# Patient Record
Sex: Male | Born: 1945 | Race: Black or African American | Hispanic: No | Smoking: Former smoker
Health system: Southern US, Community
[De-identification: ages and names within clinical notes are randomized; demographics above are authoritative.]

## PROBLEM LIST (undated history)

## (undated) DIAGNOSIS — I480 Paroxysmal atrial fibrillation: Secondary | ICD-10-CM

## (undated) DIAGNOSIS — I959 Hypotension, unspecified: Secondary | ICD-10-CM

## (undated) DIAGNOSIS — Z86718 Personal history of other venous thrombosis and embolism: Secondary | ICD-10-CM

## (undated) DIAGNOSIS — Z8719 Personal history of other diseases of the digestive system: Secondary | ICD-10-CM

## (undated) DIAGNOSIS — I739 Peripheral vascular disease, unspecified: Secondary | ICD-10-CM

## (undated) DIAGNOSIS — J9601 Acute respiratory failure with hypoxia: Secondary | ICD-10-CM

## (undated) DIAGNOSIS — Z9114 Patient's other noncompliance with medication regimen: Secondary | ICD-10-CM

## (undated) DIAGNOSIS — M2550 Pain in unspecified joint: Secondary | ICD-10-CM

## (undated) DIAGNOSIS — I4892 Unspecified atrial flutter: Secondary | ICD-10-CM

## (undated) DIAGNOSIS — I5022 Chronic systolic (congestive) heart failure: Secondary | ICD-10-CM

## (undated) DIAGNOSIS — I639 Cerebral infarction, unspecified: Secondary | ICD-10-CM

## (undated) DIAGNOSIS — H269 Unspecified cataract: Secondary | ICD-10-CM

## (undated) DIAGNOSIS — Z91148 Patient's other noncompliance with medication regimen for other reason: Secondary | ICD-10-CM

## (undated) DIAGNOSIS — N189 Chronic kidney disease, unspecified: Secondary | ICD-10-CM

## (undated) DIAGNOSIS — I219 Acute myocardial infarction, unspecified: Secondary | ICD-10-CM

## (undated) DIAGNOSIS — I82409 Acute embolism and thrombosis of unspecified deep veins of unspecified lower extremity: Secondary | ICD-10-CM

## (undated) DIAGNOSIS — IMO0001 Reserved for inherently not codable concepts without codable children: Secondary | ICD-10-CM

## (undated) DIAGNOSIS — G629 Polyneuropathy, unspecified: Secondary | ICD-10-CM

## (undated) DIAGNOSIS — I255 Ischemic cardiomyopathy: Secondary | ICD-10-CM

## (undated) DIAGNOSIS — I2510 Atherosclerotic heart disease of native coronary artery without angina pectoris: Secondary | ICD-10-CM

## (undated) DIAGNOSIS — J9602 Acute respiratory failure with hypercapnia: Secondary | ICD-10-CM

## (undated) DIAGNOSIS — E785 Hyperlipidemia, unspecified: Secondary | ICD-10-CM

## (undated) HISTORY — PX: OTHER SURGICAL HISTORY: SHX169

## (undated) HISTORY — DX: Chronic systolic (congestive) heart failure: I50.22

## (undated) HISTORY — DX: Acute myocardial infarction, unspecified: I21.9

## (undated) HISTORY — PX: ADENOIDECTOMY: SUR15

## (undated) HISTORY — PX: TONSILLECTOMY: SUR1361

## (undated) HISTORY — PX: HERNIA REPAIR: SHX51

## (undated) HISTORY — DX: Unspecified cataract: H26.9

## (undated) HISTORY — DX: Paroxysmal atrial fibrillation: I48.0

## (undated) HISTORY — PX: EYE SURGERY: SHX253

## (undated) HISTORY — PX: CARDIAC CATHETERIZATION: SHX172

## (undated) HISTORY — DX: Cerebral infarction, unspecified: I63.9

## (undated) HISTORY — DX: Hyperlipidemia, unspecified: E78.5

---

## 2000-03-08 HISTORY — PX: CORONARY ARTERY BYPASS GRAFT: SHX141

## 2000-04-08 HISTORY — PX: CORONARY ANGIOPLASTY: SHX604

## 2009-04-08 DIAGNOSIS — I82409 Acute embolism and thrombosis of unspecified deep veins of unspecified lower extremity: Secondary | ICD-10-CM

## 2009-04-08 HISTORY — DX: Acute embolism and thrombosis of unspecified deep veins of unspecified lower extremity: I82.409

## 2010-03-16 ENCOUNTER — Inpatient Hospital Stay (HOSPITAL_COMMUNITY)
Admission: EM | Admit: 2010-03-16 | Discharge: 2010-03-25 | Payer: Self-pay | Source: Home / Self Care | Attending: Internal Medicine | Admitting: Internal Medicine

## 2010-03-19 HISTORY — PX: CARDIAC CATHETERIZATION: SHX172

## 2010-03-28 ENCOUNTER — Ambulatory Visit: Payer: Self-pay | Admitting: Cardiology

## 2010-04-13 DIAGNOSIS — I2510 Atherosclerotic heart disease of native coronary artery without angina pectoris: Secondary | ICD-10-CM | POA: Insufficient documentation

## 2010-04-13 DIAGNOSIS — I480 Paroxysmal atrial fibrillation: Secondary | ICD-10-CM | POA: Insufficient documentation

## 2010-04-13 DIAGNOSIS — I428 Other cardiomyopathies: Secondary | ICD-10-CM | POA: Insufficient documentation

## 2010-04-16 ENCOUNTER — Encounter: Payer: Self-pay | Admitting: Cardiology

## 2010-04-16 ENCOUNTER — Encounter (INDEPENDENT_AMBULATORY_CARE_PROVIDER_SITE_OTHER): Payer: Self-pay | Admitting: *Deleted

## 2010-04-16 ENCOUNTER — Ambulatory Visit
Admission: RE | Admit: 2010-04-16 | Discharge: 2010-04-16 | Payer: Self-pay | Source: Home / Self Care | Attending: Cardiology | Admitting: Cardiology

## 2010-04-16 DIAGNOSIS — E119 Type 2 diabetes mellitus without complications: Secondary | ICD-10-CM | POA: Insufficient documentation

## 2010-04-16 DIAGNOSIS — M25569 Pain in unspecified knee: Secondary | ICD-10-CM | POA: Insufficient documentation

## 2010-04-25 ENCOUNTER — Ambulatory Visit: Admission: RE | Admit: 2010-04-25 | Discharge: 2010-04-25 | Payer: Self-pay | Source: Home / Self Care

## 2010-05-03 ENCOUNTER — Other Ambulatory Visit: Payer: Self-pay | Admitting: Internal Medicine

## 2010-05-03 ENCOUNTER — Ambulatory Visit
Admission: RE | Admit: 2010-05-03 | Discharge: 2010-05-03 | Payer: Self-pay | Source: Home / Self Care | Attending: Internal Medicine | Admitting: Internal Medicine

## 2010-05-03 DIAGNOSIS — E1165 Type 2 diabetes mellitus with hyperglycemia: Secondary | ICD-10-CM | POA: Insufficient documentation

## 2010-05-03 DIAGNOSIS — E118 Type 2 diabetes mellitus with unspecified complications: Secondary | ICD-10-CM

## 2010-05-03 LAB — BASIC METABOLIC PANEL
CO2: 26 mEq/L (ref 19–32)
Calcium: 9.4 mg/dL (ref 8.4–10.5)
Creatinine, Ser: 1.2 mg/dL (ref 0.4–1.5)
Glucose, Bld: 234 mg/dL — ABNORMAL HIGH (ref 70–99)

## 2010-05-03 NOTE — Discharge Summary (Addendum)
NAME:  Leroy [name], Leroy [name] NO.:  [id number]  MEDICAL RECORD NO.:  [id number]          PATIENT TYPE:  INP  LOCATION:  3728                         FACILITY:  [facility]  PHYSICIAN:  Rollene Rotunda, MD, FACCDATE OF BIRTH:  05-06-1945  DATE OF ADMISSION:  03/16/2010 DATE OF DISCHARGE:  03/25/2010                              DISCHARGE SUMMARY   PRIMARY CARDIOLOGIST:  Rollene Rotunda, MD, [facility]  PRIMARY CARE PHYSICIAN:  Currently none.  DISCHARGE DIAGNOSES: 1. Paroxysmal atrial fibrillation. 2. Chronic anticoagulation. 3. Coronary artery disease, status post coronary artery bypass     grafting approximately 10 years ago.     a.     Left internal mammary artery to left anterior descending,      saphenous vein graft to the obtuse marginal, saphenous vein graft      to right coronary artery.     b.     Cardiac catheterization on March 20, 2010, demonstrating      severe native coronary artery disease.  There are patent grafts to      the left anterior descending and circumflex artery.  The right      coronary artery occluded and fills the collateral vessels. 4. Ischemic cardiomyopathy, ejection fraction around 40% per     catheterization on March 20, 2010.  ALLERGIES:  KEFLEX causing hives.  PROCEDURES/DIAGNOSTICS PERFORMED DURING HOSPITALIZATION: 1. Left heart catheterization with coronary angiography.     a.     Severe native coronary artery disease.     b.     Patent grafts to the LAD and circumflex artery.     c.     Total occlusion of the right coronary artery that fills the      collateral vessels.  There is presumed graft to the RCA at one      point, but it is not occluded.     d.     Left ventriculogram demonstrating moderate left ventricular      dysfunction, EF 40%.  There is inferior wall akinesis.  No      significant mitral regurgitation. 2. Chest x-ray showing borderline cardiomegaly, no active lung     disease.  HISTORY OF PRESENT ILLNESS:  This  is a 65 year old gentleman who had recently been released from incarceration several months ago and has had no followup since this time.  He states he was in his usual state of health until he suddenly became diaphoretic and felt pressure in his chest.  This lasted for approximately an hour until he presented to the emergency department.  In the emergency department, his initial cardiac markers were elevated.  With the patient's history and elevated enzymes, the patient was admitted for non-ST-elevation myocardial infarction.  HOSPITAL COURSE:  The patient was placed on IV heparin, continued with aspirin, beta-blocker, and ACE inhibitor.  The patient's chest pain had resolved, but cardiac catheterization was indicated.  Risks, benefits, and indications were discussed with the patient and he agreed to proceed.  Dr. Elease Hashimoto took the patient to the Cath Lab on March 20, 2010, where informed consent was obtained.  As stated above, there was  severe native coronary artery disease with patent graft to the LAD and circumflex artery.  There was total occlusion of the RCA and presumed occlusion of the graft to the RCA.  Medical therapy was continued.  The patient had no further complaints of chest pain during hospitalization.  The patient did experience episodes of intermittent atrial fibrillation. He was continued on heparin as well as Coumadin until his INR was therapeutic.  The patient's INR was 2.08 on the day of discharge, he will be continued on chronic anticoagulation.  The patient will have followup in our office for INR draws.  The patient was noted to have ischemic cardiomyopathy per catheterization with an ejection fraction of 40%.  There was  inferior wall akinesis.  The patient was continued on aspirin, beta-blocker, and ACE inhibitors.  We are unable to titrate the Foley secondary to low blood pressures.  Medical compliance has been stressed to the patient, he  voices understanding.  His hemoglobin A1c was noted to be 12.0%.  The patient has had no followup since he was released from jail.  He is not checking his sugars regularly at home.  The patient's diabetes counselor did speak with the patient about the importance of diet as well as checking sugars regularly.  The patient has been given a number for [facility] as they are not taking new patients at this time.  He will call to make an appointment for followup of his diabetes.  On the day of discharge, the patient was in normal sinus rhythm without complaints of chest pain or shortness of breath.  His INR was therapeutic.  The patient was stable for home.  DISCHARGE LABS:  WBC 6, hemoglobin 13.8, hematocrit 40.7, and platelets 171.  INR 2.08.  Max troponin was 2.88.  DISCHARGE MEDICATIONS: 1. Aspirin 325 mg daily. 2. Doxycycline daily. 3. Crestor 40 mg daily. 4. Lasix 40 mg daily. 5. Lisinopril 20 mg daily. 6. Metoprolol tartrate 25 mg twice daily. 7. Nitroglycerin 0.4 mg sublingual as needed every 5 minutes up to 3     doses for chest pain. 8. Coumadin 5 mg use as directed. 9. Novolin N 30 units subcutaneously twice daily. 10.Novolin R 10 units subcutaneously twice daily.  FOLLOWUP PLAN: 1. The patient will follow up with Dr. Antoine Poche in the next 2-3 weeks,     our office will call to schedule this appointment. 2. The patient will follow up in the Coumadin Clinic in 4 days, our     office will call to schedule this appointment. 3. The patient will call to schedule an appointment with [facility]     for diabetes management. 4. The patient is to increase activity as tolerated.  He is to avoid     any activities that causes chest pain or shortness of breath. 5. The patient is to continue a low-sodium, heart-healthy, diabetic     diet. 6. The patient is to call our office for any problems.  DURATION OF DISCHARGE:  Greater than 30 minutes with physician and physician extender  time.     Leonette Monarch, PA-C   ______________________________ Rollene Rotunda, MD, [facility]    NB/MEDQ  D:  03/25/2010  T:  03/25/2010  Job:  [id number]  Electronically Signed by Alen Blew P.A. on 04/17/2010 07:47:03 PM Electronically Signed by Rollene Rotunda MD [facility] on 05/03/2010 12:15:47 PM

## 2010-05-09 ENCOUNTER — Ambulatory Visit: Admit: 2010-05-09 | Payer: Self-pay

## 2010-05-10 NOTE — Medication Information (Signed)
Summary: [email or web address]  Anticoagulant Therapy  Managed by: Geoffry Paradise, PharmD Referring MD: Su Monks MD: Shirlee Latch MD, Freida Busman Indication 1: Atrial Fibrillation INR RANGE 2-3  Dietary changes: no    Health status changes: no    Bleeding/hemorrhagic complications: no    Recent/future hospitalizations: no    Any changes in medication regimen? no    Recent/future dental: no  Any missed doses?: no       Is patient compliant with meds? yes      Comments: Patient recently discharged from the hospital (12/18) on coumadin 7.5 mg MWF and 10 mg other days of the week for atrial fibrillation.  Patient was previously on Coumadin for 7 years then discontinued for 1 week before restart at recent admission.    Current Medications (verified): 1)  Ecotrin 325 Mg Tbec (Aspirin) .... Once Daily 2)  Crestor 40 Mg Tabs (Rosuvastatin Calcium) .... Once Nightly 3)  Lasix 40 Mg Tabs (Furosemide) .... Once Daily in Am 4)  Prinivil 20 Mg Tabs (Lisinopril) .... Once Daily 5)  Lopressor 50 Mg Tabs (Metoprolol Tartrate) .[name].. 1 Tablet Twice Daily 6)  Coumadin 5 Mg Tabs (Warfarin Sodium) .... As Directed 7)  Novolin N 100 Unit/ml Susp (Insulin Isophane Human) .... 30 Units Twice Daily 8)  Novolin R 100 Unit/ml Soln (Insulin Regular Human) .[name].. 10 Units Twice Daily 9)  Nitrostat 0.4 Mg Subl (Nitroglycerin) .... As Directed  Allergies (verified): 1)  ! Cephalexin 2)  ! Norvasc  Anticoagulation Management History:      Negative risk factors for bleeding include an age less than 68 years old.  The bleeding index is 'low risk'.  Negative CHADS2 values include Age > 52 years old.  Anticoagulation responsible Caressa Scearce: Shirlee Latch MD, [place].  Cuvette Lot#: [id number].  Exp: 05/2011.    Anticoagulation Management Assessment/Plan:      The patient's current anticoagulation dose is Coumadin 5 mg tabs: as directed.  The target INR is 2.0-3.0.  The next INR is due 04/25/2010.  Results were reviewed/authorized by  Geoffry Paradise, PharmD.         Current Anticoagulation Instructions: INR:  2.8  Your INR is at goal today.  Please continue to take your coumadin at 7.5 mg MWF and 10 mg on other days of the week.  Return to clinic in 4 weeks for another INR check.   Prescriptions: NITROSTAT 0.4 MG SUBL (NITROGLYCERIN) as directed  #1 x 1   Entered by:   Geoffry Paradise PharmD   Authorized by:   [name] LB CHURCHDEFAULT   Signed by:   Geoffry Paradise PharmD on 03/28/2010   Method used:   Historical   RxID:   [id number] NOVOLIN R 100 UNIT/ML SOLN (INSULIN REGULAR HUMAN) 10 units twice daily  #1 x 1   Entered by:   Geoffry Paradise PharmD   Authorized by:   [name] LB CHURCHDEFAULT   Signed by:   Geoffry Paradise PharmD on 03/28/2010   Method used:   Historical   RxID:   [id number] NOVOLIN N 100 UNIT/ML SUSP (INSULIN ISOPHANE HUMAN) 30 units twice daily  #1 x 1   Entered by:   Geoffry Paradise PharmD   Authorized by:   [name] LB CHURCHDEFAULT   Signed by:   Geoffry Paradise PharmD on 03/28/2010   Method used:   Historical   RxID:   [id number] LOPRESSOR 50 MG TABS (METOPROLOL TARTRATE) 1 tablet twice daily  #1 x 1   Entered by:   Geoffry Paradise PharmD  Authorized by:   [name] LB CHURCHDEFAULT   Signed by:   Geoffry Paradise PharmD on 03/28/2010   Method used:   Historical   RxID:   [id number] PRINIVIL 20 MG TABS (LISINOPRIL) Once daily  #1 x 1   Entered by:   Geoffry Paradise PharmD   Authorized by:   [name] LB CHURCHDEFAULT   Signed by:   Geoffry Paradise PharmD on 03/28/2010   Method used:   Historical   RxID:   [id number] LASIX 40 MG TABS (FUROSEMIDE) Once daily in AM  #1 x 1   Entered by:   Geoffry Paradise PharmD   Authorized by:   [name] LB CHURCHDEFAULT   Signed by:   Geoffry Paradise PharmD on 03/28/2010   Method used:   Historical   RxID:   [id number] CRESTOR 40 MG TABS (ROSUVASTATIN CALCIUM) once nightly  #1 x 1   Entered by:   Geoffry Paradise PharmD   Authorized by:   [name] LB CHURCHDEFAULT   Signed by:   Geoffry Paradise PharmD  on 03/28/2010   Method used:   Historical   RxID:   [id number] ECOTRIN 325 MG TBEC (ASPIRIN) Once daily  #1 x 1   Entered by:   Geoffry Paradise PharmD   Authorized by:   [name] LB CHURCHDEFAULT   Signed by:   Geoffry Paradise PharmD on 03/28/2010   Method used:   Historical   RxID:   [id number]

## 2010-05-10 NOTE — Assessment & Plan Note (Signed)
Summary: NEW/ NO INSURANCE/ ASKED TO BRING $184/ NWS #   Vital Signs:  Patient profile:   65 year old male Height:      69 inches Weight:      311.50 pounds BMI:     46.17 O2 Sat:      97 % on Room air Temp:     98.6 degrees F oral Pulse rate:   70 / minute Pulse rhythm:   regular Resp:     16 per minute BP sitting:   94 / 68  (left arm) Cuff size:   large  Vitals Entered By: Rock Nephew CMA (May 03, 2010 10:29 AM)  Nutrition Counseling: Patient's BMI is greater than 25 and therefore counseled on weight management options.  O2 Flow:  Room air CC: New to establish Is Patient Diabetic? Yes Did you bring your meter with you today? No Pain Assessment Patient in pain? no       Does patient need assistance? Functional Status Self care Ambulation Normal   Primary Care Provider:  Etta Grandchild MD  CC:  New to establish.  History of Present Illness: New to me for evaluation of diabetes. He tells me that his A1C was about 11% six weeks ago when he was in the hospital. He has had DM for a long while and says that he has not been on oral meds b/c he has been in prison for 10 years and he says oral meds were not offered to him. All the info indicates that he has Type DM and would benefit from being on some oral meds in addition to insulin.  Preventive Screening-Counseling & Management  Alcohol-Tobacco     Alcohol drinks/day: 0     Alcohol Counseling: not indicated; patient does not drink     Smoking Status: quit > 6 months     Tobacco Counseling: to remain off tobacco products  Caffeine-Diet-Exercise     Does Patient Exercise: no  Hep-HIV-STD-Contraception     Hepatitis Risk: no risk noted     HIV Risk: no risk noted     STD Risk: no risk noted      Sexual History:  currently monogamous.        Drug Use:  no.        Blood Transfusions:  no.    Current Medications (verified): 1)  Ecotrin 325 Mg Tbec (Aspirin) .... Once Daily 2)  Crestor 40 Mg Tabs  (Rosuvastatin Calcium) .... Once Nightly 3)  Lasix 40 Mg Tabs (Furosemide) .... Once Daily in Am 4)  Prinivil 20 Mg Tabs (Lisinopril) .... Once Daily 5)  Toprol Xl 25 Mg Xr24h-Tab (Metoprolol Succinate) .[name].. 1 Po Two Times A Day 6)  Coumadin 5 Mg Tabs (Warfarin Sodium) .... As Directed 7)  Novolin N 100 Unit/ml Susp (Insulin Isophane Human) .... 30 Units Twice Daily 8)  Novolin R 100 Unit/ml Soln (Insulin Regular Human) .[name].. 10 Units Twice Daily 9)  Nitrostat 0.4 Mg Subl (Nitroglycerin) .... As Directed  Allergies: 1)  ! Cephalexin 2)  ! Norvasc 3)  ! Amlodipine Besylate  Past History:  Past Medical History: Last updated: 04/16/2010  1. Paroxysmal atrial fibrillation.   2. Chronic anticoagulation.   3. Coronary artery disease, status post coronary artery bypass       grafting approximately 10 years ago.       a.     Left internal mammary artery to left anterior descending,        saphenous vein graft  to the obtuse marginal, saphenous vein graft        to right coronary artery.       b.     Cardiac catheterization on March 20, 2010, demonstrating        severe native coronary artery disease.  There are patent grafts to        the left anterior descending and circumflex artery.  The right        coronary artery occluded and fills the collateral vessels.   4. Ischemic cardiomyopathy, ejection fraction around 40% per       catheterization on March 20, 2010.   5. Diabetes  Past Surgical History: Last updated: 04/16/2010 CABG Lower extremity revascularization (details not available)  Family History: Last updated: 04/13/2010  Notable for both parents having coronary artery   disease.  He does not know other siblings history.      Social History: Last updated: 05/03/2010  The patient has a history of tobacco abuse in the past.   He denies current smoking.  He denies alcohol abuse.  He has a history   of drug use.  He denies all that currently and appears well.       Occupation: disabled, recent incarceration for drug trafficking Single Alcohol use-no Drug use-no Regular exercise-no  Risk Factors: Alcohol Use: 0 (05/03/2010) Exercise: no (05/03/2010)  Risk Factors: Smoking Status: quit > 6 months (05/03/2010)  Social History:  The patient has a history of tobacco abuse in the past.   He denies current smoking.  He denies alcohol abuse.  He has a history   of drug use.  He denies all that currently and appears well.     Occupation: disabled, recent incarceration for drug trafficking Single Alcohol use-no Drug use-no Regular exercise-no Smoking Status:  quit > 6 months Hepatitis Risk:  no risk noted HIV Risk:  no risk noted STD Risk:  no risk noted Sexual History:  currently monogamous Blood Transfusions:  no Drug Use:  no Does Patient Exercise:  no  Review of Systems       The patient complains of weight gain.  The patient denies anorexia, fever, weight loss, chest pain, syncope, dyspnea on exertion, peripheral edema, prolonged cough, headaches, hemoptysis, abdominal pain, hematuria, depression, and enlarged lymph nodes.   Endo:  Denies cold intolerance, excessive hunger, excessive thirst, excessive urination, heat intolerance, polyuria, and weight change.  Physical Exam  General:  alert, well-developed, well-nourished, well-hydrated, appropriate dress, normal appearance, healthy-appearing, cooperative to examination, and overweight-appearing.   Head:  normocephalic and atraumatic.   Eyes:  vision grossly intact, pupils equal, and no injection.   Ears:  R ear normal and L ear normal.   Nose:  External nasal examination shows no deformity or inflammation. Nasal mucosa are pink and moist without lesions or exudates. Mouth:  Oral mucosa and oropharynx without lesions or exudates.  Teeth in good repair. Neck:  supple, full ROM, no masses, no thyromegaly, no thyroid nodules or tenderness, no JVD, normal carotid upstroke, and no carotid  bruits.   Lungs:  normal respiratory effort, no intercostal retractions, no accessory muscle use, normal breath sounds, no dullness, no fremitus, no crackles, and no wheezes.   Heart:  normal rate, regular rhythm, no murmur, no gallop, no rub, and no JVD.   Abdomen:  soft, non-tender, normal bowel sounds, no distention, no masses, no guarding, no rigidity, no rebound tenderness, no abdominal hernia, no inguinal hernia, no hepatomegaly, and no splenomegaly.   Msk:  No deformity or scoliosis noted of thoracic or lumbar spine.   Pulses:  R and L carotid,radial,femoral,dorsalis pedis and posterior tibial pulses are full and equal bilaterally Extremities:  No clubbing or cyanosis.trace left pedal edema and trace right pedal edema.   Neurologic:  No cranial nerve deficits noted. Station and gait are normal. Plantar reflexes are down-going bilaterally. DTRs are symmetrical throughout. Sensory, motor and coordinative functions appear intact. Skin:  turgor normal, color normal, no rashes, no suspicious lesions, no ecchymoses, no petechiae, no purpura, no ulcerations, and no edema.   Cervical Nodes:  no anterior cervical adenopathy.   Axillary Nodes:  no R axillary adenopathy and no L axillary adenopathy.   Psych:  Oriented X3, memory intact for recent and remote, normally interactive, good eye contact, not anxious appearing, not depressed appearing, not agitated, not suicidal, and not homicidal.    Diabetes Management Exam:    Foot Exam (with socks and/or shoes not present):       Sensory-Pinprick/Light touch:          Left medial foot (L-4): normal          Left dorsal foot (L-5): normal          Left lateral foot (S-1): normal          Right medial foot (L-4): normal          Right dorsal foot (L-5): normal          Right lateral foot (S-1): normal       Sensory-Monofilament:          Left foot: normal          Right foot: normal       Inspection:          Left foot: normal          Right foot:  normal       Nails:          Left foot: normal          Right foot: normal   Impression & Recommendations:  Problem # 1:  DIABETES MELLITUS, TYPE II, UNCONTROLLED (ICD-250.02) Assessment New  His updated medication list for this problem includes:    Ecotrin 325 Mg Tbec (Aspirin) ..... Once daily    Prinivil 20 Mg Tabs (Lisinopril) ..... Once daily    Novolin N 100 Unit/ml Susp (Insulin isophane human) .Marland KitchenMarland KitchenMarland KitchenMarland Kitchen 30 units twice daily    Novolin R 100 Unit/ml Soln (Insulin regular human) .Marland KitchenMarland KitchenMarland KitchenMarland Kitchen 10 units twice daily    Kombiglyze Xr 08-998 Mg Xr24h-tab (Saxagliptin-metformin) ..... One by mouth once daily for diabetes  Orders: Diabetic Clinic Referral (Diabetic) Ophthalmology Referral (Ophthalmology) Venipuncture [phone]) TLB-BMP (Basic Metabolic Panel-BMET) (80048-METABOL) TLB-A1C / Hgb A1C (Glycohemoglobin) (83036-A1C)  Complete Medication List: 1)  Ecotrin 325 Mg Tbec (Aspirin) .... Once daily 2)  Crestor 40 Mg Tabs (Rosuvastatin calcium) .... Once nightly 3)  Lasix 40 Mg Tabs (Furosemide) .... Once daily in am 4)  Prinivil 20 Mg Tabs (Lisinopril) .... Once daily 5)  Toprol Xl 25 Mg Xr24h-tab (Metoprolol succinate) .[name].. 1 po two times a day 6)  Coumadin 5 Mg Tabs (Warfarin sodium) .... As directed 7)  Novolin N 100 Unit/ml Susp (Insulin isophane human) .... 30 units twice daily 8)  Novolin R 100 Unit/ml Soln (Insulin regular human) .[name].. 10 units twice daily 9)  Nitrostat 0.4 Mg Subl (Nitroglycerin) .... As directed 10)  Kombiglyze Xr 08-998 Mg Xr24h-tab (Saxagliptin-metformin) .... One by mouth once daily for diabetes  11)  Bayer Contour Test Strp (Glucose blood) .... Use two times a day as directed 12)  Designer, multimedia W/device Kit (Blood glucose monitoring suppl) .... Use two times a day as directed  Other Orders: Tdap => 87yrs IM (04540) Admin 1st Vaccine (98119)  Patient Instructions: 1)  Please schedule a follow-up appointment in 3 months. 2)  It is important that you  exercise regularly at least 20 minutes 5 times a week. If you develop chest pain, have severe difficulty breathing, or feel very tired , stop exercising immediately and seek medical attention. 3)  You need to lose weight. Consider a lower calorie diet and regular exercise.  4)  Check your blood sugars regularly. If your readings are usually above 200 or below 70 you should contact our office. 5)  It is important that your Diabetic A1c level is checked every 3 months. 6)  See your eye doctor yearly to check for diabetic eye damage. 7)  Check your feet each night for sore areas, calluses or signs of infection. 8)  Check your Blood Pressure regularly. If it is above: you should make an appointment. Prescriptions: BAYER CONTOUR MONITOR W/DEVICE KIT (BLOOD GLUCOSE MONITORING SUPPL) Use two times a day as directed  #1 x 0   Entered and Authorized by:   Etta Grandchild MD   Signed by:   Etta Grandchild MD on 05/03/2010   Method used:   Samples Given   RxID:   [id number] BAYER CONTOUR TEST  STRP (GLUCOSE BLOOD) Use two times a day as directed  #250 x 0   Entered and Authorized by:   Etta Grandchild MD   Signed by:   Etta Grandchild MD on 05/03/2010   Method used:   Samples Given   RxID:   [id number] KOMBIGLYZE XR 08-998 MG XR24H-TAB (SAXAGLIPTIN-METFORMIN) One by mouth once daily for diabetes  #112 x 0   Entered and Authorized by:   Etta Grandchild MD   Signed by:   Etta Grandchild MD on 05/03/2010   Method used:   Samples Given   RxID:   [id number]    Orders Added: 1)  Diabetic Clinic Referral [Diabetic] 2)  Ophthalmology Referral [Ophthalmology] 3)  Venipuncture [42595] 4)  TLB-BMP (Basic Metabolic Panel-BMET) [80048-METABOL] 5)  TLB-A1C / Hgb A1C (Glycohemoglobin) [83036-A1C] 6)  Tdap => 39yrs IM [90715] 7)  Admin 1st Vaccine [90471] 8)  New Patient Level III [63875]   Immunization History:  Influenza Immunization History:    Influenza:  fluvax 3+  (03/17/2010)  Pneumovax Immunization History:    Pneumovax:  pneumovax (03/17/2010)  Immunizations Administered:  Tetanus Vaccine:    Vaccine Type: Tdap    Site: left deltoid    Mfr: GlaxoSmithKline    Dose: 0.5 ml    Route: IM    Given by: Lamar Sprinkles, CMA    Exp. Date: 01/26/2012    Lot #: [id number]    VIS given: 02/24/08 version given May 03, 2010.   Immunization History:  Influenza Immunization History:    Influenza:  Fluvax 3+ (03/17/2010)  Pneumovax Immunization History:    Pneumovax:  Pneumovax (03/17/2010)  Immunizations Administered:  Tetanus Vaccine:    Vaccine Type: Tdap    Site: left deltoid    Mfr: GlaxoSmithKline    Dose: 0.5 ml    Route: IM    Given by: Lamar Sprinkles, CMA    Exp. Date: 01/26/2012    Lot #: [id number]    VIS given:  02/24/08 version given May 03, 2010.

## 2010-05-10 NOTE — Letter (Signed)
Summary: Generic Letter  Architectural technologist, Main Office  [address]   [place], [place] [address]   Phone: [phone]  Fax: [phone]        April 16, 2010 MRN: [id number]    Leroy [name] [address] [place], [place]  [address]    Dear [name],    Dr. Antoine Poche has referred to the orthopedic doctor.  You have an appointment with Dr. Farris Has on 05/16/10 @ 11am. Please arrive at 10:45 to register.  The office is location is [address]. If you need to cancel or reschedule your appointment, please call [phone].       Sincerely,   Merita Norton Lloyd-Fate

## 2010-05-10 NOTE — Assessment & Plan Note (Signed)
Summary: [email or web address]   Visit Type:  Follow-up Primary Provider:  None  CC:  CAD and Atrial fibrillation.  History of Present Illness: The pateint presents for followup after recent hospitalization for management of chest pain and atrial fibrillation. Since that hospitalization he has been doing well. He denies any chest discomfort, neck or arm discomfort. He has been doing some yard work note he hasn't been exercising. He is somewhat limited by chronic knee pain. He has not noticed any palpitations, presyncope or syncope. He has had no new shortness of breath, PND or orthopnea. He has been checking his blood sugars and says that they are controlled.   Current Medications (verified): 1)  Ecotrin 325 Mg Tbec (Aspirin) .... Once Daily 2)  Crestor 40 Mg Tabs (Rosuvastatin Calcium) .... Once Nightly 3)  Lasix 40 Mg Tabs (Furosemide) .... Once Daily in Am 4)  Prinivil 20 Mg Tabs (Lisinopril) .... Once Daily 5)  Toprol Xl 25 Mg Xr24h-Tab (Metoprolol Succinate) .[name].. 1 Po Two Times A Day 6)  Coumadin 5 Mg Tabs (Warfarin Sodium) .... As Directed 7)  Novolin N 100 Unit/ml Susp (Insulin Isophane Human) .... 30 Units Twice Daily 8)  Novolin R 100 Unit/ml Soln (Insulin Regular Human) .[name].. 10 Units Twice Daily 9)  Nitrostat 0.4 Mg Subl (Nitroglycerin) .... As Directed  Allergies (verified): 1)  ! Cephalexin 2)  ! Norvasc  Past History:  Past Medical History:  1. Paroxysmal atrial fibrillation.   2. Chronic anticoagulation.   3. Coronary artery disease, status post coronary artery bypass       grafting approximately 10 years ago.       a.     Left internal mammary artery to left anterior descending,        saphenous vein graft to the obtuse marginal, saphenous vein graft        to right coronary artery.       b.     Cardiac catheterization on March 20, 2010, demonstrating        severe native coronary artery disease.  There are patent grafts to        the left anterior descending and circumflex  artery.  The right        coronary artery occluded and fills the collateral vessels.   4. Ischemic cardiomyopathy, ejection fraction around 40% per       catheterization on March 20, 2010.   5. Diabetes  Past Surgical History: CABG Lower extremity revascularization (details not available)  Review of Systems       As stated in the HPI and negative for all other systems.   Vital Signs:  Patient profile:   65 year old male Height:      69 inches Weight:      311 pounds BMI:     46.09 Pulse rate:   74 / minute Resp:     16 per minute BP sitting:   112 /   (right arm)  Vitals Entered By: Marrion Coy, CNA (April 16, 2010 11:16 AM)  Physical Exam  General:  Well developed, well nourished, in no acute distress. Head:  normocephalic and atraumatic Eyes:  PERRLA/EOM intact; conjunctiva and lids normal. Neck:  Neck supple, no JVD. No masses, thyromegaly or abnormal cervical nodes. Chest Wall:  Well-healed sternotomy scar Lungs:  Clear bilaterally to auscultation and percussion. Abdomen:  Bowel sounds positive; abdomen soft and non-tender without masses, organomegaly, or hernias noted. No hepatosplenomegaly, obese Msk:  Back normal, normal gait. Muscle  strength and tone normal. Extremities:  No clubbing or cyanosis. Neurologic:  Alert and oriented x 3. Skin:  Intact without lesions or rashes. Cervical Nodes:  no significant adenopathy Axillary Nodes:  no significant adenopathy Psych:  Normal affect.   Detailed Cardiovascular Exam  Neck    Carotids: Carotids full and equal bilaterally without bruits.      Neck Veins: Normal, no JVD.    Heart    Inspection: no deformities or lifts noted.      Palpation: normal PMI with no thrills palpable.      Auscultation: regular rate and rhythm, S1, S2 without murmurs, rubs, gallops, or clicks.    Vascular    Abdominal Aorta: no palpable masses, pulsations, or audible bruits.      Femoral Pulses: normal femoral pulses  bilaterally.      Pedal Pulses: normal pedal pulses bilaterally.      Radial Pulses: normal radial pulses bilaterally.      Peripheral Circulation: no clubbing, cyanosis, or edema noted with normal capillary refill.     EKG  Procedure date:  04/16/2010  Findings:      Sinus rhythm, rate 75, axis within normal limits, intervals within normal limits, lateral T wave inversions  Impression & Recommendations:  Problem # 1:  CAD (ICD-414.00) He is having no new symptoms and we will continue with risk reduction. Orders: EKG w/ Interpretation (93000)  Problem # 2:  KNEE PAIN (VWU-981.19) I have referred him for orthopedic consultation Orders: Orthopedic Referral (Ortho)  Problem # 3:  ATRIAL FIBRILLATION, PAROXYSMAL (ICD-427.31) He has had no symptomatic paroxysms. He will continue with anticoagulation given his elevated thromboembolic risk score. His updated medication list for this problem includes:    Ecotrin 325 Mg Tbec (Aspirin) ..... Once daily    Toprol Xl 25 Mg Xr24h-tab (Metoprolol succinate) .[name]... 1 po two times a day    Coumadin 5 Mg Tabs (Warfarin sodium) .[name]... As directed  Orders: EKG w/ Interpretation (93000) Misc. Referral (Misc. Ref)  Problem # 4:  CARDIOMYOPATHY (ICD-425.4) His blood pressure will not allow further med titration. He has class I symptoms at this point. Orders: Misc. Referral (Misc. Ref)  Problem # 5:  DM (ICD-250.00) We are working to get him established with a primary provider who accepts indigent care.  Patient Instructions: 1)  Your physician recommends that you schedule a follow-up appointment in: 6 months with Tereso Newcomer, PA 2)  Your physician recommends that you continue on your current medications as directed. Please refer to the Current Medication list given to you today. 3)  You have been referred to orthopedics for your right  knee pain and to primary care to be established.

## 2010-05-10 NOTE — Medication Information (Signed)
Summary: CCR/tp  Anticoagulant Therapy  Managed by: Geoffry Paradise, PharmD Referring MD: Antoine Poche PCP: None Supervising MD: Shirlee Latch MD, Essance Gatti Indication 1: Atrial Fibrillation INR POC 1.6 INR RANGE 2-3  Dietary changes: no    Health status changes: no    Bleeding/hemorrhagic complications: no    Recent/future hospitalizations: no    Any changes in medication regimen? no    Recent/future dental: no  Any missed doses?: no       Is patient compliant with meds? yes       Allergies: 1)  ! Cephalexin 2)  ! Norvasc  Anticoagulation Management History:      Positive risk factors for bleeding include presence of serious comorbidities.  Negative risk factors for bleeding include an age less than 48 years old.  The bleeding index is 'intermediate risk'.  Positive CHADS2 values include History of Diabetes.  Negative CHADS2 values include Age > 55 years old.  Anticoagulation responsible provider: Shirlee Latch MD, Micha Dosanjh.  INR POC: 1.6.  Cuvette Lot#: [id number].  Exp: 04/2011.    Anticoagulation Management Assessment/Plan:      The patient's current anticoagulation dose is Coumadin 5 mg tabs: as directed.  The target INR is 2.0-3.0.  The next INR is due 05/09/2010.  Results were reviewed/authorized by Geoffry Paradise, PharmD.         Prior Anticoagulation Instructions: INR:  2.8  Your INR is at goal today.  Please continue to take your coumadin at 7.5 mg MWF and 10 mg on other days of the week.  Return to clinic in 4 weeks for another INR check.    Current Anticoagulation Instructions: INR: 1.6  (goal 2-3)  Your INR is low today.  Take 2.5 tablets tonight and tomorrow night then resume your normal schedule of 2 tablets on Sunday, Tuesday, Thursday, Saturday and 1.5 tablets on other days of the week. Return to clinic in 2 weeks for another INR check.

## 2010-05-31 ENCOUNTER — Encounter: Payer: Self-pay | Admitting: Internal Medicine

## 2010-06-01 ENCOUNTER — Telehealth: Payer: Self-pay | Admitting: Internal Medicine

## 2010-06-05 NOTE — Progress Notes (Signed)
      Diabetes Management Exam:    Eye Exam:       Eye Exam done elsewhere          Date: 05/31/2010          Results: diabetic retinopathy          Done by: Randon Goldsmith

## 2010-06-14 NOTE — Letter (Signed)
Summary: [facility]   Imported By: Sherian Rein 06/05/2010 13:55:48  _____________________________________________________________________  External Attachment:    Type:   Image     Comment:   External Document

## 2010-06-18 LAB — CBC
HCT: 40.7 % (ref 39.0–52.0)
HCT: 41.5 % (ref 39.0–52.0)
Hemoglobin: 13.8 g/dL (ref 13.0–17.0)
Hemoglobin: 14.2 g/dL (ref 13.0–17.0)
MCH: 29.9 pg (ref 26.0–34.0)
MCH: 30.3 pg (ref 26.0–34.0)
MCH: 30.3 pg (ref 26.0–34.0)
MCHC: 33.3 g/dL (ref 30.0–36.0)
MCHC: 34.2 g/dL (ref 30.0–36.0)
MCV: 88.7 fL (ref 78.0–100.0)
MCV: 89.2 fL (ref 78.0–100.0)
MCV: 89.5 fL (ref 78.0–100.0)
Platelets: 171 10*3/uL (ref 150–400)
RBC: 4.61 MIL/uL (ref 4.22–5.81)
RDW: 13.8 % (ref 11.5–15.5)
RDW: 13.9 % (ref 11.5–15.5)
WBC: 6 10*3/uL (ref 4.0–10.5)
WBC: 6.4 10*3/uL (ref 4.0–10.5)

## 2010-06-18 LAB — GLUCOSE, CAPILLARY
Glucose-Capillary: 103 mg/dL — ABNORMAL HIGH (ref 70–99)
Glucose-Capillary: 104 mg/dL — ABNORMAL HIGH (ref 70–99)
Glucose-Capillary: 134 mg/dL — ABNORMAL HIGH (ref 70–99)
Glucose-Capillary: 144 mg/dL — ABNORMAL HIGH (ref 70–99)
Glucose-Capillary: 171 mg/dL — ABNORMAL HIGH (ref 70–99)
Glucose-Capillary: 176 mg/dL — ABNORMAL HIGH (ref 70–99)

## 2010-06-18 LAB — HEPARIN LEVEL (UNFRACTIONATED)
Heparin Unfractionated: 0.38 IU/mL (ref 0.30–0.70)
Heparin Unfractionated: 0.45 IU/mL (ref 0.30–0.70)

## 2010-06-18 LAB — PROTIME-INR
INR: 1.74 — ABNORMAL HIGH (ref 0.00–1.49)
INR: 2.08 — ABNORMAL HIGH (ref 0.00–1.49)
Prothrombin Time: 19.4 seconds — ABNORMAL HIGH (ref 11.6–15.2)

## 2010-06-19 LAB — CARDIAC PANEL(CRET KIN+CKTOT+MB+TROPI)
CK, MB: 12.2 ng/mL (ref 0.3–4.0)
Relative Index: 5 — ABNORMAL HIGH (ref 0.0–2.5)
Relative Index: 5 — ABNORMAL HIGH (ref 0.0–2.5)
Total CK: 245 U/L — ABNORMAL HIGH (ref 7–232)

## 2010-06-19 LAB — GLUCOSE, CAPILLARY
Glucose-Capillary: 101 mg/dL — ABNORMAL HIGH (ref 70–99)
Glucose-Capillary: 118 mg/dL — ABNORMAL HIGH (ref 70–99)
Glucose-Capillary: 145 mg/dL — ABNORMAL HIGH (ref 70–99)
Glucose-Capillary: 152 mg/dL — ABNORMAL HIGH (ref 70–99)
Glucose-Capillary: 184 mg/dL — ABNORMAL HIGH (ref 70–99)
Glucose-Capillary: 201 mg/dL — ABNORMAL HIGH (ref 70–99)
Glucose-Capillary: 226 mg/dL — ABNORMAL HIGH (ref 70–99)
Glucose-Capillary: 259 mg/dL — ABNORMAL HIGH (ref 70–99)
Glucose-Capillary: 287 mg/dL — ABNORMAL HIGH (ref 70–99)
Glucose-Capillary: 304 mg/dL — ABNORMAL HIGH (ref 70–99)
Glucose-Capillary: 310 mg/dL — ABNORMAL HIGH (ref 70–99)
Glucose-Capillary: 312 mg/dL — ABNORMAL HIGH (ref 70–99)

## 2010-06-19 LAB — CBC
HCT: 41.3 % (ref 39.0–52.0)
HCT: 47.8 % (ref 39.0–52.0)
Hemoglobin: 13.9 g/dL (ref 13.0–17.0)
Hemoglobin: 14.1 g/dL (ref 13.0–17.0)
Hemoglobin: 14.3 g/dL (ref 13.0–17.0)
Hemoglobin: 16.6 g/dL (ref 13.0–17.0)
MCH: 29.6 pg (ref 26.0–34.0)
MCH: 29.6 pg (ref 26.0–34.0)
MCH: 29.7 pg (ref 26.0–34.0)
MCH: 30.7 pg (ref 26.0–34.0)
MCV: 87.8 fL (ref 78.0–100.0)
MCV: 88.2 fL (ref 78.0–100.0)
MCV: 88.5 fL (ref 78.0–100.0)
Platelets: 168 10*3/uL (ref 150–400)
RBC: 4.7 MIL/uL (ref 4.22–5.81)
RBC: 4.77 MIL/uL (ref 4.22–5.81)
RBC: 4.83 MIL/uL (ref 4.22–5.81)
RBC: 5.41 MIL/uL (ref 4.22–5.81)
RDW: 13.5 % (ref 11.5–15.5)
RDW: 13.7 % (ref 11.5–15.5)
WBC: 6.5 10*3/uL (ref 4.0–10.5)

## 2010-06-19 LAB — BASIC METABOLIC PANEL
BUN: 11 mg/dL (ref 6–23)
CO2: 24 mEq/L (ref 19–32)
Calcium: 9 mg/dL (ref 8.4–10.5)
Chloride: 103 mEq/L (ref 96–112)
GFR calc Af Amer: >60 mL/min (ref >60)
GFR calc non Af Amer: 52 mL/min — ABNORMAL LOW (ref >60)
Potassium: 4.3 mEq/L (ref 3.5–5.1)
Potassium: 4.5 mEq/L (ref 3.5–5.1)
Sodium: 130 mEq/L — ABNORMAL LOW (ref 135–145)

## 2010-06-19 LAB — DIFFERENTIAL
Lymphs Abs: 1.5 10*3/uL (ref 0.7–4.0)
Monocytes Absolute: 0.6 10*3/uL (ref 0.1–1.0)
Monocytes Relative: 10 % (ref 3–12)
Neutro Abs: 3.7 10*3/uL (ref 1.7–7.7)
Neutrophils Relative %: 63 % (ref 43–77)

## 2010-06-19 LAB — URINALYSIS, ROUTINE W REFLEX MICROSCOPIC
Bilirubin Urine: NEGATIVE
Ketones, ur: 15 mg/dL — AB
Protein, ur: 30 mg/dL — AB
Urobilinogen, UA: 1 mg/dL (ref 0.0–1.0)

## 2010-06-19 LAB — POCT CARDIAC MARKERS
CKMB, poc: 12.1 ng/mL (ref 1.0–8.0)
Myoglobin, poc: >500 ng/mL (ref 12–200)

## 2010-06-19 LAB — PROTIME-INR
INR: 0.91 (ref 0.00–1.49)
Prothrombin Time: 13.2 seconds (ref 11.6–15.2)

## 2010-06-19 LAB — TROPONIN I: Troponin I: 1.71 ng/mL (ref 0.00–0.06)

## 2010-06-19 LAB — HEPARIN LEVEL (UNFRACTIONATED)
Heparin Unfractionated: 0.24 IU/mL — ABNORMAL LOW (ref 0.30–0.70)
Heparin Unfractionated: 0.29 IU/mL — ABNORMAL LOW (ref 0.30–0.70)
Heparin Unfractionated: 0.44 IU/mL (ref 0.30–0.70)
Heparin Unfractionated: 0.5 IU/mL (ref 0.30–0.70)

## 2010-06-19 LAB — CK TOTAL AND CKMB (NOT AT ARMC)
CK, MB: 18.8 ng/mL (ref 0.3–4.0)
Relative Index: 5.3 — ABNORMAL HIGH (ref 0.0–2.5)
Total CK: 355 U/L — ABNORMAL HIGH (ref 7–232)

## 2010-06-19 LAB — LIPID PANEL
Cholesterol: 251 mg/dL — ABNORMAL HIGH (ref 0–200)
HDL: 40 mg/dL (ref >39)
Triglycerides: 146 mg/dL (ref <150)

## 2010-06-19 LAB — URINE MICROSCOPIC-ADD ON

## 2010-06-19 LAB — APTT: aPTT: 25 seconds (ref 24–37)

## 2011-12-26 LAB — HM DIABETES EYE EXAM

## 2012-01-02 ENCOUNTER — Other Ambulatory Visit (INDEPENDENT_AMBULATORY_CARE_PROVIDER_SITE_OTHER): Payer: Self-pay

## 2012-01-02 ENCOUNTER — Ambulatory Visit (INDEPENDENT_AMBULATORY_CARE_PROVIDER_SITE_OTHER): Payer: Self-pay | Admitting: Internal Medicine

## 2012-01-02 ENCOUNTER — Encounter: Payer: Self-pay | Admitting: Internal Medicine

## 2012-01-02 VITALS — BP 116/70 | HR 99 | Temp 97.3°F | Resp 16 | Wt 300.5 lb

## 2012-01-02 DIAGNOSIS — IMO0001 Reserved for inherently not codable concepts without codable children: Secondary | ICD-10-CM

## 2012-01-02 DIAGNOSIS — E785 Hyperlipidemia, unspecified: Secondary | ICD-10-CM | POA: Insufficient documentation

## 2012-01-02 DIAGNOSIS — E7849 Other hyperlipidemia: Secondary | ICD-10-CM

## 2012-01-02 DIAGNOSIS — I2510 Atherosclerotic heart disease of native coronary artery without angina pectoris: Secondary | ICD-10-CM

## 2012-01-02 DIAGNOSIS — I4891 Unspecified atrial fibrillation: Secondary | ICD-10-CM

## 2012-01-02 DIAGNOSIS — Z23 Encounter for immunization: Secondary | ICD-10-CM

## 2012-01-02 DIAGNOSIS — B351 Tinea unguium: Secondary | ICD-10-CM

## 2012-01-02 DIAGNOSIS — I428 Other cardiomyopathies: Secondary | ICD-10-CM

## 2012-01-02 LAB — URINALYSIS, ROUTINE W REFLEX MICROSCOPIC
Bilirubin Urine: NEGATIVE
Nitrite: NEGATIVE
Specific Gravity, Urine: 1.02 (ref 1.000–1.030)
Total Protein, Urine: 30
pH: 5.5 (ref 5.0–8.0)

## 2012-01-02 LAB — COMPREHENSIVE METABOLIC PANEL
ALT: 22 U/L (ref 0–53)
Albumin: 3 g/dL — ABNORMAL LOW (ref 3.5–5.2)
BUN: 17 mg/dL (ref 6–23)
CO2: 24 mEq/L (ref 19–32)
Creatinine, Ser: 1.4 mg/dL (ref 0.4–1.5)
Glucose, Bld: 441 mg/dL — ABNORMAL HIGH (ref 70–99)
Total Bilirubin: 0.5 mg/dL (ref 0.3–1.2)
Total Protein: 6.4 g/dL (ref 6.0–8.3)

## 2012-01-02 LAB — LIPID PANEL
Cholesterol: 263 mg/dL — ABNORMAL HIGH (ref 0–200)
HDL: 42.6 mg/dL (ref >39.00)
Triglycerides: 172 mg/dL — ABNORMAL HIGH (ref 0.0–149.0)

## 2012-01-02 LAB — CBC WITH DIFFERENTIAL/PLATELET
Basophils Absolute: 0 10*3/uL (ref 0.0–0.1)
Basophils Relative: 0.5 % (ref 0.0–3.0)
Eosinophils Absolute: 0.1 10*3/uL (ref 0.0–0.7)
MCHC: 33.2 g/dL (ref 30.0–36.0)
MCV: 93.5 fl (ref 78.0–100.0)
Monocytes Absolute: 0.7 10*3/uL (ref 0.1–1.0)
Neutrophils Relative %: 58 % (ref 43.0–77.0)
Platelets: 165 10*3/uL (ref 150.0–400.0)
RBC: 4.96 Mil/uL (ref 4.22–5.81)
RDW: 13 % (ref 11.5–14.6)

## 2012-01-02 LAB — HEMOGLOBIN A1C: Hgb A1c MFr Bld: 14.8 % — ABNORMAL HIGH (ref 4.6–6.5)

## 2012-01-02 MED ORDER — ROSUVASTATIN CALCIUM 20 MG PO TABS: 20.0000 mg | ORAL_TABLET | Freq: Every day | ORAL | Status: DC

## 2012-01-02 MED ORDER — SAXAGLIPTIN-METFORMIN ER 2.5-1000 MG PO TB24: 1.0000 | ORAL_TABLET | Freq: Two times a day (BID) | ORAL | Status: DC

## 2012-01-02 MED ORDER — INSULIN GLARGINE 100 UNIT/ML ~~LOC~~ SOLN: 50.0000 [IU] | Freq: Two times a day (BID) | SUBCUTANEOUS | Status: DC

## 2012-01-02 NOTE — Progress Notes (Signed)
Subjective:    Patient ID: Leroy [name], male    DOB: July 25, 1945, 66 y.o.   MRN: [id number]  Diabetes He presents for his follow-up diabetic visit. He has type 2 diabetes mellitus. His disease course has been worsening. There are no hypoglycemic associated symptoms. Pertinent negatives for hypoglycemia include no dizziness, headaches, seizures, speech difficulty or tremors. Associated symptoms include blurred vision, foot paresthesias, polydipsia, polyphagia and polyuria. Pertinent negatives for diabetes include no chest pain, no fatigue, no foot ulcerations, no visual change, no weakness and no weight loss. There are no hypoglycemic complications. Symptoms are worsening. Diabetic complications include retinopathy. Current diabetic treatment includes intensive insulin program and insulin injections. His weight is increasing steadily. He is following a generally unhealthy diet. When asked about meal planning, he reported none. He has not had a previous visit with a dietician. He never participates in exercise. His home blood glucose trend is increasing steadily. His breakfast blood glucose range is generally >200 mg/dl. His lunch blood glucose range is generally >200 mg/dl. His dinner blood glucose range is generally >200 mg/dl. His highest blood glucose is >200 mg/dl. His overall blood glucose range is >200 mg/dl. An ACE inhibitor/angiotensin II receptor blocker is not being taken. He does not see a podiatrist.Eye exam is current.      Review of Systems  Constitutional: Negative for fever, chills, weight loss, diaphoresis, activity change, appetite change, fatigue and unexpected weight change.  HENT: Negative.   Eyes: Positive for blurred vision.  Respiratory: Positive for shortness of breath (chronic, unchanged). Negative for apnea, cough, choking, chest tightness, wheezing and stridor.   Cardiovascular: Negative for chest pain, palpitations and leg swelling.  Gastrointestinal: Negative.     Genitourinary: Positive for polyuria and frequency. Negative for dysuria, urgency, flank pain, decreased urine volume, discharge, penile swelling, scrotal swelling, enuresis, difficulty urinating, genital sores, penile pain and testicular pain.  Musculoskeletal: Negative.   Skin: Negative.   Neurological: Negative for dizziness, tremors, seizures, syncope, facial asymmetry, speech difficulty, weakness, light-headedness, numbness and headaches.  Hematological: Positive for polydipsia and polyphagia. Negative for adenopathy. Does not bruise/bleed easily.  Psychiatric/Behavioral: Negative.        Objective:   Physical Exam  Vitals reviewed. Constitutional: He is oriented to person, place, and time. He appears well-developed and well-nourished.  Non-toxic appearance. He does not have a sickly appearance. He does not appear ill. No distress.  HENT:  Head: Normocephalic and atraumatic.  Mouth/Throat: Oropharynx is clear and moist. No oropharyngeal exudate.  Eyes: Conjunctivae normal are normal. Right eye exhibits no discharge. Left eye exhibits no discharge. No scleral icterus.  Neck: Normal range of motion. Neck supple. No JVD present. No tracheal deviation present. No thyromegaly present.  Cardiovascular: Normal rate, regular rhythm, normal heart sounds and intact distal pulses.  Exam reveals no gallop and no friction rub.   No murmur heard. Pulmonary/Chest: Effort normal and breath sounds normal. No stridor. No respiratory distress. He has no wheezes. He has no rales. He exhibits no tenderness.  Abdominal: Soft. Bowel sounds are normal. He exhibits no distension and no mass. There is no tenderness. There is no rebound and no guarding.  Musculoskeletal: Normal range of motion. He exhibits edema (1+ edema in BLE). He exhibits no tenderness.  Lymphadenopathy:    He has no cervical adenopathy.  Neurological: He is oriented to person, place, and time.  Skin: Skin is warm, dry and intact. No  abrasion, no bruising, no burn, no ecchymosis, no lesion, no petechiae and  no rash noted. He is not diaphoretic. No erythema. No pallor.       All toenails are thick with subungual debris.  He has diffuse severe xerosis.  Psychiatric: He has a normal mood and affect. His behavior is normal. Judgment and thought content normal.      Lab Results  Component Value Date   WBC 6.0 03/25/2010   HGB 13.8 03/25/2010   HCT 40.7 03/25/2010   PLT 171 03/25/2010   GLUCOSE 234* 05/03/2010   CHOL  Value: 251        ATP III CLASSIFICATION:  <200     mg/dL   Desirable  960-454  mg/dL   Borderline High  >=098    mg/dL   High       * 11/91/4782   TRIG 146 03/17/2010   HDL 40 03/17/2010   LDLCALC  Value: 182        Total Cholesterol/HDL:CHD Risk Coronary Heart Disease Risk Table                     Men   Women  1/2 Average Risk   3.4   3.3  Average Risk       5.0   4.4  2 X Average Risk   9.6   7.1  3 X Average Risk  23.4   11.0        Use the calculated Patient Ratio above and the CHD Risk Table to determine the patient's CHD Risk.        ATP III CLASSIFICATION (LDL):  <100     mg/dL   Optimal  956-213  mg/dL   Near or Above                    Optimal  130-159  mg/dL   Borderline  086-578  mg/dL   High  >469     mg/dL   Very High* 62/95/2841   NA 137 05/03/2010   K 4.3 05/03/2010   CL 103 05/03/2010   CREATININE 1.2 05/03/2010   BUN 16 05/03/2010   CO2 26 05/03/2010   INR 1.6 04/25/2010   HGBA1C 11.0* 05/03/2010      Assessment & Plan:

## 2012-01-02 NOTE — Patient Instructions (Signed)

## 2012-01-02 NOTE — Assessment & Plan Note (Signed)
He will not take a statin 

## 2012-01-02 NOTE — Assessment & Plan Note (Signed)
He has been getting insulin 70/30 otc and giving himself 50 u BID yet his blood sugars have stayed above 200, I have asked him to switch to lantus and to start kombiglyze, today I will check his a1c and his renal function

## 2012-01-02 NOTE — Assessment & Plan Note (Signed)
Podiatry referral

## 2012-01-02 NOTE — Assessment & Plan Note (Signed)
He needs cardiology f/up

## 2012-01-05 ENCOUNTER — Encounter: Payer: Self-pay | Admitting: Internal Medicine

## 2012-01-17 ENCOUNTER — Encounter: Payer: Self-pay | Admitting: Dietician

## 2012-01-17 ENCOUNTER — Encounter: Payer: Medicare Other | Attending: Internal Medicine | Admitting: Dietician

## 2012-01-17 VITALS — Ht 69.0 in | Wt 286.6 lb

## 2012-01-17 DIAGNOSIS — Z713 Dietary counseling and surveillance: Secondary | ICD-10-CM | POA: Insufficient documentation

## 2012-01-17 DIAGNOSIS — E119 Type 2 diabetes mellitus without complications: Secondary | ICD-10-CM | POA: Insufficient documentation

## 2012-01-17 NOTE — Progress Notes (Signed)
Medical Nutrition Therapy:  Appt start time: 1000   End time: 1130   Assessment: Primary concerns today: Getting my glucose under control so that I can have cataract surgery.  Has a history of type 2 diabetes for 35 years.  Has not consistently taken care of his diabetes.  History of multiple heart attacks and CABG.  Was at one time taking Coumadin for the blood clots.  Currently is not taking any of his medications except his Lantus insulin and the Saxagliptin/Metformin due to cost.  Notes he has cataracts in both eyes and is scheduled to have the RT cataract surgery when he gets his glucose under control.  Today he is complains that he feels that he has rocks in his shoes, this hurts and makes walking uncomfortable.  Notes that he has RT knee pain and once he gets his eyes taken care of her will address the pain in his knee. Has had previous DM education when in prison.  Currently he is starting back to read food labels and trying to eat the majority of his meals at home.  Since his MD appointment in September, he has lost 13.4 lb.   BLOOD GLUCOSE MONITORING:Has started back to monitoring.  Using a One Touch Ultra.  Notes he has a good supply of strips.  Fasting: 161,09,604   HYPERGLYCEMIA:  Notes that he is having few of the S/S of high blood glucose with changing his diet and taking the insulij  HYPOGLYCEMIA:  Denies any of the S/S of low blood glucose. (I did caution him that if her removes the Kool-aid and Sweetened Tea from his diet along with moderating his starch servings, he Leydi Winstead experience some lows.)  MEDICATIONS:Medication review completed.  Lantus is at 50 units two times a day and Saxagliptin/Metformin 2.08/998 mg is twice daily.  FOOT INSPECTION: Reports almost daily inspection.  Has extremely dry skin and large amount of callused material on his feet ([facility] of foot care)  DILATED EYE EXAM: Reports having seen the physician and is about getting glucose in control for cataract  surgery.  DIETARY INTAKE:   Usual eating pattern includes 3 meals and cutting snacks off.   24-hr recall:  B ( AM): 6;30  Eggs (3) scrambled with cheese, grits (1 cup or little more), sausage (patty 3)  Milk 2%, 16 oz.    Snk ( AM): Trying to not snack L ( PM): 1:00-1:30  Bratwrst dogs 8 inch, with buns with mustard, with  24 oz. sweet tea and lemon, fries medium. Snk ( PM): trying to stop D ( PM):  Cream potatoes 1.5 cup, cubed steak 8 oz, with gravy 1/3 cup and onions, Kool-aid 16 oz.  Snk ( PM): none  Beverages: milk, Kool-aid, sweet tea.     Usual physical activity: Walking about for ADL.  Has no planned exercise due to knee pain.  (Recommended investigating the possibility of membership at the [facility] wit Silver Sneakers and consider walking in the water at the pool or using a recumbent bike)    Estimated energy needs:  HT: 69 in  WT: 286.6 lb   BMI: 42.4 kg/m2  Adj  WT:  208 lb (95 kg) 1800-9000 calories    205-210 g carbohydrates  135-140 g protein  50-52 g fat   Progress Towards Goal(s): In progress.  Primary Goal:  To get fasting blood glucose to 120-140 mg.  To get A1C to as close to 7.0% or less as possible.  Nutritional Diagnosis:  [place]-2.1  Impaired nutrient utilization as related to glucose metabolism as evidenced by Diagnosis of Type 2 diabetes with recent A1C of 14.8%, and fasting blood glucose of 150-160 at times.  Intervention/Goals:  Review of the basic interactions in the body with diabetes and the need to control glucose to limit the long term complications.  Review of the normal blood glucose levels and the advantages of BGM.  Advised him to check after meals to see the effects of a high carb meal or fatty meal on his blood glucose.  Review of the food groups and their effects on blood glucose.  Review of carb foods, reading the food label, measuring portions, and carb counting.  Recommended trying to stay at 60-75 gm of Carb per meal.  If he continues the large meals he is  currently eating, continue to omit daily snacks.  Recommended using the www.calorieking.com web site for carb counting.  Handouts given during visit include:  Living Well with Diabetes  Controlling Blood Glucose  Foot Care for People with Diabetes  Yellow Card with diet prescription and exchange list.  Counting Carbohydrates booklet by Novonordisk.  Menu suggestions for 60-75 gm CHO per meal  Snack list.  Blood glucose log book.  Monitoring/Evaluation: Dietary intake, exercise, blood glucose levels, and body weight in 8-12 weeks.  To call to make appointment or with questions.

## 2012-01-21 ENCOUNTER — Ambulatory Visit (INDEPENDENT_AMBULATORY_CARE_PROVIDER_SITE_OTHER): Payer: Medicare Other | Admitting: Cardiology

## 2012-01-21 ENCOUNTER — Encounter: Payer: Self-pay | Admitting: Cardiology

## 2012-01-21 VITALS — BP 130/85 | HR 97 | Ht 69.0 in | Wt 298.6 lb

## 2012-01-21 DIAGNOSIS — IMO0001 Reserved for inherently not codable concepts without codable children: Secondary | ICD-10-CM

## 2012-01-21 DIAGNOSIS — I2510 Atherosclerotic heart disease of native coronary artery without angina pectoris: Secondary | ICD-10-CM

## 2012-01-21 DIAGNOSIS — I428 Other cardiomyopathies: Secondary | ICD-10-CM

## 2012-01-21 DIAGNOSIS — I4891 Unspecified atrial fibrillation: Secondary | ICD-10-CM

## 2012-01-21 DIAGNOSIS — E7849 Other hyperlipidemia: Secondary | ICD-10-CM

## 2012-01-21 DIAGNOSIS — E785 Hyperlipidemia, unspecified: Secondary | ICD-10-CM

## 2012-01-21 MED ORDER — NITROGLYCERIN 0.4 MG SL SUBL: 0.4000 mg | SUBLINGUAL_TABLET | SUBLINGUAL | Status: DC | PRN

## 2012-01-21 MED ORDER — METOPROLOL SUCCINATE ER 25 MG PO TB24: 25.0000 mg | ORAL_TABLET | Freq: Every day | ORAL | Status: DC

## 2012-01-21 MED ORDER — LISINOPRIL 10 MG PO TABS: 10.0000 mg | ORAL_TABLET | Freq: Every day | ORAL | Status: DC

## 2012-01-21 NOTE — Progress Notes (Signed)
HPI The patient presents for followup after being gone for over a year and a half. He could not afford his medications with followup. He now has Medicare. He's not been taking his medications. He has been trying to watch his diet and has lost some weight. He does not describe any chest pressure, neck or arm discomfort. He does not describe any palpitations, presyncope or syncope. He has no PND or orthopnea. He's had weight loss and no edema.  Allergies  Allergen Reactions  . Statins     Muscle aches  . Amlodipine Besylate   . Cephalexin     REACTION: Hives    Current Outpatient Prescriptions  Medication Sig Dispense Refill  . aspirin 325 MG EC tablet Take 325 mg by mouth daily.      . Blood Glucose Monitoring Suppl (CONTOUR BLOOD GLUCOSE SYSTEM) DEVI by Does not apply route.      . furosemide (LASIX) 40 MG tablet Take 40 mg by mouth daily.      [name] glucose blood (BAYER CONTOUR TEST) test strip 1 each by Other route 2 (two) times daily. Use as instructed      . insulin glargine (LANTUS SOLOSTAR) 100 UNIT/ML injection Inject 50 Units into the skin 2 (two) times daily.  10 pen  0  . lisinopril (PRINIVIL,ZESTRIL) 20 MG tablet Take 20 mg by mouth daily.      . metoprolol succinate (TOPROL-XL) 25 MG 24 hr tablet Take 25 mg by mouth 2 (two) times daily.      . nitroGLYCERIN (NITROSTAT) 0.4 MG SL tablet Place 0.4 mg under the tongue every 5 (five) minutes as needed.      . rosuvastatin (CRESTOR) 40 MG tablet Take 40 mg by mouth daily.      . Saxagliptin-Metformin 2.08-998 MG TB24 Take 1 tablet by mouth 2 (two) times daily.  120 tablet  0  . warfarin (COUMADIN) 5 MG tablet Take 5 mg by mouth as directed.        Past Medical History  Diagnosis Date  . Hyperlipidemia   . Diabetes mellitus   . CHF (congestive heart failure)   . Paroxysmal atrial fibrillation   . Chronic anticoagulation   . Myocardial infarction   . Cataract     Past Surgical History  Procedure Date  . Coronary artery  bypass graft   . Revasculariztion     lower extremity    ROS:    PHYSICAL EXAM BP 130/85  Pulse 97  Ht 5\' 9"  (1.753 m)  Wt 298 lb 9.6 oz (135.444 kg)  BMI 44.10 kg/m2 GENERAL:  Well appearing HEENT:  Pupils equal round and reactive, fundi not visualized, oral mucosa unremarkable NECK:  No jugular venous distention, waveform within normal limits, carotid upstroke brisk and symmetric, no bruits, no thyromegaly LYMPHATICS:  No cervical, inguinal adenopathy LUNGS:  Clear to auscultation bilaterally BACK:  No CVA tenderness CHEST:  Unremarkable HEART:  PMI not displaced or sustained,S1 and S2 within normal limits, no S3, no S4, no clicks, no rubs, no murmurs ABD:  Flat, positive bowel sounds normal in frequency in pitch, no bruits, no rebound, no guarding, no midline pulsatile mass, no hepatomegaly, no splenomegaly EXT:  2 plus pulses throughout, no edema, no cyanosis no clubbing SKIN:  No rashes no nodules NEURO:  Cranial nerves II through XII grossly intact, motor grossly intact throughout PSYCH:  Cognitively intact, oriented to person place and time   EKG:  Sinus rhythm, rate 97, possible right atrial  enlargement, right axis deviation, interventricular conduction delay, old inferior infarct, possible inferolateral ischemia with T-wave inversions.  01/21/2012   ASSESSMENT AND PLAN   CAD  The patient has no new symptoms since his catheterization. However, he's not participated in risk reduction and I need to get him back on his medications. Start aspirin and a low-dose beta blocker. I will restart an ACE inhibitor.  ATRIAL FIBRILLATION, PAROXYSMAL  The patient's had no symptomatic paroxysms. He has been off warfarin for a while. At this point with compliance is a question unless inclined to restart this medication until I understand his compliance with followup and reassess his risk.  CARDIOMYOPATHY  I will check an echocardiogram and restart the medications as above.  DM  His  hemoglobin A1c was 14! Just restarting medications and being followed by his primary provider.  OBESITY  He has lost about 17 pounds and we discussed diet.  DYSLIPIDEMIA His LDL recently was 191.7. However, he says he cannot take any of the statins. He would not be able to afford Zetia. I might consider niacin in the future. Until then diet and blood sugar control are extremely important.[name]

## 2012-01-21 NOTE — Patient Instructions (Addendum)
Please restart your Prinivil 10 mg a day, Toprol XL 10 mg a day, ASA 325 mg a day and SL Ntg if needed for chest pain.  Your physician has requested that you have an echocardiogram. Echocardiography is a painless test that uses sound waves to create images of your heart. It provides your doctor with information about the size and shape of your heart and how well your heart's chambers and valves are working. This procedure takes approximately one hour. There are no restrictions for this procedure.  Follow up in 3 months with Tereso Newcomer, PA

## 2012-02-03 ENCOUNTER — Ambulatory Visit (HOSPITAL_COMMUNITY): Payer: Medicare Other | Attending: Cardiology

## 2012-02-03 ENCOUNTER — Other Ambulatory Visit (HOSPITAL_COMMUNITY): Payer: Medicare Other

## 2012-02-03 DIAGNOSIS — I379 Nonrheumatic pulmonary valve disorder, unspecified: Secondary | ICD-10-CM | POA: Insufficient documentation

## 2012-02-03 DIAGNOSIS — I4891 Unspecified atrial fibrillation: Secondary | ICD-10-CM | POA: Insufficient documentation

## 2012-02-03 DIAGNOSIS — I509 Heart failure, unspecified: Secondary | ICD-10-CM | POA: Insufficient documentation

## 2012-02-03 DIAGNOSIS — I428 Other cardiomyopathies: Secondary | ICD-10-CM | POA: Insufficient documentation

## 2012-02-03 DIAGNOSIS — I369 Nonrheumatic tricuspid valve disorder, unspecified: Secondary | ICD-10-CM | POA: Insufficient documentation

## 2012-02-03 DIAGNOSIS — E119 Type 2 diabetes mellitus without complications: Secondary | ICD-10-CM | POA: Insufficient documentation

## 2012-02-03 DIAGNOSIS — I059 Rheumatic mitral valve disease, unspecified: Secondary | ICD-10-CM | POA: Insufficient documentation

## 2012-02-03 DIAGNOSIS — I2510 Atherosclerotic heart disease of native coronary artery without angina pectoris: Secondary | ICD-10-CM | POA: Insufficient documentation

## 2012-02-03 NOTE — Progress Notes (Signed)
Echocardiogram performed.  

## 2012-02-18 ENCOUNTER — Other Ambulatory Visit: Payer: Self-pay

## 2012-02-18 MED ORDER — INSULIN GLARGINE 100 UNIT/ML ~~LOC~~ SOLN: 50.0000 [IU] | Freq: Two times a day (BID) | SUBCUTANEOUS | Status: DC

## 2012-04-03 ENCOUNTER — Telehealth: Payer: Self-pay | Admitting: Internal Medicine

## 2012-04-03 NOTE — Telephone Encounter (Signed)
Patient Information:  Caller Name: Domenic Schwab  Phone: [phone]  Patient: Leroy [name], Leroy [name]  Gender: Male  DOB: 08-04-1945  Age: 66 Years  PCP: [name] (Adults only)  Office Follow Up:  Does the office need to follow up with this patient?: No  Instructions For The Office: N/A  RN Note:  Per disposition patient will wait for call back from office staff for same day appt. Or advise to go to [facility] ED for evaluation.  PLEASE F/U WITH PT. WITH APPT. NEEDS.  THANK YOU.  Symptoms  Reason For Call & Symptoms: Patient states he was cleaning his toe nails with nail clipper and has clear drainage  from the left great toe. Pain is 9 -10 on pain scale at this time.  Reviewed Health History In EMR: Yes  Reviewed Medications In EMR: Yes  Reviewed Allergies In EMR: Yes  Reviewed Surgeries / Procedures: Yes  Date of Onset of Symptoms: 04/01/2012  Treatments Tried: soaking in espom salt and peroxide.  Treatments Tried Worked: Yes  Guideline(s) Used:  Foot Pain  Disposition Per Guideline:   Go to ED Now  Reason For Disposition Reached:   Purple or black skin on foot or toe  Advice Given:  N/A

## 2012-04-14 ENCOUNTER — Telehealth: Payer: Self-pay | Admitting: Internal Medicine

## 2012-04-14 ENCOUNTER — Telehealth: Payer: Self-pay | Admitting: *Deleted

## 2012-04-14 ENCOUNTER — Other Ambulatory Visit: Payer: Self-pay | Admitting: *Deleted

## 2012-04-14 DIAGNOSIS — M79609 Pain in unspecified limb: Secondary | ICD-10-CM

## 2012-04-14 NOTE — Telephone Encounter (Signed)
Patient Information:  Caller Name: Lelon Mast  Phone: [phone]  Patient: Leroy, [name]  Gender: Male  DOB: 12/03/1945  Age: 67 Years  PCP: [name] (Adults only)  Office Follow Up:  Does the office need to follow up with this patient?: Yes  Instructions For The Office: Please call patient with Insulin advice. Novolin N is not controlling blood sugars. Patient cannot afford Lantus. Blood sugars reading 'HIGH'. Last reading was 498 per caller. PLEASE ADVISE. PATIENT REFUSING 911 and to go to the hospital for evaluation of disorientation/confusion.  RN Note:  Caller states patient has been slower to respond to questions today. Caller states patient is normally talking and joking and he has been in the bed all day. Caller states the patient is weaker than normal but can walk. Caller states patient is standing in the hallway in his under clothing. Patient is refusing to call 911 and go to the hospital.  Symptoms  Reason For Call & Symptoms: Calling about increased blood sugars. Currently taking Novolin N. Off Lantus for 1 month because of cost.  Reviewed Health History In EMR: Yes  Reviewed Medications In EMR: Yes  Reviewed Allergies In EMR: Yes  Reviewed Surgeries / Procedures: Yes  Date of Onset of Symptoms: 04/07/2012  Treatments Tried: Insulin  Treatments Tried Worked: No  Guideline(s) Used:  Diabetes - High Blood Sugar  Disposition Per Guideline:   Call EMS 911 Now  Reason For Disposition Reached:   Acting confused (e.g., disoriented, slurred speech)  Advice Given:  N/A  Patient Refused Recommendation:  Patient Requests Prescription  Caller would like an Rx for a different Insulin to help bring blood sugars down

## 2012-04-14 NOTE — Telephone Encounter (Signed)
Left smg on triage requesting call back concerning father medications. Called back no answer LMOM RTC...Raechel Chute

## 2012-04-14 NOTE — Telephone Encounter (Signed)
See previous msg../lmb 

## 2012-04-14 NOTE — Telephone Encounter (Signed)
Called daughter back advise ER as call a nurse did. She states BS was taking this am it read 498 he has taking his insulin BS has came down to 281. Pt wanting md to rx a different type of insulin. Inform p[t will need ov haven't seen md since 12/2011. Made appt for tomorrow @ 11:15...Raechel Chute

## 2012-04-15 ENCOUNTER — Encounter: Payer: Self-pay | Admitting: Internal Medicine

## 2012-04-15 ENCOUNTER — Ambulatory Visit (INDEPENDENT_AMBULATORY_CARE_PROVIDER_SITE_OTHER): Payer: Medicare Other | Admitting: Internal Medicine

## 2012-04-15 ENCOUNTER — Other Ambulatory Visit (INDEPENDENT_AMBULATORY_CARE_PROVIDER_SITE_OTHER): Payer: Medicare Other

## 2012-04-15 VITALS — BP 110/78 | HR 119 | Temp 97.2°F | Resp 20 | Wt 300.0 lb

## 2012-04-15 DIAGNOSIS — R7309 Other abnormal glucose: Secondary | ICD-10-CM

## 2012-04-15 DIAGNOSIS — I4891 Unspecified atrial fibrillation: Secondary | ICD-10-CM

## 2012-04-15 DIAGNOSIS — E785 Hyperlipidemia, unspecified: Secondary | ICD-10-CM

## 2012-04-15 DIAGNOSIS — E7849 Other hyperlipidemia: Secondary | ICD-10-CM

## 2012-04-15 LAB — COMPREHENSIVE METABOLIC PANEL
AST: 25 U/L (ref 0–37)
Albumin: 3 g/dL — ABNORMAL LOW (ref 3.5–5.2)
Alkaline Phosphatase: 96 U/L (ref 39–117)
BUN: 19 mg/dL (ref 6–23)
Creatinine, Ser: 1.7 mg/dL — ABNORMAL HIGH (ref 0.4–1.5)
Glucose, Bld: 316 mg/dL — ABNORMAL HIGH (ref 70–99)
Potassium: 4.4 mEq/L (ref 3.5–5.1)
Total Bilirubin: 0.7 mg/dL (ref 0.3–1.2)

## 2012-04-15 LAB — CBC WITH DIFFERENTIAL/PLATELET
Basophils Relative: 0.6 % (ref 0.0–3.0)
Eosinophils Absolute: 0.1 10*3/uL (ref 0.0–0.7)
Eosinophils Relative: 1.7 % (ref 0.0–5.0)
HCT: 49 % (ref 39.0–52.0)
Lymphs Abs: 1.4 10*3/uL (ref 0.7–4.0)
MCHC: 34.3 g/dL (ref 30.0–36.0)
MCV: 89.5 fl (ref 78.0–100.0)
Monocytes Absolute: 0.9 10*3/uL (ref 0.1–1.0)
Neutrophils Relative %: 61 % (ref 43.0–77.0)
RBC: 5.47 Mil/uL (ref 4.22–5.81)

## 2012-04-15 LAB — URINALYSIS, ROUTINE W REFLEX MICROSCOPIC
Bilirubin Urine: NEGATIVE
Nitrite: NEGATIVE
Urine Glucose: 100
Urobilinogen, UA: 0.2 (ref 0.0–1.0)

## 2012-04-15 MED ORDER — RIVAROXABAN 20 MG PO TABS: 20.0000 mg | ORAL_TABLET | Freq: Every day | ORAL | Status: DC

## 2012-04-15 MED ORDER — COLESEVELAM HCL 625 MG PO TABS: 1875.0000 mg | ORAL_TABLET | Freq: Two times a day (BID) | ORAL | Status: DC

## 2012-04-15 NOTE — Assessment & Plan Note (Addendum)
I have given him samples of his meds to help out I have asked him to add welchol to his meds regimen Today I will recheck his a1c and his renal function

## 2012-04-15 NOTE — Assessment & Plan Note (Signed)
He was not able to keep with the coumadin blood testing and has therefore not been compliant I advised that her start Xarelto today

## 2012-04-15 NOTE — Progress Notes (Signed)
Subjective:    Patient ID: Leroy [name], male    DOB: 27-Oct-1945, 67 y.o.   MRN: [id number]  Diabetes He presents for his follow-up diabetic visit. He has type 2 diabetes mellitus. His disease course has been worsening. There are no hypoglycemic associated symptoms. Pertinent negatives for hypoglycemia include no dizziness or speech difficulty. Associated symptoms include polydipsia, polyphagia and polyuria. Pertinent negatives for diabetes include no blurred vision, no chest pain, no fatigue, no foot paresthesias, no foot ulcerations, no visual change, no weakness and no weight loss. There are no hypoglycemic complications. Symptoms are worsening. Diabetic complications include peripheral neuropathy and PVD. Current diabetic treatment includes oral agent (dual therapy), intensive insulin program and insulin injections. He is compliant with treatment some of the time. His weight is stable. Meal planning includes avoidance of concentrated sweets. He never participates in exercise. His breakfast blood glucose range is generally 180-200 mg/dl. His lunch blood glucose range is generally >200 mg/dl. His dinner blood glucose range is generally >200 mg/dl. His highest blood glucose is >200 mg/dl. His overall blood glucose range is >200 mg/dl. An ACE inhibitor/angiotensin II receptor blocker is being taken. He sees a podiatrist (he had surgery on his left foot yesterday).Eye exam is current.      Review of Systems  Constitutional: Negative for fever, chills, weight loss, diaphoresis, activity change, appetite change, fatigue and unexpected weight change.  HENT: Negative.   Eyes: Negative.  Negative for blurred vision.  Respiratory: Negative for cough, chest tightness, shortness of breath, wheezing and stridor.   Cardiovascular: Negative for chest pain, palpitations and leg swelling.  Gastrointestinal: Negative for nausea, vomiting, abdominal pain, diarrhea, constipation and anal bleeding.  Genitourinary:  Positive for polyuria.  Musculoskeletal: Negative for myalgias, back pain, joint swelling and gait problem.  Skin: Negative.   Neurological: Negative.  Negative for dizziness, facial asymmetry, speech difficulty, weakness, light-headedness and numbness.  Hematological: Positive for polydipsia and polyphagia. Negative for adenopathy. Does not bruise/bleed easily.  Psychiatric/Behavioral: Negative.        Objective:   Physical Exam  Vitals reviewed. Constitutional: He is oriented to person, place, and time. He appears well-developed and well-nourished. No distress.  HENT:  Head: Normocephalic and atraumatic.  Mouth/Throat: Oropharynx is clear and moist. No oropharyngeal exudate.  Eyes: Conjunctivae normal are normal. Right eye exhibits no discharge. Left eye exhibits no discharge. No scleral icterus.  Neck: Normal range of motion. Neck supple. No JVD present. No tracheal deviation present. No thyromegaly present.  Cardiovascular: Normal rate, regular rhythm, normal heart sounds and intact distal pulses.  Exam reveals no gallop and no friction rub.   No murmur heard. Pulmonary/Chest: Effort normal and breath sounds normal. No stridor. No respiratory distress. He has no wheezes. He has no rales. He exhibits no tenderness.  Abdominal: Soft. Bowel sounds are normal. He exhibits no distension and no mass. There is no tenderness. There is no rebound and no guarding.  Musculoskeletal: Normal range of motion. He exhibits edema (trace edema in BLE). He exhibits no tenderness.  Lymphadenopathy:    He has no cervical adenopathy.  Neurological: He is oriented to person, place, and time.  Skin: Skin is warm and dry. No rash noted. He is not diaphoretic. No erythema. No pallor.  Psychiatric: He has a normal mood and affect. His behavior is normal. Judgment and thought content normal.      Lab Results  Component Value Date   WBC 6.1 01/02/2012   HGB 15.4 01/02/2012   HCT  46.4 01/02/2012   PLT 165.0  01/02/2012   GLUCOSE 441* 01/02/2012   CHOL 263* 01/02/2012   TRIG 172.0* 01/02/2012   HDL 42.60 01/02/2012   LDLDIRECT 191.7 01/02/2012   LDLCALC  Value: 182        Total Cholesterol/HDL:CHD Risk Coronary Heart Disease Risk Table                     Men   Women  1/2 Average Risk   3.4   3.3  Average Risk       5.0   4.4  2 X Average Risk   9.6   7.1  3 X Average Risk  23.4   11.0        Use the calculated Patient Ratio above and the CHD Risk Table to determine the patient's CHD Risk.        ATP III CLASSIFICATION (LDL):  <100     mg/dL   Optimal  161-096  mg/dL   Near or Above                    Optimal  130-159  mg/dL   Borderline  045-409  mg/dL   High  >811     mg/dL   Very High* 91/47/8295   ALT 22 01/02/2012   AST 19 01/02/2012   NA 134* 01/02/2012   K 4.4 01/02/2012   CL 102 01/02/2012   CREATININE 1.4 01/02/2012   BUN 17 01/02/2012   CO2 24 01/02/2012   TSH 0.99 01/02/2012   INR 1.6 04/25/2010   HGBA1C 14.8* 01/02/2012      Assessment & Plan:

## 2012-04-15 NOTE — Assessment & Plan Note (Signed)
He will not take a statin 

## 2012-04-15 NOTE — Patient Instructions (Signed)

## 2012-04-21 ENCOUNTER — Ambulatory Visit: Payer: Medicare Other | Admitting: Physician Assistant

## 2012-04-24 ENCOUNTER — Encounter (HOSPITAL_COMMUNITY): Payer: Self-pay | Admitting: Pharmacy Technician

## 2012-04-24 ENCOUNTER — Telehealth: Payer: Self-pay | Admitting: Cardiology

## 2012-04-24 ENCOUNTER — Encounter: Payer: Self-pay | Admitting: Vascular Surgery

## 2012-04-24 ENCOUNTER — Other Ambulatory Visit: Payer: Self-pay

## 2012-04-24 ENCOUNTER — Ambulatory Visit (INDEPENDENT_AMBULATORY_CARE_PROVIDER_SITE_OTHER): Payer: Medicare Other | Admitting: Vascular Surgery

## 2012-04-24 VITALS — BP 123/104 | HR 107 | Ht 69.0 in | Wt 298.0 lb

## 2012-04-24 DIAGNOSIS — I999 Unspecified disorder of circulatory system: Secondary | ICD-10-CM

## 2012-04-24 DIAGNOSIS — I70269 Atherosclerosis of native arteries of extremities with gangrene, unspecified extremity: Secondary | ICD-10-CM

## 2012-04-24 DIAGNOSIS — I998 Other disorder of circulatory system: Secondary | ICD-10-CM | POA: Insufficient documentation

## 2012-04-24 NOTE — Progress Notes (Signed)
[facility]  Referred by:  Jason C. Zeigler, MD 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 (02/26/1946) male who presents with chief complaint: "black left toe".  Onset of symptom occurred 1-2 months, .  Pain is described as aching in left great toe, severity 3-6/10, and associated with movement.  The patient had the left great toe nail removed due to concerns with abscess in nailbed.  After removing the nail bed, the underlying tissue has turned black.  Patient has attempted to treat this pain with abx and pain rx..  The patient has no rest pain symptoms also.  Atherosclerotic risk factors include: IDM, hyperlipiemia, and prior smoker.  Past Medical History  Diagnosis Date  . Hyperlipidemia   . Diabetes mellitus   . CHF (congestive heart failure)     EF 40% 2010  . Paroxysmal atrial fibrillation   . Chronic anticoagulation   . Myocardial infarction   . Cataract     Past Surgical History  Procedure Date  . Coronary artery bypass graft     Most recent cath 2010. LIMA to the LAD patent, saphenous vein graft to an OM patent, saphenous vein graft to right coronary artery occluded.  . Revasculariztion     lower extremity    History   Social History  . Marital Status: Legally Separated    Spouse Name: N/A    Number of Children: N/A  . Years of Education: N/A   Occupational History  . Not on file.   Social History Main Topics  . Smoking status: Former Smoker    Types: Cigarettes    Quit date: 01/02/1992  . Smokeless tobacco: Never Used  . Alcohol Use: 1.8 oz/week    3 Cans of beer per week  . Drug Use: No  . Sexually Active: Not Currently   Other Topics Concern  . Not on file   Social History Narrative  . No narrative on file    Family History  Problem Relation Age of Onset  . Heart disease Mother     before age 60  . Diabetes  Mother   . Heart attack Mother   . Heart disease Father     Current Outpatient Prescriptions on File Prior to Visit  Medication Sig Dispense Refill  . aspirin 325 MG EC tablet Take 325 mg by mouth daily.      . Blood Glucose Monitoring Suppl (CONTOUR BLOOD GLUCOSE SYSTEM) DEVI by Does not apply route.      . colesevelam (WELCHOL) 625 MG tablet Take 3 tablets (1,875 mg total) by mouth 2 (two) times daily with a meal.  120 tablet  11  . glucose blood (BAYER CONTOUR TEST) test strip 1 each by Other route 2 (two) times daily. Use as instructed      . insulin glargine (LANTUS SOLOSTAR) 100 UNIT/ML injection Inject 50 Units into the skin 2 (two) times daily.  10 pen  2  . lisinopril (PRINIVIL,ZESTRIL) 10 MG tablet Take 1 tablet (10 mg total) by mouth daily.  30 tablet  11  . metoprolol succinate (TOPROL-XL) 25 MG 24 hr tablet Take 1 tablet (25 mg total) by mouth daily.  30 tablet  11  . nitroGLYCERIN (NITROSTAT) 0.4 MG SL tablet Place 1 tablet (0.4 mg total) under the tongue every 5 (five) minutes as needed.  25 tablet  11  .   Rivaroxaban (XARELTO) 20 MG TABS Take 1 tablet (20 mg total) by mouth daily.  90 tablet  3  . Saxagliptin-Metformin 2.08-998 MG TB24 Take 1 tablet by mouth 2 (two) times daily.  120 tablet  0    Allergies  Allergen Reactions  . Statins     Muscle aches  . Amlodipine Besylate   . Cephalexin     REACTION: Hives    REVIEW OF SYSTEMS:  (Positives checked otherwise negative)  CARDIOVASCULAR:  [ ] chest pain, [ ] chest pressure, [ ] palpitations, [ ] shortness of breath when laying flat, [ ] shortness of breath with exertion,   [ ] pain in feet when walking, [x] pain in feet when laying flat, [ ] history of blood clot in veins (DVT), [ ] history of phlebitis, [ ] swelling in legs, [ ] varicose veins  PULMONARY:  [ ] productive cough, [ ] asthma, [ ] wheezing  NEUROLOGIC:  [ ] weakness in arms or legs, [ ] numbness in arms or legs, [ ] difficulty speaking or slurred speech,  [ ] temporary loss of vision in one eye, [ ] dizziness  HEMATOLOGIC:  [ ] bleeding problems, [ ] problems with blood clotting too easily  MUSCULOSKEL:  [ ] joint pain, [ ] joint swelling  GASTROINTEST:  [ ]  Vomiting blood, [ ]  Blood in stool     GENITOURINARY:  [ ]  Burning with urination, [ ]  Blood in urine  PSYCHIATRIC:  [ ] history of major depression  INTEGUMENTARY:  [ ] rashes, [ ] ulcers  CONSTITUTIONAL:  [ ] fever, [ ] chills   Physical Examination  Filed Vitals:   04/24/12 1522  BP: 123/104  Pulse: 107  Height: 5' 9" (1.753 m)  Weight: 298 lb (135.172 kg)  SpO2: 99%   Body mass index is 44.01 kg/(m^2).  General: A&O x 3, WD, obese  Head: [place]/AT  Ear/Nose/Throat: Hearing grossly intact, nares w/o erythema or drainage, oropharynx w/o Erythema/Exudate  Eyes: PERRLA, EOMI  Neck: Supple, no nuchal rigidity, no palpable LAD  Pulmonary: Sym exp, good air movt, CTAB, no rales, rhonchi, & wheezing  Cardiac: RRR, Nl S1, S2, no Murmurs, rubs or gallops  Vascular: Vessel Right Left  Radial Palpable Palpable  Ulnar Palpable Palpable  Brachial Palpable Palpable  Carotid Palpable, without bruit Palpable, without bruit  Aorta Not palpable N/A  Femoral Palpable Palpable  Popliteal Not palpable Not palpable  PT not Palpable not Palpable  DP not Palpable not Palpable   Gastrointestinal: soft, NTND, -G/R, - HSM, - masses, - CVAT B  Musculoskeletal: M/S 5/5 throughout , B lower leg with chronic venous skin changes, L foot with ischemic great toe with skin breakdown between toes 1-2  Neurologic: CN 2-12 intact , Pain and light touch intact in extremities , Motor exam as listed above  Psychiatric: Judgment intact, Mood & affect appropriatefor pt's clinical situation  Dermatologic: See M/S exam for extremity exam, no rashes otherwise noted  Lymph : No Cervical, Axillary, or Inguinal lymphadenopathy   Outside Studies/Documentation 5 pages of outside documents were  reviewed including: outpatient pod records.  Medical Decision Making  Petro J Ament is a 67 y.o. male who presents with: LLE critical limb ischemia.   I discussed with the patient the natural history of critical limb ischemia: 25% require amputation in one year, 50% are able to maintain their limbs in one year, and 25-30% die in one year due to comorbidities.    Given the limb threatening status of this patient, I recommend an aggressive work up including proceeding with an: Aortogram, Bilateral runoff and intervention. I discussed with the patient the nature of angiographic procedures, especially the limited patencies of any endovascular intervention. The patient is aware of that the risks of an angiographic procedure include but are not limited to: bleeding, infection, access site complications, embolization, rupture of treated vessel, dissection, possible need for emergent surgical intervention, and possible need for surgical procedures to treat the patient's pathology. The patient is aware of the risks and agrees to proceed.  The procedure is scheduled for: 23 JAN 14. Given the severity of this patient's condition, I am going to expedite his work-up: R GSV mapping (L harvested for CABG), preop cardiology risk stratification and optimization, and BLE ABI with toe pressures to obtain baseline metrics.  I discussed in depth with the patient the nature of atherosclerosis, and emphasized the importance of maximal medical management including strict control of blood pressure, blood glucose, and lipid levels, antiplatelet agents, obtaining regular exercise, and cessation of smoking.  The patient is aware that without maximal medical management the underlying atherosclerotic disease process will progress, limiting the benefit of any interventions.  Thank you for allowing us to participate in this patient's care.  Brian Chen, MD [facility] Office: [phone] Pager:  [phone]  04/24/2012, 3:52 PM     

## 2012-04-24 NOTE — Addendum Note (Signed)
Addended by: Dannielle Karvonen on: 04/24/2012 04:29 PM   Modules accepted: Orders

## 2012-04-24 NOTE — Telephone Encounter (Signed)
Leroy [name] w/ vascular and vein  needs ok for surgical clearance for leg bypass 04-30-12

## 2012-04-24 NOTE — Telephone Encounter (Signed)
Pt has appt with Tereso Newcomer, PA on April 28, 2012.  Will forward message to Dr Antoine Poche for review. Message left on Kathy's voicemail with this information.

## 2012-04-27 ENCOUNTER — Telehealth: Payer: Self-pay | Admitting: Internal Medicine

## 2012-04-27 NOTE — Telephone Encounter (Signed)
Once pt has been seen in the office a determination will be made as to if further testing as needed prior to surgical clearance.  Will forward to Tereso Newcomer, [place] for his knowledge.

## 2012-04-27 NOTE — Telephone Encounter (Signed)
Patient assistance was sent in without the frequency and dosage and now they need a script to be faxed in, per daughter they have faxed over a request for this information, if it is faxed back today they can process this medication for the patient this week. They are also requesting samples of the Lantus to last the patient until he receives his patient assitance

## 2012-04-27 NOTE — Telephone Encounter (Signed)
Couple boxes of Lantus avail, unsure if will last until pt asst arrives. Also not sure what wah submitted to company for pt asst.

## 2012-04-28 ENCOUNTER — Ambulatory Visit (INDEPENDENT_AMBULATORY_CARE_PROVIDER_SITE_OTHER): Payer: Medicare Other | Admitting: Physician Assistant

## 2012-04-28 ENCOUNTER — Other Ambulatory Visit: Payer: Self-pay

## 2012-04-28 ENCOUNTER — Encounter: Payer: Self-pay | Admitting: Physician Assistant

## 2012-04-28 VITALS — BP 138/0 | HR 103 | Ht 69.0 in | Wt 291.1 lb

## 2012-04-28 DIAGNOSIS — I10 Essential (primary) hypertension: Secondary | ICD-10-CM

## 2012-04-28 DIAGNOSIS — I2510 Atherosclerotic heart disease of native coronary artery without angina pectoris: Secondary | ICD-10-CM

## 2012-04-28 DIAGNOSIS — Z01810 Encounter for preprocedural cardiovascular examination: Secondary | ICD-10-CM

## 2012-04-28 DIAGNOSIS — I5022 Chronic systolic (congestive) heart failure: Secondary | ICD-10-CM

## 2012-04-28 DIAGNOSIS — I4891 Unspecified atrial fibrillation: Secondary | ICD-10-CM

## 2012-04-28 MED ORDER — INSULIN GLARGINE 100 UNIT/ML ~~LOC~~ SOLN: 50.0000 [IU] | Freq: Two times a day (BID) | SUBCUTANEOUS | Status: DC

## 2012-04-28 MED ORDER — METOPROLOL SUCCINATE ER 50 MG PO TB24: 50.0000 mg | ORAL_TABLET | Freq: Every day | ORAL | Status: DC

## 2012-04-28 NOTE — Patient Instructions (Addendum)
Your physician has requested that you have a WITH IN 1 WEEK lexiscan myoview PER DR. HOCHREIN AND [facility],,. For further information please visit [email or web address] Please follow instruction sheet, as given.  Your physician recommends that you schedule a follow-up appointment in: 3 MONTHS WITH DR. HOCHREIN  INCREASE YOUR TOPROL TO 50 MG DAILY; A NEW RX WAS SENT IN FOR THE 50 MG TABLET

## 2012-04-28 NOTE — Progress Notes (Signed)
[address] [place], [place]  [address] Phone: [phone], Fax:  [phone]  Date:  04/28/2012   ID:  Leroy, [name] 03-Nov-1945, MRN [id number]  PCP:  Leroy Linger, MD  Primary Cardiologist:  Dr. Rollene Rotunda     History of Present Illness: Leroy [name] is a 67 y.o. male who returns for surgical clearance.  He has a hx of CAD, s/p CABG in 2001, DM2, HL, atrial fibrillation, ischemic cardiomyopathy, systolic CHF. LHC 12/11: SVG-OM patent, SVG-RCA occluded, native RCA with severe diffuse disease and chronic total occlusion, LIMA-LAD patent, medical therapy pursued. Echocardiogram 10/13: Moderate LVH, EF 35-40%, mild MR, mild LAE, PASP 40.  Last seen by Dr. Antoine Poche 10/13. Patient had been out of most of his medications and these were restarted. He was off of Coumadin and due to concerns of her compliance, this was not restarted. He was recently evaluated by vascular surgery for left great toe gangrene. He needs angiography and possible intervention.  Patient denies chest pain. He does note dyspnea with activity. He is probably NYHA class II-IIb. He probably can achieve 4 METs or. He denies orthopnea, PND or significant pedal edema. He denies syncope.  Labs (1/14):   K4.4, creatinine 1.7, Hgb 16.8  Wt Readings from Last 3 Encounters:  04/28/12 291 lb 1.9 oz (132.051 kg)  04/24/12 298 lb (135.172 kg)  04/15/12 300 lb (136.079 kg)     Past Medical History  Diagnosis Date  . Hyperlipidemia   . Diabetes mellitus   . CHF (congestive heart failure)     EF 40% 2010  . Paroxysmal atrial fibrillation   . Chronic anticoagulation   . Myocardial infarction   . Cataract     Current Outpatient Prescriptions  Medication Sig Dispense Refill  . colesevelam (WELCHOL) 625 MG tablet Take 3 tablets (1,875 mg total) by mouth 2 (two) times daily with a meal.  120 tablet  11  . HYDROcodone-acetaminophen (NORCO) 10-325 MG per tablet Take 1 tablet by mouth every 4  (four) hours as needed.      . insulin glargine (LANTUS SOLOSTAR) 100 UNIT/ML injection Inject 50 Units into the skin 2 (two) times daily.  10 pen  2  . lisinopril (PRINIVIL,ZESTRIL) 10 MG tablet Take 1 tablet (10 mg total) by mouth daily.  30 tablet  11  . metoprolol succinate (TOPROL-XL) 25 MG 24 hr tablet Take 1 tablet (25 mg total) by mouth daily.  30 tablet  11  . nitroGLYCERIN (NITROSTAT) 0.4 MG SL tablet Place 1 tablet (0.4 mg total) under the tongue every 5 (five) minutes as needed.  25 tablet  11  . Saxagliptin-Metformin 2.08-998 MG TB24 Take 1 tablet by mouth 2 (two) times daily.  120 tablet  0    Allergies:    Allergies  Allergen Reactions  . Statins     Muscle aches  . Amlodipine Besylate   . Cephalexin     REACTION: Hives    Social History:  The patient  reports that he quit smoking about 20 years ago. His smoking use included Cigarettes. He has never used smokeless tobacco. He reports that he drinks about 1.8 ounces of alcohol per week. He reports that he does not use illicit drugs.   ROS:  Please see the history of present illness.   He has a nonproductive cough.   All other systems reviewed and negative.   PHYSICAL EXAM: VS:  BP 138/0  Pulse 103  Ht 5\' 9"  (  1.753 m)  Wt 291 lb 1.9 oz (132.051 kg)  BMI 42.99 kg/m2 Well nourished, well developed, in no acute distress HEENT: normal Neck: no JVD Cardiac:  normal S1, S2; RRR; no murmur Lungs:  clear to auscultation bilaterally, no wheezing, rhonchi or rales Abd: soft, nontender, no hepatomegaly Ext: no edema Skin: warm and dry Neuro:  CNs 2-12 intact, no focal abnormalities noted  EKG:  Sinus tachycardia, HR 103, inferior Q waves, interventricular conduction delay, T-wave inversions in 1, aVL, V4-V6, no significant change compared to prior tracing     ASSESSMENT AND PLAN:  1. Surgical Clearance:  There were reports that the patient needed distal aortogram as well as lower extremity bypass. Patient does not require  further workup prior to distal aortogram plus or minus percutaneous intervention. However, if he requires lower extremity bypass surgery, he will need an ischemic evaluation. We did contact the vascular surgeon's office. At this point, plan is for distal aortogram plus or minus intervention only. He was recently placed on Xarelto by his PCP. He does not require bridging with Lovenox. He may stop Xarelto 2 days before his procedure and restart afterwards when felt to be safe. 2. Coronary Artery Disease:  Continue aspirin. We will arrange a Lexiscan Myoview for risk stratification should he require lower extremity bypass surgery. 3. Hypertension:  We had great difficulty getting his blood pressure reading today. We eventually were able to obtain it with Doppler ultrasound. Blood pressure appears to be stable.  I have asked him to increase Toprol to 50 mg QD.  This will help reduce CV complications during surgery. 4. Hyperlipidemia: He has reported intolerance to all statins in the past. 5. Chronic Systolic CHF:  Volume stable.  Continue current Rx. 6. Disposition:  Followup in 3 months with Dr. Antoine Poche or me.  Signed, Tereso Newcomer, PA-C  10:11 AM 04/28/2012

## 2012-04-28 NOTE — Telephone Encounter (Signed)
Received fax from [employer] stating that a prescription from PCP is required in order to complete process for Lantus. May fax rx to [phone]. RX printed and submitted to company per request

## 2012-04-28 NOTE — Telephone Encounter (Signed)
Patient seen in office today. See my note. Tereso Newcomer, PA-C  4:59 PM 04/28/2012

## 2012-04-29 MED ORDER — SODIUM CHLORIDE 0.9 % IV SOLN
INTRAVENOUS | Status: DC
  Administered 2012-04-30: 09:00:00 via INTRAVENOUS

## 2012-04-30 ENCOUNTER — Encounter (HOSPITAL_COMMUNITY)
Admission: RE | Disposition: A | Discharge status: Home / Self Care | Payer: Self-pay | Source: Ambulatory Visit | Attending: Vascular Surgery

## 2012-04-30 ENCOUNTER — Other Ambulatory Visit: Payer: Self-pay | Admitting: *Deleted

## 2012-04-30 ENCOUNTER — Ambulatory Visit (HOSPITAL_COMMUNITY)
Admission: RE | Admit: 2012-04-30 | Discharge: 2012-04-30 | Disposition: A | Discharge status: Home / Self Care | Payer: Medicare Other | Source: Ambulatory Visit | Attending: Vascular Surgery | Admitting: Vascular Surgery

## 2012-04-30 DIAGNOSIS — Z01810 Encounter for preprocedural cardiovascular examination: Secondary | ICD-10-CM

## 2012-04-30 DIAGNOSIS — I70269 Atherosclerosis of native arteries of extremities with gangrene, unspecified extremity: Secondary | ICD-10-CM

## 2012-04-30 DIAGNOSIS — I739 Peripheral vascular disease, unspecified: Secondary | ICD-10-CM

## 2012-04-30 HISTORY — PX: ABDOMINAL AORTAGRAM: SHX5706

## 2012-04-30 HISTORY — PX: ABDOMINAL AORTAGRAM: SHX5454

## 2012-04-30 HISTORY — PX: LOWER EXTREMITY ANGIOGRAM: SHX5508

## 2012-04-30 LAB — POCT I-STAT, CHEM 8
Calcium, Ion: 1.18 mmol/L (ref 1.13–1.30)
Creatinine, Ser: 1.2 mg/dL (ref 0.50–1.35)
Glucose, Bld: 231 mg/dL — ABNORMAL HIGH (ref 70–99)
Hemoglobin: 15 g/dL (ref 13.0–17.0)
Potassium: 4.4 mEq/L (ref 3.5–5.1)
TCO2: 27 mmol/L (ref 0–100)

## 2012-04-30 LAB — GLUCOSE, CAPILLARY

## 2012-04-30 SURGERY — ABDOMINAL AORTAGRAM
Anesthesia: LOCAL

## 2012-04-30 MED ORDER — OXYCODONE HCL 5 MG PO TABS
ORAL_TABLET | ORAL | Status: AC
  Filled 2012-04-30: qty 2

## 2012-04-30 MED ORDER — ONDANSETRON HCL 4 MG/2ML IJ SOLN: 4.0000 mg | Freq: Four times a day (QID) | INTRAMUSCULAR | Status: DC | PRN

## 2012-04-30 MED ORDER — HYDRALAZINE HCL 20 MG/ML IJ SOLN
INTRAMUSCULAR | Status: AC
  Filled 2012-04-30: qty 1

## 2012-04-30 MED ORDER — ACETAMINOPHEN 325 MG PO TABS: 650.0000 mg | ORAL_TABLET | ORAL | Status: DC | PRN

## 2012-04-30 MED ORDER — SODIUM CHLORIDE 0.9 % IV SOLN: 1.0000 mL/kg/h | INTRAVENOUS | Status: DC

## 2012-04-30 MED ORDER — LIDOCAINE HCL (PF) 1 % IJ SOLN
INTRAMUSCULAR | Status: AC
  Filled 2012-04-30: qty 30

## 2012-04-30 MED ORDER — MORPHINE SULFATE 2 MG/ML IJ SOLN
INTRAMUSCULAR | Status: AC
  Filled 2012-04-30: qty 1

## 2012-04-30 MED ORDER — OXYCODONE HCL 5 MG PO TABS
10.0000 mg | ORAL_TABLET | Freq: Once | ORAL | Status: AC
  Administered 2012-04-30: 10 mg via ORAL

## 2012-04-30 MED ORDER — MORPHINE SULFATE 2 MG/ML IJ SOLN
2.0000 mg | INTRAMUSCULAR | Status: DC | PRN
  Administered 2012-04-30: 2 mg via INTRAVENOUS

## 2012-04-30 MED ORDER — OXYCODONE-ACETAMINOPHEN 5-325 MG PO TABS: 1.0000 | ORAL_TABLET | ORAL | Status: DC | PRN

## 2012-04-30 NOTE — Op Note (Signed)
OPERATIVE NOTE   PROCEDURE: 1.  Right common femoral artery cannulation under ultrasound guidance 2.  Aortogram 3.  Second order arterial selection 4.  Left leg runoff 5.  Right leg runoff  PRE-OPERATIVE DIAGNOSIS: Left foot gangrene  POST-OPERATIVE DIAGNOSIS: same as above   SURGEON: [name], MD  ANESTHESIA: conscious sedation  ESTIMATED BLOOD LOSS: 30 cc  CONTRAST: 185 cc  FINDING(S):  Aorta:  Patent  Superior mesenteric artery: not seen Celiac artery: not seen  Right Left  RA Patent Patent  CIA Patent Patent  EIA Patent Patent  IIA Patent Occluded  CFA Patent with distal stenosis ~30% proximal to bifurcation Patent  SFA Patent with multiple stenoses including 5 cm distal segment with 50-75% stenoses Patent with diffiuse disease and 75% stenosis proximal and 50% mid-segment  PFA Patent Patent  Pop Patent with diffuse disease Patent with diffuse disease  Trif Patent Patent  AT Patent, only runoff Occluded proximally, reconstitutes via collaterals  Pero Occluded Occluded  PT Occluded Occluded   SPECIMEN(S):  none  INDICATIONS:   Leroy [name] is a 67 y.o. male who presents with left foot gangrene.  The patient presents for: aortogram, bilateral leg runoff, and possible intervention.  I discussed with the patient the nature of angiographic procedures, especially the limited patencies of any endovascular intervention.  The patient is aware of that the risks of an angiographic procedure include but are not limited to: bleeding, infection, access site complications, renal failure, embolization, rupture of vessel, dissection, possible need for emergent surgical intervention, possible need for surgical procedures to treat the patient's pathology, and stroke and death.  The patient is aware of the risks and agrees to proceed.  DESCRIPTION: After full informed consent was obtained from the patient, the patient was brought back to the angiography suite.  The patient was  placed supine upon the angiography table and connected to monitoring equipment.  The patient was then given conscious sedation, the amounts of which are documented in the patient's chart.  The patient was prepped and drape in the standard fashion for an angiographic procedure.  At this point, attention was turned to the right groin.  Under ultrasound guidance, the right common femoral artery was cannulated with a micropuncture needle.  The microwire was advanced into the iliac arterial system.  The needle was exchanged for a microsheath, which was loaded into the common femoral artery over the wire.  There was considerable resistance with passing the right micro-sheath.  The microwire was exchanged for a Rosen wire which was advanced into the aorta.  I then removed the microsheath and tried to pass the 5-Fr sheath but it would not advance.  I loaded a end-hole catheter over the wire.  The wire was exchanged for an Amplatz wire.  The end hole catheter was exchanged for a 6-Fr dilator.  The dilator was exchanged for a 5-Fr sheath.  The Omniflush catheter was then loaded over the wire up to the level of L1.  The catheter was connected to the power injector circuit.  After de-airring and de-clotting the circuit, a power injector aortogram was completed.  The findings are as listed above.  The [facility] wire was replaced in the catheter, and using the Leslie and Omniflush catheter, the left common iliac artery was selected.  The catheter and wire were advanced into the external iliac artery.  An automated left leg runoff was completed after de-airring and de-clotting the circuit.   A tibial injection and lateral foot injection had  to be completed due to patient movement.   The findings are as listed above.  The [facility] wire was replaced in the catheter to straighten the crook of the catheter.  Both were removed together from the sheath.  The right sheath was aspirated and no clot was present.  The sheath was connected to the  power injector circuit.  An automated right leg runoff was completed after de-airring and de-clotting the circuit.  The findings are as listed above.  The sheath was aspirated.  No clots were present and the sheath was reloaded with heparinized saline.    Based on the images, this patient needs: a left common femoral artery to anterior tibial artery bypass.  COMPLICATIONS: none  CONDITION: stable   [name], MD [facility] [place] Office: [phone] Pager: [phone]  04/30/2012, 11:00 AM

## 2012-04-30 NOTE — H&P (View-Only) (Signed)
[facility] [place]  Referred by:  Sherin Quarry, MD [id number] [address] [place], [place] [address]  Reason for referral: left great toe gangrene  History of Present Illness  Leroy [name] is a 67 y.o. (10-15-1945) male who presents with chief complaint: "black left toe".  Onset of symptom occurred 1-2 months, .  Pain is described as aching in left great toe, severity 3-6/10, and associated with movement.  The patient had the left great toe nail removed due to concerns with abscess in nailbed.  After removing the nail bed, the underlying tissue has turned black.  Patient has attempted to treat this pain with abx and pain rx..  The patient has no rest pain symptoms also.  Atherosclerotic risk factors include: IDM, hyperlipiemia, and prior smoker.  Past Medical History  Diagnosis Date  . Hyperlipidemia   . Diabetes mellitus   . CHF (congestive heart failure)     EF 40% 2010  . Paroxysmal atrial fibrillation   . Chronic anticoagulation   . Myocardial infarction   . Cataract     Past Surgical History  Procedure Date  . Coronary artery bypass graft     Most recent cath 2010. LIMA to the LAD patent, saphenous vein graft to an OM patent, saphenous vein graft to right coronary artery occluded.  . Revasculariztion     lower extremity    History   Social History  . Marital Status: Legally Separated    Spouse Name: N/A    Number of Children: N/A  . Years of Education: N/A   Occupational History  . Not on file.   Social History Main Topics  . Smoking status: Former Smoker    Types: Cigarettes    Quit date: 01/02/1992  . Smokeless tobacco: Never Used  . Alcohol Use: 1.8 oz/week    3 Cans of beer per week  . Drug Use: No  . Sexually Active: Not Currently   Other Topics Concern  . Not on file   Social History Narrative  . No narrative on file    Family History  Problem Relation Age of Onset  . Heart disease Mother     before age 6  . Diabetes  Mother   . Heart attack Mother   . Heart disease Father     Current Outpatient Prescriptions on File Prior to Visit  Medication Sig Dispense Refill  . aspirin 325 MG EC tablet Take 325 mg by mouth daily.      . Blood Glucose Monitoring Suppl (CONTOUR BLOOD GLUCOSE SYSTEM) DEVI by Does not apply route.      . colesevelam (WELCHOL) 625 MG tablet Take 3 tablets (1,875 mg total) by mouth 2 (two) times daily with a meal.  120 tablet  11  . glucose blood (BAYER CONTOUR TEST) test strip 1 each by Other route 2 (two) times daily. Use as instructed      . insulin glargine (LANTUS SOLOSTAR) 100 UNIT/ML injection Inject 50 Units into the skin 2 (two) times daily.  10 pen  2  . lisinopril (PRINIVIL,ZESTRIL) 10 MG tablet Take 1 tablet (10 mg total) by mouth daily.  30 tablet  11  . metoprolol succinate (TOPROL-XL) 25 MG 24 hr tablet Take 1 tablet (25 mg total) by mouth daily.  30 tablet  11  . nitroGLYCERIN (NITROSTAT) 0.4 MG SL tablet Place 1 tablet (0.4 mg total) under the tongue every 5 (five) minutes as needed.  25 tablet  11  .  Rivaroxaban (XARELTO) 20 MG TABS Take 1 tablet (20 mg total) by mouth daily.  90 tablet  3  . Saxagliptin-Metformin 2.08-998 MG TB24 Take 1 tablet by mouth 2 (two) times daily.  120 tablet  0    Allergies  Allergen Reactions  . Statins     Muscle aches  . Amlodipine Besylate   . Cephalexin     REACTION: Hives    REVIEW OF SYSTEMS:  (Positives checked otherwise negative)  CARDIOVASCULAR:  [ ]  chest pain, [ ]  chest pressure, [ ]  palpitations, [ ]  shortness of breath when laying flat, [ ]  shortness of breath with exertion,   [ ]  pain in feet when walking, [x]  pain in feet when laying flat, [ ]  history of blood clot in veins (DVT), [ ]  history of phlebitis, [ ]  swelling in legs, [ ]  varicose veins  PULMONARY:  [ ]  productive cough, [ ]  asthma, [ ]  wheezing  NEUROLOGIC:  [ ]  weakness in arms or legs, [ ]  numbness in arms or legs, [ ]  difficulty speaking or slurred speech,  [ ]  temporary loss of vision in one eye, [ ]  dizziness  HEMATOLOGIC:  [ ]  bleeding problems, [ ]  problems with blood clotting too easily  MUSCULOSKEL:  [ ]  joint pain, [ ]  joint swelling  GASTROINTEST:  [ ]   Vomiting blood, [ ]   Blood in stool     GENITOURINARY:  [ ]   Burning with urination, [ ]   Blood in urine  PSYCHIATRIC:  [ ]  history of major depression  INTEGUMENTARY:  [ ]  rashes, [ ]  ulcers  CONSTITUTIONAL:  [ ]  fever, [ ]  chills   Physical Examination  Filed Vitals:   04/24/12 1522  BP: 123/104  Pulse: 107  Height: 5\' 9"  (1.753 m)  Weight: 298 lb (135.172 kg)  SpO2: 99%   Body mass index is 44.01 kg/(m^2).  General: A&O x 3, WD, obese  Head: [place]/AT  Ear/Nose/Throat: Hearing grossly intact, nares w/o erythema or drainage, oropharynx w/o Erythema/Exudate  Eyes: PERRLA, EOMI  Neck: Supple, no nuchal rigidity, no palpable LAD  Pulmonary: Sym exp, good air movt, CTAB, no rales, rhonchi, & wheezing  Cardiac: RRR, Nl S1, S2, no Murmurs, rubs or gallops  Vascular: Vessel Right Left  Radial Palpable Palpable  Ulnar Palpable Palpable  Brachial Palpable Palpable  Carotid Palpable, without bruit Palpable, without bruit  Aorta Not palpable N/A  Femoral Palpable Palpable  Popliteal Not palpable Not palpable  PT not Palpable not Palpable  DP not Palpable not Palpable   Gastrointestinal: soft, NTND, -G/R, - HSM, - masses, - CVAT B  Musculoskeletal: M/S 5/5 throughout , B lower leg with chronic venous skin changes, L foot with ischemic great toe with skin breakdown between toes 1-2  Neurologic: CN 2-12 intact , Pain and light touch intact in extremities , Motor exam as listed above  Psychiatric: Judgment intact, Mood & affect appropriatefor pt's clinical situation  Dermatologic: See M/S exam for extremity exam, no rashes otherwise noted  Lymph : No Cervical, Axillary, or Inguinal lymphadenopathy   Outside Studies/Documentation 5 pages of outside documents were  reviewed including: outpatient pod records.  Medical Decision Making  [name] is a 67 y.o. male who presents with: LLE critical limb ischemia.   I discussed with the patient the natural history of critical limb ischemia: 25% require amputation in one year, 50% are able to maintain their limbs in one year, and 25-30% die in one year due to comorbidities.  Given the limb threatening status of this patient, I recommend an aggressive work up including proceeding with an: Aortogram, Bilateral runoff and intervention. I discussed with the patient the nature of angiographic procedures, especially the limited patencies of any endovascular intervention. The patient is aware of that the risks of an angiographic procedure include but are not limited to: bleeding, infection, access site complications, embolization, rupture of treated vessel, dissection, possible need for emergent surgical intervention, and possible need for surgical procedures to treat the patient's pathology. The patient is aware of the risks and agrees to proceed.  The procedure is scheduled for: 23 JAN 14. Given the severity of this patient's condition, I am going to expedite his work-up: R GSV mapping (L harvested for CABG), preop cardiology risk stratification and optimization, and BLE ABI with toe pressures to obtain baseline metrics.  I discussed in depth with the patient the nature of atherosclerosis, and emphasized the importance of maximal medical management including strict control of blood pressure, blood glucose, and lipid levels, antiplatelet agents, obtaining regular exercise, and cessation of smoking.  The patient is aware that without maximal medical management the underlying atherosclerotic disease process will progress, limiting the benefit of any interventions.  Thank you for allowing [place] to participate in this patient's care.  [name], MD [facility] [place] Office: [phone] Pager:  [phone]  04/24/2012, 3:52 PM

## 2012-04-30 NOTE — Progress Notes (Signed)
Assumed care of pt from Renee Richards, RN. Assessment documented. 

## 2012-04-30 NOTE — Interval H&P Note (Signed)
[facility] [place]  History and Physical Update  The patient was interviewed and re-examined.  The patient's previous History and Physical has been reviewed and is unchanged.  There is no change in the plan of care: Aortogram, bilateral leg runoff, and possible intervention left leg.  [name], MD [facility] [place] Office: [phone] Pager: [phone]  04/30/2012, 7:32 AM

## 2012-05-04 ENCOUNTER — Ambulatory Visit (HOSPITAL_COMMUNITY): Payer: Medicare Other | Attending: Cardiovascular Disease | Admitting: Radiology

## 2012-05-04 ENCOUNTER — Other Ambulatory Visit (HOSPITAL_COMMUNITY): Payer: Self-pay | Admitting: *Deleted

## 2012-05-04 ENCOUNTER — Ambulatory Visit (INDEPENDENT_AMBULATORY_CARE_PROVIDER_SITE_OTHER): Payer: Medicare Other | Admitting: Cardiology

## 2012-05-04 ENCOUNTER — Encounter: Payer: Self-pay | Admitting: Cardiology

## 2012-05-04 VITALS — BP 110/85 | HR 93

## 2012-05-04 VITALS — BP 124/84 | HR 86 | Ht 69.0 in | Wt 291.0 lb

## 2012-05-04 DIAGNOSIS — I509 Heart failure, unspecified: Secondary | ICD-10-CM | POA: Insufficient documentation

## 2012-05-04 DIAGNOSIS — I10 Essential (primary) hypertension: Secondary | ICD-10-CM | POA: Insufficient documentation

## 2012-05-04 DIAGNOSIS — I4891 Unspecified atrial fibrillation: Secondary | ICD-10-CM

## 2012-05-04 DIAGNOSIS — I5022 Chronic systolic (congestive) heart failure: Secondary | ICD-10-CM

## 2012-05-04 DIAGNOSIS — Z01810 Encounter for preprocedural cardiovascular examination: Secondary | ICD-10-CM

## 2012-05-04 DIAGNOSIS — E119 Type 2 diabetes mellitus without complications: Secondary | ICD-10-CM | POA: Insufficient documentation

## 2012-05-04 DIAGNOSIS — I2510 Atherosclerotic heart disease of native coronary artery without angina pectoris: Secondary | ICD-10-CM

## 2012-05-04 DIAGNOSIS — R0602 Shortness of breath: Secondary | ICD-10-CM

## 2012-05-04 DIAGNOSIS — I999 Unspecified disorder of circulatory system: Secondary | ICD-10-CM | POA: Insufficient documentation

## 2012-05-04 MED ORDER — TECHNETIUM TC 99M SESTAMIBI GENERIC - CARDIOLITE
11.7000 | Freq: Once | INTRAVENOUS | Status: AC | PRN
  Administered 2012-05-04: 12 via INTRAVENOUS

## 2012-05-04 MED ORDER — TECHNETIUM TC 99M SESTAMIBI GENERIC - CARDIOLITE
33.0000 | Freq: Once | INTRAVENOUS | Status: AC | PRN
  Administered 2012-05-04: 33 via INTRAVENOUS

## 2012-05-04 MED ORDER — REGADENOSON 0.4 MG/5ML IV SOLN
0.4000 mg | Freq: Once | INTRAVENOUS | Status: AC
  Administered 2012-05-04: 0.4 mg via INTRAVENOUS

## 2012-05-04 MED ORDER — METOPROLOL TARTRATE 1 MG/ML IV SOLN
5.0000 mg | Freq: Once | INTRAVENOUS | Status: AC
  Administered 2012-05-04: 5 mg via INTRAVENOUS

## 2012-05-04 NOTE — Patient Instructions (Addendum)
The current medical regimen is effective;  continue present plan and medications.  Make sure you take all medications as ordered  Follow up as previously scheduled

## 2012-05-04 NOTE — Progress Notes (Signed)
HPI The patient presents today for a stress perfusion imaging. However, he was in 21 atrial flutter with a rate of 170. He had not been taking his beta blockers for unclear reasons.  He has a history of CAD, s/p CABG in 2001, DM2, HL, atrial fibrillation, ischemic cardiomyopathy, systolic CHF. LHC 12/11: SVG-OM patent, SVG-RCA occluded, native RCA with severe diffuse disease and chronic total occlusion, LIMA-LAD patent, medical therapy pursued. Echocardiogram 10/13: Moderate LVH, EF 35-40%, mild MR, mild LAE, PASP 40. Last seen by Dr. Antoine Poche 10/13. He is off of Coumadin  due to concerns of his compliance, this was not restarted. He was recently evaluated by vascular surgery for left great toe gangrene. He needs angiography and possible intervention.  He might require surgery and was set up for the stress test preoperatively.    Today when he came in he was in atrial flutter with a rapid rate. However, he didn't feel this. He's not describing any tachycardia palpitations. He's not describing any chest pressure, neck or arm discomfort. He's not had any new shortness of breath, PND or orthopnea. The only thing he is really complaining about is pain in his foot.   Allergies  Allergen Reactions  . Statins     Muscle aches  . Amlodipine Besylate   . Cephalexin     REACTION: Hives    Current Outpatient Prescriptions  Medication Sig Dispense Refill  . colesevelam (WELCHOL) 625 MG tablet Take 3 tablets (1,875 mg total) by mouth 2 (two) times daily with a meal.  120 tablet  11  . HYDROcodone-acetaminophen (NORCO) 10-325 MG per tablet Take 1 tablet by mouth every 4 (four) hours as needed.      . insulin glargine (LANTUS SOLOSTAR) 100 UNIT/ML injection Inject 50 Units into the skin 2 (two) times daily. Please dispense a 90 day supply with refills to complete the year  10 pen  2  . lisinopril (PRINIVIL,ZESTRIL) 10 MG tablet Take 1 tablet (10 mg total) by mouth daily.  30 tablet  11  . metoprolol  succinate (TOPROL-XL) 50 MG 24 hr tablet Take 1 tablet (50 mg total) by mouth daily.  30 tablet  11  . nitroGLYCERIN (NITROSTAT) 0.4 MG SL tablet Place 1 tablet (0.4 mg total) under the tongue every 5 (five) minutes as needed.  25 tablet  11  . Saxagliptin-Metformin 2.08-998 MG TB24 Take 1 tablet by mouth 2 (two) times daily.  120 tablet  0    Past Medical History  Diagnosis Date  . Hyperlipidemia   . Diabetes mellitus   . CHF (congestive heart failure)     EF 40% 2010  . Paroxysmal atrial fibrillation   . Chronic anticoagulation   . Myocardial infarction   . Cataract     Past Surgical History  Procedure Date  . Coronary artery bypass graft     Most recent cath 2010. LIMA to the LAD patent, saphenous vein graft to an OM patent, saphenous vein graft to right coronary artery occluded.  . Revasculariztion     lower extremity    ROS:  As stated in the HPI and negative for all other systems.  PHYSICAL EXAM There were no vitals taken for this visit. GEN:  No distress NECK:  No jugular venous distention at 90 degrees, waveform within normal limits, carotid upstroke brisk and symmetric, no bruits, no thyromegaly LYMPHATICS:  No cervical adenopathy LUNGS:  Clear to auscultation bilaterally BACK:  No CVA tenderness CHEST:  Unremarkable HEART:  S1  and S2 within normal limits, no S3, no S4, no clicks, no rubs, no murmurs ABD:  Positive bowel sounds normal in frequency in pitch, no bruits, no rebound, no guarding, unable to assess midline mass or bruit with the patient seated. EXT:  2 plus pulses throughout, moderate edema, no cyanosis no clubbing SKIN:  No rashes no nodules NEURO:  Cranial nerves II through XII grossly intact, motor grossly intact throughout PSYCH:  Cognitively intact, oriented to person place and time   EKG:  Sinus rhythm, rate 94, possible right atrial enlargement, right axis deviation, interventricular conduction delay, old inferior infarct, possible inferolateral  ischemia with T-wave inversions unchanged from previous.  05/04/2012   ASSESSMENT AND PLAN  CAD  The patient will need stress perfusion imaging prior to his surgery. However, he needs to have his rate and rhythm control. This is addressed below.  ATRIAL FIBRILLATION, PAROXYSMAL  He was in atrial flutter today. We did give him IV beta blocker. He is back in sinus rhythm. He is told to go back on his oral beta blocker at the increased dose prescribed at the last visit.  The patient was supposed to be on Xarelto.  However, I don't see that he's been taking this. Since he will need to be held for pending procedures I won't restart it at this point but he does need to take this eventually.  CARDIOMYOPATHY  I will check an echocardiogram and restart the medications as above.  DM  His diabetes is very poorly controlled. This is being followed by his primary provider.  OBESITY  We have discussed diet.  DYSLIPIDEMIA His LDL recently was 191.7. However, he says he cannot take any of the statins.

## 2012-05-04 NOTE — Progress Notes (Signed)
[facility] SITE 3 NUCLEAR MED [address] [place], [place] [address] [phone]    Cardiology Nuclear Med Study  Leroy [name] is a 67 y.o. male     MRN : [id number]     DOB: [birth year]  Procedure Date: 05/04/2012  Nuclear Med Background Indication for Stress Test:  Evaluation for Ischemia, Graft Patency and Pending Clearance for Lower Extremity Vascular Surgery by Dr. [name] History: PAF,CHF, ICM, '01 MI> CABG x 3 ([place]), '11 MI> Cath: 2 patent grafts, SVG>RCA occluded, native RCA occluded with collaterals, EF=40%, 01-2012 Echo: EF=35-40%, moderate LVH Cardiac Risk Factors: Family History - CAD, History of Smoking, Hypertension, IDDM Type 2, Lipids and Obesity  Symptoms:  No cardiac symptoms   Nuclear Pre-Procedure Caffeine/Decaff Intake:  None> 12 hrs NPO After: 5:30am   Lungs:  Clear. O2 Sat: 96% on room air. IV 0.9% NS with Angio Cath:  24g  IV Site: R Hand, tolerated well IV Started by:  Irean Hong, RN  Chest Size (in):  50 Cup Size: n/a  Height: 5\' 9"  (1.753 m)  Weight:  291 lb (131.997 kg)  BMI:  Body mass index is 42.97 kg/(m^2). Tech Comments:  Toprol held x 3-4 days, per pt. Patient was in atrial flutter with RVR prior to stress portion of test. V/O to cancel stress portion for now and give IV metoprolol 5mg .  Dr. Antoine Poche to see pt. afterwards.  Patient returned in NSR and test was completed today.    Nuclear Med Study 1 or 2 day study: 1 day  Stress Test Type:  [name]  Reading MD: Kristeen Miss, MD  Order Authorizing Provider:  Rollene Rotunda, MD  Resting Radionuclide: Technetium 35m Sestamibi  Resting Radionuclide Dose: 11.0 mCi   Stress Radionuclide:  Technetium 75m Sestamibi  Stress Radionuclide Dose: 33.0 mCi           Stress Protocol Rest HR: 86 Stress HR: 104  Rest BP: ?124/84 very difficult to hear Stress BP: ? 119/57  Exercise Time (min): n/a METS: n/a   Predicted Max HR: 155 bpm % Max HR: 67.1 bpm Rate Pressure Product:  46962    Dose of Adenosine (mg):  n/a Dose of Lexiscan: 0.4 mg  Dose of Atropine (mg): n/a Dose of Dobutamine: n/a mcg/kg/min (at max HR)  Stress Test Technologist: Smiley Houseman, CMA-N  Nuclear Technologist:  Domenic Polite, CNMT     Rest Procedure:  Myocardial perfusion imaging was performed at rest 45 minutes following the intravenous administration of Technetium 41m Sestamibi.  Rest ECG: NSR with non-specific ST-T wave changes.    Stress Procedure:  The patient received IV Lexiscan 0.4 mg over 15-seconds.  Technetium 60m Sestamibi injected at 30-seconds.  Quantitative spect images were obtained after a 45 minute delay.  Stress ECG: No significant change from baseline ECG  QPS Raw Data Images:  Normal; no motion artifact; normal heart/lung ratio. Stress Images:  There is a large, severe defect in the apex and entire inferior wall.  The uptake in the septum and lateral wall is normal.    Rest Images:  There is a large, severe defect in the apex and mid/basal inferior wall.  The uptake in the septum and lateral wall is normal.  There is a very small  area in the apical inferior region where the uptake improves.   Subtraction (SDS):  There is evidence of a very large inferior apical MI with a very small area of peri-infarct ischemia toward the apical inferior wall.  Transient Ischemic Dilatation (Normal <1.22):  0.96 Lung/Heart Ratio (Normal <0.45):  0.28  Quantitative Gated Spect Images QGS EDV:  193 ml QGS ESV:  157 ml  Impression Exercise Capacity:  Lexiscan with no exercise. BP Response:  Normal blood pressure response. Clinical Symptoms:  No significant symptoms noted. ECG Impression:  No significant ST segment change suggestive of ischemia. Comparison with Prior Nuclear Study: No images to compare  Overall Impression:  High risk stress nuclear study.  He has had a large Inferior apical MI and has a very small area of peri-infarct ischemia toward the inferior apical  segment.  His LV function is markedly depressed at 19%.    LV Ejection Fraction: 19%.  LV Wall Motion:  There is severe global hypokinesis.    Vesta Mixer, Montez Hageman., MD, [facility] 05/04/2012, 4:40 PM Office - [phone] Pager [phone]

## 2012-05-07 ENCOUNTER — Encounter: Payer: Self-pay | Admitting: Vascular Surgery

## 2012-05-08 ENCOUNTER — Encounter: Payer: Self-pay | Admitting: Vascular Surgery

## 2012-05-08 ENCOUNTER — Encounter (INDEPENDENT_AMBULATORY_CARE_PROVIDER_SITE_OTHER): Payer: Medicare Other | Admitting: *Deleted

## 2012-05-08 ENCOUNTER — Telehealth: Payer: Self-pay | Admitting: *Deleted

## 2012-05-08 ENCOUNTER — Encounter: Payer: Medicare Other | Admitting: Vascular Surgery

## 2012-05-08 ENCOUNTER — Ambulatory Visit (INDEPENDENT_AMBULATORY_CARE_PROVIDER_SITE_OTHER): Payer: Medicare Other | Admitting: Vascular Surgery

## 2012-05-08 VITALS — BP 123/66 | HR 86 | Temp 98.2°F | Ht 69.0 in | Wt 291.0 lb

## 2012-05-08 DIAGNOSIS — I998 Other disorder of circulatory system: Secondary | ICD-10-CM

## 2012-05-08 DIAGNOSIS — Z01810 Encounter for preprocedural cardiovascular examination: Secondary | ICD-10-CM

## 2012-05-08 DIAGNOSIS — I70269 Atherosclerosis of native arteries of extremities with gangrene, unspecified extremity: Secondary | ICD-10-CM

## 2012-05-08 DIAGNOSIS — I739 Peripheral vascular disease, unspecified: Secondary | ICD-10-CM

## 2012-05-08 DIAGNOSIS — I999 Unspecified disorder of circulatory system: Secondary | ICD-10-CM

## 2012-05-08 DIAGNOSIS — R931 Abnormal findings on diagnostic imaging of heart and coronary circulation: Secondary | ICD-10-CM

## 2012-05-08 NOTE — Progress Notes (Signed)
[facility] [place]  Postoperative Visit  History of Present Illness  Leroy [name] is a 67 y.o. male who presents for postoperative follow-up from procedure on Date: Dx angiogram: 04/30/12 .  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  The patient notes no significant change in lower extremity symptoms.  The patient is able to complete his activities of daily living with some difficulty ambulating.  The patient's current symptoms are: pain in great toe with ambulation.  He denies any drainage or fever or chills.  The patient recently had nuclear stress testing.  This stress test was positive for significant disease with a decreased EF 20%.     Past Medical History, Past Surgical History, Social History, Family History, Medications, Allergies, and Review of Systems are unchanged from previous evaluation on 04/30/12.  Physical Examination  Filed Vitals:   05/08/12 0957  BP: 123/66  Pulse: 86  Temp: 98.2 F (36.8 C)  TempSrc: Oral  Height: 5\' 9"  (1.753 m)  Weight: 291 lb (131.997 kg)  SpO2: 100%   Body mass index is 42.97 kg/(m^2).  General: A&O x 3, WDWN  Pulmonary: Sym exp, good air movt, CTAB, no rales, rhonchi, & wheezing  Cardiac: RRR, Nl S1, S2, no Murmurs, rubs or gallops  Vascular:  L foot non-palpable pedals, Left great toe dry gangrene  Gastrointestinal: soft, NTND, -G/R, - HSM, - masses, - CVAT B  Musculoskeletal: M/S 5/5 except with pain, L great toe as noted above, R foot without any gangrene or ulcers  Neurologic:  Pain and light touch intact in extremities , Motor exam as listed above  Non-Invasive Vascular Imaging  ABI (Date: 05/08/12)  RLE: [place], PT absent, DP: monophasic, Toe 61   LLE: [place], PT: absent, DP: monophasic, Toe 0  RLE GSV Mapping (Date: 05/08/12)  Sclerotic and calcific segment throughout  Diameters: 2.8-5.1 mm  Likely inaequate for fem-tib bypass  Medical Decision Making  Leroy [name] is a 67 y.o. male who  presents s/p dx angiogram and s/p nuclear stress testing. Based on his angiographic findings, this patient needs: L CFA to AT bypass ideally with GSV.  Unfortunately, his GSV is unlikely to be usable due to sclerotic and calcific segments. Additionally, he has a positive stress test that is concerning for decreased EF down to ~20%, which is a considerable drop. At this point, I would proceed with cardiac work-up, as if he is a high risk for a cardiac event, I would recommend L BKA as I doubt he is going to have meaningful patency with prosthetic graft using in a fem-tibial bypass with compromised cardiac function. We will have him follow up in two weeks after we know better what is his next step in his cardiac work-up. We also discussed proceeding with a primary L BKA to minimize his cardiac risks.  He is considering this at this time. I gave him wound care instructions: wash L foot BID with soap and water.  Apply hydrogen peroxide to R great toe daily to help sterilize the dry gangrene. I discussed in depth with the patient the nature of atherosclerosis, and emphasized the importance of maximal medical management including strict control of blood pressure, blood glucose, and lipid levels, obtaining regular exercise, and cessation of smoking.  The patient is aware that without maximal medical management the underlying atherosclerotic disease process will progress, limiting the benefit of any interventions. I also gave him Oxycodone 5 mg 1-2 PO q4-6 hr prn pain #60 Refill #0  to manage his pain.  Thank you for allowing [place] to participate in this patient's care.  [name], MD [facility] [place] Office: [phone] Pager: [phone]

## 2012-05-08 NOTE — Telephone Encounter (Signed)
Message copied by Antony Odea on Fri May 08, 2012  4:10 PM ------      Message from: [place], [place] T      Created: Fri May 08, 2012 12:25 PM       Nuclear scan findings may be explained by known CAD.      EF much lower.      Discussed with Dr. Rollene Rotunda.      Recommend follow up echo to check EF.      Please schedule 2D echo as soon as we can (patient is being prepared for LE bypass with VVS).      Tereso Newcomer, PA-C  12:24 PM 05/08/2012

## 2012-05-08 NOTE — Telephone Encounter (Signed)
Called pt and explained need for echo, pt aware he will get a call to schedule.

## 2012-05-11 ENCOUNTER — Telehealth: Payer: Self-pay | Admitting: *Deleted

## 2012-05-11 NOTE — Telephone Encounter (Signed)
pt coming in tomorrow for echo and Dr. Antoine Poche

## 2012-05-11 NOTE — Telephone Encounter (Signed)
Message copied by Tarri Fuller on Mon May 11, 2012  4:11 PM ------      Message from: Antony Odea      Created: Fri May 08, 2012  4:13 PM       Needs echo asap per scott weaver, pt aware you will be calling, thank you

## 2012-05-12 ENCOUNTER — Ambulatory Visit (INDEPENDENT_AMBULATORY_CARE_PROVIDER_SITE_OTHER): Payer: Medicare Other | Admitting: Cardiology

## 2012-05-12 ENCOUNTER — Encounter: Payer: Self-pay | Admitting: Cardiology

## 2012-05-12 ENCOUNTER — Ambulatory Visit (HOSPITAL_COMMUNITY): Payer: Medicare HMO | Attending: Cardiology

## 2012-05-12 VITALS — BP 126/72 | HR 72 | Ht 69.0 in | Wt 295.0 lb

## 2012-05-12 DIAGNOSIS — I999 Unspecified disorder of circulatory system: Secondary | ICD-10-CM

## 2012-05-12 DIAGNOSIS — I2589 Other forms of chronic ischemic heart disease: Secondary | ICD-10-CM

## 2012-05-12 DIAGNOSIS — I998 Other disorder of circulatory system: Secondary | ICD-10-CM

## 2012-05-12 DIAGNOSIS — I4891 Unspecified atrial fibrillation: Secondary | ICD-10-CM

## 2012-05-12 DIAGNOSIS — E119 Type 2 diabetes mellitus without complications: Secondary | ICD-10-CM | POA: Insufficient documentation

## 2012-05-12 DIAGNOSIS — I059 Rheumatic mitral valve disease, unspecified: Secondary | ICD-10-CM | POA: Insufficient documentation

## 2012-05-12 DIAGNOSIS — E785 Hyperlipidemia, unspecified: Secondary | ICD-10-CM | POA: Insufficient documentation

## 2012-05-12 DIAGNOSIS — I2510 Atherosclerotic heart disease of native coronary artery without angina pectoris: Secondary | ICD-10-CM

## 2012-05-12 DIAGNOSIS — I70269 Atherosclerosis of native arteries of extremities with gangrene, unspecified extremity: Secondary | ICD-10-CM

## 2012-05-12 DIAGNOSIS — I428 Other cardiomyopathies: Secondary | ICD-10-CM

## 2012-05-12 DIAGNOSIS — I509 Heart failure, unspecified: Secondary | ICD-10-CM | POA: Insufficient documentation

## 2012-05-12 DIAGNOSIS — Z01810 Encounter for preprocedural cardiovascular examination: Secondary | ICD-10-CM | POA: Insufficient documentation

## 2012-05-12 DIAGNOSIS — I252 Old myocardial infarction: Secondary | ICD-10-CM | POA: Insufficient documentation

## 2012-05-12 DIAGNOSIS — R931 Abnormal findings on diagnostic imaging of heart and coronary circulation: Secondary | ICD-10-CM

## 2012-05-12 NOTE — Progress Notes (Signed)
HPI The patient returns for followup of atrial flutter. He had a stress perfusion study recently. However, when he presented he was in flutter. He had not been taking his beta blocker. He subsequently converted to sinus rhythm and went on to complete the stress test.   He has a history of CAD, s/p CABG in 2001, DM2, HL, atrial fibrillation, ischemic cardiomyopathy, systolic CHF. LHC 12/11: SVG-OM patent, SVG-RCA occluded, native RCA with severe diffuse disease and chronic total occlusion, LIMA-LAD patent, medical therapy pursued.   The stress test done recently demonstrated  A high risk stress nuclear study. He has had a large Inferior apical MI and has a very small area of peri-infarct ischemia toward the inferior apical segment. His LV function is markedly depressed at 19%. I brought him back today to followup on the atrial flutter. I also ordered an echocardiogram. I reviewed these images and compare them to his previous echo. His EF is probably about 25%. However, it does not appear to be severely changed from his previous echo.  Of note the patient is being considered for treatment of a nonhealing foot wound. Consideration has been given this possible surgical revascularization although this is apparently felt by Dr. Imogene Burn to be unlikely to be successful. He is also being considered for amputation of his left foot. I am evaluating him for risk stratification prior to possible surgery. He actually has not reported any chest pressure, neck or arm discomfort. He does not report any shortness of breath, PND or orthopnea. He didn't notice palpitations when he was in flutter. He still does not notice palpitations, presyncope or syncope. He is limited by his foot wound.  Also of importance the patient had been off medication secondary to cost. His most recent hemoglobin A1c a few weeks ago was 14! He says he is now back on his medications including his beta blocker and is compliant.   Allergies  Allergen  Reactions  . Statins     Muscle aches  . Amlodipine Besylate   . Cephalexin     REACTION: Hives    Current Outpatient Prescriptions  Medication Sig Dispense Refill  . colesevelam (WELCHOL) 625 MG tablet Take 3 tablets (1,875 mg total) by mouth 2 (two) times daily with a meal.  120 tablet  11  . insulin glargine (LANTUS SOLOSTAR) 100 UNIT/ML injection Inject 50 Units into the skin 2 (two) times daily. Please dispense a 90 day supply with refills to complete the year  10 pen  2  . lisinopril (PRINIVIL,ZESTRIL) 10 MG tablet Take 1 tablet (10 mg total) by mouth daily.  30 tablet  11  . metoprolol succinate (TOPROL-XL) 50 MG 24 hr tablet Take 1 tablet (50 mg total) by mouth daily.  30 tablet  11  . nitroGLYCERIN (NITROSTAT) 0.4 MG SL tablet Place 1 tablet (0.4 mg total) under the tongue every 5 (five) minutes as needed.  25 tablet  11  . Saxagliptin-Metformin 2.08-998 MG TB24 Take 1 tablet by mouth 2 (two) times daily.  120 tablet  0    Past Medical History  Diagnosis Date  . Hyperlipidemia   . Diabetes mellitus   . CHF (congestive heart failure)     EF 40% 2010  . Paroxysmal atrial fibrillation   . Chronic anticoagulation   . Myocardial infarction   . Cataract     Past Surgical History  Procedure Date  . Coronary artery bypass graft     Most recent cath 2010. LIMA to the  LAD patent, saphenous vein graft to an OM patent, saphenous vein graft to right coronary artery occluded.  . Revasculariztion     lower extremity    ROS:  As stated in the HPI and negative for all other systems.  PHYSICAL EXAM BP 126/72  Pulse 72  Ht 5\' 9"  (1.753 m)  Wt 295 lb (133.811 kg)  BMI 43.56 kg/m2 GEN:  No distress NECK:  No jugular venous distention at 45 degrees, waveform within normal limits, carotid upstroke brisk and symmetric, no bruits, no thyromegaly LYMPHATICS:  No cervical adenopathy LUNGS:  Clear to auscultation bilaterally BACK:  No CVA tenderness CHEST:  Unremarkable HEART:  S1  and S2 within normal limits, no S3, no S4, no clicks, no rubs, no murmurs ABD:  Positive bowel sounds normal in frequency in pitch, no bruits, no rebound, no guarding, unable to assess midline mass or bruit, obese EXT:  2 plus pulses lower, absent DP and PT, non healing foot ulcer, moderate edema, no cyanosis no clubbing SKIN:  No rashes no nodules NEURO:  Cranial nerves II through XII grossly intact, motor grossly intact throughout PSYCH:  Cognitively intact, oriented to person place and time   EKG:  Normal sinus rhythm, borderline interventricular conduction delay, old inferior infarct, lateral T-wave inversions unchanged from previous. 05/12/2012   ASSESSMENT AND PLAN  PREOPERATIVE EXAM The patient is at high risk given his known cardiomyopathy and poorly treated risk factors. However, there is no indication that further evaluation such as with cardiac catheterization could not be at risk. Rather optimize medical management is our best strategy. I discussed this with the patient and I will likewise relay this message to Dr. Imogene Burn.    CAD  As above.   ATRIAL FIBRILLATION, PAROXYSMAL  The patient is currently in sinus rhythm. He should be on long-term anticoagulation and was to be on Xarelto in the past. I would likely prescribe this again after he has had his surgery. Of course he will need to demonstrate medical compliance for this to be safe and to reduce his risk of stroke.  CARDIOMYOPATHY  He seems to be euvolemic.  At this point, no change in therapy is indicated.  We have reviewed salt and fluid restrictions.  No further cardiovascular testing is indicated.  DM  His diabetes is very poorly controlled. This is being followed by his primary provider.  DYSLIPIDEMIA His LDL recently was 191.7. However, he says he cannot take any of the statins.

## 2012-05-12 NOTE — Progress Notes (Signed)
Echocardiogram performed.  

## 2012-05-12 NOTE — Patient Instructions (Addendum)
The current medical regimen is effective;  continue present plan and medications.  Follow up in 6 months with Dr Hochrein.  You will receive a letter in the mail 2 months before you are due.  Please call us when you receive this letter to schedule your follow up appointment.  

## 2012-05-18 ENCOUNTER — Other Ambulatory Visit (HOSPITAL_COMMUNITY): Payer: Medicare Other

## 2012-05-19 ENCOUNTER — Other Ambulatory Visit (HOSPITAL_COMMUNITY): Payer: Medicare Other

## 2012-05-20 ENCOUNTER — Encounter: Payer: Self-pay | Admitting: Vascular Surgery

## 2012-05-22 ENCOUNTER — Ambulatory Visit (INDEPENDENT_AMBULATORY_CARE_PROVIDER_SITE_OTHER): Payer: Medicare Other | Admitting: Vascular Surgery

## 2012-05-22 ENCOUNTER — Encounter: Payer: Self-pay | Admitting: Vascular Surgery

## 2012-05-22 ENCOUNTER — Ambulatory Visit: Payer: Medicare Other | Admitting: Vascular Surgery

## 2012-05-22 VITALS — BP 123/73 | HR 87 | Resp 18 | Ht 69.0 in | Wt 292.0 lb

## 2012-05-22 DIAGNOSIS — I70269 Atherosclerosis of native arteries of extremities with gangrene, unspecified extremity: Secondary | ICD-10-CM

## 2012-05-22 DIAGNOSIS — I999 Unspecified disorder of circulatory system: Secondary | ICD-10-CM | POA: Insufficient documentation

## 2012-05-22 NOTE — Progress Notes (Signed)
[facility] [place]  Established Critical Limb Ischemia Patient  History of Present Illness  Leroy [name] is a 67 y.o. (03/10/1946) male who presents with chief complaint: follow up from cardiology.  The patient has rest pain and wounds include: Left great toe dry gangrene.  The patient notes symptoms have progressed.  The patient's treatment regimen currently included: maximal medical management and local wound care.  Preop risk stratification is high risk.  Past Medical History  Diagnosis Date  . Hyperlipidemia   . Diabetes mellitus   . CHF (congestive heart failure)     EF 40% 2010  . Paroxysmal atrial fibrillation   . Chronic anticoagulation   . Myocardial infarction   . Cataract     Past Surgical History  Procedure Laterality Date  . Coronary artery bypass graft      Most recent cath 2010. LIMA to the LAD patent, saphenous vein graft to an OM patent, saphenous vein graft to right coronary artery occluded.  . Revasculariztion      lower extremity    History   Social History  . Marital Status: Legally Separated    Spouse Name: N/A    Number of Children: N/A  . Years of Education: N/A   Occupational History  . Not on file.   Social History Main Topics  . Smoking status: Former Smoker    Types: Cigarettes    Quit date: 01/02/1992  . Smokeless tobacco: Never Used  . Alcohol Use: 1.8 oz/week    3 Cans of beer per week  . Drug Use: No  . Sexually Active: Not Currently   Other Topics Concern  . Not on file   Social History Narrative  . No narrative on file    Family History  Problem Relation Age of Onset  . Heart disease Mother     before age 60  . Diabetes Mother   . Heart attack Mother   . Heart disease Father     Current Outpatient Prescriptions on File Prior to Visit  Medication Sig Dispense Refill  . colesevelam (WELCHOL) 625 MG tablet Take 3 tablets (1,875 mg total) by mouth 2 (two) times daily with a meal.  120 tablet  11   . insulin glargine (LANTUS SOLOSTAR) 100 UNIT/ML injection Inject 50 Units into the skin 2 (two) times daily. Please dispense a 90 day supply with refills to complete the year  10 pen  2  . lisinopril (PRINIVIL,ZESTRIL) 10 MG tablet Take 1 tablet (10 mg total) by mouth daily.  30 tablet  11  . metoprolol succinate (TOPROL-XL) 50 MG 24 hr tablet Take 1 tablet (50 mg total) by mouth daily.  30 tablet  11  . nitroGLYCERIN (NITROSTAT) 0.4 MG SL tablet Place 1 tablet (0.4 mg total) under the tongue every 5 (five) minutes as needed.  25 tablet  11  . Saxagliptin-Metformin 2.08-998 MG TB24 Take 1 tablet by mouth 2 (two) times daily.  120 tablet  0   No current facility-administered medications on file prior to visit.    Allergies  Allergen Reactions  . Statins     Muscle aches  . Amlodipine Besylate   . Cephalexin     REACTION: Hives    Review of Systems (Positive items checked otherwise negative)  General: [ ] Weight loss, [ ] Weight gain, [ ]  Loss of appetite, [ ] Fever  Neurologic: [ ] Dizziness, [ ] Blackouts, [ ] Headaches, [ ] Seizure  Ear/Nose/Throat: [ ]   Change in eyesight, [ ] Change in hearing, [ ] Nose bleeds, [ ] Sore throat  Vascular: [x] Pain in legs with walking, [x] Pain in feet while lying flat, [x] Non-healing left great toe, [ ] Stroke, [ ] "Mini stroke", [ ] Slurred speech, [ ] Temporary blindness, [ ] Blood clot in vein, [ ] Phlebitis  Pulmonary: [ ] Home oxygen, [ ] Productive cough, [ ] Bronchitis, [ ] Coughing up blood, [ ] Asthma, [ ] Wheezing  Musculoskeletal: [ ] Arthritis, [ ] Joint pain, [ ] Muscle pain  Cardiac: [ ] Chest pain, [ ] Chest tightness/pressure, [ ] Shortness of breath when lying flat, [x] Shortness of breath with exertion, [ ] Palpitations, [ ] Heart murmur, [x] Arrhythmia,  [ ] Atrial fibrillation  Hematologic: [ ] Bleeding problems, [ ] Clotting disorder, [ ] Anemia  Psychiatric:  [ ] Depression, [ ] Anxiety, [ ] Attention deficit  disorder  Gastrointestinal:  [ ] Black stool,[ ]  Blood in stool, [ ] Peptic ulcer disease, [ ] Reflux, [ ] Hiatal hernia, [ ] Trouble swallowing, [ ] Diarrhea, [ ] Constipation  Urinary:  [ ] Kidney disease, [ ] Burning with urination, [ ] Frequent urination, [ ] Difficulty urinating  Skin: [ ] Ulcers, [ ] Rashes  Physical Examination  Filed Vitals:   05/22/12 1044  BP: 123/73  Pulse: 87  Resp: 18  Height: 5' 9" (1.753 m)  Weight: 292 lb (132.45 kg)   Body mass index is 43.1 kg/(m^2).   General: A&O x 3, WD, obese   Eyes: PERRLA, EOMI  Pulmonary: Sym exp, good air movt, CTAB, no rales, rhonchi, & wheezing  Cardiac: RRR, Nl S1, S2, no Murmurs, rubs or gallops  Vascular: Vessel Right Left  Radial Palpable Palpable  Ulnar Palpable Palpable  Brachial Palpable Palpable  Carotid Palpable, without bruit Palpable, without bruit  Aorta Not palpable N/A  Femoral Palpable Palpable  Popliteal Not palpable Not palpable  PT Not Palpable Not Palpable  DP Not Palpable Not Palpable   Gastrointestinal: soft, NTND, -G/R, - HSM, - masses, - CVAT B  Musculoskeletal: M/S 5/5 throughout , Extremities without ischemic changes except L great toe dry gangrene  Neurologic: CN 2-12 intact , Pain and light touch intact in extremities except decreased in feet, Motor exam as listed above  Medical Decision Making  [name] is a 67 y.o. 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   Based on the patient's vascular studies and examination, I have offered the patient: L BKA. I discussed in depth the nature of below-the-knee amputation with the patient, including risks, benefits, and alternatives.   The patient is aware that the risks of below-the-knee amputation include but are not limited to: bleeding, infection, myocardial infarction, stroke, death, failure to heal amputation wound, and possible need for more proximal amputation.   We also discussed 20-30%  failure rate with the BKA and need to convert to AKA, but I suspect he should heal without difficulty given adequate popliteal flow on the angiogram. The patient is aware of the risks and agrees proceed forward with the procedure on 26 FEB 14.  I discussed in depth with the patient the nature of atherosclerosis, and emphasized the importance of maximal medical management including strict control of blood pressure, blood glucose, and lipid levels, antiplatelet agents, obtaining regular exercise, and cessation of smoking.  The patient is aware that without maximal medical management   the underlying atherosclerotic disease process will progress, limiting the benefit of any interventions.  Thank you for allowing us to participate in this patient's care.  Brian Chen, MD [facility] [place] Office: [phone] Pager: [phone]  05/22/2012, 11:13 AM    

## 2012-05-28 ENCOUNTER — Other Ambulatory Visit: Payer: Self-pay | Admitting: *Deleted

## 2012-05-29 ENCOUNTER — Encounter (HOSPITAL_COMMUNITY): Payer: Self-pay | Admitting: Respiratory Therapy

## 2012-06-02 ENCOUNTER — Encounter (HOSPITAL_COMMUNITY)
Admission: RE | Admit: 2012-06-02 | Discharge: 2012-06-02 | Disposition: A | Discharge status: Home / Self Care | Payer: Medicare HMO | Source: Ambulatory Visit | Attending: Anesthesiology | Admitting: Anesthesiology

## 2012-06-02 ENCOUNTER — Encounter (HOSPITAL_COMMUNITY): Payer: Self-pay

## 2012-06-02 ENCOUNTER — Encounter (HOSPITAL_COMMUNITY)
Admission: RE | Admit: 2012-06-02 | Discharge: 2012-06-02 | Disposition: A | Discharge status: Home / Self Care | Payer: Medicare HMO | Source: Ambulatory Visit | Attending: Vascular Surgery | Admitting: Vascular Surgery

## 2012-06-02 HISTORY — DX: Polyneuropathy, unspecified: G62.9

## 2012-06-02 HISTORY — DX: Hypotension, unspecified: I95.9

## 2012-06-02 HISTORY — DX: Personal history of other diseases of the digestive system: Z87.19

## 2012-06-02 HISTORY — DX: Pain in unspecified joint: M25.50

## 2012-06-02 HISTORY — DX: Atherosclerotic heart disease of native coronary artery without angina pectoris: I25.10

## 2012-06-02 HISTORY — DX: Personal history of other venous thrombosis and embolism: Z86.718

## 2012-06-02 HISTORY — DX: Chronic kidney disease, unspecified: N18.9

## 2012-06-02 HISTORY — DX: Peripheral vascular disease, unspecified: I73.9

## 2012-06-02 LAB — URINALYSIS, ROUTINE W REFLEX MICROSCOPIC
Glucose, UA: 250 mg/dL — AB
Leukocytes, UA: NEGATIVE
pH: 5.5 (ref 5.0–8.0)

## 2012-06-02 LAB — APTT: aPTT: 30 seconds (ref 24–37)

## 2012-06-02 LAB — COMPREHENSIVE METABOLIC PANEL
Albumin: 3 g/dL — ABNORMAL LOW (ref 3.5–5.2)
Alkaline Phosphatase: 103 U/L (ref 39–117)
BUN: 16 mg/dL (ref 6–23)
Creatinine, Ser: 1.1 mg/dL (ref 0.50–1.35)
Potassium: 4.5 mEq/L (ref 3.5–5.1)
Total Protein: 7.2 g/dL (ref 6.0–8.3)

## 2012-06-02 LAB — URINE MICROSCOPIC-ADD ON

## 2012-06-02 LAB — SURGICAL PCR SCREEN
MRSA, PCR: NEGATIVE
Staphylococcus aureus: POSITIVE — AB

## 2012-06-02 LAB — PROTIME-INR
INR: 0.93 (ref 0.00–1.49)
Prothrombin Time: 12.4 seconds (ref 11.6–15.2)

## 2012-06-02 LAB — CBC
HCT: 43.3 % (ref 39.0–52.0)
MCHC: 35.8 g/dL (ref 30.0–36.0)
RDW: 13 % (ref 11.5–15.5)

## 2012-06-02 MED ORDER — VANCOMYCIN HCL 10 G IV SOLR
1500.0000 mg | INTRAVENOUS | Status: AC
  Administered 2012-06-03: 1500 mg via INTRAVENOUS
  Filled 2012-06-02: qty 1500

## 2012-06-02 NOTE — Progress Notes (Signed)
06/02/12 1116  OBSTRUCTIVE SLEEP APNEA  Have you ever been diagnosed with sleep apnea through a sleep study? No  Do you snore loudly (loud enough to be heard through closed doors)?  0  Do you often feel tired, fatigued, or sleepy during the daytime? 0  Has anyone observed you stop breathing during your sleep? 0  Do you have, or are you being treated for high blood pressure? 1  BMI more than 35 kg/m2? 1  Age over 66 years old? 1  Neck circumference greater than 40 cm/18 inches? (20 1/2)  Gender: 1  Obstructive Sleep Apnea Score 4  Score 4 or greater  Results sent to PCP

## 2012-06-02 NOTE — Anesthesia Preprocedure Evaluation (Addendum)
Anesthesia Evaluation  Patient identified by MRN, date of birth, ID band Patient awake    Reviewed: Allergy & Precautions, H&P , NPO status , Patient's Chart, lab work & pertinent test results  Airway Mallampati: III TM Distance: >3 FB Neck ROM: Full    Dental no notable dental hx. (+) Teeth Intact and Dental Advisory Given   Pulmonary neg pulmonary ROS,  breath sounds clear to auscultation  Pulmonary exam normal       Cardiovascular + CAD, + Past MI, + Peripheral Vascular Disease and +CHF negative cardio ROS  + dysrhythmias Atrial Fibrillation Rhythm:Regular Rate:Normal     Neuro/Psych  Neuromuscular disease negative psych ROS   GI/Hepatic negative GI ROS, Neg liver ROS,   Endo/Other  diabetes, Type 1, Insulin Dependent and Oral Hypoglycemic AgentsMorbid obesity  Renal/GU negative Renal ROS  negative genitourinary   Musculoskeletal   Abdominal   Peds  Hematology negative hematology ROS (+)   Anesthesia Other Findings   Reproductive/Obstetrics negative OB ROS                          Anesthesia Physical Anesthesia Plan  ASA: III  Anesthesia Plan: General   Post-op Pain Management:    Induction: Intravenous  Airway Management Planned: Oral ETT  Additional Equipment:   Intra-op Plan:   Post-operative Plan: Extubation in OR  Informed Consent: I have reviewed the patients History and Physical, chart, labs and discussed the procedure including the risks, benefits and alternatives for the proposed anesthesia with the patient or authorized representative who has indicated his/her understanding and acceptance.   Dental advisory given  Plan Discussed with: CRNA  Anesthesia Plan Comments: (See anesthesia note.  Shonna Chock, PA-C)       Anesthesia Quick Evaluation

## 2012-06-02 NOTE — Progress Notes (Signed)
Pt states that he is not currently taking Xarelto--has been off of it for about 2months

## 2012-06-02 NOTE — Pre-Procedure Instructions (Signed)
REGINOLD BEALE  06/02/2012   Your procedure is scheduled on:  Wed, Feb 26 @ 2:28 PM  Report to [facility] at 8:30 AM.  Call this number if you have problems the morning of surgery: [phone]   Remember:   Do not eat food or drink liquids after midnight.   Take these medicines the morning of surgery with A SIP OF WATER: Metoprolol(Toprol)   Do not wear jewelry  Do not wear lotions, powders, or colognes. You may wear deodorant.  Men may shave face and neck.  Do not bring valuables to the hospital.  Contacts, dentures or bridgework may not be worn into surgery.  Leave suitcase in the car. After surgery it may be brought to your room.  For patients admitted to the hospital, checkout time is 11:00 AM the day of  discharge.   Patients discharged the day of surgery will not be allowed to drive  home.    Special Instructions: Shower using CHG 2 nights before surgery and the night before surgery.  If you shower the day of surgery use CHG.  Use special wash - you have one bottle of CHG for all showers.  You should use approximately 1/3 of the bottle for each shower.   Please read over the following fact sheets that you were given: Pain Booklet, Coughing and Deep Breathing, MRSA Information and Surgical Site Infection Prevention

## 2012-06-02 NOTE — Progress Notes (Signed)
Cardiologist is Dr.Hochrein with last visit in epic from 05-12-12  Stress test in 05-04-12 with results in epic  Echo reports in epic from 01/31/12 and 05-12-12  Heart cath report in epic from 03-19-10 Aortagram in epic from 04-30-12  Dr.Thomas Jones with [facility] is Medical MD  EKG in epic from 05-12-12 Denies CXR being done within past yr

## 2012-06-02 NOTE — Progress Notes (Signed)
Pt states he hasn't had to use Nitroglycerin in about 4weeks-took it bc heart was racing but no chest pain;no chest pain noted since early 2013

## 2012-06-02 NOTE — Progress Notes (Signed)
Average fasting blood sugar around 220-230

## 2012-06-02 NOTE — Progress Notes (Signed)
Anesthesia chart review: Patient is a 67 year old male scheduled for left BKA by Dr. Imogene Burn on 06/03/12.  I was asked to review his chart following his PAT visit today.  BP was documented as 88/54 at PAT, but was not rechecked.  History includes morbid obesity, PAD, CAD/MI s/p CABG '01, paroxysmal atrial fibrillation/flutter, ischemic cardiomyopathy, chronic systolic CHF, poorly controlled diabetes mellitus type 2 with peripheral neuropathy, CKD, hiatal hernia, former smoker.  OSA screening score was 4.  PCP is Dr. [name].  Cardiologist is Dr. Antoine Poche, last visit on 05/412 for follow-up and preoperative risk stratification. EKG then showed Normal sinus rhythm, borderline interventricular conduction delay, old inferior infarct, lateral T-wave inversions unchanged from previous.  Evaluation included, "The patient is at high risk given his known cardiomyopathy and poorly treated risk factors. However, there is no indication that further evaluation such as with cardiac catheterization could not be at risk. Rather optimize medical management is our best strategy."  He did not recommend further cardiovascular testing preoperatively.  Echo on 05/12/12 showed: - Left ventricle: The cavity size was mildly dilated. Wall thickness was increased in a pattern of mild LVH. Systolic function was moderately to severely reduced. The estimated ejection fraction was in the range of 30% to 35%. Diffuse hypokinesis. Doppler parameters are consistent with abnormal left ventricular relaxation (grade 1 diastolic dysfunction). - Mitral valve: Calcified annulus. Mild regurgitation. - Left atrium: The atrium was moderately dilated. - Pulmonary arteries: Systolic pressure was mildly increased. PA peak pressure: 39mm Hg (S). - Trivial tricuspid regurgitation, trivial pulmonic regurgitation   Nuclear stress test on 05/04/12 showed: High risk stress nuclear study. He has had a large Inferior apical MI and has a very small area of  peri-infarct ischemia toward the inferior apical segment. His LV function is markedly depressed at 19%. LV Wall Motion: There is severe global hypokinesis. At this point Dr. Antoine Poche has not recommended repeating his cardiac cath as he felt it would not improve patient's risk.  Cardiac cath from 03/19/10 showed: SVG-OM patent, SVG-RCA occluded and fills via collateral vessels, native RCA with severe diffuse disease and chronic total occlusion, LIMA-LAD patent, medical therapy recommended.  CXR on 06/02/12 showed no evidence of acute cardiopulmonary disease.  Preoperative labs noted.  Cardiology is aware of planned procedure, but he is felt to be high risk as outlined above.   Dr. Antoine Poche discussed this with Mr. Arndt.  Dr. Imogene Burn has also evaluated Mr. Melendrez since his cardiology visit.  Patient will be evaluated by his anesthesiologist on the day of surgery, but if no acute changes then would anticipate he could proceed.  Shonna Chock, PA-C 06/02/12 1415

## 2012-06-03 ENCOUNTER — Encounter (HOSPITAL_COMMUNITY)
Admission: RE | Disposition: A | Discharge status: Skilled Nursing Facility | Payer: Self-pay | Source: Ambulatory Visit | Attending: Internal Medicine

## 2012-06-03 ENCOUNTER — Telehealth: Payer: Self-pay | Admitting: Vascular Surgery

## 2012-06-03 ENCOUNTER — Encounter (HOSPITAL_COMMUNITY): Payer: Self-pay | Admitting: Vascular Surgery

## 2012-06-03 ENCOUNTER — Encounter (HOSPITAL_COMMUNITY): Payer: Self-pay | Admitting: *Deleted

## 2012-06-03 ENCOUNTER — Inpatient Hospital Stay (HOSPITAL_COMMUNITY): Payer: Medicare HMO | Admitting: Vascular Surgery

## 2012-06-03 ENCOUNTER — Inpatient Hospital Stay (HOSPITAL_COMMUNITY)
Admission: RE | Admit: 2012-06-03 | Discharge: 2012-06-11 | DRG: 240 | Disposition: A | Discharge status: Skilled Nursing Facility | Payer: Medicare HMO | Source: Ambulatory Visit | Attending: Internal Medicine | Admitting: Internal Medicine

## 2012-06-03 DIAGNOSIS — Z87891 Personal history of nicotine dependence: Secondary | ICD-10-CM

## 2012-06-03 DIAGNOSIS — E785 Hyperlipidemia, unspecified: Secondary | ICD-10-CM | POA: Diagnosis present

## 2012-06-03 DIAGNOSIS — I10 Essential (primary) hypertension: Secondary | ICD-10-CM | POA: Diagnosis present

## 2012-06-03 DIAGNOSIS — L0390 Cellulitis, unspecified: Secondary | ICD-10-CM

## 2012-06-03 DIAGNOSIS — I2589 Other forms of chronic ischemic heart disease: Secondary | ICD-10-CM | POA: Diagnosis present

## 2012-06-03 DIAGNOSIS — I4891 Unspecified atrial fibrillation: Secondary | ICD-10-CM | POA: Diagnosis present

## 2012-06-03 DIAGNOSIS — I999 Unspecified disorder of circulatory system: Secondary | ICD-10-CM

## 2012-06-03 DIAGNOSIS — K5900 Constipation, unspecified: Secondary | ICD-10-CM | POA: Diagnosis not present

## 2012-06-03 DIAGNOSIS — I70229 Atherosclerosis of native arteries of extremities with rest pain, unspecified extremity: Secondary | ICD-10-CM | POA: Diagnosis present

## 2012-06-03 DIAGNOSIS — Z794 Long term (current) use of insulin: Secondary | ICD-10-CM

## 2012-06-03 DIAGNOSIS — L02419 Cutaneous abscess of limb, unspecified: Secondary | ICD-10-CM | POA: Diagnosis not present

## 2012-06-03 DIAGNOSIS — Z01812 Encounter for preprocedural laboratory examination: Secondary | ICD-10-CM

## 2012-06-03 DIAGNOSIS — I498 Other specified cardiac arrhythmias: Secondary | ICD-10-CM | POA: Diagnosis not present

## 2012-06-03 DIAGNOSIS — I509 Heart failure, unspecified: Secondary | ICD-10-CM | POA: Diagnosis present

## 2012-06-03 DIAGNOSIS — I480 Paroxysmal atrial fibrillation: Secondary | ICD-10-CM | POA: Diagnosis present

## 2012-06-03 DIAGNOSIS — Z833 Family history of diabetes mellitus: Secondary | ICD-10-CM

## 2012-06-03 DIAGNOSIS — E1149 Type 2 diabetes mellitus with other diabetic neurological complication: Secondary | ICD-10-CM | POA: Diagnosis present

## 2012-06-03 DIAGNOSIS — Z7982 Long term (current) use of aspirin: Secondary | ICD-10-CM

## 2012-06-03 DIAGNOSIS — Z8249 Family history of ischemic heart disease and other diseases of the circulatory system: Secondary | ICD-10-CM

## 2012-06-03 DIAGNOSIS — IMO0002 Reserved for concepts with insufficient information to code with codable children: Secondary | ICD-10-CM | POA: Diagnosis present

## 2012-06-03 DIAGNOSIS — Z79899 Other long term (current) drug therapy: Secondary | ICD-10-CM

## 2012-06-03 DIAGNOSIS — E7849 Other hyperlipidemia: Secondary | ICD-10-CM

## 2012-06-03 DIAGNOSIS — E1142 Type 2 diabetes mellitus with diabetic polyneuropathy: Secondary | ICD-10-CM | POA: Diagnosis present

## 2012-06-03 DIAGNOSIS — I70269 Atherosclerosis of native arteries of extremities with gangrene, unspecified extremity: Secondary | ICD-10-CM | POA: Diagnosis present

## 2012-06-03 DIAGNOSIS — I998 Other disorder of circulatory system: Secondary | ICD-10-CM

## 2012-06-03 DIAGNOSIS — E1165 Type 2 diabetes mellitus with hyperglycemia: Secondary | ICD-10-CM | POA: Diagnosis present

## 2012-06-03 DIAGNOSIS — Y9210 Unspecified residential institution as the place of occurrence of the external cause: Secondary | ICD-10-CM | POA: Diagnosis not present

## 2012-06-03 DIAGNOSIS — G547 Phantom limb syndrome without pain: Secondary | ICD-10-CM | POA: Diagnosis not present

## 2012-06-03 DIAGNOSIS — I471 Supraventricular tachycardia: Secondary | ICD-10-CM

## 2012-06-03 DIAGNOSIS — I428 Other cardiomyopathies: Secondary | ICD-10-CM

## 2012-06-03 DIAGNOSIS — Z7901 Long term (current) use of anticoagulants: Secondary | ICD-10-CM

## 2012-06-03 DIAGNOSIS — T8140XA Infection following a procedure, unspecified, initial encounter: Secondary | ICD-10-CM | POA: Diagnosis not present

## 2012-06-03 DIAGNOSIS — E669 Obesity, unspecified: Secondary | ICD-10-CM | POA: Diagnosis present

## 2012-06-03 DIAGNOSIS — I2510 Atherosclerotic heart disease of native coronary artery without angina pectoris: Secondary | ICD-10-CM | POA: Diagnosis present

## 2012-06-03 DIAGNOSIS — E1159 Type 2 diabetes mellitus with other circulatory complications: Principal | ICD-10-CM | POA: Diagnosis present

## 2012-06-03 DIAGNOSIS — I252 Old myocardial infarction: Secondary | ICD-10-CM

## 2012-06-03 DIAGNOSIS — B351 Tinea unguium: Secondary | ICD-10-CM

## 2012-06-03 DIAGNOSIS — Z951 Presence of aortocoronary bypass graft: Secondary | ICD-10-CM

## 2012-06-03 DIAGNOSIS — Y835 Amputation of limb(s) as the cause of abnormal reaction of the patient, or of later complication, without mention of misadventure at the time of the procedure: Secondary | ICD-10-CM | POA: Diagnosis not present

## 2012-06-03 DIAGNOSIS — R5082 Postprocedural fever: Secondary | ICD-10-CM

## 2012-06-03 DIAGNOSIS — Z6831 Body mass index (BMI) 31.0-31.9, adult: Secondary | ICD-10-CM

## 2012-06-03 HISTORY — PX: AMPUTATION: SHX166

## 2012-06-03 LAB — GLUCOSE, CAPILLARY
Glucose-Capillary: 175 mg/dL — ABNORMAL HIGH (ref 70–99)
Glucose-Capillary: 252 mg/dL — ABNORMAL HIGH (ref 70–99)

## 2012-06-03 LAB — HEMOGLOBIN A1C: Mean Plasma Glucose: 278 mg/dL — ABNORMAL HIGH (ref <117)

## 2012-06-03 SURGERY — AMPUTATION BELOW KNEE
Anesthesia: General | Site: Leg Lower | Laterality: Left | Wound class: Clean

## 2012-06-03 MED ORDER — DIPHENHYDRAMINE HCL 12.5 MG/5ML PO ELIX
12.5000 mg | ORAL_SOLUTION | Freq: Four times a day (QID) | ORAL | Status: DC | PRN
  Filled 2012-06-03: qty 5

## 2012-06-03 MED ORDER — INSULIN ASPART 100 UNIT/ML ~~LOC~~ SOLN
0.0000 [IU] | Freq: Three times a day (TID) | SUBCUTANEOUS | Status: DC
  Administered 2012-06-04: 5 [IU] via SUBCUTANEOUS
  Administered 2012-06-04: 3 [IU] via SUBCUTANEOUS
  Administered 2012-06-05: 5 [IU] via SUBCUTANEOUS
  Administered 2012-06-05: 2 [IU] via SUBCUTANEOUS
  Administered 2012-06-05: 3 [IU] via SUBCUTANEOUS
  Administered 2012-06-06 – 2012-06-11 (×5): 2 [IU] via SUBCUTANEOUS

## 2012-06-03 MED ORDER — PANTOPRAZOLE SODIUM 40 MG PO TBEC
40.0000 mg | DELAYED_RELEASE_TABLET | Freq: Every day | ORAL | Status: DC
  Administered 2012-06-04 – 2012-06-11 (×6): 40 mg via ORAL
  Filled 2012-06-03 (×5): qty 1

## 2012-06-03 MED ORDER — ONDANSETRON HCL 4 MG/2ML IJ SOLN: 4.0000 mg | Freq: Four times a day (QID) | INTRAMUSCULAR | Status: DC | PRN

## 2012-06-03 MED ORDER — ASPIRIN 325 MG PO TABS
325.0000 mg | ORAL_TABLET | Freq: Every day | ORAL | Status: DC
  Administered 2012-06-04 – 2012-06-11 (×8): 325 mg via ORAL
  Filled 2012-06-03 (×8): qty 1

## 2012-06-03 MED ORDER — DIPHENHYDRAMINE HCL 50 MG/ML IJ SOLN: 12.5000 mg | Freq: Four times a day (QID) | INTRAMUSCULAR | Status: DC | PRN

## 2012-06-03 MED ORDER — GLYCOPYRROLATE 0.2 MG/ML IJ SOLN
INTRAMUSCULAR | Status: DC | PRN
  Administered 2012-06-03: .2 mg via INTRAVENOUS

## 2012-06-03 MED ORDER — LABETALOL HCL 5 MG/ML IV SOLN
10.0000 mg | INTRAVENOUS | Status: DC | PRN
  Filled 2012-06-03: qty 4

## 2012-06-03 MED ORDER — LACTATED RINGERS IV SOLN
INTRAVENOUS | Status: DC | PRN
  Administered 2012-06-03 (×2): via INTRAVENOUS

## 2012-06-03 MED ORDER — SAXAGLIPTIN-METFORMIN ER 2.5-1000 MG PO TB24: 1.0000 | ORAL_TABLET | Freq: Two times a day (BID) | ORAL | Status: DC

## 2012-06-03 MED ORDER — LINAGLIPTIN 5 MG PO TABS
5.0000 mg | ORAL_TABLET | Freq: Every day | ORAL | Status: DC
  Administered 2012-06-04 – 2012-06-11 (×8): 5 mg via ORAL
  Filled 2012-06-03 (×9): qty 1

## 2012-06-03 MED ORDER — MAGNESIUM HYDROXIDE 400 MG/5ML PO SUSP
30.0000 mL | Freq: Every day | ORAL | Status: DC | PRN
  Administered 2012-06-06: 30 mL via ORAL
  Filled 2012-06-03: qty 30

## 2012-06-03 MED ORDER — METOPROLOL TARTRATE 1 MG/ML IV SOLN
2.0000 mg | INTRAVENOUS | Status: DC | PRN
  Administered 2012-06-07: 5 mg via INTRAVENOUS
  Filled 2012-06-03 (×3): qty 5

## 2012-06-03 MED ORDER — HYDROMORPHONE HCL PF 1 MG/ML IJ SOLN: 0.2500 mg | INTRAMUSCULAR | Status: DC | PRN

## 2012-06-03 MED ORDER — PROPOFOL 10 MG/ML IV BOLUS
INTRAVENOUS | Status: DC | PRN
  Administered 2012-06-03: 110 mg via INTRAVENOUS

## 2012-06-03 MED ORDER — MORPHINE SULFATE (PF) 1 MG/ML IV SOLN
INTRAVENOUS | Status: DC
  Administered 2012-06-03: 22:00:00 via INTRAVENOUS
  Administered 2012-06-04: 1 mg via INTRAVENOUS
  Administered 2012-06-04 (×2): 5 mg via INTRAVENOUS
  Administered 2012-06-04: 4.6 mg via INTRAVENOUS
  Administered 2012-06-04: 6 mg via INTRAVENOUS
  Administered 2012-06-05: 4.5 mg via INTRAVENOUS
  Filled 2012-06-03 (×2): qty 25

## 2012-06-03 MED ORDER — ALUM & MAG HYDROXIDE-SIMETH 200-200-20 MG/5ML PO SUSP: 15.0000 mL | ORAL | Status: DC | PRN

## 2012-06-03 MED ORDER — LACTATED RINGERS IV SOLN
INTRAVENOUS | Status: DC
  Administered 2012-06-03: 10:00:00 via INTRAVENOUS

## 2012-06-03 MED ORDER — NEOSTIGMINE METHYLSULFATE 1 MG/ML IJ SOLN
INTRAMUSCULAR | Status: DC | PRN
  Administered 2012-06-03: 2 mg via INTRAVENOUS

## 2012-06-03 MED ORDER — ACETAMINOPHEN 650 MG RE SUPP: 325.0000 mg | RECTAL | Status: DC | PRN

## 2012-06-03 MED ORDER — NITROGLYCERIN 0.4 MG SL SUBL: 0.4000 mg | SUBLINGUAL_TABLET | SUBLINGUAL | Status: DC | PRN

## 2012-06-03 MED ORDER — FENTANYL CITRATE 0.05 MG/ML IJ SOLN
INTRAMUSCULAR | Status: AC
  Administered 2012-06-03: 100 ug
  Filled 2012-06-03: qty 2

## 2012-06-03 MED ORDER — METOPROLOL SUCCINATE ER 50 MG PO TB24
50.0000 mg | ORAL_TABLET | Freq: Once | ORAL | Status: AC
  Administered 2012-06-03: 50 mg via ORAL
  Filled 2012-06-03: qty 1

## 2012-06-03 MED ORDER — HYDRALAZINE HCL 20 MG/ML IJ SOLN
10.0000 mg | INTRAMUSCULAR | Status: DC | PRN
  Filled 2012-06-03: qty 0.5

## 2012-06-03 MED ORDER — ONDANSETRON HCL 4 MG/2ML IJ SOLN
INTRAMUSCULAR | Status: DC | PRN
  Administered 2012-06-03: 4 mg via INTRAVENOUS

## 2012-06-03 MED ORDER — LISINOPRIL 10 MG PO TABS: 10.0000 mg | ORAL_TABLET | Freq: Every day | ORAL | Status: DC

## 2012-06-03 MED ORDER — HYDROMORPHONE HCL PF 1 MG/ML IJ SOLN
0.5000 mg | INTRAMUSCULAR | Status: DC | PRN
  Administered 2012-06-03: 0.5 mg via INTRAVENOUS
  Administered 2012-06-03: 1 mg via INTRAVENOUS
  Administered 2012-06-03: 0.5 mg via INTRAVENOUS
  Filled 2012-06-03 (×3): qty 1

## 2012-06-03 MED ORDER — GUAIFENESIN-DM 100-10 MG/5ML PO SYRP: 15.0000 mL | ORAL_SOLUTION | ORAL | Status: DC | PRN

## 2012-06-03 MED ORDER — OXYCODONE HCL 5 MG PO TABS: 5.0000 mg | ORAL_TABLET | Freq: Once | ORAL | Status: DC | PRN

## 2012-06-03 MED ORDER — NALOXONE HCL 0.4 MG/ML IJ SOLN: 0.4000 mg | INTRAMUSCULAR | Status: DC | PRN

## 2012-06-03 MED ORDER — SODIUM CHLORIDE 0.9 % IJ SOLN: 9.0000 mL | INTRAMUSCULAR | Status: DC | PRN

## 2012-06-03 MED ORDER — SODIUM CHLORIDE 0.9 % IV SOLN
10.0000 mg | INTRAVENOUS | Status: DC | PRN
  Administered 2012-06-03: 5 ug/min via INTRAVENOUS

## 2012-06-03 MED ORDER — DOCUSATE SODIUM 100 MG PO CAPS
100.0000 mg | ORAL_CAPSULE | Freq: Every day | ORAL | Status: DC
  Administered 2012-06-04 – 2012-06-11 (×8): 100 mg via ORAL
  Filled 2012-06-03 (×8): qty 1

## 2012-06-03 MED ORDER — METOPROLOL SUCCINATE ER 50 MG PO TB24
50.0000 mg | ORAL_TABLET | Freq: Every day | ORAL | Status: DC
  Administered 2012-06-04 – 2012-06-08 (×5): 50 mg via ORAL
  Filled 2012-06-03 (×5): qty 1

## 2012-06-03 MED ORDER — LABETALOL HCL 5 MG/ML IV SOLN
INTRAVENOUS | Status: DC | PRN
  Administered 2012-06-03 (×3): 2.5 mg via INTRAVENOUS

## 2012-06-03 MED ORDER — ENOXAPARIN SODIUM 30 MG/0.3ML ~~LOC~~ SOLN
40.0000 mg | SUBCUTANEOUS | Status: DC
  Filled 2012-06-03: qty 0.4

## 2012-06-03 MED ORDER — ACETAMINOPHEN 325 MG PO TABS
325.0000 mg | ORAL_TABLET | ORAL | Status: DC | PRN
  Administered 2012-06-06: 650 mg via ORAL
  Filled 2012-06-03: qty 2

## 2012-06-03 MED ORDER — ALBUMIN HUMAN 5 % IV SOLN
INTRAVENOUS | Status: AC
  Filled 2012-06-03: qty 250

## 2012-06-03 MED ORDER — INSULIN GLARGINE 100 UNIT/ML ~~LOC~~ SOLN
50.0000 [IU] | Freq: Two times a day (BID) | SUBCUTANEOUS | Status: DC
  Administered 2012-06-03: 22:00:00 via SUBCUTANEOUS
  Administered 2012-06-04 – 2012-06-07 (×7): 50 [IU] via SUBCUTANEOUS
  Administered 2012-06-07: 23:00:00 via SUBCUTANEOUS
  Administered 2012-06-08 – 2012-06-10 (×4): 50 [IU] via SUBCUTANEOUS

## 2012-06-03 MED ORDER — FENTANYL CITRATE 0.05 MG/ML IJ SOLN
INTRAMUSCULAR | Status: DC | PRN
  Administered 2012-06-03 (×2): 100 ug via INTRAVENOUS
  Administered 2012-06-03: 50 ug via INTRAVENOUS

## 2012-06-03 MED ORDER — OXYCODONE HCL 5 MG PO TABS
5.0000 mg | ORAL_TABLET | ORAL | Status: DC | PRN
  Administered 2012-06-03 – 2012-06-11 (×16): 10 mg via ORAL
  Filled 2012-06-03 (×17): qty 2

## 2012-06-03 MED ORDER — COLESEVELAM HCL 625 MG PO TABS
1875.0000 mg | ORAL_TABLET | Freq: Two times a day (BID) | ORAL | Status: DC
  Administered 2012-06-04 – 2012-06-11 (×15): 1875 mg via ORAL
  Filled 2012-06-03 (×17): qty 3

## 2012-06-03 MED ORDER — METFORMIN HCL 500 MG PO TABS
1000.0000 mg | ORAL_TABLET | Freq: Two times a day (BID) | ORAL | Status: DC
  Administered 2012-06-04 – 2012-06-07 (×8): 1000 mg via ORAL
  Filled 2012-06-03 (×9): qty 2

## 2012-06-03 MED ORDER — ROCURONIUM BROMIDE 100 MG/10ML IV SOLN
INTRAVENOUS | Status: DC | PRN
  Administered 2012-06-03: 50 mg via INTRAVENOUS

## 2012-06-03 MED ORDER — SODIUM CHLORIDE 0.9 % IV SOLN
INTRAVENOUS | Status: DC
  Administered 2012-06-03: 15:00:00 via INTRAVENOUS

## 2012-06-03 MED ORDER — ENOXAPARIN SODIUM 40 MG/0.4ML ~~LOC~~ SOLN
40.0000 mg | SUBCUTANEOUS | Status: DC
  Administered 2012-06-04 – 2012-06-11 (×8): 40 mg via SUBCUTANEOUS
  Filled 2012-06-03 (×10): qty 0.4

## 2012-06-03 MED ORDER — 0.9 % SODIUM CHLORIDE (POUR BTL) OPTIME
TOPICAL | Status: DC | PRN
  Administered 2012-06-03: 1000 mL

## 2012-06-03 MED ORDER — FLEET ENEMA 7-19 GM/118ML RE ENEM
1.0000 | ENEMA | Freq: Once | RECTAL | Status: AC | PRN
  Filled 2012-06-03: qty 1

## 2012-06-03 MED ORDER — INSULIN ASPART 100 UNIT/ML ~~LOC~~ SOLN
0.0000 [IU] | Freq: Every day | SUBCUTANEOUS | Status: DC
  Administered 2012-06-03: 22:00:00 via SUBCUTANEOUS

## 2012-06-03 MED ORDER — MIDAZOLAM HCL 5 MG/5ML IJ SOLN
INTRAMUSCULAR | Status: DC | PRN
  Administered 2012-06-03 (×2): 1 mg via INTRAVENOUS

## 2012-06-03 MED ORDER — ZOLPIDEM TARTRATE 5 MG PO TABS: 5.0000 mg | ORAL_TABLET | Freq: Every evening | ORAL | Status: DC | PRN

## 2012-06-03 MED ORDER — OXYCODONE HCL 5 MG/5ML PO SOLN: 5.0000 mg | Freq: Once | ORAL | Status: DC | PRN

## 2012-06-03 MED ORDER — PHENOL 1.4 % MT LIQD
1.0000 | OROMUCOSAL | Status: DC | PRN
  Filled 2012-06-03 (×2): qty 177

## 2012-06-03 MED ORDER — ALBUMIN HUMAN 5 % IV SOLN
12.5000 g | Freq: Once | INTRAVENOUS | Status: AC
  Administered 2012-06-03: 12.5 g via INTRAVENOUS

## 2012-06-03 MED ORDER — BISACODYL 10 MG RE SUPP: 10.0000 mg | Freq: Every day | RECTAL | Status: DC | PRN

## 2012-06-03 MED ORDER — SODIUM CHLORIDE 0.9 % IV SOLN: INTRAVENOUS | Status: DC

## 2012-06-03 MED ORDER — LIDOCAINE HCL (CARDIAC) 20 MG/ML IV SOLN
INTRAVENOUS | Status: DC | PRN
  Administered 2012-06-03: 60 mg via INTRAVENOUS

## 2012-06-03 SURGICAL SUPPLY — 59 items
BANDAGE ELASTIC 4 VELCRO ST LF (GAUZE/BANDAGES/DRESSINGS) ×2 IMPLANT
BANDAGE ELASTIC 6 VELCRO ST LF (GAUZE/BANDAGES/DRESSINGS) ×2 IMPLANT
BANDAGE ESMARK 6X9 LF (GAUZE/BANDAGES/DRESSINGS) IMPLANT
BANDAGE GAUZE ELAST BULKY 4 IN (GAUZE/BANDAGES/DRESSINGS) ×3 IMPLANT
BLADE SAW SAG 29X58X.64 (BLADE) ×2 IMPLANT
BNDG CMPR 9X6 STRL LF SNTH (GAUZE/BANDAGES/DRESSINGS) ×1
BNDG COHESIVE 6X5 TAN STRL LF (GAUZE/BANDAGES/DRESSINGS) ×2 IMPLANT
BNDG ESMARK 6X9 LF (GAUZE/BANDAGES/DRESSINGS) ×2
CANISTER SUCTION 2500CC (MISCELLANEOUS) ×2 IMPLANT
CLIP TI MEDIUM 6 (CLIP) ×2 IMPLANT
CLOTH BEACON ORANGE TIMEOUT ST (SAFETY) ×2 IMPLANT
COVER SURGICAL LIGHT HANDLE (MISCELLANEOUS) ×2 IMPLANT
COVER TABLE BACK 60X90 (DRAPES) ×2 IMPLANT
CUFF TOURNIQUET SINGLE 24IN (TOURNIQUET CUFF) IMPLANT
CUFF TOURNIQUET SINGLE 34IN LL (TOURNIQUET CUFF) IMPLANT
CUFF TOURNIQUET SINGLE 44IN (TOURNIQUET CUFF) ×1 IMPLANT
DRAIN CHANNEL 19F RND (DRAIN) IMPLANT
DRAPE ORTHO SPLIT 77X108 STRL (DRAPES) ×4
DRAPE PROXIMA HALF (DRAPES) ×2 IMPLANT
DRAPE SURG ORHT 6 SPLT 77X108 (DRAPES) ×2 IMPLANT
DRSG ADAPTIC 3X8 NADH LF (GAUZE/BANDAGES/DRESSINGS) ×2 IMPLANT
ELECT REM PT RETURN 9FT ADLT (ELECTROSURGICAL) ×2
ELECTRODE REM PT RTRN 9FT ADLT (ELECTROSURGICAL) ×1 IMPLANT
EVACUATOR SILICONE 100CC (DRAIN) IMPLANT
GLOVE BIO SURGEON STRL SZ 6.5 (GLOVE) ×2 IMPLANT
GLOVE BIO SURGEON STRL SZ7 (GLOVE) ×3 IMPLANT
GLOVE BIOGEL PI IND STRL 6.5 (GLOVE) IMPLANT
GLOVE BIOGEL PI IND STRL 7.0 (GLOVE) IMPLANT
GLOVE BIOGEL PI IND STRL 7.5 (GLOVE) ×1 IMPLANT
GLOVE BIOGEL PI INDICATOR 6.5 (GLOVE) ×2
GLOVE BIOGEL PI INDICATOR 7.0 (GLOVE) ×1
GLOVE BIOGEL PI INDICATOR 7.5 (GLOVE) ×1
GLOVE ECLIPSE 6.0 STRL STRAW (GLOVE) ×1 IMPLANT
GLOVE ECLIPSE 6.5 STRL STRAW (GLOVE) ×1 IMPLANT
GOWN STRL NON-REIN LRG LVL3 (GOWN DISPOSABLE) ×7 IMPLANT
KIT BASIN OR (CUSTOM PROCEDURE TRAY) ×2 IMPLANT
KIT ROOM TURNOVER OR (KITS) ×2 IMPLANT
NS IRRIG 1000ML POUR BTL (IV SOLUTION) ×2 IMPLANT
PACK GENERAL/GYN (CUSTOM PROCEDURE TRAY) ×2 IMPLANT
PAD ARMBOARD 7.5X6 YLW CONV (MISCELLANEOUS) ×4 IMPLANT
PADDING CAST COTTON 6X4 STRL (CAST SUPPLIES) IMPLANT
SAW GIGLI STERILE 20 (MISCELLANEOUS) ×1 IMPLANT
SPONGE GAUZE 4X4 12PLY (GAUZE/BANDAGES/DRESSINGS) ×3 IMPLANT
SPONGE LAP 18X18 X RAY DECT (DISPOSABLE) ×1 IMPLANT
STAPLER VISISTAT 35W (STAPLE) ×3 IMPLANT
STOCKINETTE IMPERVIOUS LG (DRAPES) ×2 IMPLANT
SUT ETHILON 3 0 PS 1 (SUTURE) IMPLANT
SUT SILK 0 TIES 10X30 (SUTURE) ×1 IMPLANT
SUT SILK 2 0 (SUTURE) ×4
SUT SILK 2-0 18XBRD TIE 12 (SUTURE) ×1 IMPLANT
SUT VIC AB 2-0 CT1 18 (SUTURE) ×6 IMPLANT
SUT VIC AB 3-0 SH 18 (SUTURE) ×1 IMPLANT
SUT VIC AB 3-0 SH 27 (SUTURE) ×2
SUT VIC AB 3-0 SH 27X BRD (SUTURE) IMPLANT
TAPE UMBILICAL COTTON 1/8X30 (MISCELLANEOUS) ×1 IMPLANT
TOWEL OR 17X24 6PK STRL BLUE (TOWEL DISPOSABLE) ×2 IMPLANT
TOWEL OR 17X26 10 PK STRL BLUE (TOWEL DISPOSABLE) ×2 IMPLANT
UNDERPAD 30X30 INCONTINENT (UNDERPADS AND DIAPERS) ×2 IMPLANT
WATER STERILE IRR 1000ML POUR (IV SOLUTION) ×2 IMPLANT

## 2012-06-03 NOTE — Interval H&P Note (Signed)
History and Physical Interval Note:  06/03/2012 11:14 AM  Leroy [name]  has presented today for surgery, with the diagnosis of PVD W/ GANGRENE  The various methods of treatment have been discussed with the patient and family. After consideration of risks, benefits and other options for treatment, the patient has consented to  Procedure(s): AMPUTATION BELOW KNEE (Left) as a surgical intervention .  The patient's history has been reviewed, patient examined, no change in status, stable for surgery.  I have reviewed the patient's chart and labs.  Questions were answered to the patient's satisfaction.     Hien Perreira LIANG-YU

## 2012-06-03 NOTE — Progress Notes (Signed)
Pt c/o pain not being resolved with pain medication ordered, call MD on call. Orders given.

## 2012-06-03 NOTE — Anesthesia Procedure Notes (Signed)
Procedure Name: Intubation Date/Time: 06/03/2012 12:10 PM Performed by: Armandina Gemma Pre-anesthesia Checklist: Patient identified, Emergency Drugs available, Suction available, Patient being monitored and Timeout performed Patient Re-evaluated:Patient Re-evaluated prior to inductionOxygen Delivery Method: Circle system utilized Preoxygenation: Pre-oxygenation with 100% oxygen Intubation Type: IV induction Ventilation: Mask ventilation without difficulty and Oral airway inserted - appropriate to patient size Laryngoscope Size: Hyacinth Meeker and 2 Grade View: Grade I Tube type: Oral Tube size: 7.5 mm Number of attempts: 1 Airway Equipment and Method: Patient positioned with wedge pillow Placement Confirmation: ETT inserted through vocal cords under direct vision,  positive ETCO2 and breath sounds checked- equal and bilateral (bilat BS Fitzgerald) Secured at: 23 cm Tube secured with: Tape (atraumatic ) Dental Injury: Teeth and Oropharynx as per pre-operative assessment

## 2012-06-03 NOTE — Progress Notes (Signed)
Transport ready to take pt to holding area.  Awaiting metoprolol to be sent from pharmacy.  Called Pam in holding, metoprolol to be given in holding area.

## 2012-06-03 NOTE — Anesthesia Postprocedure Evaluation (Signed)
  Anesthesia Post-op Note  Patient: Leroy [name]  Procedure(s) Performed: Procedure(s): AMPUTATION BELOW KNEE (Left)  Patient Location: PACU  Anesthesia Type:General  Level of Consciousness: awake and alert   Airway and Oxygen Therapy: Patient Spontanous Breathing  Post-op Pain: none  Post-op Assessment: Post-op Vital signs reviewed, Patient's Cardiovascular Status Stable, Respiratory Function Stable, Patent Airway and No signs of Nausea or vomiting  Post-op Vital Signs: Reviewed and stable  Complications: No apparent anesthesia complications

## 2012-06-03 NOTE — H&P (Signed)
[facility] History and Physical  DONTELL MIAN ZOX:[id number] DOB: [birth year] DOA: 06/03/2012  Referring physician: Dr. Imogene Burn  PCP: [name], MD  Specialists: [facility]   Chief Complaint: admitted for elective left BKA  HPI: ADEMIDE SCHABERG is a 67 y.o. male with obesity, uncontrolled diabetes, severe peripheral vascular disease was admitted today for elective left BKA. Patient is seen postoperatively in his hospital room. At the current time he denies chest pain or dyspnea but complains of severe stump pain.    Review of Systems: The patient denies anorexia, fever, weight loss,, vision loss, decreased hearing, hoarseness, chest pain, syncope, dyspnea on exertion, peripheral edema, balance deficits, hemoptysis, abdominal pain, melena, hematochezia, severe indigestion/heartburn, hematuria, incontinence, genital sores, muscle weakness, suspicious skin lesions, transient blindness, depression, unusual weight change, abnormal bleeding, enlarged lymph nodes, angioedema, and breast masses.    Past Medical History  Diagnosis Date  . Hyperlipidemia   . CHF (congestive heart failure)     EF 40% 2010  . Chronic anticoagulation   . Cataract   . Hypotension   . Paroxysmal atrial fibrillation     takes Metoprolol daily  . Coronary artery disease   . Peripheral vascular disease   . History of blood clots     in legs--this was in 2011--has been off of Coumadin 42months;takes Xerelto daily  . Peripheral neuropathy   . Joint pain   . H/O hiatal hernia   . Chronic kidney disease     on Lisinopril to protect kidneys d/t being on Metformin per pt  . Diabetes mellitus     takes Metformni daily  and Lantus 50units bid  . Myocardial infarction     total of 8 heart attacks;last one in 2011   Past Surgical History  Procedure Laterality Date  . Revasculariztion  2011/2010    x 2   . Coronary artery bypass graft  03/2000    quadruple  . Tonsillectomy    . Hernia repair    .  Cardiac catheterization  03/19/10  . Abdominal aortagram  04-30-12  . Coronary angioplasty      3 stents  . Adenoidectomy     Social History:  reports that he has quit smoking. His smoking use included Cigarettes. He smoked 0.00 packs per day. He has never used smokeless tobacco. He reports that  drinks alcohol. He reports that he does not use illicit drugs. Patient lives at home with his family   Allergies  Allergen Reactions  . Statins     Muscle aches  . Amlodipine Besylate   . Cephalexin     REACTION: Hives    Family History  Problem Relation Age of Onset  . Heart disease Mother     before age 19  . Diabetes Mother   . Heart attack Mother   . Heart disease Father      Prior to Admission medications   Medication Sig Start Date End Date Taking? Authorizing Provider  aspirin 325 MG tablet Take 325 mg by mouth daily.   Yes Historical Provider, MD  colesevelam [facility]) 625 MG tablet Take 3 tablets (1,875 mg total) by mouth 2 (two) times daily with a meal. 04/15/12  Yes Etta Grandchild, MD  insulin glargine (LANTUS SOLOSTAR) 100 UNIT/ML injection Inject 50 Units into the skin 2 (two) times daily. Please dispense a 90 day supply with refills to complete the year 04/28/12  Yes Etta Grandchild, MD  lisinopril (PRINIVIL,ZESTRIL) 10 MG tablet Take 1 tablet (10  mg total) by mouth daily. 01/21/12  Yes Rollene Rotunda, MD  metoprolol succinate (TOPROL-XL) 50 MG 24 hr tablet Take 1 tablet (50 mg total) by mouth daily. 04/28/12  Yes Beatrice Lecher, PA  nitroGLYCERIN (NITROSTAT) 0.4 MG SL tablet Place 1 tablet (0.4 mg total) under the tongue every 5 (five) minutes as needed. 01/21/12  Yes Rollene Rotunda, MD  Saxagliptin-Metformin 2.08-998 MG TB24 Take 1 tablet by mouth 2 (two) times daily. 01/02/12  Yes Etta Grandchild, MD   Physical Exam: Filed Vitals:   06/03/12 1530 06/03/12 1545 06/03/12 1600 06/03/12 1638  BP: 105/36 119/44 129/48 115/75  Pulse: 57 65 65 71  Temp:   97.5 F (36.4 C) 97.3 F  (36.3 C)  TempSrc:    Oral  Resp: 8 19 19 18   SpO2: 94% 95% 97% 99%     General:  Alert and oriented x3  Eyes: pupils of 2 mm symmetric and reactive to light, extraocular movement is intact  ENT: normal appearing external ears  Neck: no jugular venous distention  Cardiovascular: distant sounds, normal S1-S2, regular  Respiratory: clear to auscultation bilaterally  Abdomen: distended, nontender, bowel sounds are quiet  Skin: no significant rashes  Musculoskeletal: status post left BKA  Psychiatric: euthymic  Neurologic: intact  Labs on Admission:  Basic Metabolic Panel:  Recent Labs Lab 06/02/12 1127  NA 138  K 4.5  CL 101  CO2 27  GLUCOSE 226*  BUN 16  CREATININE 1.10  CALCIUM 9.9   Liver Function Tests:  Recent Labs Lab 06/02/12 1127  AST 13  ALT 13  ALKPHOS 103  BILITOT 0.2*  PROT 7.2  ALBUMIN 3.0*   No results found for this basename: LIPASE, AMYLASE,  in the last 168 hours No results found for this basename: AMMONIA,  in the last 168 hours CBC:  Recent Labs Lab 06/02/12 1127  WBC 6.5  HGB 15.5  HCT 43.3  MCV 86.1  PLT 193   Cardiac Enzymes: No results found for this basename: CKTOTAL, CKMB, CKMBINDEX, TROPONINI,  in the last 168 hours  BNP (last 3 results) No results found for this basename: PROBNP,  in the last 8760 hours CBG:  Recent Labs Lab 06/03/12 0932 06/03/12 1009 06/03/12 1500  GLUCAP 218* 188* 164*    Radiological Exams on Admission: Dg Chest 2 View  06/02/2012  *RADIOLOGY REPORT*  Clinical Data: Left below-knee amputation  CHEST - 2 VIEW  Comparison: 03/16/2010  Findings: Lungs are clear.  No focal consolidation. No pleural effusion or pneumothorax.  The heart is top normal in size.  Mild degenerative changes of the visualized thoracolumbar spine.  IMPRESSION: No evidence of acute cardiopulmonary disease.   Original Report Authenticated By: Charline Bills, M.D.      Assessment/Plan Active Problems:   CAD    CARDIOMYOPATHY   ATRIAL FIBRILLATION, PAROXYSMAL   Type II or unspecified type diabetes mellitus with neurological manifestations, uncontrolled(250.62)   Atherosclerosis of native arteries of the extremities with gangrene   Critical lower limb ischemia   1. Status post left BKA-management per vascular surgery. Start physical therapy tomorrow 2. Diabetes mellitus type 2-uncontrolled-last hemoglobin A1c in the system higher than 14. Patient placed back on his Lantus 50 units twice a day and will check CBGs 4 times a day. 3. Ischemic cardiomyopathy-for now hold the ACE inhibitor as the patient has postoperative hypotension related to narcotic use. Continue beta blocker. Resume ACE inhibitor once blood pressure allows Code Status: full code ( Family Communication:  son at bedside  Disposition Plan: ?   Vianny Schraeder [facility] Pager [phone]  If 7PM-7AM, please contact night-coverage [email or web address] Password [facility] 06/03/2012, 4:47 PM

## 2012-06-03 NOTE — Op Note (Signed)
OPERATIVE NOTE   PROCEDURE: left below-the-knee amputation  PRE-OPERATIVE DIAGNOSIS: left foot gangrene  POST-OPERATIVE DIAGNOSIS: same as above  SURGEON: [name], MD  ASSISTANT(S): Doreatha Massed, PAC   ANESTHESIA: general  ESTIMATED BLOOD LOSS: 200 cc  FINDING(S): 1.  Left lower limb edema with good perfusion through calf 2.  Severely calcified tibial arteries 3.  Extensive calf musculature  SPECIMEN(S):  left below-the-knee amputation  INDICATIONS:   YOON BARCA is a 67 y.o. male who presents with left foot gangrene.  The patient's angiogram demonstrate tibial occlusion with distal posterior tibial artery reconstitution.  Unfortunately, the patient's cardiac function had deteriorated substantially and his preoperative risk stratification was found to be high.  Subsequently, I offered the patient a left below-the-knee amputation.  I discussed in depth with the patient the risks, benefits, and alternatives to this procedure.  The patient is aware that the risk of this operation included but are not limited to:  bleeding, infection, myocardial infarction, stroke, death, failure to heal amputation wound, and possible need for more proximal amputation.  The patient is aware of the risks and agrees proceed forward with the procedure.  DESCRIPTION:  After full informed written consent was obtained from the patient, the patient was brought back to the operating room, and placed supine upon the operating table.  Prior to induction, the patient received IV antibiotics.  The patient was then prepped and draped in the standard fashion for a below-the-knee amputation. fter obtaining adequate anesthesia, the patient was prepped and draped in the standard fashion for a below-the-knee amputation.  I marked out the anterior incision 10 cm distal to the tibial tuberosity and then the marked out a posterior flap that was one third of the circumference  of the calf in length.  I then placed a  sterile tourniquet on the upper thigh and exsanguinated the leg with a Esmarch bandage and then inflated the tourniquet to 250 mm Hg.   I made the incisions for these flaps, and then dissected through the subcutaneous tissue, fascia, and muscle anteriorly.  I elevated  the periosteal tissue superiorly so that the tibia was about 3 cm shorter than the anterior skin flap.  I then transected the tibia with a power saw and then took a wedge off the tibia anteriorly with the power saw.  Then I smoothed out the rough edges with a rasp.  In a similar fashion, I cut back the fibula about two centimeters higher than the level of the tibia with a bone cutter.  I put a bone hook into the distal tibia and then used a Lister knife to sharply develop a tissue plane through the muscle along the fibula.  In such fashion, the posterior flap was developed.  At this point, the specimen was passed off the field as the below-the-knee amputation.  At this point, I clamped all visibly bleeding arteries and veins using a combination of suture ligation with Vicryl suture and electrocautery.  The tourniquet was then deflated at this point.  Bleeding continued to be controlled with electrocautery and suture ligature.  The stump was washed off with sterile normal saline and no further active bleeding was noted.  Due to the extensive calf musculature and soft tissue swelling, I felt the calf muscle needed to be debrided further.  Using the [employer], I sharply debulked the calf musculature further.  I reapproximated the anterior and posterior fascia  with interrupted stitches of 2-0 Vicryl.  This was completed along the entire  length of anterior and posterior fascia until there were no more loose space in the fascial line.  I also reapproximated the superficial subcutaneous tissue along the incision with a running stitch of 3-0 Vicryl.  The skin was then reapproximated with staples.  The stump was washed off and dried.  The incision was  dressed with Adpatec and  then fluffs were applied.  Kerlix was wrapped around the leg and then gently an ACE wrap was applied.    COMPLICATIONS: none  CONDITION: stable   [name], MD [facility] Office: [phone] Pager: [phone]  06/03/2012, 2:45 PM

## 2012-06-03 NOTE — Preoperative (Signed)
Beta Blockers   Reason not to administer Beta Blockers:Not Applicable 

## 2012-06-03 NOTE — Telephone Encounter (Signed)
Message copied by Fredrich Birks on Wed Jun 03, 2012  3:20 PM ------      Message from: Melene Plan      Created: Wed Jun 03, 2012  3:10 PM                   ----- Message -----         From: [name], MD         Sent: 06/03/2012   2:59 PM           To: Reuel Derby, Melene Plan, RN            Leroy [name]      [id number]      01-Sep-1945            Procedure: L BKA            Asst: Doreatha Massed, PAC             Follow-up: 4 weeks ------

## 2012-06-03 NOTE — Transfer of Care (Signed)
Immediate Anesthesia Transfer of Care Note  Patient: Leroy [name]  Procedure(s) Performed: Procedure(s): AMPUTATION BELOW KNEE (Left)  Patient Location: PACU  Anesthesia Type:General  Level of Consciousness: sedated  Airway & Oxygen Therapy: Patient Spontanous Breathing and Patient connected to nasal cannula oxygen  Post-op Assessment: Report given to PACU RN and Post -op Vital signs reviewed and stable  Post vital signs: Reviewed and stable  Complications: No apparent anesthesia complications

## 2012-06-03 NOTE — H&P (View-Only) (Signed)
[facility] [place]  Established Critical Limb Ischemia Patient  History of Present Illness  Leroy [name] is a 67 y.o. (1946-03-21) male who presents with chief complaint: follow up from cardiology.  The patient has rest pain and wounds include: Left great toe dry gangrene.  The patient notes symptoms have progressed.  The patient's treatment regimen currently included: maximal medical management and local wound care.  Preop risk stratification is high risk.  Past Medical History  Diagnosis Date  . Hyperlipidemia   . Diabetes mellitus   . CHF (congestive heart failure)     EF 40% 2010  . Paroxysmal atrial fibrillation   . Chronic anticoagulation   . Myocardial infarction   . Cataract     Past Surgical History  Procedure Laterality Date  . Coronary artery bypass graft      Most recent cath 2010. LIMA to the LAD patent, saphenous vein graft to an OM patent, saphenous vein graft to right coronary artery occluded.  . Revasculariztion      lower extremity    History   Social History  . Marital Status: Legally Separated    Spouse Name: N/A    Number of Children: N/A  . Years of Education: N/A   Occupational History  . Not on file.   Social History Main Topics  . Smoking status: Former Smoker    Types: Cigarettes    Quit date: 01/02/1992  . Smokeless tobacco: Never Used  . Alcohol Use: 1.8 oz/week    3 Cans of beer per week  . Drug Use: No  . Sexually Active: Not Currently   Other Topics Concern  . Not on file   Social History Narrative  . No narrative on file    Family History  Problem Relation Age of Onset  . Heart disease Mother     before age 16  . Diabetes Mother   . Heart attack Mother   . Heart disease Father     Current Outpatient Prescriptions on File Prior to Visit  Medication Sig Dispense Refill  . colesevelam (WELCHOL) 625 MG tablet Take 3 tablets (1,875 mg total) by mouth 2 (two) times daily with a meal.  120 tablet  11   . insulin glargine (LANTUS SOLOSTAR) 100 UNIT/ML injection Inject 50 Units into the skin 2 (two) times daily. Please dispense a 90 day supply with refills to complete the year  10 pen  2  . lisinopril (PRINIVIL,ZESTRIL) 10 MG tablet Take 1 tablet (10 mg total) by mouth daily.  30 tablet  11  . metoprolol succinate (TOPROL-XL) 50 MG 24 hr tablet Take 1 tablet (50 mg total) by mouth daily.  30 tablet  11  . nitroGLYCERIN (NITROSTAT) 0.4 MG SL tablet Place 1 tablet (0.4 mg total) under the tongue every 5 (five) minutes as needed.  25 tablet  11  . Saxagliptin-Metformin 2.08-998 MG TB24 Take 1 tablet by mouth 2 (two) times daily.  120 tablet  0   No current facility-administered medications on file prior to visit.    Allergies  Allergen Reactions  . Statins     Muscle aches  . Amlodipine Besylate   . Cephalexin     REACTION: Hives    Review of Systems (Positive items checked otherwise negative)  General: [ ]  Weight loss, [ ]  Weight gain, [ ]   Loss of appetite, [ ]  Fever  Neurologic: [ ]  Dizziness, [ ]  Blackouts, [ ]  Headaches, [ ]  Seizure  Ear/Nose/Throat: [ ]   Change in eyesight, [ ]  Change in hearing, [ ]  Nose bleeds, [ ]  Sore throat  Vascular: [x]  Pain in legs with walking, [x]  Pain in feet while lying flat, [x]  Non-healing left great toe, [ ]  Stroke, [ ]  "Mini stroke", [ ]  Slurred speech, [ ]  Temporary blindness, [ ]  Blood clot in vein, [ ]  Phlebitis  Pulmonary: [ ]  Home oxygen, [ ]  Productive cough, [ ]  Bronchitis, [ ]  Coughing up blood, [ ]  Asthma, [ ]  Wheezing  Musculoskeletal: [ ]  Arthritis, [ ]  Joint pain, [ ]  Muscle pain  Cardiac: [ ]  Chest pain, [ ]  Chest tightness/pressure, [ ]  Shortness of breath when lying flat, [x]  Shortness of breath with exertion, [ ]  Palpitations, [ ]  Heart murmur, [x]  Arrhythmia,  [ ]  Atrial fibrillation  Hematologic: [ ]  Bleeding problems, [ ]  Clotting disorder, [ ]  Anemia  Psychiatric:  [ ]  Depression, [ ]  Anxiety, [ ]  Attention deficit  disorder  Gastrointestinal:  [ ]  Black stool,[ ]   Blood in stool, [ ]  Peptic ulcer disease, [ ]  Reflux, [ ]  Hiatal hernia, [ ]  Trouble swallowing, [ ]  Diarrhea, [ ]  Constipation  Urinary:  [ ]  Kidney disease, [ ]  Burning with urination, [ ]  Frequent urination, [ ]  Difficulty urinating  Skin: [ ]  Ulcers, [ ]  Rashes  Physical Examination  Filed Vitals:   05/22/12 1044  BP: 123/73  Pulse: 87  Resp: 18  Height: 5\' 9"  (1.753 m)  Weight: 292 lb (132.45 kg)   Body mass index is 43.1 kg/(m^2).   General: A&O x 3, WD, obese   Eyes: PERRLA, EOMI  Pulmonary: Sym exp, good air movt, CTAB, no rales, rhonchi, & wheezing  Cardiac: RRR, Nl S1, S2, no Murmurs, rubs or gallops  Vascular: Vessel Right Left  Radial Palpable Palpable  Ulnar Palpable Palpable  Brachial Palpable Palpable  Carotid Palpable, without bruit Palpable, without bruit  Aorta Not palpable N/A  Femoral Palpable Palpable  Popliteal Not palpable Not palpable  PT Not Palpable Not Palpable  DP Not Palpable Not Palpable   Gastrointestinal: soft, NTND, -G/R, - HSM, - masses, - CVAT B  Musculoskeletal: M/S 5/5 throughout , Extremities without ischemic changes except L great toe dry gangrene  Neurologic: CN 2-12 intact , Pain and light touch intact in extremities except decreased in feet, Motor exam as listed above  Medical Decision Making  Leroy [name] is a 67 y.o. 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   Based on the patient's vascular studies and examination, I have offered the patient: L BKA. I discussed in depth the nature of below-the-knee amputation with the patient, including risks, benefits, and alternatives.   The patient is aware that the risks of below-the-knee amputation include but are not limited to: bleeding, infection, myocardial infarction, stroke, death, failure to heal amputation wound, and possible need for more proximal amputation.   We also discussed 20-30%  failure rate with the BKA and need to convert to AKA, but I suspect he should heal without difficulty given adequate popliteal flow on the angiogram. The patient is aware of the risks and agrees proceed forward with the procedure on 26 FEB 14.  I discussed in depth with the patient the nature of atherosclerosis, and emphasized the importance of maximal medical management including strict control of blood pressure, blood glucose, and lipid levels, antiplatelet agents, obtaining regular exercise, and cessation of smoking.  The patient is aware that without maximal medical management  the underlying atherosclerotic disease process will progress, limiting the benefit of any interventions.  Thank you for allowing [place] to participate in this patient's care.  [name], MD [facility] [place] Office: [phone] Pager: [phone]  05/22/2012, 11:13 AM

## 2012-06-03 NOTE — Telephone Encounter (Signed)
Left message on home phone regarding appt, dpm

## 2012-06-03 NOTE — Progress Notes (Signed)
Pt positive for staph on PCR swab.  Patient used mupirocin x2 doses Tues (06/02/12) in PM, and Wed (today) AM.  Pt needs to complete course of mupirocin.   Pt did not take metoprolol at home prior to surgery.  Will order and administer prior to surgery per anesthesia protocol.

## 2012-06-04 ENCOUNTER — Encounter (HOSPITAL_COMMUNITY): Payer: Self-pay | Admitting: Vascular Surgery

## 2012-06-04 ENCOUNTER — Encounter: Payer: Medicare Other | Admitting: Cardiology

## 2012-06-04 DIAGNOSIS — S88119A Complete traumatic amputation at level between knee and ankle, unspecified lower leg, initial encounter: Secondary | ICD-10-CM

## 2012-06-04 DIAGNOSIS — I739 Peripheral vascular disease, unspecified: Secondary | ICD-10-CM

## 2012-06-04 DIAGNOSIS — L98499 Non-pressure chronic ulcer of skin of other sites with unspecified severity: Secondary | ICD-10-CM

## 2012-06-04 LAB — GLUCOSE, CAPILLARY: Glucose-Capillary: 183 mg/dL — ABNORMAL HIGH (ref 70–99)

## 2012-06-04 LAB — CBC
Hemoglobin: 14.9 g/dL (ref 13.0–17.0)
MCH: 30.6 pg (ref 26.0–34.0)
Platelets: 171 10*3/uL (ref 150–400)
RBC: 4.87 MIL/uL (ref 4.22–5.81)
WBC: 10.1 10*3/uL (ref 4.0–10.5)

## 2012-06-04 LAB — BASIC METABOLIC PANEL
Calcium: 9.4 mg/dL (ref 8.4–10.5)
GFR calc Af Amer: 81 mL/min — ABNORMAL LOW (ref >90)
GFR calc non Af Amer: 70 mL/min — ABNORMAL LOW (ref >90)
Potassium: 4.1 mEq/L (ref 3.5–5.1)
Sodium: 134 mEq/L — ABNORMAL LOW (ref 135–145)

## 2012-06-04 NOTE — Progress Notes (Signed)
Occupational Therapy Treatment Patient Details Name: JURGEN GROENEVELD MRN: [id number] DOB: [birth year] Today's Date: 06/04/2012 Time: 1191-4782 OT Time Calculation (min): 8 min  OT Assessment / Plan / Recommendation Comments on Treatment Session Pt with decreased awareness when attempting to get back to the EOB.  Pt needs total assist + 2 for sit to stand and all transfers at this time.  Recommend continued rehab at CIR level if 24 hour supervision/assist available at home.    Follow Up Recommendations  CIR    Barriers to Discharge  Decreased caregiver support    Equipment Recommendations  Tub/shower bench;3 in 1 bedside comode    Recommendations for Other Services Rehab consult  Frequency Min 2X/week   Plan Discharge plan remains appropriate    Precautions / Restrictions Precautions Precautions: Fall Restrictions Weight Bearing Restrictions: Yes LLE Weight Bearing: Non weight bearing   Pertinent Vitals/Pain Vitals stable, no pain reported this session    ADL  Eating/Feeding: Performed;Set up Where Assessed - Eating/Feeding: Edge of bed Grooming: Simulated;Set up Where Assessed - Grooming: Unsupported sitting Upper Body Bathing: Simulated;Set up Where Assessed - Upper Body Bathing: Unsupported sitting Lower Body Bathing: Simulated;+2 Total assistance Lower Body Bathing: Patient Percentage: 30% Where Assessed - Lower Body Bathing: Supported sit to stand Upper Body Dressing: Simulated;Set up Where Assessed - Upper Body Dressing: Unsupported sitting Lower Body Dressing: Simulated;+2 Total assistance Lower Body Dressing: Patient Percentage: 30% Where Assessed - Lower Body Dressing: Supported sit to stand Toilet Transfer: Simulated;+2 Total assistance Toilet Transfer: Patient Percentage: 30% Statistician Method: Ambulance person: Other (comment) (from bedside chair to EOB) Toileting - Clothing Manipulation and Hygiene: Simulated;+2 Total  assistance Toileting - Clothing Manipulation and Hygiene: Patient Percentage: 30% Where Assessed - Toileting Clothing Manipulation and Hygiene: Sit to stand from 3-in-1 or toilet Tub/Shower Transfer Method: Not assessed Equipment Used: Rolling walker Transfers/Ambulation Related to ADLs: Pt needed total assist +2 (pt 30%) for pivot transfer to bed from bedside chair. ADL Comments: Pt with decreased awareness attempting to transfer from bedside chair to EOB without adequate assistance.      OT Diagnosis: Generalized weakness;Cognitive deficits  OT Problem List: Decreased strength;Decreased activity tolerance;Decreased cognition;Decreased safety awareness;Decreased knowledge of use of DME or AE;Pain;Obesity OT Treatment Interventions: Self-care/ADL training;DME and/or AE instruction;Therapeutic activities;Balance training;Patient/family education;Cognitive remediation/compensation   OT Goals Acute Rehab OT Goals OT Goal Formulation: With patient ADL Goals Pt Will Perform Lower Body Bathing: with mod assist;Sit to stand from bed;with adaptive equipment ADL Goal: Lower Body Bathing - Progress: Goal set today Pt Will Perform Lower Body Dressing: with mod assist;Sit to stand from bed;with adaptive equipment ADL Goal: Lower Body Dressing - Progress: Goal set today Pt Will Transfer to Toilet: with mod assist;3-in-1;Squat pivot transfer ADL Goal: Toilet Transfer - Progress: Not progressing Pt Will Perform Toileting - Clothing Manipulation: with mod assist;Sitting on 3-in-1 or toilet;Standing ADL Goal: Toileting - Clothing Manipulation - Progress: Goal set today Pt Will Perform Toileting - Hygiene: with mod assist;Sit to stand from 3-in-1/toilet ADL Goal: Toileting - Hygiene - Progress: Goal set today Miscellaneous OT Goals Miscellaneous OT Goal #1: Pt will transfer supine to sit EOB with supervision in preparation for selfcare tasks. OT Goal: Miscellaneous Goal #1 - Progress: Goal set  today  Visit Information  Last OT Received On: 06/04/12 Assistance Needed: +2    Subjective Data  Subjective: What do I do if I have to go to the bathroom? Patient Stated Goal: Pt requesting to get back to  bed.   Prior Functioning  Home Living Lives With: Daughter Available Help at Discharge: Available PRN/intermittently;Family Type of Home: House Home Access: Stairs to enter Secretary/administrator of Steps: 6 Entrance Stairs-Rails: Right Home Layout: One level Bathroom Shower/Tub: Engineer, manufacturing systems: Standard Home Adaptive Equipment: None Prior Function Level of Independence: Independent Able to Take Stairs?: Yes Vocation: Retired Musician: No difficulties Dominant Hand: Right    Cognition  Cognition Overall Cognitive Status: Impaired Area of Impairment: Memory;Safety/judgement Arousal/Alertness: Awake/alert Orientation Level: Disoriented to;Time Behavior During Session: Flat affect Current Attention Level: Focused Attention - Other Comments: occasionally drifts off needing cues for attention Following Commands: Follows one step commands inconsistently Safety/Judgement: Decreased awareness of safety precautions;Decreased safety judgement for tasks assessed;Decreased awareness of need for assistance Awareness of Errors: Assistance required to identify errors made;Assistance required to correct errors made Cognition - Other Comments: slow to process information ?meds (morphine, PCA); drifts off, difficulty problem solving basic tasks and transfers needing mod v/c's to sequence and hand over hand cueing    Mobility  Bed Mobility Bed Mobility: Sit to Supine Sit to Supine: 4: Min assist Details for Bed Mobility Assistance: Pt needed min assistance to lift LE into the bed and to reposition. Transfers Transfers: Sit to Stand Sit to Stand: 1: +2 Total assist Sit to Stand: Patient Percentage: 30% Stand to Sit: 1: +2 Total assist Stand to  Sit: Patient Percentage: 30% Details for Transfer Assistance: Pt needed + 2 assistance for sit to stand from bedside chair.  Unable to take any steps so pivoted around to the bed.    Exercises  General Exercises - Lower Extremity Quad Sets: AAROM;Left;10 reps;Seated   Balance Balance Balance Assessed: Yes Static Standing Balance Static Standing - Balance Support: Right upper extremity supported;Left upper extremity supported Static Standing - Level of Assistance: 1: +2 Total assist;Patient percentage (comment) (30 %) Static Standing - Comment/# of Minutes: facilitation and verbal cues for alignment, tall posture and midline weight distribution for balance; stood x2; attempted pivoting on RLE but pt quickly gave up and impulsively sat back down with little warning   End of Session OT - End of Session Equipment Utilized During Treatment: Gait belt Activity Tolerance: Patient limited by fatigue Patient left: in bed;with call bell/phone within reach;with nursing in room Nurse Communication: Mobility status;Need for lift equipment     Shayanna Thatch OTR/L Pager number [phone] 06/04/2012, 10:58 AM

## 2012-06-04 NOTE — Progress Notes (Addendum)
[facility] [place]  Postoperative Visit - Amputation  Date of Surgery: 06/03/2012 Procedure(s): AMPUTATION BELOW KNEE Left Surgeon: Surgeon(s): [name], MD POD: 1 Day Post-Op  Subjective Leroy [name] is a 67 y.o. male who is S/P Left Procedure(s): AMPUTATION BELOW KNEE.  Pt.denies increased pain in the stump. The patient notes pain is well controlled. Pt. denies phantom pain.  Significant Diagnostic Studies: CBC Lab Results  Component Value Date   WBC 10.1 06/04/2012   HGB 14.9 06/04/2012   HCT 42.2 06/04/2012   MCV 86.7 06/04/2012   PLT 171 06/04/2012    BMET    Component Value Date/Time   NA 134* 06/04/2012 0520   K 4.1 06/04/2012 0520   CL 98 06/04/2012 0520   CO2 25 06/04/2012 0520   GLUCOSE 206* 06/04/2012 0520   BUN 14 06/04/2012 0520   CREATININE 1.08 06/04/2012 0520   CALCIUM 9.4 06/04/2012 0520   GFRNONAA 70* 06/04/2012 0520   GFRAA 81* 06/04/2012 0520    COAG Lab Results  Component Value Date   INR 0.93 06/02/2012   INR 1.6 04/25/2010   INR 2.08* 03/25/2010   No results found for this basename: PTT     Intake/Output Summary (Last 24 hours) at 06/04/12 0758 Last data filed at 06/04/12 0606  Gross per 24 hour  Intake   2490 ml  Output   1350 ml  Net   1140 ml   No data found.    Physical Examination  BP Readings from Last 3 Encounters:  06/04/12 150/50  06/04/12 150/50  06/02/12 88/54   Temp Readings from Last 3 Encounters:  06/04/12 98.9 F (37.2 C) Oral  06/04/12 98.9 F (37.2 C) Oral  06/02/12 98.3 F (36.8 C)    SpO2 Readings from Last 3 Encounters:  06/04/12 93%  06/04/12 93%  06/02/12 95%   Pulse Readings from Last 3 Encounters:  06/04/12 89  06/04/12 89  06/02/12 75    Pt is A&Ox3  WDWN male with no complaints  Left amputation wound is clean and dry dressing.      Assessment/plan:  Leroy [name] is a 67 y.o. male who is s/p Left Procedure(s): AMPUTATION BELOW KNEE  The patient's stump is  clean, dry, intact.  Follow-up 4 weeks from surgery  Clinton Gallant [facility] 7:58 AM 06/04/[phone]  Addendum  I have independently interviewed and examined the patient, and I agree with the physician assistant's findings.  Bandage off tomorrow.  Extensive musculature intraoperatively with good blood flow.  I suspect this will be a viable BKA.  The patient should be a good candidate for CIR.  [name], MD [facility] [place] Office: [phone] Pager: [phone]  06/04/2012, 8:45 AM

## 2012-06-04 NOTE — Progress Notes (Signed)
Physical Therapy Treatment Patient Details Name: Leroy [name] MRN: [id number] DOB: [birth year] Today's Date: 06/04/2012 Time: 8657-8469 PT Time Calculation (min): 8 min  PT Assessment / Plan / Recommendation Comments on Treatment Session  67 y.o male s/p L BAK POD 1. Pt presents trying to get out of the chair unsafely sitting on the edge of the recliner. Required +2total for pt to stand and pivot back to bed. Pt has poor safety awareness.     Follow Up Recommendations  CIR     Does the patient have the potential to tolerate intense rehabilitation     Barriers to Discharge Decreased caregiver support      Equipment Recommendations   (to be determined)    Recommendations for Other Services    Frequency Min 3X/week   Plan Discharge plan remains appropriate;Frequency remains appropriate    Precautions / Restrictions Precautions Precautions: Fall Restrictions Weight Bearing Restrictions: Yes LLE Weight Bearing: Non weight bearing   Pertinent Vitals/Pain No c/o    Mobility  Bed Mobility Bed Mobility: Sit to Supine Sit to Supine: 4: Min assist Details for Bed Mobility Assistance: Pt needed min assistance to lift LE into the bed and to reposition. Transfers Transfers: Sit to Stand;Stand to Sit;Squat Pivot Transfers Sit to Stand: 1: +2 Total assist Sit to Stand: Patient Percentage: 30% Stand to Sit: 1: +2 Total assist Stand to Sit: Patient Percentage: 30% Details for Transfer Assistance: Pt needed + 2 assistance for sit to stand from bedside chair.  Unable to take any steps so pivoted around to the bed. Ambulation/Gait Ambulation/Gait Assistance: Not tested (comment)        PT Diagnosis: Difficulty walking;Abnormality of gait;Generalized weakness;Acute pain;Altered mental status  PT Problem List: Decreased strength;Decreased range of motion;Decreased activity tolerance;Decreased balance;Decreased mobility;Decreased coordination;Decreased cognition;Decreased knowledge of  use of DME;Decreased safety awareness;Decreased knowledge of precautions;Pain;Obesity PT Treatment Interventions: DME instruction;Gait training;Functional mobility training;Therapeutic activities;Therapeutic exercise;Balance training;Neuromuscular re-education;Cognitive remediation;Patient/family education   PT Goals Acute Rehab PT Goals PT Goal Formulation: With patient Time For Goal Achievement: 06/11/12 Potential to Achieve Goals: Good Pt will Roll Supine to Right Side: with supervision PT Goal: Rolling Supine to Right Side - Progress: Goal set today Pt will Roll Supine to Left Side: with supervision PT Goal: Rolling Supine to Left Side - Progress: Goal set today Pt will go Supine/Side to Sit: with supervision PT Goal: Supine/Side to Sit - Progress: Goal set today Pt will go Sit to Supine/Side: with supervision PT Goal: Sit to Supine/Side - Progress: Progressing toward goal Pt will go Sit to Stand: with mod assist PT Goal: Sit to Stand - Progress: Progressing toward goal Pt will go Stand to Sit: with mod assist PT Goal: Stand to Sit - Progress: Progressing toward goal Pt will Transfer Bed to Chair/Chair to Bed: with mod assist PT Transfer Goal: Bed to Chair/Chair to Bed - Progress: Progressing toward goal Pt will Stand: with mod assist;3 - 5 min;with bilateral upper extremity support PT Goal: Stand - Progress: Goal set today Pt will Perform Home Exercise Program: Independently PT Goal: Perform Home Exercise Program - Progress: Goal set today  Visit Information  Last PT Received On: 06/04/12 Assistance Needed: +2    Subjective Data  Subjective: I have to get back to bed. Patient Stated Goal: bed   Cognition  Cognition Overall Cognitive Status: Impaired Area of Impairment: Memory;Safety/judgement Arousal/Alertness: Awake/alert Orientation Level: Disoriented to;Time Behavior During Session: Flat affect Current Attention Level: Focused Attention - Other Comments:  occasionally drifts off  needing cues for attention Following Commands: Follows one step commands inconsistently Safety/Judgement: Decreased awareness of safety precautions;Decreased safety judgement for tasks assessed;Decreased awareness of need for assistance Awareness of Errors: Assistance required to identify errors made;Assistance required to correct errors made Cognition - Other Comments: slow to process information ?meds (morphine, PCA); drifts off, difficulty problem solving basic tasks and transfers needing mod v/c's to sequence and hand over hand cueing    Balance  Balance Balance Assessed: No   End of Session PT - End of Session Equipment Utilized During Treatment: Gait belt Activity Tolerance: Patient limited by fatigue Patient left: in bed;with call bell/phone within reach;with bed alarm set Nurse Communication: Mobility status   GP     [facility] HELEN 06/04/2012, 12:57 PM

## 2012-06-04 NOTE — Progress Notes (Signed)
I await therapy evaluation so that I can discuss rehab venue options pending their assessment. [phone]

## 2012-06-04 NOTE — Progress Notes (Addendum)
I met with patient at bedside and feel he will benefit from an inpt rehab admission. Her daughter will arrive today and I will return to meet with her also. [phone] I met with pt and his daughter. Discussed inpt rehab venue as an option for his rehab. They will think about it but may choose home health. I will follow up tomorrow.

## 2012-06-04 NOTE — Evaluation (Signed)
Physical Therapy Evaluation Patient Details Name: Leroy [name] MRN: [id number] DOB: 12/27/1945 Today's Date: 06/04/2012 Time: 0816-0908 PT Time Calculation (min): 52 min  PT Assessment / Plan / Recommendation Clinical Impression  67 y/o male s/p LLE BKA POD #1. Presents to PT with below impairments affecting pt's safety and independence with mobility. Will benefit physical therapy in the acute setting to maximize strength and functional independence for safe d/c to next venue.     PT Assessment  Patient needs continued PT services    Follow Up Recommendations  CIR    Does the patient have the potential to tolerate intense rehabilitation      Barriers to Discharge Decreased caregiver support      Equipment Recommendations   (defer to next venue)    Recommendations for Other Services     Frequency Min 3X/week    Precautions / Restrictions Precautions Precautions: Fall Restrictions LLE Weight Bearing: Non weight bearing   Pertinent Vitals/Pain  c/o 8/10 leg pain, PCA encouraged      Mobility  Bed Mobility Bed Mobility: Not assessed Details for Bed Mobility Assistance: pt sitting EOB on arrival Transfers Transfers: Sit to Stand;Stand to Sit;Squat Pivot Transfers Sit to Stand: 1: +2 Total assist;From bed;With upper extremity assist Sit to Stand: Patient Percentage: 30% Stand to Sit: 1: +2 Total assist;With upper extremity assist Stand to Sit: Patient Percentage: 30% Squat Pivot Transfers: 1: +2 Total assist (bed->drop arm recliner) Squat Pivot Transfers: Patient Percentage: 30% Details for Transfer Assistance: verbal and faciliatory cues for sequencing and hand placement as well as anterior translation of trunk over BOS and stability as he stood; he stood x2 from the bed, 2nd attempt was better but pt having difficulty problem solving movements; squat pivot bed->recliner with facilitation to keep trunk over foot and pivot hips to  recliner Ambulation/Gait Ambulation/Gait Assistance: Not tested (comment)    Exercises General Exercises - Lower Extremity Quad Sets: AAROM;Left;10 reps;Seated   PT Diagnosis: Difficulty walking;Abnormality of gait;Generalized weakness;Acute pain;Altered mental status  PT Problem List: Decreased strength;Decreased range of motion;Decreased activity tolerance;Decreased balance;Decreased mobility;Decreased coordination;Decreased cognition;Decreased knowledge of use of DME;Decreased safety awareness;Decreased knowledge of precautions;Pain;Obesity PT Treatment Interventions: DME instruction;Gait training;Functional mobility training;Therapeutic activities;Therapeutic exercise;Balance training;Neuromuscular re-education;Cognitive remediation;Patient/family education   PT Goals    Visit Information  Last PT Received On: 06/04/12 Assistance Needed: +2    Subjective Data  Subjective: It must be the end of the month. (when asked what his pain score would be). Patient Stated Goal: to walk with prosthetic   Prior Functioning  Home Living Lives With: Daughter Available Help at Discharge: Available PRN/intermittently;Family Type of Home: House Home Access: Stairs to enter [employer] of Steps: 6 Entrance Stairs-Rails: Right Home Layout: One level Bathroom Shower/Tub: Engineer, manufacturing systems: Standard Home Adaptive Equipment: None Prior Function Level of Independence: Independent Able to Take Stairs?: Yes Vocation: Retired Musician: No difficulties    Copywriter, advertising Overall Cognitive Status: Impaired Area of Impairment: Attention;Following commands;Safety/judgement;Problem solving;Awareness of deficits;Awareness of errors Arousal/Alertness: Awake/alert Orientation Level: Disoriented to;Time Behavior During Session: Flat affect Current Attention Level: Sustained;Focused Attention - Other Comments: occasionally drifts off needing cues for  attention Following Commands: Follows one step commands with increased time Awareness of Errors: Assistance required to identify errors made;Assistance required to correct errors made Cognition - Other Comments: slow to process information ?meds (morphine, PCA); drifts off, difficulty problem solving basic tasks and transfers needing mod v/c's to sequence and hand over hand cueing  Extremity/Trunk Assessment Right Upper Extremity Assessment RUE ROM/Strength/Tone:  (see OT note) Left Upper Extremity Assessment LUE ROM/Strength/Tone:  (see OT note) Right Lower Extremity Assessment RLE ROM/Strength/Tone: Deficits RLE ROM/Strength/Tone Deficits: grossly 4/5 knee flex/ext Left Lower Extremity Assessment LLE ROM/Strength/Tone: Deficits;Unable to fully assess;Due to pain LLE ROM/Strength/Tone Deficits: BKA, pt with grossly 2/5 quad strenth, knee extension grossly 5 degrees from full extension LLE Sensation: WFL - Light Touch Trunk Assessment Trunk Assessment:  (obese)   Balance Balance Balance Assessed: Yes Static Standing Balance Static Standing - Balance Support: Bilateral upper extremity supported Static Standing - Level of Assistance: 1: +2 Total assist (40%) Static Standing - Comment/# of Minutes: facilitation and verbal cues for alignment, tall posture and midline weight distribution for balance; stood x2; attempted pivoting on RLE but pt quickly gave up and impulsively sat back down with little warning  End of Session PT - End of Session Equipment Utilized During Treatment: Gait belt Activity Tolerance: Patient limited by fatigue;Patient tolerated treatment well Patient left: in chair;with call bell/phone within reach Nurse Communication: Mobility status  GP     [facility] 06/04/2012, 10:13 AM

## 2012-06-04 NOTE — Consult Note (Signed)
Physical Medicine and Rehabilitation Consult Reason for Consult: Left BKA Referring Physician: Dr. Imogene Burn    HPI: Leroy [name] is a 67 y.o. right-handed male history of CAD, CABG, diabetes mellitus with peripheral neuropathy. Admitted 06/03/2012 with left gangrenous foot with increased rest pain. No relief with conservative care. 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 Imogene Burn. Postoperative pain management. Placed on subcutaneous Lovenox for DVT prophylaxis. Physical occupational therapy evaluations pending. M.D. is requested physical medicine rehabilitation consult to consider inpatient rehabilitation services   Review of Systems  Respiratory: Positive for shortness of breath.   Cardiovascular: Positive for palpitations and leg swelling.  Neurological: Positive for weakness.  All other systems reviewed and are negative.   Past Medical History  Diagnosis Date  . Hyperlipidemia   . CHF (congestive heart failure)     EF 40% 2010  . Chronic anticoagulation   . Cataract   . Hypotension   . Paroxysmal atrial fibrillation     takes Metoprolol daily  . Coronary artery disease   . Peripheral vascular disease   . History of blood clots     in legs--this was in 2011--has been off of Coumadin 51months;takes Xerelto daily  . Peripheral neuropathy   . Joint pain   . H/O hiatal hernia   . Chronic kidney disease     on Lisinopril to protect kidneys d/t being on Metformin per pt  . Diabetes mellitus     takes Metformni daily  and Lantus 50units bid  . Myocardial infarction     total of 8 heart attacks;last one in 2011   Past Surgical History  Procedure Laterality Date  . Revasculariztion  2011/2010    x 2   . Coronary artery bypass graft  03/2000    quadruple  . Tonsillectomy    . Hernia repair    . Cardiac catheterization  03/19/10  . Abdominal aortagram  04-30-12  . Coronary angioplasty      3 stents  . Adenoidectomy     Family History  Problem Relation Age of  Onset  . Heart disease Mother     before age 53  . Diabetes Mother   . Heart attack Mother   . Heart disease Father    Social History:  reports that he has quit smoking. His smoking use included Cigarettes. He smoked 0.00 packs per day. He has never used smokeless tobacco. He reports that  drinks alcohol. He reports that he does not use illicit drugs. Allergies:  Allergies  Allergen Reactions  . Statins     Muscle aches  . Amlodipine Besylate   . Cephalexin     REACTION: Hives   Medications Prior to Admission  Medication Sig Dispense Refill  . aspirin 325 MG tablet Take 325 mg by mouth daily.      . colesevelam (WELCHOL) 625 MG tablet Take 3 tablets (1,875 mg total) by mouth 2 (two) times daily with a meal.  120 tablet  11  . insulin glargine (LANTUS SOLOSTAR) 100 UNIT/ML injection Inject 50 Units into the skin 2 (two) times daily. Please dispense a 90 day supply with refills to complete the year  10 pen  2  . lisinopril (PRINIVIL,ZESTRIL) 10 MG tablet Take 1 tablet (10 mg total) by mouth daily.  30 tablet  11  . metoprolol succinate (TOPROL-XL) 50 MG 24 hr tablet Take 1 tablet (50 mg total) by mouth daily.  30 tablet  11  . nitroGLYCERIN (NITROSTAT) 0.4 MG SL tablet Place 1  tablet (0.4 mg total) under the tongue every 5 (five) minutes as needed.  25 tablet  11  . Saxagliptin-Metformin 2.08-998 MG TB24 Take 1 tablet by mouth 2 (two) times daily.  120 tablet  0    Home:    Functional History:   Functional Status:  Mobility:          ADL:    Cognition: Cognition Orientation Level: Oriented X4    Blood pressure 150/50, pulse 89, temperature 98.9 F (37.2 C), temperature source Oral, resp. rate 18, height 5\' 9"  (1.753 m), SpO2 93.00%. Physical Exam  Vitals reviewed. Constitutional: He is oriented to person, place, and time.  Morbidly obese  HENT:  Head: Normocephalic and atraumatic.  Eyes: Conjunctivae and EOM are normal.  Neck: Normal range of motion. Neck supple.  No tracheal deviation present. No thyromegaly present.  Cardiovascular: Normal rate and regular rhythm.   Pulmonary/Chest: Effort normal and breath sounds normal. No respiratory distress.  Abdominal: Soft. Bowel sounds are normal. He exhibits no distension.  Obese  Musculoskeletal:  Left leg appropriately tender.  Neurological: He is alert and oriented to person, place, and time.  Follows three step commands. Good sitting balance. UE grossly 4+ to 5/5. RLE 3 to 4/5. Decreased PP right leg  Skin:  BKA site dressed  Psychiatric:  Extremely flat    Results for orders placed during the hospital encounter of 06/03/12 (from the past 24 hour(s))  GLUCOSE, CAPILLARY     Status: Abnormal   Collection Time    06/03/12  9:32 AM      Result Value Range   Glucose-Capillary 218 (*) 70 - 99 mg/dL  GLUCOSE, CAPILLARY     Status: Abnormal   Collection Time    06/03/12 10:09 AM      Result Value Range   Glucose-Capillary 188 (*) 70 - 99 mg/dL  GLUCOSE, CAPILLARY     Status: Abnormal   Collection Time    06/03/12  3:00 PM      Result Value Range   Glucose-Capillary 164 (*) 70 - 99 mg/dL  GLUCOSE, CAPILLARY     Status: Abnormal   Collection Time    06/03/12  4:41 PM      Result Value Range   Glucose-Capillary 175 (*) 70 - 99 mg/dL   Comment 1 Documented in Chart     Comment 2 Notify RN    HEMOGLOBIN A1C     Status: Abnormal   Collection Time    06/03/12  6:13 PM      Result Value Range   Hemoglobin A1C 11.3 (*) <5.7 %   Mean Plasma Glucose 278 (*) <117 mg/dL  GLUCOSE, CAPILLARY     Status: Abnormal   Collection Time    06/03/12  9:24 PM      Result Value Range   Glucose-Capillary 252 (*) 70 - 99 mg/dL   Comment 1 Documented in Chart     Comment 2 Notify RN    BASIC METABOLIC PANEL     Status: Abnormal   Collection Time    06/04/12  5:20 AM      Result Value Range   Sodium 134 (*) 135 - 145 mEq/L   Potassium 4.1  3.5 - 5.1 mEq/L   Chloride 98  96 - 112 mEq/L   CO2 25  19 - 32  mEq/L   Glucose, Bld 206 (*) 70 - 99 mg/dL   BUN 14  6 - 23 mg/dL   Creatinine, Ser 3.08  0.50 - 1.35 mg/dL   Calcium 9.4  8.4 - 40.9 mg/dL   GFR calc non Af Amer 70 (*) >90 mL/min   GFR calc Af Amer 81 (*) >90 mL/min  CBC     Status: None   Collection Time    06/04/12  5:20 AM      Result Value Range   WBC 10.1  4.0 - 10.5 K/uL   RBC 4.87  4.22 - 5.81 MIL/uL   Hemoglobin 14.9  13.0 - 17.0 g/dL   HCT 81.1  91.4 - 78.2 %   MCV 86.7  78.0 - 100.0 fL   MCH 30.6  26.0 - 34.0 pg   MCHC 35.3  30.0 - 36.0 g/dL   RDW 95.6  21.3 - 08.6 %   Platelets 171  150 - 400 K/uL  GLUCOSE, CAPILLARY     Status: Abnormal   Collection Time    06/04/12  5:50 AM      Result Value Range   Glucose-Capillary 183 (*) 70 - 99 mg/dL   Dg Chest 2 View  5/78/4696  *RADIOLOGY REPORT*  Clinical Data: Left below-knee amputation  CHEST - 2 VIEW  Comparison: 03/16/2010  Findings: Lungs are clear.  No focal consolidation. No pleural effusion or pneumothorax.  The heart is top normal in size.  Mild degenerative changes of the visualized thoracolumbar spine.  IMPRESSION: No evidence of acute cardiopulmonary disease.   Original Report Authenticated By: Charline Bills, M.D.     Assessment/Plan: Diagnosis: left BKA 1. Does the need for close, 24 hr/day medical supervision in concert with the patient's rehab needs make it unreasonable for this patient to be served in a less intensive setting? Yes and Potentially 2. Co-Morbidities requiring supervision/potential complications: cad, cm, afib, morbid obesity 3. Due to bladder management, bowel management, safety, skin/wound care, disease management and pain management, does the patient require 24 hr/day rehab nursing? Yes and Potentially 4. Does the patient require coordinated care of a physician, rehab nurse, PT (1-2 hrs/day, 5 days/week) and OT (1-2 hrs/day, 5 days/week) to address physical and functional deficits in the context of the above medical diagnosis(es)?  Yes Addressing deficits in the following areas: balance, endurance, locomotion, strength, transferring, bowel/bladder control, bathing, dressing, feeding, grooming, toileting and psychosocial support 5. Can the patient actively participate in an intensive therapy program of at least 3 hrs of therapy per day at least 5 days per week? Potentially 6. The potential for patient to make measurable gains while on inpatient rehab is good 7. Anticipated functional outcomes upon discharge from inpatient rehab are ?supervision to mod I with PT, ?supervision to mod I with OT, n/a with SLP. 8. Estimated rehab length of stay to reach the above functional goals is: ?1 week/TBD 9. Does the patient have adequate social supports to accommodate these discharge functional goals? Yes and Potentially 10. Anticipated D/C setting: Home 11. Anticipated post D/C treatments: HH therapy 12. Overall Rehab/Functional Prognosis: excellent  RECOMMENDATIONS: This patient's condition is appropriate for continued rehabilitative care in the following setting: CIR? Patient has agreed to participate in recommended program. Potentially Note that insurance prior authorization may be required for reimbursement for recommended care.  Comment: Pt just POD#1. Will follow for rehab progress. May benefit from a brief admit.  Ranelle Oyster, MD, [place] [facility]     06/04/2012

## 2012-06-04 NOTE — Progress Notes (Signed)
[name]  MATTHE SLOANE YNW:[id number] DOB: [birth year] DOA: 06/03/2012 PCP: [name], MD  Brief Narrative: Leroy [name] is a 67 y.o. male with obesity, uncontrolled diabetes, severe peripheral vascular disease was admitted today for elective left BKA. Patient is seen postoperatively in his hospital room. At the current time he denies chest pain or dyspnea but complains of severe stump pain.   Assessment/Plan: 1. Status post left BKA-management per vascular surgery. PT evaluated patient and recommended CIR. Patient agreeable.  2. Diabetes mellitus type 2-uncontrolled-last hemoglobin A1c in the system higher than 14. Patient placed back on his Lantus 50 units twice a day and will check CBGs 4 times a day. 3. Ischemic cardiomyopathy - still somewhat hypotensive this morning, asymptomatic, will restart his medications as clinically indicated.   Code Status: Full Family Communication: none  Disposition Plan: CIR pending evaluation  Consultants:  PM&R  Procedures:  Left BKA  Antibiotics:  none  HPI/Subjective: - no complaint this morning, pain relatively well controlled per patient.   Objective: Filed Vitals:   06/04/12 0816 06/04/12 0830 06/04/12 0852 06/04/12 1312  BP:    103/48  Pulse:  104  98  Temp:    99.4 F (37.4 C)  TempSrc:    Oral  Resp:    18  Height:   6\' 9"  (2.057 m)   Weight:   136.079 kg (300 lb)   SpO2: 94% 95%  98%    Intake/Output Summary (Last 24 hours) at 06/04/12 1317 Last data filed at 06/04/12 1230  Gross per 24 hour  Intake   2970 ml  Output   3150 ml  Net   -180 ml   Filed Weights   06/04/12 0852  Weight: 136.079 kg (300 lb)    Exam:   General:  NAD  Cardiovascular: regular rate and rhythm, without MRG  Respiratory: good air movement, clear to auscultation throughout, no wheezing, ronchi or rales  Abdomen: soft, not tender to palpation, positive bowel sounds  MSK: no peripheral edema, s/p left  BKA  Neuro: CN 2-12 grossly intact, MS 5/5 in all 4  Data Reviewed: Basic Metabolic Panel:  Recent Labs Lab 06/02/12 1127 06/04/12 0520  NA 138 134*  K 4.5 4.1  CL 101 98  CO2 27 25  GLUCOSE 226* 206*  BUN 16 14  CREATININE 1.10 1.08  CALCIUM 9.9 9.4   Liver Function Tests:  Recent Labs Lab 06/02/12 1127  AST 13  ALT 13  ALKPHOS 103  BILITOT 0.2*  PROT 7.2  ALBUMIN 3.0*   CBC:  Recent Labs Lab 06/02/12 1127 06/04/12 0520  WBC 6.5 10.1  HGB 15.5 14.9  HCT 43.3 42.2  MCV 86.1 86.7  PLT 193 171   CBG:  Recent Labs Lab 06/03/12 1500 06/03/12 1641 06/03/12 2124 06/04/12 0550 06/04/12 1119  GLUCAP 164* 175* 252* 183* 235*   Recent Results (from the past 240 hour(s))  SURGICAL PCR SCREEN     Status: Abnormal   Collection Time    06/02/12 11:28 AM      Result Value Range Status   MRSA, PCR NEGATIVE  NEGATIVE Final   Staphylococcus aureus POSITIVE (*) NEGATIVE Final   Comment:            The Xpert SA Assay (FDA     approved for NASAL specimens     in patients over 31 years of age),     is one component of     a comprehensive surveillance  program.  Test performance has     been validated by [facility] for patients greater     than or equal to 19 year old.     It is not intended     to diagnose infection nor to     guide or monitor treatment.     Scheduled Meds: . aspirin  325 mg Oral Daily  . colesevelam  1,875 mg Oral BID WC  . docusate sodium  100 mg Oral Daily  . enoxaparin (LOVENOX) injection  40 mg Subcutaneous Q24H  . insulin aspart  0-15 Units Subcutaneous TID WC  . insulin aspart  0-5 Units Subcutaneous QHS  . insulin glargine  50 Units Subcutaneous BID  . linagliptin  5 mg Oral Q breakfast  . metFORMIN  1,000 mg Oral BID WC  . metoprolol succinate  50 mg Oral Daily  . morphine   Intravenous Q4H  . pantoprazole  40 mg Oral Daily   Continuous Infusions:   Active Problems:   CAD   CARDIOMYOPATHY   ATRIAL  FIBRILLATION, PAROXYSMAL   Type II or unspecified type diabetes mellitus with neurological manifestations, uncontrolled(250.62)   Atherosclerosis of native arteries of the extremities with gangrene   Critical lower limb ischemia   [name], MD [facility] Pager [phone]. If 7 PM - 7 AM, please contact night-coverage at [email or web address], password [facility] 06/04/2012, 1:17 PM  LOS: 1 day

## 2012-06-04 NOTE — Care Management Note (Signed)
    Page 1 of 1   06/11/2012     3:20:30 PM   CARE MANAGEMENT NOTE 06/11/2012  Patient:  Leroy [name], Leroy [name]   Account Number:  [id number]  Date Initiated:  06/04/2012  Documentation initiated by:  AMERSON,JULIE  Subjective/Objective Assessment:   PT S/P LT BKA ON 06/03/12.  PTA, PT LIVES AT HOME WITH WIFE.     Action/Plan:   CIR FOLLOWING FOR POSSIBLE ADMISSION.  WILL CONT TO FOLLOW/ASSIST WITH DC PLANNING.   Anticipated DC Date:  06/11/2012   Anticipated DC Plan:  SKILLED NURSING FACILITY  In-house referral  Clinical Social Worker      DC Planning Services  CM consult      Choice offered to / List presented to:             Status of service:  Completed, signed off Medicare Important Message given?   (If response is "NO", the following Medicare IM given date fields will be blank) Date Medicare IM given:   Date Additional Medicare IM given:    Discharge Disposition:  SKILLED NURSING FACILITY  Per UR Regulation:  Reviewed for med. necessity/level of care/duration of stay  If discussed at Long Length of Stay Meetings, dates discussed:   06/09/2012  06/11/2012    Comments:  06/11/12 JULIE AMERSON,RN,BSN [phone] PT FOR DC TO SNF TODAY, PER CSW ARRANGEMENTS.  06/05/12 JULIE AMERSON,RN,BSN [phone] APPEARS PT'S INSURANCE WILL NOT APPROVE CIR FOR REHAB. WILL CONSULT CSW TO FACILITATE POSSIBLE DC TO SNF WHEN MEDICALLY STABLE.

## 2012-06-04 NOTE — Progress Notes (Signed)
UR COMPLETED  

## 2012-06-04 NOTE — Evaluation (Signed)
Occupational Therapy Evaluation Patient Details Name: Leroy [name] MRN: [id number] DOB: 15-Dec-1945 Today's Date: 06/04/2012 Time: 0817-0909 OT Time Calculation (min): 52 min  OT Assessment / Plan / Recommendation Clinical Impression  Pt is 67 yr old male admitted for LBKA.  Pt now with some confusion and decreased initiation as well as decreased independence with all selfcare and mobility.  He currently needs total assist +2 for all transfers and sit to stand as well as LB selfcare.  Feel he will benefit from acute care OT to address these deficits.  Will benefit from CIR consult but feel he will need 24 hour supervision still after inpatient stay.    OT Assessment  Patient needs continued OT Services    Follow Up Recommendations  CIR    Barriers to Discharge Decreased caregiver support    Equipment Recommendations  3 in 1 bedside comode;Tub/shower bench    Recommendations for Other Services Rehab consult  Frequency  Min 2X/week    Precautions / Restrictions Precautions Precautions: Fall Restrictions Weight Bearing Restrictions: Yes LLE Weight Bearing: Non weight bearing   Pertinent Vitals/Pain HR increased to 120 with activity, O2 sats 93-95% during session Pain rated at 2 on the faces scale    ADL  Eating/Feeding: Performed;Set up Where Assessed - Eating/Feeding: Edge of bed Grooming: Simulated;Set up Where Assessed - Grooming: Unsupported sitting Upper Body Bathing: Simulated;Set up Where Assessed - Upper Body Bathing: Unsupported sitting Lower Body Bathing: Simulated;+2 Total assistance Lower Body Bathing: Patient Percentage: 30% Where Assessed - Lower Body Bathing: Supported sit to stand Upper Body Dressing: Simulated;Set up Where Assessed - Upper Body Dressing: Unsupported sitting Lower Body Dressing: Simulated;+2 Total assistance Lower Body Dressing: Patient Percentage: 30% Where Assessed - Lower Body Dressing: Supported sit to stand Toilet Transfer:  Simulated;+2 Total assistance Toilet Transfer: Patient Percentage: 30% Statistician Method: Ambulance person: Other (comment) (simulated to bedside chair) Toileting - Clothing Manipulation and Hygiene: Simulated;+2 Total assistance Toileting - Clothing Manipulation and Hygiene: Patient Percentage: 30% Where Assessed - Toileting Clothing Manipulation and Hygiene: Sit to stand from 3-in-1 or toilet Tub/Shower Transfer Method: Not assessed Equipment Used: Rolling walker Transfers/Ambulation Related to ADLs: Pt able to perform sit to stand with total assist +2 (pt 30%).  Unable to take steps this am.  Total assist = 2 for squat pivot transfer to the chair as well. ADL Comments: Pt with decreased initiation throughout session and pt not aware of it.  Demonstrates decreased ability to reach his RLE for dressing tasks sitting EOB as well.    OT Diagnosis: Generalized weakness;Cognitive deficits  OT Problem List: Decreased strength;Decreased activity tolerance;Decreased cognition;Decreased safety awareness;Decreased knowledge of use of DME or AE;Pain;Obesity OT Treatment Interventions: Self-care/ADL training;DME and/or AE instruction;Therapeutic activities;Balance training;Patient/family education;Cognitive remediation/compensation   OT Goals Acute Rehab OT Goals OT Goal Formulation: With patient ADL Goals Pt Will Perform Lower Body Bathing: with mod assist;Sit to stand from bed;with adaptive equipment ADL Goal: Lower Body Bathing - Progress: Goal set today Pt Will Perform Lower Body Dressing: with mod assist;Sit to stand from bed;with adaptive equipment ADL Goal: Lower Body Dressing - Progress: Goal set today Pt Will Transfer to Toilet: with mod assist;3-in-1;Squat pivot transfer ADL Goal: Toilet Transfer - Progress: Goal set today Pt Will Perform Toileting - Clothing Manipulation: with mod assist;Sitting on 3-in-1 or toilet;Standing ADL Goal: Toileting - Clothing  Manipulation - Progress: Goal set today Pt Will Perform Toileting - Hygiene: with mod assist;Sit to stand from 3-in-1/toilet ADL Goal:  Toileting - Hygiene - Progress: Goal set today Miscellaneous OT Goals Miscellaneous OT Goal #1: Pt will transfer supine to sit EOB with supervision in preparation for selfcare tasks. OT Goal: Miscellaneous Goal #1 - Progress: Goal set today  Visit Information  Last OT Received On: 06/04/12 Assistance Needed: +2    Subjective Data  Subjective: I'm not confused, I'm just taking my time. Patient Stated Goal: To be able to walk again   Prior Functioning     Home Living Lives With: Daughter Available Help at Discharge: Available PRN/intermittently;Family Type of Home: House Home Access: Stairs to enter Secretary/administrator of Steps: 6 Entrance Stairs-Rails: Right Home Layout: One level Bathroom Shower/Tub: Engineer, manufacturing systems: Standard Home Adaptive Equipment: None Prior Function Level of Independence: Independent Able to Take Stairs?: Yes Vocation: Retired Musician: No difficulties Dominant Hand: Right         Vision/Perception Vision - History Baseline Vision: No visual deficits Patient Visual Report: No change from baseline Vision - Assessment Vision Assessment: Vision not tested Perception Perception: Within Functional Limits Praxis Praxis: Intact   Cognition  Cognition Overall Cognitive Status: Impaired Area of Impairment: Attention;Following commands;Safety/judgement;Problem solving;Awareness of deficits;Awareness of errors Arousal/Alertness: Awake/alert Orientation Level: Disoriented to;Time Behavior During Session: Flat affect Current Attention Level: Sustained;Focused Attention - Other Comments: occasionally drifts off needing cues for attention Following Commands: Follows one step commands with increased time Awareness of Errors: Assistance required to identify errors made;Assistance  required to correct errors made Cognition - Other Comments: slow to process information ?meds (morphine, PCA); drifts off, difficulty problem solving basic tasks and transfers needing mod v/c's to sequence and hand over hand cueing    Extremity/Trunk Assessment Right Upper Extremity Assessment RUE ROM/Strength/Tone: Within functional levels RUE Sensation: WFL - Light Touch RUE Coordination: Deficits RUE Coordination Deficits: Pt with increased difficulty attempting to tie his gown. Left Upper Extremity Assessment LUE ROM/Strength/Tone: Within functional levels;Due to precautions LUE Sensation: WFL - Light Touch LUE Coordination: Deficits LUE Coordination Deficits: Pt with increased difficulty attempting to tie his gown. Right Lower Extremity Assessment RLE ROM/Strength/Tone: Deficits RLE ROM/Strength/Tone Deficits: grossly 4/5 knee flex/ext Left Lower Extremity Assessment LLE ROM/Strength/Tone: Deficits;Unable to fully assess;Due to pain LLE ROM/Strength/Tone Deficits: BKA, pt with grossly 2/5 quad strenth, knee extension grossly 5 degrees from full extension LLE Sensation: WFL - Light Touch Trunk Assessment Trunk Assessment: Normal     Mobility Bed Mobility Bed Mobility: Not assessed Details for Bed Mobility Assistance: pt sitting EOB on arrival Transfers Sit to Stand: 1: +2 Total assist;From bed;With upper extremity assist Sit to Stand: Patient Percentage: 30% Stand to Sit: 1: +2 Total assist;With upper extremity assist Stand to Sit: Patient Percentage: 30% Details for Transfer Assistance: verbal and faciliatory cues for sequencing and hand placement as well as anterior translation of trunk over BOS and stability as he stood; he stood x2 from the bed, 2nd attempt was better but pt having difficulty problem solving movements; squat pivot bed->recliner with facilitation to keep trunk over foot and pivot hips to recliner     Exercise General Exercises - Lower Extremity Quad Sets:  AAROM;Left;10 reps;Seated   Balance Balance Balance Assessed: Yes Static Standing Balance Static Standing - Balance Support: Bilateral upper extremity supported Static Standing - Level of Assistance: 1: +2 Total assist (40%) Static Standing - Comment/# of Minutes: facilitation and verbal cues for alignment, tall posture and midline weight distribution for balance; stood x2; attempted pivoting on RLE but pt quickly gave up and impulsively sat  back down with little warning   End of Session OT - End of Session Equipment Utilized During Treatment: Gait belt Activity Tolerance: Patient limited by fatigue Patient left: in chair;with call bell/phone within reach Nurse Communication: Mobility status;Need for lift equipment      Luz Burcher OTR/L Pager number [phone] 06/04/2012, 10:46 AM

## 2012-06-05 LAB — GLUCOSE, CAPILLARY: Glucose-Capillary: 202 mg/dL — ABNORMAL HIGH (ref 70–99)

## 2012-06-05 LAB — CBC
HCT: 36.3 % — ABNORMAL LOW (ref 39.0–52.0)
MCH: 30.2 pg (ref 26.0–34.0)
MCHC: 35 g/dL (ref 30.0–36.0)
MCV: 86.2 fL (ref 78.0–100.0)
RDW: 13.3 % (ref 11.5–15.5)
WBC: 12.4 10*3/uL — ABNORMAL HIGH (ref 4.0–10.5)

## 2012-06-05 LAB — BASIC METABOLIC PANEL
BUN: 13 mg/dL (ref 6–23)
CO2: 26 mEq/L (ref 19–32)
Calcium: 8.8 mg/dL (ref 8.4–10.5)
Chloride: 100 mEq/L (ref 96–112)
Creatinine, Ser: 1.06 mg/dL (ref 0.50–1.35)
Glucose, Bld: 147 mg/dL — ABNORMAL HIGH (ref 70–99)

## 2012-06-05 MED ORDER — MORPHINE SULFATE 2 MG/ML IJ SOLN
2.0000 mg | INTRAMUSCULAR | Status: DC | PRN
  Administered 2012-06-05 – 2012-06-10 (×4): 2 mg via INTRAVENOUS
  Filled 2012-06-05 (×4): qty 1

## 2012-06-05 NOTE — Progress Notes (Signed)
Patient has [employer] which will not approve an inpt rehab admission at this time. I have informed pt and have made a call to his daughter, Lelon Mast, to inform her. I have recommended to pt to receive a few weeks of rehab in a SNF before returning home. He wishes to speak to his daughter. I will inform RN CM. [phone]

## 2012-06-05 NOTE — Progress Notes (Signed)
While patient is sleeping he has short periods of sleep apnea/. sats remains 98-99 % on 2 l/m n.c. He has used very little morphine. Alert and oriented when awaken

## 2012-06-05 NOTE — Progress Notes (Addendum)
[name]  ANNA LIVERS WUJ:[id number] DOB: [birth year] DOA: 06/03/2012 PCP: [name], MD  Brief Narrative: Leroy [name] is a 67 y.o. male with obesity, uncontrolled diabetes, severe peripheral vascular disease was admitted today for elective left BKA. Patient is seen postoperatively in his hospital room. At the current time he denies chest pain or dyspnea but complains of severe stump pain.   Assessment/Plan: 1. Status post left BKA-management per vascular surgery. PT evaluated patient and recommended CIR however there seems to be some problems with insurance. SNF with rehab likely.  2. Diabetes mellitus type 2-uncontrolled-last hemoglobin A1c in the system higher than 14. Patient placed back on his Lantus 50 units twice a day and will check CBGs 4 times a day. Better today. Patient does endorse medication non-compliance and food indiscretions at home. He reports that his sugars "run wild".  3. Ischemic cardiomyopathy - stable, will continue to monitor 4. Mild leukocytosis - likely reactive s/p surgery, will continue to monitor.   Code Status: Full Family Communication: none  Disposition Plan: SNF when available.   Consultants:  PM&R  Procedures:  Left BKA  Antibiotics:  none  HPI/Subjective: - no complaints this morning  Objective: Filed Vitals:   06/05/12 0000 06/05/12 0004 06/05/12 0401 06/05/12 0455  BP:    131/63  Pulse:    97  Temp:    99.2 F (37.3 C)  TempSrc:    Oral  Resp: 24 11 20 18   Height:      Weight:    131.271 kg (289 lb 6.4 oz)  SpO2: 99% 100% 99% 98%    Intake/Output Summary (Last 24 hours) at 06/05/12 1011 Last data filed at 06/05/12 0730  Gross per 24 hour  Intake  724.5 ml  Output    675 ml  Net   49.5 ml   Filed Weights   06/04/12 0852 06/05/12 0455  Weight: 136.079 kg (300 lb) 131.271 kg (289 lb 6.4 oz)    Exam:   General:  NAD  Cardiovascular: regular rate and rhythm, without MRG  Respiratory:  good air movement, clear to auscultation throughout, no wheezing, ronchi or rales  Abdomen: soft, not tender to palpation, positive bowel sounds  MSK: no peripheral edema, s/p left BKA  Neuro: CN 2-12 grossly intact, MS 5/5 in all 4  Data Reviewed: Basic Metabolic Panel:  Recent Labs Lab 06/02/12 1127 06/04/12 0520 06/05/12 0650  NA 138 134* 135  K 4.5 4.1 3.6  CL 101 98 100  CO2 27 25 26   GLUCOSE 226* 206* 147*  BUN 16 14 13   CREATININE 1.10 1.08 1.06  CALCIUM 9.9 9.4 8.8   Liver Function Tests:  Recent Labs Lab 06/02/12 1127  AST 13  ALT 13  ALKPHOS 103  BILITOT 0.2*  PROT 7.2  ALBUMIN 3.0*   CBC:  Recent Labs Lab 06/02/12 1127 06/04/12 0520 06/05/12 0650  WBC 6.5 10.1 12.4*  HGB 15.5 14.9 12.7*  HCT 43.3 42.2 36.3*  MCV 86.1 86.7 86.2  PLT 193 171 171   CBG:  Recent Labs Lab 06/04/12 0550 06/04/12 1119 06/04/12 1659 06/04/12 2135 06/05/12 0559  GLUCAP 183* 235* 138* 146* 135*   Recent Results (from the past 240 hour(s))  SURGICAL PCR SCREEN     Status: Abnormal   Collection Time    06/02/12 11:28 AM      Result Value Range Status   MRSA, PCR NEGATIVE  NEGATIVE Final   Staphylococcus aureus POSITIVE (*) NEGATIVE Final  Comment:            The Xpert SA Assay (FDA     approved for NASAL specimens     in patients over 11 years of age),     is one component of     a comprehensive surveillance     program.  Test performance has     been validated by [employer] for patients greater     than or equal to 18 year old.     It is not intended     to diagnose infection nor to     guide or monitor treatment.     Scheduled Meds: . aspirin  325 mg Oral Daily  . colesevelam  1,875 mg Oral BID WC  . docusate sodium  100 mg Oral Daily  . enoxaparin (LOVENOX) injection  40 mg Subcutaneous Q24H  . insulin aspart  0-15 Units Subcutaneous TID WC  . insulin aspart  0-5 Units Subcutaneous QHS  . insulin glargine  50 Units Subcutaneous BID  .  linagliptin  5 mg Oral Q breakfast  . metFORMIN  1,000 mg Oral BID WC  . metoprolol succinate  50 mg Oral Daily  . 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 If 7 PM - 7 AM, please contact night-coverage at [email or web address], password [facility] 06/05/2012, 10:11 AM  LOS: 2 days

## 2012-06-05 NOTE — Progress Notes (Signed)
Orthopedic Tech Progress Note Patient Details:  [name] Dec 17, 1945 [id number] Brace order completed by [employer] vendor [place]. Patient ID: Leroy [name], male   DOB: [birth year], 67 y.o.   MRN: [id number]   [name] 06/05/2012, 5:09 PM

## 2012-06-05 NOTE — Progress Notes (Signed)
[facility]   Daily Progress Note  Assessment/Planning: POD #2 s/p L AKA   Warm viable stump with intact staple line  Re-wrap AKA with Kerlix and ACE.  Transition of stump sock this weekend.  I think this patient would be better served with CIR and he would return to ambulation with prosthetic faster with inpatient rehab.  I have discussed this with the patient  Unfortunately, the lipid profile (TC 263, LDL 192) and HgA1c 14.8 reflect the significant co-morbidities that will interfere with any intervention in this patient.  Once the patient heals his L AKA, the RLE severe PAD will need to be addressed, likely with an orbital atherectomy and angioplasty.  Subjective  - 2 Days Post-Op  C/o pain  Objective Filed Vitals:   06/05/12 0000 06/05/12 0004 06/05/12 0401 06/05/12 0455  BP:    131/63  Pulse:    97  Temp:    99.2 F (37.3 C)  TempSrc:    Oral  Resp: 24 11 20 18   Height:      Weight:    289 lb 6.4 oz (131.271 kg)  SpO2: 99% 100% 99% 98%    Intake/Output Summary (Last 24 hours) at 06/05/12 0631 Last data filed at 06/05/12 0458  Gross per 24 hour  Intake  724.5 ml  Output   2025 ml  Net -1300.5 ml    PULM  CTAB CV  RRR GI  soft, NTND VASC  L AKA stump warm and viable.  Blood on dressing, swelling improved  Laboratory CBC    Component Value Date/Time   WBC 10.1 06/04/2012 0520   HGB 14.9 06/04/2012 0520   HCT 42.2 06/04/2012 0520   PLT 171 06/04/2012 0520    BMET    Component Value Date/Time   NA 134* 06/04/2012 0520   K 4.1 06/04/2012 0520   CL 98 06/04/2012 0520   CO2 25 06/04/2012 0520   GLUCOSE 206* 06/04/2012 0520   BUN 14 06/04/2012 0520   CREATININE 1.08 06/04/2012 0520   CALCIUM 9.4 06/04/2012 0520   GFRNONAA 70* 06/04/2012 0520   GFRAA 81* 06/04/2012 0520    [name], MD [facility] [place] Office: [phone] Pager: [phone]  06/05/2012, 6:31 AM

## 2012-06-05 NOTE — Progress Notes (Signed)
Orthopedic Tech Progress Note Patient Details:  Leroy [name] January 22, 1946 [id number]  Patient ID: Leroy [name], male   DOB: 1945-08-17, 67 y.o.   MRN: [id number]   Leroy [name] 06/05/2012, 8:19 AM  CALLED [facility] FOR LEFT BKA SOCK.

## 2012-06-05 NOTE — Clinical Social Work Psychosocial (Signed)
Clinical Social Work Department BRIEF PSYCHOSOCIAL ASSESSMENT 06/05/2012  Patient:  Leroy [name], Leroy [name]     Account Number:  [id number]     Admit date:  06/03/2012  Clinical Social Worker:  Thomasene Mohair  Date/Time:  06/05/2012 12:00 M  Referred by:  Care Management  Date Referred:  06/05/2012 Referred for  SNF Placement   Other Referral:   Interview type:  Patient Other interview type:   daughter/ [name]    PSYCHOSOCIAL DATA Living Status:  FAMILY Admitted from facility:   Level of care:   Primary support name:  [name]/ [phone] Primary support relationship to patient:  CHILD, ADULT Degree of support available:   strong support    CURRENT CONCERNS Current Concerns  Post-Acute Placement  Adjustment to Illness   Other Concerns:    SOCIAL WORK ASSESSMENT / PLAN CSW was referred to Pt to assist with dc planning. Pt lives with daughter Lelon Mast.  He reports that he enjoys going to his friends auto shop and working on cars. Pt shared that he has been out of prison for 2 years after serving a 15 year sentence for a non-violent felony. CSW discussed impact of charge on SNF offers, Pt seemed to understand. Pt asked that CSW follow up with his daughter to coordinate dc plans. CSW spoke to Pt's daughter on the phone and she will look at SNFs over the weekend and plan to have a decision on Monday. CSW advised Pt's daughter that Pt will likely dc on Monday. CSW will continue to follow up to facilitate dc to snf once Pt is medically cleared.   Assessment/plan status:  Psychosocial Support/Ongoing Assessment of Needs Other assessment/ plan:   CSW contacted [facility] liaison and notified her of potential placement on Monday. CSW faxed clinical information to liaison to begin authorization process.   Information/referral to community resources:   SNF list    PATIENT'S/FAMILY'S RESPONSE TO PLAN OF CARE: Pt and family are agreeable to short-term SNf placement.   Frederico Hamman,  LCSW [phone]

## 2012-06-05 NOTE — Progress Notes (Signed)
Physical Therapy Treatment Patient Details Name: Leroy [name] MRN: [id number] DOB: Apr 26, 1945 Today's Date: 06/05/2012 Time: 0865-7846 PT Time Calculation (min): 24 min  PT Assessment / Plan / Recommendation Comments on Treatment Session  Patient s/p Lt. BKA.  Improvements noted with mobility and transfers.  Continues to require cueing for safety.    Follow Up Recommendations  SNF     Does the patient have the potential to tolerate intense rehabilitation     Barriers to Discharge        Equipment Recommendations  Rolling walker with 5" wheels    Recommendations for Other Services    Frequency Min 3X/week   Plan Discharge plan needs to be updated;Frequency remains appropriate    Precautions / Restrictions Precautions Precautions: Fall Restrictions Weight Bearing Restrictions: Yes LLE Weight Bearing: Non weight bearing   Pertinent Vitals/Pain Pain limiting mobility.    Mobility  Bed Mobility Bed Mobility: Supine to Sit;Sitting - Scoot to Edge of Bed Supine to Sit: 4: Min guard;With rails;HOB elevated Sitting - Scoot to Edge of Bed: 4: Min guard;With rail Details for Bed Mobility Assistance: Verbal cues for technique.  Assist for safety.   Transfers Transfers: Sit to Stand;Stand to Sit;Stand Pivot Transfers Sit to Stand: 1: +2 Total assist;With upper extremity assist;From bed Sit to Stand: Patient Percentage: 70% Stand to Sit: 1: +2 Total assist;Without upper extremity assist;With armrests;To chair/3-in-1 Stand to Sit: Patient Percentage: 70% Stand Pivot Transfers: 1: +2 Total assist Stand Pivot Transfers: Patient Percentage: 70% Details for Transfer Assistance: Verbal and tactile cues for technique, hand placement, and safety.   Required step-by-step instructions for pivoting to chair.  Was able to bear weight through UE's to step with RLE. Ambulation/Gait Ambulation/Gait Assistance: Not tested (comment)      PT Goals Acute Rehab PT Goals PT Goal: Supine/Side  to Sit - Progress: Progressing toward goal PT Goal: Sit to Stand - Progress: Progressing toward goal PT Goal: Stand to Sit - Progress: Progressing toward goal PT Transfer Goal: Bed to Chair/Chair to Bed - Progress: Progressing toward goal  Visit Information  Last PT Received On: 06/05/12 Assistance Needed: +2    Subjective Data  Subjective: "Sure we can do that"  When asked about getting OOB to the recliner.   Cognition  Cognition Overall Cognitive Status: Impaired Area of Impairment: Memory;Safety/judgement Arousal/Alertness: Awake/alert Orientation Level: Disoriented to;Time Behavior During Session: Flat affect Current Attention Level: Sustained Attention - Other Comments: Needs redirection to task Memory Deficits: Decreased recall of safe mobility techniques from prior PT visit Safety/Judgement: Decreased awareness of safety precautions;Decreased safety judgement for tasks assessed;Decreased awareness of need for assistance Safety/Judgement - Other Comments: "I can just get back to bed when I'm ready, right"  Patient required repeated instruction to call for assistance when ready to return to bed.    Balance  Balance Balance Assessed: Yes Static Sitting Balance Static Sitting - Balance Support: Right upper extremity supported;Feet unsupported Static Sitting - Level of Assistance: 5: Stand by assistance Static Sitting - Comment/# of Minutes: 6 Static Standing Balance Static Standing - Balance Support: Bilateral upper extremity supported Static Standing - Level of Assistance: 1: +2 Total assist (Patient = 70%) Static Standing - Comment/# of Minutes: Patient leaning slightly to right.  Verbal cues to stand upright.    End of Session PT - End of Session Equipment Utilized During Treatment: Gait belt Activity Tolerance: Patient limited by pain;Patient limited by fatigue Patient left: in chair;with call bell/phone within reach Nurse Communication: Mobility status;Need  for lift  equipment (If transfer - go toward right side.  May need to use lift.)   GP     Vena Austria 06/05/2012, 3:28 PM Durenda Hurt. Renaldo Fiddler, [facility] Acute Rehab Services Pager [phone]

## 2012-06-06 LAB — CBC
HCT: 35.8 % — ABNORMAL LOW (ref 39.0–52.0)
Hemoglobin: 12.3 g/dL — ABNORMAL LOW (ref 13.0–17.0)
RBC: 4.09 MIL/uL — ABNORMAL LOW (ref 4.22–5.81)
WBC: 11.8 10*3/uL — ABNORMAL HIGH (ref 4.0–10.5)

## 2012-06-06 LAB — GLUCOSE, CAPILLARY
Glucose-Capillary: 127 mg/dL — ABNORMAL HIGH (ref 70–99)
Glucose-Capillary: 136 mg/dL — ABNORMAL HIGH (ref 70–99)
Glucose-Capillary: 132 mg/dL — ABNORMAL HIGH (ref 70–99)

## 2012-06-06 NOTE — Progress Notes (Signed)
[name]  GAIL CREEKMORE ZOX:[id number] DOB: [birth year] DOA: 06/03/2012 PCP: [name], MD  Brief Narrative: Leroy [name] is a 67 y.o. male with obesity, uncontrolled diabetes, severe peripheral vascular disease was admitted today for elective left BKA. Patient is seen postoperatively in his hospital room. At the current time he denies chest pain or dyspnea but complains of severe stump pain.   Assessment/Plan: 1. Status post left BKA-management per vascular surgery. PT evaluated patient and recommended CIR however there seems to be some problems with insurance. SNF with rehab likely. Stable today.  2. Diabetes mellitus type 2-uncontrolled-last hemoglobin A1c in the system higher than 14. Patient placed back on his Lantus 50 units twice a day and will check CBGs 4 times a day. Better today. Patient does endorse medication non-compliance and food indiscretions at home. He reports that his sugars "run wild".  3. Ischemic cardiomyopathy - stable, will continue to monitor 4. Mild leukocytosis - likely reactive s/p surgery, will continue to monitor. Better today. Mild fever last night, resolved this morning.   Code Status: Full Family Communication: none  Disposition Plan: SNF when available.   Consultants:  PM&R  Procedures:  Left BKA  Antibiotics:  none  HPI/Subjective: - no complaints this morning  Objective: Filed Vitals:   06/05/12 1310 06/05/12 2019 06/06/12 0436 06/06/12 1322  BP: 124/83 158/62 139/61 123/30  Pulse: 104 106 89 89  Temp: 99 F (37.2 C) 100.6 F (38.1 C) 98.8 F (37.1 C) 98 F (36.7 C)  TempSrc: Oral Oral Axillary Oral  Resp: 18 20 20 18   Height:      Weight:   131.18 kg (289 lb 3.2 oz)   SpO2: 92% 100% 100% 100%    Intake/Output Summary (Last 24 hours) at 06/06/12 1412 Last data filed at 06/06/12 1325  Gross per 24 hour  Intake   1320 ml  Output    650 ml  Net    670 ml   Filed Weights   06/04/12 0852 06/05/12  0455 06/06/12 0436  Weight: 136.079 kg (300 lb) 131.271 kg (289 lb 6.4 oz) 131.18 kg (289 lb 3.2 oz)    Exam:   General:  NAD  Cardiovascular: regular rate and rhythm, without MRG  Respiratory: good air movement, clear to auscultation throughout, no wheezing, ronchi or rales  Abdomen: soft, not tender to palpation, positive bowel sounds  MSK: no peripheral edema, s/p left BKA  Neuro: CN 2-12 grossly intact, MS 5/5 in all 4  Exam up to date as of 3/1  Data Reviewed: Basic Metabolic Panel:  Recent Labs Lab 06/02/12 1127 06/04/12 0520 06/05/12 0650  NA 138 134* 135  K 4.5 4.1 3.6  CL 101 98 100  CO2 27 25 26   GLUCOSE 226* 206* 147*  BUN 16 14 13   CREATININE 1.10 1.08 1.06  CALCIUM 9.9 9.4 8.8   Liver Function Tests:  Recent Labs Lab 06/02/12 1127  AST 13  ALT 13  ALKPHOS 103  BILITOT 0.2*  PROT 7.2  ALBUMIN 3.0*   CBC:  Recent Labs Lab 06/02/12 1127 06/04/12 0520 06/05/12 0650 06/06/12 0440  WBC 6.5 10.1 12.4* 11.8*  HGB 15.5 14.9 12.7* 12.3*  HCT 43.3 42.2 36.3* 35.8*  MCV 86.1 86.7 86.2 87.5  PLT 193 171 171 168   CBG:  Recent Labs Lab 06/05/12 1641 06/05/12 2124 06/06/12 0628 06/06/12 0657 06/06/12 1115  GLUCAP 176* 162* 127* 136* 94   Recent Results (from the past 240 hour(s))  SURGICAL PCR SCREEN     Status: Abnormal   Collection Time    06/02/12 11:28 AM      Result Value Range Status   MRSA, PCR NEGATIVE  NEGATIVE Final   Staphylococcus aureus POSITIVE (*) NEGATIVE Final   Comment:            The Xpert SA Assay (FDA     approved for NASAL specimens     in patients over 67 years of age),     is one component of     a comprehensive surveillance     program.  Test performance has     been validated by [employer] for patients greater     than or equal to 80 year old.     It is not intended     to diagnose infection nor to     guide or monitor treatment.     Scheduled Meds: . aspirin  325 mg Oral Daily  .  colesevelam  1,875 mg Oral BID WC  . docusate sodium  100 mg Oral Daily  . enoxaparin (LOVENOX) injection  40 mg Subcutaneous Q24H  . insulin aspart  0-15 Units Subcutaneous TID WC  . insulin aspart  0-5 Units Subcutaneous QHS  . insulin glargine  50 Units Subcutaneous BID  . linagliptin  5 mg Oral Q breakfast  . metFORMIN  1,000 mg Oral BID WC  . metoprolol succinate  50 mg Oral Daily  . pantoprazole  40 mg Oral Daily   Continuous Infusions:   Active Problems:   CAD   CARDIOMYOPATHY   ATRIAL FIBRILLATION, PAROXYSMAL   Type II or unspecified type diabetes mellitus with neurological manifestations, uncontrolled(250.62)   Atherosclerosis of native arteries of the extremities with gangrene   Critical lower limb ischemia   [name], MD [facility] Pager [phone]. If 7 PM - 7 AM, please contact night-coverage at [email or web address], password [facility] 06/06/2012, 2:12 PM  LOS: 3 days

## 2012-06-06 NOTE — Progress Notes (Addendum)
[facility] [place]  Postoperative Visit - Amputation  Date of Surgery: 06/03/2012 Procedure(s): AMPUTATION BELOW KNEE Left Surgeon: Surgeon(s): [name], MD POD: 3 Days Post-Op  Subjective Leroy [name] is a 67 y.o. male who is S/P Left Procedure(s): AMPUTATION BELOW KNEE.  Pt.denies increased pain in the stump. The patient notes pain is well controlled. Pt. reports phantom pain.  Significant Diagnostic Studies: CBC Lab Results  Component Value Date   WBC 11.8* 06/06/2012   HGB 12.3* 06/06/2012   HCT 35.8* 06/06/2012   MCV 87.5 06/06/2012   PLT 168 06/06/2012    BMET    Component Value Date/Time   NA 135 06/05/2012 0650   K 3.6 06/05/2012 0650   CL 100 06/05/2012 0650   CO2 26 06/05/2012 0650   GLUCOSE 147* 06/05/2012 0650   BUN 13 06/05/2012 0650   CREATININE 1.06 06/05/2012 0650   CALCIUM 8.8 06/05/2012 0650   GFRNONAA 72* 06/05/2012 0650   GFRAA 83* 06/05/2012 0650    COAG Lab Results  Component Value Date   INR 0.93 06/02/2012   INR 1.6 04/25/2010   INR 2.08* 03/25/2010   No results found for this basename: PTT     Intake/Output Summary (Last 24 hours) at 06/06/12 0952 Last data filed at 06/06/12 0431  Gross per 24 hour  Intake   1320 ml  Output   1200 ml  Net    120 ml   No data found.    Physical Examination  BP Readings from Last 3 Encounters:  06/06/12 139/61  06/06/12 139/61  06/02/12 88/54   Temp Readings from Last 3 Encounters:  06/06/12 98.8 F (37.1 C) Axillary  06/06/12 98.8 F (37.1 C) Axillary  06/02/12 98.3 F (36.8 C)    SpO2 Readings from Last 3 Encounters:  06/06/12 100%  06/06/12 100%  06/02/12 95%   Pulse Readings from Last 3 Encounters:  06/06/12 89  06/06/12 89  06/02/12 75    Pt is A&Ox3  WDWN male with no complaints  Left amputation wound is healing well.  There is good bone coverage in the stump Stump is warm and well perfused, without drainage; without  erythema   Assessment/plan:  Leroy [name] is a 67 y.o. male who is s/p Left Procedure(s): AMPUTATION BELOW KNEE  The patient's stump is viable.  Follow-up 4 weeks from surgery - our office will arrange  Awaiting SNF  Insurance denied CIR  Leroy [name] 9:52 AM 06/06/[phone]  Agree with above Stump healing satisfactorily

## 2012-06-07 ENCOUNTER — Inpatient Hospital Stay (HOSPITAL_COMMUNITY): Payer: Medicare HMO

## 2012-06-07 ENCOUNTER — Encounter (HOSPITAL_COMMUNITY): Payer: Self-pay | Admitting: Radiology

## 2012-06-07 DIAGNOSIS — R5082 Postprocedural fever: Secondary | ICD-10-CM

## 2012-06-07 LAB — BASIC METABOLIC PANEL
Chloride: 98 mEq/L (ref 96–112)
Creatinine, Ser: 1.05 mg/dL (ref 0.50–1.35)
GFR calc Af Amer: 84 mL/min — ABNORMAL LOW (ref >90)
GFR calc non Af Amer: 73 mL/min — ABNORMAL LOW (ref >90)
Potassium: 4 mEq/L (ref 3.5–5.1)

## 2012-06-07 LAB — MAGNESIUM: Magnesium: 2.2 mg/dL (ref 1.5–2.5)

## 2012-06-07 LAB — GLUCOSE, CAPILLARY
Glucose-Capillary: 185 mg/dL — ABNORMAL HIGH (ref 70–99)
Glucose-Capillary: 118 mg/dL — ABNORMAL HIGH (ref 70–99)

## 2012-06-07 LAB — CBC
Platelets: 167 10*3/uL (ref 150–400)
RDW: 12.9 % (ref 11.5–15.5)
WBC: 10.8 10*3/uL — ABNORMAL HIGH (ref 4.0–10.5)

## 2012-06-07 MED ORDER — METOPROLOL TARTRATE 1 MG/ML IV SOLN
5.0000 mg | Freq: Once | INTRAVENOUS | Status: AC
  Administered 2012-06-07: 5 mg via INTRAVENOUS

## 2012-06-07 MED ORDER — DILTIAZEM LOAD VIA INFUSION
10.0000 mg | Freq: Once | INTRAVENOUS | Status: AC
  Administered 2012-06-08: 10 mg via INTRAVENOUS
  Filled 2012-06-07: qty 10

## 2012-06-07 MED ORDER — DILTIAZEM HCL 100 MG IV SOLR
10.0000 mg/h | INTRAVENOUS | Status: DC
  Administered 2012-06-07: 5 mg/h via INTRAVENOUS
  Administered 2012-06-08 (×2): 10 mg/h via INTRAVENOUS
  Filled 2012-06-07 (×3): qty 100

## 2012-06-07 MED ORDER — DEXTROSE 5 % IV SOLN
450.0000 mg | Freq: Three times a day (TID) | INTRAVENOUS | Status: DC
  Administered 2012-06-07 – 2012-06-08 (×3): 450 mg via INTRAVENOUS
  Filled 2012-06-07 (×6): qty 3

## 2012-06-07 MED ORDER — DILTIAZEM LOAD VIA INFUSION
5.0000 mg | Freq: Once | INTRAVENOUS | Status: AC
  Administered 2012-06-07: 5 mg via INTRAVENOUS
  Filled 2012-06-07: qty 5

## 2012-06-07 MED ORDER — SODIUM CHLORIDE 0.9 % IV BOLUS (SEPSIS)
500.0000 mL | Freq: Once | INTRAVENOUS | Status: AC
  Administered 2012-06-07: 500 mL via INTRAVENOUS

## 2012-06-07 MED ORDER — IOHEXOL 350 MG/ML SOLN
100.0000 mL | Freq: Once | INTRAVENOUS | Status: AC | PRN
  Administered 2012-06-07: 80 mL via INTRAVENOUS

## 2012-06-07 NOTE — Progress Notes (Signed)
Pt Hr is in the 170s, BP is 110/68, pt reports sweating but otherwise asymptomatic (no SOB, no pain, no weakness, no dizziness), EKG was done. MD is aware.

## 2012-06-07 NOTE — Progress Notes (Addendum)
Pt's HR is sinus tach and in the 140s, pt is asymptomatic. MD aware; orders received.

## 2012-06-07 NOTE — Progress Notes (Addendum)
[name]  Leroy [name] ZOX:[id number] DOB: 1945/11/25 DOA: 06/03/2012 PCP: Leroy Linger, MD  Brief Narrative: Leroy [name] is a 67 y.o. male with obesity, uncontrolled diabetes, severe peripheral vascular disease was admitted today for elective left BKA. Patient is seen postoperatively in his hospital room. At the current time he denies chest pain or dyspnea but complains of severe stump pain.   Assessment/Plan: 1. Status post left BKA-management per vascular surgery. PT evaluated patient and recommended CIR however there seems to be some problems with insurance. SNF with rehab likely. Stable today.  2. Diabetes mellitus type 2-uncontrolled-last hemoglobin A1c in the system higher than 14. Patient placed back on his Lantus 50 units twice a day and will check CBGs 4 times a day. Better today. Patient does endorse medication non-compliance and food indiscretions at home. He reports that his sugars "run wild".  3. Ischemic cardiomyopathy - stable, will continue to monitor 4. Mild leukocytosis / fever- unclear etiology, patient has no apparent infection though recently had surgery. Did complain of mild dysuria when he had the cath not not anymore. Will send for UA. No breathing problems, no cough, lung exam is unremarkable and he had a normal CXR 5 days ago.   Code Status: Full Family Communication: none  Disposition Plan: SNF when available.   Consultants:  PM&R  Procedures:  Left BKA  Antibiotics:  none  HPI/Subjective: - no complaints this morning; endorsed some sweats when he was febrile last night.   Objective: Filed Vitals:   06/06/12 0436 06/06/12 1322 06/06/12 1943 06/07/12 0507  BP: 139/61 123/30 104/67 112/82  Pulse: 89 89 99 64  Temp: 98.8 F (37.1 C) 98 F (36.7 C) 101.1 F (38.4 C) 98.9 F (37.2 C)  TempSrc: Axillary Oral Oral Oral  Resp: 20 18 20 20   Height:      Weight: 131.18 kg (289 lb 3.2 oz)   131.09 kg (289 lb)  SpO2: 100% 100%  94% 91%    Intake/Output Summary (Last 24 hours) at 06/07/12 1247 Last data filed at 06/07/12 0500  Gross per 24 hour  Intake    600 ml  Output   1500 ml  Net   -900 ml   Filed Weights   06/05/12 0455 06/06/12 0436 06/07/12 0507  Weight: 131.271 kg (289 lb 6.4 oz) 131.18 kg (289 lb 3.2 oz) 131.09 kg (289 lb)    Exam 3/2:   General:  NAD  Cardiovascular: regular rate and rhythm, without MRG  Respiratory: good air movement, clear to auscultation throughout, no wheezing, ronchi or rales  Abdomen: soft, not tender to palpation, positive bowel sounds  MSK: no peripheral edema, s/p left BKA  Neuro: CN 2-12 grossly intact, MS 5/5 throughout  Data Reviewed: Basic Metabolic Panel:  Recent Labs Lab 06/02/12 1127 06/04/12 0520 06/05/12 0650 06/07/12 0945  NA 138 134* 135 134*  K 4.5 4.1 3.6 4.0  CL 101 98 100 98  CO2 27 25 26 25   GLUCOSE 226* 206* 147* 114*  BUN 16 14 13 13   CREATININE 1.10 1.08 1.06 1.05  CALCIUM 9.9 9.4 8.8 9.3  MG  --   --   --  2.2   Liver Function Tests:  Recent Labs Lab 06/02/12 1127  AST 13  ALT 13  ALKPHOS 103  BILITOT 0.2*  PROT 7.2  ALBUMIN 3.0*   CBC:  Recent Labs Lab 06/02/12 1127 06/04/12 0520 06/05/12 0650 06/06/12 0440 06/07/12 0945  WBC 6.5 10.1 12.4* 11.8*  10.8*  HGB 15.5 14.9 12.7* 12.3* 12.6*  HCT 43.3 42.2 36.3* 35.8* 36.3*  MCV 86.1 86.7 86.2 87.5 87.1  PLT 193 171 171 168 167   CBG:  Recent Labs Lab 06/06/12 1115 06/06/12 1614 06/06/12 2117 06/07/12 0604 06/07/12 1119  GLUCAP 94 132* 185* 109* 97   Recent Results (from the past 240 hour(s))  SURGICAL PCR SCREEN     Status: Abnormal   Collection Time    06/02/12 11:28 AM      Result Value Range Status   MRSA, PCR NEGATIVE  NEGATIVE Final   Staphylococcus aureus POSITIVE (*) NEGATIVE Final   Comment:            The Xpert SA Assay (FDA     approved for NASAL specimens     in patients over 20 years of age),     is one component of     a  comprehensive surveillance     program.  Test performance has     been validated by [employer] for patients greater     than or equal to 36 year old.     It is not intended     to diagnose infection nor to     guide or monitor treatment.     Scheduled Meds: . aspirin  325 mg Oral Daily  . colesevelam  1,875 mg Oral BID WC  . docusate sodium  100 mg Oral Daily  . enoxaparin (LOVENOX) injection  40 mg Subcutaneous Q24H  . insulin aspart  0-15 Units Subcutaneous TID WC  . insulin aspart  0-5 Units Subcutaneous QHS  . insulin glargine  50 Units Subcutaneous BID  . linagliptin  5 mg Oral Q breakfast  . metFORMIN  1,000 mg Oral BID WC  . metoprolol succinate  50 mg Oral Daily  . pantoprazole  40 mg Oral Daily   Continuous Infusions:   Active Problems:   CAD   CARDIOMYOPATHY   ATRIAL FIBRILLATION, PAROXYSMAL   Type II or unspecified type diabetes mellitus with neurological manifestations, uncontrolled(250.62)   Atherosclerosis of native arteries of the extremities with gangrene   Critical lower limb ischemia   Leroy Pert, MD [facility] Pager [phone]. If 7 PM - 7 AM, please contact night-coverage at [email or web address], password [facility] 06/07/2012, 12:47 PM  LOS: 4 days    Addendum 3:20 PM Patient's heart rate has been steadily increasing throughout the day today. His heart rate this afternoon was as high as 140, but most times around 100, sinus tachycardia. He denies any respiratory or urinary symptoms, denies any fever or chills, and is feeling generally well. I evaluated the left lower extremity surgical site. Staples are in place and I could not appreciate any purulent discharges, however there is warmth with some cellulitic changes above the amputation site. I will start clindamycin.

## 2012-06-07 NOTE — Progress Notes (Signed)
Pt had bigeminy PVCs. Checked on pt and he was asymptomatic and talking to relative on phone. Will continue to monitor.

## 2012-06-07 NOTE — Progress Notes (Signed)
Pt has not voided since early this AM. MD is aware, pt states it is normal for him to go for awhile without voiding. Will continue to monitor.

## 2012-06-08 ENCOUNTER — Telehealth: Payer: Self-pay | Admitting: Vascular Surgery

## 2012-06-08 DIAGNOSIS — I471 Supraventricular tachycardia: Secondary | ICD-10-CM

## 2012-06-08 DIAGNOSIS — L0291 Cutaneous abscess, unspecified: Secondary | ICD-10-CM

## 2012-06-08 LAB — BASIC METABOLIC PANEL
BUN: 15 mg/dL (ref 6–23)
Calcium: 9 mg/dL (ref 8.4–10.5)
Creatinine, Ser: 1.12 mg/dL (ref 0.50–1.35)
GFR calc Af Amer: 78 mL/min — ABNORMAL LOW (ref >90)
GFR calc non Af Amer: 67 mL/min — ABNORMAL LOW (ref >90)
Potassium: 3.7 mEq/L (ref 3.5–5.1)

## 2012-06-08 LAB — GLUCOSE, CAPILLARY: Glucose-Capillary: 135 mg/dL — ABNORMAL HIGH (ref 70–99)

## 2012-06-08 LAB — URINALYSIS, ROUTINE W REFLEX MICROSCOPIC
Bilirubin Urine: NEGATIVE
Glucose, UA: NEGATIVE mg/dL
Ketones, ur: NEGATIVE mg/dL
Leukocytes, UA: NEGATIVE
Nitrite: NEGATIVE
Protein, ur: 100 mg/dL — AB
Specific Gravity, Urine: 1.02 (ref 1.005–1.030)
Urobilinogen, UA: 0.2 mg/dL (ref 0.0–1.0)
pH: 5 (ref 5.0–8.0)

## 2012-06-08 LAB — CBC
MCHC: 35.1 g/dL (ref 30.0–36.0)
Platelets: 200 10*3/uL (ref 150–400)
RDW: 13.3 % (ref 11.5–15.5)

## 2012-06-08 LAB — URINE MICROSCOPIC-ADD ON

## 2012-06-08 MED ORDER — METOPROLOL SUCCINATE ER 50 MG PO TB24
75.0000 mg | ORAL_TABLET | Freq: Every day | ORAL | Status: DC
  Administered 2012-06-09 – 2012-06-11 (×3): 75 mg via ORAL
  Filled 2012-06-08 (×3): qty 1

## 2012-06-08 MED ORDER — CLINDAMYCIN HCL 300 MG PO CAPS
450.0000 mg | ORAL_CAPSULE | Freq: Four times a day (QID) | ORAL | Status: DC
  Administered 2012-06-08 – 2012-06-11 (×12): 450 mg via ORAL
  Filled 2012-06-08 (×16): qty 1

## 2012-06-08 MED ORDER — METOPROLOL SUCCINATE ER 25 MG PO TB24
25.0000 mg | ORAL_TABLET | Freq: Once | ORAL | Status: AC
  Administered 2012-06-08: 25 mg via ORAL
  Filled 2012-06-08: qty 1

## 2012-06-08 MED ORDER — POLYETHYLENE GLYCOL 3350 17 G PO PACK
17.0000 g | PACK | Freq: Two times a day (BID) | ORAL | Status: DC
  Administered 2012-06-08 – 2012-06-09 (×3): 17 g via ORAL
  Filled 2012-06-08 (×4): qty 1

## 2012-06-08 NOTE — Telephone Encounter (Addendum)
Message copied by Shari Prows on Mon Jun 08, 2012  2:25 PM ------      Message from: Melene Plan      Created: Mon Jun 08, 2012 10:21 AM                   ----- Message -----         From: [name], MD         Sent: 06/08/2012   7:29 AM           To: Melene Plan, RN            Just needs 4 week follow-up.  Will address right leg at that time.            ----- Message -----         From: Melene Plan, RN         Sent: 06/05/2012   3:07 PM           To: [name], MD            WHAT DO I NEED TO SET HIM UP FOR AND WHAT DOES HE NEED?JJK      ----- Message -----         From: [name], PA         Sent: 06/05/2012   7:51 AM           To: Melene Plan, RN            Left BKA has PAD on right LE needs further tretment per Dr. Imogene Burn             ------Pt is already scheduled to see [facility] 07/03/12.awt

## 2012-06-08 NOTE — Progress Notes (Addendum)
[facility] [place]  Postoperative Visit - Amputation  Date of Surgery: 06/03/2012 Procedure(s): AMPUTATION BELOW KNEE Left Surgeon: Surgeon(s): [name], MD POD: 5 Days Post-Op  Subjective Leroy [name] is a 67 y.o. male who is S/P Left Procedure(s): AMPUTATION BELOW KNEE.  Pt.reports increased pain in the stump. The patient notes pain is well controlled. Pt. denies phantom pain.  Significant Diagnostic Studies: CBC Lab Results  Component Value Date   WBC 10.8* 06/07/2012   HGB 12.6* 06/07/2012   HCT 36.3* 06/07/2012   MCV 87.1 06/07/2012   PLT 167 06/07/2012    BMET    Component Value Date/Time   NA 134* 06/07/2012 0945   K 4.0 06/07/2012 0945   CL 98 06/07/2012 0945   CO2 25 06/07/2012 0945   GLUCOSE 114* 06/07/2012 0945   BUN 13 06/07/2012 0945   CREATININE 1.05 06/07/2012 0945   CALCIUM 9.3 06/07/2012 0945   GFRNONAA 73* 06/07/2012 0945   GFRAA 84* 06/07/2012 0945    COAG Lab Results  Component Value Date   INR 0.93 06/02/2012   INR 1.6 04/25/2010   INR 2.08* 03/25/2010   No results found for this basename: PTT     Intake/Output Summary (Last 24 hours) at 06/08/12 0758 Last data filed at 06/07/12 2200  Gross per 24 hour  Intake    480 ml  Output      0 ml  Net    480 ml   No data found.    Physical Examination  BP Readings from Last 3 Encounters:  06/08/12 118/45  06/08/12 118/45  06/02/12 88/54   Temp Readings from Last 3 Encounters:  06/08/12 98.7 F (37.1 C) Oral  06/08/12 98.7 F (37.1 C) Oral  06/02/12 98.3 F (36.8 C)    SpO2 Readings from Last 3 Encounters:  06/08/12 100%  06/08/12 100%  06/02/12 95%   Pulse Readings from Last 3 Encounters:  06/08/12 78  06/08/12 78  06/02/12 75    Pt is A&Ox3  WDWN male with no complaints  Left amputation wound is healing well.  There is good bone coverage in the stump Stump is warm and well perfused, without drainage; without erythema   Assessment/plan:  Leroy [name] is a 67  y.o. male who is s/p Left Procedure(s): AMPUTATION BELOW KNEE  The patient's stump is viable.  Increased pain at incision.  No sign of infection.  Follow-up 4 weeks from surgery  Sinus 83 this am.  Clinton Gallant [facility] 7:58 AM 06/08/[phone]  Addendum  I have independently interviewed and examined the patient, and I agree with the physician assistant's findings.  Viable Left BKA stump.  Staples out in 4 weeks in the office.  [name], MD [facility] [place] Office: [phone] Pager: [phone]  06/08/2012, 8:11 AM

## 2012-06-08 NOTE — Progress Notes (Signed)
Subjective:  No chest pain or dyspnea. 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  Objective:  Vital Signs in the last 24 hours: Temp:  [98 F (36.7 C)-98.7 F (37.1 C)] 98.7 F (37.1 C) (03/03 0344) Pulse Rate:  [78-155] 78 (03/03 0344) Resp:  [18-20] 18 (03/03 0344) BP: (107-118)/(45-101) 118/45 mmHg (03/03 0344) SpO2:  [99 %-100 %] 100 % (03/03 0344) Weight:  [292 lb 12.3 oz (132.8 kg)] 292 lb 12.3 oz (132.8 kg) (03/03 0349)  Intake/Output from previous day: 03/02 0701 - 03/03 0700 In: 797.4 [P.O.:480; I.V.:158.4; IV Piggyback:159] Out: -  Intake/Output from this shift:    . aspirin  325 mg Oral Daily  . clindamycin  450 mg Oral Q6H  . colesevelam  1,875 mg Oral BID WC  . docusate sodium  100 mg Oral Daily  . enoxaparin (LOVENOX) injection  40 mg Subcutaneous Q24H  . insulin aspart  0-15 Units Subcutaneous TID WC  . insulin aspart  0-5 Units Subcutaneous QHS  . insulin glargine  50 Units Subcutaneous BID  . linagliptin  5 mg Oral Q breakfast  . metoprolol succinate  50 mg Oral Daily  . pantoprazole  40 mg Oral Daily  . polyethylene glycol  17 g Oral BID   . diltiazem (CARDIZEM) infusion 10 mg/hr (06/08/12 0741)    Physical Exam: The patient appears to be in no distress.  Head and neck exam reveals that the pupils are equal and reactive.  The extraocular movements are full.  There is no scleral icterus.  Mouth and pharynx are benign.  No lymphadenopathy.  No carotid bruits.  The jugular venous pressure is normal.  Thyroid is not enlarged or tender.  Chest is clear to percussion and auscultation.  No rales or rhonchi.  Expansion of the chest is symmetrical.  Heart reveals no abnormal lift or heave.  First and second heart sounds are normal.  There is no murmur gallop rub or click.  The abdomen is soft and nontender.  Bowel sounds are normoactive.  There is no hepatosplenomegaly or mass.  There are no abdominal  bruits.  Extremities reveal recent left BKA dressing in place.  Neurologic exam grossly normal.  Integument reveals no rash  Lab Results:  Recent Labs  06/07/12 0945 06/08/12 0728  WBC 10.8* 9.5  HGB 12.6* 11.9*  PLT 167 200    Recent Labs  06/07/12 0945 06/08/12 0728  NA 134* 134*  K 4.0 3.7  CL 98 100  CO2 25 23  GLUCOSE 114* 99  BUN 13 15  CREATININE 1.05 1.12   No results found for this basename: TROPONINI, CK, MB,  in the last 72 hours Hepatic Function Panel No results found for this basename: PROT, ALBUMIN, AST, ALT, ALKPHOS, BILITOT, BILIDIR, IBILI,  in the last 72 hours No results found for this basename: CHOL,  in the last 72 hours No results found for this basename: PROTIME,  in the last 72 hours  Imaging: Imaging results have been reviewed  Cardiac Studies: Telemetry shows NSR Assessment/Plan:  1. 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 2. CAD s/p CABG and PCI  3. CHF, LVEF 30-35% by TTE from 05/12/2012  4. DM s/p left BKA  Plan: DC IV cardizem. Uptitrate beta blocker (BP soft so cannot be too aggressive). Will give an additional 25 mg toprol now then begin 75 mg daily tomorrow.   LOS: 5 days    Cassell Clement 06/08/2012, 7:20 PM

## 2012-06-08 NOTE — Consult Note (Signed)
Reason for Consult: SVT  Referring Physician: [facility]  Cardiologist: Dr. Lolita Cram is an 67 y.o. male.   HPI: 67 y/o male who is currently been seen in consultation with [facility] physicians for evaluation of SVT. He has a history of CAD s/p CABG in 2001 at [place] [facility] [place] (records not available).  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  He was noted to be tachycardic on telemetry.  EKG showed SVT with heart rate 157 bpm and patient was asymptomatic and hemodynamically stable.  He received Metoprolol 5 mg iv twice before cardiology consultation.  Currently, his heart rate is 142 bpm, and he denies chest pain, shortness of breath, palpitation, dizziness or syncope. His TTE from 05/12/2012 showed LVEF 30-35%.  Past Medical History  Diagnosis Date  . Hyperlipidemia   . CHF (congestive heart failure)     EF 40% 2010  . Chronic anticoagulation   . Cataract   . Hypotension   . Paroxysmal atrial fibrillation     takes Metoprolol daily  . Coronary artery disease   . Peripheral vascular disease   . History of blood clots     in legs--this was in 2011--has been off of Coumadin 77months;takes Xerelto daily  . Peripheral neuropathy   . Joint pain   . H/O hiatal hernia   . Chronic kidney disease     on Lisinopril to protect kidneys d/t being on Metformin per pt  . Diabetes mellitus     takes Metformni daily  and Lantus 50units bid  . Myocardial infarction     total of 8 heart attacks;last one in 2011    Past Surgical History  Procedure Laterality Date  . Revasculariztion  2011/2010    x 2   . Coronary artery bypass graft  03/2000    quadruple  . Tonsillectomy    . Hernia repair    . Cardiac catheterization  03/19/10  . Abdominal aortagram  04-30-12  . Coronary angioplasty      3 stents  . Adenoidectomy    . Amputation Left 06/03/2012    Procedure: AMPUTATION BELOW KNEE;  Surgeon: [name], MD;  Location: [facility]  OR;  Service: Vascular;  Laterality: Left;    Family History  Problem Relation Age of Onset  . Heart disease Mother     before age 59  . Diabetes Mother   . Heart attack Mother   . Heart disease Father     Social History:  reports that he has quit smoking. His smoking use included Cigarettes. He smoked 0.00 packs per day. He has never used smokeless tobacco. He reports that  drinks alcohol. He reports that he does not use illicit drugs.  Allergies:  Allergies  Allergen Reactions  . Statins     Muscle aches  . Amlodipine Besylate     Muscle aches making difficult to walk  . Cephalexin     REACTION: Hives    Medications:   Toprol XL 50 mg q daily ASA 81 mg q daily  Results for orders placed during the hospital encounter of 06/03/12 (from the past 48 hour(s))  CBC     Status: Abnormal   Collection Time    06/06/12  4:40 AM      Result Value Range   WBC 11.8 (*) 4.0 - 10.5 K/uL   RBC 4.09 (*) 4.22 - 5.81 MIL/uL   Hemoglobin 12.3 (*) 13.0 -  17.0 g/dL   HCT 40.9 (*) 81.1 - 91.4 %   MCV 87.5  78.0 - 100.0 fL   MCH 30.1  26.0 - 34.0 pg   MCHC 34.4  30.0 - 36.0 g/dL   RDW 78.2  95.6 - 21.3 %   Platelets 168  150 - 400 K/uL  GLUCOSE, CAPILLARY     Status: Abnormal   Collection Time    06/06/12  6:28 AM      Result Value Range   Glucose-Capillary 127 (*) 70 - 99 mg/dL  GLUCOSE, CAPILLARY     Status: Abnormal   Collection Time    06/06/12  6:57 AM      Result Value Range   Glucose-Capillary 136 (*) 70 - 99 mg/dL  GLUCOSE, CAPILLARY     Status: None   Collection Time    06/06/12 11:15 AM      Result Value Range   Glucose-Capillary 94  70 - 99 mg/dL  GLUCOSE, CAPILLARY     Status: Abnormal   Collection Time    06/06/12  4:14 PM      Result Value Range   Glucose-Capillary 132 (*) 70 - 99 mg/dL  GLUCOSE, CAPILLARY     Status: Abnormal   Collection Time    06/06/12  9:17 PM      Result Value Range   Glucose-Capillary 185 (*) 70 - 99 mg/dL  GLUCOSE, CAPILLARY      Status: Abnormal   Collection Time    06/07/12  6:04 AM      Result Value Range   Glucose-Capillary 109 (*) 70 - 99 mg/dL  CBC     Status: Abnormal   Collection Time    06/07/12  9:45 AM      Result Value Range   WBC 10.8 (*) 4.0 - 10.5 K/uL   RBC 4.17 (*) 4.22 - 5.81 MIL/uL   Hemoglobin 12.6 (*) 13.0 - 17.0 g/dL   HCT 08.6 (*) 57.8 - 46.9 %   MCV 87.1  78.0 - 100.0 fL   MCH 30.2  26.0 - 34.0 pg   MCHC 34.7  30.0 - 36.0 g/dL   RDW 62.9  52.8 - 41.3 %   Platelets 167  150 - 400 K/uL  BASIC METABOLIC PANEL     Status: Abnormal   Collection Time    06/07/12  9:45 AM      Result Value Range   Sodium 134 (*) 135 - 145 mEq/L   Potassium 4.0  3.5 - 5.1 mEq/L   Chloride 98  96 - 112 mEq/L   CO2 25  19 - 32 mEq/L   Glucose, Bld 114 (*) 70 - 99 mg/dL   BUN 13  6 - 23 mg/dL   Creatinine, Ser 2.44  0.50 - 1.35 mg/dL   Calcium 9.3  8.4 - 01.0 mg/dL   GFR calc non Af Amer 73 (*) >90 mL/min   GFR calc Af Amer 84 (*) >90 mL/min   Comment:            The eGFR has been calculated     using the CKD EPI equation.     This calculation has not been     validated in all clinical     situations.     eGFR's persistently     <90 mL/min signify     possible Chronic Kidney Disease.  MAGNESIUM     Status: None   Collection Time    06/07/12  9:45 AM  Result Value Range   Magnesium 2.2  1.5 - 2.5 mg/dL  GLUCOSE, CAPILLARY     Status: None   Collection Time    06/07/12 11:19 AM      Result Value Range   Glucose-Capillary 97  70 - 99 mg/dL  GLUCOSE, CAPILLARY     Status: Abnormal   Collection Time    06/07/12  4:30 PM      Result Value Range   Glucose-Capillary 107 (*) 70 - 99 mg/dL  GLUCOSE, CAPILLARY     Status: Abnormal   Collection Time    06/07/12  9:32 PM      Result Value Range   Glucose-Capillary 118 (*) 70 - 99 mg/dL    Ct Angio Chest Pe W/cm &/or Wo Cm  06/07/2012  *RADIOLOGY REPORT*  Clinical Data: Tachycardia, short of breath, history of blood clot  CT ANGIOGRAPHY CHEST   Technique:  Multidetector CT imaging of the chest using the standard protocol during bolus administration of intravenous contrast. Multiplanar reconstructed images including MIPs were obtained and reviewed to evaluate the vascular anatomy.  Contrast: 80mL OMNIPAQUE IOHEXOL 350 MG/ML SOLN  Comparison: None.  Findings: There are no filling defects within the pulmonary arteries to suggest acute pulmonary embolism. Within the right lower lobe pulmonary artery, there is a small linear wall adherent filling defect (image 137 through 140, series 7) which likely represents synechiae of a recannulated remote pulmonary embolism.  No acute findings of the aorta great vessels.  No pericardial fluid.  Esophagus is normal.  The subcarinal lymph node measuring 11 mm and is borderline by size criteria for pathology.  Review of the lung parenchyma demonstrates no pneumothorax or pleural fluid.  No pulmonary edema.  Limited view of the upper abdomen is unremarkable.  Limited view of the skeleton demonstrates midline sternotomy.  IMPRESSION:  1.  No evidence acute pulmonary embolism. 2.  Small linear filling defect within the right lower lobe pulmonary likely represents a synechiae of recannulized remote pulmonary embolism.   Original Report Authenticated By: Genevive Bi, M.D.     Review of Systems  Constitutional: Negative for fever, chills, weight loss, malaise/fatigue and diaphoresis.  HENT: Negative for hearing loss, ear pain, nosebleeds, congestion, sore throat, neck pain, tinnitus and ear discharge.   Eyes: Negative for blurred vision, double vision, photophobia, pain, discharge and redness.  Respiratory: Negative for cough, hemoptysis, sputum production, shortness of breath, wheezing and stridor.   Cardiovascular: Positive for palpitations. Negative for chest pain, orthopnea, claudication and leg swelling.  Gastrointestinal: Negative for heartburn, nausea, vomiting, abdominal pain, diarrhea, constipation, blood in  stool and melena.  Genitourinary: Negative for dysuria, urgency, frequency, hematuria and flank pain.  Musculoskeletal: Negative for myalgias, back pain and joint pain.  Skin: Negative for itching and rash.  Neurological: Negative for dizziness, tingling, tremors, sensory change, speech change, focal weakness, seizures, weakness and headaches.  Psychiatric/Behavioral: Negative for suicidal ideas and hallucinations.   Blood pressure 116/101, pulse 155, temperature 98 F (36.7 C), temperature source Oral, resp. rate 20, height 6\' 9"  (2.057 m), weight 131.09 kg (289 lb), SpO2 99.00%. Physical Exam  Constitutional: He is oriented to person, place, and time. He appears well-developed and well-nourished. No distress.  HENT:  Head: Normocephalic.  Eyes: EOM are normal. Right eye exhibits no discharge. Left eye exhibits no discharge. No scleral icterus.  Neck: No JVD present. No tracheal deviation present.  Cardiovascular: Exam reveals no gallop and no friction rub.   No murmur heard. Tachycardiac with  regular rhythm  Respiratory: No stridor. No respiratory distress. He has no wheezes. He has no rales. He exhibits no tenderness.  GI: He exhibits no distension. There is no tenderness. There is no rebound and no guarding.  Musculoskeletal: He exhibits no edema.  Neurological: He is alert and oriented to person, place, and time.  Skin: No rash noted. He is not diaphoretic. No erythema.  Psychiatric: He has a normal mood and affect.    Assessment/Plan:  1. 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 CAD s/p CABG and PCI 3. CHF, LVEF 30-35% by TTE from 05/12/2012 4. DM s/p left BKA  Patient is currently on telemetry monitor with a heart rate of 140-145 bpm.  I will recommend starting the patient on Cardizem drip and up-titrating as tolerated by blood pressure.  Once he convert to sinus rhythm he will likely need modification of his Toprol XL dose as well. I will also recommend checking electrolytes and thyroid function  test. If he becomes symptomatic, we will consider D/C cardioversion.  AITSEBAOMO, JULIUS E 06/08/2012, 12:03 AM

## 2012-06-08 NOTE — Progress Notes (Signed)
Physical Therapy Treatment Patient Details Name: Leroy [name] MRN: [id number] DOB: Aug 27, 1945 Today's Date: 06/08/2012 Time: 1320-1330 PT Time Calculation (min): 10 min  PT Assessment / Plan / Recommendation Comments on Treatment Session  Pt had been up OOB x 2 earlier today, he wanted to stay in bed this afternoon. Agreed to ther ex for LLE. Moves LLE well, pt states he's been doing exercises independently.     Follow Up Recommendations  SNF     Does the patient have the potential to tolerate intense rehabilitation     Barriers to Discharge        Equipment Recommendations  Rolling walker with 5" wheels    Recommendations for Other Services    Frequency Min 3X/week   Plan Discharge plan remains appropriate    Precautions / Restrictions Restrictions Weight Bearing Restrictions: Yes LLE Weight Bearing: Non weight bearing   Pertinent Vitals/Pain *5/10 LLE at rest and with activity premedicated*    Mobility  Bed Mobility Bed Mobility: Not assessed Transfers Transfers: Not assessed (pt reports he was up OOB x 2 today, wants to be in bed)    Exercises General Exercises - Lower Extremity Quad Sets: AROM;Both;10 reps;Supine Gluteal Sets: AROM;Both;10 reps;Supine Heel Slides: AROM;Left;10 reps Hip ABduction/ADduction: Left;AROM;10 reps;Supine (pillow squeezes x 10, hip ABD x 10 AROM) Straight Leg Raises: Left;10 reps;Supine   PT Diagnosis:    PT Problem List:   PT Treatment Interventions:     PT Goals Acute Rehab PT Goals PT Goal Formulation: With patient Time For Goal Achievement: 06/11/12 Potential to Achieve Goals: Good Pt will Roll Supine to Right Side: with supervision Pt will Roll Supine to Left Side: with supervision Pt will go Supine/Side to Sit: with supervision Pt will go Sit to Supine/Side: with supervision Pt will go Sit to Stand: with mod assist Pt will go Stand to Sit: with mod assist Pt will Transfer Bed to Chair/Chair to Bed: with mod assist Pt  will Stand: with mod assist;3 - 5 min;with bilateral upper extremity support Pt will Perform Home Exercise Program: Independently PT Goal: Perform Home Exercise Program - Progress: Progressing toward goal  Visit Information  Last PT Received On: 06/08/12 Assistance Needed: +2    Subjective Data  Subjective: I just got back to bed.  I've been doing those exercises.  Patient Stated Goal: to walk with prothetic leg   Cognition  Cognition Overall Cognitive Status: Appears within functional limits for tasks assessed/performed Arousal/Alertness: Awake/alert Orientation Level: Appears intact for tasks assessed Behavior During Session: [facility] for tasks performed Current Attention Level: Focused Following Commands: Follows multi-step commands consistently    Balance     End of Session PT - End of Session Activity Tolerance: Patient tolerated treatment well Patient left: in bed;with call bell/phone within reach Nurse Communication: Mobility status;Need for lift equipment   GP     Tamala Ser 06/08/2012, 1:39 PM [phone]

## 2012-06-08 NOTE — Progress Notes (Signed)
[name]  MACIO KISSOON GNF:[id number] DOB: [birth year] DOA: 06/03/2012 PCP: [name], MD  Brief Narrative: Leroy [name] is a 67 y.o. male with obesity, uncontrolled diabetes, severe peripheral vascular disease was admitted today for elective left BKA. Patient is seen postoperatively in his hospital room. At the current time he denies chest pain or dyspnea but complains of severe stump pain.   Assessment/Plan: 1. SVT - cardiology has been consulted last night, to a certain on diltiazem drip. He is now in sinus rhythm. Would appreciate cardiology input about transitioning him to by mouth Cardizem versus continuing him on the Toprol at a higher dose. 2. Status post left BKA-management per vascular surgery. PT evaluated patient and recommended CIR however there seems to be some problems with insurance. SNF with rehab likely.  3. Leukocytosis/fever/tachycardia - I appreciate with some cellulitic changes last night at the surgical site. Will will continue clindamycin by mouth for a total of 7 days. 4. Diabetes mellitus type 2-uncontrolled-last hemoglobin A1c in the system higher than 14. Patient placed back on his Lantus 50 units twice a day and will check CBGs 4 times a day with good controlled here.  5. Ischemic cardiomyopathy - stable, will continue to monitor  Code Status: Full Family Communication: none  Disposition Plan: SNF when available.   Consultants:  PM&R  Cardiology  Procedures:  Left BKA  Antibiotics:  Clindamycin, started 06/07/2012.  HPI/Subjective: - Feels overall well this morning, he endorses some constipation.  Objective: Filed Vitals:   06/07/12 2128 06/08/12 0002 06/08/12 0344 06/08/12 0349  BP: 116/101 107/57 118/45   Pulse:   78   Temp:   98.7 F (37.1 C)   TempSrc:   Oral   Resp:   18   Height:      Weight:    132.8 kg (292 lb 12.3 oz)  SpO2:   100%     Intake/Output Summary (Last 24 hours) at 06/08/12 0745 Last data  filed at 06/07/12 2200  Gross per 24 hour  Intake    480 ml  Output      0 ml  Net    480 ml   Filed Weights   06/06/12 0436 06/07/12 0507 06/08/12 0349  Weight: 131.18 kg (289 lb 3.2 oz) 131.09 kg (289 lb) 132.8 kg (292 lb 12.3 oz)    Exam 3/2:   General:  NAD  Cardiovascular: regular rate and rhythm, without MRG  Respiratory: good air movement, clear to auscultation throughout, no wheezing, ronchi or rales  Abdomen: soft, not tender to palpation, positive bowel sounds  MSK: no peripheral edema, s/p left BKA  Neuro: CN 2-12 grossly intact, MS 5/5 throughout  Data Reviewed: Basic Metabolic Panel:  Recent Labs Lab 06/02/12 1127 06/04/12 0520 06/05/12 0650 06/07/12 0945  NA 138 134* 135 134*  K 4.5 4.1 3.6 4.0  CL 101 98 100 98  CO2 27 25 26 25   GLUCOSE 226* 206* 147* 114*  BUN 16 14 13 13   CREATININE 1.10 1.08 1.06 1.05  CALCIUM 9.9 9.4 8.8 9.3  MG  --   --   --  2.2   Liver Function Tests:  Recent Labs Lab 06/02/12 1127  AST 13  ALT 13  ALKPHOS 103  BILITOT 0.2*  PROT 7.2  ALBUMIN 3.0*   CBC:  Recent Labs Lab 06/02/12 1127 06/04/12 0520 06/05/12 0650 06/06/12 0440 06/07/12 0945  WBC 6.5 10.1 12.4* 11.8* 10.8*  HGB 15.5 14.9 12.7* 12.3* 12.6*  HCT 43.3 42.2 36.3* 35.8* 36.3*  MCV 86.1 86.7 86.2 87.5 87.1  PLT 193 171 171 168 167   CBG:  Recent Labs Lab 06/07/12 0604 06/07/12 1119 06/07/12 1630 06/07/12 2132 06/08/12 0616  GLUCAP 109* 97 107* 118* 117*   Recent Results (from the past 240 hour(s))  SURGICAL PCR SCREEN     Status: Abnormal   Collection Time    06/02/12 11:28 AM      Result Value Range Status   MRSA, PCR NEGATIVE  NEGATIVE Final   Staphylococcus aureus POSITIVE (*) NEGATIVE Final   Comment:            The Xpert SA Assay (FDA     approved for NASAL specimens     in patients over 66 years of age),     is one component of     a comprehensive surveillance     program.  Test performance has     been validated by  [employer] for patients greater     than or equal to 93 year old.     It is not intended     to diagnose infection nor to     guide or monitor treatment.     Scheduled Meds: . aspirin  325 mg Oral Daily  . clindamycin (CLEOCIN) IV  450 mg Intravenous Q8H  . colesevelam  1,875 mg Oral BID WC  . docusate sodium  100 mg Oral Daily  . enoxaparin (LOVENOX) injection  40 mg Subcutaneous Q24H  . insulin aspart  0-15 Units Subcutaneous TID WC  . insulin aspart  0-5 Units Subcutaneous QHS  . insulin glargine  50 Units Subcutaneous BID  . linagliptin  5 mg Oral Q breakfast  . metoprolol succinate  50 mg Oral Daily  . pantoprazole  40 mg Oral Daily   Continuous Infusions: . 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 If 7 PM - 7 AM, please contact night-coverage at [email or web address], password [facility] 06/08/2012, 7:45 AM  LOS: 5 days

## 2012-06-09 DIAGNOSIS — K5900 Constipation, unspecified: Secondary | ICD-10-CM

## 2012-06-09 DIAGNOSIS — I4891 Unspecified atrial fibrillation: Secondary | ICD-10-CM

## 2012-06-09 LAB — GLUCOSE, CAPILLARY
Glucose-Capillary: 144 mg/dL — ABNORMAL HIGH (ref 70–99)
Glucose-Capillary: 80 mg/dL (ref 70–99)
Glucose-Capillary: 122 mg/dL — ABNORMAL HIGH (ref 70–99)

## 2012-06-09 MED ORDER — FLEET ENEMA 7-19 GM/118ML RE ENEM
1.0000 | ENEMA | Freq: Once | RECTAL | Status: DC
  Filled 2012-06-09: qty 1

## 2012-06-09 MED ORDER — POLYETHYLENE GLYCOL 3350 17 G PO PACK
17.0000 g | PACK | Freq: Three times a day (TID) | ORAL | Status: DC
  Administered 2012-06-09 – 2012-06-11 (×5): 17 g via ORAL
  Filled 2012-06-09 (×8): qty 1

## 2012-06-09 MED ORDER — FLEET ENEMA 7-19 GM/118ML RE ENEM
1.0000 | ENEMA | Freq: Once | RECTAL | Status: AC
  Administered 2012-06-09: 16:00:00 via RECTAL
  Filled 2012-06-09 (×2): qty 1

## 2012-06-09 MED ORDER — LISINOPRIL 2.5 MG PO TABS
2.5000 mg | ORAL_TABLET | Freq: Every day | ORAL | Status: DC
  Administered 2012-06-09 – 2012-06-11 (×3): 2.5 mg via ORAL
  Filled 2012-06-09 (×4): qty 1

## 2012-06-09 NOTE — Progress Notes (Signed)
   SUBJECTIVE:  Denies chest pain or SOB   PHYSICAL EXAM Filed Vitals:   06/08/12 0344 06/08/12 0349 06/08/12 1932 06/09/12 0400  BP: 118/45  138/58 130/41  Pulse: 78  74 90  Temp: 98.7 F (37.1 C)  98.2 F (36.8 C) 98.4 F (36.9 C)  TempSrc: Oral  Oral Oral  Resp: 18  18 18   Height:      Weight:  292 lb 12.3 oz (132.8 kg)  294 lb 12.1 oz (133.7 kg)  SpO2: 100%  99% 100%   General:  No distress Lungs:  Clear Heart:  RRR Abdomen:  Positive bowel sounds, no rebound no guarding Extremities:  No edema.   LABS:  Results for orders placed during the hospital encounter of 06/03/12 (from the past 24 hour(s))  GLUCOSE, CAPILLARY     Status: Abnormal   Collection Time    06/08/12  4:29 PM      Result Value Range   Glucose-Capillary 102 (*) 70 - 99 mg/dL   Comment 1 Documented in Chart     Comment 2 Notify RN    GLUCOSE, CAPILLARY     Status: None   Collection Time    06/08/12  8:57 PM      Result Value Range   Glucose-Capillary 85  70 - 99 mg/dL  GLUCOSE, CAPILLARY     Status: None   Collection Time    06/09/12  5:48 AM      Result Value Range   Glucose-Capillary 82  70 - 99 mg/dL  GLUCOSE, CAPILLARY     Status: Abnormal   Collection Time    06/09/12 11:19 AM      Result Value Range   Glucose-Capillary 144 (*) 70 - 99 mg/dL   Comment 1 Notify RN      Intake/Output Summary (Last 24 hours) at 06/09/12 1319 Last data filed at 06/09/12 1059  Gross per 24 hour  Intake 926.67 ml  Output   1925 ml  Net -998.33 ml    ASSESSMENT AND PLAN:  ATRIAL FIBRILLATION/FLUTTER, PAROXYSMAL  The patient is currently in sinus rhythm. He should be on long-term anticoagulation and was to be on Xarelto in the past. I would likely prescribe this again when OK from a surgical standpoint. Of course he will need to demonstrate medical compliance for this to be safe and to reduce his risk of stroke.   CARDIOMYOPATHY  He seems to be euvolemic.  I will resume a low dose of  Lisinopril.   CAD No active ischemia.  Continue current therapy.       Fayrene Fearing Hochrein 06/09/2012 1:19 PM

## 2012-06-09 NOTE — Progress Notes (Signed)
[name]  YARDLEY LEKAS ZOX:[id number] DOB: 1945/09/22 DOA: 06/03/2012 PCP: [name], MD  Brief Narrative: Leroy [name] is a 67 y.o. male with obesity, uncontrolled diabetes, severe peripheral vascular disease was admitted today for elective left BKA. Patient is seen postoperatively in his hospital room. At the current time he denies chest pain or dyspnea but complains of severe stump pain.   Assessment/Plan: 1. SVT - cardiology has been consulted last night, to a certain on diltiazem drip. He is now in sinus rhythm and on higher dose of metop. Off dilt.  2. Status post left BKA-management per vascular surgery. PT evaluated patient and recommended CIR however there seems to be some problems with insurance. SNF with rehab likely.  3. Leukocytosis/fever/tachycardia - I appreciate with some cellulitic changes at the surgical site. Will will continue clindamycin by mouth for a total of 7 days. Fever and leukocytosis resolved.  4. Diabetes mellitus type 2-uncontrolled-last hemoglobin A1c in the system higher than 14. Patient placed back on his Lantus 50 units twice a day and will check CBGs 4 times a day with good controlled here.  5. Ischemic cardiomyopathy - stable, will continue to monitor  Code Status: Full Family Communication: none  Disposition Plan: SNF 3/5   Consultants:  PM&R  Cardiology  Procedures:  Left BKA  Antibiotics:  Clindamycin, started 06/07/2012.  HPI/Subjective: - complains of constipation this morning   Objective: Filed Vitals:   06/08/12 0344 06/08/12 0349 06/08/12 1932 06/09/12 0400  BP: 118/45  138/58 130/41  Pulse: 78  74 90  Temp: 98.7 F (37.1 C)  98.2 F (36.8 C) 98.4 F (36.9 C)  TempSrc: Oral  Oral Oral  Resp: 18  18 18   Height:      Weight:  132.8 kg (292 lb 12.3 oz)  133.7 kg (294 lb 12.1 oz)  SpO2: 100%  99% 100%    Intake/Output Summary (Last 24 hours) at 06/09/12 1127 Last data filed at 06/09/12 1059  Gross per 24 hour  Intake 1166.67 ml  Output   1925 ml  Net -758.33 ml   Filed Weights   06/07/12 0507 06/08/12 0349 06/09/12 0400  Weight: 131.09 kg (289 lb) 132.8 kg (292 lb 12.3 oz) 133.7 kg (294 lb 12.1 oz)    Exam 3/2:   General:  NAD  Cardiovascular: regular rate and rhythm, without MRG  Respiratory: good air movement, clear to auscultation throughout, no wheezing, ronchi or rales  Abdomen: soft, not tender to palpation, positive bowel sounds  MSK: no peripheral edema, s/p left BKA  Neuro: CN 2-12 grossly intact, MS 5/5 throughout  Data Reviewed: Basic Metabolic Panel:  Recent Labs Lab 06/04/12 0520 06/05/12 0650 06/07/12 0945 06/08/12 0728  NA 134* 135 134* 134*  K 4.1 3.6 4.0 3.7  CL 98 100 98 100  CO2 25 26 25 23   GLUCOSE 206* 147* 114* 99  BUN 14 13 13 15   CREATININE 1.08 1.06 1.05 1.12  CALCIUM 9.4 8.8 9.3 9.0  MG  --   --  2.2  --    Liver Function Tests: No results found for this basename: AST, ALT, ALKPHOS, BILITOT, PROT, ALBUMIN,  in the last 168 hours CBC:  Recent Labs Lab 06/04/12 0520 06/05/12 0650 06/06/12 0440 06/07/12 0945 06/08/12 0728  WBC 10.1 12.4* 11.8* 10.8* 9.5  HGB 14.9 12.7* 12.3* 12.6* 11.9*  HCT 42.2 36.3* 35.8* 36.3* 33.9*  MCV 86.7 86.2 87.5 87.1 86.5  PLT 171 171 168 167 200  CBG:  Recent Labs Lab 06/08/12 0616 06/08/12 1117 06/08/12 1629 06/08/12 2057 06/09/12 0548  GLUCAP 117* 135* 102* 85 82   Recent Results (from the past 240 hour(s))  SURGICAL PCR SCREEN     Status: Abnormal   Collection Time    06/02/12 11:28 AM      Result Value Range Status   MRSA, PCR NEGATIVE  NEGATIVE Final   Staphylococcus aureus POSITIVE (*) NEGATIVE Final   Comment:            The Xpert SA Assay (FDA     approved for NASAL specimens     in patients over 97 years of age),     is one component of     a comprehensive surveillance     program.  Test performance has     been validated by [employer] for patients  greater     than or equal to 14 year old.     It is not intended     to diagnose infection nor to     guide or monitor treatment.     Scheduled Meds: . aspirin  325 mg Oral Daily  . clindamycin  450 mg Oral Q6H  . colesevelam  1,875 mg Oral BID WC  . docusate sodium  100 mg Oral Daily  . enoxaparin (LOVENOX) injection  40 mg Subcutaneous Q24H  . insulin aspart  0-15 Units Subcutaneous TID WC  . insulin aspart  0-5 Units Subcutaneous QHS  . insulin glargine  50 Units Subcutaneous BID  . linagliptin  5 mg Oral Q breakfast  . metoprolol succinate  75 mg Oral Daily  . pantoprazole  40 mg Oral Daily  . polyethylene glycol  17 g Oral BID   Continuous Infusions:    Active Problems:   CAD   CARDIOMYOPATHY   ATRIAL FIBRILLATION, PAROXYSMAL   Type II or unspecified type diabetes mellitus with neurological manifestations, uncontrolled(250.62)   Atherosclerosis of native arteries of the extremities with gangrene   Critical lower limb ischemia   [name], MD [facility] Pager [phone]. If 7 PM - 7 AM, please contact night-coverage at [email or web address], password [facility] 06/09/2012, 11:27 AM  LOS: 6 days

## 2012-06-09 NOTE — Progress Notes (Signed)
[facility] [place]  Postoperative Visit - Amputation  Date of Surgery: 06/03/2012 Procedure(s): AMPUTATION BELOW KNEE Left Surgeon: Surgeon(s): [name], MD POD: 6 Days Post-Op  Subjective Leroy [name] is a 67 y.o. male who is S/P Left Procedure(s): AMPUTATION BELOW KNEE.  Pt.reports increased pain in the stump. The patient notes pain is well controlled. Pt. denies phantom pain.  Significant Diagnostic Studies: CBC Lab Results  Component Value Date   WBC 9.5 06/08/2012   HGB 11.9* 06/08/2012   HCT 33.9* 06/08/2012   MCV 86.5 06/08/2012   PLT 200 06/08/2012    BMET    Component Value Date/Time   NA 134* 06/08/2012 0728   K 3.7 06/08/2012 0728   CL 100 06/08/2012 0728   CO2 23 06/08/2012 0728   GLUCOSE 99 06/08/2012 0728   BUN 15 06/08/2012 0728   CREATININE 1.12 06/08/2012 0728   CALCIUM 9.0 06/08/2012 0728   GFRNONAA 67* 06/08/2012 0728   GFRAA 78* 06/08/2012 0728    COAG Lab Results  Component Value Date   INR 0.93 06/02/2012   INR 1.6 04/25/2010   INR 2.08* 03/25/2010   No results found for this basename: PTT     Intake/Output Summary (Last 24 hours) at 06/09/12 0743 Last data filed at 06/09/12 0446  Gross per 24 hour  Intake 1046.67 ml  Output   1425 ml  Net -378.33 ml   No data found.    Physical Examination  BP Readings from Last 3 Encounters:  06/09/12 130/41  06/09/12 130/41  06/02/12 88/54   Temp Readings from Last 3 Encounters:  06/09/12 98.4 F (36.9 C) Oral  06/09/12 98.4 F (36.9 C) Oral  06/02/12 98.3 F (36.8 C)    SpO2 Readings from Last 3 Encounters:  06/09/12 100%  06/09/12 100%  06/02/12 95%   Pulse Readings from Last 3 Encounters:  06/09/12 90  06/09/12 90  06/02/12 75    Pt is A&Ox3  WDWN male with no complaints  Left amputation wound is healing well.  There is good bone coverage in the stump Stump is warm and well perfused, without drainage; without erythema   Assessment/plan:  Leroy [name] is a 67  y.o. male who is s/p Left Procedure(s): AMPUTATION BELOW KNEE  The patient's stump is viable.  Follow-up 4 weeks from surgery  Shrike sock in place    [facility] 7:43 AM 06/09/[phone]

## 2012-06-10 LAB — BASIC METABOLIC PANEL
Calcium: 9 mg/dL (ref 8.4–10.5)
Creatinine, Ser: 1.04 mg/dL (ref 0.50–1.35)
GFR calc non Af Amer: 73 mL/min — ABNORMAL LOW (ref >90)
Sodium: 138 mEq/L (ref 135–145)

## 2012-06-10 LAB — GLUCOSE, CAPILLARY
Glucose-Capillary: 63 mg/dL — ABNORMAL LOW (ref 70–99)
Glucose-Capillary: 88 mg/dL (ref 70–99)
Glucose-Capillary: 131 mg/dL — ABNORMAL HIGH (ref 70–99)
Glucose-Capillary: 79 mg/dL (ref 70–99)

## 2012-06-10 MED ORDER — LISINOPRIL 2.5 MG PO TABS: 2.5000 mg | ORAL_TABLET | Freq: Every day | ORAL | Status: DC

## 2012-06-10 MED ORDER — ASPIRIN 81 MG PO TABS: 81.0000 mg | ORAL_TABLET | Freq: Every day | ORAL | Status: DC

## 2012-06-10 MED ORDER — METOPROLOL SUCCINATE ER 25 MG PO TB24: 75.0000 mg | ORAL_TABLET | Freq: Every day | ORAL | Status: DC

## 2012-06-10 MED ORDER — CLINDAMYCIN HCL 150 MG PO CAPS: 450.0000 mg | ORAL_CAPSULE | Freq: Four times a day (QID) | ORAL | Status: AC

## 2012-06-10 MED ORDER — BISACODYL 10 MG RE SUPP: 10.0000 mg | Freq: Once | RECTAL | Status: DC

## 2012-06-10 MED ORDER — OXYCODONE HCL 5 MG PO TABS: 5.0000 mg | ORAL_TABLET | ORAL | Status: DC | PRN

## 2012-06-10 MED ORDER — RIVAROXABAN 20 MG PO TABS: 20.0000 mg | ORAL_TABLET | Freq: Every day | ORAL | Status: DC

## 2012-06-10 MED ORDER — PANTOPRAZOLE SODIUM 40 MG PO TBEC: 40.0000 mg | DELAYED_RELEASE_TABLET | Freq: Every day | ORAL | Status: DC

## 2012-06-10 MED ORDER — INSULIN ASPART 100 UNIT/ML ~~LOC~~ SOLN: 5.0000 [IU] | Freq: Three times a day (TID) | SUBCUTANEOUS | Status: DC

## 2012-06-10 NOTE — Progress Notes (Signed)
Inpatient Diabetes Program Recommendations  AACE/ADA: New Consensus Statement on Inpatient Glycemic Control (2013)  Target Ranges:  Prepandial:   less than 140 mg/dL      Peak postprandial:   less than 180 mg/dL (1-2 hours)      Critically ill patients:  140 - 180 mg/dL    Note: Spoke with pt about diabetes.  Discussed A1C results (11.3%) with him,  basic pathophysiology of DM Type 2, basic home care, importance of checking CBGs and maintaining good CBG control to prevent long-term and short-term complications. Reviewed signs and symptoms of hyperglycemia and hypoglycemia along with proper treatment.  According to the patient he has not been taking Lantus very long, he has been on 70/30 and Novolin N in the past and states they did not work well for him.  He was initially having issues with financially affording Lantus but states that now he has adequate insurance coverage and he can now afford to get the Lantus without any problems.  Encouraged patient to check his blood sugars at least 4 times a day and to keep a record of his blood sugars and take them to the doctor with him when he follows up.  According to the discharge summary, Novolog 5 units with meals will be added to his outpatient regimen.  Patient states that he has been on a "sliding scale" in the past.  Informed the patient that Lantus can not be mixed with any other insulin and will need to be taken separately.  Also, reminded the patient that Novolog starts working quickly and will begin to drop his blood glucose quickly.  Also, reminded patient proper treatment for low blood sugar with "Rule of 15".  Patient verbalized understanding of conversation and states that he is going to do his best to get his blood glucose under control.  When asked if he had any additional questions or issues and he states that he has not further questions.    Thanks, Orlando Penner, RN, BSN, CCRN Diabetes Coordinator Inpatient Diabetes  Program [phone]

## 2012-06-10 NOTE — Progress Notes (Signed)
Physical Therapy Treatment Patient Details Name: ISSAC MOURE MRN: [id number] DOB: 1945/06/05 Today's Date: 06/10/2012 Time: 1308-6578 PT Time Calculation (min): 21 min  PT Assessment / Plan / Recommendation Comments on Treatment Session  Pt s/p left BKA with decr mobility secondary to continued need for cueing and assist for safety as well as decr balance and endurance. Will benefit from PT to address balance and endurance issues.      Follow Up Recommendations  SNF;Supervision/Assistance - 24 hour                 Equipment Recommendations  Rolling walker with 5" wheels        Frequency Min 3X/week   Plan Discharge plan remains appropriate;Frequency remains appropriate    Precautions / Restrictions Precautions Precautions: Fall Restrictions Weight Bearing Restrictions: Yes LLE Weight Bearing: Non weight bearing   Pertinent Vitals/Pain VSS, Some pain    Mobility  Bed Mobility Bed Mobility: Rolling Left;Left Sidelying to Sit;Sitting - Scoot to Edge of Bed Rolling Left: 4: Min guard;With rail Left Sidelying to Sit: 4: Min guard;With rails;HOB elevated Supine to Sit: Not tested (comment) Sitting - Scoot to Edge of Bed: 4: Min guard;With rail Sit to Supine: Not Tested (comment) Details for Bed Mobility Assistance: Verbal cues for technique.  Cues for hand placement as pt did not seem to recall previous instruction.   Transfers Transfers: Sit to Stand;Stand to [employer] Transfers Sit to Stand: 4: Min assist;With upper extremity assist;From bed;Other (comment) (+1 for safety) Stand to Sit: 4: Min assist;With upper extremity assist;To chair/3-in-1;With armrests Stand Pivot Transfers: 4: Min assist;Other (comment) (+1 for safety) Squat Pivot Transfers: Not tested (comment) Details for Transfer Assistance: Verbal and tactile cues for technique, hand placement and safety.  Required step by step instructions for pivoting to chair.   Ambulation/Gait Ambulation/Gait  Assistance: 4: Min assist;Other (comment) (+1 for safety and to follow with chair.) Ambulation Distance (Feet): 10 Feet Assistive device: Rolling walker Ambulation/Gait Assistance Details: Pt ambulated with RW with assist to steer RW.  Pt needed constant cues to not pick RW up as pt kept picking it up instead of rolling it.  Pt needed cues to stay close to RW.  Pt fatigued after 10 feet and brought chair up to patient.  Overall fair balance with RW. Gait Pattern: Step-through pattern Gait velocity: decreased  Stairs: No Wheelchair Mobility Wheelchair Mobility: No    Exercises Amputee Exercises Gluteal Sets: AROM;Both;10 reps;Seated Hip Flexion/Marching: AROM;Both;10 reps;Seated    PT Goals Acute Rehab PT Goals PT Goal: Rolling Supine to Left Side - Progress: Progressing toward goal PT Goal: Supine/Side to Sit - Progress: Progressing toward goal PT Goal: Sit to Stand - Progress: Progressing toward goal PT Goal: Stand to Sit - Progress: Progressing toward goal PT Transfer Goal: Bed to Chair/Chair to Bed - Progress: Progressing toward goal PT Goal: Stand - Progress: Met Pt will Ambulate: 16 - 50 feet;with supervision;with least restrictive assistive device PT Goal: Ambulate - Progress: Goal set today PT Goal: Perform Home Exercise Program - Progress: Progressing toward goal  Visit Information  Last PT Received On: 06/10/12 Assistance Needed: +2 PT/OT Co-Evaluation/Treatment: Yes    Subjective Data  Subjective: "I guess we can try."   Cognition  Cognition Overall Cognitive Status: Appears within functional limits for tasks assessed/performed Area of Impairment: Memory;Safety/judgement Arousal/Alertness: Awake/alert Orientation Level: Appears intact for tasks assessed Behavior During Session: Flat affect Current Attention Level: Focused Memory Deficits: Continues with decr recall of safe mobility techniques.  Following Commands: Follows multi-step commands  consistently Safety/Judgement: Decreased awareness of safety precautions;Decreased safety judgement for tasks assessed;Decreased awareness of need for assistance Awareness of Errors: Assistance required to identify errors made;Assistance required to correct errors made    Balance  Static Sitting Balance Static Sitting - Level of Assistance: Not tested (comment) Static Standing Balance Static Standing - Balance Support: Bilateral upper extremity supported;During functional activity Static Standing - Level of Assistance: 4: Min assist Static Standing - Comment/# of Minutes: Needed steadying assist initially.  Once pt began ambulating, postural stability improved.    End of Session PT - End of Session Equipment Utilized During Treatment: Gait belt Activity Tolerance: Patient tolerated treatment well;Patient limited by fatigue Patient left: in chair;with call bell/phone within reach Nurse Communication: Mobility status        INGOLD,DAWN 06/10/2012, 1:19 PM [facility] Acute Rehabilitation [phone] (pager)

## 2012-06-10 NOTE — Progress Notes (Addendum)
[facility]   Postoperative Visit - Amputation  Date of Surgery: 06/03/2012 Procedure(s): AMPUTATION BELOW KNEE Left Surgeon: Surgeon(s): [name], MD POD: 7 Days Post-Op  Subjective Leroy [name] is a 67 y.o. male who is S/P Left Procedure(s): AMPUTATION BELOW KNEE.  Pt.reports increased pain in the stump. The patient notes pain is well controlled. Pt. denies phantom pain.  Significant Diagnostic Studies: CBC Lab Results  Component Value Date   WBC 9.5 06/08/2012   HGB 11.9* 06/08/2012   HCT 33.9* 06/08/2012   MCV 86.5 06/08/2012   PLT 200 06/08/2012    BMET    Component Value Date/Time   NA 138 06/10/2012 0555   K 3.6 06/10/2012 0555   CL 102 06/10/2012 0555   CO2 22 06/10/2012 0555   GLUCOSE 67* 06/10/2012 0555   BUN 13 06/10/2012 0555   CREATININE 1.04 06/10/2012 0555   CALCIUM 9.0 06/10/2012 0555   GFRNONAA 73* 06/10/2012 0555   GFRAA 85* 06/10/2012 0555    COAG Lab Results  Component Value Date   INR 0.93 06/02/2012   INR 1.6 04/25/2010   INR 2.08* 03/25/2010   No results found for this basename: PTT     Intake/Output Summary (Last 24 hours) at 06/10/12 0753 Last data filed at 06/10/12 0600  Gross per 24 hour  Intake   1560 ml  Output   2100 ml  Net   -540 ml   Patient Vitals for the past 24 hrs:  Urine Occurrence Stool Color  06/09/12 1800 2 Brown  06/09/12 1700 2 Brown     Physical Examination  BP Readings from Last 3 Encounters:  06/10/12 115/59  06/10/12 115/59  06/02/12 88/54   Temp Readings from Last 3 Encounters:  06/10/12 98 F (36.7 C) Oral  06/10/12 98 F (36.7 C) Oral  06/02/12 98.3 F (36.8 C)    SpO2 Readings from Last 3 Encounters:  06/10/12 97%  06/10/12 97%  06/02/12 95%   Pulse Readings from Last 3 Encounters:  06/10/12 92  06/10/12 92  06/02/12 75    Pt is A&Ox3  WDWN male with no complaints  Left amputation wound is healing well.  There is good bone coverage in the stump Stump is warm and well  perfused, without drainage; without erythema   Assessment/plan:  Leroy [name] is a 67 y.o. male who is s/p Left Procedure(s): AMPUTATION BELOW KNEE  The patient's stump is viable.  Follow-up 4 weeks from surgery  Clinton Gallant [facility] 7:53 AM 06/10/[phone]  Addendum  I have independently interviewed and examined the patient, and I agree with the physician assistant's findings.  Follow up in the office in 4 weeks.  Ok to restart anti-coagulation if not already restarted.  [name], MD [facility] [place] Office: [phone] Pager: [phone]  06/10/2012, 4:08 PM

## 2012-06-10 NOTE — Discharge Summary (Signed)
Physician Discharge Summary  ELIYA GEIMAN MRN: [id number] DOB/AGE: Jul 29, 1945 67 y.o.  PCP: [name], MD   Admit date: 06/03/2012 Discharge date: 06/10/2012  Discharge Diagnoses:      S/P Left Procedure(s):  AMPUTATION BELOW KNEE AD   CARDIOMYOPATHY   ATRIAL FIBRILLATION, PAROXYSMAL   Type II or unspecified type diabetes mellitus with neurological manifestations, uncontrolled(250.62)   Atherosclerosis of native arteries of the extremities with gangrene   Critical lower limb ischemia     Medication List    TAKE these medications       aspirin 81 MG tablet  Take 1 tablet (81 mg total) by mouth daily.     clindamycin 150 MG capsule  Commonly known as:  CLEOCIN  Take 3 capsules (450 mg total) by mouth every 6 (six) hours.     colesevelam 625 MG tablet  Commonly known as:  WELCHOL  Take 3 tablets (1,875 mg total) by mouth 2 (two) times daily with a meal.     insulin glargine 100 UNIT/ML injection  Commonly known as:  LANTUS SOLOSTAR  Inject 50 Units into the skin 2 (two) times daily. Please dispense a 90 day supply with refills to complete the year     lisinopril 2.5 MG tablet  Commonly known as:  PRINIVIL,ZESTRIL  Take 1 tablet (2.5 mg total) by mouth daily.     metoprolol succinate 25 MG 24 hr tablet  Commonly known as:  TOPROL-XL  Take 3 tablets (75 mg total) by mouth daily.     nitroGLYCERIN 0.4 MG SL tablet  Commonly known as:  NITROSTAT  Place 1 tablet (0.4 mg total) under the tongue every 5 (five) minutes as needed.     pantoprazole 40 MG tablet  Commonly known as:  PROTONIX  Take 1 tablet (40 mg total) by mouth daily.     Rivaroxaban 20 MG Tabs  Commonly known as:  XARELTO  Take 1 tablet (20 mg total) by mouth daily.     Saxagliptin-Metformin 2.08-998 MG Tb24  Take 1 tablet by mouth 2 (two) times daily.       NovoLog 5 units before each meal    Discharge Condition: *Stable Disposition: 01-Home or Self Care   Consults:   Cardiology Vascular surgery  Significant Diagnostic Studies: Dg Chest 2 View  06/02/2012  *RADIOLOGY REPORT*  Clinical Data: Left below-knee amputation  CHEST - 2 VIEW  Comparison: 03/16/2010  Findings: Lungs are clear.  No focal consolidation. No pleural effusion or pneumothorax.  The heart is top normal in size.  Mild degenerative changes of the visualized thoracolumbar spine.  IMPRESSION: No evidence of acute cardiopulmonary disease.   Original Report Authenticated By: Charline Bills, M.D.    Ct Angio Chest Pe W/cm &/or Wo Cm  06/07/2012  *RADIOLOGY REPORT*  Clinical Data: Tachycardia, short of breath, history of blood clot  CT ANGIOGRAPHY CHEST  Technique:  Multidetector CT imaging of the chest using the standard protocol during bolus administration of intravenous contrast. Multiplanar reconstructed images including MIPs were obtained and reviewed to evaluate the vascular anatomy.  Contrast: 80mL OMNIPAQUE IOHEXOL 350 MG/ML SOLN  Comparison: None.  Findings: There are no filling defects within the pulmonary arteries to suggest acute pulmonary embolism. Within the right lower lobe pulmonary artery, there is a small linear wall adherent filling defect (image 137 through 140, series 7) which likely represents synechiae of a recannulated remote pulmonary embolism.  No acute findings of the aorta great vessels.  No pericardial fluid.  Esophagus is normal.  The subcarinal lymph node measuring 11 mm and is borderline by size criteria for pathology.  Review of the lung parenchyma demonstrates no pneumothorax or pleural fluid.  No pulmonary edema.  Limited view of the upper abdomen is unremarkable.  Limited view of the skeleton demonstrates midline sternotomy.  IMPRESSION:  1.  No evidence acute pulmonary embolism. 2.  Small linear filling defect within the right lower lobe pulmonary likely represents a synechiae of recannulized remote pulmonary embolism.   Original Report Authenticated By: Genevive Bi,  M.D.        Microbiology: Recent Results (from the past 240 hour(s))  SURGICAL PCR SCREEN     Status: Abnormal   Collection Time    06/02/12 11:28 AM      Result Value Range Status   MRSA, PCR NEGATIVE  NEGATIVE Final   Staphylococcus aureus POSITIVE (*) NEGATIVE Final   Comment:            The Xpert SA Assay (FDA     approved for NASAL specimens     in patients over 25 years of age),     is one component of     a comprehensive surveillance     program.  Test performance has     been validated by [employer] for patients greater     than or equal to 37 year old.     It is not intended     to diagnose infection nor to     guide or monitor treatment.     Labs: Results for orders placed during the hospital encounter of 06/03/12 (from the past 48 hour(s))  GLUCOSE, CAPILLARY     Status: Abnormal   Collection Time    06/08/12  4:29 PM      Result Value Range   Glucose-Capillary 102 (*) 70 - 99 mg/dL   Comment 1 Documented in Chart     Comment 2 Notify RN    GLUCOSE, CAPILLARY     Status: None   Collection Time    06/08/12  8:57 PM      Result Value Range   Glucose-Capillary 85  70 - 99 mg/dL  GLUCOSE, CAPILLARY     Status: None   Collection Time    06/09/12  5:48 AM      Result Value Range   Glucose-Capillary 82  70 - 99 mg/dL  GLUCOSE, CAPILLARY     Status: Abnormal   Collection Time    06/09/12 11:19 AM      Result Value Range   Glucose-Capillary 144 (*) 70 - 99 mg/dL   Comment 1 Notify RN    GLUCOSE, CAPILLARY     Status: None   Collection Time    06/09/12  4:18 PM      Result Value Range   Glucose-Capillary 80  70 - 99 mg/dL   Comment 1 Documented in Chart     Comment 2 Notify RN    GLUCOSE, CAPILLARY     Status: Abnormal   Collection Time    06/09/12  8:51 PM      Result Value Range   Glucose-Capillary 122 (*) 70 - 99 mg/dL   Comment 1 Documented in Chart     Comment 2 Notify RN    BASIC METABOLIC PANEL     Status: Abnormal   Collection Time     06/10/12  5:55 AM      Result Value Range  Sodium 138  135 - 145 mEq/L   Potassium 3.6  3.5 - 5.1 mEq/L   Chloride 102  96 - 112 mEq/L   CO2 22  19 - 32 mEq/L   Glucose, Bld 67 (*) 70 - 99 mg/dL   BUN 13  6 - 23 mg/dL   Creatinine, Ser 1.61  0.50 - 1.35 mg/dL   Calcium 9.0  8.4 - 09.6 mg/dL   GFR calc non Af Amer 73 (*) >90 mL/min   GFR calc Af Amer 85 (*) >90 mL/min   Comment:            The eGFR has been calculated     using the CKD EPI equation.     This calculation has not been     validated in all clinical     situations.     eGFR's persistently     <90 mL/min signify     possible Chronic Kidney Disease.  GLUCOSE, CAPILLARY     Status: Abnormal   Collection Time    06/10/12  5:58 AM      Result Value Range   Glucose-Capillary 63 (*) 70 - 99 mg/dL  GLUCOSE, CAPILLARY     Status: None   Collection Time    06/10/12  6:22 AM      Result Value Range   Glucose-Capillary 88  70 - 99 mg/dL  GLUCOSE, CAPILLARY     Status: Abnormal   Collection Time    06/10/12 11:49 AM      Result Value Range   Glucose-Capillary 131 (*) 70 - 99 mg/dL   Comment 1 Documented in Chart     Comment 2 Notify RN       HPI :*67 y/o male who is admitted for left BKA, cardiology consulted for evaluation of SVT.  He has a history of CAD s/p CABG in 2001 at [facility] (records not available). 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 He was noted to be tachycardic on telemetry. EKG showed SVT with heart rate 157 bpm and patient was asymptomatic and hemodynamically stable. He received Metoprolol 5 mg iv twice before cardiology consultation.  TTE from 05/12/2012 showed LVEF 30-35%.   HOSPITAL COURSE: *  Hospital Course:  1. SVT - on 06/08/2012, patient has been increasingly tachycardic with heart rates into the 140s to 180s. Telemetry showed SVT. Attempts were made to slow the heart rate down with IV metoprolol, however patient needed to be started on a  Cardizem drip and cardiology was consulted. Of concern of recent surgery, and immobility, the CTPA was done to exclude a pulmonary embolus. The CTPA was negative. He converted back to sinus rhythm off of the Cardizem tree, and per cardiology recommendation, his metoprolol dose was increased to 75 mg daily. Patient remained in sinus rhythm . A cardiology recommendations the patient has been initiated on xarelto. He will enclose monitoring of his CBC every 2 weeks. Discussed with surgery Dr. Imogene Burn, okay to resume anticoagulation 2. Status post left BKA-management per vascular surgery. PT evaluated patient and recommended skilled nursing facility. 3. Leukocytosis/fever/tachycardia - on 06/08/2012, I appreciated with some cellulitic changes at the surgical site. Patient was empirically started on clindamycin with the plan to do a 7 day course. Following antibiotic initiation, he was no longer febrile, and his leukocytosis resolved. 4. Diabetes mellitus type 2-uncontrolled-last hemoglobin A1c in the system higher than 14. Patient placed back on his Lantus 50 units  twice a day and will check CBGs 4 times a day with good controlled here. We'll also start NovoLog 5 units before each meal 5.    Discharge Exam:  Blood pressure 115/59, pulse 92, temperature 98 F (36.7 C), temperature source Oral, resp. rate 18, height 6\' 9"  (2.057 m), weight 131.1 kg (289 lb 0.4 oz), SpO2 97.00%.  General: NAD  Cardiovascular: regular rate and rhythm, without MRG  Respiratory: good air movement, clear to auscultation throughout, no wheezing, ronchi or rales  Abdomen: soft, not tender to palpation, positive bowel sounds  MSK: no peripheral edema, s/p left BKA  Neuro: CN 2-12 grossly intact, MS 5/5 throughout           Future Appointments Provider Department Dept Phone   07/03/2012 9:45 AM [name], MD [facility] [phone]      Follow-up Information   Follow up with Nilda Simmer, MD In 4 weeks. (sent)    Contact information:   [address] [place] [address] [phone]       Signed: Richarda Overlie 06/10/2012, 12:38 PM

## 2012-06-10 NOTE — Clinical Social Work Note (Signed)
CSW was notified by the SNF that Pt had selected that they are not in-network for his insurance.  CSW advised Pt on another option, but Pt was unsure and wanted to talk to his daughter.  Pt upset over going to a SNF that he is not familiar with. CSW spoke with Pt's daughter and discussed challenges. CSW agreed to call area SNFs again for possible availability. Pt and Pt's family aware that Pt has been cleared for dc.   Frederico Hamman, LCSW [phone]

## 2012-06-10 NOTE — Progress Notes (Signed)
K. SchorrNP called back and updated about pts. condition.

## 2012-06-10 NOTE — Progress Notes (Signed)
Occupational Therapy Treatment Patient Details Name: Leroy [name] MRN: [id number] DOB: 22-Aug-1945 Today's Date: 06/10/2012 Time: 4098-1191 OT Time Calculation (min): 21 min  OT Assessment / Plan / Recommendation Comments on Treatment Session Pt making good progress with functional mobility.    Follow Up Recommendations  SNF    Barriers to Discharge       Equipment Recommendations  Tub/shower bench;3 in 1 bedside comode    Recommendations for Other Services    Frequency Min 2X/week   Plan Discharge plan needs to be updated    Precautions / Restrictions Precautions Precautions: Fall Restrictions Weight Bearing Restrictions: Yes LLE Weight Bearing: Non weight bearing   Pertinent Vitals/Pain See vitals    ADL  Upper Body Dressing: Performed;Set up Where Assessed - Upper Body Dressing: Unsupported sitting Toilet Transfer: Simulated;Minimal assistance Toilet Transfer Method: Stand pivot Toilet Transfer Equipment:  (bed to chair) Equipment Used: Gait belt;Rolling walker Transfers/Ambulation Related to ADLs: Min assist for sit<>stand and with RW for ambulation in room (~15 ft). ADL Comments: Increased time to process therapist cues during mobility/transfers.    OT Diagnosis:    OT Problem List:   OT Treatment Interventions:     OT Goals ADL Goals Pt Will Transfer to Toilet: with supervision;Ambulation;3-in-1 ADL Goal: Toilet Transfer - Progress: Updated due to goal met Miscellaneous OT Goals Miscellaneous OT Goal #1: Pt will transfer supine to sit EOB with supervision in preparation for selfcare tasks. OT Goal: Miscellaneous Goal #1 - Progress: Met  Visit Information  Last OT Received On: 06/10/12 Assistance Needed: +2 PT/OT Co-Evaluation/Treatment: Yes    Subjective Data      Prior Functioning       Cognition  Cognition Overall Cognitive Status: Appears within functional limits for tasks assessed/performed Area of Impairment:  Memory;Safety/judgement Arousal/Alertness: Awake/alert Orientation Level: Appears intact for tasks assessed Behavior During Session: Flat affect Current Attention Level: Focused Memory Deficits: Continues with decr recall of safe mobility techniques.   Following Commands: Follows multi-step commands consistently Safety/Judgement: Decreased awareness of safety precautions;Decreased safety judgement for tasks assessed;Decreased awareness of need for assistance Awareness of Errors: Assistance required to identify errors made;Assistance required to correct errors made    Mobility  Bed Mobility Bed Mobility: Rolling Left;Left Sidelying to Sit;Sitting - Scoot to Edge of Bed Rolling Left: 4: Min guard;With rail Left Sidelying to Sit: 4: Min guard;With rails;HOB elevated Supine to Sit: Not tested (comment) Sitting - Scoot to Edge of Bed: 4: Min guard;With rail Sit to Supine: Not Tested (comment) Details for Bed Mobility Assistance: Verbal cues for technique.  Cues for hand placement as pt did not seem to recall previous instruction.   Transfers Transfers: Sit to Stand;Stand to Sit Sit to Stand: 4: Min assist;With upper extremity assist;From bed;Other (comment) (+1 for safety) Stand to Sit: 4: Min assist;With upper extremity assist;To chair/3-in-1;With armrests Details for Transfer Assistance: Verbal and tactile cues for technique, hand placement and safety.  Required step by step instructions for pivoting to chair.      Exercises  Amputee Exercises Gluteal Sets: AROM;Both;10 reps;Seated Hip Flexion/Marching: AROM;Both;10 reps;Seated   Balance Static Sitting Balance Static Sitting - Level of Assistance: Not tested (comment) Static Standing Balance Static Standing - Balance Support: Bilateral upper extremity supported;During functional activity Static Standing - Level of Assistance: 4: Min assist Static Standing - Comment/# of Minutes: Needed steadying assist initially.  Once pt began  ambulating, postural stability improved.     End of Session OT - End of Session Equipment Utilized  During Treatment: Gait belt Activity Tolerance: Patient tolerated treatment well Patient left: in chair;with call bell/phone within reach Nurse Communication: Mobility status  GO    06/10/2012 Cipriano Mile OTR/L Pager [phone] Office [phone]  Cipriano Mile 06/10/2012, 1:23 PM

## 2012-06-10 NOTE — Progress Notes (Signed)
Pts. blood sugar this am was 63, symptomatic-diaphoretic and felt weak. Given juice and snacks and reckecked after 15 mins blood sugar up to 88. Pt stated feeling a little better. K SchorrNP was paged and text pts. BS and symptoms. Will cont to monitor.

## 2012-06-11 LAB — GLUCOSE, CAPILLARY: Glucose-Capillary: 60 mg/dL — ABNORMAL LOW (ref 70–99)

## 2012-06-11 MED ORDER — BISACODYL 10 MG RE SUPP: 10.0000 mg | Freq: Once | RECTAL | Status: DC

## 2012-06-11 NOTE — Progress Notes (Signed)
Inpatient Diabetes Program Recommendations  AACE/ADA: New Consensus Statement on Inpatient Glycemic Control (2013)  Target Ranges:  Prepandial:   less than 140 mg/dL      Peak postprandial:   less than 180 mg/dL (1-2 hours)      Critically ill patients:  140 - 180 mg/dL   Patient with HYPOglycemia last two mornings.  CBGs: 63,60 Inpatient Diabetes Program Recommendations Insulin - Basal: Decrease Lantus dose  Only one dose of Lantus was given yesterday.   Thank you  Piedad Climes BSN, RN,CDE Inpatient Diabetes Coordinator [phone] (team pager)

## 2012-06-11 NOTE — Progress Notes (Signed)
Pt noted to be going home today. LM on our office scheduling voicemail to call patient with f/u appt and also to be plugged into anti-coag clinic for education visit. Dayna Dunn PA-C

## 2012-06-11 NOTE — Progress Notes (Signed)
Pt discharge instructions and patient education completed. IV site d/c. Site WNL. Belongings with [facility] to be transferred with patient to [facility]. Dion Saucier

## 2012-06-11 NOTE — Discharge Summary (Signed)
Physician Discharge Summary  MAXIMILLION GILL  MRN: [id number]  DOB/AGE: January 28, 1946 67 y.o.  PCP: [name], MD  Admit date: 06/03/2012  Discharge date: 06/11/2012      Discharge Diagnoses:  S/P Left Procedure(s):  AMPUTATION BELOW KNEE  AD  CARDIOMYOPATHY  ATRIAL FIBRILLATION, PAROXYSMAL  Type II or unspecified type diabetes mellitus with neurological manifestations, uncontrolled(250.62)  Atherosclerosis of native arteries of the extremities with gangrene  Critical lower limb ischemia      Medication List     TAKE these medications       aspirin 81 MG tablet    Take 1 tablet (81 mg total) by mouth daily.    clindamycin 150 MG capsule    Commonly known as: CLEOCIN    Take 3 capsules (450 mg total) by mouth every 6 (six) hours.    colesevelam 625 MG tablet    Commonly known as: WELCHOL    Take 3 tablets (1,875 mg total) by mouth 2 (two) times daily with a meal.    insulin glargine 100 UNIT/ML injection    Commonly known as: LANTUS SOLOSTAR    Inject 50 Units into the skin 2 (two) times daily. Please dispense a 90 day supply with refills to complete the year    lisinopril 2.5 MG tablet    Commonly known as: PRINIVIL,ZESTRIL    Take 1 tablet (2.5 mg total) by mouth daily.    metoprolol succinate 25 MG 24 hr tablet    Commonly known as: TOPROL-XL    Take 3 tablets (75 mg total) by mouth daily.    nitroGLYCERIN 0.4 MG SL tablet    Commonly known as: NITROSTAT    Place 1 tablet (0.4 mg total) under the tongue every 5 (five) minutes as needed.    pantoprazole 40 MG tablet    Commonly known as: PROTONIX    Take 1 tablet (40 mg total) by mouth daily.    Rivaroxaban 20 MG Tabs    Commonly known as: XARELTO    Take 1 tablet (20 mg total) by mouth daily.           NovoLog 5 units before each meal    Discharge Condition: *Stable  Disposition: 01-Home or Self Care  Consults:  Cardiology  Vascular surgery      Significant Diagnostic Studies:  Dg Chest 2 View   06/02/2012 *RADIOLOGY REPORT* Clinical Data: Left below-knee amputation CHEST - 2 VIEW Comparison: 03/16/2010 Findings: Lungs are clear. No focal consolidation. No pleural effusion or pneumothorax. The heart is top normal in size. Mild degenerative changes of the visualized thoracolumbar spine. IMPRESSION: No evidence of acute cardiopulmonary disease. Original Report Authenticated By: Charline Bills, M.D.  Ct Angio Chest Pe W/cm &/or Wo Cm  06/07/2012 *RADIOLOGY REPORT* Clinical Data: Tachycardia, short of breath, history of blood clot CT ANGIOGRAPHY CHEST Technique: Multidetector CT imaging of the chest using the standard protocol during bolus administration of intravenous contrast. Multiplanar reconstructed images including MIPs were obtained and reviewed to evaluate the vascular anatomy. Contrast: 80mL OMNIPAQUE IOHEXOL 350 MG/ML SOLN Comparison: None. Findings: There are no filling defects within the pulmonary arteries to suggest acute pulmonary embolism. Within the right lower lobe pulmonary artery, there is a small linear wall adherent filling defect (image 137 through 140, series 7) which likely represents synechiae of a recannulated remote pulmonary embolism. No acute findings of the aorta great vessels. No pericardial fluid. Esophagus is normal. The subcarinal lymph node measuring 11 mm and is borderline by size  criteria for pathology. Review of the lung parenchyma demonstrates no pneumothorax or pleural fluid. No pulmonary edema. Limited view of the upper abdomen is unremarkable. Limited view of the skeleton demonstrates midline sternotomy. IMPRESSION: 1. No evidence acute pulmonary embolism. 2. Small linear filling defect within the right lower lobe pulmonary likely represents a synechiae of recannulized remote pulmonary embolism. Original Report Authenticated By: Genevive Bi, M.D.  Microbiology:  Recent Results (from the past 240 hour(s))   SURGICAL PCR SCREEN Status: Abnormal    Collection  Time    06/02/12 11:28 AM   Result  Value  Range  Status    MRSA, PCR  NEGATIVE  NEGATIVE  Final    Staphylococcus aureus  POSITIVE (*)  NEGATIVE  Final    Comment:      The Xpert SA Assay (FDA     approved for NASAL specimens     in patients over 42 years of age),     is one component of     a comprehensive surveillance     program. Test performance has     been validated by [employer] for patients greater     than or equal to 1 year old.     It is not intended     to diagnose infection nor to     guide or monitor treatment.    Labs:  Results for orders placed during the hospital encounter of 06/03/12 (from the past 48 hour(s))   GLUCOSE, CAPILLARY Status: Abnormal    Collection Time    06/08/12 4:29 PM   Result  Value  Range    Glucose-Capillary  102 (*)  70 - 99 mg/dL    Comment 1  Documented in Chart     Comment 2  Notify RN    GLUCOSE, CAPILLARY Status: None    Collection Time    06/08/12 8:57 PM   Result  Value  Range    Glucose-Capillary  85  70 - 99 mg/dL   GLUCOSE, CAPILLARY Status: None    Collection Time    06/09/12 5:48 AM   Result  Value  Range    Glucose-Capillary  82  70 - 99 mg/dL   GLUCOSE, CAPILLARY Status: Abnormal    Collection Time    06/09/12 11:19 AM   Result  Value  Range    Glucose-Capillary  144 (*)  70 - 99 mg/dL    Comment 1  Notify RN    GLUCOSE, CAPILLARY Status: None    Collection Time    06/09/12 4:18 PM   Result  Value  Range    Glucose-Capillary  80  70 - 99 mg/dL    Comment 1  Documented in Chart     Comment 2  Notify RN    GLUCOSE, CAPILLARY Status: Abnormal    Collection Time    06/09/12 8:51 PM   Result  Value  Range    Glucose-Capillary  122 (*)  70 - 99 mg/dL    Comment 1  Documented in Chart     Comment 2  Notify RN    BASIC METABOLIC PANEL Status: Abnormal    Collection Time    06/10/12 5:55 AM   Result  Value  Range    Sodium  138  135 - 145 mEq/L    Potassium  3.6  3.5 - 5.1 mEq/L    Chloride  102  96 -  112 mEq/L    CO2  22  19 - 32 mEq/L    Glucose, Bld  67 (*)  70 - 99 mg/dL    BUN  13  6 - 23 mg/dL    Creatinine, Ser  3.08  0.50 - 1.35 mg/dL    Calcium  9.0  8.4 - 10.5 mg/dL    GFR calc non Af Amer  73 (*)  >90 mL/min    GFR calc Af Amer  85 (*)  >90 mL/min    Comment:      The eGFR has been calculated     using the CKD EPI equation.     This calculation has not been     validated in all clinical     situations.     eGFR's persistently     <90 mL/min signify     possible Chronic Kidney Disease.   GLUCOSE, CAPILLARY Status: Abnormal    Collection Time    06/10/12 5:58 AM   Result  Value  Range    Glucose-Capillary  63 (*)  70 - 99 mg/dL   GLUCOSE, CAPILLARY Status: None    Collection Time    06/10/12 6:22 AM   Result  Value  Range    Glucose-Capillary  88  70 - 99 mg/dL   GLUCOSE, CAPILLARY Status: Abnormal    Collection Time    06/10/12 11:49 AM   Result  Value  Range    Glucose-Capillary  131 (*)  70 - 99 mg/dL    Comment 1  Documented in Chart     Comment 2  Notify RN       HPI :*67 y/o male who is admitted for left BKA, cardiology consulted for evaluation of SVT. He has a history of CAD s/p CABG in 2001 at [facility] [place] (records not available). 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 He was noted to be tachycardic on telemetry. EKG showed SVT with heart rate 157 bpm and patient was asymptomatic and hemodynamically stable. He received Metoprolol 5 mg iv twice before cardiology consultation.  TTE from 05/12/2012 showed LVEF 30-35%.    HOSPITAL COURSE: *  Hospital Course:  1. SVT - on 06/08/2012, patient was found to be increasingly tachycardic with heart rates into the 140s to 180s. Telemetry showed SVT. Attempts were made to slow the heart rate down with IV metoprolol, however patient needed to be started on a Cardizem drip and cardiology was consulted. Of concern of recent surgery, and immobility, the CTPA was done to exclude  a pulmonary embolus. The CTPA was negative. He converted back to sinus rhythm off of the Cardizem tree, and per cardiology recommendation, his metoprolol dose was increased to 75 mg daily. Patient remained in sinus rhythm . A cardiology recommendations the patient has been initiated on xarelto. He will enclose monitoring of his CBC every 2 weeks. Discussed with vascular surgery Dr. Imogene Burn, okay to resume anticoagulation 2. Status post left BKA-management per vascular surgery. PT evaluated patient and recommended skilled nursing facility. 3. Leukocytosis/fever/tachycardia - on 06/08/2012, I appreciated with some cellulitic changes at the surgical site. Patient was empirically started on clindamycin with the plan to do a 7 day course. Following antibiotic initiation, he was no longer febrile, and his leukocytosis resolved.   4. Diabetes mellitus type 2- hemoglobin A1c of 11.3 Patient placed back on his Lantus 50 units twice a day and will check CBGs 4 times a day with good controlled here. We'll also start NovoLog  5 units before each meal. Discontinue oral hypoglycemics   Discharge Exam:  Blood pressure 115/59, pulse 92, temperature 98 F (36.7 C), temperature source Oral, resp. rate 18, height 6\' 9"  (2.057 m), weight 131.1 kg (289 lb 0.4 oz), SpO2 97.00%.  General: NAD  Cardiovascular: regular rate and rhythm, without MRG  Respiratory: good air movement, clear to auscultation throughout, no wheezing, ronchi or rales  Abdomen: soft, not tender to palpation, positive bowel sounds  MSK: no peripheral edema, s/p left BKA  Neuro: CN 2-12 grossly intact, MS 5/5 throughout        Future Appointments  Provider  Department  Dept Phone    07/03/2012 9:45 AM  [name], MD  [facility]  [phone]      Follow-up Information    Follow up with Nilda Simmer, MD In 4 weeks. (sent)    Contact information:    [address]  [place] [address]  [phone]

## 2012-06-11 NOTE — Progress Notes (Addendum)
[facility]   Postoperative Visit - Amputation  Date of Surgery: 06/03/2012 Procedure(s): AMPUTATION BELOW KNEE Left Surgeon: Surgeon(s): [name], MD POD: 8 Days Post-Op  Subjective Leroy [name] is a 67 y.o. male who is S/P Left Procedure(s): AMPUTATION BELOW KNEE.  Pt.denies increased pain in the stump. The patient notes pain is well controlled. Pt. denies phantom pain. Asked to look at stump as pt concerned about infection. No drainage or redness noted.  Significant Diagnostic Studies: CBC Lab Results  Component Value Date   WBC 9.5 06/08/2012   HGB 11.9* 06/08/2012   HCT 33.9* 06/08/2012   MCV 86.5 06/08/2012   PLT 200 06/08/2012    BMET    Component Value Date/Time   NA 138 06/10/2012 0555   K 3.6 06/10/2012 0555   CL 102 06/10/2012 0555   CO2 22 06/10/2012 0555   GLUCOSE 67* 06/10/2012 0555   BUN 13 06/10/2012 0555   CREATININE 1.04 06/10/2012 0555   CALCIUM 9.0 06/10/2012 0555   GFRNONAA 73* 06/10/2012 0555   GFRAA 85* 06/10/2012 0555    COAG Lab Results  Component Value Date   INR 0.93 06/02/2012   INR 1.6 04/25/2010   INR 2.08* 03/25/2010   No results found for this basename: PTT     Intake/Output Summary (Last 24 hours) at 06/11/12 1154 Last data filed at 06/11/12 1100  Gross per 24 hour  Intake   1440 ml  Output   2050 ml  Net   -610 ml   No data found.    Physical Examination  BP Readings from Last 3 Encounters:  06/11/12 138/59  06/11/12 138/59  06/02/12 88/54   Temp Readings from Last 3 Encounters:  06/11/12 98.3 F (36.8 C) Oral  06/11/12 98.3 F (36.8 C) Oral  06/02/12 98.3 F (36.8 C)    SpO2 Readings from Last 3 Encounters:  06/11/12 98%  06/11/12 98%  06/02/12 95%   Pulse Readings from Last 3 Encounters:  06/11/12 84  06/11/12 84  06/02/12 75    Pt is A&Ox3  WDWN male with no complaints  Left amputation wound is healing well.  There is good bone coverage in the stump Stump is warm and well perfused,  without drainage; without erythema Washed betadine and old blood off stump wound healing very well with no signs of infection   Assessment/plan:  Leroy [name] is a 67 y.o. male who is s/p Left  AMPUTATION BELOW KNEE  The patient's stump is viable.  Follow-up 4 weeks from surgery  [place] 11:54 AM 06/11/[phone]  Addendum  I have independently interviewed and examined the patient, and I agree with the physician assistant's findings.  Follow up in the office in 4 weeks for staple removal.  Keep stump elevated and continue stump shrinker usage to help with swelling.  Patient baseline had some chronic venous insufficiency.    [name], MD [facility] [place] Office: [phone] Pager: [phone]  06/11/2012, 12:44 PM

## 2012-06-23 NOTE — Clinical Social Work Placement (Signed)
Late entry 3/18 or 06/11/12  Clinical Social Work Department CLINICAL SOCIAL WORK PLACEMENT NOTE 06/23/2012  Patient:  Leroy [name], Leroy [name]  Account Number:  [id number] Admit date:  06/03/2012  Clinical Social Worker:  Frederico Hamman, LCSW  Date/time:  06/05/2012 12:00 M  Clinical Social Work is seeking post-discharge placement for this patient at the following level of care:   SKILLED NURSING   (*CSW will update this form in Epic as items are completed)   06/05/2012  Patient/family provided with [facility] Department of Clinical Social Work's list of facilities offering this level of care within the geographic area requested by the patient (or if unable, by the patient's family).  06/05/2012  Patient/family informed of their freedom to choose among providers that offer the needed level of care, that participate in Medicare, Medicaid or managed care program needed by the patient, have an available bed and are willing to accept the patient.  06/05/2012  Patient/family informed of [facility]' ownership interest in [facility], as well as of the fact that they are under no obligation to receive care at this facility.  PASARR submitted to EDS on 06/10/2012 PASARR number received from EDS on 06/10/2012  FL2 transmitted to all facilities in geographic area requested by pt/family on  06/05/2012 FL2 transmitted to all facilities within larger geographic area on   Patient informed that his/her managed care company has contracts with or will negotiate with  certain facilities, including the following:     Patient/family informed of bed offers received:  06/06/2012 Patient chooses bed at [facility] Physician recommends and patient chooses bed at    Patient to be transferred to [facility] on  06/11/2012 Patient to be transferred to facility by ptar  The following physician request were entered in Epic:   Additional Comments: CSW sent SNF DC info. Pt's  family meeting Pt at SNF. Non-emergency transport arranged. No further CSW needs at this time.  Frederico Hamman, LCSW [phone]

## 2012-06-29 ENCOUNTER — Non-Acute Institutional Stay (SKILLED_NURSING_FACILITY): Payer: Medicare HMO | Admitting: Internal Medicine

## 2012-06-29 DIAGNOSIS — I4891 Unspecified atrial fibrillation: Secondary | ICD-10-CM

## 2012-06-29 DIAGNOSIS — I96 Gangrene, not elsewhere classified: Secondary | ICD-10-CM

## 2012-06-29 DIAGNOSIS — I255 Ischemic cardiomyopathy: Secondary | ICD-10-CM

## 2012-06-29 DIAGNOSIS — I2589 Other forms of chronic ischemic heart disease: Secondary | ICD-10-CM

## 2012-06-29 DIAGNOSIS — E1059 Type 1 diabetes mellitus with other circulatory complications: Secondary | ICD-10-CM

## 2012-07-02 ENCOUNTER — Encounter: Payer: Self-pay | Admitting: Vascular Surgery

## 2012-07-02 NOTE — Progress Notes (Signed)
Patient ID: Leroy [name], male   DOB: 1945-08-26, 67 y.o.   MRN: [id number]           PROGRESS NOTE  DATE:  06/29/2012  FACILITY: [facility]   LEVEL OF CARE: SNF  Discharge Note  CHIEF COMPLAINT:  Manage left lower extremity gangrene.    HISTORY OF PRESENT ILLNESS: I was requested by the social worker to perform face-to-face evaluation for home health services.    Patient was having left lower extremity gangrene and underwent left BKA.  After hospitalization, he was admitted to this facility for wound care and short-term rehabilitation.  Therapy has cleared him for discharge on 07/01/2012.  Patient denies any acute problems.    PAST MEDICAL HISTORY : Reviewed.  No changes.  CURRENT MEDICATIONS: Reviewed per [facility]  REVIEW OF SYSTEMS: GENERAL: no change in appetite, no fatigue, no weight changes, no fever, chills or weakness RESPIRATORY: no cough, SOB, DOE,, wheezing, hemoptysis CARDIAC: no chest pain, edema or palpitations GI: no abdominal pain, diarrhea, constipation, heart burn, nausea or vomiting  PHYSICAL EXAMINATION  VS:  Not recorded. GENERAL: morbidly obese, no acute distress EYES: conjunctivae normal, sclerae normal, normal eye lids NECK: supple, trachea midline, no neck masses, no thyroid tenderness, no thyromegaly LYMPHATICS: no LAN in the neck, no supraclavicular LAN RESPIRATORY: breathing is even & unlabored, BS CTAB CARDIAC: RRR, no murmur,no extra heart sounds EDEMA/VARICOSITIES:    Right lower extremity has +2 edema.   ARTERIAL:   Pedal pulses nonpalpable.  He is status post left BKA.   GI: abdomen soft, normal BS, no masses, no tenderness, no hepatomegaly, no splenomegaly PSYCHIATRIC: the patient is alert & oriented to person, affect & behavior appropriate  LABS/RADIOLOGY: On 06/22/2012:  CBC normal.    Glucose 141, otherwise BMP normal.  ASSESSMENT/PLAN:  Left lower extremity gangrene (            ).  Status post left BKA.  Patient will need a  wheelchair with cushion and a standard walker.  DME 897.0.    Diabetes mellitus with vascular complications (            ).  Continue current Lantus dose.    Atrial fibrillation 427.31.  Rate controlled.    Cardiomyopathy (            ).  Well compensated.    GERD 530.81.  Well controlled.   Hyperlipidemia 272.0.  Continue Welchol and follow up with primary MD.  I have filled out patient's discharge paperwork and written prescriptions.  He will be discharged on 07/01/2012 with home health PT and OT.    Total discharge time:  Greater than 30 minutes.     CPT CODE: 30865.

## 2012-07-03 ENCOUNTER — Ambulatory Visit (INDEPENDENT_AMBULATORY_CARE_PROVIDER_SITE_OTHER): Payer: Medicare HMO | Admitting: Vascular Surgery

## 2012-07-03 ENCOUNTER — Encounter: Payer: Self-pay | Admitting: Vascular Surgery

## 2012-07-03 VITALS — BP 101/43 | HR 79 | Temp 98.2°F | Ht 69.0 in | Wt 292.0 lb

## 2012-07-03 DIAGNOSIS — I70269 Atherosclerosis of native arteries of extremities with gangrene, unspecified extremity: Secondary | ICD-10-CM

## 2012-07-03 NOTE — Addendum Note (Signed)
Addended by: Adria Dill L on: 07/03/2012 02:46 PM   Modules accepted: Orders

## 2012-07-03 NOTE — Progress Notes (Signed)
[facility] [place]  Postoperative Visit  History of Present Illness  Leroy [name] is a 67 y.o. male who presents for postoperative follow-up for: left below-the-knee amputation (Date: 06/03/12).  The patient's wounds are healed.  The patient notes pain is well controlled.  The patient's current symptoms are: none.  Physical Examination  Filed Vitals:   07/03/12 1007  BP: 101/43  Pulse: 79  Temp: 98.2 F (36.8 C)   LLE: Incision is healed.  Staples are intact and were removed  Medical Decision Making  Leroy [name] is a 67 y.o. male who presents s/p left below-the-knee ampuation, RLE PAD  The patient's stump is healing appropriately with resolution of pre-operative symptoms. I discussed in depth with the patient the nature of atherosclerosis, and emphasized the importance of maximal medical management including strict control of blood pressure, blood glucose, and lipid levels, obtaining regular exercise, and cessation of smoking.  The patient is aware that without maximal medical management the underlying atherosclerotic disease process will progress, possibly leading to a more proximal amputation. The patient agrees to participate in their maximal medical care.  Thank you for allowing [place] to participate in this patient's care.  The patient has been referred for prosthetic fitting.  In regards to the RLE PAD, I would continue with surveillance in 3 months with a R ABI.  He has known disease in this artery.    Currently, he is not sx likely due to his non-ambulatory status with the BKA.    Once his ambulation improves with the prosthetic, he may become sx in this right foot.    The patient follow up in 3 months.    [name], MD [facility] [place] Office: [phone] Pager: [phone]

## 2012-07-16 DIAGNOSIS — I739 Peripheral vascular disease, unspecified: Secondary | ICD-10-CM

## 2012-07-16 DIAGNOSIS — Z5189 Encounter for other specified aftercare: Secondary | ICD-10-CM

## 2012-07-16 DIAGNOSIS — R269 Unspecified abnormalities of gait and mobility: Secondary | ICD-10-CM

## 2012-07-16 DIAGNOSIS — Z4789 Encounter for other orthopedic aftercare: Secondary | ICD-10-CM

## 2012-07-27 ENCOUNTER — Ambulatory Visit: Payer: Medicare Other | Admitting: Cardiology

## 2012-08-03 ENCOUNTER — Telehealth: Payer: Self-pay | Admitting: *Deleted

## 2012-08-03 NOTE — Telephone Encounter (Signed)
Pt calling stating we need to fax [facility] stating that he requires his diabetic testing supplies (ie. Test strips, lancets) [phone]. ID# [id number] I called the above number provided by patient and [employer] # [phone]. I informed the patient that neither representative could help me with this and advised him to try to gather what was needed and call us back. Pt understands.

## 2012-08-04 ENCOUNTER — Emergency Department (HOSPITAL_COMMUNITY): Payer: Medicare HMO

## 2012-08-04 ENCOUNTER — Emergency Department (HOSPITAL_COMMUNITY)
Admission: EM | Admit: 2012-08-04 | Discharge: 2012-08-04 | Disposition: A | Discharge status: Home / Self Care | Payer: Medicare HMO | Attending: Emergency Medicine | Admitting: Emergency Medicine

## 2012-08-04 ENCOUNTER — Encounter (HOSPITAL_COMMUNITY): Payer: Self-pay

## 2012-08-04 DIAGNOSIS — Z8679 Personal history of other diseases of the circulatory system: Secondary | ICD-10-CM | POA: Insufficient documentation

## 2012-08-04 DIAGNOSIS — I4891 Unspecified atrial fibrillation: Secondary | ICD-10-CM | POA: Insufficient documentation

## 2012-08-04 DIAGNOSIS — Z8674 Personal history of sudden cardiac arrest: Secondary | ICD-10-CM | POA: Insufficient documentation

## 2012-08-04 DIAGNOSIS — R5381 Other malaise: Secondary | ICD-10-CM | POA: Insufficient documentation

## 2012-08-04 DIAGNOSIS — Z87891 Personal history of nicotine dependence: Secondary | ICD-10-CM | POA: Insufficient documentation

## 2012-08-04 DIAGNOSIS — I471 Supraventricular tachycardia: Secondary | ICD-10-CM

## 2012-08-04 DIAGNOSIS — I252 Old myocardial infarction: Secondary | ICD-10-CM | POA: Insufficient documentation

## 2012-08-04 DIAGNOSIS — S88119A Complete traumatic amputation at level between knee and ankle, unspecified lower leg, initial encounter: Secondary | ICD-10-CM | POA: Insufficient documentation

## 2012-08-04 DIAGNOSIS — E119 Type 2 diabetes mellitus without complications: Secondary | ICD-10-CM | POA: Insufficient documentation

## 2012-08-04 DIAGNOSIS — Z8739 Personal history of other diseases of the musculoskeletal system and connective tissue: Secondary | ICD-10-CM | POA: Insufficient documentation

## 2012-08-04 DIAGNOSIS — Z8669 Personal history of other diseases of the nervous system and sense organs: Secondary | ICD-10-CM | POA: Insufficient documentation

## 2012-08-04 DIAGNOSIS — Z9861 Coronary angioplasty status: Secondary | ICD-10-CM | POA: Insufficient documentation

## 2012-08-04 DIAGNOSIS — R42 Dizziness and giddiness: Secondary | ICD-10-CM | POA: Insufficient documentation

## 2012-08-04 DIAGNOSIS — I498 Other specified cardiac arrhythmias: Secondary | ICD-10-CM | POA: Insufficient documentation

## 2012-08-04 DIAGNOSIS — E785 Hyperlipidemia, unspecified: Secondary | ICD-10-CM | POA: Insufficient documentation

## 2012-08-04 DIAGNOSIS — Z862 Personal history of diseases of the blood and blood-forming organs and certain disorders involving the immune mechanism: Secondary | ICD-10-CM | POA: Insufficient documentation

## 2012-08-04 DIAGNOSIS — R110 Nausea: Secondary | ICD-10-CM | POA: Insufficient documentation

## 2012-08-04 DIAGNOSIS — R0789 Other chest pain: Secondary | ICD-10-CM | POA: Insufficient documentation

## 2012-08-04 DIAGNOSIS — Z79899 Other long term (current) drug therapy: Secondary | ICD-10-CM | POA: Insufficient documentation

## 2012-08-04 DIAGNOSIS — I2510 Atherosclerotic heart disease of native coronary artery without angina pectoris: Secondary | ICD-10-CM | POA: Insufficient documentation

## 2012-08-04 DIAGNOSIS — Z8719 Personal history of other diseases of the digestive system: Secondary | ICD-10-CM | POA: Insufficient documentation

## 2012-08-04 DIAGNOSIS — R002 Palpitations: Secondary | ICD-10-CM | POA: Insufficient documentation

## 2012-08-04 DIAGNOSIS — Z86718 Personal history of other venous thrombosis and embolism: Secondary | ICD-10-CM | POA: Insufficient documentation

## 2012-08-04 DIAGNOSIS — Z7901 Long term (current) use of anticoagulants: Secondary | ICD-10-CM | POA: Insufficient documentation

## 2012-08-04 DIAGNOSIS — Z7982 Long term (current) use of aspirin: Secondary | ICD-10-CM | POA: Insufficient documentation

## 2012-08-04 DIAGNOSIS — Z794 Long term (current) use of insulin: Secondary | ICD-10-CM | POA: Insufficient documentation

## 2012-08-04 DIAGNOSIS — N189 Chronic kidney disease, unspecified: Secondary | ICD-10-CM | POA: Insufficient documentation

## 2012-08-04 HISTORY — DX: Unspecified atrial flutter: I48.92

## 2012-08-04 HISTORY — DX: Patient's other noncompliance with medication regimen for other reason: Z91.148

## 2012-08-04 HISTORY — DX: Ischemic cardiomyopathy: I25.5

## 2012-08-04 HISTORY — DX: Patient's other noncompliance with medication regimen: Z91.14

## 2012-08-04 LAB — CBC WITH DIFFERENTIAL/PLATELET
Eosinophils Relative: 1 % (ref 0–5)
HCT: 44 % (ref 39.0–52.0)
Hemoglobin: 15.8 g/dL (ref 13.0–17.0)
Lymphocytes Relative: 37 % (ref 12–46)
Lymphs Abs: 1.9 10*3/uL (ref 0.7–4.0)
MCV: 84.8 fL (ref 78.0–100.0)
Monocytes Absolute: 0.5 10*3/uL (ref 0.1–1.0)
Platelets: 182 10*3/uL (ref 150–400)
RBC: 5.19 MIL/uL (ref 4.22–5.81)
WBC: 5.2 10*3/uL (ref 4.0–10.5)

## 2012-08-04 LAB — COMPREHENSIVE METABOLIC PANEL
ALT: 14 U/L (ref 0–53)
AST: 16 U/L (ref 0–37)
CO2: 24 mEq/L (ref 19–32)
Chloride: 102 mEq/L (ref 96–112)
Creatinine, Ser: 1.26 mg/dL (ref 0.50–1.35)
GFR calc non Af Amer: 58 mL/min — ABNORMAL LOW (ref >90)
Glucose, Bld: 205 mg/dL — ABNORMAL HIGH (ref 70–99)
Sodium: 136 mEq/L (ref 135–145)
Total Bilirubin: 0.3 mg/dL (ref 0.3–1.2)

## 2012-08-04 LAB — POCT I-STAT TROPONIN I: Troponin i, poc: 0.05 ng/mL (ref 0.00–0.08)

## 2012-08-04 MED ORDER — INSULIN PEN NEEDLE 31G X 6 MM MISC: Status: DC

## 2012-08-04 MED ORDER — GLUCOSE BLOOD VI STRP: ORAL_STRIP | Status: DC

## 2012-08-04 MED ORDER — BD SWAB SINGLE USE REGULAR PADS: MEDICATED_PAD | Status: DC

## 2012-08-04 MED ORDER — METOPROLOL SUCCINATE ER 50 MG PO TB24
75.0000 mg | ORAL_TABLET | Freq: Once | ORAL | Status: AC
  Administered 2012-08-04: 75 mg via ORAL
  Filled 2012-08-04: qty 1

## 2012-08-04 MED ORDER — PRODIGY TWIST TOP LANCETS 28G MISC: Status: DC

## 2012-08-04 MED ORDER — PRODIGY AUTOCODE BLOOD GLUCOSE W/DEVICE KIT: PACK | Status: DC

## 2012-08-04 MED ORDER — ADENOSINE 6 MG/2ML IV SOLN
6.0000 mg | Freq: Once | INTRAVENOUS | Status: AC
  Administered 2012-08-04: 6 mg via INTRAVENOUS

## 2012-08-04 MED ORDER — ADENOSINE 6 MG/2ML IV SOLN
INTRAVENOUS | Status: AC
  Filled 2012-08-04: qty 6

## 2012-08-04 NOTE — ED Notes (Signed)
Per ems-pt awoke at 0400 with diaphoresis. Pt c/o mild nausea. Pt home health nurse noted rapid heart rate, pt noted to be in SVT. HR 160-166. Pt has hx of same. Denies pain. Bp-108/70

## 2012-08-04 NOTE — ED Notes (Signed)
Pt currently denies cp, denies nausea. Post conversion, pt is no longer noted to be diaphoretic. Denies any complaints at this time.

## 2012-08-04 NOTE — ED Notes (Signed)
Pt converted to sinus tach after 6mg  of adenosine, HR 108 currently.

## 2012-08-04 NOTE — Consult Note (Signed)
Cardiology Consultation Note  Patient ID: Leroy [name] MRN: [id number], DOB: 09/09/1945 Date of Encounter: 08/04/2012, 2:34 PM Primary Physician: [name], MD Primary Cardiologist: Hochrein  Chief Complaint: rapid heart rate, mild nausea, sweating  HPI: Leroy [name] is a 67 y/o M with history of CAD s/p CABG, PAF/flutter on Xarelto, SVT 05/2012, ICM EF 30-35%, mild CKD who presented to [facility] today with diaphoresis and found to be in SVT. He woke up around early this morning and noted he felt more hot than usual. He also developed diaphoresis. Home health nursing came in and checked his HR and noted it to be in the 170's. He was subsequently sent to the ER where EKG showed SVT. He was given 6mg  IV adenosine with subsequent conversion to NSR. Strips are not available from telemetry, but the EDP corroborates the conversion. He had quick improvement in symptoms. No CP, SOB, dizziness, presyncope, or syncope. He has a history of medication noncompliance and reports being out of his metoprolol for 1 week due to insurance issues. He states his daughter is going to pick up his medication today. In the ER, Troponin neg x 1, glucose 205, Cr 1.26 otherwise labs unremarkable and CXR nonacute.  Past Medical History  Diagnosis Date  . Hyperlipidemia   . Ischemic cardiomyopathy     a. EF 40% 2010. b. Echo 05/2012: EF 30-35%.  . Chronic anticoagulation   . Cataract   . Hypotension   . Paroxysmal atrial fibrillation   . Coronary artery disease     a. s/p remote CABG 2001 at [facility] b. Cath 03/2010: for med rx.  . Peripheral vascular disease     a. s/p BKA.  [name] History of blood clots     in legs--this was in 2011--has been off of Coumadin 80months;takes Xarelto daily  . Peripheral neuropathy   . Joint pain   . H/O hiatal hernia   . Chronic kidney disease     on Lisinopril to protect kidneys d/t being on Metformin per pt  . Diabetes mellitus     takes Metformni daily  and Lantus 50units bid    . Myocardial infarction     total of 8 heart attacks;last one in 2011  . Atrial flutter      Most Recent Cardiac Studies: Nuc 05/04/2012 Impression  Exercise Capacity: Lexiscan with no exercise.  BP Response: Normal blood pressure response.  Clinical Symptoms: No significant symptoms noted.  ECG Impression: No significant ST segment change suggestive of ischemia.  Comparison with Prior Nuclear Study: No images to compare  Overall Impression: High risk stress nuclear study. He has had a large Inferior apical MI and has a very small area of peri-infarct ischemia toward the inferior apical segment. His LV function is markedly depressed at 19%.  LV Ejection Fraction: 19%. LV Wall Motion: There is severe global hypokinesis.   Cardiac Cath 03/2010 PROCEDURE:  Left heart catheterization with coronary angiography.  The right femoral artery was cannulated using the assistance of a long Smart needle.  A long 5-French sheath was placed. HEMODYNAMICS:  LV pressure is 132/27.  The aortic pressure is 128/74. ANGIOGRAPHY:  LAD:  The left anterior descending artery has a proximal 50-60% stenosis followed by a 50% stenosis right at the first diagonal artery.  There is competitive flow visible from the IMA graft.  The first diagonal artery is a relatively small branch and is free of disease. Circumflex artery:  The circumflex artery is a moderate-sized vessel. The  first and second obtuse marginal arteries are seen to be occluded. The circumflex vessel is occluded after giving off the second obtuse marginal artery. Right coronary artery:  The right coronary artery is severely diseased. There is a proximal 90% stenosis followed by a mid 80% stenosis.  The RCA is then occluded.  This appears to be a chronic total occlusion. The distal RCA is seen filling from collateral circulation from the left side. Saphenous vein graft to the obtuse marginal artery is a very large graft.  It appears to be a  Y-shaped graft with a natural branch.  There are 2 separate anastomosis to the circumflex vessel.  The obtuse marginal artery #1, obtuse marginal artery #2, and posterolateral branch are all seen quite well.  The flow down to these vessels is quite good. The saphenous vein graft to the right coronary artery was presumed to be present at one point.  We were not able to find insertion with the right coronary catheters.  We also performed an aortic root shot and we were not able to see any graft to the right coronary artery.  It is presumed to be occluded and is presumed to be responsible for the patient's wall motion abnormality and his presentation with chest pain. Left internal mammary artery to LAD:  The left internal mammary artery is a very nice graft.  The anastomosis to the LAD is normal.  The LAD has minor luminal irregularities.  There is some competitive flow from the native LAD circulation. Left ventriculogram:  The left ventriculogram was performed in a 30-RAO position.  It reveals moderate left ventricular dysfunction with an ejection fraction of around 40%.  There is inferior wall akinesis. There is no significant mitral regurgitation. COMPLICATIONS:  None. CONCLUSION: 1. Severe native coronary artery disease. 2. There are patent grafts to the LAD and circumflex artery.  The     right coronary artery is occluded and fills via collateral vessels.     It is presumed that there was a graft to the right coronary artery     at one point but it is now occluded. We will continue with medical therapy for his coronary artery disease and congestive heart failure.  He also had some episodes of intermittent atrial fibrillation last night.  We will restart heparin tonight and pharmacy will restart Coumadin.  We will discontinue the Effient.     Surgical History:  Past Surgical History  Procedure Laterality Date  . Revasculariztion  2011/2010    x 2   . Coronary artery bypass graft   03/2000    quadruple  . Tonsillectomy    . Hernia repair    . Cardiac catheterization  03/19/10  . Abdominal aortagram  04-30-12  . Coronary angioplasty      3 stents  . Adenoidectomy    . Amputation Left 06/03/2012    Procedure: AMPUTATION BELOW KNEE;  Surgeon: [name], MD;  Location: [facility] OR;  Service: Vascular;  Laterality: Left;     Home Meds: Prior to Admission medications   Medication Sig Start Date End Date Taking? Authorizing Provider  aspirin EC 325 MG tablet Take 325 mg by mouth daily.   Yes Historical Provider, MD  colesevelam [facility]) 625 MG tablet Take 3 tablets (1,875 mg total) by mouth 2 (two) times daily with a meal. 04/15/12  Yes Etta Grandchild, MD  insulin aspart (NOVOLOG) 100 UNIT/ML injection Inject 5 Units into the skin 3 (three) times daily as needed for high  blood sugar.   Yes Historical Provider, MD  insulin glargine (LANTUS SOLOSTAR) 100 UNIT/ML injection Inject 50 Units into the skin 2 (two) times daily. Please dispense a 90 day supply with refills to complete the year 04/28/12  Yes Etta Grandchild, MD  lisinopril (PRINIVIL,ZESTRIL) 2.5 MG tablet Take 1 tablet (2.5 mg total) by mouth daily. 06/10/12  Yes Richarda Overlie, MD  metoprolol succinate (TOPROL-XL) 25 MG 24 hr tablet Take 3 tablets (75 mg total) by mouth daily. 06/10/12  Yes Richarda Overlie, MD  nitroGLYCERIN (NITROSTAT) 0.4 MG SL tablet Place 1 tablet (0.4 mg total) under the tongue every 5 (five) minutes as needed. 01/21/12  Yes Rollene Rotunda, MD  Rivaroxaban (XARELTO) 20 MG TABS Take 20 mg by mouth daily.   Yes Historical Provider, MD  Alcohol Swabs (B-D SINGLE USE SWABS REGULAR) PADS Use As Instructed to test Blood Glucose Twice Daily. Dx: 250.62 08/04/12   Etta Grandchild, MD  Blood Glucose Monitoring Suppl (PRODIGY AUTOCODE BLOOD GLUCOSE) W/DEVICE KIT Use As Instructed to test Blood Glucose Twice Daily. Dx: 250.62 08/04/12   Etta Grandchild, MD  glucose blood (PRODIGY TEST) test strip Use As Instructed to test  Blood Glucose Twice Daily. Dx: 250.62 08/04/12   Etta Grandchild, MD  Insulin Pen Needle 31G X 6 MM MISC Use As Instructed to test Blood Glucose Twice Daily. Dx: 250.62 08/04/12   Etta Grandchild, MD  PRODIGY TWIST TOP LANCETS 28G MISC Use As Instructed to test Blood Glucose Twice Daily. Dx: 250.62 08/04/12   Etta Grandchild, MD    Allergies:  Allergies  Allergen Reactions  . Statins     Muscle aches  . Amlodipine Besylate     Muscle aches making difficult to walk  . Cephalexin     REACTION: Hives    History   Social History  . Marital Status: Legally Separated    Spouse Name: N/A    Number of Children: N/A  . Years of Education: N/A   Occupational History  . Not on file.   Social History Main Topics  . Smoking status: Former Smoker    Types: Cigarettes  . Smokeless tobacco: Never Used     Comment: quit smoking 53yrs ago  . Alcohol Use: No     Comment: occasionally  . Drug Use: No  . Sexually Active: Not Currently   Other Topics Concern  . Not on file   Social History Narrative  . No narrative on file     Family History  Problem Relation Age of Onset  . Heart disease Mother     before age 15  . Diabetes Mother   . Heart attack Mother   . Heart disease Father     Review of Systems: General: negative for chills, fever, night sweats or weight changes.  Cardiovascular: negative for edema, orthopnea, palpitations, shortness of breath or dyspnea on exertion Dermatological: negative for rash Respiratory: negative for cough or wheezing Urologic: negative for hematuria Abdominal: negative for nausea, vomiting, diarrhea, bright red blood per rectum, melena, or hematemesis Neurologic: negative for visual changes, syncope, or dizziness All other systems reviewed and are otherwise negative except as noted above.  Labs:   Lab Results  Component Value Date   WBC 5.2 08/04/2012   HGB 15.8 08/04/2012   HCT 44.0 08/04/2012   MCV 84.8 08/04/2012   PLT 182 08/04/2012      Recent Labs Lab 08/04/12 1232  NA 136  K 4.0  CL  102  CO2 24  BUN 17  CREATININE 1.26  CALCIUM 9.3  PROT 6.8  BILITOT 0.3  ALKPHOS 104  ALT 14  AST 16  GLUCOSE 205*    Recent Labs  08/04/12 1232  TROPONINI <0.30   Radiology/Studies:  Dg Chest 2 View 08/04/2012  *RADIOLOGY REPORT*  Clinical Data: Shortness of breath, tachycardia.  CHEST - 2 VIEW  Comparison: 06/02/2012  Findings: Prior median sternotomy.  Heart is normal size.  No confluent airspace opacities or effusions.  No acute bony abnormality.  IMPRESSION: No acute cardiopulmonary disease.   Original Report Authenticated By: Charlett Nose, M.D.      EKG:  1) SVT 163bpm borderline IVCD, right axis deviation, repol abnormality with TWI inferolaterally II, III, avF, v4-V6 2) f/u: sinus tach 105bpm right axis deviation, repol abnormality with TWI inferolaterally  II, III, avF, v3-V6 He has had prior inf/lat changes on EKG when in NSR  Physical Exam: Blood pressure 109/75-150/87, pulse 86, temperature 98.3 F (36.8 C), temperature source Oral, resp. rate 18, SpO2 97.00%. General: Well developed, well nourished obese AAM in no acute distress. Head: Normocephalic, atraumatic, sclera non-icteric, no xanthomas, nares are without discharge.  Neck: Negative for carotid bruits. JVD not elevated. Lungs: Clear bilaterally to auscultation without wheezes, rales, or rhonchi. Breathing is unlabored. Heart: RRR with S1 S2. No murmurs, rubs, or gallops appreciated. Abdomen: Soft, non-tender, non-distended with normoactive bowel sounds. No hepatomegaly. No rebound/guarding. No obvious abdominal masses. Msk:  Strength and tone appear normal for age. Extremities: No clubbing or cyanosis. No edema.  Distal pedal pulses are 2+ and equal bilaterally. Neuro: Alert and oriented X 3. No focal deficit. No facial asymmetry. Moves all extremities spontaneously. Psych:  Responds to questions appropriately with a normal affect.    ASSESSMENT AND  PLAN:  1. Recurrence of SVT 2. Medication noncompliance with BB 3. PAF/atrial flutter 4. CAD s/p remote CABG without ischemic symptoms 5. ICM EF 30-35%, without symptoms of CHF 6. Diabetes mellitus 7. HTN  Resume beta blocker (had been out of it for 1 week). Will give first dose here in the ER. If next troponin negative, OK to go home. He will follow up in our office - we will arrange.  Signed, Ronie Spies PA-C 08/04/2012, 2:34 PM  Patient seen with PA, agree with the above note.  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  He has had this in the past.  He has been out of Toprol XL x 1 week.  SVT converted to NSR with adenosine.  No chest pain or dyspnea.  If followup cardiac enzymes negative, he can go home.  He should get a dose of Toprol Xl 75 mg in the ER, then restart 75 mg daily at home.  Followup 5/14 with Tereso Newcomer PA at 11:10 am.   Marca Ancona 08/04/2012 4:00 PM

## 2012-08-04 NOTE — Addendum Note (Signed)
Addended by: Regis Bill on: 08/04/2012 10:52 AM   Modules accepted: Orders

## 2012-08-04 NOTE — Telephone Encounter (Signed)
[  [employer]] [facility] requested diabetic testing supplies for 90-day, Prodigy glucometer [total 5 items], done/SLS

## 2012-08-04 NOTE — ED Provider Notes (Signed)
History     CSN: [id number]  Arrival date & time 08/04/12  1146   First MD Initiated Contact with Patient 08/04/12 1153      Chief Complaint  Patient presents with  . Tachycardia    (Consider location/radiation/quality/duration/timing/severity/associated sxs/prior treatment) HPI Comments: Patient arrives by EMS with palpitations and nausea. Found to be in SVT. States he has a history of the same. EMS unable to establish IV access and no treatment given. Patient states it started around 4 AM he woke up. He denies any chest pain or shortness of breath. He states he did not come in sooner because he "had things to do". He has a history of chronic kidney disease, CHF, CABG, CAD. Has been out of his metoprolol for 1 week.  The history is provided by the patient and the EMS personnel.    Past Medical History  Diagnosis Date  . Hyperlipidemia   . Ischemic cardiomyopathy     a. EF 40% 2010. b. Echo 05/2012: EF 30-35%.  . Chronic anticoagulation   . Cataract   . Hypotension   . Paroxysmal atrial fibrillation   . Coronary artery disease     a. s/p remote CABG 2001 at [facility] b. Cath 03/2010: for med rx.  . Peripheral vascular disease     a. s/p L BKA 2014.  [name] History of blood clots     in legs--this was in 2011--has been off of Coumadin 76months;takes Xarelto daily  . Peripheral neuropathy   . Joint pain   . H/O hiatal hernia   . Chronic kidney disease     on Lisinopril to protect kidneys d/t being on Metformin per pt  . Diabetes mellitus     takes Metformni daily  and Lantus 50units bid  . Myocardial infarction     total of 8 heart attacks;last one in 2011  . Atrial flutter   . H/O medication noncompliance     Due to insurance issues    Past Surgical History  Procedure Laterality Date  . Revasculariztion  2011/2010    x 2   . Coronary artery bypass graft  03/2000    quadruple  . Tonsillectomy    . Hernia repair    . Cardiac catheterization  03/19/10  . Abdominal aortagram   04-30-12  . Coronary angioplasty      3 stents  . Adenoidectomy    . Amputation Left 06/03/2012    Procedure: AMPUTATION BELOW KNEE;  Surgeon: [name], MD;  Location: [facility] OR;  Service: Vascular;  Laterality: Left;    Family History  Problem Relation Age of Onset  . Heart disease Mother     before age 5  . Diabetes Mother   . Heart attack Mother   . Heart disease Father     History  Substance Use Topics  . Smoking status: Former Smoker    Types: Cigarettes  . Smokeless tobacco: Never Used     Comment: quit smoking 69yrs ago  . Alcohol Use: No     Comment: occasionally      Review of Systems  Constitutional: Positive for activity change. Negative for fever and diaphoresis.  HENT: Negative for congestion, rhinorrhea and neck pain.   Respiratory: Positive for chest tightness. Negative for cough and shortness of breath.   Cardiovascular: Positive for palpitations. Negative for chest pain.  Gastrointestinal: Negative for nausea, vomiting and abdominal pain.  Genitourinary: Negative for dysuria and hematuria.  Musculoskeletal: Negative for myalgias, back pain and arthralgias.  Skin: Negative for rash.  Neurological: Positive for weakness and light-headedness. Negative for dizziness.  A complete 10 system review of systems was obtained and all systems are negative except as noted in the HPI and PMH.    Allergies  Statins; Amlodipine besylate; and Cephalexin  Home Medications   Current Outpatient Rx  Name  Route  Sig  Dispense  Refill  . aspirin EC 325 MG tablet   Oral   Take 325 mg by mouth daily.         . colesevelam (WELCHOL) 625 MG tablet   Oral   Take 3 tablets (1,875 mg total) by mouth 2 (two) times daily with a meal.   120 tablet   11   . insulin aspart (NOVOLOG) 100 UNIT/ML injection   Subcutaneous   Inject 5 Units into the skin 3 (three) times daily as needed for high blood sugar.         . insulin glargine (LANTUS SOLOSTAR) 100 UNIT/ML  injection   Subcutaneous   Inject 50 Units into the skin 2 (two) times daily. Please dispense a 90 day supply with refills to complete the year   10 pen   2   . lisinopril (PRINIVIL,ZESTRIL) 2.5 MG tablet   Oral   Take 1 tablet (2.5 mg total) by mouth daily.   30 tablet   0   . metoprolol succinate (TOPROL-XL) 25 MG 24 hr tablet   Oral   Take 3 tablets (75 mg total) by mouth daily.   30 tablet   Review of   . nitroGLYCERIN (NITROSTAT) 0.4 MG SL tablet   Sublingual   Place 1 tablet (0.4 mg total) under the tongue every 5 (five) minutes as needed.   25 tablet   11   . Rivaroxaban (XARELTO) 20 MG TABS   Oral   Take 20 mg by mouth daily.         . Alcohol Swabs (B-D SINGLE USE SWABS REGULAR) PADS      Use As Instructed to test Blood Glucose Twice Daily. Dx: 250.62   200 each   1   . Blood Glucose Monitoring Suppl (PRODIGY AUTOCODE BLOOD GLUCOSE) W/DEVICE KIT      Use As Instructed to test Blood Glucose Twice Daily. Dx: 250.62   1 each   0   . glucose blood (PRODIGY TEST) test strip      Use As Instructed to test Blood Glucose Twice Daily. Dx: 250.62   200 each   1   . Insulin Pen Needle 31G X 6 MM MISC      Use As Instructed to test Blood Glucose Twice Daily. Dx: 250.62   200 each   1   . PRODIGY TWIST TOP LANCETS 28G MISC      Use As Instructed to test Blood Glucose Twice Daily. Dx: 250.62   200 each   1     BP 122/47  Pulse 84  Temp(Src) 98.3 F (36.8 C) (Oral)  Resp 22  SpO2 99%  Physical Exam  Constitutional: He is oriented to person, place, and time. He appears well-developed and well-nourished. No distress.  HENT:  Head: Normocephalic and atraumatic.  Mouth/Throat: Oropharynx is clear and moist. No oropharyngeal exudate.  Eyes: Conjunctivae and EOM are normal. Pupils are equal, round, and reactive to light.  Neck: Normal range of motion. Neck supple.  Cardiovascular: Normal rate, regular rhythm and normal heart sounds.   No murmur  heard. Tachycardic  Pulmonary/Chest: Effort normal and  breath sounds normal. No respiratory distress.  Abdominal: Soft. There is no tenderness. There is no rebound and no guarding.  Musculoskeletal: Normal range of motion. He exhibits no edema and no tenderness.  L BKA  Neurological: He is alert and oriented to person, place, and time. No cranial nerve deficit. He exhibits normal muscle tone. Coordination normal.  Skin: Skin is warm.    ED Course  CARDIOVERSION Date/Time: 08/04/2012 12:35 PM Performed by: Glynn Octave Authorized by: Glynn Octave Consent: Verbal consent obtained. The procedure was performed in an emergent situation. Risks and benefits: risks, benefits and alternatives were discussed Consent given by: patient Patient understanding: patient states understanding of the procedure being performed Patient sedated: no Cardioversion basis: emergent Pre-procedure rhythm: supraventricular tachycardia Patient position: patient was placed in a supine position Chest area: chest area exposed Electrodes: pads Electrodes placed: anterior-posterior Post-procedure rhythm: normal sinus rhythm Complications: no complications Patient tolerance: Patient tolerated the procedure well with no immediate complications. Comments: Converted with adenosine, no shock   (including critical care time)  Labs Reviewed  COMPREHENSIVE METABOLIC PANEL - Abnormal; Notable for the following:    Glucose, Bld 205 (*)    Albumin 3.0 (*)    GFR calc non Af Amer 58 (*)    GFR calc Af Amer 67 (*)    All other components within normal limits  PRO B NATRIURETIC PEPTIDE - Abnormal; Notable for the following:    Pro B Natriuretic peptide (BNP) 688.1 (*)    All other components within normal limits  PROTIME-INR - Abnormal; Notable for the following:    Prothrombin Time 22.0 (*)    INR 2.01 (*)    All other components within normal limits  CBC WITH DIFFERENTIAL  TROPONIN I  TROPONIN I  POCT  I-STAT TROPONIN I   Dg Chest 2 View  08/04/2012  *RADIOLOGY REPORT*  Clinical Data: Shortness of breath, tachycardia.  CHEST - 2 VIEW  Comparison: 06/02/2012  Findings: Prior median sternotomy.  Heart is normal size.  No confluent airspace opacities or effusions.  No acute bony abnormality.  IMPRESSION: No acute cardiopulmonary disease.   Original Report Authenticated By: Charlett Nose, M.D.      1. SVT (supraventricular tachycardia)       MDM  SVT with palpitations and chest tightness. Mental status normal limits except for blood pressure. Patient given IV adenosine on arrival with conversion to sinus tachycardia.  Patient converted to sinus after 6 mg of adenosine. Denies any chest pain or shortness of breath. EKG shows chronic ST depressions and T-wave inversions.  Troponin negative. Symptoms did not recur after observation in the ED. Discussed with cardiology who agreed patient is safe for discharge. Patient given dose of metoprolol in ED before discharge.      Date: 08/04/2012  Rate: 163  Rhythm: supraventricular tachycardia (SVT)  QRS Axis: normal  Intervals: normal  ST/T Wave abnormalities: nonspecific ST/T changes  Conduction Disutrbances:none  Narrative Interpretation:   Old EKG Reviewed: changes noted   Date: 08/04/2012  Rate: 105  Rhythm: sinus tachycardia  QRS Axis: right  Intervals: normal  ST/T Wave abnormalities: nonspecific ST/T changes  Conduction Disutrbances:none  Narrative Interpretation: SVT resolved inferior lateral ST changes unchanged  Old EKG Reviewed: unchanged      Glynn Octave, MD 08/04/12 1709

## 2012-08-04 NOTE — ED Notes (Signed)
Phlebotomist at bedside to draw troponin.

## 2012-08-19 ENCOUNTER — Encounter: Payer: Medicare HMO | Admitting: Physician Assistant

## 2012-09-01 ENCOUNTER — Telehealth: Payer: Self-pay | Admitting: Internal Medicine

## 2012-09-01 NOTE — Telephone Encounter (Signed)
Ask the people at [facility] what needs to be written on the letter.

## 2012-09-01 NOTE — Telephone Encounter (Signed)
Pt had a leg amputated in Feb.  He needs a referral to [employer] saying it is necessary to have a prosthetic leg.  He needs a letter of necessity to go to [facility] also.   The insurance won't pay for it because his surgeon sent him to [employer] for the prosthetic instead of his PCP.   The leg is already made, but he can't pick it up until the insurance approves.

## 2012-09-02 ENCOUNTER — Encounter: Payer: Self-pay | Admitting: Physician Assistant

## 2012-09-02 ENCOUNTER — Ambulatory Visit (INDEPENDENT_AMBULATORY_CARE_PROVIDER_SITE_OTHER): Payer: Medicare HMO | Admitting: Physician Assistant

## 2012-09-02 VITALS — BP 100/50 | HR 63 | Ht 69.0 in | Wt 282.0 lb

## 2012-09-02 DIAGNOSIS — I4891 Unspecified atrial fibrillation: Secondary | ICD-10-CM

## 2012-09-02 DIAGNOSIS — I498 Other specified cardiac arrhythmias: Secondary | ICD-10-CM

## 2012-09-02 DIAGNOSIS — I428 Other cardiomyopathies: Secondary | ICD-10-CM

## 2012-09-02 DIAGNOSIS — I471 Supraventricular tachycardia: Secondary | ICD-10-CM

## 2012-09-02 NOTE — Assessment & Plan Note (Signed)
Patient had an emergency room visit for SVT after running out of his beta blocker. He converted with adenosine. He has a history of noncompliance in the past. He has had some palpitations since he's been home but they're short lived. He is in normal sinus rhythm today. Had a long discussion with patient about compliance. Follow up with Dr. Antoine Poche 2-3 months

## 2012-09-02 NOTE — Assessment & Plan Note (Signed)
Stable without evidence of heart failure 

## 2012-09-02 NOTE — Progress Notes (Signed)
HPI:   This 67 year old male patient who was recently in the emergency room with SVT after running out of his beta blocker. He converted with adenosine 6 mg IV. He was seen by Dr. Sherlie Ban. His troponins were negative and labs unremarkable. He has a history of CABG, versus small atrial fibrillation and flutter on Zaroxolyn, SVT in 05/2012, ischemic cardiomyopathy ejection fraction 30-35%, mild chronic kidney disease.  Patient has history of noncompliance. In the office today he has had some heart fluttering but it is short-lived.   Allergies: -- Statins    --  Muscle aches  -- Amlodipine Besylate    --  Muscle aches making difficult to walk  -- Cephalexin    --  REACTION: Hives  Current Outpatient Prescriptions on File Prior to Visit: Alcohol Swabs (B-D SINGLE USE SWABS REGULAR) PADS, Use As Instructed to test Blood Glucose Twice Daily. Dx: 250.62, Disp: 200 each, Rfl: 1 aspirin EC 325 MG tablet, Take 325 mg by mouth daily., Disp: , Rfl:  Blood Glucose Monitoring Suppl (PRODIGY AUTOCODE BLOOD GLUCOSE) W/DEVICE KIT, Use As Instructed to test Blood Glucose Twice Daily. Dx: 250.62, Disp: 1 each, Rfl: 0 colesevelam (WELCHOL) 625 MG tablet, Take 3 tablets (1,875 mg total) by mouth 2 (two) times daily with a meal., Disp: 120 tablet, Rfl: 11 glucose blood (PRODIGY TEST) test strip, Use As Instructed to test Blood Glucose Twice Daily. Dx: 250.62, Disp: 200 each, Rfl: 1 insulin aspart (NOVOLOG) 100 UNIT/ML injection, Inject 5 Units into the skin 3 (three) times daily as needed for high blood sugar., Disp: , Rfl:  insulin glargine (LANTUS SOLOSTAR) 100 UNIT/ML injection, Inject 50 Units into the skin 2 (two) times daily. Please dispense a 90 day supply with refills to complete the year, Disp: 10 pen, Rfl: 2 Insulin Pen Needle 31G X 6 MM MISC, Use As Instructed to test Blood Glucose Twice Daily. Dx: 250.62, Disp: 200 each, Rfl: 1 lisinopril (PRINIVIL,ZESTRIL) 2.5 MG tablet, Take 1 tablet (2.5 mg total) by  mouth daily., Disp: 30 tablet, Rfl: 0 metoprolol succinate (TOPROL-XL) 25 MG 24 hr tablet, Take 3 tablets (75 mg total) by mouth daily., Disp: 30 tablet, Rfl: Review of nitroGLYCERIN (NITROSTAT) 0.4 MG SL tablet, Place 1 tablet (0.4 mg total) under the tongue every 5 (five) minutes as needed., Disp: 25 tablet, Rfl: 11 PRODIGY TWIST TOP LANCETS 28G MISC, Use As Instructed to test Blood Glucose Twice Daily. Dx: 250.62, Disp: 200 each, Rfl: 1 Rivaroxaban (XARELTO) 20 MG TABS, Take 20 mg by mouth daily., Disp: , Rfl:   No current facility-administered medications on file prior to visit.   Past Medical History:   Hyperlipidemia                                               Ischemic cardiomyopathy                                        Comment:a. EF 40% 2010. b. Echo 05/2012: EF 30-35%.   Chronic anticoagulation                                      Cataract  Hypotension                                                  Paroxysmal atrial fibrillation                               Coronary artery disease                                        Comment:a. s/p remote CABG 2001 at [facility] b. Cath               03/2010: for med rx.   Peripheral vascular disease                                    Comment:a. s/p L BKA 2014.   History of blood clots                                         Comment:in legs--this was in 2011--has been off of               Coumadin 10months;takes Xarelto daily   Peripheral neuropathy                                        Joint pain                                                   H/O hiatal hernia                                            Chronic kidney disease                                         Comment:on Lisinopril to protect kidneys d/t being on               Metformin per pt   Diabetes mellitus                                              Comment:takes Metformni daily  and Lantus 50units bid   Myocardial  infarction                                          Comment:total of 8 heart attacks;last one in 2011   Atrial flutter  H/O medication noncompliance                                   Comment:Due to insurance issues  Past Surgical History:   revasculariztion                                 2011/2010      Comment:x 2    CORONARY ARTERY BYPASS GRAFT                     03/2000        Comment:quadruple   TONSILLECTOMY                                                 HERNIA REPAIR                                                 CARDIAC CATHETERIZATION                          03/19/10     ABDOMINAL AORTAGRAM                              04-30-12      CORONARY ANGIOPLASTY                                            Comment:3 stents   ADENOIDECTOMY                                                 AMPUTATION                                      Left 06/03/2012      Comment:Procedure: AMPUTATION BELOW KNEE;  Surgeon:               [name], MD;  Location: [facility] OR;  Service:               Vascular;  Laterality: Left;  Review of patient's family history indicates:   Heart disease                  Mother                     Comment: before age 74   Diabetes                       Mother                   Heart attack  Mother                   Heart disease                  Father                   Social History   Marital Status: Legally Separated   Spouse Name:                      Years of Education:                 Number of children:             Occupational History   None on file  Social History Main Topics   Smoking Status: Former Smoker                   Packs/Day: 0.00  Years:           Types: Cigarettes   Smokeless Status: Never Used                       Comment: quit smoking 61yrs ago   Alcohol Use: No                Comment: occasionally   Drug Use: No             Sexual Activity: Not Currently      Other Topics             Concern   None on file  Social History Narrative   None on file    ROS:see history of present illness otherwise negative   PHYSICAL EXAM: Obese, in no acute distress. Neck: No JVD, HJR, Bruit, or thyroid enlargement  Lungs: No tachypnea, clear without wheezing, rales, or rhonchi  Cardiovascular: RRR, PMI not displaced, 2/6 systolic murmur at the left sternal border, no gallops, bruit, thrill, or heave.  Abdomen: BS normal. Soft without organomegaly, masses, lesions or tenderness.  Extremities: without cyanosis, clubbing or edema. Good distal pulses bilateral  SKin: Warm, no lesions or rashes   Musculoskeletal: No deformities  Neuro: no focal signs  BP 100/50  Pulse 63  Ht 5\' 9"  (1.753 m)  Wt 282 lb (127.914 kg)  BMI 41.63 kg/m2   EKG:NSR with inferior infarct and ST-T wave abnormalities laterally

## 2012-09-02 NOTE — Assessment & Plan Note (Signed)
In normal sinus rhythm 

## 2012-09-02 NOTE — Patient Instructions (Addendum)
PLEASE FOLLOW UP WITH DR. HOCHREIN IN 2 MONTHS  NO CHANGES WERE MADE TODAY

## 2012-09-04 NOTE — Telephone Encounter (Signed)
Returned call to [name]//LMOVM advising per MD and for pt to call back with the requested information for letter or provide phone number to [employer].

## 2012-09-08 ENCOUNTER — Telehealth: Payer: Self-pay | Admitting: Internal Medicine

## 2012-09-08 NOTE — Telephone Encounter (Signed)
Please call Leroy [name] or Mr Breighner about the prosthetic leg.

## 2012-09-08 NOTE — Telephone Encounter (Signed)
LMOVM to call back with need information x 2, closing phone note until pt calls back

## 2012-09-08 NOTE — Telephone Encounter (Signed)
[employer] told Leroy [name] that a letter of necessity as to why he needs the Prosthetic leg.  The letter needs to go to [facility] to process the claim.  It may also go to biotech. Please call her

## 2012-09-09 ENCOUNTER — Telehealth: Payer: Self-pay | Admitting: Internal Medicine

## 2012-09-09 NOTE — Telephone Encounter (Signed)
Spoke with daughter who also advised that a referral is needed to [employer] in order for insurance to cover prosthetic. Pt has not been seen since January and therefore has appointment for Thursday 09/10/12. I advised that referral can be entered at office visit tomorrow and office notes are sent along with referral so that insurance will have the required information.

## 2012-09-09 NOTE — Telephone Encounter (Signed)
Returned call back to Dr [facility] today  at [phone]// no answer.     Etheleen Sia at 09/08/2012 2:40 PM   Status: Signed            [employer] told Samatha that a letter of necessity as to why he needs the Prosthetic leg. The letter needs to go to [facility] to process the claim. It may also go to biotech.  Please call her

## 2012-09-09 NOTE — Telephone Encounter (Signed)
The patient's daughter called and is hoping to get a call back from the cma about his prosthetic leg.  The callback number - [phone]

## 2012-09-10 ENCOUNTER — Other Ambulatory Visit (INDEPENDENT_AMBULATORY_CARE_PROVIDER_SITE_OTHER): Payer: Medicare HMO

## 2012-09-10 ENCOUNTER — Telehealth: Payer: Self-pay

## 2012-09-10 ENCOUNTER — Encounter: Payer: Self-pay | Admitting: *Deleted

## 2012-09-10 ENCOUNTER — Encounter: Payer: Self-pay | Admitting: Internal Medicine

## 2012-09-10 ENCOUNTER — Ambulatory Visit (INDEPENDENT_AMBULATORY_CARE_PROVIDER_SITE_OTHER): Payer: Medicare HMO | Admitting: Internal Medicine

## 2012-09-10 VITALS — BP 120/84 | HR 69 | Temp 97.7°F | Resp 16

## 2012-09-10 DIAGNOSIS — E1149 Type 2 diabetes mellitus with other diabetic neurological complication: Secondary | ICD-10-CM

## 2012-09-10 DIAGNOSIS — B351 Tinea unguium: Secondary | ICD-10-CM

## 2012-09-10 DIAGNOSIS — IMO0002 Reserved for concepts with insufficient information to code with codable children: Secondary | ICD-10-CM

## 2012-09-10 DIAGNOSIS — Z89511 Acquired absence of right leg below knee: Secondary | ICD-10-CM | POA: Insufficient documentation

## 2012-09-10 DIAGNOSIS — S88112S Complete traumatic amputation at level between knee and ankle, left lower leg, sequela: Secondary | ICD-10-CM

## 2012-09-10 LAB — BASIC METABOLIC PANEL
GFR: 68.08 mL/min (ref >60.00)
Potassium: 4 mEq/L (ref 3.5–5.1)
Sodium: 137 mEq/L (ref 135–145)

## 2012-09-10 NOTE — Assessment & Plan Note (Signed)
Podiatry referral

## 2012-09-10 NOTE — Assessment & Plan Note (Signed)
Today I will check his A1C and will address if needed Will also check his lytes and renal function

## 2012-09-10 NOTE — Progress Notes (Signed)
Subjective:    Patient ID: Leroy [name], male    DOB: 1945/11/28, 67 y.o.   MRN: [id number]  Diabetes He presents for his follow-up diabetic visit. He has type 2 diabetes mellitus. His disease course has been stable. There are no hypoglycemic associated symptoms. Associated symptoms include polydipsia, polyphagia and polyuria. Pertinent negatives for diabetes include no blurred vision, no chest pain, no fatigue, no foot paresthesias, no foot ulcerations, no visual change, no weakness and no weight loss. There are no hypoglycemic complications. Diabetic complications include PVD. Current diabetic treatment includes intensive insulin program and insulin injections. He is compliant with treatment most of the time. His weight is stable. He is following a generally unhealthy diet. When asked about meal planning, he reported none. He has not had a previous visit with a dietician. He never participates in exercise. There is no change in his home blood glucose trend. An ACE inhibitor/angiotensin II receptor blocker is being taken. He does not see a podiatrist.Eye exam is current.      Review of Systems  Constitutional: Negative.  Negative for weight loss and fatigue.  HENT: Negative.   Eyes: Negative.  Negative for blurred vision.  Respiratory: Negative.  Negative for cough, chest tightness, shortness of breath, wheezing and stridor.   Cardiovascular: Negative.  Negative for chest pain, palpitations and leg swelling.  Gastrointestinal: Negative.  Negative for abdominal pain, diarrhea, constipation and blood in stool.  Endocrine: Positive for polydipsia, polyphagia and polyuria.  Musculoskeletal: Negative.   Skin: Negative.   Allergic/Immunologic: Negative.   Neurological: Negative.  Negative for weakness.  Hematological: Negative.   Psychiatric/Behavioral: Negative.        Objective:   Physical Exam  Vitals reviewed. Constitutional: He is oriented to person, place, and time. He appears  well-developed and well-nourished. No distress.  HENT:  Head: Normocephalic and atraumatic.  Mouth/Throat: Oropharynx is clear and moist. No oropharyngeal exudate.  Eyes: Conjunctivae are normal. Right eye exhibits no discharge. Left eye exhibits no discharge. No scleral icterus.  Neck: Normal range of motion. Neck supple. No JVD present. No tracheal deviation present. No thyromegaly present.  Cardiovascular: Normal rate, regular rhythm and intact distal pulses.  Exam reveals no gallop and no friction rub.   No murmur heard. Pulmonary/Chest: Effort normal and breath sounds normal. No stridor. No respiratory distress. He has no wheezes. He has no rales. He exhibits no tenderness.  Abdominal: Soft. Bowel sounds are normal. He exhibits no distension and no mass. There is no tenderness. There is no rebound and no guarding.  Musculoskeletal: Normal range of motion. He exhibits edema (2+ pitting edema in the RLE). He exhibits no tenderness.  Lymphadenopathy:    He has no cervical adenopathy.  Neurological: He is oriented to person, place, and time.  Skin: Skin is warm and dry. No rash noted. He is not diaphoretic. No erythema. No pallor.  Psychiatric: He has a normal mood and affect. His behavior is normal. Judgment and thought content normal.      Lab Results  Component Value Date   WBC 5.2 08/04/2012   HGB 15.8 08/04/2012   HCT 44.0 08/04/2012   PLT 182 08/04/2012   GLUCOSE 205* 08/04/2012   CHOL 263* 01/02/2012   TRIG 172.0* 01/02/2012   HDL 42.60 01/02/2012   LDLDIRECT 191.7 01/02/2012   LDLCALC  Value: 182        Total Cholesterol/HDL:CHD Risk Coronary Heart Disease Risk Table  Men   Women  1/2 Average Risk   3.4   3.3  Average Risk       5.0   4.4  2 X Average Risk   9.6   7.1  3 X Average Risk  23.4   11.0        Use the calculated Patient Ratio above and the CHD Risk Table to determine the patient's CHD Risk.        ATP III CLASSIFICATION (LDL):  <100     mg/dL   Optimal   161-096  mg/dL   Near or Above                    Optimal  130-159  mg/dL   Borderline  045-409  mg/dL   High  >811     mg/dL   Very High* 91/47/8295   ALT 14 08/04/2012   AST 16 08/04/2012   NA 136 08/04/2012   K 4.0 08/04/2012   CL 102 08/04/2012   CREATININE 1.26 08/04/2012   BUN 17 08/04/2012   CO2 24 08/04/2012   TSH 0.99 01/02/2012   INR 2.01* 08/04/2012   HGBA1C 11.3* 06/03/2012      Assessment & Plan:

## 2012-09-10 NOTE — Telephone Encounter (Signed)
Called [employer]  [phone]) to see what was need to start authorization for patient to continue at [facility]. Spoke with Clovis Cao at [facility] and was told that any provider with clinical information could call and start authorization.

## 2012-09-10 NOTE — Patient Instructions (Signed)
Type 2 Diabetes Mellitus, Adult Type 2 diabetes mellitus, often simply referred to as type 2 diabetes, is a long-lasting (chronic) disease. In type 2 diabetes, the pancreas does not make enough insulin (a hormone), the cells are less responsive to the insulin that is made (insulin resistance), or both. Normally, insulin moves sugars from food into the tissue cells. The tissue cells use the sugars for energy. The lack of insulin or the lack of normal response to insulin causes excess sugars to build up in the blood instead of going into the tissue cells. As a result, high blood sugar (hyperglycemia) develops. The effect of high sugar (glucose) levels can cause many complications. Type 2 diabetes was also previously called adult-onset diabetes but it can occur at any age.  RISK FACTORS  A person is predisposed to developing type 2 diabetes if someone in the family has the disease and also has one or more of the following primary risk factors:  Overweight.  An inactive lifestyle.  A history of consistently eating high-calorie foods. Maintaining a normal weight and regular physical activity can reduce the chance of developing type 2 diabetes. SYMPTOMS  A person with type 2 diabetes may not show symptoms initially. The symptoms of type 2 diabetes appear slowly. The symptoms include:  Increased thirst (polydipsia).  Increased urination (polyuria).  Increased urination during the night (nocturia).  Weight loss. This weight loss may be rapid.  Frequent, recurring infections.  Tiredness (fatigue).  Weakness.  Vision changes, such as blurred vision.  Fruity smell to your breath.  Abdominal pain.  Nausea or vomiting.  Cuts or bruises which are slow to heal.  Tingling or numbness in the hands or feet. DIAGNOSIS Type 2 diabetes is frequently not diagnosed until complications of diabetes are present. Type 2 diabetes is diagnosed when symptoms or complications are present and when blood  glucose levels are increased. Your blood glucose level may be checked by one or more of the following blood tests:  A fasting blood glucose test. You will not be allowed to eat for at least 8 hours before a blood sample is taken.  A random blood glucose test. Your blood glucose is checked at any time of the day regardless of when you ate.  A hemoglobin A1c blood glucose test. A hemoglobin A1c test provides information about blood glucose control over the previous 3 months.  An oral glucose tolerance test (OGTT). Your blood glucose is measured after you have not eaten (fasted) for 2 hours and then after you drink a glucose-containing beverage. TREATMENT   You may need to take insulin or diabetes medicine daily to keep blood glucose levels in the desired range.  You will need to match insulin dosing with exercise and healthy food choices. The treatment goal is to maintain the before meal blood sugar (preprandial glucose) level at 70 130 mg/dL. HOME CARE INSTRUCTIONS   Have your hemoglobin A1c level checked twice a year.  Perform daily blood glucose monitoring as directed by your caregiver.  Monitor urine ketones when you are ill and as directed by your caregiver.  Take your diabetes medicine or insulin as directed by your caregiver to maintain your blood glucose levels in the desired range.  Never run out of diabetes medicine or insulin. It is needed every day.  Adjust insulin based on your intake of carbohydrates. Carbohydrates can raise blood glucose levels but need to be included in your diet. Carbohydrates provide vitamins, minerals, and fiber which are an essential part of   a healthy diet. Carbohydrates are found in fruits, vegetables, whole grains, dairy products, legumes, and foods containing added sugars.    Eat healthy foods. Alternate 3 meals with 3 snacks.  Lose weight if overweight.  Carry a medical alert card or wear your medical alert jewelry.  Carry a 15 gram  carbohydrate snack with you at all times to treat low blood glucose (hypoglycemia). Some examples of 15 gram carbohydrate snacks include:  Glucose tablets, 3 or 4   Glucose gel, 15 gram tube  Raisins, 2 tablespoons (24 grams)  Jelly beans, 6  Animal crackers, 8  Regular pop, 4 ounces (120 mL)  Gummy treats, 9  Recognize hypoglycemia. Hypoglycemia occurs with blood glucose levels of 70 mg/dL and below. The risk for hypoglycemia increases when fasting or skipping meals, during or after intense exercise, and during sleep. Hypoglycemia symptoms can include:  Tremors or shakes.  Decreased ability to concentrate.  Sweating.  Increased heart rate.  Headache.  Dry mouth.  Hunger.  Irritability.  Anxiety.  Restless sleep.  Altered speech or coordination.  Confusion.  Treat hypoglycemia promptly. If you are alert and able to safely swallow, follow the 15:15 rule:  Take 15 20 grams of rapid-acting glucose or carbohydrate. Rapid-acting options include glucose gel, glucose tablets, or 4 ounces (120 mL) of fruit juice, regular soda, or low fat milk.  Check your blood glucose level 15 minutes after taking the glucose.  Take 15 20 grams more of glucose if the repeat blood glucose level is still 70 mg/dL or below.  Eat a meal or snack within 1 hour once blood glucose levels return to normal.    Be alert to polyuria and polydipsia which are early signs of hyperglycemia. An early awareness of hyperglycemia allows for prompt treatment. Treat hyperglycemia as directed by your caregiver.  Engage in at least 150 minutes of moderate-intensity physical activity a week, spread over at least 3 days of the week or as directed by your caregiver. In addition, you should engage in resistance exercise at least 2 times a week or as directed by your caregiver.  Adjust your medicine and food intake as needed if you start a new exercise or sport.  Follow your sick day plan at any time you  are unable to eat or drink as usual.  Avoid tobacco use.  Limit alcohol intake to no more than 1 drink per day for nonpregnant women and 2 drinks per day for men. You should drink alcohol only when you are also eating food. Talk with your caregiver whether alcohol is safe for you. Tell your caregiver if you drink alcohol several times a week.  Follow up with your caregiver regularly.  Schedule an eye exam soon after the diagnosis of type 2 diabetes and then annually.  Perform daily skin and foot care. Examine your skin and feet daily for cuts, bruises, redness, nail problems, bleeding, blisters, or sores. A foot exam by a caregiver should be done annually.  Brush your teeth and gums at least twice a day and floss at least once a day. Follow up with your dentist regularly.  Share your diabetes management plan with your workplace or school.  Stay up-to-date with immunizations.  Learn to manage stress.  Obtain ongoing diabetes education and support as needed.  Participate in, or seek rehabilitation as needed to maintain or improve independence and quality of life. Request a physical or occupational therapy referral if you are having foot or hand numbness or difficulties with grooming,   dressing, eating, or physical activity. SEEK MEDICAL CARE IF:   You are unable to eat food or drink fluids for more than 6 hours.  You have nausea and vomiting for more than 6 hours.  Your blood glucose level is over 240 mg/dL.  There is a change in mental status.  You develop an additional serious illness.  You have diarrhea for more than 6 hours.  You have been sick or have had a fever for a couple of days and are not getting better.  You have pain during any physical activity.  SEEK IMMEDIATE MEDICAL CARE IF:  You have difficulty breathing.  You have moderate to large ketone levels. MAKE SURE YOU:  Understand these instructions.  Will watch your condition.  Will get help right away if  you are not doing well or get worse. Document Released: 03/25/2005 Document Revised: 12/18/2011 Document Reviewed: 10/22/2011 ExitCare Patient Information 2014 [employer].  

## 2012-09-10 NOTE — Assessment & Plan Note (Addendum)
Referral for an orthotic for his LLE

## 2012-09-11 NOTE — Telephone Encounter (Signed)
Called [facility] Dr. Imogene Burn office spoke with Julious Oka and explained that insurance requires a letter stating why pt should been seen at [facility] where Dr. Imogene Burn referred him to (see previous phone note). Lilly called back LMOVM advising that she has a letter to supply [employer], has notified pt who has been instructed to call her back with a fax number. Pt daughter also notified

## 2012-10-01 ENCOUNTER — Encounter: Payer: Self-pay | Admitting: Vascular Surgery

## 2012-10-02 ENCOUNTER — Ambulatory Visit: Payer: Medicare HMO | Admitting: Vascular Surgery

## 2012-10-12 ENCOUNTER — Other Ambulatory Visit: Payer: Self-pay | Admitting: *Deleted

## 2012-10-12 MED ORDER — METOPROLOL SUCCINATE ER 25 MG PO TB24: 75.0000 mg | ORAL_TABLET | Freq: Every day | ORAL | Status: DC

## 2012-10-12 NOTE — Telephone Encounter (Signed)
Per Pam Flemings pt is suppose to take 75mg  of his Toprol. Pt daughter called wanting to verify pt SIG for his medication and to figure out if pt is suppose to be taking tartrate for succinate of his metoprolol. Fax Received. Refill Completed. La Dibella Chowoe (R.M.A)

## 2012-10-15 ENCOUNTER — Other Ambulatory Visit: Payer: Self-pay

## 2012-10-19 ENCOUNTER — Other Ambulatory Visit: Payer: Self-pay | Admitting: Geriatric Medicine

## 2012-10-23 ENCOUNTER — Other Ambulatory Visit: Payer: Self-pay | Admitting: *Deleted

## 2012-10-30 ENCOUNTER — Ambulatory Visit: Payer: Medicare HMO | Attending: Vascular Surgery | Admitting: Physical Therapy

## 2012-10-30 DIAGNOSIS — R5381 Other malaise: Secondary | ICD-10-CM | POA: Insufficient documentation

## 2012-10-30 DIAGNOSIS — R269 Unspecified abnormalities of gait and mobility: Secondary | ICD-10-CM | POA: Insufficient documentation

## 2012-10-30 DIAGNOSIS — Z5189 Encounter for other specified aftercare: Secondary | ICD-10-CM | POA: Insufficient documentation

## 2012-11-05 ENCOUNTER — Ambulatory Visit: Payer: Medicare HMO | Admitting: Physical Therapy

## 2012-11-06 ENCOUNTER — Ambulatory Visit: Payer: Medicare HMO | Attending: Vascular Surgery | Admitting: Physical Therapy

## 2012-11-06 ENCOUNTER — Ambulatory Visit (INDEPENDENT_AMBULATORY_CARE_PROVIDER_SITE_OTHER): Payer: Medicare HMO | Admitting: Vascular Surgery

## 2012-11-06 ENCOUNTER — Ambulatory Visit: Payer: Medicare HMO | Admitting: Vascular Surgery

## 2012-11-06 ENCOUNTER — Encounter: Payer: Self-pay | Admitting: Vascular Surgery

## 2012-11-06 ENCOUNTER — Encounter (INDEPENDENT_AMBULATORY_CARE_PROVIDER_SITE_OTHER): Payer: Medicare HMO | Admitting: *Deleted

## 2012-11-06 VITALS — BP 98/62 | HR 63 | Resp 16 | Ht 69.0 in | Wt 309.0 lb

## 2012-11-06 DIAGNOSIS — R269 Unspecified abnormalities of gait and mobility: Secondary | ICD-10-CM | POA: Insufficient documentation

## 2012-11-06 DIAGNOSIS — R5381 Other malaise: Secondary | ICD-10-CM | POA: Insufficient documentation

## 2012-11-06 DIAGNOSIS — Z48812 Encounter for surgical aftercare following surgery on the circulatory system: Secondary | ICD-10-CM

## 2012-11-06 DIAGNOSIS — I739 Peripheral vascular disease, unspecified: Secondary | ICD-10-CM | POA: Insufficient documentation

## 2012-11-06 DIAGNOSIS — I70269 Atherosclerosis of native arteries of extremities with gangrene, unspecified extremity: Secondary | ICD-10-CM

## 2012-11-06 DIAGNOSIS — Z5189 Encounter for other specified aftercare: Secondary | ICD-10-CM | POA: Insufficient documentation

## 2012-11-06 NOTE — Progress Notes (Signed)
[facility]  [name] was brought in to be seen because of concerns with his arterial duplex study.  The previous duplex was reviewed with Dr. Imogene Burn and it shows improvement in blood flow to his right LE.  The patient is asymptomatic and walking with a prosthetic on the left LE. He will follow up in 6 months Kinzie Wickes MAUREEN PA-C  Addendum  I have independently interviewed and examined the patient, and I agree with the physician assistant's findings.  Pt is asx from his R leg.  ABI is slightly better than previous.  He was mistakenly scheduled for an appt today.  He will be seen in the office at his regular scheduled appt.  [name], MD [facility] Office: [phone] Pager: [phone]  11/09/2012, 7:28 AM

## 2012-11-09 ENCOUNTER — Encounter: Payer: Self-pay | Admitting: Vascular Surgery

## 2012-11-10 ENCOUNTER — Ambulatory Visit: Payer: Medicare HMO | Admitting: Physical Therapy

## 2012-11-13 ENCOUNTER — Ambulatory Visit: Payer: Medicare HMO | Admitting: Physical Therapy

## 2012-11-18 ENCOUNTER — Ambulatory Visit: Payer: Medicare HMO | Admitting: Physical Therapy

## 2012-11-20 ENCOUNTER — Ambulatory Visit: Payer: Medicare HMO | Admitting: Physical Therapy

## 2012-11-25 ENCOUNTER — Ambulatory Visit: Payer: Medicare HMO | Admitting: Physical Therapy

## 2012-11-30 ENCOUNTER — Ambulatory Visit: Payer: Medicare HMO | Admitting: Physical Therapy

## 2012-12-01 ENCOUNTER — Ambulatory Visit (INDEPENDENT_AMBULATORY_CARE_PROVIDER_SITE_OTHER): Payer: Medicare HMO | Admitting: Cardiology

## 2012-12-01 ENCOUNTER — Encounter: Payer: Self-pay | Admitting: Cardiology

## 2012-12-01 VITALS — BP 132/90 | HR 70 | Ht 69.0 in | Wt 312.0 lb

## 2012-12-01 DIAGNOSIS — I428 Other cardiomyopathies: Secondary | ICD-10-CM

## 2012-12-01 DIAGNOSIS — I4891 Unspecified atrial fibrillation: Secondary | ICD-10-CM

## 2012-12-01 DIAGNOSIS — I2510 Atherosclerotic heart disease of native coronary artery without angina pectoris: Secondary | ICD-10-CM

## 2012-12-01 DIAGNOSIS — I498 Other specified cardiac arrhythmias: Secondary | ICD-10-CM

## 2012-12-01 DIAGNOSIS — I471 Supraventricular tachycardia: Secondary | ICD-10-CM

## 2012-12-01 MED ORDER — LISINOPRIL 5 MG PO TABS: 5.0000 mg | ORAL_TABLET | Freq: Every day | ORAL | Status: DC

## 2012-12-01 NOTE — Patient Instructions (Addendum)
Please take Lisinopril 5 mg a day, Stop ASA Continue all other medications as listed.  Follow up in 1 month with Tereso Newcomer, PA

## 2012-12-01 NOTE — Progress Notes (Signed)
HPI The patient returns for followup of atrial fibrillation, ischemic cardiomyopathy, CAD with CABG   He had a non healing leg wound and he is s/p left BKA. Since last saw him he was seen by our service for treatment of an SVT that happened after he ran out of medications. Since I last saw him he has done fairly well. He is now walking with his prosthesis. He denies any chest pressure, neck or arm discomfort. He's not noticing any of the palpitations that he had previously. He's had no presyncope or syncope. He is tolerating systemic anticoagulation.  Allergies  Allergen Reactions  . Statins     Muscle aches  . Amlodipine Besylate     Muscle aches making difficult to walk  . Cephalexin     REACTION: Hives    Current Outpatient Prescriptions  Medication Sig Dispense Refill  . Alcohol Swabs (B-D SINGLE USE SWABS REGULAR) PADS Use As Instructed to test Blood Glucose Twice Daily. Dx: 250.62  200 each  1  . aspirin EC 325 MG tablet Take 325 mg by mouth daily.      . Blood Glucose Monitoring Suppl (PRODIGY AUTOCODE BLOOD GLUCOSE) W/DEVICE KIT Use As Instructed to test Blood Glucose Twice Daily. Dx: 250.62  1 each  0  . colesevelam (WELCHOL) 625 MG tablet Take 3 tablets (1,875 mg total) by mouth 2 (two) times daily with a meal.  120 tablet  11  . glucose blood (PRODIGY TEST) test strip Use As Instructed to test Blood Glucose Twice Daily. Dx: 250.62  200 each  1  . insulin aspart (NOVOLOG) 100 UNIT/ML injection Inject 5 Units into the skin 3 (three) times daily as needed for high blood sugar.      . insulin glargine (LANTUS SOLOSTAR) 100 UNIT/ML injection Inject 50 Units into the skin 2 (two) times daily. Please dispense a 90 day supply with refills to complete the year  10 pen  2  . Insulin Pen Needle 31G X 6 MM MISC Use As Instructed to test Blood Glucose Twice Daily. Dx: 250.62  200 each  1  . lisinopril (PRINIVIL,ZESTRIL) 2.5 MG tablet Take 1 tablet (2.5 mg total) by mouth daily.  30 tablet  0   . metoprolol succinate (TOPROL-XL) 50 MG 24 hr tablet Take 1.5 tablets by mouth daily.      . nitroGLYCERIN (NITROSTAT) 0.4 MG SL tablet Place 1 tablet (0.4 mg total) under the tongue every 5 (five) minutes as needed.  25 tablet  11  . PRODIGY TWIST TOP LANCETS 28G MISC Use As Instructed to test Blood Glucose Twice Daily. Dx: 250.62  200 each  1  . Rivaroxaban (XARELTO) 20 MG TABS Take 20 mg by mouth daily.       No current facility-administered medications for this visit.    Past Medical History  Diagnosis Date  . Hyperlipidemia   . Ischemic cardiomyopathy     a. EF 40% 2010. b. Echo 05/2012: EF 30-35%.  . Chronic anticoagulation   . Cataract   . Hypotension   . Paroxysmal atrial fibrillation   . Coronary artery disease     a. s/p remote CABG 2001 at [facility] b. Cath 03/2010: for med rx.  . Peripheral vascular disease     a. s/p L BKA 2014.  [name] History of blood clots     in legs--this was in 2011--has been off of Coumadin 7months;takes Xarelto daily  . Peripheral neuropathy   . Joint pain   .  H/O hiatal hernia   . Chronic kidney disease     on Lisinopril to protect kidneys d/t being on Metformin per pt  . Diabetes mellitus     takes Metformni daily  and Lantus 50units bid  . Myocardial infarction     total of 8 heart attacks;last one in 2011  . Atrial flutter   . H/O medication noncompliance     Due to insurance issues    Past Surgical History  Procedure Laterality Date  . Revasculariztion  2011/2010    x 2   . Coronary artery bypass graft  03/2000    quadruple  . Tonsillectomy    . Hernia repair    . Cardiac catheterization  03/19/10  . Abdominal aortagram  04-30-12  . Coronary angioplasty      3 stents  . Adenoidectomy    . Amputation Left 06/03/2012    Procedure: AMPUTATION BELOW KNEE;  Surgeon: [name], MD;  Location[facility];  Service: Vascular;  Laterality: Left;    ROS:  As stated in the HPI and negative for all other systems.  PHYSICAL EXAM BP 132/90   Pulse 70  Ht 5\' 9"  (1.753 m)  Wt 312 lb (141.522 kg)  BMI 46.05 kg/m2 GEN:  No distress NECK:  No jugular venous distention at 45 degrees, waveform within normal limits, carotid upstroke brisk and symmetric, no bruits, no thyromegaly LYMPHATICS:  No cervical adenopathy LUNGS:  Clear to auscultation bilaterally BACK:  No CVA tenderness CHEST:  Unremarkable HEART:  S1 and S2 within normal limits, no S3, no S4, no clicks, no rubs, no murmurs ABD:  Positive bowel sounds normal in frequency in pitch, no bruits, no rebound, no guarding, unable to assess midline mass or bruit, obese EXT:  2 plus pulses lower, absent DP and PT, non healing foot ulcer, moderate edema, no cyanosis no clubbing SKIN:  No rashes no nodules NEURO:  Cranial nerves II through XII grossly intact, motor grossly intact throughout PSYCH:  Cognitively intact, oriented to person place and time   EKG:  Normal sinus rhythm, rate 70 borderline interventricular conduction delay, old inferior infarct, lateral T-wave inversions unchanged from previous. 12/01/2012   ASSESSMENT AND PLAN   CAD  He has no new symptoms. We will continue aggressive risk reduction.  ATRIAL FIBRILLATION, PAROXYSMAL  The patient is currently in sinus rhythm. He tolerates anticoagulation. No change in therapy is planned.  CARDIOMYOPATHY  He seems to be euvolemic.  I will restart low-dose ACE inhibitor. This looks like it was discontinued because of some low blood pressures and his blood pressure is still fluctuating. However, we will start this and follow this closely.  DM  His diabetes is very poorly controlled but the A1c is now down to 8.3. This will be followed by his primary provider.  DYSLIPIDEMIA His LDL recently was 191.7. However, he says he cannot take any of the statins.   OBESITY We discussed weight loss.

## 2012-12-02 ENCOUNTER — Ambulatory Visit: Payer: Medicare HMO | Admitting: Physical Therapy

## 2012-12-08 ENCOUNTER — Ambulatory Visit: Payer: Medicare HMO | Attending: Vascular Surgery | Admitting: Physical Therapy

## 2012-12-08 DIAGNOSIS — R5381 Other malaise: Secondary | ICD-10-CM | POA: Insufficient documentation

## 2012-12-08 DIAGNOSIS — R269 Unspecified abnormalities of gait and mobility: Secondary | ICD-10-CM | POA: Insufficient documentation

## 2012-12-08 DIAGNOSIS — Z5189 Encounter for other specified aftercare: Secondary | ICD-10-CM | POA: Insufficient documentation

## 2012-12-10 ENCOUNTER — Ambulatory Visit: Payer: Medicare HMO | Admitting: Physical Therapy

## 2012-12-14 ENCOUNTER — Ambulatory Visit: Payer: Medicare HMO | Admitting: Physical Therapy

## 2012-12-16 ENCOUNTER — Ambulatory Visit: Payer: Medicare HMO | Admitting: Physical Therapy

## 2012-12-21 ENCOUNTER — Ambulatory Visit: Payer: Medicare HMO | Admitting: Physical Therapy

## 2012-12-23 ENCOUNTER — Ambulatory Visit: Payer: Medicare HMO | Admitting: Physical Therapy

## 2012-12-28 ENCOUNTER — Ambulatory Visit: Payer: Medicare HMO | Admitting: Physical Therapy

## 2012-12-30 ENCOUNTER — Ambulatory Visit: Payer: Medicare HMO | Admitting: Physical Therapy

## 2013-01-05 ENCOUNTER — Ambulatory Visit (INDEPENDENT_AMBULATORY_CARE_PROVIDER_SITE_OTHER): Payer: Commercial Managed Care - HMO | Admitting: Physician Assistant

## 2013-01-05 ENCOUNTER — Encounter: Payer: Self-pay | Admitting: Physician Assistant

## 2013-01-05 VITALS — BP 126/84 | HR 87 | Wt 310.0 lb

## 2013-01-05 DIAGNOSIS — I2510 Atherosclerotic heart disease of native coronary artery without angina pectoris: Secondary | ICD-10-CM

## 2013-01-05 DIAGNOSIS — I5022 Chronic systolic (congestive) heart failure: Secondary | ICD-10-CM

## 2013-01-05 DIAGNOSIS — E7849 Other hyperlipidemia: Secondary | ICD-10-CM

## 2013-01-05 DIAGNOSIS — I10 Essential (primary) hypertension: Secondary | ICD-10-CM

## 2013-01-05 DIAGNOSIS — I4891 Unspecified atrial fibrillation: Secondary | ICD-10-CM

## 2013-01-05 DIAGNOSIS — E785 Hyperlipidemia, unspecified: Secondary | ICD-10-CM

## 2013-01-05 LAB — BASIC METABOLIC PANEL
CO2: 21 mEq/L (ref 19–32)
Calcium: 8.6 mg/dL (ref 8.4–10.5)
Chloride: 106 mEq/L (ref 96–112)
Potassium: 4.1 mEq/L (ref 3.5–5.1)
Sodium: 136 mEq/L (ref 135–145)

## 2013-01-05 NOTE — Patient Instructions (Addendum)
Your physician recommends that you continue on your current medications as directed. Please refer to the Current Medication list given to you today.  Labs today Bmet  Your physician recommends that you schedule a follow-up appointment in: 3 months with Tereso Newcomer, PA  Your physician recommends that you schedule a follow-up appointment in: 6 months with Dr.Hochrein

## 2013-01-05 NOTE — Progress Notes (Signed)
[address], [address] [place], [place]  [address] Phone: [phone] Fax:  [phone]  Date:  01/05/2013   ID:  Steele, Stracener 1945-05-22, MRN [id number]  PCP:  [name], MD  Primary Cardiologist:  Dr. Rollene Rotunda     History of Present Illness: Leroy [name] is a 67 y.o. male who returns for follow up.  He has a hx of CAD, s/p CABG in 2001, DM2, HL, atrial fibrillation, ischemic cardiomyopathy, systolic CHF. LHC 12/11: SVG-OM patent, SVG-RCA occluded, native RCA with severe diffuse disease and chronic total occlusion, LIMA-LAD patent, medical therapy pursued.  Echo 10/13:  EF 35-40%.  Echo (2/14):  mild LVH, EF 30-35%, diff HK, Gr 1 DD, MAC, mild MR, mod LAE, PASP 39.  Lexiscan myoview (1/14):  Large Inferior apical MI, very small area of peri-infarct ischemia toward the inferior apical segment. EF 19%.  Med Rx was continued.  He had a non-healing leg wound and is s/p L BKA.  He was evaluated in the hospital in 05/2012 with SVT while off of medications. Beta blocker therapy was reinitiated. He was last seen by Dr. Antoine Poche 11/2012. ACE inhibitor was added back to his medical regimen. He was brought back today for follow up.  He is doing well.  Still getting used to his LLE prosthesis.  The patient denies chest pain, significant shortness of breath, syncope, orthopnea, PND or RLE edema.   Labs (1/14):   K4.4, creatinine 1.7, Hgb 16.8 Labs (4/14):   K 4, creatinine 1.26, ALT 14, pro BNP 688, Hgb 15.8 Labs (6/14):   K 4, creatinine 1.4  Wt Readings from Last 3 Encounters:  01/05/13 310 lb (140.615 kg)  12/01/12 312 lb (141.522 kg)  11/06/12 309 lb (140.161 kg)     Past Medical History  Diagnosis Date  . Hyperlipidemia   . Ischemic cardiomyopathy     a. EF 40% 2010. b. Echo (2/14):  mild LVH, EF 30-35%, diff HK, Gr 1 DD, MAC, mild MR, mod LAE, PASP 39  . Chronic anticoagulation   . Cataract   . Hypotension   . Paroxysmal atrial fibrillation   . Peripheral  vascular disease     a. s/p L BKA 2014.  [name] History of blood clots     in legs--this was in 2011--has been off of Coumadin 69months;takes Xarelto daily  . Peripheral neuropathy   . Joint pain   . H/O hiatal hernia   . Chronic kidney disease     on Lisinopril to protect kidneys d/t being on Metformin per pt  . Diabetes mellitus     takes Metformni daily  and Lantus 50units bid  . Myocardial infarction     total of 8 heart attacks;last one in 2011  . Atrial flutter   . H/O medication noncompliance     Due to insurance issues  . Chronic systolic CHF (congestive heart failure)   . Coronary artery disease     a. s/p remote CABG 2001 at [facility] b. Cath 03/2010: for med rx.;  c.  [name] myoview (1/14):  Large Inferior apical MI, very small area of peri-infarct ischemia toward the inferior apical segment. EF 19%.  Med Rx was continued.      Current Outpatient Prescriptions  Medication Sig Dispense Refill  . Alcohol Swabs (B-D SINGLE USE SWABS REGULAR) PADS Use As Instructed to test Blood Glucose Twice Daily. Dx: 250.62  200 each  1  . Blood Glucose Monitoring Suppl (PRODIGY AUTOCODE BLOOD  GLUCOSE) W/DEVICE KIT Use As Instructed to test Blood Glucose Twice Daily. Dx: 250.62  1 each  0  . colesevelam (WELCHOL) 625 MG tablet Take 3 tablets (1,875 mg total) by mouth 2 (two) times daily with a meal.  120 tablet  11  . glucose blood (PRODIGY TEST) test strip Use As Instructed to test Blood Glucose Twice Daily. Dx: 250.62  200 each  1  . insulin aspart (NOVOLOG) 100 UNIT/ML injection Inject 5 Units into the skin 3 (three) times daily as needed for high blood sugar.      . insulin glargine (LANTUS SOLOSTAR) 100 UNIT/ML injection Inject 50 Units into the skin 2 (two) times daily. Please dispense a 90 day supply with refills to complete the year  10 pen  2  . Insulin Pen Needle 31G X 6 MM MISC Use As Instructed to test Blood Glucose Twice Daily. Dx: 250.62  200 each  1  . lisinopril (PRINIVIL,ZESTRIL) 5 MG  tablet Take 1 tablet (5 mg total) by mouth daily.  30 tablet  11  . metoprolol succinate (TOPROL-XL) 50 MG 24 hr tablet Take 1.5 tablets by mouth daily.      . nitroGLYCERIN (NITROSTAT) 0.4 MG SL tablet Place 1 tablet (0.4 mg total) under the tongue every 5 (five) minutes as needed.  25 tablet  11  . PRODIGY TWIST TOP LANCETS 28G MISC Use As Instructed to test Blood Glucose Twice Daily. Dx: 250.62  200 each  1  . Rivaroxaban (XARELTO) 20 MG TABS Take 20 mg by mouth daily.       No current facility-administered medications for this visit.    Allergies:    Allergies  Allergen Reactions  . Statins     Muscle aches  . Amlodipine Besylate     Muscle aches making difficult to walk  . Cephalexin     REACTION: Hives    Social History:  The patient  reports that he has quit smoking. His smoking use included Cigarettes. He smoked 0.00 packs per day. He has never used smokeless tobacco. He reports that he does not drink alcohol or use illicit drugs.   ROS:  Please see the history of present illness.   Denies bleeding problems.   All other systems reviewed and negative.   PHYSICAL EXAM: VS:  BP 126/84  Pulse 87  Wt 310 lb (140.615 kg)  BMI 45.76 kg/m2 Well nourished, well developed, in no acute distress HEENT: normal Neck: no JVD at 90 Cardiac:  normal S1, S2; RRR; no murmur Lungs:  Decreased breath sounds bilaterally, no wheezing, rhonchi or rales Abd: soft, nontender, no hepatomegaly Ext: trace right LE edema Skin: warm and dry Neuro:  CNs 2-12 intact, no focal abnormalities noted  EKG:  NSR, HR 87, poor R-wave progression, lateral T wave inversions, no significant change when compared to prior tracings    ASSESSMENT AND PLAN:  1. Coronary Artery Disease:  No angina. He is not on aspirin as he is on Xarelto. He is intolerant to statins. 2. Hypertension:  Controlled. Check a followup basic metabolic panel today given recent initiation of ACE inhibitor. 3. Hyperlipidemia:  He is  intolerant to statins. Continue WelChol. 4. Chronic Systolic CHF:  Volume stable. Continue current therapy. 5. Atrial Fibrillation:  Maintaining sinus rhythm. Continue Xarelto. Check a basic metabolic panel today to ensure stable renal function. 6. Disposition:  Followup in 3 months with me in 6 months with Dr. Antoine Poche.  Signed, Tereso Newcomer, PA-C  11:32 AM  01/05/2013    

## 2013-01-06 ENCOUNTER — Encounter: Payer: Self-pay | Admitting: Internal Medicine

## 2013-01-06 ENCOUNTER — Ambulatory Visit (INDEPENDENT_AMBULATORY_CARE_PROVIDER_SITE_OTHER): Payer: Medicare HMO | Admitting: Internal Medicine

## 2013-01-06 ENCOUNTER — Ambulatory Visit (INDEPENDENT_AMBULATORY_CARE_PROVIDER_SITE_OTHER): Payer: Medicare HMO

## 2013-01-06 VITALS — BP 110/78 | HR 88 | Temp 96.6°F | Resp 16 | Ht 69.0 in | Wt 310.0 lb

## 2013-01-06 DIAGNOSIS — E785 Hyperlipidemia, unspecified: Secondary | ICD-10-CM

## 2013-01-06 DIAGNOSIS — E1149 Type 2 diabetes mellitus with other diabetic neurological complication: Secondary | ICD-10-CM

## 2013-01-06 DIAGNOSIS — E7849 Other hyperlipidemia: Secondary | ICD-10-CM

## 2013-01-06 DIAGNOSIS — Z23 Encounter for immunization: Secondary | ICD-10-CM

## 2013-01-06 DIAGNOSIS — E11319 Type 2 diabetes mellitus with unspecified diabetic retinopathy without macular edema: Secondary | ICD-10-CM

## 2013-01-06 NOTE — Patient Instructions (Signed)
Type 2 Diabetes Mellitus, Adult Type 2 diabetes mellitus, often simply referred to as type 2 diabetes, is a long-lasting (chronic) disease. In type 2 diabetes, the pancreas does not make enough insulin (a hormone), the cells are less responsive to the insulin that is made (insulin resistance), or both. Normally, insulin moves sugars from food into the tissue cells. The tissue cells use the sugars for energy. The lack of insulin or the lack of normal response to insulin causes excess sugars to build up in the blood instead of going into the tissue cells. As a result, high blood sugar (hyperglycemia) develops. The effect of high sugar (glucose) levels can cause many complications. Type 2 diabetes was also previously called adult-onset diabetes but it can occur at any age.  RISK FACTORS  A person is predisposed to developing type 2 diabetes if someone in the family has the disease and also has one or more of the following primary risk factors:  Overweight.  An inactive lifestyle.  A history of consistently eating high-calorie foods. Maintaining a normal weight and regular physical activity can reduce the chance of developing type 2 diabetes. SYMPTOMS  A person with type 2 diabetes may not show symptoms initially. The symptoms of type 2 diabetes appear slowly. The symptoms include:  Increased thirst (polydipsia).  Increased urination (polyuria).  Increased urination during the night (nocturia).  Weight loss. This weight loss may be rapid.  Frequent, recurring infections.  Tiredness (fatigue).  Weakness.  Vision changes, such as blurred vision.  Fruity smell to your breath.  Abdominal pain.  Nausea or vomiting.  Cuts or bruises which are slow to heal.  Tingling or numbness in the hands or feet. DIAGNOSIS Type 2 diabetes is frequently not diagnosed until complications of diabetes are present. Type 2 diabetes is diagnosed when symptoms or complications are present and when blood  glucose levels are increased. Your blood glucose level may be checked by one or more of the following blood tests:  A fasting blood glucose test. You will not be allowed to eat for at least 8 hours before a blood sample is taken.  A random blood glucose test. Your blood glucose is checked at any time of the day regardless of when you ate.  A hemoglobin A1c blood glucose test. A hemoglobin A1c test provides information about blood glucose control over the previous 3 months.  An oral glucose tolerance test (OGTT). Your blood glucose is measured after you have not eaten (fasted) for 2 hours and then after you drink a glucose-containing beverage. TREATMENT   You may need to take insulin or diabetes medicine daily to keep blood glucose levels in the desired range.  You will need to match insulin dosing with exercise and healthy food choices. The treatment goal is to maintain the before meal blood sugar (preprandial glucose) level at 70 130 mg/dL. HOME CARE INSTRUCTIONS   Have your hemoglobin A1c level checked twice a year.  Perform daily blood glucose monitoring as directed by your caregiver.  Monitor urine ketones when you are ill and as directed by your caregiver.  Take your diabetes medicine or insulin as directed by your caregiver to maintain your blood glucose levels in the desired range.  Never run out of diabetes medicine or insulin. It is needed every day.  Adjust insulin based on your intake of carbohydrates. Carbohydrates can raise blood glucose levels but need to be included in your diet. Carbohydrates provide vitamins, minerals, and fiber which are an essential part of   a healthy diet. Carbohydrates are found in fruits, vegetables, whole grains, dairy products, legumes, and foods containing added sugars.    Eat healthy foods. Alternate 3 meals with 3 snacks.  Lose weight if overweight.  Carry a medical alert card or wear your medical alert jewelry.  Carry a 15 gram  carbohydrate snack with you at all times to treat low blood glucose (hypoglycemia). Some examples of 15 gram carbohydrate snacks include:  Glucose tablets, 3 or 4   Glucose gel, 15 gram tube  Raisins, 2 tablespoons (24 grams)  Jelly beans, 6  Animal crackers, 8  Regular pop, 4 ounces (120 mL)  Gummy treats, 9  Recognize hypoglycemia. Hypoglycemia occurs with blood glucose levels of 70 mg/dL and below. The risk for hypoglycemia increases when fasting or skipping meals, during or after intense exercise, and during sleep. Hypoglycemia symptoms can include:  Tremors or shakes.  Decreased ability to concentrate.  Sweating.  Increased heart rate.  Headache.  Dry mouth.  Hunger.  Irritability.  Anxiety.  Restless sleep.  Altered speech or coordination.  Confusion.  Treat hypoglycemia promptly. If you are alert and able to safely swallow, follow the 15:15 rule:  Take 15 20 grams of rapid-acting glucose or carbohydrate. Rapid-acting options include glucose gel, glucose tablets, or 4 ounces (120 mL) of fruit juice, regular soda, or low fat milk.  Check your blood glucose level 15 minutes after taking the glucose.  Take 15 20 grams more of glucose if the repeat blood glucose level is still 70 mg/dL or below.  Eat a meal or snack within 1 hour once blood glucose levels return to normal.    Be alert to polyuria and polydipsia which are early signs of hyperglycemia. An early awareness of hyperglycemia allows for prompt treatment. Treat hyperglycemia as directed by your caregiver.  Engage in at least 150 minutes of moderate-intensity physical activity a week, spread over at least 3 days of the week or as directed by your caregiver. In addition, you should engage in resistance exercise at least 2 times a week or as directed by your caregiver.  Adjust your medicine and food intake as needed if you start a new exercise or sport.  Follow your sick day plan at any time you  are unable to eat or drink as usual.  Avoid tobacco use.  Limit alcohol intake to no more than 1 drink per day for nonpregnant women and 2 drinks per day for men. You should drink alcohol only when you are also eating food. Talk with your caregiver whether alcohol is safe for you. Tell your caregiver if you drink alcohol several times a week.  Follow up with your caregiver regularly.  Schedule an eye exam soon after the diagnosis of type 2 diabetes and then annually.  Perform daily skin and foot care. Examine your skin and feet daily for cuts, bruises, redness, nail problems, bleeding, blisters, or sores. A foot exam by a caregiver should be done annually.  Brush your teeth and gums at least twice a day and floss at least once a day. Follow up with your dentist regularly.  Share your diabetes management plan with your workplace or school.  Stay up-to-date with immunizations.  Learn to manage stress.  Obtain ongoing diabetes education and support as needed.  Participate in, or seek rehabilitation as needed to maintain or improve independence and quality of life. Request a physical or occupational therapy referral if you are having foot or hand numbness or difficulties with grooming,   dressing, eating, or physical activity. SEEK MEDICAL CARE IF:   You are unable to eat food or drink fluids for more than 6 hours.  You have nausea and vomiting for more than 6 hours.  Your blood glucose level is over 240 mg/dL.  There is a change in mental status.  You develop an additional serious illness.  You have diarrhea for more than 6 hours.  You have been sick or have had a fever for a couple of days and are not getting better.  You have pain during any physical activity.  SEEK IMMEDIATE MEDICAL CARE IF:  You have difficulty breathing.  You have moderate to large ketone levels. MAKE SURE YOU:  Understand these instructions.  Will watch your condition.  Will get help right away if  you are not doing well or get worse. Document Released: 03/25/2005 Document Revised: 12/18/2011 Document Reviewed: 10/22/2011 ExitCare Patient Information 2014 [employer].  

## 2013-01-06 NOTE — Progress Notes (Signed)
Subjective:    Patient ID: Leroy [name], male    DOB: 27-May-1945, 67 y.o.   MRN: [id number]  Diabetes He presents for his follow-up diabetic visit. He has type 2 diabetes mellitus. His disease course has been stable. There are no hypoglycemic associated symptoms. Associated symptoms include foot paresthesias. Pertinent negatives for diabetes include no blurred vision, no chest pain, no fatigue, no foot ulcerations, no polydipsia, no polyphagia, no polyuria, no visual change, no weakness and no weight loss. There are no hypoglycemic complications. Diabetic complications include heart disease and PVD. Current diabetic treatment includes insulin injections and oral agent (monotherapy). He is compliant with treatment most of the time. His weight is stable. He is following a generally unhealthy diet. When asked about meal planning, he reported none. He has not had a previous visit with a dietician. He never participates in exercise. His breakfast blood glucose range is generally 130-140 mg/dl. His lunch blood glucose range is generally 140-180 mg/dl. His dinner blood glucose range is generally 140-180 mg/dl. His highest blood glucose is 140-180 mg/dl. His overall blood glucose range is 140-180 mg/dl. An ACE inhibitor/angiotensin II receptor blocker is being taken. He does not see a podiatrist.Eye exam is not current.      Review of Systems  Constitutional: Negative.  Negative for fever, chills, weight loss, diaphoresis, appetite change and fatigue.  HENT: Negative.   Eyes: Negative.  Negative for blurred vision.  Respiratory: Negative.  Negative for cough, chest tightness, shortness of breath, wheezing and stridor.   Cardiovascular: Negative.  Negative for chest pain, palpitations and leg swelling.  Gastrointestinal: Negative.  Negative for nausea, vomiting, abdominal pain, diarrhea, constipation and blood in stool.  Endocrine: Negative.  Negative for polydipsia, polyphagia and polyuria.   Genitourinary: Negative.   Musculoskeletal: Positive for arthralgias. Negative for myalgias, back pain, joint swelling and gait problem.  Skin: Negative.   Allergic/Immunologic: Negative.   Neurological: Negative.  Negative for weakness.  Hematological: Negative.  Negative for adenopathy. Does not bruise/bleed easily.  Psychiatric/Behavioral: Negative.        Objective:   Physical Exam  Vitals reviewed. Constitutional: He is oriented to person, place, and time. He appears well-developed and well-nourished. No distress.  HENT:  Head: Normocephalic and atraumatic.  Mouth/Throat: Oropharynx is clear and moist. No oropharyngeal exudate.  Eyes: Conjunctivae are normal. Right eye exhibits no discharge. Left eye exhibits no discharge. No scleral icterus.  Neck: Normal range of motion. Neck supple. No JVD present. No tracheal deviation present. No thyromegaly present.  Cardiovascular: Normal rate, regular rhythm, normal heart sounds and intact distal pulses.  Exam reveals no gallop and no friction rub.   No murmur heard. Pulmonary/Chest: Effort normal and breath sounds normal. No stridor. No respiratory distress. He has no wheezes. He has no rales. He exhibits no tenderness.  Abdominal: Soft. Bowel sounds are normal. He exhibits no distension and no mass. There is no tenderness. There is no rebound and no guarding.  Musculoskeletal: Normal range of motion. He exhibits edema (2+ edema in his RLE). He exhibits no tenderness.  Lymphadenopathy:    He has no cervical adenopathy.  Neurological: He is oriented to person, place, and time.  Skin: Skin is warm and dry. No rash noted. He is not diaphoretic. No erythema. No pallor.  Psychiatric: He has a normal mood and affect. His behavior is normal. Judgment and thought content normal.     Lab Results  Component Value Date   WBC 5.2 08/04/2012   HGB  15.8 08/04/2012   HCT 44.0 08/04/2012   PLT 182 08/04/2012   GLUCOSE 216* 01/05/2013   CHOL 263*  01/02/2012   TRIG 172.0* 01/02/2012   HDL 42.60 01/02/2012   LDLDIRECT 191.7 01/02/2012   LDLCALC  Value: 182        Total Cholesterol/HDL:CHD Risk Coronary Heart Disease Risk Table                     Men   Women  1/2 Average Risk   3.4   3.3  Average Risk       5.0   4.4  2 X Average Risk   9.6   7.1  3 X Average Risk  23.4   11.0        Use the calculated Patient Ratio above and the CHD Risk Table to determine the patient's CHD Risk.        ATP III CLASSIFICATION (LDL):  <100     mg/dL   Optimal  161-096  mg/dL   Near or Above                    Optimal  130-159  mg/dL   Borderline  045-409  mg/dL   High  >811     mg/dL   Very High* 91/47/8295   ALT 14 08/04/2012   AST 16 08/04/2012   NA 136 01/05/2013   K 4.1 Hemolyzed 01/05/2013   CL 106 01/05/2013   CREATININE 1.2 01/05/2013   BUN 12 01/05/2013   CO2 21 01/05/2013   TSH 0.99 01/02/2012   INR 2.01* 08/04/2012   HGBA1C 8.3* 09/10/2012       Assessment & Plan:

## 2013-01-07 ENCOUNTER — Telehealth: Payer: Self-pay | Admitting: *Deleted

## 2013-01-07 LAB — COMPREHENSIVE METABOLIC PANEL
ALT: 20 U/L (ref 0–53)
CO2: 25 mEq/L (ref 19–32)
Creatinine, Ser: 1.3 mg/dL (ref 0.4–1.5)
GFR: 69.79 mL/min (ref >60.00)
Total Bilirubin: 0.4 mg/dL (ref 0.3–1.2)

## 2013-01-07 LAB — LIPID PANEL
Cholesterol: 256 mg/dL — ABNORMAL HIGH (ref 0–200)
Total CHOL/HDL Ratio: 6
Triglycerides: 189 mg/dL — ABNORMAL HIGH (ref 0.0–149.0)
VLDL: 37.8 mg/dL (ref 0.0–40.0)

## 2013-01-07 NOTE — Assessment & Plan Note (Addendum)
I will recheck his A1C and will address if needed He was referred for an eye exam  Late note - his A1C is high so I have asked him to restart kombiglyze

## 2013-01-07 NOTE — Assessment & Plan Note (Addendum)
He will not take a statin I will recheck his [facility] TSH CMP today  Late note - his LDL is high so I have asked him to start zetia

## 2013-01-07 NOTE — Telephone Encounter (Signed)
pt notified about the lab results from 9/30

## 2013-01-11 ENCOUNTER — Encounter: Payer: Self-pay | Admitting: Internal Medicine

## 2013-01-11 ENCOUNTER — Other Ambulatory Visit: Payer: Commercial Managed Care - HMO

## 2013-01-11 LAB — LDL CHOLESTEROL, DIRECT: Direct LDL: 206.8 mg/dL

## 2013-01-11 MED ORDER — EZETIMIBE 10 MG PO TABS: 10.0000 mg | ORAL_TABLET | Freq: Every day | ORAL | Status: DC

## 2013-01-11 MED ORDER — SAXAGLIPTIN-METFORMIN ER 2.5-1000 MG PO TB24: 1.0000 | ORAL_TABLET | Freq: Two times a day (BID) | ORAL | Status: DC

## 2013-01-11 NOTE — Addendum Note (Signed)
Addended by: Etta Grandchild on: 01/11/2013 12:38 PM   Modules accepted: Orders

## 2013-01-13 ENCOUNTER — Telehealth: Payer: Self-pay

## 2013-01-13 NOTE — Telephone Encounter (Signed)
Received fax from [facility] approving Kombiglyze XR 2.08-998 tab. Approval form faxed to pharmacy.

## 2013-01-13 NOTE — Telephone Encounter (Signed)
Received pharmacy rejection for Kombiglyze XR stating that insurance will not cover without a prior authorization. Waiting for questionnaire form  to be faxed to office.

## 2013-02-08 ENCOUNTER — Telehealth: Payer: Self-pay | Admitting: Internal Medicine

## 2013-02-08 MED ORDER — INSULIN GLARGINE 100 UNIT/ML ~~LOC~~ SOLN: 50.0000 [IU] | Freq: Two times a day (BID) | SUBCUTANEOUS | Status: DC

## 2013-02-08 MED ORDER — METOPROLOL SUCCINATE ER 50 MG PO TB24: 50.0000 mg | ORAL_TABLET | Freq: Two times a day (BID) | ORAL | Status: DC

## 2013-02-08 NOTE — Telephone Encounter (Signed)
Patient is requesting refills on Metoprolol and Lantus.  Pharmacy is [facility]

## 2013-03-05 ENCOUNTER — Encounter (HOSPITAL_COMMUNITY): Payer: Self-pay | Admitting: Emergency Medicine

## 2013-03-05 ENCOUNTER — Emergency Department (HOSPITAL_COMMUNITY)
Admission: EM | Admit: 2013-03-05 | Discharge: 2013-03-06 | Disposition: A | Discharge status: Home / Self Care | Payer: Medicare HMO | Attending: Emergency Medicine | Admitting: Emergency Medicine

## 2013-03-05 DIAGNOSIS — I4892 Unspecified atrial flutter: Secondary | ICD-10-CM | POA: Insufficient documentation

## 2013-03-05 DIAGNOSIS — Z9119 Patient's noncompliance with other medical treatment and regimen: Secondary | ICD-10-CM | POA: Insufficient documentation

## 2013-03-05 DIAGNOSIS — I5022 Chronic systolic (congestive) heart failure: Secondary | ICD-10-CM | POA: Insufficient documentation

## 2013-03-05 DIAGNOSIS — G8929 Other chronic pain: Secondary | ICD-10-CM | POA: Insufficient documentation

## 2013-03-05 DIAGNOSIS — I739 Peripheral vascular disease, unspecified: Secondary | ICD-10-CM | POA: Insufficient documentation

## 2013-03-05 DIAGNOSIS — Z86718 Personal history of other venous thrombosis and embolism: Secondary | ICD-10-CM | POA: Insufficient documentation

## 2013-03-05 DIAGNOSIS — Z87891 Personal history of nicotine dependence: Secondary | ICD-10-CM | POA: Insufficient documentation

## 2013-03-05 DIAGNOSIS — N189 Chronic kidney disease, unspecified: Secondary | ICD-10-CM | POA: Insufficient documentation

## 2013-03-05 DIAGNOSIS — I2510 Atherosclerotic heart disease of native coronary artery without angina pectoris: Secondary | ICD-10-CM | POA: Insufficient documentation

## 2013-03-05 DIAGNOSIS — Z91199 Patient's noncompliance with other medical treatment and regimen due to unspecified reason: Secondary | ICD-10-CM | POA: Insufficient documentation

## 2013-03-05 DIAGNOSIS — E119 Type 2 diabetes mellitus without complications: Secondary | ICD-10-CM | POA: Insufficient documentation

## 2013-03-05 DIAGNOSIS — Z8719 Personal history of other diseases of the digestive system: Secondary | ICD-10-CM | POA: Insufficient documentation

## 2013-03-05 DIAGNOSIS — I252 Old myocardial infarction: Secondary | ICD-10-CM | POA: Insufficient documentation

## 2013-03-05 DIAGNOSIS — S88119A Complete traumatic amputation at level between knee and ankle, unspecified lower leg, initial encounter: Secondary | ICD-10-CM | POA: Insufficient documentation

## 2013-03-05 DIAGNOSIS — Z95818 Presence of other cardiac implants and grafts: Secondary | ICD-10-CM | POA: Insufficient documentation

## 2013-03-05 DIAGNOSIS — Z951 Presence of aortocoronary bypass graft: Secondary | ICD-10-CM | POA: Insufficient documentation

## 2013-03-05 DIAGNOSIS — I4891 Unspecified atrial fibrillation: Secondary | ICD-10-CM | POA: Insufficient documentation

## 2013-03-05 DIAGNOSIS — M79609 Pain in unspecified limb: Secondary | ICD-10-CM

## 2013-03-05 DIAGNOSIS — R739 Hyperglycemia, unspecified: Secondary | ICD-10-CM

## 2013-03-05 DIAGNOSIS — Z794 Long term (current) use of insulin: Secondary | ICD-10-CM | POA: Insufficient documentation

## 2013-03-05 DIAGNOSIS — Z8669 Personal history of other diseases of the nervous system and sense organs: Secondary | ICD-10-CM | POA: Insufficient documentation

## 2013-03-05 DIAGNOSIS — M7989 Other specified soft tissue disorders: Secondary | ICD-10-CM

## 2013-03-05 DIAGNOSIS — E785 Hyperlipidemia, unspecified: Secondary | ICD-10-CM | POA: Insufficient documentation

## 2013-03-05 LAB — CBC
Hemoglobin: 16.7 g/dL (ref 13.0–17.0)
MCH: 31.2 pg (ref 26.0–34.0)
MCHC: 36.9 g/dL — ABNORMAL HIGH (ref 30.0–36.0)
Platelets: 191 10*3/uL (ref 150–400)
RBC: 5.35 MIL/uL (ref 4.22–5.81)
RDW: 12.5 % (ref 11.5–15.5)
WBC: 8.5 10*3/uL (ref 4.0–10.5)

## 2013-03-05 LAB — BASIC METABOLIC PANEL
BUN: 21 mg/dL (ref 6–23)
CO2: 22 mEq/L (ref 19–32)
Calcium: 9.9 mg/dL (ref 8.4–10.5)
Chloride: 97 mEq/L (ref 96–112)
Creatinine, Ser: 1.19 mg/dL (ref 0.50–1.35)
GFR calc non Af Amer: 62 mL/min — ABNORMAL LOW (ref >90)
Glucose, Bld: 372 mg/dL — ABNORMAL HIGH (ref 70–99)
Potassium: 4.3 mEq/L (ref 3.5–5.1)
Sodium: 131 mEq/L — ABNORMAL LOW (ref 135–145)

## 2013-03-05 MED ORDER — OXYCODONE-ACETAMINOPHEN 5-325 MG PO TABS
1.0000 | ORAL_TABLET | Freq: Once | ORAL | Status: AC
  Administered 2013-03-05: 1 via ORAL
  Filled 2013-03-05: qty 1

## 2013-03-05 MED ORDER — OXYCODONE-ACETAMINOPHEN 5-325 MG PO TABS: 1.0000 | ORAL_TABLET | Freq: Four times a day (QID) | ORAL | Status: DC | PRN

## 2013-03-05 NOTE — ED Notes (Signed)
Awaiting venous study,  Terri RN charge spoke with ultrasound and it will be a while,  Pt and family member aware

## 2013-03-05 NOTE — Progress Notes (Signed)
VASCULAR LAB PRELIMINARY  ARTERIAL  ABI completed:  Patient is a left BKA.  Right ABIs could not be ascertained secondary to barely audible Dorsalis Pedis and Posterior Tibial Doppler signals.       Ariz Terrones, RVS 03/05/2013, 11:49 PM

## 2013-03-05 NOTE — ED Notes (Signed)
Dr Karma Ganja used doppler and located pulse in right foot

## 2013-03-05 NOTE — ED Notes (Signed)
Patient reports that he has been having pain that felt like rocks in his right foot x 6 months ago and then 1 month ago he felt like his right foot was cold. Last week the patient stated that he used hot epsom salt water on his foot and thinks the water was too hot. Patient has swelling, redness, and increased pain.

## 2013-03-05 NOTE — Progress Notes (Signed)
VASCULAR LAB PRELIMINARY  PRELIMINARY  PRELIMINARY  PRELIMINARY  Right lower extremity venous duplex completed.    Preliminary report:  Right:  No evidence of DVT, superficial thrombosis, or Baker's cyst.  Antavious Spanos, RVS 03/05/2013, 11:14 PM

## 2013-03-05 NOTE — ED Notes (Addendum)
Patient sees a podiatrist at the [facility] center on [address], patient unsure of name.  He was last seen about 5 months ago.  He reports swelling and discoloration to right foot.  He is diabetic and has neuropathy in that foot.  No open sores on foot.  Patient can bear weight but it is painful to walk. Below the knee amputation of left leg in Feb of this year.

## 2013-03-05 NOTE — ED Provider Notes (Signed)
CSN: [id number]     Arrival date & time 03/05/13  1838 History   First MD Initiated Contact with Patient 03/05/13 1849     Chief Complaint  Patient presents with  . Foot Pain   (Consider location/radiation/quality/duration/timing/severity/associated sxs/prior Treatment) HPI Pt presents with c/o pain in right foot.  Pt states he has felt that foot was painful and at times feels cool over the past 6 months.  He states he soaks his foot in warm water and epsom salts and it feels better but then the pain returns.  Foot has been more swollen with some redness as well.  Pt states this has all been present for at least 6 months.  States he came to the ED today because family had been encouraging him to see a doctor.  He also states he had been seening a wound clinic but when he tried to go back to them he was told that he owed them money and so was not able to be seen.  No fever/chills, no wounds on foot.  No injuries.  Palpation makes symptoms worse.  There are no other associated systemic symptoms, there are no other alleviating or modifying factors.   Past Medical History  Diagnosis Date  . Hyperlipidemia   . Ischemic cardiomyopathy     a. EF 40% 2010. b. Echo (2/14):  mild LVH, EF 30-35%, diff HK, Gr 1 DD, MAC, mild MR, mod LAE, PASP 39  . Chronic anticoagulation   . Cataract   . Hypotension   . Paroxysmal atrial fibrillation   . Peripheral vascular disease     a. s/p L BKA 2014.  [name] History of blood clots     in legs--this was in 2011--has been off of Coumadin 84months;takes Xarelto daily  . Peripheral neuropathy   . Joint pain   . H/O hiatal hernia   . Chronic kidney disease     on Lisinopril to protect kidneys d/t being on Metformin per pt  . Diabetes mellitus     takes Metformni daily  and Lantus 50units bid  . Myocardial infarction     total of 8 heart attacks;last one in 2011  . Atrial flutter   . H/O medication noncompliance     Due to insurance issues  . Chronic systolic CHF  (congestive heart failure)   . Coronary artery disease     a. s/p remote CABG 2001 at [facility] b. Cath 03/2010: for med rx.;  c.  [name] myoview (1/14):  Large Inferior apical MI, very small area of peri-infarct ischemia toward the inferior apical segment. EF 19%.  Med Rx was continued.     Past Surgical History  Procedure Laterality Date  . Revasculariztion  2011/2010    x 2   . Coronary artery bypass graft  03/2000    quadruple  . Tonsillectomy    . Hernia repair    . Cardiac catheterization  03/19/10  . Abdominal aortagram  04-30-12  . Coronary angioplasty      3 stents  . Adenoidectomy    . Amputation Left 06/03/2012    Procedure: AMPUTATION BELOW KNEE;  Surgeon: [name], MD;  Location: [facility] OR;  Service: Vascular;  Laterality: Left;   Family History  Problem Relation Age of Onset  . Heart disease Mother     before age 34  . Diabetes Mother   . Heart attack Mother   . Heart disease Father    History  Substance Use Topics  . Smoking  status: Former Smoker    Types: Cigarettes  . Smokeless tobacco: Never Used     Comment: quit smoking 71yrs ago  . Alcohol Use: No     Comment: occasionally    Review of Systems ROS reviewed and all otherwise negative except for mentioned in HPI  Allergies  Statins; Amlodipine besylate; and Cephalexin  Home Medications   Current Outpatient Rx  Name  Route  Sig  Dispense  Refill  . ezetimibe (ZETIA) 10 MG tablet   Oral   Take 1 tablet (10 mg total) by mouth daily.   90 tablet   3   . insulin glargine (LANTUS) 100 UNIT/ML injection   Subcutaneous   Inject 0.5 mLs (50 Units total) into the skin 2 (two) times daily. Please dispense a 90 day supply with refills to complete the year   10 pen   11   . lisinopril (PRINIVIL,ZESTRIL) 5 MG tablet   Oral   Take 1 tablet (5 mg total) by mouth daily.   30 tablet   11   . metoprolol succinate (TOPROL-XL) 50 MG 24 hr tablet   Oral   Take 1 tablet (50 mg total) by mouth 2 (two) times  daily.   60 tablet   11   . nitroGLYCERIN (NITROSTAT) 0.4 MG SL tablet   Sublingual   Place 1 tablet (0.4 mg total) under the tongue every 5 (five) minutes as needed.   25 tablet   11   . oxyCODONE-acetaminophen (PERCOCET/ROXICET) 5-325 MG per tablet   Oral   Take 1-2 tablets by mouth every 6 (six) hours as needed for severe pain.   15 tablet   0    BP 140/82  Pulse 85  Temp(Src) 98.3 F (36.8 C)  Resp 20  SpO2 95% Vitals reviewed Physical Exam Physical Examination: General appearance - alert, well appearing, and in no distress Mental status - alert, oriented to person, place, and time Eyes - no conjunctival injection, no scleral icterus Mouth - mucous membranes moist, pharynx normal without lesions Chest - clear to auscultation, no wheezes, rales or rhonchi, symmetric air entry Heart - normal rate, regular rhythm, normal S1, S2, no murmurs, rubs, clicks or gallops Abdomen - soft, nontender, nondistended, no masses or organomegaly Extremities - left BKA, right lower extremity with mild erythema, brisk cap refill, unable to palpate dp pulse but pulse was found with doppler, no pallor or cyanosis, no pitting edema Skin - normal coloration and turgor, no rashes  ED Course  Procedures (including critical care time) Labs Review Labs Reviewed  CBC - Abnormal; Notable for the following:    MCHC 36.9 (*)    All other components within normal limits  BASIC METABOLIC PANEL - Abnormal; Notable for the following:    Sodium 131 (*)    Glucose, Bld 372 (*)    GFR calc non Af Amer 62 (*)    GFR calc Af Amer 72 (*)    All other components within normal limits   Imaging Review No results found.  EKG Interpretation   None       MDM   1. Hyperglycemia   2. Peripheral vascular disease   Pt has had DVT study which was negative, also ABI performed by vascular tech.  She states that distal perfusion is decreased, but study is largely unchanged from prior study in February  2013.  Foot does not appear cellulitic, there are no open wounds.  Pulse is present with doppler.  I have strongly  encouraged patient to call for vascular surgery follow up.  He has seen vascular and vein in the past.  Discharged with strict return precautions.  Pt agreeable with plan.    Ethelda Chick, MD 03/07/13 [phone]

## 2013-03-08 ENCOUNTER — Encounter: Payer: Self-pay | Admitting: Vascular Surgery

## 2013-03-08 ENCOUNTER — Other Ambulatory Visit: Payer: Self-pay | Admitting: *Deleted

## 2013-03-08 DIAGNOSIS — I739 Peripheral vascular disease, unspecified: Secondary | ICD-10-CM

## 2013-03-09 ENCOUNTER — Encounter: Payer: Self-pay | Admitting: *Deleted

## 2013-03-09 ENCOUNTER — Ambulatory Visit (HOSPITAL_COMMUNITY)
Admission: RE | Admit: 2013-03-09 | Discharge: 2013-03-09 | Disposition: A | Discharge status: Home / Self Care | Payer: Medicare HMO | Source: Ambulatory Visit | Attending: Vascular Surgery | Admitting: Vascular Surgery

## 2013-03-09 ENCOUNTER — Encounter: Payer: Self-pay | Admitting: Vascular Surgery

## 2013-03-09 ENCOUNTER — Ambulatory Visit (INDEPENDENT_AMBULATORY_CARE_PROVIDER_SITE_OTHER): Payer: Commercial Managed Care - HMO | Admitting: Vascular Surgery

## 2013-03-09 VITALS — BP 104/43 | HR 86 | Ht 69.0 in | Wt 302.0 lb

## 2013-03-09 DIAGNOSIS — I998 Other disorder of circulatory system: Secondary | ICD-10-CM | POA: Insufficient documentation

## 2013-03-09 DIAGNOSIS — I739 Peripheral vascular disease, unspecified: Secondary | ICD-10-CM

## 2013-03-09 DIAGNOSIS — M79673 Pain in unspecified foot: Secondary | ICD-10-CM | POA: Insufficient documentation

## 2013-03-09 DIAGNOSIS — I70229 Atherosclerosis of native arteries of extremities with rest pain, unspecified extremity: Secondary | ICD-10-CM | POA: Insufficient documentation

## 2013-03-09 DIAGNOSIS — M79609 Pain in unspecified limb: Secondary | ICD-10-CM

## 2013-03-09 DIAGNOSIS — M79671 Pain in right foot: Secondary | ICD-10-CM

## 2013-03-09 NOTE — Progress Notes (Signed)
Patient presents today for followup of his lower surety arterial insufficiency. He was added on do to a worsening of his right foot pain. 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 He underwent arteriography with Dr. Chen January 2014 showing no reconstruction option in the left. On the right anterior tibial runoff at that time. He presents today with severe rest pain in his right foot. He does have a slight amount of the dry gangrene on the tip of his fourth toe. He does have dependent rubor. No evidence of infection   Past Medical History  Diagnosis Date  . Hyperlipidemia   . Ischemic cardiomyopathy     a. EF 40% 2010. b. Echo (2/14):  mild LVH, EF 30-35%, diff HK, Gr 1 DD, MAC, mild MR, mod LAE, PASP 39  . Chronic anticoagulation   . Cataract   . Hypotension   . Paroxysmal atrial fibrillation   . Peripheral vascular disease     a. s/p L BKA 2014.  . History of blood clots     in legs--this was in 2011--has been off of Coumadin 3months;takes Xarelto daily  . Peripheral neuropathy   . Joint pain   . H/O hiatal hernia   . Chronic kidney disease     on Lisinopril to protect kidneys d/t being on Metformin per pt  . Diabetes mellitus     takes Metformni daily  and Lantus 50units bid  . Myocardial infarction     total of 8 heart attacks;last one in 2011  . Atrial flutter   . H/O medication noncompliance     Due to insurance issues  . Chronic systolic CHF (congestive heart failure)   . Coronary artery disease     a. s/p remote CABG 2001 at [facility] b. Cath 03/2010: for med rx.;  c.  Lexiscan myoview (1/14):  Large Inferior apical MI, very small area of peri-infarct ischemia toward the inferior apical segment. EF 19%.  Med Rx was continued.      History  Substance Use Topics  . Smoking status: Former Smoker    Types: Cigarettes  . Smokeless tobacco: Never Used     Comment: quit smoking 30yrs ago  . Alcohol Use:  No     Comment: occasionally    Family History  Problem Relation Age of Onset  . Heart disease Mother     before age 60  . Diabetes Mother   . Heart attack Mother   . Heart disease Father     Allergies  Allergen Reactions  . Statins     Muscle aches  . Amlodipine Besylate     Muscle aches making difficult to walk  . Cephalexin     REACTION: Hives    Current outpatient prescriptions:ezetimibe (ZETIA) 10 MG tablet, Take 1 tablet (10 mg total) by mouth daily., Disp: 90 tablet, Rfl: 3;  insulin glargine (LANTUS) 100 UNIT/ML injection, Inject 0.5 mLs (50 Units total) into the skin 2 (two) times daily. Please dispense a 90 day supply with refills to complete the year, Disp: 10 pen, Rfl: 11 lisinopril (PRINIVIL,ZESTRIL) 5 MG tablet, Take 1 tablet (5 mg total) by mouth daily., Disp: 30 tablet, Rfl: 11;  metoprolol succinate (TOPROL-XL) 50 MG 24 hr tablet, Take 1 tablet (50 mg total) by mouth 2 (two) times daily., Disp: 60 tablet, Rfl: 11;  nitroGLYCERIN (NITROSTAT) 0.4 MG SL tablet, Place 1 tablet (0.4 mg total) under the tongue every   5 (five) minutes as needed., Disp: 25 tablet, Rfl: 11 oxyCODONE-acetaminophen (PERCOCET/ROXICET) 5-325 MG per tablet, Take 1-2 tablets by mouth every 6 (six) hours as needed for severe pain., Disp: 15 tablet, Rfl: 0  BP 104/43  Pulse 86  Ht 5' 9" (1.753 m)  Wt 302 lb (136.986 kg)  BMI 44.58 kg/m2  SpO2 100%  Body mass index is 44.58 kg/(m^2).       Physical exam the well-developed obese gentleman in no acute distress aside from right foot pain. He has a BK prosthesis in place. His right foot does show dependent rubor. He has sensation intact and does have some tenderness with no fluctuance. There is a small area of the dry gangrene on the tip of his fourth toe.  He does have audible monophasic anterior tibial and posterior tibial Doppler signal.  He underwent a formal duplex today in this does show 2 level disease in his SFA popliteal and tibial  vessels.  Impression and plan worsening lower surety arterial insufficiency now with rest pain. Understands the need for arteriography for further evaluation and possible revascularization for limb salvage. This is scheduled for 1205 as an outpatient at the hospital. He was written prescription for Percocet 5/325 #30 for pain 

## 2013-03-10 ENCOUNTER — Other Ambulatory Visit: Payer: Self-pay | Admitting: *Deleted

## 2013-03-10 DIAGNOSIS — Z01818 Encounter for other preprocedural examination: Secondary | ICD-10-CM

## 2013-03-10 LAB — HM DIABETES EYE EXAM

## 2013-03-11 ENCOUNTER — Encounter (HOSPITAL_COMMUNITY): Payer: Self-pay | Admitting: Pharmacy Technician

## 2013-03-12 ENCOUNTER — Encounter (HOSPITAL_COMMUNITY)
Admission: RE | Disposition: A | Discharge status: Home / Self Care | Payer: Self-pay | Source: Ambulatory Visit | Attending: Vascular Surgery

## 2013-03-12 ENCOUNTER — Ambulatory Visit (HOSPITAL_COMMUNITY)
Admission: RE | Admit: 2013-03-12 | Discharge: 2013-03-12 | Disposition: A | Discharge status: Home / Self Care | Payer: Medicare HMO | Source: Ambulatory Visit | Attending: Vascular Surgery | Admitting: Vascular Surgery

## 2013-03-12 ENCOUNTER — Telehealth: Payer: Self-pay | Admitting: Vascular Surgery

## 2013-03-12 DIAGNOSIS — I70269 Atherosclerosis of native arteries of extremities with gangrene, unspecified extremity: Secondary | ICD-10-CM | POA: Insufficient documentation

## 2013-03-12 DIAGNOSIS — E669 Obesity, unspecified: Secondary | ICD-10-CM | POA: Insufficient documentation

## 2013-03-12 DIAGNOSIS — Z6841 Body Mass Index (BMI) 40.0 and over, adult: Secondary | ICD-10-CM | POA: Insufficient documentation

## 2013-03-12 DIAGNOSIS — E119 Type 2 diabetes mellitus without complications: Secondary | ICD-10-CM | POA: Insufficient documentation

## 2013-03-12 DIAGNOSIS — I2589 Other forms of chronic ischemic heart disease: Secondary | ICD-10-CM | POA: Insufficient documentation

## 2013-03-12 DIAGNOSIS — I2510 Atherosclerotic heart disease of native coronary artery without angina pectoris: Secondary | ICD-10-CM | POA: Insufficient documentation

## 2013-03-12 DIAGNOSIS — G609 Hereditary and idiopathic neuropathy, unspecified: Secondary | ICD-10-CM | POA: Insufficient documentation

## 2013-03-12 DIAGNOSIS — N189 Chronic kidney disease, unspecified: Secondary | ICD-10-CM | POA: Insufficient documentation

## 2013-03-12 DIAGNOSIS — Z7901 Long term (current) use of anticoagulants: Secondary | ICD-10-CM | POA: Insufficient documentation

## 2013-03-12 DIAGNOSIS — Z01818 Encounter for other preprocedural examination: Secondary | ICD-10-CM

## 2013-03-12 DIAGNOSIS — E785 Hyperlipidemia, unspecified: Secondary | ICD-10-CM | POA: Insufficient documentation

## 2013-03-12 DIAGNOSIS — I509 Heart failure, unspecified: Secondary | ICD-10-CM | POA: Insufficient documentation

## 2013-03-12 DIAGNOSIS — Z87891 Personal history of nicotine dependence: Secondary | ICD-10-CM | POA: Insufficient documentation

## 2013-03-12 DIAGNOSIS — Z951 Presence of aortocoronary bypass graft: Secondary | ICD-10-CM | POA: Insufficient documentation

## 2013-03-12 DIAGNOSIS — Z9119 Patient's noncompliance with other medical treatment and regimen: Secondary | ICD-10-CM | POA: Insufficient documentation

## 2013-03-12 DIAGNOSIS — S88119A Complete traumatic amputation at level between knee and ankle, unspecified lower leg, initial encounter: Secondary | ICD-10-CM | POA: Insufficient documentation

## 2013-03-12 DIAGNOSIS — I70229 Atherosclerosis of native arteries of extremities with rest pain, unspecified extremity: Secondary | ICD-10-CM

## 2013-03-12 DIAGNOSIS — I5022 Chronic systolic (congestive) heart failure: Secondary | ICD-10-CM | POA: Insufficient documentation

## 2013-03-12 DIAGNOSIS — I252 Old myocardial infarction: Secondary | ICD-10-CM | POA: Insufficient documentation

## 2013-03-12 DIAGNOSIS — Z91199 Patient's noncompliance with other medical treatment and regimen due to unspecified reason: Secondary | ICD-10-CM | POA: Insufficient documentation

## 2013-03-12 LAB — POCT I-STAT, CHEM 8
BUN: 18 mg/dL (ref 6–23)
Calcium, Ion: 0.95 mmol/L — ABNORMAL LOW (ref 1.13–1.30)
Chloride: 102 mEq/L (ref 96–112)
Creatinine, Ser: 1.5 mg/dL — ABNORMAL HIGH (ref 0.50–1.35)
Glucose, Bld: 365 mg/dL — ABNORMAL HIGH (ref 70–99)
HCT: 52 % (ref 39.0–52.0)
Hemoglobin: 17.7 g/dL — ABNORMAL HIGH (ref 13.0–17.0)
Potassium: 4.1 mEq/L (ref 3.5–5.1)
Sodium: 133 mEq/L — ABNORMAL LOW (ref 135–145)

## 2013-03-12 LAB — GLUCOSE, CAPILLARY
Glucose-Capillary: 339 mg/dL — ABNORMAL HIGH (ref 70–99)
Glucose-Capillary: 297 mg/dL — ABNORMAL HIGH (ref 70–99)

## 2013-03-12 SURGERY — ANGIOGRAM EXTREMITY RIGHT
Laterality: Right

## 2013-03-12 MED ORDER — HEPARIN (PORCINE) IN NACL 2-0.9 UNIT/ML-% IJ SOLN
INTRAMUSCULAR | Status: AC
  Filled 2013-03-12: qty 1000

## 2013-03-12 MED ORDER — ONDANSETRON HCL 4 MG/2ML IJ SOLN: 4.0000 mg | Freq: Four times a day (QID) | INTRAMUSCULAR | Status: DC | PRN

## 2013-03-12 MED ORDER — INSULIN ASPART 100 UNIT/ML ~~LOC~~ SOLN
10.0000 [IU] | Freq: Once | SUBCUTANEOUS | Status: AC
  Administered 2013-03-12: 10 [IU] via SUBCUTANEOUS
  Filled 2013-03-12: qty 0.1
  Filled 2013-03-12: qty 1

## 2013-03-12 MED ORDER — ACETAMINOPHEN 325 MG PO TABS: 650.0000 mg | ORAL_TABLET | ORAL | Status: DC | PRN

## 2013-03-12 MED ORDER — MIDAZOLAM HCL 2 MG/2ML IJ SOLN
INTRAMUSCULAR | Status: AC
  Filled 2013-03-12: qty 2

## 2013-03-12 MED ORDER — SODIUM CHLORIDE 0.9 % IV SOLN
INTRAVENOUS | Status: DC
  Administered 2013-03-12: 08:00:00 via INTRAVENOUS

## 2013-03-12 MED ORDER — INSULIN ASPART 100 UNIT/ML ~~LOC~~ SOLN
10.0000 [IU] | Freq: Once | SUBCUTANEOUS | Status: AC
  Administered 2013-03-12: 10 [IU] via SUBCUTANEOUS
  Filled 2013-03-12: qty 0.1

## 2013-03-12 MED ORDER — OXYCODONE-ACETAMINOPHEN 5-325 MG PO TABS: 1.0000 | ORAL_TABLET | ORAL | Status: DC | PRN

## 2013-03-12 MED ORDER — HYDRALAZINE HCL 20 MG/ML IJ SOLN: 10.0000 mg | Freq: Four times a day (QID) | INTRAMUSCULAR | Status: DC | PRN

## 2013-03-12 MED ORDER — FENTANYL CITRATE 0.05 MG/ML IJ SOLN
INTRAMUSCULAR | Status: AC
  Filled 2013-03-12: qty 2

## 2013-03-12 MED ORDER — SODIUM CHLORIDE 0.9 % IV SOLN: 1.0000 mL/kg/h | INTRAVENOUS | Status: DC

## 2013-03-12 MED ORDER — LIDOCAINE HCL (PF) 1 % IJ SOLN
INTRAMUSCULAR | Status: AC
  Filled 2013-03-12: qty 30

## 2013-03-12 NOTE — Progress Notes (Signed)
Discharge instruction given per MD order. Pt was able to verbalize instructions.  Pt to car via wheelchair. 

## 2013-03-12 NOTE — Telephone Encounter (Signed)
Per handwritten note from Judy:  Leroy [name]  O.V. With Dr Imogene Burn next Friday #[phone] per [facility] ________________________________________________  03/12/13: left message for Mr Turay on home and cell #, dpm

## 2013-03-12 NOTE — Progress Notes (Signed)
Pt received from cath procedure alert and able to tolerate fluids. 

## 2013-03-12 NOTE — Op Note (Signed)
   PATIENT: Leroy [name]   MRN: [id number] DOB: 09/17/1945    DATE OF PROCEDURE: 03/12/2013  INDICATIONS: Leroy [name] is a 67 y.o. male presents with rest pain of his right lower extremity. He has undergone previous left below the knee amputation. He presents for arteriography.  PROCEDURE:  1. Ultrasound-guided access to the right common femoral artery 2. Right femoral arteriogram with right lower extremity runoff  SURGEON: Di Kindle. Edilia Bo, MD, FACS  ANESTHESIA: local with sedation   EBL: minimal  TECHNIQUE: The patient was taken to the peripheral vascular ladder she 1 mg Versed and 50 mcg of fentanyl. Both groins were prepped and draped in usual sterile fashion. Under ultrasound guidance, and after the skin was anesthetized, the left common femoral artery was cannulated using a micropuncture needle. The wire passed but felt somewhat irregular. I attempted to pass the micropuncture sheath but this would not pass easily. It felt like there was significant plaque burden in the common femoral artery. I attempted again with the same result. I therefore elected to cannulate the right groin.  Under ultrasound guidance, after the skin was anesthetized, the right common femoral artery was cannulated with an 18-gauge needle and versacore wire introduced into the infrarenal aorta without difficulty. A 5 French sheath was introduced over the wire. A retrograde right femoral arteriogram was obtained to evaluate the iliac system. Next right lower extremity runoff film was obtained. At the conclusion of the procedure the patient was transferred to the holding area for removal of the sheath.  FINDINGS:  1. The common iliac, external iliac, hypogastric artery on the right are patent. 2. There is irregular plaque within the right common femoral artery with a tight stenosis at the distal right common femoral artery at its bifurcation. His extends into the proximal superficial femoral artery slightly  and also the deep femoral artery. The superficial femoral artery and popliteal arteries are patent with no significant stenosis. The peroneal and posterior tibial arteries are occluded. Intertibial arteries patent to the distal third of the leg were it occludes. There is some plaque in the proximal anterior tibial artery also.  Waverly Ferrari, MD, FACS [facility]  DATE OF DICTATION:   03/12/2013

## 2013-03-12 NOTE — Progress Notes (Signed)
Glucose 365 on ISTAT.  Dr Edilia Bo notified.  Orders received.  Will continue to monitor.

## 2013-03-12 NOTE — Interval H&P Note (Signed)
History and Physical Interval Note:  03/12/2013 7:57 AM  Leroy [name]  has presented today for surgery, with the diagnosis of PVD  The various methods of treatment have been discussed with the patient and family. After consideration of risks, benefits and other options for treatment, the patient has consented to  Procedure(s): ABDOMINAL AORTAGRAM (N/A) as a surgical intervention .  The patient's history has been reviewed, patient examined, no change in status, stable for surgery.  I have reviewed the patient's chart and labs.  Questions were answered to the patient's satisfaction.     [name] S

## 2013-03-12 NOTE — H&P (View-Only) (Signed)
Patient presents today for followup of his lower surety arterial insufficiency. He was added on do to a worsening of his right foot pain. 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 He underwent arteriography with Dr. Imogene Burn January 2014 showing no reconstruction option in the left. On the right anterior tibial runoff at that time. He presents today with severe rest pain in his right foot. He does have a slight amount of the dry gangrene on the tip of his fourth toe. He does have dependent rubor. No evidence of infection   Past Medical History  Diagnosis Date  . Hyperlipidemia   . Ischemic cardiomyopathy     a. EF 40% 2010. b. Echo (2/14):  mild LVH, EF 30-35%, diff HK, Gr 1 DD, MAC, mild MR, mod LAE, PASP 39  . Chronic anticoagulation   . Cataract   . Hypotension   . Paroxysmal atrial fibrillation   . Peripheral vascular disease     a. s/p L BKA 2014.  [name] History of blood clots     in legs--this was in 2011--has been off of Coumadin 45months;takes Xarelto daily  . Peripheral neuropathy   . Joint pain   . H/O hiatal hernia   . Chronic kidney disease     on Lisinopril to protect kidneys d/t being on Metformin per pt  . Diabetes mellitus     takes Metformni daily  and Lantus 50units bid  . Myocardial infarction     total of 8 heart attacks;last one in 2011  . Atrial flutter   . H/O medication noncompliance     Due to insurance issues  . Chronic systolic CHF (congestive heart failure)   . Coronary artery disease     a. s/p remote CABG 2001 at [facility] b. Cath 03/2010: for med rx.;  c.  [name] myoview (1/14):  Large Inferior apical MI, very small area of peri-infarct ischemia toward the inferior apical segment. EF 19%.  Med Rx was continued.      History  Substance Use Topics  . Smoking status: Former Smoker    Types: Cigarettes  . Smokeless tobacco: Never Used     Comment: quit smoking 33yrs ago  . Alcohol Use:  No     Comment: occasionally    Family History  Problem Relation Age of Onset  . Heart disease Mother     before age 40  . Diabetes Mother   . Heart attack Mother   . Heart disease Father     Allergies  Allergen Reactions  . Statins     Muscle aches  . Amlodipine Besylate     Muscle aches making difficult to walk  . Cephalexin     REACTION: Hives    Current outpatient prescriptions:ezetimibe (ZETIA) 10 MG tablet, Take 1 tablet (10 mg total) by mouth daily., Disp: 90 tablet, Rfl: 3;  insulin glargine (LANTUS) 100 UNIT/ML injection, Inject 0.5 mLs (50 Units total) into the skin 2 (two) times daily. Please dispense a 90 day supply with refills to complete the year, Disp: 10 pen, Rfl: 11 lisinopril (PRINIVIL,ZESTRIL) 5 MG tablet, Take 1 tablet (5 mg total) by mouth daily., Disp: 30 tablet, Rfl: 11;  metoprolol succinate (TOPROL-XL) 50 MG 24 hr tablet, Take 1 tablet (50 mg total) by mouth 2 (two) times daily., Disp: 60 tablet, Rfl: 11;  nitroGLYCERIN (NITROSTAT) 0.4 MG SL tablet, Place 1 tablet (0.4 mg total) under the tongue every  5 (five) minutes as needed., Disp: 25 tablet, Rfl: 11 oxyCODONE-acetaminophen (PERCOCET/ROXICET) 5-325 MG per tablet, Take 1-2 tablets by mouth every 6 (six) hours as needed for severe pain., Disp: 15 tablet, Rfl: 0  BP 104/43  Pulse 86  Ht 5\' 9"  (1.753 m)  Wt 302 lb (136.986 kg)  BMI 44.58 kg/m2  SpO2 100%  Body mass index is 44.58 kg/(m^2).       Physical exam the well-developed obese gentleman in no acute distress aside from right foot pain. He has a BK prosthesis in place. His right foot does show dependent rubor. He has sensation intact and does have some tenderness with no fluctuance. There is a small area of the dry gangrene on the tip of his fourth toe.  He does have audible monophasic anterior tibial and posterior tibial Doppler signal.  He underwent a formal duplex today in this does show 2 level disease in his SFA popliteal and tibial  vessels.  Impression and plan worsening lower surety arterial insufficiency now with rest pain. Understands the need for arteriography for further evaluation and possible revascularization for limb salvage. This is scheduled for 1205 as an outpatient at the hospital. He was written prescription for Percocet 5/325 #30 for pain

## 2013-03-18 ENCOUNTER — Encounter: Payer: Self-pay | Admitting: Vascular Surgery

## 2013-03-19 ENCOUNTER — Ambulatory Visit (INDEPENDENT_AMBULATORY_CARE_PROVIDER_SITE_OTHER): Payer: Commercial Managed Care - HMO | Admitting: Vascular Surgery

## 2013-03-19 ENCOUNTER — Encounter: Payer: Self-pay | Admitting: Vascular Surgery

## 2013-03-19 VITALS — BP 144/68 | HR 79 | Ht 69.0 in | Wt 301.0 lb

## 2013-03-19 DIAGNOSIS — I998 Other disorder of circulatory system: Secondary | ICD-10-CM

## 2013-03-19 DIAGNOSIS — I999 Unspecified disorder of circulatory system: Secondary | ICD-10-CM

## 2013-03-19 NOTE — Progress Notes (Signed)
[facility] [place]  Established Critical Limb Ischemia Patient  History of Present Illness  Leroy [name] is a 67 y.o. (10/10/1945) maleh/o L BKA  who presents with chief complaint: rest pain right foot.  The patient has rest pain and wounds include: R 1st/2nd interdigit space in foot.  The patient notes symptoms have progressed from previous.  The patient's treatment regimen currently included: maximal medical management.  He recently had an angiogram completed due to progressive R leg rest pain.  Pt continues to ambulate with a walker and L BKA prosthesis.   Past Medical History  Diagnosis Date  . Hyperlipidemia   . Ischemic cardiomyopathy     a. EF 40% 2010. b. Echo (2/14):  mild LVH, EF 30-35%, diff HK, Gr 1 DD, MAC, mild MR, mod LAE, PASP 39  . Chronic anticoagulation   . Cataract   . Hypotension   . Paroxysmal atrial fibrillation   . Peripheral vascular disease     a. s/p L BKA 2014.  [name] History of blood clots     in legs--this was in 2011--has been off of Coumadin 34months;takes Xarelto daily  . Peripheral neuropathy   . Joint pain   . H/O hiatal hernia   . Chronic kidney disease     on Lisinopril to protect kidneys d/t being on Metformin per pt  . Diabetes mellitus     takes Metformni daily  and Lantus 50units bid  . Myocardial infarction     total of 8 heart attacks;last one in 2011  . Atrial flutter   . H/O medication noncompliance     Due to insurance issues  . Chronic systolic CHF (congestive heart failure)   . Coronary artery disease     a. s/p remote CABG 2001 at [facility]. b. Cath 03/2010: for med rx.;  c.  [name] myoview (1/14):  Large Inferior apical MI, very small area of peri-infarct ischemia toward the inferior apical segment. EF 19%.  Med Rx was continued.      Past Surgical History  Procedure Laterality Date  . Revasculariztion  2011/2010    x 2   . Coronary artery bypass graft  03/2000    quadruple  . Tonsillectomy    .  Hernia repair    . Cardiac catheterization  03/19/10  . Abdominal aortagram  04-30-12  . Coronary angioplasty      3 stents  . Adenoidectomy    . Amputation Left 06/03/2012    Procedure: AMPUTATION BELOW KNEE;  Surgeon: [name], MD;  Location[facility];  Service: Vascular;  Laterality: Left;    History   Social History  . Marital Status: Legally Separated    Spouse Name: N/A    Number of Children: N/A  . Years of Education: N/A   Occupational History  . Not on file.   Social History Main Topics  . Smoking status: Former Smoker    Types: Cigarettes  . Smokeless tobacco: Never Used     Comment: quit smoking 49yrs ago  . Alcohol Use: No     Comment: occasionally  . Drug Use: No  . Sexual Activity: Not Currently   Other Topics Concern  . Not on file   Social History Narrative  . No narrative on file    Family History  Problem Relation Age of Onset  . Heart disease Mother     before age 79  . Diabetes Mother   . Heart attack Mother   .  Heart disease Father     Current Outpatient Prescriptions on File Prior to Visit  Medication Sig Dispense Refill  . ezetimibe (ZETIA) 10 MG tablet Take 1 tablet (10 mg total) by mouth daily.  90 tablet  3  . insulin glargine (LANTUS) 100 UNIT/ML injection Inject 0.5 mLs (50 Units total) into the skin 2 (two) times daily. Please dispense a 90 day supply with refills to complete the year  10 pen  11  . insulin lispro (HUMALOG) 100 UNIT/ML injection Inject 5 Units into the skin 3 (three) times daily as needed for high blood sugar.      . lisinopril (PRINIVIL,ZESTRIL) 5 MG tablet Take 1 tablet (5 mg total) by mouth daily.  30 tablet  11  . metoprolol succinate (TOPROL-XL) 50 MG 24 hr tablet Take 1 tablet (50 mg total) by mouth 2 (two) times daily.  60 tablet  11  . nitroGLYCERIN (NITROSTAT) 0.4 MG SL tablet Place 0.4 mg under the tongue every 5 (five) minutes as needed for chest pain.      [name] oxyCODONE-acetaminophen (PERCOCET/ROXICET) 5-325  MG per tablet Take 1-2 tablets by mouth every 6 (six) hours as needed for severe pain.  15 tablet  0   No current facility-administered medications on file prior to visit.    Allergies  Allergen Reactions  . Statins     Muscle aches  . Amlodipine Besylate     Muscle aches making difficult to walk  . Cephalexin     REACTION: Hives    REVIEW OF SYSTEMS:  (Positives checked otherwise negative)  CARDIOVASCULAR:  []  chest pain, []  chest pressure, []  palpitations, []  shortness of breath when laying flat, []  shortness of breath with exertion,  [x]  pain in feet when walking, [x]  pain in feet when laying flat, []  history of blood clot in veins (DVT), []  history of phlebitis, []  swelling in legs, []  varicose veins  PULMONARY:  []  productive cough, []  asthma, []  wheezing  NEUROLOGIC:  []  weakness in arms or legs, []  numbness in arms or legs, []  difficulty speaking or slurred speech, []  temporary loss of vision in one eye, []  dizziness  HEMATOLOGIC:  []  bleeding problems, []  problems with blood clotting too easily  MUSCULOSKEL:  []  joint pain, []  joint swelling  GASTROINTEST:  []  vomiting blood, []  blood in stool     GENITOURINARY:  []  burning with urination, []  blood in urine  PSYCHIATRIC:  []  history of major depression  INTEGUMENTARY:  []  rashes, [x]  ulcers  For VQI Use Only  PRE-ADM LIVING: Home  AMB STATUS: Ambulatory  CAD Sx: None  PRIOR CHF: None  STRESS TEST: [x]  No, [ ]  Normal, [ ]  + ischemia, [ ]  + MI, [ ]  Both   Physical Examination  Filed Vitals:   03/19/13 0839  BP: 144/68  Pulse: 79  Height: 5\' 9"  (1.753 m)  Weight: 301 lb (136.533 kg)  SpO2: 90%   Body mass index is 44.43 kg/(m^2).  General: A&O x 3, WD, morbidly obese  Eyes: PERRLA, EOMI  Pulmonary: Sym exp, good air movt, CTAB, no rales, rhonchi, & wheezing  Cardiac: RRR, Nl S1, S2, no Murmurs, rubs or gallops  Vascular: Vessel Right Left  Radial Palpable Palpable  Brachial Palpable Palpable   Carotid Palpable, without bruit Palpable, without bruit  Aorta Not palpable N/A  Femoral Palpable Palpable  Popliteal Not palpable Not palpable  PT Not Palpable Not Palpable  DP Not Palpable Not Palpable   Gastrointestinal: soft, NTND, -  G/R, - HSM, - masses, - CVAT B, aorta not palpable due to pannus  Musculoskeletal: M/S 5/5 throughout except L leg s/p BKA, Extremities without ischemic changes except L BKA, R foot with 1/2 interdigital skin slough in progress, no erythema  Neurologic: Pain and light touch intact in extremities , Motor exam as listed above  Medical Decision Making  Leroy [name] is a 67 y.o. male who presents with: RLE critical limb ischemia with wounds.   I have reviewed the recent angiogram and it demonstrates: >90% calcified distal common femoral artery stenosis which is compromising femoral blood flow and near occluded distal arteries.  Only runoff is via AT and it distally is nearly occluded.  There is no bypass option in this patient.  Only option is a R femoral artery endarterectomy and patch angioplasty.  This might improve inflow enough to heal his skin and relieve some of the rest pain.  I have scheduled this for 18 DEC 14.  I discussed in depth with the patient the nature of atherosclerosis, and emphasized the importance of maximal medical management including strict control of blood pressure, blood glucose, and lipid levels, antiplatelet agents, obtaining regular exercise, and cessation of smoking.    The patient is aware that without maximal medical management the underlying atherosclerotic disease process will progress, limiting the benefit of any interventions. The patient is currently not on a statin: due to muscle reaction. The patient is currently on an anti-platelet: due to fall risks.  Thank you for allowing [place] to participate in this patient's care.  [name], MD [facility] [place] Office: [phone] Pager:  [phone]  03/19/2013, 8:58 AM

## 2013-03-22 ENCOUNTER — Encounter (HOSPITAL_COMMUNITY): Payer: Self-pay | Admitting: Pharmacy Technician

## 2013-03-22 ENCOUNTER — Other Ambulatory Visit: Payer: Self-pay | Admitting: *Deleted

## 2013-03-23 ENCOUNTER — Encounter (HOSPITAL_COMMUNITY): Payer: Self-pay

## 2013-03-23 ENCOUNTER — Other Ambulatory Visit: Payer: Self-pay | Admitting: *Deleted

## 2013-03-23 ENCOUNTER — Encounter (HOSPITAL_COMMUNITY)
Admission: RE | Admit: 2013-03-23 | Discharge: 2013-03-23 | Disposition: A | Discharge status: Home / Self Care | Payer: Medicare HMO | Source: Ambulatory Visit | Attending: Vascular Surgery | Admitting: Vascular Surgery

## 2013-03-23 DIAGNOSIS — I739 Peripheral vascular disease, unspecified: Secondary | ICD-10-CM

## 2013-03-23 LAB — URINALYSIS, ROUTINE W REFLEX MICROSCOPIC
Bilirubin Urine: NEGATIVE
Glucose, UA: >1000 mg/dL — AB
Hgb urine dipstick: NEGATIVE
Ketones, ur: NEGATIVE mg/dL
Protein, ur: 100 mg/dL — AB

## 2013-03-23 LAB — COMPREHENSIVE METABOLIC PANEL
ALT: 16 U/L (ref 0–53)
Albumin: 2.9 g/dL — ABNORMAL LOW (ref 3.5–5.2)
Alkaline Phosphatase: 114 U/L (ref 39–117)
CO2: 24 mEq/L (ref 19–32)
Calcium: 9.7 mg/dL (ref 8.4–10.5)
GFR calc Af Amer: 72 mL/min — ABNORMAL LOW (ref >90)
GFR calc non Af Amer: 62 mL/min — ABNORMAL LOW (ref >90)
Glucose, Bld: 338 mg/dL — ABNORMAL HIGH (ref 70–99)
Sodium: 132 mEq/L — ABNORMAL LOW (ref 135–145)
Total Bilirubin: 0.3 mg/dL (ref 0.3–1.2)

## 2013-03-23 LAB — CBC
Hemoglobin: 16.4 g/dL (ref 13.0–17.0)
MCH: 31.6 pg (ref 26.0–34.0)
MCHC: 35.7 g/dL (ref 30.0–36.0)
RBC: 5.19 MIL/uL (ref 4.22–5.81)

## 2013-03-23 LAB — URINE MICROSCOPIC-ADD ON

## 2013-03-23 MED ORDER — OXYCODONE-ACETAMINOPHEN 5-325 MG PO TABS: 1.0000 | ORAL_TABLET | Freq: Four times a day (QID) | ORAL | Status: DC | PRN

## 2013-03-23 NOTE — Pre-Procedure Instructions (Signed)
Leroy [name]  03/23/2013   Your procedure is scheduled on:  Thursday, December 18  Report to [facility] Short Stay Main entrance "A"  at 0530 AM.  Call this number if you have problems the morning of surgery: [phone]   Remember:   Do not eat food or drink liquids after midnight. Wednesday night   Take these medicines the morning of surgery with A SIP OF WATER: Metoprolol   Do not wear jewelry, make-up or nail polish.  Do not wear lotions, powders, or perfumes. You may wear deodorant.  Do not shave 48 hours prior to surgery. Men may shave face and neck.  Do not bring valuables to the hospital.  [facility] is not responsible  for any belongings or valuables.               Contacts, dentures or bridgework may not be worn into surgery.  Leave suitcase in the car. After surgery it may be brought to your room.  For patients admitted to the hospital, discharge time is determined by your                treatment team.               Special Instructions: Shower using CHG 2 nights before surgery and the night before surgery.  If you shower the day of surgery use CHG.  Use special wash - you have one bottle of CHG for all showers.  You should use approximately 1/3 of the bottle for each shower.   Please read over the following fact sheets that you were given: Pain Booklet, Coughing and Deep Breathing and Surgical Site Infection Prevention

## 2013-03-23 NOTE — Progress Notes (Signed)
STOP-Bang score 4; sent to PCP Dr [name]

## 2013-03-23 NOTE — Progress Notes (Signed)
Blood Glucose > 300. Pt called. States he takes the Lantus insulin 50 units twice daily as ordered., but is not taking the Novolog because he is" not eating much". Advised pt to check his CBG TID and take the Novolog as directed. Dr [name] office notified of high glucose.

## 2013-03-24 MED ORDER — VANCOMYCIN HCL 10 G IV SOLR
1500.0000 mg | INTRAVENOUS | Status: AC
  Administered 2013-03-25: 1500 mg via INTRAVENOUS
  Filled 2013-03-24: qty 1500

## 2013-03-24 NOTE — Progress Notes (Signed)
Anesthesia chart review: Patient is a 67 year old male scheduled for right femoral endarterectomy with patch angioplasty on 03/25/13 by Dr. Imogene Burn.    History includes morbid obesity, PAD s/p left BKA 06/03/12, CAD/MI s/p CABG '01, paroxysmal atrial fibrillation/flutter, ischemic cardiomyopathy, chronic systolic CHF, poorly controlled diabetes mellitus type 2 with peripheral neuropathy, CKD, hiatal hernia, former smoker, HTN, HLD, medical non-compliance, "blood clots" in legs 2011. OSA screening score was 4. PCP is Dr. [name].   Cardiologist is Dr. Antoine Poche.  Last cardiology visit was on 01/05/13 with Tereso Newcomer, PA-C, and he had no angina at that time.  EKG then showed NSR, poor R wave progression, lateral T wave inversions, consider ischemia.  It was not felt significantly changed when compared to previous tracing. Earlier this year, he was cleared for this BKA by Dr. Antoine Poche but at high risk given his known cardiomyopathy and poorly treated risk factors.    Echo on 05/12/12 showed:  - Left ventricle: The cavity size was mildly dilated. Wall thickness was increased in a pattern of mild LVH. Systolic function was moderately to severely reduced. The estimated ejection fraction was in the range of 30% to 35%. Diffuse hypokinesis. Doppler parameters are consistent with abnormal left ventricular relaxation (grade 1 diastolic dysfunction). - Mitral valve: Calcified annulus. Mild regurgitation. - Left atrium: The atrium was moderately dilated. - Pulmonary arteries: Systolic pressure was mildly increased. PA peak pressure: 39mm Hg (S). - Trivial tricuspid regurgitation, trivial pulmonic regurgitation   Nuclear stress test on 05/04/12 showed: High risk stress nuclear study. He has had a large Inferior apical MI and has a very small area of peri-infarct ischemia toward the inferior apical segment. His LV function is markedly depressed at 19%. LV Wall Motion: There is severe global hypokinesis. At this point  Dr. Antoine Poche has not recommended repeating his cardiac cath as he felt it would not improve patient's risk. Patient denies any new symptoms since his last cardiology visit and reports that he feels at his baseline from that standpoint.  Cardiac cath from 03/19/10 showed: SVG-OM patent, SVG-RCA occluded and fills via collateral vessels, native RCA with severe diffuse disease and chronic total occlusion, LIMA-LAD patent, medical therapy recommended.   CXR on 08/04/12 showed no evidence of acute cardiopulmonary disease.   Preoperative labs noted. Non-fasting glucose was elevated at 338.  I called patient. He reported a jelly biscuit and OJ a few hours before his appointment and he hadn't taken his Novolog insulin. He reports that typically his fasting glucose readings are < 200 (~ 160-170).  His PAT RN already alerted Dr. Yetta Barre' office of patient's elevated glucose.    Patient's BP was recorded as 88/60 at PAT, but otherwise has not been that low.  I was not asked to evaluate him and his BP was not checked.  As above, patient feels at his baseline from a CV standpoint. BP and fasting CBG will be checked on arrival.  If results are reasonable and no acute CV/CHF symptoms then I would anticipate that he could proceed as planned.  Velna Ochs [facility] Short Stay Center/Anesthesiology Phone [phone] 03/24/2013 10:41 AM

## 2013-03-25 ENCOUNTER — Encounter (HOSPITAL_COMMUNITY)
Admission: RE | Disposition: A | Discharge status: Home / Self Care | Payer: Self-pay | Source: Ambulatory Visit | Attending: Vascular Surgery

## 2013-03-25 ENCOUNTER — Inpatient Hospital Stay (HOSPITAL_COMMUNITY)
Admission: RE | Admit: 2013-03-25 | Discharge: 2013-03-28 | DRG: 238 | Disposition: A | Discharge status: Home / Self Care | Payer: Medicare HMO | Source: Ambulatory Visit | Attending: Vascular Surgery | Admitting: Vascular Surgery

## 2013-03-25 ENCOUNTER — Inpatient Hospital Stay (HOSPITAL_COMMUNITY): Payer: Medicare HMO | Admitting: Anesthesiology

## 2013-03-25 ENCOUNTER — Encounter (HOSPITAL_COMMUNITY): Payer: Self-pay | Admitting: *Deleted

## 2013-03-25 ENCOUNTER — Encounter (HOSPITAL_COMMUNITY): Payer: Medicare HMO | Admitting: Vascular Surgery

## 2013-03-25 DIAGNOSIS — Z86718 Personal history of other venous thrombosis and embolism: Secondary | ICD-10-CM

## 2013-03-25 DIAGNOSIS — Z833 Family history of diabetes mellitus: Secondary | ICD-10-CM

## 2013-03-25 DIAGNOSIS — E785 Hyperlipidemia, unspecified: Secondary | ICD-10-CM | POA: Diagnosis present

## 2013-03-25 DIAGNOSIS — I70229 Atherosclerosis of native arteries of extremities with rest pain, unspecified extremity: Secondary | ICD-10-CM

## 2013-03-25 DIAGNOSIS — E1149 Type 2 diabetes mellitus with other diabetic neurological complication: Secondary | ICD-10-CM | POA: Diagnosis present

## 2013-03-25 DIAGNOSIS — I4891 Unspecified atrial fibrillation: Secondary | ICD-10-CM | POA: Diagnosis present

## 2013-03-25 DIAGNOSIS — N189 Chronic kidney disease, unspecified: Secondary | ICD-10-CM | POA: Diagnosis present

## 2013-03-25 DIAGNOSIS — Z7901 Long term (current) use of anticoagulants: Secondary | ICD-10-CM

## 2013-03-25 DIAGNOSIS — S88119A Complete traumatic amputation at level between knee and ankle, unspecified lower leg, initial encounter: Secondary | ICD-10-CM

## 2013-03-25 DIAGNOSIS — Z9119 Patient's noncompliance with other medical treatment and regimen: Secondary | ICD-10-CM

## 2013-03-25 DIAGNOSIS — I4892 Unspecified atrial flutter: Secondary | ICD-10-CM | POA: Diagnosis present

## 2013-03-25 DIAGNOSIS — I739 Peripheral vascular disease, unspecified: Secondary | ICD-10-CM | POA: Diagnosis present

## 2013-03-25 DIAGNOSIS — I998 Other disorder of circulatory system: Secondary | ICD-10-CM

## 2013-03-25 DIAGNOSIS — I2589 Other forms of chronic ischemic heart disease: Secondary | ICD-10-CM | POA: Diagnosis present

## 2013-03-25 DIAGNOSIS — E1142 Type 2 diabetes mellitus with diabetic polyneuropathy: Secondary | ICD-10-CM | POA: Diagnosis present

## 2013-03-25 DIAGNOSIS — Z87891 Personal history of nicotine dependence: Secondary | ICD-10-CM

## 2013-03-25 DIAGNOSIS — I70209 Unspecified atherosclerosis of native arteries of extremities, unspecified extremity: Secondary | ICD-10-CM | POA: Diagnosis present

## 2013-03-25 DIAGNOSIS — Z8249 Family history of ischemic heart disease and other diseases of the circulatory system: Secondary | ICD-10-CM

## 2013-03-25 DIAGNOSIS — I5022 Chronic systolic (congestive) heart failure: Secondary | ICD-10-CM | POA: Diagnosis present

## 2013-03-25 DIAGNOSIS — L97509 Non-pressure chronic ulcer of other part of unspecified foot with unspecified severity: Secondary | ICD-10-CM | POA: Diagnosis present

## 2013-03-25 DIAGNOSIS — I509 Heart failure, unspecified: Secondary | ICD-10-CM | POA: Diagnosis present

## 2013-03-25 DIAGNOSIS — E1159 Type 2 diabetes mellitus with other circulatory complications: Principal | ICD-10-CM | POA: Diagnosis present

## 2013-03-25 DIAGNOSIS — I2510 Atherosclerotic heart disease of native coronary artery without angina pectoris: Secondary | ICD-10-CM | POA: Diagnosis present

## 2013-03-25 DIAGNOSIS — Z91199 Patient's noncompliance with other medical treatment and regimen due to unspecified reason: Secondary | ICD-10-CM

## 2013-03-25 DIAGNOSIS — Z6841 Body Mass Index (BMI) 40.0 and over, adult: Secondary | ICD-10-CM

## 2013-03-25 DIAGNOSIS — Z951 Presence of aortocoronary bypass graft: Secondary | ICD-10-CM

## 2013-03-25 DIAGNOSIS — I252 Old myocardial infarction: Secondary | ICD-10-CM

## 2013-03-25 DIAGNOSIS — L98499 Non-pressure chronic ulcer of skin of other sites with unspecified severity: Secondary | ICD-10-CM | POA: Diagnosis present

## 2013-03-25 HISTORY — PX: PATCH ANGIOPLASTY: SHX6230

## 2013-03-25 HISTORY — PX: ENDARTERECTOMY FEMORAL: SHX5804

## 2013-03-25 LAB — GLUCOSE, CAPILLARY
Glucose-Capillary: 148 mg/dL — ABNORMAL HIGH (ref 70–99)
Glucose-Capillary: 50 mg/dL — ABNORMAL LOW (ref 70–99)
Glucose-Capillary: 67 mg/dL — ABNORMAL LOW (ref 70–99)

## 2013-03-25 LAB — POCT I-STAT GLUCOSE
Glucose, Bld: 80 mg/dL (ref 70–99)
Operator id: 151301

## 2013-03-25 SURGERY — ENDARTERECTOMY, FEMORAL
Anesthesia: General | Site: Leg Upper | Laterality: Right

## 2013-03-25 MED ORDER — ALUM & MAG HYDROXIDE-SIMETH 200-200-20 MG/5ML PO SUSP: 15.0000 mL | ORAL | Status: DC | PRN

## 2013-03-25 MED ORDER — ONDANSETRON HCL 4 MG/2ML IJ SOLN
INTRAMUSCULAR | Status: DC | PRN
  Administered 2013-03-25: 4 mg via INTRAVENOUS

## 2013-03-25 MED ORDER — THROMBIN 20000 UNITS EX SOLR
CUTANEOUS | Status: AC
  Filled 2013-03-25: qty 20000

## 2013-03-25 MED ORDER — SENNOSIDES-DOCUSATE SODIUM 8.6-50 MG PO TABS
1.0000 | ORAL_TABLET | Freq: Every evening | ORAL | Status: DC | PRN
  Filled 2013-03-25: qty 1

## 2013-03-25 MED ORDER — NEOSTIGMINE METHYLSULFATE 1 MG/ML IJ SOLN
INTRAMUSCULAR | Status: DC | PRN
  Administered 2013-03-25: 4 mg via INTRAVENOUS

## 2013-03-25 MED ORDER — MIDAZOLAM HCL 5 MG/5ML IJ SOLN
INTRAMUSCULAR | Status: DC | PRN
  Administered 2013-03-25: 0.5 mg via INTRAVENOUS
  Administered 2013-03-25: 1.5 mg via INTRAVENOUS

## 2013-03-25 MED ORDER — NITROGLYCERIN 0.4 MG SL SUBL: 0.4000 mg | SUBLINGUAL_TABLET | SUBLINGUAL | Status: DC | PRN

## 2013-03-25 MED ORDER — BISACODYL 10 MG RE SUPP: 10.0000 mg | Freq: Every day | RECTAL | Status: DC | PRN

## 2013-03-25 MED ORDER — HEPARIN (PORCINE) IN NACL 100-0.45 UNIT/ML-% IJ SOLN
500.0000 [IU]/h | INTRAMUSCULAR | Status: DC
  Administered 2013-03-25: 500 [IU]/h via INTRAVENOUS
  Filled 2013-03-25 (×2): qty 250

## 2013-03-25 MED ORDER — DEXTROSE 50 % IV SOLN
1.0000 | Freq: Once | INTRAVENOUS | Status: AC
  Administered 2013-03-25: 50 mL via INTRAVENOUS

## 2013-03-25 MED ORDER — INSULIN GLARGINE 100 UNIT/ML ~~LOC~~ SOLN
50.0000 [IU] | Freq: Two times a day (BID) | SUBCUTANEOUS | Status: DC
  Administered 2013-03-25 – 2013-03-26 (×2): 50 [IU] via SUBCUTANEOUS
  Filled 2013-03-25 (×3): qty 0.5

## 2013-03-25 MED ORDER — PROTAMINE SULFATE 10 MG/ML IV SOLN
INTRAVENOUS | Status: DC | PRN
  Administered 2013-03-25 (×5): 10 mg via INTRAVENOUS

## 2013-03-25 MED ORDER — LACTATED RINGERS IV SOLN
INTRAVENOUS | Status: DC | PRN
  Administered 2013-03-25: 10:00:00 via INTRAVENOUS

## 2013-03-25 MED ORDER — PHENOL 1.4 % MT LIQD: 1.0000 | OROMUCOSAL | Status: DC | PRN

## 2013-03-25 MED ORDER — SODIUM CHLORIDE 0.9 % IV SOLN
INTRAVENOUS | Status: DC | PRN
  Administered 2013-03-25: 07:00:00 via INTRAVENOUS

## 2013-03-25 MED ORDER — SODIUM CHLORIDE 0.9 % IR SOLN
Status: DC | PRN
  Administered 2013-03-25: 10:00:00

## 2013-03-25 MED ORDER — FENTANYL CITRATE 0.05 MG/ML IJ SOLN: 25.0000 ug | INTRAMUSCULAR | Status: DC | PRN

## 2013-03-25 MED ORDER — ACETAMINOPHEN 650 MG RE SUPP: 325.0000 mg | RECTAL | Status: DC | PRN

## 2013-03-25 MED ORDER — CLOPIDOGREL BISULFATE 300 MG PO TABS
300.0000 mg | ORAL_TABLET | Freq: Once | ORAL | Status: AC
  Administered 2013-03-25: 300 mg via ORAL
  Filled 2013-03-25: qty 1

## 2013-03-25 MED ORDER — MAGNESIUM SULFATE 40 MG/ML IJ SOLN
2.0000 g | Freq: Once | INTRAMUSCULAR | Status: AC | PRN
  Filled 2013-03-25: qty 50

## 2013-03-25 MED ORDER — VANCOMYCIN HCL 10 G IV SOLR
1250.0000 mg | Freq: Two times a day (BID) | INTRAVENOUS | Status: AC
  Administered 2013-03-25 – 2013-03-26 (×2): 1250 mg via INTRAVENOUS
  Filled 2013-03-25 (×2): qty 1250

## 2013-03-25 MED ORDER — SUCCINYLCHOLINE CHLORIDE 20 MG/ML IJ SOLN
INTRAMUSCULAR | Status: DC | PRN
  Administered 2013-03-25: 110 mg via INTRAVENOUS

## 2013-03-25 MED ORDER — HYDRALAZINE HCL 20 MG/ML IJ SOLN
INTRAMUSCULAR | Status: AC
  Filled 2013-03-25: qty 1

## 2013-03-25 MED ORDER — DOCUSATE SODIUM 100 MG PO CAPS
100.0000 mg | ORAL_CAPSULE | Freq: Every day | ORAL | Status: DC
  Administered 2013-03-26 – 2013-03-27 (×2): 100 mg via ORAL
  Filled 2013-03-25 (×3): qty 1

## 2013-03-25 MED ORDER — DOPAMINE-DEXTROSE 3.2-5 MG/ML-% IV SOLN: 3.0000 ug/kg/min | INTRAVENOUS | Status: DC

## 2013-03-25 MED ORDER — FENTANYL CITRATE 0.05 MG/ML IJ SOLN
INTRAMUSCULAR | Status: DC | PRN
  Administered 2013-03-25: 50 ug via INTRAVENOUS
  Administered 2013-03-25: 100 ug via INTRAVENOUS
  Administered 2013-03-25: 50 ug via INTRAVENOUS

## 2013-03-25 MED ORDER — ROCURONIUM BROMIDE 100 MG/10ML IV SOLN
INTRAVENOUS | Status: DC | PRN
  Administered 2013-03-25: 40 mg via INTRAVENOUS

## 2013-03-25 MED ORDER — INSULIN ASPART 100 UNIT/ML ~~LOC~~ SOLN
0.0000 [IU] | Freq: Three times a day (TID) | SUBCUTANEOUS | Status: DC
  Administered 2013-03-26: 7 [IU] via SUBCUTANEOUS
  Administered 2013-03-26 – 2013-03-27 (×4): 4 [IU] via SUBCUTANEOUS
  Administered 2013-03-28: 7 [IU] via SUBCUTANEOUS

## 2013-03-25 MED ORDER — SODIUM CHLORIDE 0.9 % IV SOLN: 500.0000 mL | Freq: Once | INTRAVENOUS | Status: AC | PRN

## 2013-03-25 MED ORDER — CLOPIDOGREL BISULFATE 75 MG PO TABS
75.0000 mg | ORAL_TABLET | Freq: Every day | ORAL | Status: DC
  Administered 2013-03-26 – 2013-03-28 (×3): 75 mg via ORAL
  Filled 2013-03-25 (×4): qty 1

## 2013-03-25 MED ORDER — POTASSIUM CHLORIDE CRYS ER 20 MEQ PO TBCR: 20.0000 meq | EXTENDED_RELEASE_TABLET | Freq: Once | ORAL | Status: AC | PRN

## 2013-03-25 MED ORDER — DEXTROSE 50 % IV SOLN
INTRAVENOUS | Status: AC
  Filled 2013-03-25: qty 50

## 2013-03-25 MED ORDER — SODIUM CHLORIDE 0.9 % IV SOLN: INTRAVENOUS | Status: DC

## 2013-03-25 MED ORDER — ENOXAPARIN SODIUM 30 MG/0.3ML ~~LOC~~ SOLN
30.0000 mg | SUBCUTANEOUS | Status: DC
  Filled 2013-03-25: qty 0.3

## 2013-03-25 MED ORDER — 0.9 % SODIUM CHLORIDE (POUR BTL) OPTIME
TOPICAL | Status: DC | PRN
  Administered 2013-03-25: 2000 mL

## 2013-03-25 MED ORDER — VANCOMYCIN HCL IN DEXTROSE 1-5 GM/200ML-% IV SOLN
1000.0000 mg | Freq: Two times a day (BID) | INTRAVENOUS | Status: DC
  Filled 2013-03-25: qty 200

## 2013-03-25 MED ORDER — OXYCODONE-ACETAMINOPHEN 5-325 MG PO TABS
1.0000 | ORAL_TABLET | Freq: Four times a day (QID) | ORAL | Status: DC | PRN
  Administered 2013-03-26 – 2013-03-28 (×5): 2 via ORAL
  Filled 2013-03-25 (×5): qty 2

## 2013-03-25 MED ORDER — ONDANSETRON HCL 4 MG/2ML IJ SOLN: 4.0000 mg | Freq: Four times a day (QID) | INTRAMUSCULAR | Status: DC | PRN

## 2013-03-25 MED ORDER — PROPOFOL 10 MG/ML IV BOLUS
INTRAVENOUS | Status: DC | PRN
  Administered 2013-03-25: 30 mg via INTRAVENOUS
  Administered 2013-03-25: 170 mg via INTRAVENOUS

## 2013-03-25 MED ORDER — LABETALOL HCL 5 MG/ML IV SOLN
10.0000 mg | INTRAVENOUS | Status: DC | PRN
  Filled 2013-03-25: qty 4

## 2013-03-25 MED ORDER — OXYCODONE-ACETAMINOPHEN 5-325 MG PO TABS: 1.0000 | ORAL_TABLET | ORAL | Status: DC | PRN

## 2013-03-25 MED ORDER — GUAIFENESIN-DM 100-10 MG/5ML PO SYRP: 15.0000 mL | ORAL_SOLUTION | ORAL | Status: DC | PRN

## 2013-03-25 MED ORDER — SODIUM CHLORIDE 0.9 % IV SOLN
INTRAVENOUS | Status: DC
  Administered 2013-03-25 – 2013-03-26 (×2): via INTRAVENOUS

## 2013-03-25 MED ORDER — LIDOCAINE HCL (CARDIAC) 20 MG/ML IV SOLN
INTRAVENOUS | Status: DC | PRN
  Administered 2013-03-25: 80 mg via INTRAVENOUS

## 2013-03-25 MED ORDER — GLYCOPYRROLATE 0.2 MG/ML IJ SOLN
INTRAMUSCULAR | Status: DC | PRN
  Administered 2013-03-25: 0.6 mg via INTRAVENOUS

## 2013-03-25 MED ORDER — HYDROMORPHONE HCL PF 1 MG/ML IJ SOLN: 0.5000 mg | INTRAMUSCULAR | Status: DC | PRN

## 2013-03-25 MED ORDER — PANTOPRAZOLE SODIUM 40 MG PO TBEC
40.0000 mg | DELAYED_RELEASE_TABLET | Freq: Every day | ORAL | Status: DC
  Administered 2013-03-25 – 2013-03-27 (×3): 40 mg via ORAL
  Filled 2013-03-25 (×3): qty 1

## 2013-03-25 MED ORDER — LISINOPRIL 5 MG PO TABS
5.0000 mg | ORAL_TABLET | Freq: Every day | ORAL | Status: DC
  Administered 2013-03-25 – 2013-03-27 (×3): 5 mg via ORAL
  Filled 2013-03-25 (×4): qty 1

## 2013-03-25 MED ORDER — THROMBIN 20000 UNITS EX SOLR
CUTANEOUS | Status: DC | PRN
  Administered 2013-03-25: 12:00:00 via TOPICAL

## 2013-03-25 MED ORDER — METOPROLOL SUCCINATE ER 50 MG PO TB24
50.0000 mg | ORAL_TABLET | Freq: Two times a day (BID) | ORAL | Status: DC
  Administered 2013-03-25 – 2013-03-27 (×5): 50 mg via ORAL
  Filled 2013-03-25 (×7): qty 1

## 2013-03-25 MED ORDER — ACETAMINOPHEN 325 MG PO TABS: 325.0000 mg | ORAL_TABLET | ORAL | Status: DC | PRN

## 2013-03-25 MED ORDER — METOPROLOL TARTRATE 1 MG/ML IV SOLN: 2.0000 mg | INTRAVENOUS | Status: DC | PRN

## 2013-03-25 MED ORDER — SODIUM CHLORIDE 0.9 % IV SOLN
INTRAVENOUS | Status: DC | PRN
  Administered 2013-03-25: 09:00:00 via INTRAVENOUS

## 2013-03-25 MED ORDER — HYDRALAZINE HCL 20 MG/ML IJ SOLN
10.0000 mg | INTRAMUSCULAR | Status: DC | PRN
  Administered 2013-03-25: 10 mg via INTRAVENOUS

## 2013-03-25 MED ORDER — HEPARIN SODIUM (PORCINE) 1000 UNIT/ML IJ SOLN
INTRAMUSCULAR | Status: DC | PRN
  Administered 2013-03-25: 10000 [IU] via INTRAVENOUS
  Administered 2013-03-25: 2000 [IU] via INTRAVENOUS

## 2013-03-25 SURGICAL SUPPLY — 52 items
ADH SKN CLS LQ APL DERMABOND (GAUZE/BANDAGES/DRESSINGS) ×2
BANDAGE ELASTIC 4 VELCRO ST LF (GAUZE/BANDAGES/DRESSINGS) IMPLANT
CANISTER SUCTION 2500CC (MISCELLANEOUS) ×3 IMPLANT
CATH EMB 3FR 40CM (CATHETERS) ×1 IMPLANT
CATH EMB 3FR 80CM (CATHETERS) ×1 IMPLANT
CLIP TI MEDIUM 24 (CLIP) ×3 IMPLANT
CLIP TI WIDE RED SMALL 24 (CLIP) ×3 IMPLANT
COVER PROBE W GEL 5X96 (DRAPES) ×1 IMPLANT
COVER SURGICAL LIGHT HANDLE (MISCELLANEOUS) ×3 IMPLANT
DERMABOND ADHESIVE PROPEN (GAUZE/BANDAGES/DRESSINGS) ×1
DERMABOND ADVANCED .7 DNX6 (GAUZE/BANDAGES/DRESSINGS) IMPLANT
DRAIN CHANNEL 15F RND FF W/TCR (WOUND CARE) IMPLANT
DRAPE WARM FLUID 44X44 (DRAPE) ×3 IMPLANT
DRSG COVADERM 4X8 (GAUZE/BANDAGES/DRESSINGS) IMPLANT
ELECT REM PT RETURN 9FT ADLT (ELECTROSURGICAL) ×3
ELECTRODE REM PT RTRN 9FT ADLT (ELECTROSURGICAL) ×2 IMPLANT
EVACUATOR SILICONE 100CC (DRAIN) IMPLANT
GLOVE BIO SURGEON STRL SZ7 (GLOVE) ×4 IMPLANT
GLOVE BIOGEL PI IND STRL 6.5 (GLOVE) IMPLANT
GLOVE BIOGEL PI IND STRL 7.0 (GLOVE) IMPLANT
GLOVE BIOGEL PI IND STRL 7.5 (GLOVE) ×2 IMPLANT
GLOVE BIOGEL PI INDICATOR 6.5 (GLOVE) ×2
GLOVE BIOGEL PI INDICATOR 7.0 (GLOVE) ×2
GLOVE BIOGEL PI INDICATOR 7.5 (GLOVE) ×2
GLOVE ECLIPSE 6.5 STRL STRAW (GLOVE) ×1 IMPLANT
GLOVE SURG SS PI 7.0 STRL IVOR (GLOVE) ×1 IMPLANT
GOWN BRE IMP SLV SIRUS LXLNG (GOWN DISPOSABLE) ×2 IMPLANT
GOWN STRL NON-REIN LRG LVL3 (GOWN DISPOSABLE) ×9 IMPLANT
KIT BASIN OR (CUSTOM PROCEDURE TRAY) ×3 IMPLANT
KIT ROOM TURNOVER OR (KITS) ×3 IMPLANT
NS IRRIG 1000ML POUR BTL (IV SOLUTION) ×6 IMPLANT
PACK PERIPHERAL VASCULAR (CUSTOM PROCEDURE TRAY) ×3 IMPLANT
PAD ARMBOARD 7.5X6 YLW CONV (MISCELLANEOUS) ×7 IMPLANT
PATCH VASCULAR VASCU GUARD 1X6 (Vascular Products) ×1 IMPLANT
SPONGE SURGIFOAM ABS GEL 100 (HEMOSTASIS) IMPLANT
STAPLER VISISTAT 35W (STAPLE) IMPLANT
SUT MNCRL AB 4-0 PS2 18 (SUTURE) ×3 IMPLANT
SUT PROLENE 5 0 C 1 24 (SUTURE) ×2 IMPLANT
SUT PROLENE 6 0 BV (SUTURE) ×2 IMPLANT
SUT PROLENE 7 0 BV1 MDA (SUTURE) ×1 IMPLANT
SUT SILK 2 0 (SUTURE) ×3
SUT SILK 2-0 18XBRD TIE 12 (SUTURE) IMPLANT
SUT VIC AB 2-0 CT1 27 (SUTURE) ×3
SUT VIC AB 2-0 CT1 TAPERPNT 27 (SUTURE) ×2 IMPLANT
SUT VIC AB 3-0 SH 27 (SUTURE) ×3
SUT VIC AB 3-0 SH 27X BRD (SUTURE) ×2 IMPLANT
SYR TB 1ML LUER SLIP (SYRINGE) ×1 IMPLANT
TOWEL OR 17X24 6PK STRL BLUE (TOWEL DISPOSABLE) ×6 IMPLANT
TOWEL OR 17X26 10 PK STRL BLUE (TOWEL DISPOSABLE) ×3 IMPLANT
TRAY FOLEY CATH 16FRSI W/METER (SET/KITS/TRAYS/PACK) ×2 IMPLANT
UNDERPAD 30X30 INCONTINENT (UNDERPADS AND DIAPERS) ×3 IMPLANT
WATER STERILE IRR 1000ML POUR (IV SOLUTION) ×3 IMPLANT

## 2013-03-25 NOTE — Progress Notes (Addendum)
Hypoglycemic Event  CBG: 50  Treatment: D50 IV 50 mL  Symptoms: Sweaty  Follow-up CBG: Time:0907 CBG Result 148  Possible Reasons for Event: Inadequate meal intake  Comments/MD notified:Dr Edwards called and informed    Leroy [name] Ward  Remember to initiate Hypoglycemia Order Set & complete

## 2013-03-25 NOTE — Progress Notes (Signed)
Spoke with Dr. Imogene Burn earlier this afternoon regarding heparin drip per MD and Lovenox both ordered for today. He stated the heparin infusion will be stopped tomorrow morning after he sees the patient. APTT, Heparin level and CBC ordered for AM labs.  I d/c'd Lovenox per instructions from Dr. Imogene Burn. He stated he will start Lovenox tomorrow after the heparin drip has been stopped.  note: pts BMI is >30 and CrCl is >41mL/min- can be on Lovenox 65mg  subQ q24h as prophylactic dosing.   Santi Troung D. Marcena Dias, PharmD, BCPS Clinical Pharmacist 03/25/2013 10:07 PM

## 2013-03-25 NOTE — Anesthesia Preprocedure Evaluation (Addendum)
Anesthesia Evaluation  Patient identified by MRN, date of birth, ID band Patient awake    Reviewed: Allergy & Precautions, H&P , NPO status , Patient's Chart, lab work & pertinent test results, reviewed documented beta blocker date and time   Airway Mallampati: II TM Distance: >3 FB Neck ROM: Full    Dental  (+) Teeth Intact and Dental Advisory Given   Pulmonary former smoker,          Cardiovascular + CAD, + Past MI, + Peripheral Vascular Disease and +CHF + dysrhythmias  Pt states that he has one or two stents in his heart but that the heart attacks came from clots...   Neuro/Psych    GI/Hepatic   Endo/Other  diabetes, Poorly Controlled, Type 2, Insulin DependentPt states that he has been treated for DM for 40 years; took his full dose of Lantus last night.  Woke around 3:30 or 4 a.m. In a sweat and found sugar to be 72 so drank orange juice...  Renal/GU      Musculoskeletal   Abdominal   Peds  Hematology   Anesthesia Other Findings Original assessment done at 0700 today.   Case delayed due to pt's taking 3-4 ounces of orange juice at 4 a.m. (Dr Randa Evens).  Reproductive/Obstetrics                          Anesthesia Physical Anesthesia Plan  ASA: III  Anesthesia Plan: General   Post-op Pain Management:    Induction: Intravenous and Rapid sequence  Airway Management Planned: Oral ETT  Additional Equipment:   Intra-op Plan:   Post-operative Plan: Extubation in OR  Informed Consent:   Dental advisory given  Plan Discussed with: CRNA and Surgeon  Anesthesia Plan Comments:         Anesthesia Quick Evaluation

## 2013-03-25 NOTE — Progress Notes (Signed)
Physician notified: Lelon Mast PA At: [id number]  Regarding: Clarification needed hep gtt and lovenox and plavix ordered? SCD ok on right leg? Awaiting return response.   Returned Response at: 1600  Order(s): SCD RLE. Hold lovenox until tomorrow. Start gtt and plavix today.

## 2013-03-25 NOTE — Anesthesia Procedure Notes (Addendum)
Procedure Name: Intubation Date/Time: 03/25/2013 9:49 AM Performed by: Marni Griffon Pre-anesthesia Checklist: Patient identified, Emergency Drugs available, Suction available and Patient being monitored Patient Re-evaluated:Patient Re-evaluated prior to inductionOxygen Delivery Method: Circle system utilized Preoxygenation: Pre-oxygenation with 100% oxygen Intubation Type: IV induction, Rapid sequence and Cricoid Pressure applied Ventilation: Mask ventilation without difficulty Laryngoscope Size: Mac and 4 Grade View: Grade I Tube type: Oral Tube size: 7.5 mm Number of attempts: 1 Airway Equipment and Method: Stylet Placement Confirmation: positive ETCO2,  ETT inserted through vocal cords under direct vision and breath sounds checked- equal and bilateral Secured at: 21 (cm at gum) cm Tube secured with: Tape Dental Injury: Teeth and Oropharynx as per pre-operative assessment  Comments: Rolled gauze oral biteblock placed

## 2013-03-25 NOTE — Progress Notes (Signed)
Physician notified: Leroy [name] At: [id number]  Regarding: pharmacy requesting clarification of heparin gtt and lovenox ordered. SCD clarification, L BKA R surgical extremity.  Awaiting return response.   No returned response.

## 2013-03-25 NOTE — Preoperative (Signed)
Beta Blockers   Reason not to administer Beta Blockers:Metoprolol at 3 a.m. today.Marland KitchenMarland Kitchen

## 2013-03-25 NOTE — Progress Notes (Signed)
Utilization review completed.  

## 2013-03-25 NOTE — H&P (View-Only) (Signed)
   [facility] [place]  Established Critical Limb Ischemia Patient  History of Present Illness  Leroy [name] is a 67 y.o. (01/18/1946) maleh/o L BKA  who presents with chief complaint: rest pain right foot.  The patient has rest pain and wounds include: R 1st/2nd interdigit space in foot.  The patient notes symptoms have progressed from previous.  The patient's treatment regimen currently included: maximal medical management.  He recently had an angiogram completed due to progressive R leg rest pain.  Pt continues to ambulate with a walker and L BKA prosthesis.   Past Medical History  Diagnosis Date  . Hyperlipidemia   . Ischemic cardiomyopathy     a. EF 40% 2010. b. Echo (2/14):  mild LVH, EF 30-35%, diff HK, Gr 1 DD, MAC, mild MR, mod LAE, PASP 39  . Chronic anticoagulation   . Cataract   . Hypotension   . Paroxysmal atrial fibrillation   . Peripheral vascular disease     a. s/p L BKA 2014.  . History of blood clots     in legs--this was in 2011--has been off of Coumadin 3months;takes Xarelto daily  . Peripheral neuropathy   . Joint pain   . H/O hiatal hernia   . Chronic kidney disease     on Lisinopril to protect kidneys d/t being on Metformin per pt  . Diabetes mellitus     takes Metformni daily  and Lantus 50units bid  . Myocardial infarction     total of 8 heart attacks;last one in 2011  . Atrial flutter   . H/O medication noncompliance     Due to insurance issues  . Chronic systolic CHF (congestive heart failure)   . Coronary artery disease     a. s/p remote CABG 2001 at [facility] b. Cath 03/2010: for med rx.;  c.  Lexiscan myoview (1/14):  Large Inferior apical MI, very small area of peri-infarct ischemia toward the inferior apical segment. EF 19%.  Med Rx was continued.      Past Surgical History  Procedure Laterality Date  . Revasculariztion  2011/2010    x 2   . Coronary artery bypass graft  03/2000    quadruple  . Tonsillectomy    .  Hernia repair    . Cardiac catheterization  03/19/10  . Abdominal aortagram  04-30-12  . Coronary angioplasty      3 stents  . Adenoidectomy    . Amputation Left 06/03/2012    Procedure: AMPUTATION BELOW KNEE;  Surgeon: Brian L Chen, MD;  Location: [facility] OR;  Service: Vascular;  Laterality: Left;    History   Social History  . Marital Status: Legally Separated    Spouse Name: N/A    Number of Children: N/A  . Years of Education: N/A   Occupational History  . Not on file.   Social History Main Topics  . Smoking status: Former Smoker    Types: Cigarettes  . Smokeless tobacco: Never Used     Comment: quit smoking 30yrs ago  . Alcohol Use: No     Comment: occasionally  . Drug Use: No  . Sexual Activity: Not Currently   Other Topics Concern  . Not on file   Social History Narrative  . No narrative on file    Family History  Problem Relation Age of Onset  . Heart disease Mother     before age 60  . Diabetes Mother   . Heart attack Mother   .   Heart disease Father     Current Outpatient Prescriptions on File Prior to Visit  Medication Sig Dispense Refill  . ezetimibe (ZETIA) 10 MG tablet Take 1 tablet (10 mg total) by mouth daily.  90 tablet  3  . insulin glargine (LANTUS) 100 UNIT/ML injection Inject 0.5 mLs (50 Units total) into the skin 2 (two) times daily. Please dispense a 90 day supply with refills to complete the year  10 pen  11  . insulin lispro (HUMALOG) 100 UNIT/ML injection Inject 5 Units into the skin 3 (three) times daily as needed for high blood sugar.      . lisinopril (PRINIVIL,ZESTRIL) 5 MG tablet Take 1 tablet (5 mg total) by mouth daily.  30 tablet  11  . metoprolol succinate (TOPROL-XL) 50 MG 24 hr tablet Take 1 tablet (50 mg total) by mouth 2 (two) times daily.  60 tablet  11  . nitroGLYCERIN (NITROSTAT) 0.4 MG SL tablet Place 0.4 mg under the tongue every 5 (five) minutes as needed for chest pain.      . oxyCODONE-acetaminophen (PERCOCET/ROXICET) 5-325  MG per tablet Take 1-2 tablets by mouth every 6 (six) hours as needed for severe pain.  15 tablet  0   No current facility-administered medications on file prior to visit.    Allergies  Allergen Reactions  . Statins     Muscle aches  . Amlodipine Besylate     Muscle aches making difficult to walk  . Cephalexin     REACTION: Hives    REVIEW OF SYSTEMS:  (Positives checked otherwise negative)  CARDIOVASCULAR:  [] chest pain, [] chest pressure, [] palpitations, [] shortness of breath when laying flat, [] shortness of breath with exertion,  [x] pain in feet when walking, [x] pain in feet when laying flat, [] history of blood clot in veins (DVT), [] history of phlebitis, [] swelling in legs, [] varicose veins  PULMONARY:  [] productive cough, [] asthma, [] wheezing  NEUROLOGIC:  [] weakness in arms or legs, [] numbness in arms or legs, [] difficulty speaking or slurred speech, [] temporary loss of vision in one eye, [] dizziness  HEMATOLOGIC:  [] bleeding problems, [] problems with blood clotting too easily  MUSCULOSKEL:  [] joint pain, [] joint swelling  GASTROINTEST:  [] vomiting blood, [] blood in stool     GENITOURINARY:  [] burning with urination, [] blood in urine  PSYCHIATRIC:  [] history of major depression  INTEGUMENTARY:  [] rashes, [x] ulcers  For VQI Use Only  PRE-ADM LIVING: Home  AMB STATUS: Ambulatory  CAD Sx: None  PRIOR CHF: None  STRESS TEST: [x] No, [ ] Normal, [ ] + ischemia, [ ] + MI, [ ] Both   Physical Examination  Filed Vitals:   03/19/13 0839  BP: 144/68  Pulse: 79  Height: 5' 9" (1.753 m)  Weight: 301 lb (136.533 kg)  SpO2: 90%   Body mass index is 44.43 kg/(m^2).  General: A&O x 3, WD, morbidly obese  Eyes: PERRLA, EOMI  Pulmonary: Sym exp, good air movt, CTAB, no rales, rhonchi, & wheezing  Cardiac: RRR, Nl S1, S2, no Murmurs, rubs or gallops  Vascular: Vessel Right Left  Radial Palpable Palpable  Brachial Palpable Palpable   Carotid Palpable, without bruit Palpable, without bruit  Aorta Not palpable N/A  Femoral Palpable Palpable  Popliteal Not palpable Not palpable  PT Not Palpable Not Palpable  DP Not Palpable Not Palpable   Gastrointestinal: soft, NTND, -  G/R, - HSM, - masses, - CVAT B, aorta not palpable due to pannus  Musculoskeletal: M/S 5/5 throughout except L leg s/p BKA, Extremities without ischemic changes except L BKA, R foot with 1/2 interdigital skin slough in progress, no erythema  Neurologic: Pain and light touch intact in extremities , Motor exam as listed above  Medical Decision Making  Leroy [name] is a 67 y.o. male who presents with: RLE critical limb ischemia with wounds.   I have reviewed the recent angiogram and it demonstrates: >90% calcified distal common femoral artery stenosis which is compromising femoral blood flow and near occluded distal arteries.  Only runoff is via AT and it distally is nearly occluded.  There is no bypass option in this patient.  Only option is a R femoral artery endarterectomy and patch angioplasty.  This might improve inflow enough to heal his skin and relieve some of the rest pain.  I have scheduled this for 18 DEC 14.  I discussed in depth with the patient the nature of atherosclerosis, and emphasized the importance of maximal medical management including strict control of blood pressure, blood glucose, and lipid levels, antiplatelet agents, obtaining regular exercise, and cessation of smoking.    The patient is aware that without maximal medical management the underlying atherosclerotic disease process will progress, limiting the benefit of any interventions. The patient is currently not on a statin: due to muscle reaction. The patient is currently on an anti-platelet: due to fall risks.  Thank you for allowing us to participate in this patient's care.  Brian Chen, MD [facility] [place] Office: [phone] Pager:  [phone]  03/19/2013, 8:58 AM    

## 2013-03-25 NOTE — Op Note (Signed)
OPERATIVE NOTE   PROCEDURE: 1. right iliofemoral endarterectomy with bovine patch angioplasty  PRE-OPERATIVE DIAGNOSIS: right critical limb ischemia  POST-OPERATIVE DIAGNOSIS: same as above   SURGEON: [name], MD  ASSISTANT(S): Lianne Cure, [facility]   ANESTHESIA: spinal and general  ESTIMATED BLOOD LOSS: 200 cc  FINDING(S): 1. Small iliofemoral arteries with some limited calcification and atherosclerotic plaque 2. Diseased right superficial femoral artery: unable to pass 3 Fogarty through superficial femoral artery   SPECIMEN(S):  none  INDICATIONS:   Leroy [name] is a 67 y.o. male who presents with right foot wound and rest pain.  His recent angiogram demonstrated to distal reconstruction target and 75-90% stenosis in his distal common femoral artery.  I offered the patient a femoral endarterectomy with patch angioplasty as an attempt to increase blood flow to his right foot.  The patient is aware that there is no guarantee that this will allow him to salvage his right foot.  The risk, benefits, and alternative for endarterectomy were discussed with the patient.  The patient is aware the risks include but are not limited to: bleeding, infection, myocardial infarction, stroke, limb loss, nerve damage, need for additional procedures in the future, wound complications, and inability to complete the procedure.  The patient is aware of these risks and agreed to proceed.   DESCRIPTION: After obtaining full informed written consent, the patient was brought back to the operating room and placed supine upon the operating table.  The patient received IV antibiotics prior to induction.  After obtaining adequate anesthesia, the patient was prepped and draped in the standard fashion for: right femoral exposure.  Attention was turned to the right groin.  Under Sonosite guidance, I marked the common femoral artery on the skin.  I made an incision over the common femoral artery and dissected  down to the common femoral artery with electrocautery.  I dissected out the common femoral artery from the distal external iliac artery down to the femoral bifurcation.  On initial inspection, the common femoral artery was: small (3-3.5 mm) and diffusely diseased.  Based on the exam, I elected to proceed with: femoral endarterectomy extending into superficial femoral artery .  I dissected out all circumflex branches and the profunda femoral artery and superficial femoral artery.  Control of all branches was obtained with vessel loops.  I found a softer area in the distal external iliac artery amendable to clamping.  The patient was given 10000 units of Heparin intravenously, which was a therapeutic bolus.  In total, 65784 units of Heparin was administrated to achieve and maintain a therapeutic level of anticoagulation.  After waiting 3 minutes, I clamped the distal external iliac artery and placed all circumflex branches, and the profunda and superficial femoral arteries under tension.  I made an arteriotomy in the common femoral artery with a 11-blade and extended it with a Potts scissor proximally and distally.  After suctioning out the artery, the common femoral artery was inspected: There was some segments with anterior plaquing.  Based on the exam, the plan was: focal endarterectomy and bovine patch angioplasty.  Using a Cytogeneticist, I completed an endarterectomy of the segments of anterior plaque.  There was a diffuse densely adherent posterior plaque, which I elected to leave as it was not compromising lumen.  Note, I had to extend the arteriotomy into the superficial femoral artery which was <3 mm.  There was some proximal plaque which was extracted in a segmental fashion.  At this point, I  fashioned a bovine pericardial patch for the geometry of the arteriotomy.  The patch was sewn to the artery with 2 running stitches of 6-0 Prolene, running from each end.  Prior to completing the patch angioplasty,  I backbled the profunda femoral artery: vigorous flow,  superficial femoral artery: slow flow, and distal external iliac artery: vigorous flow.  I obtained a 3 Fogarty and passed it down the superficial femoral artery.  The balloon could not pass any further than 30 cm before encountering a dense obstruction.  The thrombectomy obtained no debris.  The patch angioplasty was completed in the usual fashion.    At this point, I reversed anticoagulation with 50 mg of Protamine.  I placed thrombin and gelfoam in the wound.  After waiting several minutes, active bleeding had mostly stopped.  Bleeding points were controlled with electrocautery.  The incision was repaired with a double layer of 2-0 Vicryl, a double layer of 3-0 Vicryl, and a layer of 4-0 Monocryl in a subcuticular fashion.  The skin was cleaned, dried, and reinforced with Dermabond.  COMPLICATIONS: none  CONDITION: stable  [name], MD [facility] [place] Office: [phone] Pager: [phone]  03/25/2013, 12:51 PM

## 2013-03-25 NOTE — Progress Notes (Addendum)
Pt states that he was shaky this am, he took his cbg at 0400 and it was 72, took about 3 oz of orange juice.  Cbg this am at 0653 was 88. CRNA informed and Dr Randa Evens informed.

## 2013-03-25 NOTE — Transfer of Care (Signed)
Immediate Anesthesia Transfer of Care Note  Patient: Leroy [name]  Procedure(s) Performed: Procedure(s): ENDARTERECTOMY FEMORAL  (Right) PATCH ANGIOPLASTY USING VASCU-GUARD PERIPHERAL VASCULAR PATCH (Right)  Patient Location: PACU  Anesthesia Type:General  Level of Consciousness: awake, alert  and patient cooperative  Airway & Oxygen Therapy: Patient Spontanous Breathing and Patient connected to nasal cannula oxygen  Post-op Assessment: Report given to PACU RN and Post -op Vital signs reviewed and stable  Post vital signs: Reviewed and stable  Complications: No apparent anesthesia complications

## 2013-03-25 NOTE — Interval H&P Note (Signed)
[facility] [place]  History and Physical Update  The patient was interviewed and re-examined.  The patient's previous History and Physical has been reviewed and is unchanged.  There is no change in the plan of care: R iliofemoral endarterectomy with bovine patch angioplasty.  [name], MD [facility] [place] Office: [phone] Pager: [phone]  03/25/2013, 6:57 AM

## 2013-03-26 DIAGNOSIS — Z48812 Encounter for surgical aftercare following surgery on the circulatory system: Secondary | ICD-10-CM

## 2013-03-26 LAB — BASIC METABOLIC PANEL
BUN: 7 mg/dL (ref 6–23)
CO2: 24 mEq/L (ref 19–32)
Calcium: 8.5 mg/dL (ref 8.4–10.5)
Creatinine, Ser: 0.99 mg/dL (ref 0.50–1.35)
GFR calc non Af Amer: 83 mL/min — ABNORMAL LOW (ref >90)

## 2013-03-26 LAB — GLUCOSE, CAPILLARY
Glucose-Capillary: 193 mg/dL — ABNORMAL HIGH (ref 70–99)
Glucose-Capillary: 119 mg/dL — ABNORMAL HIGH (ref 70–99)
Glucose-Capillary: 238 mg/dL — ABNORMAL HIGH (ref 70–99)
Glucose-Capillary: 188 mg/dL — ABNORMAL HIGH (ref 70–99)

## 2013-03-26 LAB — CBC
HCT: 41.1 % (ref 39.0–52.0)
MCH: 31.1 pg (ref 26.0–34.0)
MCHC: 34.1 g/dL (ref 30.0–36.0)
MCV: 91.3 fL (ref 78.0–100.0)
Platelets: 255 10*3/uL (ref 150–400)
RBC: 4.5 MIL/uL (ref 4.22–5.81)
RDW: 12.6 % (ref 11.5–15.5)

## 2013-03-26 LAB — HEMOGLOBIN A1C: Hgb A1c MFr Bld: 12.4 % — ABNORMAL HIGH (ref <5.7)

## 2013-03-26 MED ORDER — ENOXAPARIN SODIUM 80 MG/0.8ML ~~LOC~~ SOLN
65.0000 mg | SUBCUTANEOUS | Status: DC
  Administered 2013-03-26 – 2013-03-27 (×2): 65 mg via SUBCUTANEOUS
  Filled 2013-03-26 (×4): qty 0.8

## 2013-03-26 MED ORDER — INSULIN GLARGINE 100 UNIT/ML ~~LOC~~ SOLN
40.0000 [IU] | Freq: Two times a day (BID) | SUBCUTANEOUS | Status: DC
  Administered 2013-03-26 – 2013-03-27 (×3): 40 [IU] via SUBCUTANEOUS
  Filled 2013-03-26 (×5): qty 0.4

## 2013-03-26 MED ORDER — GLUCAGON HCL (RDNA) 1 MG IJ SOLR: 0.5000 mg | Freq: Once | INTRAMUSCULAR | Status: AC | PRN

## 2013-03-26 MED ORDER — GLUCAGON HCL (RDNA) 1 MG IJ SOLR: 1.0000 mg | Freq: Once | INTRAMUSCULAR | Status: AC | PRN

## 2013-03-26 MED ORDER — DEXTROSE 50 % IV SOLN: 50.0000 mL | Freq: Once | INTRAVENOUS | Status: AC | PRN

## 2013-03-26 MED ORDER — DEXTROSE 50 % IV SOLN: 25.0000 mL | Freq: Once | INTRAVENOUS | Status: AC | PRN

## 2013-03-26 NOTE — Clinical Documentation Improvement (Signed)
THIS DOCUMENT IS NOT A PERMANENT PART OF THE MEDICAL RECORD  Please update your documentation with the medical record to reflect your response to this query. If you need help knowing how to do this please call [phone].  03/26/13   Dear Dr.Chen/Associates,  In a better effort to capture your patient's severity of illness, reflect appropriate length of stay and utilization of resources, a review of the patient medical record has revealed the following indicators.    Based on your clinical judgment, please clarify and document in a progress note and/or discharge summary the clinical condition associated with the following supporting information:  In responding to this query please exercise your independent judgment.  The fact that a query is asked, does not imply that any particular answer is desired or expected.  Please clarify and specify Diabetes type, control, manifestations, and associated conditions.  Possible Clinical Conditions?   ___2____Diabetes Type  1 or 2 _uncontrolled_Controlled or uncontrolled  Manifestations:  _______DM retinopathy  ____x___DM PVD _______DM neuropathy   _______DM nephropathy  Associated conditions: _______DM cellulitis ___x____DM gangrene _______DM gastroparesis _______DM hyperosmolarity state _______DM ketoacidosis with or without coma _______DM osteomyelitis ___x____DM skin ulcer  _______Other Condition _______Cannot Clinically determine     Risk Factors: Patient with a history of diabetes mellitus per 12/12 H&P.  Lab: 03/25/13: Hgb A1c level: 12.4 Mean plasma Blood glucose: 309   You may use possible, probable, or suspect with inpatient documentation. possible, probable, suspected diagnoses MUST be documented at the time of discharge  Reviewed: additional documentation in the medical record  Thank You,  Marciano Sequin, Clinical Documentation Specialist: [phone] Health Information Management [facility]

## 2013-03-26 NOTE — Evaluation (Signed)
Physical Therapy Evaluation Patient Details Name: Leroy [name] MRN: [id number] DOB: January 02, 1946 Today's Date: 03/26/2013 Time: 7829-5621 PT Time Calculation (min): 33 min  PT Assessment / Plan / Recommendation History of Present Illness  Patient is a 67 y/o male admitted for right iliofemoral endarterectomy.  H/o left BKA.  Pt w/ h/o DM with PVD, neuropathy, CAD  Clinical Impression  Patient presents with decreased safety with mobility and will benefit from skilled PT in the acute setting to allow return home with family assist. No follow up PT needs identified.    PT Assessment  Patient needs continued PT services    Follow Up Recommendations  No PT follow up    Does the patient have the potential to tolerate intense rehabilitation    N/A  Barriers to Discharge  None      Equipment Recommendations  None recommended by PT    Recommendations for Other Services   None  Frequency Min 3X/week    Precautions / Restrictions Precautions Precautions: Fall Precaution Comments: reports falling at home 3-4 times in 6 months   Pertinent Vitals/Pain 8/10 right groin      Mobility  Bed Mobility Bed Mobility: Sit to Supine;Scooting to HOB Sit to Supine: 5: Supervision Scooting to [facility]: With rail;5: Supervision Details for Bed Mobility Assistance: Patient determined to show me he can be self sufficient despite significant pain in right groin and refusing pain meds currently Transfers Transfers: Sit to Stand;Stand to Sit Sit to Stand: 5: Supervision;From bed;With upper extremity assist Stand to Sit: To bed;5: Supervision Details for Transfer Assistance: increased time required and effortful with unsafe technique back to bed leaving walker and reaching for furniture rest of way to bed Ambulation/Gait Ambulation/Gait Assistance: 5: Supervision Ambulation Distance (Feet): 120 Feet Assistive device: Rolling walker Ambulation/Gait Assistance Details: kicking walker with left  prosthesis (walker narrow,) and staying flexed with painful right groin.  Demonstrated reaching to floor in hallway without warning trying to make sure I know he can do it Gait Pattern: Step-through pattern;Wide base of support;Trunk flexed;Left circumduction Stairs: No (had ramp at home)    Exercises     PT Diagnosis: Difficulty walking;Acute pain  PT Problem List: Decreased strength;Decreased safety awareness;Decreased mobility;Decreased activity tolerance;Pain PT Treatment Interventions: DME instruction;Patient/family education;Therapeutic activities;Functional mobility training;Gait training     PT Goals(Current goals can be found in the care plan section) Acute Rehab PT Goals Patient Stated Goal: To go home PT Goal Formulation: With patient Time For Goal Achievement: 04/02/13 Potential to Achieve Goals: Good  Visit Information  Last PT Received On: 03/26/13 Assistance Needed: +1 History of Present Illness: Patient is a 67 y/o male admitted for right iliofemoral endarterectomy.  H/o left BKA.  Pt w/ h/o DM with PVD, neuropathy, CAD       Prior Functioning  Home Living Family/patient expects to be discharged to:: Private residence Living Arrangements: Children (daughter and son in Social worker) Available Help at Discharge: Available 24 hours/day;Family Type of Home: House Home Access: Stairs to enter;Ramped entrance Entrance Stairs-Number of Steps: 6 Home Layout: One level Home Equipment: Environmental consultant - 2 wheels;Bedside commode;Shower seat;Grab bars - tub/shower;Wheelchair - manual Additional Comments: tub shower Prior Function Level of Independence: Independent Communication Communication: No difficulties Dominant Hand: Right    Cognition  Cognition Arousal/Alertness: Awake/alert Behavior During Therapy: WFL for tasks assessed/performed Overall Cognitive Status: Within Functional Limits for tasks assessed    Extremity/Trunk Assessment Upper Extremity Assessment Upper Extremity  Assessment: Overall WFL for tasks assessed Lower Extremity Assessment  Lower Extremity Assessment: RLE deficits/detail;LLE deficits/detail RLE Deficits / Details: seated bending to reach to doff prosthesis with difficulty due to pain RLE Sensation: history of peripheral neuropathy LLE Deficits / Details: left BKA Cervical / Trunk Assessment Cervical / Trunk Assessment: Normal   Balance Balance Balance Assessed: Yes Dynamic Sitting Balance Dynamic Sitting - Balance Support: During functional activity;Left upper extremity supported;Feet supported Dynamic Sitting - Level of Assistance: 6: Modified independent (Device/Increase time) Dynamic Sitting - Comments: reaching to doff prosthesis  Dynamic Standing Balance Dynamic Standing - Balance Support: Left upper extremity supported Dynamic Standing - Level of Assistance: 5: Stand by assistance Dynamic Standing - Comments: reaching to floor  End of Session PT - End of Session Activity Tolerance: Patient limited by pain Patient left: in bed;with call bell/phone within reach  GP     [facility] 03/26/2013, 5:16 PM [place], [place] [phone] 03/26/2013

## 2013-03-26 NOTE — Progress Notes (Signed)
OT Cancellation Note  Patient Details Name: Leroy [name] MRN: [id number] DOB: 02/06/1946   Cancelled Treatment:    Reason Eval/Treat Not Completed: Pain limiting ability to participate;Fatigue/lethargy limiting ability to participate.  Pt just finished with PT. Will check back another time.    Boniface Goffe 03/26/2013, 4:05 PM Marica Otter, OTR/L [phone] 03/26/2013

## 2013-03-26 NOTE — Progress Notes (Signed)
CBGs on 12/18: 50/148-97-67/127-100-71-174 mg/dl       16/10:  960 mg/dl Recommend decreasing Lantus to 40 units BID if CBGs continue to be less than 70 mg/dl especially for fasting blood sugars.  HgbA1C was 12.4%.  Will need to be followed by PCP for improvement in blood glucose control. Will continue to follow while in hospital.  Smith Mince RN BSN CDE

## 2013-03-26 NOTE — Anesthesia Postprocedure Evaluation (Signed)
  Anesthesia Post-op Note  Patient: Leroy [name]  Procedure(s) Performed: Procedure(s): ENDARTERECTOMY FEMORAL  (Right) PATCH ANGIOPLASTY USING VASCU-GUARD PERIPHERAL VASCULAR PATCH (Right)  Patient Location: PACU  Anesthesia Type:General  Level of Consciousness: awake  Airway and Oxygen Therapy: Patient Spontanous Breathing  Post-op Pain: mild  Post-op Assessment: Post-op Vital signs reviewed  Post-op Vital Signs: Reviewed  Complications: No apparent anesthesia complications

## 2013-03-26 NOTE — Progress Notes (Signed)
VASCULAR LAB PRELIMINARY  ARTERIAL  ABI completed:    RIGHT    LEFT    PRESSURE WAVEFORM  PRESSURE WAVEFORM  BRACHIAL 110 Triphasic BRACHIAL  Triphasic  AT 50 Severely dampened monophasic possibly a collateral AT BKA   PT  Absent PT BKA     RIGHT LEFT  ABI 0.45 BKA   Right ABI and Doppler waveform indicates a severe reduction in artrial flow at rest  Nardos Putnam, RVS 03/26/2013, 3:08 PM

## 2013-03-26 NOTE — Progress Notes (Signed)
Pt unable to void. Bladder scan volume is 400 ml. MD notified. New orders received to place foley.

## 2013-03-26 NOTE — Progress Notes (Addendum)
   Daily Progress Note  Assessment/Planning: POD #1 s/p L iliofemoral EA w/ bovine patch angioplasty   This patient's intraoperative findings were worsen than suggested by the angiogram.  He may in fact require a fem-pop bypass at some point given the severity of SFA stenosis and calcification found intraop  Observe for wound complications given the obesity in the R groin, the patient is at high risk of complication.  ABI  Transfer to floor  PT/OT evaluation  Home over weekend or early next week  Additional medical issues:  Uncontrolled DM with multiple complications: CKD, neuropathy, PAD, previous L foot gangrene, currently R foot ulcers: on SSI on top baseline outpt med  Non-compliance: routine ignore dietary recommendation leading to uncontrolled DM  CAD: multivessel disease, known significant CHF with reduced EF   Subjective  - 1 Day Post-Op  C/o pain in incision  Objective Filed Vitals:   03/26/13 0300 03/26/13 0356 03/26/13 0755 03/26/13 0800  BP:  142/43 149/51   Pulse: 86 54 86   Temp:  99.6 F (37.6 C)  98.1 F (36.7 C)  TempSrc:  Oral  Oral  Resp: 27 27 18    Height:      Weight:      SpO2: 100% 99% 98%     Intake/Output Summary (Last 24 hours) at 03/26/13 0929 Last data filed at 03/26/13 0836  Gross per 24 hour  Intake 4075.83 ml  Output   2260 ml  Net 1815.83 ml    PULM  CTAB CV  RRR GI  soft, NTND VASC  R groin inc c/d/i, R foot: +DP and PT signals, great toe looks somewhat ischemic and swollen  Laboratory CBC    Component Value Date/Time   WBC 10.8* 03/26/2013 0714   HGB 14.0 03/26/2013 0714   HCT 41.1 03/26/2013 0714   PLT 255 03/26/2013 0714    BMET    Component Value Date/Time   NA 135 03/26/2013 0714   K 4.0 03/26/2013 0714   CL 103 03/26/2013 0714   CO2 24 03/26/2013 0714   GLUCOSE 190* 03/26/2013 0714   BUN 7 03/26/2013 0714   CREATININE 0.99 03/26/2013 0714   CALCIUM 8.5 03/26/2013 0714   GFRNONAA 83* 03/26/2013  0714   GFRAA >90 03/26/2013 1610    [name], MD [facility] [place] Office: [phone] Pager: [phone]  03/26/2013, 9:29 AM

## 2013-03-27 LAB — CBC
HCT: 38.6 % — ABNORMAL LOW (ref 39.0–52.0)
Hemoglobin: 13.1 g/dL (ref 13.0–17.0)
MCH: 30.5 pg (ref 26.0–34.0)
RBC: 4.29 MIL/uL (ref 4.22–5.81)
RDW: 12.3 % (ref 11.5–15.5)

## 2013-03-27 LAB — GLUCOSE, CAPILLARY
Glucose-Capillary: 80 mg/dL (ref 70–99)
Glucose-Capillary: 190 mg/dL — ABNORMAL HIGH (ref 70–99)
Glucose-Capillary: 165 mg/dL — ABNORMAL HIGH (ref 70–99)
Glucose-Capillary: 178 mg/dL — ABNORMAL HIGH (ref 70–99)

## 2013-03-27 LAB — BASIC METABOLIC PANEL
CO2: 24 mEq/L (ref 19–32)
Creatinine, Ser: 1.15 mg/dL (ref 0.50–1.35)
Glucose, Bld: 81 mg/dL (ref 70–99)
Potassium: 3.7 mEq/L (ref 3.5–5.1)
Sodium: 137 mEq/L (ref 135–145)

## 2013-03-27 NOTE — Progress Notes (Addendum)
[facility] [place]  Subjective  - doing well over all.  Sitting up at beside eating.   Objective 129/75 81 98.7 F (37.1 C) (Oral) 20 99%  Intake/Output Summary (Last 24 hours) at 03/27/13 0734 Last data filed at 03/26/13 2247  Gross per 24 hour  Intake      0 ml  Output    600 ml  Net   -600 ml   ABI right 0.45  Doppler signal monophasic DP Groin soft.  Dressing clean and dry.  Assessment/Planning: POD #2 s/p R iliofemoral EA w/ bovine patch angioplasty  This patient's intraoperative findings were worsen than suggested by the angiogram. He may in fact require a fem-pop bypass at some point given the severity of SFA stenosis and calcification found intraop Observe for wound complications given the obesity in the R groin, the patient is at high risk of complication. ABI Transfer to floor PT/OT evaluation Home tomorrow if doing well his daughter lives with him    Leroy [name] [facility] 03/27/2013 7:34 AM --  Laboratory Lab Results:  Recent Labs  03/26/13 0714 03/27/13 0455  WBC 10.8* 10.9*  HGB 14.0 13.1  HCT 41.1 38.6*  PLT 255 231   BMET  Recent Labs  03/26/13 0714 03/27/13 0455  NA 135 137  K 4.0 3.7  CL 103 104  CO2 24 24  GLUCOSE 190* 81  BUN 7 11  CREATININE 0.99 1.15  CALCIUM 8.5 8.3*    COAG Lab Results  Component Value Date   INR 0.97 03/23/2013   INR 2.01* 08/04/2012   INR 0.93 06/02/2012   No results found for this basename: PTT      I have examined the patient, reviewed and agree with above. Right groin healing. Dressing removed. Adequate flow to right foot. Possible discharge in a.m.  Leroy Foots, MD 03/27/2013 10:23 AM

## 2013-03-28 MED ORDER — OXYCODONE-ACETAMINOPHEN 5-325 MG PO TABS: 1.0000 | ORAL_TABLET | Freq: Four times a day (QID) | ORAL | Status: DC | PRN

## 2013-03-28 NOTE — Discharge Summary (Signed)
[facility] Discharge Summary   Patient ID:  Leroy [name] MRN: [id number] DOB/AGE: 04/11/1945 67 y.o.  Admit date: 03/25/2013 Discharge date: 03/28/2013 Date of Surgery: 03/25/2013 Surgeon: Surgeon(s): [name], MD  Admission Diagnosis: femoral stenosis  Discharge Diagnoses:  femoral stenosis  Secondary Diagnoses: Past Medical History  Diagnosis Date  . Hyperlipidemia   . Ischemic cardiomyopathy     a. EF 40% 2010. b. Echo (2/14):  mild LVH, EF 30-35%, diff HK, Gr 1 DD, MAC, mild MR, mod LAE, PASP 39  . Cataract   . Hypotension   . Paroxysmal atrial fibrillation   . Peripheral vascular disease     a. s/p L BKA 2014.  [name] History of blood clots     in legs--this was in 2011--has been off of Coumadin 73months;takes Xarelto daily  . Peripheral neuropathy   . Joint pain   . H/O hiatal hernia   . Chronic kidney disease     on Lisinopril to protect kidneys d/t being on Metformin per pt  . Myocardial infarction     total of 8 heart attacks;last one in 2011  . Atrial flutter   . H/O medication noncompliance     Due to insurance issues  . Chronic systolic CHF (congestive heart failure)   . Coronary artery disease     a. s/p remote CABG 2001 at [facility] b. Cath 03/2010: for med rx.;  c.  [name] myoview (1/14):  Large Inferior apical MI, very small area of peri-infarct ischemia toward the inferior apical segment. EF 19%.  Med Rx was continued.    . Diabetes mellitus 1979    takes Metformni daily  and Lantus 50units bid    Procedure(s): ENDARTERECTOMY FEMORAL  PATCH ANGIOPLASTY USING VASCU-GUARD PERIPHERAL VASCULAR PATCH  Discharged Condition: good  HPI:  Patient presents today for followup of his lower surety arterial insufficiency. He was added on do to a worsening of his right foot pain. 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 He underwent arteriography with Dr. Imogene Burn  January 2014 showing no reconstruction option in the left. On the right anterior tibial runoff at that time. He presents today with severe rest pain in his right foot. He does have a slight amount of the dry gangrene on the tip of his fourth toe. He does have dependent rubor. No evidence of infection     He underwent a formal duplex on 03/09/2013 in this does show 2 level disease in his SFA popliteal and tibial vessels. He under went surgery by Dr. Imogene Burn on 03/25/2013 for right iliofemoral endarterectomy with bovine patch angioplasty.  This patient's intraoperative findings were worsen than suggested by the angiogram.  He may in fact require a fem-pop bypass at some point given the severity of SFA stenosis and calcification found intraoperative.  Observe for wound complications given the obesity in the R groin, the patient is at high risk of complication.  He was instructed to keep the groin clean and dry on a daily basis.  ABI shows right 0.45.  He will be discharged home today.  He has walked in the halls with his rolling walker, taking PO's well and has voided independently. He is ready to go home.      Hospital Course:  Leroy [name] is a 67 y.o. male is S/P Right Procedure(s): ENDARTERECTOMY FEMORAL  PATCH ANGIOPLASTY USING VASCU-GUARD PERIPHERAL VASCULAR PATCH Extubated: POD # 0 Physical  exam: Doppler anterior tibialis, skin is warm to touch.  Right groin incision is lean and dry. Well healed left BKA Post-op wounds healing well Pt. Ambulating, voiding and taking PO diet without difficulty. Pt pain controlled with PO pain meds. Labs as below Complications:none  Consults:     Significant Diagnostic Studies: CBC Lab Results  Component Value Date   WBC 10.9* 03/27/2013   HGB 13.1 03/27/2013   HCT 38.6* 03/27/2013   MCV 90.0 03/27/2013   PLT 231 03/27/2013    BMET    Component Value Date/Time   NA 137 03/27/2013 0455   K 3.7 03/27/2013 0455   CL 104 03/27/2013 0455   CO2  24 03/27/2013 0455   GLUCOSE 81 03/27/2013 0455   BUN 11 03/27/2013 0455   CREATININE 1.15 03/27/2013 0455   CALCIUM 8.3* 03/27/2013 0455   GFRNONAA 65* 03/27/2013 0455   GFRAA 75* 03/27/2013 0455   COAG Lab Results  Component Value Date   INR 0.97 03/23/2013   INR 2.01* 08/04/2012   INR 0.93 06/02/2012     Disposition:  Discharge to :Home Discharge Orders   Future Appointments Provider Department Dept Phone   04/06/2013 11:10 AM Beatrice Lecher, PA-C [facility] [place] Office [phone]   Future Orders Complete By Expires   Call MD for:  redness, tenderness, or signs of infection (pain, swelling, bleeding, redness, odor or green/yellow discharge around incision site)  As directed    Call MD for:  severe or increased pain, loss or decreased feeling  in affected limb(s)  As directed    Call MD for:  temperature >100.5  As directed    Increase activity slowly  As directed    Comments:     Walk with assistance use walker or cane as needed   May shower   As directed    Resume previous diet  As directed        Medication List         insulin glargine 100 UNIT/ML injection  Commonly known as:  LANTUS  Inject 0.5 mLs (50 Units total) into the skin 2 (two) times daily. Please dispense a 90 day supply with refills to complete the year     insulin lispro 100 UNIT/ML injection  Commonly known as:  HUMALOG  Inject 5 Units into the skin 3 (three) times daily as needed for high blood sugar.     lisinopril 5 MG tablet  Commonly known as:  PRINIVIL,ZESTRIL  Take 1 tablet (5 mg total) by mouth daily.     metoprolol succinate 50 MG 24 hr tablet  Commonly known as:  TOPROL-XL  Take 1 tablet (50 mg total) by mouth 2 (two) times daily.     nitroGLYCERIN 0.4 MG SL tablet  Commonly known as:  NITROSTAT  Place 0.4 mg under the tongue every 5 (five) minutes as needed for chest pain.     oxyCODONE-acetaminophen 5-325 MG per tablet  Commonly known as:  PERCOCET/ROXICET  Take 1  tablet by mouth every 6 (six) hours as needed for severe pain.       Verbal and written Discharge instructions given to the patient. Wound care per Discharge AVS     Follow-up Information   Follow up with Nilda Simmer, MD In 2 weeks. (sent)    Specialty:  Vascular Surgery   Contact information:   [address] [place] [address] [phone]       Signed: Clinton Gallant [facility] 03/28/2013, 7:52 AM  Addendum  I have independently interviewed and examined the patient, and I agree with the physician assistant's discharge summary.  This patient with prior L BKA presented with R foot ischemic changes.  His angiogram demonstrated no distal target and significant common femoral artery stenosis.  It was felt the only option to improve blood flow to the right foot was an iliofemoral endarterectomy with bovine patch angioplasty.  The patient underwent this procedure with minimal complications.  Intraoperatively, his arteries were found to be substantially worse than imaged previously.  This patient has extensive calcific disease due to his uncontrolled diabetes with complications.  I suspect this procedure will help with distal flow but might not be enough given the substantially worse SFA disease than previously thought.  The patient will continue with wound care and follow up in the office in two weeks.  I have discussed with the patient the proper care of his groin incision.  He is at increased risk of wound complications due to his obesity.    [name], MD [facility] [place] Office: [phone] Pager: [phone]  03/29/2013, 6:50 AM

## 2013-03-28 NOTE — Progress Notes (Addendum)
[facility] [place]  Subjective  - He states he has walked in the halls with his rolling walker, taking PO's well and has voided independently.  He is ready to go home.   Objective 119/70 75 98.8 F (37.1 C) (Oral) 18 98%  Intake/Output Summary (Last 24 hours) at 03/28/13 0745 Last data filed at 03/28/13 0350  Gross per 24 hour  Intake    840 ml  Output    850 ml  Net    -10 ml    Doppler anterior tibialis, skin is warm to touch. Right groin incision is lean and dry.  Assessment/Planning: POD #3 right iliofemoral endarterectomy with bovine patch angioplasty Walked in the halls with his rolling walker, taking PO's well and has voided independently. D/C home   Clinton Gallant [facility] 03/28/2013 7:45 AM --  Laboratory Lab Results:  Recent Labs  03/26/13 0714 03/27/13 0455  WBC 10.8* 10.9*  HGB 14.0 13.1  HCT 41.1 38.6*  PLT 255 231   BMET  Recent Labs  03/26/13 0714 03/27/13 0455  NA 135 137  K 4.0 3.7  CL 103 104  CO2 24 24  GLUCOSE 190* 81  BUN 7 11  CREATININE 0.99 1.15  CALCIUM 8.5 8.3*    COAG Lab Results  Component Value Date   INR 0.97 03/23/2013   INR 2.01* 08/04/2012   INR 0.93 06/02/2012   No results found for this basename: PTT      I have examined the patient, reviewed and agree with above.  Siddhanth Denk, MD 03/28/2013 8:32 AM

## 2013-03-28 NOTE — Progress Notes (Signed)
Discharged to home with family office visits in place teaching done  

## 2013-03-29 ENCOUNTER — Telehealth: Payer: Self-pay | Admitting: Vascular Surgery

## 2013-03-29 NOTE — Telephone Encounter (Addendum)
Message copied by Fredrich Birks on Mon Mar 29, 2013  2:42 PM ------      Message from: [place], [place] M      Created: Sun Mar 28, 2013  7:50 AM       F/U in 2 weeks with Dr. Imogene Burn.  S/P right femoral angioplasty. ------  03/29/13: spoke with pt, dpm

## 2013-03-30 ENCOUNTER — Encounter (HOSPITAL_COMMUNITY): Payer: Self-pay | Admitting: Vascular Surgery

## 2013-04-06 ENCOUNTER — Ambulatory Visit: Payer: Medicare HMO | Admitting: Physician Assistant

## 2013-04-07 ENCOUNTER — Telehealth: Payer: Self-pay

## 2013-04-07 NOTE — Telephone Encounter (Signed)
Phone call from pt.  Reported swelling of right foot and ankle.  Reports a progression in the condition of the right great toe; describes as "dried up and black".  Reports that there is a wetness inbetween 2nd - 3rd, and 3rd - 4th toes.  Also c/o of the "muscle drawing on the inside of the right lower leg.  Reports that there is pain in the right leg from below the knee to the thigh area.  Denies any swelling above the ankle. States he has been walking intermittently, and also reports elevating right leg above level of heart at intervals.  Questioned about fever/ chills. States "I break out in a sweat sometimes."  Reports he is using pain medication as prescribed.  Discussed pt's. symptoms with Dr. Edilia Bo.  Recommends pt. should soak his foot in lukewarm water with dial soap daily, and place dry 2 x 2" gauze between his toes, to maintain dryness.  Advised pt. Of Dr. Adele Dan instructions.  Next appt. W/ Dr. Imogene Burn is 04/23/13.  Will try to move pt's appt. to an earlier date.  Pt. verb. Understanding of instructions.  Encouraged pt. to call back if symptoms don't improve or worsen.

## 2013-04-12 ENCOUNTER — Ambulatory Visit (HOSPITAL_COMMUNITY): Admission: RE | Admit: 2013-04-12 | Payer: Medicare HMO | Source: Ambulatory Visit

## 2013-04-12 ENCOUNTER — Other Ambulatory Visit: Payer: Self-pay | Admitting: Surgery

## 2013-04-12 ENCOUNTER — Encounter: Payer: Self-pay | Admitting: Family

## 2013-04-12 ENCOUNTER — Ambulatory Visit (INDEPENDENT_AMBULATORY_CARE_PROVIDER_SITE_OTHER)
Admission: RE | Admit: 2013-04-12 | Discharge: 2013-04-12 | Disposition: A | Discharge status: Home / Self Care | Payer: Medicare HMO | Source: Ambulatory Visit | Attending: Family | Admitting: Family

## 2013-04-12 ENCOUNTER — Inpatient Hospital Stay (HOSPITAL_COMMUNITY)
Admission: AD | Admit: 2013-04-12 | Discharge: 2013-04-21 | DRG: 041 | Disposition: A | Discharge status: Rehabilitation Facility | Payer: Medicare HMO | Source: Ambulatory Visit | Attending: Internal Medicine | Admitting: Internal Medicine

## 2013-04-12 ENCOUNTER — Other Ambulatory Visit: Payer: Self-pay | Admitting: *Deleted

## 2013-04-12 ENCOUNTER — Ambulatory Visit (INDEPENDENT_AMBULATORY_CARE_PROVIDER_SITE_OTHER): Payer: Self-pay | Admitting: Family

## 2013-04-12 ENCOUNTER — Encounter (HOSPITAL_COMMUNITY): Payer: Self-pay | Admitting: *Deleted

## 2013-04-12 VITALS — BP 130/60 | HR 88 | Resp 16 | Ht 69.0 in | Wt 302.0 lb

## 2013-04-12 DIAGNOSIS — Z87891 Personal history of nicotine dependence: Secondary | ICD-10-CM

## 2013-04-12 DIAGNOSIS — I25810 Atherosclerosis of coronary artery bypass graft(s) without angina pectoris: Secondary | ICD-10-CM

## 2013-04-12 DIAGNOSIS — I70269 Atherosclerosis of native arteries of extremities with gangrene, unspecified extremity: Secondary | ICD-10-CM | POA: Insufficient documentation

## 2013-04-12 DIAGNOSIS — M79671 Pain in right foot: Secondary | ICD-10-CM

## 2013-04-12 DIAGNOSIS — I96 Gangrene, not elsewhere classified: Secondary | ICD-10-CM | POA: Diagnosis present

## 2013-04-12 DIAGNOSIS — I70229 Atherosclerosis of native arteries of extremities with rest pain, unspecified extremity: Secondary | ICD-10-CM

## 2013-04-12 DIAGNOSIS — N189 Chronic kidney disease, unspecified: Secondary | ICD-10-CM | POA: Diagnosis present

## 2013-04-12 DIAGNOSIS — S88119A Complete traumatic amputation at level between knee and ankle, unspecified lower leg, initial encounter: Secondary | ICD-10-CM

## 2013-04-12 DIAGNOSIS — Z7901 Long term (current) use of anticoagulants: Secondary | ICD-10-CM

## 2013-04-12 DIAGNOSIS — E1142 Type 2 diabetes mellitus with diabetic polyneuropathy: Secondary | ICD-10-CM | POA: Diagnosis present

## 2013-04-12 DIAGNOSIS — Z48812 Encounter for surgical aftercare following surgery on the circulatory system: Secondary | ICD-10-CM

## 2013-04-12 DIAGNOSIS — L97509 Non-pressure chronic ulcer of other part of unspecified foot with unspecified severity: Secondary | ICD-10-CM

## 2013-04-12 DIAGNOSIS — E669 Obesity, unspecified: Secondary | ICD-10-CM | POA: Diagnosis present

## 2013-04-12 DIAGNOSIS — Z8249 Family history of ischemic heart disease and other diseases of the circulatory system: Secondary | ICD-10-CM

## 2013-04-12 DIAGNOSIS — I999 Unspecified disorder of circulatory system: Secondary | ICD-10-CM

## 2013-04-12 DIAGNOSIS — Z91199 Patient's noncompliance with other medical treatment and regimen due to unspecified reason: Secondary | ICD-10-CM

## 2013-04-12 DIAGNOSIS — Z6841 Body Mass Index (BMI) 40.0 and over, adult: Secondary | ICD-10-CM

## 2013-04-12 DIAGNOSIS — I4891 Unspecified atrial fibrillation: Secondary | ICD-10-CM | POA: Diagnosis present

## 2013-04-12 DIAGNOSIS — I998 Other disorder of circulatory system: Secondary | ICD-10-CM

## 2013-04-12 DIAGNOSIS — I509 Heart failure, unspecified: Secondary | ICD-10-CM | POA: Diagnosis present

## 2013-04-12 DIAGNOSIS — I10 Essential (primary) hypertension: Secondary | ICD-10-CM

## 2013-04-12 DIAGNOSIS — Z951 Presence of aortocoronary bypass graft: Secondary | ICD-10-CM

## 2013-04-12 DIAGNOSIS — Z86718 Personal history of other venous thrombosis and embolism: Secondary | ICD-10-CM

## 2013-04-12 DIAGNOSIS — L819 Disorder of pigmentation, unspecified: Secondary | ICD-10-CM

## 2013-04-12 DIAGNOSIS — I252 Old myocardial infarction: Secondary | ICD-10-CM

## 2013-04-12 DIAGNOSIS — E1165 Type 2 diabetes mellitus with hyperglycemia: Secondary | ICD-10-CM | POA: Diagnosis present

## 2013-04-12 DIAGNOSIS — E785 Hyperlipidemia, unspecified: Secondary | ICD-10-CM | POA: Diagnosis present

## 2013-04-12 DIAGNOSIS — M79609 Pain in unspecified limb: Secondary | ICD-10-CM

## 2013-04-12 DIAGNOSIS — I2589 Other forms of chronic ischemic heart disease: Secondary | ICD-10-CM | POA: Diagnosis present

## 2013-04-12 DIAGNOSIS — Z794 Long term (current) use of insulin: Secondary | ICD-10-CM

## 2013-04-12 DIAGNOSIS — I739 Peripheral vascular disease, unspecified: Secondary | ICD-10-CM

## 2013-04-12 DIAGNOSIS — D649 Anemia, unspecified: Secondary | ICD-10-CM | POA: Diagnosis present

## 2013-04-12 DIAGNOSIS — IMO0002 Reserved for concepts with insufficient information to code with codable children: Secondary | ICD-10-CM | POA: Diagnosis present

## 2013-04-12 DIAGNOSIS — I70209 Unspecified atherosclerosis of native arteries of extremities, unspecified extremity: Secondary | ICD-10-CM

## 2013-04-12 DIAGNOSIS — Z9119 Patient's noncompliance with other medical treatment and regimen: Secondary | ICD-10-CM

## 2013-04-12 DIAGNOSIS — Z9861 Coronary angioplasty status: Secondary | ICD-10-CM

## 2013-04-12 DIAGNOSIS — E118 Type 2 diabetes mellitus with unspecified complications: Secondary | ICD-10-CM

## 2013-04-12 DIAGNOSIS — E1149 Type 2 diabetes mellitus with other diabetic neurological complication: Principal | ICD-10-CM | POA: Diagnosis present

## 2013-04-12 DIAGNOSIS — I471 Supraventricular tachycardia: Secondary | ICD-10-CM

## 2013-04-12 DIAGNOSIS — Z833 Family history of diabetes mellitus: Secondary | ICD-10-CM

## 2013-04-12 DIAGNOSIS — E1169 Type 2 diabetes mellitus with other specified complication: Secondary | ICD-10-CM

## 2013-04-12 DIAGNOSIS — I5042 Chronic combined systolic (congestive) and diastolic (congestive) heart failure: Secondary | ICD-10-CM | POA: Diagnosis present

## 2013-04-12 DIAGNOSIS — I2510 Atherosclerotic heart disease of native coronary artery without angina pectoris: Secondary | ICD-10-CM | POA: Diagnosis present

## 2013-04-12 LAB — COMPREHENSIVE METABOLIC PANEL
ALT: 17 U/L (ref 0–53)
AST: 16 U/L (ref 0–37)
Albumin: 2.7 g/dL — ABNORMAL LOW (ref 3.5–5.2)
Alkaline Phosphatase: 119 U/L — ABNORMAL HIGH (ref 39–117)
BUN: 14 mg/dL (ref 6–23)
CO2: 24 mEq/L (ref 19–32)
Calcium: 9.1 mg/dL (ref 8.4–10.5)
Chloride: 97 mEq/L (ref 96–112)
Creatinine, Ser: 1.06 mg/dL (ref 0.50–1.35)
GFR calc Af Amer: 83 mL/min — ABNORMAL LOW (ref >90)
GFR calc non Af Amer: 71 mL/min — ABNORMAL LOW (ref >90)
Glucose, Bld: 207 mg/dL — ABNORMAL HIGH (ref 70–99)
POTASSIUM: 4.3 meq/L (ref 3.7–5.3)
Sodium: 135 mEq/L — ABNORMAL LOW (ref 137–147)
TOTAL PROTEIN: 7.9 g/dL (ref 6.0–8.3)
Total Bilirubin: 0.3 mg/dL (ref 0.3–1.2)

## 2013-04-12 LAB — URINE MICROSCOPIC-ADD ON

## 2013-04-12 LAB — CBC
HEMATOCRIT: 39.4 % (ref 39.0–52.0)
Hemoglobin: 14 g/dL (ref 13.0–17.0)
MCH: 31.2 pg (ref 26.0–34.0)
MCHC: 35.5 g/dL (ref 30.0–36.0)
MCV: 87.8 fL (ref 78.0–100.0)
PLATELETS: 271 10*3/uL (ref 150–400)
RBC: 4.49 MIL/uL (ref 4.22–5.81)
RDW: 12.5 % (ref 11.5–15.5)
WBC: 15.4 10*3/uL — ABNORMAL HIGH (ref 4.0–10.5)

## 2013-04-12 LAB — GLUCOSE, CAPILLARY: Glucose-Capillary: 185 mg/dL — ABNORMAL HIGH (ref 70–99)

## 2013-04-12 LAB — URINALYSIS, ROUTINE W REFLEX MICROSCOPIC
Bilirubin Urine: NEGATIVE
Glucose, UA: 100 mg/dL — AB
Ketones, ur: 15 mg/dL — AB
Leukocytes, UA: NEGATIVE
NITRITE: NEGATIVE
PH: 5.5 (ref 5.0–8.0)
Protein, ur: >300 mg/dL — AB
SPECIFIC GRAVITY, URINE: 1.031 — AB (ref 1.005–1.030)
UROBILINOGEN UA: 1 mg/dL (ref 0.0–1.0)

## 2013-04-12 LAB — PROTIME-INR
INR: 1.17 (ref 0.00–1.49)
PROTHROMBIN TIME: 14.7 s (ref 11.6–15.2)

## 2013-04-12 MED ORDER — PIPERACILLIN-TAZOBACTAM 3.375 G IVPB: 3.3750 g | Freq: Three times a day (TID) | INTRAVENOUS | Status: DC

## 2013-04-12 MED ORDER — INSULIN ASPART 100 UNIT/ML ~~LOC~~ SOLN
0.0000 [IU] | Freq: Every day | SUBCUTANEOUS | Status: DC
  Administered 2013-04-13: 3 [IU] via SUBCUTANEOUS
  Administered 2013-04-18: 2 [IU] via SUBCUTANEOUS

## 2013-04-12 MED ORDER — ONDANSETRON HCL 4 MG/2ML IJ SOLN: 4.0000 mg | Freq: Four times a day (QID) | INTRAMUSCULAR | Status: DC | PRN

## 2013-04-12 MED ORDER — OXYCODONE HCL 5 MG PO TABS
5.0000 mg | ORAL_TABLET | Freq: Four times a day (QID) | ORAL | Status: DC | PRN
  Administered 2013-04-12 – 2013-04-14 (×4): 10 mg via ORAL
  Filled 2013-04-12 (×4): qty 2

## 2013-04-12 MED ORDER — PHENOL 1.4 % MT LIQD
1.0000 | OROMUCOSAL | Status: DC | PRN
  Filled 2013-04-12: qty 177

## 2013-04-12 MED ORDER — HYDRALAZINE HCL 20 MG/ML IJ SOLN: 10.0000 mg | INTRAMUSCULAR | Status: DC | PRN

## 2013-04-12 MED ORDER — SODIUM CHLORIDE 0.9 % IJ SOLN
3.0000 mL | Freq: Two times a day (BID) | INTRAMUSCULAR | Status: DC
  Administered 2013-04-12 – 2013-04-13 (×2): 3 mL via INTRAVENOUS
  Administered 2013-04-14: 23:00:00 via INTRAVENOUS
  Administered 2013-04-15 – 2013-04-19 (×2): 3 mL via INTRAVENOUS

## 2013-04-12 MED ORDER — GUAIFENESIN-DM 100-10 MG/5ML PO SYRP: 15.0000 mL | ORAL_SOLUTION | ORAL | Status: DC | PRN

## 2013-04-12 MED ORDER — DEXTROSE 5 % IV SOLN
2.0000 g | Freq: Three times a day (TID) | INTRAVENOUS | Status: DC
  Administered 2013-04-12 – 2013-04-21 (×26): 2 g via INTRAVENOUS
  Filled 2013-04-12 (×28): qty 2

## 2013-04-12 MED ORDER — VANCOMYCIN HCL 10 G IV SOLR
1250.0000 mg | Freq: Two times a day (BID) | INTRAVENOUS | Status: DC
  Administered 2013-04-13 – 2013-04-18 (×10): 1250 mg via INTRAVENOUS
  Filled 2013-04-12 (×13): qty 1250

## 2013-04-12 MED ORDER — MORPHINE SULFATE 2 MG/ML IJ SOLN
2.0000 mg | INTRAMUSCULAR | Status: DC | PRN
  Administered 2013-04-14 – 2013-04-21 (×2): 4 mg via INTRAVENOUS
  Filled 2013-04-12: qty 1
  Filled 2013-04-12: qty 2
  Filled 2013-04-12: qty 1
  Filled 2013-04-12: qty 2

## 2013-04-12 MED ORDER — INSULIN GLARGINE 100 UNIT/ML ~~LOC~~ SOLN
50.0000 [IU] | Freq: Two times a day (BID) | SUBCUTANEOUS | Status: DC
  Filled 2013-04-12 (×4): qty 0.5

## 2013-04-12 MED ORDER — LABETALOL HCL 5 MG/ML IV SOLN
10.0000 mg | INTRAVENOUS | Status: DC | PRN
  Filled 2013-04-12: qty 4

## 2013-04-12 MED ORDER — VANCOMYCIN HCL 10 G IV SOLR
2500.0000 mg | Freq: Once | INTRAVENOUS | Status: AC
  Administered 2013-04-13: 2500 mg via INTRAVENOUS
  Filled 2013-04-12: qty 2500

## 2013-04-12 MED ORDER — LISINOPRIL 5 MG PO TABS
5.0000 mg | ORAL_TABLET | Freq: Every day | ORAL | Status: DC
  Administered 2013-04-13 – 2013-04-21 (×9): 5 mg via ORAL
  Filled 2013-04-12 (×10): qty 1

## 2013-04-12 MED ORDER — PANTOPRAZOLE SODIUM 40 MG PO TBEC
40.0000 mg | DELAYED_RELEASE_TABLET | Freq: Every day | ORAL | Status: DC
  Administered 2013-04-12 – 2013-04-21 (×10): 40 mg via ORAL
  Filled 2013-04-12 (×11): qty 1

## 2013-04-12 MED ORDER — INSULIN ASPART 100 UNIT/ML ~~LOC~~ SOLN
0.0000 [IU] | Freq: Three times a day (TID) | SUBCUTANEOUS | Status: DC
  Administered 2013-04-13 (×2): 3 [IU] via SUBCUTANEOUS
  Administered 2013-04-13: 2 [IU] via SUBCUTANEOUS
  Administered 2013-04-14: 5 [IU] via SUBCUTANEOUS
  Administered 2013-04-14: 11 [IU] via SUBCUTANEOUS
  Administered 2013-04-15 (×2): 3 [IU] via SUBCUTANEOUS
  Administered 2013-04-16: 5 [IU] via SUBCUTANEOUS
  Administered 2013-04-16: 3 [IU] via SUBCUTANEOUS
  Administered 2013-04-16: 2 [IU] via SUBCUTANEOUS
  Administered 2013-04-17 (×2): 3 [IU] via SUBCUTANEOUS
  Administered 2013-04-18: 8 [IU] via SUBCUTANEOUS
  Administered 2013-04-18: 2 [IU] via SUBCUTANEOUS
  Administered 2013-04-19: 3 [IU] via SUBCUTANEOUS
  Administered 2013-04-20: 2 [IU] via SUBCUTANEOUS
  Administered 2013-04-20 – 2013-04-21 (×3): 3 [IU] via SUBCUTANEOUS

## 2013-04-12 MED ORDER — METOPROLOL TARTRATE 1 MG/ML IV SOLN
2.0000 mg | INTRAVENOUS | Status: DC | PRN
  Administered 2013-04-14: 5 mg via INTRAVENOUS
  Filled 2013-04-12 (×2): qty 5

## 2013-04-12 MED ORDER — METOPROLOL SUCCINATE ER 50 MG PO TB24
50.0000 mg | ORAL_TABLET | Freq: Two times a day (BID) | ORAL | Status: DC
  Administered 2013-04-12 – 2013-04-21 (×18): 50 mg via ORAL
  Filled 2013-04-12 (×19): qty 1

## 2013-04-12 NOTE — Progress Notes (Addendum)
ANTIBIOTIC CONSULT NOTE - INITIAL  Pharmacy Consult for Vancomycin  Indication: gangrene of right great toe, ischemic 2-5 right toes   Allergies  Allergen Reactions  . Statins     Muscle aches  . Amlodipine Besylate     Muscle aches making difficult to walk  . Cephalexin     REACTION: Hives    Patient Measurements: Height: 5\' 9"  (175.3 cm) Weight: 285 lb 4.4 oz (129.4 kg) IBW/kg (Calculated) : 70.7   Vital Signs: Temp: 98.9 F (37.2 C) (01/05 1919) Temp src: Oral (01/05 1919) BP: 132/33 mmHg (01/05 1919) Pulse Rate: 92 (01/05 1919) Intake/Output from previous day:   Intake/Output from this shift:    Labs: No results found for this basename: WBC, HGB, PLT, LABCREA, CREATININE,  in the last 72 hours Estimated Creatinine Clearance: 84.2 ml/min (by C-G formula based on Cr of 1.15). No results found for this basename: VANCOTROUGH, Leodis BinetVANCOPEAK, VANCORANDOM, GENTTROUGH, GENTPEAK, GENTRANDOM, TOBRATROUGH, TOBRAPEAK, TOBRARND, AMIKACINPEAK, AMIKACINTROU, AMIKACIN,  in the last 72 hours   Microbiology: Recent Results (from the past 720 hour(s))  SURGICAL PCR SCREEN     Status: None   Collection Time    03/23/13  2:07 PM      Result Value Range Status   MRSA, PCR NEGATIVE  NEGATIVE Final   Staphylococcus aureus NEGATIVE  NEGATIVE Final   Comment:            The Xpert SA Assay (FDA     approved for NASAL specimens     in patients over 67 years of age),     is one component of     a comprehensive surveillance     program.  Test performance has     been validated by [facility] for patients greater     than or equal to 67 year old.     It is not intended     to diagnose infection nor to     guide or monitor treatment.    Medical History: Past Medical History  Diagnosis Date  . Hyperlipidemia   . Ischemic cardiomyopathy     a. EF 40% 2010. b. Echo (2/14):  mild LVH, EF 30-35%, diff HK, Gr 1 DD, MAC, mild MR, mod LAE, PASP 39  . Cataract   . Hypotension   .  Paroxysmal atrial fibrillation   . Peripheral vascular disease     a. s/p L BKA 2014.  [name]. History of blood clots     in legs--this was in 2011--has been off of Coumadin 173months;takes Xarelto daily  . Peripheral neuropathy   . Joint pain   . H/O hiatal hernia   . Chronic kidney disease     on Lisinopril to protect kidneys d/t being on Metformin per pt  . Myocardial infarction     total of 8 heart attacks;last one in 2011  . Atrial flutter   . H/O medication noncompliance     Due to insurance issues  . Chronic systolic CHF (congestive heart failure)   . Coronary artery disease     a. s/p remote CABG 2001 at [facility] b. Cath 03/2010: for med rx.;  c.  Eugenie BirksLexiscan myoview (1/14):  Large Inferior apical MI, very small area of peri-infarct ischemia toward the inferior apical segment. EF 19%.  Med Rx was continued.    . Diabetes mellitus 1979    takes Metformni daily  and Lantus 50units bid    Assessment: [age] YOM with gangrene of right  great toe, to start vancomycin, possible surgical amputation of part of RLE. Afebrile, CMET pending, est. crcl ~ 65-75 ml/min based on scr = 1.15 on 03/27/2013,   Goal of Therapy:  Vancomycin trough level 15-20 mcg/ml  Plan:  - Vancomycin 2500 mg IV x 1 then 1250 mg IV Q 12 hrs - f/u current renal function and adjust dose if needed. - Vancomycin trough after 3-5 doses  Bayard Hugger, PharmD, BCPS  Clinical Pharmacist  Pager: [phone]   04/12/2013,8:23 PM  Spoke with Dr. Arbie Cookey about patients allergy to cephalexin > hives.  Agreed to change therapy to Aztreonam in addition to Vancomycin.   Thank you for allowing pharmacy to be a part of this patients care team.  Lovenia Kim Pharm.D., BCPS Clinical Pharmacist 04/12/2013 8:46 PM Pager: [phone] Phone: [phone]

## 2013-04-12 NOTE — Progress Notes (Signed)
[facility] OF  HISTORY AND PHYSICAL -PAD  History of Present Illness Leroy [name] is a 68 y.o. male patient that underwent a right iliofemoral endarterectomy with bovine patch angioplasty on 03/25/2013 by Dr. Imogene Burn. He returns today for lower half of right foot worsening pain and worsening gangrene of right great toe. He states pain in right foot with walking but denies pain in right calf or thigh with walking, denies pain in left thigh with walking, denies problem with left BKA stump, uses his prosthesis. He is diabetic with labile glycemic control as he reports, he does not smoke, he does not have CRF. 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   Pt meds include: Statin :No, documented as due to myalgias  Other anticoagulants/antiplatelets: was taking Xaralto samples from his PCP, but when samples ran out he took no more, is not taking any ASA or Plavix, nor coumadin. Dr. Nicky Pugh note from last month indicates that he is not on an anticoagulant of antiplatelet medication likely due to fall risk.  Past Medical History  Diagnosis Date  . Hyperlipidemia   . Ischemic cardiomyopathy     a. EF 40% 2010. b. Echo (2/14):  mild LVH, EF 30-35%, diff HK, Gr 1 DD, MAC, mild MR, mod LAE, PASP 39  . Cataract   . Hypotension   . Paroxysmal atrial fibrillation   . Peripheral vascular disease     a. s/p L BKA 2014.  [name] History of blood clots     in legs--this was in 2011--has been off of Coumadin 27months;takes Xarelto daily  . Peripheral neuropathy   . Joint pain   . H/O hiatal hernia   . Chronic kidney disease     on Lisinopril to protect kidneys d/t being on Metformin per pt  . Myocardial infarction     total of 8 heart attacks;last one in 2011  . Atrial flutter   . H/O medication noncompliance     Due to insurance issues  . Chronic systolic CHF (congestive heart failure)   . Coronary artery disease     a. s/p remote CABG 2001 at [facility] b. Cath 03/2010: for med rx.;  c.   [name] myoview (1/14):  Large Inferior apical MI, very small area of peri-infarct ischemia toward the inferior apical segment. EF 19%.  Med Rx was continued.    . Diabetes mellitus 1979    takes Metformni daily  and Lantus 50units bid    Social History History  Substance Use Topics  . Smoking status: Former Smoker    Types: Cigarettes  . Smokeless tobacco: Never Used     Comment: quit smoking 81yrs ago  . Alcohol Use: No     Comment: occasionally    Family History Family History  Problem Relation Age of Onset  . Heart disease Mother     before age 66  . Diabetes Mother   . Heart attack Mother   . Heart disease Father     Past Surgical History  Procedure Laterality Date  . Revasculariztion  2011/2010    x 2   . Coronary artery bypass graft  03/2000    quadruple  . Tonsillectomy    . Hernia repair    . Cardiac catheterization  03/19/10  . Abdominal aortagram  04-30-12  . Adenoidectomy    . Amputation Left 06/03/2012    Procedure: AMPUTATION BELOW KNEE;  Surgeon: [name], MD;  Location: [facility] OR;  Service: Vascular;  Laterality: Left;  .  Coronary angioplasty  2002    3 stents  . Endarterectomy femoral Right 03/25/2013    Procedure: ENDARTERECTOMY FEMORAL ;  Surgeon: [name], MD;  Location: [facility] OR;  Service: Vascular;  Laterality: Right;  . Patch angioplasty Right 03/25/2013    Procedure: PATCH ANGIOPLASTY USING VASCU-GUARD PERIPHERAL VASCULAR PATCH;  Surgeon: [name], MD;  Location: [facility] OR;  Service: Vascular;  Laterality: Right;    Allergies  Allergen Reactions  . Statins     Muscle aches  . Amlodipine Besylate     Muscle aches making difficult to walk  . Cephalexin     REACTION: Hives    Current Outpatient Prescriptions  Medication Sig Dispense Refill  . insulin glargine (LANTUS) 100 UNIT/ML injection Inject 0.5 mLs (50 Units total) into the skin 2 (two) times daily. Please dispense a 90 day supply with refills to complete the year  10 pen  11  .  insulin lispro (HUMALOG) 100 UNIT/ML injection Inject 5 Units into the skin 3 (three) times daily as needed for high blood sugar.      . lisinopril (PRINIVIL,ZESTRIL) 5 MG tablet Take 1 tablet (5 mg total) by mouth daily.  30 tablet  11  . metoprolol succinate (TOPROL-XL) 50 MG 24 hr tablet Take 1 tablet (50 mg total) by mouth 2 (two) times daily.  60 tablet  11  . nitroGLYCERIN (NITROSTAT) 0.4 MG SL tablet Place 0.4 mg under the tongue every 5 (five) minutes as needed for chest pain.      [name] oxyCODONE-acetaminophen (PERCOCET/ROXICET) 5-325 MG per tablet Take 1 tablet by mouth every 6 (six) hours as needed for severe pain.  30 tablet  0  . KOMBIGLYZE XR 2.08-998 MG TB24       . ZETIA 10 MG tablet        No current facility-administered medications for this visit.    ROS: See HPI for pertinent positives and negatives.   Physical Examination  Filed Vitals:   04/12/13 1609  BP: 130/60  Pulse: 88  Resp: 16   Filed Weights   04/12/13 1609  Weight: 302 lb (136.986 kg)   Body mass index is 44.58 kg/(m^2).   General: A&O x 3, WDWN, morbidly obese. Gait: with left BKA prosthesis  Eyes: no gross abnormalities Pulmonary: CTAB, without wheezes , rales or rhonchi Cardiac: regular Rythm , without murmur          Carotid Bruits Left Right   Negative Negative                           VASCULAR EXAM: Extremities with ischemic changes: gangrene of right great toe, dark/dusky toes 2-5. Left BKA with prosthesis in place.  Maceration between right 3rd and 4th toes.                                                                                                          LE Pulses LEFT RIGHT       FEMORAL   palpable  not  palpable; surgical incision edges are not proximated, white moist material in incision, no purulence, no redness, no swelling        POPLITEAL  not palpable   not palpable       POSTERIOR TIBIAL  BKA   not palpable        DORSALIS PEDIS      ANTERIOR TIBIAL BKA  not  palpable    Abdomen: soft, NT, no masses. Skin: no lesions other than as mentioned under extrenities. Musculoskeletal: no muscle wasting or atrophy.  Neurologic: A&O X 3; Appropriate Affect ; SENSATION: normal; MOTOR FUNCTION:  moving all extremities equally, motor strength 5/5 throughout except for RLE at 4/5. Speech is fluent/normal. CN grossly intact.    Non-Invasive Vascular Imaging: DATE: 04/12/2013  DUPLEX SCAN OF BYPASS: Despite recent intervention, no change in RLE arterial disease.  Velocity and ratio of SFA origin is likely underestimated due to difficult visualization (angioplasty incision infection).  Previous angiogram: Dr. Nicky Pughhen's note from 03/19/13: recent angiogram demonstrates: >90% calcified distal common femoral artery stenosis which is compromising femoral blood flow and near occluded distal arteries. Only runoff is via AT and it distally is nearly occluded. There is no bypass option in this patient. Only option is a R femoral artery endarterectomy and patch angioplasty. This might improve inflow enough to heal his skin and relieve some of the rest pain. I have scheduled this for 18 DEC 14.  ASSESSMENT: Leroy [name] is a 68 y.o. male who is s/p right iliofemoral endarterectomy with bovine patch angioplasty on 03/25/2013 and left BKA Feb., 2014. He presents with gangrenous right great toe and severely ischemic right 2-5 toes, this has progressed over the last 2 weeks, per patient. He has pain in the lower half of his right foot. The right groin surgical incision edges are not proximated, no purulence, no redness, no swelling, but there is a small amount of white necrotic material in the incision, right femoral pulse is not palpable.  His A1C two weeks ago was 12.4%, uncontrolled DM.  His creatinine two weeks ago was WNL at 1.15.  PLAN:  Based on the patient's vascular studies and examination, and after discussing with Dr. Myra GianottiBrabham, patient is to be directly [facility] for [facility] antibiotics , pain control, and possible surgical amputation of part of RLE. Hospital admission orders submitted, I spoke with Dr. Arbie CookeyEarly re the patient's status and admission, and asked him to review the admission orders.   The patient was given information about PAD including signs, symptoms, treatment, what symptoms should prompt the patient to seek immediate medical care, and risk reduction measures to take.  Charisse MarchSuzanne Nickel, RN, MSN, FNP-C [facility] [place] Office Phone: [phone]  Clinic MD: Myra GianottiBrabham  04/12/2013 4:47 PM

## 2013-04-13 DIAGNOSIS — I509 Heart failure, unspecified: Secondary | ICD-10-CM

## 2013-04-13 DIAGNOSIS — I25810 Atherosclerosis of coronary artery bypass graft(s) without angina pectoris: Secondary | ICD-10-CM

## 2013-04-13 DIAGNOSIS — I10 Essential (primary) hypertension: Secondary | ICD-10-CM

## 2013-04-13 DIAGNOSIS — I4891 Unspecified atrial fibrillation: Secondary | ICD-10-CM

## 2013-04-13 DIAGNOSIS — E1149 Type 2 diabetes mellitus with other diabetic neurological complication: Principal | ICD-10-CM

## 2013-04-13 LAB — GLUCOSE, CAPILLARY
GLUCOSE-CAPILLARY: 180 mg/dL — AB (ref 70–99)
Glucose-Capillary: 135 mg/dL — ABNORMAL HIGH (ref 70–99)
Glucose-Capillary: 197 mg/dL — ABNORMAL HIGH (ref 70–99)
Glucose-Capillary: 265 mg/dL — ABNORMAL HIGH (ref 70–99)

## 2013-04-13 MED ORDER — INSULIN ASPART 100 UNIT/ML ~~LOC~~ SOLN
3.0000 [IU] | Freq: Three times a day (TID) | SUBCUTANEOUS | Status: DC
  Administered 2013-04-13 – 2013-04-18 (×7): 3 [IU] via SUBCUTANEOUS

## 2013-04-13 MED ORDER — INSULIN GLARGINE 100 UNIT/ML ~~LOC~~ SOLN
60.0000 [IU] | Freq: Two times a day (BID) | SUBCUTANEOUS | Status: DC
  Administered 2013-04-13 – 2013-04-15 (×3): 60 [IU] via SUBCUTANEOUS
  Filled 2013-04-13 (×6): qty 0.6

## 2013-04-13 MED ORDER — ACETAMINOPHEN 325 MG PO TABS
650.0000 mg | ORAL_TABLET | Freq: Four times a day (QID) | ORAL | Status: DC | PRN
  Administered 2013-04-13 – 2013-04-16 (×3): 650 mg via ORAL
  Filled 2013-04-13 (×4): qty 2

## 2013-04-13 MED ORDER — COLLAGENASE 250 UNIT/GM EX OINT
TOPICAL_OINTMENT | Freq: Every day | CUTANEOUS | Status: DC
  Administered 2013-04-13: 13:00:00 via TOPICAL
  Filled 2013-04-13: qty 30

## 2013-04-13 NOTE — Progress Notes (Signed)
   Daily Progress Note  Assessment/Planning: POD #19 s/p R iliofemoral EA w/ BPA, progression of baseline right foot ischemia   Right foot gangrene: progression in gangrene in evident with now frankly dead great toe and development of ischemic changes in R 4th and 5th toes.  There are also ischemic changes on the plantar surface, so I doubt the right foot is salvagable.  I have offered the patient a R BKA.  He is going to consider such, though previously he absolutely refused to consider any further amputation  Uncontrolled DM with complications including PAD: the patient continues to be uncontrolled, for a multitude of reasons including non-compliance.  I suspect ultimately his uncontrolled diabetes will result in additional complications.  S/p R iliofemoral EA w/ BPA:  Patient has severe R SFA stenosis based on intraoperative findings in addition to the significant common femoral artery and profunda femoral artery findngs.  The poor healing at the right groin is not surprising given this patient's poor DM control and obesity overlying his right groin.  Will manage the wound with Santyl and wound care for now.  If it worsens, surgical debridement and VAC placement likely needed.  CAD: no active sx currently.  Subjective    Pain controlled, no fever or chills  Objective Filed Vitals:   04/12/13 1919 04/13/13 0524  BP: 132/33 104/40  Pulse: 92 91  Temp: 98.9 F (37.2 C) 99.7 F (37.6 C)  TempSrc: Oral Oral  Resp: 18 18  Height: 5\' 9"  (1.753 m)   Weight: 285 lb 4.4 oz (129.4 kg)   SpO2: 98% 97%    Intake/Output Summary (Last 24 hours) at 04/13/13 0856 Last data filed at 04/13/13 8250  Gross per 24 hour  Intake      0 ml  Output    700 ml  Net   -700 ml    PULM  CTAB CV  RRR GI  soft, NTND VASC  R groin: superficial separation, no signs of cellulitis or drainage, desiccated fat; R foot: dead great toe, ulceration of 4th/5th and 3rd/4th digit interspace, no drain, ischemic  plantar surface overlying 1st and 2nd MT  Laboratory CBC    Component Value Date/Time   WBC 15.4* 04/12/2013 2155   HGB 14.0 04/12/2013 2155   HCT 39.4 04/12/2013 2155   PLT 271 04/12/2013 2155    BMET    Component Value Date/Time   NA 135* 04/12/2013 2155   K 4.3 04/12/2013 2155   CL 97 04/12/2013 2155   CO2 24 04/12/2013 2155   GLUCOSE 207* 04/12/2013 2155   BUN 14 04/12/2013 2155   CREATININE 1.06 04/12/2013 2155   CALCIUM 9.1 04/12/2013 2155   GFRNONAA 71* 04/12/2013 2155   GFRAA 83* 04/12/2013 2155   HgA1C (03/25/13): 12.4  [name], MD [facility] [place] Office: [phone] Pager: [phone]  04/13/2013, 8:56 AM

## 2013-04-13 NOTE — Progress Notes (Signed)
IV team had to be called to start pt's PIV. IV team was able to place a small IV in his hand. They recommend a PICC placement if the patient is to receive Vancomycin for very long due to pt having poor veins. Please review and consider.

## 2013-04-13 NOTE — Progress Notes (Signed)
Peripherally Inserted Central Catheter/Midline Placement  The IV Nurse has discussed with the patient and/or persons authorized to consent for the patient, the purpose of this procedure and the potential benefits and risks involved with this procedure.  The benefits include less needle sticks, lab draws from the catheter and patient may be discharged home with the catheter.  Risks include, but not limited to, infection, bleeding, blood clot (thrombus formation), and puncture of an artery; nerve damage and irregular heat beat.  Alternatives to this procedure were also discussed.  PICC/Midline Placement Documentation        Leroy [name] 04/13/2013, 3:38 PM

## 2013-04-13 NOTE — H&P (Signed)
[facility] [place]  See Clinic Progress Note dated 04/12/13 4:47 PM  [name], MD [facility] [place] Office: [phone] Pager: [phone]  04/12/13 1700

## 2013-04-13 NOTE — Consult Note (Addendum)
[facility] Medical Consultation  Leroy [name] EHU:[id number] DOB: February 09, 1946 DOA: 04/12/2013 PCP: [name], MD   Requesting physician: Dr. Imogene Burn Date of consultation: 04/13/13 Reason for consultation: DM  Impression/Recommendations Active Problems:   CAD   Type II or unspecified type diabetes mellitus with neurological manifestations, uncontrolled(250.62)   Critical lower limb ischemia   Gangrene of foot  68 y/o male with PMH of HTN, HPL, CAD s/p CABG, PAF/flutter on Xarelto, CHF/ICM EF 30-35%, CKD, s/p L BKA presented with R foot gangrene, hospitalist was consulted for management of DM  1. R foot gangrene likely due to severe PAD, uncontrolled dMD -per VVS possible amputation; on IV atx; cont management per VVS  2. DM uncontrolled; HA1C-12.4 (03/2013); non adherent to meds  -increase lantus 60BID, novolog 3 units with meals + ISS; titrate per response   3. CAD s/p CABG; denies active cardiopulmonary symptoms'  -cont home regimen   4. PAF on xarelto; patient stopped taking xarelto, could not refill prescription  -obtain ECG, resume xarelto per VVS; cont BB  5. CHF chronic; echo (2014): LVEF 30-35%, grade 1 diastolic dysfunction  -clinically not in CHF; not on diuretics; close monitor start diuresis as needed; cont  ACE   6. HTN cont BB, ACE titrate per response   DVT prophylaxis per primary service   I will followup again tomorrow. Please contact me if I can be of assistance in the meanwhile. Thank you for this consultation.  Chief Complaint: foot pain  HPI:  68 y/o male with PMH of HTN, HPL, CAD s/p CABG, PAF/flutter on Xarelto, ICM EF 30-35%, CKD, s/p L BKA presented with R toe gangrene, hospitalist was consulted for management of DM;  Patient reports not being adherent with DM medications, not always takes his insulin at home as prescribed; denies chest pain, no SOB, no palpitations, no nausea, vomiting or diarrhea    Review of Systems:  Review of Systems   Constitutional: Positive for chills and malaise/fatigue. Negative for fever, weight loss and diaphoresis.  HENT: Negative for congestion, hearing loss and nosebleeds.   Eyes: Negative for blurred vision, double vision and pain.  Respiratory: Negative for cough, hemoptysis and sputum production.   Cardiovascular: Positive for chest pain, palpitations and leg swelling.  Gastrointestinal: Negative for heartburn, nausea, vomiting and diarrhea.  Genitourinary: Negative for dysuria and urgency.  Musculoskeletal: Positive for joint pain and myalgias. Negative for back pain and neck pain.  Neurological: Negative for dizziness, tingling, sensory change and headaches.  Endo/Heme/Allergies: Does not bruise/bleed easily.  Psychiatric/Behavioral: Negative for depression and suicidal ideas.     Past Medical History  Diagnosis Date  . Hyperlipidemia   . Ischemic cardiomyopathy     a. EF 40% 2010. b. Echo (2/14):  mild LVH, EF 30-35%, diff HK, Gr 1 DD, MAC, mild MR, mod LAE, PASP 39  . Cataract   . Hypotension   . Paroxysmal atrial fibrillation   . Peripheral vascular disease     a. s/p L BKA 2014.  [name] History of blood clots     in legs--this was in 2011--has been off of Coumadin 15months;takes Xarelto daily  . Peripheral neuropathy   . Joint pain   . H/O hiatal hernia   . Chronic kidney disease     on Lisinopril to protect kidneys d/t being on Metformin per pt  . Myocardial infarction     total of 8 heart attacks;last one in 2011  . Atrial flutter   . H/O medication noncompliance  Due to insurance issues  . Chronic systolic CHF (congestive heart failure)   . Coronary artery disease     a. s/p remote CABG 2001 at [facility] b. Cath 03/2010: for med rx.;  c.  [name] myoview (1/14):  Large Inferior apical MI, very small area of peri-infarct ischemia toward the inferior apical segment. EF 19%.  Med Rx was continued.    . Diabetes mellitus 1979    takes Metformni daily  and Lantus 50units bid    Past Surgical History  Procedure Laterality Date  . Revasculariztion  2011/2010    x 2   . Coronary artery bypass graft  03/2000    quadruple  . Tonsillectomy    . Hernia repair    . Cardiac catheterization  03/19/10  . Abdominal aortagram  04-30-12  . Adenoidectomy    . Amputation Left 06/03/2012    Procedure: AMPUTATION BELOW KNEE;  Surgeon: [name], MD;  Location: [facility] OR;  Service: Vascular;  Laterality: Left;  . Coronary angioplasty  2002    3 stents  . Endarterectomy femoral Right 03/25/2013    Procedure: ENDARTERECTOMY FEMORAL ;  Surgeon: [name], MD;  Location: [facility] OR;  Service: Vascular;  Laterality: Right;  . Patch angioplasty Right 03/25/2013    Procedure: PATCH ANGIOPLASTY USING VASCU-GUARD PERIPHERAL VASCULAR PATCH;  Surgeon: [name], MD;  Location: [facility] OR;  Service: Vascular;  Laterality: Right;   Social History:  reports that he has quit smoking. His smoking use included Cigarettes. He smoked 0.00 packs per day. He has never used smokeless tobacco. He reports that he does not drink alcohol or use illicit drugs.  Allergies  Allergen Reactions  . Statins     Muscle aches  . Amlodipine Besylate     Muscle aches making difficult to walk  . Cephalexin     REACTION: Hives   Family History  Problem Relation Age of Onset  . Heart disease Mother     before age 74  . Diabetes Mother   . Heart attack Mother   . Heart disease Father     Prior to Admission medications   Medication Sig Start Date End Date Taking? Authorizing Provider  ezetimibe (ZETIA) 10 MG tablet Take 10 mg by mouth daily.   Yes Historical Provider, MD  insulin glargine (LANTUS) 100 UNIT/ML injection Inject 0.5 mLs (50 Units total) into the skin 2 (two) times daily. Please dispense a 90 day supply with refills to complete the year 02/08/13  Yes Etta Grandchild, MD  insulin lispro (HUMALOG) 100 UNIT/ML injection Inject 5 Units into the skin 3 (three) times daily as needed for high blood sugar.    Yes Historical Provider, MD  lisinopril (PRINIVIL,ZESTRIL) 5 MG tablet Take 1 tablet (5 mg total) by mouth daily. 12/01/12 12/01/13 Yes Rollene Rotunda, MD  metoprolol succinate (TOPROL-XL) 50 MG 24 hr tablet Take 1 tablet (50 mg total) by mouth 2 (two) times daily. 02/08/13  Yes Etta Grandchild, MD  nitroGLYCERIN (NITROSTAT) 0.4 MG SL tablet Place 0.4 mg under the tongue every 5 (five) minutes as needed for chest pain.   Yes Historical Provider, MD  Saxagliptin-Metformin (KOMBIGLYZE XR) 2.08-998 MG TB24 Take 1 tablet by mouth daily.   Yes Historical Provider, MD  oxyCODONE-acetaminophen (PERCOCET/ROXICET) 5-325 MG per tablet Take 1 tablet by mouth every 6 (six) hours as needed for severe pain. 03/28/13   [name], PA-C   Physical Exam: Blood pressure 153/67, pulse 88, temperature 99.6  F (37.6 C), temperature source Oral, resp. rate 18, height 5\' 9"  (1.753 m), weight 129.4 kg (285 lb 4.4 oz), SpO2 97.00%. Filed Vitals:   04/13/13 0949  BP: 153/67  Pulse: 88  Temp: 99.6 F (37.6 C)  Resp:      General:  alert  Eyes: EOM-i, perrla   ENT: no oral ulcers   Neck: supple, no JVD  Cardiovascular: s1,s2 rrr  Respiratory: CTA BL  Abdomen: soft, nt, nd   Skin: no rash  Musculoskeletal: R great toe gangrene   Psychiatric: no hallucinations   Neurologic: CN 2-12 intact   Labs on Admission:  Basic Metabolic Panel:  Recent Labs Lab 04/12/13 2155  NA 135*  K 4.3  CL 97  CO2 24  GLUCOSE 207*  BUN 14  CREATININE 1.06  CALCIUM 9.1   Liver Function Tests:  Recent Labs Lab 04/12/13 2155  AST 16  ALT 17  ALKPHOS 119*  BILITOT 0.3  PROT 7.9  ALBUMIN 2.7*   No results found for this basename: LIPASE, AMYLASE,  in the last 168 hours No results found for this basename: AMMONIA,  in the last 168 hours CBC:  Recent Labs Lab 04/12/13 2155  WBC 15.4*  HGB 14.0  HCT 39.4  MCV 87.8  PLT 271   Cardiac Enzymes: No results found for this basename: CKTOTAL, CKMB,  CKMBINDEX, TROPONINI,  in the last 168 hours BNP: No components found with this basename: POCBNP,  CBG:  Recent Labs Lab 04/12/13 2225 04/13/13 0624  GLUCAP 185* 180*    Radiological Exams on Admission: No results found.  EKG: Independently reviewed. Not done   Time spent: >35 minutes   Esperanza SheetsBURIEV, Savio Albrecht N [facility] Pager [phone]  If 7PM-7AM, please contact night-coverage [email or web address] Password [email or web address] 04/13/2013, 11:47 AM

## 2013-04-13 NOTE — Progress Notes (Signed)
Pt has temp of 102.6. Called Dr. Myra GianottiBrabham and received orders for Tylenol and blood cultures. Administered Tylenol per orders.

## 2013-04-13 NOTE — Care Management Note (Unsigned)
    Page 1 of 1   04/13/2013     3:20:48 PM   CARE MANAGEMENT NOTE 04/13/2013  Patient:  Leroy [name], Leroy [name]   Account Number:  [id number]  Date Initiated:  04/13/2013  Documentation initiated by:  Koran Seabrook  Subjective/Objective Assessment:   PT ADM ON 1/5 WITH GANGRENE RT FOOT.  PTA, PT LIVED AT HOME WITH FAMILY.     Action/Plan:   WILL FOLLOW FOR DC NEEDS AS PT PROGRESSES.  VASCULAR FOLLOWING FOR POSSIBLE SURGICAL INTERVENTION.   Anticipated DC Date:  04/16/2013   Anticipated DC Plan:  HOME W HOME HEALTH SERVICES      DC Planning Services  CM consult      Choice offered to / List presented to:             Status of service:  In process, will continue to follow Medicare Important Message given?   (If response is "NO", the following Medicare IM given date fields will be blank) Date Medicare IM given:   Date Additional Medicare IM given:    Discharge Disposition:    Per UR Regulation:  Reviewed for med. necessity/level of care/duration of stay  If discussed at Long Length of Stay Meetings, dates discussed:    Comments:

## 2013-04-13 NOTE — Progress Notes (Signed)
Utilization Review Completed.Leroy [name] T1/09/2013  

## 2013-04-14 ENCOUNTER — Encounter (HOSPITAL_COMMUNITY)
Admission: AD | Disposition: A | Discharge status: Rehabilitation Facility | Payer: Self-pay | Source: Ambulatory Visit | Attending: Vascular Surgery

## 2013-04-14 ENCOUNTER — Encounter (HOSPITAL_COMMUNITY): Payer: Self-pay | Admitting: Anesthesiology

## 2013-04-14 ENCOUNTER — Inpatient Hospital Stay (HOSPITAL_COMMUNITY): Payer: Medicare HMO | Admitting: Certified Registered"

## 2013-04-14 ENCOUNTER — Encounter (HOSPITAL_COMMUNITY): Payer: Medicare HMO | Admitting: Certified Registered"

## 2013-04-14 DIAGNOSIS — T8131XA Disruption of external operation (surgical) wound, not elsewhere classified, initial encounter: Secondary | ICD-10-CM

## 2013-04-14 DIAGNOSIS — I70269 Atherosclerosis of native arteries of extremities with gangrene, unspecified extremity: Secondary | ICD-10-CM

## 2013-04-14 HISTORY — PX: I & D EXTREMITY: SHX5045

## 2013-04-14 HISTORY — PX: AMPUTATION: SHX166

## 2013-04-14 LAB — BASIC METABOLIC PANEL
BUN: 18 mg/dL (ref 6–23)
CO2: 24 mEq/L (ref 19–32)
CREATININE: 1.18 mg/dL (ref 0.50–1.35)
Calcium: 8.5 mg/dL (ref 8.4–10.5)
Chloride: 97 mEq/L (ref 96–112)
GFR, EST AFRICAN AMERICAN: 72 mL/min — AB (ref >90)
GFR, EST NON AFRICAN AMERICAN: 63 mL/min — AB (ref >90)
Glucose, Bld: 303 mg/dL — ABNORMAL HIGH (ref 70–99)
POTASSIUM: 4.2 meq/L (ref 3.7–5.3)
Sodium: 134 mEq/L — ABNORMAL LOW (ref 137–147)

## 2013-04-14 LAB — GLUCOSE, CAPILLARY
GLUCOSE-CAPILLARY: 248 mg/dL — AB (ref 70–99)
GLUCOSE-CAPILLARY: 228 mg/dL — AB (ref 70–99)
Glucose-Capillary: 306 mg/dL — ABNORMAL HIGH (ref 70–99)
Glucose-Capillary: 176 mg/dL — ABNORMAL HIGH (ref 70–99)
Glucose-Capillary: 182 mg/dL — ABNORMAL HIGH (ref 70–99)
Glucose-Capillary: 151 mg/dL — ABNORMAL HIGH (ref 70–99)

## 2013-04-14 LAB — CBC
HEMATOCRIT: 34.4 % — AB (ref 39.0–52.0)
Hemoglobin: 11.7 g/dL — ABNORMAL LOW (ref 13.0–17.0)
MCH: 30 pg (ref 26.0–34.0)
MCHC: 34 g/dL (ref 30.0–36.0)
MCV: 88.2 fL (ref 78.0–100.0)
PLATELETS: 247 10*3/uL (ref 150–400)
RBC: 3.9 MIL/uL — ABNORMAL LOW (ref 4.22–5.81)
RDW: 12.5 % (ref 11.5–15.5)
WBC: 13 10*3/uL — ABNORMAL HIGH (ref 4.0–10.5)

## 2013-04-14 LAB — MRSA PCR SCREENING: MRSA BY PCR: NEGATIVE

## 2013-04-14 SURGERY — AMPUTATION BELOW KNEE
Anesthesia: General | Site: Leg Lower | Laterality: Right

## 2013-04-14 SURGERY — AMPUTATION BELOW KNEE
Anesthesia: General | Laterality: Right

## 2013-04-14 MED ORDER — 0.9 % SODIUM CHLORIDE (POUR BTL) OPTIME
TOPICAL | Status: DC | PRN
  Administered 2013-04-14: 1000 mL

## 2013-04-14 MED ORDER — LIDOCAINE HCL (CARDIAC) 20 MG/ML IV SOLN
INTRAVENOUS | Status: DC | PRN
  Administered 2013-04-14: 100 mg via INTRAVENOUS

## 2013-04-14 MED ORDER — PHENYLEPHRINE HCL 10 MG/ML IJ SOLN
INTRAMUSCULAR | Status: DC | PRN
  Administered 2013-04-14 (×3): 80 ug via INTRAVENOUS

## 2013-04-14 MED ORDER — DILTIAZEM HCL 25 MG/5ML IV SOLN
10.0000 mg | Freq: Once | INTRAVENOUS | Status: AC
  Administered 2013-04-14: 10 mg via INTRAVENOUS
  Filled 2013-04-14: qty 5

## 2013-04-14 MED ORDER — PROPOFOL 10 MG/ML IV BOLUS
INTRAVENOUS | Status: DC | PRN
  Administered 2013-04-14: 150 mg via INTRAVENOUS

## 2013-04-14 MED ORDER — LACTATED RINGERS IV SOLN
INTRAVENOUS | Status: DC
  Administered 2013-04-14: 14:00:00 via INTRAVENOUS

## 2013-04-14 MED ORDER — SODIUM CHLORIDE 0.9 % IR SOLN
Status: DC | PRN
  Administered 2013-04-14: 3000 mL

## 2013-04-14 MED ORDER — FENTANYL CITRATE 0.05 MG/ML IJ SOLN
INTRAMUSCULAR | Status: DC | PRN
  Administered 2013-04-14 (×7): 25 ug via INTRAVENOUS

## 2013-04-14 MED ORDER — OXYCODONE HCL 5 MG PO TABS: 5.0000 mg | ORAL_TABLET | Freq: Once | ORAL | Status: DC | PRN

## 2013-04-14 MED ORDER — EZETIMIBE 10 MG PO TABS
10.0000 mg | ORAL_TABLET | Freq: Every day | ORAL | Status: DC
  Administered 2013-04-14 – 2013-04-21 (×8): 10 mg via ORAL
  Filled 2013-04-14 (×8): qty 1

## 2013-04-14 MED ORDER — ENOXAPARIN SODIUM 40 MG/0.4ML ~~LOC~~ SOLN
40.0000 mg | SUBCUTANEOUS | Status: DC
  Administered 2013-04-14 – 2013-04-16 (×3): 40 mg via SUBCUTANEOUS
  Filled 2013-04-14 (×4): qty 0.4

## 2013-04-14 MED ORDER — LACTATED RINGERS IV SOLN
INTRAVENOUS | Status: DC | PRN
  Administered 2013-04-14: 14:00:00 via INTRAVENOUS

## 2013-04-14 MED ORDER — OXYCODONE HCL 5 MG/5ML PO SOLN: 5.0000 mg | Freq: Once | ORAL | Status: DC | PRN

## 2013-04-14 MED ORDER — MEPERIDINE HCL 25 MG/ML IJ SOLN: 6.2500 mg | INTRAMUSCULAR | Status: DC | PRN

## 2013-04-14 MED ORDER — OXYCODONE HCL 5 MG PO TABS
5.0000 mg | ORAL_TABLET | ORAL | Status: DC | PRN
  Administered 2013-04-15: 10 mg via ORAL
  Administered 2013-04-15: 5 mg via ORAL
  Administered 2013-04-15: 10 mg via ORAL
  Administered 2013-04-15 – 2013-04-16 (×3): 5 mg via ORAL
  Administered 2013-04-16 – 2013-04-21 (×14): 10 mg via ORAL
  Filled 2013-04-14: qty 2
  Filled 2013-04-14 (×3): qty 1
  Filled 2013-04-14 (×2): qty 2
  Filled 2013-04-14: qty 1
  Filled 2013-04-14 (×4): qty 2
  Filled 2013-04-14: qty 1
  Filled 2013-04-14 (×2): qty 2
  Filled 2013-04-14 (×2): qty 1
  Filled 2013-04-14 (×5): qty 2
  Filled 2013-04-14: qty 1
  Filled 2013-04-14: qty 2

## 2013-04-14 MED ORDER — ONDANSETRON HCL 4 MG/2ML IJ SOLN: 4.0000 mg | Freq: Once | INTRAMUSCULAR | Status: DC | PRN

## 2013-04-14 MED ORDER — PHENYLEPHRINE HCL 10 MG/ML IJ SOLN
10.0000 mg | INTRAMUSCULAR | Status: DC | PRN
  Administered 2013-04-14: 20 ug/min via INTRAVENOUS

## 2013-04-14 MED ORDER — HYDROMORPHONE HCL PF 1 MG/ML IJ SOLN: 0.2500 mg | INTRAMUSCULAR | Status: DC | PRN

## 2013-04-14 MED ORDER — ONDANSETRON HCL 4 MG/2ML IJ SOLN
INTRAMUSCULAR | Status: DC | PRN
  Administered 2013-04-14: 4 mg via INTRAVENOUS

## 2013-04-14 MED ORDER — NITROGLYCERIN 0.4 MG SL SUBL: 0.4000 mg | SUBLINGUAL_TABLET | SUBLINGUAL | Status: DC | PRN

## 2013-04-14 SURGICAL SUPPLY — 78 items
BANDAGE ELASTIC 4 VELCRO ST LF (GAUZE/BANDAGES/DRESSINGS) ×4 IMPLANT
BANDAGE ELASTIC 6 VELCRO ST LF (GAUZE/BANDAGES/DRESSINGS) ×4 IMPLANT
BANDAGE ESMARK 6X9 LF (GAUZE/BANDAGES/DRESSINGS) IMPLANT
BANDAGE GAUZE ELAST BULKY 4 IN (GAUZE/BANDAGES/DRESSINGS) ×8 IMPLANT
BLADE SAW SAG 73X25 THK (BLADE) ×2
BLADE SAW SGTL 73X25 THK (BLADE) ×2 IMPLANT
BLADE SURG 10 STRL SS (BLADE) ×2 IMPLANT
BNDG CMPR 9X6 STRL LF SNTH (GAUZE/BANDAGES/DRESSINGS) ×2
BNDG COHESIVE 6X5 TAN STRL LF (GAUZE/BANDAGES/DRESSINGS) ×4 IMPLANT
BNDG ESMARK 6X9 LF (GAUZE/BANDAGES/DRESSINGS) ×4
BNDG GAUZE ELAST 4 BULKY (GAUZE/BANDAGES/DRESSINGS) ×2 IMPLANT
CANISTER SUCTION 2500CC (MISCELLANEOUS) ×4 IMPLANT
CANISTER WOUND CARE 500ML ATS (WOUND CARE) ×2 IMPLANT
CLIP TI MEDIUM 6 (CLIP) IMPLANT
COVER SURGICAL LIGHT HANDLE (MISCELLANEOUS) ×4 IMPLANT
COVER TABLE BACK 60X90 (DRAPES) IMPLANT
CUFF TOURNIQUET SINGLE 24IN (TOURNIQUET CUFF) IMPLANT
CUFF TOURNIQUET SINGLE 34IN LL (TOURNIQUET CUFF) IMPLANT
CUFF TOURNIQUET SINGLE 44IN (TOURNIQUET CUFF) ×2 IMPLANT
DRAIN CHANNEL 19F RND (DRAIN) IMPLANT
DRAPE INCISE IOBAN 66X45 STRL (DRAPES) IMPLANT
DRAPE ORTHO SPLIT 77X108 STRL (DRAPES) ×8
DRAPE PROXIMA HALF (DRAPES) ×4 IMPLANT
DRAPE SURG ORHT 6 SPLT 77X108 (DRAPES) ×4 IMPLANT
DRSG ADAPTIC 3X8 NADH LF (GAUZE/BANDAGES/DRESSINGS) ×4 IMPLANT
DRSG VAC ATS SM SENSATRAC (GAUZE/BANDAGES/DRESSINGS) ×2 IMPLANT
ELECT REM PT RETURN 9FT ADLT (ELECTROSURGICAL) ×4
ELECTRODE REM PT RTRN 9FT ADLT (ELECTROSURGICAL) ×2 IMPLANT
EVACUATOR SILICONE 100CC (DRAIN) IMPLANT
FLUID NSS /IRRIG 3000 ML XXX (IV SOLUTION) ×2 IMPLANT
GLOVE BIO SURGEON STRL SZ7 (GLOVE) ×6 IMPLANT
GLOVE BIOGEL PI IND STRL 6.5 (GLOVE) IMPLANT
GLOVE BIOGEL PI IND STRL 7.0 (GLOVE) IMPLANT
GLOVE BIOGEL PI IND STRL 7.5 (GLOVE) ×2 IMPLANT
GLOVE BIOGEL PI INDICATOR 6.5 (GLOVE) ×2
GLOVE BIOGEL PI INDICATOR 7.0 (GLOVE) ×4
GLOVE BIOGEL PI INDICATOR 7.5 (GLOVE) ×4
GLOVE SS BIOGEL STRL SZ 7 (GLOVE) IMPLANT
GLOVE SUPERSENSE BIOGEL SZ 7 (GLOVE) ×4
GLOVE SURG SS PI 7.0 STRL IVOR (GLOVE) ×4 IMPLANT
GOWN STRL NON-REIN LRG LVL3 (GOWN DISPOSABLE) ×16 IMPLANT
GOWN STRL REIN XL XLG (GOWN DISPOSABLE) ×4 IMPLANT
HANDPIECE INTERPULSE COAX TIP (DISPOSABLE) ×4
KIT BASIN OR (CUSTOM PROCEDURE TRAY) ×4 IMPLANT
KIT ROOM TURNOVER OR (KITS) ×4 IMPLANT
NS IRRIG 1000ML POUR BTL (IV SOLUTION) ×4 IMPLANT
PACK CV ACCESS (CUSTOM PROCEDURE TRAY) IMPLANT
PACK GENERAL/GYN (CUSTOM PROCEDURE TRAY) ×4 IMPLANT
PACK UNIVERSAL I (CUSTOM PROCEDURE TRAY) ×2 IMPLANT
PAD ARMBOARD 7.5X6 YLW CONV (MISCELLANEOUS) ×8 IMPLANT
PADDING CAST COTTON 6X4 STRL (CAST SUPPLIES) IMPLANT
PENCIL BUTTON HOLSTER BLD 10FT (ELECTRODE) ×2 IMPLANT
SET HNDPC FAN SPRY TIP SCT (DISPOSABLE) IMPLANT
SPONGE GAUZE 4X4 12PLY (GAUZE/BANDAGES/DRESSINGS) ×8 IMPLANT
SPONGE GAUZE 4X4 12PLY STER LF (GAUZE/BANDAGES/DRESSINGS) ×2 IMPLANT
SPONGE LAP 18X18 X RAY DECT (DISPOSABLE) ×2 IMPLANT
STAPLER VISISTAT 35W (STAPLE) ×6 IMPLANT
STOCKINETTE IMPERVIOUS LG (DRAPES) ×4 IMPLANT
SUT ETHILON 3 0 PS 1 (SUTURE) IMPLANT
SUT MNCRL AB 4-0 PS2 18 (SUTURE) IMPLANT
SUT SILK 0 TIES 10X30 (SUTURE) IMPLANT
SUT SILK 2 0 (SUTURE) ×8
SUT SILK 2 0 SH CR/8 (SUTURE) ×2 IMPLANT
SUT SILK 2-0 18XBRD TIE 12 (SUTURE) ×2 IMPLANT
SUT SILK 3 0 (SUTURE) ×4
SUT SILK 3-0 18XBRD TIE 12 (SUTURE) ×2 IMPLANT
SUT VIC AB 2-0 CT1 18 (SUTURE) ×14 IMPLANT
SUT VIC AB 2-0 CTX 36 (SUTURE) IMPLANT
SUT VIC AB 3-0 SH 18 (SUTURE) ×4 IMPLANT
SUT VIC AB 3-0 SH 27 (SUTURE) ×8
SUT VIC AB 3-0 SH 27X BRD (SUTURE) IMPLANT
TAPE UMBILICAL COTTON 1/8X30 (MISCELLANEOUS) ×4 IMPLANT
TOWEL OR 17X24 6PK STRL BLUE (TOWEL DISPOSABLE) ×8 IMPLANT
TOWEL OR 17X26 10 PK STRL BLUE (TOWEL DISPOSABLE) ×4 IMPLANT
TUBE CONNECTING 12'X1/4 (SUCTIONS) ×1
TUBE CONNECTING 12X1/4 (SUCTIONS) ×1 IMPLANT
UNDERPAD 30X30 INCONTINENT (UNDERPADS AND DIAPERS) ×4 IMPLANT
WATER STERILE IRR 1000ML POUR (IV SOLUTION) ×4 IMPLANT

## 2013-04-14 NOTE — Consult Note (Signed)
[name]  Leroy [name] TDH:[id number] DOB: 30-Dec-1945 DOA: 04/12/2013 PCP: [name], MD  Assessment/Plan: 1. Right foot gangrene: plan is for amputation by [facility] this afternoon.  Continue management per [facility]. 2. DM uncontrolled:  HbA1C 12.4.  Patient admits to non compliance.  Lantus increased to 60 units BID yesterday, also with SSI.  Suspect that cbg's will decrease after removal of infected foot.  No changes for now. 3. CAD s/p CABG: continue home regimen 4. PAF: on xarelto.  Patient not taking due to price.  Will need to resume. Cont BB 5. CHF chronic stable: cont ACE 6. HTN: fair control. No changes.  7. Prophylaxis: per [facility]VS  Procedures:  Plan for amputation of right foot this afternoon  HPI/Subjective: No acute complaints. Disappointed to have amputation.  Determined to improve DM control.  Objective: Filed Vitals:   04/14/13 0924  BP: 144/58  Pulse: 97  Temp: 98.5 F (36.9 C)  Resp: 18    Intake/Output Summary (Last 24 hours) at 04/14/13 1300 Last data filed at 04/14/13 0732  Gross per 24 hour  Intake    480 ml  Output    950 ml  Net   -470 ml   Filed Weights   04/12/13 1919  Weight: 129.4 kg (285 lb 4.4 oz)    Exam:   General:  NAD  Cardiovascular: RRR no mrg  Respiratory: CTAB  Musculoskeletal: darkened gangrenous right great toe and foot with odor   Data Reviewed: Basic Metabolic Panel:  Recent Labs Lab 04/12/13 2155 04/14/13 0542  NA 135* 134*  K 4.3 4.2  CL 97 97  CO2 24 24  GLUCOSE 207* 303*  BUN 14 18  CREATININE 1.06 1.18  CALCIUM 9.1 8.5   Liver Function Tests:  Recent Labs Lab 04/12/13 2155  AST 16  ALT 17  ALKPHOS 119*  BILITOT 0.3  PROT 7.9  ALBUMIN 2.7*   No results found for this basename: LIPASE, AMYLASE,  in the last 168 hours No results found for this basename: AMMONIA,  in the last 168 hours CBC:  Recent Labs Lab 04/12/13 2155 04/14/13 0542  WBC 15.4* 13.0*  HGB 14.0 11.7*   HCT 39.4 34.4*  MCV 87.8 88.2  PLT 271 247   Cardiac Enzymes: No results found for this basename: CKTOTAL, CKMB, CKMBINDEX, TROPONINI,  in the last 168 hours BNP (last 3 results)  Recent Labs  08/04/12 1232  PROBNP 688.1*   CBG:  Recent Labs Lab 04/13/13 1133 04/13/13 1637 04/13/13 2054 04/14/13 0632 04/14/13 1126  GLUCAP 135* 197* 265* 248* 306*    No results found for this or any previous visit (from the past 240 hour(s)).   Studies: No results found.  Scheduled Meds: . aztreonam  2 g Intravenous Q8H  . collagenase   Topical Daily  . enoxaparin (LOVENOX) injection  40 mg Subcutaneous Q24H  . insulin aspart  0-15 Units Subcutaneous TID WC  . insulin aspart  0-5 Units Subcutaneous QHS  . insulin aspart  3 Units Subcutaneous TID WC  . insulin glargine  60 Units Subcutaneous BID  . lisinopril  5 mg Oral Daily  . metoprolol succinate  50 mg Oral BID  . pantoprazole  40 mg Oral Daily  . sodium chloride  3 mL Intravenous Q12H  . vancomycin  1,250 mg Intravenous Q12H   Continuous Infusions:   Active Problems:   CAD   Type II or unspecified type diabetes mellitus with neurological manifestations, uncontrolled(250.62)  Critical lower limb ischemia   Gangrene of foot    Time spent: 20 minutes    St. John OwassoWALSH,CATHERINE  [facility] Pager [phone]. If 7PM-7AM, please contact night-coverage at [email or web address], password [email or web address] 04/14/2013, 1:00 PM  LOS: 2 days

## 2013-04-14 NOTE — Anesthesia Procedure Notes (Signed)
Procedure Name: LMA Insertion Date/Time: 04/14/2013 2:36 PM Performed by: Jerilee HohMUMM, Teniola Tseng N Pre-anesthesia Checklist: Patient identified, Emergency Drugs available, Suction available and Patient being monitored Patient Re-evaluated:Patient Re-evaluated prior to inductionOxygen Delivery Method: Circle system utilized Preoxygenation: Pre-oxygenation with 100% oxygen Intubation Type: IV induction LMA: LMA inserted LMA Size: 5.0 Tube type: Oral Number of attempts: 1 Placement Confirmation: positive ETCO2 and breath sounds checked- equal and bilateral Tube secured with: Tape Dental Injury: Teeth and Oropharynx as per pre-operative assessment

## 2013-04-14 NOTE — Interval H&P Note (Signed)
[facility] [place]  History and Physical Update  The patient was interviewed and re-examined.  The patient's previous History and Physical has been reviewed and is unchanged except for: development of high grade fever suggestive of bacterema.  At this point, I have suggested R BKA vs AKA and  R groin debridement and washout to address this patient's bacteremia with source control.    [name], MD [facility] [place] Office: [phone] Pager: [phone]  04/14/2013, 11:52 AM

## 2013-04-14 NOTE — Op Note (Signed)
OPERATIVE NOTE   PROCEDURE: 1. Right below-the-knee amputation 2. Right groin wound debridement with washout 3. Placement of negative pressure dressing right groin wound  PRE-OPERATIVE DIAGNOSIS: right foot gangrene, superificial right groin dehiscence   POST-OPERATIVE DIAGNOSIS: same as above  SURGEON: [name], MD  ASSISTANT(S): Lianne Cure, PAC   ANESTHESIA: general  ESTIMATED BLOOD LOSS: 100 cc  FINDING(S): 1.  Viable muscle in right calf with good bleeding 2.  No healing in superficial fat in right groin wound 3.  Deep superficial subcutaneous tissue healing  SPECIMEN(S):  right below-the-knee amputation  INDICATIONS:   Leroy [name] is a 68 y.o. male who presents with right foot gangrene and superficial wound dehiscence in right groin from recent iliofemoral endarterectomy with bovine patch angioplasty.  Unfortunately, since his prior procedure, the patient has had progression in right foot ischemia with frankly dead great toe now.  He began having high grade fevers overnight, so I had concern that he was becoming bacteremic from his dead toes and would develop sepsis shortly.  Subsequently, I convinced the patient to proceed with right below-the-knee amputation and debridement of the right groin wound.  I discussed in depth with the patient the risks, benefits, and alternatives to this procedure.  The patient is aware that the risk of this operation included but are not limited to:  bleeding, infection, myocardial infarction, stroke, death, failure to heal amputation wound, and possible need for more proximal amputation.  The patient is aware of the risks and agrees proceed forward with the procedure.  DESCRIPTION:  After full informed written consent was obtained from the patient, the patient was brought back to the operating room, and placed supine upon the operating table.  Prior to induction, the patient had received IV antibiotics just before transport to the  operating as part of his therapeutic regimen.  The patient was then prepped and draped in the standard fashion for a below-the-knee amputation.  I placed a non-sterile tourniquet on the thigh prior to the procedure.  After obtaining adequate anesthesia, the patient was prepped and draped in the standard fashion for a below-the-knee amputation.  I marked out the anterior incision 10 cm distal to the tibial tuberosity and then the marked out a posterior flap that was one third of the circumference of the calf in length.  I then exsanguinated the leg with a Esmarch bandage and then inflated the tourniquet to 250 mm Hg.   I made the incisions for these flaps, and then dissected through the subcutaneous tissue, fascia, and muscle anteriorly.  I elevated the periosteal tissue superiorly so that the tibia was about 3 cm shorter than the anterior skin flap.  I then transected the tibia with a power saw and then took a wedge off the tibia anteriorly with the power saw.  Then I smoothed out the rough edges with a rasp.  In a similar fashion, I cut back the fibula about two centimeters higher than the level of the tibia with a bone cutter. I developed the posterior muscle flap with electrocatuery.  At this point, the specimen was passed off the field as the below-the-knee amputation.  At this point, I clamped all visibly bleeding arteries and veins using a combination of suture ligation with Vicryl suture and electrocautery.  The tourniquet was then deflated at this point.  Bleeding continued to be controlled with electrocautery and suture ligature.  The stump was washed off with sterile normal saline and no further active bleeding was  noted.  I reapproximated the anterior and posterior fascia  with interrupted stitches of 2-0 Vicryl.  This was completed along the entire length of anterior and posterior fascia until there were no more loose space in the fascial line.  The skin was then  reapproximated with staples.  The stump  was washed off and dried.  The incision was dressed with Adpatec and  then fluffs were applied.  Kerlix was wrapped around the leg and then gently an ACE wrap was applied.    At this point, the drapes were taken down and the right groin was prepped and draped in a fashion for a groin exploration.  I first sharply freshened the dead skin edges in the right groin incision.  I then bluntly explored the wound.  It was evident that the superficial fat had not healed at all.  I opened the superior half of the incision and bluntly probed the floor of this wound.  The deep subcutaneous tissue felt intact.  I irrigated out the right groin incision with 3 L of sterile normal saline with a Pulsavac.  I then fashioned a VAC sponge for the geometry of this right groin wound.  It was affix with the sterile adhesive strips.  I cut a hole in the center for the lilypad and attached it to the sponge.  The [facility] Center[facility] circuit was passed off the field and attached to the VAC pump set at 125 continuous.  The sponge became adherent without any leaks present.    COMPLICATIONS: none  CONDITION: stable   Leonides SakeBrian Kynzleigh Bandel, MD [facility] [place] Office: [phone] Pager: [phone]  04/14/2013, 4:51 PM

## 2013-04-14 NOTE — Anesthesia Postprocedure Evaluation (Signed)
Anesthesia Post Note  Patient: Leroy GriffithsWilliam J [name]  Procedure(s) Performed: Procedure(s) (LRB): AMPUTATION BELOW KNEE  (Right) IRRIGATION AND DEBRIDEMENT OF RIGHT  GROIN & PLACEMENT OF VAC DRESSING (Right)  Anesthesia type: General  Patient location: PACU  Post pain: Pain level controlled and Adequate analgesia  Post assessment: Post-op Vital signs reviewed, Patient's Cardiovascular Status Stable, Respiratory Function Stable, Patent Airway and Pain level controlled  Last Vitals:  Filed Vitals:   04/14/13 1800  BP: 98/61  Pulse: 83  Temp:   Resp: 16    Post vital signs: Reviewed and stable  Level of consciousness: awake, alert  and oriented  Complications: No apparent anesthesia complications

## 2013-04-14 NOTE — Progress Notes (Signed)
Inpatient Diabetes Program Recommendations  AACE/ADA: New Consensus Statement on Inpatient Glycemic Control (2013)  Target Ranges:  Prepandial:   less than 140 mg/dL      Peak postprandial:   less than 180 mg/dL (1-2 hours)      Critically ill patients:  140 - 180 mg/dL  Results for Leroy [name], Echo J (MRN [id number]) as of 04/14/2013 13:05  Ref. Range 04/13/2013 11:33 04/13/2013 16:37 04/13/2013 20:54 04/14/2013 06:32 04/14/2013 11:26  Glucose-Capillary Latest Range: 70-99 mg/dL 409135 (H) 811197 (H) 914265 (H) 248 (H) 306 (H)   Inpatient Diabetes Program Recommendations Insulin - Basal: likely needs at least partial dose of a.m. Lantus while NPO Correction (SSI): change to Q 4 during NPO Thank you  Piedad ClimesGina Aidynn Polendo BSN, RN,CDE Inpatient Diabetes Coordinator [phone] (team pager)

## 2013-04-14 NOTE — Transfer of Care (Signed)
Immediate Anesthesia Transfer of Care Note  Patient: Leroy GriffithsWilliam J [name]  Procedure(s) Performed: Procedure(s): AMPUTATION BELOW KNEE  (Right) IRRIGATION AND DEBRIDEMENT OF RIGHT  GROIN & PLACEMENT OF VAC DRESSING (Right)  Patient Location: PACU  Anesthesia Type:General  Level of Consciousness: awake, alert  and oriented  Airway & Oxygen Therapy: Patient Spontanous Breathing and Patient connected to nasal cannula oxygen  Post-op Assessment: Report given to PACU RN and Post -op Vital signs reviewed and stable  Post vital signs: Reviewed and stable  Complications: No apparent anesthesia complications

## 2013-04-14 NOTE — Progress Notes (Addendum)
[facility] [place]  Subjective  - He may be willing to have the right BKA.  He wants to talk to Dr. Imogene Burn today and make his decision. Assessment/Planning  POD #19 s/p R iliofemoral EA w/ BPA, progression of baseline right foot ischemia Right foot gangrene: progression in gangrene in evident with now frankly dead great toe and development of ischemic changes in R 4th and 5th toes. There are also ischemic changes on the plantar surface, so I doubt the right foot is salvagable. I have offered the patient a R BKA. He is going to consider such, though previously he absolutely refused to consider any further amputation Uncontrolled DM with complications including PAD: the patient continues to be uncontrolled, for a multitude of reasons including non-compliance. I suspect ultimately his uncontrolled diabetes will result in additional complications. S/p R iliofemoral EA w/ BPA: Patient has severe R SFA stenosis based on intraoperative findings in addition to the significant common femoral artery and profunda femoral artery findngs. The poor healing at the right groin is not surprising given this patient's poor DM control and obesity overlying his right groin. Will manage the wound with Santyl and wound care for now. If it worsens, surgical debridement and VAC placement likely needed.  Nursing was unable to get IV access yesterday.  PICC line was placed 04/13/2013.   Objective 154/67 87 98.4 F (36.9 C) (Oral) 18 98%  Intake/Output Summary (Last 24 hours) at 04/14/13 0903 Last data filed at 04/14/13 0732  Gross per 24 hour  Intake    600 ml  Output   1250 ml  Net   -650 ml   R groin: superficial separation, no signs of cellulitis or drainage, desiccated fat; R foot: dead great toe, ulceration of 4th/5th and 3rd/4th digit interspace, no drain, ischemic plantar surface overlying 1st and 2nd MT No change in wounds   Heart RRR Lungs CTA bil.   Clinton Gallant  [facility] 04/14/2013 9:03 AM --  Laboratory Lab Results:  Recent Labs  04/12/13 2155 04/14/13 0542  WBC 15.4* 13.0*  HGB 14.0 11.7*  HCT 39.4 34.4*  PLT 271 247   BMET  Recent Labs  04/12/13 2155 04/14/13 0542  NA 135* 134*  K 4.3 4.2  CL 97 97  CO2 24 24  GLUCOSE 207* 303*  BUN 14 18  CREATININE 1.06 1.18  CALCIUM 9.1 8.5    COAG Lab Results  Component Value Date   INR 1.17 04/12/2013   INR 0.97 03/23/2013   INR 2.01* 08/04/2012   No results found for this basename: PTT   Addendum  I have independently interviewed and examined the patient, and I agree with the physician assistant's findings.  With high grade fever, I am now concerned with bacteremia.  I recommend: R BKA vs AKA and right groin exploration, possible debridement and washout.  I discussed in depth the nature of below-the-knee amputation with the patient, including risks, benefits, and alternatives.  The patient is aware that the risks of below-the-knee amputation include but are not limited to: bleeding, infection, myocardial infarction, stroke, death, failure to heal amputation wound, and possible need for more proximal amputation.  In regards to the right groin debridement, the patient is aware risks include: bleeding, infection, need to remove the artery if infected, and need for serial debridement and other indicated procedures.  The patient is aware of the risks and agrees proceed forward with the procedure.   Leroy Sake, MD [facility]  [place] Office: [phone] Pager: [phone]  04/14/2013, 10:38 AM

## 2013-04-14 NOTE — Preoperative (Signed)
Beta Blockers   Reason not to administer Beta Blockers:Not Applicable 

## 2013-04-14 NOTE — H&P (View-Only) (Signed)
[facility] [place] HISTORY AND PHYSICAL -PAD  History of Present Illness Leroy [name] is a 68 y.o. male patient that underwent a right iliofemoral endarterectomy with bovine patch angioplasty on 03/25/2013 by Dr. Imogene Burn. He returns today for lower half of right foot worsening pain and worsening gangrene of right great toe. He states pain in right foot with walking but denies pain in right calf or thigh with walking, denies pain in left thigh with walking, denies problem with left BKA stump, uses his prosthesis. He is diabetic with labile glycemic control as he reports, he does not smoke, he does not have CRF. 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   Pt meds include: Statin :No, documented as due to myalgias  Other anticoagulants/antiplatelets: was taking Xaralto samples from his PCP, but when samples ran out he took no more, is not taking any ASA or Plavix, nor coumadin. Dr. Nicky Pugh note from last month indicates that he is not on an anticoagulant of antiplatelet medication likely due to fall risk.  Past Medical History  Diagnosis Date  . Hyperlipidemia   . Ischemic cardiomyopathy     a. EF 40% 2010. b. Echo (2/14):  mild LVH, EF 30-35%, diff HK, Gr 1 DD, MAC, mild MR, mod LAE, PASP 39  . Cataract   . Hypotension   . Paroxysmal atrial fibrillation   . Peripheral vascular disease     a. s/p L BKA 2014.  [name] History of blood clots     in legs--this was in 2011--has been off of Coumadin 27months;takes Xarelto daily  . Peripheral neuropathy   . Joint pain   . H/O hiatal hernia   . Chronic kidney disease     on Lisinopril to protect kidneys d/t being on Metformin per pt  . Myocardial infarction     total of 8 heart attacks;last one in 2011  . Atrial flutter   . H/O medication noncompliance     Due to insurance issues  . Chronic systolic CHF (congestive heart failure)   . Coronary artery disease     a. s/p remote CABG 2001 at [facility] b. Cath 03/2010: for med rx.;  c.   [name] myoview (1/14):  Large Inferior apical MI, very small area of peri-infarct ischemia toward the inferior apical segment. EF 19%.  Med Rx was continued.    . Diabetes mellitus 1979    takes Metformni daily  and Lantus 50units bid    Social History History  Substance Use Topics  . Smoking status: Former Smoker    Types: Cigarettes  . Smokeless tobacco: Never Used     Comment: quit smoking 81yrs ago  . Alcohol Use: No     Comment: occasionally    Family History Family History  Problem Relation Age of Onset  . Heart disease Mother     before age 66  . Diabetes Mother   . Heart attack Mother   . Heart disease Father     Past Surgical History  Procedure Laterality Date  . Revasculariztion  2011/2010    x 2   . Coronary artery bypass graft  03/2000    quadruple  . Tonsillectomy    . Hernia repair    . Cardiac catheterization  03/19/10  . Abdominal aortagram  04-30-12  . Adenoidectomy    . Amputation Left 06/03/2012    Procedure: AMPUTATION BELOW KNEE;  Surgeon: [name], MD;  Location: [facility] OR;  Service: Vascular;  Laterality: Left;  .  Coronary angioplasty  2002    3 stents  . Endarterectomy femoral Right 03/25/2013    Procedure: ENDARTERECTOMY FEMORAL ;  Surgeon: [name], MD;  Location: [facility] OR;  Service: Vascular;  Laterality: Right;  . Patch angioplasty Right 03/25/2013    Procedure: PATCH ANGIOPLASTY USING VASCU-GUARD PERIPHERAL VASCULAR PATCH;  Surgeon: [name], MD;  Location: [facility] OR;  Service: Vascular;  Laterality: Right;    Allergies  Allergen Reactions  . Statins     Muscle aches  . Amlodipine Besylate     Muscle aches making difficult to walk  . Cephalexin     REACTION: Hives    Current Outpatient Prescriptions  Medication Sig Dispense Refill  . insulin glargine (LANTUS) 100 UNIT/ML injection Inject 0.5 mLs (50 Units total) into the skin 2 (two) times daily. Please dispense a 90 day supply with refills to complete the year  10 pen  11  .  insulin lispro (HUMALOG) 100 UNIT/ML injection Inject 5 Units into the skin 3 (three) times daily as needed for high blood sugar.      . lisinopril (PRINIVIL,ZESTRIL) 5 MG tablet Take 1 tablet (5 mg total) by mouth daily.  30 tablet  11  . metoprolol succinate (TOPROL-XL) 50 MG 24 hr tablet Take 1 tablet (50 mg total) by mouth 2 (two) times daily.  60 tablet  11  . nitroGLYCERIN (NITROSTAT) 0.4 MG SL tablet Place 0.4 mg under the tongue every 5 (five) minutes as needed for chest pain.      [name] oxyCODONE-acetaminophen (PERCOCET/ROXICET) 5-325 MG per tablet Take 1 tablet by mouth every 6 (six) hours as needed for severe pain.  30 tablet  0  . KOMBIGLYZE XR 2.08-998 MG TB24       . ZETIA 10 MG tablet        No current facility-administered medications for this visit.    ROS: See HPI for pertinent positives and negatives.   Physical Examination  Filed Vitals:   04/12/13 1609  BP: 130/60  Pulse: 88  Resp: 16   Filed Weights   04/12/13 1609  Weight: 302 lb (136.986 kg)   Body mass index is 44.58 kg/(m^2).   General: A&O x 3, WDWN, morbidly obese. Gait: with left BKA prosthesis  Eyes: no gross abnormalities Pulmonary: CTAB, without wheezes , rales or rhonchi Cardiac: regular Rythm , without murmur          Carotid Bruits Left Right   Negative Negative                           VASCULAR EXAM: Extremities with ischemic changes: gangrene of right great toe, dark/dusky toes 2-5. Left BKA with prosthesis in place.  Maceration between right 3rd and 4th toes.                                                                                                          LE Pulses LEFT RIGHT       FEMORAL   palpable  not  palpable; surgical incision edges are not proximated, white moist material in incision, no purulence, no redness, no swelling        POPLITEAL  not palpable   not palpable       POSTERIOR TIBIAL  BKA   not palpable        DORSALIS PEDIS      ANTERIOR TIBIAL BKA  not  palpable    Abdomen: soft, NT, no masses. Skin: no lesions other than as mentioned under extrenities. Musculoskeletal: no muscle wasting or atrophy.  Neurologic: A&O X 3; Appropriate Affect ; SENSATION: normal; MOTOR FUNCTION:  moving all extremities equally, motor strength 5/5 throughout except for RLE at 4/5. Speech is fluent/normal. CN grossly intact.    Non-Invasive Vascular Imaging: DATE: 04/12/2013  DUPLEX SCAN OF BYPASS: Despite recent intervention, no change in RLE arterial disease.  Velocity and ratio of SFA origin is likely underestimated due to difficult visualization (angioplasty incision infection).  Previous angiogram: Dr. Nicky Pughhen's note from 03/19/13: recent angiogram demonstrates: >90% calcified distal common femoral artery stenosis which is compromising femoral blood flow and near occluded distal arteries. Only runoff is via AT and it distally is nearly occluded. There is no bypass option in this patient. Only option is a R femoral artery endarterectomy and patch angioplasty. This might improve inflow enough to heal his skin and relieve some of the rest pain. I have scheduled this for 18 DEC 14.  ASSESSMENT: Leroy [name] is a 68 y.o. male who is s/p right iliofemoral endarterectomy with bovine patch angioplasty on 03/25/2013 and left BKA Feb., 2014. He presents with gangrenous right great toe and severely ischemic right 2-5 toes, this has progressed over the last 2 weeks, per patient. He has pain in the lower half of his right foot. The right groin surgical incision edges are not proximated, no purulence, no redness, no swelling, but there is a small amount of white necrotic material in the incision, right femoral pulse is not palpable.  His A1C two weeks ago was 12.4%, uncontrolled DM.  His creatinine two weeks ago was WNL at 1.15.  PLAN:  Based on the patient's vascular studies and examination, and after discussing with Dr. Myra GianottiBrabham, patient is to be directly admitted [facility] for IV antibiotics , pain control, and possible surgical amputation of part of RLE. Hospital admission orders submitted, I spoke with Dr. Arbie CookeyEarly re the patient's status and admission, and asked him to review the admission orders.   The patient was given information about PAD including signs, symptoms, treatment, what symptoms should prompt the patient to seek immediate medical care, and risk reduction measures to take.  Charisse MarchSuzanne Minnie Legros, RN, MSN, FNP-C [facility] [place] Office Phone: [phone]  Clinic MD: Myra GianottiBrabham  04/12/2013 4:47 PM

## 2013-04-14 NOTE — Anesthesia Preprocedure Evaluation (Signed)
Anesthesia Evaluation  Patient identified by MRN, date of birth, ID band Patient awake    Reviewed: Allergy & Precautions, H&P , NPO status , Patient's Chart, lab work & pertinent test results  Airway Mallampati: I TM Distance: >3 FB Neck ROM: Full    Dental   Pulmonary former smoker,          Cardiovascular + CAD, + Past MI and +CHF + dysrhythmias Atrial Fibrillation     Neuro/Psych    GI/Hepatic   Endo/Other  diabetes, Type 2, Insulin Dependent  Renal/GU CRFRenal disease     Musculoskeletal   Abdominal   Peds  Hematology   Anesthesia Other Findings   Reproductive/Obstetrics                           Anesthesia Physical Anesthesia Plan  ASA: III  Anesthesia Plan: General   Post-op Pain Management:    Induction: Intravenous  Airway Management Planned: LMA  Additional Equipment:   Intra-op Plan:   Post-operative Plan: Extubation in OR  Informed Consent: I have reviewed the patients History and Physical, chart, labs and discussed the procedure including the risks, benefits and alternatives for the proposed anesthesia with the patient or authorized representative who has indicated his/her understanding and acceptance.     Plan Discussed with: CRNA and Surgeon  Anesthesia Plan Comments:         Anesthesia Quick Evaluation

## 2013-04-15 ENCOUNTER — Encounter: Payer: Medicare HMO | Admitting: Physician Assistant

## 2013-04-15 DIAGNOSIS — I5022 Chronic systolic (congestive) heart failure: Secondary | ICD-10-CM | POA: Insufficient documentation

## 2013-04-15 LAB — CBC WITH DIFFERENTIAL/PLATELET
BASOS PCT: 0 % (ref 0–1)
Basophils Absolute: 0 10*3/uL (ref 0.0–0.1)
EOS PCT: 1 % (ref 0–5)
Eosinophils Absolute: 0.1 10*3/uL (ref 0.0–0.7)
HEMATOCRIT: 37 % — AB (ref 39.0–52.0)
HEMOGLOBIN: 12.4 g/dL — AB (ref 13.0–17.0)
LYMPHS PCT: 16 % (ref 12–46)
Lymphs Abs: 1.9 10*3/uL (ref 0.7–4.0)
MCH: 30.2 pg (ref 26.0–34.0)
MCHC: 33.5 g/dL (ref 30.0–36.0)
MCV: 90 fL (ref 78.0–100.0)
MONO ABS: 1.5 10*3/uL — AB (ref 0.1–1.0)
Monocytes Relative: 12 % (ref 3–12)
Neutro Abs: 8.9 10*3/uL — ABNORMAL HIGH (ref 1.7–7.7)
Neutrophils Relative %: 71 % (ref 43–77)
Platelets: 277 10*3/uL (ref 150–400)
RBC: 4.11 MIL/uL — ABNORMAL LOW (ref 4.22–5.81)
RDW: 12.7 % (ref 11.5–15.5)
WBC: 12.4 10*3/uL — ABNORMAL HIGH (ref 4.0–10.5)

## 2013-04-15 LAB — BASIC METABOLIC PANEL
BUN: 20 mg/dL (ref 6–23)
CHLORIDE: 96 meq/L (ref 96–112)
CO2: 26 mEq/L (ref 19–32)
CREATININE: 1.33 mg/dL (ref 0.50–1.35)
Calcium: 8.7 mg/dL (ref 8.4–10.5)
GFR calc Af Amer: 63 mL/min — ABNORMAL LOW (ref >90)
GFR calc non Af Amer: 54 mL/min — ABNORMAL LOW (ref >90)
Glucose, Bld: 209 mg/dL — ABNORMAL HIGH (ref 70–99)
Potassium: 4.8 mEq/L (ref 3.7–5.3)
Sodium: 133 mEq/L — ABNORMAL LOW (ref 137–147)

## 2013-04-15 LAB — GLUCOSE, CAPILLARY
GLUCOSE-CAPILLARY: 105 mg/dL — AB (ref 70–99)
GLUCOSE-CAPILLARY: 103 mg/dL — AB (ref 70–99)
Glucose-Capillary: 173 mg/dL — ABNORMAL HIGH (ref 70–99)
Glucose-Capillary: 194 mg/dL — ABNORMAL HIGH (ref 70–99)
Glucose-Capillary: 171 mg/dL — ABNORMAL HIGH (ref 70–99)

## 2013-04-15 MED ORDER — INSULIN GLARGINE 100 UNIT/ML ~~LOC~~ SOLN
62.0000 [IU] | Freq: Two times a day (BID) | SUBCUTANEOUS | Status: DC
  Administered 2013-04-15 – 2013-04-17 (×5): 62 [IU] via SUBCUTANEOUS
  Filled 2013-04-15 (×7): qty 0.62

## 2013-04-15 NOTE — Progress Notes (Signed)
Pt heart sinus tach- afib. Prn doses of metoprolol given. No change. Called dr. Arbie Cookey, made aware of pt heart rate, unable to obtain manuel bp, but MAP was120. Orders to give cardizem. Pt converted to nsr. Monitoring will continue.

## 2013-04-15 NOTE — Consult Note (Signed)
[name]  Leroy GriffithsWilliam J [name] ZOX:[id number] DOB: December 31, 1945 DOA: 04/12/2013 PCP: [name], MD  Assessment/Plan: 1. 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 DM uncontrolled:  HbA1C 12.4.  Patient admits to non compliance.  Lantus increased to 60 units  yesterday, also with SSI.  Suspect that cbg's will decrease after removal of infected foot.  For now increase lantus to 62 u  3. CAD s/p CABG: continue home regimen. Cardiology has seen and he will follow up as outpatient.  4. PAF: on xarelto.  Patient not taking due to price.  Will need to resume. Cont BB. Had episode of afib RVR overnight now converted back to NSR. 5. CHF chronic stable: cont ACE 6. HTN: fair control. No changes.  7. Prophylaxis: per VVS  Procedures:  Plan for amputation of right foot this afternoon  HPI/Subjective: No acute complaints. Disappointed to have amputation.  Determined to improve DM control.  Objective: Filed Vitals:   04/15/13 0522  BP: 121/61  Pulse: 93  Temp: 99.9 F (37.7 C)  Resp: 20    Intake/Output Summary (Last 24 hours) at 04/15/13 0908 Last data filed at 04/15/13 0138  Gross per 24 hour  Intake   1040 ml  Output    750 ml  Net    290 ml   Filed Weights   04/12/13 1919  Weight: 129.4 kg (285 lb 4.4 oz)    Exam:   General:  NAD, groggy  Cardiovascular: RRR no mrg  Respiratory: CTAB  Musculoskeletal: right BKA dressed with wound vac   Data Reviewed: Basic Metabolic Panel:  Recent Labs Lab 04/12/13 2155 04/14/13 0542 04/15/13 0510  NA 135* 134* 133*  K 4.3 4.2 4.8  CL 97 97 96  CO2 24 24 26   GLUCOSE 207* 303* 209*  BUN 14 18 20   CREATININE 1.06 1.18 1.33  CALCIUM 9.1 8.5 8.7   Liver Function Tests:  Recent Labs Lab 04/12/13 2155  AST 16  ALT 17  ALKPHOS 119*  BILITOT 0.3  PROT 7.9  ALBUMIN 2.7*   No results found for this basename: LIPASE, AMYLASE,  in the last 168 hours No results found for this basename: AMMONIA,  in the  last 168 hours CBC:  Recent Labs Lab 04/12/13 2155 04/14/13 0542 04/15/13 0510  WBC 15.4* 13.0* 12.4*  NEUTROABS  --   --  8.9*  HGB 14.0 11.7* 12.4*  HCT 39.4 34.4* 37.0*  MCV 87.8 88.2 90.0  PLT 271 247 277   Cardiac Enzymes: No results found for this basename: CKTOTAL, CKMB, CKMBINDEX, TROPONINI,  in the last 168 hours BNP (last 3 results)  Recent Labs  08/04/12 1232  PROBNP 688.1*   CBG:  Recent Labs Lab 04/14/13 1419 04/14/13 1516 04/14/13 1701 04/14/13 2005 04/15/13 0603  GLUCAP 176* 182* 151* 173* 194*    Recent Results (from the past 240 hour(s))  CULTURE, BLOOD (ROUTINE X 2)     Status: None   Collection Time    04/13/13  9:35 PM      Result Value Range Status   Specimen Description BLOOD LEFT ARM   Final   Special Requests BOTTLES DRAWN AEROBIC ONLY 10CC   Final   Culture  Setup Time     Final   Value: 04/14/2013 01:48     Performed at [facility]   Culture     Final   Value:        BLOOD CULTURE RECEIVED NO GROWTH TO DATE CULTURE  WILL BE HELD FOR 5 DAYS BEFORE ISSUING A FINAL NEGATIVE REPORT     Performed at [facility]   Report Status PENDING   Incomplete  CULTURE, BLOOD (ROUTINE X 2)     Status: None   Collection Time    04/13/13  9:40 PM      Result Value Range Status   Specimen Description BLOOD LEFT HAND   Final   Special Requests BOTTLES DRAWN AEROBIC ONLY 4CC   Final   Culture  Setup Time     Final   Value: 04/14/2013 01:50     Performed at [facility]   Culture     Final   Value:        BLOOD CULTURE RECEIVED NO GROWTH TO DATE CULTURE WILL BE HELD FOR 5 DAYS BEFORE ISSUING A FINAL NEGATIVE REPORT     Performed at [facility]   Report Status PENDING   Incomplete  MRSA PCR SCREENING     Status: None   Collection Time    04/14/13  1:39 PM      Result Value Range Status   MRSA by PCR NEGATIVE  NEGATIVE Final   Comment:            The GeneXpert MRSA Assay (FDA     approved for NASAL specimens      only), is one component of a     comprehensive MRSA colonization     surveillance program. It is not     intended to diagnose MRSA     infection nor to guide or     monitor treatment for     MRSA infections.     Studies: No results found.  Scheduled Meds: . aztreonam  2 g Intravenous Q8H  . collagenase   Topical Daily  . enoxaparin (LOVENOX) injection  40 mg Subcutaneous Q24H  . ezetimibe  10 mg Oral Daily  . insulin aspart  0-15 Units Subcutaneous TID WC  . insulin aspart  0-5 Units Subcutaneous QHS  . insulin aspart  3 Units Subcutaneous TID WC  . insulin glargine  60 Units Subcutaneous BID  . lisinopril  5 mg Oral Daily  . metoprolol succinate  50 mg Oral BID  . pantoprazole  40 mg Oral Daily  . sodium chloride  3 mL Intravenous Q12H  . vancomycin  1,250 mg Intravenous Q12H   Continuous Infusions: . 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 If 7PM-7AM, please contact night-coverage at [email or web address], password [facility] 04/15/2013, 9:08 AM  LOS: 3 days

## 2013-04-15 NOTE — Consult Note (Signed)
WOC wound consult note Reason for Consult: Pt had surgery yesterday for debridement of right groin wound and is followed by [facility] service.  Requested to change vac on 1/9.  Plan to replace dressing after initial post-op dressing is performed by PA or physician. Please page [phone] for WOC assistance tomorrow when desired. Wound type: Full thickness post-op incision.  Vac intact with good seal to cont suction. Small amt pink drainage in cannister. Supplies ordered to room. Cammie Mcgee MSN, RN, CWOCN, [place], CNS [phone]

## 2013-04-15 NOTE — Progress Notes (Signed)
This encounter was created in error - please disregard.

## 2013-04-15 NOTE — Progress Notes (Deleted)
[address], [address] [place], [place]  [address] Phone: [phone] Fax:  [phone]  Date:  04/15/2013   ID:  Leroy, [name] 02-Jan-1946, MRN [id number]  PCP:  [name], MD  Primary Cardiologist:  Dr. Rollene Rotunda     History of Present Illness: Leroy [name] is a 68 y.o. male with a hx of CAD, s/p CABG in 2001, DM2, HL, atrial fibrillation, ischemic cardiomyopathy, systolic CHF. LHC 12/11: SVG-OM patent, SVG-RCA occluded, native RCA with severe diffuse disease and chronic total occlusion, LIMA-LAD patent, medical therapy pursued.  Echo (05/2012):  mild LVH, EF 30-35%, diff HK, Gr 1 DD, MAC, mild MR, mod LAE, PASP 39.  Lexiscan myoview (04/2012):  Large Inferior apical MI, very small area of peri-infarct ischemia toward the inferior apical segment. EF 19%.  Med Rx was continued.  He had a non-healing leg wound and is s/p L BKA.  He was evaluated in the hospital in 05/2012 with SVT while off of medications. Beta blocker therapy was reinitiated.    Last seen by me 01/05/2013.  Recent Labs: 08/04/2012: Pro B Natriuretic peptide (BNP) 688.1*  01/06/2013: Direct LDL 206.8; HDL 42.20; TSH 6.96  04/12/2013: ALT 17; AST 16  04/15/2013: Creatinine 1.33; Hemoglobin 12.4*; Potassium 4.8    Wt Readings from Last 3 Encounters:  04/12/13 285 lb 4.4 oz (129.4 kg)  04/12/13 285 lb 4.4 oz (129.4 kg)  04/12/13 285 lb 4.4 oz (129.4 kg)     Past Medical History  Diagnosis Date  . Hyperlipidemia   . Ischemic cardiomyopathy     a. EF 40% 2010. b. Echo (2/14):  mild LVH, EF 30-35%, diff HK, Gr 1 DD, MAC, mild MR, mod LAE, PASP 39  . Cataract   . Hypotension   . Paroxysmal atrial fibrillation   . Peripheral vascular disease     a. s/p L BKA 2014.  [name] History of blood clots     in legs--this was in 2011--has been off of Coumadin 80months;takes Xarelto daily  . Peripheral neuropathy   . Joint pain   . H/O hiatal hernia   . Chronic kidney disease     on Lisinopril to protect kidneys  d/t being on Metformin per pt  . Myocardial infarction     total of 8 heart attacks;last one in 2011  . Atrial flutter   . H/O medication noncompliance     Due to insurance issues  . Chronic systolic CHF (congestive heart failure)   . Coronary artery disease     a. s/p remote CABG 2001 at [facility] b. Cath 03/2010: for med rx.;  c.  [name] myoview (1/14):  Large Inferior apical MI, very small area of peri-infarct ischemia toward the inferior apical segment. EF 19%.  Med Rx was continued.    . Diabetes mellitus 1979    takes Metformni daily  and Lantus 50units bid    No current facility-administered medications for this visit.   No current outpatient prescriptions on file.   Facility-Administered Medications Ordered in Other Visits  Medication Dose Route Frequency Provider Last Rate Last Dose  . acetaminophen (TYLENOL) tablet 650 mg  650 mg Oral Q6H PRN Nada Libman, MD   650 mg at 04/13/13 2112  . aztreonam (AZACTAM) 2 g in dextrose 5 % 50 mL IVPB  2 g Intravenous Q8H [name], MD   2 g at 04/15/13 0636  . collagenase (SANTYL) ointment   Topical Daily [name], MD      .  enoxaparin (LOVENOX) injection 40 mg  40 mg Subcutaneous Q24H [name], MD   40 mg at 04/14/13 0953  . ezetimibe (ZETIA) tablet 10 mg  10 mg Oral Daily [name], MD   10 mg at 04/14/13 2200  . guaiFENesin-dextromethorphan (ROBITUSSIN DM) 100-10 MG/5ML syrup 15 mL  15 mL Oral Q4H PRN [name], NP      . hydrALAZINE (APRESOLINE) injection 10 mg  10 mg Intravenous Q2H PRN [name], NP      . insulin aspart (novoLOG) injection 0-15 Units  0-15 Units Subcutaneous TID WC [name], PA-C   3 Units at 04/15/13 0636  . insulin aspart (novoLOG) injection 0-5 Units  0-5 Units Subcutaneous QHS [name], PA-C   3 Units at 04/13/13 2142  . insulin aspart (novoLOG) injection 3 Units  3 Units Subcutaneous TID WC [name], MD   3 Units at 04/14/13 [phone]  . insulin glargine (LANTUS)  injection 60 Units  60 Units Subcutaneous BID [name], MD   60 Units at 04/14/13 2307  . labetalol (NORMODYNE,TRANDATE) injection 10 mg  10 mg Intravenous Q2H PRN [name], NP      . lactated ringers infusion   Intravenous Continuous Arta Bruce, MD 50 mL/hr at 04/14/13 1420    . lisinopril (PRINIVIL,ZESTRIL) tablet 5 mg  5 mg Oral Daily [name], PA-C   5 mg at 04/14/13 9604  . metoprolol (LOPRESSOR) injection 2-5 mg  2-5 mg Intravenous Q2H PRN [name], NP   5 mg at 04/14/13 1957  . metoprolol succinate (TOPROL-XL) 24 hr tablet 50 mg  50 mg Oral BID [name], PA-C   50 mg at 04/14/13 1957  . morphine 2 MG/ML injection 2-5 mg  2-5 mg Intravenous Q4H PRN [name], NP   4 mg at 04/14/13 1956  . nitroGLYCERIN (NITROSTAT) SL tablet 0.4 mg  0.4 mg Sublingual Q5 min PRN [name], MD      . ondansetron [facility]) injection 4 mg  4 mg Intravenous Q6H PRN [name], NP      . oxyCODONE (Oxy IR/ROXICODONE) immediate release tablet 5-10 mg  5-10 mg Oral Q4H PRN [name], MD   10 mg at 04/15/13 0138  . pantoprazole (PROTONIX) EC tablet 40 mg  40 mg Oral Daily [name], NP   40 mg at 04/14/13 0942  . phenol (CHLORASEPTIC) mouth spray 1 spray  1 spray Mouth/Throat PRN [name], NP      . sodium chloride 0.9 % injection 3 mL  3 mL Intravenous Q12H [name], NP      . vancomycin (VANCOCIN) 1,250 mg in sodium chloride 0.9 % 250 mL IVPB  1,250 mg Intravenous Q12H [name], RPH   1,250 mg at 04/14/13 2311    Allergies:    Allergies  Allergen Reactions  . Statins     Muscle aches  . Amlodipine Besylate     Muscle aches making difficult to walk  . Cephalexin     REACTION: Hives    Social History:  The patient  reports that he has quit smoking. His smoking use included Cigarettes. He smoked 0.00 packs per day. He has never used smokeless tobacco. He reports that he does not drink alcohol or use illicit drugs.   ROS:   Please see the history of present illness.   Denies bleeding problems.  All other systems reviewed and negative.   PHYSICAL EXAM: VS:  There were no vitals taken for this visit. Well nourished, well developed, in no acute distress HEENT: normal Neck: no JVD at 90 Cardiac:  normal S1, S2; RRR; no murmur Lungs:  Decreased breath sounds bilaterally, no wheezing, rhonchi or rales Abd: soft, nontender, no hepatomegaly Ext: trace right LE edema Skin: warm and dry Neuro:  CNs 2-12 intact, no focal abnormalities noted  EKG:  NSR, HR 87, poor R-wave progression, lateral T wave inversions, no significant change when compared to prior tracings    ASSESSMENT AND PLAN:  1. Coronary Artery Disease:  No angina. He is not on aspirin as he is on Xarelto. He is intolerant to statins. 2. Hypertension:  Controlled. Check a followup basic metabolic panel today given recent initiation of ACE inhibitor. 3. Hyperlipidemia:  He is intolerant to statins. Continue WelChol. 4. Chronic Systolic CHF:  Volume stable. Continue current therapy. 5. Atrial Fibrillation:  Maintaining sinus rhythm. Continue Xarelto. Check a basic metabolic panel today to ensure stable renal function. 6. Disposition:  Followup in 3 months with me in 6 months with Dr. Antoine PocheHochrein.  Signed, Tereso NewcomerScott Timothy Townsel, PA-C  8:10 AM 04/15/2013

## 2013-04-15 NOTE — Consult Note (Signed)
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 Imogene Burn   HPI: Leroy [name] is a 68 y.o. 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 Patient did not receive inpatient rehabilitation services secondary to insurance issues/denial per [facility]. Admitted 04/12/2013 with right foot gangrenous changes as well as recent right iliofemoral endarterectomy with poor healing. Limb was not felt to be salvageable and underwent right below-knee dictation as well as superficial right groin dehiscence with debridement 04/14/2013 per Dr. Imogene Burn. Postoperative pain management as well as postoperative VAC. Hemoglobin A1c 12.4 with insulin therapy as directed. Physical and occupational therapy evaluations pending. M.D.has requested physical medicine rehabilitation consult to consider inpatient rehabilitation services  Pt without pain c/os  Review of Systems  Cardiovascular: Positive for palpitations and leg swelling.  Musculoskeletal: Positive for joint pain.  All other systems reviewed and are negative.   Past Medical History  Diagnosis Date  . Hyperlipidemia   . Ischemic cardiomyopathy     a. EF 40% 2010. b. Echo (2/14):  mild LVH, EF 30-35%, diff HK, Gr 1 DD, MAC, mild MR, mod LAE, PASP 39  . Cataract   . Hypotension   . Paroxysmal atrial fibrillation   . Peripheral vascular disease     a. s/p L BKA 2014.  [name] History of blood clots     in legs--this was in 2011--has been off of Coumadin 9months;takes Xarelto daily  . Peripheral neuropathy   . Joint pain   . H/O hiatal hernia   . Chronic kidney disease     on Lisinopril to protect kidneys d/t being on Metformin per pt  . Myocardial infarction      total of 8 heart attacks;last one in 2011  . Atrial flutter   . H/O medication noncompliance     Due to insurance issues  . Chronic systolic CHF (congestive heart failure)   . Coronary artery disease     a. s/p remote CABG 2001 at [facility] b. Cath 03/2010: for med rx.;  c.  [name] myoview (1/14):  Large Inferior apical MI, very small area of peri-infarct ischemia toward the inferior apical segment. EF 19%.  Med Rx was continued.    . Diabetes mellitus 1979    takes Metformni daily  and Lantus 50units bid   Past Surgical History  Procedure Laterality Date  . Revasculariztion  2011/2010    x 2   . Coronary artery bypass graft  03/2000    quadruple  . Tonsillectomy    . Hernia repair    . Cardiac catheterization  03/19/10  . Abdominal aortagram  04-30-12  . Adenoidectomy    . Amputation Left 06/03/2012    Procedure: AMPUTATION BELOW KNEE;  Surgeon: [name], MD;  Location: [facility] OR;  Service: Vascular;  Laterality: Left;  . Coronary angioplasty  2002    3 stents  . Endarterectomy femoral Right 03/25/2013    Procedure: ENDARTERECTOMY FEMORAL ;  Surgeon: [name], MD;  Location: [facility] OR;  Service: Vascular;  Laterality: Right;  . Patch angioplasty Right 03/25/2013    Procedure: PATCH ANGIOPLASTY USING VASCU-GUARD PERIPHERAL VASCULAR PATCH;  Surgeon: [name], MD;  Location: [facility] OR;  Service: Vascular;  Laterality: Right;   Family History  Problem Relation Age of Onset  . Heart disease Mother     before age 69  . Diabetes Mother   . Heart attack Mother   . Heart disease Father    Social History:  reports that he has quit smoking. His smoking use included Cigarettes. He smoked 0.00 packs per day. He has never used smokeless tobacco. He reports that he does not drink alcohol or use illicit drugs. Allergies:  Allergies  Allergen Reactions  . Statins     Muscle aches  . Amlodipine Besylate     Muscle aches making difficult to walk  . Cephalexin     REACTION: Hives    Medications Prior to Admission  Medication Sig Dispense Refill  . ezetimibe (ZETIA) 10 MG tablet Take 10 mg by mouth daily.      . insulin glargine (LANTUS) 100 UNIT/ML injection Inject 0.5 mLs (50 Units total) into the skin 2 (two) times daily. Please dispense a 90 day supply with refills to complete the year  10 pen  11  . insulin lispro (HUMALOG) 100 UNIT/ML injection Inject 5 Units into the skin 3 (three) times daily as needed for high blood sugar.      . lisinopril (PRINIVIL,ZESTRIL) 5 MG tablet Take 1 tablet (5 mg total) by mouth daily.  30 tablet  11  . metoprolol succinate (TOPROL-XL) 50 MG 24 hr tablet Take 1 tablet (50 mg total) by mouth 2 (two) times daily.  60 tablet  11  . nitroGLYCERIN (NITROSTAT) 0.4 MG SL tablet Place 0.4 mg under the tongue every 5 (five) minutes as needed for chest pain.      . Saxagliptin-Metformin (KOMBIGLYZE XR) 2.08-998 MG TB24 Take 1 tablet by mouth daily.      [name] oxyCODONE-acetaminophen (PERCOCET/ROXICET) 5-325 MG per tablet Take 1 tablet by mouth every 6 (six) hours as needed for severe pain.  30 tablet  0    Home: Home Living Family/patient expects to be discharged to:: Private residence Living Arrangements: Children  Functional History:   Functional Status:  Mobility:          ADL:    Cognition: Cognition Orientation Level: Oriented X4    Blood pressure 121/61, pulse 93, temperature 99.9 F (37.7 C), temperature source Oral, resp. rate 20, height 5\' 9"  (1.753 m), weight 129.4 kg (285 lb 4.4 oz), SpO2 97.00%. Physical Exam  Vitals reviewed. Constitutional: He is oriented to person, place, and time.  HENT:  Head: Normocephalic.  Eyes: EOM are normal.  Neck: Normal range of motion. Neck supple. No thyromegaly present.  Cardiovascular: Normal rate and regular rhythm.   Respiratory: Effort normal and breath sounds normal. No respiratory distress.  GI: Soft. Bowel sounds are normal. He exhibits no distension.  Neurological: He is  alert and oriented to person, place, and time.  Skin:  Right BKA site staples intact few drops of bright red blood appropriately tender. Left BKA site well healed   motor strength is 5/5 bilateral deltoid, bicep, tricep, grip 5/5 left hip flexor knee extensor Right lower extremity not tested has pain in edition difficulty to move  Results for orders placed during the hospital encounter of 04/12/13 (from the past 24 hour(s))  GLUCOSE, CAPILLARY     Status: Abnormal   Collection Time    04/14/13  3:16 PM      Result Value Range   Glucose-Capillary 182 (*) 70 - 99 mg/dL  GLUCOSE, CAPILLARY     Status: Abnormal   Collection Time  04/14/13  5:01 PM      Result Value Range   Glucose-Capillary 151 (*) 70 - 99 mg/dL   Comment 1 Documented in Chart     Comment 2 Notify RN    GLUCOSE, CAPILLARY     Status: Abnormal   Collection Time    04/14/13  8:05 PM      Result Value Range   Glucose-Capillary 173 (*) 70 - 99 mg/dL  CBC WITH DIFFERENTIAL     Status: Abnormal   Collection Time    04/15/13  5:10 AM      Result Value Range   WBC 12.4 (*) 4.0 - 10.5 K/uL   RBC 4.11 (*) 4.22 - 5.81 MIL/uL   Hemoglobin 12.4 (*) 13.0 - 17.0 g/dL   HCT 40.9 (*) 81.1 - 91.4 %   MCV 90.0  78.0 - 100.0 fL   MCH 30.2  26.0 - 34.0 pg   MCHC 33.5  30.0 - 36.0 g/dL   RDW 78.2  95.6 - 21.3 %   Platelets 277  150 - 400 K/uL   Neutrophils Relative % 71  43 - 77 %   Neutro Abs 8.9 (*) 1.7 - 7.7 K/uL   Lymphocytes Relative 16  12 - 46 %   Lymphs Abs 1.9  0.7 - 4.0 K/uL   Monocytes Relative 12  3 - 12 %   Monocytes Absolute 1.5 (*) 0.1 - 1.0 K/uL   Eosinophils Relative 1  0 - 5 %   Eosinophils Absolute 0.1  0.0 - 0.7 K/uL   Basophils Relative 0  0 - 1 %   Basophils Absolute 0.0  0.0 - 0.1 K/uL  BASIC METABOLIC PANEL     Status: Abnormal   Collection Time    04/15/13  5:10 AM      Result Value Range   Sodium 133 (*) 137 - 147 mEq/L   Potassium 4.8  3.7 - 5.3 mEq/L   Chloride 96  96 - 112 mEq/L   CO2 26   19 - 32 mEq/L   Glucose, Bld 209 (*) 70 - 99 mg/dL   BUN 20  6 - 23 mg/dL   Creatinine, Ser 0.86  0.50 - 1.35 mg/dL   Calcium 8.7  8.4 - 57.8 mg/dL   GFR calc non Af Amer 54 (*) >90 mL/min   GFR calc Af Amer 63 (*) >90 mL/min  GLUCOSE, CAPILLARY     Status: Abnormal   Collection Time    04/15/13  6:03 AM      Result Value Range   Glucose-Capillary 194 (*) 70 - 99 mg/dL  GLUCOSE, CAPILLARY     Status: Abnormal   Collection Time    04/15/13 11:06 AM      Result Value Range   Glucose-Capillary 171 (*) 70 - 99 mg/dL   Comment 1 Notify RN     No results found.  Assessment/Plan: Diagnosis: Right below knee amputation secondary to peripheral vascular disease postop day #2 1. Does the need for close, 24 hr/day medical supervision in concert with the patient's rehab needs make it unreasonable for this patient to be served in a less intensive setting? Potentially 2. Co-Morbidities requiring supervision/potential complications: Left BKA  6 months ago 3. Due to bowel management, safety, skin/wound care, disease management, medication administration, pain management and patient education, does the patient require 24 hr/day rehab nursing? Potentially 4. Does the patient require coordinated care of a physician, rehab nurse, PT (1-2 hrs/day, 5 days/week) and OT (  1-2 hrs/day, 5 days/week) to address physical and functional deficits in the context of the above medical diagnosis(es)? Potentially Addressing deficits in the following areas: balance, endurance, locomotion, strength, transferring, bowel/bladder control, bathing, dressing and toileting 5. Can the patient actively participate in an intensive therapy program of at least 3 hrs of therapy per day at least 5 days per week? Potentially 6. The potential for patient to make measurable gains while on inpatient rehab is fair 7. Anticipated functional outcomes upon discharge from inpatient rehab are supervision with PT, supervision to modified with OT,  NA with SLP. 8. Estimated rehab length of stay to reach the above functional goals is: 7-10 days 9. Does the patient have adequate social supports to accommodate these discharge functional goals? Potentially 10. Anticipated D/C setting: Home 11. Anticipated post D/C treatments: HH therapy 12. Overall Rehab/Functional Prognosis: good  RECOMMENDATIONS: This patient's condition is appropriate for continued rehabilitative care in the following setting: CIR if the patient starts participating with PT and OT Patient has agreed to participate in recommended program. No Note that insurance prior authorization may be required for reimbursement for recommended care.  Comment: Patient has been refusing PT OT, poor awareness of deficits.    04/15/2013

## 2013-04-15 NOTE — Progress Notes (Addendum)
[facility] [place]  Subjective  - No new complaints.  He does have pain in the right stump  Objective 121/61 93 99.9 F (37.7 C) (Oral) 20 97%  Intake/Output Summary (Last 24 hours) at 04/15/13 0801 Last data filed at 04/15/13 0138  Gross per 24 hour  Intake   1040 ml  Output    750 ml  Net    290 ml    Right stump dressing clean and dry. Right groin wound vac adherent without leak.  Assessment/Planning: POD # Right BKA and groin wound I&D. Plan to change Stump dressing tomorrow. Wound vac Plan per Dr. Imogene Burnhen on length of time. Medicine working on DM control  Clinton GallantCOLLINS, EMMA [facility] 04/15/2013 8:01 AM --  Laboratory Lab Results:  Recent Labs  04/14/13 0542 04/15/13 0510  WBC 13.0* 12.4*  HGB 11.7* 12.4*  HCT 34.4* 37.0*  PLT 247 277   BMET  Recent Labs  04/14/13 0542 04/15/13 0510  NA 134* 133*  K 4.2 4.8  CL 97 96  CO2 24 26  GLUCOSE 303* 209*  BUN 18 20  CREATININE 1.18 1.33  CALCIUM 8.5 8.7    COAG Lab Results  Component Value Date   INR 1.17 04/12/2013   INR 0.97 03/23/2013   INR 2.01* 08/04/2012   No results found for this basename: PTT   Addendum  I have independently interviewed and examined the patient, and I agree with the physician assistant's findings.    1.  R foot gangrene: R BKA bandage off tomorrow, cont IV abx due to concerns for bactermia, cont abx until Blood culture back 2.  Uncontrolled DM with complications: mgmt per IM 3.  R BKA: PT/OT/CIR c/s 4.  PAF: unclear why patient stopped Xarelto, will need to discuss further   Leonides SakeBrian Jarom Govan, MD [facility] [place] Office: [phone] Pager: [phone]  04/15/2013, 9:31 AM

## 2013-04-15 NOTE — Progress Notes (Signed)
Physical medicine and rehabilitation consult requested after right below-knee amputation Will await formal physical occupational therapy evaluation and follow up with formal rehabilitation consult.

## 2013-04-15 NOTE — Progress Notes (Signed)
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 Statins     Muscle aches  . Amlodipine Besylate     Muscle aches making difficult to walk  . Cephalexin     REACTION: Hives    Patient Measurements: Height: 5\' 9"  (175.3 cm) Weight: 285 lb 4.4 oz (129.4 kg) IBW/kg (Calculated) : 70.7   Vital Signs: Temp: 99.9 F (37.7 C) (01/08 0522) Temp src: Oral (01/08 0522) BP: 121/61 mmHg (01/08 0522) Pulse Rate: 93 (01/08 0522) Intake/Output from previous day: 01/07 0701 - 01/08 0700 In: 1280 [P.O.:480; I.V.:800] Out: 1200 [Urine:1150; Blood:50] Intake/Output from this shift:    Labs:  Recent Labs  04/12/13 2155 04/14/13 0542 04/15/13 0510  WBC 15.4* 13.0* 12.4*  HGB 14.0 11.7* 12.4*  PLT 271 247 277  CREATININE 1.06 1.18 1.33   Estimated Creatinine Clearance: 72.8 ml/min (by C-G formula based on Cr of 1.33). No results found for this basename: VANCOTROUGH, VANCOPEAK, VANCORANDOM, GENTTROUGH, GENTPEAK, GENTRANDOM, TOBRATROUGH, TOBRAPEAK, TOBRARND, AMIKACINPEAK, AMIKACINTROU, AMIKACIN,  in the last 72 hours    Assessment: 73 YOM with gangrene of right great toe s/p RBKA on 04/14/13 on vancomycin and aztreonam. CrCl ~ 72 mL/min. WBC trending down to 12.4. Tmax 99.15F  1/5 Aztreonam>>  1/6 Vanc >>   1/6 Bld x2>>   Goal of Therapy:  Vancomycin trough level 15-20 mcg/ml  Plan:  - Vancomycin 1250 mg IV Q 12 hrs - Aztreonam 2gm IV Q 8 hours  - f/u CBC, renal fx and patient's clinical status - Vanc trough scheduled for tomorrow   Vinnie Level, PharmD.  Clinical Pharmacist Pager [phone]

## 2013-04-16 ENCOUNTER — Inpatient Hospital Stay (HOSPITAL_COMMUNITY): Payer: Medicare HMO

## 2013-04-16 ENCOUNTER — Encounter: Payer: Medicare HMO | Admitting: Vascular Surgery

## 2013-04-16 DIAGNOSIS — S88119A Complete traumatic amputation at level between knee and ankle, unspecified lower leg, initial encounter: Secondary | ICD-10-CM

## 2013-04-16 DIAGNOSIS — L98499 Non-pressure chronic ulcer of skin of other sites with unspecified severity: Secondary | ICD-10-CM

## 2013-04-16 DIAGNOSIS — I739 Peripheral vascular disease, unspecified: Secondary | ICD-10-CM

## 2013-04-16 LAB — GLUCOSE, CAPILLARY
GLUCOSE-CAPILLARY: 154 mg/dL — AB (ref 70–99)
GLUCOSE-CAPILLARY: 227 mg/dL — AB (ref 70–99)
Glucose-Capillary: 147 mg/dL — ABNORMAL HIGH (ref 70–99)
Glucose-Capillary: 100 mg/dL — ABNORMAL HIGH (ref 70–99)

## 2013-04-16 LAB — URINE MICROSCOPIC-ADD ON

## 2013-04-16 LAB — URINALYSIS, ROUTINE W REFLEX MICROSCOPIC
BILIRUBIN URINE: NEGATIVE
Glucose, UA: 100 mg/dL — AB
KETONES UR: NEGATIVE mg/dL
Leukocytes, UA: NEGATIVE
NITRITE: NEGATIVE
Protein, ur: 100 mg/dL — AB
SPECIFIC GRAVITY, URINE: 1.025 (ref 1.005–1.030)
UROBILINOGEN UA: 1 mg/dL (ref 0.0–1.0)
pH: 5 (ref 5.0–8.0)

## 2013-04-16 MED ORDER — SODIUM CHLORIDE 0.9 % IJ SOLN
10.0000 mL | INTRAMUSCULAR | Status: DC | PRN
  Administered 2013-04-16: 40 mL
  Administered 2013-04-16 – 2013-04-19 (×3): 10 mL

## 2013-04-16 MED ORDER — ALTEPLASE 2 MG IJ SOLR
2.0000 mg | Freq: Once | INTRAMUSCULAR | Status: AC
  Administered 2013-04-16: 2 mg
  Filled 2013-04-16: qty 2

## 2013-04-16 MED ORDER — ENOXAPARIN SODIUM 60 MG/0.6ML ~~LOC~~ SOLN
60.0000 mg | Freq: Every day | SUBCUTANEOUS | Status: DC
  Administered 2013-04-17 – 2013-04-18 (×2): 60 mg via SUBCUTANEOUS
  Filled 2013-04-16 (×3): qty 0.6

## 2013-04-16 NOTE — Progress Notes (Signed)
Patient Temperature of 102.1. MD made aware. Tylenol given per order. Will continue to monitor.   Valinda Hoar RN

## 2013-04-16 NOTE — Progress Notes (Signed)
   Daily Progress Note  Assessment/Planning: POD #2 s/p R AKA, R groin debridement and VAC placement   R AKA stump viable, ABD and stump sock to R AKA  Cont VAC to right groin  Check urine and cxr also  DM per IM  Subjective  - 2 Days Post-Op  Pain controlled  Objective Filed Vitals:   04/15/13 0522 04/15/13 2138 04/15/13 2310 04/16/13 0532  BP: 121/61 126/62  143/68  Pulse: 93 97  94  Temp: 99.9 F (37.7 C) 102.1 F (38.9 C) 98.2 F (36.8 C) 100.6 F (38.1 C)  TempSrc: Oral Oral Oral Oral  Resp: 20 18  20   Height:      Weight:      SpO2: 97% 97%  99%    Intake/Output Summary (Last 24 hours) at 04/16/13 0759 Last data filed at 04/15/13 1700  Gross per 24 hour  Intake    480 ml  Output   1100 ml  Net   -620 ml    PULM  CTAB CV  RRR GI  soft, NTND VASC  R AKA viable with intact staple line, R groin clean with some granulation evident  Laboratory CBC    Component Value Date/Time   WBC 12.4* 04/15/2013 0510   HGB 12.4* 04/15/2013 0510   HCT 37.0* 04/15/2013 0510   PLT 277 04/15/2013 0510    BMET    Component Value Date/Time   NA 133* 04/15/2013 0510   K 4.8 04/15/2013 0510   CL 96 04/15/2013 0510   CO2 26 04/15/2013 0510   GLUCOSE 209* 04/15/2013 0510   BUN 20 04/15/2013 0510   CREATININE 1.33 04/15/2013 0510   CALCIUM 8.7 04/15/2013 0510   GFRNONAA 54* 04/15/2013 0510   GFRAA 63* 04/15/2013 0510    [name], MD [facility] [place] Office: [phone] Pager: [phone]  04/16/2013, 7:59 AM

## 2013-04-16 NOTE — Consult Note (Signed)
WOC wound follow up Wound type: VVS team in earlier to assess right groin wound during first post-op dressing change.  Requested to re-apply vac dressing. Measurement:5X4X3cm, no undermining. Wound bed:85% red, 15% yellow Drainage (amount, consistency, odor) No odor, small amt yellow drainage in cannister.  Periwound: Intact skin surrounding. Dressing procedure/placement/frequency: Applied Mepitel contact layer to protect wound bed.  Barrier ring applied around wound to maintain seal in skin creases.  One piece black foam applied to 125mm cont suction.  Pt tolerated without pain.  Plan for bedside nurse to change Q M/W/F.Supplies at bedside. Please re-consult if further assistance is needed.  Thank-you,  Cammie Mcgeeawn Merril Nagy MSN, RN, CWOCN, [facility], CNS [phone]

## 2013-04-16 NOTE — Progress Notes (Signed)
I will follow up with pt on Monday to assess his rehab potential. [phone]

## 2013-04-16 NOTE — Progress Notes (Signed)
PT Cancellation Note  Patient Details Name: Leroy [name] MRN: [id number] DOB: 1946/01/01   Cancelled Treatment:    Reason Eval/Treat Not Completed: Patient declined, no reason specified. 04/16/2013  [place] Bing, PT [phone]  (pager)   Kymber Kosar, Eliseo Gum 04/16/2013, 11:48 AM

## 2013-04-16 NOTE — Consult Note (Signed)
[name]  Leroy GriffithsWilliam J [name] ION:[id number] DOB: 12-Dec-1945 DOA: 04/12/2013 PCP: [name], MD  Assessment/Plan: 1. 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 DM uncontrolled:  HbA1C 12.4.  Patient admits to non compliance.  Lantus increased to 62 units  yesterday, also with SSI.  CBG's improving. 3. Fever: agree with UA and CXR. Would also check blood cultures. 4. CAD s/p CABG: continue home regimen. Cardiology has seen and he will follow up as outpatient.  5. PAF: on xarelto.  Patient not taking due to price.  Will need to resume. Cont BB. Had episode of afib RVR overnight now converted back to NSR. 6. CHF chronic stable: cont ACE 7. HTN: fair control. No changes.  8. Prophylaxis: per VVS  Procedures:  R BKA 04/14/13  HPI/Subjective: Groggy, no acute compaints  Objective: Filed Vitals:   04/16/13 0532  BP: 143/68  Pulse: 94  Temp: 100.6 F (38.1 C)  Resp: 20    Intake/Output Summary (Last 24 hours) at 04/16/13 1422 Last data filed at 04/16/13 0800  Gross per 24 hour  Intake    480 ml  Output    300 ml  Net    180 ml   Filed Weights   04/12/13 1919  Weight: 129.4 kg (285 lb 4.4 oz)    Exam:   General:  NAD, groggy  Cardiovascular: RRR no mrg  Respiratory: CTAB  Musculoskeletal: right BKA dressed with wound vac   Data Reviewed: Basic Metabolic Panel:  Recent Labs Lab 04/12/13 2155 04/14/13 0542 04/15/13 0510  NA 135* 134* 133*  K 4.3 4.2 4.8  CL 97 97 96  CO2 24 24 26   GLUCOSE 207* 303* 209*  BUN 14 18 20   CREATININE 1.06 1.18 1.33  CALCIUM 9.1 8.5 8.7   Liver Function Tests:  Recent Labs Lab 04/12/13 2155  AST 16  ALT 17  ALKPHOS 119*  BILITOT 0.3  PROT 7.9  ALBUMIN 2.7*   No results found for this basename: LIPASE, AMYLASE,  in the last 168 hours No results found for this basename: AMMONIA,  in the last 168 hours CBC:  Recent Labs Lab 04/12/13 2155 04/14/13 0542 04/15/13 0510  WBC 15.4* 13.0* 12.4*   NEUTROABS  --   --  8.9*  HGB 14.0 11.7* 12.4*  HCT 39.4 34.4* 37.0*  MCV 87.8 88.2 90.0  PLT 271 247 277   Cardiac Enzymes: No results found for this basename: CKTOTAL, CKMB, CKMBINDEX, TROPONINI,  in the last 168 hours BNP (last 3 results)  Recent Labs  08/04/12 1232  PROBNP 688.1*   CBG:  Recent Labs Lab 04/15/13 1106 04/15/13 1731 04/15/13 2134 04/16/13 0555 04/16/13 1121  GLUCAP 171* 105* 103* 154* 147*    Recent Results (from the past 240 hour(s))  CULTURE, BLOOD (ROUTINE X 2)     Status: None   Collection Time    04/13/13  9:35 PM      Result Value Range Status   Specimen Description BLOOD LEFT ARM   Final   Special Requests BOTTLES DRAWN AEROBIC ONLY 10CC   Final   Culture  Setup Time     Final   Value: 04/14/2013 01:48     Performed at [facility]   Culture     Final   Value:        BLOOD CULTURE RECEIVED NO GROWTH TO DATE CULTURE WILL BE HELD FOR 5 DAYS BEFORE ISSUING A FINAL NEGATIVE REPORT     Performed at  [facility]   Report Status PENDING   Incomplete  CULTURE, BLOOD (ROUTINE X 2)     Status: None   Collection Time    04/13/13  9:40 PM      Result Value Range Status   Specimen Description BLOOD LEFT HAND   Final   Special Requests BOTTLES DRAWN AEROBIC ONLY 4CC   Final   Culture  Setup Time     Final   Value: 04/14/2013 01:50     Performed at [facility]   Culture     Final   Value:        BLOOD CULTURE RECEIVED NO GROWTH TO DATE CULTURE WILL BE HELD FOR 5 DAYS BEFORE ISSUING A FINAL NEGATIVE REPORT     Performed at [facility]   Report Status PENDING   Incomplete  MRSA PCR SCREENING     Status: None   Collection Time    04/14/13  1:39 PM      Result Value Range Status   MRSA by PCR NEGATIVE  NEGATIVE Final   Comment:            The GeneXpert MRSA Assay (FDA     approved for NASAL specimens     only), is one component of a     comprehensive MRSA colonization     surveillance program. It is not      intended to diagnose MRSA     infection nor to guide or     monitor treatment for     MRSA infections.     Studies: Dg Chest Port 1 View  04/16/2013   CLINICAL DATA:  68 year old male with shortness of Breath status post lower extremity amputation. High-grade fever. Initial encounter.  EXAM: PORTABLE CHEST - 1 VIEW  COMPARISON:  08/04/2012 and earlier.  FINDINGS: Portable AP upright view at 0809 hr. Right side PICC line in place, tip at the level of the lower SVC slightly below the carina. Combined patchy in curvilinear opacity at the right lung base, most resembles atelectasis. No pneumothorax, pulmonary edema, pleural effusion or definite consolidation. Normal cardiac size and mediastinal contours. Sequelae of median sternotomy.  IMPRESSION: 1. Opacity at the medial right lung base which most resembles atelectasis. 2. Right PICC line in place.   Electronically Signed   By: Augusto Gamble M.D.   On: 04/16/2013 08:26    Scheduled Meds: . aztreonam  2 g Intravenous Q8H  . enoxaparin (LOVENOX) injection  40 mg Subcutaneous Q24H  . ezetimibe  10 mg Oral Daily  . insulin aspart  0-15 Units Subcutaneous TID WC  . insulin aspart  0-5 Units Subcutaneous QHS  . insulin aspart  3 Units Subcutaneous TID WC  . insulin glargine  62 Units Subcutaneous BID  . lisinopril  5 mg Oral Daily  . metoprolol succinate  50 mg Oral BID  . pantoprazole  40 mg Oral Daily  . sodium chloride  3 mL Intravenous Q12H  . vancomycin  1,250 mg Intravenous Q12H   Continuous Infusions: . lactated ringers 50 mL/hr at 04/14/13 1420    Active Problems:   CAD   Type II or unspecified type diabetes mellitus with neurological manifestations, uncontrolled(250.62)   Critical lower limb ischemia   Gangrene of foot    Time spent: 20 minutes    [facility]  [facility] Pager [phone]. If 7PM-7AM, please contact night-coverage at [email or web address], password [facility] 04/16/2013, 2:22 PM  LOS: 4 days

## 2013-04-16 NOTE — Progress Notes (Signed)
OT Cancellation Note  Patient Details Name: KAZUMI BRUMBLEY MRN: [id number] DOB: [birth year]   Cancelled Treatment:    Reason Eval/Treat Not Completed: Patient declined, no reason specified - pt adamantly refusing despite encouragement.  Jeani Hawking, OTR/L [phone]  04/16/2013, 11:53 AM

## 2013-04-16 NOTE — Progress Notes (Signed)
Orthopedic Tech Progress Note Patient Details:  FISCHER BOASE [birth year] [id number] [employer] called for retention sock order per nursing request  Patient ID: VIRLYN TIMMER, male   DOB: 02-22-1946, 68 y.o.   MRN: [id number]   Orie Rout 04/16/2013, 9:21 AM

## 2013-04-17 LAB — GLUCOSE, CAPILLARY
GLUCOSE-CAPILLARY: 67 mg/dL — AB (ref 70–99)
GLUCOSE-CAPILLARY: 68 mg/dL — AB (ref 70–99)
GLUCOSE-CAPILLARY: 182 mg/dL — AB (ref 70–99)
Glucose-Capillary: 105 mg/dL — ABNORMAL HIGH (ref 70–99)
Glucose-Capillary: 187 mg/dL — ABNORMAL HIGH (ref 70–99)
Glucose-Capillary: 118 mg/dL — ABNORMAL HIGH (ref 70–99)

## 2013-04-17 LAB — CBC
HCT: 32.2 % — ABNORMAL LOW (ref 39.0–52.0)
Hemoglobin: 11 g/dL — ABNORMAL LOW (ref 13.0–17.0)
MCH: 30.2 pg (ref 26.0–34.0)
MCHC: 34.2 g/dL (ref 30.0–36.0)
MCV: 88.5 fL (ref 78.0–100.0)
Platelets: 253 10*3/uL (ref 150–400)
RBC: 3.64 MIL/uL — ABNORMAL LOW (ref 4.22–5.81)
RDW: 12.6 % (ref 11.5–15.5)
WBC: 14.6 10*3/uL — ABNORMAL HIGH (ref 4.0–10.5)

## 2013-04-17 NOTE — Consult Note (Signed)
[name]  Leroy [name]:[id number] DOB: Nov 04, 1945 DOA: 04/12/2013 PCP: [name], MD  Assessment/Plan: 1. 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 DM uncontrolled:  HbA1C 12.4.  Patient admits to non compliance.  Lantus increased to 62 units BID 04/15/13, also with SSI.  CBG's improved, actually low at 60 this am, meal coverage held.  Continue to monitor without change for now. 3. Fever: afebrile today. CXR atx vs pna. On broad spectrum abx. rec incentiv spirometer 4. CAD s/p CABG: continue home regimen. Cardiology has seen and he will follow up as outpatient.  5. PAF: on xarelto.  Patient not taking due to price.  Will need to resume. Cont BB. Had episode of afib RVR overnight now converted back to NSR. 6. CHF chronic stable: cont ACE 7. HTN: fair control. No changes.  8. Prophylaxis: per VVS  Procedures:  R BKA 04/14/13  HPI/Subjective: Groggy, no acute compaints  Objective: Filed Vitals:   04/17/13 0409  BP: 131/54  Pulse: 81  Temp: 99.4 F (37.4 C)  Resp: 21    Intake/Output Summary (Last 24 hours) at 04/17/13 0825 Last data filed at 04/17/13 0600  Gross per 24 hour  Intake    480 ml  Output    300 ml  Net    180 ml   Filed Weights   04/12/13 1919  Weight: 129.4 kg (285 lb 4.4 oz)    Exam:   General:  NAD, groggy  Cardiovascular: RRR no mrg  Respiratory: scattered rhonchi and wheezes, no distress, good air movement  Musculoskeletal: right BKA dressed with wound vac   Data Reviewed: Basic Metabolic Panel:  Recent Labs Lab 04/12/13 2155 04/14/13 0542 04/15/13 0510  NA 135* 134* 133*  K 4.3 4.2 4.8  CL 97 97 96  CO2 24 24 26   GLUCOSE 207* 303* 209*  BUN 14 18 20   CREATININE 1.06 1.18 1.33  CALCIUM 9.1 8.5 8.7   Liver Function Tests:  Recent Labs Lab 04/12/13 2155  AST 16  ALT 17  ALKPHOS 119*  BILITOT 0.3  PROT 7.9  ALBUMIN 2.7*   No results found for this basename: LIPASE, AMYLASE,  in the last 168  hours No results found for this basename: AMMONIA,  in the last 168 hours CBC:  Recent Labs Lab 04/12/13 2155 04/14/13 0542 04/15/13 0510 04/17/13 0500  WBC 15.4* 13.0* 12.4* 14.6*  NEUTROABS  --   --  8.9*  --   HGB 14.0 11.7* 12.4* 11.0*  HCT 39.4 34.4* 37.0* 32.2*  MCV 87.8 88.2 90.0 88.5  PLT 271 247 277 253   Cardiac Enzymes: No results found for this basename: CKTOTAL, CKMB, CKMBINDEX, TROPONINI,  in the last 168 hours BNP (last 3 results)  Recent Labs  08/04/12 1232  PROBNP 688.1*   CBG:  Recent Labs Lab 04/16/13 1643 04/16/13 2145 04/17/13 0615 04/17/13 0627 04/17/13 0657  GLUCAP 227* 100* 68* 67* 105*    Recent Results (from the past 240 hour(s))  CULTURE, BLOOD (ROUTINE X 2)     Status: None   Collection Time    04/13/13  9:35 PM      Result Value Range Status   Specimen Description BLOOD LEFT ARM   Final   Special Requests BOTTLES DRAWN AEROBIC ONLY 10CC   Final   Culture  Setup Time     Final   Value: 04/14/2013 01:48     Performed at [facility]  Final   Value:        BLOOD CULTURE RECEIVED NO GROWTH TO DATE CULTURE WILL BE HELD FOR 5 DAYS BEFORE ISSUING A FINAL NEGATIVE REPORT     [facility]formed [facility]   Report Status PENDING   Incomplete  CULTURE, BLOOD (ROUTINE X 2)     Status: None   Collection Time    04/13/13  9:40 PM      Result Value Range Status   Specimen Description BLOOD LEFT HAND   Final   Special Requests BOTTLES DRAWN AEROBIC ONLY 4CC   Final   Culture  Setup Time     Final   Value: 04/14/2013 01:50     Performed at [facility]   Culture     Final   Value:        BLOOD CULTURE RECEIVED NO GROWTH TO DATE CULTURE WILL BE HELD FOR 5 DAYS BEFORE ISSUING A FINAL NEGATIVE REPORT     Performed at [facility]   Report Status PENDING   Incomplete  MRSA PCR SCREENING     Status: None   Collection Time    04/14/13  1:39 PM      Result Value Range Status   MRSA by PCR NEGATIVE   NEGATIVE Final   Comment:            The GeneXpert MRSA Assay (FDA     approved for NASAL specimens     only), is one component of a     comprehensive MRSA colonization     surveillance program. It is not     intended to diagnose MRSA     infection nor to guide or     monitor treatment for     MRSA infections.     Studies: Dg Chest Port 1 View  04/16/2013   CLINICAL DATA:  68 year old male with shortness of Breath status post lower extremity amputation. High-grade fever. Initial encounter.  EXAM: PORTABLE CHEST - 1 VIEW  COMPARISON:  08/04/2012 and earlier.  FINDINGS: Portable AP upright view at 0809 hr. Right side PICC line in place, tip at the level of the lower SVC slightly below the carina. Combined patchy in curvilinear opacity at the right lung base, most resembles atelectasis. No pneumothorax, pulmonary edema, pleural effusion or definite consolidation. Normal cardiac size and mediastinal contours. Sequelae of median sternotomy.  IMPRESSION: 1. Opacity at the medial right lung base which most resembles atelectasis. 2. Right PICC line in place.   Electronically Signed   By: Augusto GambleLee  Hall M.D.   On: 04/16/2013 08:26    Scheduled Meds: . aztreonam  2 g Intravenous Q8H  . enoxaparin (LOVENOX) injection  60 mg Subcutaneous Daily  . ezetimibe  10 mg Oral Daily  . insulin aspart  0-15 Units Subcutaneous TID WC  . insulin aspart  0-5 Units Subcutaneous QHS  . insulin aspart  3 Units Subcutaneous TID WC  . insulin glargine  62 Units Subcutaneous BID  . lisinopril  5 mg Oral Daily  . metoprolol succinate  50 mg Oral BID  . pantoprazole  40 mg Oral Daily  . sodium chloride  3 mL Intravenous Q12H  . vancomycin  1,250 mg Intravenous Q12H   Continuous Infusions: . lactated ringers 50 mL/hr at 04/14/13 1420    Active Problems:   CAD   Type II or unspecified type diabetes mellitus with neurological manifestations, uncontrolled(250.62)   Critical lower limb ischemia   Gangrene of  foot  Time spent: 20 minutes    [facility]  [facility] Pager [phone]. If 7PM-7AM, please contact night-coverage at [email or web address], password [facility] 04/17/2013, 8:25 AM  LOS: 5 days

## 2013-04-17 NOTE — Progress Notes (Signed)
Hypoglycemic Event  CBG: 68  Treatment: oral fluids  Symptoms: None  Follow-up CBG: Time: 0655 CBG Result:105  Possible Reasons for Event: N/A      Orson Ape D  Remember to initiate Hypoglycemia Order Set & complete

## 2013-04-17 NOTE — Evaluation (Addendum)
Physical Therapy Evaluation Patient Details Name: Leroy [name] MRN: [id number] DOB: May 23, 1945 Today's Date: 04/17/2013 Time: 4098-1191 PT Time Calculation (min): 29 min  PT Assessment / Plan / Recommendation History of Present Illness  Pt s/p rt BKA. Pt with h/o lt BKA, DM, PVD, neuropathy, CAD.  Clinical Impression  Patient is s/p above surgery resulting in functional limitations due to the deficits listed below (see PT Problem List).  Patient will benefit from skilled PT to increase their independence and safety with mobility to allow discharge to CIR prior to return home from family.  Pt motivated to improve mobility and independence. Asked pt to have family bring stump shrinker for lt leg to prevent edema which would prevent pt from being able to don prosthesis.     PT Assessment  Patient needs continued PT services    Follow Up Recommendations  CIR    Does the patient have the potential to tolerate intense rehabilitation    Yes.  Barriers to Discharge        Equipment Recommendations  None recommended by PT    Recommendations for Other Services     Frequency Min 4X/week    Precautions / Restrictions Precautions Precautions: Fall Precaution Comments: reports falling at home 3-4 times in 6 months   Pertinent Vitals/Pain See flow sheet.      Mobility  Bed Mobility Overal bed mobility: Needs Assistance;+2 for physical assistance Bed Mobility: Supine to Sit;Sit to Sidelying Supine to sit: +2 for physical assistance;Mod assist Sit to sidelying: Min assist General bed mobility comments: Assist to bring trunk up and hips around.    Exercises     PT Diagnosis: Difficulty walking;Acute pain  PT Problem List: Decreased strength;Decreased range of motion;Decreased activity tolerance;Decreased balance;Decreased mobility;Pain PT Treatment Interventions: DME instruction;Functional mobility training;Therapeutic activities;Therapeutic exercise;Balance  training;Patient/family education     PT Goals(Current goals can be found in the care plan section) Acute Rehab PT Goals Patient Stated Goal: Go to rehab and then go home. PT Goal Formulation: With patient Time For Goal Achievement: 04/24/13 Potential to Achieve Goals: Good  Visit Information  Last PT Received On: 04/17/13 Assistance Needed: +2 History of Present Illness: Pt s/p rt BKA. Pt with h/o lt BKA, DM, PVD, neuropathy, CAD.       Prior Functioning  Home Living Family/patient expects to be discharged to:: Inpatient rehab Living Arrangements: Children Available Help at Discharge: Available 24 hours/day;Family Type of Home: House Home Access: Ramped entrance Home Layout: One level Home Equipment: Walker - 2 wheels;Bedside commode;Shower seat;Grab bars - tub/shower;Wheelchair - manual Prior Function Level of Independence: Independent with assistive device(s) Comments: amb with prosthesis and walker Communication Communication: No difficulties Dominant Hand: Right    Cognition  Cognition Arousal/Alertness: Awake/alert Behavior During Therapy: WFL for tasks assessed/performed Overall Cognitive Status: Within Functional Limits for tasks assessed    Extremity/Trunk Assessment Upper Extremity Assessment Upper Extremity Assessment: Defer to OT evaluation Lower Extremity Assessment Lower Extremity Assessment: RLE deficits/detail;LLE deficits/detail RLE Deficits / Details: Able to move against gravity with incr time but limited by pain. RLE: Unable to fully assess due to pain RLE Sensation: history of peripheral neuropathy LLE Deficits / Details: left BKA   Balance Balance Overall balance assessment: Needs assistance Sitting-balance support: Bilateral upper extremity supported Sitting balance-Leahy Scale: Fair Dynamic Sitting - Comments: Pt sat EOB x 15 minutes. Initially required min A but then able to sit unsupported.  Able to lean to lt side and prop on elbow for  change of  position.  End of Session PT - End of Session Activity Tolerance: Patient limited by pain Patient left: in bed;with call bell/phone within reach Nurse Communication: Mobility status  GP     [facility] Center[facility]YCOCK,Kamil Mchaffie 04/17/2013, 10:56 AM  [facility] PT [phone]

## 2013-04-17 NOTE — Progress Notes (Signed)
    Subjective  - POD #3, sp R BKA  No complaints this am   Physical Exam:  Minimal drainage from BKA on medial and lateral side Incision intact Vac to right groin       Assessment/Plan:  POD #3  Stable from vascular perspective. Continue current care  Kensly Bowmer IV, V. WELLS 04/17/2013 9:28 AM --  Filed Vitals:   04/17/13 0409  BP: 131/54  Pulse: 81  Temp: 99.4 F (37.4 C)  Resp: 21    Intake/Output Summary (Last 24 hours) at 04/17/13 0928 Last data filed at 04/17/13 0600  Gross per 24 hour  Intake    480 ml  Output    300 ml  Net    180 ml     Laboratory CBC    Component Value Date/Time   WBC 14.6* 04/17/2013 0500   HGB 11.0* 04/17/2013 0500   HCT 32.2* 04/17/2013 0500   PLT 253 04/17/2013 0500    BMET    Component Value Date/Time   NA 133* 04/15/2013 0510   K 4.8 04/15/2013 0510   CL 96 04/15/2013 0510   CO2 26 04/15/2013 0510   GLUCOSE 209* 04/15/2013 0510   BUN 20 04/15/2013 0510   CREATININE 1.33 04/15/2013 0510   CALCIUM 8.7 04/15/2013 0510   GFRNONAA 54* 04/15/2013 0510   GFRAA 63* 04/15/2013 0510    COAG Lab Results  Component Value Date   INR 1.17 04/12/2013   INR 0.97 03/23/2013   INR 2.01* 08/04/2012   No results found for this basename: PTT    Antibiotics Anti-infectives   Start     Dose/Rate Route Frequency Ordered Stop   04/13/13 1000  vancomycin (VANCOCIN) 1,250 mg in sodium chloride 0.9 % 250 mL IVPB     1,250 mg 166.7 mL/hr over 90 Minutes Intravenous Every 12 hours 04/12/13 2039     04/12/13 2200  piperacillin-tazobactam (ZOSYN) IVPB 3.375 g  Status:  Discontinued     3.375 g 12.5 mL/hr over 240 Minutes Intravenous 3 times per day 04/12/13 1953 04/12/13 2044   04/12/13 2200  aztreonam (AZACTAM) 2 g in dextrose 5 % 50 mL IVPB     2 g 100 mL/hr over 30 Minutes Intravenous 3 times per day 04/12/13 2045     04/12/13 2100  vancomycin (VANCOCIN) 2,500 mg in sodium chloride 0.9 % 500 mL IVPB     2,500 mg 250 mL/hr over 120 Minutes  Intravenous  Once 04/12/13 2039 04/13/13 0240       V. Charlena CrossWells Linnell Swords IV, M.D. [facility] [place] Office: [phone] Pager:  [phone]

## 2013-04-18 LAB — URINE CULTURE
Colony Count: NO GROWTH
Culture: NO GROWTH
Special Requests: NORMAL

## 2013-04-18 LAB — VANCOMYCIN, TROUGH: Vancomycin Tr: 13.2 ug/mL (ref 10.0–20.0)

## 2013-04-18 LAB — GLUCOSE, CAPILLARY
GLUCOSE-CAPILLARY: 95 mg/dL (ref 70–99)
GLUCOSE-CAPILLARY: 136 mg/dL — AB (ref 70–99)
GLUCOSE-CAPILLARY: 279 mg/dL — AB (ref 70–99)
Glucose-Capillary: 46 mg/dL — ABNORMAL LOW (ref 70–99)
Glucose-Capillary: 232 mg/dL — ABNORMAL HIGH (ref 70–99)

## 2013-04-18 MED ORDER — VANCOMYCIN HCL 10 G IV SOLR
1500.0000 mg | Freq: Two times a day (BID) | INTRAVENOUS | Status: DC
  Administered 2013-04-19 – 2013-04-21 (×5): 1500 mg via INTRAVENOUS
  Filled 2013-04-18 (×6): qty 1500

## 2013-04-18 MED ORDER — GLUCOSE 40 % PO GEL
ORAL | Status: AC
  Administered 2013-04-18: 37.5 g
  Filled 2013-04-18: qty 1

## 2013-04-18 MED ORDER — INSULIN GLARGINE 100 UNIT/ML ~~LOC~~ SOLN
55.0000 [IU] | Freq: Two times a day (BID) | SUBCUTANEOUS | Status: DC
  Administered 2013-04-18 (×2): 55 [IU] via SUBCUTANEOUS
  Filled 2013-04-18 (×4): qty 0.55

## 2013-04-18 NOTE — Consult Note (Signed)
[name]  Leroy [name] GYB:[id number] DOB: October 22, 1945 DOA: 04/12/2013 PCP: [name], MD  Assessment/Plan: 1. 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 DM uncontrolled:  HbA1C 12.4.  Patient admits to non compliance.  Lantus increased to 62 units BID 04/15/13, also with SSI.  CBG's getting lower after BKA and infection removed. Will decrease coverage today. 3. Fever: afebrile today. CXR atx vs pna. On broad spectrum abx. rec incentiv spirometer. Would recheck WBC tin am 4. CAD s/p CABG: continue home regimen. Cardiology has seen and he will follow up as outpatient.  5. PAF: on xarelto.  Patient not taking due to price.  Will need to resume. Cont BB.  6. CHF chronic stable: cont ACE 7. HTN: fair control. No changes.  8. Prophylaxis: per VVS  Procedures:  R BKA 04/14/13  HPI/Subjective: Groggy, no acute compaints  Objective: Filed Vitals:   04/18/13 0524  BP: 123/70  Pulse: 81  Temp: 97.5 F (36.4 C)  Resp: 19    Intake/Output Summary (Last 24 hours) at 04/18/13 0850 Last data filed at 04/17/13 2200  Gross per 24 hour  Intake    480 ml  Output   2000 ml  Net  -1520 ml   Filed Weights   04/12/13 1919  Weight: 129.4 kg (285 lb 4.4 oz)    Exam:   General:  NAD, alert and back to baseline  Cardiovascular: RRR no mrg  Respiratory: CTAB good air movement  Musculoskeletal: right BKA incision clean   Data Reviewed: Basic Metabolic Panel:  Recent Labs Lab 04/12/13 2155 04/14/13 0542 04/15/13 0510  NA 135* 134* 133*  K 4.3 4.2 4.8  CL 97 97 96  CO2 24 24 26   GLUCOSE 207* 303* 209*  BUN 14 18 20   CREATININE 1.06 1.18 1.33  CALCIUM 9.1 8.5 8.7   Liver Function Tests:  Recent Labs Lab 04/12/13 2155  AST 16  ALT 17  ALKPHOS 119*  BILITOT 0.3  PROT 7.9  ALBUMIN 2.7*   No results found for this basename: LIPASE, AMYLASE,  in the last 168 hours No results found for this basename: AMMONIA,  in the last 168 hours CBC:  Recent  Labs Lab 04/12/13 2155 04/14/13 0542 04/15/13 0510 04/17/13 0500  WBC 15.4* 13.0* 12.4* 14.6*  NEUTROABS  --   --  8.9*  --   HGB 14.0 11.7* 12.4* 11.0*  HCT 39.4 34.4* 37.0* 32.2*  MCV 87.8 88.2 90.0 88.5  PLT 271 247 277 253   Cardiac Enzymes: No results found for this basename: CKTOTAL, CKMB, CKMBINDEX, TROPONINI,  in the last 168 hours BNP (last 3 results)  Recent Labs  08/04/12 1232  PROBNP 688.1*   CBG:  Recent Labs Lab 04/17/13 1126 04/17/13 1612 04/17/13 2058 04/18/13 0558 04/18/13 0638  GLUCAP 187* 182* 118* 46* 95    Recent Results (from the past 240 hour(s))  CULTURE, BLOOD (ROUTINE X 2)     Status: None   Collection Time    04/13/13  9:35 PM      Result Value Range Status   Specimen Description BLOOD LEFT ARM   Final   Special Requests BOTTLES DRAWN AEROBIC ONLY 10CC   Final   Culture  Setup Time     Final   Value: 04/14/2013 01:48     Performed at [facility]   Culture     Final   Value:        BLOOD CULTURE RECEIVED NO GROWTH TO  DATE CULTURE WILL BE HELD FOR 5 DAYS BEFORE ISSUING A FINAL NEGATIVE REPORT     Performed at [facility]   Report Status PENDING   Incomplete  CULTURE, BLOOD (ROUTINE X 2)     Status: None   Collection Time    04/13/13  9:40 PM      Result Value Range Status   Specimen Description BLOOD LEFT HAND   Final   Special Requests BOTTLES DRAWN AEROBIC ONLY 4CC   Final   Culture  Setup Time     Final   Value: 04/14/2013 01:50     Performed at [facility]   Culture     Final   Value:        BLOOD CULTURE RECEIVED NO GROWTH TO DATE CULTURE WILL BE HELD FOR 5 DAYS BEFORE ISSUING A FINAL NEGATIVE REPORT     Performed at [facility]   Report Status PENDING   Incomplete  MRSA PCR SCREENING     Status: None   Collection Time    04/14/13  1:39 PM      Result Value Range Status   MRSA by PCR NEGATIVE  NEGATIVE Final   Comment:            The GeneXpert MRSA Assay (FDA     approved for NASAL  specimens     only), is one component of a     comprehensive MRSA colonization     surveillance program. It is not     intended to diagnose MRSA     infection nor to guide or     monitor treatment for     MRSA infections.     Studies: No results found.  Scheduled Meds: . aztreonam  2 g Intravenous Q8H  . enoxaparin (LOVENOX) injection  60 mg Subcutaneous Daily  . ezetimibe  10 mg Oral Daily  . insulin aspart  0-15 Units Subcutaneous TID WC  . insulin aspart  0-5 Units Subcutaneous QHS  . insulin aspart  3 Units Subcutaneous TID WC  . insulin glargine  62 Units Subcutaneous BID  . lisinopril  5 mg Oral Daily  . metoprolol succinate  50 mg Oral BID  . pantoprazole  40 mg Oral Daily  . sodium chloride  3 mL Intravenous Q12H  . vancomycin  1,250 mg Intravenous Q12H   Continuous Infusions: . lactated ringers 50 mL/hr at 04/14/13 1420    Active Problems:   CAD   Type II or unspecified type diabetes mellitus with neurological manifestations, uncontrolled(250.62)   Critical lower limb ischemia   Gangrene of foot    Time spent: 20 minutes    [facility] IncWALSH,CATHERINE  [facility] Pager [phone]. If 7PM-7AM, please contact night-coverage at [email or web address], password [email or web address] 04/18/2013, 8:50 AM  LOS: 6 days

## 2013-04-18 NOTE — Progress Notes (Signed)
Hypoglycemic Event  CBG: 46  Treatment: 15 GM gel  Symptoms: Sweaty  Follow-up CBG: [id number]:0638 CBG Result:95  Possible Reasons for Event: Unknown    Leroy [name], Leroy [name] R  Remember to initiate Hypoglycemia Order Set & complete

## 2013-04-18 NOTE — Progress Notes (Signed)
OT Cancellation Note  Patient Details Name: Leroy [name] MRN: [id number] DOB: 01-13-1946   Cancelled Treatment:    Reason Eval/Treat Not Completed: Patient at procedure or test/ unavailable (getting cleaned up by nursing staff, then lunch)  Kimberlyn Quiocho A 04/18/2013, 12:04 PM

## 2013-04-18 NOTE — Progress Notes (Addendum)
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 Statins     Muscle aches  . Amlodipine Besylate     Muscle aches making difficult to walk  . Cephalexin     REACTION: Hives    Patient Measurements: Height: 5\' 9"  (175.3 cm) Weight: 285 lb 4.4 oz (129.4 kg) IBW/kg (Calculated) : 70.7   Vital Signs: Temp: 97.5 F (36.4 C) (01/11 0524) Temp src: Oral (01/11 0524) BP: 123/70 mmHg (01/11 0524) Pulse Rate: 81 (01/11 0524) Intake/Output from previous day: 01/10 0701 - 01/11 0700 In: 720 [P.O.:720] Out: 2000 [Urine:2000] Intake/Output from this shift: Total I/O In: 240 [P.O.:240] Out: -   Labs:  Recent Labs  04/17/13 0500  WBC 14.6*  HGB 11.0*  PLT 253   Estimated Creatinine Clearance: 72.8 ml/min (by C-G formula based on Cr of 1.33). No results found for this basename: VANCOTROUGH, VANCOPEAK, VANCORANDOM, GENTTROUGH, GENTPEAK, GENTRANDOM, TOBRATROUGH, TOBRAPEAK, TOBRARND, AMIKACINPEAK, AMIKACINTROU, AMIKACIN,  in the last 72 hours    Assessment: 57 YOM with gangrene of right great toe s/p RBKA on 04/14/13 on vancomycin and aztreonam. CrCl ~ 72 mL/min. WBC trended up to 14.6 yesterday. No labs today. Tmax 99.37F.  1/5 Aztreonam>>  1/6 Vanc >>   1/6 Bld x2>>   Goal of Therapy:  Vancomycin trough level 15-20 mcg/ml  Plan:  - Vancomycin 1250 mg IV Q 12 hrs - Aztreonam 2gm IV Q 8 hours  - f/u AM CBC, Bmet, renal fx and patient's clinical status - F/u Vanc trough scheduled for this evening  Vinnie Level, PharmD.  Clinical Pharmacist Pager [phone]    Addendum: Vancoymycin trough tonight on 1250mg  IV q12h regimen = 13.2. Desired goal 15-20 mcg/ml  Will increase Vancomycin dose to 1500mg  IV q12h (estimated trough 16 on this regimen).  [employer], Pharm.D., BCPS Clinical Pharmacist Pager [phone] 04/18/2013 7:57 PM

## 2013-04-18 NOTE — Progress Notes (Signed)
    Subjective  - POD #4  No complaints   Physical Exam:  Right groin vac in place BKA stump looks good       Assessment/Plan:  POD #4  con't routine post-op care Dr. Imogene Burn will be back tomorrow  Leroy [name] IV, Leroy [name] 04/18/2013 8:35 AM --  Ceasar Mons Vitals:   04/18/13 0524  BP: 123/70  Pulse: 81  Temp: 97.5 F (36.4 C)  Resp: 19    Intake/Output Summary (Last 24 hours) at 04/18/13 0835 Last data filed at 04/17/13 2200  Gross per 24 hour  Intake    480 ml  Output   2000 ml  Net  -1520 ml     Laboratory CBC    Component Value Date/Time   WBC 14.6* 04/17/2013 0500   HGB 11.0* 04/17/2013 0500   HCT 32.2* 04/17/2013 0500   PLT 253 04/17/2013 0500    BMET    Component Value Date/Time   NA 133* 04/15/2013 0510   K 4.8 04/15/2013 0510   CL 96 04/15/2013 0510   CO2 26 04/15/2013 0510   GLUCOSE 209* 04/15/2013 0510   BUN 20 04/15/2013 0510   CREATININE 1.33 04/15/2013 0510   CALCIUM 8.7 04/15/2013 0510   GFRNONAA 54* 04/15/2013 0510   GFRAA 63* 04/15/2013 0510    COAG Lab Results  Component Value Date   INR 1.17 04/12/2013   INR 0.97 03/23/2013   INR 2.01* 08/04/2012   No results found for this basename: PTT    Antibiotics Anti-infectives   Start     Dose/Rate Route Frequency Ordered Stop   04/13/13 1000  vancomycin (VANCOCIN) 1,250 mg in sodium chloride 0.9 % 250 mL IVPB     1,250 mg 166.7 mL/hr over 90 Minutes Intravenous Every 12 hours 04/12/13 2039     04/12/13 2200  piperacillin-tazobactam (ZOSYN) IVPB 3.375 g  Status:  Discontinued     3.375 g 12.5 mL/hr over 240 Minutes Intravenous 3 times per day 04/12/13 1953 04/12/13 2044   04/12/13 2200  aztreonam (AZACTAM) 2 g in dextrose 5 % 50 mL IVPB     2 g 100 mL/hr over 30 Minutes Intravenous 3 times per day 04/12/13 2045     04/12/13 2100  vancomycin (VANCOCIN) 2,500 mg in sodium chloride 0.9 % 500 mL IVPB     2,500 mg 250 mL/hr over 120 Minutes Intravenous  Once 04/12/13 2039 04/13/13 0240       V. Charlena Cross, M.D. [facility] [place] Office: [phone] Pager:  [phone]

## 2013-04-19 ENCOUNTER — Encounter (HOSPITAL_COMMUNITY): Payer: Self-pay | Admitting: Vascular Surgery

## 2013-04-19 LAB — CBC
HEMATOCRIT: 29.4 % — AB (ref 39.0–52.0)
HEMOGLOBIN: 9.9 g/dL — AB (ref 13.0–17.0)
MCH: 29.6 pg (ref 26.0–34.0)
MCHC: 33.7 g/dL (ref 30.0–36.0)
MCV: 87.8 fL (ref 78.0–100.0)
Platelets: 268 10*3/uL (ref 150–400)
RBC: 3.35 MIL/uL — AB (ref 4.22–5.81)
RDW: 12.7 % (ref 11.5–15.5)
WBC: 9.4 10*3/uL (ref 4.0–10.5)

## 2013-04-19 LAB — BASIC METABOLIC PANEL
BUN: 15 mg/dL (ref 6–23)
CALCIUM: 8.3 mg/dL — AB (ref 8.4–10.5)
CO2: 26 mEq/L (ref 19–32)
CREATININE: 0.96 mg/dL (ref 0.50–1.35)
Chloride: 98 mEq/L (ref 96–112)
GFR calc Af Amer: >90 mL/min (ref >90)
GFR calc non Af Amer: 84 mL/min — ABNORMAL LOW (ref >90)
GLUCOSE: 65 mg/dL — AB (ref 70–99)
Potassium: 3.8 mEq/L (ref 3.7–5.3)
Sodium: 136 mEq/L — ABNORMAL LOW (ref 137–147)

## 2013-04-19 LAB — GLUCOSE, CAPILLARY
GLUCOSE-CAPILLARY: 164 mg/dL — AB (ref 70–99)
Glucose-Capillary: 49 mg/dL — ABNORMAL LOW (ref 70–99)
Glucose-Capillary: 50 mg/dL — ABNORMAL LOW (ref 70–99)
Glucose-Capillary: 176 mg/dL — ABNORMAL HIGH (ref 70–99)
Glucose-Capillary: 111 mg/dL — ABNORMAL HIGH (ref 70–99)
Glucose-Capillary: 197 mg/dL — ABNORMAL HIGH (ref 70–99)

## 2013-04-19 MED ORDER — RIVAROXABAN 20 MG PO TABS
20.0000 mg | ORAL_TABLET | Freq: Every day | ORAL | Status: DC
  Administered 2013-04-20 – 2013-04-21 (×2): 20 mg via ORAL
  Filled 2013-04-19 (×4): qty 1

## 2013-04-19 MED ORDER — INSULIN GLARGINE 100 UNIT/ML ~~LOC~~ SOLN
45.0000 [IU] | Freq: Two times a day (BID) | SUBCUTANEOUS | Status: DC
  Administered 2013-04-19 – 2013-04-21 (×5): 45 [IU] via SUBCUTANEOUS
  Filled 2013-04-19 (×6): qty 0.45

## 2013-04-19 MED ORDER — GLUCOSE 40 % PO GEL
ORAL | Status: AC
  Administered 2013-04-19: 37.5 g
  Filled 2013-04-19: qty 1

## 2013-04-19 NOTE — Progress Notes (Signed)
Hypoglycemic Event  CBG: 50  Treatment: 15 GM carbohydrate snack  Symptoms: None  Follow-up CBG: AQTM:2263 CBG Result:164  Possible Reasons for Event: unknown    Ala Bent A  Remember to initiate Hypoglycemia Order Set & complete

## 2013-04-19 NOTE — Progress Notes (Addendum)
   Daily Progress Note  Assessment/Planning: POD #5 s/p R BKA, R groin debridement   Uncontrolled diabetes: Hypoglycemic this AM, likely reflect underlying resolving infection  S/p iliofem. EA with BPA: R groin clean with some ischemic appearing fat, cont VAC dressing  PT: CIR, awaiting OT c/s, awaiting CIR eval  Fever: unclear source of infection at this time, as fever continued even after BKA.  R groin clinical not source, UA negative, CXR negative.  Possible residual bacteremia,  WBC normalized at this point, BC x 2: negative, finish 10 day course of abx  PAF: elected to restart Xarelto as no evidence of bleeding from right groin  Subjective  - 5 Days Post-Op  No complaints  Objective Filed Vitals:   04/18/13 1203 04/18/13 1205 04/18/13 2035 04/19/13 0413  BP: 141/51 141/51 163/84 140/71  Pulse: 101 101 84 77  Temp:  98.9 F (37.2 C) 99.5 F (37.5 C) 98.4 F (36.9 C)  TempSrc:  Oral Oral Oral  Resp:  20 21 20   Height:      Weight:      SpO2:  100% 96% 95%    Intake/Output Summary (Last 24 hours) at 04/19/13 0726 Last data filed at 04/19/13 0235  Gross per 24 hour  Intake    720 ml  Output   2352 ml  Net  -1632 ml    PULM  CTAB CV  RRR GI  soft, NTND VASC  R AKA stump clean with one area of skin bleeding controlled with pressure, viable stump  Laboratory CBC    Component Value Date/Time   WBC 9.4 04/19/2013 0525   HGB 9.9* 04/19/2013 0525   HCT 29.4* 04/19/2013 0525   PLT 268 04/19/2013 0525    BMET    Component Value Date/Time   NA 136* 04/19/2013 0525   K 3.8 04/19/2013 0525   CL 98 04/19/2013 0525   CO2 26 04/19/2013 0525   GLUCOSE 65* 04/19/2013 0525   BUN 15 04/19/2013 0525   CREATININE 0.96 04/19/2013 0525   CALCIUM 8.3* 04/19/2013 0525   GFRNONAA 84* 04/19/2013 0525   GFRAA >90 04/19/2013 0525    [name], MD [facility] Office: [phone] Pager: [phone]  04/19/2013, 7:26 AM

## 2013-04-19 NOTE — Progress Notes (Signed)
Await updated PT notes so that I can pursue [facility] approval for an inpt rehab admission. We have contacted PT this am. [phone]

## 2013-04-19 NOTE — Consult Note (Signed)
[name]  Leroy GriffithsWilliam J Chim WJX:[id number] DOB: Dec 10, 1945 DOA: 04/12/2013 PCP: [name], MD  Assessment/Plan: 1. 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 DM uncontrolled:  HbA1C 12.4.  Patient admits to non compliance.  Experiencing hypoglycemia on high dose lantus after removal of infected RLE.  Decrease lantus further. DC meal standing meal coverage. 3. CAD s/p CABG: continue home regimen. Cardiology has seen and he will follow up as outpatient.  4. PAF: on xarelto.  Cont BB.  5. CHF chronic stable: cont ACE 6. HTN: fair control. No changes at this time. 7. Prophylaxis: per VVS  Procedures:  R BKA 04/14/13  HPI/Subjective: Groggy, no acute compaints  Objective: Filed Vitals:   04/19/13 0413  BP: 140/71  Pulse: 77  Temp: 98.4 F (36.9 C)  Resp: 20    Intake/Output Summary (Last 24 hours) at 04/19/13 1208 Last data filed at 04/19/13 0900  Gross per 24 hour  Intake    720 ml  Output   2602 ml  Net  -1882 ml   Filed Weights   04/12/13 1919  Weight: 129.4 kg (285 lb 4.4 oz)    Exam:   General:  NAD, alert and back to baseline  Cardiovascular: RRR no mrg  Respiratory: CTAB good air movement  Musculoskeletal: right BKA incision clean   Data Reviewed: Basic Metabolic Panel:  Recent Labs Lab 04/12/13 2155 04/14/13 0542 04/15/13 0510 04/19/13 0525  NA 135* 134* 133* 136*  K 4.3 4.2 4.8 3.8  CL 97 97 96 98  CO2 24 24 26 26   GLUCOSE 207* 303* 209* 65*  BUN 14 18 20 15   CREATININE 1.06 1.18 1.33 0.96  CALCIUM 9.1 8.5 8.7 8.3*   Liver Function Tests:  Recent Labs Lab 04/12/13 2155  AST 16  ALT 17  ALKPHOS 119*  BILITOT 0.3  PROT 7.9  ALBUMIN 2.7*   No results found for this basename: LIPASE, AMYLASE,  in the last 168 hours No results found for this basename: AMMONIA,  in the last 168 hours CBC:  Recent Labs Lab 04/12/13 2155 04/14/13 0542 04/15/13 0510 04/17/13 0500 04/19/13 0525  WBC 15.4* 13.0* 12.4* 14.6*  9.4  NEUTROABS  --   --  8.9*  --   --   HGB 14.0 11.7* 12.4* 11.0* 9.9*  HCT 39.4 34.4* 37.0* 32.2* 29.4*  MCV 87.8 88.2 90.0 88.5 87.8  PLT 271 247 277 253 268   Cardiac Enzymes: No results found for this basename: CKTOTAL, CKMB, CKMBINDEX, TROPONINI,  in the last 168 hours BNP (last 3 results)  Recent Labs  08/04/12 1232  PROBNP 688.1*   CBG:  Recent Labs Lab 04/18/13 2055 04/19/13 0619 04/19/13 0709 04/19/13 0822 04/19/13 1125  GLUCAP 232* 49* 50* 164* 176*    Recent Results (from the past 240 hour(s))  CULTURE, BLOOD (ROUTINE X 2)     Status: None   Collection Time    04/13/13  9:35 PM      Result Value Range Status   Specimen Description BLOOD LEFT ARM   Final   Special Requests BOTTLES DRAWN AEROBIC ONLY 10CC   Final   Culture  Setup Time     Final   Value: 04/14/2013 01:48     Performed at [facility]   Culture     Final   Value:        BLOOD CULTURE RECEIVED NO GROWTH TO DATE CULTURE WILL BE HELD FOR 5 DAYS BEFORE ISSUING A FINAL  NEGATIVE REPORT     Performed at [facility]   Report Status PENDING   Incomplete  CULTURE, BLOOD (ROUTINE X 2)     Status: None   Collection Time    04/13/13  9:40 PM      Result Value Range Status   Specimen Description BLOOD LEFT HAND   Final   Special Requests BOTTLES DRAWN AEROBIC ONLY 4CC   Final   Culture  Setup Time     Final   Value: 04/14/2013 01:50     Performed at [facility]   Culture     Final   Value:        BLOOD CULTURE RECEIVED NO GROWTH TO DATE CULTURE WILL BE HELD FOR 5 DAYS BEFORE ISSUING A FINAL NEGATIVE REPORT     Performed at [facility]   Report Status PENDING   Incomplete  MRSA PCR SCREENING     Status: None   Collection Time    04/14/13  1:39 PM      Result Value Range Status   MRSA by PCR NEGATIVE  NEGATIVE Final   Comment:            The GeneXpert MRSA Assay (FDA     approved for NASAL specimens     only), is one component of a     comprehensive MRSA  colonization     surveillance program. It is not     intended to diagnose MRSA     infection nor to guide or     monitor treatment for     MRSA infections.  URINE CULTURE     Status: None   Collection Time    04/16/13  5:18 PM      Result Value Range Status   Specimen Description URINE, RANDOM   Final   Special Requests Normal   Final   Culture  Setup Time     Final   Value: 04/17/2013 00:29     Performed at [facility] Count     Final   Value: NO GROWTH     Performed at [facility]   Culture     Final   Value: NO GROWTH     Performed at [facility]   Report Status 04/18/2013 FINAL   Final     Studies: No results found.  Scheduled Meds: . aztreonam  2 g Intravenous Q8H  . ezetimibe  10 mg Oral Daily  . insulin aspart  0-15 Units Subcutaneous TID WC  . insulin aspart  0-5 Units Subcutaneous QHS  . insulin glargine  45 Units Subcutaneous BID  . lisinopril  5 mg Oral Daily  . metoprolol succinate  50 mg Oral BID  . pantoprazole  40 mg Oral Daily  . rivaroxaban  20 mg Oral q1800  . sodium chloride  3 mL Intravenous Q12H  . vancomycin  1,500 mg Intravenous Q12H   Continuous Infusions: . lactated ringers 50 mL/hr at 04/14/13 1420    Active Problems:   CAD   Type II or unspecified type diabetes mellitus with neurological manifestations, uncontrolled(250.62)   Critical lower limb ischemia   Gangrene of foot    Time spent: 20 minutes    [facility]  [facility] Pager [phone]. If 7PM-7AM, please contact night-coverage at [email or web address], password [facility] 04/19/2013, 12:08 PM  LOS: 7 days

## 2013-04-19 NOTE — Progress Notes (Signed)
Clinical Social Work Department BRIEF PSYCHOSOCIAL ASSESSMENT 04/19/2013  Patient:  Leroy [name], Leroy [name]     Account Number:  [id number]     Admit date:  04/12/2013  Clinical Social Worker:  Carren Rang  Date/Time:  04/19/2013 01:15 PM  Referred by:  RN  Date Referred:  04/19/2013 Referred for  SNF Placement   Other Referral:   Interview type:  Patient Other interview type:    PSYCHOSOCIAL DATA Living Status:  ALONE Admitted from facility:   Level of care:   Primary support name:  Rowell Fosnight [phone] Primary support relationship to patient:  CHILD, ADULT Degree of support available:   Good    CURRENT CONCERNS Current Concerns  Post-Acute Placement   Other Concerns:    SOCIAL WORK ASSESSMENT / PLAN Clinical Social Worker received referral for SNF placement at d/c as a backup to CIR. CSW introduced self and explained reason for visit.  CSW explained SNF process to patient as a backup plan to CIR. Patient reported he is agreeable for SNF placement and will do what he has to do to get stronger. Patient expressed his concerns for wanting to go back home, but has no support there.  Patient also stated he would speak to his brother about options and gave permission for csw to follow up with patient's daughter.   Assessment/plan status:  Psychosocial Support/Ongoing Assessment of Needs Other assessment/ plan:   Information/referral to community resources:   SNF information    PATIENT'S/FAMILY'S RESPONSE TO PLAN OF CARE: Patient stated he would be open to a snf as a backup plan to CIR. Patient stated he was feeling okay       Maree Krabbe, MSW, [place] [phone]

## 2013-04-19 NOTE — Progress Notes (Signed)
CSW spoke to patient's daughter over the phone and provided an update regarding dc plans. Patient's daughter on board with CIR or SNF. Patient's daughter thanked Child psychotherapistsocial worker for update. CSW will continue to follow patient and update patient and patient's daughter when bed offers arise. CSW awaiting [facility] insurance auth until CIR says they are unable to take patient.  Maree KrabbeLindsay Chasitty Hehl, MSW, Theresia MajorsLCSWA [phone]

## 2013-04-19 NOTE — Progress Notes (Signed)
Physical Therapy Treatment Patient Details Name: Leroy [name] MRN: [id number] DOB: 07-17-1945 Today's Date: 04/19/2013 Time: 4098-1191 PT Time Calculation (min): 66 min  PT Assessment / Plan / Recommendation  History of Present Illness Pt s/p rt BKA. Pt with h/o lt BKA, DM, PVD, neuropathy, CAD   PT Comments   Pt tolerated 60+ minute session with PT. Requires incr time for processing (appears somewhat fearful--understandably so as attempting to stand on one prosthesis). Very willing to try everything that was asked of him. He was able to don his Lt prosthesis with minguard assist (for balance) while sitting EOB. Reports he was due to have adjustments made to Lt prosthesis liner/cup PTA due to prosthesis "slipping" and needing more socks. He works with Child psychotherapist and will need consult with them while admitted to assess any current needs. Encouraged pt to keep prothesis on after session to maintain skin tolerance to wearing prosthesis. He declined due to incr sweating and fear of skin problems related to this. No skin issues on Lt residual limb noted upon removal of prosthesis.    Follow Up Recommendations  CIR     Does the patient have the potential to tolerate intense rehabilitation     Barriers to Discharge        Equipment Recommendations  None recommended by PT    Recommendations for Other Services    Frequency Min 4X/week   Progress towards PT Goals Progress towards PT goals: Progressing toward goals  Plan Current plan remains appropriate    Precautions / Restrictions Precautions Precautions: Fall   Pertinent Vitals/Pain Rt BKA 8/10 at rest; pt denied need for pain medicine "maybe when we're done." Reports he does not like how it makes him feel and he was able to fully participate in session despite incr pain to 10/10 with standing. Pt continued to deny need for meds.    Mobility  Bed Mobility Overal bed mobility: Needs Assistance Bed Mobility: Rolling;Sidelying to  Sit;Sit to Supine Rolling: Supervision Sidelying to sit: Min assist;HOB elevated Sit to supine: Min guard General bed mobility comments: Pt rolled to his Left with rail; HOB 20 degrees with min assist to come to upright sitting at EOB due to decr balance now that he is bil BKA; pt with good upper body strength as he is able to pull himself to [facility] with rails (HOB 0 degrees) Transfers Overall transfer level: Needs assistance Equipment used: Rolling walker (2 wheeled) Transfers: Sit to/from Stand Sit to Stand: Mod assist;+2 physical assistance;From elevated surface General transfer comment: bed elevated 3 inches; after donning Lt prosthesis pt able to stand twice with RW (anchored by therapist with pt using one hand on bed and one on RW) Ambulation/Gait General Gait Details: unable to unweight Lt BKA prosthesis to step/hop with RW    Exercises Amputee Exercises Quad Sets: AROM;Right;5 reps;Supine Gluteal Sets: AROM;Both;5 reps;Standing Knee Flexion: AROM;Right;5 reps;Seated Straight Leg Raises: AROM;Right;Other reps (comment);Supine   PT Diagnosis:    PT Problem List:   PT Treatment Interventions:     PT Goals (current goals can now be found in the care plan section)    Visit Information  Last PT Received On: 04/19/13 Assistance Needed: +2 History of Present Illness: Pt s/p rt BKA. Pt with h/o lt BKA, DM, PVD, neuropathy, CAD    Subjective Data  Subjective: "One minute.Marland KitchenMarland KitchenI'm thinking"   Cognition  Cognition Arousal/Alertness: Awake/alert Behavior During Therapy: WFL for tasks assessed/performed Overall Cognitive Status: Within Functional Limits for tasks assessed  Balance  Balance Overall balance assessment: Needs assistance Sitting-balance support: No upper extremity supported (bil BKA without prosthesis) Sitting balance-Leahy Scale: Good Dynamic Sitting - Comments: Sat EOB to don Lt prosthesis; able to reach forward with bil UEs to end of Lt BKA residual limb, however  with hesitancy and minguard assist as pt learns to balance without LE support on floor (and pt very "top  heavy" due to obesity and UE strength) Standing balance support: Bilateral upper extremity supported Standing balance-Leahy Scale: Poor Standing balance comment: on Lt BKA prosthesis with RW; stood 45 seconds x 2, forward flexed at hips to promote knee extension moment General Comments General comments (skin integrity, edema, etc.): Pt in sidelying on arrival with Rt knee flexed and reported incr pain/stretching when rolled supine and straightened Rt knee. Educated on need to maintain Rt knee extension for prosthetic fitting. Due to hospital bed having set knee flexion in mattress to prevent sliding down in bed, educated pt to place a pillow distal to knee and under residual limb to promote knee extension.   End of Session PT - End of Session Equipment Utilized During Treatment: Gait belt;Other (comment) (Lt BKA prosthesis) Activity Tolerance: Patient tolerated treatment well;Other (comment) (requires incr time for processing) Patient left: in bed;with call bell/phone within reach Nurse Communication: Mobility status   GP     Leroy [name] 04/19/2013, 5:01 PM Pager [phone]

## 2013-04-19 NOTE — Progress Notes (Signed)
Hypoglycemic Event  CBG: 49  Treatment: 15 GM gel  Symptoms: None  Follow-up CBG: Time:0709 CBG Result:50  Possible Reasons for Event: Unknown   Chesley Miresavis, Jhaden Pizzuto R  Remember to initiate Hypoglycemia Order Set & complete

## 2013-04-19 NOTE — Progress Notes (Signed)
Met with pt at bedside. Discussed the need to fully participate with therapy to be able to pursue with [facility] a possible admission to inpt rehab. He previously went to SNF for previous amputation. We will follow up tomorrow. We will discuss with PT the need for update assessment/progress today. [phone]

## 2013-04-19 NOTE — Progress Notes (Signed)
Noted PT therapy session. [facility] to [facility] rehab. Prior to admit, pt driving, using LLE prosthesis and distributing scrap metal. [phone]

## 2013-04-19 NOTE — Evaluation (Addendum)
Occupational Therapy Evaluation Patient Details Name: Leroy GriffithsWilliam J [name] MRN: [id number] DOB: 03-13-1946 Today's Date: 04/19/2013 Time: [id number] OT Time Calculation (min): 46 min  OT Assessment / Plan / Recommendation History of present illness Pt s/p rt BKA. Pt with h/o lt BKA, DM, PVD, neuropathy, CAD   Clinical Impression   Pt is a 68 y/o male s/p R BKA whom presents w/ deficits in his ability to perform ADL's and functional transfers (see problem list below). He should benefit from acute OT for ADL and transfer training to assist in increasing independence and eventual d/c to in-pt Rehab prior to home w/ family.    OT Assessment  Patient needs continued OT Services    Follow Up Recommendations  CIR    Barriers to Discharge      Equipment Recommendations  Other (comment) (Defer to next venue)    Recommendations for Other Services Rehab consult  Frequency  Min 2X/week    Precautions / Restrictions Precautions Precautions: Fall Precaution Comments: reports falling at home 3-4 times in 6 months Restrictions Weight Bearing Restrictions: No   Pertinent Vitals/Pain Pt reports R LE/leg pain 8/10. RN made aware. Repositioned and rest at conclusion of session.    ADL  Eating/Feeding: Simulated;Modified independent Where Assessed - Eating/Feeding: Bed level Grooming: Performed;Wash/dry hands;Wash/dry face;Teeth care;Set up Where Assessed - Grooming: Supported sitting Upper Body Bathing: Performed;Chest;Right arm;Left arm;Abdomen;Min guard Where Assessed - Upper Body Bathing: Supported sitting Lower Body Bathing: Performed;Moderate assistance Where Assessed - Lower Body Bathing: Rolling right and/or left;Lean right and/or left;Supine, head of bed flat Upper Body Dressing: Performed;Min guard Where Assessed - Upper Body Dressing: Supported sitting Lower Body Dressing: Simulated;Maximal assistance Where Assessed - Lower Body Dressing: Lean right and/or left;Rolling right and/or  left Toilet Transfer: Simulated;+2 Total assistance Toilet Transfer Method: Scientist, product/process developmentAnterior-posterior;Transfer board Toilet Transfer Equipment: Extra wide drop arm bedside commode Toileting - Clothing Manipulation and Hygiene: Simulated;+2 Total assistance Where Assessed - Engineer, miningToileting Clothing Manipulation and Hygiene: Lean right and/or left Tub/Shower Transfer Method: Not assessed Transfers/Ambulation Related to ADLs: Pt performed bed mobility today w/ +1 assist.  ADL Comments: Pt was educated in role of OT and recommendations for in-pt Rehab/CIR. Pt verbalized understanding of this. Pt participated in ADL retraining session given frequent rest breaks and supported sitting/Min guard assist throughout. Pt requires increased time for all tasks. Will follow acutely.    OT Diagnosis: Generalized weakness;Acute pain  OT Problem List: Decreased strength;Decreased activity tolerance;Impaired balance (sitting and/or standing);Decreased knowledge of use of DME or AE;Pain OT Treatment Interventions: Self-care/ADL training;DME and/or AE instruction;Energy conservation;Patient/family education;Therapeutic activities;Balance training   OT Goals(Current goals can be found in the care plan section) Acute Rehab OT Goals Patient Stated Goal: Go to Rehab "Am I where I should be?" Time For Goal Achievement: 05/03/13 Potential to Achieve Goals: Good  Visit Information  Last OT Received On: 04/19/13 History of Present Illness: S/P R BKA       Prior Functioning     Home Living Family/patient expects to be discharged to:: Inpatient rehab Living Arrangements: Children Available Help at Discharge: Available 24 hours/day;Family Type of Home: House Home Access: Ramped entrance Home Layout: One level Home Equipment: Walker - 2 wheels;Bedside commode;Shower seat;Grab bars - tub/shower;Wheelchair - manual;Grab bars - toilet Additional Comments: tub shower Prior Function Level of Independence: Independent with  assistive device(s) Comments: amb with prosthesis and walker Communication Communication: No difficulties Dominant Hand: Right    Vision/Perception Vision - History Baseline Vision: Other (comment) (Has glasses does not wear  b/c "can't see with them") Visual History: Cataracts;Other (comment) (Left eye cataract)   Cognition  Cognition Arousal/Alertness: Awake/alert Behavior During Therapy: WFL for tasks assessed/performed Overall Cognitive Status: Within Functional Limits for tasks assessed    Extremity/Trunk Assessment Upper Extremity Assessment Upper Extremity Assessment: Overall WFL for tasks assessed Lower Extremity Assessment Lower Extremity Assessment: Defer to PT evaluation    Mobility Bed Mobility Overal bed mobility: Needs Assistance Bed Mobility: Rolling;Sidelying to Sit;Supine to Sit;Sit to Supine;Sit to Sidelying Rolling: Min assist Sidelying to sit: Max assist Supine to sit: Max assist Sit to supine: Mod assist Sit to sidelying: Min assist General bed mobility comments: Assist to bring trunk up and and hips around.        Balance Balance Overall balance assessment: Needs assistance Sitting-balance support: Single extremity supported;Bilateral upper extremity supported Sitting balance-Leahy Scale: Fair Dynamic Sitting - Comments: Pt sat EOB for ADL retraining session, Overall Min guard assist, fluctuating throughout. Pt was noted to keep one UE on bed for support at all times. Frequent rest breaks - Sitting approximately 10 min intervals between rest breaks.   End of Session OT - End of Session Activity Tolerance: Patient tolerated treatment well Patient left: in bed;with call bell/phone within reach Nurse Communication: Precautions;Other (comment) (Pt ready for tele to be reapplied after ADL was completed.)  GO     Alm Bustard 04/19/2013, 8:59 AM

## 2013-04-19 NOTE — Consult Note (Signed)
WOC wound follow-up consult note Reason for Consult:Vac dressing changed to right groin; VVS team following and they were in earlier to remove dressing and assess the site.  Wound type: Full thickness post-op wound Wound bed: 85% red, 15% yellow Drainage (amount, consistency, odor) Small amt yellow drainage in cannister, no odor. Periwound: Intact skin surrounding Dressing procedure/placement/frequency: Applied one piece black foam to wound with barrier ring surrounding to maintain seal in skin folds.  Cont suction on at .  Pt tolerated without c/o pain.  Plan for bedside nurse to change Q M/W/F. Please re-consult if further assistance is needed.  Thank-you,  Cammie Mcgee MSN, RN, CWOCN, [place], CNS [phone]

## 2013-04-19 NOTE — Clinical Documentation Improvement (Signed)
THIS DOCUMENT IS NOT A PERMANENT PART OF THE MEDICAL RECORD  Please update your documentation with the medical record to reflect your response to this query. If you need help knowing how to do this please call [phone].  04/19/13   Dear Dr.Della Homan/Associates,  In a better effort to capture your patient's severity of illness, reflect appropriate length of stay and utilization of resources, a review of the patient medical record has revealed the following indicators.    Based on your clinical judgment, please clarify and document in a progress note and/or discharge summary the clinical condition associated with the following supporting information:  In responding to this query please exercise your independent judgment.  The fact that a query is asked, does not imply that any particular answer is desired or expected.                                  Risk Factors: Patient with a history of CKD, noted per 01/06 progress notes.  Patient does NOT have an active diagnosis of CKD  Possible Clinical Conditions?   CKD Stage I -  GFR > OR = 90  CKD Stage II - GFR 60-80  CKD Stage III - GFR 30-59  Other condition  Cannot Clinically determine    Lab:  Bun: 01/12: 15 01/08: 20  Creat: 01/12:  0.96 01/08:  1.33  GFR:  01/12:  >90 01/08:   63   You may use possible, probable, or suspect with inpatient documentation. possible, probable, suspected diagnoses MUST be documented at the time of discharge  Reviewed:  no additional documentation provided   Thank You,  Marciano SequinWanda Mathews-Bethea,RN,BSN, Clinical Documentation Specialist: [phone] [facility]

## 2013-04-19 NOTE — Progress Notes (Signed)
Clinical Social Work Department CLINICAL SOCIAL WORK PLACEMENT NOTE 04/19/2013  Patient:  Leroy [name], Leroy [name]  Account Number:  [id number] Admit date:  04/12/2013  Clinical Social Worker:  Carren Rang  Date/time:  04/19/2013 01:37 PM  Clinical Social Work is seeking post-discharge placement for this patient at the following level of care:   SKILLED NURSING   (*CSW will update this form in Epic as items are completed)   04/19/2013  Patient/family provided with [facility] Department of Clinical Social Work's list of facilities offering this level of care within the geographic area requested by the patient (or if unable, by the patient's family).  04/19/2013  Patient/family informed of their freedom to choose among providers that offer the needed level of care, that participate in Medicare, Medicaid or managed care program needed by the patient, have an available bed and are willing to accept the patient.    Patient/family informed of [facility]' ownership interest in [facility], as well as of the fact that they are under no obligation to receive care at this facility.  PASARR submitted to EDS on  PASARR number received from EDS on   FL2 transmitted to all facilities in geographic area requested by pt/family on  04/19/2013 FL2 transmitted to all facilities within larger geographic area on   Patient informed that his/her managed care company has contracts with or will negotiate with  certain facilities, including the following:     Patient/family informed of bed offers received:   Patient chooses bed at  Physician recommends and patient chooses bed at    Patient to be transferred to  on   Patient to be transferred to facility by   The following physician request were entered in Epic:   Additional Comments: Patient has previous PASRR.  Maree Krabbe, MSW, Theresia Majors [phone]

## 2013-04-20 LAB — CULTURE, BLOOD (ROUTINE X 2)
CULTURE: NO GROWTH
Culture: NO GROWTH

## 2013-04-20 LAB — CBC
HEMATOCRIT: 32.2 % — AB (ref 39.0–52.0)
Hemoglobin: 10.8 g/dL — ABNORMAL LOW (ref 13.0–17.0)
MCH: 30 pg (ref 26.0–34.0)
MCHC: 33.5 g/dL (ref 30.0–36.0)
MCV: 89.4 fL (ref 78.0–100.0)
Platelets: 309 10*3/uL (ref 150–400)
RBC: 3.6 MIL/uL — AB (ref 4.22–5.81)
RDW: 12.8 % (ref 11.5–15.5)
WBC: 10.2 10*3/uL (ref 4.0–10.5)

## 2013-04-20 LAB — GLUCOSE, CAPILLARY
GLUCOSE-CAPILLARY: 140 mg/dL — AB (ref 70–99)
GLUCOSE-CAPILLARY: 113 mg/dL — AB (ref 70–99)
Glucose-Capillary: 68 mg/dL — ABNORMAL LOW (ref 70–99)
Glucose-Capillary: 81 mg/dL (ref 70–99)
Glucose-Capillary: 165 mg/dL — ABNORMAL HIGH (ref 70–99)

## 2013-04-20 MED ORDER — ALTEPLASE 2 MG IJ SOLR
2.0000 mg | Freq: Once | INTRAMUSCULAR | Status: AC
  Administered 2013-04-20: 2 mg
  Filled 2013-04-20: qty 2

## 2013-04-20 MED ORDER — SODIUM CHLORIDE 0.9 % IJ SOLN: 10.0000 mL | Freq: Two times a day (BID) | INTRAMUSCULAR | Status: DC

## 2013-04-20 MED ORDER — SODIUM CHLORIDE 0.9 % IJ SOLN: 10.0000 mL | INTRAMUSCULAR | Status: DC | PRN

## 2013-04-20 NOTE — Progress Notes (Signed)
I await insurance decision for rehab venue options. [phone]

## 2013-04-20 NOTE — Progress Notes (Signed)
Inpatient Diabetes Program Recommendations  AACE/ADA: New Consensus Statement on Inpatient Glycemic Control (2013)  Target Ranges:  Prepandial:   less than 140 mg/dL      Peak postprandial:   less than 180 mg/dL (1-2 hours)      Critically ill patients:  140 - 180 mg/dL   Results for LETCHER, TENNIES (MRN [id number]) as of 04/20/2013 11:21  Ref. Range 04/19/2013 08:22 04/19/2013 11:25 04/19/2013 16:18 04/19/2013 21:13 04/20/2013 06:25 04/20/2013 06:52  Glucose-Capillary Latest Range: 70-99 mg/dL 409 (H) 811 (H) 914 (H) 197 (H) 68 (L) 81    Inpatient Diabetes Program Recommendations Insulin - Basal: Noted blood glucose of 68 mg/dl this am. Please consider leaving am dose of Lantus 45 units QAM and further decrease evening dose of Lantus to 43 units QHS.  Thanks, Orlando Penner, RN, MSN, CCRN Diabetes Coordinator Inpatient Diabetes Program [phone] (Team Pager) [phone] ([facility] office) [phone] [facility] office)

## 2013-04-20 NOTE — Progress Notes (Signed)
Hypoglycemic Event  CBG: 68  Treatment: Juice  Symptoms: none  Follow-up CBG: Time: 0645 CBG Result:81  Possible Reasons for Event: Evening Lantus dose  Comments/MD notified: hypoglycemic protocol followed     Leroy [name]  Remember to initiate Hypoglycemia Order Set & complete

## 2013-04-20 NOTE — Progress Notes (Addendum)
[facility] Progress Note  04/20/2013 8:42 AM POD 6  Subjective:  C/o just a little bit of pain in the left below knee stump  Afebrile VSS  99% RA  Filed Vitals:   04/19/13 1949  BP: 132/58  Pulse: 78  Temp: 98.4 F (36.9 C)  Resp: 18    Physical Exam: Incisions:  C/d/i with staples in tact-minimal bloody drainage on bandage at medial aspect of wound.  Could not express any drainage from wound. Extremities:  Good movement of LLE  CBC    Component Value Date/Time   WBC 9.4 04/19/2013 0525   RBC 3.35* 04/19/2013 0525   HGB 9.9* 04/19/2013 0525   HCT 29.4* 04/19/2013 0525   PLT 268 04/19/2013 0525   MCV 87.8 04/19/2013 0525   MCH 29.6 04/19/2013 0525   MCHC 33.7 04/19/2013 0525   RDW 12.7 04/19/2013 0525   LYMPHSABS 1.9 04/15/2013 0510   MONOABS 1.5* 04/15/2013 0510   EOSABS 0.1 04/15/2013 0510   BASOSABS 0.0 04/15/2013 0510    BMET    Component Value Date/Time   NA 136* 04/19/2013 0525   K 3.8 04/19/2013 0525   CL 98 04/19/2013 0525   CO2 26 04/19/2013 0525   GLUCOSE 65* 04/19/2013 0525   BUN 15 04/19/2013 0525   CREATININE 0.96 04/19/2013 0525   CALCIUM 8.3* 04/19/2013 0525   GFRNONAA 84* 04/19/2013 0525   GFRAA >90 04/19/2013 0525    INR    Component Value Date/Time   INR 1.17 04/12/2013 2155   INR 1.6 04/25/2010 0904     Intake/Output Summary (Last 24 hours) at 04/20/13 0842 Last data filed at 04/19/13 2100  Gross per 24 hour  Intake    720 ml  Output   1150 ml  Net   -430 ml     Assessment/Plan:  68 y.o. male is s/p right below knee amputation  POD 6  -doing well -pain is well controlled -there is minimal bloody drainage at the medial aspect of incision on dressing-continue to monitor. -stump is viable. -continue to mobilize -awaiting CIR insurance approval -Blood cx no growth x 5 days-final   Doreatha Massed, PA-C [facility] [phone] 04/20/2013 8:42 AM  Addendum  I have independently interviewed and examined the  patient, and I agree with the physician assistant's findings.    1.  Fever: last fever 1/8.  BC x 2 officially negative, so at this point, I'm fine with D/C abx tomorrow.  PICC can come out at that time if no further medication needs. 2.  Uncontrolled diabetes: per IM mgmt 3.  S/p R iliofem EA with BPA: cont VAC dressing for now, will recheck wound tomorrow. 4.  Acute renal insufficiency: limited Cr elevated this admission, likely due to use of Vancomycin and SIRS from the infection, resolved at this point 5.  R BKA: incision intact, some drainage not unexpected as calf tissue somewhat edematous and significant padding from muscle tissue, local wound care 6. PAF: on Xarelto per Cardiology's prior regimen  Overall, pt medically stable for D/C tomorrow to SNF or CIR.  I think patient would do better in CIR, but his motivation may be the deciding factor.  [name], MD [facility] [place] Office: [phone] Pager: [phone]  04/20/2013, 9:05 AM

## 2013-04-20 NOTE — Progress Notes (Signed)
Physical Therapy Treatment Patient Details Name: Leroy [name] MRN: [id number] DOB: December 18, 1945 Today's Date: 04/20/2013 Time: 1478-2956 PT Time Calculation (min): 58 min  PT Assessment / Plan / Recommendation  History of Present Illness Pt s/p rt BKA. Pt with h/o lt BKA, DM, PVD, neuropathy, CAD   PT Comments   Pt continues to be very motivated and willing to try whatever is asked of him. Pt has good upper body strength, however cannot yet coordinate unweighting LLE via support of bil UEs on RW to allow pivot or stepping for transfer to chair. On next visit, would either attempt squat pivot (due to lack of drop-arm chair for sliding board transfer) or use lift to assist pt to chair to work on chair push-ups and standing from chair.   Follow Up Recommendations  CIR     Does the patient have the potential to tolerate intense rehabilitation     Barriers to Discharge        Equipment Recommendations  None recommended by PT    Recommendations for Other Services    Frequency Min 4X/week   Progress towards PT Goals Progress towards PT goals: Progressing toward goals  Plan Current plan remains appropriate    Precautions / Restrictions Precautions Precautions: Fall   Pertinent Vitals/Pain RLE "22/10" on arrival and convinced pt that he should take some pain medicine; RN provided medication to assist with pain control     Mobility  Bed Mobility Overal bed mobility: Needs Assistance Bed Mobility: Rolling;Sidelying to Sit;Sit to Sidelying Rolling: Supervision Sidelying to sit: Min guard;HOB elevated (HOB 10) Sit to sidelying: Min guard General bed mobility comments: supervision for rolling due to pt's size and comes very close to EOB with lower legs hanging over EOB; using rail for all mobility; vc for technique to incr efficiency and safety Transfers Overall transfer level: Needs assistance Equipment used: Rolling walker (2 wheeled) Transfers: Sit to/from Stand;Lateral/Scoot  Transfers Sit to Stand: Mod assist;+2 physical assistance;From elevated surface  Lateral/Scoot Transfers: Max assist;+2 physical assistance;From elevated surface General transfer comment: stood with RW x 1 x 60 seconds; attempted to laterally scoot along EOB, however pt could not clear his buttocks despite excellent effort (hands pushing down into soft mattress); placed a straight back chair in front of pt (facing pt) and he was able to hold onto armrests and come forward into squat position to elevate buttocks off bed. Pt unable to swing hips laterally once in this position and required assist to scoot to his Rt. Pt repeated x 4 and moved a total of 12 inches. Ambulation/Gait General Gait Details: unable to unweight Lt BKA prosthesis to step/hop with RW    Exercises Amputee Exercises Quad Sets: AROM;Right;5 reps;Supine (yellow foam under distal residual limb) Hip Extension: Strengthening;Right;5 reps;Supine;Other (comment) (isometric) Chair Push Up: AROM;Other reps (comment);Seated (attempted EOB & unable to clear hips)   PT Diagnosis:    PT Problem List:   PT Treatment Interventions:     PT Goals (current goals can now be found in the care plan section)    Visit Information  Last PT Received On: 04/20/13 Assistance Needed: +2 History of Present Illness: Pt s/p rt BKA. Pt with h/o lt BKA, DM, PVD, neuropathy, CAD    Subjective Data      Cognition  Cognition Arousal/Alertness: Awake/alert Behavior During Therapy: WFL for tasks assessed/performed Overall Cognitive Status: Within Functional Limits for tasks assessed    Balance  Balance Sitting-balance support: No upper extremity supported (bil BKA without prosthesis)  Sitting balance-Leahy Scale: Good Dynamic Sitting - Comments: Sat EOB to don Lt prosthesis; able to reach forward with bil UEs to end of Lt BKA residual limb, however with hesitancy and minguard assist as pt learns to balance without LE support on floor (and pt very  "top heavy" due to obesity and UE strength) Standing balance support: Bilateral upper extremity supported Standing balance-Leahy Scale: Poor Standing balance comment: on Lt BKA prosthesis with RW x 60 seconds;  End of Session PT - End of Session Equipment Utilized During Treatment: Gait belt;Other (comment) (Lt BKA prosthesis) Activity Tolerance: Patient tolerated treatment well Patient left: in bed;with call bell/phone within reach   GP     Leroy [name] 04/20/2013, 3:15 PM Pager [phone]

## 2013-04-20 NOTE — Consult Note (Signed)
[name]  CRU BORDAS HUO:[id number] DOB: Jan 02, 1946 DOA: 04/12/2013 PCP: [name], MD  Assessment/Plan: 1. Right foot gangrene: s/p RBKA on 04/14/13> did have continued fevers after surgery and is back on vancomycin and azactam. Cultures negative. 2. DM uncontrolled:  HbA1C 12.4.  Patient admits to non compliance.  Experiencing hypoglycemia on high dose lantus after removal of infected RLE.  Decrease lantus further. Blood sugars stabilizing 3. Anemia: new drop in hgb 11>>9.9. Recheck. No observed bleeding. Hemocult stool 4. CAD s/p CABG: continue home regimen. Cardiology has seen and he will follow up as outpatient.  5. PAF: on xarelto.  Cont BB.  6. CHF chronic stable: cont ACE 7. HTN: fair control. No changes at this time. 8. Prophylaxis: per [facility]  Procedures:  R BKA 04/14/13  HPI/Subjective: Alert, some soreness in the right thigh.  Objective: Filed Vitals:   04/20/13 1100  BP: 142/82  Pulse: 78  Temp:   Resp:     Intake/Output Summary (Last 24 hours) at 04/20/13 1300 Last data filed at 04/20/13 0800  Gross per 24 hour  Intake    720 ml  Output    400 ml  Net    320 ml   Filed Weights   04/12/13 1919  Weight: 129.4 kg (285 lb 4.4 oz)    Exam:   General:  NAD, alert   Cardiovascular: RRR no mrg  Respiratory: CTAB good air movement  Musculoskeletal: right BKA incision dressed with wound vac in place  Data Reviewed: Basic Metabolic Panel:  Recent Labs Lab 04/14/13 0542 04/15/13 0510 04/19/13 0525  NA 134* 133* 136*  K 4.2 4.8 3.8  CL 97 96 98  CO2 24 26 26   GLUCOSE 303* 209* 65*  BUN 18 20 15   CREATININE 1.18 1.33 0.96  CALCIUM 8.5 8.7 8.3*   Liver Function Tests: No results found for this basename: AST, ALT, ALKPHOS, BILITOT, PROT, ALBUMIN,  in the last 168 hours No results found for this basename: LIPASE, AMYLASE,  in the last 168 hours No results found for this basename: AMMONIA,  in the last 168  hours CBC:  Recent Labs Lab 04/14/13 0542 04/15/13 0510 04/17/13 0500 04/19/13 0525  WBC 13.0* 12.4* 14.6* 9.4  NEUTROABS  --  8.9*  --   --   HGB 11.7* 12.4* 11.0* 9.9*  HCT 34.4* 37.0* 32.2* 29.4*  MCV 88.2 90.0 88.5 87.8  PLT 247 277 253 268   Cardiac Enzymes: No results found for this basename: CKTOTAL, CKMB, CKMBINDEX, TROPONINI,  in the last 168 hours BNP (last 3 results)  Recent Labs  08/04/12 1232  PROBNP 688.1*   CBG:  Recent Labs Lab 04/19/13 1618 04/19/13 2113 04/20/13 0625 04/20/13 0652 04/20/13 1129  GLUCAP 111* 197* 68* 81 165*    Recent Results (from the past 240 hour(s))  CULTURE, BLOOD (ROUTINE X 2)     Status: None   Collection Time    04/13/13  9:35 PM      Result Value Range Status   Specimen Description BLOOD LEFT ARM   Final   Special Requests BOTTLES DRAWN AEROBIC ONLY 10CC   Final   Culture  Setup Time     Final   Value: 04/14/2013 01:48     Performed at [facility]   Culture     Final   Value: NO GROWTH 5 DAYS     Performed at [facility]   Report Status 04/20/2013 FINAL   Final  CULTURE, BLOOD (ROUTINE X 2)     Status: None   Collection Time    04/13/13  9:40 PM      Result Value Range Status   Specimen Description BLOOD LEFT HAND   Final   Special Requests BOTTLES DRAWN AEROBIC ONLY 4CC   Final   Culture  Setup Time     Final   Value: 04/14/2013 01:50     [facility]   Culture     Final   Value: NO GROWTH 5 DAYS     [facility]   Report Status 04/20/2013 FINAL   Final  MRSA PCR SCREENING     Status: None   Collection Time    04/14/13  1:39 PM      Result Value Range Status   MRSA by PCR NEGATIVE  NEGATIVE Final   Comment:            The GeneXpert MRSA Assay (FDA     approved for NASAL specimens     only), is one component of a     comprehensive MRSA colonization     surveillance program. It is not     intended to diagnose MRSA     infection nor to guide  or     monitor treatment for     MRSA infections.  URINE CULTURE     Status: None   Collection Time    04/16/13  5:18 PM      Result Value Range Status   Specimen Description URINE, RANDOM   Final   Special Requests Normal   Final   Culture  Setup Time     Final   Value: 04/17/2013 00:29     [facility]formed [facility]   Colony Count     Final   Value: NO GROWTH     [facility]   Culture     Final   Value: NO GROWTH     [facility]   Report Status 04/18/2013 FINAL   Final     Studies: No results found.  Scheduled Meds: . aztreonam  2 g Intravenous Q8H  . ezetimibe  10 mg Oral Daily  . insulin aspart  0-15 Units Subcutaneous TID WC  . insulin aspart  0-5 Units Subcutaneous QHS  . insulin glargine  45 Units Subcutaneous BID  . lisinopril  5 mg Oral Daily  . metoprolol succinate  50 mg Oral BID  . pantoprazole  40 mg Oral Daily  . rivaroxaban  20 mg Oral q1800  . sodium chloride  10-40 mL Intracatheter Q12H  . sodium chloride  3 mL Intravenous Q12H  . vancomycin  1,500 mg Intravenous Q12H   Continuous Infusions: . lactated ringers 50 mL/hr at 04/14/13 1420    Active Problems:   CAD   Type II or unspecified type diabetes mellitus with neurological manifestations, uncontrolled(250.62)   Critical lower limb ischemia   Gangrene of foot  Time spent: 20 minutes  [facility] LPWALSH,CATHERINE  [facility] Pager [phone]. If 7PM-7AM, please contact night-coverage at [email or web address], password [email or web address] 04/20/2013, 1:00 PM  LOS: 8 days

## 2013-04-21 ENCOUNTER — Inpatient Hospital Stay (HOSPITAL_COMMUNITY)
Admission: RE | Admit: 2013-04-21 | Discharge: 2013-05-07 | DRG: 945 | Disposition: A | Discharge status: Home Health | Payer: Medicare HMO | Source: Intra-hospital | Attending: Physical Medicine & Rehabilitation | Admitting: Physical Medicine & Rehabilitation

## 2013-04-21 DIAGNOSIS — E1149 Type 2 diabetes mellitus with other diabetic neurological complication: Secondary | ICD-10-CM | POA: Diagnosis not present

## 2013-04-21 DIAGNOSIS — T8132XA Disruption of internal operation (surgical) wound, not elsewhere classified, initial encounter: Secondary | ICD-10-CM | POA: Diagnosis not present

## 2013-04-21 DIAGNOSIS — Z5189 Encounter for other specified aftercare: Secondary | ICD-10-CM | POA: Diagnosis present

## 2013-04-21 DIAGNOSIS — S88119A Complete traumatic amputation at level between knee and ankle, unspecified lower leg, initial encounter: Secondary | ICD-10-CM

## 2013-04-21 DIAGNOSIS — I252 Old myocardial infarction: Secondary | ICD-10-CM

## 2013-04-21 DIAGNOSIS — G547 Phantom limb syndrome without pain: Secondary | ICD-10-CM | POA: Diagnosis not present

## 2013-04-21 DIAGNOSIS — Z79899 Other long term (current) drug therapy: Secondary | ICD-10-CM

## 2013-04-21 DIAGNOSIS — E1169 Type 2 diabetes mellitus with other specified complication: Secondary | ICD-10-CM

## 2013-04-21 DIAGNOSIS — I2589 Other forms of chronic ischemic heart disease: Secondary | ICD-10-CM | POA: Diagnosis not present

## 2013-04-21 DIAGNOSIS — N189 Chronic kidney disease, unspecified: Secondary | ICD-10-CM

## 2013-04-21 DIAGNOSIS — Z9861 Coronary angioplasty status: Secondary | ICD-10-CM

## 2013-04-21 DIAGNOSIS — I129 Hypertensive chronic kidney disease with stage 1 through stage 4 chronic kidney disease, or unspecified chronic kidney disease: Secondary | ICD-10-CM | POA: Diagnosis not present

## 2013-04-21 DIAGNOSIS — I739 Peripheral vascular disease, unspecified: Secondary | ICD-10-CM

## 2013-04-21 DIAGNOSIS — E1142 Type 2 diabetes mellitus with diabetic polyneuropathy: Secondary | ICD-10-CM

## 2013-04-21 DIAGNOSIS — Z87891 Personal history of nicotine dependence: Secondary | ICD-10-CM

## 2013-04-21 DIAGNOSIS — Z833 Family history of diabetes mellitus: Secondary | ICD-10-CM

## 2013-04-21 DIAGNOSIS — Z86718 Personal history of other venous thrombosis and embolism: Secondary | ICD-10-CM | POA: Diagnosis not present

## 2013-04-21 DIAGNOSIS — I2510 Atherosclerotic heart disease of native coronary artery without angina pectoris: Secondary | ICD-10-CM | POA: Diagnosis not present

## 2013-04-21 DIAGNOSIS — Y832 Surgical operation with anastomosis, bypass or graft as the cause of abnormal reaction of the patient, or of later complication, without mention of misadventure at the time of the procedure: Secondary | ICD-10-CM

## 2013-04-21 DIAGNOSIS — E669 Obesity, unspecified: Secondary | ICD-10-CM | POA: Diagnosis not present

## 2013-04-21 DIAGNOSIS — I4891 Unspecified atrial fibrillation: Secondary | ICD-10-CM | POA: Diagnosis not present

## 2013-04-21 DIAGNOSIS — Z8249 Family history of ischemic heart disease and other diseases of the circulatory system: Secondary | ICD-10-CM | POA: Diagnosis not present

## 2013-04-21 DIAGNOSIS — I509 Heart failure, unspecified: Secondary | ICD-10-CM

## 2013-04-21 DIAGNOSIS — E1159 Type 2 diabetes mellitus with other circulatory complications: Secondary | ICD-10-CM | POA: Diagnosis not present

## 2013-04-21 DIAGNOSIS — I5022 Chronic systolic (congestive) heart failure: Secondary | ICD-10-CM | POA: Diagnosis not present

## 2013-04-21 DIAGNOSIS — R159 Full incontinence of feces: Secondary | ICD-10-CM | POA: Diagnosis not present

## 2013-04-21 DIAGNOSIS — T81329A Deep disruption or dehiscence of operation wound, unspecified, initial encounter: Secondary | ICD-10-CM

## 2013-04-21 DIAGNOSIS — I96 Gangrene, not elsewhere classified: Secondary | ICD-10-CM

## 2013-04-21 DIAGNOSIS — E1165 Type 2 diabetes mellitus with hyperglycemia: Secondary | ICD-10-CM

## 2013-04-21 DIAGNOSIS — Z9119 Patient's noncompliance with other medical treatment and regimen: Secondary | ICD-10-CM | POA: Diagnosis not present

## 2013-04-21 DIAGNOSIS — I999 Unspecified disorder of circulatory system: Secondary | ICD-10-CM

## 2013-04-21 DIAGNOSIS — Z91199 Patient's noncompliance with other medical treatment and regimen due to unspecified reason: Secondary | ICD-10-CM

## 2013-04-21 DIAGNOSIS — E785 Hyperlipidemia, unspecified: Secondary | ICD-10-CM

## 2013-04-21 DIAGNOSIS — Z89512 Acquired absence of left leg below knee: Secondary | ICD-10-CM

## 2013-04-21 DIAGNOSIS — H269 Unspecified cataract: Secondary | ICD-10-CM | POA: Diagnosis not present

## 2013-04-21 DIAGNOSIS — L98499 Non-pressure chronic ulcer of skin of other sites with unspecified severity: Secondary | ICD-10-CM

## 2013-04-21 DIAGNOSIS — Z89511 Acquired absence of right leg below knee: Secondary | ICD-10-CM

## 2013-04-21 DIAGNOSIS — Z951 Presence of aortocoronary bypass graft: Secondary | ICD-10-CM

## 2013-04-21 DIAGNOSIS — IMO0001 Reserved for inherently not codable concepts without codable children: Secondary | ICD-10-CM

## 2013-04-21 DIAGNOSIS — I70269 Atherosclerosis of native arteries of extremities with gangrene, unspecified extremity: Secondary | ICD-10-CM

## 2013-04-21 DIAGNOSIS — I70209 Unspecified atherosclerosis of native arteries of extremities, unspecified extremity: Secondary | ICD-10-CM

## 2013-04-21 DIAGNOSIS — I10 Essential (primary) hypertension: Secondary | ICD-10-CM

## 2013-04-21 LAB — GLUCOSE, CAPILLARY
GLUCOSE-CAPILLARY: 57 mg/dL — AB (ref 70–99)
GLUCOSE-CAPILLARY: 77 mg/dL (ref 70–99)
GLUCOSE-CAPILLARY: 123 mg/dL — AB (ref 70–99)
Glucose-Capillary: 110 mg/dL — ABNORMAL HIGH (ref 70–99)
Glucose-Capillary: 158 mg/dL — ABNORMAL HIGH (ref 70–99)
Glucose-Capillary: 156 mg/dL — ABNORMAL HIGH (ref 70–99)

## 2013-04-21 MED ORDER — RIVAROXABAN 20 MG PO TABS: 20.0000 mg | ORAL_TABLET | Freq: Every day | ORAL | Status: DC

## 2013-04-21 MED ORDER — SORBITOL 70 % SOLN: 30.0000 mL | Freq: Every day | Status: DC | PRN

## 2013-04-21 MED ORDER — PANTOPRAZOLE SODIUM 40 MG PO TBEC: 40.0000 mg | DELAYED_RELEASE_TABLET | Freq: Every day | ORAL | Status: DC

## 2013-04-21 MED ORDER — ACETAMINOPHEN 325 MG PO TABS: 650.0000 mg | ORAL_TABLET | Freq: Four times a day (QID) | ORAL | Status: DC | PRN

## 2013-04-21 MED ORDER — NITROGLYCERIN 0.4 MG SL SUBL: 0.4000 mg | SUBLINGUAL_TABLET | SUBLINGUAL | Status: DC | PRN

## 2013-04-21 MED ORDER — ONDANSETRON HCL 4 MG/2ML IJ SOLN: 4.0000 mg | Freq: Four times a day (QID) | INTRAMUSCULAR | Status: DC | PRN

## 2013-04-21 MED ORDER — LISINOPRIL 5 MG PO TABS
5.0000 mg | ORAL_TABLET | Freq: Every day | ORAL | Status: DC
  Administered 2013-04-22 – 2013-05-07 (×16): 5 mg via ORAL
  Filled 2013-04-21 (×17): qty 1

## 2013-04-21 MED ORDER — INSULIN ASPART 100 UNIT/ML ~~LOC~~ SOLN: 0.0000 [IU] | Freq: Every day | SUBCUTANEOUS | Status: DC

## 2013-04-21 MED ORDER — INSULIN GLARGINE 100 UNIT/ML ~~LOC~~ SOLN: 40.0000 [IU] | Freq: Two times a day (BID) | SUBCUTANEOUS | Status: DC

## 2013-04-21 MED ORDER — ONDANSETRON HCL 4 MG PO TABS: 4.0000 mg | ORAL_TABLET | Freq: Four times a day (QID) | ORAL | Status: DC | PRN

## 2013-04-21 MED ORDER — OXYCODONE HCL 5 MG PO TABS
5.0000 mg | ORAL_TABLET | ORAL | Status: DC | PRN
  Administered 2013-04-21 – 2013-05-06 (×24): 10 mg via ORAL
  Filled 2013-04-21 (×25): qty 2

## 2013-04-21 MED ORDER — METOPROLOL SUCCINATE ER 50 MG PO TB24
50.0000 mg | ORAL_TABLET | Freq: Two times a day (BID) | ORAL | Status: DC
  Administered 2013-04-21 – 2013-05-07 (×32): 50 mg via ORAL
  Filled 2013-04-21 (×35): qty 1

## 2013-04-21 MED ORDER — OXYCODONE HCL 5 MG PO TABS: 5.0000 mg | ORAL_TABLET | ORAL | Status: DC | PRN

## 2013-04-21 MED ORDER — METOPROLOL SUCCINATE ER 50 MG PO TB24: 50.0000 mg | ORAL_TABLET | Freq: Two times a day (BID) | ORAL | Status: DC

## 2013-04-21 MED ORDER — INSULIN GLARGINE 100 UNIT/ML ~~LOC~~ SOLN
40.0000 [IU] | Freq: Two times a day (BID) | SUBCUTANEOUS | Status: DC
  Filled 2013-04-21: qty 0.4

## 2013-04-21 MED ORDER — INSULIN GLARGINE 100 UNIT/ML ~~LOC~~ SOLN
35.0000 [IU] | Freq: Two times a day (BID) | SUBCUTANEOUS | Status: DC
  Filled 2013-04-21: qty 0.35

## 2013-04-21 MED ORDER — PANTOPRAZOLE SODIUM 40 MG PO TBEC
40.0000 mg | DELAYED_RELEASE_TABLET | Freq: Every day | ORAL | Status: DC
  Administered 2013-04-22 – 2013-05-07 (×15): 40 mg via ORAL
  Filled 2013-04-21 (×15): qty 1

## 2013-04-21 MED ORDER — EZETIMIBE 10 MG PO TABS
10.0000 mg | ORAL_TABLET | Freq: Every day | ORAL | Status: DC
  Administered 2013-04-22 – 2013-05-07 (×16): 10 mg via ORAL
  Filled 2013-04-21 (×17): qty 1

## 2013-04-21 MED ORDER — INSULIN ASPART 100 UNIT/ML ~~LOC~~ SOLN: 0.0000 [IU] | Freq: Three times a day (TID) | SUBCUTANEOUS | Status: DC

## 2013-04-21 MED ORDER — RIVAROXABAN 20 MG PO TABS
20.0000 mg | ORAL_TABLET | Freq: Every day | ORAL | Status: DC
  Filled 2013-04-21: qty 1

## 2013-04-21 MED ORDER — INSULIN ASPART 100 UNIT/ML ~~LOC~~ SOLN
0.0000 [IU] | Freq: Three times a day (TID) | SUBCUTANEOUS | Status: DC
  Administered 2013-04-22 – 2013-04-23 (×3): 5 [IU] via SUBCUTANEOUS
  Administered 2013-04-24 (×2): 2 [IU] via SUBCUTANEOUS
  Administered 2013-04-24: 3 [IU] via SUBCUTANEOUS
  Administered 2013-04-25: 5 [IU] via SUBCUTANEOUS
  Administered 2013-04-25 – 2013-04-26 (×5): 3 [IU] via SUBCUTANEOUS
  Administered 2013-04-27: 17:00:00 via SUBCUTANEOUS
  Administered 2013-04-27: 5 [IU] via SUBCUTANEOUS
  Administered 2013-04-27: 2 [IU] via SUBCUTANEOUS
  Administered 2013-04-28: 14:00:00 via SUBCUTANEOUS
  Administered 2013-04-28: 5 [IU] via SUBCUTANEOUS
  Administered 2013-04-28: 2 [IU] via SUBCUTANEOUS
  Administered 2013-04-29: 8 [IU] via SUBCUTANEOUS
  Administered 2013-04-29: 5 [IU] via SUBCUTANEOUS
  Administered 2013-04-29: 3 [IU] via SUBCUTANEOUS
  Administered 2013-04-30 (×2): 5 [IU] via SUBCUTANEOUS
  Administered 2013-04-30: 2 [IU] via SUBCUTANEOUS
  Administered 2013-05-01 (×3): 5 [IU] via SUBCUTANEOUS
  Administered 2013-05-02 (×3): 3 [IU] via SUBCUTANEOUS
  Administered 2013-05-03: 8 [IU] via SUBCUTANEOUS
  Administered 2013-05-03: 17:00:00 via SUBCUTANEOUS
  Administered 2013-05-03 – 2013-05-04 (×2): 5 [IU] via SUBCUTANEOUS
  Administered 2013-05-04: 8 [IU] via SUBCUTANEOUS
  Administered 2013-05-04: 2 [IU] via SUBCUTANEOUS
  Administered 2013-05-05: 5 [IU] via SUBCUTANEOUS
  Administered 2013-05-05 (×2): 2 [IU] via SUBCUTANEOUS
  Administered 2013-05-06: 5 [IU] via SUBCUTANEOUS
  Administered 2013-05-06 – 2013-05-07 (×3): 2 [IU] via SUBCUTANEOUS
  Administered 2013-05-07: 3 [IU] via SUBCUTANEOUS

## 2013-04-21 MED ORDER — GUAIFENESIN-DM 100-10 MG/5ML PO SYRP: 15.0000 mL | ORAL_SOLUTION | ORAL | Status: DC | PRN

## 2013-04-21 NOTE — Discharge Summary (Signed)
Physician Discharge Summary  Leroy [name]:[id number] DOB: Aug 22, 1945 DOA: 04/12/2013  PCP: [name], MD  Admit date: 04/12/2013 Discharge date: 04/21/2013  Time spent: 35 minutes  Recommendations for Outpatient Follow-up:  1. Please followup on blood sugars  Discharge Diagnoses:  Active Problems:   CAD   Type II or unspecified type diabetes mellitus with neurological manifestations, uncontrolled(250.62)   Critical lower limb ischemia   Gangrene of foot   Discharge Condition: Stable  Diet recommendation: Heart healthy/carbohydrate consistent diet  Filed Weights   04/12/13 1919  Weight: 129.4 kg (285 lb 4.4 oz)    History of present illness:  Leroy [name] is a 68 y.o. male patient that underwent a right iliofemoral endarterectomy with bovine patch angioplasty on 03/25/2013 by Dr. Imogene Burn.  He returns today for lower half of right foot worsening pain and worsening gangrene of right great toe.  He states pain in right foot with walking but denies pain in right calf or thigh with walking, denies pain in left thigh with walking, denies problem with left BKA stump, uses his prosthesis.  He is diabetic with labile glycemic control as he reports, he does not smoke, he does not have CRF.  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  Hospital Course:  Patient is a pleasant 68 year old gentleman with a past medical history of poorly controlled type 2 diabetes mellitus having hemoglobin A1c of 12.4, medication nonadherence, history of left below the knee amputation, presented with right lower extremity critical limb ischemia and wounds. He had a recent angiogram which showed greater than 90% calcified distal common femoral artery stenosis that was compromising femoral blood flow. Given development of ischemic limb he was taken to the OR where he underwent right below the knee amputation on 04/14/2013. Patient tolerated procedure well there are no immediate complications. He was treated with  broad-spectrum empiric IV antibiotic therapy with vancomycin and aztreonam. Cultures that were drawn on 04/13/2013 remained negative x2 sets. His hospitalization was complicated by the development of hypoglycemia which occurred after procedure. His insulin when she was brought down from 50 units subcutaneous twice a day 245 units subcutaneous twice a day. Despite this he continued to have episodes of hypoglycemia for which today I have brought down his Lantus dose further to 40 units subcutaneous twice a day. Patient otherwise has remained stable, afebrile for greater than 48 hours, tolerating by mouth intake. Physical therapy recommended inpatient rehabilitation, where he was accepted on 04/21/2013. Prior to discharge I paged Dr. Imogene Burn of vascular surgery who agreed with plan.  Procedures:  Right Below the knee amputation 04/14/13  Consultations:  Vascular surgery  Discharge Exam: Filed Vitals:   04/21/13 0413  BP: 112/71  Pulse: 83  Temp: 99.5 F (37.5 C)  Resp: 18   Discharge Instructions  Discharge Orders   Future Orders Complete By Expires   Call MD for:  difficulty breathing, headache or visual disturbances  As directed    Call MD for:  persistant nausea and vomiting  As directed    Call MD for:  redness, tenderness, or signs of infection (pain, swelling, redness, odor or green/yellow discharge around incision site)  As directed    Call MD for:  severe uncontrolled pain  As directed    Call MD for:  temperature >100.4  As directed    Diet - low sodium heart healthy  As directed    Increase activity slowly  As directed        Medication List  STOP taking these medications       insulin lispro 100 UNIT/ML injection  Commonly known as:  HUMALOG     KOMBIGLYZE XR 2.08-998 MG Tb24  Generic drug:  Saxagliptin-Metformin     oxyCODONE-acetaminophen 5-325 MG per tablet  Commonly known as:  PERCOCET/ROXICET      TAKE these medications       acetaminophen 325 MG tablet   Commonly known as:  TYLENOL  Take 2 tablets (650 mg total) by mouth every 6 (six) hours as needed for mild pain or fever.     ezetimibe 10 MG tablet  Commonly known as:  ZETIA  Take 10 mg by mouth daily.     insulin aspart 100 UNIT/ML injection  Commonly known as:  novoLOG  Inject 0-15 Units into the skin 3 (three) times daily with meals.     insulin aspart 100 UNIT/ML injection  Commonly known as:  novoLOG  Inject 0-5 Units into the skin at bedtime.     insulin glargine 100 UNIT/ML injection  Commonly known as:  LANTUS  Inject 0.4 mLs (40 Units total) into the skin 2 (two) times daily.     lisinopril 5 MG tablet  Commonly known as:  PRINIVIL,ZESTRIL  Take 1 tablet (5 mg total) by mouth daily.     metoprolol succinate 50 MG 24 hr tablet  Commonly known as:  TOPROL-XL  Take 1 tablet (50 mg total) by mouth 2 (two) times daily. Take with or immediately following a meal.     nitroGLYCERIN 0.4 MG SL tablet  Commonly known as:  NITROSTAT  Place 0.4 mg under the tongue every 5 (five) minutes as needed for chest pain.     oxyCODONE 5 MG immediate release tablet  Commonly known as:  Oxy IR/ROXICODONE  Take 1-2 tablets (5-10 mg total) by mouth every 4 (four) hours as needed for moderate pain.     pantoprazole 40 MG tablet  Commonly known as:  PROTONIX  Take 1 tablet (40 mg total) by mouth daily.     Rivaroxaban 20 MG Tabs tablet  Commonly known as:  XARELTO  Take 1 tablet (20 mg total) by mouth daily at 6 PM.       Allergies  Allergen Reactions  . Statins     Muscle aches  . Amlodipine Besylate     Muscle aches making difficult to walk  . Cephalexin     REACTION: Hives       Follow-up Information   Follow up In 2 weeks.      Follow up with Nilda Simmer, MD In 1 week.   Specialty:  Vascular Surgery   Contact information:   [address] [place] [address] [phone]        The results of significant diagnostics from this hospitalization  (including imaging, microbiology, ancillary and laboratory) are listed below for reference.    Significant Diagnostic Studies: Dg Chest Port 1 View  04/16/2013   CLINICAL DATA:  68 year old male with shortness of Breath status post lower extremity amputation. High-grade fever. Initial encounter.  EXAM: PORTABLE CHEST - 1 VIEW  COMPARISON:  08/04/2012 and earlier.  FINDINGS: Portable AP upright view at 0809 hr. Right side PICC line in place, tip at the level of the lower SVC slightly below the carina. Combined patchy in curvilinear opacity at the right lung base, most resembles atelectasis. No pneumothorax, pulmonary edema, pleural effusion or definite consolidation. Normal cardiac size and mediastinal contours. Sequelae of median sternotomy.  IMPRESSION:  1. Opacity at the medial right lung base which most resembles atelectasis. 2. Right PICC line in place.   Electronically Signed   By: Augusto Gamble M.D.   On: 04/16/2013 08:26    Microbiology: Recent Results (from the past 240 hour(s))  CULTURE, BLOOD (ROUTINE X 2)     Status: None   Collection Time    04/13/13  9:35 PM      Result Value Range Status   Specimen Description BLOOD LEFT ARM   Final   Special Requests BOTTLES DRAWN AEROBIC ONLY 10CC   Final   Culture  Setup Time     Final   Value: 04/14/2013 01:48     Performed at [facility]   Culture     Final   Value: NO GROWTH 5 DAYS     Performed at [facility]   Report Status 04/20/2013 FINAL   Final  CULTURE, BLOOD (ROUTINE X 2)     Status: None   Collection Time    04/13/13  9:40 PM      Result Value Range Status   Specimen Description BLOOD LEFT HAND   Final   Special Requests BOTTLES DRAWN AEROBIC ONLY 4CC   Final   Culture  Setup Time     Final   Value: 04/14/2013 01:50     Performed at [facility]   Culture     Final   Value: NO GROWTH 5 DAYS     Performed at [facility]   Report Status 04/20/2013 FINAL   Final  MRSA PCR SCREENING     Status:  None   Collection Time    04/14/13  1:39 PM      Result Value Range Status   MRSA by PCR NEGATIVE  NEGATIVE Final   Comment:            The GeneXpert MRSA Assay (FDA     approved for NASAL specimens     only), is one component of a     comprehensive MRSA colonization     surveillance program. It is not     intended to diagnose MRSA     infection nor to guide or     monitor treatment for     MRSA infections.  URINE CULTURE     Status: None   Collection Time    04/16/13  5:18 PM      Result Value Range Status   Specimen Description URINE, RANDOM   Final   Special Requests Normal   Final   Culture  Setup Time     Final   Value: 04/17/2013 00:29     Performed at [facility]   Colony Count     Final   Value: NO GROWTH     Performed at [facility]   Culture     Final   Value: NO GROWTH     Performed at [facility]   Report Status 04/18/2013 FINAL   Final     Labs: Basic Metabolic Panel:  Recent Labs Lab 04/15/13 0510 04/19/13 0525  NA 133* 136*  K 4.8 3.8  CL 96 98  CO2 26 26  GLUCOSE 209* 65*  BUN 20 15  CREATININE 1.33 0.96  CALCIUM 8.7 8.3*   Liver Function Tests: No results found for this basename: AST, ALT, ALKPHOS, BILITOT, PROT, ALBUMIN,  in the last 168 hours No results found for this basename: LIPASE, AMYLASE,  in the last  168 hours No results found for this basename: AMMONIA,  in the last 168 hours CBC:  Recent Labs Lab 04/15/13 0510 04/17/13 0500 04/19/13 0525 04/20/13 1500  WBC 12.4* 14.6* 9.4 10.2  NEUTROABS 8.9*  --   --   --   HGB 12.4* 11.0* 9.9* 10.8*  HCT 37.0* 32.2* 29.4* 32.2*  MCV 90.0 88.5 87.8 89.4  PLT 277 253 268 309   Cardiac Enzymes: No results found for this basename: CKTOTAL, CKMB, CKMBINDEX, TROPONINI,  in the last 168 hours BNP: BNP (last 3 results)  Recent Labs  08/04/12 1232  PROBNP 688.1*   CBG:  Recent Labs Lab 04/20/13 2120 04/21/13 0558 04/21/13 0624 04/21/13 0808  04/21/13 1124  GLUCAP 113* 57* 77 110* 158*       Signed:  Lamyiah Crawshaw  [facility] 04/21/2013, 1:50 PM

## 2013-04-21 NOTE — Progress Notes (Addendum)
[facility] Progress Note  04/21/2013 8:11 AM POD 7  Subjective:  States his leg feels funny today; still having pain, but rarely asking for pain medication  Tm 99.5 this am  100's-110's systolic HR 80's regular 96% RA Filed Vitals:   04/21/13 0413  BP: 112/71  Pulse: 83  Temp: 99.5 F (37.5 C)  Resp: 18    Physical Exam: Incisions:  Right groin with wound vac in place with good seal;  Right BKA incision is in tact with staples.  There is some bloody drainage on the bandage, but I am unable to express anything from the wound. Extremities:  Good movement of RLE  CBC    Component Value Date/Time   WBC 10.2 04/20/2013 1500   RBC 3.60* 04/20/2013 1500   HGB 10.8* 04/20/2013 1500   HCT 32.2* 04/20/2013 1500   PLT 309 04/20/2013 1500   MCV 89.4 04/20/2013 1500   MCH 30.0 04/20/2013 1500   MCHC 33.5 04/20/2013 1500   RDW 12.8 04/20/2013 1500   LYMPHSABS 1.9 04/15/2013 0510   MONOABS 1.5* 04/15/2013 0510   EOSABS 0.1 04/15/2013 0510   BASOSABS 0.0 04/15/2013 0510    BMET    Component Value Date/Time   NA 136* 04/19/2013 0525   K 3.8 04/19/2013 0525   CL 98 04/19/2013 0525   CO2 26 04/19/2013 0525   GLUCOSE 65* 04/19/2013 0525   BUN 15 04/19/2013 0525   CREATININE 0.96 04/19/2013 0525   CALCIUM 8.3* 04/19/2013 0525   GFRNONAA 84* 04/19/2013 0525   GFRAA >90 04/19/2013 0525    INR    Component Value Date/Time   INR 1.17 04/12/2013 2155   INR 1.6 04/25/2010 0904     Intake/Output Summary (Last 24 hours) at 04/21/13 0811 Last data filed at 04/21/13 0418  Gross per 24 hour  Intake    720 ml  Output   1625 ml  Net   -905 ml     Assessment/Plan:  68 y.o. male is s/p right below knee amputation  POD 7  -hypoglycemic this am with CBG of 57.  Pt hypoglycemic in am x 2 days.  DM coordinator recommends leaving am dose of Lantus 45 units and further decreasing evening does of Lantus to 43 units.  Will defer to IM for management of DM. -minimal bloody drainage on bandage  of right BKA and pt states bandage had not been changed since yesterday in the am; unable to express any fluid from the wound. -pt on Xarelto for PAF -hgb improved from yesterday -blood cultures negative x 2 Awaiting rehab options-CIR recommended by PT  Doreatha Massed, PA-C [facility] [phone] 04/21/2013 8:11 AM  Addendum  I have independently interviewed and examined the patient, and I agree with the physician assistant's findings.  Ok to D/C abx after today.  Continue VAC dressing to right groin.  Sterile dressing daily to right BKA.  [name], MD [facility] [place] Office: [phone] Pager: [phone]  04/21/2013, 3:21 PM

## 2013-04-21 NOTE — PMR Pre-admission (Signed)
PMR Admission Coordinator Pre-Admission Assessment  Patient: Leroy [name] is an 68 y.o., male MRN: [id number] DOB: [birth year] Height: 5\' 9"  (175.3 cm) Weight: 129.4 kg (285 lb 4.4 oz)              Insurance Information HMO: yes    PPO:      PCP:      IPA:      80/20:      OTHER: Silverback [employer] plan PRIMARY: [employer] Medicare      Policy#: [id number]      Subscriber: pt CM Name: Leron Croak      Phone#: [phone]     Fax#: [phone] Pre-Cert#: [id number]      Employer: disabled Benefits:  Phone #: [phone]     Name: 04/19/13 Eff. Date: 05/09/12     Deduct: none      Out of Pocket Max: $4900      Life Max: none CIR: $245 per day, days 1-7      SNF: no copay days 1-20; $156 per day days 21-100 Outpatient: $10 copay per visit     Co-Pay: no visit limit Home Health: 100%      Co-Pay: no visit limit DME: 80%     Co-Pay: 20% Providers: in network  SECONDARY: none       Medicaid Application Date:       Case Manager:  Disability Application Date:       Case Worker:   Emergency Conservator, museum/gallery Information   Name Relation Home Work Mobile   [place]    [phone]     Current Medical History  Patient Admitting Diagnosis: 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   Admitted 04/12/2013 with right foot gangrenous changes as well as recent right iliofemoral endarterectomy with poor healing. Limb was not felt to be salvageable and underwent right below-knee dictation as well as superficial right groin dehiscence with debridement 04/14/2013 per Dr. Imogene Burn. Postoperative pain management as well as postoperative VAC to  right groin area. Hemoglobin A1c 12.4 with insulin therapy as directed.   Past Medical History  Past Medical History  Diagnosis Date  . Hyperlipidemia   . Ischemic cardiomyopathy     a. EF 40% 2010. b. Echo (2/14):  mild LVH, EF 30-35%, diff HK, Gr 1 DD, MAC, mild MR, mod LAE, PASP 39  . Cataract   . Hypotension   . Paroxysmal atrial fibrillation   . Peripheral vascular disease     a. s/p L BKA 2014.  [name] History of blood clots     in legs--this was in 2011--has been off of Coumadin 53months;takes Xarelto daily  . Peripheral neuropathy   . Joint pain   . H/O hiatal hernia   . Chronic kidney disease     on Lisinopril to protect kidneys d/t being on Metformin per pt  . Myocardial infarction     total of 8 heart attacks;last one in 2011  . Atrial flutter   . H/O medication noncompliance     Due to insurance issues  . Chronic systolic CHF (congestive heart failure)   . Coronary artery disease     a. s/p remote CABG  2001 at [facility] b. Cath 03/2010: for med rx.;  c.  [name] myoview (1/14):  Large Inferior apical MI, very small area of peri-infarct ischemia toward the inferior apical segment. EF 19%.  Med Rx was continued.    . Diabetes mellitus 1979    takes Metformni daily  and Lantus 50units bid    Family History  family history includes Diabetes in his mother; Heart attack in his mother; Heart disease in his father and mother.  Prior Rehab/Hospitalizations: SNF after first BKA   Current Medications  Current facility-administered medications:acetaminophen (TYLENOL) tablet 650 mg, 650 mg, Oral, Q6H PRN, Nada Libman, MD, 650 mg at 04/16/13 0534;  ezetimibe (ZETIA) tablet 10 mg, 10 mg, Oral, Daily, [name], MD, 10 mg at 04/21/13 1121;  guaiFENesin-dextromethorphan (ROBITUSSIN DM) 100-10 MG/5ML syrup 15 mL, 15 mL, Oral, Q4H PRN, [name], NP hydrALAZINE (APRESOLINE) injection 10 mg, 10 mg, Intravenous, Q2H PRN, [name], NP;  insulin aspart (novoLOG) injection  0-15 Units, 0-15 Units, Subcutaneous, TID WC, [name], PA-C, 3 Units at 04/21/13 1302;  insulin aspart (novoLOG) injection 0-5 Units, 0-5 Units, Subcutaneous, QHS, [name], PA-C, 2 Units at 04/18/13 2158;  insulin glargine (LANTUS) injection 40 Units, 40 Units, Subcutaneous, BID, Jeralyn Bennett, MD labetalol (NORMODYNE,TRANDATE) injection 10 mg, 10 mg, Intravenous, Q2H PRN, [name], NP;  lactated ringers infusion, , Intravenous, Continuous, Arta Bruce, MD, Last Rate: 50 mL/hr at 04/14/13 1420;  lisinopril (PRINIVIL,ZESTRIL) tablet 5 mg, 5 mg, Oral, Daily, [name], PA-C, 5 mg at 04/21/13 1121;  metoprolol (LOPRESSOR) injection 2-5 mg, 2-5 mg, Intravenous, Q2H PRN, [name], NP, 5 mg at 04/14/13 1957 metoprolol succinate (TOPROL-XL) 24 hr tablet 50 mg, 50 mg, Oral, BID, Emma M Collins, PA-C, 50 mg at 04/21/13 1122;  morphine 2 MG/ML injection 2-5 mg, 2-5 mg, Intravenous, Q4H PRN, [name], NP, 4 mg at 04/21/13 1022;  nitroGLYCERIN (NITROSTAT) SL tablet 0.4 mg, 0.4 mg, Sublingual, Q5 min PRN, [name], MD;  ondansetron [facility]) injection 4 mg, 4 mg, Intravenous, Q6H PRN, [name], NP oxyCODONE (Oxy IR/ROXICODONE) immediate release tablet 5-10 mg, 5-10 mg, Oral, Q4H PRN, [name], MD, 10 mg at 04/21/13 1023;  pantoprazole (PROTONIX) EC tablet 40 mg, 40 mg, Oral, Daily, [name], NP, 40 mg at 04/21/13 1121;  phenol (CHLORASEPTIC) mouth spray 1 spray, 1 spray, Mouth/Throat, PRN, [name], NP;  Rivaroxaban (XARELTO) tablet 20 mg, 20 mg, Oral, q1800, [name], MD, 20 mg at 04/20/13 1730 sodium chloride 0.9 % injection 10-40 mL, 10-40 mL, Intracatheter, PRN, [name], MD, 10 mL at 04/19/13 0525;  sodium chloride 0.9 % injection 10-40 mL, 10-40 mL, Intracatheter, Q12H, [name], MD;  sodium chloride 0.9 % injection 10-40 mL, 10-40 mL, Intracatheter, PRN, [name], MD;  sodium chloride 0.9 % injection 3 mL, 3 mL, Intravenous,  Q12H, [name], NP, 3 mL at 04/19/13 1000  Patients Current Diet: Carb Control  Precautions / Restrictions Precautions Precautions: Fall Precaution Comments: reports falling at home 3-4 times in 6 months Restrictions Weight Bearing Restrictions: No   Prior Activity Level Community (5-7x/wk): active and 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 Used prosthesis and Dispensing optician / Equipment Home Assistive Devices/Equipment: Prosthesis;CBG Meter Home Equipment: Environmental consultant - 2 wheels;Bedside commode;Shower seat;Grab bars - tub/shower;Wheelchair - manual;Grab bars - toilet  Prior Functional Level Prior Function Level of Independence: Independent  with assistive device(s) Comments: amb with prosthesis and walker  Current Functional Level Cognition  Overall Cognitive Status: Within Functional Limits for tasks assessed Orientation Level: Oriented X4    Extremity Assessment (includes Sensation/Coordination)          ADLs  Eating/Feeding: Simulated;Modified independent Where Assessed - Eating/Feeding: Bed level Grooming: Performed;Wash/dry hands;Wash/dry face;Teeth care;Set up Where Assessed - Grooming: Supported sitting Upper Body Bathing: Performed;Chest;Right arm;Left arm;Abdomen;Min guard Where Assessed - Upper Body Bathing: Supported sitting Lower Body Bathing: Performed;Moderate assistance Where Assessed - Lower Body Bathing: Rolling right and/or left;Lean right and/or left;Supine, head of bed flat Upper Body Dressing: Performed;Min guard Where Assessed - Upper Body Dressing: Supported sitting Lower Body Dressing: Simulated;Maximal assistance Where Assessed - Lower Body Dressing: Lean right and/or left;Rolling right and/or left Toilet Transfer: Simulated;+2 Total assistance Toilet Transfer Method: Scientist, product/process development: Extra wide drop arm bedside commode Toileting - Clothing Manipulation and Hygiene: Simulated;+2 Total  assistance Where Assessed - Engineer, mining and Hygiene: Lean right and/or left Tub/Shower Transfer Method: Not assessed Transfers/Ambulation Related to ADLs: Pt performed bed mobility today w/ +1 assist.  ADL Comments: Pt was educated in role of OT and recommendations for in-pt Rehab/CIR. Pt verbalized understanding of this. Pt participated in ADL retraining session given frequent rest breaks and supported sitting/Min guard assist throughout. Pt requires increased time for all tasks. Will follow acutely.    Mobility       Transfers       Ambulation / Gait / Stairs / Wheelchair Mobility  Ambulation/Gait General Gait Details: unable to [employer] BKA prosthesis to step/hop with RW    Posture / Balance Dynamic Sitting Balance Dynamic Sitting - Balance Support: During functional activity;Left upper extremity supported;Feet supported;Right upper extremity supported Dynamic Sitting - Level of Assistance: 4: Min assist;5: Stand by assistance Dynamic Sitting - Balance Activities: Lateral lean/weight shifting;Reaching across midline;Forward lean/weight shifting Sitting balance - Comments: Sat EOB to don Lt prosthesis; able to reach forward with bil UEs to end of Lt BKA residual limb, however with hesitancy and minguard assist as pt learns to balance without LE support on floor (and pt very "top heavy" due to obesity and UE strength)    Special needs/care consideration Wound Vac (area) r groin place during surgery     Bowel mgmt: continent Bladder mgmt:continent Diabetic mgmt yes; poor compliance   Previous Home Environment Living Arrangements: Children  Lives With: Other (Comment) (dtr and her husband live with pt/rental home) Available Help at Discharge: Available 24 hours/day;Friend(s) (dtr and her husband were available 24/7) Type of Home: House Home Layout: One level Home Access: Ramped entrance Bathroom Shower/Tub: Tub/shower unit;Curtain Bathroom Toilet:  Standard Bathroom Accessibility: No (he would do w/c to door of bathroom, move to another chair a) Home Care Services: No Additional Comments: tub shower  Discharge Living Setting Plans for Discharge Living Setting: Patient's home;Lives with (comment);Other (Comment) (pt's dtr and her husband moving out 1/16 as well as pt's eld) Type of Home at Discharge: House Discharge Home Layout: One level Discharge Home Access: Ramped entrance Discharge Bathroom Shower/Tub: Tub/shower unit;Curtain Discharge Bathroom Toilet: Standard Discharge Bathroom Accessibility: No Does the patient have any problems obtaining your medications?: No  Social/Family/Support Systems Patient Roles: Partner;Parent Contact Information: Ranae Pila Anticipated Caregiver: Alinda Money and his friend Selena Batten who likely will be moving in withpt Anticipated Caregiver's Contact Information: cell for Alinda Money [phone] Ability/Limitations of Caregiver: Alinda Money works; Selena Batten is an exgirlfriend who he plan to let move back in  again. She is a OceanographerCNA Caregiver Availability: Intermittent Discharge Plan Discussed with Primary Caregiver: No Is Caregiver In Agreement with Plan?: No (I discussed with pt and he is discussing with Selena BattenKim this upcom) Does Caregiver/Family have Issues with Lodging/Transportation while Pt is in Rehab?: No    Goals/Additional Needs Patient/Family Goal for Rehab: supervision with ambulation out of w/c, supervision with adls. Likely w/c level at d/c Expected length of stay: ELOS 7 to 10 days Pt/Family Agrees to Admission and willing to participate: Yes Program Orientation Provided & Reviewed with Pt/Caregiver Including Roles  & Responsibilities: Yes Additional Information Needs: Patient has a new [employer] Medicare policy "silverback" which is new 2015 for some [employer] clients   Decrease burden of Care through IP rehab admission: n/a  Possible need for SNF placement upon discharge: no  Patient Condition: This patient's medical and  functional status has changed since the consult dated: 04/15/13 in which the Rehabilitation Physician determined and documented that the patient's condition is appropriate for intensive rehabilitative care in an inpatient rehabilitation facility. See "History of Present Illness" (above) for medical update. Functional changes are:  Overall max [facility] for transfers with LLE prosthesis. Patient's medical and functional status update has been discussed with the Rehabilitation physician and patient remains appropriate for inpatient rehabilitation. Will admit to inpatient rehab today.  Preadmission Screen Completed By:  Clois DupesBoyette, Kazden Largo Godwin, 04/21/2013 4:15 PM ______________________________________________________________________   Discussed status with Dr.  Riley KillSwartz on 04/21/13 at  1614 and received telephone approval for admission today.  Admission Coordinator:  Clois DupesBoyette, Ichelle Harral Godwin, time [phone] Date 04/21/13.

## 2013-04-21 NOTE — Progress Notes (Signed)
[name]  Leroy [name] ZOX:[id number] DOB: 30-Jan-1946 DOA: 04/12/2013 PCP: [name], MD  Assessment/Plan: 1. Right critical limb ischemia, recent angiogram showing 90% calcified distal common femoral artery stenosis. Status post right below the knee amputation on 04/14/2013. Patient's vancomycin and aztreonam to be discontinued today as blood cultures have remained negative. He is presently afebrile. His wound was evaluated, no evidence of infection.  2. Type 2 diabetes mellitus, insulin-dependent, poorly controlled with hemoglobin A1c of 12.4. Likely secondary to medication nonadherence. He continues to have hypoglycemia having a blood sugar of 57 at 6 AM. Will decrease his Lantus dose from 45 units twice a day to 35 units subcutaneous twice a day. Continue Accu-Cheks q. a.c. and each bedtime with sliding scale coverage 3. Chronic anemia. Hemoglobin stable, with his most recent hemoglobin of 10.8 on 04/20/2013. 4. Coronary artery disease. Stable, no active issues. 5. History of paroxysmal atrial fibrillation. Continue beta blocker therapy with metoprolol 50 mg by mouth twice a day and anticoagulation with Xarelto.   Code Status: Full Code Disposition Plan: Transfer to CIR vs SNF   Consultants:  Vascular surgery  Procedures:  Right below the knee amputation performed on 04/14/2013   HPI/Subjective: Patient is a pleasant 68 year old woman with a past medical history of diabetes mellitus, status post below the knee amputation 2 left lower extremity, presented with right lower extremity critical limb ischemia with wounds, recent angiogram showing greater than 90% calcified distal common femoral artery stenosis compromising femoral blood flow. He underwent right below the knee amputation on 04/14/2013. Patient's hospitalization complicated by poorly controlled diabetes mellitus, expressing hypoglycemia after right BKA.   Objective: Filed Vitals:   04/21/13 0413   BP: 112/71  Pulse: 83  Temp: 99.5 F (37.5 C)  Resp: 18    Intake/Output Summary (Last 24 hours) at 04/21/13 1307 Last data filed at 04/21/13 1030  Gross per 24 hour  Intake   4420 ml  Output   1825 ml  Net   2595 ml   Filed Weights   04/12/13 1919  Weight: 129.4 kg (285 lb 4.4 oz)    Exam:   General:  Patient is in no acute distress awake alert oriented  Cardiovascular: Regular rate rhythm normal S1  Respiratory: Lungs clear to auscultation bilaterally  Abdomen: Soft nontender nondistended  Musculoskeletal: Status post bilateral below the knee dictations, right stump healing well, no evidence of infection or purulence.  Data Reviewed: Basic Metabolic Panel:  Recent Labs Lab 04/15/13 0510 04/19/13 0525  NA 133* 136*  K 4.8 3.8  CL 96 98  CO2 26 26  GLUCOSE 209* 65*  BUN 20 15  CREATININE 1.33 0.96  CALCIUM 8.7 8.3*   Liver Function Tests: No results found for this basename: AST, ALT, ALKPHOS, BILITOT, PROT, ALBUMIN,  in the last 168 hours No results found for this basename: LIPASE, AMYLASE,  in the last 168 hours No results found for this basename: AMMONIA,  in the last 168 hours CBC:  Recent Labs Lab 04/15/13 0510 04/17/13 0500 04/19/13 0525 04/20/13 1500  WBC 12.4* 14.6* 9.4 10.2  NEUTROABS 8.9*  --   --   --   HGB 12.4* 11.0* 9.9* 10.8*  HCT 37.0* 32.2* 29.4* 32.2*  MCV 90.0 88.5 87.8 89.4  PLT 277 253 268 309   Cardiac Enzymes: No results found for this basename: CKTOTAL, CKMB, CKMBINDEX, TROPONINI,  in the last 168 hours BNP (last 3 results)  Recent Labs  08/04/12 1232  PROBNP  688.1*   CBG:  Recent Labs Lab 04/20/13 2120 04/21/13 0558 04/21/13 0624 04/21/13 0808 04/21/13 1124  GLUCAP 113* 57* 77 110* 158*    Recent Results (from the past 240 hour(s))  CULTURE, BLOOD (ROUTINE X 2)     Status: None   Collection Time    04/13/13  9:35 PM      Result Value Range Status   Specimen Description BLOOD LEFT ARM   Final    Special Requests BOTTLES DRAWN AEROBIC ONLY 10CC   Final   Culture  Setup Time     Final   Value: 04/14/2013 01:48     Performed at [facility]   Culture     Final   Value: NO GROWTH 5 DAYS     Performed at [facility]   Report Status 04/20/2013 FINAL   Final  CULTURE, BLOOD (ROUTINE X 2)     Status: None   Collection Time    04/13/13  9:40 PM      Result Value Range Status   Specimen Description BLOOD LEFT HAND   Final   Special Requests BOTTLES DRAWN AEROBIC ONLY 4CC   Final   Culture  Setup Time     Final   Value: 04/14/2013 01:50     Performed at [facility]   Culture     Final   Value: NO GROWTH 5 DAYS     Performed at [facility]   Report Status 04/20/2013 FINAL   Final  MRSA PCR SCREENING     Status: None   Collection Time    04/14/13  1:39 PM      Result Value Range Status   MRSA by PCR NEGATIVE  NEGATIVE Final   Comment:            The GeneXpert MRSA Assay (FDA     approved for NASAL specimens     only), is one component of a     comprehensive MRSA colonization     surveillance program. It is not     intended to diagnose MRSA     infection nor to guide or     monitor treatment for     MRSA infections.  URINE CULTURE     Status: None   Collection Time    04/16/13  5:18 PM      Result Value Range Status   Specimen Description URINE, RANDOM   Final   Special Requests Normal   Final   Culture  Setup Time     Final   Value: 04/17/2013 00:29     Performed at [facility]   Colony Count     Final   Value: NO GROWTH     Performed at [facility]   Culture     Final   Value: NO GROWTH     Performed at [facility]   Report Status 04/18/2013 FINAL   Final     Studies: No results found.  Scheduled Meds: . ezetimibe  10 mg Oral Daily  . insulin aspart  0-15 Units Subcutaneous TID WC  . insulin aspart  0-5 Units Subcutaneous QHS  . insulin glargine  45 Units Subcutaneous BID  . lisinopril  5 mg Oral  Daily  . metoprolol succinate  50 mg Oral BID  . pantoprazole  40 mg Oral Daily  . rivaroxaban  20 mg Oral q1800  . sodium chloride  10-40 mL Intracatheter Q12H  . sodium chloride  3 mL Intravenous Q12H   Continuous Infusions: . lactated ringers 50 mL/hr at 04/14/13 1420    Active Problems:   CAD   Type II or unspecified type diabetes mellitus with neurological manifestations, uncontrolled(250.62)   Critical lower limb ischemia   Gangrene of foot    Time spent: 35 minutes    Jeralyn Bennett  [facility] Pager [phone]. If 7PM-7AM, please contact night-coverage at [email or web address], password [facility] 04/21/2013, 1:07 PM  LOS: 9 days

## 2013-04-21 NOTE — Progress Notes (Signed)
Per RN, patient received insurance auth for CIR today. CSW is signing off at this time. Please re consult if social work needs arise  Maree Krabbe, MSW, [place] [phone]

## 2013-04-21 NOTE — Progress Notes (Signed)
Blood noted to be on sheets.  Some drainage on gauze wrap noted on Right BKA.  Drainage marked and will continue to monitor closely and will report to day RN.  Call light within reach.

## 2013-04-21 NOTE — Significant Event (Signed)
1840pm-Patient has been safely transferred to 4W, via bed. VS stable. Wound vac was changed prior to transferring. VS stable prior and during the transfer. All belongings (clothes, black cellphone, charger, and left leg prothesis) are all taken to his new room and are accounted for by the patient. Patient's family aware of the transfer. Report given to receiving RN Whitney. Deunta Beneke, Charity fundraiser.

## 2013-04-21 NOTE — Progress Notes (Signed)
Patient ID: Leroy [name], male   DOB: 1945/06/02, 68 y.o.   MRN: [id number] Patient admitted to room 4W01 via bed, escorted by nursing staff.  Patient verbalized understanding of rehab process, no questions asked; reviewed and signed fall policy.  Appears to be in no immediate distress at this time. Dani Gobble, RN

## 2013-04-21 NOTE — Progress Notes (Signed)
Physical Therapy Treatment Patient Details Name: Leroy [name] MRN: [id number] DOB: 01-18-1946 Today's Date: 04/21/2013 Time: 8546-2703 PT Time Calculation (min): 64 min  PT Assessment / Plan / Recommendation  History of Present Illness Pt s/p rt BKA and debridement of Rt groin wound (dehisced s/p iliofemoral endarterectomy 03/2013) with VAC to Rt groin. Pt with h/o lt BKA, DM, PVD, neuropathy, CAD   PT Comments   Pt delighted to get OOB and into chair using Sara-Plus lift (standing on footplate with Lt BKA prosthesis). Able to safely perform chair pushups with foot rest elevated (to assist with edema control of Rt BKA). Continues to progress.   Follow Up Recommendations  CIR     Does the patient have the potential to tolerate intense rehabilitation     Barriers to Discharge        Equipment Recommendations  None recommended by PT    Recommendations for Other Services    Frequency Min 4X/week   Progress towards PT Goals Progress towards PT goals: Progressing toward goals  Plan Current plan remains appropriate    Precautions / Restrictions Precautions Precautions: Fall   Pertinent Vitals/Pain RN provided medication to assist with pain control; pt reporting Rt BKA pain (especially phantom pain) on arrival; improved after pain medicine via IV and po    Mobility  Bed Mobility Overal bed mobility: Needs Assistance Bed Mobility: Rolling;Sidelying to Sit Rolling: Supervision Sidelying to sit: Min guard;HOB elevated General bed mobility comments: slow processsing and continues to require cues for technique; close minguard assist to sit EOB due to very close to edge (pt did not lose balance) Transfers Overall transfer level: Needs assistance Equipment used:  (Sara-Plus) Transfers: Sit to/from [employer] transfer comment: stood x 2 with Sara-Plus lift (using footplate to allow pivoting to recliner)    Exercises Amputee Exercises Chair Push Up: AROM;5  reps;Seated (in recliner)   PT Diagnosis:    PT Problem List:   PT Treatment Interventions:     PT Goals (current goals can now be found in the care plan section)    Visit Information  Last PT Received On: 04/21/13 Assistance Needed: +2 History of Present Illness: Pt s/p rt BKA and debridement of Rt groin wound (dehisced s/p iliofemoral endarterectomy 03/2013) with VAC to Rt groin. Pt with h/o lt BKA, DM, PVD, neuropathy, CAD    Subjective Data      Cognition  Cognition Arousal/Alertness: Awake/alert Behavior During Therapy: WFL for tasks assessed/performed Overall Cognitive Status: Within Functional Limits for tasks assessed    Balance  Balance Sitting-balance support: No upper extremity supported (bil BKA without prosthesis) Sitting balance-Leahy Scale: Good Sitting balance - Comments: Sat EOB to don Lt prosthesis; able to reach forward with bil UEs to end of Lt BKA residual limb, however with hesitancy and minguard assist as pt learns to balance without LE support on floor (and pt very "top heavy" due to obesity and UE strength)  End of Session PT - End of Session Activity Tolerance: Patient tolerated treatment well Patient left: in chair;with call bell/phone within reach Nurse Communication: Mobility status;Need for lift equipment   GP     Curlee Bogan 04/21/2013, 12:02 PM Pager [phone]

## 2013-04-21 NOTE — Progress Notes (Signed)
[employer] insurance has approved an inpt rehab admission for today. Patient is in agreement. I have notified Dr. Vanessa  and he is in agreement. I will arrange. [phone]

## 2013-04-21 NOTE — H&P (Signed)
Physical Medicine and Rehabilitation Admission H&P   Chief complaint: Right leg pain    HPI: Leroy [name] is a 68 y.o. 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 Patient did not receive inpatient rehabilitation services secondary to insurance issues/denial per [facility]. Admitted 04/12/2013 with right foot gangrenous changes as well as recent right iliofemoral endarterectomy with poor healing. Limb was not felt to be salvageable and underwent right below-knee amputation as well as superficial right groin dehiscence with debridement 04/14/2013 per Dr. Imogene Burn. Postoperative pain management as well as postoperative VAC. Hemoglobin A1c 12.4 with insulin therapy as directed. Patient remains on Xarelto for PAF. Physical and occupational therapy evaluations completed ongoing. M.D.has requested physical medicine rehabilitation consult to consider inpatient rehabilitation services. Patient was felt to be a good candidate for inpatient rehabilitation services and was admitted for comprehensive rehabilitation program    ROS Review of Systems  Cardiovascular: Positive for palpitations and leg swelling.  Musculoskeletal: Positive for joint pain.  All other systems reviewed and are negative  Past Medical History   Diagnosis  Date   .  Hyperlipidemia    .  Ischemic cardiomyopathy      a. EF 40% 2010. b. Echo (2/14): mild LVH, EF 30-35%, diff HK, Gr 1 DD, MAC, mild MR, mod LAE, PASP 39   .  Cataract    .  Hypotension    .  Paroxysmal atrial fibrillation    .  Peripheral vascular disease      a. s/p L BKA 2014.   [name]  History of blood clots      in legs--this was in 2011--has been off of Coumadin 37months;takes Xarelto daily   .  Peripheral neuropathy    .  Joint pain     .  H/O hiatal hernia    .  Chronic kidney disease      on Lisinopril to protect kidneys d/t being on Metformin per pt   .  Myocardial infarction      total of 8 heart attacks;last one in 2011   .  Atrial flutter    .  H/O medication noncompliance      Due to insurance issues   .  Chronic systolic CHF (congestive heart failure)    .  Coronary artery disease      a. s/p remote CABG 2001 at [facility] b. Cath 03/2010: for med rx.; c. [name] myoview (1/14): Large Inferior apical MI, very small area of peri-infarct ischemia toward the inferior apical segment. EF 19%. Med Rx was continued.   .  Diabetes mellitus  1979     takes Metformni daily and Lantus 50units bid    Past Surgical History   Procedure  Laterality  Date   .  Revasculariztion   2011/2010     x 2   .  Coronary artery bypass graft   03/2000     quadruple   .  Tonsillectomy     .  Hernia repair     .  Cardiac catheterization   03/19/10   .  Abdominal aortagram   04-30-12   .  Adenoidectomy     .  Amputation  Left  06/03/2012     Procedure: AMPUTATION BELOW KNEE; Surgeon: [name], MD; Location: [facility] OR; Service: Vascular; Laterality:  Left;   .  Coronary angioplasty   2002     3 stents   .  Endarterectomy femoral  Right  03/25/2013     Procedure: ENDARTERECTOMY FEMORAL ; Surgeon: Fransisco HertzBrian L Chen, MD; [facility]; Service: Vascular; Laterality: Right;   .  Patch angioplasty  Right  03/25/2013     Procedure: PATCH ANGIOPLASTY USING VASCU-GUARD PERIPHERAL VASCULAR PATCH; Surgeon: Fransisco HertzBrian L Chen, MD; [facility]; Service: Vascular; Laterality: Right;   .  Amputation  Right  04/14/2013     Procedure: AMPUTATION BELOW KNEE ; Surgeon: Fransisco HertzBrian L Chen, MD; [facility]; Service: Vascular; Laterality: Right;   .  I&d extremity  Right  04/14/2013     Procedure: IRRIGATION AND DEBRIDEMENT OF RIGHT GROIN & PLACEMENT OF VAC DRESSING; Surgeon: Fransisco HertzBrian L Chen, MD; Location: [facility] OR; Service: Vascular; Laterality: Right;    Family History    Problem  Relation  Age of Onset   .  Heart disease  Mother      before age 67   .  Diabetes  Mother    .  Heart attack  Mother    .  Heart disease  Father     Social History: reports that he has quit smoking. His smoking use included Cigarettes. He smoked 0.00 packs per day. He has never used smokeless tobacco. He reports that he does not drink alcohol or use illicit drugs.  Allergies:  Allergies   Allergen  Reactions   .  Statins      Muscle aches   .  Amlodipine Besylate      Muscle aches making difficult to walk   .  Cephalexin      REACTION: Hives    Medications Prior to Admission   Medication  Sig  Dispense  Refill   .  ezetimibe (ZETIA) 10 MG tablet  Take 10 mg by mouth daily.     .  insulin glargine (LANTUS) 100 UNIT/ML injection  Inject 0.5 mLs (50 Units total) into the skin 2 (two) times daily. Please dispense a 90 day supply with refills to complete the year  10 pen  11   .  insulin lispro (HUMALOG) 100 UNIT/ML injection  Inject 5 Units into the skin 3 (three) times daily as needed for high blood sugar.     .  lisinopril (PRINIVIL,ZESTRIL) 5 MG tablet  Take 1 tablet (5 mg total) by mouth daily.  30 tablet  11   .  metoprolol succinate (TOPROL-XL) 50 MG 24 hr tablet  Take 1 tablet (50 mg total) by mouth 2 (two) times daily.  60 tablet  11   .  nitroGLYCERIN (NITROSTAT) 0.4 MG SL tablet  Place 0.4 mg under the tongue every 5 (five) minutes as needed for chest pain.     .  Saxagliptin-Metformin (KOMBIGLYZE XR) 2.08-998 MG TB24  Take 1 tablet by mouth daily.     [name].  oxyCODONE-acetaminophen (PERCOCET/ROXICET) 5-325 MG per tablet  Take 1 tablet by mouth every 6 (six) hours as needed for severe pain.  30 tablet  0    Home:  Home Living  Family/patient expects to be discharged to:: Inpatient rehab  Living Arrangements: Children  Available Help at Discharge: Available 24 hours/day;Family  Type of Home: House  Home Access: Ramped entrance  Home Layout: One level  Home Equipment:  Walker - 2 wheels;Bedside commode;Shower seat;Grab bars - tub/shower;Wheelchair - manual;Grab bars - toilet  Additional Comments: tub shower  Functional History:  Prior Function  Comments: amb with prosthesis and walker  Functional Status:  Mobility:    Ambulation/Gait  General Gait Details: unable to unweight Lt BKA prosthesis to step/hop with RW   ADL:  ADL  Eating/Feeding: Simulated;Modified independent  Where Assessed - Eating/Feeding: Bed level  Grooming: Performed;Wash/dry hands;Wash/dry face;Teeth care;Set up  Where Assessed - Grooming: Supported sitting  Upper Body Bathing: Performed;Chest;Right arm;Left arm;Abdomen;Min guard  Where Assessed - Upper Body Bathing: Supported sitting  Lower Body Bathing: Performed;Moderate assistance  Where Assessed - Lower Body Bathing: Rolling right and/or left;Lean right and/or left;Supine, head of bed flat  Upper Body Dressing: Performed;Min guard  Where Assessed - Upper Body Dressing: Supported sitting  Lower Body Dressing: Simulated;Maximal assistance  Where Assessed - Lower Body Dressing: Lean right and/or left;Rolling right and/or left  Toilet Transfer: Simulated;+2 Total assistance  Toilet Transfer Method: Patent attorney: Extra wide drop arm bedside commode  Tub/Shower Transfer Method: Not assessed  Transfers/Ambulation Related to ADLs: Pt performed bed mobility today w/ +1 assist.  ADL Comments: Pt was educated in role of OT and recommendations for in-pt Rehab/CIR. Pt verbalized understanding of this. Pt participated in ADL retraining session given frequent rest breaks and supported sitting/Min guard assist throughout. Pt requires increased time for all tasks. Will follow acutely.  Cognition:  Cognition  Overall Cognitive Status: Within Functional Limits for tasks assessed  Orientation Level: Oriented X4  Cognition  Arousal/Alertness: Awake/alert  Behavior During Therapy: WFL for tasks  assessed/performed  Overall Cognitive Status: Within Functional Limits for tasks assessed    Physical Exam:  Blood pressure 112/71, pulse 83, temperature 99.5 F (37.5 C), temperature source Oral, resp. rate 18, height 5\' 9"  (1.753 m), weight 129.4 kg (285 lb 4.4 oz), SpO2 96.00%.    Constitutional: He is oriented to person, place, and time. Obese african [place] male in no distress HENT: dentition poor Head: Normocephalic.  Eyes: EOM are normal.  Neck: Normal range of motion. Neck supple. No thyromegaly present.  Cardiovascular: Normal rate and regular rhythm. No murmurs or rubs Respiratory: Effort normal and breath sounds normal except for a few expiratory wheezes. No respiratory distress.  GI: Soft. Bowel sounds are normal. He exhibits no distension.  Neurological: He is alert and oriented to person, place, and time. CN exam normal. Good insight and awareness. UES 5/5 proximal to distal. LLE -HF and KE  4/5. RLE HF and KE 2+ to 3-. No gross sensory deficits.    Skin:  Right BKA site staples intact with minimal drainage. Left BKA site well healed/ pt wearing prosthesis. Vac in place right groin     Results for orders placed during the hospital encounter of 04/12/13 (from the past 48 hour(s))   GLUCOSE, CAPILLARY Status: Abnormal    Collection Time    04/19/13 4:18 PM   Result  Value  Range    Glucose-Capillary  111 (*)  70 - 99 mg/dL    Comment 1  Documented in Chart     Comment 2  Notify RN    GLUCOSE, CAPILLARY Status: Abnormal    Collection Time    04/19/13 9:13 PM   Result  Value  Range    Glucose-Capillary  197 (*)  70 - 99 mg/dL    Comment 1  Documented in Chart     Comment 2  Notify RN    GLUCOSE, CAPILLARY Status: Abnormal    Collection Time    04/20/13 6:25 AM   Result  Value  Range    Glucose-Capillary  68 (*)  70 - 99 mg/dL   GLUCOSE, CAPILLARY Status: None    Collection Time    04/20/13 6:52 AM   Result  Value  Range    Glucose-Capillary  81  70 - 99  mg/dL   GLUCOSE, CAPILLARY Status: Abnormal    Collection Time    04/20/13 11:29 AM   Result  Value  Range    Glucose-Capillary  165 (*)  70 - 99 mg/dL    Comment 1  Notify RN    CBC Status: Abnormal    Collection Time    04/20/13 3:00 PM   Result  Value  Range    WBC  10.2  4.0 - 10.5 K/uL    RBC  3.60 (*)  4.22 - 5.81 MIL/uL    Hemoglobin  10.8 (*)  13.0 - 17.0 g/dL    HCT  09.3 (*)  81.8 - 52.0 %    MCV  89.4  78.0 - 100.0 fL    MCH  30.0  26.0 - 34.0 pg    MCHC  33.5  30.0 - 36.0 g/dL    RDW  29.9  37.1 - 69.6 %    Platelets  309  150 - 400 K/uL   GLUCOSE, CAPILLARY Status: Abnormal    Collection Time    04/20/13 4:20 PM   Result  Value  Range    Glucose-Capillary  140 (*)  70 - 99 mg/dL    Comment 1  Documented in Chart     Comment 2  Notify RN    GLUCOSE, CAPILLARY Status: Abnormal    Collection Time    04/20/13 9:20 PM   Result  Value  Range    Glucose-Capillary  113 (*)  70 - 99 mg/dL    Comment 1  Documented in Chart     Comment 2  Notify RN    GLUCOSE, CAPILLARY Status: Abnormal    Collection Time    04/21/13 5:58 AM   Result  Value  Range    Glucose-Capillary  57 (*)  70 - 99 mg/dL   GLUCOSE, CAPILLARY Status: None    Collection Time    04/21/13 6:24 AM   Result  Value  Range    Glucose-Capillary  77  70 - 99 mg/dL   GLUCOSE, CAPILLARY Status: Abnormal    Collection Time    04/21/13 8:08 AM   Result  Value  Range    Glucose-Capillary  110 (*)  70 - 99 mg/dL   GLUCOSE, CAPILLARY Status: Abnormal    Collection Time    04/21/13 11:24 AM   Result  Value  Range    Glucose-Capillary  158 (*)  70 - 99 mg/dL    Comment 1  Notify RN     No results found.  Post Admission Physician Evaluation:  1. Functional deficits secondary to Right BKA, previous left BKA (with prosthesis). 2. Patient is admitted to receive collaborative, interdisciplinary care between the physiatrist, rehab nursing staff, and therapy team. 3. Patient's level of medical complexity and  substantial therapy needs in context of that medical necessity cannot be provided at a lesser intensity of care such as a SNF. 4. Patient has experienced substantial functional loss from his/her baseline which was documented above under the "Functional History" and "Functional Status" headings. Judging by the patient's diagnosis, physical exam, and functional history, the patient has potential for functional progress which will result in measurable gains while on  inpatient rehab. These gains will be of substantial and practical use upon discharge in facilitating mobility and self-care at the household level. 5. Physiatrist will provide 24 hour management of medical needs as well as oversight of the therapy plan/treatment and provide guidance as appropriate regarding the interaction of the two. 6. 24 hour rehab nursing will assist with bladder management, bowel management, safety, skin/wound care, disease management, medication administration, pain management and patient education and help integrate therapy concepts, techniques,education, etc. 7. PT will assess and treat for/with: Lower extremity strength, range of motion, stamina, balance, functional mobility, safety, adaptive techniques and equipment, pre-prosthetic education, pain mgt, education. Goals are: supervision to min assist. 8. OT will assess and treat for/with: ADL's, functional mobility, safety, upper extremity strength, adaptive techniques and equipment, pain mgt, family education. Goals WUJ:[id number] to min assist. 9. SLP will assess and treat for/with: n/a. Goals are: n/a (no direct intervention at this time). 10. Case Management and Social Worker will assess and treat for psychological issues and discharge planning. 11. Team conference will be held weekly to assess progress toward goals and to determine barriers to discharge. 12. Patient will receive at least 3 hours of therapy per day at least 5 days per week. 13. ELOS: 10-15 days   14. Prognosis: excellent   Medical Problem List and Plan:  1. 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 DVT Prophylaxis/Anticoagulation: Xarelto as directed  3. Pain Management: Oxycodone as needed. Monitor with increased mobility  4. Neuropsych: This patient is capable of making decisions on his own behalf.  5. 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 Patient uses a prosthesis but there were some issues with fit PTA 6. Hypertension. Lisinopril 5 mg daily, Toprol 50 mg twice a day. Monitor with increased mobility  7. Diabetes mellitus with peripheral neuropathy. Hemoglobin A1c 12.4. Continue insulin therapy as directed. Check CBGs a.c. and at bedtime. Provide full diabetic counseling  8. PAF.Xarelto. Cardiac rate controlled. Continue Lopressor  9. Hyperlipidemia. Zetia  10. Poor medical compliance. Provide counseling   Ranelle Oyster, MD, [facility] Physical Medicine & Rehabilitation   04/21/2013

## 2013-04-21 NOTE — Progress Notes (Signed)
Inpatient Diabetes Program Recommendations  AACE/ADA: New Consensus Statement on Inpatient Glycemic Control (2013)  Target Ranges:  Prepandial:   less than 140 mg/dL      Peak postprandial:   less than 180 mg/dL (1-2 hours)      Critically ill patients:  140 - 180 mg/dL   Results for Leroy [name], Leroy [name] (MRN [id number]) as of 04/21/2013 10:19  Ref. Range 04/20/2013 06:25 04/20/2013 06:52 04/20/2013 11:29 04/20/2013 16:20 04/20/2013 21:20 04/21/2013 05:58 04/21/2013 06:24 04/21/2013 08:08  Glucose-Capillary Latest Range: 70-99 mg/dL 68 (L) 81 563 (H) 149 (H) 113 (H) 57 (L) 77 110 (H)   Inpatient Diabetes Program Recommendations Insulin - Basal: Patient hypoglycemic again this morning.  Please discontinue current Levemir order.  Please consider ordering Levemir 45 units QAM (same am dose) and Levemir 40 units QHS (decreased bedtime dose).  Thanks, Orlando Penner, RN, MSN, CCRN Diabetes Coordinator Inpatient Diabetes Program [phone] (Team Pager) [phone] (AP office) [phone] [facility] office)

## 2013-04-21 NOTE — Progress Notes (Signed)
Clinical Social Work Department CLINICAL SOCIAL WORK PLACEMENT NOTE 04/21/2013  Patient:  Leroy [name], Leroy [name]  Account Number:  [id number] Admit date:  04/12/2013  Clinical Social Worker:  Carren Rang  Date/time:  04/19/2013 01:37 PM  Clinical Social Work is seeking post-discharge placement for this patient at the following level of care:   SKILLED NURSING   (*CSW will update this form in Epic as items are completed)   04/19/2013  Patient/family provided with [facility] Department of Clinical Social Work's list of facilities offering this level of care within the geographic area requested by the patient (or if unable, by the patient's family).  04/19/2013  Patient/family informed of their freedom to choose among providers that offer the needed level of care, that participate in Medicare, Medicaid or managed care program needed by the patient, have an available bed and are willing to accept the patient.    Patient/family informed of [facility]' ownership interest in [facility], as well as of the fact that they are under no obligation to receive care at this facility.  PASARR submitted to EDS on  PASARR number received from EDS on   FL2 transmitted to all facilities in geographic area requested by pt/family on  04/19/2013 FL2 transmitted to all facilities within larger geographic area on   Patient informed that his/her managed care company has contracts with or will negotiate with  certain facilities, including the following:     Patient/family informed of bed offers received:   Patient chooses bed at  Physician recommends and patient chooses bed at    Patient to be transferred to  on   Patient to be transferred to facility by   The following physician request were entered in Epic:   Additional Comments: Patient has previous PASRR and went to CIR.  Maree Krabbe, MSW, [facility] [phone]

## 2013-04-21 NOTE — Progress Notes (Signed)
Hypoglycemic Event  CBG: 57  Treatment: OJ  Symptoms:  Diaphoretic, lethargic  Follow-up CBG: Time:0625 CBG Result: 77  Possible Reasons for Event: 45 units of Lantus as HS  Comments: Pt has been hypoglycemic in the AM x2 days after receiving Lantus at night.    Clovis FredricksonShelton, Rayquan Amrhein A  Remember to initiate Hypoglycemia Order Set & complete

## 2013-04-22 ENCOUNTER — Inpatient Hospital Stay (HOSPITAL_COMMUNITY): Payer: Medicare HMO

## 2013-04-22 ENCOUNTER — Inpatient Hospital Stay (HOSPITAL_COMMUNITY): Payer: Medicare HMO | Admitting: Physical Therapy

## 2013-04-22 DIAGNOSIS — I10 Essential (primary) hypertension: Secondary | ICD-10-CM

## 2013-04-22 DIAGNOSIS — S88119A Complete traumatic amputation at level between knee and ankle, unspecified lower leg, initial encounter: Secondary | ICD-10-CM

## 2013-04-22 DIAGNOSIS — E1165 Type 2 diabetes mellitus with hyperglycemia: Secondary | ICD-10-CM

## 2013-04-22 DIAGNOSIS — L98499 Non-pressure chronic ulcer of skin of other sites with unspecified severity: Secondary | ICD-10-CM

## 2013-04-22 DIAGNOSIS — IMO0001 Reserved for inherently not codable concepts without codable children: Secondary | ICD-10-CM

## 2013-04-22 DIAGNOSIS — I4891 Unspecified atrial fibrillation: Secondary | ICD-10-CM

## 2013-04-22 DIAGNOSIS — I739 Peripheral vascular disease, unspecified: Secondary | ICD-10-CM

## 2013-04-22 LAB — COMPREHENSIVE METABOLIC PANEL
ALK PHOS: 155 U/L — AB (ref 39–117)
ALT: 36 U/L (ref 0–53)
AST: 30 U/L (ref 0–37)
Albumin: 1.9 g/dL — ABNORMAL LOW (ref 3.5–5.2)
BUN: 12 mg/dL (ref 6–23)
CALCIUM: 8.7 mg/dL (ref 8.4–10.5)
CO2: 23 mEq/L (ref 19–32)
Chloride: 100 mEq/L (ref 96–112)
Creatinine, Ser: 0.95 mg/dL (ref 0.50–1.35)
GFR calc Af Amer: >90 mL/min (ref >90)
GFR calc non Af Amer: 85 mL/min — ABNORMAL LOW (ref >90)
GLUCOSE: 122 mg/dL — AB (ref 70–99)
Potassium: 4.3 mEq/L (ref 3.7–5.3)
Sodium: 138 mEq/L (ref 137–147)
TOTAL PROTEIN: 6.9 g/dL (ref 6.0–8.3)
Total Bilirubin: <0.2 mg/dL — ABNORMAL LOW (ref 0.3–1.2)

## 2013-04-22 LAB — CBC WITH DIFFERENTIAL/PLATELET
Basophils Absolute: 0 10*3/uL (ref 0.0–0.1)
Basophils Relative: 0 % (ref 0–1)
EOS PCT: 1 % (ref 0–5)
Eosinophils Absolute: 0.2 10*3/uL (ref 0.0–0.7)
HCT: 35.1 % — ABNORMAL LOW (ref 39.0–52.0)
HEMOGLOBIN: 11.6 g/dL — AB (ref 13.0–17.0)
LYMPHS ABS: 2 10*3/uL (ref 0.7–4.0)
Lymphocytes Relative: 17 % (ref 12–46)
MCH: 30.1 pg (ref 26.0–34.0)
MCHC: 33 g/dL (ref 30.0–36.0)
MCV: 90.9 fL (ref 78.0–100.0)
MONOS PCT: 9 % (ref 3–12)
Monocytes Absolute: 1 10*3/uL (ref 0.1–1.0)
Neutro Abs: 8.2 10*3/uL — ABNORMAL HIGH (ref 1.7–7.7)
Neutrophils Relative %: 72 % (ref 43–77)
Platelets: 387 10*3/uL (ref 150–400)
RBC: 3.86 MIL/uL — AB (ref 4.22–5.81)
RDW: 13.1 % (ref 11.5–15.5)
WBC: 11.4 10*3/uL — ABNORMAL HIGH (ref 4.0–10.5)

## 2013-04-22 LAB — GLUCOSE, CAPILLARY
GLUCOSE-CAPILLARY: 63 mg/dL — AB (ref 70–99)
GLUCOSE-CAPILLARY: 246 mg/dL — AB (ref 70–99)
Glucose-Capillary: 87 mg/dL (ref 70–99)
Glucose-Capillary: 123 mg/dL — ABNORMAL HIGH (ref 70–99)
Glucose-Capillary: 86 mg/dL (ref 70–99)
Glucose-Capillary: 235 mg/dL — ABNORMAL HIGH (ref 70–99)
Glucose-Capillary: 199 mg/dL — ABNORMAL HIGH (ref 70–99)

## 2013-04-22 MED ORDER — GABAPENTIN 100 MG PO CAPS
100.0000 mg | ORAL_CAPSULE | Freq: Three times a day (TID) | ORAL | Status: DC
  Administered 2013-04-22 – 2013-05-07 (×47): 100 mg via ORAL
  Filled 2013-04-22 (×49): qty 1

## 2013-04-22 MED ORDER — RIVAROXABAN 20 MG PO TABS
20.0000 mg | ORAL_TABLET | Freq: Every day | ORAL | Status: DC
  Administered 2013-04-22 – 2013-05-06 (×15): 20 mg via ORAL
  Filled 2013-04-22 (×16): qty 1

## 2013-04-22 MED ORDER — SODIUM CHLORIDE 0.9 % IJ SOLN
10.0000 mL | INTRAMUSCULAR | Status: DC | PRN
  Administered 2013-04-22 – 2013-04-23 (×2): 10 mL

## 2013-04-22 NOTE — Progress Notes (Signed)
Physical Therapy Session Note  Patient Details  Name: Leroy [name] MRN: [id number] Date of Birth: 09-Nov-1945  Today's Date: 04/22/2013 Time: 1510-1540 Time Calculation (min): 30 min  Short Term Goals: Week 1:  PT Short Term Goal 1 (Week 1): Pt will transfer bed<> chair with moderate assistance.  PT Short Term Goal 2 (Week 1): Pt will perform 1 min continuous standing with +2 assistance or mechanical lift PT Short Term Goal 3 (Week 1): Pt will perform supine>sit with min assist.  Skilled Therapeutic Interventions/Progress Updates:  [facility] not initially agreeable to PT, but participated with encouragement from NT. Tx focused on therex for strength and ROM, dynamic sitting balance, education on positioning and self-care, and WC propulsion in controlled setting for UE strength and activity tolerance.  Seated therex including LAQ and marching x10 with verbal cues for technique. Passive HS stretch x812min for functional ROM.  Discussed supine and seated positioning for optimal residual limb ROM. Locked bed with LEs in extended position.  Dynamic sitting balance with pt reaching outside BOS in all directions. Pt would stick arm out towards target, and hold it there for 10-20 seconds, not leaning or reaching, gaze to there ground, despite cues. Pt hesitant to reach outside BOS, but was eventually able to in limited range, given cues to look at target.  WC propulsion x75' in hall with back unsupported, 3 brief rest breaks. Pt unable to see large 'wet floor' sing 2930' away.  [facility] with all needs in reach, NT present for vitals.  Pt with no further questions at this time.   Therapy Documentation Precautions:  Precautions Precautions: Fall Precaution Comments: reports falling at home 3-4 times in 6 months. Often falls backwards when using rollator walker particularly on ramp Restrictions Weight Bearing Restrictions: No Other Position/Activity Restrictions: No weight bearing orders,  anticipate pt non-weight bearing through amputated extremity will receive clarification.    Vital Signs: Therapy Vitals Temp: 97.4 F (36.3 C) Temp src: Oral Pulse Rate: 90 Resp: 16 BP: 128/84 mmHg Patient Position, if appropriate: Sitting Oxygen Therapy SpO2: 100 % O2 Device: None (Room air) Pain: None  See FIM for current functional status  Therapy/Group: Individual Therapy Clydene Lamingole Audrie Kuri, PT, DPT  04/22/2013, 3:54 PM

## 2013-04-22 NOTE — Progress Notes (Signed)
Subjective/Complaints: Restless night. Having phantom pain. Sugars low also. Denies sob, cp, cough A 12 point review of systems has been performed and if not noted above is otherwise negative.   Objective: Vital Signs: Blood pressure 102/53, pulse 56, temperature 97.9 F (36.6 C), temperature source Oral, resp. rate 16, SpO2 99.00%. No results found.  Recent Labs  04/20/13 1500 04/22/13 0504  WBC 10.2 11.4*  HGB 10.8* 11.6*  HCT 32.2* 35.1*  PLT 309 387    Recent Labs  04/22/13 0504  NA 138  K 4.3  CL 100  GLUCOSE 122*  BUN 12  CREATININE 0.95  CALCIUM 8.7   CBG (last 3)   Recent Labs  04/22/13 0337 04/22/13 0543 04/22/13 0718  GLUCAP 87 123* 86    Wt Readings from Last 3 Encounters:  04/12/13 129.4 kg (285 lb 4.4 oz)  04/12/13 129.4 kg (285 lb 4.4 oz)  04/12/13 129.4 kg (285 lb 4.4 oz)    Physical Exam:  Constitutional: He is oriented to person, place, and time. Obese african [place] male in no distress  HENT: dentition poor  Head: Normocephalic.  Eyes: EOM are normal.  Neck: Normal range of motion. Neck supple. No thyromegaly present.  Cardiovascular: Normal rate and regular rhythm. No murmurs or rubs  Respiratory: Effort normal and breath sounds normal except for a few expiratory wheezes. No respiratory distress.  GI: Soft. Bowel sounds are normal. He exhibits no distension.  Neurological: He is alert and oriented to person, place, and time. CN exam normal. Good insight and awareness. UES 5/5 proximal to distal. LLE -HF and KE 4/5. RLE HF and KE 2+ to 3-. No gross sensory deficits.  Skin:  Right BKA site staples intact with no drainage. Left BKA site well healed/ pt wearing prosthesis. Vac in place right groin   Assessment/Plan: 1. Functional deficits secondary to new right BKA, recent left BKA which require 3+ hours per day of interdisciplinary therapy in a comprehensive inpatient rehab setting. Physiatrist is providing close team supervision  and 24 hour management of active medical problems listed below. Physiatrist and rehab team continue to assess barriers to discharge/monitor patient progress toward functional and medical goals. FIM:                   Comprehension Comprehension Mode: Auditory Comprehension: 5-Understands complex 90% of the time/Cues < 10% of the time  Expression Expression Mode: Verbal Expression: 5-Expresses complex 90% of the time/cues < 10% of the time  Social Interaction Social Interaction: 6-Interacts appropriately with others with medication or extra time (anti-anxiety, antidepressant).  Problem Solving Problem Solving: 5-Solves complex 90% of the time/cues < 10% of the time  Memory Memory: 7-Complete Independence: No helper  Medical Problem List and Plan:  1. 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 DVT Prophylaxis/Anticoagulation: Xarelto as directed  3. Pain Management: Oxycodone as needed. Monitor with increased mobility   -add gabapentin for phantom limb pain 4. Neuropsych: This patient is capable of making decisions on his own behalf.  5. 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 Patient uses a prosthesis but there were some issues with fit PTA  6. Hypertension. Lisinopril 5 mg daily, Toprol 50 mg twice a day. Monitor with increased mobility  7. Diabetes mellitus with peripheral neuropathy. Hemoglobin A1c 12.4. SSI.   - Check CBGs a.c. and at bedtime. Provide full diabetic counseling   -sugars low last night. Ate a good dinner---monitor  today 8. PAF.Xarelto. Cardiac rate controlled. Continue Lopressor  9. Hyperlipidemia. Zetia  10. Poor medical compliance. Provide counseling   LOS (Days) 1 A FACE TO FACE EVALUATION WAS PERFORMED  Breia Ocampo T 04/22/2013 8:30 AM

## 2013-04-22 NOTE — Evaluation (Signed)
Physical Therapy Assessment and Plan  Patient Details  Name: Leroy [name] MRN: [id number] Date of Birth: March 22, 1946  PT Diagnosis: Cognitive deficits, Difficulty walking, Impaired cognition, Muscle weakness and Pain in amputated limb Rehab Potential: Good ELOS: 16 days   Today's Date: 04/22/2013 Time: 0930-1030 Time Calculation (min): 60 min  Problem List:  Patient Active Problem List   Diagnosis Date Noted  . S/P bilateral BKA (below knee amputation) 04/21/2013  . Chronic systolic heart failure 23/55/7322  . Discoloration of skin of foot-Right  04/12/2013  . Atherosclerosis of native arteries of the extremities with gangrene 04/12/2013  . Gangrene of foot 04/12/2013  . 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 Critical lower limb ischemia 03/09/2013  . Foot pain 03/09/2013  . Peripheral vascular disease, unspecified 11/06/2012  . Unilateral complete BKA 09/10/2012  . SVT (supraventricular tachycardia) 09/02/2012  . Onychomycosis of toenail 01/02/2012  . Hyperlipidemia, familial, high LDL 01/02/2012  . Type II or unspecified type diabetes mellitus with neurological manifestations, uncontrolled(250.62) 05/03/2010  . CAD 04/13/2010  . CARDIOMYOPATHY 04/13/2010  . ATRIAL FIBRILLATION, PAROXYSMAL 04/13/2010    Past Medical History:  Past Medical History  Diagnosis Date  . Hyperlipidemia   . Ischemic cardiomyopathy     a. EF 40% 2010. b. Echo (2/14):  mild LVH, EF 30-35%, diff HK, Gr 1 DD, MAC, mild MR, mod LAE, PASP 39  . Cataract   . Hypotension   . Paroxysmal atrial fibrillation   . Peripheral vascular disease     a. s/p L BKA 2014.  [name] History of blood clots     in legs--this was in 2011--has been off of Coumadin 30month;takes Xarelto daily  . Peripheral neuropathy   . Joint pain   . H/O hiatal hernia   . Chronic kidney disease     on Lisinopril to protect kidneys d/t being on Metformin per pt  . Myocardial infarction     total of 8 heart attacks;last one in 2011  .  Atrial flutter   . H/O medication noncompliance     Due to insurance issues  . Chronic systolic CHF (congestive heart failure)   . Coronary artery disease     a. s/p remote CABG 2001 at [facility] b. Cath 03/2010: for med rx.;  c.  [facility] (1/14):  Large Inferior apical MI, very small area of peri-infarct ischemia toward the inferior apical segment. EF 19%.  Med Rx was continued.    . Diabetes mellitus 1979    takes Metformni daily  and Lantus 50units bid   Past Surgical History:  Past Surgical History  Procedure Laterality Date  . Revasculariztion  2011/2010    x 2   . Coronary artery bypass graft  03/2000    quadruple  . Tonsillectomy    . Hernia repair    . Cardiac catheterization  03/19/10  . Abdominal aortagram  04-30-12  . Adenoidectomy    . Amputation Left 06/03/2012    Procedure: AMPUTATION BELOW KNEE;  Surgeon: BConrad [place] MD;  [facility]  Service: Vascular;  Laterality: Left;  . Coronary angioplasty  2002    3 stents  . Endarterectomy femoral Right 03/25/2013    Procedure: ENDARTERECTOMY FEMORAL ;  Surgeon: BConrad [place] MD;  [facility]  Service: Vascular;  Laterality: Right;  . Patch angioplasty Right 03/25/2013    Procedure: PATCH ANGIOPLASTY USING VASCU-GUARD PERIPHERAL VASCULAR PATCH;  Surgeon: BConrad [place] MD;  [facility]  Service: Vascular;  Laterality: Right;  .  Amputation Right 04/14/2013    Procedure: AMPUTATION BELOW KNEE ;  Surgeon: Conrad [place], MD;  Location: [facility] OR;  Service: Vascular;  Laterality: Right;  . I&d extremity Right 04/14/2013    Procedure: IRRIGATION AND DEBRIDEMENT OF RIGHT  GROIN & PLACEMENT OF VAC DRESSING;  Surgeon: Conrad [place], MD;  Location: [place];  Service: Vascular;  Laterality: Right;    Assessment & Plan Clinical Impression:Leroy [name] is a 68 y.o. right-handed male with history of PAF, CAD with CABG, diabetes mellitus with peripheral neuropathy, peripheral vascular disease with history of DVT maintained on  Xarelto but recently stopped taking with poor medical compliance as well as left BKA 06/03/2012 and uses a prosthesis. Patient did not receive inpatient rehabilitation services secondary to insurance issues/denial per [facility]. Admitted 04/12/2013 with right foot gangrenous changes as well as recent right iliofemoral endarterectomy with poor healing. Limb was not felt to be salvageable and underwent right below-knee amputation as well as superficial right groin dehiscence with debridement 04/14/2013 per Dr. Bridgett Larsson. Postoperative pain management as well as postoperative VAC. Hemoglobin A1c 12.4 with insulin therapy as directed. Patient transferred to CIR on 04/21/2013 .   Patient currently requires +2 total with mobility secondary to muscle weakness, decreased cardiorespiratoy endurance, decreased initiation, decreased attention, decreased awareness, decreased problem solving, decreased safety awareness and delayed processing and decreased sitting balance and decreased balance strategies.  Prior to hospitalization, patient was modified independent  with mobility and lived with Daughter (daughter lives with pt) in a House home.  Home access is  Ramped entrance.  Patient will benefit from skilled PT intervention to maximize safe functional mobility, minimize fall risk and decrease caregiver burden for planned discharge home with 24 hour assist.  Anticipate patient will benefit from follow up [facility] at discharge.  PT - End of Session Endurance Deficit: Yes PT Assessment Rehab Potential: Good Barriers to Discharge: Decreased caregiver support PT Patient demonstrates impairments in the following area(s): Balance;Endurance;Pain;Safety PT Transfers Functional Problem(s): Bed Mobility;Bed to Chair;Car PT Locomotion Functional Problem(s): Ambulation;Wheelchair Mobility PT Plan PT Intensity: Minimum of 1-2 x/day ,45 to 90 minutes PT Frequency: 5 out of 7 days PT Duration Estimated Length of Stay: 16 days PT  Treatment/Interventions: Ambulation/gait training;Balance/vestibular training;Cognitive remediation/compensation;Discharge planning;Community reintegration;DME/adaptive equipment instruction;Neuromuscular re-education;Psychosocial support;Pain management;Patient/family education;Therapeutic Activities;Therapeutic Exercise;UE/LE Coordination activities;UE/LE Strength taining/ROM;Wheelchair propulsion/positioning PT Transfers Anticipated Outcome(s): Supervision/min assist PT Locomotion Anticipated Outcome(s): Wheelchair level, supervision PT Recommendation Follow Up Recommendations: Home health PT;24 hour supervision/assistance Patient destination: Home (as long as 24 hour supervision available) Equipment Recommended: To be determined  Skilled Therapeutic Intervention  Session focused on appropriate donning of liner, sock, and prosthesis to utilize for safety during sliding board transfer bed > wheelchair. Pt requires min assist for sitting balance. Practiced board placement and removal including lateral leans to Rt/Lt. With sliding board transfer pt educated on head/hips relationship and weightshifting for ease of transfer. Pt cued to "push" self across board as opposed to "pulling" self. Pt utilized pad and shorts to minimize friction on board. Educated pt on positioning of Rt. LE to decrease risk of contracture at knee.  PT Evaluation Precautions/Restrictions Precautions Precautions: Fall Precaution Comments: reports falling at home 3-4 times in 6 months. Often falls backwards when using rollator walker particularly on ramp Restrictions Weight Bearing Restrictions: No Other Position/Activity Restrictions: No weight bearing orders, anticipate pt non-weight bearing through amputated extremity will receive clarification. General   Vital SignsTherapy Vitals Pulse Rate: 104 BP: 104/74 mmHg Pain Pain Assessment Pain Assessment:  0-10 Pain Score: 4  Pain Type: Surgical pain Pain Location:  Leg Pain Orientation: Right Pain Descriptors / Indicators: Operative site guarding Pain Frequency: Intermittent Pain Onset: With Activity Patients Stated Pain Goal: 2 Pain Intervention(s): Medication (See eMAR) Home Living/Prior Functioning Home Living Available Help at Discharge:  ("hasn't been decided yet") Type of Home: House Home Access: Ramped entrance Home Layout: One level Additional Comments: tub shower  Lives With: Daughter (daughter lives with pt) Prior Function Level of Independence: Requires assistive device for independence  Able to Take Stairs?: Yes Driving: Yes Vocation: Retired (and disability) Comments: amb with prosthesis and walker, would scoot across bathroom in metal chair to access toilet and tub.  Vision/Perception  Vision - History Visual History: Cataracts;Other (comment) (right eye, incomplete surgical intervention for cataracts) Patient Visual Report: No change from baseline Vision - Assessment Eye Alignment: Within Functional Limits Perception Perception: Within Functional Limits Praxis Praxis: Intact  Cognition Overall Cognitive Status: Within Functional Limits for tasks assessed Arousal/Alertness: Awake/alert Orientation Level: Oriented X4 Attention: Selective Selective Attention: Appears intact Alternating Attention: Impaired Alternating Attention Impairment: Functional basic Memory:  (unable to fully assess) Awareness: Impaired Problem Solving: Impaired Problem Solving Impairment: Functional complex Executive Function: Self Correcting Self Correcting: Impaired Self Correcting Impairment: Functional complex Safety/Judgment: Impaired Sensation Sensation Light Touch: Appears Intact (along residual limbs however difficult to assess for Rt. LE due to bandage) Stereognosis: Appears Intact Hot/Cold: Appears Intact Proprioception: Appears Intact Coordination Gross Motor Movements are Fluid and Coordinated: Yes Fine Motor Movements are  Fluid and Coordinated: Yes Motor  Motor Motor: Within Functional Limits  Mobility Bed Mobility Bed Mobility: Rolling Right;Right Sidelying to Sit;Supine to Sit Rolling Right: 4: Min assist;With rail Right Sidelying to Sit: 3: Mod assist;HOB flat;With rails Right Sidelying to Sit Details (indicate cue type and reason): cues for sequencing and initiation. Pt requires rest breaks throughout task.  Supine to Sit: 3: Mod assist;HOB flat Transfers Transfers: Yes Lateral/Scoot Transfers: 1: +2 Total assist;2: Max assist;With slide board Lateral/Scoot Transfer Details (indicate cue type and reason): +2 for safety, cues for weight shift and head/hips relationship. PT to place and remove board. Lt. knee blocked and wheelchair stabilized to prevent sliding.  Locomotion    unsafe to attempt Trunk/Postural Assessment  Postural Control Postural Control: Deficits on evaluation Righting Reactions: Decreased on evaluation  Balance Balance Balance Assessed: Yes Static Sitting Balance Static Sitting - Balance Support: Right upper extremity supported;Left upper extremity supported;Feet supported;Feet unsupported Static Sitting - Level of Assistance: 5: Stand by assistance Dynamic Sitting Balance Dynamic Sitting - Balance Support: Feet supported;Feet unsupported;No upper extremity supported Dynamic Sitting - Level of Assistance: 4: Min assist Dynamic Sitting - Balance Activities: Lateral lean/weight shifting;Reaching across midline;Forward lean/weight shifting Sitting balance - Comments: Pt able to don prosthesis with min assist for sitting balance Extremity Assessment  RUE Assessment RUE Assessment: Within Functional Limits LUE Assessment LUE Assessment: Within Functional Limits RLE Assessment RLE Assessment: Exceptions to [facility] RLE AROM (degrees) RLE Overall AROM Comments: Decreased knee extension, limited by ~25 degrees extension RLE Strength RLE Overall Strength Comments: Hip musculature  ~3-/5, unable to test knee musculature due to pain LLE Assessment LLE Assessment: Exceptions to [facility] LLE Strength LLE Overall Strength Comments: Generalized deconditioning, grossly >/= 3+/5 for residual musculature  FIM:  FIM - Bed/Chair Transfer Bed/Chair Transfer Assistive Devices: Prosthesis;Sliding board Bed/Chair Transfer: 3: Supine > Sit: Mod A (lifting assist/Pt. 50-74%/lift 2 legs;1: Two helpers FIM - Locomotion: Ambulation Locomotion: Ambulation: 0: Activity did not occur (unable at  this time)   Refer to Care Plan for Long Term Goals  Recommendations for other services: None  Discharge Criteria: Patient will be discharged from PT if patient refuses treatment 3 consecutive times without medical reason, if treatment goals not met, if there is a change in medical status, if patient makes no progress towards goals or if patient is discharged from hospital.  The above assessment, treatment plan, treatment alternatives and goals were discussed and mutually agreed upon: by patient  Lahoma Rocker 04/22/2013, 12:41 PM

## 2013-04-22 NOTE — Significant Event (Signed)
Hypoglycemic Event  CBG: 63   Treatment: 15 GM carbohydrate snack  Symptoms: Sweaty and Shaky  Follow-up CBG: Time:0337 CBG Result:87  Possible Reasons for Event: Other: Inadequate HS snake  Comments/MD notified:PO carb snack given, will continue to monitor.    [name], Leroy [name]  Remember to initiate Hypoglycemia Order Set & complete

## 2013-04-22 NOTE — Progress Notes (Signed)
Patient information reviewed and entered into eRehab system by Zyshonne Malecha, RN, CRRN, PPS Coordinator.  Information including medical coding and functional independence measure will be reviewed and updated through discharge.     Per nursing patient was given "Data Collection Information Summary for Patients in Inpatient Rehabilitation Facilities with attached "Privacy Act Statement-Health Care Records" upon admission.  

## 2013-04-22 NOTE — Evaluation (Signed)
Occupational Therapy Assessment and Plan  Patient Details  Name: Leroy [name] MRN: [id number] Date of Birth: 1945-07-26  OT Diagnosis: muscle weakness (generalized) Rehab Potential: Rehab Potential: Good ELOS: 14-16 days   Today's Date: 04/22/2013 Time: 0730-0830 Time Calculation (min): 60 min  Problem List:  Patient Active Problem List   Diagnosis Date Noted  . S/P bilateral BKA (below knee amputation) 04/21/2013  . Chronic systolic heart failure 61/22/4497  . Discoloration of skin of foot-Right  04/12/2013  . Atherosclerosis of native arteries of the extremities with gangrene 04/12/2013  . Gangrene of foot 04/12/2013  . 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 Critical lower limb ischemia 03/09/2013  . Foot pain 03/09/2013  . Peripheral vascular disease, unspecified 11/06/2012  . Unilateral complete BKA 09/10/2012  . SVT (supraventricular tachycardia) 09/02/2012  . Onychomycosis of toenail 01/02/2012  . Hyperlipidemia, familial, high LDL 01/02/2012  . Type II or unspecified type diabetes mellitus with neurological manifestations, uncontrolled(250.62) 05/03/2010  . CAD 04/13/2010  . CARDIOMYOPATHY 04/13/2010  . ATRIAL FIBRILLATION, PAROXYSMAL 04/13/2010    Past Medical History:  Past Medical History  Diagnosis Date  . Hyperlipidemia   . Ischemic cardiomyopathy     a. EF 40% 2010. b. Echo (2/14):  mild LVH, EF 30-35%, diff HK, Gr 1 DD, MAC, mild MR, mod LAE, PASP 39  . Cataract   . Hypotension   . Paroxysmal atrial fibrillation   . Peripheral vascular disease     a. s/p L BKA 2014.  [name] History of blood clots     in legs--this was in 2011--has been off of Coumadin 55month;takes Xarelto daily  . Peripheral neuropathy   . Joint pain   . H/O hiatal hernia   . Chronic kidney disease     on Lisinopril to protect kidneys d/t being on Metformin per pt  . Myocardial infarction     total of 8 heart attacks;last one in 2011  . Atrial flutter   . H/O medication noncompliance      Due to insurance issues  . Chronic systolic CHF (congestive heart failure)   . Coronary artery disease     a. s/p remote CABG 2001 at [facility] b. Cath 03/2010: for med rx.;  c.  [facility] (1/14):  Large Inferior apical MI, very small area of peri-infarct ischemia toward the inferior apical segment. EF 19%.  Med Rx was continued.    . Diabetes mellitus 1979    takes Metformni daily  and Lantus 50units bid   Past Surgical History:  Past Surgical History  Procedure Laterality Date  . Revasculariztion  2011/2010    x 2   . Coronary artery bypass graft  03/2000    quadruple  . Tonsillectomy    . Hernia repair    . Cardiac catheterization  03/19/10  . Abdominal aortagram  04-30-12  . Adenoidectomy    . Amputation Left 06/03/2012    Procedure: AMPUTATION BELOW KNEE;  Surgeon: BConrad Eldon MD;  [facility]  Service: Vascular;  Laterality: Left;  . Coronary angioplasty  2002    3 stents  . Endarterectomy femoral Right 03/25/2013    Procedure: ENDARTERECTOMY FEMORAL ;  Surgeon: BConrad [place] MD;  [facility]  Service: Vascular;  Laterality: Right;  . Patch angioplasty Right 03/25/2013    Procedure: PATCH ANGIOPLASTY USING VASCU-GUARD PERIPHERAL VASCULAR PATCH;  Surgeon: BConrad  MD;  [facility] [facility]  Service: Vascular;  Laterality: Right;  . Amputation Right 04/14/2013  Procedure: AMPUTATION BELOW KNEE ;  Surgeon: Conrad [place], MD;  Location: [facility] OR;  Service: Vascular;  Laterality: Right;  . I&d extremity Right 04/14/2013    Procedure: IRRIGATION AND DEBRIDEMENT OF RIGHT  GROIN & PLACEMENT OF VAC DRESSING;  Surgeon: Conrad Ambia, MD;  Location: [facility] OR;  Service: Vascular;  Laterality: Right;    Assessment & Plan Clinical Impression: Patient is a 68 y.o. year old right-handed male with history of PAF, CAD with CABG, diabetes mellitus with peripheral neuropathy, peripheral vascular disease with history of DVT maintained on Xarelto but recently stopped taking with poor  medical compliance and left BKA 06/03/2012 and uses a prosthesis.   Admitted 04/12/2013 with right foot gangrenous changes as well as recent right iliofemoral endarterectomy with poor healing. Limb was not felt to be salvageable and underwent right below-knee dictation as well as superficial right groin dehiscence with debridement 04/14/2013 per Dr. Bridgett Larsson. Postoperative pain management as well as postoperative VAC to right groin area. Hemoglobin A1c 12.4 with insulin therapy as directed.   Patient transferred to CIR on 04/21/2013.    Patient currently requires max with basic self-care skills secondary to muscle weakness.  Prior to hospitalization, patient could complete BADL/iADL with independent .  Patient will benefit from skilled intervention to increase independence with basic self-care skills prior to discharge home independently.  Anticipate patient will require intermittent supervision and follow up home health.  OT - End of Session Activity Tolerance: Tolerates 30+ min activity with multiple rests Endurance Deficit: Yes OT Assessment Rehab Potential: Good Barriers to Discharge: Decreased caregiver support OT Patient demonstrates impairments in the following area(s): Balance;Endurance;Pain;Perception;Safety OT Basic ADL's Functional Problem(s): Eating;Bathing;Dressing;Toileting OT Transfers Functional Problem(s): Toilet;Tub/Shower OT Additional Impairment(s): None OT Plan OT Intensity: Minimum of 1-2 x/day, 45 to 90 minutes OT Frequency: 5 out of 7 days OT Duration/Estimated Length of Stay: 14-16 days OT Treatment/Interventions: Balance/vestibular training;Self Care/advanced ADL retraining;Functional mobility training;Wheelchair propulsion/positioning;Therapeutic Activities;Therapeutic Exercise;Patient/family education;DME/adaptive equipment instruction;Discharge planning;Pain management OT Self Feeding Anticipated Outcome(s): I OT Basic Self-Care Anticipated Outcome(s): Mod I OT  Toileting Anticipated Outcome(s): Mod I OT Bathroom Transfers Anticipated Outcome(s): Mod I OT Recommendation Patient destination: Home Follow Up Recommendations: Home health OT Equipment Recommended: To be determined   Skilled Therapeutic Intervention 1:1 OT initial evaluation completed with treatment provided to focus on re-orientation to inpatient rehab processes, goals and methods, bed mobility, and transfers.   Patient required extra time to process schedule and tasks presented but completed bed mobility and attempted squat-pivot transfer with mod assist for bed mobility.   Patient was unable to process instruction for squat-pivot and became too fatigued to complete slide board transfer successfully.  OT Evaluation Precautions/Restrictions  Precautions Precautions: Fall Precaution Comments: reports falling at home 3-4 times in 6 months Restrictions Weight Bearing Restrictions: No  General Chart Reviewed: Yes Family/Caregiver Present: No  Vital Signs Therapy Vitals Temp: 97.9 F (36.6 C) Temp src: Oral Pulse Rate: 56 Resp: 16 BP: 102/53 mmHg Patient Position, if appropriate: Lying Oxygen Therapy SpO2: 99 % O2 Device: None (Room air)  Pain Pain Assessment Pain Assessment: 0-10 Pain Score: 3  Pain Type: Surgical pain Pain Location: Leg Pain Orientation: Right Pain Descriptors / Indicators: Operative site guarding Pain Onset: With Activity Patients Stated Pain Goal: 2 Pain Intervention(s): Repositioned (during dressing change) Multiple Pain Sites: No  Home Living/Prior Functioning Home Living Available Help at Discharge: Available 24 hours/day;Friend(s) (ex-girlfriend Kim is/was a CNA) Type of Home: House Home Access: Ramped entrance Home Layout:  One level Additional Comments: tub shower  Lives With: Other (Comment)  ADL ADL ADL Comments:  (see FIM)  Vision/Perception  Vision - History Visual History: Cataracts;Other (comment) (right eye, incomplete  surgical intervention for cataracts) Patient Visual Report: No change from baseline Vision - Assessment Eye Alignment: Within Functional Limits Perception Perception: Within Functional Limits Praxis Praxis: Intact   Cognition Overall Cognitive Status: Within Functional Limits for tasks assessed Arousal/Alertness: Awake/alert Orientation Level: Oriented X4 Attention: Alternating Alternating Attention: Appears intact Memory: Appears intact Awareness: Appears intact Problem Solving: Impaired Problem Solving Impairment: Functional complex Executive Function: Self Correcting Self Correcting: Impaired Self Correcting Impairment: Functional complex Safety/Judgment: Appears intact  Sensation Sensation Light Touch: Appears Intact Stereognosis: Appears Intact Hot/Cold: Appears Intact Proprioception: Appears Intact Coordination Gross Motor Movements are Fluid and Coordinated: Yes Fine Motor Movements are Fluid and Coordinated: Yes  Motor  Motor Motor: Within Functional Limits  Mobility  Bed Mobility Bed Mobility: Rolling Right;Right Sidelying to Sit;Supine to Sit Rolling Right: 4: Min assist;With rail Right Sidelying to Sit: 3: Mod assist;HOB flat;With rails Right Sidelying to Sit Details (indicate cue type and reason): cues for sequencing and initiation. Pt requires rest breaks throughout task.  Supine to Sit: 3: Mod assist;HOB flat   Trunk/Postural Assessment  Postural Control Postural Control: Deficits on evaluation Righting Reactions: Decreased on evaluation   Balance Balance Balance Assessed: Yes Static Sitting Balance Static Sitting - Balance Support: Right upper extremity supported;Left upper extremity supported;Feet supported;Feet unsupported Static Sitting - Level of Assistance: 5: Stand by assistance Dynamic Sitting Balance Dynamic Sitting - Balance Support: Feet supported;Feet unsupported;No upper extremity supported Dynamic Sitting - Level of Assistance: 4:  Min assist Dynamic Sitting - Balance Activities: Lateral lean/weight shifting;Reaching across midline;Forward lean/weight shifting Sitting balance - Comments: Pt able to don prosthesis with min assist for sitting balance  Extremity/Trunk Assessment RUE Assessment RUE Assessment: Within Functional Limits LUE Assessment LUE Assessment: Within Functional Limits  FIM:  FIM - Eating Eating Activity: 7: Complete independence:no helper FIM - Grooming Grooming Steps: Wash, rinse, dry face Grooming: 5: Set-up assist to obtain items FIM - Bathing Bathing: 0: Activity did not occur FIM - Upper Body Dressing/Undressing Upper body dressing/undressing: 0: Activity did not occur FIM - Lower Body Dressing/Undressing Lower body dressing/undressing: 0: Activity did not occur FIM - Bed/Chair Transfer Bed/Chair Transfer Assistive Devices: Bed rails;Arm rests Bed/Chair Transfer: 3: Supine > Sit: Mod A (lifting assist/Pt. 50-74%/lift 2 legs;3: Sit > Supine: Mod A (lifting assist/Pt. 50-74%/lift 2 legs)   Refer to Care Plan for Long Term Goals  Recommendations for other services: None  Discharge Criteria: Patient will be discharged from OT if patient refuses treatment 3 consecutive times without medical reason, if treatment goals not met, if there is a change in medical status, if patient makes no progress towards goals or if patient is discharged from hospital.  The above assessment, treatment plan, treatment alternatives and goals were discussed and mutually agreed upon: by patient  Second session: Time: 1345-1430 Time Calculation (min):  45 min  Pain Assessment: No pain  Skilled Therapeutic Interventions: ADL-retraining with focus on sit <> stand at w/c, grooming and upper body bathing/dressing at sink.   Patient re-educated on w/c use and positioning and required mod assist to place w/c at sink to perform upper body bathing/dressing due to impaired problem-solving and confusion.   Patient then  performed 3 sit-stands from w/c after arm rests were reversed to improve mechanical advantage but he was unable to transfer his hands  from armrests to sink for support.  Patient completed upper body bathing and then requested setup assist to perform grooming (shaving) .    Patient left at sink in w/c, still grooming, call light placed within reach for assistance.  See FIM for current functional status  Therapy/Group: Individual Therapy  [place] 04/22/2013, 12:39 PM

## 2013-04-22 NOTE — Progress Notes (Signed)
CBGs on1/14: 57/77-110-158-156-123 mg/dl     1/53: 79/43-276-147 mg/dl  Continues to have low blood sugars in the AM.   Lantus was discontinued, but will probably need some basal insulin.  Recommend continuing Lantus 40 units daily (only 1 dose a day) Could receive Lantus dose at bedtime. Will continue to follow while in hospital.  Smith Mince RN BSN CDE

## 2013-04-23 ENCOUNTER — Inpatient Hospital Stay (HOSPITAL_COMMUNITY): Payer: Medicare HMO | Admitting: Physical Therapy

## 2013-04-23 ENCOUNTER — Encounter: Payer: Medicare HMO | Admitting: Vascular Surgery

## 2013-04-23 ENCOUNTER — Inpatient Hospital Stay (HOSPITAL_COMMUNITY): Payer: Medicare HMO

## 2013-04-23 ENCOUNTER — Inpatient Hospital Stay (HOSPITAL_COMMUNITY): Payer: Medicare HMO | Admitting: *Deleted

## 2013-04-23 DIAGNOSIS — I739 Peripheral vascular disease, unspecified: Secondary | ICD-10-CM

## 2013-04-23 DIAGNOSIS — I10 Essential (primary) hypertension: Secondary | ICD-10-CM

## 2013-04-23 DIAGNOSIS — E1165 Type 2 diabetes mellitus with hyperglycemia: Secondary | ICD-10-CM

## 2013-04-23 DIAGNOSIS — S88119A Complete traumatic amputation at level between knee and ankle, unspecified lower leg, initial encounter: Secondary | ICD-10-CM

## 2013-04-23 DIAGNOSIS — IMO0001 Reserved for inherently not codable concepts without codable children: Secondary | ICD-10-CM

## 2013-04-23 DIAGNOSIS — I4891 Unspecified atrial fibrillation: Secondary | ICD-10-CM

## 2013-04-23 DIAGNOSIS — L98499 Non-pressure chronic ulcer of skin of other sites with unspecified severity: Secondary | ICD-10-CM

## 2013-04-23 LAB — GLUCOSE, CAPILLARY
GLUCOSE-CAPILLARY: 68 mg/dL — AB (ref 70–99)
GLUCOSE-CAPILLARY: 238 mg/dL — AB (ref 70–99)
Glucose-Capillary: 76 mg/dL (ref 70–99)
Glucose-Capillary: 119 mg/dL — ABNORMAL HIGH (ref 70–99)
Glucose-Capillary: 206 mg/dL — ABNORMAL HIGH (ref 70–99)

## 2013-04-23 NOTE — Progress Notes (Signed)
Occupational Therapy Session Note  Patient Details  Name: Leroy [name] MRN: [id number] Date of Birth: Nov 20, 1945  Today's Date: 04/23/2013 Time: 0900-1000 Time Calculation (min): 60 min  Short Term Goals: Week 1:  OT Short Term Goal 1 (Week 1): Patient will complete bathing and dressing upper body seated with supervision and setup. OT Short Term Goal 2 (Week 1): Patient will complete lower body bathing using AE, prn, with min assist OT Short Term Goal 3 (Week 1): Patient will complete functional transfer, bed <> w/c, with mod assist OT Short Term Goal 4 (Week 1): Patient will complete toileting using LRAD with min assist for thoroughness  Skilled Therapeutic Interventions: ADL-retraining with focus on slide board transfer (bed > w/c), sit <> stand at sink, supported dynamic standing balance during ADL, and squat-pivot transfer to DA bariatric BSC over toilet.  Patient completed transfer from bed to w/c with min assist (steadying) using slide board and verbal/tactile cues to place board and progress through transfer incrementally.   At sink, patient completed upper body ADL with only setup assist and stood to wash peri-area while supporting himself with each arm, alternating, on sink.   Patient was unable to reach buttocks this session and was educated on lateral leans as alternate method.   After assist with lower body dressing, patient attempted dry transfer from w/c to DA bariatric BSC over toilet, completing assisted squat-pivot transfer with mod assist (pt = 75%).       Therapy Documentation Precautions:  Precautions Precautions: Fall Precaution Comments: reports falling at home 3-4 times in 6 months. Often falls backwards when using rollator walker particularly on ramp Restrictions Weight Bearing Restrictions: No Other Position/Activity Restrictions: No weight bearing orders, anticipate pt non-weight bearing through amputated extremity will receive clarification.  Pain: Pain  Assessment Pain Assessment: No Pain  See FIM for current functional status  Therapy/Group: Individual Therapy  Second session: Time: 8811-0315 Time Calculation (min):  45 min  Pain Assessment: 5/10, right LE pain, meds  Skilled Therapeutic Interventions: Therex with focus on UE strengthening, endurance, w/c mobility.   Patient performed seated PRE using dumbell (5 lbs) and universal gym to enhance performance with transfers and standing balance.  Patient completed 15-20 reps each of shoulder pull-downs, shoulder press, bicep curls, seated rows with hand guidance to perform exercises correctly and to maintain positioning.  See FIM for current functional status  Therapy/Group: Individual Therapy  Legacy Lacivita 04/23/2013, 12:53 PM

## 2013-04-23 NOTE — Progress Notes (Addendum)
[facility] Progress Note  04/23/2013 2:02 PM   Subjective:  C/o of some pain in his right BKA stump that comes and goes.    Filed Vitals:   04/23/13 0517  BP: 114/85  Pulse: 84  Temp: 98.2 F (36.8 C)  Resp: 17    Physical Exam: Incisions:  right groin wound still with fibrinous tissue at the base of wound-granulation tissue around periphery of wound Extremities:  Right BKA stump healing nicely.  There is a very small area of separation just lateral to the mid portion of the incision.  No infection/drainage noted.  CBC    Component Value Date/Time   WBC 11.4* 04/22/2013 0504   RBC 3.86* 04/22/2013 0504   HGB 11.6* 04/22/2013 0504   HCT 35.1* 04/22/2013 0504   PLT 387 04/22/2013 0504   MCV 90.9 04/22/2013 0504   MCH 30.1 04/22/2013 0504   MCHC 33.0 04/22/2013 0504   RDW 13.1 04/22/2013 0504   LYMPHSABS 2.0 04/22/2013 0504   MONOABS 1.0 04/22/2013 0504   EOSABS 0.2 04/22/2013 0504   BASOSABS 0.0 04/22/2013 0504    BMET    Component Value Date/Time   NA 138 04/22/2013 0504   K 4.3 04/22/2013 0504   CL 100 04/22/2013 0504   CO2 23 04/22/2013 0504   GLUCOSE 122* 04/22/2013 0504   BUN 12 04/22/2013 0504   CREATININE 0.95 04/22/2013 0504   CALCIUM 8.7 04/22/2013 0504   GFRNONAA 85* 04/22/2013 0504   GFRAA >90 04/22/2013 0504    INR    Component Value Date/Time   INR 1.17 04/12/2013 2155   INR 1.6 04/25/2010 0904     Intake/Output Summary (Last 24 hours) at 04/23/13 1402 Last data filed at 04/23/13 0730  Gross per 24 hour  Intake    510 ml  Output   2300 ml  Net  -1790 ml     Assessment:  68 y.o. male is s/p:  Right BKA and right groin wound debridement on 04/14/13  Plan: -right groin wound still with fibrinous tissue at the base of wound-granulation tissue around periphery of wound-continue wound vac -pt doing well in rehab -continue current wound care.   Doreatha MassedSamantha Rhyne, PA-C [facility] [phone] 04/23/2013 2:02  PM  Addendum  I have independently interviewed and examined the patient, and I agree with the physician assistant's findings.  I think this patient's right groin would be better treated with a VAC.  I think wet-to-dry dressings would be more difficult for this patient with an active rehab schedule.  I will check again in two weeks if the central area of ischemic fat has resolved.  Leonides SakeBrian Oneal Schoenberger, MD [facility] [place] Office: [phone] Pager: [phone]  04/23/2013, 4:56 PM

## 2013-04-23 NOTE — Progress Notes (Signed)
Subjective/Complaints: Had an episode of incontinent stool last night. Pain better. Slept better overall. Sugars low again this am A 12 point review of systems has been performed and if not noted above is otherwise negative.   Objective: Vital Signs: Blood pressure 114/85, pulse 84, temperature 98.2 F (36.8 C), temperature source Oral, resp. rate 17, SpO2 99.00%. No results found.  Recent Labs  04/20/13 1500 04/22/13 0504  WBC 10.2 11.4*  HGB 10.8* 11.6*  HCT 32.2* 35.1*  PLT 309 387    Recent Labs  04/22/13 0504  NA 138  K 4.3  CL 100  GLUCOSE 122*  BUN 12  CREATININE 0.95  CALCIUM 8.7   CBG (last 3)   Recent Labs  04/22/13 2041 04/23/13 0715 04/23/13 0751  GLUCAP 199* 68* 76    Wt Readings from Last 3 Encounters:  04/12/13 129.4 kg (285 lb 4.4 oz)  04/12/13 129.4 kg (285 lb 4.4 oz)  04/12/13 129.4 kg (285 lb 4.4 oz)    Physical Exam:  Constitutional: He is oriented to person, place, and time. Obese african [place] male in no distress  HENT: dentition poor  Head: Normocephalic.  Eyes: EOM are normal.  Neck: Normal range of motion. Neck supple. No thyromegaly present.  Cardiovascular: Normal rate and regular rhythm. No murmurs or rubs  Respiratory: Effort normal and breath sounds normal except for a few expiratory wheezes. No respiratory distress.  GI: Soft. Bowel sounds are normal. He exhibits no distension.  Neurological: He is alert and oriented to person, place, and time. CN exam normal. Good insight and awareness. UES 5/5 proximal to distal. LLE -HF and KE 4/5. RLE HF and KE 2+ to 3-. No gross sensory deficits.  Skin:  Right BKA site staples intact with no drainage/ ACE in place. Left BKA site well healed/ pt wearing prosthesis. Vac in place right groin   Assessment/Plan: 1. Functional deficits secondary to new right BKA, recent left BKA which require 3+ hours per day of interdisciplinary therapy in a comprehensive inpatient rehab  setting. Physiatrist is providing close team supervision and 24 hour management of active medical problems listed below. Physiatrist and rehab team continue to assess barriers to discharge/monitor patient progress toward functional and medical goals. FIM: FIM - Bathing Bathing: 0: Activity did not occur  FIM - Upper Body Dressing/Undressing Upper body dressing/undressing: 0: Activity did not occur FIM - Lower Body Dressing/Undressing Lower body dressing/undressing: 0: Activity did not occur        FIM - Bed/Chair Transfer Bed/Chair Transfer Assistive Devices: Prosthesis;Sliding board Bed/Chair Transfer: 0: Activity did not occur  FIM - Locomotion: Wheelchair Distance: 75 Locomotion: Wheelchair: 2: Travels 50 - 149 ft with supervision, cueing or coaxing FIM - Locomotion: Ambulation Locomotion: Ambulation: 0: Activity did not occur  Comprehension Comprehension Mode: Auditory Comprehension: 5-Follows basic conversation/direction: With extra time/assistive device  Expression Expression Mode: Verbal Expression: 5-Expresses basic needs/ideas: With extra time/assistive device  Social Interaction Social Interaction: 5-Interacts appropriately 90% of the time - Needs monitoring or encouragement for participation or interaction.  Problem Solving Problem Solving: 4-Solves basic 75 - 89% of the time/requires cueing 10 - 24% of the time  Memory Memory: 6-More than reasonable amt of time  Medical Problem List and Plan:  1. 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 DVT Prophylaxis/Anticoagulation: Xarelto as directed  3. Pain Management: Oxycodone as needed. Monitor with increased mobility   -added gabapentin for phantom  limb pain which helped 4. Neuropsych: This patient is capable of making decisions on his own behalf.  5. 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 Patient uses a prosthesis  but there were some issues with fit PTA   -have consulted prosthetist for fit of right bk pros 6. Hypertension. Lisinopril 5 mg daily, Toprol 50 mg twice a day. Monitor with increased mobility  7. Diabetes mellitus with peripheral neuropathy. Hemoglobin A1c 12.4. SSI.   - Check CBGs a.c. and at bedtime. Provide full diabetic counseling   -sugars low again this am.  Liberalize diet. Hs snack 8. PAF.Xarelto. Cardiac rate controlled. Continue Lopressor  9. Hyperlipidemia. Zetia  10. Poor medical compliance. Provide counseling   LOS (Days) 2 A FACE TO FACE EVALUATION WAS PERFORMED  Leroy [name] T 04/23/2013 8:17 AM

## 2013-04-23 NOTE — Progress Notes (Signed)
Physical Therapy Session Note  Patient Details  Name: Leroy [name] MRN: [id number] Date of Birth: 1945/05/25  Today's Date: 04/23/2013 Time: 1000-1105 Time Calculation (min): 65 min  Short Term Goals: Week 1:  PT Short Term Goal 1 (Week 1): Pt will transfer bed<> chair with moderate assistance.  PT Short Term Goal 2 (Week 1): Pt will perform 1 min continuous standing with +2 assistance or mechanical lift PT Short Term Goal 3 (Week 1): Pt will perform supine>sit with min assist.  Skilled Therapeutic Interventions/Progress Updates:    Pt needed encouragement to end social conversation on phone for therapy. Wheelchair propulsion  X 60' with supervision, increased time. Sliding board transfer with pt able to place board with mod verbal cues, min assist to progress across board to level surface. Sit <> stands from elevated mat mod assist, +2 for safety. Forward/backwards hop with RW for support, +2 for safety. Stand pivot transfer mat>wheelchair with RW, +2 for safety overall mod assist and mod verbal cues for sequencing, assist for RW progression. Stressed importance of pressure relief, practiced 10 w/c pushups. Sit <> stands from wheelchair, increased difficulty with transition of UEs from chair to RW, performed with heavy max assist. Fitted with improved amputee pad for wheelchair. Prosthetist present at end of session.   Therapy Documentation Precautions:  Precautions Precautions: Fall Precaution Comments: reports falling at home 3-4 times in 6 months. Often falls backwards when using rollator walker particularly on ramp Restrictions Weight Bearing Restrictions: No Other Position/Activity Restrictions: No weight bearing orders, anticipate pt non-weight bearing through amputated extremity will receive clarification. Pain: Pain Assessment Pain Assessment: 0-10 Pain Score: 5  Pain Type: Surgical pain Pain Location: Leg Pain Orientation: Right Pain Descriptors / Indicators: Aching Pain  Frequency: Constant Pain Onset: Gradual   See FIM for current functional status  Therapy/Group: Individual Therapy  Wilhemina BonitoGillispie, Leroy [name] 04/23/2013, 12:29 PM

## 2013-04-23 NOTE — Significant Event (Signed)
CBG 68.  Patient asymptomatic.  Gave patient sprite, now eating breakfast.  Rechecked blood sugar; now 76.  Will continue to monitor patient.  Dani Gobble, RN

## 2013-04-23 NOTE — Progress Notes (Signed)
Orthopedic Tech Progress Note Patient Details:  Leroy GriffithsWilliam J [name] 05-Sep-1945 [id number]  Patient ID: Leroy GriffithsWilliam J [name], male   DOB: 05-Sep-1945, 68 y.o.   MRN: [id number] Called in advanced prosthetics brace order ; spoke with Leroy BollmanShameka  Leroy [name] 04/23/2013, 9:58 AM

## 2013-04-23 NOTE — IPOC Note (Signed)
Overall Plan of Care [facility]) Patient Details Name: Leroy [name] MRN: [id number] DOB: 1945-08-29  Admitting Diagnosis: RT BKA  Hospital Problems: Active Problems:   S/P bilateral BKA (below knee amputation)     Functional Problem List: Nursing Medication Management;Pain;Skin Integrity  PT Balance;Endurance;Pain;Safety  OT Balance;Endurance;Pain;Perception;Safety  SLP    TR         Basic ADL's: OT Eating;Bathing;Dressing;Toileting     Advanced  ADL's: OT       Transfers: PT Bed Mobility;Bed to Chair;Car  OT Toilet;Tub/Shower     Locomotion: PT Ambulation;Wheelchair Mobility     Additional Impairments: OT None  SLP        TR      Anticipated Outcomes Item Anticipated Outcome  Self Feeding I  Swallowing      Basic self-care  Mod I  Toileting  Mod I   Bathroom Transfers Mod I  Bowel/Bladder  Remain continent of bowel and bladder with min assist  Transfers  Supervision/min assist  Locomotion  Wheelchair level, supervision  Communication     Cognition     Pain  < 3  Safety/Judgment  supervision   Therapy Plan: PT Intensity: Minimum of 1-2 x/day ,45 to 90 minutes PT Frequency: 5 out of 7 days PT Duration Estimated Length of Stay: 16 days OT Intensity: Minimum of 1-2 x/day, 45 to 90 minutes OT Frequency: 5 out of 7 days OT Duration/Estimated Length of Stay: 14-16 days         Team Interventions: Nursing Interventions Patient/Family Education;Disease Management/Prevention;Pain Management;Medication Management;Skin Care/Wound Management  PT interventions Ambulation/gait training;Balance/vestibular training;Cognitive remediation/compensation;Discharge planning;Community reintegration;DME/adaptive equipment instruction;Neuromuscular re-education;Psychosocial support;Pain management;Patient/family education;Therapeutic Activities;Therapeutic Exercise;UE/LE Coordination activities;UE/LE Strength taining/ROM;Wheelchair propulsion/positioning  OT  Interventions Balance/vestibular training;Self Care/advanced ADL retraining;Functional mobility training;Wheelchair propulsion/positioning;Therapeutic Activities;Therapeutic Exercise;Patient/family education;DME/adaptive equipment instruction;Discharge planning;Pain management  SLP Interventions    TR Interventions    SW/CM Interventions      Team Discharge Planning: Destination: PT-Home (as long as 24 hour supervision available) ,OT- Home , SLP-  Projected Follow-up: PT-Home health PT;24 hour supervision/assistance, OT-  Home health OT, SLP-  Projected Equipment Needs: PT-To be determined, OT- To be determined, SLP-  Equipment Details: PT- , OT-  Patient/family involved in discharge planning: PT- Patient,  OT-Patient, SLP-   MD ELOS: 14 DAYS Medical Rehab Prognosis:  Excellent Assessment: The patient has been admitted for CIR therapies. The team will be addressing, functional mobility, strength, stamina, balance, safety, adaptive techniques/equipment, self-care, bowel and bladder mgt, patient and caregiver education, prosthetic ed, wound care, pain mgt. Goals have been set at mod I for basic self-care and supervision to min assist with transfers and locomotion.Ranelle Oyster, MD, FAAPMR      See Team Conference Notes for weekly updates to the plan of care

## 2013-04-23 NOTE — Progress Notes (Signed)
Physical Therapy Session Note  Patient Details  Name: Leroy [name] MRN: [id number] Date of Birth: 1945-08-30  Today's Date: 04/23/2013 Time: 1320-1350 Time Calculation (min): 30 min  Short Term Goals: Week 1:  PT Short Term Goal 1 (Week 1): Pt will transfer bed<> chair with moderate assistance.  PT Short Term Goal 2 (Week 1): Pt will perform 1 min continuous standing with +2 assistance or mechanical lift PT Short Term Goal 3 (Week 1): Pt will perform supine>sit with min assist.  Skilled Therapeutic Interventions/Progress Updates:  Tx focused on functional mobility, standing tolerance and therex in // bars, and pt education for self-care.  Pt resting in bed. Supervision supine>sit.  Sliding board transfer with Max A for steadying, advancing hips, and Wc safety. Pt needing cues for technique, but continued to attempt standing, rather than sliding.  Sit<>stand in // bars 1x90sec and 2x76min with min A for lifting/steadying and guarding L knee.  Pt performed R hip ex including ABD, EXT, FLX, and knee flx/ext x10 each in standing. Seated rests needed bt trials.  Pt propelled WC x75' with bil UEs before fatigue.  Sliding board transfer same back to bed, but with safer sliding technique. with mod A sit>supine for proper alignment. Bed alarm on and all needs in reach.      Therapy Documentation Precautions:  Precautions Precautions: Fall Precaution Comments: reports falling at home 3-4 times in 6 months. Often falls backwards when using rollator walker particularly on ramp Restrictions Weight Bearing Restrictions: No Other Position/Activity Restrictions: No weight bearing orders, anticipate pt non-weight bearing through amputated extremity will receive clarification.  Pain: none   Locomotion : Wheelchair Mobility Distance: 75   See FIM for current functional status  Therapy/Group: Individual Therapy Clydene Laming, PT, DPT 04/23/2013, 4:09 PM

## 2013-04-24 DIAGNOSIS — I739 Peripheral vascular disease, unspecified: Secondary | ICD-10-CM

## 2013-04-24 DIAGNOSIS — L98499 Non-pressure chronic ulcer of skin of other sites with unspecified severity: Secondary | ICD-10-CM

## 2013-04-24 DIAGNOSIS — E1165 Type 2 diabetes mellitus with hyperglycemia: Secondary | ICD-10-CM

## 2013-04-24 DIAGNOSIS — IMO0001 Reserved for inherently not codable concepts without codable children: Secondary | ICD-10-CM

## 2013-04-24 DIAGNOSIS — S88119A Complete traumatic amputation at level between knee and ankle, unspecified lower leg, initial encounter: Secondary | ICD-10-CM

## 2013-04-24 DIAGNOSIS — I4891 Unspecified atrial fibrillation: Secondary | ICD-10-CM

## 2013-04-24 DIAGNOSIS — I10 Essential (primary) hypertension: Secondary | ICD-10-CM

## 2013-04-24 LAB — GLUCOSE, CAPILLARY
GLUCOSE-CAPILLARY: 140 mg/dL — AB (ref 70–99)
GLUCOSE-CAPILLARY: 182 mg/dL — AB (ref 70–99)
Glucose-Capillary: 132 mg/dL — ABNORMAL HIGH (ref 70–99)
Glucose-Capillary: 219 mg/dL — ABNORMAL HIGH (ref 70–99)

## 2013-04-24 NOTE — Progress Notes (Signed)
Wound VAC in place to right groin. Zero drainage in canister in past 12 hours. Ace wrap to right BKA.  Patient reports unable to financially afford meds after discharge. Continent small BM on bedpan this AM. Using urinal with staff emptying. Alfredo Martinez A

## 2013-04-24 NOTE — Progress Notes (Signed)
Subjective/Complaints: Appreciate VVS note A 12 point review of systems has been performed and if not noted above is otherwise negative.   Objective: Vital Signs: Blood pressure 138/74, pulse 86, temperature 98.4 F (36.9 C), temperature source Oral, resp. rate 18, SpO2 97.00%. No results found.  Recent Labs  04/22/13 0504  WBC 11.4*  HGB 11.6*  HCT 35.1*  PLT 387    Recent Labs  04/22/13 0504  NA 138  K 4.3  CL 100  GLUCOSE 122*  BUN 12  CREATININE 0.95  CALCIUM 8.7   CBG (last 3)   Recent Labs  04/23/13 1637 04/23/13 2116 04/24/13 0735  GLUCAP 206* 238* 132*    Wt Readings from Last 3 Encounters:  04/12/13 129.4 kg (285 lb 4.4 oz)  04/12/13 129.4 kg (285 lb 4.4 oz)  04/12/13 129.4 kg (285 lb 4.4 oz)    Physical Exam:  Constitutional: He is oriented to person, place, and time. Obese african [place] male in no distress  HENT: dentition poor  Head: Normocephalic.  Eyes: EOM are normal.  Neck: Normal range of motion. Neck supple. No thyromegaly present.  Cardiovascular: Normal rate and regular rhythm. No murmurs or rubs  Respiratory: Effort normal and breath sounds normal except for a few expiratory wheezes. No respiratory distress.  GI: Soft. Bowel sounds are normal. He exhibits no distension.  Neurological: He is alert and oriented to person, place, and time. CN exam normal. Good insight and awareness. UES 5/5 proximal to distal. LLE -HF and KE 4/5. RLE HF and KE 2+ to 3-. No gross sensory deficits.  Skin:  Right BKA site staples intact with no drainage/ ACE in place. Left BKA site well healed/ pt wearing prosthesis. Vac in place right groin   Assessment/Plan: 1. Functional deficits secondary to new right BKA, recent left BKA which require 3+ hours per day of interdisciplinary therapy in a comprehensive inpatient rehab setting. Physiatrist is providing close team supervision and 24 hour management of active medical problems listed below. Physiatrist  and rehab team continue to assess barriers to discharge/monitor patient progress toward functional and medical goals. FIM: FIM - Bathing Bathing Steps Patient Completed: Chest;Right Arm;Left Arm;Abdomen;Front perineal area;Right upper leg;Left upper leg Bathing: 4: Min-Patient completes 8-9 72f 10 parts or 75+ percent  FIM - Upper Body Dressing/Undressing Upper body dressing/undressing steps patient completed: Thread/unthread right sleeve of pullover shirt/dresss;Thread/unthread left sleeve of pullover shirt/dress;Put head through opening of pull over shirt/dress;Pull shirt over trunk Upper body dressing/undressing: 5: Set-up assist to: Obtain clothing/put away FIM - Lower Body Dressing/Undressing Lower body dressing/undressing: 2: Max-Patient completed 25-49% of tasks     FIM - Diplomatic Services operational officer Devices: Bedside commode;Grab bars Toilet Transfers: 3-To toilet/BSC: Mod A (lift or lower assist);3-From toilet/BSC: Mod A (lift or lower assist)  FIM - Banker Devices: Sliding board;Bed rails Bed/Chair Transfer: 3: Sit > Supine: Mod A (lifting assist/Pt. 50-74%/lift 2 legs);2: Bed > Chair or W/C: Max A (lift and lower assist);2: Chair or W/C > Bed: Max A (lift and lower assist)  FIM - Locomotion: Wheelchair Distance: 75 Locomotion: Wheelchair: 2: Travels 50 - 149 ft with supervision, cueing or coaxing FIM - Locomotion: Ambulation Locomotion: Ambulation Assistive Devices: Walker - Rolling;Prosthesis Locomotion: Ambulation: 0: Activity did not occur  Comprehension Comprehension Mode: Auditory Comprehension: 4-Understands basic 75 - 89% of the time/requires cueing 10 - 24% of the time  Expression Expression Mode: Verbal Expression: 4-Expresses basic 75 - 89% of the time/requires cueing  10 - 24% of the time. Needs helper to occlude trach/needs to repeat words.  Social Interaction Social Interaction: 5-Interacts  appropriately 90% of the time - Needs monitoring or encouragement for participation or interaction.  Problem Solving Problem Solving: 3-Solves basic 50 - 74% of the time/requires cueing 25 - 49% of the time  Memory Memory: 3-Recognizes or recalls 50 - 74% of the time/requires cueing 25 - 49% of the time  Medical Problem List and Plan:  1. 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 DVT Prophylaxis/Anticoagulation: Xarelto as directed  3. Pain Management: Oxycodone as needed. Monitor with increased mobility   -added gabapentin for phantom limb pain which helped 4. Neuropsych: This patient is capable of making decisions on his own behalf.  5. 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 Patient uses a prosthesis but there were some issues with fit PTA   -have consulted prosthetist for fit of right bk pros 6. Hypertension. Lisinopril 5 mg daily, Toprol 50 mg twice a day. Monitor with increased mobility  7. Diabetes mellitus with peripheral neuropathy. Hemoglobin A1c 12.4. SSI.   - Check CBGs a.c. and at bedtime. Provide full diabetic counseling   -sugars low again this am.  Liberalize diet. Hs snack 8. PAF.Xarelto. Cardiac rate controlled. Continue Lopressor  9. Hyperlipidemia. Zetia  10. Poor medical compliance. Provide counseling   LOS (Days) 3 A FACE TO FACE EVALUATION WAS PERFORMED  Onyinyechi Huante E 04/24/2013 8:25 AM

## 2013-04-25 ENCOUNTER — Inpatient Hospital Stay (HOSPITAL_COMMUNITY): Payer: Medicare HMO

## 2013-04-25 ENCOUNTER — Inpatient Hospital Stay (HOSPITAL_COMMUNITY): Payer: Medicare HMO | Admitting: Occupational Therapy

## 2013-04-25 LAB — GLUCOSE, CAPILLARY
Glucose-Capillary: 160 mg/dL — ABNORMAL HIGH (ref 70–99)
Glucose-Capillary: 187 mg/dL — ABNORMAL HIGH (ref 70–99)
Glucose-Capillary: 249 mg/dL — ABNORMAL HIGH (ref 70–99)
Glucose-Capillary: 246 mg/dL — ABNORMAL HIGH (ref 70–99)

## 2013-04-25 NOTE — Progress Notes (Signed)
Wound VAC to right groin with min.to no drainage in collection cannister. Without  Complaint of. Alfredo Martinez A

## 2013-04-25 NOTE — Progress Notes (Signed)
Social Work  Social Work Assessment and Plan  Patient Details  Name: Leroy [name] MRN: [id number] Date of Birth: Jan 27, 1946  Today's Date: 04/25/2013  Problem List:  Patient Active Problem List   Diagnosis Date Noted  . S/P bilateral BKA (below knee amputation) 04/21/2013  . Chronic systolic heart failure 04/15/2013  . Discoloration of skin of foot-Right  04/12/2013  . Atherosclerosis of native arteries of the extremities with gangrene 04/12/2013  . Gangrene of foot 04/12/2013  . 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 Critical lower limb ischemia 03/09/2013  . Foot pain 03/09/2013  . Peripheral vascular disease, unspecified 11/06/2012  . Unilateral complete BKA 09/10/2012  . SVT (supraventricular tachycardia) 09/02/2012  . Onychomycosis of toenail 01/02/2012  . Hyperlipidemia, familial, high LDL 01/02/2012  . Type II or unspecified type diabetes mellitus with neurological manifestations, uncontrolled(250.62) 05/03/2010  . CAD 04/13/2010  . CARDIOMYOPATHY 04/13/2010  . ATRIAL FIBRILLATION, PAROXYSMAL 04/13/2010   Past Medical History:  Past Medical History  Diagnosis Date  . Hyperlipidemia   . Ischemic cardiomyopathy     a. EF 40% 2010. b. Echo (2/14):  mild LVH, EF 30-35%, diff HK, Gr 1 DD, MAC, mild MR, mod LAE, PASP 39  . Cataract   . Hypotension   . Paroxysmal atrial fibrillation   . Peripheral vascular disease     a. s/p L BKA 2014.  [name] History of blood clots     in legs--this was in 2011--has been off of Coumadin 80months;takes Xarelto daily  . Peripheral neuropathy   . Joint pain   . H/O hiatal hernia   . Chronic kidney disease     on Lisinopril to protect kidneys d/t being on Metformin per pt  . Myocardial infarction     total of 8 heart attacks;last one in 2011  . Atrial flutter   . H/O medication noncompliance     Due to insurance issues  . Chronic systolic CHF (congestive heart failure)   . Coronary artery disease     a. s/p remote CABG 2001 at [facility] b.  Cath 03/2010: for med rx.;  c.  [name] myoview (1/14):  Large Inferior apical MI, very small area of peri-infarct ischemia toward the inferior apical segment. EF 19%.  Med Rx was continued.    . Diabetes mellitus 1979    takes Metformni daily  and Lantus 50units bid   Past Surgical History:  Past Surgical History  Procedure Laterality Date  . Revasculariztion  2011/2010    x 2   . Coronary artery bypass graft  03/2000    quadruple  . Tonsillectomy    . Hernia repair    . Cardiac catheterization  03/19/10  . Abdominal aortagram  04-30-12  . Adenoidectomy    . Amputation Left 06/03/2012    Procedure: AMPUTATION BELOW KNEE;  Surgeon: [name], MD;  Location: [facility] OR;  Service: Vascular;  Laterality: Left;  . Coronary angioplasty  2002    3 stents  . Endarterectomy femoral Right 03/25/2013    Procedure: ENDARTERECTOMY FEMORAL ;  Surgeon: [name], MD;  Location: [facility] OR;  Service: Vascular;  Laterality: Right;  . Patch angioplasty Right 03/25/2013    Procedure: PATCH ANGIOPLASTY USING VASCU-GUARD PERIPHERAL VASCULAR PATCH;  Surgeon: [name], MD;  Location: [facility] OR;  Service: Vascular;  Laterality: Right;  . Amputation Right 04/14/2013    Procedure: AMPUTATION BELOW KNEE ;  Surgeon: [name], MD;  Location: [facility] OR;  Service: Vascular;  Laterality:  Right;  . I&d extremity Right 04/14/2013    Procedure: IRRIGATION AND DEBRIDEMENT OF RIGHT  GROIN & PLACEMENT OF VAC DRESSING;  Surgeon: Fransisco HertzBrian L Chen, MD;  Location: [facility] OR;  Service: Vascular;  Laterality: Right;   Social History:  reports that he has quit smoking. His smoking use included Cigarettes. He smoked 0.00 packs per day. He has never used smokeless tobacco. He reports that he does not drink alcohol or use illicit drugs.  Family / Support Systems Marital Status: Married How Long?: legally still married, however, has been separated x 18 yrs Patient Roles: Parent Children: pt reports he has 3 adult children with two living  locally.  Also a Starr Sinclairgodson, Michael, who is also local Other Supports: brother, Ranae Pilaony Lafever @ [phone]) [phone];  Verlee RossettiKimberly Awoyle, friend Anticipated Caregiver: Alinda Moneyony and his friend Selena BattenKim who likely will be moving in with pt Ability/Limitations of Caregiver: Alinda Moneyony works; Selena BattenKim is an exgirlfriend who he plan to let move back in again. She is a OceanographerCNA Caregiver Availability: Intermittent Family Dynamics: pt does not offer much information about his children and does not appear to plan on having them provide any assistance.  Quickly points out that his godson may assist some.  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  His friend, Selena BattenKim, is sitting at bedside and occasionally asks him if he has understood and he always says he has.  Flat affect.  Slightly indifferent to discussion regarding d/c plans.  Denies any emotional distress. No s/s noted.  Will monitor and perform formal depression screen if indicated. Recent Psychosocial Issues: First amputation less than a year ago.  Pt notes he was d/c'd to a SNF at that time.  Feels he was managing well at home after his d/c. Pyschiatric History: None Substance Abuse History: None  Patient / Family Perceptions, Expectations & Goals Pt/Family understanding of illness & functional limitations: Pt with basic understanding of medical issues  leading to this second BKA and of his need for therapies.  Friend seems to understand that he will likely need some assist at d/c. Premorbid pt/family roles/activities: Pt was living alone and independently PTA.  Managing household overall with occasional assist from friends and family. Anticipated changes in roles/activities/participation: If pt is able to reach mod i goals, then anticipating little change overall.  Friends/ family aware they may need to assist initially Pt/family expectations/goals: Pt hopeful he will reach mod i goals and be able to return to his own home.  Community Resources Premorbid Home Care/DME Agencies: Other (Comment) [facility] Transportation available at discharge: yes Resource referrals recommended: Support group (specify);Psychology  Discharge Planning Living Arrangements: Alone Support Systems: Children;Other relatives;Friends/neighbors Type of Residence: Private residence Insurance Resources: Medicare [facility] Financial Resources: Social Security Financial Screen Referred: No Living Expenses: Rent Money Management: Patient Does the patient have any problems obtaining your medications?: No Home Management: pt and family / friends Patient/Family Preliminary Plans: pt plans to return to his own home.  He anticipates help from friend, Selena Batten and she confirms she will help, however, does ask about his [facility] coverage of services.  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  This may end up affecting her willingness to provide assist Barriers to Discharge:  (ramped entrance already) Social Work Anticipated Follow Up Needs: HH/OP;Support Group Expected length of stay: 14-16 days  Clinical Impression Leroy [name] here following second BKA and offers little conversation beyond simple answers to basic questions.  Difficult to fully assess his understanding of potential d/c  planning support needs.  Does appear to be relying on his friend, Selena Batten to assist.  Actually has mod i goals, therefore, if no caregiver available then this may not be problematic.  Brother can assist with basic needs i.e. Groceries, transportation.  Will follow for support and d/c planning.  Leroy [name] 04/25/2013, 3:25 PM

## 2013-04-25 NOTE — Progress Notes (Signed)
Subjective/Complaints: No drainage from wound vac per RN No problems overnite, pain controlled A 12 point review of systems has been performed and if not noted above is otherwise negative.   Objective: Vital Signs: Blood pressure 109/56, pulse 84, temperature 98.8 F (37.1 C), temperature source Oral, resp. rate 20, SpO2 94.00%. No results found. No results found for this basename: WBC, HGB, HCT, PLT,  in the last 72 hours No results found for this basename: NA, K, CL, CO, GLUCOSE, BUN, CREATININE, CALCIUM,  in the last 72 hours CBG (last 3)   Recent Labs  04/24/13 1202 04/24/13 1711 04/24/13 2045  GLUCAP 140* 182* 219*    Wt Readings from Last 3 Encounters:  04/12/13 129.4 kg (285 lb 4.4 oz)  04/12/13 129.4 kg (285 lb 4.4 oz)  04/12/13 129.4 kg (285 lb 4.4 oz)    Physical Exam:  Constitutional: He is oriented to person, place, and time. Obese african [place] male in no distress  HENT: dentition poor  Head: Normocephalic.  Eyes: EOM are normal.  Neck: Normal range of motion. Neck supple. No thyromegaly present.  Cardiovascular: Normal rate and regular rhythm. No murmurs or rubs  Respiratory: Effort normal and breath sounds normal except for a few expiratory wheezes. No respiratory distress.  GI: Soft. Bowel sounds are normal. He exhibits no distension.  Neurological: He is alert and oriented to person, place, and time. CN exam normal. Good insight and awareness. UES 5/5 proximal to distal. LLE -HF and KE 4/5. RLE HF and KE 2+ to 3-. No gross sensory deficits.  Skin:  Right BKA site staples intact with no drainage/ ACE in place. Left BKA site well healed/ pt wearing prosthesis. Vac in place right groin   Assessment/Plan: 1. Functional deficits secondary to new right BKA, recent left BKA which require 3+ hours per day of interdisciplinary therapy in a comprehensive inpatient rehab setting. Physiatrist is providing close team supervision and 24 hour management of active  medical problems listed below. Physiatrist and rehab team continue to assess barriers to discharge/monitor patient progress toward functional and medical goals. FIM: FIM - Bathing Bathing Steps Patient Completed: Chest;Right Arm;Left Arm;Abdomen;Front perineal area;Right upper leg;Left upper leg Bathing: 4: Min-Patient completes 8-9 [id number] 10 parts or 75+ percent  FIM - Upper Body Dressing/Undressing Upper body dressing/undressing steps patient completed: Thread/unthread right sleeve of pullover shirt/dresss;Thread/unthread left sleeve of pullover shirt/dress;Put head through opening of pull over shirt/dress;Pull shirt over trunk Upper body dressing/undressing: 5: Set-up assist to: Obtain clothing/put away FIM - Lower Body Dressing/Undressing Lower body dressing/undressing: 2: Max-Patient completed 25-49% of tasks     FIM - Diplomatic Services operational officerToilet Transfers Toilet Transfers Assistive Devices: Bedside commode;Grab bars Toilet Transfers: 3-To toilet/BSC: Mod A (lift or lower assist);3-From toilet/BSC: Mod A (lift or lower assist)  FIM - BankerBed/Chair Transfer Bed/Chair Transfer Assistive Devices: Sliding board;Bed rails Bed/Chair Transfer: 3: Sit > Supine: Mod A (lifting assist/Pt. 50-74%/lift 2 legs);2: Bed > Chair or W/C: Max A (lift and lower assist);2: Chair or W/C > Bed: Max A (lift and lower assist)  FIM - Locomotion: Wheelchair Distance: 75 Locomotion: Wheelchair: 2: Travels 50 - 149 ft with supervision, cueing or coaxing FIM - Locomotion: Ambulation Locomotion: Ambulation Assistive Devices: Walker - Rolling;Prosthesis Locomotion: Ambulation: 0: Activity did not occur  Comprehension Comprehension Mode: Auditory Comprehension: 4-Understands basic 75 - 89% of the time/requires cueing 10 - 24% of the time  Expression Expression Mode: Verbal Expression: 4-Expresses basic 75 - 89% of the time/requires cueing 10 - 24% of  the time. Needs helper to occlude trach/needs to repeat words.  Social  Interaction Social Interaction: 5-Interacts appropriately 90% of the time - Needs monitoring or encouragement for participation or interaction.  Problem Solving Problem Solving: 3-Solves basic 50 - 74% of the time/requires cueing 25 - 49% of the time  Memory Memory: 3-Recognizes or recalls 50 - 74% of the time/requires cueing 25 - 49% of the time  Medical Problem List and Plan:  1. 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 DVT Prophylaxis/Anticoagulation: Xarelto as directed  3. Pain Management: Oxycodone as needed. Monitor with increased mobility   -added gabapentin for phantom limb pain which helped 4. Neuropsych: This patient is capable of making decisions on his own behalf.  5. 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 Patient uses a prosthesis but there were some issues with fit PTA   -have consulted prosthetist for fit of right bk pros 6. Hypertension. Lisinopril 5 mg daily, Toprol 50 mg twice a day. Monitor with increased mobility  7. Diabetes mellitus with peripheral neuropathy. Hemoglobin A1c 12.4. SSI.   - Check CBGs a.c. and at bedtime. Provide full diabetic counseling   -sugars low again this am.  Liberalize diet. Hs snack 8. PAF.Xarelto. Cardiac rate controlled. Continue Lopressor  9. Hyperlipidemia. Zetia  10. Poor medical compliance. Provide counseling   LOS (Days) 4 A FACE TO FACE EVALUATION WAS PERFORMED  Erick Colace 04/25/2013 6:58 AM

## 2013-04-25 NOTE — Progress Notes (Addendum)
Physical Therapy Session Note  Patient Details  Name: Leroy [name] MRN: [id number] Date of Birth: 04/23/1945  Today's Date: 04/25/2013 Time: 1100-1210 and 1520-1600 Time Calculation (min): 70 min and 40 min  Short Term Goals: Week 1:  PT Short Term Goal 1 (Week 1): Pt will transfer bed<> chair with moderate assistance.  PT Short Term Goal 2 (Week 1): Pt will perform 1 min continuous standing with +2 assistance or mechanical lift PT Short Term Goal 3 (Week 1): Pt will perform supine>sit with min assist.  Skilled Therapeutic Interventions/Progress Updates:  Treatment 1: Pt received in arm chair and agreeable to PT. Pt completed sliding board transfer from arm chair to w/c with mod A of 1 going to the left side with LLE prosthesis donned, with PT set-up and cues for hand placement, LLE placement safety during transfer. Pt reports "That felt a lot better than last time." Pt propelled w/c towards the gym 110 feet using BUE at supervision level and cues for safety and pacing. Pt completed standing trials is the parallel bars, requiring mod A for sit to stand/stand to sit transfers. Pt able to stand 60-90 seconds x3 trials. Pt completed exercises of the RLE in standing, including hip flexion, extension and abd, 2x10. Pt attempted to hop, but is not safe to at this point secondary to improper fitting of LLE prosthesis. Pt reports a visit from prosthetist on Friday. Pt complete ther ex in sitting of BLE, including LAQ, marching, hip abd, and hamstring curls; 3lb on the LLE and 1 lb on the RLE, 1x20. Pt propelled w/c back to room. Pt completed sliding board transfer from w/c back to arm chair towards right side with mod A and cues for proper hand placement and foot placement. Pt completed w/c push ups x3 to allow PT to reposition clothing that was moved during sliding board transfer. All needs met and within reach. Quick release belt in place.   Treatment 2: Pt received in arm chair. Pt completed sliding  board transfer from arm chair to w/c with min A and cues for safety and hand placement. Pt instructed in and performed w/c pushups 2x10. Pt educated on safety of pressure relief, including w/c push ups and weight distribution. Pt completed lateral scoot transfer from w/c to mat table with min /mod A and cues for hand and foot placement and overall safety awareness during transfer. Pt required increased time to complete transfer due to fatigue. Pt completed scooting exercises up/down mat table to promote increased strengthening of BUEs and to enhance technique of transfers. Pt able to then complete tsf from mat table to w/c with min A (lat scoot). Pt propelled w/c 60 feet x1 with BUE at sup toward room. Pt assisted back into recliner chair using lateral scoot transfer. All needs met and quick release belt in place.  Therapy Documentation Precautions:  Precautions Precautions: Fall Precaution Comments: reports falling at home 3-4 times in 6 months. Often falls backwards when using rollator walker particularly on ramp Restrictions Weight Bearing Restrictions: No Other Position/Activity Restrictions: No weight bearing orders, anticipate pt non-weight bearing through amputated extremity will receive clarification. Pain: Pain Assessment Pain Assessment: No/denies pain Pain Score: 0-No pain  See FIM for current functional status  Therapy/Group: Individual Therapy  Gretel Cantu R 04/25/2013, 12:45 PM

## 2013-04-25 NOTE — Progress Notes (Signed)
Occupational Therapy Session Note  Patient Details  Name: Leroy [name] MRN: [id number] Date of Birth: Nov 05, 1945  Today's Date: 04/25/2013  First session Time: 1345-1430 Time Calculation (min): 45 min  Skilled Therapeutic Interventions/Progress Updates: ADL in room EOB to wash periarea with lateral leans for buttocks.  Patient with great upper body strength and after lying back to wash buttocks  strongly slung himself back to upright position very close to EOB today.  Patient appeared slightly surprised when this clinician quickly moved to EOB to guard against patient slinging himself onto floor.  This clinician then asked patient to scoot back onto bed into a safer position.  Patient donned his prosthesis onto this left LE independently and completed a right side scoot into the recliner chair with cues to side scoot toward the back of the recliner for safer distance/positioning.  He completed the scoot with Mod A and cues.     Patient took approx 4 short rest breaks before transferring to the chair.   He sat in the recliner at the sink to complete his UB bathing and dressing and oral care with setup.  Second session:  1345-1430 Time:  45 min  Though patient stated he was fatigue, he completed endurance training and 7 lbs dumbell with each UE sitting in the recliner.  This will further increase patient endurance for complete self care and transfer tasks.  Therapy Documentation Precautions:  Precautions Precautions: Fall Precaution Comments: reports falling at home 3-4 times in 6 months. Often falls backwards when using rollator walker particularly on ramp Restrictions Weight Bearing Restrictions: No Other Position/Activity Restrictions: No weight bearing orders, anticipate pt non-weight bearing through amputated extremity will receive clarification.  Pain: 5/10 RN brought medications.   See FIM for current functional status  Therapy/Group: Individual Therapy  Bud Face  [facility] 04/25/2013, 5:47 PM

## 2013-04-26 ENCOUNTER — Inpatient Hospital Stay (HOSPITAL_COMMUNITY): Payer: Medicare HMO

## 2013-04-26 ENCOUNTER — Inpatient Hospital Stay (HOSPITAL_COMMUNITY): Payer: Medicare HMO | Admitting: Physical Therapy

## 2013-04-26 LAB — GLUCOSE, CAPILLARY
GLUCOSE-CAPILLARY: 176 mg/dL — AB (ref 70–99)
GLUCOSE-CAPILLARY: 200 mg/dL — AB (ref 70–99)
GLUCOSE-CAPILLARY: 157 mg/dL — AB (ref 70–99)
Glucose-Capillary: 240 mg/dL — ABNORMAL HIGH (ref 70–99)

## 2013-04-26 MED ORDER — GLIMEPIRIDE 1 MG PO TABS
1.0000 mg | ORAL_TABLET | Freq: Every day | ORAL | Status: DC
  Administered 2013-04-26: 1 mg via ORAL
  Filled 2013-04-26 (×3): qty 1

## 2013-04-26 NOTE — Progress Notes (Signed)
Occupational Therapy Session Note  Patient Details  Name: Leroy GriffithsWilliam J [name] MRN: [id number] Date of Birth: 1945/04/27  Today's Date: 04/26/2013 Time: 0730-0830 Time Calculation (min): 60 min  Short Term Goals: Week 1:  OT Short Term Goal 1 (Week 1): Patient will complete bathing and dressing upper body seated with supervision and setup. OT Short Term Goal 2 (Week 1): Patient will complete lower body bathing using AE, prn, with min assist OT Short Term Goal 3 (Week 1): Patient will complete functional transfer, bed <> w/c, with mod assist OT Short Term Goal 4 (Week 1): Patient will complete toileting using LRAD with min assist for thoroughness  Skilled Therapeutic Interventions: ADL-retraining with focus on functional transfers (bed, toilet), dynamic standing balance, safety awareness, and lower body dressing skills.   Patient now able to rise from supine to sitting at edge of bed unassisted.   Patient elected to attempt stand-pivot transfer from bed to w/c using RW and required only steadying assist for safety to secure w/c and manage wound vac.   Patient performed bathing at sink, sitting and standing to wash peri-area, with only min assist to wash buttocks.   Patient able to lace leg through right pant leg after min assist for dressing left leg due to use of prosthesis during transfers.   After completing  B & D, patient rested 3 minutes and performed [facility] with RN tech observing technique (min assist to steady and verbal cues for problem-solving).    Therapy Documentation Precautions:  Precautions Precautions: Fall Precaution Comments: reports falling at home 3-4 times in 6 months. Often falls backwards when using rollator walker particularly on ramp Restrictions Weight Bearing Restrictions: No Other Position/Activity Restrictions: No weight bearing orders, anticipate pt non-weight bearing through amputated extremity will receive clarification.  Vital  Signs: Therapy Vitals Temp: 98.4 F (36.9 C) Temp src: Oral Pulse Rate: 83 Resp: 19 BP: 142/84 mmHg Patient Position, if appropriate: Lying Oxygen Therapy SpO2: 95 % O2 Device: None (Room air)  Pain: Pain Assessment Pain Assessment: 0-10 Pain Score: 8  Pain Type: Phantom pain Pain Location: Foot Pain Orientation: Right Pain Descriptors / Indicators: Shooting Pain Frequency: Constant Pain Onset: Unable to tell Pain Intervention(s): Medication (See eMAR)  ADL: ADL ADL Comments:  (see FIM)  See FIM for current functional status  Therapy/Group: Individual Therapy  Second session: Time: [id number] Time Calculation (min): 45 min  Pain Assessment: No pain  Skilled Therapeutic Interventions:Therex with focus on UE (shoulder) strengthening, using dumbells (5 lb) and universal gym (40-60 lbs).   Patient performed lat pull downs, horizontal rows, bicep curls and shoulder presses, 20 reps each exercise.   From ortho gym patient was escorted to rehab gym to perform 10 min of seated UE - cardio exercising using SciFit, level 1.5.   See FIM for current functional status  Therapy/Group: Individual Therapy  Simonne Boulos 04/26/2013, 8:32 AM

## 2013-04-26 NOTE — Progress Notes (Signed)
[facility] Individual Statement of Services  Patient Name:  Leroy [name]  Date:  04/26/2013  Welcome to the [facility].  Our goal is to provide you with an individualized program based on your diagnosis and situation, designed to meet your specific needs.  With this comprehensive rehabilitation program, you will be expected to participate in at least 3 hours of rehabilitation therapies Monday-Friday, with modified therapy programming on the weekends.  Your rehabilitation program will include the following services:  Physical Therapy (PT), Occupational Therapy (OT), 24 hour per day rehabilitation nursing, Therapeutic Recreaction (TR), Case Management (Social Worker), Rehabilitation Medicine, Nutrition Services and Pharmacy Services  Weekly team conferences will be held on Tuesdays to discuss your progress.  Your Social Worker will talk with you frequently to get your input and to update you on team discussions.  Team conferences with you and your family in attendance may also be held.  Expected length of stay: 14-16 days  Overall anticipated outcome: modified independent  Depending on your progress and recovery, your program may change. Your Social Worker will coordinate services and will keep you informed of any changes. Your Social Worker's name and contact numbers are listed  below.  The following services may also be recommended but are not provided by the [facility]:   Driving Evaluations  Home Health Rehabiltiation Services  Outpatient Rehabilitation Services    Arrangements will be made to provide these services after discharge if needed.  Arrangements include referral to agencies that provide these services.  Your insurance has been verified to be:  [employer] [employer] Your primary doctor is:  Dr. [name]  Pertinent information will be shared with your doctor and your insurance company.  Social Worker:  [place]  Kameron Glazebrook, [place] [phone] or ([phone]) [phone]   Information discussed with and copy given to patient by: Amada JupiterHOYLE, [name], 04/26/2013, 2:19 PM

## 2013-04-26 NOTE — Progress Notes (Signed)
Physical Therapy Session Note  Patient Details  Name: Leroy [name] MRN: [id number] Date of Birth: Sep 13, 1945  Today's Date: 04/26/2013 Time: 0930-1030 Time Calculation (min): 60 min  Short Term Goals: Week 1:  PT Short Term Goal 1 (Week 1): Pt will transfer bed<> chair with moderate assistance.  PT Short Term Goal 2 (Week 1): Pt will perform 1 min continuous standing with +2 assistance or mechanical lift PT Short Term Goal 3 (Week 1): Pt will perform supine>sit with min assist.  Skilled Therapeutic Interventions/Progress Updates:    "I'm tired but doing good!" Ambulation hop gait x 3' (6 hops) with bariatric RW, +2 for safety. Squat pivot transfers x 4 all with min assist, stabilization of wheelchair needed. Core strengthening mini-crunches with wedge, red weighted ball added for difficulty. Wheelchair parts management and set-up for transfers mod verbal cues, min assist. Wants belt removed when up in chair, will discuss safety with team.  Therapy Documentation Precautions:  Precautions Precautions: Fall Precaution Comments: reports falling at home 3-4 times in 6 months. Often falls backwards when using rollator walker particularly on ramp Restrictions Weight Bearing Restrictions: No Other Position/Activity Restrictions: No weight bearing orders, anticipate pt non-weight bearing through amputated extremity will receive clarification.  Pain: Pain Assessment Pain Score: 4  premedicated  See FIM for current functional status  Therapy/Group: Individual Therapy  Wilhemina Bonito 04/26/2013, 12:28 PM

## 2013-04-26 NOTE — Progress Notes (Signed)
Inpatient Diabetes Program Recommendations  AACE/ADA: New Consensus Statement on Inpatient Glycemic Control (2013)  Target Ranges:  Prepandial:   less than 140 mg/dL      Peak postprandial:   less than 180 mg/dL (1-2 hours)      Critically ill patients:  140 - 180 mg/dL   Hyperglycemia following meals.  Pt home meds: Lantus 50 units bid and Humalog/Novolog 5 units tidwc.  Inpatient Diabetes Program Recommendations Insulin - Basal: xxx Correction (SSI): xxxx Insulin - Meal Coverage: Please add meal coverage: 5 units tidwc.   Thank you, Lenor CoffinAnn Sherrick Araki, RN, CNS, Diabetes Coordinator [phone])

## 2013-04-26 NOTE — Progress Notes (Signed)
Subjective/Complaints: Sore this am. Otherwise no complaints.  A 12 point review of systems has been performed and if not noted above is otherwise negative.   Objective: Vital Signs: Blood pressure 142/84, pulse 83, temperature 98.4 F (36.9 C), temperature source Oral, resp. rate 19, SpO2 95.00%. No results found. No results found for this basename: WBC, HGB, HCT, PLT,  in the last 72 hours No results found for this basename: NA, K, CL, CO, GLUCOSE, BUN, CREATININE, CALCIUM,  in the last 72 hours CBG (last 3)   Recent Labs  04/25/13 1655 04/25/13 2059 04/26/13 0729  GLUCAP 249* 246* 176*    Wt Readings from Last 3 Encounters:  04/12/13 129.4 kg (285 lb 4.4 oz)  04/12/13 129.4 kg (285 lb 4.4 oz)  04/12/13 129.4 kg (285 lb 4.4 oz)    Physical Exam:  Constitutional: He is oriented to person, place, and time. Obese african [place] male in no distress  HENT: dentition poor  Head: Normocephalic.  Eyes: EOM are normal.  Neck: Normal range of motion. Neck supple. No thyromegaly present.  Cardiovascular: Normal rate and regular rhythm. No murmurs or rubs  Respiratory: Effort normal and breath sounds normal except for a few expiratory wheezes. No respiratory distress.  GI: Soft. Bowel sounds are normal. He exhibits no distension.  Neurological: He is alert and oriented to person, place, and time. CN exam normal. Good insight and awareness. UES 5/5 proximal to distal. LLE -HF and KE 4/5. RLE HF and KE 2+ to 3-. No gross sensory deficits.  Skin:  Right BKA site staples intact with minimal to no drainage . Left BKA site well healed/ pt wearing prosthesis. Vac in place right groin   Assessment/Plan: 1. Functional deficits secondary to new right BKA, recent left BKA which require 3+ hours per day of interdisciplinary therapy in a comprehensive inpatient rehab setting. Physiatrist is providing close team supervision and 24 hour management of active medical problems listed  below. Physiatrist and rehab team continue to assess barriers to discharge/monitor patient progress toward functional and medical goals. FIM: FIM - Bathing Bathing Steps Patient Completed: Chest;Right Arm;Left Arm;Abdomen;Front perineal area;Buttocks;Right upper leg;Left upper leg Bathing: 5: Supervision: Safety issues/verbal cues  FIM - Upper Body Dressing/Undressing Upper body dressing/undressing steps patient completed: Thread/unthread right sleeve of pullover shirt/dresss;Pull shirt over trunk;Thread/unthread left sleeve of pullover shirt/dress;Put head through opening of pull over shirt/dress Upper body dressing/undressing: 5: Supervision: Safety issues/verbal cues FIM - Lower Body Dressing/Undressing Lower body dressing/undressing steps patient completed: Thread/unthread right underwear leg;Thread/unthread left underwear leg;Pull underwear up/down;Thread/unthread right pants leg;Thread/unthread left pants leg;Pull pants up/down;Fasten/unfasten pants Lower body dressing/undressing: 5: Set-up assist to: Obtain clothing  FIM - Toileting Toileting: 0: Activity did not occur  FIM - Diplomatic Services operational officer Devices: Bedside commode;Grab bars Toilet Transfers: 3-To toilet/BSC: Mod A (lift or lower assist);3-From toilet/BSC: Mod A (lift or lower assist)  FIM - Banker Devices: Sliding board;Arm rests;Prosthesis (L BKA prosthesis donned. ) Bed/Chair Transfer: 2: Chair or W/C > Bed: Max A (lift and lower assist);2: Bed > Chair or W/C: Max A (lift and lower assist)  FIM - Locomotion: Wheelchair Distance: 115 Locomotion: Wheelchair: 2: Travels 50 - 149 ft with supervision, cueing or coaxing FIM - Locomotion: Ambulation Locomotion: Ambulation Assistive Devices: Walker - Rolling;Prosthesis Locomotion: Ambulation: 0: Activity did not occur  Comprehension Comprehension Mode: Auditory Comprehension: 4-Understands basic 75 - 89% of the  time/requires cueing 10 - 24% of the time  Expression Expression Mode:  Verbal Expression: 4-Expresses basic 75 - 89% of the time/requires cueing 10 - 24% of the time. Needs helper to occlude trach/needs to repeat words.  Social Interaction Social Interaction: 5-Interacts appropriately 90% of the time - Needs monitoring or encouragement for participation or interaction.  Problem Solving Problem Solving: 3-Solves basic 50 - 74% of the time/requires cueing 25 - 49% of the time  Memory Memory: 3-Recognizes or recalls 50 - 74% of the time/requires cueing 25 - 49% of the time  Medical Problem List and Plan:  1. 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 DVT Prophylaxis/Anticoagulation: Xarelto as directed  3. Pain Management: Oxycodone as needed. Monitor with increased mobility   - gabapentin for phantom limb pain which helped 4. Neuropsych: This patient is capable of making decisions on his own behalf.  5. 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 Advanced to follow up for fit/ed 6. Hypertension. Lisinopril 5 mg daily, Toprol 50 mg twice a day. Monitor with increased mobility  7. Diabetes mellitus with peripheral neuropathy. Hemoglobin A1c 12.4. SSI.   - Check CBGs a.c. and at bedtime. Provide full diabetic counseling   -add low dose amaryl 1mg   8. PAF.Xarelto. Cardiac rate controlled. Continue Lopressor  9. Hyperlipidemia. Zetia  10. Poor medical compliance. Provide counseling   LOS (Days) 5 A FACE TO FACE EVALUATION WAS PERFORMED  Shubham Thackston T 04/26/2013 7:59 AM

## 2013-04-26 NOTE — Progress Notes (Signed)
Physical Therapy Session Note  Patient Details  Name: Leroy [name] MRN: [id number] Date of Birth: [birth year]  Today's Date: 04/26/2013 Time: 1430-1500 Time Calculation (min): 30 min  Short Term Goals: Week 1:  PT Short Term Goal 1 (Week 1): Pt will transfer bed<> chair with moderate assistance.  PT Short Term Goal 2 (Week 1): Pt will perform 1 min continuous standing with +2 assistance or mechanical lift PT Short Term Goal 3 (Week 1): Pt will perform supine>sit with min assist.  Skilled Therapeutic Interventions/Progress Updates:    Pt tired from OT - lifting weights. Wheelchair "sprints" down hallway x 150' with supervision. Sit <> stands x 5 from wheelchair practicing safe UE positioning and transition from w/c to RW. Stand pivot transfer with RW, min assist and cues for safe proximity of RW. Prosthesis donned entire session.  Lt leg rest adjusted for comfort.   Therapy Documentation Precautions:  Precautions Precautions: Fall Precaution Comments: reports falling at home 3-4 times in 6 months. Often falls backwards when using rollator walker particularly on ramp Restrictions Weight Bearing Restrictions: No Other Position/Activity Restrictions: No weight bearing orders, anticipate pt non-weight bearing through amputated extremity will receive clarification. Pain: Pain Assessment Pain Assessment: 0-10 Pain Score: 8  Pain Type: Acute pain Pain Location: Leg Pain Orientation: Right Pain Intervention(s): RN made aware, brought medication  See FIM for current functional status  Therapy/Group: Individual Therapy  Wilhemina Bonito 04/26/2013, 3:26 PM

## 2013-04-27 ENCOUNTER — Inpatient Hospital Stay (HOSPITAL_COMMUNITY): Payer: Medicare HMO | Admitting: Physical Therapy

## 2013-04-27 ENCOUNTER — Inpatient Hospital Stay (HOSPITAL_COMMUNITY): Payer: Medicare HMO

## 2013-04-27 ENCOUNTER — Encounter: Payer: Self-pay | Admitting: Physician Assistant

## 2013-04-27 DIAGNOSIS — IMO0001 Reserved for inherently not codable concepts without codable children: Secondary | ICD-10-CM

## 2013-04-27 DIAGNOSIS — I739 Peripheral vascular disease, unspecified: Secondary | ICD-10-CM

## 2013-04-27 DIAGNOSIS — I4891 Unspecified atrial fibrillation: Secondary | ICD-10-CM

## 2013-04-27 DIAGNOSIS — E1165 Type 2 diabetes mellitus with hyperglycemia: Secondary | ICD-10-CM

## 2013-04-27 DIAGNOSIS — L98499 Non-pressure chronic ulcer of skin of other sites with unspecified severity: Secondary | ICD-10-CM

## 2013-04-27 DIAGNOSIS — S88119A Complete traumatic amputation at level between knee and ankle, unspecified lower leg, initial encounter: Secondary | ICD-10-CM

## 2013-04-27 DIAGNOSIS — I10 Essential (primary) hypertension: Secondary | ICD-10-CM

## 2013-04-27 LAB — GLUCOSE, CAPILLARY
GLUCOSE-CAPILLARY: 140 mg/dL — AB (ref 70–99)
GLUCOSE-CAPILLARY: 167 mg/dL — AB (ref 70–99)
Glucose-Capillary: 218 mg/dL — ABNORMAL HIGH (ref 70–99)
Glucose-Capillary: 224 mg/dL — ABNORMAL HIGH (ref 70–99)

## 2013-04-27 MED ORDER — GLIMEPIRIDE 2 MG PO TABS
2.0000 mg | ORAL_TABLET | Freq: Every day | ORAL | Status: DC
  Administered 2013-04-27 – 2013-04-28 (×2): 2 mg via ORAL
  Filled 2013-04-27 (×5): qty 1

## 2013-04-27 NOTE — Progress Notes (Signed)
Occupational Therapy Session Note  Patient Details  Name: Leroy [name] MRN: [id number] Date of Birth: [birth year]  Today's Date: 04/27/2013 Time: 0729-0832 Time Calculation (min): 63 min  Short Term Goals: Week 1:  OT Short Term Goal 1 (Week 1): Patient will complete bathing and dressing upper body seated with supervision and setup. OT Short Term Goal 2 (Week 1): Patient will complete lower body bathing using AE, prn, with min assist OT Short Term Goal 3 (Week 1): Patient will complete functional transfer, bed <> w/c, with mod assist OT Short Term Goal 4 (Week 1): Patient will complete toileting using LRAD with min assist for thoroughness  Skilled Therapeutic Interventions: ADL-retraining with focus on sit <> stand, squat pivot transfers (bed > w/c, w/c > toilet), toilet hygiene, and problem-solving.   Patient attempted stand-pivot transfer from bed to w/c using RW but failed due to UE weakness and reverted to squat-pivot technique with standby assist to secure equipment (brace w/c and manage wound vac).   Patient then bathed at sink seated in w/c, with setup assist, electing to complete shaving again this session.   After 55 min of treatment, prior to bathing LB, patient reported need for BM and requested attempt at using toilet.   Patient voided successfully but was unable to reach his buttocks for thorough hygiene, using lateral leans.   Patient required max assist for toileting this session but should progress to mod I with use of toilet aid.     Therapy Documentation Precautions:  Precautions Precautions: Fall Precaution Comments: reports falling at home 3-4 times in 6 months. Often falls backwards when using rollator walker particularly on ramp Restrictions Weight Bearing Restrictions: No Other Position/Activity Restrictions: No weight bearing orders, anticipate pt non-weight bearing through amputated extremity will receive clarification.  Vital Signs: Therapy Vitals Pulse Rate:  81 BP: 124/76 mmHg  Pain: Pain Assessment Pain Assessment: 0-10 Pain Score: 8  Pain Type: Surgical pain Pain Location: Leg Pain Frequency: Constant Pain Intervention(s): Medication (See eMAR)  ADL: ADL ADL Comments:  (see FIM)  See FIM for current functional status  Therapy/Group: Individual Therapy  Second session: Time: 1445-1530 Time Calculation (min):  45 min  Pain Assessment: No pain  Skilled Therapeutic Interventions: ADL-retraining with focus on tub transfer, home safety, and use of AE/DME.   Patient received in his room, family (ex-wife) present with 2 friends (former co-workers).   Patient was educated on use of toilet aids but rejected use of reacher extenders, asserting that he would learn to wipe himself using lateral leans or fabricate his own tool to insure thoroughness with toilet hygiene.   Patient was escorted to ADL bathroom to practice tub transfer from door to tub bench but he was unable to take steps (hops) from doorway due to UE weakness.   Patient performed successful lateral transfer from w/c with understanding that he cannot bring his w/c into his own bathroom.    Following tub transfer, OT educated patient and his ex-wife on use of bidet for toilet hygiene (literature provided).   See FIM for current functional status  Therapy/Group: Individual Therapy  Kaija Kovacevic 04/27/2013, 12:35 PM

## 2013-04-27 NOTE — Progress Notes (Signed)
Subjective/Complaints: No new issues. Slept well. Therapy productive yesterday. Sugars up.  A 12 point review of systems has been performed and if not noted above is otherwise negative.   Objective: Vital Signs: Blood pressure 145/74, pulse 78, temperature 98.4 F (36.9 C), temperature source Oral, resp. rate 18, SpO2 96.00%. No results found. No results found for this basename: WBC, HGB, HCT, PLT,  in the last 72 hours No results found for this basename: NA, K, CL, CO, GLUCOSE, BUN, CREATININE, CALCIUM,  in the last 72 hours CBG (last 3)   Recent Labs  04/26/13 1714 04/26/13 2058 04/27/13 0702  GLUCAP 157* 240* 218*    Wt Readings from Last 3 Encounters:  04/12/13 129.4 kg (285 lb 4.4 oz)  04/12/13 129.4 kg (285 lb 4.4 oz)  04/12/13 129.4 kg (285 lb 4.4 oz)    Physical Exam:  Constitutional: He is oriented to person, place, and time. Obese african [place] male in no distress  HENT: dentition poor  Head: Normocephalic.  Eyes: EOM are normal.  Neck: Normal range of motion. Neck supple. No thyromegaly present.  Cardiovascular: Normal rate and regular rhythm. No murmurs or rubs  Respiratory: Effort normal and breath sounds normal except for a few expiratory wheezes. No respiratory distress.  GI: Soft. Bowel sounds are normal. He exhibits no distension.  Neurological: He is alert and oriented to person, place, and time. CN exam normal. Good insight and awareness. UES 5/5 proximal to distal. LLE -HF and KE 4/5. RLE HF and KE 2+ to 3-. No gross sensory deficits.  Skin:  Right BKA site staples intact with minimal  Drainage, swelling decreased . Left BKA site well healed/ pt wearing prosthesis. Vac in place right groin   Assessment/Plan: 1. Functional deficits secondary to new right BKA, recent left BKA which require 3+ hours per day of interdisciplinary therapy in a comprehensive inpatient rehab setting. Physiatrist is providing close team supervision and 24 hour management  of active medical problems listed below. Physiatrist and rehab team continue to assess barriers to discharge/monitor patient progress toward functional and medical goals. FIM: FIM - Bathing Bathing Steps Patient Completed: Chest;Right Arm;Left Arm;Abdomen;Front perineal area;Right upper leg;Left upper leg Bathing: 4: Min-Patient completes 8-9 36f 10 parts or 75+ percent  FIM - Upper Body Dressing/Undressing Upper body dressing/undressing steps patient completed: Thread/unthread right sleeve of pullover shirt/dresss;Thread/unthread left sleeve of pullover shirt/dress;Put head through opening of pull over shirt/dress;Pull shirt over trunk Upper body dressing/undressing: 7: Complete Independence: No helper FIM - Lower Body Dressing/Undressing Lower body dressing/undressing steps patient completed: Thread/unthread left underwear leg;Pull underwear up/down Lower body dressing/undressing: 4: Min-Patient completed 75 plus % of tasks  FIM - Toileting Toileting: 0: Activity did not occur  FIM - Diplomatic Services operational officer Devices: Bedside commode;Grab bars Toilet Transfers: 4-To toilet/BSC: Min A (steadying Pt. > 75%);4-From toilet/BSC: Min A (steadying Pt. > 75%)  FIM - Bed/Chair Transfer Bed/Chair Transfer Assistive Devices: HOB elevated;Bed rails;Walker Bed/Chair Transfer: 5: Supine > Sit: Supervision (verbal cues/safety issues);4: Bed > Chair or W/C: Min A (steadying Pt. > 75%)  FIM - Locomotion: Wheelchair Distance: 115 Locomotion: Wheelchair: 2: Travels 50 - 149 ft with supervision, cueing or coaxing FIM - Locomotion: Ambulation Locomotion: Ambulation Assistive Devices: Walker - Rolling;Prosthesis Locomotion: Ambulation: 1: Two helpers  Comprehension Comprehension Mode: Auditory Comprehension: 5-Understands complex 90% of the time/Cues < 10% of the time  Expression Expression Mode: Verbal Expression: 5-Expresses basic 90% of the time/requires cueing < 10% of the  time.  Social Interaction Social Interaction: 5-Interacts appropriately 90% of the time - Needs monitoring or encouragement for participation or interaction.  Problem Solving Problem Solving: 4-Solves basic 75 - 89% of the time/requires cueing 10 - 24% of the time  Memory Memory: 4-Recognizes or recalls 75 - 89% of the time/requires cueing 10 - 24% of the time  Medical Problem List and Plan:  1. 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 DVT Prophylaxis/Anticoagulation: Xarelto as directed  3. Pain Management: Oxycodone as needed. Monitor with increased mobility   - gabapentin for phantom limb pain   4. Neuropsych: This patient is capable of making decisions on his own behalf.  5. 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 [facility] to follow up for fit/ed 6. Hypertension. Lisinopril 5 mg daily, Toprol 50 mg twice a day. Monitor with increased mobility  7. Diabetes mellitus with peripheral neuropathy. Hemoglobin A1c 12.4. SSI.   - Check CBGs a.c. and at bedtime.    -added low dose amaryl 1mg   -may resume insulin as well as numbers appear to be climbing. Reviewed diet choices with patient  8. PAF.Xarelto. Cardiac rate controlled. Continue Lopressor  9. Hyperlipidemia. Zetia  10. Poor medical compliance. Provide counseling   LOS (Days) 6 A FACE TO FACE EVALUATION WAS PERFORMED  SWARTZ,ZACHARY T 04/27/2013 8:04 AM

## 2013-04-27 NOTE — Progress Notes (Signed)
Physical Therapy Session Note  Patient Details  Name: Leroy [name] MRN: [id number] Date of Birth: December 20, 1945  Today's Date: 04/27/2013 Time: 5183-3582 Time Calculation (min): 65 min  Short Term Goals: Week 1:  PT Short Term Goal 1 (Week 1): Pt will transfer bed<> chair with moderate assistance.  PT Short Term Goal 2 (Week 1): Pt will perform 1 min continuous standing with +2 assistance or mechanical lift PT Short Term Goal 3 (Week 1): Pt will perform supine>sit with min assist.  Skilled Therapeutic Interventions/Progress Updates:    Scoot transfers x 4 reps during session all with min assist. Practiced management of leg rests and positioning chair, min assist and min/mod verbal cues. Ambulation (hop gait) x 6', 3' with bariatric RW and +2 for safety, mod assist overall w/c to follow. Core strengthening with modified crunches + weighted ball toss (residual limb protected by therapist). Wheelchair propulsion for endurance x 100' with supervision. Discussed level of assist available at home.   Second Session Time:  1300-1350 Time Calculation (min): 50 min Skilled Therapeutic Interventions/Progress Updates:  Attempted scoot transfer recliner>wheelchair to Rt, transfer became unsafe d/t decreased ability to lift buttocks enough when going to Rt. PT stopped transfer and set-up for transfer to Lt, pt able to perform at min assist level. Wheelchair propulsion x 100' with supervision, wheelchair set-up for transfer. Practiced scoot transfer to high mat in prep for high truck transfer x 2 reps, min assist and wheelchair stabilized to prevent sliding. Lt. Prosthesis on entire session.   Pt has significant difficulty transfer to elevated surface on Rt.   Therapy Documentation Precautions:  Precautions Precautions: Fall Precaution Comments: reports falling at home 3-4 times in 6 months. Often falls backwards when using rollator walker particularly on ramp Restrictions Weight Bearing Restrictions:  No Other Position/Activity Restrictions: No weight bearing orders, anticipate pt non-weight bearing through amputated extremity will receive clarification. Pain: Pain Assessment Pain Assessment: 0-10 Pain Score: 5  Both sessions Pain Type: Surgical pain Pain Location: Leg Pain Frequency: Constant Pain Intervention(s): premedicated first session, medicated during second session.   See FIM for current functional status  Therapy/Group: Individual Therapy both sessions  Wilhemina Bonito 04/27/2013, 12:35 PM

## 2013-04-27 NOTE — Progress Notes (Addendum)
Pt took prune juice to stimulate  bowel movement

## 2013-04-27 NOTE — Progress Notes (Signed)
Inpatient Diabetes Program Recommendations  AACE/ADA: New Consensus Statement on Inpatient Glycemic Control (2013)  Target Ranges:  Prepandial:   less than 140 mg/dL      Peak postprandial:   less than 180 mg/dL (1-2 hours)      Critically ill patients:  140 - 180 mg/dL   Fasting hyperglycemia :  Inpatient Diabetes Program Recommendations Insulin - Basal: Pt had been on high doses of levemir (45 units bid).  Now pt is on NO basal insulin.  Please add back at least half of previous regimen, i.e. 30-40 units qd or HS Correction (SSI): xxxx Insulin - Meal Coverage: xxx  Thank you, Lenor Coffin, RN, CNS, Diabetes Coordinator [phone])

## 2013-04-28 ENCOUNTER — Inpatient Hospital Stay (HOSPITAL_COMMUNITY): Payer: Medicare HMO

## 2013-04-28 ENCOUNTER — Inpatient Hospital Stay (HOSPITAL_COMMUNITY): Payer: Medicare HMO | Admitting: Physical Therapy

## 2013-04-28 LAB — GLUCOSE, CAPILLARY
Glucose-Capillary: 219 mg/dL — ABNORMAL HIGH (ref 70–99)
Glucose-Capillary: 146 mg/dL — ABNORMAL HIGH (ref 70–99)
Glucose-Capillary: 147 mg/dL — ABNORMAL HIGH (ref 70–99)

## 2013-04-28 NOTE — Discharge Instructions (Addendum)
Information on my medicine - XARELTO (Rivaroxaban)  This medication education was reviewed with me or my healthcare representative as part of my discharge preparation.  The pharmacist that spoke with me during my hospital stay was:  [name] So, Pharm.D.  Why was Xarelto prescribed for you? Xarelto was prescribed for you to reduce the risk of a blood clots forming after orthopedic surgery OR to reduce the risk of forming blood clots that cause a stroke if you have a medical condition called atrial fibrillation (a type of irregular heartbeat).  What do you need to know about xarelto ? Take your Xarelto 20 mg ONCE DAILY at the same time every day with your evening meal. If you have difficulty swallowing the tablet whole, you may crush it and mix in applesauce just prior to taking your dose.  Take Xarelto exactly as prescribed by your doctor and DO NOT stop taking Xarelto without talking to the doctor who prescribed the medication.  Stopping without other stroke or VTE prevention medication to take the place of Xarelto may increase your risk of developing a new clot or stroke.  Refill your prescription before you run out.  After discharge, you should have regular check-up appointments with your healthcare provider that is prescribing your Xarelto.  In the future your dose may need to be changed if your kidney function or weight changes by a significant amount.  What do you do if you miss a dose? If you are taking Xarelto ONCE DAILY and you miss a dose, take it as soon as you remember on the same day then continue your regularly scheduled once daily regimen the next day. Do not take two doses of Xarelto at the same time.   Important Safety Information A possible side effect of Xarelto is bleeding. You should call your healthcare provider right away if you experience any of the following:   Bleeding from an injury or your nose that does not stop.   Unusual colored urine (red or dark brown)  or unusual colored stools (red or black).   Unusual bruising for unknown reasons.   A serious fall or if you hit your head (even if there is no bleeding).  Some medicines may interact with Xarelto and might increase your risk of bleeding while on Xarelto. To help avoid this, consult your healthcare provider or pharmacist prior to using any new prescription or non-prescription medications, including herbals, vitamins, non-steroidal anti-inflammatory drugs (NSAIDs) and supplements.  This website has more information on Xarelto: [email or web address].  Inpatient Rehab Discharge Instructions  Leroy [name] Discharge date and time: No discharge date for patient encounter.   Activities/Precautions/ Functional Status: Activity: activity as tolerated Diet: diabetic diet Wound Care: keep wound clean and dry Functional status:  ___ No restrictions     ___ Walk up steps independently _x__ 24/7 supervision/assistance   ___ Walk up steps with assistance ___ Intermittent supervision/assistance  ___ Bathe/dress independently ___ Walk with walker     ___ Bathe/dress with assistance ___ Walk Independently    ___ Shower independently ___ Walk with assistance    ___ Shower with assistance ___ No alcohol     ___ Return to work/school ________   COMMUNITY REFERRALS UPON DISCHARGE:    Home Health:   PT     OT      RN                     Agency: [facility] Phone: [phone]   Medical Equipment/Items  Ordered: Have sent order for NEW w/c and cushion to be delivered to your home next week                                                     Agency/Supplier: [facility] @ [phone]  OUR REHAB UNIT IS LOANING A WHEELCHAIR AND CUSHION FOR YOUR USE UNTIL YOUR EQUIPMENT IS RECEIVED.  SOCIAL WORK WILL FOLLOW UP WITH YOU NEXT WEEK TO CHECK STATUS AND PICK UP WHEN READY.   GENERAL COMMUNITY RESOURCES FOR PATIENT/FAMILY:  Support Groups: Amputee Support Group       Special Instructions: 4 x 4  Kerlix and Ace wrap to BKA site  Santyl wet to dry right groin twice a day   My questions have been answered and I understand these instructions. I will adhere to these goals and the provided educational materials after my discharge from the hospital.  Patient/Caregiver Signature _______________________________ Date __________  Clinician Signature _______________________________________ Date __________  Please bring this form and your medication list with you to all your follow-up doctor's appointments.

## 2013-04-28 NOTE — Progress Notes (Signed)
   Will be by tomorrow to re-evaluate the right groin.  Continue VAC for now.  In this obese, uncontrolled diabetic, he is at high risk for groin complications.  In my experience, the [facility] is able to speed up recovery as long as no deep space infection develops.    [name], MD [facility] Office: [phone] Pager: [phone]  04/28/2013, 4:41 PM

## 2013-04-28 NOTE — Progress Notes (Signed)
Physical Therapy Note  Patient Details  Name: BRAXTIN FILIPOWICZ MRN: [id number] Date of Birth: [birth year] Today's Date: 04/28/2013  0855 -0950 (55 minutes) individual Pain: 7/10 Rt BKA/ premedicated Focus of treatment: transfer training, gait training, wc mobility training Treatment: Pt up in wc upon arrival; wc mobility - 120- feet, 100 feet  SBA using bilateral UEs; transfer wc > mat scoot (level) SBA ; transfer mat > wc with RW min assist with vcs for hand placement; gait sideways along mat x 2 RW min assist; wc pushups X 10 ; gait 12 feet , 10 feet RW min assist followed closely by wc for safety; transfer wc to recliner with RW min assist; all needs within reach.   1600 -1625 (25 minutes) individual Pain: 6/10 Rt BKA/ premedicated Focus of treatment: therapeutic exercise focused on bilateral UE strengthening/ activity tolerance Treatment: sit to stand to RW from recliner mod assist; transfer with RW + RT prosthesis min assist; UE ergonometer Level 2 , random program X 10 minutes before c/o fatigue; returned to room with all needs within reach.    [place],JIM 04/28/2013, 10:01 AM

## 2013-04-28 NOTE — Progress Notes (Signed)
Social Work Patient ID: Leroy [name], male   DOB: Sep 23, 1945, 68 y.o.   MRN: [id number]  Met with pt yesterday afternoon to review team conference.  Pt aware and agreeable with targeted d/c date of 1/28 at mod i w/c goals overall.  Expresses a little apprehension about whether he will meet these goals within a week.  He reports that his ex-girlfriend still plans to assist prn after d/c.  Will continue to follow for support and d/c planning.  Atoya Andrew, LCSW

## 2013-04-28 NOTE — Progress Notes (Signed)
Occupational Therapy Session Note  Patient Details  Name: Leroy [name] MRN: [id number] Date of Birth: 1945-07-26  Today's Date: 04/28/2013 Time: 0730-0830 Time Calculation (min): 60 min  Short Term Goals: Week 1:  OT Short Term Goal 1 (Week 1): Patient will complete bathing and dressing upper body seated with supervision and setup. OT Short Term Goal 2 (Week 1): Patient will complete lower body bathing using AE, prn, with min assist OT Short Term Goal 3 (Week 1): Patient will complete functional transfer, bed <> w/c, with mod assist OT Short Term Goal 4 (Week 1): Patient will complete toileting using LRAD with min assist for thoroughness  Skilled Therapeutic Interventions: ADL-retraining with focus on adapted lower body bathing and dressing and functional transfers.   With setup assist patient completed pan bath to bathe entire lower body at edge of bed, performing lateral leans to wash buttocks with good thoroughness.   From edge of bed, patient donned prosthesis to perform squat pivot transfer from bed to w/c with only min assist to steady w/c.   From w/c, patient completed upper body bathing and dressing at sink, unassisted.   Patient reports confidence in his ability to perform toilet hygiene unassisted s/p discharge.      Therapy Documentation Precautions:  Precautions Precautions: Fall Precaution Comments: reports falling at home 3-4 times in 6 months. Often falls backwards when using rollator walker particularly on ramp Restrictions Weight Bearing Restrictions: No Other Position/Activity Restrictions: No weight bearing orders, anticipate pt non-weight bearing through amputated extremity will receive clarification.  Pain: 3/10 while supine in bed, 12/10 with activity; RN made aware    ADL: ADL ADL Comments:  (see FIM)  See FIM for current functional status  Therapy/Group: Individual Therapy  Second session: Time: 1345-1430 Time Calculation (min): 45 min  Pain  Assessment: 3/10, R-LE  Skilled Therapeutic Interventions: 30 min of ADL-retraining with focus on bed transfers to standard bed, functional mobility using RW in prep for bathroom transfers; 15 min of therex to improve shoulder girdle stability and general endurance using SciFit, level 1.5.   Patient able to complete sit <> stand 3 times with standby assist to transfer from recliner to w/c using RW, and on/off standard bed without rails in ADL apartment.   Patient completed a total of 10 forward hops and 5 backward hops during functional mobility and transfers.    Using Sci-fit, patient completed total of 12 min of therex travelling equivalent of 1.2 miles, exertion 12 per BORG scale.  See FIM for current functional status  Therapy/Group: Individual Therapy  Ethelbert Thain 04/28/2013, 3:18 PM

## 2013-04-28 NOTE — Progress Notes (Signed)
Subjective/Complaints: Had a good night. Pain controlled. Making progress with therapies  A 12 point review of systems has been performed and if not noted above is otherwise negative.   Objective: Vital Signs: Blood pressure 147/78, pulse 80, temperature 98.4 F (36.9 C), temperature source Oral, resp. rate 18, SpO2 95.00%. No results found. No results found for this basename: WBC, HGB, HCT, PLT,  in the last 72 hours No results found for this basename: NA, K, CL, CO, GLUCOSE, BUN, CREATININE, CALCIUM,  in the last 72 hours CBG (last 3)   Recent Labs  04/27/13 1657 04/27/13 2045 04/28/13 0713  GLUCAP 167* 224* 219*    Wt Readings from Last 3 Encounters:  04/12/13 129.4 kg (285 lb 4.4 oz)  04/12/13 129.4 kg (285 lb 4.4 oz)  04/12/13 129.4 kg (285 lb 4.4 oz)    Physical Exam:  Constitutional: He is oriented to person, place, and time. Obese african [place] male in no distress  HENT: dentition poor  Head: Normocephalic.  Eyes: EOM are normal.  Neck: Normal range of motion. Neck supple. No thyromegaly present.  Cardiovascular: Normal rate and regular rhythm. No murmurs or rubs  Respiratory: Effort normal and breath sounds normal except for a few expiratory wheezes. No respiratory distress.  GI: Soft. Bowel sounds are normal. He exhibits no distension.  Neurological: He is alert and oriented to person, place, and time. CN exam normal. Good insight and awareness. UES 5/5 proximal to distal. LLE -HF and KE 4/5. RLE HF and KE 2+ to 3-. No gross sensory deficits.  Skin:  Right BKA site staples intact with minimal  Drainage, swelling decreased . Left BKA site well healed/ pt wearing prosthesis. Vac in place right groin   Assessment/Plan: 1. Functional deficits secondary to new right BKA, recent left BKA which require 3+ hours per day of interdisciplinary therapy in a comprehensive inpatient rehab setting. Physiatrist is providing close team supervision and 24 hour management of  active medical problems listed below. Physiatrist and rehab team continue to assess barriers to discharge/monitor patient progress toward functional and medical goals. FIM: FIM - Bathing Bathing Steps Patient Completed: Chest;Right Arm;Left Arm;Abdomen;Front perineal area;Right upper leg;Left upper leg;Right lower leg (including foot);Left lower leg (including foot) Bathing: 4: Min-Patient completes 8-9 62f 10 parts or 75+ percent  FIM - Upper Body Dressing/Undressing Upper body dressing/undressing steps patient completed: Thread/unthread right sleeve of pullover shirt/dresss;Thread/unthread left sleeve of pullover shirt/dress;Put head through opening of pull over shirt/dress;Pull shirt over trunk Upper body dressing/undressing: 5: Set-up assist to: Obtain clothing/put away FIM - Lower Body Dressing/Undressing Lower body dressing/undressing steps patient completed: Thread/unthread left underwear leg;Pull underwear up/down Lower body dressing/undressing: 1: Total-Patient completed less than 25% of tasks  FIM - Toileting Toileting steps completed by patient: Adjust clothing prior to toileting Toileting: 2: Max-Patient completed 1 of 3 steps  FIM - Diplomatic Services operational officer Devices: Grab bars;Prosthesis Toilet Transfers: 4-To toilet/BSC: Min A (steadying Pt. > 75%)  FIM - Bed/Chair Transfer Bed/Chair Transfer Assistive Devices: Arm rests;Prosthesis Bed/Chair Transfer: 5: Supine > Sit: Supervision (verbal cues/safety issues);5: Sit > Supine: Supervision (verbal cues/safety issues)  FIM - Locomotion: Wheelchair Distance: 115 Locomotion: Wheelchair: 2: Travels 50 - 149 ft with supervision, cueing or coaxing FIM - Locomotion: Ambulation Locomotion: Ambulation Assistive Devices: Walker - Rolling;Prosthesis Locomotion: Ambulation: 1: Two helpers  Comprehension Comprehension Mode: Auditory Comprehension: 5-Understands complex 90% of the time/Cues < 10% of the  time  Expression Expression Mode: Verbal Expression: 5-Expresses basic 90% of  the time/requires cueing < 10% of the time.  Social Interaction Social Interaction: 5-Interacts appropriately 90% of the time - Needs monitoring or encouragement for participation or interaction.  Problem Solving Problem Solving: 4-Solves basic 75 - 89% of the time/requires cueing 10 - 24% of the time  Memory Memory: 4-Recognizes or recalls 75 - 89% of the time/requires cueing 10 - 24% of the time  Medical Problem List and Plan:  1. 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 DVT Prophylaxis/Anticoagulation: Xarelto as directed  3. Pain Management: Oxycodone as needed. Monitor with increased mobility   - gabapentin for phantom limb pain  effective 4. Neuropsych: This patient is capable of making decisions on his own behalf.  5. 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 Advanced to follow up for fit/ed 6. Hypertension. Lisinopril 5 mg daily, Toprol 50 mg twice a day. Monitor with increased mobility  7. Diabetes mellitus with peripheral neuropathy. Hemoglobin A1c 12.4. SSI.   - Check CBGs a.c. and at bedtime.    -continue to titrate amaryl as required  -will try to manage without insulin if possible  8. PAF.Xarelto. Cardiac rate controlled. Continue Lopressor  9. Hyperlipidemia. Zetia  10. Poor medical compliance. Provide counseling   LOS (Days) 7 A FACE TO FACE EVALUATION WAS PERFORMED  Kypton Eltringham T 04/28/2013 8:03 AM

## 2013-04-28 NOTE — Patient Care Conference (Signed)
Inpatient RehabilitationTeam Conference and Plan of Care Update Date: 04/27/2013   Time: 12:41 PM    Patient Name: Leroy GriffithsWilliam J Rhee      Medical Record Number: [id number]  Date of Birth: 1945/12/22 Sex: Male         Room/Bed: 4W01C/4W01C-01 Payor Info: Payor: [employer] MEDICARE / Plan: [employer] MEDICARE HMO / Product Type: *No Product type* /    Admitting Diagnosis: RT BKA  Admit Date/Time:  04/21/2013  6:41 PM Admission Comments: No comment available   Primary Diagnosis:  <principal problem not specified> Principal Problem: <principal problem not specified>  Patient Active Problem List   Diagnosis Date Noted  . S/P bilateral BKA (below knee amputation) 04/21/2013  . Chronic systolic heart failure 04/15/2013  . Discoloration of skin of foot-Right  04/12/2013  . Atherosclerosis of native arteries of the extremities with gangrene 04/12/2013  . Gangrene of foot 04/12/2013  . 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 Critical lower limb ischemia 03/09/2013  . Foot pain 03/09/2013  . Peripheral vascular disease, unspecified 11/06/2012  . Unilateral complete BKA 09/10/2012  . SVT (supraventricular tachycardia) 09/02/2012  . Onychomycosis of toenail 01/02/2012  . Hyperlipidemia, familial, high LDL 01/02/2012  . Type II or unspecified type diabetes mellitus with neurological manifestations, uncontrolled(250.62) 05/03/2010  . CAD 04/13/2010  . CARDIOMYOPATHY 04/13/2010  . ATRIAL FIBRILLATION, PAROXYSMAL 04/13/2010    Expected Discharge Date: Expected Discharge Date: 05/05/13  Team Members Present: Physician leading conference: Dr. Faith RogueZachary Swartz Social Worker Present: Amada JupiterLucy Isobella Ascher, LCSW Nurse Present: Other (comment) Phylliss Bob(Kom Avegno, RN) PT Present: Sherrine MaplesHannah Gillispie, Grayland OrmondPT;Alison Gray, PT OT Present: Donzetta KohutFrank Barthold, Loistine ChanceT;Karen Pulaski, OT;Patricia Lennoxlay, OT PPS Coordinator present : Tora DuckMarie Noel, RN, CRRN     Current Status/Progress Goal Weekly Team Focus  Medical   right bka. wound care, vac. pain  better. diabetes up and down  pain mgt, med mgt for function  maximize function for therapy activities   Bowel/Bladder   Continent of bowel and bladder  min A  remain continent of bowel and bladder   Swallow/Nutrition/ Hydration             ADL's   Min - Mod A transfers, Mod A LB dressing, Min A toileting  Mod I ADL and transfers  Dynamic sitting/standing balance, functional transfers, adapted bathing and dressing skills, improved endurance   Mobility   Min assist scoot transfers, +2 very short distance gait  supervision transfers, modified independent wheelchair propulsion  Safety with transfers standing balance, sit <> stands from wheelchair, endurance   Communication             Safety/Cognition/ Behavioral Observations            Pain   5-10mg . Oxy IR q4hr PRN  <3 on a 0-10 scale  Assess pain q2hr and medicate appropriately and reassess after medication intervention   Skin   Staples to Rt. BKA, Wound Vac to Rt. groin  No new skin breakdown or infection while on rehab  Assess skin q shift    Rehab Goals Patient on target to meet rehab goals: Yes *See Care Plan and progress notes for long and short-term goals.  Barriers to Discharge: fatigue, cognitive delays    Possible Resolutions to Barriers:  repetition, safety techniques, pros adjustment    Discharge Planning/Teaching Needs:  home with ex-girlfriend to assist       Team Discussion:  Prosthesis to be re-evaled;  Poor endurance overall but hope to reach mod i w/c level goals.  MD to  get clarification on plans for VAC.  Hoping to maintain on oral meds for DM.    Revisions to Treatment Plan:  None   Continued Need for Acute Rehabilitation Level of Care: The patient requires daily medical management by a physician with specialized training in physical medicine and rehabilitation for the following conditions: Daily direction of a multidisciplinary physical rehabilitation program to ensure safe treatment while eliciting  the highest outcome that is of practical value to the patient.: Yes Daily medical management of patient stability for increased activity during participation in an intensive rehabilitation regime.: Yes Daily analysis of laboratory values and/or radiology reports with any subsequent need for medication adjustment of medical intervention for : Neurological problems;Post surgical problems  Kailan Laws 04/28/2013, 12:44 PM

## 2013-04-29 ENCOUNTER — Inpatient Hospital Stay (HOSPITAL_COMMUNITY): Payer: Medicare Other | Admitting: Physical Therapy

## 2013-04-29 ENCOUNTER — Inpatient Hospital Stay (HOSPITAL_COMMUNITY): Payer: Medicare HMO

## 2013-04-29 LAB — GLUCOSE, CAPILLARY
GLUCOSE-CAPILLARY: 173 mg/dL — AB (ref 70–99)
Glucose-Capillary: 220 mg/dL — ABNORMAL HIGH (ref 70–99)
Glucose-Capillary: 277 mg/dL — ABNORMAL HIGH (ref 70–99)
Glucose-Capillary: 187 mg/dL — ABNORMAL HIGH (ref 70–99)
Glucose-Capillary: 209 mg/dL — ABNORMAL HIGH (ref 70–99)

## 2013-04-29 MED ORDER — GLIMEPIRIDE 4 MG PO TABS
4.0000 mg | ORAL_TABLET | Freq: Every day | ORAL | Status: DC
  Administered 2013-04-30 – 2013-05-03 (×4): 4 mg via ORAL
  Filled 2013-04-29 (×6): qty 1

## 2013-04-29 MED ORDER — COLLAGENASE 250 UNIT/GM EX OINT
TOPICAL_OINTMENT | Freq: Two times a day (BID) | CUTANEOUS | Status: DC
  Administered 2013-04-29 – 2013-05-04 (×9): via TOPICAL
  Administered 2013-05-05: 1 via TOPICAL
  Administered 2013-05-05 – 2013-05-06 (×3): via TOPICAL
  Administered 2013-05-07: 1 via TOPICAL
  Filled 2013-04-29: qty 30

## 2013-04-29 NOTE — Progress Notes (Addendum)
[facility] Progress Note  04/29/2013 12:56 PM   Subjective:  No complaints   Filed Vitals:   04/29/13 0950  BP:   Pulse: 90  Temp:   Resp:     Physical Exam: Incisions:  Right groin granulating in nicely.  Wound is significantly smaller than late last week. Extremities:  Right stump with bandage dressing in place.  CBC    Component Value Date/Time   WBC 11.4* 04/22/2013 0504   RBC 3.86* 04/22/2013 0504   HGB 11.6* 04/22/2013 0504   HCT 35.1* 04/22/2013 0504   PLT 387 04/22/2013 0504   MCV 90.9 04/22/2013 0504   MCH 30.1 04/22/2013 0504   MCHC 33.0 04/22/2013 0504   RDW 13.1 04/22/2013 0504   LYMPHSABS 2.0 04/22/2013 0504   MONOABS 1.0 04/22/2013 0504   EOSABS 0.2 04/22/2013 0504   BASOSABS 0.0 04/22/2013 0504    BMET    Component Value Date/Time   NA 138 04/22/2013 0504   K 4.3 04/22/2013 0504   CL 100 04/22/2013 0504   CO2 23 04/22/2013 0504   GLUCOSE 122* 04/22/2013 0504   BUN 12 04/22/2013 0504   CREATININE 0.95 04/22/2013 0504   CALCIUM 8.7 04/22/2013 0504   GFRNONAA 85* 04/22/2013 0504   GFRAA >90 04/22/2013 0504    INR    Component Value Date/Time   INR 1.17 04/12/2013 2155   INR 1.6 04/25/2010 0904     Intake/Output Summary (Last 24 hours) at 04/29/13 1256 Last data filed at 04/29/13 0800  Gross per 24 hour  Intake    720 ml  Output   1575 ml  Net   -855 ml      Assessment:  68 y.o. male is s/p:  Right BKA and right groin wound debridement on 04/14/13    Plan: -wound has significantly improved since late last week -will leave wound vac off now and use Santyl with wet to dry dressing bid. -pt making good progress -staples out one month from surgery   Doreatha MassedSamantha Rhyne, PA-C [facility] [phone] 04/29/2013 12:56 PM  Addendum  I have independently interviewed and examined the patient, and I agree with the physician assistant's findings.  Excellent wound contracture with increased vascularization.  I'm ok with  Santyl ointment to the wound BID with wet-to-dry dressing on top BID.  Pt can follow in clinic two weeks.  Leonides SakeBrian Nayanna Seaborn, MD [facility] [place] Office: [phone] Pager: [phone]  04/30/2013, 7:52 AM

## 2013-04-29 NOTE — Progress Notes (Signed)
Occupational Therapy Session Note  Patient Details  Name: Leroy [name] MRN: [id number] Date of Birth: 23-Jul-1945  Today's Date: 04/29/2013 Time: 0730-0829 Time Calculation (min): 59 min  Short Term Goals: Week 1:  OT Short Term Goal 1 (Week 1): Patient will complete bathing and dressing upper body seated with supervision and setup. OT Short Term Goal 2 (Week 1): Patient will complete lower body bathing using AE, prn, with min assist OT Short Term Goal 3 (Week 1): Patient will complete functional transfer, bed <> w/c, with mod assist OT Short Term Goal 4 (Week 1): Patient will complete toileting using LRAD with min assist for thoroughness  Skilled Therapeutic Interventions/Progress Updates:  ADL-retraining with focus on adapted bathing and dressing, sitting at edge of bed, sit<>stand, and functional transfers.   Patient completed bathing and dressing at edge of bed, using lateral leans to cleanse buttocks and don pants, with only setup assist.   Patient then transferred from edge of bed to w/c with standby assist but required 3 attempts before rising to stand at [facility].   OT re-educated patient on need to maintain consistent method with sit>stand to prevent falls.    Therapy Documentation Precautions:  Precautions Precautions: Fall Precaution Comments: reports falling at home 3-4 times in 6 months. Often falls backwards when using rollator walker particularly on ramp Restrictions Weight Bearing Restrictions: No Other Position/Activity Restrictions: No weight bearing orders, anticipate pt non-weight bearing through amputated extremity will receive clarification. General:   Vital Signs: Therapy Vitals Temp: 97.2 F (36.2 C) Temp src: Oral Pulse Rate: 65 Resp: 16 BP: 136/76 mmHg Patient Position, if appropriate: Lying Oxygen Therapy SpO2: 96 % O2 Device: None (Room air) Pain: Pain Assessment Pain Assessment: 0-10 Pain Score: 3  Pain Type: Surgical pain ADL: ADL ADL Comments:   (see FIM) Exercises:   Other Treatments:    See FIM for current functional status  Therapy/Group: Individual Therapy  Second session: Time:1300-1345 Time Calculation (min): 45 min  Pain Assessment: No report of pain  Skilled Therapeutic Interventions: ADL-retraining with focus on homemaking at w/c level and tub bench transfers.   Re-educated patient on use of countertops and strategies to reduce burn and fall injuries while preparing meals at w/c level.   Per patient, he has retained skills to complete meal prep at wc-level from previous L-BKA and is confident he can return to same level of performance.  From ADL kitchen, patient was escorted to bathroom in w/c where he performed lateral transfer to tub bench, removed and re-applied left LE prosthesis and returned to w/c.  Although his doorway is too narrow for either a w/c or RW, patient claims that he had developed a method of transferring in/out of his bathroom using folding steel chairs after his L-BKA.  Patient was receptive to use of tub bench versus Clinical biochemist and he completed transfer this session with only setup assist and supervision for safety.  See FIM for current functional status  Therapy/Group: Individual Therapy  Myalynn Lingle 04/29/2013, 8:30 AM

## 2013-04-29 NOTE — Progress Notes (Signed)
Physical Therapy Weekly Progress Note  Patient Details  Name: Leroy [name] MRN: [id number] Date of Birth: 1946-03-07  Today's Date: 04/29/2013 Time: 7680-8811 Time Calculation (min): 56 min  Patient has met 3 of 3 short term goals.  Pt making excellent progress! Pt is currently able to perform level scoot transfers at min assist level with prosthesis donned. Pt has increased difficulty transferring to the Rt. Pt is able to ambulate very short distances ~10' with bariatric RW and min assist/+2 for safety.   Patient continues to demonstrate the following deficits: decreased endurance, impaired standing balance, increased need for assistance as compared to baseline, decreased safety with transfers and therefore will continue to benefit from skilled PT intervention to enhance overall performance with activity tolerance, balance, ability to compensate for deficits and awareness.  Patient progressing toward long term goals..  Continue plan of care.  PT Short Term Goals Week 1:  PT Short Term Goal 1 (Week 1): Pt will transfer bed<> chair with moderate assistance.  PT Short Term Goal 1 - Progress (Week 1): Met PT Short Term Goal 2 (Week 1): Pt will perform 1 min continuous standing with +2 assistance or mechanical lift PT Short Term Goal 2 - Progress (Week 1): Met PT Short Term Goal 3 (Week 1): Pt will perform supine>sit with min assist. PT Short Term Goal 3 - Progress (Week 1): Met  Skilled Therapeutic Interventions/Progress Updates:   no c/o pain  Wheelchair propulsion x 150' with supervision, intermittent rest breaks due to impaired endurance. Stand pivot with bariatric RW to Rt/Lt, scoot transfers to Rt/Lt performed with min-guard/supervision respectively. Shoulder girdle pushups on blocks 1 x 15 reps with 5 sec holds. Supine Rt. Quad sets with distal extremity on pillow (manual facilitation of quads needed, poor firing of muscle), sidelying hip extension manually resisted 2 x 10 reps each.  Ambulation x 20' hop gait with min assist and bariatric RW, +2 for w/c to follow.   Prosthesis on entire session.   Second Session Time:  0315-9458 Time Calculation (min): 35 min Skilled Therapeutic Interventions/Progress Updates:  Pain: sore shoulders, no intervention needed Session focused on strengthening/conditioning. Wheelchair pushups x 10 reps (sign posted in room for pressure relief). Sit <> stands x 10 reps without rest, practiced efficiency and safety with transition from w/c to RW. Wheelchair propulsion x 110' at fast pace, supervision. Pt performed stand pivot transfer with RW to recliner, min-guard assist. Prosthesis donned entire session.   Therapy Documentation Precautions:  Precautions Precautions: Fall Precaution Comments: reports falling at home 3-4 times in 6 months. Often falls backwards when using rollator walker particularly on ramp Restrictions Weight Bearing Restrictions: No Other Position/Activity Restrictions: No weight bearing orders, anticipate pt non-weight bearing through amputated extremity will receive clarification.  See FIM for current functional status  Therapy/Group: Individual Therapy both sessions  Lahoma Rocker 04/29/2013, 12:29 PM

## 2013-04-29 NOTE — Progress Notes (Addendum)
Subjective/Complaints: Had a good night. Pain controlled. Making progress with therapies  A 12 point review of systems has been performed and if not noted above is otherwise negative.   Objective: Vital Signs: Blood pressure 136/76, pulse 65, temperature 97.2 F (36.2 C), temperature source Oral, resp. rate 16, weight 129.8 kg (286 lb 2.5 oz), SpO2 96.00%. No results found. No results found for this basename: WBC, HGB, HCT, PLT,  in the last 72 hours No results found for this basename: NA, K, CL, CO, GLUCOSE, BUN, CREATININE, CALCIUM,  in the last 72 hours CBG (last 3)   Recent Labs  04/28/13 1638 04/28/13 2037 04/29/13 0728  GLUCAP 147* 173* 220*    Wt Readings from Last 3 Encounters:  04/28/13 129.8 kg (286 lb 2.5 oz)  04/12/13 129.4 kg (285 lb 4.4 oz)  04/12/13 129.4 kg (285 lb 4.4 oz)    Physical Exam:  Constitutional: He is oriented to person, place, and time. Obese african [place] male in no distress  HENT: dentition poor  Head: Normocephalic.  Eyes: EOM are normal.  Neck: Normal range of motion. Neck supple. No thyromegaly present.  Cardiovascular: Normal rate and regular rhythm. No murmurs or rubs  Respiratory: Effort normal and breath sounds normal except for a few expiratory wheezes. No respiratory distress.  GI: Soft. Bowel sounds are normal. He exhibits no distension.  Neurological: He is alert and oriented to person, place, and time. CN exam normal. Good insight and awareness. UES 5/5 proximal to distal. LLE -HF and KE 4/5. RLE HF and KE 2+ to 3-. No gross sensory deficits.  Skin:  Right BKA site staples intact with minimal  Drainage, swelling decreased . Left BKA site well healed/ pt wearing prosthesis. Vac in place right groin   Assessment/Plan: 1. Functional deficits secondary to new right BKA, recent left BKA which require 3+ hours per day of interdisciplinary therapy in a comprehensive inpatient rehab setting. Physiatrist is providing close team  supervision and 24 hour management of active medical problems listed below. Physiatrist and rehab team continue to assess barriers to discharge/monitor patient progress toward functional and medical goals. FIM: FIM - Bathing Bathing Steps Patient Completed: Chest;Right Arm;Left Arm;Abdomen;Front perineal area;Buttocks;Right upper leg;Left upper leg Bathing: 7: Complete Independence: No helper  FIM - Upper Body Dressing/Undressing Upper body dressing/undressing steps patient completed: Thread/unthread right sleeve of pullover shirt/dresss;Thread/unthread left sleeve of pullover shirt/dress;Put head through opening of pull over shirt/dress;Pull shirt over trunk (lower body while sitting at edge of bed, using lateral leans) Upper body dressing/undressing: 7: Complete Independence: No helper FIM - Lower Body Dressing/Undressing Lower body dressing/undressing steps patient completed: Thread/unthread right underwear leg;Thread/unthread left underwear leg;Pull underwear up/down Lower body dressing/undressing: 5: Set-up assist to: Obtain clothing (sitting at edge of bed, using lateral leans)  FIM - Toileting Toileting steps completed by patient: Adjust clothing prior to toileting Toileting: 2: Max-Patient completed 1 of 3 steps  FIM - Diplomatic Services operational officer Devices: Grab bars;Prosthesis Toilet Transfers: 4-To toilet/BSC: Min A (steadying Pt. > 75%)  FIM - Bed/Chair Transfer Bed/Chair Transfer Assistive Devices: Arm rests Bed/Chair Transfer: 6: Supine > Sit: No assist;5: Bed > Chair or W/C: Supervision (verbal cues/safety issues) (squat pivot transfer)  FIM - Locomotion: Wheelchair Distance: 115 Locomotion: Wheelchair: 2: Travels 50 - 149 ft with supervision, cueing or coaxing FIM - Locomotion: Ambulation Locomotion: Ambulation Assistive Devices: Walker - Rolling;Prosthesis Locomotion: Ambulation: 1: Two helpers  Comprehension Comprehension Mode: Auditory Comprehension:  6-Follows complex conversation/direction: With extra time/assistive  device  Expression Expression Mode: Verbal Expression: 6-Expresses complex ideas: With extra time/assistive device  Social Interaction Social Interaction: 6-Interacts appropriately with others with medication or extra time (anti-anxiety, antidepressant).  Problem Solving Problem Solving: 4-Solves basic 75 - 89% of the time/requires cueing 10 - 24% of the time  Memory Memory: 4-Recognizes or recalls 75 - 89% of the time/requires cueing 10 - 24% of the time  Medical Problem List and Plan:  1. 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 Surgeon to reassess today 2. DVT Prophylaxis/Anticoagulation: Xarelto as directed  3. Pain Management: Oxycodone as needed. Monitor with increased mobility   - gabapentin for phantom limb pain  effective 4. Neuropsych: This patient is capable of making decisions on his own behalf.  5. 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 [facility] to follow up for fit/ed 6. Hypertension. Lisinopril 5 mg daily, Toprol 50 mg twice a day. Monitor with increased mobility  7. Diabetes mellitus with peripheral neuropathy. Hemoglobin A1c 12.4. SSI.   - Check CBGs a.c. and at bedtime.    -increase amaryl to 4mg   -will try to manage without insulin if possible given pt's compliance hx  8. PAF.Xarelto. Cardiac rate controlled. Continue Lopressor  9. Hyperlipidemia. Zetia  10. Poor medical compliance. Provide counseling   LOS (Days) 8 A FACE TO FACE EVALUATION WAS PERFORMED  SWARTZ,ZACHARY T 04/29/2013 8:07 AM

## 2013-04-29 NOTE — Progress Notes (Signed)
Orthopedic Tech Progress Note Patient Details:  Leroy [name] 1945-06-18 [id number] Brace completed by [facility] Patient ID: [name], male   DOB: [birth year], 69 y.o.   MRN: [id number]   [name] 04/29/2013, 4:08 PM

## 2013-04-29 NOTE — Plan of Care (Signed)
Problem: RH SKIN INTEGRITY Goal: RH STG SKIN FREE OF INFECTION/BREAKDOWN Remain free from infection and breakdown while on rehab with min assist.  Outcome: Progressing MD d/ced wound vac. Santyl wet-dry dressing in place

## 2013-04-30 ENCOUNTER — Inpatient Hospital Stay (HOSPITAL_COMMUNITY): Payer: Medicare HMO | Admitting: Physical Therapy

## 2013-04-30 ENCOUNTER — Inpatient Hospital Stay (HOSPITAL_COMMUNITY): Payer: Medicare Other

## 2013-04-30 ENCOUNTER — Inpatient Hospital Stay (HOSPITAL_COMMUNITY): Payer: Medicare HMO

## 2013-04-30 ENCOUNTER — Inpatient Hospital Stay (HOSPITAL_COMMUNITY): Payer: Medicare Other | Admitting: Occupational Therapy

## 2013-04-30 LAB — GLUCOSE, CAPILLARY
GLUCOSE-CAPILLARY: 232 mg/dL — AB (ref 70–99)
Glucose-Capillary: 221 mg/dL — ABNORMAL HIGH (ref 70–99)
Glucose-Capillary: 140 mg/dL — ABNORMAL HIGH (ref 70–99)
Glucose-Capillary: 202 mg/dL — ABNORMAL HIGH (ref 70–99)

## 2013-04-30 NOTE — Progress Notes (Signed)
Inpatient Diabetes Program Recommendations  AACE/ADA: New Consensus Statement on Inpatient Glycemic Control (2013)  Target Ranges:  Prepandial:   less than 140 mg/dL      Peak postprandial:   less than 180 mg/dL (1-2 hours)      Critically ill patients:  140 - 180 mg/dL   Reason for Visit: Hyperglycemia  Results for TORIN, DIEBOLD (MRN [id number]) as of 04/30/2013 15:46  Ref. Range 04/29/2013 11:55 04/29/2013 16:21 04/29/2013 21:27 04/30/2013 07:06 04/30/2013 11:51  Glucose-Capillary Latest Range: 70-99 mg/dL 366 (H) 294 (H) 765 (H) 221 (H) 232 (H)   Continues with hyperglycemia.  Needs basal insulin and meal coverage.  Eating 100% meals.   Inpatient Diabetes Program Recommendations Insulin - Basal: Pt has been on high doses of levemir (45 units bid). Pt is on no basal insulin at present. Please add Levemir 25 units bid and titrate Correction (SSI): xxxx Insulin - Meal Coverage: Add Novolog 4 units tidwc for meal coverage insulin if pt eats >50% meal  Note: Will continue to follow. Thank you. Ailene Ards, RD, LDN, CDE Inpatient Diabetes Coordinator [phone]

## 2013-04-30 NOTE — Progress Notes (Signed)
Physical Therapy Session Note  Patient Details  Name: Leroy [name] MRN: [id number] Date of Birth: [birth year]  Today's Date: 04/30/2013 Time: 1345-1450 Time Calculation (min): 65 min  Skilled Therapeutic Interventions/Progress Updates:    Pt argumentative with most proposed activity but eventually willing to do task. Ambulation hop gait x 20' with bariatric RW and min assist, W/C to follow occasional cue for proximity of RW. Standing balance with one UE support ball toss then reaching task with horseshoes min/mod assist. Pt argumentative with safety precautions of therapist, PT positioning and guarding and refused to scoot away from corner of mat until PT demonstrated how a fall could easily occur. Scoot and stand pivot transfers x 2 reps each, min assist.   Therapy Documentation Precautions:  Precautions Precautions: Fall Precaution Comments: reports falling at home 3-4 times in 6 months. Often falls backwards when using rollator walker particularly on ramp Restrictions Weight Bearing Restrictions: No Other Position/Activity Restrictions: No weight bearing orders, anticipate pt non-weight bearing through amputated extremity will receive clarification.  See FIM for current functional status  Therapy/Group: Individual Therapy  Wilhemina Bonito 04/30/2013, 3:22 PM

## 2013-04-30 NOTE — Progress Notes (Signed)
Physical Therapy Session Note  Patient Details  Name: Leroy [name] MRN: [id number] Date of Birth: 19-Jun-1945  Today's Date: 04/30/2013 Time: 1975-8832 Time Calculation (min): 40 min  Short Term Goals: Week 1:  PT Short Term Goal 1 (Week 1): Pt will transfer bed<> chair with moderate assistance.  PT Short Term Goal 1 - Progress (Week 1): Met PT Short Term Goal 2 (Week 1): Pt will perform 1 min continuous standing with +2 assistance or mechanical lift PT Short Term Goal 2 - Progress (Week 1): Met PT Short Term Goal 3 (Week 1): Pt will perform supine>sit with min assist. PT Short Term Goal 3 - Progress (Week 1): Met  Skilled Therapeutic Interventions/Progress Updates:    Session focused on w/c propulsion for endurance and strengthening, squat pivot and stand pivot transfers with RW with overall min A multiple reps, standing balance and tolerance while completing R hip flexion, extension and abduction for functional strengthening (10 reps x 2 sets each), and bed mobility on flat mat with S. Pt required rest breaks due to fatigue.  Therapy Documentation Precautions:  Precautions Precautions: Fall Precaution Comments: reports falling at home 3-4 times in 6 months. Often falls backwards when using rollator walker particularly on ramp Restrictions Weight Bearing Restrictions: No Other Position/Activity Restrictions: No weight bearing orders, anticipate pt non-weight bearing through amputated extremity will receive clarification.  Pain: C/o 6/10 pain in residual limb - RN notified for pain medication.  See FIM for current functional status  Therapy/Group: Individual Therapy  Canary Br[facility] 04/30/2013, 11:42 AM

## 2013-04-30 NOTE — Progress Notes (Signed)
Subjective/Complaints: No issues. Slept well. Pain getting better  A 12 point review of systems has been performed and if not noted above is otherwise negative.   Objective: Vital Signs: Blood pressure 125/64, pulse 71, temperature 98 F (36.7 C), temperature source Oral, resp. rate 19, weight 129.8 kg (286 lb 2.5 oz), SpO2 97.00%. No results found. No results found for this basename: WBC, HGB, HCT, PLT,  in the last 72 hours No results found for this basename: NA, K, CL, CO, GLUCOSE, BUN, CREATININE, CALCIUM,  in the last 72 hours CBG (last 3)   Recent Labs  04/29/13 1621 04/29/13 2127 04/30/13 0706  GLUCAP 187* 209* 221*    Wt Readings from Last 3 Encounters:  04/28/13 129.8 kg (286 lb 2.5 oz)  04/12/13 129.4 kg (285 lb 4.4 oz)  04/12/13 129.4 kg (285 lb 4.4 oz)    Physical Exam:  Constitutional: He is oriented to person, place, and time. Obese african [place] male in no distress  HENT: dentition poor  Head: Normocephalic.  Eyes: EOM are normal.  Neck: Normal range of motion. Neck supple. No thyromegaly present.  Cardiovascular: Normal rate and regular rhythm. No murmurs or rubs  Respiratory: Effort normal and breath sounds normal except for a few expiratory wheezes. No respiratory distress.  GI: Soft. Bowel sounds are normal. He exhibits no distension.  Neurological: He is alert and oriented to person, place, and time. CN exam normal. Good insight and awareness. UES 5/5 proximal to distal. LLE -HF and KE 4/5. RLE HF and KE 2+ to 3-. No gross sensory deficits.  Skin:  Right BKA site staples intact with minimal  Drainage minimal . Left BKA site well healed/ pt wearing prosthesis. Vac in place right groin   Assessment/Plan: 1. Functional deficits secondary to new right BKA, recent left BKA which require 3+ hours per day of interdisciplinary therapy in a comprehensive inpatient rehab setting. Physiatrist is providing close team supervision and 24 hour management of  active medical problems listed below. Physiatrist and rehab team continue to assess barriers to discharge/monitor patient progress toward functional and medical goals. FIM: FIM - Bathing Bathing Steps Patient Completed: Chest;Right Arm;Left Arm;Abdomen;Front perineal area;Buttocks;Right upper leg;Left upper leg Bathing: 5: Set-up assist to: Obtain items  FIM - Upper Body Dressing/Undressing Upper body dressing/undressing steps patient completed: Thread/unthread right sleeve of pullover shirt/dresss;Thread/unthread left sleeve of pullover shirt/dress;Put head through opening of pull over shirt/dress;Pull shirt over trunk Upper body dressing/undressing: 7: Complete Independence: No helper FIM - Lower Body Dressing/Undressing Lower body dressing/undressing steps patient completed: Thread/unthread right pants leg;Thread/unthread left pants leg;Pull pants up/down;Fasten/unfasten pants Lower body dressing/undressing: 6: More than reasonable amount of time  FIM - Toileting Toileting steps completed by patient: Adjust clothing prior to toileting Toileting: 2: Max-Patient completed 1 of 3 steps  FIM - Diplomatic Services operational officer Devices: Grab bars;Prosthesis Toilet Transfers: 4-To toilet/BSC: Min A (steadying Pt. > 75%)  FIM - Bed/Chair Transfer Bed/Chair Transfer Assistive Devices: Therapist, occupational: 4: Bed > Chair or W/C: Min A (steadying Pt. > 75%);4: Chair or W/C > Bed: Min A (steadying Pt. > 75%)  FIM - Locomotion: Wheelchair Distance: 115 Locomotion: Wheelchair: 2: Travels 50 - 149 ft with supervision, cueing or coaxing FIM - Locomotion: Ambulation Locomotion: Ambulation Assistive Devices: Environmental consultant - Rolling;Prosthesis Ambulation/Gait Assistance: 4: Min assist Locomotion: Ambulation: 1: Two helpers  Comprehension Comprehension Mode: Auditory Comprehension: 6-Follows complex conversation/direction: With extra time/assistive device  Expression Expression Mode:  Verbal Expression: 6-Expresses complex ideas: With  extra time/assistive device  Social Interaction Social Interaction: 6-Interacts appropriately with others with medication or extra time (anti-anxiety, antidepressant).  Problem Solving Problem Solving: 4-Solves basic 75 - 89% of the time/requires cueing 10 - 24% of the time  Memory Memory: 4-Recognizes or recalls 75 - 89% of the time/requires cueing 10 - 24% of the time  Medical Problem List and Plan:  1. 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 Surgeon to reassess today 2. DVT Prophylaxis/Anticoagulation: Xarelto as directed  3. Pain Management: Oxycodone as needed. Monitor with increased mobility   - gabapentin for phantom limb pain  effective 4. Neuropsych: This patient is capable of making decisions on his own behalf.  5. 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 Advanced to follow up for fit/ed 6. Hypertension. Lisinopril 5 mg daily, Toprol 50 mg twice a day. Monitor with increased mobility  7. Diabetes mellitus with peripheral neuropathy. Hemoglobin A1c 12.4.    - Check CBGs a.c. and at bedtime.    -increased amaryl to 4mg ---titrate further pending pattern, SSI   -will try to manage without insulin, however cbg's are climbing  8. PAF.Xarelto. Cardiac rate controlled. Continue Lopressor  9. Hyperlipidemia. Zetia  10. Poor medical compliance. Provide counseling   LOS (Days) 9 A FACE TO FACE EVALUATION WAS PERFORMED  SWARTZ,ZACHARY T 04/30/2013 7:43 AM

## 2013-04-30 NOTE — Progress Notes (Signed)
Occupational Therapy Session Note  Patient Details  Name: DIVIT STIPP MRN: [id number] Date of Birth: 1945-12-23  Today's Date: 04/30/2013 Time: 1000-1030 Time Calculation (min): 30 min  Short Term Goals: Week 1:  OT Short Term Goal 1 (Week 1): Patient will complete bathing and dressing upper body seated with supervision and setup. OT Short Term Goal 1 - Progress (Week 1): Met OT Short Term Goal 2 (Week 1): Patient will complete lower body bathing using AE, prn, with min assist OT Short Term Goal 2 - Progress (Week 1): Met OT Short Term Goal 3 (Week 1): Patient will complete functional transfer, bed <> w/c, with mod assist OT Short Term Goal 3 - Progress (Week 1): Met OT Short Term Goal 4 (Week 1): Patient will complete toileting using LRAD with min assist for thoroughness OT Short Term Goal 4 - Progress (Week 1): Met  Skilled Therapeutic Interventions/Progress Updates:    1:1 Focus on stand pivot transfers with RW and kitchen simple meal prep task of making a scrambled egg on the stove at w/c level. Focus on safe sit to stands, standing balance, safe w/c mobility, and reaching for items at different heights.   Therapy Documentation Precautions:  Precautions Precautions: Fall Precaution Comments: reports falling at home 3-4 times in 6 months. Often falls backwards when using rollator walker particularly on ramp Restrictions Weight Bearing Restrictions: No Other Position/Activity Restrictions: No weight bearing orders, anticipate pt non-weight bearing through amputated extremity will receive clarification. Pain:  no c/o pain   See FIM for current functional status  Therapy/Group: Individual Therapy  Willeen Cass [facility] 04/30/2013, 2:53 PM

## 2013-04-30 NOTE — Progress Notes (Signed)
Occupational Therapy Session and Weekly Progress Note  Patient Details  Name: Leroy [name] MRN: [id number] Date of Birth: Jan 20, 1946  Today's Date: 04/30/2013 Time: 0730-0828 Time Calculation (min): 58 min  Patient has met 4 of 4 short term goals.    Patient continues to demonstrate the following deficits: Impaired dynamic standing balance, impaired endurance, UE weakness, safety awareness and therefore will continue to benefit from skilled OT intervention to enhance overall performance with BADL and iADL.  Patient progressing toward long term goals..  Continue plan of care.  OT Short Term Goals Week 1:  OT Short Term Goal 1 (Week 1): Patient will complete bathing and dressing upper body seated with supervision and setup. OT Short Term Goal 1 - Progress (Week 1): Met OT Short Term Goal 2 (Week 1): Patient will complete lower body bathing using AE, prn, with min assist OT Short Term Goal 2 - Progress (Week 1): Met OT Short Term Goal 3 (Week 1): Patient will complete functional transfer, bed <> w/c, with mod assist OT Short Term Goal 3 - Progress (Week 1): Met OT Short Term Goal 4 (Week 1): Patient will complete toileting using LRAD with min assist for thoroughness OT Short Term Goal 4 - Progress (Week 1): Met Week 2:  OT Short Term Goal 1 (Week 2): Patient will maintain supported dynamic standing balance during ADL with supervision for safety OT Short Term Goal 2 (Week 2): Pateint will prepare simple meal at w/c level with supervision for safety OT Short Term Goal 3 (Week 2): Patient will demo ability to complete HEP for endurance and UE strength independently OT Short Term Goal 4 (Week 2): Patient will complete bathing and dressing sitting at edge of bed independently  Skilled Therapeutic Interventions:  ADL-retraining with focus on adapted bathing/dressing skills (sitting at edge of bed), and dynamic standing balance during ADL.   Patient completed seated bathing and dressing with  only setup assist and transferred to w/c, performing squat pivot transfer with standby assist.   Patient groomed in w/c at sink and was challenged to complete light housekeeping task: load laundry into washer.   Patient ambulated through doorway to washer 3' but reported fatigue and need to return to w/c.   Patient loaded washer at w/c level with verbal cues to operate machine after setup assist.     Therapy Documentation Precautions:  Precautions Precautions: Fall Precaution Comments: reports falling at home 3-4 times in 6 months. Often falls backwards when using rollator walker particularly on ramp Restrictions Weight Bearing Restrictions: No Other Position/Activity Restrictions: No weight bearing orders, anticipate pt non-weight bearing through amputated extremity will receive clarification.  Vital Signs: Therapy Vitals Temp: 98 F (36.7 C) Temp src: Oral Pulse Rate: 71 Resp: 19 BP: 125/64 mmHg Patient Position, if appropriate: Lying Oxygen Therapy SpO2: 97 % O2 Device: None (Room air)  Pain: Pain Assessment Pain Assessment: 0-10 Pain Score: 3  Pain Type: Surgical pain Pain Location: Groin Pain Orientation: Right Pain Intervention(s): Medication (See eMAR)  ADL: ADL ADL Comments:  (see FIM)  See FIM for current functional status  Therapy/Group: Individual Therapy  Kharizma Lesnick 04/30/2013, 8:29 AM

## 2013-05-01 ENCOUNTER — Inpatient Hospital Stay (HOSPITAL_COMMUNITY): Payer: Medicare HMO | Admitting: Occupational Therapy

## 2013-05-01 LAB — GLUCOSE, CAPILLARY
GLUCOSE-CAPILLARY: 217 mg/dL — AB (ref 70–99)
Glucose-Capillary: 244 mg/dL — ABNORMAL HIGH (ref 70–99)
Glucose-Capillary: 205 mg/dL — ABNORMAL HIGH (ref 70–99)
Glucose-Capillary: 261 mg/dL — ABNORMAL HIGH (ref 70–99)

## 2013-05-01 MED ORDER — METFORMIN HCL 500 MG PO TABS
500.0000 mg | ORAL_TABLET | Freq: Two times a day (BID) | ORAL | Status: DC
  Administered 2013-05-01 – 2013-05-07 (×12): 500 mg via ORAL
  Filled 2013-05-01 (×14): qty 1

## 2013-05-01 NOTE — Progress Notes (Signed)
Subjective/Complaints: Vac out, area a little tender  A 12 point review of systems has been performed and if not noted above is otherwise negative.   Objective: Vital Signs: Blood pressure 127/81, pulse 75, temperature 97.7 F (36.5 C), temperature source Oral, resp. rate 19, weight 129.8 kg (286 lb 2.5 oz), SpO2 95.00%. No results found. No results found for this basename: WBC, HGB, HCT, PLT,  in the last 72 hours No results found for this basename: NA, K, CL, CO, GLUCOSE, BUN, CREATININE, CALCIUM,  in the last 72 hours CBG (last 3)   Recent Labs  04/30/13 1652 04/30/13 2045 05/01/13 0711  GLUCAP 140* 202* 244*    Wt Readings from Last 3 Encounters:  04/28/13 129.8 kg (286 lb 2.5 oz)  04/12/13 129.4 kg (285 lb 4.4 oz)  04/12/13 129.4 kg (285 lb 4.4 oz)    Physical Exam:  Constitutional: He is oriented to person, place, and time. Obese african [place] male in no distress  HENT: dentition poor  Head: Normocephalic.  Eyes: EOM are normal.  Neck: Normal range of motion. Neck supple. No thyromegaly present.  Cardiovascular: Normal rate and regular rhythm. No murmurs or rubs  Respiratory: Effort normal and breath sounds normal except for a few expiratory wheezes. No respiratory distress.  GI: Soft. Bowel sounds are normal. He exhibits no distension.  Neurological: He is alert and oriented to person, place, and time. CN exam normal. Good insight and awareness. UES 5/5 proximal to distal. LLE -HF and KE 4/5. RLE HF and KE 2+ to 3-. No gross sensory deficits.  Skin:  Right BKA site staples intact with minimal  Drainage minimal . Left BKA site well healed/ pt wearing prosthesis. Vac in place right groin   Assessment/Plan: 1. Functional deficits secondary to new right BKA, recent left BKA which require 3+ hours per day of interdisciplinary therapy in a comprehensive inpatient rehab setting. Physiatrist is providing close team supervision and 24 hour management of active medical  problems listed below. Physiatrist and rehab team continue to assess barriers to discharge/monitor patient progress toward functional and medical goals. FIM: FIM - Bathing Bathing Steps Patient Completed: Chest;Right Arm;Left Arm;Abdomen;Front perineal area;Buttocks;Right upper leg;Left upper leg;Right lower leg (including foot);Left lower leg (including foot) Bathing: 5: Set-up assist to: Obtain items  FIM - Upper Body Dressing/Undressing Upper body dressing/undressing steps patient completed: Thread/unthread right sleeve of pullover shirt/dresss;Thread/unthread left sleeve of pullover shirt/dress;Put head through opening of pull over shirt/dress;Pull shirt over trunk Upper body dressing/undressing: 5: Set-up assist to: Obtain clothing/put away FIM - Lower Body Dressing/Undressing Lower body dressing/undressing steps patient completed: Thread/unthread right pants leg;Thread/unthread left pants leg;Pull pants up/down Lower body dressing/undressing: 5: Set-up assist to: Obtain clothing  FIM - Toileting Toileting steps completed by patient: Adjust clothing prior to toileting Toileting: 2: Max-Patient completed 1 of 3 steps  FIM - Diplomatic Services operational officerToilet Transfers Toilet Transfers Assistive Devices: Grab bars;Prosthesis Toilet Transfers: 4-To toilet/BSC: Min A (steadying Pt. > 75%)  FIM - BankerBed/Chair Transfer Bed/Chair Transfer Assistive Devices: Walker;Prosthesis;Arm rests Bed/Chair Transfer: 4: Bed > Chair or W/C: Min A (steadying Pt. > 75%);4: Chair or W/C > Bed: Min A (steadying Pt. > 75%)  FIM - Locomotion: Wheelchair Distance: 115 Locomotion: Wheelchair: 2: Travels 50 - 149 ft with supervision, cueing or coaxing FIM - Locomotion: Ambulation Locomotion: Ambulation Assistive Devices: Walker - Rolling;Prosthesis Ambulation/Gait Assistance: 4: Min assist Locomotion: Ambulation: 1: Travels less than 50 ft with minimal assistance (Pt.>75%)  Comprehension Comprehension Mode: Auditory Comprehension:  6-Follows complex  conversation/direction: With extra time/assistive device  Expression Expression Mode: Verbal Expression: 6-Expresses complex ideas: With extra time/assistive device  Social Interaction Social Interaction: 6-Interacts appropriately with others with medication or extra time (anti-anxiety, antidepressant).  Problem Solving Problem Solving: 4-Solves basic 75 - 89% of the time/requires cueing 10 - 24% of the time  Memory Memory: 4-Recognizes or recalls 75 - 89% of the time/requires cueing 10 - 24% of the time  Medical Problem List and Plan:  1. 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 DVT Prophylaxis/Anticoagulation: Xarelto as directed  3. Pain Management: Oxycodone as needed. Monitor with increased mobility   - gabapentin for phantom limb pain  effective 4. Neuropsych: This patient is capable of making decisions on his own behalf.  5. 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 Advanced to follow up for fit/ed 6. Hypertension. Lisinopril 5 mg daily, Toprol 50 mg twice a day. Monitor with increased mobility  7. Diabetes mellitus with peripheral neuropathy. Hemoglobin A1c 12.4.    - Check CBGs a.c. and at bedtime.    -increased amaryl to 4mg ---titrate further pending pattern, add glucophage bid,  SSI   -will try to manage without insulin, however cbg's are climbing  8. PAF.Xarelto. Cardiac rate controlled. Continue Lopressor  9. Hyperlipidemia. Zetia  10. Poor medical compliance. Provide counseling   LOS (Days) 10 A FACE TO FACE EVALUATION WAS PERFORMED  Marikay Roads T 05/01/2013 8:35 AM

## 2013-05-01 NOTE — Progress Notes (Signed)
Occupational Therapy Session Note  Patient Details  Name: Leroy [name] MRN: [id number] Date of Birth: 1945-06-14  Today's Date: 05/01/2013 Time: 0800-0845 Time Calculation (min): 45 min  Short Term Goals: Week 1:  OT Short Term Goal 1 (Week 1): Patient will complete bathing and dressing upper body seated with supervision and setup. OT Short Term Goal 1 - Progress (Week 1): Met OT Short Term Goal 2 (Week 1): Patient will complete lower body bathing using AE, prn, with min assist OT Short Term Goal 2 - Progress (Week 1): Met OT Short Term Goal 3 (Week 1): Patient will complete functional transfer, bed <> w/c, with mod assist OT Short Term Goal 3 - Progress (Week 1): Met OT Short Term Goal 4 (Week 1): Patient will complete toileting using LRAD with min assist for thoroughness OT Short Term Goal 4 - Progress (Week 1): Met  Week 2:  OT Short Term Goal 1 (Week 2): Patient will maintain supported dynamic standing balance during ADL with supervision for safety OT Short Term Goal 2 (Week 2): Pateint will prepare simple meal at w/c level with supervision for safety OT Short Term Goal 3 (Week 2): Patient will demo ability to complete HEP for endurance and UE strength independently OT Short Term Goal 4 (Week 2): Patient will complete bathing and dressing sitting at edge of bed independently  Skilled Therapeutic Interventions/Progress Updates:  Upon entering room, patient found seated edge of bed eating breakfast with 7/10 complaints of pain in right residual limb; RN made aware. Therapist assisted patient with set-up of basin & clothes for UB/LB bathing & dressing seated EOB. Set-up for patient to don prosthesis, then patient transferred EOB > w/c using RW with close supervision. From here, patient maneuvered > sink side for grooming tasks of washing face, brushing teeth and shaving independently. At end of session, left patient seated in w/c at sink with all needed items within reach.    Precautions:  Precautions Precautions: Fall Precaution Comments: reports falling at home 3-4 times in 6 months. Often falls backwards when using rollator walker particularly on ramp Restrictions Weight Bearing Restrictions: No Other Position/Activity Restrictions: No weight bearing orders, anticipate pt non-weight bearing through amputated extremity will receive clarification.  See FIM for current functional status  Therapy/Group: Individual Therapy  Alister Staver 05/01/2013, 8:46 AM

## 2013-05-02 ENCOUNTER — Inpatient Hospital Stay (HOSPITAL_COMMUNITY): Payer: Medicare Other | Admitting: Physical Therapy

## 2013-05-02 ENCOUNTER — Inpatient Hospital Stay (HOSPITAL_COMMUNITY): Payer: Medicare Other

## 2013-05-02 LAB — GLUCOSE, CAPILLARY
Glucose-Capillary: 180 mg/dL — ABNORMAL HIGH (ref 70–99)
Glucose-Capillary: 197 mg/dL — ABNORMAL HIGH (ref 70–99)
Glucose-Capillary: 182 mg/dL — ABNORMAL HIGH (ref 70–99)
Glucose-Capillary: 182 mg/dL — ABNORMAL HIGH (ref 70–99)

## 2013-05-02 NOTE — Progress Notes (Signed)
Occupational Therapy Session Note  Patient Details  Name: Leroy [name] MRN: [id number] Date of Birth: 20-Jul-1945  Today's Date: 05/02/2013 Time: 1500-1530 Time Calculation (min): 30 min  Short Term Goals: Week 2:  OT Short Term Goal 1 (Week 2): Patient will maintain supported dynamic standing balance during ADL with supervision for safety OT Short Term Goal 2 (Week 2): Pateint will prepare simple meal at w/c level with supervision for safety OT Short Term Goal 3 (Week 2): Patient will demo ability to complete HEP for endurance and UE strength independently OT Short Term Goal 4 (Week 2): Patient will complete bathing and dressing sitting at edge of bed independently  Skilled Therapeutic Interventions/Progress Updates: ADL-retraining with emphasis on supported standing balance during ADL.   Patient received in his room, sitting in his recliner.   Patient reported that he had successfully completed toiletng (BM) prior to treatment despite noticeable odor remaining.   Patient claims that he has learned to wipe himself from the front with good thoroughness and he accepted challenge to demonstrate his method.   Patient transferred from recliner to w/c with steadying assist to secure recliner (pt performed sit>stand unassisted).  From w/c, patient performed stand-pivot transfer using grab bar and bariatric DA BSC over toilet, also unassisted.   Patient completed clothing management while standing supported by grab bar with only supervision assist and then returned to sitting on BSC to demo his technique.   OT provided wash cloth and noted fecal matter on cloth for 3 consecutive wipes, as well as fecal matter in patient's underwear.   OT then provided min assist to wash buttocks while patient stood again, supporting himself with grab bar.   OT then re-educated patient on recommendation to finish toileting with disposable wipes and/or warm washcloth.    Therapy Documentation Precautions:   Precautions Precautions: Fall Precaution Comments: reports falling at home 3-4 times in 6 months. Often falls backwards when using rollator walker particularly on ramp Restrictions Weight Bearing Restrictions: No Other Position/Activity Restrictions: No weight bearing orders, anticipate pt non-weight bearing through amputated extremity will receive clarification.   Pain: Pain Assessment Pain Assessment: No/denies pain See FIM for current functional status  Therapy/Group: Individual Therapy  Holston Oyama 05/02/2013, 3:31 PM

## 2013-05-02 NOTE — Progress Notes (Signed)
Subjective/Complaints: Had some drainage from stump yesterday.  A 12 point review of systems has been performed and if not noted above is otherwise negative.   Objective: Vital Signs: Blood pressure 132/59, pulse 77, temperature 98.6 F (37 C), temperature source Oral, resp. rate 20, weight 129.8 kg (286 lb 2.5 oz), SpO2 94.00%. No results found. No results found for this basename: WBC, HGB, HCT, PLT,  in the last 72 hours No results found for this basename: NA, K, CL, CO, GLUCOSE, BUN, CREATININE, CALCIUM,  in the last 72 hours CBG (last 3)   Recent Labs  05/01/13 1604 05/01/13 2044 05/02/13 0708  GLUCAP 261* 217* 180*    Wt Readings from Last 3 Encounters:  04/28/13 129.8 kg (286 lb 2.5 oz)  04/12/13 129.4 kg (285 lb 4.4 oz)  04/12/13 129.4 kg (285 lb 4.4 oz)    Physical Exam:  Constitutional: He is oriented to person, place, and time. Obese african [place] male in no distress  HENT: dentition poor  Head: Normocephalic.  Eyes: EOM are normal.  Neck: Normal range of motion. Neck supple. No thyromegaly present.  Cardiovascular: Normal rate and regular rhythm. No murmurs or rubs  Respiratory: Effort normal and breath sounds normal except for a few expiratory wheezes. No respiratory distress.  GI: Soft. Bowel sounds are normal. He exhibits no distension.  Neurological: He is alert and oriented to person, place, and time. CN exam normal. Good insight and awareness. UES 5/5 proximal to distal. LLE -HF and KE 4/5. RLE HF and KE 2+ to 3-. No gross sensory deficits.  Skin:  Right BKA site staples intact with minimal drainage on dressing . Left BKA site well healed/ pt wearing prosthesis. Vac in place right groin   Assessment/Plan: 1. Functional deficits secondary to new right BKA, recent left BKA which require 3+ hours per day of interdisciplinary therapy in a comprehensive inpatient rehab setting. Physiatrist is providing close team supervision and 24 hour management of  active medical problems listed below. Physiatrist and rehab team continue to assess barriers to discharge/monitor patient progress toward functional and medical goals. FIM: FIM - Bathing Bathing Steps Patient Completed: Chest;Right Arm;Left Arm;Abdomen;Front perineal area;Buttocks;Right upper leg;Left upper leg Bathing: 5: Set-up assist to: Obtain items  FIM - Upper Body Dressing/Undressing Upper body dressing/undressing steps patient completed: Thread/unthread right sleeve of pullover shirt/dresss;Thread/unthread left sleeve of pullover shirt/dress;Put head through opening of pull over shirt/dress;Pull shirt over trunk Upper body dressing/undressing: 5: Set-up assist to: Obtain clothing/put away FIM - Lower Body Dressing/Undressing Lower body dressing/undressing steps patient completed: Thread/unthread right pants leg;Thread/unthread left pants leg;Pull pants up/down (+donned left prosthesis) Lower body dressing/undressing: 5: Set-up assist to: Obtain clothing  FIM - Toileting Toileting steps completed by patient: Adjust clothing prior to toileting Toileting: 0: Activity did not occur  FIM - Diplomatic Services operational officer Devices: Grab bars;Prosthesis Toilet Transfers: 0-Activity did not occur  FIM - Banker Devices: Prosthesis;Arm rests Bed/Chair Transfer: 6: Supine > Sit: No assist;5: Bed > Chair or W/C: Supervision (verbal cues/safety issues)  FIM - Locomotion: Wheelchair Distance: 115 Locomotion: Wheelchair: 2: Travels 50 - 149 ft with supervision, cueing or coaxing FIM - Locomotion: Ambulation Locomotion: Ambulation Assistive Devices: Walker - Rolling;Prosthesis Ambulation/Gait Assistance: 4: Min assist Locomotion: Ambulation: 1: Travels less than 50 ft with minimal assistance (Pt.>75%)  Comprehension Comprehension Mode: Auditory Comprehension: 6-Follows complex conversation/direction: With extra time/assistive  device  Expression Expression Mode: Verbal Expression: 6-Expresses complex ideas: With extra time/assistive device  Social Interaction Social Interaction: 6-Interacts appropriately with others with medication or extra time (anti-anxiety, antidepressant).  Problem Solving Problem Solving: 5-Solves basic problems: With no assist  Memory Memory: 6-More than reasonable amt of time  Medical Problem List and Plan:  1. 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 DVT Prophylaxis/Anticoagulation: Xarelto   3. Pain Management: Oxycodone as needed. Monitor with increased mobility   - gabapentin for phantom limb pain  effective 4. Neuropsych: This patient is capable of making decisions on his own behalf.  5. 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 Advanced to follow up for fit/ed 6. Hypertension. Lisinopril 5 mg daily, Toprol 50 mg twice a day. Monitor with increased mobility  7. Diabetes mellitus with peripheral neuropathy. Hemoglobin A1c 12.4.    - Check CBGs a.c. and at bedtime.    -increased amaryl to 4mg ---titrate further pending pattern, added glucophage bid,  SSI   -will try to manage without insulin, however cbg's are climbing  8. PAF.Xarelto. Cardiac rate controlled. Continue Lopressor  9. Hyperlipidemia. Zetia  10. Poor medical compliance. Provide counseling   LOS (Days) 11 A FACE TO FACE EVALUATION WAS PERFORMED  SWARTZ,ZACHARY T 05/02/2013 7:45 AM

## 2013-05-03 ENCOUNTER — Inpatient Hospital Stay (HOSPITAL_COMMUNITY): Payer: Medicare Other | Admitting: Physical Therapy

## 2013-05-03 ENCOUNTER — Inpatient Hospital Stay (HOSPITAL_COMMUNITY): Payer: Medicare HMO

## 2013-05-03 DIAGNOSIS — I739 Peripheral vascular disease, unspecified: Secondary | ICD-10-CM

## 2013-05-03 DIAGNOSIS — L98499 Non-pressure chronic ulcer of skin of other sites with unspecified severity: Secondary | ICD-10-CM

## 2013-05-03 DIAGNOSIS — IMO0001 Reserved for inherently not codable concepts without codable children: Secondary | ICD-10-CM

## 2013-05-03 DIAGNOSIS — I10 Essential (primary) hypertension: Secondary | ICD-10-CM

## 2013-05-03 DIAGNOSIS — E1165 Type 2 diabetes mellitus with hyperglycemia: Secondary | ICD-10-CM

## 2013-05-03 DIAGNOSIS — I4891 Unspecified atrial fibrillation: Secondary | ICD-10-CM

## 2013-05-03 DIAGNOSIS — S88119A Complete traumatic amputation at level between knee and ankle, unspecified lower leg, initial encounter: Secondary | ICD-10-CM

## 2013-05-03 LAB — GLUCOSE, CAPILLARY
GLUCOSE-CAPILLARY: 142 mg/dL — AB (ref 70–99)
Glucose-Capillary: 227 mg/dL — ABNORMAL HIGH (ref 70–99)
Glucose-Capillary: 279 mg/dL — ABNORMAL HIGH (ref 70–99)
Glucose-Capillary: 361 mg/dL — ABNORMAL HIGH (ref 70–99)

## 2013-05-03 MED ORDER — INSULIN ASPART 100 UNIT/ML ~~LOC~~ SOLN
4.0000 [IU] | Freq: Once | SUBCUTANEOUS | Status: AC
  Administered 2013-05-03: 4 [IU] via SUBCUTANEOUS

## 2013-05-03 NOTE — Progress Notes (Signed)
Physical Therapy Session Note  Patient Details  Name: Leroy [name] MRN: [id number] Date of Birth: [birth year]  Today's Date: 05/03/2013 Time: 0830-0930 Time Calculation (min): 60 min  Skilled Therapeutic Interventions/Progress Updates:   Good session. Low couch transfer performed with overall min assist however +2 needed for close assist with transfer from low couch >wheelchair. PT to stabilize w/c. Discussed benefits of furniture risers, pt agreed they would be beneficial. Transfer W/C to/from truck height x 2 reps with mod cues for safe hand placement, min cues for w/c set-up including for locking brakes. Switched w/c to smaller width for improved fit. Ambulation (hop gait) with bariatric RW x 30' with min assist and w/c to follow.  Prosthesis on entire session. Discussed home set-up, bathroom width still an issue as pt will be unable to access via w/c or RW. Discussed daily routine and modifications given current level of function. Reiterated importance of positioning of Rt. Knee into extension throughout the day, pt does not adhere to this well instead reports he will be "fine" because he sleeps with his knee in extension.   Second Session Time:  6060-0459  Time Calculation (min): 40 min Skilled Therapeutic Interventions/Progress Updates:  Session focused on home prep. Pt practiced with standard RW and wide regular weight RW, hand increased difficulty with both as compared to bariatric RW however pt will not be able to afford bariatric RW. Practiced side hopping with RW, min assist for balance and very taxing for pt. Pt in agreement that ambulation (side hop to fit through door) not safe and pt has agreed to stay out of bathroom once home. Wheelchair parts management with supervision, increased time.   Therapy Documentation Precautions:  Precautions Precautions: Fall Precaution Comments: reports falling at home 3-4 times in 6 months. Often falls backwards when using rollator walker  particularly on ramp Restrictions Weight Bearing Restrictions: No Other Position/Activity Restrictions: No weight bearing orders, anticipate pt non-weight bearing through amputated extremity will receive clarification. Pain: Pain Assessment Pain Assessment: No/denies pain Pain Score: 5 both sessions Pain Type: Surgical pain Pain Location: Leg Pain Orientation: Right Repositioned  See FIM for current functional status  Therapy/Group: Individual Therapy both sessions  Wilhemina Bonito 05/03/2013, 12:19 PM

## 2013-05-03 NOTE — Progress Notes (Signed)
Occupational Therapy Session Note  Patient Details  Name: Leroy [name] MRN: [id number] Date of Birth: 05-Feb-1946  Today's Date: 05/03/2013 Time: 0730-0830 Time Calculation (min): 60 min  Short Term Goals: Week 2:  OT Short Term Goal 1 (Week 2): Patient will maintain supported dynamic standing balance during ADL with supervision for safety OT Short Term Goal 2 (Week 2): Pateint will prepare simple meal at w/c level with supervision for safety OT Short Term Goal 3 (Week 2): Patient will demo ability to complete HEP for endurance and UE strength independently OT Short Term Goal 4 (Week 2): Patient will complete bathing and dressing sitting at edge of bed independently  Skilled Therapeutic Interventions: ADL-retraining with focus on problem-solving, safety awareness, use of DME in prep for discharge, transfers and endurance.  Patient consistently able to rise from supine to sitting at edge of bed unassisted and was received sitting at edge of bed, consuming his breakfast.   Patient was challenged to demo ability to complete typical bathing using standard tub and tub bench this session.   Patient was unable to complete stand-pivot transfer to w/c from bed after 3 attempts but manage squat-pivot transfer unassisted.  OT then escorted patient to practice tub and reviewed his plan to use steel folding chairs within his bathroom at home to access bathtub and toilet in bathroom.   Patient was unable to articulate a method to simulate his bathroom configuration during practice bathing session and simulation was therefore terminated.   Patient was able to complete transfer from w/c to tub bench, perform all bathing tasks while seated on bench, using lateral leans for buttocks, but has not yet fully defined how he will enter his bathroom at his home.  Patient transfers on/off w/c with supervision although still requiring cues for sequencing and safety when transferring on/off tub bench.   Patient completed  dressing while sitting in his w/c, standing up using grab bar to pull up his underwear and pants partially (pt=90%).  Patient returned to his room at end of session; call light placed within reach.        Therapy Documentation Precautions:  Precautions Precautions: Fall Precaution Comments: reports falling at home 3-4 times in 6 months. Often falls backwards when using rollator walker particularly on ramp Restrictions Weight Bearing Restrictions: No Other Position/Activity Restrictions: No weight bearing orders, anticipate pt non-weight bearing through amputated extremity will receive clarification.  Pain: Pain Assessment Pain Assessment: No/denies pain  ADL: ADL ADL Comments:  (see FIM)  See FIM for current functional status  Therapy/Group: Individual Therapy  Second session: Time: 1300-1345 Time Calculation (min):  45 min  Pain Assessment: No pain  Skilled Therapeutic Interventions: ADL-retraining with emphasis on discharge planning, re-ed on planned use of DME, and fall recovery.   After prolonged re-ed, patient resolved to retain the bedside commode he had already acquired and defer use of tub bench and bathroom until such time that he could improve access to his bathroom.  Patient admitted to 1 fall at home, in his bathroom, after misjudging distances while using his RW.   From his room, patient was escorted to ADL apartment and educated on fall recovery method.  After 1 demonstration, patient was assisted to mat on floor and recovered from floor to love seat, unassisted, demonstrating ability to roll to either side, assume quadruped, and pull up to transitional surface (loveseat) as needed, although using moderate exertion.   See FIM for current functional status  Therapy/Group: Individual Therapy   Donzetta KohutBARTHOLD,Arabel Barcenas  05/03/2013, 8:40 AM

## 2013-05-03 NOTE — Progress Notes (Signed)
Subjective/Complaints: No new problems. Pain controlled. Sugars better per patient.  A 12 point review of systems has been performed and if not noted above is otherwise negative.   Objective: Vital Signs: Blood pressure 118/64, pulse 60, temperature 97.3 F (36.3 C), temperature source Oral, resp. rate 19, weight 129.8 kg (286 lb 2.5 oz), SpO2 97.00%. No results found. No results found for this basename: WBC, HGB, HCT, PLT,  in the last 72 hours No results found for this basename: NA, K, CL, CO, GLUCOSE, BUN, CREATININE, CALCIUM,  in the last 72 hours CBG (last 3)   Recent Labs  05/02/13 1606 05/02/13 2108 05/03/13 0715  GLUCAP 182* 182* 227*    Wt Readings from Last 3 Encounters:  04/28/13 129.8 kg (286 lb 2.5 oz)  04/12/13 129.4 kg (285 lb 4.4 oz)  04/12/13 129.4 kg (285 lb 4.4 oz)    Physical Exam:  Constitutional: He is oriented to person, place, and time. Obese african [place] male in no distress  HENT: dentition poor  Head: Normocephalic.  Eyes: EOM are normal.  Neck: Normal range of motion. Neck supple. No thyromegaly present.  Cardiovascular: Normal rate and regular rhythm. No murmurs or rubs  Respiratory: Effort normal and breath sounds normal except for a few expiratory wheezes. No respiratory distress.  GI: Soft. Bowel sounds are normal. He exhibits no distension.  Neurological: He is alert and oriented to person, place, and time. CN exam normal. Good insight and awareness. UES 5/5 proximal to distal. LLE -HF and KE 4/5. RLE HF and KE 2+ to 3-. No gross sensory deficits.  Skin:  Right BKA site staples intact with minimal drainage on dressing . Left BKA site well healed/ pt wearing prosthesis. Vac in place right groin   Assessment/Plan: 1. Functional deficits secondary to new right BKA, recent left BKA which require 3+ hours per day of interdisciplinary therapy in a comprehensive inpatient rehab setting. Physiatrist is providing close team supervision and 24  hour management of active medical problems listed below. Physiatrist and rehab team continue to assess barriers to discharge/monitor patient progress toward functional and medical goals. FIM: FIM - Bathing Bathing Steps Patient Completed: Chest;Right Arm;Left Arm;Abdomen;Front perineal area;Buttocks;Right upper leg;Left upper leg Bathing: 5: Set-up assist to: Obtain items  FIM - Upper Body Dressing/Undressing Upper body dressing/undressing steps patient completed: Thread/unthread right sleeve of pullover shirt/dresss;Thread/unthread left sleeve of pullover shirt/dress;Put head through opening of pull over shirt/dress;Pull shirt over trunk Upper body dressing/undressing: 5: Set-up assist to: Obtain clothing/put away FIM - Lower Body Dressing/Undressing Lower body dressing/undressing steps patient completed: Thread/unthread right pants leg;Thread/unthread left pants leg;Pull pants up/down (+donned left prosthesis) Lower body dressing/undressing: 5: Set-up assist to: Obtain clothing  FIM - Toileting Toileting steps completed by patient: Adjust clothing prior to toileting Toileting: 0: Activity did not occur  FIM - Diplomatic Services operational officerToilet Transfers Toilet Transfers Assistive Devices: Grab bars;Prosthesis Toilet Transfers: 0-Activity did not occur  FIM - BankerBed/Chair Transfer Bed/Chair Transfer Assistive Devices: Prosthesis;Arm rests Bed/Chair Transfer: 6: Supine > Sit: No assist;5: Bed > Chair or W/C: Supervision (verbal cues/safety issues)  FIM - Locomotion: Wheelchair Distance: 115 Locomotion: Wheelchair: 2: Travels 50 - 149 ft with supervision, cueing or coaxing FIM - Locomotion: Ambulation Locomotion: Ambulation Assistive Devices: Walker - Rolling;Prosthesis Ambulation/Gait Assistance: 4: Min assist Locomotion: Ambulation: 1: Travels less than 50 ft with minimal assistance (Pt.>75%)  Comprehension Comprehension Mode: Auditory Comprehension: 6-Follows complex conversation/direction: With extra  time/assistive device  Expression Expression Mode: Verbal Expression: 6-Expresses complex ideas: With extra  time/assistive device  Social Interaction Social Interaction: 6-Interacts appropriately with others with medication or extra time (anti-anxiety, antidepressant).  Problem Solving Problem Solving: 5-Solves basic problems: With no assist  Memory Memory: 6-More than reasonable amt of time  Medical Problem List and Plan:  1. 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 DVT Prophylaxis/Anticoagulation: Xarelto   3. Pain Management: Oxycodone as needed. Monitor with increased mobility   - gabapentin for phantom limb pain  effective 4. Neuropsych: This patient is capable of making decisions on his own behalf.  5. 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 Advanced to follow up for fit/ed 6. Hypertension. Lisinopril 5 mg daily, Toprol 50 mg twice a day. Monitor with increased mobility  7. Diabetes mellitus with peripheral neuropathy. Hemoglobin A1c 12.4.    - Checking CBGs a.c. and at bedtime.    -increased amaryl to 4mg ---titrate further pending pattern, added glucophage bid,  SSI   -sugars improving- i'm hopeful he can avoid insulin  8. PAF.Xarelto. Cardiac rate controlled. Continue Lopressor  9. Hyperlipidemia. Zetia  10. Poor medical compliance. Provide counseling   LOS (Days) 12 A FACE TO FACE EVALUATION WAS PERFORMED  SWARTZ,ZACHARY T 05/03/2013 7:56 AM

## 2013-05-03 NOTE — Consult Note (Signed)
WOC service will sign off at this time. VVS is managing all wounds, VAC discontinued last week.   Re consult if needed, will not follow at this time. Thanks  Zadiel Leyh [employer], CWOCN [phone])

## 2013-05-04 ENCOUNTER — Inpatient Hospital Stay (HOSPITAL_COMMUNITY): Payer: Medicare HMO | Admitting: Occupational Therapy

## 2013-05-04 ENCOUNTER — Inpatient Hospital Stay (HOSPITAL_COMMUNITY): Payer: Medicare Other | Admitting: Physical Therapy

## 2013-05-04 ENCOUNTER — Inpatient Hospital Stay (HOSPITAL_COMMUNITY): Payer: Medicare Other

## 2013-05-04 ENCOUNTER — Inpatient Hospital Stay (HOSPITAL_COMMUNITY): Payer: Medicare HMO

## 2013-05-04 ENCOUNTER — Inpatient Hospital Stay (HOSPITAL_COMMUNITY): Payer: Medicare HMO | Admitting: Physical Therapy

## 2013-05-04 LAB — GLUCOSE, CAPILLARY
GLUCOSE-CAPILLARY: 253 mg/dL — AB (ref 70–99)
Glucose-Capillary: 212 mg/dL — ABNORMAL HIGH (ref 70–99)
Glucose-Capillary: 140 mg/dL — ABNORMAL HIGH (ref 70–99)
Glucose-Capillary: 142 mg/dL — ABNORMAL HIGH (ref 70–99)

## 2013-05-04 MED ORDER — GLIMEPIRIDE 4 MG PO TABS
8.0000 mg | ORAL_TABLET | Freq: Every day | ORAL | Status: DC
  Administered 2013-05-04 – 2013-05-07 (×4): 8 mg via ORAL
  Filled 2013-05-04 (×5): qty 2

## 2013-05-04 NOTE — Progress Notes (Signed)
Physical Therapy Note  Patient Details  Name: RAEMON SUNDY MRN: [id number] Date of Birth: Mar 25, 1946 Today's Date: 05/04/2013  Time: 1008-1048 40 minutes  1:1 no c/o pain.  Scoot transfers with supervision, cues for safety.  W/c mobility with supervision >200' with 1 rest break.  Gait 2 x 20' with bariatric RW with close supervision-min A when fatigued.  Simulated truck transfer with min A, cues for safety and sequencing.  Pt requires encouragement to participate and education on importance of continued strengthening and practice with functional mobility.   DONAWERTH,KAREN 05/04/2013, 10:48 AM

## 2013-05-04 NOTE — Progress Notes (Signed)
Occupational Therapy Session Note  Patient Details  Name: Leroy [name] MRN: [id number] Date of Birth: [birth year]  Today's Date: 05/04/2013 Time: 0900-0930 Time Calculation (min): 30 min  Short Term Goals: Week 1:  OT Short Term Goal 1 (Week 1): Patient will complete bathing and dressing upper body seated with supervision and setup. OT Short Term Goal 1 - Progress (Week 1): Met OT Short Term Goal 2 (Week 1): Patient will complete lower body bathing using AE, prn, with min assist OT Short Term Goal 2 - Progress (Week 1): Met OT Short Term Goal 3 (Week 1): Patient will complete functional transfer, bed <> w/c, with mod assist OT Short Term Goal 3 - Progress (Week 1): Met OT Short Term Goal 4 (Week 1): Patient will complete toileting using LRAD with min assist for thoroughness OT Short Term Goal 4 - Progress (Week 1): Met  Week 2:  OT Short Term Goal 1 (Week 2): Patient will maintain supported dynamic standing balance during ADL with supervision for safety OT Short Term Goal 2 (Week 2): Pateint will prepare simple meal at w/c level with supervision for safety OT Short Term Goal 3 (Week 2): Patient will demo ability to complete HEP for endurance and UE strength independently OT Short Term Goal 4 (Week 2): Patient will complete bathing and dressing sitting at edge of bed independently  Skilled Therapeutic Interventions/Progress Updates:  Upon entering room, patient found seated in w/c with no complaints of pain. Therapist discussed home set-up with patient and d/c planning. Patient states he will be home alone at times. Therapist and patient problem solved safest way to perform toilet transfers and toielting at home. Patient stated he would keep his BSC beside the bed and transfer off BSC (after toielting) > bed for peri cleansing and clothing management. Patient able to transfer w/c <> drop arm BSC with supervision. At end of session, left patient seated in w/c with RN present and all needed  items within reach.   Precautions:  Precautions Precautions: Fall Precaution Comments: reports falling at home 3-4 times in 6 months. Often falls backwards when using rollator walker particularly on ramp Restrictions Weight Bearing Restrictions: No Other Position/Activity Restrictions: No weight bearing orders, anticipate pt non-weight bearing through amputated extremity will receive clarification.  See FIM for current functional status  Therapy/Group: Individual Therapy  Mahlia Fernando 05/04/2013, 11:00 AM

## 2013-05-04 NOTE — Progress Notes (Signed)
Occupational Therapy Session Note  Patient Details  Name: Leroy [name] MRN: [id number] Date of Birth: [birth year]  Today's Date: 05/04/2013 Time: 0730-0830 60 min  Short Term Goals: Week 2:  OT Short Term Goal 1 (Week 2): Patient will maintain supported dynamic standing balance during ADL with supervision for safety OT Short Term Goal 2 (Week 2): Pateint will prepare simple meal at w/c level with supervision for safety OT Short Term Goal 3 (Week 2): Patient will demo ability to complete HEP for endurance and UE strength independently OT Short Term Goal 4 (Week 2): Patient will complete bathing and dressing sitting at edge of bed independently  Skilled Therapeutic Interventions/Progress Updates: ADL-retraining with focus on performance of bathing dressing, sitting at edge of bed, w/c management, and endurance.   Patient completed pan bath while sitting atedge of bed with good thoroughness using lateral leans to wash buttocks and to don underwear and pants.   Patient the donned prosthesis and completed transfer to w/c to access sink for seated grooming.   Setup assist was provided due to patient's need for rest breaks during session; patient reports that Leroy [name] will provide setup assist with pan bath and clothing each morning, prior to her leaving for work at approx 7 a.m.    Therapy Documentation Precautions:  Precautions Precautions: Fall Precaution Comments: reports falling at home 3-4 times in 6 months. Often falls backwards when using rollator walker particularly on ramp Restrictions Weight Bearing Restrictions: No Other Position/Activity Restrictions: No weight bearing orders, anticipate pt non-weight bearing through amputated extremity will receive clarification.  Vital Signs: Therapy Vitals Temp: 98.2 F (36.8 C) Temp src: Oral Pulse Rate: 84 Resp: 17 BP: 129/81 mmHg Patient Position, if appropriate: Lying Oxygen Therapy SpO2: 94 % O2 Device: None (Room  air)  Pain: Pain Assessment Pain Assessment: 0-10  ADL: ADL ADL Comments:  (see FIM)  See FIM for current functional status  Therapy/Group: Individual Therapy  Second session: Time: [id number] Time Calculation (min):  30 min  Pain Assessment: No pain  Skilled Therapeutic Interventions: ADL-retraining with focus on safe transfers using patient's w/c, home safety, safety awareness, and endurance.   Patient was unable to accurately define home safety needs during this session, to include orientation and placement of furniture.   Patient attempted contact with planned caregiver, Leroy [name] [phone]), who he states will assist with self-care s/p discharge.  Patient related confidence in his ability to complete bed transfer to/from his personal w/c and demonstrated squat-pivot transfers toward right side, 2 times (to/from bed and to facility w/c) independently this session.   OT offered home safety inspection to assess ability to access bedroom, kitchen, and complete transfers and patient agreed to attend session.  At end of treatment session, patient called and left voicemail message with Ms. Leroy [name]  requesting her attendance and/or contact regarding training.  SW notified of patient's decision to remain through 05/07/13 as recommended.  See FIM for current functional status  Therapy/Group: Individual Therapy  Leroy [name] 05/04/2013, 6:56 AM

## 2013-05-04 NOTE — Progress Notes (Signed)
Pts. Blood sugar was >300 at bedtime, asymptomatic P. LovePA was notified with orders made.  4 units novolog SQ was given x1 will cont. to monitor.

## 2013-05-04 NOTE — Progress Notes (Signed)
Subjective/Complaints: Slept well. Pain controlled A 12 point review of systems has been performed and if not noted above is otherwise negative.   Objective: Vital Signs: Blood pressure 129/81, pulse 84, temperature 98.2 F (36.8 C), temperature source Oral, resp. rate 17, weight 129.8 kg (286 lb 2.5 oz), SpO2 94.00%. No results found. No results found for this basename: WBC, HGB, HCT, PLT,  in the last 72 hours No results found for this basename: NA, K, CL, CO, GLUCOSE, BUN, CREATININE, CALCIUM,  in the last 72 hours CBG (last 3)   Recent Labs  05/03/13 1604 05/03/13 2139 05/04/13 0718  GLUCAP 142* 361* 212*    Wt Readings from Last 3 Encounters:  04/28/13 129.8 kg (286 lb 2.5 oz)  04/12/13 129.4 kg (285 lb 4.4 oz)  04/12/13 129.4 kg (285 lb 4.4 oz)    Physical Exam:  Constitutional: He is oriented to person, place, and time. Obese african [place] male in no distress  HENT: dentition poor  Head: Normocephalic.  Eyes: EOM are normal.  Neck: Normal range of motion. Neck supple. No thyromegaly present.  Cardiovascular: Normal rate and regular rhythm. No murmurs or rubs  Respiratory: Effort normal and breath sounds normal except for a few expiratory wheezes. No respiratory distress.  GI: Soft. Bowel sounds are normal. He exhibits no distension.  Neurological: He is alert and oriented to person, place, and time. CN exam normal. Good insight and awareness. UES 5/5 proximal to distal. LLE -HF and KE 4/5. RLE HF and KE 2+ to 3-. No gross sensory deficits.  Skin:  Right BKA site staples intact with minimal drainage on dressing . Left BKA site well healed/ pt wearing prosthesis. Vac in place right groin   Assessment/Plan: 1. Functional deficits secondary to new right BKA, recent left BKA which require 3+ hours per day of interdisciplinary therapy in a comprehensive inpatient rehab setting. Physiatrist is providing close team supervision and 24 hour management of active medical  problems listed below. Physiatrist and rehab team continue to assess barriers to discharge/monitor patient progress toward functional and medical goals. FIM: FIM - Bathing Bathing Steps Patient Completed: Chest;Right Arm;Left Arm;Abdomen;Front perineal area;Buttocks;Right upper leg;Left upper leg;Right lower leg (including foot);Left lower leg (including foot) Bathing: 6: Assistive device (Comment)  FIM - Upper Body Dressing/Undressing Upper body dressing/undressing steps patient completed: Thread/unthread right sleeve of pullover shirt/dresss;Thread/unthread left sleeve of pullover shirt/dress;Put head through opening of pull over shirt/dress;Pull shirt over trunk Upper body dressing/undressing: 7: Complete Independence: No helper FIM - Lower Body Dressing/Undressing Lower body dressing/undressing steps patient completed: Thread/unthread right underwear leg;Thread/unthread left underwear leg;Thread/unthread right pants leg;Thread/unthread left pants leg Lower body dressing/undressing: 4: Min-Patient completed 75 plus % of tasks (while standing, unaware of need for thoroughness to complete)  FIM - Toileting Toileting steps completed by patient: Adjust clothing prior to toileting Toileting: 0: Activity did not occur  FIM - Diplomatic Services operational officer Devices: Grab bars;Prosthesis Toilet Transfers: 0-Activity did not occur  FIM - Banker Devices: Prosthesis;Walker Bed/Chair Transfer: 6: Assistive device: no helper (needed bed raised higher)  FIM - Locomotion: Wheelchair Distance: 115 Locomotion: Wheelchair: 2: Travels 50 - 149 ft with supervision, cueing or coaxing FIM - Locomotion: Ambulation Locomotion: Ambulation Assistive Devices: Walker - Rolling;Prosthesis Ambulation/Gait Assistance: 4: Min assist Locomotion: Ambulation: 1: Travels less than 50 ft with minimal assistance (Pt.>75%)  Comprehension Comprehension Mode:  Auditory Comprehension: 6-Follows complex conversation/direction: With extra time/assistive device  Expression Expression Mode: Verbal Expression: 6-Expresses complex  ideas: With extra time/assistive device  Social Interaction Social Interaction: 6-Interacts appropriately with others with medication or extra time (anti-anxiety, antidepressant).  Problem Solving Problem Solving: 5-Solves basic 90% of the time/requires cueing < 10% of the time  Memory Memory: 6-More than reasonable amt of time  Medical Problem List and Plan:  1. 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 DVT Prophylaxis/Anticoagulation: Xarelto   3. Pain Management: Oxycodone as needed. Monitor with increased mobility   - gabapentin for phantom limb pain  effective 4. Neuropsych: This patient is capable of making decisions on his own behalf.  5. 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 Advanced to follow up for fitted 6. Hypertension. Lisinopril 5 mg daily, Toprol 50 mg twice a day. Monitor with increased mobility  7. Diabetes mellitus with peripheral neuropathy. Hemoglobin A1c 12.4.    - Checking CBGs a.c. and at bedtime.    -increase amaryl to 8mg ---titrate further pending pattern, added glucophage bid,  SSI   -sugars improving- i'm hopeful he can avoid insulin  8. PAF.Xarelto. Cardiac rate controlled. Continue Lopressor  9. Hyperlipidemia. Zetia  10. Poor medical compliance. Provide counseling   LOS (Days) 13 A FACE TO FACE EVALUATION WAS PERFORMED  Solyana Nonaka T 05/04/2013 7:50 AM

## 2013-05-04 NOTE — Progress Notes (Addendum)
[facility] Care Management received referral from patient's Primary Care Provider, Dr Ronnald Ramp. Met with patient at bedside to offer and explain [facility] Care Management services. Explained that he will receive post hospital discharge call and will be evaluated for monthly home visits. Discussed how these services do not interfere or replace home health services. Consents were signed. Left [facility] Care Management packet and contact information at bedside. Appreciative of visit.  Marthenia Rolling, MSN, RN,BSN- [facility] [phone]

## 2013-05-04 NOTE — Patient Care Conference (Addendum)
Inpatient RehabilitationTeam Conference and Plan of Care Update Date: 05/04/2013   Time: 2:10  PM    Patient Name: Leroy [name]      Medical Record Number: [id number]  Date of Birth: 1945-07-24 Sex: Male         Room/Bed: 4W01C/4W01C-01 Payor Info: Payor: [employer] MEDICARE / Plan: [employer] MEDICARE HMO / Product Type: *No Product type* /    Admitting Diagnosis: RT BKA  Admit Date/Time:  04/21/2013  6:41 PM Admission Comments: No comment available   Primary Diagnosis:  <principal problem not specified> Principal Problem: <principal problem not specified>  Patient Active Problem List   Diagnosis Date Noted  . S/P bilateral BKA (below knee amputation) 04/21/2013  . Chronic systolic heart failure 04/15/2013  . Discoloration of skin of foot-Right  04/12/2013  . Atherosclerosis of native arteries of the extremities with gangrene 04/12/2013  . Gangrene of foot 04/12/2013  . 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 Critical lower limb ischemia 03/09/2013  . Foot pain 03/09/2013  . Peripheral vascular disease, unspecified 11/06/2012  . Unilateral complete BKA 09/10/2012  . SVT (supraventricular tachycardia) 09/02/2012  . Onychomycosis of toenail 01/02/2012  . Hyperlipidemia, familial, high LDL 01/02/2012  . Type II or unspecified type diabetes mellitus with neurological manifestations, uncontrolled(250.62) 05/03/2010  . CAD 04/13/2010  . CARDIOMYOPATHY 04/13/2010  . ATRIAL FIBRILLATION, PAROXYSMAL 04/13/2010    Expected Discharge Date: Expected Discharge Date: 05/07/13  Team Members Present: Physician leading conference: Dr. Faith Rogue Social Worker Present: Amada Jupiter, LCSW Nurse Present: Other (comment) Phylliss Bob, RN) PT Present: Karolee Stamps, Jerrye Bushy, PT OT Present: Edwin Cap, OT;Frank Barthold, Loistine Chance, OT PPS Coordinator present : Tora Duck, RN, CRRN     Current Status/Progress Goal Weekly Team Focus  Medical   diabetes control an issue.  inconsistent progress as a whole  better diabetes regulation and understanding  see prior, sugar control   Bowel/Bladder   cont of bowel and bladder  remain cont of bowel and bladder  remain cont of bowel and bladder   Swallow/Nutrition/ Hydration             ADL's   Supervision-Mod I squat-pivot transfers and LB dressing, Min A toileting  Mod I ADL  toilet hygiene, safety awareness, seated bathing and dressing skills, endurance   Mobility   supervision scoot transfers, min assist gait  supervision transfers, modified independent wheelchair propulsion  Safety with transfers, endurance, wheelchair mobility.    Communication             Safety/Cognition/ Behavioral Observations            Pain   10 mg  oxy IR q4h PRN  less than 3  assess for pain q4h   Skin   staples to Rt BKA,  wound to Rt groin   No new skin breakdown or infection while on rehab  assess q shift    Rehab Goals Patient on target to meet rehab goals: No Rehab Goals Revised: recommending additional time to meet goals and complete home eval *See Care Plan and progress notes for long and short-term goals.  Barriers to Discharge: see prior    Possible Resolutions to Barriers:  home eval    Discharge Planning/Teaching Needs:  home with ex-girlfriend to provide intermittent assistance  still need to complete with ex   Team Discussion:  Skin/ wounds improving.  BS still an issue - hope to be able to d/c NOT requiring insulin.  Still @ supervision level with transfers.  Still need to complete education with ex-girlfriend and need to complete home evaluation as pt is poor reporter of info about layout of home and DME he has already.  Recommend extension of stay through end of week.  Revisions to Treatment Plan:  Extension of LOS two days to allow more work on mobility to reach mod i w/c level as well as complete a home evaluation.   Continued Need for Acute Rehabilitation Level of Care: The patient requires daily  medical management by a physician with specialized training in physical medicine and rehabilitation for the following conditions: Daily direction of a multidisciplinary physical rehabilitation program to ensure safe treatment while eliciting the highest outcome that is of practical value to the patient.: Yes Daily medical management of patient stability for increased activity during participation in an intensive rehabilitation regime.: Yes Daily analysis of laboratory values and/or radiology reports with any subsequent need for medication adjustment of medical intervention for : Neurological problems;Post surgical problems;Other  Berk Pilot 05/04/2013, 4:41 PM
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
## 2013-05-04 NOTE — Progress Notes (Signed)
Social Work Patient ID: [name], male   DOB: February 13, 1946, 68 y.o.   MRN: [id number]  Treatment team all involved in discussion with pt about concerns with originally targeted d/c date of tomorrow and recommending that d/c be moved to end of week with plan for home evaluation on Thursday.  Also, hope to allow more progress in therapies with pt able to reach mod i w/c goals.  Pt initially very reluctant to agree to extension, however, is now agreeable.  Have left message for pt's ex-girlfriend, Margot Ables, to try and schedule home eval for 1:00 on Thursday with plans to complete education with here during that same trip.  Continue to follow for d/c planning needs.  Kenedi Cilia, LCSW

## 2013-05-04 NOTE — Discharge Summary (Signed)
Discharge summary job [id number]

## 2013-05-05 ENCOUNTER — Inpatient Hospital Stay (HOSPITAL_COMMUNITY): Payer: Medicare HMO | Admitting: Physical Therapy

## 2013-05-05 ENCOUNTER — Inpatient Hospital Stay (HOSPITAL_COMMUNITY): Payer: Medicare HMO

## 2013-05-05 ENCOUNTER — Inpatient Hospital Stay (HOSPITAL_COMMUNITY): Payer: Medicare Other

## 2013-05-05 LAB — BASIC METABOLIC PANEL
BUN: 12 mg/dL (ref 6–23)
CO2: 24 meq/L (ref 19–32)
CREATININE: 0.99 mg/dL (ref 0.50–1.35)
Calcium: 8.9 mg/dL (ref 8.4–10.5)
Chloride: 102 mEq/L (ref 96–112)
GFR calc Af Amer: >90 mL/min (ref >90)
GFR calc non Af Amer: 83 mL/min — ABNORMAL LOW (ref >90)
GLUCOSE: 234 mg/dL — AB (ref 70–99)
Potassium: 4.2 mEq/L (ref 3.7–5.3)
SODIUM: 139 meq/L (ref 137–147)

## 2013-05-05 LAB — GLUCOSE, CAPILLARY
GLUCOSE-CAPILLARY: 207 mg/dL — AB (ref 70–99)
Glucose-Capillary: 148 mg/dL — ABNORMAL HIGH (ref 70–99)
Glucose-Capillary: 146 mg/dL — ABNORMAL HIGH (ref 70–99)
Glucose-Capillary: 185 mg/dL — ABNORMAL HIGH (ref 70–99)

## 2013-05-05 MED ORDER — LOPERAMIDE HCL 2 MG PO CAPS
2.0000 mg | ORAL_CAPSULE | ORAL | Status: DC | PRN
  Administered 2013-05-05: 2 mg via ORAL
  Filled 2013-05-05: qty 1

## 2013-05-05 NOTE — Progress Notes (Signed)
Physical Therapy Note  Patient Details  Name: BODEE POR MRN: [id number] Date of Birth: 1945/07/07 Today's Date: 05/05/2013  Time: 1120-1153 33 minutes  1:1 No c/o pain, pt states he feels much better now.  Transfer to level surface with supervision, cues for safety with locking w/c brakes.  Gait training with RW 2 x 30' with close supervision for straight path, min A for balance with turns and tight spaces.  Sit to stand training to increase LE strength and assist with transfers x 10 with supervision, cues for hand placement.   Marilyn Wing 05/05/2013, 1:36 PM

## 2013-05-05 NOTE — Discharge Summary (Signed)
NAME:  Leroy [name], Leroy [name]                ACCOUNT NO.:  [id number]  MEDICAL RECORD NO.:  [id number]  LOCATION:  4W01C                        FACILITY:  [facility]  PHYSICIAN:  Mariam Dollaraniel Angiulli, P.A.  DATE OF BIRTH:  Mar 19, 1946  DATE OF ADMISSION:  04/21/2013 DATE OF DISCHARGE:  05/07/2013                              DISCHARGE SUMMARY   DISCHARGE DIAGNOSES: 1. Right below-knee amputation with recent right iliofemoral     endarterectomy, status post right groin dehiscence on 04/14/2013. 2. Xarelto for deep vein thrombosis prophylaxis. 3. Pain management. 4. History of left below-knee amputation on 06/03/2012.  Hypertension,     diabetes mellitus, and peripheral neuropathy, paroxysmal atrial     fibrillation, hyperlipidemia, and poor medical compliance.  HISTORY OF PRESENT ILLNESS:  This is a 68 year old right-handed male, history of PAF, as well as coronary artery disease, peripheral vascular disease with history of DVT, maintained on Xarelto, but recently stopped taking with poor medical compliance, as well as having a left below-knee amputation on 06/03/2012 uses a prosthesis.  The patient did not receive inpatient rehab services secondary insurance issues denial by [facility] at that time.  Admitted on 04/12/2013 with right foot gangrenous changes, as well as recent right iliofemoral endarterectomy with poor healing.  Limb was not felt to be salvageable and underwent right below-knee amputation as well as superficial right groin dehiscence with debridement on 04/14/2013 per Dr. Imogene Burnhen.  Postoperative pain management, as well as wound VAC.  Hemoglobin A1c 12.4 with insulin therapy as directed.  Physical and occupational therapy on going.  The patient was admitted for comprehensive rehab program.  PAST MEDICAL HISTORY:  See discharge diagnoses.  SOCIAL HISTORY:  Lives with family.  FUNCTIONAL HISTORY:  Prior to admission used a prosthesis and walker. Functional status upon admission to  rehab services.  He was unable to bear weight to the left below knee prosthesis to step, hop with a rolling walker.  He was minimal assist for upper body dressing.  PHYSICAL EXAMINATION:  VITAL SIGNS:  Blood pressure 112/71, pulse 83, temperature 99.5, respirations 18. GENERAL:  This was an alert male, in no acute distress. HEENT:  Pupils were round and reactive to light. LUNGS:  Clear to auscultation. CARDIAC:  Rate controlled. ABDOMEN:  Soft, nontender.  Good bowel sounds.  Right below-knee amputation site with staples intact.  REHABILITATION HOSPITAL COURSE:  The patient was admitted to inpatient rehab services with therapies initiated on a 3-hour daily basis consisting of physical therapy, occupational therapy, and 24-hour rehabilitation nursing.  The following issues were addressed during the patient's rehabilitation stay.  Pertaining to Leroy [name], right below-knee amputation on 04/14/2013.  He had also undergone right iliofemoral endarterectomy with right groin dehiscence with a wound VAC in place followed by vascular surgery and dressing changes as advised.  Surgical site healing nicely.  As wound VAC had been removed.  He remained afebrile.  He continued on Xarelto for history of DVT as well as PAF with cardiac rate controlled.  Pain management with the use of oxycodone and Neurontin with good results.  He did have a history of left below- knee amputation on 06/03/2012, and was using a prosthesis.  Blood sugars  had some variables, hemoglobin A1c 12.4, noted poor medical compliance in the past.  His Amaryl had been adjusted.  The patient received weekly collaborative interdisciplinary team conferences to discuss, estimated length of stay, family teaching, and any barriers to discharge.  He was ambulating 30 feet with a rolling walker, minimal assist, needing some minimal verbal cues for safety.  He did receive training and focus on any problem solving and safety awareness that  was needed.  Patient consistently able to rise from supine to sitting at edge of bed unassisted.  Full family teaching was completed and plan was to be discharged to home.  DISCHARGE MEDICATIONS:  Zetia 10 mg p.o. daily, Neurontin 100 mg p.o. t.i.d., Amaryl 4 mg p.o. daily, lisinopril 5 mg p.o. daily, Glucophage 500 mg p.o. b.i.d., Toprol-XL 50 mg p.o. b.i.d., nitroglycerin as needed, chest pain.  Oxycodone immediate release 5-10 mg every 4 hours as needed moderate pain, Protonix 40 mg p.o. daily, Xarelto 20 mg p.o. daily.  DIET:  Diabetic diet.  He was receiving Santyl dressings applied to right groin twice daily as needed.  The patient would follow up with Dr. Faith Rogue at the outpatient rehab service office as directed, Dr. [name] vascular surgery 2 weeks call for appointment.  Dr. [name], medical management appointment to be made.  Ongoing therapies had been arranged.     Mariam Dollar, P.A.     DA/MEDQ  D:  05/04/2013  T:  05/04/2013  Job:  [id number]  cc:   [name], MD Ranelle Oyster, M.D. [name], MD

## 2013-05-05 NOTE — Progress Notes (Signed)
Physical Therapy Note  Patient Details  Name: Leroy [name] MRN: [id number] Date of Birth: March 04, 1946 Today's Date: 05/05/2013  Time: 900 Pt missed 60 minutes skilled PT due to pt refusal due to previous episodes of diarrhea, pt says he is worn out.   DONAWERTH,KAREN 05/05/2013, 9:38 AM

## 2013-05-05 NOTE — Progress Notes (Signed)
Subjective/Complaints: Sleeping well. No new complaints. Sometimes inconsistent with therapy A 12 point review of systems has been performed and if not noted above is otherwise negative.   Objective: Vital Signs: Blood pressure 120/78, pulse 89, temperature 98.4 F (36.9 C), temperature source Oral, resp. rate 16, weight 129.8 kg (286 lb 2.5 oz), SpO2 93.00%. No results found. No results found for this basename: WBC, HGB, HCT, PLT,  in the last 72 hours  Recent Labs  05/05/13 0614  NA 139  K 4.2  CL 102  GLUCOSE 234*  BUN 12  CREATININE 0.99  CALCIUM 8.9   CBG (last 3)   Recent Labs  05/04/13 1702 05/04/13 2102 05/05/13 0723  GLUCAP 140* 142* 207*    Wt Readings from Last 3 Encounters:  04/28/13 129.8 kg (286 lb 2.5 oz)  04/12/13 129.4 kg (285 lb 4.4 oz)  04/12/13 129.4 kg (285 lb 4.4 oz)    Physical Exam:  Constitutional: He is oriented to person, place, and time. Obese african [place] male in no distress  HENT: dentition poor  Head: Normocephalic.  Eyes: EOM are normal.  Neck: Normal range of motion. Neck supple. No thyromegaly present.  Cardiovascular: Normal rate and regular rhythm. No murmurs or rubs  Respiratory: Effort normal and breath sounds normal except for a few expiratory wheezes. No respiratory distress.  GI: Soft. Bowel sounds are normal. He exhibits no distension.  Neurological: He is alert and oriented to person, place, and time. CN exam normal. Good insight and awareness. UES 5/5 proximal to distal. LLE -HF and KE 4/5. RLE HF and KE 2+ to 3-. No gross sensory deficits.  Skin:  Right BKA site staples intact with minimal drainage on dressing . Left BKA site well healed/ pt wearing prosthesis. Vac in place right groin   Assessment/Plan: 1. Functional deficits secondary to new right BKA, recent left BKA which require 3+ hours per day of interdisciplinary therapy in a comprehensive inpatient rehab setting. Physiatrist is providing close team  supervision and 24 hour management of active medical problems listed below. Physiatrist and rehab team continue to assess barriers to discharge/monitor patient progress toward functional and medical goals. FIM: FIM - Bathing Bathing Steps Patient Completed: Chest;Right Arm;Left Arm;Abdomen;Front perineal area;Buttocks;Right upper leg;Left upper leg;Right lower leg (including foot);Left lower leg (including foot) Bathing: 6: More than reasonable amount of time  FIM - Upper Body Dressing/Undressing Upper body dressing/undressing steps patient completed: Thread/unthread right sleeve of pullover shirt/dresss;Thread/unthread left sleeve of pullover shirt/dress;Put head through opening of pull over shirt/dress;Pull shirt over trunk Upper body dressing/undressing: 6: More than reasonable amount of time FIM - Lower Body Dressing/Undressing Lower body dressing/undressing steps patient completed: Thread/unthread right underwear leg;Thread/unthread left underwear leg;Pull underwear up/down;Thread/unthread right pants leg;Thread/unthread left pants leg;Pull pants up/down;Fasten/unfasten pants;Don/Doff left sock;Don/Doff left shoe (L-prosthesis) Lower body dressing/undressing: 5: Supervision: Safety issues/verbal cues  FIM - Toileting Toileting steps completed by patient: Adjust clothing prior to toileting Toileting: 0: Activity did not occur  FIM - Diplomatic Services operational officerToilet Transfers Toilet Transfers Assistive Devices: Bedside commode Toilet Transfers: 5-To toilet/BSC: Supervision (verbal cues/safety issues);5-From toilet/BSC: Supervision (verbal cues/safety issues)  FIM - Press photographerBed/Chair Transfer Bed/Chair Transfer Assistive Devices: Prosthesis Bed/Chair Transfer: 5: Bed > Chair or W/C: Supervision (verbal cues/safety issues);5: Chair or W/C > Bed: Supervision (verbal cues/safety issues)  FIM - Locomotion: Wheelchair Distance: 115 Locomotion: Wheelchair: 5: Travels 150 ft or more: maneuvers on rugs and over door sills with  supervision, cueing or coaxing FIM - Locomotion: Ambulation Locomotion: Ambulation Assistive Devices:  Walker - Rolling;Prosthesis Ambulation/Gait Assistance: 4: Min assist Locomotion: Ambulation: 1: Travels less than 50 ft with minimal assistance (Pt.>75%)  Comprehension Comprehension Mode: Auditory Comprehension: 6-Follows complex conversation/direction: With extra time/assistive device  Expression Expression Mode: Verbal Expression: 6-Expresses complex ideas: With extra time/assistive device  Social Interaction Social Interaction: 6-Interacts appropriately with others with medication or extra time (anti-anxiety, antidepressant).  Problem Solving Problem Solving: 5-Solves basic 90% of the time/requires cueing < 10% of the time  Memory Memory: 6-More than reasonable amt of time  Medical Problem List and Plan:  1. 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 DVT Prophylaxis/Anticoagulation: Xarelto   3. Pain Management: Oxycodone as needed. Monitor with increased mobility   - gabapentin for phantom limb pain  effective 4. Neuropsych: This patient is capable of making decisions on his own behalf.  5. 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 Advanced to follow up for fitted 6. Hypertension. Lisinopril 5 mg daily, Toprol 50 mg twice a day. Monitor with increased mobility  7. Diabetes mellitus with peripheral neuropathy. Hemoglobin A1c 12.4.    - Checking CBGs a.c. and at bedtime.    -increased amaryl to 8mg --, added glucophage bid,  SSI.   -sugars improving slowly  8. PAF.Xarelto. Cardiac rate controlled. Continue Lopressor  9. Hyperlipidemia. Zetia  10. Poor medical compliance. Provide counseling   LOS (Days) 14 A FACE TO FACE EVALUATION WAS PERFORMED  Jaine Estabrooks T 05/05/2013 8:15 AM

## 2013-05-05 NOTE — Progress Notes (Signed)
Occupational Therapy Session Note  Patient Details  Name: Leroy GriffithsWilliam J [name] MRN: [id number] Date of Birth: May 23, 1945  Today's Date: 05/05/2013 Time: 0730-0830 Time Calculation (min): 60 min  Short Term Goals: Week 2:  OT Short Term Goal 1 (Week 2): Patient will maintain supported dynamic standing balance during ADL with supervision for safety OT Short Term Goal 2 (Week 2): Pateint will prepare simple meal at w/c level with supervision for safety OT Short Term Goal 3 (Week 2): Patient will demo ability to complete HEP for endurance and UE strength independently OT Short Term Goal 4 (Week 2): Patient will complete bathing and dressing sitting at edge of bed independently  Skilled Therapeutic Interventions/Progress Updates: ADL-retraining with focus on seated bathing and dressing at edge of bed.   Patient received supine in bed, complaining of fatigue from episodes of diarrhea previous night.   Patient was willing to participate in ADL but required extra time and setup assist to encourage participation.   Patient proceeded through bathing, seated at edge of bed but suspended ADL after bathing to request urgent assist with toileting.  OT provided BSC at edge of bed and patient completed transfer with standby assist.   Patient voided loose BM and requested extra time to empty bowel.   Patient returned to bed after BM and assisted toilet hygiene, seeking rest and meds for nausea at end of session.   RN made aware.  Patient left in bed, call light placed within reach.     Therapy Documentation Precautions:  Precautions Precautions: Fall Precaution Comments: reports falling at home 3-4 times in 6 months. Often falls backwards when using rollator walker particularly on ramp Restrictions Weight Bearing Restrictions: No Other Position/Activity Restrictions: No weight bearing orders, anticipate pt non-weight bearing through amputated extremity will receive clarification. General:   Vital  Signs: Therapy Vitals Temp: 98.4 F (36.9 C) Temp src: Oral Pulse Rate: 89 Resp: 16 BP: 120/78 mmHg Patient Position, if appropriate: Lying Oxygen Therapy SpO2: 93 % O2 Device: None (Room air)  Pain: No report pain  (pt reported nausea, upset stomach, residual diarrhea; RN made aware)   ADL: ADL ADL Comments:  (see FIM)  See FIM for current functional status  Therapy/Group: Individual Therapy  Second session: Time: 1345-1430 Time Calculation (min): 45 min  Pain Assessment: No report of pain  Skilled Therapeutic Interventions: Therapeutic activities with focus on problem-solving, w/c mobility using patient's w/c, re-education on home safety needs, seated grooming, and therapeutic exercises to improve endurance and right residual limb strength using theraband.   Patient received in his room, sitting in recliner, with ex-wife present.  Patient reported mechanical problems with his w/c and requested assist with problem-solving.  OT eventually noted restriction from arm rest support and resolved issue to patient's satisfaction.   From recliner patient transferred to w/c with verbal cues for lock brakes and steadying assist to secure recliner.   Patient was escorted to gym and completed 12 min of UE ergometry using Sci[employer], level 1.8 as well as re-ed on residual limb strengthening using theraband.    From gym patient was escorted back to his room for seated grooming; patient completed shaving independently this session at sink.  See FIM for current functional status  Therapy/Group: Individual Therapy  Shakirra Buehler 05/05/2013, 9:30 AM

## 2013-05-05 NOTE — Progress Notes (Signed)
Inpatient Diabetes Program Recommendations  AACE/ADA: New Consensus Statement on Inpatient Glycemic Control (2013)  Target Ranges:  Prepandial:   less than 140 mg/dL      Peak postprandial:   less than 180 mg/dL (1-2 hours)      Critically ill patients:  140 - 180 mg/dL   Pt needs basal insulin, takes 50 units bid at home. Please, please add at least 20 units daily while here.  Inpatient Diabetes Program Recommendations Insulin - Basal: xxxxxxxxxxx Correction (SSI): xxxx Insulin - Meal Coverage: xxxxxxxxxxxxx  Thank you, Lenor CoffinAnn Felicite Zeimet, RN, CNS, Diabetes Coordinator [phone])

## 2013-05-06 ENCOUNTER — Inpatient Hospital Stay (HOSPITAL_COMMUNITY): Payer: Medicare HMO

## 2013-05-06 ENCOUNTER — Inpatient Hospital Stay (HOSPITAL_COMMUNITY): Payer: Commercial Managed Care - HMO | Admitting: Physical Therapy

## 2013-05-06 DIAGNOSIS — E1165 Type 2 diabetes mellitus with hyperglycemia: Secondary | ICD-10-CM

## 2013-05-06 DIAGNOSIS — I10 Essential (primary) hypertension: Secondary | ICD-10-CM

## 2013-05-06 DIAGNOSIS — IMO0001 Reserved for inherently not codable concepts without codable children: Secondary | ICD-10-CM

## 2013-05-06 DIAGNOSIS — S88119A Complete traumatic amputation at level between knee and ankle, unspecified lower leg, initial encounter: Secondary | ICD-10-CM

## 2013-05-06 DIAGNOSIS — I739 Peripheral vascular disease, unspecified: Secondary | ICD-10-CM

## 2013-05-06 DIAGNOSIS — L98499 Non-pressure chronic ulcer of skin of other sites with unspecified severity: Secondary | ICD-10-CM

## 2013-05-06 DIAGNOSIS — I4891 Unspecified atrial fibrillation: Secondary | ICD-10-CM

## 2013-05-06 LAB — GLUCOSE, CAPILLARY
GLUCOSE-CAPILLARY: 151 mg/dL — AB (ref 70–99)
GLUCOSE-CAPILLARY: 148 mg/dL — AB (ref 70–99)
Glucose-Capillary: 207 mg/dL — ABNORMAL HIGH (ref 70–99)
Glucose-Capillary: 234 mg/dL — ABNORMAL HIGH (ref 70–99)

## 2013-05-06 NOTE — Progress Notes (Signed)
Occupational Therapy Session Note  Patient Details  Name: Leroy [name] MRN: [id number] Date of Birth: [birth year]  Today's Date: 05/06/2013 Time: 0730-0832 Time Calculation (min): 62 min  Short Term Goals: Week 2:  OT Short Term Goal 1 (Week 2): Patient will maintain supported dynamic standing balance during ADL with supervision for safety OT Short Term Goal 2 (Week 2): Pateint will prepare simple meal at w/c level with supervision for safety OT Short Term Goal 3 (Week 2): Patient will demo ability to complete HEP for endurance and UE strength independently OT Short Term Goal 4 (Week 2): Patient will complete bathing and dressing sitting at edge of bed independently  Skilled Therapeutic Interventions/Progress Updates: ADL-retraining at ADL apartment  tub/tub bench with focus on w/c mobility, transfers, management of L-LE prosthesis, and adapted dressing.   Patient completed transfer and shower with setup assist but reiterates plan to complete pan bath at edge of bed or at kitchen sink due to inability access bathroom from w/c or walker.    Patient able to independently remove L-LE prosthesis to transfer legs into tub and progresses effectively while seated on tub bench, incorporating lateral leans to wash buttocks.   Patient completed dressing seated at w/c and maintained dynamic standing balance using grab bar and min assist to pull up shorts over left hip but required min assist to pull shorts over right hip.     Therapy Documentation Precautions:  Precautions Precautions: Fall Precaution Comments: reports falling at home 3-4 times in 6 months. Often falls backwards when using rollator walker particularly on ramp Restrictions Weight Bearing Restrictions: No Other Position/Activity Restrictions: No weight bearing orders, anticipate pt non-weight bearing through amputated extremity will receive clarification.  Vital Signs: Therapy Vitals Temp: 98.2 F (36.8 C) Temp src: Oral Pulse  Rate: 75 Resp: 26 BP: 114/71 mmHg Patient Position, if appropriate: Sitting Oxygen Therapy SpO2: 96 % O2 Device: None (Room air)  Pain: No pain   ADL: ADL ADL Comments:  (see FIM)  See FIM for current functional status  Therapy/Group: Individual Therapy  Second session: Time: [id number] Time Calculation (min):  100 min  Pain Assessment: No pain   Skilled Therapeutic Interventions:    OT Home evaluation Completed.   See Home Visit Checklist contained in shadow chart for details.   See FIM for current functional status  Therapy/Group: Individual Therapy  Leroy [name] 05/06/2013, 3:59 PM

## 2013-05-06 NOTE — Progress Notes (Signed)
Subjective/Complaints: No complaints. Feels as if he's getting stronger. Sugars still up but improving. A 12 point review of systems has been performed and if not noted above is otherwise negative.   Objective: Vital Signs: Blood pressure 117/64, pulse 65, temperature 98.4 F (36.9 C), temperature source Oral, resp. rate 17, weight 129.8 kg (286 lb 2.5 oz), SpO2 97.00%. No results found. No results found for this basename: WBC, HGB, HCT, PLT,  in the last 72 hours  Recent Labs  05/05/13 0614  NA 139  K 4.2  CL 102  GLUCOSE 234*  BUN 12  CREATININE 0.99  CALCIUM 8.9   CBG (last 3)   Recent Labs  05/05/13 1651 05/05/13 2117 05/06/13 0714  GLUCAP 146* 185* 151*    Wt Readings from Last 3 Encounters:  04/28/13 129.8 kg (286 lb 2.5 oz)  04/12/13 129.4 kg (285 lb 4.4 oz)  04/12/13 129.4 kg (285 lb 4.4 oz)    Physical Exam:  Constitutional: He is oriented to person, place, and time. Obese african [place] male in no distress  HENT: dentition poor  Head: Normocephalic.  Eyes: EOM are normal.  Neck: Normal range of motion. Neck supple. No thyromegaly present.  Cardiovascular: Normal rate and regular rhythm. No murmurs or rubs  Respiratory: Effort normal and breath sounds normal except for a few expiratory wheezes. No respiratory distress.  GI: Soft. Bowel sounds are normal. He exhibits no distension.  Neurological: He is alert and oriented to person, place, and time. CN exam normal. Good insight and awareness. UES 5/5 proximal to distal. LLE -HF and KE 4/5. RLE HF and KE 2+ to 3-. No gross sensory deficits.  Skin:  Right BKA site staples intact with minimal drainage on dressing . Left BKA site well healed/ pt wearing prosthesis. Vac in place right groin   Assessment/Plan: 1. Functional deficits secondary to new right BKA, recent left BKA which require 3+ hours per day of interdisciplinary therapy in a comprehensive inpatient rehab setting. Physiatrist is providing  close team supervision and 24 hour management of active medical problems listed below. Physiatrist and rehab team continue to assess barriers to discharge/monitor patient progress toward functional and medical goals. FIM: FIM - Bathing Bathing Steps Patient Completed: Chest;Right Arm;Left Arm;Abdomen;Front perineal area;Buttocks;Right upper leg;Left upper leg;Right lower leg (including foot);Left lower leg (including foot) Bathing: 6: More than reasonable amount of time  FIM - Upper Body Dressing/Undressing Upper body dressing/undressing steps patient completed: Thread/unthread right sleeve of pullover shirt/dresss;Thread/unthread left sleeve of pullover shirt/dress;Put head through opening of pull over shirt/dress;Pull shirt over trunk Upper body dressing/undressing: 6: More than reasonable amount of time FIM - Lower Body Dressing/Undressing Lower body dressing/undressing steps patient completed: Thread/unthread right underwear leg;Thread/unthread left underwear leg;Pull underwear up/down;Thread/unthread right pants leg;Thread/unthread left pants leg;Pull pants up/down;Fasten/unfasten pants;Don/Doff left sock;Don/Doff left shoe (L-prosthesis) Lower body dressing/undressing: 5: Supervision: Safety issues/verbal cues  FIM - Toileting Toileting steps completed by patient: Adjust clothing prior to toileting Toileting: 0: Activity did not occur  FIM - Diplomatic Services operational officer Devices: Bedside commode Toilet Transfers: 5-To toilet/BSC: Supervision (verbal cues/safety issues);5-From toilet/BSC: Supervision (verbal cues/safety issues)  FIM - Press photographer Assistive Devices: Prosthesis Bed/Chair Transfer: 5: Bed > Chair or W/C: Supervision (verbal cues/safety issues);5: Chair or W/C > Bed: Supervision (verbal cues/safety issues)  FIM - Locomotion: Wheelchair Distance: 115 Locomotion: Wheelchair: 5: Travels 150 ft or more: maneuvers on rugs and over door  sills with supervision, cueing or coaxing FIM - Locomotion: Ambulation  Locomotion: Ambulation Assistive Devices: Walker - Rolling;Prosthesis Ambulation/Gait Assistance: 4: Min assist Locomotion: Ambulation: 1: Travels less than 50 ft with minimal assistance (Pt.>75%)  Comprehension Comprehension Mode: Auditory Comprehension: 6-Follows complex conversation/direction: With extra time/assistive device  Expression Expression Mode: Verbal Expression: 6-Expresses complex ideas: With extra time/assistive device  Social Interaction Social Interaction: 6-Interacts appropriately with others with medication or extra time (anti-anxiety, antidepressant).  Problem Solving Problem Solving: 5-Solves complex 90% of the time/cues < 10% of the time  Memory Memory: 6-More than reasonable amt of time  Medical Problem List and Plan:  1. 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 DVT Prophylaxis/Anticoagulation: Xarelto   3. Pain Management: Oxycodone as needed. Monitor with increased mobility   - gabapentin for phantom limb pain  effective 4. Neuropsych: This patient is capable of making decisions on his own behalf.  5. 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 Advanced to follow up for fitted 6. Hypertension. Lisinopril 5 mg daily, Toprol 50 mg twice a day. Monitor with increased mobility  7. Diabetes mellitus with peripheral neuropathy. Hemoglobin A1c 12.4.    - Checking CBGs a.c. and at bedtime.    -increased amaryl to 8mg --, added glucophage bid,  SSI.   -sugars showing continued improvement.   -had a long discussion with patient regarding health/diet choices moving forward  8. PAF.Xarelto. Cardiac rate controlled. Continue Lopressor  9. Hyperlipidemia. Zetia  10. Poor medical compliance. Provide counseling   LOS (Days) 15 A FACE TO FACE EVALUATION WAS  PERFORMED  SWARTZ,ZACHARY T 05/06/2013 8:02 AM

## 2013-05-06 NOTE — Progress Notes (Signed)
Occupational Therapy Note  Patient Details  Name: Leroy [name] MRN: [id number] Date of Birth: [birth year] Today's Date: 05/06/2013  Time: 6301-6010 Pt denies pain Individual Therapy  Pt initially engaged in BUE therex (Random routine; work level 7; for 9 mins and work level 2 for 7 mins) with rest breaks between sets.  Pt rolled to ADL apartment and practiced squat pivot transfers w/c<>bed.  Pt rolled to room and transferred to recliner.  Focus on activity tolerance/endurance, transfers, d/c planning, and safety awareness.   Lavone Neri [facility] 05/06/2013, 12:09 PM

## 2013-05-06 NOTE — Progress Notes (Signed)
Physical Therapy Session Note  Patient Details  Name: Leroy [name] MRN: [id number] Date of Birth: 12-25-1945  Today's Date: 05/06/2013 Time: 1000-1030 Time Calculation (min): 30 min  Skilled Therapeutic Interventions/Progress Updates:    Ramp negotiation with w/c supervision, 2 reps. Supervision for safety. Ambulation with RW x 15' with min-guard assist (weights added to give same "feel" as bariatric walker). W/C to follow. Pt asking about use of one crutch to walk with, therapist demonstrated how unsafe this would be. Reiterated no walking at home unless with therapy.      Therapy Documentation Precautions:  Precautions Precautions: Fall Precaution Comments: reports falling at home 3-4 times in 6 months. Often falls backwards when using rollator walker particularly on ramp Restrictions Weight Bearing Restrictions: No Other Position/Activity Restrictions: No weight bearing orders, anticipate pt non-weight bearing through amputated extremity will receive clarification. Pain: Pain Assessment Pain Assessment: No/denies pain  See FIM for current functional status  Therapy/Group: Individual Therapy  Wilhemina Bonito 05/06/2013, 12:45 PM

## 2013-05-07 ENCOUNTER — Inpatient Hospital Stay (HOSPITAL_COMMUNITY): Payer: Medicare HMO | Admitting: Physical Therapy

## 2013-05-07 LAB — GLUCOSE, CAPILLARY
GLUCOSE-CAPILLARY: 154 mg/dL — AB (ref 70–99)
Glucose-Capillary: 181 mg/dL — ABNORMAL HIGH (ref 70–99)

## 2013-05-07 MED ORDER — METOPROLOL SUCCINATE ER 50 MG PO TB24: 50.0000 mg | ORAL_TABLET | Freq: Two times a day (BID) | ORAL | Status: DC

## 2013-05-07 MED ORDER — GLIMEPIRIDE 4 MG PO TABS: 8.0000 mg | ORAL_TABLET | Freq: Every day | ORAL | Status: DC

## 2013-05-07 MED ORDER — RIVAROXABAN 20 MG PO TABS: 20.0000 mg | ORAL_TABLET | Freq: Every day | ORAL | Status: DC

## 2013-05-07 MED ORDER — PANTOPRAZOLE SODIUM 40 MG PO TBEC: 40.0000 mg | DELAYED_RELEASE_TABLET | Freq: Every day | ORAL | Status: DC

## 2013-05-07 MED ORDER — LISINOPRIL 5 MG PO TABS: 5.0000 mg | ORAL_TABLET | Freq: Every day | ORAL | Status: DC

## 2013-05-07 MED ORDER — NITROGLYCERIN 0.4 MG SL SUBL: 0.4000 mg | SUBLINGUAL_TABLET | SUBLINGUAL | Status: DC | PRN

## 2013-05-07 MED ORDER — EZETIMIBE 10 MG PO TABS: 10.0000 mg | ORAL_TABLET | Freq: Every day | ORAL | Status: DC

## 2013-05-07 MED ORDER — METFORMIN HCL 500 MG PO TABS: 500.0000 mg | ORAL_TABLET | Freq: Two times a day (BID) | ORAL | Status: DC

## 2013-05-07 MED ORDER — OXYCODONE HCL 5 MG PO TABS: 5.0000 mg | ORAL_TABLET | ORAL | Status: DC | PRN

## 2013-05-07 MED ORDER — GABAPENTIN 100 MG PO CAPS: 100.0000 mg | ORAL_CAPSULE | Freq: Three times a day (TID) | ORAL | Status: DC

## 2013-05-07 NOTE — Progress Notes (Signed)
Subjective/Complaints: Ready to go home. Denies pain. Had questions about his diabetes again A 12 point review of systems has been performed and if not noted above is otherwise negative.   Objective: Vital Signs: Blood pressure 119/41, pulse 72, temperature 98.8 F (37.1 C), temperature source Oral, resp. rate 18, weight 129.8 kg (286 lb 2.5 oz), SpO2 95.00%. No results found. No results found for this basename: WBC, HGB, HCT, PLT,  in the last 72 hours  Recent Labs  05/05/13 0614  NA 139  K 4.2  CL 102  GLUCOSE 234*  BUN 12  CREATININE 0.99  CALCIUM 8.9   CBG (last 3)   Recent Labs  05/06/13 1708 05/06/13 2033 05/07/13 0705  GLUCAP 148* 234* 154*    Wt Readings from Last 3 Encounters:  04/28/13 129.8 kg (286 lb 2.5 oz)  04/12/13 129.4 kg (285 lb 4.4 oz)  04/12/13 129.4 kg (285 lb 4.4 oz)    Physical Exam:  Constitutional: He is oriented to person, place, and time. Obese african [place] male in no distress  HENT: dentition poor  Head: Normocephalic.  Eyes: EOM are normal.  Neck: Normal range of motion. Neck supple. No thyromegaly present.  Cardiovascular: Normal rate and regular rhythm. No murmurs or rubs  Respiratory: Effort normal and breath sounds normal except for a few expiratory wheezes. No respiratory distress.  GI: Soft. Bowel sounds are normal. He exhibits no distension.  Neurological: He is alert and oriented to person, place, and time. CN exam normal. Good insight and awareness. UES 5/5 proximal to distal. LLE -HF and KE 4/5. RLE HF and KE 2+ to 3-. No gross sensory deficits.  Skin:  Right BKA site staples intact with minimal drainage on dressing . Left BKA site well healed/ pt wearing prosthesis. Vac in place right groin   Assessment/Plan: 1. Functional deficits secondary to new right BKA, recent left BKA which require 3+ hours per day of interdisciplinary therapy in a comprehensive inpatient rehab setting. Physiatrist is providing close team  supervision and 24 hour management of active medical problems listed below. Physiatrist and rehab team continue to assess barriers to discharge/monitor patient progress toward functional and medical goals.  Home today with HH follow up> no stump shrinker for right leg yet FIM: FIM - Bathing Bathing Steps Patient Completed: Chest;Right Arm;Left Arm;Abdomen;Front perineal area;Buttocks;Right upper leg;Left upper leg Bathing: 6: More than reasonable amount of time  FIM - Upper Body Dressing/Undressing Upper body dressing/undressing steps patient completed: Thread/unthread right sleeve of pullover shirt/dresss;Thread/unthread left sleeve of pullover shirt/dress;Put head through opening of pull over shirt/dress;Pull shirt over trunk Upper body dressing/undressing: 7: Complete Independence: No helper FIM - Lower Body Dressing/Undressing Lower body dressing/undressing steps patient completed: Thread/unthread right underwear leg;Thread/unthread left underwear leg;Pull underwear up/down;Thread/unthread right pants leg;Thread/unthread left pants leg;Pull pants up/down Lower body dressing/undressing: 6: More than reasonable amount of time  FIM - Toileting Toileting steps completed by patient: Adjust clothing prior to toileting;Performs perineal hygiene;Adjust clothing after toileting Toileting: 6: More than reasonable amount of time (using lateral leans, completes hygiene with extra time)  FIM - Diplomatic Services operational officer Devices: Bedside commode Toilet Transfers: 6-To toilet/ BSC;6-From toilet/BSC  FIM - Banker Devices: Arm rests Bed/Chair Transfer: 7: Supine > Sit: No assist;5: Bed > Chair or W/C: Supervision (verbal cues/safety issues);5: Chair or W/C > Bed: Supervision (verbal cues/safety issues)  FIM - Locomotion: Wheelchair Distance: 115 Locomotion: Wheelchair: 5: Travels 150 ft or more: maneuvers on rugs and  over door sills with  supervision, cueing or coaxing FIM - Locomotion: Ambulation Locomotion: Ambulation Assistive Devices: Walker - Rolling;Prosthesis Ambulation/Gait Assistance: 4: Min assist Locomotion: Ambulation: 1: Travels less than 50 ft with minimal assistance (Pt.>75%)  Comprehension Comprehension Mode: Auditory Comprehension: 6-Follows complex conversation/direction: With extra time/assistive device  Expression Expression Mode: Verbal Expression: 6-Expresses complex ideas: With extra time/assistive device  Social Interaction Social Interaction: 7-Interacts appropriately with others - No medications needed.  Problem Solving Problem Solving: 5-Solves complex 90% of the time/cues < 10% of the time  Memory Memory: 6-More than reasonable amt of time  Medical Problem List and Plan:  1. 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 DVT Prophylaxis/Anticoagulation: Xarelto   3. Pain Management: Oxycodone as needed. Monitor with increased mobility   - gabapentin for phantom limb pain  effective 4. Neuropsych: This patient is capable of making decisions on his own behalf.  5. 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 Advanced to follow up for fitted 6. Hypertension. Lisinopril 5 mg daily, Toprol 50 mg twice a day. Monitor with increased mobility  7. Diabetes mellitus with peripheral neuropathy. Hemoglobin A1c 12.4.    - Checking CBGs a.c. and at bedtime.    -increased amaryl to 8mg --, added glucophage bid,  SSI.   -sugars showing continued improvement.--will need further titration as an outpt---needs to see PCP   -had a long discussion with patient regarding health/diet choices moving forward  -renal function normal (glucophage problems in the past?)---recheck with HH next week  8. PAF.Xarelto. Cardiac rate controlled. Continue Lopressor  9.  Hyperlipidemia. Zetia  10. Poor medical compliance. Provide counseling   LOS (Days) 16 A FACE TO FACE EVALUATION WAS PERFORMED  Ryllie Nieland T 05/07/2013 7:57 AM

## 2013-05-07 NOTE — Progress Notes (Signed)
Physical Therapy Discharge Summary  Patient Details  Name: Leroy [name] MRN: [id number] Date of Birth: 01/11/1946  Today's Date: 05/07/2013 Time: 0930-1030 Time Calculation (min): 60 min  Patient has met 10 of 10 long term goals due to improved activity tolerance, improved balance, improved postural control, increased strength, decreased pain, ability to compensate for deficits and improved awareness.  Pt progressed nicely while on inpatient rehab starting at +2 total assist for transfers to  Supervision. Patient to discharge at a wheelchair level Modified Independent for propulsion, supervision for transfers. Pt advised not to stand or perform gait unless with HHPT.   Patient's care partner is independent to provide the necessary physical assistance at discharge.  Pt mod I with w/c propulsion in controlled in home environment, increased time for propulsion. Pt able to perform scoot transfers at supervision level, able to manage w/c set-up without verbal cues. Pt advised to have someone present for transfers for safety - pt reports he will likely not follow PT's advice. Car transfer performed with supervision. Ramp negotiation with supervision, discussed steep incline of home ramp - advised pt to have second person with him until he has performed ramp multiple times without difficulty. Ambulation x 32' with regular weight RW and weights applied to RW for increased stability, close supervision. Pt aware he is to only ambulate with HHPT at this point due to elevated risk for falls. Reviewed limb wrapping, handout provided. Reviewed HEP, pt has had handout and reports independence with this already due to familiarity from prior amputation. Pt has no further questions - excited to go home.   Reasons goals not met: NA  Recommendation:  Patient will benefit from ongoing skilled PT services in home health setting to continue to advance safe functional mobility, address ongoing impairments in dynamic  standing balance, safety with transfers (to reach mod I level), safety in the home, and minimize fall risk.  Equipment: w/c, w/c cushion requested. Pt provided with unit w/c as his insurance reports it will take 10 days to get w/c to pt's home. Pt's current w/c is broken and he has been sitting on a wooden board.   Reasons for discharge: treatment goals met and discharge from hospital  Patient/family agrees with progress made and goals achieved: Yes  PT Discharge Precautions/Restrictions Precautions Precautions: Fall Restrictions Weight Bearing Restrictions: No Pain  no c/o   Cognition Overall Cognitive Status: Within Functional Limits for tasks assessed Arousal/Alertness: Awake/alert Orientation Level: Oriented X4 Attention: Divided Selective Attention: Appears intact Alternating Attention: Appears intact Alternating Attention Impairment: Functional basic;Verbal complex Divided Attention: Appears intact Memory: Appears intact Awareness: Impaired Awareness Impairment: Anticipatory impairment Problem Solving: Impaired Problem Solving Impairment: Functional complex Executive Function: Decision Making Decision Making: Impaired Decision Making Impairment: Functional complex Self Correcting: Appears intact Safety/Judgment: Impaired Comments: Attempts unsafe transfers and mobility up/down steep grades despite cues to seek assistance Sensation Sensation Light Touch: Appears Intact (with exception of distal Rt LE, Not tested due to bandage) Proprioception: Appears Intact Coordination Gross Motor Movements are Fluid and Coordinated: Yes Fine Motor Movements are Fluid and Coordinated: Yes Motor  Motor Motor: Within Functional Limits  Mobility Bed Mobility Rolling Right: 6: Modified independent (Device/Increase time) Right Sidelying to Sit: 6: Modified independent (Device/Increase time) Transfers Transfers: Yes Lateral/Scoot Transfers: 5: Supervision Lateral/Scoot Transfer  Details (indicate cue type and reason): Supervision for safety, cues for going towards prosthetic side for difficult transfers Locomotion  Ambulation Ambulation: Yes Ambulation/Gait Assistance: 5: Supervision Ambulation Distance (Feet): 32 Feet Assistive device: Rolling  walker Ambulation/Gait Assistance Details: w/c close to follow, supervision for safety.  Gait Gait: Yes Gait Pattern: Impaired Gait Pattern: Trunk flexed ("hop" gait) Stairs / Additional Locomotion Ramp: 5: Building services engineer Mobility: Yes Wheelchair Assistance: 6: Modified independent (Device/Increase time) Environmental health practitioner: Both upper extremities Wheelchair Parts Management: Independent  Trunk/Postural Assessment  Cervical Assessment Cervical Assessment: Within Functional Limits Thoracic Assessment Thoracic Assessment: Within Functional Limits Lumbar Assessment Lumbar Assessment: Within Functional Limits Postural Control Postural Control: Within Functional Limits Righting Reactions: Decreased in standing  Balance Balance Balance Assessed: Yes Static Sitting Balance Static Sitting - Balance Support: Right upper extremity supported;Left upper extremity supported;Feet supported;Feet unsupported Static Sitting - Level of Assistance: 6: Modified independent (Device/Increase time) Dynamic Sitting Balance Dynamic Sitting - Level of Assistance: 6: Modified independent (Device/Increase time) Dynamic Sitting - Balance Activities: Reaching for objects Extremity Assessment      RLE Assessment RLE Assessment: Exceptions to [facility] RLE AROM (degrees) RLE Overall AROM Comments: Decreased knee extension, limited by ~10 degrees extension RLE Strength RLE Overall Strength Comments: Grossly 3/5 residual musculature LLE Assessment LLE Assessment: Exceptions to [facility] LLE Strength LLE Overall Strength Comments: Generalized deconditioning, grossly >/= 4-/5 for residual musculature  See FIM for  current functional status  Lahoma Rocker 05/07/2013, 12:43 PM

## 2013-05-07 NOTE — Progress Notes (Signed)
Occupational Therapy Discharge Summary  Patient Details  Name: LENZY KERSCHNER MRN: [id number] Date of Birth: [birth year]  Today's Date: 05/07/2013  Patient has met 6 of 6 long term goals due to improved activity tolerance, improved balance and ability to compensate for deficits.  Patient to discharge at overall Modified Independent level for BADL.  Patient's care partner is independent to provide necessary assistance with iADL at discharge.    Reasons goals not met: n/a  Recommendation:  Patient will not require ongoing skilled OT services to continue to advance functional skills in the area of BADL.  Equipment: No equipment provided  Reasons for discharge: treatment goals met  Patient/family agrees with progress made and goals achieved: Yes  OT Discharge Precautions/Restrictions  Precautions Precautions: Fall Restrictions Weight Bearing Restrictions: No  Vital Signs Therapy Vitals Temp: 98.8 F (37.1 C) Temp src: Oral Pulse Rate: 72 Resp: 18 BP: 119/41 mmHg Patient Position, if appropriate: Lying Oxygen Therapy SpO2: 95 % O2 Device: None (Room air)  Pain Pain Assessment Pain Assessment: No/denies pain  ADL ADL ADL Comments: see FIM  Vision/Perception  Vision - History Baseline Vision: Other (comment) Visual History: Cataracts;Other (comment) Patient Visual Report: No change from baseline Vision - Assessment Eye Alignment: Within Functional Limits Perception Perception: Within Functional Limits Praxis Praxis: Intact   Cognition Overall Cognitive Status: Within Functional Limits for tasks assessed Arousal/Alertness: Awake/alert Orientation Level: Oriented X4 Attention: Divided Selective Attention: Appears intact Alternating Attention: Appears intact Divided Attention: Appears intact Memory: Appears intact Awareness: Impaired Awareness Impairment: Anticipatory impairment Problem Solving: Impaired Problem Solving Impairment: Functional  complex Executive Function: Decision Making Decision Making: Impaired Decision Making Impairment: Functional complex Self Correcting: Appears intact Safety/Judgment: Impaired Comments: Attempts unsafe transfers and mobility up/down steep grades despite cues to seek assistance  Sensation Sensation Light Touch: Appears Intact Stereognosis: Appears Intact Hot/Cold: Appears Intact Proprioception: Appears Intact Coordination Gross Motor Movements are Fluid and Coordinated: Yes Fine Motor Movements are Fluid and Coordinated: Yes  Motor  Motor Motor: Within Functional Limits  Mobility  Bed Mobility Supine to Sit: 7: Independent   Trunk/Postural Assessment  Cervical Assessment Cervical Assessment: Within Functional Limits Thoracic Assessment Thoracic Assessment: Within Functional Limits Lumbar Assessment Lumbar Assessment: Within Functional Limits Postural Control Postural Control: Within Functional Limits   Balance Dynamic Sitting Balance Dynamic Sitting - Level of Assistance: 6: Modified independent (Device/Increase time) Dynamic Sitting - Balance Activities: Reaching for objects  Extremity/Trunk Assessment RUE Assessment RUE Assessment: Within Functional Limits LUE Assessment LUE Assessment: Within Functional Limits  See FIM for current functional status  Pattiann Solanki 05/07/2013, 8:02 AM

## 2013-05-07 NOTE — Progress Notes (Signed)
Social Work  Discharge Note  The overall goal for the admission was met for:   Discharge location: Yes - home with ex-gf to provide assistance  Length of Stay: No - extended by two days to allow time to reach mod i goals and perform home eval - LOS = 16 days  Discharge activity level: Yes - modified independent wheelchair overall  Home/community participation: Yes  Services provided included: MD, RD, PT, OT, RN, TR, Pharmacy and SW  Financial Services: Medicare (**[facility])  Follow-up services arranged: Home Health: RN, PT, OT via [place], DME: 20x16 w/c and cushion REPLACEMENT ordered via [employer].  Anticipate delivery to pt's home next week.  Pt allowed to d/c home today with a w/c and cushion belonging to [facility] with plan that this staff member will pick up from his home when DME has arrived from [place]. and Patient/Family has no preference for HH/DME agencies  Comments (or additional information):  Patient/Family verbalized understanding of follow-up arrangements: Yes  Individual responsible for coordination of the follow-up plan: patient  Confirmed correct DME delivered:  **Please see above note    Starleen Trussell

## 2013-05-14 ENCOUNTER — Encounter: Payer: Self-pay | Admitting: Internal Medicine

## 2013-05-14 ENCOUNTER — Ambulatory Visit (INDEPENDENT_AMBULATORY_CARE_PROVIDER_SITE_OTHER): Payer: Medicare HMO | Admitting: Internal Medicine

## 2013-05-14 VITALS — BP 108/64 | HR 80 | Temp 98.2°F | Resp 16 | Ht 69.0 in

## 2013-05-14 DIAGNOSIS — S31109A Unspecified open wound of abdominal wall, unspecified quadrant without penetration into peritoneal cavity, initial encounter: Secondary | ICD-10-CM

## 2013-05-14 DIAGNOSIS — I4891 Unspecified atrial fibrillation: Secondary | ICD-10-CM

## 2013-05-14 DIAGNOSIS — E1149 Type 2 diabetes mellitus with other diabetic neurological complication: Secondary | ICD-10-CM

## 2013-05-14 LAB — GLUCOSE, POCT (MANUAL RESULT ENTRY): POC Glucose: 95 mg/dl (ref 70–99)

## 2013-05-14 NOTE — Progress Notes (Signed)
Subjective:    Patient ID: Leroy GriffithsWilliam J Gloeckner, male    DOB: 27-Jun-1945, 68 y.o.   MRN: [id number]  Diabetes He presents for his follow-up diabetic visit. He has type 2 diabetes mellitus. There are no hypoglycemic associated symptoms. Pertinent negatives for diabetes include no blurred vision, no chest pain, no fatigue, no polydipsia, no polyphagia, no polyuria, no weakness and no weight loss. Symptoms are stable. Diabetic complications include heart disease, peripheral neuropathy and PVD. Current diabetic treatment includes intensive insulin program and oral agent (dual therapy). He is compliant with treatment all of the time. His weight is stable. He is following a generally healthy diet. Meal planning includes avoidance of concentrated sweets. He never participates in exercise. His home blood glucose trend is decreasing steadily. His breakfast blood glucose range is generally 90-110 mg/dl. His lunch blood glucose range is generally 110-130 mg/dl. His dinner blood glucose range is generally 110-130 mg/dl. His highest blood glucose is 130-140 mg/dl. His overall blood glucose range is 110-130 mg/dl. An ACE inhibitor/angiotensin II receptor blocker is being taken. He does not see a podiatrist.Eye exam is current.      Review of Systems  Constitutional: Negative.  Negative for fever, chills, weight loss, diaphoresis, appetite change and fatigue.  HENT: Negative.   Eyes: Negative.  Negative for blurred vision.  Respiratory: Negative.  Negative for cough, choking, chest tightness, wheezing and stridor.   Cardiovascular: Negative.  Negative for chest pain, palpitations and leg swelling.  Gastrointestinal: Negative.  Negative for nausea, abdominal pain, diarrhea and constipation.  Endocrine: Negative.  Negative for polydipsia, polyphagia and polyuria.  Genitourinary: Negative.   Musculoskeletal: Negative.  Negative for arthralgias, back pain, myalgias, neck pain and neck stiffness.  Skin: Negative.     Allergic/Immunologic: Negative.   Neurological: Negative.  Negative for weakness.  Hematological: Negative.  Negative for adenopathy. Does not bruise/bleed easily.  Psychiatric/Behavioral: Negative.        Objective:   Physical Exam  Vitals reviewed. Constitutional: He is oriented to person, place, and time. He appears well-developed and well-nourished.  Non-toxic appearance. He has a sickly appearance. He appears ill. No distress.  Wheel chair bound bilat  LE amputee LLE prosthesis  HENT:  Head: Normocephalic and atraumatic.  Mouth/Throat: Oropharynx is clear and moist. No oropharyngeal exudate.  Eyes: Conjunctivae are normal. Right eye exhibits no discharge. Left eye exhibits no discharge. No scleral icterus.  Neck: Normal range of motion. Neck supple. No JVD present. No tracheal deviation present. No thyromegaly present.  Cardiovascular: Normal rate, regular rhythm, normal heart sounds and intact distal pulses.  Exam reveals no gallop and no friction rub.   No murmur heard. Pulmonary/Chest: Effort normal and breath sounds normal. No stridor. No respiratory distress. He has no wheezes. He has no rales. He exhibits no tenderness.  Abdominal: Soft. Normal appearance and bowel sounds are normal. He exhibits no distension and no mass. There is no hepatosplenomegaly, splenomegaly or hepatomegaly. There is no tenderness. There is no rebound, no guarding and no CVA tenderness.    Musculoskeletal: Normal range of motion. He exhibits no edema and no tenderness.  Lymphadenopathy:    He has no cervical adenopathy.  Neurological: He is oriented to person, place, and time.  Skin: Skin is warm and dry. No rash noted. He is not diaphoretic. No erythema. No pallor.  Psychiatric: He has a normal mood and affect. His behavior is normal. Judgment and thought content normal.     Lab Results  Component Value Date  WBC 11.4* 04/22/2013   HGB 11.6* 04/22/2013   HCT 35.1* 04/22/2013   PLT 387  04/22/2013   GLUCOSE 234* 05/05/2013   CHOL 256* 01/06/2013   TRIG 189.0* 01/06/2013   HDL 42.20 01/06/2013   LDLDIRECT 206.8 01/06/2013   LDLCALC  Value: 182        Total Cholesterol/HDL:CHD Risk Coronary Heart Disease Risk Table                     Men   Women  1/2 Average Risk   3.4   3.3  Average Risk       5.0   4.4  2 X Average Risk   9.6   7.1  3 X Average Risk  23.4   11.0        Use the calculated Patient Ratio above and the CHD Risk Table to determine the patient's CHD Risk.        ATP III CLASSIFICATION (LDL):  <100     mg/dL   Optimal  295-747  mg/dL   Near or Above                    Optimal  130-159  mg/dL   Borderline  340-370  mg/dL   High  >964     mg/dL   Very High* 38/38/1840   ALT 36 04/22/2013   AST 30 04/22/2013   NA 139 05/05/2013   K 4.2 05/05/2013   CL 102 05/05/2013   CREATININE 0.99 05/05/2013   BUN 12 05/05/2013   CO2 24 05/05/2013   TSH 0.40 01/06/2013   INR 1.17 04/12/2013   HGBA1C 12.4* 03/25/2013       Assessment & Plan:

## 2013-05-14 NOTE — Progress Notes (Signed)
Pre visit review using our clinic review tool, if applicable. No additional management support is needed unless otherwise documented below in the visit note. 

## 2013-05-14 NOTE — Patient Instructions (Signed)
Type 2 Diabetes Mellitus, Adult Type 2 diabetes mellitus, often simply referred to as type 2 diabetes, is a long-lasting (chronic) disease. In type 2 diabetes, the pancreas does not make enough insulin (a hormone), the cells are less responsive to the insulin that is made (insulin resistance), or both. Normally, insulin moves sugars from food into the tissue cells. The tissue cells use the sugars for energy. The lack of insulin or the lack of normal response to insulin causes excess sugars to build up in the blood instead of going into the tissue cells. As a result, high blood sugar (hyperglycemia) develops. The effect of high sugar (glucose) levels can cause many complications. Type 2 diabetes was also previously called adult-onset diabetes but it can occur at any age.  RISK FACTORS  A person is predisposed to developing type 2 diabetes if someone in the family has the disease and also has one or more of the following primary risk factors:  Overweight.  An inactive lifestyle.  A history of consistently eating high-calorie foods. Maintaining a normal weight and regular physical activity can reduce the chance of developing type 2 diabetes. SYMPTOMS  A person with type 2 diabetes may not show symptoms initially. The symptoms of type 2 diabetes appear slowly. The symptoms include:  Increased thirst (polydipsia).  Increased urination (polyuria).  Increased urination during the night (nocturia).  Weight loss. This weight loss may be rapid.  Frequent, recurring infections.  Tiredness (fatigue).  Weakness.  Vision changes, such as blurred vision.  Fruity smell to your breath.  Abdominal pain.  Nausea or vomiting.  Cuts or bruises which are slow to heal.  Tingling or numbness in the hands or feet. DIAGNOSIS Type 2 diabetes is frequently not diagnosed until complications of diabetes are present. Type 2 diabetes is diagnosed when symptoms or complications are present and when blood  glucose levels are increased. Your blood glucose level may be checked by one or more of the following blood tests:  A fasting blood glucose test. You will not be allowed to eat for at least 8 hours before a blood sample is taken.  A random blood glucose test. Your blood glucose is checked at any time of the day regardless of when you ate.  A hemoglobin A1c blood glucose test. A hemoglobin A1c test provides information about blood glucose control over the previous 3 months.  An oral glucose tolerance test (OGTT). Your blood glucose is measured after you have not eaten (fasted) for 2 hours and then after you drink a glucose-containing beverage. TREATMENT   You may need to take insulin or diabetes medicine daily to keep blood glucose levels in the desired range.  You will need to match insulin dosing with exercise and healthy food choices. The treatment goal is to maintain the before meal blood sugar (preprandial glucose) level at 70 130 mg/dL. HOME CARE INSTRUCTIONS   Have your hemoglobin A1c level checked twice a year.  Perform daily blood glucose monitoring as directed by your caregiver.  Monitor urine ketones when you are ill and as directed by your caregiver.  Take your diabetes medicine or insulin as directed by your caregiver to maintain your blood glucose levels in the desired range.  Never run out of diabetes medicine or insulin. It is needed every day.  Adjust insulin based on your intake of carbohydrates. Carbohydrates can raise blood glucose levels but need to be included in your diet. Carbohydrates provide vitamins, minerals, and fiber which are an essential part of   a healthy diet. Carbohydrates are found in fruits, vegetables, whole grains, dairy products, legumes, and foods containing added sugars.    Eat healthy foods. Alternate 3 meals with 3 snacks.  Lose weight if overweight.  Carry a medical alert card or wear your medical alert jewelry.  Carry a 15 gram  carbohydrate snack with you at all times to treat low blood glucose (hypoglycemia). Some examples of 15 gram carbohydrate snacks include:  Glucose tablets, 3 or 4   Glucose gel, 15 gram tube  Raisins, 2 tablespoons (24 grams)  Jelly beans, 6  Animal crackers, 8  Regular pop, 4 ounces (120 mL)  Gummy treats, 9  Recognize hypoglycemia. Hypoglycemia occurs with blood glucose levels of 70 mg/dL and below. The risk for hypoglycemia increases when fasting or skipping meals, during or after intense exercise, and during sleep. Hypoglycemia symptoms can include:  Tremors or shakes.  Decreased ability to concentrate.  Sweating.  Increased heart rate.  Headache.  Dry mouth.  Hunger.  Irritability.  Anxiety.  Restless sleep.  Altered speech or coordination.  Confusion.  Treat hypoglycemia promptly. If you are alert and able to safely swallow, follow the 15:15 rule:  Take 15 20 grams of rapid-acting glucose or carbohydrate. Rapid-acting options include glucose gel, glucose tablets, or 4 ounces (120 mL) of fruit juice, regular soda, or low fat milk.  Check your blood glucose level 15 minutes after taking the glucose.  Take 15 20 grams more of glucose if the repeat blood glucose level is still 70 mg/dL or below.  Eat a meal or snack within 1 hour once blood glucose levels return to normal.    Be alert to polyuria and polydipsia which are early signs of hyperglycemia. An early awareness of hyperglycemia allows for prompt treatment. Treat hyperglycemia as directed by your caregiver.  Engage in at least 150 minutes of moderate-intensity physical activity a week, spread over at least 3 days of the week or as directed by your caregiver. In addition, you should engage in resistance exercise at least 2 times a week or as directed by your caregiver.  Adjust your medicine and food intake as needed if you start a new exercise or sport.  Follow your sick day plan at any time you  are unable to eat or drink as usual.  Avoid tobacco use.  Limit alcohol intake to no more than 1 drink per day for nonpregnant women and 2 drinks per day for men. You should drink alcohol only when you are also eating food. Talk with your caregiver whether alcohol is safe for you. Tell your caregiver if you drink alcohol several times a week.  Follow up with your caregiver regularly.  Schedule an eye exam soon after the diagnosis of type 2 diabetes and then annually.  Perform daily skin and foot care. Examine your skin and feet daily for cuts, bruises, redness, nail problems, bleeding, blisters, or sores. A foot exam by a caregiver should be done annually.  Brush your teeth and gums at least twice a day and floss at least once a day. Follow up with your dentist regularly.  Share your diabetes management plan with your workplace or school.  Stay up-to-date with immunizations.  Learn to manage stress.  Obtain ongoing diabetes education and support as needed.  Participate in, or seek rehabilitation as needed to maintain or improve independence and quality of life. Request a physical or occupational therapy referral if you are having foot or hand numbness or difficulties with grooming,   dressing, eating, or physical activity. SEEK MEDICAL CARE IF:   You are unable to eat food or drink fluids for more than 6 hours.  You have nausea and vomiting for more than 6 hours.  Your blood glucose level is over 240 mg/dL.  There is a change in mental status.  You develop an additional serious illness.  You have diarrhea for more than 6 hours.  You have been sick or have had a fever for a couple of days and are not getting better.  You have pain during any physical activity.  SEEK IMMEDIATE MEDICAL CARE IF:  You have difficulty breathing.  You have moderate to large ketone levels. MAKE SURE YOU:  Understand these instructions.  Will watch your condition.  Will get help right away if  you are not doing well or get worse. Document Released: 03/25/2005 Document Revised: 12/18/2011 Document Reviewed: 10/22/2011 ExitCare Patient Information 2014 [employer].  

## 2013-05-15 IMAGING — CT CT ANGIO CHEST
1 of 3 series · 19 of 32 positions shown · IV contrast (APPLIED)
Comparison: None.

CLINICAL DATA: Tachycardia, short of breath, history of blood clot

CT ANGIOGRAPHY CHEST
TECHNIQUE: Multidetector CT imaging of the chest using the
standard protocol during bolus administration of intravenous
contrast. Multiplanar reconstructed images including MIPs were
obtained and reviewed to evaluate the vascular anatomy.
Contrast: 80mL OMNIPAQUE IOHEXOL 350 MG/ML SOLN

[Series 7: pulm embolism 1.0 b25f st · axial · 0.77mm/px · z∈[-328,-46]mm · 19 of 315 slices shown]
[im 17/315  lung]
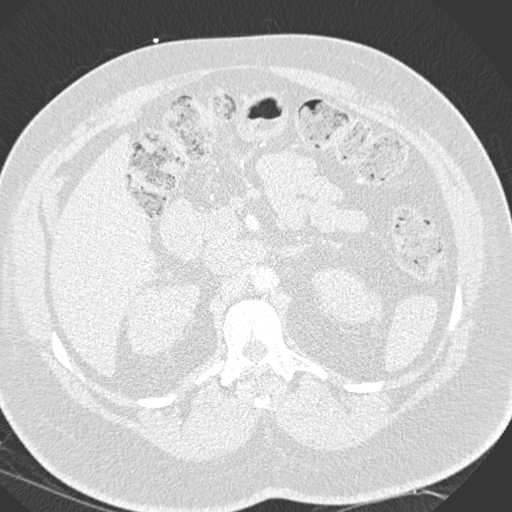
[im 34/315  mediastinal]
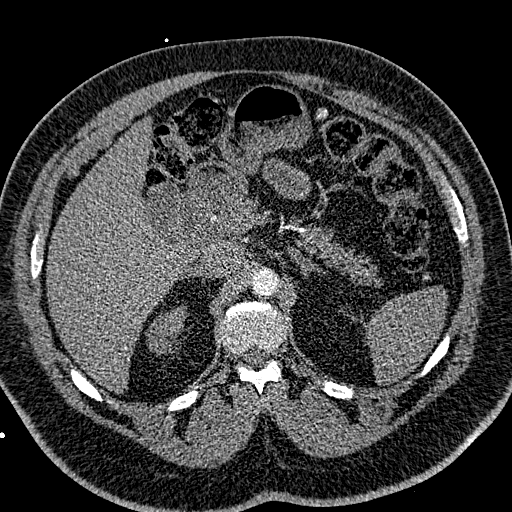
[im 50/315  lung]
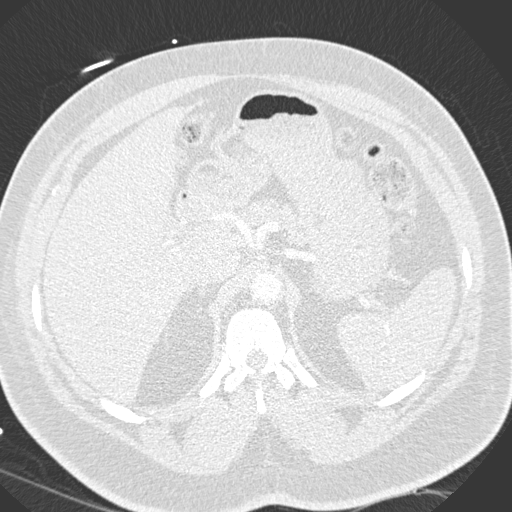
[im 67/315  mediastinal]
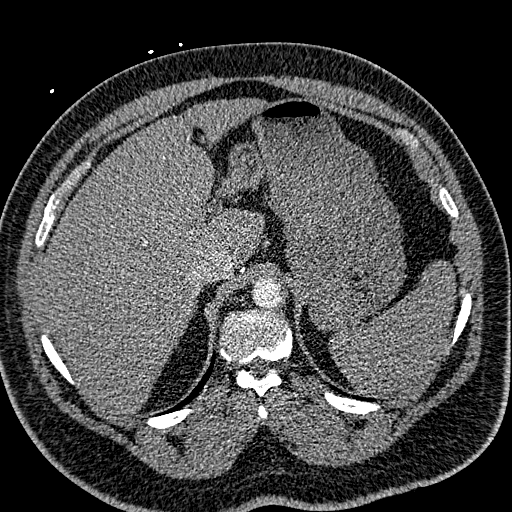
[im 83/315  lung]
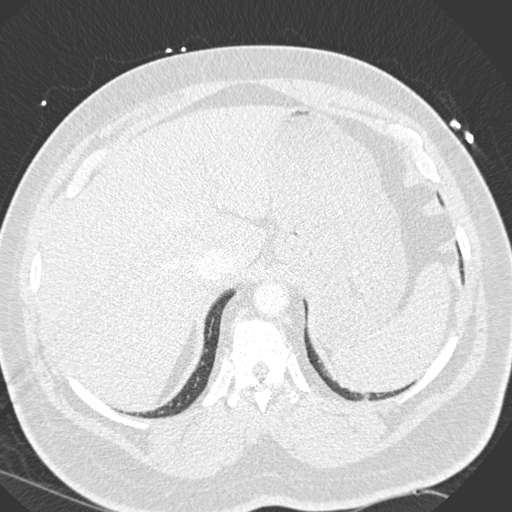
[im 105/315  mediastinal]
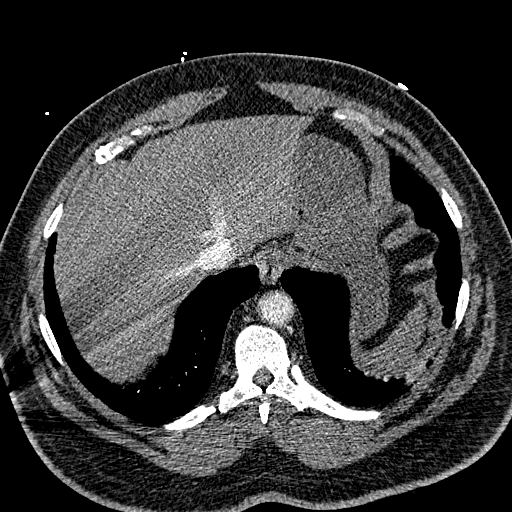
[im 116/315  lung]
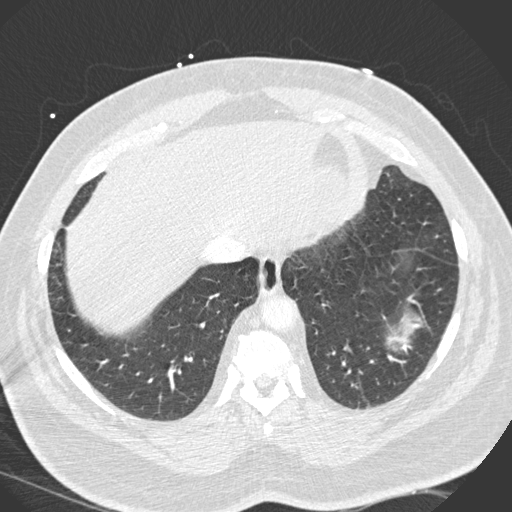
[im 133/315  mediastinal]
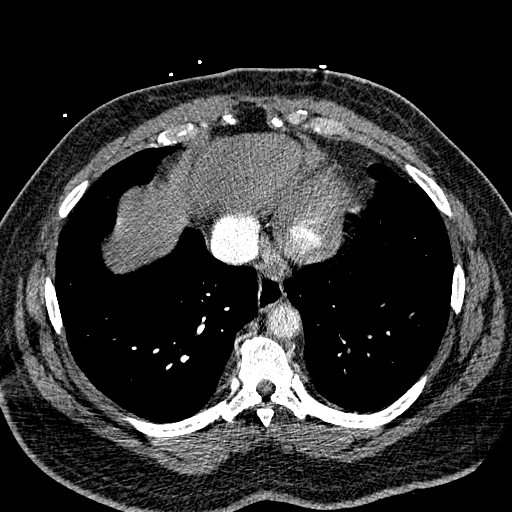
[im 148/315  lung]
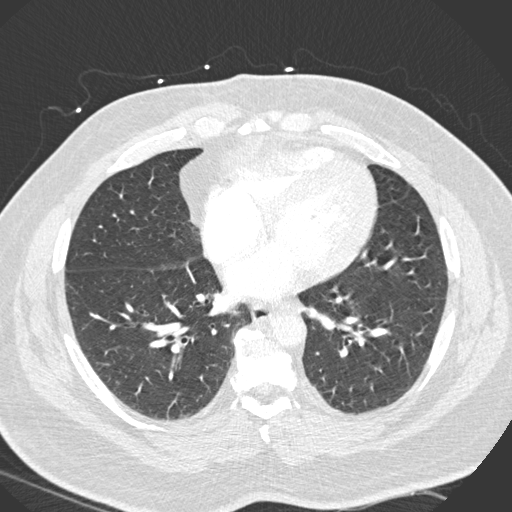
[im 149/315  mediastinal]
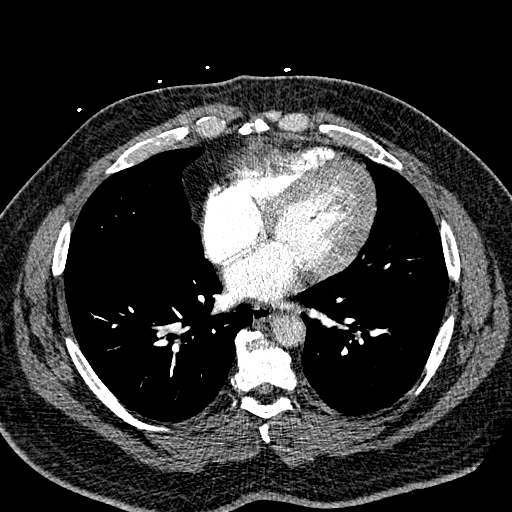
[im 166/315  lung]
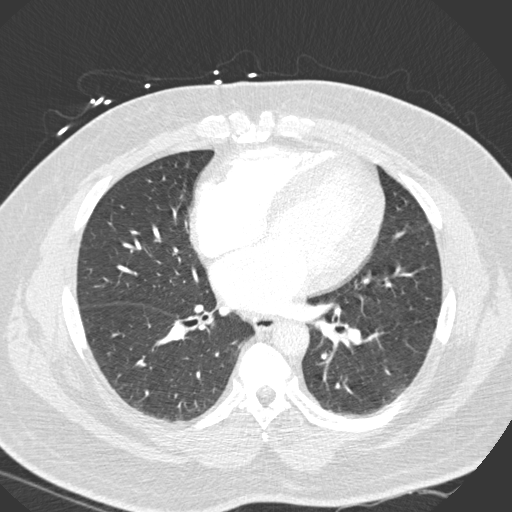
[im 182/315  mediastinal]
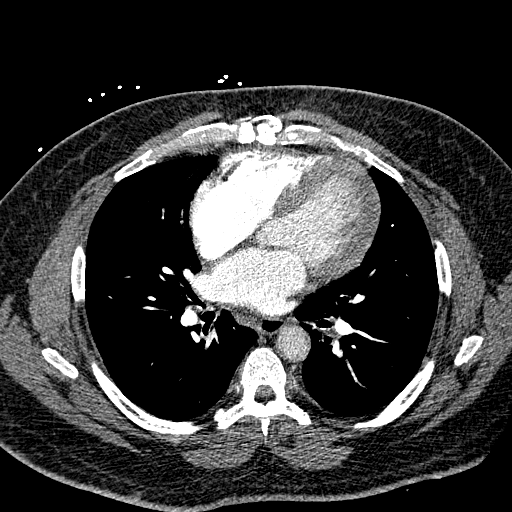
[im 199/315  lung]
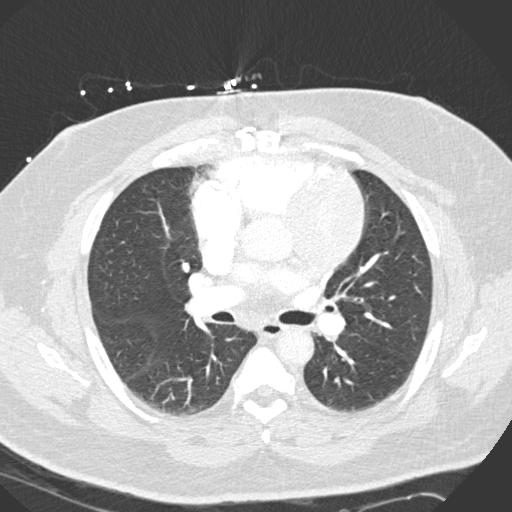
[im 210/315  mediastinal]
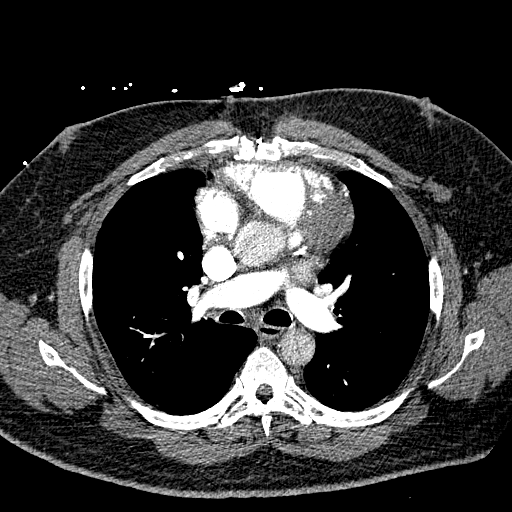
[im 232/315  lung]
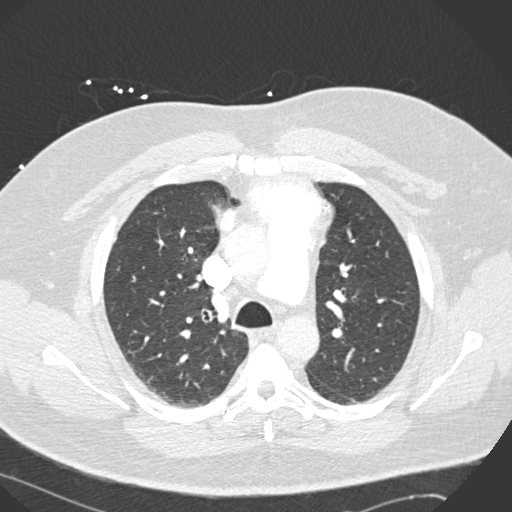
[im 248/315  mediastinal]
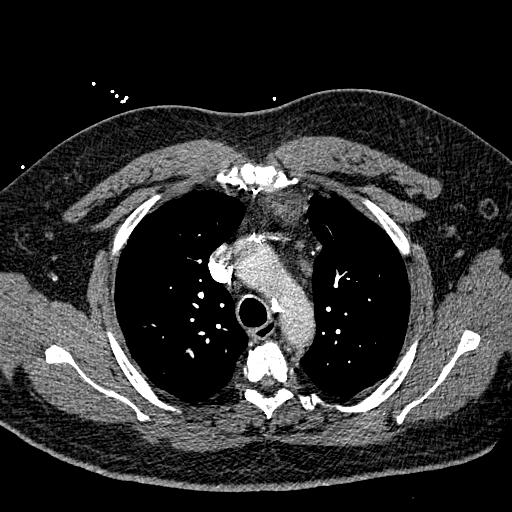
[im 265/315  lung]
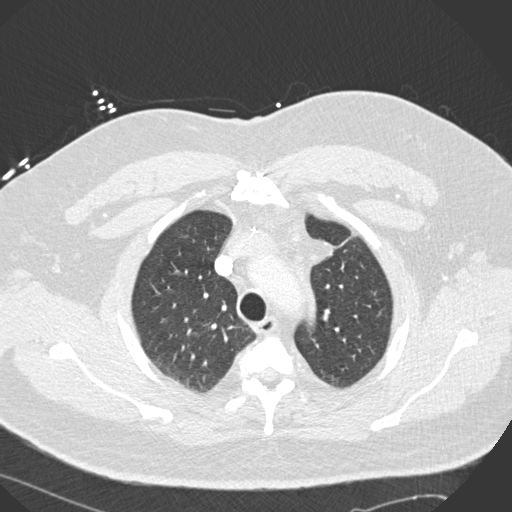
[im 281/315  mediastinal]
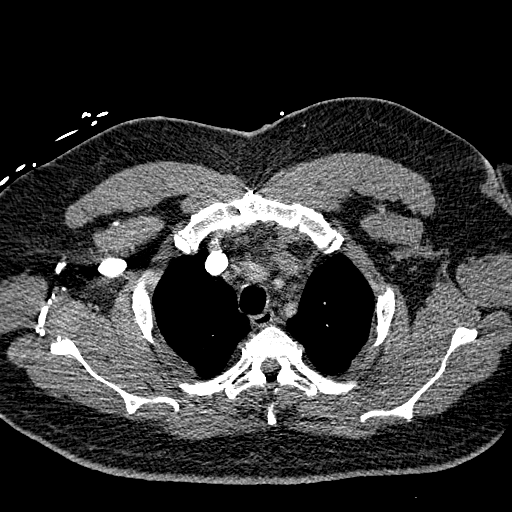
[im 298/315  lung]
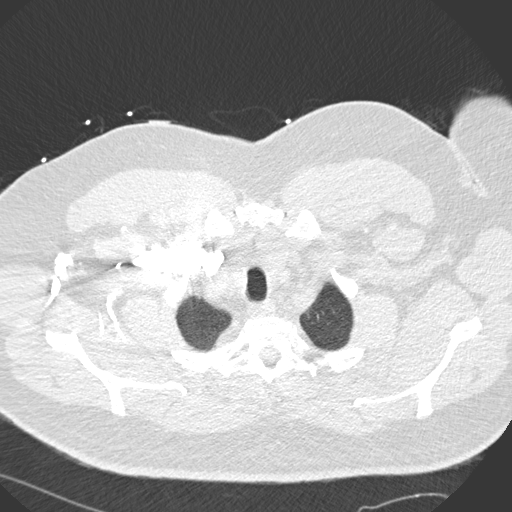

[19 of 32 positions shown; findings below may reference images not displayed]

FINDINGS: There are no filling defects within the pulmonary
arteries to suggest acute pulmonary embolism. Within the right
lower lobe pulmonary artery, there is a small linear wall adherent
filling defect (image 137 through 140, series 7) which likely
represents synechiae of a recannulated remote pulmonary embolism.

No acute findings of the aorta great vessels.  No pericardial
fluid.  Esophagus is normal.  The subcarinal lymph node measuring
11 mm and is borderline by size criteria for pathology.

Review of the lung parenchyma demonstrates no pneumothorax or
pleural fluid.  No pulmonary edema.

Limited view of the upper abdomen is unremarkable.

Limited view of the skeleton demonstrates midline sternotomy.
IMPRESSION: 1..  No evidence acute pulmonary embolism.
2.  Small linear filling defect within the right lower lobe
pulmonary likely represents a synechiae of recannulized remote
pulmonary embolism.

## 2013-05-16 ENCOUNTER — Encounter: Payer: Self-pay | Admitting: Internal Medicine

## 2013-05-16 DIAGNOSIS — S31109A Unspecified open wound of abdominal wall, unspecified quadrant without penetration into peritoneal cavity, initial encounter: Secondary | ICD-10-CM | POA: Insufficient documentation

## 2013-05-16 NOTE — Assessment & Plan Note (Signed)
[facility] is providing wet to dry dressings and the wound appears to be healing There is no evidence of infection today

## 2013-05-16 NOTE — Assessment & Plan Note (Signed)
He has good rate and rhythm control today His volume status is stable as well

## 2013-05-16 NOTE — Assessment & Plan Note (Signed)
There has been some improvement on his blood sugars Will cont the current regimen for now

## 2013-05-19 ENCOUNTER — Telehealth: Payer: Self-pay

## 2013-05-19 NOTE — Telephone Encounter (Signed)
Relevant patient education mailed to patient.  

## 2013-05-20 ENCOUNTER — Encounter: Payer: Self-pay | Admitting: Vascular Surgery

## 2013-05-21 ENCOUNTER — Ambulatory Visit (INDEPENDENT_AMBULATORY_CARE_PROVIDER_SITE_OTHER): Payer: Self-pay | Admitting: Vascular Surgery

## 2013-05-21 ENCOUNTER — Encounter: Payer: Self-pay | Admitting: Vascular Surgery

## 2013-05-21 VITALS — BP 122/51 | HR 87 | Temp 98.1°F | Ht 69.0 in | Wt 286.0 lb

## 2013-05-21 DIAGNOSIS — E1149 Type 2 diabetes mellitus with other diabetic neurological complication: Secondary | ICD-10-CM | POA: Diagnosis not present

## 2013-05-21 DIAGNOSIS — I999 Unspecified disorder of circulatory system: Secondary | ICD-10-CM

## 2013-05-21 DIAGNOSIS — I70229 Atherosclerosis of native arteries of extremities with rest pain, unspecified extremity: Secondary | ICD-10-CM

## 2013-05-21 DIAGNOSIS — S88119A Complete traumatic amputation at level between knee and ankle, unspecified lower leg, initial encounter: Secondary | ICD-10-CM | POA: Diagnosis not present

## 2013-05-21 DIAGNOSIS — Z89511 Acquired absence of right leg below knee: Secondary | ICD-10-CM

## 2013-05-21 DIAGNOSIS — Z89512 Acquired absence of left leg below knee: Secondary | ICD-10-CM

## 2013-05-21 DIAGNOSIS — I998 Other disorder of circulatory system: Secondary | ICD-10-CM

## 2013-05-21 DIAGNOSIS — Z4889 Encounter for other specified surgical aftercare: Secondary | ICD-10-CM | POA: Diagnosis not present

## 2013-05-21 DIAGNOSIS — G909 Disorder of the autonomic nervous system, unspecified: Secondary | ICD-10-CM | POA: Diagnosis not present

## 2013-05-21 NOTE — Progress Notes (Signed)
    Postoperative Visit   History of Present Illness  Leroy [name] is a 68 y.o. male who presents for postoperative follow-up for: R below-the-knee amputation, R groin debridement (Date: 04/14/13).  The patient's wounds are nearly healed.  The patient notes pain is well controlled.  The patient's current symptoms are: none.  For VQI Use Only  PRE-ADM LIVING: Home  AMB STATUS: Wheelchair  Physical Examination  Filed Vitals:   05/21/13 0947  BP: 122/51  Pulse: 87  Temp: 98.1 F (36.7 C)   RLE: R groin: good granulation with good contracture of incision, no smell, no drainage; R BKA incision is healed.  Staples are intact.  Medical Decision Making  ALEXANDRU SARDUY is a 68 y.o. male who presents s/p right below-the-knee ampuation, s/p prior L BKA  The patient's R BKA stump is healing appropriately with resolution of pre-operative symptoms. I discussed in depth with the patient the nature of atherosclerosis, and emphasized the importance of maximal medical management including strict control of blood pressure, blood glucose, and lipid levels, obtaining regular exercise, and cessation of smoking.  The patient is aware that without maximal medical management the underlying atherosclerotic disease process will progress, possibly leading to a more proximal amputation. The patient agrees to participate in their maximal medical care.  Thank you for allowing [place] to participate in this patient's care.  The patient has been referred for prosthetic fitting.  The patient can follow up with [place] as needed.  [name], MD [facility] Office: [phone] Pager: [phone]  05/21/2013, 10:07 AM

## 2013-05-24 ENCOUNTER — Encounter: Payer: Self-pay | Admitting: Family

## 2013-05-24 ENCOUNTER — Ambulatory Visit (INDEPENDENT_AMBULATORY_CARE_PROVIDER_SITE_OTHER): Payer: Self-pay | Admitting: Family

## 2013-05-24 VITALS — BP 130/70 | HR 87 | Temp 98.9°F | Resp 16 | Ht 69.0 in | Wt 302.0 lb

## 2013-05-24 DIAGNOSIS — Z5189 Encounter for other specified aftercare: Secondary | ICD-10-CM | POA: Insufficient documentation

## 2013-05-24 NOTE — Patient Instructions (Signed)
Peripheral Vascular Disease Peripheral Vascular Disease (PVD), also called Peripheral Arterial Disease (PAD), is a circulation problem caused by cholesterol (atherosclerotic plaque) deposits in the arteries. PVD commonly occurs in the lower extremities (legs) but it can occur in other areas of the body, such as your arms. The cholesterol buildup in the arteries reduces blood flow which can cause pain and other serious problems. The presence of PVD can place a person at risk for Coronary Artery Disease (CAD).  CAUSES  Causes of PVD can be many. It is usually associated with more than one risk factor such as:   High Cholesterol.  Smoking.  Diabetes.  Lack of exercise or inactivity.  High blood pressure (hypertension).  Obesity.  Family history. SYMPTOMS   When the lower extremities are affected, patients with PVD may experience:  Leg pain with exertion or physical activity. This is called INTERMITTENT CLAUDICATION. This may present as cramping or numbness with physical activity. The location of the pain is associated with the level of blockage. For example, blockage at the abdominal level (distal abdominal aorta) may result in buttock or hip pain. Lower leg arterial blockage may result in calf pain.  As PVD becomes more severe, pain can develop with less physical activity.  In people with severe PVD, leg pain may occur at rest.  Other PVD signs and symptoms:  Leg numbness or weakness.  Coldness in the affected leg or foot, especially when compared to the other leg.  A change in leg color.  Patients with significant PVD are more prone to ulcers or sores on toes, feet or legs. These may take longer to heal or may reoccur. The ulcers or sores can become infected.  If signs and symptoms of PVD are ignored, gangrene may occur. This can result in the loss of toes or loss of an entire limb.  Not all leg pain is related to PVD. Other medical conditions can cause leg pain such  as:  Blood clots (embolism) or Deep Vein Thrombosis.  Inflammation of the blood vessels (vasculitis).  Spinal stenosis. DIAGNOSIS  Diagnosis of PVD can involve several different types of tests. These can include:  Pulse Volume Recording Method (PVR). This test is simple, painless and does not involve the use of X-rays. PVR involves measuring and comparing the blood pressure in the arms and legs. An ABI (Ankle-Brachial Index) is calculated. The normal ratio of blood pressures is 1. As this number becomes smaller, it indicates more severe disease.  < 0.95  indicates significant narrowing in one or more leg vessels.  <0.8 there will usually be pain in the foot, leg or buttock with exercise.  <0.4 will usually have pain in the legs at rest.  <0.25  usually indicates limb threatening PVD.  Doppler detection of pulses in the legs. This test is painless and checks to see if you have a pulses in your legs/feet.  A dye or contrast material (a substance that highlights the blood vessels so they show up on x-ray) may be given to help your caregiver better see the arteries for the following tests. The dye is eliminated from your body by the kidney's. Your caregiver may order blood work to check your kidney function and other laboratory values before the following tests are performed:  Magnetic Resonance Angiography (MRA). An MRA is a picture study of the blood vessels and arteries. The MRA machine uses a large magnet to produce images of the blood vessels.  Computed Tomography Angiography (CTA). A CTA is a   specialized x-ray that looks at how the blood flows in your blood vessels. An IV may be inserted into your arm so contrast dye can be injected.  Angiogram. Is a procedure that uses x-rays to look at your blood vessels. This procedure is minimally invasive, meaning a small incision (cut) is made in your groin. A small tube (catheter) is then inserted into the artery of your groin. The catheter is  guided to the blood vessel or artery your caregiver wants to examine. Contrast dye is injected into the catheter. X-rays are then taken of the blood vessel or artery. After the images are obtained, the catheter is taken out. TREATMENT  Treatment of PVD involves many interventions which may include:  Lifestyle changes:  Quitting smoking.  Exercise.  Following a low fat, low cholesterol diet.  Control of diabetes.  Foot care is very important to the PVD patient. Good foot care can help prevent infection.  Medication:  Cholesterol-lowering medicine.  Blood pressure medicine.  Anti-platelet drugs.  Certain medicines may reduce symptoms of Intermittent Claudication.  Interventional/Surgical options:  Angioplasty. An Angioplasty is a procedure that inflates a balloon in the blocked artery. This opens the blocked artery to improve blood flow.  Stent Implant. A wire mesh tube (stent) is placed in the artery. The stent expands and stays in place, allowing the artery to remain open.  Peripheral Bypass Surgery. This is a surgical procedure that reroutes the blood around a blocked artery to help improve blood flow. This type of procedure may be performed if Angioplasty or stent implants are not an option. SEEK IMMEDIATE MEDICAL CARE IF:   You develop pain or numbness in your arms or legs.  Your arm or leg turns cold, becomes blue in color.  You develop redness, warmth, swelling and pain in your arms or legs. MAKE SURE YOU:   Understand these instructions.  Will watch your condition.  Will get help right away if you are not doing well or get worse. Document Released: 05/02/2004 Document Revised: 06/17/2011 Document Reviewed: 03/29/2008 Exit[employer] Patient Information 2014 [employer].   Amputation Many new amputations occur each year. The most common causes of amputation of the lower extremity (the hip down) are:  Disease.  Injury caused in an accidents or wars  (trauma).  Birth defects.  Lumps (tumors) that are cancer. Upper extremity amputation is usually the result of trauma or birth defect, with disease being a less common cause. COMMON PROBLEMS After an amputation a number of issues need to be considered. Getting around and self-care are early problems that must be dealt with. A complete rehabilitation program will help the amputee recover mobility. A team approach of caregivers helps the most. Caregivers that can provide a well rounded program include:   Physicians.  Therapists.  Nurses.  Social workers.  Psychologists. Usually there are problems with body image and coping with lifestyle changes. A grieving period similar to dealing with a death in the family is common after an amputation. Talking to a trained professional with experience in treating people with similar problems can be very helpful. When returning to a previous lifestyle, questions about sexuality can arise. Many of these uncertainties are normal. These can be discussed with your psychologist or rehabilitation specialist. REHABILITATION AND RETURN TO WORK AND ACTIVITIES Returning to recreational activities and employment are part of recovery. Many times, changes to recreation equipment can allow return to a sport or hobby. A device that substitutes the missing part of the body is called a   prosthetic. Many prosthetic manufacturers produce components designed for sports. Be sure to discuss all of your leisure interests with your prosthetist. This is the person who helps provide you with custom made replacement limbs. Your physician will also help to select a prosthetic that will meet your needs. Employers will vary in their willingness to change a work environment in order to help people with disabilities. Your therapists can perform job site evaluations. Your therapist can then make recommendations to help with your work area. Some amputees will not be able to return to previous  jobs. Your local Office of Vocational Rehabilitation can assist you in job retraining.  Once you are past the initial rehabilitation stage you will have ongoing contact with caregivers and a prosthetist. You need to work closely with them in making decisions about your prosthetic device. PROGNOSIS  Amputation should not end your joy of life. There are people with limb loss in nearly all walks of life. They are in a wide variety of professions. They participate in nearly all sports. Ask your caregivers about support groups and sports organizations in your area. They can help you with referral to organizations that will be helpful to you. Document Released: 12/15/2001 Document Revised: 06/17/2011 Document Reviewed: 02/09/2007 [employer] Patient Information 2014 [employer].  

## 2013-05-24 NOTE — Progress Notes (Signed)
[facility] [place] HISTORY AND PHYSICAL -PAD  History of Present Illness Leroy [name] is a 68 y.o. male patient of Dr. Imogene Burn who returns today for mild incision dehiscence from right BKA (04/14/2013) site which started yesterday after he took the dressing off as advised last Friday in the office when his staples were removed. He is at home, Home Health nursing visits every Wednesday for dressing change and wound evaluation. He denies fever or chills, denies purulent type drainage.   He also underwent a right iliofemoral endarterectomy with bovine patch angioplasty on 03/25/2013 by Dr. Imogene Burn.  He returns today for lower half of right foot worsening pain and worsening gangrene of right great toe.  He states pain in right foot with walking but denies pain in right calf or thigh with walking, denies pain in left thigh with walking, denies problem with left BKA stump, uses his prosthesis.  He is diabetic with labile glycemic control as he reports, he does not smoke, he does not have CRF.  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  Pt meds include:  Statin :No, documented as due to myalgias  Other anticoagulants/antiplatelets: was taking Xaralto samples from his PCP, but when samples ran out he took no more, is not taking any ASA or Plavix, nor coumadin. Dr. Nicky Pugh note from last month indicates that he is not on an anticoagulant of antiplatelet medication likely due to fall risk.   Past Medical History  Diagnosis Date  . Hyperlipidemia   . Ischemic cardiomyopathy     a. EF 40% 2010. b. Echo (2/14):  mild LVH, EF 30-35%, diff HK, Gr 1 DD, MAC, mild MR, mod LAE, PASP 39  . Cataract   . Hypotension   . Paroxysmal atrial fibrillation   . Peripheral vascular disease     a. s/p L BKA 2014.  [name] History of blood clots     in legs--this was in 2011--has been off of Coumadin 47months;takes Xarelto daily  . Peripheral neuropathy   . Joint pain   . H/O hiatal hernia   . Chronic kidney disease      on Lisinopril to protect kidneys d/t being on Metformin per pt  . Myocardial infarction     total of 8 heart attacks;last one in 2011  . Atrial flutter   . H/O medication noncompliance     Due to insurance issues  . Chronic systolic CHF (congestive heart failure)   . Coronary artery disease     a. s/p remote CABG 2001 at [facility] b. Cath 03/2010: for med rx.;  c.  [name] myoview (1/14):  Large Inferior apical MI, very small area of peri-infarct ischemia toward the inferior apical segment. EF 19%.  Med Rx was continued.    . Diabetes mellitus 1979    takes Metformni daily  and Lantus 50units bid    Social History History  Substance Use Topics  . Smoking status: Former Smoker    Types: Cigarettes  . Smokeless tobacco: Never Used     Comment: quit smoking 82yrs ago  . Alcohol Use: No     Comment: occasionally    Family History Family History  Problem Relation Age of Onset  . Heart disease Mother     before age 103  . Diabetes Mother   . Heart attack Mother   . Heart disease Father     Past Surgical History  Procedure Laterality Date  . Revasculariztion  2011/2010    x 2   .  Coronary artery bypass graft  03/2000    quadruple  . Tonsillectomy    . Hernia repair    . Cardiac catheterization  03/19/10  . Abdominal aortagram  04-30-12  . Adenoidectomy    . Amputation Left 06/03/2012    Procedure: AMPUTATION BELOW KNEE;  Surgeon: Fransisco HertzBrian L Chen, MD;  [facility];  Service: Vascular;  Laterality: Left;  . Coronary angioplasty  2002    3 stents  . Endarterectomy femoral Right 03/25/2013    Procedure: ENDARTERECTOMY FEMORAL ;  Surgeon: Fransisco HertzBrian L Chen, MD;  [facility];  Service: Vascular;  Laterality: Right;  . Patch angioplasty Right 03/25/2013    Procedure: PATCH ANGIOPLASTY USING VASCU-GUARD PERIPHERAL VASCULAR PATCH;  Surgeon: Fransisco HertzBrian L Chen, MD;  [facility];  Service: Vascular;  Laterality: Right;  . Amputation Right 04/14/2013    Procedure: AMPUTATION BELOW KNEE ;   Surgeon: Fransisco HertzBrian L Chen, MD;  [facility];  Service: Vascular;  Laterality: Right;  . I&d extremity Right 04/14/2013    Procedure: IRRIGATION AND DEBRIDEMENT OF RIGHT  GROIN & PLACEMENT OF VAC DRESSING;  Surgeon: Fransisco HertzBrian L Chen, MD;  Location: [facility] OR;  Service: Vascular;  Laterality: Right;    Allergies  Allergen Reactions  . Statins     Muscle aches  . Amlodipine Besylate     Muscle aches making difficult to walk  . Cephalexin     REACTION: Hives    Current Outpatient Prescriptions  Medication Sig Dispense Refill  . acetaminophen (TYLENOL) 325 MG tablet Take 2 tablets (650 mg total) by mouth every 6 (six) hours as needed for mild pain or fever.  30 tablet  0  . ezetimibe (ZETIA) 10 MG tablet Take 1 tablet (10 mg total) by mouth daily.  30 tablet  1  . gabapentin (NEURONTIN) 100 MG capsule Take 1 capsule (100 mg total) by mouth 3 (three) times daily.  90 capsule  1  . glimepiride (AMARYL) 4 MG tablet Take 2 tablets (8 mg total) by mouth daily with breakfast.  30 tablet  1  . LANTUS 100 UNIT/ML injection       . lisinopril (PRINIVIL,ZESTRIL) 5 MG tablet Take 1 tablet (5 mg total) by mouth daily.  30 tablet  11  . metFORMIN (GLUCOPHAGE) 500 MG tablet Take 1 tablet (500 mg total) by mouth 2 (two) times daily with a meal.  60 tablet  1  . metoprolol succinate (TOPROL-XL) 50 MG 24 hr tablet Take 1 tablet (50 mg total) by mouth 2 (two) times daily. Take with or immediately following a meal.  60 tablet  0  . nitroGLYCERIN (NITROSTAT) 0.4 MG SL tablet Place 1 tablet (0.4 mg total) under the tongue every 5 (five) minutes as needed for chest pain.  30 tablet  12  . NOVOLOG 100 UNIT/ML injection       . oxyCODONE (OXY IR/ROXICODONE) 5 MG immediate release tablet Take 1-2 tablets (5-10 mg total) by mouth every 4 (four) hours as needed for moderate pain.  60 tablet  0  . oxyCODONE-acetaminophen (PERCOCET/ROXICET) 5-325 MG per tablet 5-325 tablets.      . pantoprazole (PROTONIX) 40 MG tablet Take 1  tablet (40 mg total) by mouth daily.  30 tablet  1  . Rivaroxaban (XARELTO) 20 MG TABS tablet Take 1 tablet (20 mg total) by mouth daily at 6 PM.  30 tablet  1   No current facility-administered medications for this visit.    ROS: See HPI for  pertinent positives and negatives.   Physical Examination  Filed Vitals:   05/24/13 1012  BP: 130/70  Pulse: 87  Temp: 98.9 F (37.2 C)  Resp: 16    General: A&O x 3, WDWN, morbidly obese, in w/c.   VASCULAR EXAM:  Extremities : Right BKA stump with mild to moderate amount of swelling, serous drainage from shallow dehiscence of surgical wound, no purulence, no redness. LE Pulses  LEFT  RIGHT   FEMORAL  palpable  Dressing dry and intact  POPLITEAL  not palpable  not palpable   POSTERIOR TIBIAL  BKA  BKA   DORSALIS PEDIS  ANTERIOR TIBIAL  BKA  BKA  .  Skin: no lesions other than as mentioned under extremities.  Musculoskeletal: no muscle wasting or atrophy. He has left BKA prosthesis in place. Neurologic: A&O X 3; Appropriate Affect ; SENSATION: normal; MOTOR FUNCTION: moving all extremities equally, motor strength 5/5 throughout except for RLE at 4/5. Speech is fluent/normal. CN grossly intact.  ASSESSMENT: Leroy [name] is a 68 y.o. male who is s/p right BKA (04/14/2013), previous to this he underwent a right iliofemoral endarterectomy with bovine patch angioplasty on 03/25/2013, and he also had a left BKA Feb., 2014.  The right BKA stump has mild superficial wound dehiscence. Serous drainage is decreasing. Degree of swelling at the stump is within expected for 6 weeks post op, but patient was advised to elevate his right leg above hip level as much as possible. Will ask Home Health to do daily NS wet-dry dressing changes to help debride wound and encourage wound granulation.  Advised him to work hard at improving his glycemic control to improve his chances of healing; he sates that he is.  PLAN:  I discussed in depth with the patient  the nature of atherosclerosis, and emphasized the importance of maximal medical management including strict control of blood pressure, blood glucose, and lipid levels, obtaining regular exercise, and continued cessation of smoking.  The patient is aware that without maximal medical management the underlying atherosclerotic disease process will progress, limiting the benefit of any interventions. Discussed with Dr. Myra Gianotti.  Based on the patient's vascular studies and examination, pt will return to clinic in 2 weeks to see Dr. Imogene Burn.  The patient was given information about PAD including signs, symptoms, treatment, what symptoms should prompt the patient to seek immediate medical care, and risk reduction measures to take.  Charisse March, RN, MSN, FNP-C [facility] [place] Phone: [phone]  Clinic MD: Myra Gianotti  05/24/2013 10:11 AM

## 2013-05-26 ENCOUNTER — Other Ambulatory Visit: Payer: Self-pay | Admitting: *Deleted

## 2013-05-26 DIAGNOSIS — I739 Peripheral vascular disease, unspecified: Secondary | ICD-10-CM

## 2013-05-26 MED ORDER — COLLAGENASE 250 UNIT/GM EX OINT: 1.0000 "application " | TOPICAL_OINTMENT | Freq: Every day | CUTANEOUS | Status: DC

## 2013-06-02 ENCOUNTER — Telehealth: Payer: Self-pay

## 2013-06-02 NOTE — Telephone Encounter (Signed)
Vanessa @ [facility] called requesting a refill for patient's glimepiride 4 mg tablets. Contacted Erie NoeVanessa to let her know that patient would need to contact his PCP for a refill.

## 2013-06-10 ENCOUNTER — Encounter: Payer: Self-pay | Admitting: Vascular Surgery

## 2013-06-11 ENCOUNTER — Encounter: Payer: Self-pay | Admitting: Vascular Surgery

## 2013-06-11 ENCOUNTER — Ambulatory Visit (INDEPENDENT_AMBULATORY_CARE_PROVIDER_SITE_OTHER): Payer: Self-pay | Admitting: Vascular Surgery

## 2013-06-11 VITALS — BP 144/65 | HR 69 | Ht 69.0 in | Wt 302.0 lb

## 2013-06-11 DIAGNOSIS — I739 Peripheral vascular disease, unspecified: Secondary | ICD-10-CM | POA: Insufficient documentation

## 2013-06-11 DIAGNOSIS — Z89511 Acquired absence of right leg below knee: Secondary | ICD-10-CM

## 2013-06-11 DIAGNOSIS — S31109A Unspecified open wound of abdominal wall, unspecified quadrant without penetration into peritoneal cavity, initial encounter: Secondary | ICD-10-CM

## 2013-06-11 DIAGNOSIS — Z89512 Acquired absence of left leg below knee: Secondary | ICD-10-CM

## 2013-06-11 DIAGNOSIS — S88119A Complete traumatic amputation at level between knee and ankle, unspecified lower leg, initial encounter: Secondary | ICD-10-CM

## 2013-06-11 NOTE — Progress Notes (Signed)
   Postoperative Visit   History of Present Illness  Leroy GriffithsWilliam J [name] is a 68 y.o. male  who presents for postoperative follow-up for: R below-the-knee amputation, R groin debridement (Date: 04/14/13). The patient's wounds are nearly healed. The patient notes pain is well controlled. The patient's current symptoms are: none.   For VQI Use Only  PRE-ADM LIVING: Home   AMB STATUS: Wheelchair   Physical Examination  Filed Vitals:   06/11/13 1406  BP: 144/65  Pulse: 69  Height: 5\' 9"  (1.753 m)  Weight: 302 lb (136.986 kg)  SpO2: 100%   RLE: R groin: nearly healed, re-epithelization in process  R BKA: most of incision healed, 2 cm defect medially, good granulation evident  Medical Decision Making   Leroy GriffithsWilliam J [name] is a 68 y.o. male  who presents s/p right below-the-knee ampuation, s/p prior L BKA  I would apply an antibiotic ointment to the R groin wound and apply a bandage daily. The R BKA wound should be managed with wet-to-dry dressing daily. The patient will follow up in 2 weeks for wound check.  Leonides SakeBrian Casanova Schurman, MD [facility] [place] Office: [phone] Pager: [phone]  06/11/2013, 2:52 PM

## 2013-06-18 ENCOUNTER — Ambulatory Visit: Payer: Medicare HMO | Admitting: Vascular Surgery

## 2013-06-18 ENCOUNTER — Encounter: Payer: Self-pay | Admitting: Physical Medicine & Rehabilitation

## 2013-06-18 ENCOUNTER — Encounter: Payer: Medicare HMO | Attending: Physical Medicine & Rehabilitation | Admitting: Physical Medicine & Rehabilitation

## 2013-06-18 VITALS — BP 106/57 | HR 73 | Resp 14 | Ht 69.0 in | Wt 290.0 lb

## 2013-06-18 DIAGNOSIS — I2510 Atherosclerotic heart disease of native coronary artery without angina pectoris: Secondary | ICD-10-CM | POA: Insufficient documentation

## 2013-06-18 DIAGNOSIS — Z89511 Acquired absence of right leg below knee: Secondary | ICD-10-CM

## 2013-06-18 DIAGNOSIS — S31109A Unspecified open wound of abdominal wall, unspecified quadrant without penetration into peritoneal cavity, initial encounter: Secondary | ICD-10-CM

## 2013-06-18 DIAGNOSIS — S88119A Complete traumatic amputation at level between knee and ankle, unspecified lower leg, initial encounter: Secondary | ICD-10-CM | POA: Insufficient documentation

## 2013-06-18 DIAGNOSIS — E1149 Type 2 diabetes mellitus with other diabetic neurological complication: Secondary | ICD-10-CM | POA: Insufficient documentation

## 2013-06-18 DIAGNOSIS — I2589 Other forms of chronic ischemic heart disease: Secondary | ICD-10-CM | POA: Insufficient documentation

## 2013-06-18 DIAGNOSIS — E1142 Type 2 diabetes mellitus with diabetic polyneuropathy: Secondary | ICD-10-CM | POA: Insufficient documentation

## 2013-06-18 DIAGNOSIS — I739 Peripheral vascular disease, unspecified: Secondary | ICD-10-CM | POA: Insufficient documentation

## 2013-06-18 DIAGNOSIS — Z951 Presence of aortocoronary bypass graft: Secondary | ICD-10-CM | POA: Insufficient documentation

## 2013-06-18 DIAGNOSIS — Z89512 Acquired absence of left leg below knee: Secondary | ICD-10-CM

## 2013-06-18 NOTE — Progress Notes (Signed)
Subjective:    Patient ID: Leroy [name], male    DOB: [birth year], 68 y.o.   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 He has been dc'ed from therapy. He is able to walk short dx around his house with a walker. His left BK prosthesis has been fitting a little better since the socks were added.   His pain is much improved. He is no longer using the oxycodone. He didn't like it because it made him constipated.   His wound apparently dehisced when the staples came out. The wound has begun to close and there is a small area still open centrally. He sees the surgeon next week.    His sugars have been running in the low 100s with the amaryl. Pain Inventory Average Pain 0 Pain Right Now 0 My pain is no pain  In the last 24 hours, has pain interfered with the following? General activity 0 Relation with others 0 Enjoyment of life 0 What TIME of day is your pain at its worst? no pain Sleep (in general) Good  Pain is worse with: no pain Pain improves with: no pain Relief from Meds: na  Mobility use a wheelchair Do you have any goals in this area?  no  Function retired  Neuro/Psych No problems in this area  Prior Studies Any changes since last visit?  no  Physicians involved in your care Any changes since last visit?  no   Family History  Problem Relation Age of Onset  . Heart disease Mother     before age 67  . Diabetes Mother   . Heart attack Mother   . Heart disease Father    History   Social History  . Marital Status: Legally Separated    Spouse Name: N/A    Number of Children: N/A  . Years of Education: N/A   Social History Main Topics  . Smoking status: Former Smoker    Types: Cigarettes  . Smokeless tobacco: Never Used     Comment: quit smoking 72yrs ago  . Alcohol Use: No     Comment: occasionally  . Drug Use: No  . Sexual Activity: Not Currently   Other Topics Concern  . None   Social History Narrative  . None    Past Surgical History  Procedure Laterality Date  . Revasculariztion  2011/2010    x 2   . Coronary artery bypass graft  03/2000    quadruple  . Tonsillectomy    . Hernia repair    . Cardiac catheterization  03/19/10  . Abdominal aortagram  04-30-12  . Adenoidectomy    . Amputation Left 06/03/2012    Procedure: AMPUTATION BELOW KNEE;  Surgeon: Leroy Hertz, MD;  [facility];  Service: Vascular;  Laterality: Left;  . Coronary angioplasty  2002    3 stents  . Endarterectomy femoral Right 03/25/2013    Procedure: ENDARTERECTOMY FEMORAL ;  Surgeon: Leroy Hertz, MD;  Location: [facility] OR;  Service: Vascular;  Laterality: Right;  . Patch angioplasty Right 03/25/2013    Procedure: PATCH ANGIOPLASTY USING VASCU-GUARD PERIPHERAL VASCULAR PATCH;  Surgeon: Leroy Hertz, MD;  Location[facility];  Service: Vascular;  Laterality: Right;  . Amputation Right 04/14/2013    Procedure: AMPUTATION BELOW KNEE ;  Surgeon: Leroy Hertz, MD;  Location: [facility] OR;  Service: Vascular;  Laterality: Right;  . I&d extremity Right 04/14/2013    Procedure: IRRIGATION AND DEBRIDEMENT OF  RIGHT  GROIN & PLACEMENT OF VAC DRESSING;  Surgeon: Leroy HertzBrian L Chen, MD;  [facility];  Service: Vascular;  Laterality: Right;   Past Medical History  Diagnosis Date  . Hyperlipidemia   . Ischemic cardiomyopathy     a. EF 40% 2010. b. Echo (2/14):  mild LVH, EF 30-35%, diff HK, Gr 1 DD, MAC, mild MR, mod LAE, PASP 39  . Cataract   . Hypotension   . Paroxysmal atrial fibrillation   . Peripheral vascular disease     a. s/p L BKA 2014.  [name]. History of blood clots     in legs--this was in 2011--has been off of Coumadin 373months;takes Xarelto daily  . Peripheral neuropathy   . Joint pain   . H/O hiatal hernia   . Chronic kidney disease     on Lisinopril to protect kidneys d/t being on Metformin per pt  . Myocardial infarction     total of 8 heart attacks;last one in 2011  . Atrial flutter   . H/O medication noncompliance     Due to  insurance issues  . Chronic systolic CHF (congestive heart failure)   . Coronary artery disease     a. s/p remote CABG 2001 at [facility] b. Cath 03/2010: for med rx.;  c.  Leroy BirksLexiscan myoview (1/14):  Large Inferior apical MI, very small area of peri-infarct ischemia toward the inferior apical segment. EF 19%.  Med Rx was continued.    . Diabetes mellitus 1979    takes Metformni daily  and Lantus 50units bid   BP 106/57  Pulse 73  Resp 14  Ht 5\' 9"  (1.753 m)  Wt 290 lb (131.543 kg)  BMI 42.81 kg/m2  SpO2 97%  Opioid Risk Score:   Fall Risk Score: Moderate Fall Risk (6-13 points) (pt educated on fall risk, brochure given to pt)    Review of Systems  All other systems reviewed and are negative.       Objective:   Physical Exam  Constitutional: He is oriented to person, place, and time. Obese    HENT: dentition poor  Head: Normocephalic.  Eyes: EOM are normal.  Neck: Normal range of motion. Neck supple. No thyromegaly present.  Cardiovascular: Normal rate and regular rhythm. No murmurs or rubs  Respiratory: Effort normal and breath sounds normal except for a few expiratory wheezes. No respiratory distress.  GI: Soft. Bowel sounds are normal. He exhibits no distension.  Neurological: He is alert and oriented to person, place, and time. CN exam normal. Good insight and awareness. UES 5/5 proximal to distal. Bilateral HF and KE near 5/5.  Skin: central area of incision still open. Wrapped.     Assessment/Plan:  1. Functional deficits secondary to new right BKA, recent left BKA   -surgical follow up next week.  2.  Pain Management: continue with gabapentin for now until he starts ambulating with prosthesis  -no longer using oxycodone  3. 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 [facility] to follow up for fitted  4.. Diabetes mellitus with peripheral neuropathy. Hemoglobin A1c 12.4. ?Improving control 5. Follow up with PCP over the next month for basic medical issues/DM  30 minutes of face  to face patient care time were spent during this visit. All questions were encouraged and answered.

## 2013-06-18 NOTE — Patient Instructions (Signed)
FOLLOW UP WITH SURGERY FOR YOUR WOUND.  CONTACT [facility] REGARDING A STUMP SHRINKER FOR YOUR LEFT LEG  CONTINUE WITH GABAPENTIN FOR NOW   PLEASE CALL ME WITH ANY PROBLEMS OR QUESTIONS [phone]).

## 2013-06-24 ENCOUNTER — Encounter: Payer: Self-pay | Admitting: Vascular Surgery

## 2013-06-25 ENCOUNTER — Ambulatory Visit (INDEPENDENT_AMBULATORY_CARE_PROVIDER_SITE_OTHER): Payer: Self-pay | Admitting: Vascular Surgery

## 2013-06-25 ENCOUNTER — Encounter: Payer: Self-pay | Admitting: Vascular Surgery

## 2013-06-25 VITALS — BP 144/37 | HR 67 | Ht 69.0 in | Wt 290.0 lb

## 2013-06-25 DIAGNOSIS — I7025 Atherosclerosis of native arteries of other extremities with ulceration: Secondary | ICD-10-CM | POA: Insufficient documentation

## 2013-06-25 DIAGNOSIS — L98499 Non-pressure chronic ulcer of skin of other sites with unspecified severity: Principal | ICD-10-CM

## 2013-06-25 DIAGNOSIS — S88119A Complete traumatic amputation at level between knee and ankle, unspecified lower leg, initial encounter: Secondary | ICD-10-CM

## 2013-06-25 DIAGNOSIS — Z89512 Acquired absence of left leg below knee: Secondary | ICD-10-CM

## 2013-06-25 DIAGNOSIS — Z89511 Acquired absence of right leg below knee: Secondary | ICD-10-CM

## 2013-06-25 DIAGNOSIS — I739 Peripheral vascular disease, unspecified: Secondary | ICD-10-CM

## 2013-06-25 NOTE — Progress Notes (Signed)
    Postoperative Visit   History of Present Illness  Leroy [name] is a 68 y.o. male who presents for postoperative follow-up for: R Below-the-knee amputation , R groin I&D , VAC dressing (Date: 04/14/13).  The patient's wounds are nearly healed.  The patient notes pain is well controlled.  The patient's current symptoms are: none.  For VQI Use Only  PRE-ADM LIVING: Home  AMB STATUS: Wheelchair, ambulating with walker  Physical Examination  Filed Vitals:   06/25/13 1441  BP: 144/37  Pulse: 67   RUE: R groin incision is healed.  R lateral corner of BKA has dead skin which wave removed, underlying tissue has good granulation  Medical Decision Making  Leroy [name] is a 68 y.o. male who presents s/p R below-the-knee amputation, s/p R ilifemoral EA with BPA   The patient's stump is healing appropriately with resolution of pre-operative symptoms. I discussed in depth with the patient the nature of atherosclerosis, and emphasized the importance of maximal medical management including strict control of blood pressure, blood glucose, and lipid levels, obtaining regular exercise, and cessation of smoking.  The patient is aware that without maximal medical management the underlying atherosclerotic disease process will progress, possibly leading to a more proximal amputation. The patient agrees to participate in their maximal medical care.  Thank you for allowing [place] to participate in this patient's care.  The patient has been referred for prosthetic fitting.  i would continue with wet-to-dry dressing to lateral amputation stump wound to speed its re-epithelialization.  The patient will follow up in 1 month to re-evaluate the wound.  The patient can follow up with [place] as needed.  [name], MD [facility] [place] Office: [phone] Pager: [phone]  06/25/2013, 3:09 PM

## 2013-06-29 ENCOUNTER — Telehealth: Payer: Self-pay

## 2013-06-29 NOTE — Telephone Encounter (Signed)
Patient called requesting a refill on Glimepiride. Contacted patient to inform him that he would need to contact his PCP for a refill on medication.

## 2013-06-30 ENCOUNTER — Other Ambulatory Visit: Payer: Self-pay

## 2013-06-30 MED ORDER — GLIMEPIRIDE 4 MG PO TABS: 8.0000 mg | ORAL_TABLET | Freq: Every day | ORAL | Status: DC

## 2013-07-05 ENCOUNTER — Telehealth: Payer: Self-pay | Admitting: *Deleted

## 2013-07-05 NOTE — Telephone Encounter (Signed)
Bethann Berkshire (RN from [facility]) is calling on Leroy [name]'s behalf.  Bethann Berkshire states that Leroy [name] fell on 06-28-2013 and right stump incision reopened.  Bethann Berkshire states she saw Leroy [name] on 07-02-2013 and the right stump had reopened and "drainage was present on bandage."  Bethann Berkshire states that Leroy [name] said incisional area was "sore."  Appt. made for Leroy [name] to see Brynda Peon, NP tomorrow (07-06-2013) at 3:00 PM.  Bethann Berkshire confirmed with Leroy [name]  who states he is able to come tomorrow (07-06-2013) for appointment with NP at 3:00 PM.

## 2013-07-06 ENCOUNTER — Ambulatory Visit (INDEPENDENT_AMBULATORY_CARE_PROVIDER_SITE_OTHER): Payer: Self-pay | Admitting: Family

## 2013-07-06 ENCOUNTER — Encounter: Payer: Self-pay | Admitting: Family

## 2013-07-06 VITALS — HR 53 | Temp 98.1°F | Resp 14 | Ht 69.0 in | Wt 292.0 lb

## 2013-07-06 DIAGNOSIS — M79609 Pain in unspecified limb: Secondary | ICD-10-CM

## 2013-07-06 DIAGNOSIS — L24A9 Irritant contact dermatitis due friction or contact with other specified body fluids: Secondary | ICD-10-CM

## 2013-07-06 DIAGNOSIS — T148XXA Other injury of unspecified body region, initial encounter: Secondary | ICD-10-CM

## 2013-07-06 NOTE — Progress Notes (Signed)
[facility] [place] HISTORY AND PHYSICAL -PAD  History of Present Illness Leroy [name] is a 68 y.o. male patient of Dr. Imogene Burn who is s/p I &D of right groin with placement of right groin wound vac on 04/14/2013, right BKA on 04/14/2013,  Right femoral endarterectomy with patch on 03/25/2014, and left BKA on 06/03/2012. He returns today for draining right BKA stump incision after falling, [facility] notified our office. He is getting wet to dry dressing daily by Home Health, Santyl placed on wound. No redness, no purulent drainage, scant amount moist serous drainage from incision dehiscence area about 2 cm x 3 cm; viable pink tissue, shallow wound. He denies pain.  He plans on eventually using a prosthesis for his right stump. He rarely uses a walker, mostly gets around in a wheelchair.  Left LE prosthesis is in place.  Pt Diabetic: Yes, states his DM is in very good control. Pt smoker: former smoker, quit many years ago  Past Medical History  Diagnosis Date  . Hyperlipidemia   . Ischemic cardiomyopathy     a. EF 40% 2010. b. Echo (2/14):  mild LVH, EF 30-35%, diff HK, Gr 1 DD, MAC, mild MR, mod LAE, PASP 39  . Cataract   . Hypotension   . Paroxysmal atrial fibrillation   . Peripheral vascular disease     a. s/p L BKA 2014.  [name] History of blood clots     in legs--this was in 2011--has been off of Coumadin 64months;takes Xarelto daily  . Peripheral neuropathy   . Joint pain   . H/O hiatal hernia   . Chronic kidney disease     on Lisinopril to protect kidneys d/t being on Metformin per pt  . Myocardial infarction     total of 8 heart attacks;last one in 2011  . Atrial flutter   . H/O medication noncompliance     Due to insurance issues  . Chronic systolic CHF (congestive heart failure)   . Coronary artery disease     a. s/p remote CABG 2001 at [facility] b. Cath 03/2010: for med rx.;  c.  [name] myoview (1/14):  Large Inferior apical MI, very small area of  peri-infarct ischemia toward the inferior apical segment. EF 19%.  Med Rx was continued.    . Diabetes mellitus 1979    takes Metformni daily  and Lantus 50units bid    Social History History  Substance Use Topics  . Smoking status: Former Smoker    Types: Cigarettes  . Smokeless tobacco: Never Used     Comment: quit smoking 43yrs ago  . Alcohol Use: No     Comment: occasionally    Family History Family History  Problem Relation Age of Onset  . Heart disease Mother     before age 110  . Diabetes Mother   . Heart attack Mother   . Heart disease Father     Past Surgical History  Procedure Laterality Date  . Revasculariztion  2011/2010    x 2   . Coronary artery bypass graft  03/2000    quadruple  . Tonsillectomy    . Hernia repair    . Cardiac catheterization  03/19/10  . Abdominal aortagram  04-30-12  . Adenoidectomy    . Amputation Left 06/03/2012    Procedure: AMPUTATION BELOW KNEE;  Surgeon: [name], MD;  Location: [facility] OR;  Service: Vascular;  Laterality: Left;  . Coronary angioplasty  2002  3 stents  . Endarterectomy femoral Right 03/25/2013    Procedure: ENDARTERECTOMY FEMORAL ;  Surgeon: Fransisco HertzBrian L Chen, MD;  [facility];  Service: Vascular;  Laterality: Right;  . Patch angioplasty Right 03/25/2013    Procedure: PATCH ANGIOPLASTY USING VASCU-GUARD PERIPHERAL VASCULAR PATCH;  Surgeon: Fransisco HertzBrian L Chen, MD;  [facility];  Service: Vascular;  Laterality: Right;  . Amputation Right 04/14/2013    Procedure: AMPUTATION BELOW KNEE ;  Surgeon: Fransisco HertzBrian L Chen, MD;  [facility];  Service: Vascular;  Laterality: Right;  . I&d extremity Right 04/14/2013    Procedure: IRRIGATION AND DEBRIDEMENT OF RIGHT  GROIN & PLACEMENT OF VAC DRESSING;  Surgeon: Fransisco HertzBrian L Chen, MD;  Location: [facility] OR;  Service: Vascular;  Laterality: Right;    Allergies  Allergen Reactions  . Statins     Muscle aches  . Amlodipine Besylate     Muscle aches making difficult to walk  . Cephalexin      REACTION: Hives    Current Outpatient Prescriptions  Medication Sig Dispense Refill  . acetaminophen (TYLENOL) 325 MG tablet Take 2 tablets (650 mg total) by mouth every 6 (six) hours as needed for mild pain or fever.  30 tablet  0  . collagenase (SANTYL) ointment Apply 1 application topically daily.  30 g  1  . ezetimibe (ZETIA) 10 MG tablet Take 1 tablet (10 mg total) by mouth daily.  30 tablet  1  . gabapentin (NEURONTIN) 100 MG capsule Take 1 capsule (100 mg total) by mouth 3 (three) times daily.  90 capsule  1  . glimepiride (AMARYL) 4 MG tablet Take 2 tablets (8 mg total) by mouth daily with breakfast.  180 tablet  3  . LANTUS 100 UNIT/ML injection       . lisinopril (PRINIVIL,ZESTRIL) 5 MG tablet Take 1 tablet (5 mg total) by mouth daily.  30 tablet  11  . metFORMIN (GLUCOPHAGE) 500 MG tablet Take 1 tablet (500 mg total) by mouth 2 (two) times daily with a meal.  60 tablet  1  . metoprolol succinate (TOPROL-XL) 50 MG 24 hr tablet Take 1 tablet (50 mg total) by mouth 2 (two) times daily. Take with or immediately following a meal.  60 tablet  0  . nitroGLYCERIN (NITROSTAT) 0.4 MG SL tablet Place 1 tablet (0.4 mg total) under the tongue every 5 (five) minutes as needed for chest pain.  30 tablet  12  . NOVOLOG 100 UNIT/ML injection       . oxyCODONE (OXY IR/ROXICODONE) 5 MG immediate release tablet Take 1-2 tablets (5-10 mg total) by mouth every 4 (four) hours as needed for moderate pain.  60 tablet  0  . oxyCODONE-acetaminophen (PERCOCET/ROXICET) 5-325 MG per tablet 5-325 tablets.      . pantoprazole (PROTONIX) 40 MG tablet Take 1 tablet (40 mg total) by mouth daily.  30 tablet  1  . Rivaroxaban (XARELTO) 20 MG TABS tablet Take 1 tablet (20 mg total) by mouth daily at 6 PM.  30 tablet  1  . terbinafine (LAMISIL) 250 MG tablet        No current facility-administered medications for this visit.    ROS: See HPI for pertinent positives and negatives.   Physical Examination  Filed  Vitals:   07/06/13 1518  Pulse: 53  Temp: 98.1 F (36.7 C)  Resp: 14   Filed Weights   07/06/13 1518  Weight: 292 lb (132.45 kg)   Body mass index is 43.1  kg/(m^2).  General: A&O x 3, WDWN, morbidly obese male, in w/c, accompanied by friend. Pulmonary: Respirations are non labored. Cardiac:RRR.     ASSESSMENT: Leroy [name] is a 68 y.o. male who is s/p I &D of right groin with placement of right groin wound vac on 04/14/2013, right BKA on 04/14/2013,  Right femoral endarterectomy with patch on 03/25/2014, and left BKA on 06/03/2012. Dr. Arbie Cookey examined right stump surgical wound dehisced area, is healing/granulating well, no s/s's or infection.   PLAN:  Continue daily wet to dry dressings with topical enzymatic debridement per Home Health.  Patient was advised to notify our office should ne have any further concerns re his right stump wound.  Follow up with Dr. Imogene Burn in 2 weeks.  The patient was given information about PAD including signs, symptoms, treatment, what symptoms should prompt the patient to seek immediate medical care, and risk reduction measures to take.  Charisse March, RN, MSN, FNP-C [facility] [place] Phone: [phone]  Clinic MD: Early  07/06/2013 3:15 PM

## 2013-07-06 NOTE — Patient Instructions (Signed)
Peripheral Vascular Disease Peripheral Vascular Disease (PVD), also called Peripheral Arterial Disease (PAD), is a circulation problem caused by cholesterol (atherosclerotic plaque) deposits in the arteries. PVD commonly occurs in the lower extremities (legs) but it can occur in other areas of the body, such as your arms. The cholesterol buildup in the arteries reduces blood flow which can cause pain and other serious problems. The presence of PVD can place a person at risk for Coronary Artery Disease (CAD).  CAUSES  Causes of PVD can be many. It is usually associated with more than one risk factor such as:   High Cholesterol.  Smoking.  Diabetes.  Lack of exercise or inactivity.  High blood pressure (hypertension).  Obesity.  Family history. SYMPTOMS   When the lower extremities are affected, patients with PVD may experience:  Leg pain with exertion or physical activity. This is called INTERMITTENT CLAUDICATION. This may present as cramping or numbness with physical activity. The location of the pain is associated with the level of blockage. For example, blockage at the abdominal level (distal abdominal aorta) may result in buttock or hip pain. Lower leg arterial blockage may result in calf pain.  As PVD becomes more severe, pain can develop with less physical activity.  In people with severe PVD, leg pain may occur at rest.  Other PVD signs and symptoms:  Leg numbness or weakness.  Coldness in the affected leg or foot, especially when compared to the other leg.  A change in leg color.  Patients with significant PVD are more prone to ulcers or sores on toes, feet or legs. These may take longer to heal or may reoccur. The ulcers or sores can become infected.  If signs and symptoms of PVD are ignored, gangrene may occur. This can result in the loss of toes or loss of an entire limb.  Not all leg pain is related to PVD. Other medical conditions can cause leg pain such  as:  Blood clots (embolism) or Deep Vein Thrombosis.  Inflammation of the blood vessels (vasculitis).  Spinal stenosis. DIAGNOSIS  Diagnosis of PVD can involve several different types of tests. These can include:  Pulse Volume Recording Method (PVR). This test is simple, painless and does not involve the use of X-rays. PVR involves measuring and comparing the blood pressure in the arms and legs. An ABI (Ankle-Brachial Index) is calculated. The normal ratio of blood pressures is 1. As this number becomes smaller, it indicates more severe disease.  < 0.95  indicates significant narrowing in one or more leg vessels.  <0.8 there will usually be pain in the foot, leg or buttock with exercise.  <0.4 will usually have pain in the legs at rest.  <0.25  usually indicates limb threatening PVD.  Doppler detection of pulses in the legs. This test is painless and checks to see if you have a pulses in your legs/feet.  A dye or contrast material (a substance that highlights the blood vessels so they show up on x-ray) may be given to help your caregiver better see the arteries for the following tests. The dye is eliminated from your body by the kidney's. Your caregiver may order blood work to check your kidney function and other laboratory values before the following tests are performed:  Magnetic Resonance Angiography (MRA). An MRA is a picture study of the blood vessels and arteries. The MRA machine uses a large magnet to produce images of the blood vessels.  Computed Tomography Angiography (CTA). A CTA is a   specialized x-ray that looks at how the blood flows in your blood vessels. An IV may be inserted into your arm so contrast dye can be injected.  Angiogram. Is a procedure that uses x-rays to look at your blood vessels. This procedure is minimally invasive, meaning a small incision (cut) is made in your groin. A small tube (catheter) is then inserted into the artery of your groin. The catheter is  guided to the blood vessel or artery your caregiver wants to examine. Contrast dye is injected into the catheter. X-rays are then taken of the blood vessel or artery. After the images are obtained, the catheter is taken out. TREATMENT  Treatment of PVD involves many interventions which may include:  Lifestyle changes:  Quitting smoking.  Exercise.  Following a low fat, low cholesterol diet.  Control of diabetes.  Foot care is very important to the PVD patient. Good foot care can help prevent infection.  Medication:  Cholesterol-lowering medicine.  Blood pressure medicine.  Anti-platelet drugs.  Certain medicines may reduce symptoms of Intermittent Claudication.  Interventional/Surgical options:  Angioplasty. An Angioplasty is a procedure that inflates a balloon in the blocked artery. This opens the blocked artery to improve blood flow.  Stent Implant. A wire mesh tube (stent) is placed in the artery. The stent expands and stays in place, allowing the artery to remain open.  Peripheral Bypass Surgery. This is a surgical procedure that reroutes the blood around a blocked artery to help improve blood flow. This type of procedure may be performed if Angioplasty or stent implants are not an option. SEEK IMMEDIATE MEDICAL CARE IF:   You develop pain or numbness in your arms or legs.  Your arm or leg turns cold, becomes blue in color.  You develop redness, warmth, swelling and pain in your arms or legs. MAKE SURE YOU:   Understand these instructions.  Will watch your condition.  Will get help right away if you are not doing well or get worse. Document Released: 05/02/2004 Document Revised: 06/17/2011 Document Reviewed: 03/29/2008 ExitCare Patient Information 2014 [employer].  

## 2013-07-19 ENCOUNTER — Telehealth: Payer: Self-pay | Admitting: Internal Medicine

## 2013-07-19 MED ORDER — METFORMIN HCL 500 MG PO TABS: 500.0000 mg | ORAL_TABLET | Freq: Two times a day (BID) | ORAL | Status: DC

## 2013-07-19 NOTE — Telephone Encounter (Signed)
Rx approved and refilled x 11.

## 2013-07-19 NOTE — Telephone Encounter (Signed)
Patient inquiring about refill of Metformin.  He states that pharmacy has called but he still cannot get refills.  It appears that he has refills available.  Please advise.

## 2013-07-22 ENCOUNTER — Telehealth: Payer: Self-pay

## 2013-07-22 NOTE — Telephone Encounter (Signed)
[facility] Anessa.  Reported that pt. is scheduled to be discharged today from home health.  Inquired if Dr. Imogene Burnhen would want to refer pt. to Wound Care Ctr., due to small area of right BKA stump incision that hasn't completely healed.  Reports there is a 1.4 x 1.8 cm. Open area on right stump incision.  Reports that the area has improved, however.  Also reports that pt. has a certified nursing asst. that is doing the dressing changes, when [facility].  Stated the certified nursing asst. "is proficient in doing the wound care."    [facility], pt. Isn't scheduled for follow up in the office until 4/24, and Dr. Imogene Burnhen will not make a referral to the Wound Ctr. until he reassesses the wound.  [facility] stated she will discharge him today, and inform the pt. and the CNA. to call office if worsening of wound prior to appt.

## 2013-07-29 ENCOUNTER — Encounter: Payer: Self-pay | Admitting: Vascular Surgery

## 2013-07-30 ENCOUNTER — Encounter: Payer: Self-pay | Admitting: Vascular Surgery

## 2013-07-30 ENCOUNTER — Ambulatory Visit (INDEPENDENT_AMBULATORY_CARE_PROVIDER_SITE_OTHER): Payer: Self-pay | Admitting: Vascular Surgery

## 2013-07-30 VITALS — BP 143/97 | HR 72 | Ht 69.0 in | Wt 292.0 lb

## 2013-07-30 DIAGNOSIS — I739 Peripheral vascular disease, unspecified: Secondary | ICD-10-CM

## 2013-07-30 DIAGNOSIS — L98499 Non-pressure chronic ulcer of skin of other sites with unspecified severity: Principal | ICD-10-CM

## 2013-07-30 DIAGNOSIS — Z89512 Acquired absence of left leg below knee: Secondary | ICD-10-CM

## 2013-07-30 DIAGNOSIS — Z89511 Acquired absence of right leg below knee: Secondary | ICD-10-CM

## 2013-07-30 DIAGNOSIS — S88119A Complete traumatic amputation at level between knee and ankle, unspecified lower leg, initial encounter: Secondary | ICD-10-CM

## 2013-07-30 MED ORDER — SILVER SULFADIAZINE 1 % EX CREA: 1.0000 "application " | TOPICAL_CREAM | Freq: Every day | CUTANEOUS | Status: DC

## 2013-07-30 NOTE — Progress Notes (Signed)
   Postoperative Visit   History of Present Illness  Leroy [name] is a 68 y.o. male   who presents for postoperative follow-up for: R Below-the-knee amputation , R groin I&D , VAC dressing (Date: 04/14/13). The patient notes pain is well controlled. The patient's current symptoms are: none.   R AKA wound continues to be open.  For VQI Use Only  PRE-ADM LIVING: Home  AMB STATUS: Wheelchair, ambulating with walker   Physical Examination  Filed Vitals:   07/30/13 1056  BP: 143/97  Pulse: 72  Height: 5\' 9"  (1.753 m)  Weight: 292 lb (132.45 kg)  SpO2: 94%    RUE: R groin incision is healed. R lateral corner of BKA: persistent unepithelialized wound unchanged in size, good granulation underneath  Medical Decision Making  Leroy [name] is a 68 y.o. male  who presents s/p R below-the-knee amputation, s/p R ilifemoral EA with BPA   Wound looks clean.  Will try Silvadene BID to see if sterilizing the wound will speed up closure. Refer patient to Wound Care Clinic also. The patient will follow up in 1 month to re-evaluate the wound.  The patient can follow up with [place] as needed.  [name], MD [facility] [place] Office: [phone] Pager: [phone]  07/30/2013, 11:19 AM

## 2013-08-10 ENCOUNTER — Encounter (HOSPITAL_BASED_OUTPATIENT_CLINIC_OR_DEPARTMENT_OTHER): Payer: Medicare HMO | Attending: General Surgery

## 2013-08-10 DIAGNOSIS — Z794 Long term (current) use of insulin: Secondary | ICD-10-CM | POA: Insufficient documentation

## 2013-08-10 DIAGNOSIS — I2510 Atherosclerotic heart disease of native coronary artery without angina pectoris: Secondary | ICD-10-CM | POA: Insufficient documentation

## 2013-08-10 DIAGNOSIS — I739 Peripheral vascular disease, unspecified: Secondary | ICD-10-CM | POA: Insufficient documentation

## 2013-08-10 DIAGNOSIS — Z7901 Long term (current) use of anticoagulants: Secondary | ICD-10-CM | POA: Insufficient documentation

## 2013-08-10 DIAGNOSIS — T8789 Other complications of amputation stump: Secondary | ICD-10-CM | POA: Insufficient documentation

## 2013-08-10 DIAGNOSIS — Y835 Amputation of limb(s) as the cause of abnormal reaction of the patient, or of later complication, without mention of misadventure at the time of the procedure: Secondary | ICD-10-CM | POA: Insufficient documentation

## 2013-08-10 DIAGNOSIS — Z951 Presence of aortocoronary bypass graft: Secondary | ICD-10-CM | POA: Insufficient documentation

## 2013-08-10 DIAGNOSIS — S88119A Complete traumatic amputation at level between knee and ankle, unspecified lower leg, initial encounter: Secondary | ICD-10-CM | POA: Insufficient documentation

## 2013-08-10 DIAGNOSIS — Z79899 Other long term (current) drug therapy: Secondary | ICD-10-CM | POA: Insufficient documentation

## 2013-08-10 DIAGNOSIS — E119 Type 2 diabetes mellitus without complications: Secondary | ICD-10-CM | POA: Insufficient documentation

## 2013-08-11 NOTE — H&P (Signed)
NAME:  Leroy [name], Leroy [name] NO.:  [id number]  MEDICAL RECORD NO.:  [id number]  LOCATION:  FOOT                         FACILITY:  [facility]  PHYSICIAN:  Joanne Gavel, M.D.        DATE OF BIRTH:  27-Jul-1945  DATE OF ADMISSION:  08/10/2013 DATE OF DISCHARGE:                             HISTORY & PHYSICAL   CHIEF COMPLAINT:  Unhealed wound, right BKA stump.  HISTORY OF PRESENT ILLNESS:  This is a 68 year old male with a long history of cardiovascular and peripheral vascular disease.  He is a type 2 diabetic.  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  PAST MEDICAL HISTORY:  Significant for left eye cataract, congestive heart failure, diabetes, coronary artery disease with multiple infarctions.  PAST SURGICAL HISTORY:  Right BKA, left BKA in 2014, CABG x4 in 2001, and multiple cardiac catheterizations.  SOCIAL HISTORY:  Cigarettes none.  Alcohol none.  MEDICATIONS:  __________, gabapentin, glipizide, Lantus, lisinopril, metformin, metoprolol, nitroglycerin, NovoLog, Xarelto.  ALLERGIES:  AMLODIPINE, STATINS, AND CEPHALEXIN.  REVIEW OF SYSTEMS:  As above.  PHYSICAL EXAMINATION:  VITAL SIGNS:  Temperature 98, pulse 65, respirations 14, blood pressure was unable to obtain.  Capillary glucose 114. GENERAL APPEARANCE:  Well developed, somewhat obese, in no distress. CHEST:  Clear. HEART:  Regular rhythm. EXTREMITIES:  The patient is status post left BKA.  The prosthesis was left in place.  There is a right BKA stump with a 1.0 x 2.7 x 0.5 wound. The base of the wound is fibrous and has some necrotic skin edges. These are sharply debrided.  Culture was taken.  IMPRESSION:  Unhealed right below-knee amputation stump.  PLAN:  We will start with a silver collagen every other day, and we will see him in 1 week.     Joanne Gavel, M.D.     RA/MEDQ  D:  08/10/2013  T:  08/11/2013  Job:  [id number]  cc:   Dr. Imogene Burn

## 2013-08-16 ENCOUNTER — Encounter
Payer: Commercial Managed Care - HMO | Attending: Physical Medicine & Rehabilitation | Admitting: Physical Medicine & Rehabilitation

## 2013-08-16 ENCOUNTER — Encounter: Payer: Self-pay | Admitting: Physical Medicine & Rehabilitation

## 2013-08-16 VITALS — BP 139/73 | HR 67 | Resp 14 | Ht 69.0 in | Wt 292.0 lb

## 2013-08-16 DIAGNOSIS — I739 Peripheral vascular disease, unspecified: Secondary | ICD-10-CM | POA: Insufficient documentation

## 2013-08-16 DIAGNOSIS — Z89511 Acquired absence of right leg below knee: Secondary | ICD-10-CM

## 2013-08-16 DIAGNOSIS — Z89512 Acquired absence of left leg below knee: Secondary | ICD-10-CM

## 2013-08-16 DIAGNOSIS — M79609 Pain in unspecified limb: Secondary | ICD-10-CM

## 2013-08-16 DIAGNOSIS — I2589 Other forms of chronic ischemic heart disease: Secondary | ICD-10-CM | POA: Insufficient documentation

## 2013-08-16 DIAGNOSIS — E1142 Type 2 diabetes mellitus with diabetic polyneuropathy: Secondary | ICD-10-CM | POA: Insufficient documentation

## 2013-08-16 DIAGNOSIS — I2510 Atherosclerotic heart disease of native coronary artery without angina pectoris: Secondary | ICD-10-CM | POA: Insufficient documentation

## 2013-08-16 DIAGNOSIS — Z951 Presence of aortocoronary bypass graft: Secondary | ICD-10-CM | POA: Insufficient documentation

## 2013-08-16 DIAGNOSIS — G546 Phantom limb syndrome with pain: Secondary | ICD-10-CM | POA: Insufficient documentation

## 2013-08-16 DIAGNOSIS — E1149 Type 2 diabetes mellitus with other diabetic neurological complication: Secondary | ICD-10-CM | POA: Insufficient documentation

## 2013-08-16 DIAGNOSIS — G547 Phantom limb syndrome without pain: Secondary | ICD-10-CM

## 2013-08-16 DIAGNOSIS — S88119A Complete traumatic amputation at level between knee and ankle, unspecified lower leg, initial encounter: Secondary | ICD-10-CM | POA: Insufficient documentation

## 2013-08-16 NOTE — Progress Notes (Signed)
Subjective:    Patient ID: Leroy [name], male    DOB: 1945-08-10, 68 y.o.   MRN: [id number]  HPI  Leroy [name] is back regarding his right BKA. His right leg failed to heal once again and he had another debridement of the wound apparently last week through the wound care clinic. A nurse is coming out to the house to change the wound every other day.   His sugars have been running between 90 and 120 generally.   His pain is minimal to none at this point. He is still taking the gabapentin---he has phantom limb sensations every once and awhile.   His left BK remains a little large.  Pain Inventory Average Pain 0 Pain Right Now 0 My pain is no pain  In the last 24 hours, has pain interfered with the following? General activity 0 Relation with others 0 Enjoyment of life 0 What TIME of day is your pain at its worst? no pain Sleep (in general) Poor  Pain is worse with: na Pain improves with: na Relief from Meds: 0  Mobility do you drive?  yes use a wheelchair transfers alone Do you have any goals in this area?  yes  Function Do you have any goals in this area?  no  Neuro/Psych No problems in this area  Prior Studies Any changes since last visit?  no  Physicians involved in your care Any changes since last visit?  no   Family History  Problem Relation Age of Onset  . Heart disease Mother     before age 58  . Diabetes Mother   . Heart attack Mother   . Heart disease Father    History   Social History  . Marital Status: Legally Separated    Spouse Name: N/A    Number of Children: N/A  . Years of Education: N/A   Social History Main Topics  . Smoking status: Former Smoker    Types: Cigarettes  . Smokeless tobacco: Never Used     Comment: quit smoking 8yrs ago  . Alcohol Use: No     Comment: occasionally  . Drug Use: No  . Sexual Activity: Not Currently   Other Topics Concern  . None   Social History Narrative  . None   Past Surgical History    Procedure Laterality Date  . Revasculariztion  2011/2010    x 2   . Coronary artery bypass graft  03/2000    quadruple  . Tonsillectomy    . Hernia repair    . Cardiac catheterization  03/19/10  . Abdominal aortagram  04-30-12  . Adenoidectomy    . Amputation Left 06/03/2012    Procedure: AMPUTATION BELOW KNEE;  Surgeon: [name], MD;  Location: [facility] OR;  Service: Vascular;  Laterality: Left;  . Coronary angioplasty  2002    3 stents  . Endarterectomy femoral Right 03/25/2013    Procedure: ENDARTERECTOMY FEMORAL ;  Surgeon: [name], MD;  Location: [facility] OR;  Service: Vascular;  Laterality: Right;  . Patch angioplasty Right 03/25/2013    Procedure: PATCH ANGIOPLASTY USING VASCU-GUARD PERIPHERAL VASCULAR PATCH;  Surgeon: [name], MD;  Location: [facility] OR;  Service: Vascular;  Laterality: Right;  . Amputation Right 04/14/2013    Procedure: AMPUTATION BELOW KNEE ;  Surgeon: [name], MD;  Location: [facility] OR;  Service: Vascular;  Laterality: Right;  . I&d extremity Right 04/14/2013    Procedure: IRRIGATION AND DEBRIDEMENT OF RIGHT  GROIN &  PLACEMENT OF VAC DRESSING;  Surgeon: Fransisco HertzBrian L Chen, MD;  [facility];  Service: Vascular;  Laterality: Right;   Past Medical History  Diagnosis Date  . Hyperlipidemia   . Ischemic cardiomyopathy     a. EF 40% 2010. b. Echo (2/14):  mild LVH, EF 30-35%, diff HK, Gr 1 DD, MAC, mild MR, mod LAE, PASP 39  . Cataract   . Hypotension   . Paroxysmal atrial fibrillation   . Peripheral vascular disease     a. s/p L BKA 2014.  [name]. History of blood clots     in legs--this was in 2011--has been off of Coumadin 303months;takes Xarelto daily  . Peripheral neuropathy   . Joint pain   . H/O hiatal hernia   . Chronic kidney disease     on Lisinopril to protect kidneys d/t being on Metformin per pt  . Myocardial infarction     total of 8 heart attacks;last one in 2011  . Atrial flutter   . H/O medication noncompliance     Due to insurance issues  . Chronic  systolic CHF (congestive heart failure)   . Coronary artery disease     a. s/p remote CABG 2001 at [facility] b. Cath 03/2010: for med rx.;  c.  Eugenie BirksLexiscan myoview (1/14):  Large Inferior apical MI, very small area of peri-infarct ischemia toward the inferior apical segment. EF 19%.  Med Rx was continued.    . Diabetes mellitus 1979    takes Metformni daily  and Lantus 50units bid   BP 139/73  Pulse 67  Resp 14  Ht 5\' 9"  (1.753 m)  Wt 292 lb (132.45 kg)  BMI 43.10 kg/m2  SpO2 97%  Opioid Risk Score:   Fall Risk Score: High Fall Risk (>13 points) (pt educated on fall risk, brochure given to pt )   Review of Systems  All other systems reviewed and are negative.      Objective:   Physical Exam  Constitutional: He is oriented to person, place, and time. Obese  HENT: dentition poor  Head: Normocephalic.  Eyes: EOM are normal.  Neck: Normal range of motion. Neck supple. No thyromegaly present.  Cardiovascular: Normal rate and regular rhythm. No murmurs or rubs  Respiratory: Effort normal and breath sounds normal except for a few expiratory wheezes. No respiratory distress.  GI: Soft. Bowel sounds are normal. He exhibits no distension.  Neurological: He is alert and oriented to person, place, and time. CN exam normal. Good insight and awareness. UES 5/5 proximal to distal. Bilateral HF and KE near 5/5.  Skin: central area of incision still open. RLE Wrapped in ACE. left leg intact. Socket too big.    Assessment/Plan:  1. Functional deficits secondary to new right BKA, recent left BKA  -still waiting for his wound to heal. ---follow up per the wound care clinic---I did not remove his dressing today -he ultimately will be a K3 ambulator 2. Pain Management: wean gabapentin slowly to off.  3. 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 Advanced to follow up for adjustment/replacement once the fitting process on the right begins.  4.. Diabetes mellitus with peripheral neuropathy. He has improved his  glycemic control.  5. Follow up with me in 3 months.   15 minutes of face to face patient care time were spent during this visit. All questions were encouraged and answered.

## 2013-08-16 NOTE — Patient Instructions (Signed)
GABAPENTIN: DECREASE TO TWICE DAILY FOR ONE WEEK, THEN ONCE DAILY FOR ONE WEEK THEN OFF. IF YOU BEGIN TO HAVE MORE PHANTOM PAIN, THEN RESTART THE GABAPENTIN

## 2013-08-26 ENCOUNTER — Encounter: Payer: Self-pay | Admitting: Vascular Surgery

## 2013-08-27 ENCOUNTER — Encounter: Payer: Self-pay | Admitting: Vascular Surgery

## 2013-08-27 ENCOUNTER — Ambulatory Visit (INDEPENDENT_AMBULATORY_CARE_PROVIDER_SITE_OTHER): Payer: Self-pay | Admitting: Vascular Surgery

## 2013-08-27 VITALS — BP 123/85 | HR 73 | Ht 69.0 in | Wt 292.0 lb

## 2013-08-27 DIAGNOSIS — I739 Peripheral vascular disease, unspecified: Secondary | ICD-10-CM

## 2013-08-27 DIAGNOSIS — L98499 Non-pressure chronic ulcer of skin of other sites with unspecified severity: Principal | ICD-10-CM

## 2013-08-27 NOTE — Progress Notes (Signed)
  Postoperative Visit   History of Present Illness  Leroy [name] is a 68 y.o. male  who presents for postoperative follow-up for: R Below-the-knee amputation , R groin I&D , VAC dressing (Date: 04/14/13). The patient notes pain is well controlled. The patient's current symptoms are: none. R AKA wound continues to be open.  Pt now is under care of wound care.  They have debride the ulcerated area twice to date.  For VQI Use Only   PRE-ADM LIVING: Home   AMB STATUS: Wheelchair, ambulating with walker   Physical Examination  Filed Vitals:   08/27/13 0834  BP: 123/85  Pulse: 73  Height: 5\' 9"  (1.753 m)  Weight: 292 lb (132.45 kg)  SpO2: 100%   RUE: R groin incision is healed. BKA is healed except for ulcerated area which is still clean with good granulation tissue  Medical Decision Making  Leroy [name] is a 68 y.o. male who presents s/p R below-the-knee amputation, s/p R iliofemoral EA with BPA  Will defer mgmt of R BKA wound to Wound Care The patient will follow up in 1 month to re-evaluate the wound.   [name], MD [facility] Office: [phone] Pager: [phone]  08/27/2013, 8:40 AM

## 2013-09-07 ENCOUNTER — Encounter (HOSPITAL_BASED_OUTPATIENT_CLINIC_OR_DEPARTMENT_OTHER): Payer: Commercial Managed Care - HMO | Attending: General Surgery

## 2013-09-07 ENCOUNTER — Telehealth: Payer: Self-pay

## 2013-09-07 DIAGNOSIS — E1169 Type 2 diabetes mellitus with other specified complication: Secondary | ICD-10-CM | POA: Diagnosis not present

## 2013-09-07 DIAGNOSIS — Y835 Amputation of limb(s) as the cause of abnormal reaction of the patient, or of later complication, without mention of misadventure at the time of the procedure: Secondary | ICD-10-CM | POA: Insufficient documentation

## 2013-09-07 DIAGNOSIS — T8789 Other complications of amputation stump: Secondary | ICD-10-CM | POA: Diagnosis not present

## 2013-09-07 DIAGNOSIS — L97809 Non-pressure chronic ulcer of other part of unspecified lower leg with unspecified severity: Secondary | ICD-10-CM | POA: Diagnosis not present

## 2013-09-07 NOTE — Telephone Encounter (Signed)
Refill request from [facility] for gabapentin 100 mg.  Please advise.

## 2013-09-08 MED ORDER — GABAPENTIN 100 MG PO CAPS: 100.0000 mg | ORAL_CAPSULE | Freq: Three times a day (TID) | ORAL | Status: DC

## 2013-09-08 NOTE — Telephone Encounter (Signed)
done

## 2013-09-14 DIAGNOSIS — E1169 Type 2 diabetes mellitus with other specified complication: Secondary | ICD-10-CM | POA: Diagnosis not present

## 2013-09-14 DIAGNOSIS — L97809 Non-pressure chronic ulcer of other part of unspecified lower leg with unspecified severity: Secondary | ICD-10-CM | POA: Diagnosis not present

## 2013-09-14 DIAGNOSIS — T8789 Other complications of amputation stump: Secondary | ICD-10-CM | POA: Diagnosis not present

## 2013-09-21 DIAGNOSIS — T8789 Other complications of amputation stump: Secondary | ICD-10-CM | POA: Diagnosis not present

## 2013-09-21 DIAGNOSIS — E1169 Type 2 diabetes mellitus with other specified complication: Secondary | ICD-10-CM | POA: Diagnosis not present

## 2013-09-21 DIAGNOSIS — L97809 Non-pressure chronic ulcer of other part of unspecified lower leg with unspecified severity: Secondary | ICD-10-CM | POA: Diagnosis not present

## 2013-09-28 DIAGNOSIS — L97809 Non-pressure chronic ulcer of other part of unspecified lower leg with unspecified severity: Secondary | ICD-10-CM | POA: Diagnosis not present

## 2013-09-28 DIAGNOSIS — T8789 Other complications of amputation stump: Secondary | ICD-10-CM | POA: Diagnosis not present

## 2013-09-28 DIAGNOSIS — E1169 Type 2 diabetes mellitus with other specified complication: Secondary | ICD-10-CM | POA: Diagnosis not present

## 2013-09-29 ENCOUNTER — Other Ambulatory Visit: Payer: Self-pay

## 2013-09-29 MED ORDER — PANTOPRAZOLE SODIUM 40 MG PO TBEC: 40.0000 mg | DELAYED_RELEASE_TABLET | Freq: Every day | ORAL | Status: DC

## 2013-09-29 MED ORDER — BD SWAB SINGLE USE REGULAR PADS: MEDICATED_PAD | Status: DC

## 2013-09-29 MED ORDER — GLUCOSE BLOOD VI STRP: ORAL_STRIP | Status: DC

## 2013-09-29 MED ORDER — RIVAROXABAN 20 MG PO TABS: 20.0000 mg | ORAL_TABLET | Freq: Every day | ORAL | Status: DC

## 2013-09-29 MED ORDER — ACCU-CHEK FASTCLIX LANCET KIT: PACK | Status: DC

## 2013-09-29 MED ORDER — ACCU-CHEK SMARTVIEW CONTROL VI LIQD: Status: DC

## 2013-09-29 MED ORDER — ACCU-CHEK NANO SMARTVIEW W/DEVICE KIT: PACK | Status: DC

## 2013-09-30 ENCOUNTER — Encounter: Payer: Self-pay | Admitting: Vascular Surgery

## 2013-10-01 ENCOUNTER — Encounter: Payer: Self-pay | Admitting: Vascular Surgery

## 2013-10-01 ENCOUNTER — Ambulatory Visit (INDEPENDENT_AMBULATORY_CARE_PROVIDER_SITE_OTHER): Payer: Self-pay | Admitting: Vascular Surgery

## 2013-10-01 VITALS — BP 101/39 | HR 74 | Ht 69.0 in | Wt 292.0 lb

## 2013-10-01 DIAGNOSIS — Z89512 Acquired absence of left leg below knee: Principal | ICD-10-CM

## 2013-10-01 DIAGNOSIS — S88119A Complete traumatic amputation at level between knee and ankle, unspecified lower leg, initial encounter: Secondary | ICD-10-CM

## 2013-10-01 DIAGNOSIS — Z89511 Acquired absence of right leg below knee: Secondary | ICD-10-CM

## 2013-10-01 NOTE — Progress Notes (Signed)
    Postoperative Visit   History of Present Illness  Leroy GriffithsWilliam J [name] is a 68 y.o. male who presents for postoperative follow-up for: R below-the-knee amputation (Date: 04/14/13).  The patient's wounds are not healed.  The patient notes pain is well controlled.  The patient's current symptoms are: continue drainage from chronic ulcer in mid-med aspect of R BKA.  For VQI Use Only  PRE-ADM LIVING: Home  AMB STATUS: Wheelchair  Physical Examination  Filed Vitals:   10/01/13 1315  BP: 101/39  Pulse: 74   RLE: Incision is healed except for ulcer in mid-medial staple line,  Good granulation tissue present, ~50% wound contraction  Medical Decision Making  Leroy [name] is a 68 y.o. male who presents s/p B Below-the-knee ampuation.   Continue wound care with wound center for another 3 months.  At this point, the patient is reluctant to consider R BKA, and the patient appears to be healing somewhat so I would wait and see if he can heal the R BKA stump on his own.  Follow up in 3 months  Leonides SakeBrian Christain Mcraney, MD [facility] [place] Office: [phone] Pager: [phone]  10/01/2013, 1:57 PM

## 2013-10-05 DIAGNOSIS — T8789 Other complications of amputation stump: Secondary | ICD-10-CM | POA: Diagnosis not present

## 2013-10-05 DIAGNOSIS — E1169 Type 2 diabetes mellitus with other specified complication: Secondary | ICD-10-CM | POA: Diagnosis not present

## 2013-10-05 DIAGNOSIS — L97809 Non-pressure chronic ulcer of other part of unspecified lower leg with unspecified severity: Secondary | ICD-10-CM | POA: Diagnosis not present

## 2013-10-10 DIAGNOSIS — Z91199 Patient's noncompliance with other medical treatment and regimen due to unspecified reason: Secondary | ICD-10-CM

## 2013-10-10 DIAGNOSIS — E1149 Type 2 diabetes mellitus with other diabetic neurological complication: Secondary | ICD-10-CM | POA: Diagnosis not present

## 2013-10-10 DIAGNOSIS — Z9119 Patient's noncompliance with other medical treatment and regimen: Secondary | ICD-10-CM

## 2013-10-10 DIAGNOSIS — G909 Disorder of the autonomic nervous system, unspecified: Secondary | ICD-10-CM | POA: Diagnosis not present

## 2013-10-10 DIAGNOSIS — S88119A Complete traumatic amputation at level between knee and ankle, unspecified lower leg, initial encounter: Secondary | ICD-10-CM | POA: Diagnosis not present

## 2013-10-10 DIAGNOSIS — Z794 Long term (current) use of insulin: Secondary | ICD-10-CM

## 2013-10-10 DIAGNOSIS — Z4889 Encounter for other specified surgical aftercare: Secondary | ICD-10-CM | POA: Diagnosis not present

## 2013-10-10 DIAGNOSIS — I4891 Unspecified atrial fibrillation: Secondary | ICD-10-CM

## 2013-10-10 DIAGNOSIS — Z7901 Long term (current) use of anticoagulants: Secondary | ICD-10-CM

## 2013-10-10 DIAGNOSIS — I739 Peripheral vascular disease, unspecified: Secondary | ICD-10-CM

## 2013-10-12 ENCOUNTER — Encounter (HOSPITAL_BASED_OUTPATIENT_CLINIC_OR_DEPARTMENT_OTHER): Payer: Commercial Managed Care - HMO | Attending: General Surgery

## 2013-10-12 ENCOUNTER — Telehealth: Payer: Self-pay | Admitting: *Deleted

## 2013-10-12 DIAGNOSIS — S88119A Complete traumatic amputation at level between knee and ankle, unspecified lower leg, initial encounter: Secondary | ICD-10-CM | POA: Insufficient documentation

## 2013-10-12 DIAGNOSIS — T8189XA Other complications of procedures, not elsewhere classified, initial encounter: Secondary | ICD-10-CM | POA: Diagnosis not present

## 2013-10-12 DIAGNOSIS — L97509 Non-pressure chronic ulcer of other part of unspecified foot with unspecified severity: Secondary | ICD-10-CM | POA: Insufficient documentation

## 2013-10-12 DIAGNOSIS — Y835 Amputation of limb(s) as the cause of abnormal reaction of the patient, or of later complication, without mention of misadventure at the time of the procedure: Secondary | ICD-10-CM | POA: Insufficient documentation

## 2013-10-12 DIAGNOSIS — E119 Type 2 diabetes mellitus without complications: Secondary | ICD-10-CM

## 2013-10-12 DIAGNOSIS — E1169 Type 2 diabetes mellitus with other specified complication: Secondary | ICD-10-CM | POA: Insufficient documentation

## 2013-10-12 NOTE — Telephone Encounter (Signed)
Left message on machine for patient to schedule an appointment to follow up diabetes. A1c, bmet oredered Diabetic bundle

## 2013-10-19 DIAGNOSIS — T8189XA Other complications of procedures, not elsewhere classified, initial encounter: Secondary | ICD-10-CM | POA: Diagnosis not present

## 2013-10-19 DIAGNOSIS — S88119A Complete traumatic amputation at level between knee and ankle, unspecified lower leg, initial encounter: Secondary | ICD-10-CM | POA: Diagnosis not present

## 2013-10-19 DIAGNOSIS — E1169 Type 2 diabetes mellitus with other specified complication: Secondary | ICD-10-CM | POA: Diagnosis not present

## 2013-10-19 DIAGNOSIS — L97509 Non-pressure chronic ulcer of other part of unspecified foot with unspecified severity: Secondary | ICD-10-CM | POA: Diagnosis not present

## 2013-10-26 DIAGNOSIS — L97509 Non-pressure chronic ulcer of other part of unspecified foot with unspecified severity: Secondary | ICD-10-CM | POA: Diagnosis not present

## 2013-10-26 DIAGNOSIS — T8189XA Other complications of procedures, not elsewhere classified, initial encounter: Secondary | ICD-10-CM | POA: Diagnosis not present

## 2013-10-26 DIAGNOSIS — S88119A Complete traumatic amputation at level between knee and ankle, unspecified lower leg, initial encounter: Secondary | ICD-10-CM | POA: Diagnosis not present

## 2013-10-26 DIAGNOSIS — E1169 Type 2 diabetes mellitus with other specified complication: Secondary | ICD-10-CM | POA: Diagnosis not present

## 2013-11-02 DIAGNOSIS — E1169 Type 2 diabetes mellitus with other specified complication: Secondary | ICD-10-CM | POA: Diagnosis not present

## 2013-11-02 DIAGNOSIS — S88119A Complete traumatic amputation at level between knee and ankle, unspecified lower leg, initial encounter: Secondary | ICD-10-CM | POA: Diagnosis not present

## 2013-11-02 DIAGNOSIS — T8189XA Other complications of procedures, not elsewhere classified, initial encounter: Secondary | ICD-10-CM | POA: Diagnosis not present

## 2013-11-02 DIAGNOSIS — L97509 Non-pressure chronic ulcer of other part of unspecified foot with unspecified severity: Secondary | ICD-10-CM | POA: Diagnosis not present

## 2013-11-09 ENCOUNTER — Encounter (HOSPITAL_BASED_OUTPATIENT_CLINIC_OR_DEPARTMENT_OTHER): Payer: Medicare HMO | Attending: General Surgery

## 2013-11-09 DIAGNOSIS — S88119A Complete traumatic amputation at level between knee and ankle, unspecified lower leg, initial encounter: Secondary | ICD-10-CM | POA: Diagnosis not present

## 2013-11-09 DIAGNOSIS — T8189XA Other complications of procedures, not elsewhere classified, initial encounter: Secondary | ICD-10-CM | POA: Diagnosis not present

## 2013-11-09 DIAGNOSIS — Y835 Amputation of limb(s) as the cause of abnormal reaction of the patient, or of later complication, without mention of misadventure at the time of the procedure: Secondary | ICD-10-CM | POA: Insufficient documentation

## 2013-11-09 DIAGNOSIS — T8789 Other complications of amputation stump: Secondary | ICD-10-CM | POA: Insufficient documentation

## 2013-11-12 ENCOUNTER — Encounter: Payer: Medicare HMO | Attending: Physical Medicine & Rehabilitation | Admitting: Physical Medicine & Rehabilitation

## 2013-11-12 ENCOUNTER — Encounter: Payer: Self-pay | Admitting: Physical Medicine & Rehabilitation

## 2013-11-12 VITALS — BP 138/48 | HR 79 | Resp 14 | Ht 69.0 in | Wt 292.0 lb

## 2013-11-12 DIAGNOSIS — E1149 Type 2 diabetes mellitus with other diabetic neurological complication: Secondary | ICD-10-CM | POA: Insufficient documentation

## 2013-11-12 DIAGNOSIS — I739 Peripheral vascular disease, unspecified: Secondary | ICD-10-CM | POA: Insufficient documentation

## 2013-11-12 DIAGNOSIS — I2510 Atherosclerotic heart disease of native coronary artery without angina pectoris: Secondary | ICD-10-CM | POA: Insufficient documentation

## 2013-11-12 DIAGNOSIS — S88119A Complete traumatic amputation at level between knee and ankle, unspecified lower leg, initial encounter: Secondary | ICD-10-CM | POA: Diagnosis not present

## 2013-11-12 DIAGNOSIS — G547 Phantom limb syndrome without pain: Secondary | ICD-10-CM | POA: Diagnosis not present

## 2013-11-12 DIAGNOSIS — G546 Phantom limb syndrome with pain: Secondary | ICD-10-CM

## 2013-11-12 DIAGNOSIS — Z89512 Acquired absence of left leg below knee: Secondary | ICD-10-CM

## 2013-11-12 DIAGNOSIS — Z951 Presence of aortocoronary bypass graft: Secondary | ICD-10-CM | POA: Insufficient documentation

## 2013-11-12 DIAGNOSIS — Z89511 Acquired absence of right leg below knee: Secondary | ICD-10-CM

## 2013-11-12 DIAGNOSIS — E1142 Type 2 diabetes mellitus with diabetic polyneuropathy: Secondary | ICD-10-CM | POA: Insufficient documentation

## 2013-11-12 DIAGNOSIS — I2589 Other forms of chronic ischemic heart disease: Secondary | ICD-10-CM | POA: Insufficient documentation

## 2013-11-12 NOTE — Patient Instructions (Addendum)
YOU NEED TO CHECK YOUR SUGARS NOT ONLY IN THE MORNING BUT ALSO AT LUNCH, DINNER AND BEDTIME.  HAVE [facility] CONTACT us FOR A PROSTHETIC PRESCRIPTION WHEN YOU ARE READY FOR CONSTRUCTION.

## 2013-11-12 NOTE — Progress Notes (Signed)
Subjective:    Patient ID: Leroy [name], male    DOB: 03-24-1946, 68 y.o.   MRN: [id number]  HPI  Leroy [name] is back regarding his bilateral BKA's. His right leg STILL hasn't healed despite ongoing treatment at the wound care center. He has received an artificial graft to help with healing about 2 weeks ago most recently.   He has weaned off his gabapentin without issue. He has no pain currently.   He is having problems with his glucose control however.    Pain Inventory  Average Pain 0 Pain Right Now 0 My pain is no pain  In the last 24 hours, has pain interfered with the following? General activity 0 Relation with others 0 Enjoyment of life 0 What TIME of day is your pain at its worst? no pain Sleep (in general) Fair  Pain is worse with: na Pain improves with: na Relief from Meds: no pain meds  Mobility ability to climb steps?  no do you drive?  no use a wheelchair Do you have any goals in this area?  no  Function retired I need assistance with the following:  meal prep, household duties and shopping Do you have any goals in this area?  no  Neuro/Psych No problems in this area  Prior Studies Any changes since last visit?  no  Physicians involved in your care Any changes since last visit?  no   Family History  Problem Relation Age of Onset  . Heart disease Mother     before age 69  . Diabetes Mother   . Heart attack Mother   . Heart disease Father    History   Social History  . Marital Status: Legally Separated    Spouse Name: N/A    Number of Children: N/A  . Years of Education: N/A   Social History Main Topics  . Smoking status: Former Smoker    Types: Cigarettes  . Smokeless tobacco: Never Used     Comment: quit smoking 65yrs ago  . Alcohol Use: No     Comment: occasionally  . Drug Use: No  . Sexual Activity: Not Currently   Other Topics Concern  . None   Social History Narrative  . None   Past Surgical History  Procedure  Laterality Date  . Revasculariztion  2011/2010    x 2   . Coronary artery bypass graft  03/2000    quadruple  . Tonsillectomy    . Hernia repair    . Cardiac catheterization  03/19/10  . Abdominal aortagram  04-30-12  . Adenoidectomy    . Amputation Left 06/03/2012    Procedure: AMPUTATION BELOW KNEE;  Surgeon: [name], MD;  Location: [facility] OR;  Service: Vascular;  Laterality: Left;  . Coronary angioplasty  2002    3 stents  . Endarterectomy femoral Right 03/25/2013    Procedure: ENDARTERECTOMY FEMORAL ;  Surgeon: [name], MD;  Location: [facility] OR;  Service: Vascular;  Laterality: Right;  . Patch angioplasty Right 03/25/2013    Procedure: PATCH ANGIOPLASTY USING VASCU-GUARD PERIPHERAL VASCULAR PATCH;  Surgeon: [name], MD;  Location: [facility] OR;  Service: Vascular;  Laterality: Right;  . Amputation Right 04/14/2013    Procedure: AMPUTATION BELOW KNEE ;  Surgeon: [name], MD;  Location: [facility] OR;  Service: Vascular;  Laterality: Right;  . I&d extremity Right 04/14/2013    Procedure: IRRIGATION AND DEBRIDEMENT OF RIGHT  GROIN & PLACEMENT OF VAC DRESSING;  Surgeon:  Fransisco HertzBrian L Chen, MD;  [facility];  Service: Vascular;  Laterality: Right;   Past Medical History  Diagnosis Date  . Hyperlipidemia   . Ischemic cardiomyopathy     a. EF 40% 2010. b. Echo (2/14):  mild LVH, EF 30-35%, diff HK, Gr 1 DD, MAC, mild MR, mod LAE, PASP 39  . Cataract   . Hypotension   . Paroxysmal atrial fibrillation   . Peripheral vascular disease     a. s/p L BKA 2014.  [name]. History of blood clots     in legs--this was in 2011--has been off of Coumadin 133months;takes Xarelto daily  . Peripheral neuropathy   . Joint pain   . H/O hiatal hernia   . Chronic kidney disease     on Lisinopril to protect kidneys d/t being on Metformin per pt  . Myocardial infarction     total of 8 heart attacks;last one in 2011  . Atrial flutter   . H/O medication noncompliance     Due to insurance issues  . Chronic systolic CHF  (congestive heart failure)   . Coronary artery disease     a. s/p remote CABG 2001 at [facility] b. Cath 03/2010: for med rx.;  c.  Eugenie BirksLexiscan myoview (1/14):  Large Inferior apical MI, very small area of peri-infarct ischemia toward the inferior apical segment. EF 19%.  Med Rx was continued.    . Diabetes mellitus 1979    takes Metformni daily  and Lantus 50units bid   BP 138/48  Pulse 79  Resp 14  Ht 5\' 9"  (1.753 m)  Wt 292 lb (132.45 kg)  BMI 43.10 kg/m2  SpO2 97%  Opioid Risk Score:   Fall Risk Score: High Fall Risk (>13 points) (pt educated brochure declined)    Review of Systems  All other systems reviewed and are negative.      Objective:   Physical Exam  Constitutional: He is oriented to person, place, and time. Obese  HENT: dentition poor  Head: Normocephalic.  Eyes: EOM are normal.  Neck: Normal range of motion. Neck supple. No thyromegaly present.  Cardiovascular: Normal rate and regular rhythm. No murmurs or rubs  Respiratory: Effort normal and breath sounds normal except for a few expiratory wheezes. No respiratory distress.  GI: Soft. Bowel sounds are normal. He exhibits no distension.  Neurological: He is alert and oriented to person, place, and time. CN exam normal. Good insight and awareness. UES 5/5 proximal to distal. Bilateral HF and KE near 5/5.  Skin: Right stump with foam dressing in place--i did not remove..     Assessment/Plan:  1. Functional deficits secondary to new right BKA, recent left BKA  -still waiting for his wound to heal. ---follow up per the wound care clinic. -he ultimately will be a K3 ambulator when ready 2. Pain Management: no pain--tylenol prn 3. 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 Advanced eventuallyfor adjustment/replacement once the fitting process on the right side begins.  4.. Diabetes mellitus with peripheral neuropathy. He understands the importance of better control.   -encouraged him to check his cbg's at other times during the  day 5. Follow up with me prn. 15 minutes of face to face patient care time were spent during this visit. All questions were encouraged and answered.

## 2013-11-16 DIAGNOSIS — T8789 Other complications of amputation stump: Secondary | ICD-10-CM | POA: Diagnosis not present

## 2013-11-16 DIAGNOSIS — S88119A Complete traumatic amputation at level between knee and ankle, unspecified lower leg, initial encounter: Secondary | ICD-10-CM | POA: Diagnosis not present

## 2013-11-16 DIAGNOSIS — T8189XA Other complications of procedures, not elsewhere classified, initial encounter: Secondary | ICD-10-CM | POA: Diagnosis not present

## 2013-11-23 DIAGNOSIS — T8789 Other complications of amputation stump: Secondary | ICD-10-CM | POA: Diagnosis not present

## 2013-11-23 DIAGNOSIS — S88119A Complete traumatic amputation at level between knee and ankle, unspecified lower leg, initial encounter: Secondary | ICD-10-CM | POA: Diagnosis not present

## 2013-11-23 DIAGNOSIS — T8189XA Other complications of procedures, not elsewhere classified, initial encounter: Secondary | ICD-10-CM | POA: Diagnosis not present

## 2013-11-30 DIAGNOSIS — S88119A Complete traumatic amputation at level between knee and ankle, unspecified lower leg, initial encounter: Secondary | ICD-10-CM | POA: Diagnosis not present

## 2013-11-30 DIAGNOSIS — T8189XA Other complications of procedures, not elsewhere classified, initial encounter: Secondary | ICD-10-CM | POA: Diagnosis not present

## 2013-11-30 DIAGNOSIS — T8789 Other complications of amputation stump: Secondary | ICD-10-CM | POA: Diagnosis not present

## 2013-12-07 ENCOUNTER — Encounter (HOSPITAL_BASED_OUTPATIENT_CLINIC_OR_DEPARTMENT_OTHER): Payer: Medicare HMO | Attending: General Surgery

## 2013-12-07 DIAGNOSIS — Y835 Amputation of limb(s) as the cause of abnormal reaction of the patient, or of later complication, without mention of misadventure at the time of the procedure: Secondary | ICD-10-CM | POA: Diagnosis not present

## 2013-12-07 DIAGNOSIS — E1169 Type 2 diabetes mellitus with other specified complication: Secondary | ICD-10-CM | POA: Insufficient documentation

## 2013-12-07 DIAGNOSIS — S88119A Complete traumatic amputation at level between knee and ankle, unspecified lower leg, initial encounter: Secondary | ICD-10-CM | POA: Diagnosis not present

## 2013-12-07 DIAGNOSIS — T8189XA Other complications of procedures, not elsewhere classified, initial encounter: Secondary | ICD-10-CM | POA: Insufficient documentation

## 2013-12-14 DIAGNOSIS — S88119A Complete traumatic amputation at level between knee and ankle, unspecified lower leg, initial encounter: Secondary | ICD-10-CM | POA: Diagnosis not present

## 2013-12-14 DIAGNOSIS — E1169 Type 2 diabetes mellitus with other specified complication: Secondary | ICD-10-CM | POA: Diagnosis not present

## 2013-12-14 DIAGNOSIS — T8189XA Other complications of procedures, not elsewhere classified, initial encounter: Secondary | ICD-10-CM | POA: Diagnosis not present

## 2013-12-21 DIAGNOSIS — S88119A Complete traumatic amputation at level between knee and ankle, unspecified lower leg, initial encounter: Secondary | ICD-10-CM | POA: Diagnosis not present

## 2013-12-21 DIAGNOSIS — T8189XA Other complications of procedures, not elsewhere classified, initial encounter: Secondary | ICD-10-CM | POA: Diagnosis not present

## 2013-12-21 DIAGNOSIS — E1169 Type 2 diabetes mellitus with other specified complication: Secondary | ICD-10-CM | POA: Diagnosis not present

## 2013-12-23 ENCOUNTER — Encounter: Payer: Self-pay | Admitting: Vascular Surgery

## 2013-12-24 ENCOUNTER — Encounter: Payer: Self-pay | Admitting: Vascular Surgery

## 2013-12-24 ENCOUNTER — Ambulatory Visit (INDEPENDENT_AMBULATORY_CARE_PROVIDER_SITE_OTHER): Payer: Commercial Managed Care - HMO | Admitting: Vascular Surgery

## 2013-12-24 VITALS — BP 144/75 | HR 73 | Ht 69.0 in | Wt 292.0 lb

## 2013-12-24 DIAGNOSIS — I739 Peripheral vascular disease, unspecified: Secondary | ICD-10-CM

## 2013-12-24 DIAGNOSIS — L98499 Non-pressure chronic ulcer of skin of other sites with unspecified severity: Principal | ICD-10-CM

## 2013-12-24 NOTE — Progress Notes (Signed)
   Postoperative Visit   History of Present Illness  Leroy [name] is a 68 y.o. male  who presents for postoperative follow-up for: R below-the-knee amputation (Date: 04/14/13). The patient's wounds is nearly healed. The patient notes pain is well controlled. The patient's current symptoms are: small defect in R AKA  For VQI Use Only   PRE-ADM LIVING: Home   AMB STATUS: Wheelchair    Physical Examination  Filed Vitals:   12/24/13 1204  BP: 144/75  Pulse: 73  Height: 5\' 9"  (1.753 m)  Weight: 292 lb (132.45 kg)  SpO2: 96%    RLE: Incision is nearly healed except for small ulcer in R medial BKA  Medical Decision Making  Leroy [name] is a 68 y.o. male  who presents s/p B Below-the-knee ampuation.  Follow in one month for wound check If closed, I will refer the patient to prosthetic fitting  [name], MD [facility] [place] Office: [phone] Pager: [phone]  12/24/2013, 12:44 PM

## 2013-12-28 DIAGNOSIS — S88119A Complete traumatic amputation at level between knee and ankle, unspecified lower leg, initial encounter: Secondary | ICD-10-CM | POA: Diagnosis not present

## 2013-12-28 DIAGNOSIS — E1169 Type 2 diabetes mellitus with other specified complication: Secondary | ICD-10-CM | POA: Diagnosis not present

## 2013-12-28 DIAGNOSIS — T8189XA Other complications of procedures, not elsewhere classified, initial encounter: Secondary | ICD-10-CM | POA: Diagnosis not present

## 2014-01-04 DIAGNOSIS — E1169 Type 2 diabetes mellitus with other specified complication: Secondary | ICD-10-CM | POA: Diagnosis not present

## 2014-01-04 DIAGNOSIS — S88119A Complete traumatic amputation at level between knee and ankle, unspecified lower leg, initial encounter: Secondary | ICD-10-CM | POA: Diagnosis not present

## 2014-01-04 DIAGNOSIS — T8189XA Other complications of procedures, not elsewhere classified, initial encounter: Secondary | ICD-10-CM | POA: Diagnosis not present

## 2014-01-11 ENCOUNTER — Encounter (HOSPITAL_BASED_OUTPATIENT_CLINIC_OR_DEPARTMENT_OTHER): Payer: Medicare HMO | Attending: General Surgery

## 2014-01-11 DIAGNOSIS — T8743 Infection of amputation stump, right lower extremity: Secondary | ICD-10-CM | POA: Insufficient documentation

## 2014-01-18 DIAGNOSIS — T8743 Infection of amputation stump, right lower extremity: Secondary | ICD-10-CM | POA: Diagnosis not present

## 2014-01-20 ENCOUNTER — Encounter: Payer: Self-pay | Admitting: Vascular Surgery

## 2014-01-21 ENCOUNTER — Ambulatory Visit: Payer: Commercial Managed Care - HMO | Admitting: Vascular Surgery

## 2014-01-25 DIAGNOSIS — T8743 Infection of amputation stump, right lower extremity: Secondary | ICD-10-CM | POA: Diagnosis not present

## 2014-01-28 ENCOUNTER — Encounter: Payer: Self-pay | Admitting: Internal Medicine

## 2014-01-28 ENCOUNTER — Other Ambulatory Visit (INDEPENDENT_AMBULATORY_CARE_PROVIDER_SITE_OTHER): Payer: Commercial Managed Care - HMO

## 2014-01-28 ENCOUNTER — Ambulatory Visit (INDEPENDENT_AMBULATORY_CARE_PROVIDER_SITE_OTHER): Payer: Commercial Managed Care - HMO | Admitting: Internal Medicine

## 2014-01-28 VITALS — BP 118/68 | HR 73 | Temp 97.8°F | Resp 16 | Ht 69.0 in

## 2014-01-28 DIAGNOSIS — E1165 Type 2 diabetes mellitus with hyperglycemia: Secondary | ICD-10-CM

## 2014-01-28 DIAGNOSIS — E785 Hyperlipidemia, unspecified: Secondary | ICD-10-CM

## 2014-01-28 DIAGNOSIS — Z23 Encounter for immunization: Secondary | ICD-10-CM

## 2014-01-28 DIAGNOSIS — E118 Type 2 diabetes mellitus with unspecified complications: Secondary | ICD-10-CM

## 2014-01-28 DIAGNOSIS — N5201 Erectile dysfunction due to arterial insufficiency: Secondary | ICD-10-CM | POA: Insufficient documentation

## 2014-01-28 DIAGNOSIS — I480 Paroxysmal atrial fibrillation: Secondary | ICD-10-CM

## 2014-01-28 DIAGNOSIS — IMO0002 Reserved for concepts with insufficient information to code with codable children: Secondary | ICD-10-CM

## 2014-01-28 LAB — CBC WITH DIFFERENTIAL/PLATELET
Basophils Absolute: 0 10*3/uL (ref 0.0–0.1)
Basophils Relative: 0.7 % (ref 0.0–3.0)
Eosinophils Absolute: 0.1 10*3/uL (ref 0.0–0.7)
Eosinophils Relative: 2.7 % (ref 0.0–5.0)
HCT: 46 % (ref 39.0–52.0)
Hemoglobin: 15.1 g/dL (ref 13.0–17.0)
Lymphocytes Relative: 38.6 % (ref 12.0–46.0)
Lymphs Abs: 2.1 10*3/uL (ref 0.7–4.0)
MCHC: 32.8 g/dL (ref 30.0–36.0)
MCV: 92 fl (ref 78.0–100.0)
Monocytes Absolute: 0.5 10*3/uL (ref 0.1–1.0)
Monocytes Relative: 9 % (ref 3.0–12.0)
Neutro Abs: 2.7 10*3/uL (ref 1.4–7.7)
Neutrophils Relative %: 49 % (ref 43.0–77.0)
Platelets: 187 10*3/uL (ref 150.0–400.0)
RBC: 4.99 Mil/uL (ref 4.22–5.81)
RDW: 13.6 % (ref 11.5–15.5)
WBC: 5.5 10*3/uL (ref 4.0–10.5)

## 2014-01-28 LAB — LIPID PANEL
Cholesterol: 214 mg/dL — ABNORMAL HIGH (ref 0–200)
HDL: 38.5 mg/dL — ABNORMAL LOW (ref >39.00)
LDL Cholesterol: 155 mg/dL — ABNORMAL HIGH (ref 0–99)
NonHDL: 175.5
Total CHOL/HDL Ratio: 6
Triglycerides: 101 mg/dL (ref 0.0–149.0)
VLDL: 20.2 mg/dL (ref 0.0–40.0)

## 2014-01-28 LAB — BASIC METABOLIC PANEL
BUN: 9 mg/dL (ref 6–23)
CHLORIDE: 108 meq/L (ref 96–112)
CO2: 20 mEq/L (ref 19–32)
CREATININE: 1.1 mg/dL (ref 0.4–1.5)
Calcium: 9 mg/dL (ref 8.4–10.5)
GFR: 88.64 mL/min (ref >60.00)
GLUCOSE: 116 mg/dL — AB (ref 70–99)
Potassium: 3.6 mEq/L (ref 3.5–5.1)
Sodium: 140 mEq/L (ref 135–145)

## 2014-01-28 LAB — HEMOGLOBIN A1C: Hgb A1c MFr Bld: 8.3 % — ABNORMAL HIGH (ref 4.6–6.5)

## 2014-01-28 LAB — TSH: TSH: 1.08 u[IU]/mL (ref 0.35–4.50)

## 2014-01-28 MED ORDER — INSULIN ASPART 100 UNIT/ML ~~LOC~~ SOLN: 5.0000 [IU] | Freq: Three times a day (TID) | SUBCUTANEOUS | Status: DC

## 2014-01-28 MED ORDER — INSULIN GLARGINE 100 UNIT/ML ~~LOC~~ SOLN: 45.0000 [IU] | Freq: Two times a day (BID) | SUBCUTANEOUS | Status: DC

## 2014-01-28 NOTE — Progress Notes (Signed)
Subjective:    Patient ID: Leroy [name], male    DOB: 07/05/1945, 68 y.o.   MRN: [id number]  Diabetes He presents for his follow-up diabetic visit. He has type 2 diabetes mellitus. His disease course has been fluctuating. There are no hypoglycemic associated symptoms. Associated symptoms include polyuria. Pertinent negatives for diabetes include no blurred vision, no chest pain, no fatigue, no foot paresthesias, no foot ulcerations, no polydipsia, no polyphagia, no visual change, no weakness and no weight loss. There are no hypoglycemic complications. Symptoms are stable. Diabetic complications include heart disease and PVD. Current diabetic treatment includes oral agent (monotherapy), insulin injections and intensive insulin program. He is compliant with treatment most of the time. His weight is stable. He is following a generally healthy diet. Meal planning includes avoidance of concentrated sweets. He has not had a previous visit with a dietician. He never participates in exercise. His home blood glucose trend is decreasing steadily. An ACE inhibitor/angiotensin II receptor blocker is being taken. He does not see a podiatrist.Eye exam is not current.      Review of Systems  Constitutional: Negative for fever, chills, weight loss, diaphoresis, appetite change and fatigue.  HENT: Negative.   Eyes: Negative.  Negative for blurred vision.  Respiratory: Negative.  Negative for cough, choking, chest tightness, shortness of breath and stridor.   Cardiovascular: Negative.  Negative for chest pain, palpitations and leg swelling.  Gastrointestinal: Negative.  Negative for nausea, vomiting, abdominal pain, diarrhea, constipation and blood in stool.  Endocrine: Positive for polyuria. Negative for polydipsia and polyphagia.  Genitourinary: Negative.   Musculoskeletal: Positive for arthralgias. Negative for back pain, gait problem, joint swelling, myalgias, neck pain and neck stiffness.       Rt stump  and phantom limb pain  Skin: Negative.  Negative for rash.  Allergic/Immunologic: Negative.   Neurological: Negative.  Negative for weakness.  Hematological: Negative.  Negative for adenopathy. Does not bruise/bleed easily.  Psychiatric/Behavioral: Negative.        Objective:   Physical Exam  Vitals reviewed. Constitutional: He is oriented to person, place, and time. He appears well-developed and well-nourished. No distress.  Wheelchair bound B BKA  HENT:  Head: Normocephalic and atraumatic.  Mouth/Throat: Oropharynx is clear and moist. No oropharyngeal exudate.  Eyes: Conjunctivae are normal. Right eye exhibits no discharge. Left eye exhibits no discharge. No scleral icterus.  Neck: Normal range of motion. Neck supple. No JVD present. No tracheal deviation present. No thyromegaly present.  Cardiovascular: Normal rate, regular rhythm, normal heart sounds and intact distal pulses.  Exam reveals no gallop and no friction rub.   No murmur heard. Pulmonary/Chest: Effort normal and breath sounds normal. No stridor. No respiratory distress. He has no wheezes. He has no rales. He exhibits no tenderness.  Abdominal: Soft. Bowel sounds are normal. He exhibits no distension and no mass. There is no tenderness. There is no rebound and no guarding.  Musculoskeletal: Normal range of motion. He exhibits no edema and no tenderness.  Lymphadenopathy:    He has no cervical adenopathy.  Neurological: He is oriented to person, place, and time.  Skin: Skin is warm and dry. No rash noted. He is not diaphoretic. No erythema. No pallor.     Lab Results  Component Value Date   WBC 11.4* 04/22/2013   HGB 11.6* 04/22/2013   HCT 35.1* 04/22/2013   PLT 387 04/22/2013   GLUCOSE 234* 05/05/2013   CHOL 256* 01/06/2013   TRIG 189.0* 01/06/2013  HDL 42.20 01/06/2013   LDLDIRECT 206.8 01/06/2013   LDLCALC  Value: 182        Total Cholesterol/HDL:CHD Risk Coronary Heart Disease Risk Table                     Men    Women  1/2 Average Risk   3.4   3.3  Average Risk       5.0   4.4  2 X Average Risk   9.6   7.1  3 X Average Risk  23.4   11.0        Use the calculated Patient Ratio above and the CHD Risk Table to determine the patient's CHD Risk.        ATP III CLASSIFICATION (LDL):  <100     mg/dL   Optimal  832-919  mg/dL   Near or Above                    Optimal  130-159  mg/dL   Borderline  166-060  mg/dL   High  >045     mg/dL   Very High* 99/77/4142   ALT 36 04/22/2013   AST 30 04/22/2013   NA 139 05/05/2013   K 4.2 05/05/2013   CL 102 05/05/2013   CREATININE 0.99 05/05/2013   BUN 12 05/05/2013   CO2 24 05/05/2013   TSH 0.40 01/06/2013   INR 1.17 04/12/2013   HGBA1C 12.4* 03/25/2013       Assessment & Plan:

## 2014-01-28 NOTE — Progress Notes (Signed)
Pre visit review using our clinic review tool, if applicable. No additional management support is needed unless otherwise documented below in the visit note. 

## 2014-01-28 NOTE — Patient Instructions (Signed)

## 2014-01-29 NOTE — Assessment & Plan Note (Signed)
His blood sugars are improving I have asked him to be more compliant with his medical regimen

## 2014-01-29 NOTE — Assessment & Plan Note (Signed)
He has good rate and rhythm control 

## 2014-01-29 NOTE — Assessment & Plan Note (Signed)
GU referral at his request

## 2014-01-29 NOTE — Assessment & Plan Note (Signed)
He will not take a statin 

## 2014-02-01 DIAGNOSIS — T8743 Infection of amputation stump, right lower extremity: Secondary | ICD-10-CM | POA: Diagnosis not present

## 2014-02-03 ENCOUNTER — Encounter: Payer: Self-pay | Admitting: Vascular Surgery

## 2014-02-04 ENCOUNTER — Ambulatory Visit: Payer: Commercial Managed Care - HMO | Admitting: Vascular Surgery

## 2014-02-08 ENCOUNTER — Encounter (HOSPITAL_BASED_OUTPATIENT_CLINIC_OR_DEPARTMENT_OTHER): Payer: Commercial Managed Care - HMO | Attending: General Surgery

## 2014-02-08 DIAGNOSIS — Z89511 Acquired absence of right leg below knee: Secondary | ICD-10-CM | POA: Insufficient documentation

## 2014-02-08 DIAGNOSIS — T8789 Other complications of amputation stump: Secondary | ICD-10-CM | POA: Diagnosis not present

## 2014-02-08 LAB — GLUCOSE, CAPILLARY: Glucose-Capillary: 221 mg/dL — ABNORMAL HIGH (ref 70–99)

## 2014-02-15 DIAGNOSIS — T8789 Other complications of amputation stump: Secondary | ICD-10-CM | POA: Diagnosis not present

## 2014-02-15 DIAGNOSIS — Z89511 Acquired absence of right leg below knee: Secondary | ICD-10-CM | POA: Diagnosis not present

## 2014-02-17 ENCOUNTER — Encounter: Payer: Self-pay | Admitting: Vascular Surgery

## 2014-02-18 ENCOUNTER — Ambulatory Visit (INDEPENDENT_AMBULATORY_CARE_PROVIDER_SITE_OTHER): Payer: Commercial Managed Care - HMO | Admitting: Vascular Surgery

## 2014-02-18 ENCOUNTER — Encounter: Payer: Self-pay | Admitting: Vascular Surgery

## 2014-02-18 VITALS — BP 135/45 | HR 73 | Ht 69.0 in | Wt 292.0 lb

## 2014-02-18 DIAGNOSIS — Z89511 Acquired absence of right leg below knee: Secondary | ICD-10-CM

## 2014-02-18 DIAGNOSIS — Z89512 Acquired absence of left leg below knee: Secondary | ICD-10-CM

## 2014-02-18 NOTE — Progress Notes (Signed)
   Postoperative Visit   History of Present Illness  Leroy [name] is a 68 y.o. male who presents for postoperative follow-up for: R below-the-knee amputation (Date: 04/14/13). The patient's wounds is nearly healed. The patient notes pain is well controlled. The patient's current symptoms are: small defect in R AKA.  Reportedly since the prior visit, the prior wound opened up and has been contracting with VAC use.  The patient notes some serous drainage from a sinus in his R BKA stump    For VQI Use Only   PRE-ADM LIVING: Home   AMB STATUS: Wheelchair    Physical Examination  Filed Vitals:   02/18/14 1109  BP: 135/45  Pulse: 73  Height: 5\' 9"  (1.753 m)  Weight: 292 lb (132.45 kg)  SpO2: 98%    RLE: Incision is nearly healed except for small sinus in R BKA stump (<1 cm deep), macerated skin debrided from sinus, no pus, no smell  Medical Decision Making  Leroy [name] is a 68 y.o. male who presents s/p B Below-the-knee ampuation.   Follow in one month for wound check  If closed, I will refer the patient to prosthetic fitting   [name], MD [facility] [place] Office: [phone] Pager: [phone]  02/18/2014, 11:44 AM

## 2014-02-22 ENCOUNTER — Other Ambulatory Visit (HOSPITAL_BASED_OUTPATIENT_CLINIC_OR_DEPARTMENT_OTHER): Payer: Self-pay | Admitting: General Surgery

## 2014-02-22 ENCOUNTER — Ambulatory Visit (HOSPITAL_COMMUNITY)
Admission: RE | Admit: 2014-02-22 | Discharge: 2014-02-22 | Disposition: A | Discharge status: Home / Self Care | Payer: Medicare HMO | Source: Ambulatory Visit | Attending: Radiology | Admitting: Radiology

## 2014-02-22 DIAGNOSIS — Z89511 Acquired absence of right leg below knee: Secondary | ICD-10-CM | POA: Diagnosis not present

## 2014-02-22 DIAGNOSIS — M869 Osteomyelitis, unspecified: Secondary | ICD-10-CM | POA: Diagnosis not present

## 2014-02-22 DIAGNOSIS — T8789 Other complications of amputation stump: Secondary | ICD-10-CM | POA: Diagnosis not present

## 2014-02-23 ENCOUNTER — Telehealth: Payer: Self-pay | Admitting: *Deleted

## 2014-02-23 NOTE — Telephone Encounter (Signed)
Left [facility] on triage stating pt was referred to them by dr. Yetta Barre. Needing pt most resent PSA. Called ruth back no answer LMOM no PSA has been done per record...Raechel Chute

## 2014-03-01 DIAGNOSIS — T8789 Other complications of amputation stump: Secondary | ICD-10-CM | POA: Diagnosis not present

## 2014-03-01 DIAGNOSIS — Z89511 Acquired absence of right leg below knee: Secondary | ICD-10-CM | POA: Diagnosis not present

## 2014-03-08 ENCOUNTER — Encounter (HOSPITAL_BASED_OUTPATIENT_CLINIC_OR_DEPARTMENT_OTHER): Payer: Commercial Managed Care - HMO | Attending: General Surgery

## 2014-03-08 DIAGNOSIS — T8743 Infection of amputation stump, right lower extremity: Secondary | ICD-10-CM | POA: Insufficient documentation

## 2014-03-08 DIAGNOSIS — Y839 Surgical procedure, unspecified as the cause of abnormal reaction of the patient, or of later complication, without mention of misadventure at the time of the procedure: Secondary | ICD-10-CM | POA: Diagnosis not present

## 2014-03-15 DIAGNOSIS — T8743 Infection of amputation stump, right lower extremity: Secondary | ICD-10-CM | POA: Diagnosis not present

## 2014-03-17 ENCOUNTER — Encounter: Payer: Self-pay | Admitting: Vascular Surgery

## 2014-03-17 ENCOUNTER — Encounter (HOSPITAL_COMMUNITY): Payer: Self-pay | Admitting: Vascular Surgery

## 2014-03-18 ENCOUNTER — Ambulatory Visit: Payer: Commercial Managed Care - HMO | Admitting: Vascular Surgery

## 2014-03-22 DIAGNOSIS — T8743 Infection of amputation stump, right lower extremity: Secondary | ICD-10-CM | POA: Diagnosis not present

## 2014-03-29 DIAGNOSIS — T8743 Infection of amputation stump, right lower extremity: Secondary | ICD-10-CM | POA: Diagnosis not present

## 2014-04-05 DIAGNOSIS — T8743 Infection of amputation stump, right lower extremity: Secondary | ICD-10-CM | POA: Diagnosis not present

## 2014-04-12 ENCOUNTER — Encounter (HOSPITAL_BASED_OUTPATIENT_CLINIC_OR_DEPARTMENT_OTHER): Payer: Commercial Managed Care - HMO | Attending: General Surgery

## 2014-04-12 DIAGNOSIS — T879 Unspecified complications of amputation stump: Secondary | ICD-10-CM | POA: Diagnosis not present

## 2014-04-12 DIAGNOSIS — Y839 Surgical procedure, unspecified as the cause of abnormal reaction of the patient, or of later complication, without mention of misadventure at the time of the procedure: Secondary | ICD-10-CM | POA: Diagnosis not present

## 2014-04-12 DIAGNOSIS — Z89511 Acquired absence of right leg below knee: Secondary | ICD-10-CM | POA: Diagnosis not present

## 2014-04-14 ENCOUNTER — Encounter: Payer: Self-pay | Admitting: Vascular Surgery

## 2014-04-15 ENCOUNTER — Ambulatory Visit (INDEPENDENT_AMBULATORY_CARE_PROVIDER_SITE_OTHER): Payer: Commercial Managed Care - HMO | Admitting: Vascular Surgery

## 2014-04-15 ENCOUNTER — Other Ambulatory Visit: Payer: Self-pay

## 2014-04-15 ENCOUNTER — Encounter: Payer: Self-pay | Admitting: Vascular Surgery

## 2014-04-15 VITALS — BP 124/67 | HR 88 | Ht 69.0 in | Wt 292.0 lb

## 2014-04-15 DIAGNOSIS — L03119 Cellulitis of unspecified part of limb: Secondary | ICD-10-CM | POA: Insufficient documentation

## 2014-04-15 DIAGNOSIS — Z89512 Acquired absence of left leg below knee: Secondary | ICD-10-CM | POA: Diagnosis not present

## 2014-04-15 DIAGNOSIS — L02419 Cutaneous abscess of limb, unspecified: Secondary | ICD-10-CM | POA: Diagnosis not present

## 2014-04-15 DIAGNOSIS — Z89511 Acquired absence of right leg below knee: Secondary | ICD-10-CM

## 2014-04-15 NOTE — Progress Notes (Signed)
Established Critical Limb Ischemia Patient  History of Present Illness  Leroy [name] is a 69 y.o. (March 16, 1946) male who presents with chief complaint: drainage R AKA.  This patient is s/p B BKA.  R BKA was done 04/14/13.  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  He is draining pus from that sinus today.  He denies fever or chills.  He is under the wound care at [facility]    Past Medical History  Diagnosis Date  . Hyperlipidemia   . Ischemic cardiomyopathy     a. EF 40% 2010. b. Echo (2/14):  mild LVH, EF 30-35%, diff HK, Gr 1 DD, MAC, mild MR, mod LAE, PASP 39  . Cataract   . Hypotension   . Paroxysmal atrial fibrillation   . Peripheral vascular disease     a. s/p L BKA 2014.  [name] History of blood clots     in legs--this was in 2011--has been off of Coumadin 52month;takes Xarelto daily  . Peripheral neuropathy   . Joint pain   . H/O hiatal hernia   . Chronic kidney disease     on Lisinopril to protect kidneys d/t being on Metformin per pt  . Myocardial infarction     total of 8 heart attacks;last one in 2011  . Atrial flutter   . H/O medication noncompliance     Due to insurance issues  . Chronic systolic CHF (congestive heart failure)   . Coronary artery disease     a. s/p remote CABG 2001 at [facility] b. Cath 03/2010: for med rx.;  c.  [facility] Adammyoview (1/14):  Large Inferior apical MI, very small area of peri-infarct ischemia toward the inferior apical segment. EF 19%.  Med Rx was continued.    . Diabetes mellitus 1979    takes Metformni daily  and Lantus 50units bid    Past Surgical History  Procedure Laterality Date  . Revasculariztion  2011/2010    x 2   . Coronary artery bypass graft  03/2000    quadruple  . Tonsillectomy    . Hernia repair    . Cardiac catheterization  03/19/10  . Abdominal aortagram  04-30-12  . Adenoidectomy    . Amputation Left 06/03/2012    Procedure: AMPUTATION BELOW KNEE;  Surgeon: BConrad Marlboro MD;  [place]  Service: Vascular;  Laterality: Left;  . Coronary angioplasty  2002    3 stents  . Endarterectomy femoral Right 03/25/2013    Procedure: ENDARTERECTOMY FEMORAL ;  Surgeon: BConrad Loon Lake MD;  [place]  Service: Vascular;  Laterality: Right;  . Patch angioplasty Right 03/25/2013    Procedure: PATCH ANGIOPLASTY USING VASCU-GUARD PERIPHERAL VASCULAR PATCH;  Surgeon: BConrad Petaluma MD;  [place]  Service: Vascular;  Laterality: Right;  . Amputation Right 04/14/2013    Procedure: AMPUTATION BELOW KNEE ;  Surgeon: BConrad Ceres MD;  [place]  Service: Vascular;  Laterality: Right;  . I&d extremity Right 04/14/2013    Procedure: IRRIGATION AND DEBRIDEMENT OF RIGHT  GROIN & PLACEMENT OF VAC DRESSING;  Surgeon: BConrad Leonville MD;  [place]  Service: Vascular;  Laterality: Right;  . Abdominal aortagram N/A 04/30/2012    Procedure: ABDOMINAL AMaxcine Ham  Surgeon: BConrad Mayersville MD;  [facility];  Service: Cardiovascular;  Laterality: N/A;  . Lower extremity angiogram Bilateral 04/30/2012  Procedure: LOWER EXTREMITY ANGIOGRAM;  Surgeon: Conrad Apple Valley, MD;  Location: [facility] CATH LAB;  Service: Cardiovascular;  Laterality: Bilateral;  bilat lower extrem angio    History   Social History  . Marital Status: Legally Separated    Spouse Name: N/A    Number of Children: N/A  . Years of Education: N/A   Occupational History  . Not on file.   Social History Main Topics  . Smoking status: Former Smoker    Types: Cigarettes  . Smokeless tobacco: Never Used     Comment: quit smoking 52yr ago  . Alcohol Use: No     Comment: occasionally  . Drug Use: No  . Sexual Activity: Not Currently   Other Topics Concern  . Not on file   Social History Narrative    Family History  Problem Relation Age of Onset  . Heart disease Mother     before age 68 . Diabetes Mother   . Heart attack Mother   . Heart disease Father     Current Outpatient  Prescriptions on File Prior to Visit  Medication Sig Dispense Refill  . acetaminophen (TYLENOL) 325 MG tablet Take 2 tablets (650 mg total) by mouth every 6 (six) hours as needed for mild pain or fever. 30 tablet 0  . Alcohol Swabs (B-D SINGLE USE SWABS REGULAR) PADS     . Alcohol Swabs (B-D SINGLE USE SWABS REGULAR) PADS Test up to TID dx: 250.62 300 each 4  . Blood Glucose Calibration (ACCU-CHEK SMARTVIEW CONTROL) LIQD Test up to TID dx: 250.62 1 each 3  . Blood Glucose Monitoring Suppl (ACCU-CHEK NANO SMARTVIEW) W/DEVICE KIT Test up to TID dx: 250.62 1 kit 1  . collagenase (SANTYL) ointment Apply 1 application topically daily. 30 g 1  . ezetimibe (ZETIA) 10 MG tablet Take 1 tablet (10 mg total) by mouth daily. 30 tablet 1  . glimepiride (AMARYL) 4 MG tablet Take 2 tablets (8 mg total) by mouth daily with breakfast. 180 tablet 3  . glucose blood (ACCU-CHEK SMARTVIEW) test strip Test up to TID dx: 250.62 300 each 4  . insulin aspart (NOVOLOG) 100 UNIT/ML injection Inject 5 Units into the skin 3 (three) times daily with meals. 10 mL 11  . insulin glargine (LANTUS) 100 UNIT/ML injection Inject 0.45 mLs (45 Units total) into the skin 2 (two) times daily. 10 mL 11  . Lancets Misc. (ACCU-CHEK FASTCLIX LANCET) KIT Test up to TID dx: 250.62 1 kit 3  . lisinopril (PRINIVIL,ZESTRIL) 5 MG tablet Take 1 tablet (5 mg total) by mouth daily. 30 tablet 11  . metFORMIN (GLUCOPHAGE) 500 MG tablet Take 1 tablet (500 mg total) by mouth 2 (two) times daily with a meal. 60 tablet 11  . metoprolol succinate (TOPROL-XL) 50 MG 24 hr tablet Take 1 tablet (50 mg total) by mouth 2 (two) times daily. Take with or immediately following a meal. 60 tablet 0  . nitroGLYCERIN (NITROSTAT) 0.4 MG SL tablet Place 1 tablet (0.4 mg total) under the tongue every 5 (five) minutes as needed for chest pain. 30 tablet 12  . pantoprazole (PROTONIX) 40 MG tablet Take 1 tablet (40 mg total) by mouth daily. 30 tablet 5  . PRODIGY NO CODING  BLOOD GLUC test strip     . rivaroxaban (XARELTO) 20 MG TABS tablet Take 1 tablet (20 mg total) by mouth daily at 6 PM. 30 tablet 5  . silver sulfADIAZINE (SILVADENE) 1 % cream Apply 1 application topically daily.  50 g 0  . terbinafine (LAMISIL) 250 MG tablet      No current facility-administered medications on file prior to visit.    Allergies  Allergen Reactions  . Statins     Muscle aches  . Amlodipine Besylate     Muscle aches making difficult to walk  . Cephalexin     REACTION: Hives    REVIEW OF SYSTEMS:  (Positives checked otherwise negative)  CARDIOVASCULAR:  _0  chest pain, _1  chest pressure, _2  palpitations, _3  shortness of breath when laying flat, _4  shortness of breath with exertion,  _5  pain in feet when walking, _6  pain in feet when laying flat, _7  history of blood clot in veins (DVT), _8  history of phlebitis, _9  swelling in legs, _10  varicose veins  PULMONARY:  _11  productive cough, _12  asthma, _13  wheezing  NEUROLOGIC:  _14  weakness in arms or legs, _15  numbness in arms or legs, _16  difficulty speaking or slurred speech, _17  temporary loss of vision in one eye, _18  dizziness  HEMATOLOGIC:  _19  bleeding problems, _20  problems with blood clotting too easily  MUSCULOSKEL:  _21  joint pain, _22  joint swelling  GASTROINTEST:  _23  vomiting blood, _24  blood in stool     GENITOURINARY:  _25  burning with urination, _26  blood in urine  PSYCHIATRIC:  _27  history of major depression  INTEGUMENTARY:  _28  rashes, _29  ulcers     Physical Examination  Filed Vitals:   04/15/14 1413  BP: 124/67  Pulse: 88  Height: _30  (1.753 m)  Weight: 292 lb (132.45 kg)  SpO2: 96%   Body mass index is 43.1 kg/(m^2).  General: A&O x 3, WDWN  Eyes: PERRLA, EOMI  Pulmonary: Sym exp, good air movt, CTAB, no rales, rhonchi, & wheezing  Cardiac: RRR, Nl S1, S2, no Murmurs, rubs or gallops  Vascular: Vessel Right Left  Radial Palpable Palpable  Brachial Palpable **Palpable  Carotid Palpable,  without bruit Palpable, without bruit  Aorta Not palpable N/A  Femoral Palpable Palpable  Popliteal Not palpable Not palpable  PT BKA BKA  DP BKA BKA   Gastrointestinal: soft, NTND, -G/R, - HSM, - masses, - CVAT B  Musculoskeletal: M/S 5/5 throughout , L BKA healed, prosthesis on, R BKA with chronic sinus with frank pus draining, debris extract with probing with CTA, R BKA is cool  Neurologic: Pain and light touch intact in extremities , Motor exam as listed above   Medical Decision Making  Leroy [name] is a 69 y.o. male who presents with: s/p B BKA   I suspect some degree of compromise blood flow to the R BKA as the etiology for his persistent poor healing.   As a salvage maneuver, I rec: R BKA debridement and VAC placement.  If this fails, the patient will need to consider a R AKA.  Thank you for allowing [place] to participate in this patient's care.  Adele Barthel, MD [facility] [place] Office: [phone] Pager: [phone]  04/15/2014, 2:46 PM

## 2014-04-18 ENCOUNTER — Other Ambulatory Visit: Payer: Self-pay | Admitting: *Deleted

## 2014-04-18 DIAGNOSIS — T8189XD Other complications of procedures, not elsewhere classified, subsequent encounter: Secondary | ICD-10-CM

## 2014-04-19 ENCOUNTER — Encounter (HOSPITAL_COMMUNITY): Payer: Self-pay | Admitting: *Deleted

## 2014-04-19 DIAGNOSIS — Z89511 Acquired absence of right leg below knee: Secondary | ICD-10-CM | POA: Diagnosis not present

## 2014-04-19 DIAGNOSIS — T879 Unspecified complications of amputation stump: Secondary | ICD-10-CM | POA: Diagnosis not present

## 2014-04-19 MED ORDER — VANCOMYCIN HCL 10 G IV SOLR
1500.0000 mg | INTRAVENOUS | Status: AC
  Administered 2014-04-20: 1500 mg via INTRAVENOUS
  Filled 2014-04-19 (×2): qty 1500

## 2014-04-19 MED ORDER — CHLORHEXIDINE GLUCONATE CLOTH 2 % EX PADS: 6.0000 | MEDICATED_PAD | Freq: Once | CUTANEOUS | Status: DC

## 2014-04-19 MED ORDER — SODIUM CHLORIDE 0.9 % IV SOLN: INTRAVENOUS | Status: DC

## 2014-04-20 ENCOUNTER — Inpatient Hospital Stay (HOSPITAL_COMMUNITY): Payer: Commercial Managed Care - HMO | Admitting: Anesthesiology

## 2014-04-20 ENCOUNTER — Inpatient Hospital Stay (HOSPITAL_COMMUNITY)
Admission: RE | Admit: 2014-04-20 | Discharge: 2014-04-22 | DRG: 464 | Disposition: A | Discharge status: Home / Self Care | Payer: Commercial Managed Care - HMO | Source: Ambulatory Visit | Attending: Vascular Surgery | Admitting: Vascular Surgery

## 2014-04-20 ENCOUNTER — Encounter (HOSPITAL_COMMUNITY)
Admission: RE | Disposition: A | Discharge status: Home / Self Care | Payer: Self-pay | Source: Ambulatory Visit | Attending: Vascular Surgery

## 2014-04-20 ENCOUNTER — Encounter (HOSPITAL_COMMUNITY): Payer: Self-pay | Admitting: *Deleted

## 2014-04-20 DIAGNOSIS — Z899 Acquired absence of limb, unspecified: Secondary | ICD-10-CM | POA: Diagnosis not present

## 2014-04-20 DIAGNOSIS — T8740 Infection of amputation stump, unspecified extremity: Secondary | ICD-10-CM | POA: Diagnosis present

## 2014-04-20 DIAGNOSIS — E119 Type 2 diabetes mellitus without complications: Secondary | ICD-10-CM | POA: Diagnosis present

## 2014-04-20 DIAGNOSIS — I739 Peripheral vascular disease, unspecified: Secondary | ICD-10-CM | POA: Diagnosis not present

## 2014-04-20 DIAGNOSIS — Z86718 Personal history of other venous thrombosis and embolism: Secondary | ICD-10-CM

## 2014-04-20 DIAGNOSIS — I5022 Chronic systolic (congestive) heart failure: Secondary | ICD-10-CM | POA: Diagnosis present

## 2014-04-20 DIAGNOSIS — Y835 Amputation of limb(s) as the cause of abnormal reaction of the patient, or of later complication, without mention of misadventure at the time of the procedure: Secondary | ICD-10-CM | POA: Diagnosis present

## 2014-04-20 DIAGNOSIS — E785 Hyperlipidemia, unspecified: Secondary | ICD-10-CM | POA: Diagnosis not present

## 2014-04-20 DIAGNOSIS — T8789 Other complications of amputation stump: Principal | ICD-10-CM | POA: Diagnosis present

## 2014-04-20 DIAGNOSIS — I252 Old myocardial infarction: Secondary | ICD-10-CM | POA: Diagnosis not present

## 2014-04-20 DIAGNOSIS — N189 Chronic kidney disease, unspecified: Secondary | ICD-10-CM | POA: Diagnosis present

## 2014-04-20 DIAGNOSIS — T879 Unspecified complications of amputation stump: Secondary | ICD-10-CM

## 2014-04-20 DIAGNOSIS — H269 Unspecified cataract: Secondary | ICD-10-CM | POA: Diagnosis present

## 2014-04-20 DIAGNOSIS — I2510 Atherosclerotic heart disease of native coronary artery without angina pectoris: Secondary | ICD-10-CM | POA: Diagnosis present

## 2014-04-20 DIAGNOSIS — I480 Paroxysmal atrial fibrillation: Secondary | ICD-10-CM | POA: Diagnosis not present

## 2014-04-20 HISTORY — DX: Acute embolism and thrombosis of unspecified deep veins of unspecified lower extremity: I82.409

## 2014-04-20 HISTORY — PX: APPLICATION OF WOUND VAC: SHX5189

## 2014-04-20 HISTORY — PX: I & D EXTREMITY: SHX5045

## 2014-04-20 HISTORY — PX: WOUND DEBRIDEMENT: SHX247

## 2014-04-20 HISTORY — DX: Reserved for inherently not codable concepts without codable children: IMO0001

## 2014-04-20 LAB — GLUCOSE, CAPILLARY
GLUCOSE-CAPILLARY: 146 mg/dL — AB (ref 70–99)
GLUCOSE-CAPILLARY: 117 mg/dL — AB (ref 70–99)
Glucose-Capillary: 152 mg/dL — ABNORMAL HIGH (ref 70–99)
Glucose-Capillary: 150 mg/dL — ABNORMAL HIGH (ref 70–99)
Glucose-Capillary: 139 mg/dL — ABNORMAL HIGH (ref 70–99)

## 2014-04-20 LAB — CBC
HEMATOCRIT: 43.9 % (ref 39.0–52.0)
HEMOGLOBIN: 15.3 g/dL (ref 13.0–17.0)
MCH: 30.5 pg (ref 26.0–34.0)
MCHC: 34.9 g/dL (ref 30.0–36.0)
MCV: 87.5 fL (ref 78.0–100.0)
PLATELETS: 153 10*3/uL (ref 150–400)
RBC: 5.02 MIL/uL (ref 4.22–5.81)
RDW: 13 % (ref 11.5–15.5)
WBC: 5.9 10*3/uL (ref 4.0–10.5)

## 2014-04-20 LAB — COMPREHENSIVE METABOLIC PANEL
ALT: 13 U/L (ref 0–53)
ANION GAP: 7 (ref 5–15)
AST: 15 U/L (ref 0–37)
Albumin: 3.3 g/dL — ABNORMAL LOW (ref 3.5–5.2)
Alkaline Phosphatase: 91 U/L (ref 39–117)
BUN: 13 mg/dL (ref 6–23)
CALCIUM: 8.7 mg/dL (ref 8.4–10.5)
CO2: 24 mmol/L (ref 19–32)
CREATININE: 1.13 mg/dL (ref 0.50–1.35)
Chloride: 103 mEq/L (ref 96–112)
GFR, EST AFRICAN AMERICAN: 76 mL/min — AB (ref >90)
GFR, EST NON AFRICAN AMERICAN: 65 mL/min — AB (ref >90)
GLUCOSE: 173 mg/dL — AB (ref 70–99)
Potassium: 3.8 mmol/L (ref 3.5–5.1)
Sodium: 134 mmol/L — ABNORMAL LOW (ref 135–145)
Total Bilirubin: 0.7 mg/dL (ref 0.3–1.2)
Total Protein: 6.4 g/dL (ref 6.0–8.3)

## 2014-04-20 SURGERY — IRRIGATION AND DEBRIDEMENT EXTREMITY
Anesthesia: General | Site: Leg Lower | Laterality: Right

## 2014-04-20 MED ORDER — EZETIMIBE 10 MG PO TABS
10.0000 mg | ORAL_TABLET | Freq: Every day | ORAL | Status: DC
  Administered 2014-04-21 – 2014-04-22 (×2): 10 mg via ORAL
  Filled 2014-04-20 (×2): qty 1

## 2014-04-20 MED ORDER — MORPHINE SULFATE 2 MG/ML IJ SOLN: 2.0000 mg | INTRAMUSCULAR | Status: DC | PRN

## 2014-04-20 MED ORDER — LABETALOL HCL 5 MG/ML IV SOLN
10.0000 mg | INTRAVENOUS | Status: DC | PRN
  Filled 2014-04-20: qty 4

## 2014-04-20 MED ORDER — METOPROLOL SUCCINATE ER 50 MG PO TB24
50.0000 mg | ORAL_TABLET | Freq: Two times a day (BID) | ORAL | Status: DC
  Administered 2014-04-20 – 2014-04-22 (×4): 50 mg via ORAL
  Filled 2014-04-20 (×5): qty 1

## 2014-04-20 MED ORDER — HYDRALAZINE HCL 20 MG/ML IJ SOLN: 5.0000 mg | INTRAMUSCULAR | Status: DC | PRN

## 2014-04-20 MED ORDER — LISINOPRIL 5 MG PO TABS
5.0000 mg | ORAL_TABLET | Freq: Every day | ORAL | Status: DC
Start: 2014-04-20 — End: 2014-04-22
  Administered 2014-04-20 – 2014-04-22 (×3): 5 mg via ORAL
  Filled 2014-04-20 (×3): qty 1

## 2014-04-20 MED ORDER — METFORMIN HCL 500 MG PO TABS
500.0000 mg | ORAL_TABLET | Freq: Two times a day (BID) | ORAL | Status: DC
  Administered 2014-04-20 – 2014-04-22 (×4): 500 mg via ORAL
  Filled 2014-04-20 (×6): qty 1

## 2014-04-20 MED ORDER — NITROGLYCERIN 0.4 MG SL SUBL: 0.4000 mg | SUBLINGUAL_TABLET | SUBLINGUAL | Status: DC | PRN

## 2014-04-20 MED ORDER — INSULIN ASPART 100 UNIT/ML ~~LOC~~ SOLN
5.0000 [IU] | Freq: Three times a day (TID) | SUBCUTANEOUS | Status: DC
  Administered 2014-04-21 (×3): 5 [IU] via SUBCUTANEOUS

## 2014-04-20 MED ORDER — ONDANSETRON HCL 4 MG/2ML IJ SOLN: 4.0000 mg | Freq: Four times a day (QID) | INTRAMUSCULAR | Status: DC | PRN

## 2014-04-20 MED ORDER — ACETAMINOPHEN 160 MG/5ML PO SOLN
325.0000 mg | ORAL | Status: DC | PRN
  Filled 2014-04-20: qty 20.3

## 2014-04-20 MED ORDER — POTASSIUM CHLORIDE CRYS ER 20 MEQ PO TBCR: 20.0000 meq | EXTENDED_RELEASE_TABLET | Freq: Every day | ORAL | Status: DC | PRN

## 2014-04-20 MED ORDER — GLIMEPIRIDE 4 MG PO TABS
8.0000 mg | ORAL_TABLET | Freq: Every day | ORAL | Status: DC
  Administered 2014-04-21 – 2014-04-22 (×2): 8 mg via ORAL
  Filled 2014-04-20 (×3): qty 2

## 2014-04-20 MED ORDER — VANCOMYCIN HCL IN DEXTROSE 1-5 GM/200ML-% IV SOLN
1000.0000 mg | Freq: Two times a day (BID) | INTRAVENOUS | Status: AC
Start: 2014-04-20 — End: 2014-04-21
  Administered 2014-04-20 – 2014-04-21 (×2): 1000 mg via INTRAVENOUS
  Filled 2014-04-20 (×2): qty 200

## 2014-04-20 MED ORDER — DOCUSATE SODIUM 100 MG PO CAPS
100.0000 mg | ORAL_CAPSULE | Freq: Every day | ORAL | Status: DC
  Administered 2014-04-21 – 2014-04-22 (×2): 100 mg via ORAL
  Filled 2014-04-20 (×2): qty 1

## 2014-04-20 MED ORDER — OXYCODONE HCL 5 MG/5ML PO SOLN: 5.0000 mg | Freq: Once | ORAL | Status: DC | PRN

## 2014-04-20 MED ORDER — ALUM & MAG HYDROXIDE-SIMETH 200-200-20 MG/5ML PO SUSP: 15.0000 mL | ORAL | Status: DC | PRN

## 2014-04-20 MED ORDER — PROPOFOL 10 MG/ML IV BOLUS
INTRAVENOUS | Status: DC | PRN
  Administered 2014-04-20: 140 mg via INTRAVENOUS

## 2014-04-20 MED ORDER — PANTOPRAZOLE SODIUM 40 MG PO TBEC
40.0000 mg | DELAYED_RELEASE_TABLET | Freq: Every day | ORAL | Status: DC
  Administered 2014-04-20 – 2014-04-22 (×3): 40 mg via ORAL
  Filled 2014-04-20 (×3): qty 1

## 2014-04-20 MED ORDER — 0.9 % SODIUM CHLORIDE (POUR BTL) OPTIME
TOPICAL | Status: DC | PRN
  Administered 2014-04-20: 1000 mL

## 2014-04-20 MED ORDER — SODIUM CHLORIDE 0.9 % IV SOLN
INTRAVENOUS | Status: DC
  Administered 2014-04-20: 15:00:00 via INTRAVENOUS

## 2014-04-20 MED ORDER — ACETAMINOPHEN 325 MG PO TABS: 325.0000 mg | ORAL_TABLET | ORAL | Status: DC | PRN

## 2014-04-20 MED ORDER — PHENOL 1.4 % MT LIQD
1.0000 | OROMUCOSAL | Status: DC | PRN
  Filled 2014-04-20: qty 177

## 2014-04-20 MED ORDER — FENTANYL CITRATE 0.05 MG/ML IJ SOLN
INTRAMUSCULAR | Status: DC | PRN
  Administered 2014-04-20: 50 ug via INTRAVENOUS

## 2014-04-20 MED ORDER — ACETAMINOPHEN 325 MG PO TABS: 650.0000 mg | ORAL_TABLET | Freq: Four times a day (QID) | ORAL | Status: DC | PRN

## 2014-04-20 MED ORDER — LIDOCAINE HCL (PF) 2 % IJ SOLN
INTRAMUSCULAR | Status: DC | PRN
  Administered 2014-04-20: 70 mg via INTRADERMAL

## 2014-04-20 MED ORDER — METOPROLOL TARTRATE 1 MG/ML IV SOLN: 2.0000 mg | INTRAVENOUS | Status: DC | PRN

## 2014-04-20 MED ORDER — FENTANYL CITRATE 0.05 MG/ML IJ SOLN
25.0000 ug | INTRAMUSCULAR | Status: DC | PRN
  Administered 2014-04-20: 50 ug via INTRAVENOUS

## 2014-04-20 MED ORDER — FENTANYL CITRATE 0.05 MG/ML IJ SOLN
INTRAMUSCULAR | Status: AC
  Filled 2014-04-20: qty 2

## 2014-04-20 MED ORDER — BISACODYL 10 MG RE SUPP: 10.0000 mg | Freq: Every day | RECTAL | Status: DC | PRN

## 2014-04-20 MED ORDER — INSULIN ASPART 100 UNIT/ML ~~LOC~~ SOLN: 0.0000 [IU] | Freq: Three times a day (TID) | SUBCUTANEOUS | Status: DC

## 2014-04-20 MED ORDER — RIVAROXABAN 20 MG PO TABS
20.0000 mg | ORAL_TABLET | Freq: Every day | ORAL | Status: DC
  Administered 2014-04-21 – 2014-04-22 (×2): 20 mg via ORAL
  Filled 2014-04-20 (×2): qty 1

## 2014-04-20 MED ORDER — INSULIN GLARGINE 100 UNIT/ML ~~LOC~~ SOLN
45.0000 [IU] | Freq: Two times a day (BID) | SUBCUTANEOUS | Status: DC
  Administered 2014-04-20 – 2014-04-22 (×4): 45 [IU] via SUBCUTANEOUS
  Filled 2014-04-20 (×5): qty 0.45

## 2014-04-20 MED ORDER — GUAIFENESIN-DM 100-10 MG/5ML PO SYRP: 15.0000 mL | ORAL_SOLUTION | ORAL | Status: DC | PRN

## 2014-04-20 MED ORDER — OXYCODONE-ACETAMINOPHEN 5-325 MG PO TABS: 1.0000 | ORAL_TABLET | ORAL | Status: DC | PRN

## 2014-04-20 MED ORDER — OXYCODONE HCL 5 MG PO TABS: 5.0000 mg | ORAL_TABLET | Freq: Once | ORAL | Status: DC | PRN

## 2014-04-20 MED ORDER — LACTATED RINGERS IV SOLN
INTRAVENOUS | Status: DC
  Administered 2014-04-20: 12:00:00 via INTRAVENOUS

## 2014-04-20 SURGICAL SUPPLY — 39 items
BANDAGE ELASTIC 4 VELCRO ST LF (GAUZE/BANDAGES/DRESSINGS) IMPLANT
BANDAGE ELASTIC 6 VELCRO ST LF (GAUZE/BANDAGES/DRESSINGS) IMPLANT
BNDG GAUZE ELAST 4 BULKY (GAUZE/BANDAGES/DRESSINGS) IMPLANT
CANISTER SUCTION 2500CC (MISCELLANEOUS) ×2 IMPLANT
CANISTER WOUND CARE 500ML ATS (WOUND CARE) ×1 IMPLANT
COVER SURGICAL LIGHT HANDLE (MISCELLANEOUS) ×2 IMPLANT
DRAPE INCISE IOBAN 66X45 STRL (DRAPES) IMPLANT
DRAPE ORTHO SPLIT 77X108 STRL (DRAPES) ×2
DRAPE PROXIMA HALF (DRAPES) ×2 IMPLANT
DRAPE SURG ORHT 6 SPLT 77X108 (DRAPES) IMPLANT
DRSG VAC ATS SM SENSATRAC (GAUZE/BANDAGES/DRESSINGS) ×1 IMPLANT
ELECT REM PT RETURN 9FT ADLT (ELECTROSURGICAL) ×2
ELECTRODE REM PT RTRN 9FT ADLT (ELECTROSURGICAL) ×1 IMPLANT
GAUZE SPONGE 4X4 12PLY STRL (GAUZE/BANDAGES/DRESSINGS) ×2 IMPLANT
GLOVE BIO SURGEON STRL SZ7 (GLOVE) ×3 IMPLANT
GLOVE BIOGEL PI IND STRL 6.5 (GLOVE) IMPLANT
GLOVE BIOGEL PI IND STRL 7.5 (GLOVE) ×1 IMPLANT
GLOVE BIOGEL PI IND STRL 8 (GLOVE) IMPLANT
GLOVE BIOGEL PI INDICATOR 6.5 (GLOVE) ×2
GLOVE BIOGEL PI INDICATOR 7.5 (GLOVE) ×1
GLOVE BIOGEL PI INDICATOR 8 (GLOVE) ×1
GLOVE SURG SS PI 7.0 STRL IVOR (GLOVE) ×1 IMPLANT
GLOVE SURG SS PI 7.5 STRL IVOR (GLOVE) ×1 IMPLANT
GOWN STRL REUS W/ TWL LRG LVL3 (GOWN DISPOSABLE) ×3 IMPLANT
GOWN STRL REUS W/ TWL XL LVL3 (GOWN DISPOSABLE) IMPLANT
GOWN STRL REUS W/TWL LRG LVL3 (GOWN DISPOSABLE) ×6
GOWN STRL REUS W/TWL XL LVL3 (GOWN DISPOSABLE) ×2
KIT BASIN OR (CUSTOM PROCEDURE TRAY) ×2 IMPLANT
KIT ROOM TURNOVER OR (KITS) ×2 IMPLANT
NS IRRIG 1000ML POUR BTL (IV SOLUTION) ×2 IMPLANT
PACK CV ACCESS (CUSTOM PROCEDURE TRAY) IMPLANT
PACK GENERAL/GYN (CUSTOM PROCEDURE TRAY) ×1 IMPLANT
PAD ARMBOARD 7.5X6 YLW CONV (MISCELLANEOUS) ×4 IMPLANT
SUT ETHILON 3 0 PS 1 (SUTURE) IMPLANT
SUT MNCRL AB 4-0 PS2 18 (SUTURE) IMPLANT
SUT VIC AB 2-0 CTX 36 (SUTURE) IMPLANT
SUT VIC AB 3-0 SH 27 (SUTURE)
SUT VIC AB 3-0 SH 27X BRD (SUTURE) IMPLANT
WATER STERILE IRR 1000ML POUR (IV SOLUTION) ×2 IMPLANT

## 2014-04-20 NOTE — Plan of Care (Signed)
Problem: Phase I Progression Outcomes Goal: Incision/dressings dry and intact Outcome: Completed/Met Date Met:  04/20/14 Wound vac at 125

## 2014-04-20 NOTE — H&P (View-Only) (Signed)
Established Critical Limb Ischemia Patient  History of Present Illness  Leroy [name] is a 69 y.o. (March 16, 1946) male who presents with chief complaint: drainage R AKA.  This patient is s/p B BKA.  R BKA was done 04/14/13.  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  He is draining pus from that sinus today.  He denies fever or chills.  He is under the wound care at [facility]    Past Medical History  Diagnosis Date  . Hyperlipidemia   . Ischemic cardiomyopathy     a. EF 40% 2010. b. Echo (2/14):  mild LVH, EF 30-35%, diff HK, Gr 1 DD, MAC, mild MR, mod LAE, PASP 39  . Cataract   . Hypotension   . Paroxysmal atrial fibrillation   . Peripheral vascular disease     a. s/p L BKA 2014.  [name] History of blood clots     in legs--this was in 2011--has been off of Coumadin 52month;takes Xarelto daily  . Peripheral neuropathy   . Joint pain   . H/O hiatal hernia   . Chronic kidney disease     on Lisinopril to protect kidneys d/t being on Metformin per pt  . Myocardial infarction     total of 8 heart attacks;last one in 2011  . Atrial flutter   . H/O medication noncompliance     Due to insurance issues  . Chronic systolic CHF (congestive heart failure)   . Coronary artery disease     a. s/p remote CABG 2001 at [facility] b. Cath 03/2010: for med rx.;  c.  [facility] Adammyoview (1/14):  Large Inferior apical MI, very small area of peri-infarct ischemia toward the inferior apical segment. EF 19%.  Med Rx was continued.    . Diabetes mellitus 1979    takes Metformni daily  and Lantus 50units bid    Past Surgical History  Procedure Laterality Date  . Revasculariztion  2011/2010    x 2   . Coronary artery bypass graft  03/2000    quadruple  . Tonsillectomy    . Hernia repair    . Cardiac catheterization  03/19/10  . Abdominal aortagram  04-30-12  . Adenoidectomy    . Amputation Left 06/03/2012    Procedure: AMPUTATION BELOW KNEE;  Surgeon: BConrad Marlboro MD;  [place]  Service: Vascular;  Laterality: Left;  . Coronary angioplasty  2002    3 stents  . Endarterectomy femoral Right 03/25/2013    Procedure: ENDARTERECTOMY FEMORAL ;  Surgeon: BConrad Loon Lake MD;  [place]  Service: Vascular;  Laterality: Right;  . Patch angioplasty Right 03/25/2013    Procedure: PATCH ANGIOPLASTY USING VASCU-GUARD PERIPHERAL VASCULAR PATCH;  Surgeon: BConrad Petaluma MD;  [place]  Service: Vascular;  Laterality: Right;  . Amputation Right 04/14/2013    Procedure: AMPUTATION BELOW KNEE ;  Surgeon: BConrad Ceres MD;  [place]  Service: Vascular;  Laterality: Right;  . I&d extremity Right 04/14/2013    Procedure: IRRIGATION AND DEBRIDEMENT OF RIGHT  GROIN & PLACEMENT OF VAC DRESSING;  Surgeon: BConrad Leonville MD;  [place]  Service: Vascular;  Laterality: Right;  . Abdominal aortagram N/A 04/30/2012    Procedure: ABDOMINAL AMaxcine Ham  Surgeon: BConrad [place]ersville MD;  [facility];  Service: Cardiovascular;  Laterality: N/A;  . Lower extremity angiogram Bilateral 04/30/2012  Procedure: LOWER EXTREMITY ANGIOGRAM;  Surgeon: Conrad Apple Valley, MD;  Location: [facility] CATH LAB;  Service: Cardiovascular;  Laterality: Bilateral;  bilat lower extrem angio    History   Social History  . Marital Status: Legally Separated    Spouse Name: N/A    Number of Children: N/A  . Years of Education: N/A   Occupational History  . Not on file.   Social History Main Topics  . Smoking status: Former Smoker    Types: Cigarettes  . Smokeless tobacco: Never Used     Comment: quit smoking 52yr ago  . Alcohol Use: No     Comment: occasionally  . Drug Use: No  . Sexual Activity: Not Currently   Other Topics Concern  . Not on file   Social History Narrative    Family History  Problem Relation Age of Onset  . Heart disease Mother     before age 68 . Diabetes Mother   . Heart attack Mother   . Heart disease Father     Current Outpatient  Prescriptions on File Prior to Visit  Medication Sig Dispense Refill  . acetaminophen (TYLENOL) 325 MG tablet Take 2 tablets (650 mg total) by mouth every 6 (six) hours as needed for mild pain or fever. 30 tablet 0  . Alcohol Swabs (B-D SINGLE USE SWABS REGULAR) PADS     . Alcohol Swabs (B-D SINGLE USE SWABS REGULAR) PADS Test up to TID dx: 250.62 300 each 4  . Blood Glucose Calibration (ACCU-CHEK SMARTVIEW CONTROL) LIQD Test up to TID dx: 250.62 1 each 3  . Blood Glucose Monitoring Suppl (ACCU-CHEK NANO SMARTVIEW) W/DEVICE KIT Test up to TID dx: 250.62 1 kit 1  . collagenase (SANTYL) ointment Apply 1 application topically daily. 30 g 1  . ezetimibe (ZETIA) 10 MG tablet Take 1 tablet (10 mg total) by mouth daily. 30 tablet 1  . glimepiride (AMARYL) 4 MG tablet Take 2 tablets (8 mg total) by mouth daily with breakfast. 180 tablet 3  . glucose blood (ACCU-CHEK SMARTVIEW) test strip Test up to TID dx: 250.62 300 each 4  . insulin aspart (NOVOLOG) 100 UNIT/ML injection Inject 5 Units into the skin 3 (three) times daily with meals. 10 mL 11  . insulin glargine (LANTUS) 100 UNIT/ML injection Inject 0.45 mLs (45 Units total) into the skin 2 (two) times daily. 10 mL 11  . Lancets Misc. (ACCU-CHEK FASTCLIX LANCET) KIT Test up to TID dx: 250.62 1 kit 3  . lisinopril (PRINIVIL,ZESTRIL) 5 MG tablet Take 1 tablet (5 mg total) by mouth daily. 30 tablet 11  . metFORMIN (GLUCOPHAGE) 500 MG tablet Take 1 tablet (500 mg total) by mouth 2 (two) times daily with a meal. 60 tablet 11  . metoprolol succinate (TOPROL-XL) 50 MG 24 hr tablet Take 1 tablet (50 mg total) by mouth 2 (two) times daily. Take with or immediately following a meal. 60 tablet 0  . nitroGLYCERIN (NITROSTAT) 0.4 MG SL tablet Place 1 tablet (0.4 mg total) under the tongue every 5 (five) minutes as needed for chest pain. 30 tablet 12  . pantoprazole (PROTONIX) 40 MG tablet Take 1 tablet (40 mg total) by mouth daily. 30 tablet 5  . PRODIGY NO CODING  BLOOD GLUC test strip     . rivaroxaban (XARELTO) 20 MG TABS tablet Take 1 tablet (20 mg total) by mouth daily at 6 PM. 30 tablet 5  . silver sulfADIAZINE (SILVADENE) 1 % cream Apply 1 application topically daily.  50 g 0  . terbinafine (LAMISIL) 250 MG tablet      No current facility-administered medications on file prior to visit.    Allergies  Allergen Reactions  . Statins     Muscle aches  . Amlodipine Besylate     Muscle aches making difficult to walk  . Cephalexin     REACTION: Hives    REVIEW OF SYSTEMS:  (Positives checked otherwise negative)  CARDIOVASCULAR:  _0  chest pain, _1  chest pressure, _2  palpitations, _3  shortness of breath when laying flat, _4  shortness of breath with exertion,  _5  pain in feet when walking, _6  pain in feet when laying flat, _7  history of blood clot in veins (DVT), _8  history of phlebitis, _9  swelling in legs, _10  varicose veins  PULMONARY:  _11  productive cough, _12  asthma, _13  wheezing  NEUROLOGIC:  _14  weakness in arms or legs, _15  numbness in arms or legs, _16  difficulty speaking or slurred speech, _17  temporary loss of vision in one eye, _18  dizziness  HEMATOLOGIC:  _19  bleeding problems, _20  problems with blood clotting too easily  MUSCULOSKEL:  _21  joint pain, _22  joint swelling  GASTROINTEST:  _23  vomiting blood, _24  blood in stool     GENITOURINARY:  _25  burning with urination, _26  blood in urine  PSYCHIATRIC:  _27  history of major depression  INTEGUMENTARY:  _28  rashes, _29  ulcers     Physical Examination  Filed Vitals:   04/15/14 1413  BP: 124/67  Pulse: 88  Height: _30  (1.753 m)  Weight: 292 lb (132.45 kg)  SpO2: 96%   Body mass index is 43.1 kg/(m^2).  General: A&O x 3, WDWN  Eyes: PERRLA, EOMI  Pulmonary: Sym exp, good air movt, CTAB, no rales, rhonchi, & wheezing  Cardiac: RRR, Nl S1, S2, no Murmurs, rubs or gallops  Vascular: Vessel Right Left  Radial Palpable Palpable  Brachial Palpable **Palpable  Carotid Palpable,  without bruit Palpable, without bruit  Aorta Not palpable N/A  Femoral Palpable Palpable  Popliteal Not palpable Not palpable  PT BKA BKA  DP BKA BKA   Gastrointestinal: soft, NTND, -G/R, - HSM, - masses, - CVAT B  Musculoskeletal: M/S 5/5 throughout , L BKA healed, prosthesis on, R BKA with chronic sinus with frank pus draining, debris extract with probing with CTA, R BKA is cool  Neurologic: Pain and light touch intact in extremities , Motor exam as listed above   Medical Decision Making  NYSIR FERGUSSON is a 69 y.o. male who presents with: s/p B BKA   I suspect some degree of compromise blood flow to the R BKA as the etiology for his persistent poor healing.   As a salvage maneuver, I rec: R BKA debridement and VAC placement.  If this fails, the patient will need to consider a R AKA.  Thank you for allowing [place] to participate in this patient's care.  Adele Barthel, MD [facility] [place] Office: [phone] Pager: [phone]  04/15/2014, 2:46 PM

## 2014-04-20 NOTE — Interval H&P Note (Signed)
Addendum: The procedure is: right BELOW-the-knee amputation stump debridement, VAC placement for chronic abscess in R BKA stump  [name], MD [facility] [place] Office: [phone] Pager: [phone]  04/20/2014, 12:12 PM

## 2014-04-20 NOTE — Interval H&P Note (Signed)
History and Physical Interval Note:  04/20/2014 10:32 AM  Leroy [name]  has presented today for surgery, with the diagnosis of Wound to right above knee amputation stump Z89.9  The various methods of treatment have been discussed with the patient and family. After consideration of risks, benefits and other options for treatment, the patient has consented to  Procedure(s): DEBRIDEMENT OF ABOVE KNEE AMPUTATION (Right) APPLICATION OF WOUND VAC (Right) as a surgical intervention .  The patient's history has been reviewed, patient examined, no change in status, stable for surgery.  I have reviewed the patient's chart and labs.  Questions were answered to the patient's satisfaction.     Megan Presti LIANG-YU

## 2014-04-20 NOTE — Transfer of Care (Signed)
Immediate Anesthesia Transfer of Care Note  Patient: Leroy [name]  Procedure(s) Performed: Procedure(s): DEBRIDEMENT OF RIGHT BELOW KNEE AMPUTATION STUMP (Right) APPLICATION OF WOUND VAC (Right)  Patient Location: PACU  Anesthesia Type:General  Level of Consciousness: awake, alert  and oriented  Airway & Oxygen Therapy: Patient Spontanous Breathing and Patient connected to nasal cannula oxygen  Post-op Assessment: Report given to PACU RN and Post -op Vital signs reviewed and stable  Post vital signs: Reviewed and stable  Complications: No apparent anesthesia complications

## 2014-04-20 NOTE — Anesthesia Preprocedure Evaluation (Signed)
Anesthesia Evaluation  Patient identified by MRN, date of birth, ID band Patient awake    Reviewed: Allergy & Precautions, NPO status , Patient's Chart, lab work & pertinent test results  History of Anesthesia Complications Negative for: history of anesthetic complications  Airway Mallampati: II  TM Distance: >3 FB Neck ROM: Full    Dental  (+) Edentulous Upper, Edentulous Lower   Pulmonary shortness of breath, neg sleep apnea, neg COPDneg recent URI, former smoker,  breath sounds clear to auscultation        Cardiovascular hypertension, Pt. on medications and Pt. on home beta blockers - angina+ CAD, + Past MI, + Cardiac Stents, + Peripheral Vascular Disease and +CHF Rhythm:Regular     Neuro/Psych  Neuromuscular disease negative psych ROS   GI/Hepatic Neg liver ROS, hiatal hernia, GERD-  Medicated and Controlled,  Endo/Other  diabetesMorbid obesity  Renal/GU Renal InsufficiencyRenal disease     Musculoskeletal   Abdominal   Peds  Hematology negative hematology ROS (+)   Anesthesia Other Findings   Reproductive/Obstetrics                             Anesthesia Physical Anesthesia Plan  ASA: III  Anesthesia Plan: General   Post-op Pain Management:    Induction: Intravenous  Airway Management Planned: LMA  Additional Equipment: None  Intra-op Plan:   Post-operative Plan: Extubation in OR  Informed Consent: I have reviewed the patients History and Physical, chart, labs and discussed the procedure including the risks, benefits and alternatives for the proposed anesthesia with the patient or authorized representative who has indicated his/her understanding and acceptance.   Dental advisory given  Plan Discussed with: CRNA and Surgeon  Anesthesia Plan Comments:         Anesthesia Quick Evaluation

## 2014-04-20 NOTE — Progress Notes (Signed)
PROSTHETIC LEG TAKEN OVER TO PACU AND SIGNED IN.

## 2014-04-20 NOTE — Anesthesia Postprocedure Evaluation (Signed)
  Anesthesia Post-op Note  Patient: Leroy [name]  Procedure(s) Performed: Procedure(s): DEBRIDEMENT OF RIGHT BELOW KNEE AMPUTATION STUMP (Right) APPLICATION OF WOUND VAC (Right)  Patient Location: PACU  Anesthesia Type:General  Level of Consciousness: awake and alert   Airway and Oxygen Therapy: Patient Spontanous Breathing  Post-op Pain: mild  Post-op Assessment: Post-op Vital signs reviewed, Patient's Cardiovascular Status Stable, Respiratory Function Stable, Patent Airway, No signs of Nausea or vomiting and Pain level controlled  Post-op Vital Signs: Reviewed and stable  Last Vitals:  Filed Vitals:   04/20/14 1438  BP: 155/80  Pulse: 69  Temp: 36.4 C  Resp: 18    Complications: No apparent anesthesia complications

## 2014-04-20 NOTE — Op Note (Signed)
    OPERATIVE NOTE   PROCEDURE: 1. Debridement of right below-knee amputation stump 2. Placement of negative pressure dressing  PRE-OPERATIVE DIAGNOSIS: Non-healing right below-knee amputation stump   POST-OPERATIVE DIAGNOSIS: same as above   SURGEON: [name], MD  ANESTHESIA: general  ESTIMATED BLOOD LOSS: 50 cc  FINDING(S): 1.  Minimal bleeding below skin level 2.  Chronic necrotic tissue at base of sinus  SPECIMEN(S):  none  INDICATIONS:   Leroy [name] is a 69 y.o. 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  I discussed with the patient his options of debridement of the right below-knee amputation vs proceeding with a right above-knee amputation.  He elected the debridement.  I discussed in depth the nature of the procedure with the patient, including risks, benefits, and alternatives.  The patient is aware that the risks include but are not limited to: bleeding, infection, myocardial infarction, stroke, death, failure to heal amputation wound, and possible need for more proximal amputation.  The patient is aware of the risks and agrees proceed forward with the procedure. .  DESCRIPTION: After obtaining full informed written consent, the patient was brought back to the operating room and placed supine upon the operating table.  The patient received IV antibiotics prior to induction.  After obtaining adequate anesthesia, the patient was prepped and draped in the standard fashion for: right below-knee amputation stump debridement.  I made an elliptical incision around the chronic sinus with a 15 blade.  There was good bleeding from the skin edge.  I then dissected through the subcutaneous tissue with electrocautery until I was deep to the sinus tract.  Leroy [name] was a small amount of frankly necrotic tissue within the base of the sinus but none deep to its.  There appeared to be scar tissue throughout this dissection plane  without any active bleeding.  I then washed out this wound with 100 cc of sterile normal saline.  The right below-knee amputation stump was dried.  I fashioned a segment of small VAC sponge for this wound.  It was packed into the wound.  I affixed this sponge with small strips of the adhesive dressing.  I made a hole over the sponge and than affixed the lilypad.  The tubing was attached to the VAC pump.  The dressing became adherent with 125 continuous suction.  The rest of the stump was cleaned and dried.  COMPLICATIONS: None  CONDITION: stable   [name], MD [facility] [place] Office: [phone] Pager: [phone]  04/20/2014, 1:19 PM

## 2014-04-20 NOTE — Anesthesia Procedure Notes (Signed)
Procedure Name: LMA Insertion Date/Time: 04/20/2014 12:57 PM Performed by: Marena Chancy Pre-anesthesia Checklist: Patient identified, Patient being monitored, Emergency Drugs available, Suction available and Timeout performed Patient Re-evaluated:Patient Re-evaluated prior to inductionOxygen Delivery Method: Circle system utilized Preoxygenation: Pre-oxygenation with 100% oxygen Intubation Type: IV induction LMA: LMA inserted LMA Size: 5.0 Number of attempts: 1 Placement Confirmation: breath sounds checked- equal and bilateral and positive ETCO2 Tube secured with: Tape Dental Injury: Teeth and Oropharynx as per pre-operative assessment

## 2014-04-21 ENCOUNTER — Telehealth: Payer: Self-pay | Admitting: Vascular Surgery

## 2014-04-21 LAB — GLUCOSE, CAPILLARY
GLUCOSE-CAPILLARY: 100 mg/dL — AB (ref 70–99)
GLUCOSE-CAPILLARY: 97 mg/dL (ref 70–99)
GLUCOSE-CAPILLARY: 66 mg/dL — AB (ref 70–99)
GLUCOSE-CAPILLARY: 79 mg/dL (ref 70–99)
Glucose-Capillary: 86 mg/dL (ref 70–99)

## 2014-04-21 LAB — BASIC METABOLIC PANEL
Anion gap: 11 (ref 5–15)
BUN: 9 mg/dL (ref 6–23)
CALCIUM: 8.7 mg/dL (ref 8.4–10.5)
CO2: 27 mmol/L (ref 19–32)
Chloride: 103 mEq/L (ref 96–112)
Creatinine, Ser: 1.05 mg/dL (ref 0.50–1.35)
GFR calc non Af Amer: 71 mL/min — ABNORMAL LOW (ref >90)
GFR, EST AFRICAN AMERICAN: 83 mL/min — AB (ref >90)
Glucose, Bld: 119 mg/dL — ABNORMAL HIGH (ref 70–99)
Potassium: 3.8 mmol/L (ref 3.5–5.1)
SODIUM: 141 mmol/L (ref 135–145)

## 2014-04-21 LAB — CBC
HEMATOCRIT: 42.9 % (ref 39.0–52.0)
HEMOGLOBIN: 14.3 g/dL (ref 13.0–17.0)
MCH: 30.2 pg (ref 26.0–34.0)
MCHC: 33.3 g/dL (ref 30.0–36.0)
MCV: 90.7 fL (ref 78.0–100.0)
PLATELETS: 152 10*3/uL (ref 150–400)
RBC: 4.73 MIL/uL (ref 4.22–5.81)
RDW: 12.9 % (ref 11.5–15.5)
WBC: 5.3 10*3/uL (ref 4.0–10.5)

## 2014-04-21 NOTE — Progress Notes (Signed)
  [facility] Progress Note  04/21/2014 5:36 PM  Patient originally was to be discharged today with home wound vac and home health wound care. Unfortunately, the materials were unable to be obtained today. I spoke with Silva Bandy, case manager about sending him home with wet to dry dressings until his wound vac equipment became available. Shortly after this conversation, I heard from Dr. Imogene Burn who wanted the patient to stay until he received all of his equipment. The patient was very upset and wanted to leave AMA. I spoke with the patient regarding his decision and the risks that may have on his wound as he would be leaving without a wound vac dressing and machine. The patient changed his mind and decided to stay overnight. Left message for Silva Bandy, case manager regarding this.   Maris Berger, PA-C [facility] Office: [phone] Pager: [phone] 04/21/2014 5:36 PM

## 2014-04-21 NOTE — Telephone Encounter (Addendum)
-----   Message from Sharee Pimple, RN sent at 04/20/2014  2:24 PM EST ----- Regarding: Schedule   ----- Message -----    From: [name], MD    Sent: 04/20/2014   1:29 PM      To: [address]  THADD MAUK [id number] 1945/12/22    PROCEDURE: Debridement of right below-knee amputation stump Placement of negative pressure dressing  Follow-up: 4 weeks    04/21/14: lm for pt re appt, dm

## 2014-04-21 NOTE — Progress Notes (Addendum)
  [facility] Progress Note  04/21/2014 7:41 AM 1 Day Post-Op  Subjective: No complaints. Reports that he owns a wound vac machine at home but is missing some components.   Tmax 98.3 BP sys 110s-150s 02 97% RA  Filed Vitals:   04/21/14 0522  BP: 118/54  Pulse: 63  Temp: 98.3 F (36.8 C)  Resp: 18    Physical Exam: Incisions:  Right stump wound vac seal intact. No output seen. No surrounding erythema or edema.   CBC    Component Value Date/Time   WBC 5.3 04/21/2014 0520   RBC 4.73 04/21/2014 0520   HGB 14.3 04/21/2014 0520   HCT 42.9 04/21/2014 0520   PLT 152 04/21/2014 0520   MCV 90.7 04/21/2014 0520   MCH 30.2 04/21/2014 0520   MCHC 33.3 04/21/2014 0520   RDW 12.9 04/21/2014 0520   LYMPHSABS 2.1 01/28/2014 1511   MONOABS 0.5 01/28/2014 1511   EOSABS 0.1 01/28/2014 1511   BASOSABS 0.0 01/28/2014 1511    BMET    Component Value Date/Time   NA 134* 04/20/2014 1120   K 3.8 04/20/2014 1120   CL 103 04/20/2014 1120   CO2 24 04/20/2014 1120   GLUCOSE 173* 04/20/2014 1120   BUN 13 04/20/2014 1120   CREATININE 1.13 04/20/2014 1120   CALCIUM 8.7 04/20/2014 1120   GFRNONAA 65* 04/20/2014 1120   GFRAA 76* 04/20/2014 1120    INR    Component Value Date/Time   INR 1.17 04/12/2013 2155   INR 1.6 04/25/2010 0904     Intake/Output Summary (Last 24 hours) at 04/21/14 0741 Last data filed at 04/20/14 2300  Gross per 24 hour  Intake    560 ml  Output   1100 ml  Net   -540 ml     Assessment:  69 y.o. male is s/p: debridement of right below-knee amputation stump and placement of negative pressure dressing.    1 Day Post-Op  Plan: -Stump appears viable. Will need to go home with negative pressure dressing. -Consulted case management for home wound vac equipment.  -Paroxysmal afib: will restart Xarelto today.  -Discharge home today after he receives equipment. Home health RN for management of wound.    Maris Berger, PA-C [facility] Office: [phone] Pager: [phone] 04/21/2014 7:41 AM   Addendum  I have independently interviewed and examined the patient, and I agree with the physician assistant's findings.  Ok to D/C today.  Admitted for post-anesthetic recovery given slow response in past and to finalize wound VAC arrangements.  [name], MD [facility] [place] Office: [phone] Pager: [phone]  04/21/2014, 8:17 AM

## 2014-04-21 NOTE — Care Management Note (Addendum)
Page 1 of 2   04/22/2014     4:54:52 PM CARE MANAGEMENT NOTE 04/22/2014  Patient:  Leroy [name], Leroy [name]   Account Number:  [id number]  Date Initiated:  04/21/2014  Documentation initiated by:  Marvetta Gibbons  Subjective/Objective Assessment:   Pt admited with stump infection     Action/Plan:   PTA pt lived at home- has active [facility] services with [place] and is seen at [facility] wound clinic   Anticipated DC Date:  04/21/2014   Anticipated DC Plan:  [place]  CM consult      [facility] Choice  Resumption Of Svcs/PTA Provider   Choice offered to / List presented to:          [facility] arranged  HH-1 RN      [place]   Status of service:  Completed, signed off Medicare Important Message given?  NA - LOS <3 / Initial given by admissions (If response is "NO", the following Medicare IM given date fields will be blank) Date Medicare IM given:   Medicare IM given by:   Date Additional Medicare IM given:   Additional Medicare IM given by:    Discharge Disposition:  [place]  Per UR Regulation:  Reviewed for med. necessity/level of care/duration of stay  If discussed at [place] of Stay Meetings, dates discussed:    Comments:  04/22/14- 1645- Marvetta Gibbons RN, bSN [phone] Pt did not discharge last night after all- MD had change of mind regarding drsg changes- now wants pt to have home wound VAC on prior to discharge- per [employer]- supplies have been shipped ?delivery Saturday vs Monday. Have again reached out to [facility] wound Care center- spoke with Anne Ng- regarding situation- explained that we just needed one sponge kit in order to get pt home- spoke with pt again this afternoon- and he states that he has spoken with a "Vaughan Basta" over at the wound center who will be bringing a kit over after 5- again called wound center- and asked to speak with Vaughan Basta- message left for her to call this [place] did return  call- and per conversation she does have sponge kit supplies that she can bring over after she gets off of work at the wound clinic- she states that she will be willing to assist bedside RN in changing over the wound VAC to pt's home VAC- and then pt should be able to go home- bedside RN aware and to notify PA with Dr. Bridgett Larsson- notified [facility] of pending discharge later tonight - expressed sincere appreciation to [facility] for her willingness to assist after hours to help pt get home.  04/21/14- [place] RN, BSN [phone] Referral for wound VAC assistance for discharge- spoke with pt earlier in day regarding d/c needs- per pt he has wound VAC and goes to [facility] wound Clinic every [place] in room- sticker on [facility] shows that it is from [place] [phone]) pt reports that he does not have any sponge kits to do the dressing changes at home- he states that someone usually calls and then he gets about 10 delivered at a time. Call made x2 to [employer] and messages left regarding need for supplies for discharge- have been awaiting return call. Pt states that he is also active with [facility] for [place]- order placed to resume services- have spoken with [facility] wound care center they too have #  to [employer] for supplies- pt has upcoming appointment for next Tues. Jan. 19 at 11:00- Per Stanton Kidney with [facility] they can have RN at the home tomorrow for wound care- Spoke with PAC-- Silva Bandy- plan is to have pt return home with wet to dry drsg changes until wound VAC supplies arrive- will change HH orders to reflect this- [facility] notified of plan- pt to d/c home today.  Have received return call from [employer]- they report that they had tried to contact pt yesterday regarding supplies did not reach him and did not know he was in hospital- they have placed order for supplies in system today- will ship out a 30 day supply tomorrow and hopefully pt will receive supplies on Sat. if not latest  would be Monday.  Pt aware of this  as is [facility].

## 2014-04-21 NOTE — Progress Notes (Signed)
Patient prepared for discharge.  Wound VAC D/C and wet to dry dressing applied as ordered. IV will be D/C just before discharge.  Patient getting dressed at this time.

## 2014-04-22 LAB — GLUCOSE, CAPILLARY
GLUCOSE-CAPILLARY: 58 mg/dL — AB (ref 70–99)
GLUCOSE-CAPILLARY: 124 mg/dL — AB (ref 70–99)
Glucose-Capillary: 75 mg/dL (ref 70–99)
Glucose-Capillary: 89 mg/dL (ref 70–99)

## 2014-04-22 LAB — BASIC METABOLIC PANEL
ANION GAP: 9 (ref 5–15)
BUN: 9 mg/dL (ref 6–23)
CHLORIDE: 106 meq/L (ref 96–112)
CO2: 23 mmol/L (ref 19–32)
CREATININE: 1.07 mg/dL (ref 0.50–1.35)
Calcium: 8.4 mg/dL (ref 8.4–10.5)
GFR calc Af Amer: 81 mL/min — ABNORMAL LOW (ref >90)
GFR calc non Af Amer: 70 mL/min — ABNORMAL LOW (ref >90)
GLUCOSE: 116 mg/dL — AB (ref 70–99)
Potassium: 3.8 mmol/L (ref 3.5–5.1)
SODIUM: 138 mmol/L (ref 135–145)

## 2014-04-22 LAB — CBC
HCT: 42.8 % (ref 39.0–52.0)
HEMOGLOBIN: 14.5 g/dL (ref 13.0–17.0)
MCH: 29.8 pg (ref 26.0–34.0)
MCHC: 33.9 g/dL (ref 30.0–36.0)
MCV: 88.1 fL (ref 78.0–100.0)
Platelets: 162 10*3/uL (ref 150–400)
RBC: 4.86 MIL/uL (ref 4.22–5.81)
RDW: 12.8 % (ref 11.5–15.5)
WBC: 5.8 10*3/uL (ref 4.0–10.5)

## 2014-04-22 MED ORDER — OXYCODONE-ACETAMINOPHEN 5-325 MG PO TABS: 1.0000 | ORAL_TABLET | Freq: Four times a day (QID) | ORAL | Status: DC | PRN

## 2014-04-22 NOTE — Progress Notes (Signed)
Nursing note Patient given AVS, medication list with next does due, paper prescriptions and discharge instructions, patient with home vac on. Will discharge home as ordered.[place], Randall An RN

## 2014-04-22 NOTE — Progress Notes (Addendum)
Hypoglycemic Event  CBG: 58  Treatment: 15 GM carbohydrate snack  Symptoms: None  Follow-up CBG: Time:1715 CBG Result:124  Possible Reasons for Event: Medication regimen: insulin/oral hypoglycemics and Unknown Leroy [name] made aware. Will monitor patient.     [name], Randall An  Remember to initiate Hypoglycemia Order Set & complete

## 2014-04-22 NOTE — Progress Notes (Signed)
   Daily Progress Note  Assessment/Planning: POD #2 s/p debridement of R BKA stump, VAC dressing   Home once wound care (VAC) arranged  Subjective  - 2 Days Post-Op  Wants to go home  Objective Filed Vitals:   04/21/14 0522 04/21/14 1528 04/21/14 2100 04/22/14 0500  BP: 118/54 148/56 147/43 147/50  Pulse: 63 72 74 74  Temp: 98.3 F (36.8 C) 98.3 F (36.8 C) 98.3 F (36.8 C) 98.3 F (36.8 C)  TempSrc: Oral Oral Oral Oral  Resp: 18 18 18 17   Height:      Weight:    301 lb 5.9 oz (136.7 kg)  SpO2: 97% 97% 98% 94%    Intake/Output Summary (Last 24 hours) at 04/22/14 0840 Last data filed at 04/22/14 0500  Gross per 24 hour  Intake    240 ml  Output    250 ml  Net    -10 ml    PULM  CTAB CV  RRR GI  soft, NTND VASC  R BKA, VAC dsg adherent  Laboratory CBC    Component Value Date/Time   WBC 5.8 04/22/2014 0529   HGB 14.5 04/22/2014 0529   HCT 42.8 04/22/2014 0529   PLT 162 04/22/2014 0529    BMET    Component Value Date/Time   NA 138 04/22/2014 0529   K 3.8 04/22/2014 0529   CL 106 04/22/2014 0529   CO2 23 04/22/2014 0529   GLUCOSE 116* 04/22/2014 0529   BUN 9 04/22/2014 0529   CREATININE 1.07 04/22/2014 0529   CALCIUM 8.4 04/22/2014 0529   GFRNONAA 70* 04/22/2014 0529   GFRAA 81* 04/22/2014 0529    [name], MD [facility] [place] Office: [phone] Pager: [phone]  04/22/2014, 8:40 AM

## 2014-04-23 NOTE — Discharge Summary (Signed)
[facility] Discharge Summary  Leroy [name] 1946-02-02 69 y.o. male  [id number]  Admission Date: 04/20/2014  Discharge Date: 04/22/2014  Physician: Adele Barthel, MD  Admission Diagnosis: Wound to right above knee amputation stump Z89.9  HPI:   This is a 69 y.o. male who is s/p right below-knee amputation on 04/14/13. 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 He is draining pus from that sinus today. He denies fever or chills. He is under the wound care at [facility].   Hospital Course:  The patient was admitted to the hospital and taken to the operating room on 04/20/2014 and underwent: debridement of right below-knee amputation stump and placement of negative pressure dressing.     The patient tolerated the procedure well and was transported to the PACU in good condition. He was transferred to the telemetry floor. Case management was consulted for home health wound vac equipment and supplies. Unfortunately, these material were not able to be acquired until POD 2. He was discharged home with a wound vac and home health RN for dressing changes.    CBC    Component Value Date/Time   WBC 5.8 04/22/2014 0529   RBC 4.86 04/22/2014 0529   HGB 14.5 04/22/2014 0529   HCT 42.8 04/22/2014 0529   PLT 162 04/22/2014 0529   MCV 88.1 04/22/2014 0529   MCH 29.8 04/22/2014 0529   MCHC 33.9 04/22/2014 0529   RDW 12.8 04/22/2014 0529   LYMPHSABS 2.1 01/28/2014 1511   MONOABS 0.5 01/28/2014 1511   EOSABS 0.1 01/28/2014 1511   BASOSABS 0.0 01/28/2014 1511    BMET    Component Value Date/Time   NA 138 04/22/2014 0529   K 3.8 04/22/2014 0529   CL 106 04/22/2014 0529   CO2 23 04/22/2014 0529   GLUCOSE 116* 04/22/2014 0529   BUN 9 04/22/2014 0529   CREATININE 1.07 04/22/2014 0529   CALCIUM 8.4 04/22/2014 0529   GFRNONAA 70* 04/22/2014 0529   GFRAA 81* 04/22/2014 0529     Discharge Instructions:   The patient  is discharged to home with extensive instructions on wound care and progressive ambulation.  They are instructed not to drive or perform any heavy lifting until returning to see the physician in his office.  Discharge Instructions    Call MD for:  redness, tenderness, or signs of infection (pain, swelling, bleeding, redness, odor or green/yellow discharge around incision site)    Complete by:  As directed      Call MD for:  severe or increased pain, loss or decreased feeling  in affected limb(s)    Complete by:  As directed      Call MD for:  temperature >100.5    Complete by:  As directed      Change dressing (specify)    Complete by:  As directed   Wound vac dressing changes every Monday, Wednesday, Friday for 4 weeks.  Continuous suction at 125 mm/Hg.     Driving Restrictions    Complete by:  As directed   No driving for 2 weeks     Increase activity slowly    Complete by:  As directed   Walk with assistance use walker or cane as needed     Lifting restrictions    Complete by:  As directed   No lifting restrictions     Resume previous diet    Complete by:  As directed  Discharge Diagnosis:  Wound to right above knee amputation stump Z89.9  Secondary Diagnosis: Patient Active Problem List   Diagnosis Date Noted  . Amputation stump infection 04/20/2014  . Cellulitis and abscess of leg 04/15/2014  . 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 Phantom limb pain 08/16/2013  . Pain in limb- Right stump 07/06/2013  . Atherosclerosis of native arteries of the extremities with ulceration(440.23) 06/25/2013  . S/P bilateral BKA (below knee amputation) 04/21/2013  . Chronic systolic heart failure 03/54/6568  . 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 S/P BKA (below knee amputation) bilateral 09/10/2012  . SVT (supraventricular tachycardia) 09/02/2012  . Hyperlipidemia with target LDL less than 70 01/02/2012  . Type II diabetes mellitus with  manifestations, uncontrolled 05/03/2010  . CAD 04/13/2010  . ATRIAL FIBRILLATION, PAROXYSMAL 04/13/2010   Past Medical History  Diagnosis Date  . Hyperlipidemia   . Ischemic cardiomyopathy     a. EF 40% 2010. b. Echo (2/14):  mild LVH, EF 30-35%, diff HK, Gr 1 DD, MAC, mild MR, mod LAE, PASP 39  . Cataract   . Hypotension   . Paroxysmal atrial fibrillation   . Peripheral vascular disease     a. s/p L BKA 2014.  [name] History of blood clots     in legs--this was in 2011--has been off of Coumadin 90month;takes Xarelto daily  . Joint pain   . H/O hiatal hernia   . Chronic kidney disease     on Lisinopril to protect kidneys d/t being on Metformin per pt  . Myocardial infarction     total of 8 heart attacks;last one in 2011  . Atrial flutter   . H/O medication noncompliance     Due to insurance issues  . Chronic systolic CHF (congestive heart failure)   . Coronary artery disease     a. s/p remote CABG 2001 at [facility] b. Cath 03/2010: for med rx.;  c.  [facility] (1/14):  Large Inferior apical MI, very small area of peri-infarct ischemia toward the inferior apical segment. EF 19%.  Med Rx was continued.    . Diabetes mellitus 1979    takes Metformni daily  and Lantus 50units bid  . DVT (deep venous thrombosis) 2011    legs  . Peripheral neuropathy   . Shortness of breath dyspnea        Medication List    STOP taking these medications        collagenase ointment  Commonly known as:  SANTYL     silver sulfADIAZINE 1 % cream  Commonly known as:  SILVADENE      TAKE these medications        ACCU-CHEK FASTCLIX LANCET Kit  Test up to TID dx: 250.62     ACCU-CHEK NANO SMARTVIEW W/DEVICE Kit  Test up to TID dx: 250.62     ACCU-CHEK SMARTVIEW CONTROL Liqd  Test up to TID dx: 250.62     acetaminophen 325 MG tablet  Commonly known as:  TYLENOL  Take 2 tablets (650 mg total) by mouth every 6 (six) hours as needed for mild pain or fever.     B-D SINGLE USE SWABS REGULAR  Pads     B-D SINGLE USE SWABS REGULAR Pads  Test up to TID dx: 250.62     doxycycline 100 MG DR capsule  Commonly known as:  DORYX  Take 100 mg by mouth 2 (two) times daily.     ezetimibe 10 MG tablet  Commonly known  as:  ZETIA  Take 1 tablet (10 mg total) by mouth daily.     glimepiride 4 MG tablet  Commonly known as:  AMARYL  Take 2 tablets (8 mg total) by mouth daily with breakfast.     insulin aspart 100 UNIT/ML injection  Commonly known as:  NOVOLOG  Inject 5 Units into the skin 3 (three) times daily with meals.     insulin glargine 100 UNIT/ML injection  Commonly known as:  LANTUS  Inject 0.45 mLs (45 Units total) into the skin 2 (two) times daily.     lisinopril 5 MG tablet  Commonly known as:  PRINIVIL,ZESTRIL  Take 1 tablet (5 mg total) by mouth daily.     metFORMIN 500 MG tablet  Commonly known as:  GLUCOPHAGE  Take 1 tablet (500 mg total) by mouth 2 (two) times daily with a meal.     metoprolol succinate 50 MG 24 hr tablet  Commonly known as:  TOPROL-XL  Take 1 tablet (50 mg total) by mouth 2 (two) times daily. Take with or immediately following a meal.     nitroGLYCERIN 0.4 MG SL tablet  Commonly known as:  NITROSTAT  Place 1 tablet (0.4 mg total) under the tongue every 5 (five) minutes as needed for chest pain.     oxyCODONE-acetaminophen 5-325 MG per tablet  Commonly known as:  PERCOCET/ROXICET  Take 1 tablet by mouth every 6 (six) hours as needed for moderate pain.     pantoprazole 40 MG tablet  Commonly known as:  PROTONIX  Take 1 tablet (40 mg total) by mouth daily.     PRODIGY NO CODING BLOOD GLUC test strip  Generic drug:  glucose blood     glucose blood test strip  Commonly known as:  ACCU-CHEK SMARTVIEW  Test up to TID dx: 250.62     rivaroxaban 20 MG Tabs tablet  Commonly known as:  XARELTO  Take 1 tablet (20 mg total) by mouth daily at 6 PM.        Percocet #15 No Refill  Disposition: Home  Patient's condition: is  Good  Follow up: 1. Dr. Bridgett Larsson in 4 weeks   Virgina Jock, PA-C [facility] [phone] 04/23/2014  12:00 PM   Addendum  I have independently interviewed and examined the patient, and I agree with the physician assistant's findings.  Pt had an uneventful debridement of his R BKA stump to try salvage the stump.  His post-operative course was only remarkable for administrative difficulties coordinating his home [facility] care for the R BKA stump.  He will follow up in 4 weeks.  Adele Barthel, MD [facility] [place] Office: [phone] Pager: [phone]  04/26/2014, 8:40 AM

## 2014-04-25 ENCOUNTER — Encounter (HOSPITAL_COMMUNITY): Payer: Self-pay | Admitting: Vascular Surgery

## 2014-04-26 DIAGNOSIS — E119 Type 2 diabetes mellitus without complications: Secondary | ICD-10-CM | POA: Diagnosis not present

## 2014-04-26 DIAGNOSIS — T8189XA Other complications of procedures, not elsewhere classified, initial encounter: Secondary | ICD-10-CM | POA: Diagnosis not present

## 2014-04-26 DIAGNOSIS — T879 Unspecified complications of amputation stump: Secondary | ICD-10-CM | POA: Diagnosis not present

## 2014-04-26 DIAGNOSIS — Z89511 Acquired absence of right leg below knee: Secondary | ICD-10-CM | POA: Diagnosis not present

## 2014-04-26 DIAGNOSIS — Y839 Surgical procedure, unspecified as the cause of abnormal reaction of the patient, or of later complication, without mention of misadventure at the time of the procedure: Secondary | ICD-10-CM | POA: Diagnosis not present

## 2014-05-01 DIAGNOSIS — T8189XA Other complications of procedures, not elsewhere classified, initial encounter: Secondary | ICD-10-CM | POA: Diagnosis not present

## 2014-05-01 DIAGNOSIS — E119 Type 2 diabetes mellitus without complications: Secondary | ICD-10-CM | POA: Diagnosis not present

## 2014-05-01 DIAGNOSIS — Z89511 Acquired absence of right leg below knee: Secondary | ICD-10-CM | POA: Diagnosis not present

## 2014-05-10 ENCOUNTER — Encounter (HOSPITAL_BASED_OUTPATIENT_CLINIC_OR_DEPARTMENT_OTHER): Payer: Commercial Managed Care - HMO | Attending: General Surgery

## 2014-05-10 DIAGNOSIS — T879 Unspecified complications of amputation stump: Secondary | ICD-10-CM | POA: Insufficient documentation

## 2014-05-10 DIAGNOSIS — Y835 Amputation of limb(s) as the cause of abnormal reaction of the patient, or of later complication, without mention of misadventure at the time of the procedure: Secondary | ICD-10-CM | POA: Diagnosis not present

## 2014-05-10 DIAGNOSIS — Z89511 Acquired absence of right leg below knee: Secondary | ICD-10-CM | POA: Diagnosis not present

## 2014-05-10 DIAGNOSIS — S88011D Complete traumatic amputation at knee level, right lower leg, subsequent encounter: Secondary | ICD-10-CM | POA: Insufficient documentation

## 2014-05-12 DIAGNOSIS — E119 Type 2 diabetes mellitus without complications: Secondary | ICD-10-CM | POA: Diagnosis not present

## 2014-05-12 DIAGNOSIS — Z9181 History of falling: Secondary | ICD-10-CM | POA: Diagnosis not present

## 2014-05-12 DIAGNOSIS — Z794 Long term (current) use of insulin: Secondary | ICD-10-CM | POA: Diagnosis not present

## 2014-05-12 DIAGNOSIS — I10 Essential (primary) hypertension: Secondary | ICD-10-CM | POA: Diagnosis not present

## 2014-05-12 DIAGNOSIS — G546 Phantom limb syndrome with pain: Secondary | ICD-10-CM | POA: Diagnosis not present

## 2014-05-12 DIAGNOSIS — I509 Heart failure, unspecified: Secondary | ICD-10-CM | POA: Diagnosis not present

## 2014-05-12 DIAGNOSIS — T8789 Other complications of amputation stump: Secondary | ICD-10-CM | POA: Diagnosis not present

## 2014-05-15 DIAGNOSIS — Z794 Long term (current) use of insulin: Secondary | ICD-10-CM | POA: Diagnosis not present

## 2014-05-15 DIAGNOSIS — G546 Phantom limb syndrome with pain: Secondary | ICD-10-CM | POA: Diagnosis not present

## 2014-05-15 DIAGNOSIS — E119 Type 2 diabetes mellitus without complications: Secondary | ICD-10-CM | POA: Diagnosis not present

## 2014-05-15 DIAGNOSIS — I10 Essential (primary) hypertension: Secondary | ICD-10-CM | POA: Diagnosis not present

## 2014-05-15 DIAGNOSIS — T8789 Other complications of amputation stump: Secondary | ICD-10-CM | POA: Diagnosis not present

## 2014-05-15 DIAGNOSIS — I509 Heart failure, unspecified: Secondary | ICD-10-CM | POA: Diagnosis not present

## 2014-05-15 DIAGNOSIS — Z9181 History of falling: Secondary | ICD-10-CM | POA: Diagnosis not present

## 2014-05-17 ENCOUNTER — Other Ambulatory Visit: Payer: Self-pay | Admitting: Internal Medicine

## 2014-05-17 DIAGNOSIS — Z89511 Acquired absence of right leg below knee: Secondary | ICD-10-CM | POA: Diagnosis not present

## 2014-05-17 DIAGNOSIS — S88011D Complete traumatic amputation at knee level, right lower leg, subsequent encounter: Secondary | ICD-10-CM | POA: Diagnosis not present

## 2014-05-17 DIAGNOSIS — T879 Unspecified complications of amputation stump: Secondary | ICD-10-CM | POA: Diagnosis not present

## 2014-05-19 ENCOUNTER — Encounter: Payer: Self-pay | Admitting: Vascular Surgery

## 2014-05-19 DIAGNOSIS — I10 Essential (primary) hypertension: Secondary | ICD-10-CM | POA: Diagnosis not present

## 2014-05-19 DIAGNOSIS — Z9181 History of falling: Secondary | ICD-10-CM | POA: Diagnosis not present

## 2014-05-19 DIAGNOSIS — T8789 Other complications of amputation stump: Secondary | ICD-10-CM | POA: Diagnosis not present

## 2014-05-19 DIAGNOSIS — Z89511 Acquired absence of right leg below knee: Secondary | ICD-10-CM | POA: Diagnosis not present

## 2014-05-19 DIAGNOSIS — Z794 Long term (current) use of insulin: Secondary | ICD-10-CM | POA: Diagnosis not present

## 2014-05-19 DIAGNOSIS — I509 Heart failure, unspecified: Secondary | ICD-10-CM | POA: Diagnosis not present

## 2014-05-19 DIAGNOSIS — G546 Phantom limb syndrome with pain: Secondary | ICD-10-CM | POA: Diagnosis not present

## 2014-05-19 DIAGNOSIS — E119 Type 2 diabetes mellitus without complications: Secondary | ICD-10-CM | POA: Diagnosis not present

## 2014-05-19 DIAGNOSIS — T8189XA Other complications of procedures, not elsewhere classified, initial encounter: Secondary | ICD-10-CM | POA: Diagnosis not present

## 2014-05-20 ENCOUNTER — Ambulatory Visit (INDEPENDENT_AMBULATORY_CARE_PROVIDER_SITE_OTHER): Payer: Self-pay | Admitting: Vascular Surgery

## 2014-05-20 ENCOUNTER — Encounter: Payer: Self-pay | Admitting: Vascular Surgery

## 2014-05-20 VITALS — BP 123/71 | HR 91 | Ht 69.0 in | Wt 301.0 lb

## 2014-05-20 DIAGNOSIS — Z89511 Acquired absence of right leg below knee: Secondary | ICD-10-CM

## 2014-05-20 DIAGNOSIS — Z89512 Acquired absence of left leg below knee: Secondary | ICD-10-CM

## 2014-05-20 DIAGNOSIS — L03119 Cellulitis of unspecified part of limb: Secondary | ICD-10-CM

## 2014-05-20 DIAGNOSIS — L02419 Cutaneous abscess of limb, unspecified: Secondary | ICD-10-CM

## 2014-05-20 NOTE — Progress Notes (Signed)
    Postoperative Visit   History of Present Illness  Leroy [name] is a 69 y.o. male who presents for postoperative follow-up for: debridement of R below-the-knee amputation (Date: 04/20/14).  The patient's wounds are healing with VAC dressing.  The patient notes pain is well controlled.  The patient's current symptoms are: none.  For VQI Use Only  PRE-ADM LIVING: Home  AMB STATUS: Wheelchair  Physical Examination  Filed Vitals:   05/20/14 0942  BP: 123/71  Pulse: 91   RLE: L medial BKA stump wound is clean with good granulation, >50% wound contracture   Medical Decision Making  Leroy [name] is a 69 y.o. male who presents s/p debridement of R below-the-knee ampuation. The patient's stump is healing appropriately despite my prior concerns he did not have enough blood flow to heal the wound.   There is no evidence of reformation of any sinus cavity after the debridement. Follow up in 1 month for recheck.  If he is healed at that point, I will refer him for prosthetic fitting.  [name], MD [facility] [place] Office: [phone] Pager: [phone]  05/20/2014, 10:06 AM

## 2014-05-22 DIAGNOSIS — T8789 Other complications of amputation stump: Secondary | ICD-10-CM | POA: Diagnosis not present

## 2014-05-22 DIAGNOSIS — I509 Heart failure, unspecified: Secondary | ICD-10-CM | POA: Diagnosis not present

## 2014-05-22 DIAGNOSIS — E119 Type 2 diabetes mellitus without complications: Secondary | ICD-10-CM | POA: Diagnosis not present

## 2014-05-22 DIAGNOSIS — Z794 Long term (current) use of insulin: Secondary | ICD-10-CM | POA: Diagnosis not present

## 2014-05-22 DIAGNOSIS — G546 Phantom limb syndrome with pain: Secondary | ICD-10-CM | POA: Diagnosis not present

## 2014-05-22 DIAGNOSIS — I10 Essential (primary) hypertension: Secondary | ICD-10-CM | POA: Diagnosis not present

## 2014-05-22 DIAGNOSIS — Z9181 History of falling: Secondary | ICD-10-CM | POA: Diagnosis not present

## 2014-05-24 DIAGNOSIS — T879 Unspecified complications of amputation stump: Secondary | ICD-10-CM | POA: Diagnosis not present

## 2014-05-24 DIAGNOSIS — Z89511 Acquired absence of right leg below knee: Secondary | ICD-10-CM | POA: Diagnosis not present

## 2014-05-24 DIAGNOSIS — S88011D Complete traumatic amputation at knee level, right lower leg, subsequent encounter: Secondary | ICD-10-CM | POA: Diagnosis not present

## 2014-05-26 DIAGNOSIS — G546 Phantom limb syndrome with pain: Secondary | ICD-10-CM | POA: Diagnosis not present

## 2014-05-26 DIAGNOSIS — I509 Heart failure, unspecified: Secondary | ICD-10-CM | POA: Diagnosis not present

## 2014-05-26 DIAGNOSIS — E119 Type 2 diabetes mellitus without complications: Secondary | ICD-10-CM | POA: Diagnosis not present

## 2014-05-26 DIAGNOSIS — T8789 Other complications of amputation stump: Secondary | ICD-10-CM | POA: Diagnosis not present

## 2014-05-26 DIAGNOSIS — I10 Essential (primary) hypertension: Secondary | ICD-10-CM | POA: Diagnosis not present

## 2014-05-26 DIAGNOSIS — Z9181 History of falling: Secondary | ICD-10-CM | POA: Diagnosis not present

## 2014-05-26 DIAGNOSIS — Z794 Long term (current) use of insulin: Secondary | ICD-10-CM | POA: Diagnosis not present

## 2014-05-28 DIAGNOSIS — T8789 Other complications of amputation stump: Secondary | ICD-10-CM | POA: Diagnosis not present

## 2014-05-28 DIAGNOSIS — I509 Heart failure, unspecified: Secondary | ICD-10-CM | POA: Diagnosis not present

## 2014-05-28 DIAGNOSIS — E119 Type 2 diabetes mellitus without complications: Secondary | ICD-10-CM | POA: Diagnosis not present

## 2014-05-28 DIAGNOSIS — Z9181 History of falling: Secondary | ICD-10-CM | POA: Diagnosis not present

## 2014-05-28 DIAGNOSIS — Z794 Long term (current) use of insulin: Secondary | ICD-10-CM | POA: Diagnosis not present

## 2014-05-28 DIAGNOSIS — I10 Essential (primary) hypertension: Secondary | ICD-10-CM | POA: Diagnosis not present

## 2014-05-28 DIAGNOSIS — G546 Phantom limb syndrome with pain: Secondary | ICD-10-CM | POA: Diagnosis not present

## 2014-05-31 DIAGNOSIS — T8789 Other complications of amputation stump: Secondary | ICD-10-CM | POA: Diagnosis not present

## 2014-05-31 DIAGNOSIS — Z9181 History of falling: Secondary | ICD-10-CM | POA: Diagnosis not present

## 2014-05-31 DIAGNOSIS — I2510 Atherosclerotic heart disease of native coronary artery without angina pectoris: Secondary | ICD-10-CM | POA: Diagnosis not present

## 2014-05-31 DIAGNOSIS — I10 Essential (primary) hypertension: Secondary | ICD-10-CM | POA: Diagnosis not present

## 2014-05-31 DIAGNOSIS — I872 Venous insufficiency (chronic) (peripheral): Secondary | ICD-10-CM | POA: Diagnosis not present

## 2014-05-31 DIAGNOSIS — I509 Heart failure, unspecified: Secondary | ICD-10-CM | POA: Diagnosis not present

## 2014-05-31 DIAGNOSIS — G546 Phantom limb syndrome with pain: Secondary | ICD-10-CM | POA: Diagnosis not present

## 2014-05-31 DIAGNOSIS — Z794 Long term (current) use of insulin: Secondary | ICD-10-CM | POA: Diagnosis not present

## 2014-05-31 DIAGNOSIS — E119 Type 2 diabetes mellitus without complications: Secondary | ICD-10-CM | POA: Diagnosis not present

## 2014-06-01 DIAGNOSIS — E119 Type 2 diabetes mellitus without complications: Secondary | ICD-10-CM | POA: Diagnosis not present

## 2014-06-01 DIAGNOSIS — T8189XA Other complications of procedures, not elsewhere classified, initial encounter: Secondary | ICD-10-CM | POA: Diagnosis not present

## 2014-06-01 DIAGNOSIS — Z89511 Acquired absence of right leg below knee: Secondary | ICD-10-CM | POA: Diagnosis not present

## 2014-06-02 DIAGNOSIS — Z9181 History of falling: Secondary | ICD-10-CM | POA: Diagnosis not present

## 2014-06-02 DIAGNOSIS — T8789 Other complications of amputation stump: Secondary | ICD-10-CM | POA: Diagnosis not present

## 2014-06-02 DIAGNOSIS — G546 Phantom limb syndrome with pain: Secondary | ICD-10-CM | POA: Diagnosis not present

## 2014-06-02 DIAGNOSIS — E119 Type 2 diabetes mellitus without complications: Secondary | ICD-10-CM | POA: Diagnosis not present

## 2014-06-02 DIAGNOSIS — Z794 Long term (current) use of insulin: Secondary | ICD-10-CM | POA: Diagnosis not present

## 2014-06-02 DIAGNOSIS — I2510 Atherosclerotic heart disease of native coronary artery without angina pectoris: Secondary | ICD-10-CM | POA: Diagnosis not present

## 2014-06-02 DIAGNOSIS — I10 Essential (primary) hypertension: Secondary | ICD-10-CM | POA: Diagnosis not present

## 2014-06-02 DIAGNOSIS — I509 Heart failure, unspecified: Secondary | ICD-10-CM | POA: Diagnosis not present

## 2014-06-02 DIAGNOSIS — I872 Venous insufficiency (chronic) (peripheral): Secondary | ICD-10-CM | POA: Diagnosis not present

## 2014-06-05 DIAGNOSIS — I2510 Atherosclerotic heart disease of native coronary artery without angina pectoris: Secondary | ICD-10-CM | POA: Diagnosis not present

## 2014-06-05 DIAGNOSIS — E119 Type 2 diabetes mellitus without complications: Secondary | ICD-10-CM | POA: Diagnosis not present

## 2014-06-05 DIAGNOSIS — G546 Phantom limb syndrome with pain: Secondary | ICD-10-CM | POA: Diagnosis not present

## 2014-06-05 DIAGNOSIS — Z794 Long term (current) use of insulin: Secondary | ICD-10-CM | POA: Diagnosis not present

## 2014-06-05 DIAGNOSIS — T8789 Other complications of amputation stump: Secondary | ICD-10-CM | POA: Diagnosis not present

## 2014-06-05 DIAGNOSIS — I10 Essential (primary) hypertension: Secondary | ICD-10-CM | POA: Diagnosis not present

## 2014-06-05 DIAGNOSIS — I872 Venous insufficiency (chronic) (peripheral): Secondary | ICD-10-CM | POA: Diagnosis not present

## 2014-06-05 DIAGNOSIS — Z9181 History of falling: Secondary | ICD-10-CM | POA: Diagnosis not present

## 2014-06-05 DIAGNOSIS — I509 Heart failure, unspecified: Secondary | ICD-10-CM | POA: Diagnosis not present

## 2014-06-07 DIAGNOSIS — I2510 Atherosclerotic heart disease of native coronary artery without angina pectoris: Secondary | ICD-10-CM | POA: Diagnosis not present

## 2014-06-07 DIAGNOSIS — I872 Venous insufficiency (chronic) (peripheral): Secondary | ICD-10-CM | POA: Diagnosis not present

## 2014-06-07 DIAGNOSIS — E119 Type 2 diabetes mellitus without complications: Secondary | ICD-10-CM | POA: Diagnosis not present

## 2014-06-07 DIAGNOSIS — G546 Phantom limb syndrome with pain: Secondary | ICD-10-CM | POA: Diagnosis not present

## 2014-06-07 DIAGNOSIS — Z9181 History of falling: Secondary | ICD-10-CM | POA: Diagnosis not present

## 2014-06-07 DIAGNOSIS — I509 Heart failure, unspecified: Secondary | ICD-10-CM | POA: Diagnosis not present

## 2014-06-07 DIAGNOSIS — I10 Essential (primary) hypertension: Secondary | ICD-10-CM | POA: Diagnosis not present

## 2014-06-07 DIAGNOSIS — Z794 Long term (current) use of insulin: Secondary | ICD-10-CM | POA: Diagnosis not present

## 2014-06-07 DIAGNOSIS — T8789 Other complications of amputation stump: Secondary | ICD-10-CM | POA: Diagnosis not present

## 2014-06-09 ENCOUNTER — Encounter: Payer: Self-pay | Admitting: Vascular Surgery

## 2014-06-09 DIAGNOSIS — Z794 Long term (current) use of insulin: Secondary | ICD-10-CM | POA: Diagnosis not present

## 2014-06-09 DIAGNOSIS — Z9181 History of falling: Secondary | ICD-10-CM | POA: Diagnosis not present

## 2014-06-09 DIAGNOSIS — I2510 Atherosclerotic heart disease of native coronary artery without angina pectoris: Secondary | ICD-10-CM | POA: Diagnosis not present

## 2014-06-09 DIAGNOSIS — E119 Type 2 diabetes mellitus without complications: Secondary | ICD-10-CM | POA: Diagnosis not present

## 2014-06-09 DIAGNOSIS — I10 Essential (primary) hypertension: Secondary | ICD-10-CM | POA: Diagnosis not present

## 2014-06-09 DIAGNOSIS — T8789 Other complications of amputation stump: Secondary | ICD-10-CM | POA: Diagnosis not present

## 2014-06-09 DIAGNOSIS — G546 Phantom limb syndrome with pain: Secondary | ICD-10-CM | POA: Diagnosis not present

## 2014-06-09 DIAGNOSIS — I509 Heart failure, unspecified: Secondary | ICD-10-CM | POA: Diagnosis not present

## 2014-06-09 DIAGNOSIS — I872 Venous insufficiency (chronic) (peripheral): Secondary | ICD-10-CM | POA: Diagnosis not present

## 2014-06-10 ENCOUNTER — Telehealth: Payer: Self-pay | Admitting: *Deleted

## 2014-06-10 ENCOUNTER — Encounter: Payer: Self-pay | Admitting: Vascular Surgery

## 2014-06-10 ENCOUNTER — Ambulatory Visit (INDEPENDENT_AMBULATORY_CARE_PROVIDER_SITE_OTHER): Payer: Self-pay | Admitting: Vascular Surgery

## 2014-06-10 VITALS — BP 117/60 | HR 75 | Resp 18 | Ht 69.0 in | Wt 292.0 lb

## 2014-06-10 DIAGNOSIS — Z89512 Acquired absence of left leg below knee: Secondary | ICD-10-CM

## 2014-06-10 DIAGNOSIS — Z89511 Acquired absence of right leg below knee: Secondary | ICD-10-CM

## 2014-06-10 NOTE — Progress Notes (Signed)
    Postoperative Visit   History of Present Illness  Leroy [name] is a 69 y.o. male   who presents for postoperative follow-up for: debridement of R below-the-knee amputation (Date: 04/20/14). The patient's wounds are healing with VAC dressing. The patient notes pain is well controlled. The patient's current symptoms are: none.  For VQI Use Only  PRE-ADM LIVING: Home  AMB STATUS: Wheelchair  Physical Examination  Filed Vitals:   06/10/14 0928  BP: 117/60  Pulse: 75  Resp: 18  Height:  (1.753 m)  Weight: 292 lb (132.45 kg)   RLE: L medial BKA stump wound with fibrinous exudate, >75% wound contracture  Medical Decision Making  Leroy [name] is a 69 y.o. male  who presents s/p debridement of R below-the-knee ampuation.  The patient's stump is healing appropriately despite my prior concerns he did not have enough blood flow to heal the wound.   There is no evidence of reformation of any sinus cavity after the debridement.  Would change to:  Santyl to R BKA wound daily.   Wet-to-dry dressing to R BKA daily  Follow up in 1 month for recheck.   If he is healed at that point, I will refer him for prosthetic fitting.  [name], MD [facility] Office: [phone] Pager: [phone]  06/10/2014, 9:40 AM

## 2014-06-10 NOTE — Telephone Encounter (Signed)
Rondall Allegra RN with [facility] the home health agency providing care for patient.  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

## 2014-06-12 DIAGNOSIS — E119 Type 2 diabetes mellitus without complications: Secondary | ICD-10-CM | POA: Diagnosis not present

## 2014-06-12 DIAGNOSIS — Z9181 History of falling: Secondary | ICD-10-CM | POA: Diagnosis not present

## 2014-06-12 DIAGNOSIS — I872 Venous insufficiency (chronic) (peripheral): Secondary | ICD-10-CM | POA: Diagnosis not present

## 2014-06-12 DIAGNOSIS — I509 Heart failure, unspecified: Secondary | ICD-10-CM | POA: Diagnosis not present

## 2014-06-12 DIAGNOSIS — T8789 Other complications of amputation stump: Secondary | ICD-10-CM | POA: Diagnosis not present

## 2014-06-12 DIAGNOSIS — Z794 Long term (current) use of insulin: Secondary | ICD-10-CM | POA: Diagnosis not present

## 2014-06-12 DIAGNOSIS — G546 Phantom limb syndrome with pain: Secondary | ICD-10-CM | POA: Diagnosis not present

## 2014-06-12 DIAGNOSIS — I2510 Atherosclerotic heart disease of native coronary artery without angina pectoris: Secondary | ICD-10-CM | POA: Diagnosis not present

## 2014-06-12 DIAGNOSIS — I10 Essential (primary) hypertension: Secondary | ICD-10-CM | POA: Diagnosis not present

## 2014-06-13 DIAGNOSIS — Z9181 History of falling: Secondary | ICD-10-CM | POA: Diagnosis not present

## 2014-06-13 DIAGNOSIS — G546 Phantom limb syndrome with pain: Secondary | ICD-10-CM | POA: Diagnosis not present

## 2014-06-13 DIAGNOSIS — I10 Essential (primary) hypertension: Secondary | ICD-10-CM | POA: Diagnosis not present

## 2014-06-13 DIAGNOSIS — E119 Type 2 diabetes mellitus without complications: Secondary | ICD-10-CM | POA: Diagnosis not present

## 2014-06-13 DIAGNOSIS — I872 Venous insufficiency (chronic) (peripheral): Secondary | ICD-10-CM | POA: Diagnosis not present

## 2014-06-13 DIAGNOSIS — I2510 Atherosclerotic heart disease of native coronary artery without angina pectoris: Secondary | ICD-10-CM | POA: Diagnosis not present

## 2014-06-13 DIAGNOSIS — T8789 Other complications of amputation stump: Secondary | ICD-10-CM | POA: Diagnosis not present

## 2014-06-13 DIAGNOSIS — Z794 Long term (current) use of insulin: Secondary | ICD-10-CM | POA: Diagnosis not present

## 2014-06-13 DIAGNOSIS — I509 Heart failure, unspecified: Secondary | ICD-10-CM | POA: Diagnosis not present

## 2014-06-14 ENCOUNTER — Encounter (HOSPITAL_BASED_OUTPATIENT_CLINIC_OR_DEPARTMENT_OTHER): Payer: Commercial Managed Care - HMO | Attending: General Surgery

## 2014-06-14 DIAGNOSIS — Z89511 Acquired absence of right leg below knee: Secondary | ICD-10-CM | POA: Insufficient documentation

## 2014-06-14 DIAGNOSIS — S88011A Complete traumatic amputation at knee level, right lower leg, initial encounter: Secondary | ICD-10-CM | POA: Diagnosis not present

## 2014-06-14 DIAGNOSIS — T879 Unspecified complications of amputation stump: Secondary | ICD-10-CM | POA: Diagnosis not present

## 2014-06-14 DIAGNOSIS — T8189XA Other complications of procedures, not elsewhere classified, initial encounter: Secondary | ICD-10-CM | POA: Insufficient documentation

## 2014-06-15 DIAGNOSIS — I509 Heart failure, unspecified: Secondary | ICD-10-CM | POA: Diagnosis not present

## 2014-06-15 DIAGNOSIS — E119 Type 2 diabetes mellitus without complications: Secondary | ICD-10-CM | POA: Diagnosis not present

## 2014-06-15 DIAGNOSIS — I10 Essential (primary) hypertension: Secondary | ICD-10-CM | POA: Diagnosis not present

## 2014-06-15 DIAGNOSIS — Z89511 Acquired absence of right leg below knee: Secondary | ICD-10-CM | POA: Diagnosis not present

## 2014-06-15 DIAGNOSIS — I872 Venous insufficiency (chronic) (peripheral): Secondary | ICD-10-CM | POA: Diagnosis not present

## 2014-06-15 DIAGNOSIS — I2510 Atherosclerotic heart disease of native coronary artery without angina pectoris: Secondary | ICD-10-CM | POA: Diagnosis not present

## 2014-06-15 DIAGNOSIS — Z794 Long term (current) use of insulin: Secondary | ICD-10-CM | POA: Diagnosis not present

## 2014-06-15 DIAGNOSIS — Z89512 Acquired absence of left leg below knee: Secondary | ICD-10-CM | POA: Diagnosis not present

## 2014-06-15 DIAGNOSIS — T8789 Other complications of amputation stump: Secondary | ICD-10-CM | POA: Diagnosis not present

## 2014-06-15 DIAGNOSIS — Z9181 History of falling: Secondary | ICD-10-CM | POA: Diagnosis not present

## 2014-06-15 DIAGNOSIS — G546 Phantom limb syndrome with pain: Secondary | ICD-10-CM | POA: Diagnosis not present

## 2014-06-16 DIAGNOSIS — T8789 Other complications of amputation stump: Secondary | ICD-10-CM | POA: Diagnosis not present

## 2014-06-16 DIAGNOSIS — E119 Type 2 diabetes mellitus without complications: Secondary | ICD-10-CM | POA: Diagnosis not present

## 2014-06-16 DIAGNOSIS — G546 Phantom limb syndrome with pain: Secondary | ICD-10-CM | POA: Diagnosis not present

## 2014-06-16 DIAGNOSIS — I10 Essential (primary) hypertension: Secondary | ICD-10-CM | POA: Diagnosis not present

## 2014-06-16 DIAGNOSIS — I2510 Atherosclerotic heart disease of native coronary artery without angina pectoris: Secondary | ICD-10-CM | POA: Diagnosis not present

## 2014-06-16 DIAGNOSIS — Z794 Long term (current) use of insulin: Secondary | ICD-10-CM | POA: Diagnosis not present

## 2014-06-16 DIAGNOSIS — I872 Venous insufficiency (chronic) (peripheral): Secondary | ICD-10-CM | POA: Diagnosis not present

## 2014-06-16 DIAGNOSIS — I509 Heart failure, unspecified: Secondary | ICD-10-CM | POA: Diagnosis not present

## 2014-06-16 DIAGNOSIS — Z9181 History of falling: Secondary | ICD-10-CM | POA: Diagnosis not present

## 2014-06-17 DIAGNOSIS — T8189XA Other complications of procedures, not elsewhere classified, initial encounter: Secondary | ICD-10-CM | POA: Diagnosis not present

## 2014-06-17 DIAGNOSIS — I509 Heart failure, unspecified: Secondary | ICD-10-CM | POA: Diagnosis not present

## 2014-06-17 DIAGNOSIS — Z9181 History of falling: Secondary | ICD-10-CM | POA: Diagnosis not present

## 2014-06-17 DIAGNOSIS — I872 Venous insufficiency (chronic) (peripheral): Secondary | ICD-10-CM | POA: Diagnosis not present

## 2014-06-17 DIAGNOSIS — E119 Type 2 diabetes mellitus without complications: Secondary | ICD-10-CM | POA: Diagnosis not present

## 2014-06-17 DIAGNOSIS — Z794 Long term (current) use of insulin: Secondary | ICD-10-CM | POA: Diagnosis not present

## 2014-06-17 DIAGNOSIS — I10 Essential (primary) hypertension: Secondary | ICD-10-CM | POA: Diagnosis not present

## 2014-06-17 DIAGNOSIS — T8789 Other complications of amputation stump: Secondary | ICD-10-CM | POA: Diagnosis not present

## 2014-06-17 DIAGNOSIS — Z89511 Acquired absence of right leg below knee: Secondary | ICD-10-CM | POA: Diagnosis not present

## 2014-06-17 DIAGNOSIS — G546 Phantom limb syndrome with pain: Secondary | ICD-10-CM | POA: Diagnosis not present

## 2014-06-17 DIAGNOSIS — I2510 Atherosclerotic heart disease of native coronary artery without angina pectoris: Secondary | ICD-10-CM | POA: Diagnosis not present

## 2014-06-18 DIAGNOSIS — Z794 Long term (current) use of insulin: Secondary | ICD-10-CM | POA: Diagnosis not present

## 2014-06-18 DIAGNOSIS — G546 Phantom limb syndrome with pain: Secondary | ICD-10-CM | POA: Diagnosis not present

## 2014-06-18 DIAGNOSIS — I2510 Atherosclerotic heart disease of native coronary artery without angina pectoris: Secondary | ICD-10-CM | POA: Diagnosis not present

## 2014-06-18 DIAGNOSIS — I10 Essential (primary) hypertension: Secondary | ICD-10-CM | POA: Diagnosis not present

## 2014-06-18 DIAGNOSIS — Z9181 History of falling: Secondary | ICD-10-CM | POA: Diagnosis not present

## 2014-06-18 DIAGNOSIS — E119 Type 2 diabetes mellitus without complications: Secondary | ICD-10-CM | POA: Diagnosis not present

## 2014-06-18 DIAGNOSIS — T8789 Other complications of amputation stump: Secondary | ICD-10-CM | POA: Diagnosis not present

## 2014-06-18 DIAGNOSIS — I872 Venous insufficiency (chronic) (peripheral): Secondary | ICD-10-CM | POA: Diagnosis not present

## 2014-06-18 DIAGNOSIS — I509 Heart failure, unspecified: Secondary | ICD-10-CM | POA: Diagnosis not present

## 2014-06-19 DIAGNOSIS — I2510 Atherosclerotic heart disease of native coronary artery without angina pectoris: Secondary | ICD-10-CM | POA: Diagnosis not present

## 2014-06-19 DIAGNOSIS — I872 Venous insufficiency (chronic) (peripheral): Secondary | ICD-10-CM | POA: Diagnosis not present

## 2014-06-19 DIAGNOSIS — T8789 Other complications of amputation stump: Secondary | ICD-10-CM | POA: Diagnosis not present

## 2014-06-19 DIAGNOSIS — Z794 Long term (current) use of insulin: Secondary | ICD-10-CM | POA: Diagnosis not present

## 2014-06-19 DIAGNOSIS — I509 Heart failure, unspecified: Secondary | ICD-10-CM | POA: Diagnosis not present

## 2014-06-19 DIAGNOSIS — Z9181 History of falling: Secondary | ICD-10-CM | POA: Diagnosis not present

## 2014-06-19 DIAGNOSIS — E119 Type 2 diabetes mellitus without complications: Secondary | ICD-10-CM | POA: Diagnosis not present

## 2014-06-19 DIAGNOSIS — I10 Essential (primary) hypertension: Secondary | ICD-10-CM | POA: Diagnosis not present

## 2014-06-19 DIAGNOSIS — G546 Phantom limb syndrome with pain: Secondary | ICD-10-CM | POA: Diagnosis not present

## 2014-06-21 DIAGNOSIS — T879 Unspecified complications of amputation stump: Secondary | ICD-10-CM | POA: Diagnosis not present

## 2014-06-21 DIAGNOSIS — Z89511 Acquired absence of right leg below knee: Secondary | ICD-10-CM | POA: Diagnosis not present

## 2014-06-21 DIAGNOSIS — S88011A Complete traumatic amputation at knee level, right lower leg, initial encounter: Secondary | ICD-10-CM | POA: Diagnosis not present

## 2014-06-21 DIAGNOSIS — T8189XA Other complications of procedures, not elsewhere classified, initial encounter: Secondary | ICD-10-CM | POA: Diagnosis not present

## 2014-06-23 DIAGNOSIS — I872 Venous insufficiency (chronic) (peripheral): Secondary | ICD-10-CM | POA: Diagnosis not present

## 2014-06-23 DIAGNOSIS — I2510 Atherosclerotic heart disease of native coronary artery without angina pectoris: Secondary | ICD-10-CM | POA: Diagnosis not present

## 2014-06-23 DIAGNOSIS — Z9181 History of falling: Secondary | ICD-10-CM | POA: Diagnosis not present

## 2014-06-23 DIAGNOSIS — T8789 Other complications of amputation stump: Secondary | ICD-10-CM | POA: Diagnosis not present

## 2014-06-23 DIAGNOSIS — G546 Phantom limb syndrome with pain: Secondary | ICD-10-CM | POA: Diagnosis not present

## 2014-06-23 DIAGNOSIS — I10 Essential (primary) hypertension: Secondary | ICD-10-CM | POA: Diagnosis not present

## 2014-06-23 DIAGNOSIS — Z794 Long term (current) use of insulin: Secondary | ICD-10-CM | POA: Diagnosis not present

## 2014-06-23 DIAGNOSIS — I509 Heart failure, unspecified: Secondary | ICD-10-CM | POA: Diagnosis not present

## 2014-06-23 DIAGNOSIS — E119 Type 2 diabetes mellitus without complications: Secondary | ICD-10-CM | POA: Diagnosis not present

## 2014-06-26 DIAGNOSIS — I509 Heart failure, unspecified: Secondary | ICD-10-CM | POA: Diagnosis not present

## 2014-06-26 DIAGNOSIS — Z794 Long term (current) use of insulin: Secondary | ICD-10-CM | POA: Diagnosis not present

## 2014-06-26 DIAGNOSIS — I10 Essential (primary) hypertension: Secondary | ICD-10-CM | POA: Diagnosis not present

## 2014-06-26 DIAGNOSIS — E119 Type 2 diabetes mellitus without complications: Secondary | ICD-10-CM | POA: Diagnosis not present

## 2014-06-26 DIAGNOSIS — I2510 Atherosclerotic heart disease of native coronary artery without angina pectoris: Secondary | ICD-10-CM | POA: Diagnosis not present

## 2014-06-26 DIAGNOSIS — I872 Venous insufficiency (chronic) (peripheral): Secondary | ICD-10-CM | POA: Diagnosis not present

## 2014-06-26 DIAGNOSIS — Z9181 History of falling: Secondary | ICD-10-CM | POA: Diagnosis not present

## 2014-06-26 DIAGNOSIS — G546 Phantom limb syndrome with pain: Secondary | ICD-10-CM | POA: Diagnosis not present

## 2014-06-26 DIAGNOSIS — T8789 Other complications of amputation stump: Secondary | ICD-10-CM | POA: Diagnosis not present

## 2014-06-28 DIAGNOSIS — S88011A Complete traumatic amputation at knee level, right lower leg, initial encounter: Secondary | ICD-10-CM | POA: Diagnosis not present

## 2014-06-28 DIAGNOSIS — Z89511 Acquired absence of right leg below knee: Secondary | ICD-10-CM | POA: Diagnosis not present

## 2014-06-28 DIAGNOSIS — T879 Unspecified complications of amputation stump: Secondary | ICD-10-CM | POA: Diagnosis not present

## 2014-06-28 DIAGNOSIS — T8189XA Other complications of procedures, not elsewhere classified, initial encounter: Secondary | ICD-10-CM | POA: Diagnosis not present

## 2014-06-29 ENCOUNTER — Encounter: Payer: Self-pay | Admitting: Vascular Surgery

## 2014-06-30 ENCOUNTER — Encounter: Payer: Self-pay | Admitting: Vascular Surgery

## 2014-06-30 ENCOUNTER — Ambulatory Visit (INDEPENDENT_AMBULATORY_CARE_PROVIDER_SITE_OTHER): Payer: Self-pay | Admitting: Vascular Surgery

## 2014-06-30 VITALS — BP 129/48 | HR 70 | Resp 18 | Ht 69.0 in | Wt 292.0 lb

## 2014-06-30 DIAGNOSIS — I872 Venous insufficiency (chronic) (peripheral): Secondary | ICD-10-CM | POA: Diagnosis not present

## 2014-06-30 DIAGNOSIS — E119 Type 2 diabetes mellitus without complications: Secondary | ICD-10-CM | POA: Diagnosis not present

## 2014-06-30 DIAGNOSIS — Z9181 History of falling: Secondary | ICD-10-CM | POA: Diagnosis not present

## 2014-06-30 DIAGNOSIS — I10 Essential (primary) hypertension: Secondary | ICD-10-CM | POA: Diagnosis not present

## 2014-06-30 DIAGNOSIS — Z794 Long term (current) use of insulin: Secondary | ICD-10-CM | POA: Diagnosis not present

## 2014-06-30 DIAGNOSIS — I509 Heart failure, unspecified: Secondary | ICD-10-CM | POA: Diagnosis not present

## 2014-06-30 DIAGNOSIS — G546 Phantom limb syndrome with pain: Secondary | ICD-10-CM | POA: Diagnosis not present

## 2014-06-30 DIAGNOSIS — Z89512 Acquired absence of left leg below knee: Secondary | ICD-10-CM

## 2014-06-30 DIAGNOSIS — T8789 Other complications of amputation stump: Secondary | ICD-10-CM | POA: Diagnosis not present

## 2014-06-30 DIAGNOSIS — I2510 Atherosclerotic heart disease of native coronary artery without angina pectoris: Secondary | ICD-10-CM | POA: Diagnosis not present

## 2014-06-30 DIAGNOSIS — Z89511 Acquired absence of right leg below knee: Secondary | ICD-10-CM

## 2014-06-30 NOTE — Progress Notes (Signed)
     Postoperative Visit   History of Present Illness  Leroy [name] is a 69 y.o. male  who presents for postoperative follow-up for: debridement of R below-the-knee amputation (Date: 04/20/14). Pt's is undergoing wound care with [facility].  Per Pt wound has contracted by 50%.  Increased serous drainage from the wound.  No fever or chills.  For VQI Use Only  PRE-ADM LIVING: Home  AMB STATUS: Wheelchair  Physical Examination  Filed Vitals:   06/30/14 1204  BP: 129/48  Pulse: 70  Resp: 18  Height: 5\' 9"  (1.753 m)  Weight: 292 lb (132.45 kg)   RLE: L medial BKA stump wound clean with obvious granulation, wound still ~2-3 mm deep  Medical Decision Making  Leroy [name] is a 69 y.o. male  who presents s/p debridement of R below-the-knee ampuation.   Continue with wound care at [facility]  Follow up in 2 months.  [name], MD [facility] [place] Office: [phone] Pager: [phone]

## 2014-07-03 DIAGNOSIS — I509 Heart failure, unspecified: Secondary | ICD-10-CM | POA: Diagnosis not present

## 2014-07-03 DIAGNOSIS — E119 Type 2 diabetes mellitus without complications: Secondary | ICD-10-CM | POA: Diagnosis not present

## 2014-07-03 DIAGNOSIS — Z9181 History of falling: Secondary | ICD-10-CM | POA: Diagnosis not present

## 2014-07-03 DIAGNOSIS — I2510 Atherosclerotic heart disease of native coronary artery without angina pectoris: Secondary | ICD-10-CM | POA: Diagnosis not present

## 2014-07-03 DIAGNOSIS — Z794 Long term (current) use of insulin: Secondary | ICD-10-CM | POA: Diagnosis not present

## 2014-07-03 DIAGNOSIS — I872 Venous insufficiency (chronic) (peripheral): Secondary | ICD-10-CM | POA: Diagnosis not present

## 2014-07-03 DIAGNOSIS — G546 Phantom limb syndrome with pain: Secondary | ICD-10-CM | POA: Diagnosis not present

## 2014-07-03 DIAGNOSIS — I10 Essential (primary) hypertension: Secondary | ICD-10-CM | POA: Diagnosis not present

## 2014-07-03 DIAGNOSIS — T8789 Other complications of amputation stump: Secondary | ICD-10-CM | POA: Diagnosis not present

## 2014-07-05 DIAGNOSIS — T879 Unspecified complications of amputation stump: Secondary | ICD-10-CM | POA: Diagnosis not present

## 2014-07-05 DIAGNOSIS — S88011A Complete traumatic amputation at knee level, right lower leg, initial encounter: Secondary | ICD-10-CM | POA: Diagnosis not present

## 2014-07-05 DIAGNOSIS — T8189XA Other complications of procedures, not elsewhere classified, initial encounter: Secondary | ICD-10-CM | POA: Diagnosis not present

## 2014-07-05 DIAGNOSIS — Z89511 Acquired absence of right leg below knee: Secondary | ICD-10-CM | POA: Diagnosis not present

## 2014-07-07 DIAGNOSIS — I872 Venous insufficiency (chronic) (peripheral): Secondary | ICD-10-CM | POA: Diagnosis not present

## 2014-07-07 DIAGNOSIS — Z794 Long term (current) use of insulin: Secondary | ICD-10-CM | POA: Diagnosis not present

## 2014-07-07 DIAGNOSIS — Z9181 History of falling: Secondary | ICD-10-CM | POA: Diagnosis not present

## 2014-07-07 DIAGNOSIS — I10 Essential (primary) hypertension: Secondary | ICD-10-CM | POA: Diagnosis not present

## 2014-07-07 DIAGNOSIS — I2510 Atherosclerotic heart disease of native coronary artery without angina pectoris: Secondary | ICD-10-CM | POA: Diagnosis not present

## 2014-07-07 DIAGNOSIS — E119 Type 2 diabetes mellitus without complications: Secondary | ICD-10-CM | POA: Diagnosis not present

## 2014-07-07 DIAGNOSIS — G546 Phantom limb syndrome with pain: Secondary | ICD-10-CM | POA: Diagnosis not present

## 2014-07-07 DIAGNOSIS — I509 Heart failure, unspecified: Secondary | ICD-10-CM | POA: Diagnosis not present

## 2014-07-07 DIAGNOSIS — T8789 Other complications of amputation stump: Secondary | ICD-10-CM | POA: Diagnosis not present

## 2014-07-10 DIAGNOSIS — I872 Venous insufficiency (chronic) (peripheral): Secondary | ICD-10-CM | POA: Diagnosis not present

## 2014-07-10 DIAGNOSIS — G546 Phantom limb syndrome with pain: Secondary | ICD-10-CM | POA: Diagnosis not present

## 2014-07-10 DIAGNOSIS — Z794 Long term (current) use of insulin: Secondary | ICD-10-CM | POA: Diagnosis not present

## 2014-07-10 DIAGNOSIS — E119 Type 2 diabetes mellitus without complications: Secondary | ICD-10-CM | POA: Diagnosis not present

## 2014-07-10 DIAGNOSIS — I509 Heart failure, unspecified: Secondary | ICD-10-CM | POA: Diagnosis not present

## 2014-07-10 DIAGNOSIS — I10 Essential (primary) hypertension: Secondary | ICD-10-CM | POA: Diagnosis not present

## 2014-07-10 DIAGNOSIS — Z9181 History of falling: Secondary | ICD-10-CM | POA: Diagnosis not present

## 2014-07-10 DIAGNOSIS — I2510 Atherosclerotic heart disease of native coronary artery without angina pectoris: Secondary | ICD-10-CM | POA: Diagnosis not present

## 2014-07-10 DIAGNOSIS — T8789 Other complications of amputation stump: Secondary | ICD-10-CM | POA: Diagnosis not present

## 2014-07-12 ENCOUNTER — Encounter (HOSPITAL_BASED_OUTPATIENT_CLINIC_OR_DEPARTMENT_OTHER): Payer: Commercial Managed Care - HMO | Attending: General Surgery

## 2014-07-12 DIAGNOSIS — Y835 Amputation of limb(s) as the cause of abnormal reaction of the patient, or of later complication, without mention of misadventure at the time of the procedure: Secondary | ICD-10-CM | POA: Insufficient documentation

## 2014-07-12 DIAGNOSIS — T8781 Dehiscence of amputation stump: Secondary | ICD-10-CM | POA: Insufficient documentation

## 2014-07-12 DIAGNOSIS — S81801D Unspecified open wound, right lower leg, subsequent encounter: Secondary | ICD-10-CM | POA: Insufficient documentation

## 2014-07-15 DIAGNOSIS — E119 Type 2 diabetes mellitus without complications: Secondary | ICD-10-CM | POA: Diagnosis not present

## 2014-07-15 DIAGNOSIS — I10 Essential (primary) hypertension: Secondary | ICD-10-CM | POA: Diagnosis not present

## 2014-07-15 DIAGNOSIS — I872 Venous insufficiency (chronic) (peripheral): Secondary | ICD-10-CM | POA: Diagnosis not present

## 2014-07-15 DIAGNOSIS — T8789 Other complications of amputation stump: Secondary | ICD-10-CM | POA: Diagnosis not present

## 2014-07-15 DIAGNOSIS — Z9181 History of falling: Secondary | ICD-10-CM | POA: Diagnosis not present

## 2014-07-15 DIAGNOSIS — Z794 Long term (current) use of insulin: Secondary | ICD-10-CM | POA: Diagnosis not present

## 2014-07-15 DIAGNOSIS — I2510 Atherosclerotic heart disease of native coronary artery without angina pectoris: Secondary | ICD-10-CM | POA: Diagnosis not present

## 2014-07-15 DIAGNOSIS — G546 Phantom limb syndrome with pain: Secondary | ICD-10-CM | POA: Diagnosis not present

## 2014-07-15 DIAGNOSIS — I509 Heart failure, unspecified: Secondary | ICD-10-CM | POA: Diagnosis not present

## 2014-07-17 DIAGNOSIS — I872 Venous insufficiency (chronic) (peripheral): Secondary | ICD-10-CM | POA: Diagnosis not present

## 2014-07-17 DIAGNOSIS — E119 Type 2 diabetes mellitus without complications: Secondary | ICD-10-CM | POA: Diagnosis not present

## 2014-07-17 DIAGNOSIS — I10 Essential (primary) hypertension: Secondary | ICD-10-CM | POA: Diagnosis not present

## 2014-07-17 DIAGNOSIS — Z794 Long term (current) use of insulin: Secondary | ICD-10-CM | POA: Diagnosis not present

## 2014-07-17 DIAGNOSIS — Z9181 History of falling: Secondary | ICD-10-CM | POA: Diagnosis not present

## 2014-07-17 DIAGNOSIS — I509 Heart failure, unspecified: Secondary | ICD-10-CM | POA: Diagnosis not present

## 2014-07-17 DIAGNOSIS — T8789 Other complications of amputation stump: Secondary | ICD-10-CM | POA: Diagnosis not present

## 2014-07-17 DIAGNOSIS — G546 Phantom limb syndrome with pain: Secondary | ICD-10-CM | POA: Diagnosis not present

## 2014-07-17 DIAGNOSIS — I2510 Atherosclerotic heart disease of native coronary artery without angina pectoris: Secondary | ICD-10-CM | POA: Diagnosis not present

## 2014-07-19 DIAGNOSIS — S81801D Unspecified open wound, right lower leg, subsequent encounter: Secondary | ICD-10-CM | POA: Diagnosis not present

## 2014-07-19 DIAGNOSIS — T8781 Dehiscence of amputation stump: Secondary | ICD-10-CM | POA: Diagnosis not present

## 2014-07-21 DIAGNOSIS — G546 Phantom limb syndrome with pain: Secondary | ICD-10-CM | POA: Diagnosis not present

## 2014-07-21 DIAGNOSIS — I509 Heart failure, unspecified: Secondary | ICD-10-CM | POA: Diagnosis not present

## 2014-07-21 DIAGNOSIS — I10 Essential (primary) hypertension: Secondary | ICD-10-CM | POA: Diagnosis not present

## 2014-07-21 DIAGNOSIS — I2510 Atherosclerotic heart disease of native coronary artery without angina pectoris: Secondary | ICD-10-CM | POA: Diagnosis not present

## 2014-07-21 DIAGNOSIS — E119 Type 2 diabetes mellitus without complications: Secondary | ICD-10-CM | POA: Diagnosis not present

## 2014-07-21 DIAGNOSIS — T8789 Other complications of amputation stump: Secondary | ICD-10-CM | POA: Diagnosis not present

## 2014-07-21 DIAGNOSIS — I872 Venous insufficiency (chronic) (peripheral): Secondary | ICD-10-CM | POA: Diagnosis not present

## 2014-07-21 DIAGNOSIS — Z9181 History of falling: Secondary | ICD-10-CM | POA: Diagnosis not present

## 2014-07-21 DIAGNOSIS — Z794 Long term (current) use of insulin: Secondary | ICD-10-CM | POA: Diagnosis not present

## 2014-07-24 DIAGNOSIS — Z9181 History of falling: Secondary | ICD-10-CM | POA: Diagnosis not present

## 2014-07-24 DIAGNOSIS — Z794 Long term (current) use of insulin: Secondary | ICD-10-CM | POA: Diagnosis not present

## 2014-07-24 DIAGNOSIS — T8789 Other complications of amputation stump: Secondary | ICD-10-CM | POA: Diagnosis not present

## 2014-07-24 DIAGNOSIS — G546 Phantom limb syndrome with pain: Secondary | ICD-10-CM | POA: Diagnosis not present

## 2014-07-24 DIAGNOSIS — I509 Heart failure, unspecified: Secondary | ICD-10-CM | POA: Diagnosis not present

## 2014-07-24 DIAGNOSIS — I872 Venous insufficiency (chronic) (peripheral): Secondary | ICD-10-CM | POA: Diagnosis not present

## 2014-07-24 DIAGNOSIS — I2510 Atherosclerotic heart disease of native coronary artery without angina pectoris: Secondary | ICD-10-CM | POA: Diagnosis not present

## 2014-07-24 DIAGNOSIS — I10 Essential (primary) hypertension: Secondary | ICD-10-CM | POA: Diagnosis not present

## 2014-07-24 DIAGNOSIS — E119 Type 2 diabetes mellitus without complications: Secondary | ICD-10-CM | POA: Diagnosis not present

## 2014-07-26 DIAGNOSIS — S81801D Unspecified open wound, right lower leg, subsequent encounter: Secondary | ICD-10-CM | POA: Diagnosis not present

## 2014-07-26 DIAGNOSIS — T8781 Dehiscence of amputation stump: Secondary | ICD-10-CM | POA: Diagnosis not present

## 2014-07-28 DIAGNOSIS — G546 Phantom limb syndrome with pain: Secondary | ICD-10-CM | POA: Diagnosis not present

## 2014-07-28 DIAGNOSIS — Z794 Long term (current) use of insulin: Secondary | ICD-10-CM | POA: Diagnosis not present

## 2014-07-28 DIAGNOSIS — Z9181 History of falling: Secondary | ICD-10-CM | POA: Diagnosis not present

## 2014-07-28 DIAGNOSIS — I2510 Atherosclerotic heart disease of native coronary artery without angina pectoris: Secondary | ICD-10-CM | POA: Diagnosis not present

## 2014-07-28 DIAGNOSIS — T8789 Other complications of amputation stump: Secondary | ICD-10-CM | POA: Diagnosis not present

## 2014-07-28 DIAGNOSIS — I509 Heart failure, unspecified: Secondary | ICD-10-CM | POA: Diagnosis not present

## 2014-07-28 DIAGNOSIS — I872 Venous insufficiency (chronic) (peripheral): Secondary | ICD-10-CM | POA: Diagnosis not present

## 2014-07-28 DIAGNOSIS — I10 Essential (primary) hypertension: Secondary | ICD-10-CM | POA: Diagnosis not present

## 2014-07-28 DIAGNOSIS — E119 Type 2 diabetes mellitus without complications: Secondary | ICD-10-CM | POA: Diagnosis not present

## 2014-07-31 DIAGNOSIS — Z9181 History of falling: Secondary | ICD-10-CM | POA: Diagnosis not present

## 2014-07-31 DIAGNOSIS — Z794 Long term (current) use of insulin: Secondary | ICD-10-CM | POA: Diagnosis not present

## 2014-07-31 DIAGNOSIS — I872 Venous insufficiency (chronic) (peripheral): Secondary | ICD-10-CM | POA: Diagnosis not present

## 2014-07-31 DIAGNOSIS — I2510 Atherosclerotic heart disease of native coronary artery without angina pectoris: Secondary | ICD-10-CM | POA: Diagnosis not present

## 2014-07-31 DIAGNOSIS — E119 Type 2 diabetes mellitus without complications: Secondary | ICD-10-CM | POA: Diagnosis not present

## 2014-07-31 DIAGNOSIS — G546 Phantom limb syndrome with pain: Secondary | ICD-10-CM | POA: Diagnosis not present

## 2014-07-31 DIAGNOSIS — I10 Essential (primary) hypertension: Secondary | ICD-10-CM | POA: Diagnosis not present

## 2014-07-31 DIAGNOSIS — I509 Heart failure, unspecified: Secondary | ICD-10-CM | POA: Diagnosis not present

## 2014-07-31 DIAGNOSIS — T8789 Other complications of amputation stump: Secondary | ICD-10-CM | POA: Diagnosis not present

## 2014-08-02 DIAGNOSIS — T8781 Dehiscence of amputation stump: Secondary | ICD-10-CM | POA: Diagnosis not present

## 2014-08-02 DIAGNOSIS — S81801D Unspecified open wound, right lower leg, subsequent encounter: Secondary | ICD-10-CM | POA: Diagnosis not present

## 2014-08-04 DIAGNOSIS — I872 Venous insufficiency (chronic) (peripheral): Secondary | ICD-10-CM | POA: Diagnosis not present

## 2014-08-04 DIAGNOSIS — I2510 Atherosclerotic heart disease of native coronary artery without angina pectoris: Secondary | ICD-10-CM | POA: Diagnosis not present

## 2014-08-04 DIAGNOSIS — G546 Phantom limb syndrome with pain: Secondary | ICD-10-CM | POA: Diagnosis not present

## 2014-08-04 DIAGNOSIS — I509 Heart failure, unspecified: Secondary | ICD-10-CM | POA: Diagnosis not present

## 2014-08-04 DIAGNOSIS — Z794 Long term (current) use of insulin: Secondary | ICD-10-CM | POA: Diagnosis not present

## 2014-08-04 DIAGNOSIS — E119 Type 2 diabetes mellitus without complications: Secondary | ICD-10-CM | POA: Diagnosis not present

## 2014-08-04 DIAGNOSIS — Z9181 History of falling: Secondary | ICD-10-CM | POA: Diagnosis not present

## 2014-08-04 DIAGNOSIS — T8789 Other complications of amputation stump: Secondary | ICD-10-CM | POA: Diagnosis not present

## 2014-08-04 DIAGNOSIS — I10 Essential (primary) hypertension: Secondary | ICD-10-CM | POA: Diagnosis not present

## 2014-08-07 DIAGNOSIS — E119 Type 2 diabetes mellitus without complications: Secondary | ICD-10-CM | POA: Diagnosis not present

## 2014-08-07 DIAGNOSIS — Z9181 History of falling: Secondary | ICD-10-CM | POA: Diagnosis not present

## 2014-08-07 DIAGNOSIS — I872 Venous insufficiency (chronic) (peripheral): Secondary | ICD-10-CM | POA: Diagnosis not present

## 2014-08-07 DIAGNOSIS — Z794 Long term (current) use of insulin: Secondary | ICD-10-CM | POA: Diagnosis not present

## 2014-08-07 DIAGNOSIS — G546 Phantom limb syndrome with pain: Secondary | ICD-10-CM | POA: Diagnosis not present

## 2014-08-07 DIAGNOSIS — I509 Heart failure, unspecified: Secondary | ICD-10-CM | POA: Diagnosis not present

## 2014-08-07 DIAGNOSIS — I10 Essential (primary) hypertension: Secondary | ICD-10-CM | POA: Diagnosis not present

## 2014-08-07 DIAGNOSIS — T8789 Other complications of amputation stump: Secondary | ICD-10-CM | POA: Diagnosis not present

## 2014-08-07 DIAGNOSIS — I2510 Atherosclerotic heart disease of native coronary artery without angina pectoris: Secondary | ICD-10-CM | POA: Diagnosis not present

## 2014-08-09 ENCOUNTER — Encounter (HOSPITAL_BASED_OUTPATIENT_CLINIC_OR_DEPARTMENT_OTHER): Payer: Commercial Managed Care - HMO | Attending: General Surgery

## 2014-08-09 DIAGNOSIS — L97814 Non-pressure chronic ulcer of other part of right lower leg with necrosis of bone: Secondary | ICD-10-CM | POA: Insufficient documentation

## 2014-08-09 DIAGNOSIS — Z89511 Acquired absence of right leg below knee: Secondary | ICD-10-CM | POA: Diagnosis not present

## 2014-08-09 DIAGNOSIS — T8781 Dehiscence of amputation stump: Secondary | ICD-10-CM | POA: Diagnosis not present

## 2014-08-11 DIAGNOSIS — I2510 Atherosclerotic heart disease of native coronary artery without angina pectoris: Secondary | ICD-10-CM | POA: Diagnosis not present

## 2014-08-11 DIAGNOSIS — I509 Heart failure, unspecified: Secondary | ICD-10-CM | POA: Diagnosis not present

## 2014-08-11 DIAGNOSIS — I10 Essential (primary) hypertension: Secondary | ICD-10-CM | POA: Diagnosis not present

## 2014-08-11 DIAGNOSIS — E119 Type 2 diabetes mellitus without complications: Secondary | ICD-10-CM | POA: Diagnosis not present

## 2014-08-11 DIAGNOSIS — T8789 Other complications of amputation stump: Secondary | ICD-10-CM | POA: Diagnosis not present

## 2014-08-11 DIAGNOSIS — Z794 Long term (current) use of insulin: Secondary | ICD-10-CM | POA: Diagnosis not present

## 2014-08-11 DIAGNOSIS — G546 Phantom limb syndrome with pain: Secondary | ICD-10-CM | POA: Diagnosis not present

## 2014-08-11 DIAGNOSIS — I872 Venous insufficiency (chronic) (peripheral): Secondary | ICD-10-CM | POA: Diagnosis not present

## 2014-08-11 DIAGNOSIS — Z9181 History of falling: Secondary | ICD-10-CM | POA: Diagnosis not present

## 2014-08-14 DIAGNOSIS — Z794 Long term (current) use of insulin: Secondary | ICD-10-CM | POA: Diagnosis not present

## 2014-08-14 DIAGNOSIS — I10 Essential (primary) hypertension: Secondary | ICD-10-CM | POA: Diagnosis not present

## 2014-08-14 DIAGNOSIS — I2510 Atherosclerotic heart disease of native coronary artery without angina pectoris: Secondary | ICD-10-CM | POA: Diagnosis not present

## 2014-08-14 DIAGNOSIS — E119 Type 2 diabetes mellitus without complications: Secondary | ICD-10-CM | POA: Diagnosis not present

## 2014-08-14 DIAGNOSIS — I509 Heart failure, unspecified: Secondary | ICD-10-CM | POA: Diagnosis not present

## 2014-08-14 DIAGNOSIS — G546 Phantom limb syndrome with pain: Secondary | ICD-10-CM | POA: Diagnosis not present

## 2014-08-14 DIAGNOSIS — T8789 Other complications of amputation stump: Secondary | ICD-10-CM | POA: Diagnosis not present

## 2014-08-14 DIAGNOSIS — Z9181 History of falling: Secondary | ICD-10-CM | POA: Diagnosis not present

## 2014-08-14 DIAGNOSIS — I872 Venous insufficiency (chronic) (peripheral): Secondary | ICD-10-CM | POA: Diagnosis not present

## 2014-08-16 DIAGNOSIS — Z89511 Acquired absence of right leg below knee: Secondary | ICD-10-CM | POA: Diagnosis not present

## 2014-08-16 DIAGNOSIS — L97814 Non-pressure chronic ulcer of other part of right lower leg with necrosis of bone: Secondary | ICD-10-CM | POA: Diagnosis not present

## 2014-08-16 DIAGNOSIS — T8781 Dehiscence of amputation stump: Secondary | ICD-10-CM | POA: Diagnosis not present

## 2014-08-18 DIAGNOSIS — I10 Essential (primary) hypertension: Secondary | ICD-10-CM | POA: Diagnosis not present

## 2014-08-18 DIAGNOSIS — I2510 Atherosclerotic heart disease of native coronary artery without angina pectoris: Secondary | ICD-10-CM | POA: Diagnosis not present

## 2014-08-18 DIAGNOSIS — I509 Heart failure, unspecified: Secondary | ICD-10-CM | POA: Diagnosis not present

## 2014-08-18 DIAGNOSIS — Z9181 History of falling: Secondary | ICD-10-CM | POA: Diagnosis not present

## 2014-08-18 DIAGNOSIS — E119 Type 2 diabetes mellitus without complications: Secondary | ICD-10-CM | POA: Diagnosis not present

## 2014-08-18 DIAGNOSIS — G546 Phantom limb syndrome with pain: Secondary | ICD-10-CM | POA: Diagnosis not present

## 2014-08-18 DIAGNOSIS — Z794 Long term (current) use of insulin: Secondary | ICD-10-CM | POA: Diagnosis not present

## 2014-08-18 DIAGNOSIS — I872 Venous insufficiency (chronic) (peripheral): Secondary | ICD-10-CM | POA: Diagnosis not present

## 2014-08-18 DIAGNOSIS — T8789 Other complications of amputation stump: Secondary | ICD-10-CM | POA: Diagnosis not present

## 2014-08-21 DIAGNOSIS — Z794 Long term (current) use of insulin: Secondary | ICD-10-CM | POA: Diagnosis not present

## 2014-08-21 DIAGNOSIS — I509 Heart failure, unspecified: Secondary | ICD-10-CM | POA: Diagnosis not present

## 2014-08-21 DIAGNOSIS — I872 Venous insufficiency (chronic) (peripheral): Secondary | ICD-10-CM | POA: Diagnosis not present

## 2014-08-21 DIAGNOSIS — G546 Phantom limb syndrome with pain: Secondary | ICD-10-CM | POA: Diagnosis not present

## 2014-08-21 DIAGNOSIS — E119 Type 2 diabetes mellitus without complications: Secondary | ICD-10-CM | POA: Diagnosis not present

## 2014-08-21 DIAGNOSIS — Z9181 History of falling: Secondary | ICD-10-CM | POA: Diagnosis not present

## 2014-08-21 DIAGNOSIS — T8789 Other complications of amputation stump: Secondary | ICD-10-CM | POA: Diagnosis not present

## 2014-08-21 DIAGNOSIS — I2510 Atherosclerotic heart disease of native coronary artery without angina pectoris: Secondary | ICD-10-CM | POA: Diagnosis not present

## 2014-08-21 DIAGNOSIS — I10 Essential (primary) hypertension: Secondary | ICD-10-CM | POA: Diagnosis not present

## 2014-08-23 DIAGNOSIS — Z89511 Acquired absence of right leg below knee: Secondary | ICD-10-CM | POA: Diagnosis not present

## 2014-08-23 DIAGNOSIS — T8781 Dehiscence of amputation stump: Secondary | ICD-10-CM | POA: Diagnosis not present

## 2014-08-23 DIAGNOSIS — L97814 Non-pressure chronic ulcer of other part of right lower leg with necrosis of bone: Secondary | ICD-10-CM | POA: Diagnosis not present

## 2014-08-25 DIAGNOSIS — I2510 Atherosclerotic heart disease of native coronary artery without angina pectoris: Secondary | ICD-10-CM | POA: Diagnosis not present

## 2014-08-25 DIAGNOSIS — Z794 Long term (current) use of insulin: Secondary | ICD-10-CM | POA: Diagnosis not present

## 2014-08-25 DIAGNOSIS — Z9181 History of falling: Secondary | ICD-10-CM | POA: Diagnosis not present

## 2014-08-25 DIAGNOSIS — G546 Phantom limb syndrome with pain: Secondary | ICD-10-CM | POA: Diagnosis not present

## 2014-08-25 DIAGNOSIS — I509 Heart failure, unspecified: Secondary | ICD-10-CM | POA: Diagnosis not present

## 2014-08-25 DIAGNOSIS — I872 Venous insufficiency (chronic) (peripheral): Secondary | ICD-10-CM | POA: Diagnosis not present

## 2014-08-25 DIAGNOSIS — I10 Essential (primary) hypertension: Secondary | ICD-10-CM | POA: Diagnosis not present

## 2014-08-25 DIAGNOSIS — E119 Type 2 diabetes mellitus without complications: Secondary | ICD-10-CM | POA: Diagnosis not present

## 2014-08-25 DIAGNOSIS — T8789 Other complications of amputation stump: Secondary | ICD-10-CM | POA: Diagnosis not present

## 2014-08-28 DIAGNOSIS — G546 Phantom limb syndrome with pain: Secondary | ICD-10-CM | POA: Diagnosis not present

## 2014-08-28 DIAGNOSIS — T8789 Other complications of amputation stump: Secondary | ICD-10-CM | POA: Diagnosis not present

## 2014-08-28 DIAGNOSIS — E119 Type 2 diabetes mellitus without complications: Secondary | ICD-10-CM | POA: Diagnosis not present

## 2014-08-28 DIAGNOSIS — I872 Venous insufficiency (chronic) (peripheral): Secondary | ICD-10-CM | POA: Diagnosis not present

## 2014-08-28 DIAGNOSIS — I509 Heart failure, unspecified: Secondary | ICD-10-CM | POA: Diagnosis not present

## 2014-08-28 DIAGNOSIS — Z794 Long term (current) use of insulin: Secondary | ICD-10-CM | POA: Diagnosis not present

## 2014-08-28 DIAGNOSIS — Z9181 History of falling: Secondary | ICD-10-CM | POA: Diagnosis not present

## 2014-08-28 DIAGNOSIS — I2510 Atherosclerotic heart disease of native coronary artery without angina pectoris: Secondary | ICD-10-CM | POA: Diagnosis not present

## 2014-08-28 DIAGNOSIS — I10 Essential (primary) hypertension: Secondary | ICD-10-CM | POA: Diagnosis not present

## 2014-08-30 DIAGNOSIS — Z89511 Acquired absence of right leg below knee: Secondary | ICD-10-CM | POA: Diagnosis not present

## 2014-08-30 DIAGNOSIS — L97814 Non-pressure chronic ulcer of other part of right lower leg with necrosis of bone: Secondary | ICD-10-CM | POA: Diagnosis not present

## 2014-08-30 DIAGNOSIS — T8781 Dehiscence of amputation stump: Secondary | ICD-10-CM | POA: Diagnosis not present

## 2014-08-31 ENCOUNTER — Encounter: Payer: Self-pay | Admitting: Vascular Surgery

## 2014-09-02 ENCOUNTER — Encounter: Payer: Self-pay | Admitting: Vascular Surgery

## 2014-09-02 ENCOUNTER — Ambulatory Visit: Payer: Commercial Managed Care - HMO | Admitting: Vascular Surgery

## 2014-09-02 ENCOUNTER — Ambulatory Visit (INDEPENDENT_AMBULATORY_CARE_PROVIDER_SITE_OTHER): Payer: Commercial Managed Care - HMO | Admitting: Vascular Surgery

## 2014-09-02 VITALS — BP 131/67 | HR 87 | Resp 16 | Ht 69.0 in | Wt 290.0 lb

## 2014-09-02 DIAGNOSIS — L02419 Cutaneous abscess of limb, unspecified: Secondary | ICD-10-CM | POA: Diagnosis not present

## 2014-09-02 DIAGNOSIS — L03119 Cellulitis of unspecified part of limb: Secondary | ICD-10-CM

## 2014-09-02 DIAGNOSIS — Z89512 Acquired absence of left leg below knee: Secondary | ICD-10-CM | POA: Diagnosis not present

## 2014-09-02 DIAGNOSIS — Z89511 Acquired absence of right leg below knee: Secondary | ICD-10-CM | POA: Diagnosis not present

## 2014-09-02 NOTE — Progress Notes (Signed)
    Established Critical Limb Ischemia Patient  History of Present Illness  Leroy [name] is a 69 y.o. (18-Mar-1946) male who presents with chief complaint: nearly healed R BKA.  The patient denies any rest pain sx or pain in his B BKA.  His most recent procedure was a debridement of R BKA stump and VAC placement.  He has nearly healed his R BKA with a <1 cm defect still present.  The patient's treatment regimen currently included: maximal medical management.  He is wheelchair dependent but wears his L BKA prosthesis to help him move in his wheelchair.  The patient's PMH, PSH, SH, FamHx, Med, and Allergies are unchanged from 1/13/416.  On ROS today: no drainage from R BKA, no fever or chills, no rest pain  Physical Examination  Filed Vitals:   09/02/14 1100  BP: 131/67  Pulse: 87  Resp: 16  Height: 5\' 9"  (1.753 m)  Weight: 290 lb (131.543 kg)  SpO2: 97%   Body mass index is 42.81 kg/(m^2).  General: A&O x 3, WDWN  Eyes: PERRLA, EOMI  Pulmonary: Sym exp, good air movt, CTAB, no rales, rhonchi, & wheezing  Cardiac: RRR, Nl S1, S2, no Murmurs, rubs or gallops  Vascular: Vessel Right Left  Radial Palpable Palpable  Brachial Palpable Palpable  Carotid Palpable, without bruit Palpable, without bruit  Aorta Not palpable N/A  Femoral Palpable Palpable  Popliteal Not palpable Not palpable  PT BKA BKA  DP BKA BKA   Gastrointestinal: soft, NTND, -G/R, - HSM, - masses, - CVAT B  Musculoskeletal: M/S 5/5 throughout BUE, able to move both BKA spontaneous, B BKA, R BKA nearly healed except for <1 cm skin defect, drainage  Neurologic: Pain and light touch intact in extremities , Motor exam as listed above   Medical Decision Making  Leroy [name] is a 69 y.o. male who presents with: BLE critical limb ischemia s/p B BKA   Nothing more to offer this patient other than more proximal amputations which he is not interested in.  The wound in R BKA is nearly healed despite my  concerns that his BKA stump blood flow is limited.  I am referring him for prosthetic evaluation and fitting.  The patient can follow up US as needed.  Thank you for allowing [place] to participate in this patient's care.  [name], MD [facility] [place] Office: [phone] Pager: [phone]  09/02/2014, 11:59 AM

## 2014-09-04 DIAGNOSIS — I10 Essential (primary) hypertension: Secondary | ICD-10-CM | POA: Diagnosis not present

## 2014-09-04 DIAGNOSIS — I509 Heart failure, unspecified: Secondary | ICD-10-CM | POA: Diagnosis not present

## 2014-09-04 DIAGNOSIS — Z794 Long term (current) use of insulin: Secondary | ICD-10-CM | POA: Diagnosis not present

## 2014-09-04 DIAGNOSIS — I872 Venous insufficiency (chronic) (peripheral): Secondary | ICD-10-CM | POA: Diagnosis not present

## 2014-09-04 DIAGNOSIS — Z9181 History of falling: Secondary | ICD-10-CM | POA: Diagnosis not present

## 2014-09-04 DIAGNOSIS — E119 Type 2 diabetes mellitus without complications: Secondary | ICD-10-CM | POA: Diagnosis not present

## 2014-09-04 DIAGNOSIS — T8789 Other complications of amputation stump: Secondary | ICD-10-CM | POA: Diagnosis not present

## 2014-09-04 DIAGNOSIS — G546 Phantom limb syndrome with pain: Secondary | ICD-10-CM | POA: Diagnosis not present

## 2014-09-04 DIAGNOSIS — I2510 Atherosclerotic heart disease of native coronary artery without angina pectoris: Secondary | ICD-10-CM | POA: Diagnosis not present

## 2014-09-06 DIAGNOSIS — T8781 Dehiscence of amputation stump: Secondary | ICD-10-CM | POA: Diagnosis not present

## 2014-09-06 DIAGNOSIS — Z89511 Acquired absence of right leg below knee: Secondary | ICD-10-CM | POA: Diagnosis not present

## 2014-09-06 DIAGNOSIS — L97814 Non-pressure chronic ulcer of other part of right lower leg with necrosis of bone: Secondary | ICD-10-CM | POA: Diagnosis not present

## 2014-09-11 DIAGNOSIS — I2510 Atherosclerotic heart disease of native coronary artery without angina pectoris: Secondary | ICD-10-CM | POA: Diagnosis not present

## 2014-09-11 DIAGNOSIS — G546 Phantom limb syndrome with pain: Secondary | ICD-10-CM | POA: Diagnosis not present

## 2014-09-11 DIAGNOSIS — Z9181 History of falling: Secondary | ICD-10-CM | POA: Diagnosis not present

## 2014-09-11 DIAGNOSIS — E119 Type 2 diabetes mellitus without complications: Secondary | ICD-10-CM | POA: Diagnosis not present

## 2014-09-11 DIAGNOSIS — T8789 Other complications of amputation stump: Secondary | ICD-10-CM | POA: Diagnosis not present

## 2014-09-11 DIAGNOSIS — I872 Venous insufficiency (chronic) (peripheral): Secondary | ICD-10-CM | POA: Diagnosis not present

## 2014-09-11 DIAGNOSIS — Z794 Long term (current) use of insulin: Secondary | ICD-10-CM | POA: Diagnosis not present

## 2014-09-11 DIAGNOSIS — I10 Essential (primary) hypertension: Secondary | ICD-10-CM | POA: Diagnosis not present

## 2014-09-11 DIAGNOSIS — I509 Heart failure, unspecified: Secondary | ICD-10-CM | POA: Diagnosis not present

## 2014-09-13 ENCOUNTER — Encounter (HOSPITAL_BASED_OUTPATIENT_CLINIC_OR_DEPARTMENT_OTHER): Payer: Commercial Managed Care - HMO | Attending: General Surgery

## 2014-09-13 DIAGNOSIS — L97811 Non-pressure chronic ulcer of other part of right lower leg limited to breakdown of skin: Secondary | ICD-10-CM | POA: Diagnosis not present

## 2014-09-13 DIAGNOSIS — Z89511 Acquired absence of right leg below knee: Secondary | ICD-10-CM | POA: Diagnosis not present

## 2014-09-13 DIAGNOSIS — T8781 Dehiscence of amputation stump: Secondary | ICD-10-CM | POA: Insufficient documentation

## 2014-09-15 DIAGNOSIS — Z794 Long term (current) use of insulin: Secondary | ICD-10-CM | POA: Diagnosis not present

## 2014-09-15 DIAGNOSIS — I509 Heart failure, unspecified: Secondary | ICD-10-CM | POA: Diagnosis not present

## 2014-09-15 DIAGNOSIS — Z9181 History of falling: Secondary | ICD-10-CM | POA: Diagnosis not present

## 2014-09-15 DIAGNOSIS — T8789 Other complications of amputation stump: Secondary | ICD-10-CM | POA: Diagnosis not present

## 2014-09-15 DIAGNOSIS — I10 Essential (primary) hypertension: Secondary | ICD-10-CM | POA: Diagnosis not present

## 2014-09-15 DIAGNOSIS — I872 Venous insufficiency (chronic) (peripheral): Secondary | ICD-10-CM | POA: Diagnosis not present

## 2014-09-15 DIAGNOSIS — E119 Type 2 diabetes mellitus without complications: Secondary | ICD-10-CM | POA: Diagnosis not present

## 2014-09-15 DIAGNOSIS — I2510 Atherosclerotic heart disease of native coronary artery without angina pectoris: Secondary | ICD-10-CM | POA: Diagnosis not present

## 2014-09-15 DIAGNOSIS — G546 Phantom limb syndrome with pain: Secondary | ICD-10-CM | POA: Diagnosis not present

## 2014-09-18 DIAGNOSIS — G546 Phantom limb syndrome with pain: Secondary | ICD-10-CM | POA: Diagnosis not present

## 2014-09-18 DIAGNOSIS — I872 Venous insufficiency (chronic) (peripheral): Secondary | ICD-10-CM | POA: Diagnosis not present

## 2014-09-18 DIAGNOSIS — I509 Heart failure, unspecified: Secondary | ICD-10-CM | POA: Diagnosis not present

## 2014-09-18 DIAGNOSIS — I2510 Atherosclerotic heart disease of native coronary artery without angina pectoris: Secondary | ICD-10-CM | POA: Diagnosis not present

## 2014-09-18 DIAGNOSIS — Z9181 History of falling: Secondary | ICD-10-CM | POA: Diagnosis not present

## 2014-09-18 DIAGNOSIS — E119 Type 2 diabetes mellitus without complications: Secondary | ICD-10-CM | POA: Diagnosis not present

## 2014-09-18 DIAGNOSIS — T8789 Other complications of amputation stump: Secondary | ICD-10-CM | POA: Diagnosis not present

## 2014-09-18 DIAGNOSIS — Z794 Long term (current) use of insulin: Secondary | ICD-10-CM | POA: Diagnosis not present

## 2014-09-18 DIAGNOSIS — I10 Essential (primary) hypertension: Secondary | ICD-10-CM | POA: Diagnosis not present

## 2014-09-22 DIAGNOSIS — E119 Type 2 diabetes mellitus without complications: Secondary | ICD-10-CM | POA: Diagnosis not present

## 2014-09-22 DIAGNOSIS — T8789 Other complications of amputation stump: Secondary | ICD-10-CM | POA: Diagnosis not present

## 2014-09-22 DIAGNOSIS — Z794 Long term (current) use of insulin: Secondary | ICD-10-CM | POA: Diagnosis not present

## 2014-09-22 DIAGNOSIS — I509 Heart failure, unspecified: Secondary | ICD-10-CM | POA: Diagnosis not present

## 2014-09-22 DIAGNOSIS — I2510 Atherosclerotic heart disease of native coronary artery without angina pectoris: Secondary | ICD-10-CM | POA: Diagnosis not present

## 2014-09-22 DIAGNOSIS — G546 Phantom limb syndrome with pain: Secondary | ICD-10-CM | POA: Diagnosis not present

## 2014-09-22 DIAGNOSIS — Z9181 History of falling: Secondary | ICD-10-CM | POA: Diagnosis not present

## 2014-09-22 DIAGNOSIS — I10 Essential (primary) hypertension: Secondary | ICD-10-CM | POA: Diagnosis not present

## 2014-09-22 DIAGNOSIS — I872 Venous insufficiency (chronic) (peripheral): Secondary | ICD-10-CM | POA: Diagnosis not present

## 2014-09-25 DIAGNOSIS — Z794 Long term (current) use of insulin: Secondary | ICD-10-CM | POA: Diagnosis not present

## 2014-09-25 DIAGNOSIS — I10 Essential (primary) hypertension: Secondary | ICD-10-CM | POA: Diagnosis not present

## 2014-09-25 DIAGNOSIS — E119 Type 2 diabetes mellitus without complications: Secondary | ICD-10-CM | POA: Diagnosis not present

## 2014-09-25 DIAGNOSIS — T8789 Other complications of amputation stump: Secondary | ICD-10-CM | POA: Diagnosis not present

## 2014-09-25 DIAGNOSIS — I872 Venous insufficiency (chronic) (peripheral): Secondary | ICD-10-CM | POA: Diagnosis not present

## 2014-09-25 DIAGNOSIS — G546 Phantom limb syndrome with pain: Secondary | ICD-10-CM | POA: Diagnosis not present

## 2014-09-25 DIAGNOSIS — I2510 Atherosclerotic heart disease of native coronary artery without angina pectoris: Secondary | ICD-10-CM | POA: Diagnosis not present

## 2014-09-25 DIAGNOSIS — Z9181 History of falling: Secondary | ICD-10-CM | POA: Diagnosis not present

## 2014-09-25 DIAGNOSIS — I509 Heart failure, unspecified: Secondary | ICD-10-CM | POA: Diagnosis not present

## 2014-09-26 ENCOUNTER — Other Ambulatory Visit (INDEPENDENT_AMBULATORY_CARE_PROVIDER_SITE_OTHER): Payer: Commercial Managed Care - HMO

## 2014-09-26 ENCOUNTER — Ambulatory Visit (INDEPENDENT_AMBULATORY_CARE_PROVIDER_SITE_OTHER): Payer: Commercial Managed Care - HMO | Admitting: Internal Medicine

## 2014-09-26 ENCOUNTER — Encounter: Payer: Self-pay | Admitting: Internal Medicine

## 2014-09-26 VITALS — BP 118/78 | HR 88 | Temp 98.2°F | Resp 16

## 2014-09-26 DIAGNOSIS — E118 Type 2 diabetes mellitus with unspecified complications: Secondary | ICD-10-CM

## 2014-09-26 DIAGNOSIS — E785 Hyperlipidemia, unspecified: Secondary | ICD-10-CM

## 2014-09-26 DIAGNOSIS — G546 Phantom limb syndrome with pain: Secondary | ICD-10-CM

## 2014-09-26 DIAGNOSIS — I5022 Chronic systolic (congestive) heart failure: Secondary | ICD-10-CM | POA: Diagnosis not present

## 2014-09-26 DIAGNOSIS — I7025 Atherosclerosis of native arteries of other extremities with ulceration: Secondary | ICD-10-CM

## 2014-09-26 DIAGNOSIS — E1165 Type 2 diabetes mellitus with hyperglycemia: Secondary | ICD-10-CM

## 2014-09-26 DIAGNOSIS — IMO0002 Reserved for concepts with insufficient information to code with codable children: Secondary | ICD-10-CM

## 2014-09-26 DIAGNOSIS — I480 Paroxysmal atrial fibrillation: Secondary | ICD-10-CM

## 2014-09-26 DIAGNOSIS — I2510 Atherosclerotic heart disease of native coronary artery without angina pectoris: Secondary | ICD-10-CM | POA: Diagnosis not present

## 2014-09-26 DIAGNOSIS — E119 Type 2 diabetes mellitus without complications: Secondary | ICD-10-CM

## 2014-09-26 LAB — CBC WITH DIFFERENTIAL/PLATELET
BASOS ABS: 0 10*3/uL (ref 0.0–0.1)
BASOS PCT: 0.5 % (ref 0.0–3.0)
EOS PCT: 1.5 % (ref 0.0–5.0)
Eosinophils Absolute: 0.1 10*3/uL (ref 0.0–0.7)
HEMATOCRIT: 48.7 % (ref 39.0–52.0)
Hemoglobin: 16.7 g/dL (ref 13.0–17.0)
Lymphocytes Relative: 31.4 % (ref 12.0–46.0)
Lymphs Abs: 2.3 10*3/uL (ref 0.7–4.0)
MCHC: 34.3 g/dL (ref 30.0–36.0)
MCV: 89.6 fl (ref 78.0–100.0)
Monocytes Absolute: 0.6 10*3/uL (ref 0.1–1.0)
Monocytes Relative: 7.8 % (ref 3.0–12.0)
NEUTROS PCT: 58.8 % (ref 43.0–77.0)
Neutro Abs: 4.3 10*3/uL (ref 1.4–7.7)
Platelets: 190 10*3/uL (ref 150.0–400.0)
RBC: 5.44 Mil/uL (ref 4.22–5.81)
RDW: 13.8 % (ref 11.5–15.5)
WBC: 7.4 10*3/uL (ref 4.0–10.5)

## 2014-09-26 LAB — BASIC METABOLIC PANEL
BUN: 11 mg/dL (ref 6–23)
CHLORIDE: 101 meq/L (ref 96–112)
CO2: 26 meq/L (ref 19–32)
Calcium: 8.7 mg/dL (ref 8.4–10.5)
Creatinine, Ser: 1.33 mg/dL (ref 0.40–1.50)
GFR: 68.83 mL/min (ref >60.00)
Glucose, Bld: 332 mg/dL — ABNORMAL HIGH (ref 70–99)
Potassium: 3.9 mEq/L (ref 3.5–5.1)
SODIUM: 133 meq/L — AB (ref 135–145)

## 2014-09-26 LAB — LIPID PANEL
CHOL/HDL RATIO: 7
Cholesterol: 292 mg/dL — ABNORMAL HIGH (ref 0–200)
HDL: 42.7 mg/dL (ref >39.00)
NonHDL: 249.3
Triglycerides: 209 mg/dL — ABNORMAL HIGH (ref 0.0–149.0)
VLDL: 41.8 mg/dL — AB (ref 0.0–40.0)

## 2014-09-26 LAB — MICROALBUMIN / CREATININE URINE RATIO
Creatinine,U: 161 mg/dL
MICROALB UR: 77.6 mg/dL — AB (ref 0.0–1.9)
Microalb Creat Ratio: 48.2 mg/g — ABNORMAL HIGH (ref 0.0–30.0)

## 2014-09-26 LAB — HEMOGLOBIN A1C: Hgb A1c MFr Bld: 11.5 % — ABNORMAL HIGH (ref 4.6–6.5)

## 2014-09-26 LAB — LDL CHOLESTEROL, DIRECT: Direct LDL: 209 mg/dL

## 2014-09-26 MED ORDER — METFORMIN HCL 500 MG PO TABS: 500.0000 mg | ORAL_TABLET | Freq: Two times a day (BID) | ORAL | Status: DC

## 2014-09-26 MED ORDER — LISINOPRIL 5 MG PO TABS: 5.0000 mg | ORAL_TABLET | Freq: Every day | ORAL | Status: DC

## 2014-09-26 MED ORDER — INSULIN GLARGINE 100 UNIT/ML ~~LOC~~ SOLN: 45.0000 [IU] | Freq: Two times a day (BID) | SUBCUTANEOUS | Status: DC

## 2014-09-26 MED ORDER — INSULIN ASPART 100 UNIT/ML ~~LOC~~ SOLN: 5.0000 [IU] | Freq: Three times a day (TID) | SUBCUTANEOUS | Status: DC | PRN

## 2014-09-26 MED ORDER — RIVAROXABAN 20 MG PO TABS: 20.0000 mg | ORAL_TABLET | Freq: Every day | ORAL | Status: DC

## 2014-09-26 NOTE — Progress Notes (Signed)
Subjective:  Patient ID: Leroy [name], male    DOB: 08/02/1945  Age: 69 y.o. MRN: [id number]  CC: Diabetes   HPI Leroy [name] presents for follow up on DM2. His blood sugars have been over 200 for the last few weeks so he has been giving himself extra doses of novolog. He does not have any s/s of hyperglycemia though.  Outpatient Prescriptions Prior to Visit  Medication Sig Dispense Refill  . Alcohol Swabs (B-D SINGLE USE SWABS REGULAR) PADS     . Blood Glucose Calibration (ACCU-CHEK SMARTVIEW CONTROL) LIQD Test up to TID dx: 250.62 1 each 3  . Blood Glucose Monitoring Suppl (ACCU-CHEK NANO SMARTVIEW) W/DEVICE KIT Test up to TID dx: 250.62 1 kit 1  . ezetimibe (ZETIA) 10 MG tablet Take 1 tablet (10 mg total) by mouth daily. 30 tablet 1  . glimepiride (AMARYL) 4 MG tablet Take 2 tablets (8 mg total) by mouth daily with breakfast. 180 tablet 3  . glucose blood (ACCU-CHEK SMARTVIEW) test strip Test up to TID dx: 250.62 300 each 4  . Lancets Misc. (ACCU-CHEK FASTCLIX LANCET) KIT Test up to TID dx: 250.62 1 kit 3  . metoprolol succinate (TOPROL-XL) 50 MG 24 hr tablet Take 1 tablet (50 mg total) by mouth 2 (two) times daily. Take with or immediately following a meal. 60 tablet 0  . nitroGLYCERIN (NITROSTAT) 0.4 MG SL tablet Place 1 tablet (0.4 mg total) under the tongue every 5 (five) minutes as needed for chest pain. 30 tablet 12  . PRODIGY NO CODING BLOOD GLUC test strip     . insulin aspart (NOVOLOG) 100 UNIT/ML injection Inject 5 Units into the skin 3 (three) times daily with meals. (Patient taking differently: Inject 5 Units into the skin 3 (three) times daily as needed for high blood sugar. ) 10 mL 11  . insulin glargine (LANTUS) 100 UNIT/ML injection Inject 0.45 mLs (45 Units total) into the skin 2 (two) times daily. 10 mL 11  . lisinopril (PRINIVIL,ZESTRIL) 5 MG tablet Take 1 tablet (5 mg total) by mouth daily. 30 tablet 11  . metFORMIN (GLUCOPHAGE) 500 MG tablet Take 1 tablet (500  mg total) by mouth 2 (two) times daily with a meal. 60 tablet 11  . rivaroxaban (XARELTO) 20 MG TABS tablet Take 1 tablet (20 mg total) by mouth daily at 6 PM. 30 tablet 5  . acetaminophen (TYLENOL) 325 MG tablet Take 2 tablets (650 mg total) by mouth every 6 (six) hours as needed for mild pain or fever. 30 tablet 0  . Alcohol Swabs (B-D SINGLE USE SWABS REGULAR) PADS Test up to TID dx: 250.62 300 each 4  . doxycycline (DORYX) 100 MG DR capsule Take 100 mg by mouth 2 (two) times daily.    . metoprolol succinate (TOPROL-XL) 50 MG 24 hr tablet TAKE ONE TABLET BY MOUTH TWICE DAILY 60 tablet 11  . oxyCODONE-acetaminophen (PERCOCET/ROXICET) 5-325 MG per tablet Take 1 tablet by mouth every 6 (six) hours as needed for moderate pain. (Patient not taking: Reported on 09/26/2014) 15 tablet 0  . pantoprazole (PROTONIX) 40 MG tablet Take 1 tablet (40 mg total) by mouth daily. 30 tablet 5   No facility-administered medications prior to visit.    ROS Review of Systems  Constitutional: Negative.  Negative for fever, chills, diaphoresis, appetite change and fatigue.  HENT: Negative.   Eyes: Negative.   Respiratory: Negative.  Negative for cough, choking, chest tightness, shortness of breath and stridor.  Cardiovascular: Negative.  Negative for chest pain, palpitations and leg swelling.  Gastrointestinal: Negative.  Negative for nausea, vomiting, abdominal pain, diarrhea, constipation and blood in stool.  Endocrine: Negative.  Negative for polydipsia, polyphagia and polyuria.  Genitourinary: Negative.   Musculoskeletal: Negative.   Skin: Negative.  Negative for rash.  Allergic/Immunologic: Negative.   Neurological: Negative.  Negative for dizziness, tremors, weakness, light-headedness, numbness and headaches.  Hematological: Negative.  Negative for adenopathy. Does not bruise/bleed easily.  Psychiatric/Behavioral: Negative.     Objective:  BP 118/78 mmHg  Pulse 88  Temp(Src) 98.2 F (36.8 C) (Oral)   Resp 16  SpO2 95%  BP Readings from Last 3 Encounters:  09/26/14 118/78  09/02/14 131/67  06/30/14 129/48    Wt Readings from Last 3 Encounters:  09/02/14 290 lb (131.543 kg)  06/30/14 292 lb (132.45 kg)  06/10/14 292 lb (132.45 kg)    Physical Exam  Constitutional: He is oriented to person, place, and time. He appears well-developed and well-nourished. No distress.  HENT:  Head: Normocephalic and atraumatic.  Mouth/Throat: Oropharynx is clear and moist. No oropharyngeal exudate.  Eyes: Conjunctivae are normal. Right eye exhibits no discharge. Left eye exhibits no discharge. No scleral icterus.  Neck: Normal range of motion. Neck supple. No JVD present. No tracheal deviation present. No thyromegaly present.  Cardiovascular: Normal rate, regular rhythm, normal heart sounds and intact distal pulses.  Exam reveals no gallop and no friction rub.   No murmur heard. Pulmonary/Chest: Effort normal and breath sounds normal. No stridor. No respiratory distress. He has no wheezes. He has no rales. He exhibits no tenderness.  Abdominal: Soft. Bowel sounds are normal. He exhibits no distension and no mass. There is no tenderness. There is no rebound and no guarding.  Musculoskeletal: Normal range of motion. He exhibits no edema or tenderness.  Lymphadenopathy:    He has no cervical adenopathy.  Neurological: He is oriented to person, place, and time.  Skin: Skin is warm and dry. No rash noted. He is not diaphoretic. No erythema. No pallor.  Psychiatric: He has a normal mood and affect. His behavior is normal. Judgment and thought content normal.  Vitals reviewed.   Lab Results  Component Value Date   WBC 5.8 04/22/2014   HGB 14.5 04/22/2014   HCT 42.8 04/22/2014   PLT 162 04/22/2014   GLUCOSE 116* 04/22/2014   CHOL 214* 01/28/2014   TRIG 101.0 01/28/2014   HDL 38.50* 01/28/2014   LDLDIRECT 206.8 01/06/2013   LDLCALC 155* 01/28/2014   ALT 13 04/20/2014   AST 15 04/20/2014   NA  138 04/22/2014   K 3.8 04/22/2014   CL 106 04/22/2014   CREATININE 1.07 04/22/2014   BUN 9 04/22/2014   CO2 23 04/22/2014   TSH 1.08 01/28/2014   INR 1.17 04/12/2013   HGBA1C 8.3* 01/28/2014    No results found.  Assessment & Plan:   Arlind was seen today for diabetes.  Diagnoses and all orders for this visit:  Type II diabetes mellitus with manifestations, uncontrolled - I will recheck his A1C and have asked him to see ENDO for further evaluation and treatment of his hyperglycemia Orders: -     metFORMIN (GLUCOPHAGE) 500 MG tablet; Take 1 tablet (500 mg total) by mouth 2 (two) times daily with a meal. -     Discontinue: lisinopril (PRINIVIL,ZESTRIL) 5 MG tablet; Take 1 tablet (5 mg total) by mouth daily. -     insulin aspart (NOVOLOG) 100 UNIT/ML injection;  Inject 5 Units into the skin 3 (three) times daily as needed for high blood sugar. -     insulin glargine (LANTUS) 100 UNIT/ML injection; Inject 0.45 mLs (45 Units total) into the skin 2 (two) times daily. -     Microalbumin / creatinine urine ratio; Future -     CBC with Differential/Platelet; Future -     Basic metabolic panel; Future -     Hemoglobin A1c; Future -     Ambulatory referral to Ophthalmology -     Ambulatory referral to Endocrinology -     lisinopril (PRINIVIL,ZESTRIL) 5 MG tablet; Take 1 tablet (5 mg total) by mouth daily.  Chronic systolic heart failure - he has a normal volume status today, will restart the ACEI Orders: -     Discontinue: lisinopril (PRINIVIL,ZESTRIL) 5 MG tablet; Take 1 tablet (5 mg total) by mouth daily. -     CBC with Differential/Platelet; Future -     lisinopril (PRINIVIL,ZESTRIL) 5 MG tablet; Take 1 tablet (5 mg total) by mouth daily.  Atherosclerosis of native coronary artery of native heart without angina pectoris Orders: -     Discontinue: lisinopril (PRINIVIL,ZESTRIL) 5 MG tablet; Take 1 tablet (5 mg total) by mouth daily. -     lisinopril (PRINIVIL,ZESTRIL) 5 MG tablet;  Take 1 tablet (5 mg total) by mouth daily.  Phantom limb pain  Hyperlipidemia with target LDL less than 70 - he will not take a statin Orders: -     Lipid panel; Future  Paroxysmal atrial fibrillation - he has no s/s related to this, will cont xarelto Orders: -     rivaroxaban (XARELTO) 20 MG TABS tablet; Take 1 tablet (20 mg total) by mouth daily at 6 PM.  Atherosclerosis of native arteries of the extremities with ulceration Orders: -     Discontinue: lisinopril (PRINIVIL,ZESTRIL) 5 MG tablet; Take 1 tablet (5 mg total) by mouth daily. -     lisinopril (PRINIVIL,ZESTRIL) 5 MG tablet; Take 1 tablet (5 mg total) by mouth daily.   I have discontinued Mr. Goheen's pantoprazole, doxycycline, and oxyCODONE-acetaminophen. I have also changed his insulin aspart. Additionally, I am having him maintain his acetaminophen, ezetimibe, metoprolol succinate, nitroGLYCERIN, glimepiride, B-D SINGLE USE SWABS REGULAR, PRODIGY NO CODING BLOOD GLUC, ACCU-CHEK NANO SMARTVIEW, ACCU-CHEK SMARTVIEW CONTROL, glucose blood, ACCU-CHEK FASTCLIX LANCET, rivaroxaban, metFORMIN, insulin glargine, and lisinopril.  Meds ordered this encounter  Medications  . rivaroxaban (XARELTO) 20 MG TABS tablet    Sig: Take 1 tablet (20 mg total) by mouth daily at 6 PM.    Dispense:  30 tablet    Refill:  5  . metFORMIN (GLUCOPHAGE) 500 MG tablet    Sig: Take 1 tablet (500 mg total) by mouth 2 (two) times daily with a meal.    Dispense:  60 tablet    Refill:  11  . DISCONTD: lisinopril (PRINIVIL,ZESTRIL) 5 MG tablet    Sig: Take 1 tablet (5 mg total) by mouth daily.    Dispense:  30 tablet    Refill:  11  . insulin aspart (NOVOLOG) 100 UNIT/ML injection    Sig: Inject 5 Units into the skin 3 (three) times daily as needed for high blood sugar.    Dispense:  10 mL    Refill:  11  . insulin glargine (LANTUS) 100 UNIT/ML injection    Sig: Inject 0.45 mLs (45 Units total) into the skin 2 (two) times daily.    Dispense:  10 mL  Refill:  11  . lisinopril (PRINIVIL,ZESTRIL) 5 MG tablet    Sig: Take 1 tablet (5 mg total) by mouth daily.    Dispense:  30 tablet    Refill:  11     Follow-up: Return in about 4 months (around 01/26/2015).  [name], MD

## 2014-09-26 NOTE — Progress Notes (Signed)
Pre visit review using our clinic review tool, if applicable. No additional management support is needed unless otherwise documented below in the visit note. 

## 2014-09-27 DIAGNOSIS — Z89511 Acquired absence of right leg below knee: Secondary | ICD-10-CM | POA: Diagnosis not present

## 2014-09-27 DIAGNOSIS — T8781 Dehiscence of amputation stump: Secondary | ICD-10-CM | POA: Diagnosis not present

## 2014-09-27 DIAGNOSIS — L97811 Non-pressure chronic ulcer of other part of right lower leg limited to breakdown of skin: Secondary | ICD-10-CM | POA: Diagnosis not present

## 2014-09-28 ENCOUNTER — Other Ambulatory Visit: Payer: Self-pay

## 2014-09-28 DIAGNOSIS — E118 Type 2 diabetes mellitus with unspecified complications: Principal | ICD-10-CM

## 2014-09-28 DIAGNOSIS — I2510 Atherosclerotic heart disease of native coronary artery without angina pectoris: Secondary | ICD-10-CM

## 2014-09-28 DIAGNOSIS — I7025 Atherosclerosis of native arteries of other extremities with ulceration: Secondary | ICD-10-CM

## 2014-09-28 DIAGNOSIS — E1165 Type 2 diabetes mellitus with hyperglycemia: Secondary | ICD-10-CM

## 2014-09-28 DIAGNOSIS — IMO0002 Reserved for concepts with insufficient information to code with codable children: Secondary | ICD-10-CM

## 2014-09-28 DIAGNOSIS — I5022 Chronic systolic (congestive) heart failure: Secondary | ICD-10-CM

## 2014-09-28 MED ORDER — LISINOPRIL 5 MG PO TABS: 5.0000 mg | ORAL_TABLET | Freq: Every day | ORAL | Status: DC

## 2014-09-28 MED ORDER — "INSULIN SYRINGE-NEEDLE U-100 31G X 5/16"" 0.3 ML MISC": Status: DC

## 2014-09-28 MED ORDER — METOPROLOL SUCCINATE ER 50 MG PO TB24: 50.0000 mg | ORAL_TABLET | Freq: Two times a day (BID) | ORAL | Status: DC

## 2014-09-28 NOTE — Telephone Encounter (Signed)
Received refill request from [facility]   request refills for Metoprolol Succ ER 50 mg tablet and Lisinopril 5 mg tablet . Rx last written 05/07/2013 and 09/26/2014  and pt last seen 09/26/2014   .

## 2014-09-29 ENCOUNTER — Encounter: Payer: Self-pay | Admitting: Dietician

## 2014-09-29 ENCOUNTER — Encounter: Payer: Self-pay | Admitting: Endocrinology

## 2014-09-29 ENCOUNTER — Ambulatory Visit (INDEPENDENT_AMBULATORY_CARE_PROVIDER_SITE_OTHER): Payer: Commercial Managed Care - HMO | Admitting: Endocrinology

## 2014-09-29 ENCOUNTER — Other Ambulatory Visit: Payer: Self-pay | Admitting: *Deleted

## 2014-09-29 ENCOUNTER — Ambulatory Visit: Payer: Commercial Managed Care - HMO | Admitting: Dietician

## 2014-09-29 VITALS — BP 130/78 | HR 90 | Temp 97.7°F | Resp 16 | Ht 69.0 in | Wt 290.0 lb

## 2014-09-29 DIAGNOSIS — E118 Type 2 diabetes mellitus with unspecified complications: Secondary | ICD-10-CM | POA: Diagnosis not present

## 2014-09-29 DIAGNOSIS — E1165 Type 2 diabetes mellitus with hyperglycemia: Secondary | ICD-10-CM

## 2014-09-29 DIAGNOSIS — IMO0002 Reserved for concepts with insufficient information to code with codable children: Secondary | ICD-10-CM

## 2014-09-29 DIAGNOSIS — E7849 Other hyperlipidemia: Secondary | ICD-10-CM

## 2014-09-29 DIAGNOSIS — Z89511 Acquired absence of right leg below knee: Secondary | ICD-10-CM | POA: Diagnosis not present

## 2014-09-29 DIAGNOSIS — L97811 Non-pressure chronic ulcer of other part of right lower leg limited to breakdown of skin: Secondary | ICD-10-CM | POA: Diagnosis not present

## 2014-09-29 DIAGNOSIS — T8781 Dehiscence of amputation stump: Secondary | ICD-10-CM | POA: Diagnosis not present

## 2014-09-29 DIAGNOSIS — E785 Hyperlipidemia, unspecified: Secondary | ICD-10-CM

## 2014-09-29 DIAGNOSIS — R809 Proteinuria, unspecified: Secondary | ICD-10-CM | POA: Diagnosis not present

## 2014-09-29 LAB — POCT GLUCOSE (DEVICE FOR HOME USE): Glucose Fasting, POC: 568 mg/dL — AB (ref 70–99)

## 2014-09-29 MED ORDER — CANAGLIFLOZIN-METFORMIN HCL 50-1000 MG PO TABS: ORAL_TABLET | ORAL | Status: DC

## 2014-09-29 NOTE — Patient Instructions (Addendum)
Check blood sugars on waking up ..3-4.[name] times a week Also check blood sugars about 2 hours after a meal and do this after different meals by rotation  Recommended blood sugar levels on waking up is 90-130 and about 2 hours after meal is 140-180 Please bring blood sugar monitor to each visit.  LANTUS insulin: Start taking 45 units twice a day HUMALOG insulin: Take 10 units for small meals and 15 units for your main meal before each meal regardless of the blood sugar Start taking 500 mg metformin, 2 tablets with breakfast and supper  When the prescription for Invokamet comes in start taking this instead of metformin at breakfast and supper  STOP drinking any drinks with sugar Avoid all high-fat meats, fried food and high fat dairy products and gravy   Restart taking Zetia

## 2014-09-29 NOTE — Patient Instructions (Signed)
Avoid drinking drinks with sugar (coolade, regular soda, sweet).  These will all make your blood sugar high. Choose more vegetables. Watch portion size of meat. Bake rather than fry and remove the skin from the chicken. [employer] toast instead of biscuits.

## 2014-09-29 NOTE — Progress Notes (Signed)
Patient ID: Leroy [name], male   DOB: [birth year], 69 y.o.   MRN: [id number]           Reason for Appointment: Consultation for Type 2 Diabetes  Referring physician: [name]  History of Present Illness:          Date of diagnosis of type 2 diabetes mellitus: 1983      Background history:  He had been initially treated with metformin and Amaryl but had not been able to get good control partly from noncompliance with medications and follow-up He thinks he was started on insulin in 1999 but no records of this are available and he may have been taking NPH insulin Since 2011 his A1c has been ranging from 11-14 and only in 10/15 it was slightly better at 8.3 when he was better compliant with his regimen  Recent history:   He is now being referred for persistently poor control He is supposed to be taking Lantus twice a day and Humalog with every meal but he does not do so He is also supposed to be taking metformin and Amaryl but does not take these much  INSULIN regimen is described as: Lantus 50 units, mostly in the morning, Humalog 5 units in the morning  Current blood sugar patterns and problems identified:  He is checking his blood sugars mostly in the morning and these are usually high, frequently over 200  He gets thirsty and drinks various kinds of regular drinks without regard to sugar content, frequently drinking Kool-Aid during the night, today had regular ginger ale and milk  He does not keep a record of his blood sugar and did not bring his monitor  Not clear how often he takes his Humalog, did not take it this morning at breakfast.  Usually taking less than 10 units in the morning   He is usually taking the Lantus only once a day and does not remember to take it in the evenings  Oral hypoglycemic drugs the patient is taking are: Metformin, Amaryl as above     Side effects from medications have been: None  Compliance with the medical regimen: Poor Hypoglycemia:    never  Glucose monitoring:  done 1   times a day         GlucomAccu-Chek    Blood Glucose readings by recall  PREMEAL Breakfast Lunch Dinner Bedtime  Overall   Glucose range: 194-260   ?   Median:        Self-care: The diet that the patient has been following is none Typical meal intake: Breakfast is variable, sometimes eating biscuits.  Typically consuming drinks with sugar and not restricting fat                Dietician visit, most recent: 2001                Exercise:   unable to do any   Weight history: Wt Readings from Last 3 Encounters:  09/29/14 290 lb (131.543 kg)  09/02/14 290 lb (131.543 kg)  06/30/14 292 lb (132.45 kg)    Glycemic control:   Lab Results  Component Value Date   HGBA1C 11.5* 09/26/2014   HGBA1C 8.3* 01/28/2014   HGBA1C 12.4* 03/25/2013   Lab Results  Component Value Date   MICROALBUR 77.6* 09/26/2014   LDLCALC 155* 01/28/2014   CREATININE 1.33 09/26/2014         Medication List       This list is accurate as  of: 09/29/14  3:45 PM.  Always use your most recent med list.               ACCU-CHEK FASTCLIX LANCET Kit  Test up to TID dx: 250.62     ACCU-CHEK NANO SMARTVIEW W/DEVICE Kit  Test up to TID dx: 250.62     ACCU-CHEK SMARTVIEW CONTROL Liqd  Test up to TID dx: 250.62     B-D SINGLE USE SWABS REGULAR Pads     Canagliflozin-Metformin HCl 50-1000 MG Tabs  Commonly known as:  INVOKAMET  Take 1 tablet twice a day     ezetimibe 10 MG tablet  Commonly known as:  ZETIA  Take 1 tablet (10 mg total) by mouth daily.     insulin aspart 100 UNIT/ML injection  Commonly known as:  NOVOLOG  Inject 5 Units into the skin 3 (three) times daily as needed for high blood sugar.     insulin glargine 100 UNIT/ML injection  Commonly known as:  LANTUS  Inject 0.45 mLs (45 Units total) into the skin 2 (two) times daily.     Insulin Syringe-Needle U-100 31G X 5/16" 0.3 ML Misc  Commonly known as:  B-D INS SYR HALF-UNIT .3CC/31G  Use 5  times a day with insulin vial     lisinopril 5 MG tablet  Commonly known as:  PRINIVIL,ZESTRIL  Take 1 tablet (5 mg total) by mouth daily.     metFORMIN 500 MG tablet  Commonly known as:  GLUCOPHAGE  Take 1 tablet (500 mg total) by mouth 2 (two) times daily with a meal.     metoprolol succinate 50 MG 24 hr tablet  Commonly known as:  TOPROL-XL  Take 1 tablet (50 mg total) by mouth 2 (two) times daily. Take with or immediately following a meal.     nitroGLYCERIN 0.4 MG SL tablet  Commonly known as:  NITROSTAT  Place 1 tablet (0.4 mg total) under the tongue every 5 (five) minutes as needed for chest pain.     PRODIGY NO CODING BLOOD GLUC test strip  Generic drug:  glucose blood     glucose blood test strip  Commonly known as:  ACCU-CHEK SMARTVIEW  Test up to TID dx: 250.62     rivaroxaban 20 MG Tabs tablet  Commonly known as:  XARELTO  Take 1 tablet (20 mg total) by mouth daily at 6 PM.        Allergies:  Allergies  Allergen Reactions  . Statins     Muscle aches  . Amlodipine Besylate     Muscle aches making difficult to walk  . Cephalexin     REACTION: Hives    Past Medical History  Diagnosis Date  . Hyperlipidemia   . Ischemic cardiomyopathy     a. EF 40% 2010. b. Echo (2/14):  mild LVH, EF 30-35%, diff HK, Gr 1 DD, MAC, mild MR, mod LAE, PASP 39  . Cataract   . Hypotension   . Paroxysmal atrial fibrillation   . Peripheral vascular disease     a. s/p L BKA 2014.  [name] History of blood clots     in legs--this was in 2011--has been off of Coumadin 70month;takes Xarelto daily  . Joint pain   . H/O hiatal hernia   . Chronic kidney disease     on Lisinopril to protect kidneys d/t being on Metformin per pt  . Myocardial infarction     total of 8 heart attacks;last one in 2011  .  Atrial flutter   . H/O medication noncompliance     Due to insurance issues  . Chronic systolic CHF (congestive heart failure)   . Coronary artery disease     a. s/p remote CABG 2001  at [facility] b. Cath 03/2010: for med rx.;  c.  Carlton Adam myoview (1/14):  Large Inferior apical MI, very small area of peri-infarct ischemia toward the inferior apical segment. EF 19%.  Med Rx was continued.    . Diabetes mellitus 1979    takes Metformni daily  and Lantus 50units bid  . DVT (deep venous thrombosis) 2011    legs  . Peripheral neuropathy   . Shortness of breath dyspnea     Past Surgical History  Procedure Laterality Date  . Revasculariztion  2011/2010    x 2   . Tonsillectomy    . Cardiac catheterization  03/19/10  . Abdominal aortagram  04-30-12  . Adenoidectomy    . Amputation Left 06/03/2012    Procedure: AMPUTATION BELOW KNEE;  Surgeon: Conrad [place], MD;  Location: [place];  Service: Vascular;  Laterality: Left;  . Coronary angioplasty  2002    3 stents  . Endarterectomy femoral Right 03/25/2013    Procedure: ENDARTERECTOMY FEMORAL ;  Surgeon: Conrad [place], MD;  Location: [facility];  Service: Vascular;  Laterality: Right;  . Patch angioplasty Right 03/25/2013    Procedure: PATCH ANGIOPLASTY USING VASCU-GUARD PERIPHERAL VASCULAR PATCH;  Surgeon: Conrad [place], MD;  Location: [place];  Service: Vascular;  Laterality: Right;  . Amputation Right 04/14/2013    Procedure: AMPUTATION BELOW KNEE ;  Surgeon: Conrad [place], MD;  Location: [place];  Service: Vascular;  Laterality: Right;  . I&d extremity Right 04/14/2013    Procedure: IRRIGATION AND DEBRIDEMENT OF RIGHT  GROIN & PLACEMENT OF VAC DRESSING;  Surgeon: Conrad [place], MD;  Location: [place];  Service: Vascular;  Laterality: Right;  . Abdominal aortagram N/A 04/30/2012    Procedure: ABDOMINAL Maxcine Ham;  Surgeon: Conrad [place], MD;  Location: [facility] C[facility] LAB;  Service: Cardiovascular;  Laterality: N/A;  . Lower extremity angiogram Bilateral 04/30/2012    Procedure: LOWER EXTREMITY ANGIOGRAM;  Surgeon: Conrad [place], MD;  Location: [facility] CATH LAB;  Service: Cardiovascular;  Laterality: Bilateral;  bilat lower extrem angio  . Eye surgery Right      Catarct  . Coronary artery bypass graft  03/2000    quadruple  . Hernia repair      Hiatal Hernia  . Wound debridement Right 04/20/2014    s/p  rt bka  with application of wound vac  . I&d extremity Right 04/20/2014    Procedure: DEBRIDEMENT OF RIGHT BELOW KNEE AMPUTATION STUMP;  Surgeon: Conrad [place], MD;  Location: [facility] OR;  Service: Vascular;  Laterality: Right;  . Application of wound vac Right 04/20/2014    Procedure: APPLICATION OF WOUND VAC;  Surgeon: Conrad [place], MD;  Location: [place];  Service: Vascular;  Laterality: Right;    Family History  Problem Relation Age of Onset  . Heart disease Mother     before age 50  . Diabetes Mother   . Heart attack Mother   . Heart disease Father     Social History:  reports that he has quit smoking. His smoking use included Cigarettes. He has never used smokeless tobacco. He reports that he does not drink alcohol or use illicit drugs.    Review of Systems    Lipid history:  Lab Results  Component Value Date   CHOL 292* 09/26/2014   HDL 42.70 09/26/2014   LDLCALC 155* 01/28/2014   LDLDIRECT 209.0 09/26/2014   TRIG 209.0* 09/26/2014   CHOLHDL 7 09/26/2014           Constitutional: no recent weight gain/loss, no complaints of unusual fatigue   Eyes: no history of blurred vision.  Most recent eye exam was   ENT: no nasal congestion, difficulty swallowing, no hoarseness   Cardiovascular: no chest pain or tightness on exertion.  No leg swelling.  Hypertension:  Respiratory: no cough/shortness of breath  Gastrointestinal: no constipation, diarrhea, nausea or abdominal pain  Musculoskeletal: no muscle/joint aches   Urological:   No frequency of urination or  nocturia  Skin: no rash or infections  Neurological: no headaches.  Has no numbness, burning, pains or tingling in feet    Psychiatric: no symptoms of depression  Endocrine: No unusual fatigue, cold intolerance or history of thyroid disease   LABS:  Orders  Only on 09/29/2014  Component Date Value Ref Range Status  . Glucose Fasting, POC 09/29/2014 568* 70 - 99 mg/dL Final  Appointment on 09/26/2014  Component Date Value Ref Range Status  . Hgb A1c MFr Bld 09/26/2014 11.5* 4.6 - 6.5 % Final   Glycemic Control Guidelines for People with Diabetes:Non Diabetic:  <6%Goal of Therapy: <7%Additional Action Suggested:  >8%   . Sodium 09/26/2014 133* 135 - 145 mEq/L Final  . Potassium 09/26/2014 3.9  3.5 - 5.1 mEq/L Final  . Chloride 09/26/2014 101  96 - 112 mEq/L Final  . CO2 09/26/2014 26  19 - 32 mEq/L Final  . Glucose, Bld 09/26/2014 332* 70 - 99 mg/dL Final  . BUN 09/26/2014 11  6 - 23 mg/dL Final  . Creatinine, Ser 09/26/2014 1.33  0.40 - 1.50 mg/dL Final  . Calcium 09/26/2014 8.7  8.4 - 10.5 mg/dL Final  . GFR 09/26/2014 68.83  >60.00 mL/min Final  . Microalb, Ur 09/26/2014 77.6* 0.0 - 1.9 mg/dL Final  . Creatinine,U 09/26/2014 161.0   Final  . Microalb Creat Ratio 09/26/2014 48.2* 0.0 - 30.0 mg/g Final  . WBC 09/26/2014 7.4  4.0 - 10.5 K/uL Final  . RBC 09/26/2014 5.44  4.22 - 5.81 Mil/uL Final  . Hemoglobin 09/26/2014 16.7  13.0 - 17.0 g/dL Final  . HCT 09/26/2014 48.7  39.0 - 52.0 % Final  . MCV 09/26/2014 89.6  78.0 - 100.0 fl Final  . MCHC 09/26/2014 34.3  30.0 - 36.0 g/dL Final  . RDW 09/26/2014 13.8  11.5 - 15.5 % Final  . Platelets 09/26/2014 190.0  150.0 - 400.0 K/uL Final  . Neutrophils Relative % 09/26/2014 58.8  43.0 - 77.0 % Final  . Lymphocytes Relative 09/26/2014 31.4  12.0 - 46.0 % Final  . Monocytes Relative 09/26/2014 7.8  3.0 - 12.0 % Final  . Eosinophils Relative 09/26/2014 1.5  0.0 - 5.0 % Final  . Basophils Relative 09/26/2014 0.5  0.0 - 3.0 % Final  . Neutro Abs 09/26/2014 4.3  1.4 - 7.7 K/uL Final  . Lymphs Abs 09/26/2014 2.3  0.7 - 4.0 K/uL Final  . Monocytes Absolute 09/26/2014 0.6  0.1 - 1.0 K/uL Final  . Eosinophils Absolute 09/26/2014 0.1  0.0 - 0.7 K/uL Final  . Basophils Absolute 09/26/2014 0.0  0.0 - 0.1  K/uL Final  . Cholesterol 09/26/2014 292* 0 - 200 mg/dL Final   ATP III Classification       Desirable:  <  200 mg/dL               Borderline High:  200 - 239 mg/dL          High:  > = 240 mg/dL  . Triglycerides 09/26/2014 209.0* 0.0 - 149.0 mg/dL Final   Normal:  <150 mg/dLBorderline High:  150 - 199 mg/dL  . HDL 09/26/2014 42.70  >39.00 mg/dL Final  . VLDL 09/26/2014 41.8* 0.0 - 40.0 mg/dL Final  . Total CHOL/HDL Ratio 09/26/2014 7   Final                  Men          Women1/2 Average Risk     3.4          3.3Average Risk          5.0          4.42X Average Risk          9.6          7.13X Average Risk          15.0          11.0                      . NonHDL 09/26/2014 249.30   Final   NOTE:  Non-HDL goal should be 30 mg/dL higher than patient's LDL goal (i.e. LDL goal of < 70 mg/dL, would have non-HDL goal of < 100 mg/dL)  . Direct LDL 09/26/2014 209.0   Final   Optimal:  <100 mg/dLNear or Above Optimal:  100-129 mg/dLBorderline High:  130-159 mg/dLHigh:  160-189 mg/dLVery High:  >190 mg/dL    Physical Examination:  BP 130/78 mmHg  Pulse 90  Temp(Src) 97.7 F (36.5 C) (Oral)  Resp 16  Ht _0  (1.753 m)  Wt 290 lb (131.543 kg)  BMI 42.81 kg/m2  SpO2 96%  GENERAL:         Patient has generalized obesity.   HEENT:         Eye exam shows normal external appearance. Fundus exam shows no retinopathy. Oral exam shows normal mucosa .  NECK:   There is no lymphadenopathy Thyroid is not enlarged and no nodules felt.  Carotids are normal to palpation and no bruit heard LUNGS:         Chest is symmetrical. Lungs are clear to auscultation.[name]   HEART:         Heart sounds:  S1 and S2 are normal. No murmurs or clicks heard., no S3 or S4.   ABDOMEN:   There is no distention present. Liver and spleen are not palpable. No other mass or tenderness present.   NEUROLOGICAL:  normal sensation to touch in the fingertips Foot exam not indicated, has amputation           MUSCULOSKELETAL:  There is no  swelling or deformity of the peripheral joints. Spine is normal to inspection.   EXTREMITIES:  bilateral below-knee amputation present    There is no peripheral edema. No skin lesions present. SKIN:       No rash or lesions of concern.        ASSESSMENT:  Diabetes type 2, uncontrolled with severe obesity Because of his poor compliance and significant obesity he is not achieving control and has had consistently high A1c levels over 11% for at least the last 5 years Despite his multiple complications he is not motivated to take care of  his diabetes and be compliant with his day-to-day regimen with diet, insulin and medications as well as glucose monitoring as directed He also does need to be treated more aggressively with mealtime insulin which he is taking very small doses of Difficult to assess his blood sugar patterns and insulin need as he is noncompliant with insulin and glucose monitoring is required Also has poor understanding and compliance with diet and meal planning Unable to be very active because of his amputation  He does need additional therapy to help with weight loss in addition to increasing insulin  Has not been tried on a medication like Invokana or Victoza Discussed action of SGLT 2 drugs on lowering glucose by decreasing kidney absorption of glucose, benefits of weight loss and lower blood pressure, possible side effects including candidiasis and dosage regimen   Complications: Peripheral vascular disease, bilateral amputations, microalbuminuria, macrovascular disease including history of coronary artery disease and peripheral vascular disease  HYPERCHOLESTEROLEMIA: This is very pronounced and currently untreated.  Reportedly intolerant to all statin drugs and still not on any medication  PLAN:   Emphasized the need for improving his diet with reduction in simple sugars, high fat meals and carbohydrates  He will see the dietitian today for consultation for meal  planning  He will start taking Lantus twice a day consistently, to start with 45 units  He will start taking Humalog consistently with every meal regardless of blood sugar.  For now will use 10 units before smaller meals and 15 units for  larger meals  He will start monitoring his blood sugars at various times of the day as discussed and bring his monitor for download on each visit  Increase metformin to 2 g twice a day when he has his prescription available for mail-order he will switch to Invokamet 50/1000 twice a day  Start Zetia that he has at home and consider a trial of Liavalo 2 Mg  May also consider aggressive treatment with Repatha if not able to afford or tolerate statin Start monitoring blood sugars at various times of the day especially after meals and bring monitor for download on each visit Discussed blood sugar targets   Patient Instructions  Check blood sugars on waking up ..3-4.[name] times a week Also check blood sugars about 2 hours after a meal and do this after different meals by rotation  Recommended blood sugar levels on waking up is 90-130 and about 2 hours after meal is 140-180 Please bring blood sugar monitor to each visit.  LANTUS insulin: Start taking 45 units twice a day HUMALOG insulin: Take 10 units for small meals and 15 units for your main meal before each meal regardless of the blood sugar Start taking 500 mg metformin, 2 tablets with breakfast and supper  When the prescription for Invokamet comes in start taking this instead of metformin at breakfast and supper  STOP drinking any drinks with sugar Avoid all high-fat meats, fried food and high fat dairy products and gravy   Restart taking Zetia   Counseling time on subjects discussed above is over 50% of today's 60 minute visit   Magdelyn Roebuck 09/29/2014, 3:45 PM   Note: This office note was prepared with Dragon voice recognition system technology. Any transcriptional errors that result from this  process are unintentional.

## 2014-09-29 NOTE — Progress Notes (Signed)
  Medical Nutrition Therapy:  Appt start time: 1500 end time:  1530.   Assessment:  Primary concerns today: Patient is a walk in of Leroy [name] with uncontrolled diabetes.  Hx includes type 2 diabetes for about 40 years, hyperlipidemia, and bilateral leg amputations. CBG;s 194-260 after breakfast and 568 today at visit four hours after his last meal.  Weight is stable at 290 lbs.  HgbA1C of 11.5 09/26/14.  Patient is here alone.  He lives alone and uses a wheel chair and has only 1 prosthetic.  He drives around town picking up Copy.  He does not always take his medication as he is busy and does not have it with him.  He does not drink diet drinks and uses large amounts of Koolaid/regular soda.  Up to 1/2 gallon water and 1/2 gallon nightly per patient report.  Preferred Learning Style:   No preference indicated   Learning Readiness:   Not ready  MEDICATIONS: see list   DIETARY INTAKE: Cooks at home mostly. 24-hr recall:  B ( 10 AM): fried eggs and cheese, grits, sausage biscuit most mornings.  Occasional toast but would rather have a biscuit.   Coffee with 1 Tablespoon sugar. Snk ( AM):   L ( PM):  Snk ( PM):  D ( PM): pot of pintos only or baked or fried chicken or baked pork chop or stew beef with rice or potatoes and occasional vegetables or sandwich or hot dogs Snk ( PM):  Beverages: regular soda and Kool-Aid, sweet tea, water.  Drinks 1/2 gallon water and 1/2 gallon Kool-Aid at night  Usual physical activity: Transferring in and out of wheelchair and basic ADL's.    Estimated energy needs: 1600 calories 180 g carbohydrates 100 g protein 53 g fat  Progress Towards Goal(s):  In progress.   Nutritional Diagnosis:  NB-1.3 Not ready for diet/lifestyle change  As related to carbohydrate modified diet.  As evidenced by patient report.    Intervention:  Nutrition counseling and diabetes education initiated. Reviewed most important aspects of diet change.  Patient was polite  and listened but willingness to change is in question.  Patient somewhat resistant to changing habits.  Avoid drinking drinks with sugar (coolade, regular soda, sweet).  These will all make your blood sugar high. Choose more vegetables. Watch portion size of meat. Bake rather than fry and remove the skin from the chicken. [employer] toast instead of biscuit  Teaching Method Utilized:  Scientific laboratory technician  Handouts given during visit include:  My plate placemat  Snack list  Barriers to learning/adherence to lifestyle change: none  Demonstrated degree of understanding via:  Teach Back   Monitoring/Evaluation:  Dietary intake, and body weight prn.

## 2014-10-04 DIAGNOSIS — Z89511 Acquired absence of right leg below knee: Secondary | ICD-10-CM | POA: Diagnosis not present

## 2014-10-04 DIAGNOSIS — L97811 Non-pressure chronic ulcer of other part of right lower leg limited to breakdown of skin: Secondary | ICD-10-CM | POA: Diagnosis not present

## 2014-10-04 DIAGNOSIS — T8781 Dehiscence of amputation stump: Secondary | ICD-10-CM | POA: Diagnosis not present

## 2014-10-06 ENCOUNTER — Telehealth: Payer: Self-pay

## 2014-10-06 NOTE — Telephone Encounter (Signed)
Received fax from [facility] stating that a script for BD pen needles 31G x 5/16 (5 x daily) was received, however, patient states that he uses TID with insulin.

## 2014-10-07 MED ORDER — "INSULIN SYRINGE-NEEDLE U-100 31G X 5/16"" 0.3 ML MISC": Status: DC

## 2014-10-11 ENCOUNTER — Encounter (HOSPITAL_BASED_OUTPATIENT_CLINIC_OR_DEPARTMENT_OTHER): Payer: Commercial Managed Care - HMO | Attending: General Surgery

## 2014-10-11 DIAGNOSIS — E1151 Type 2 diabetes mellitus with diabetic peripheral angiopathy without gangrene: Secondary | ICD-10-CM | POA: Insufficient documentation

## 2014-10-11 DIAGNOSIS — T8131XD Disruption of external operation (surgical) wound, not elsewhere classified, subsequent encounter: Secondary | ICD-10-CM | POA: Diagnosis not present

## 2014-10-11 DIAGNOSIS — X58XXXD Exposure to other specified factors, subsequent encounter: Secondary | ICD-10-CM | POA: Diagnosis not present

## 2014-10-12 ENCOUNTER — Encounter: Payer: Commercial Managed Care - HMO | Admitting: Nutrition

## 2014-10-21 ENCOUNTER — Ambulatory Visit: Payer: Commercial Managed Care - HMO | Admitting: Endocrinology

## 2014-10-21 ENCOUNTER — Encounter: Payer: Self-pay | Admitting: *Deleted

## 2014-10-21 ENCOUNTER — Encounter: Payer: Commercial Managed Care - HMO | Admitting: Dietician

## 2014-10-21 DIAGNOSIS — Z0289 Encounter for other administrative examinations: Secondary | ICD-10-CM

## 2014-11-04 DIAGNOSIS — Z89512 Acquired absence of left leg below knee: Secondary | ICD-10-CM | POA: Diagnosis not present

## 2014-11-04 DIAGNOSIS — Z89511 Acquired absence of right leg below knee: Secondary | ICD-10-CM | POA: Diagnosis not present

## 2014-11-04 DIAGNOSIS — I999 Unspecified disorder of circulatory system: Secondary | ICD-10-CM | POA: Diagnosis not present

## 2014-11-11 DIAGNOSIS — H25012 Cortical age-related cataract, left eye: Secondary | ICD-10-CM | POA: Diagnosis not present

## 2014-11-11 DIAGNOSIS — E11329 Type 2 diabetes mellitus with mild nonproliferative diabetic retinopathy without macular edema: Secondary | ICD-10-CM | POA: Diagnosis not present

## 2014-11-11 DIAGNOSIS — H2512 Age-related nuclear cataract, left eye: Secondary | ICD-10-CM | POA: Diagnosis not present

## 2014-11-14 ENCOUNTER — Ambulatory Visit: Payer: Commercial Managed Care - HMO | Attending: Vascular Surgery | Admitting: Physical Therapy

## 2014-11-14 DIAGNOSIS — R269 Unspecified abnormalities of gait and mobility: Secondary | ICD-10-CM

## 2014-11-14 DIAGNOSIS — R531 Weakness: Secondary | ICD-10-CM

## 2014-11-14 DIAGNOSIS — Z89512 Acquired absence of left leg below knee: Secondary | ICD-10-CM | POA: Insufficient documentation

## 2014-11-14 DIAGNOSIS — R2681 Unsteadiness on feet: Secondary | ICD-10-CM | POA: Diagnosis not present

## 2014-11-14 DIAGNOSIS — M25659 Stiffness of unspecified hip, not elsewhere classified: Secondary | ICD-10-CM

## 2014-11-14 DIAGNOSIS — R2689 Other abnormalities of gait and mobility: Secondary | ICD-10-CM

## 2014-11-14 DIAGNOSIS — M24659 Ankylosis, unspecified hip: Secondary | ICD-10-CM | POA: Diagnosis not present

## 2014-11-14 DIAGNOSIS — Z5189 Encounter for other specified aftercare: Secondary | ICD-10-CM | POA: Diagnosis not present

## 2014-11-14 DIAGNOSIS — R29818 Other symptoms and signs involving the nervous system: Secondary | ICD-10-CM | POA: Diagnosis not present

## 2014-11-14 DIAGNOSIS — Z89511 Acquired absence of right leg below knee: Secondary | ICD-10-CM | POA: Diagnosis not present

## 2014-11-15 NOTE — Therapy (Signed)
[facility] [address] [place], [place], [address] Phone: [phone]   Fax:  [phone]  Physical Therapy Evaluation  Patient Details  Name: Leroy [name] MRN: [id number] Date of Birth: 05-11-1945 Referring Provider:  [name], MD  Encounter Date: 11/14/2014      PT End of Session - 11/14/14 1100    Visit Number 1   Number of Visits 18   Date for PT Re-Evaluation 01/13/15   Authorization Type G-code every 10th visit   PT Start Time 1103   PT Stop Time 1149   PT Time Calculation (min) 46 min   Equipment Utilized During Treatment Gait belt      Past Medical History  Diagnosis Date  . Hyperlipidemia   . Ischemic cardiomyopathy     a. EF 40% 2010. b. Echo (2/14):  mild LVH, EF 30-35%, diff HK, Gr 1 DD, MAC, mild MR, mod LAE, PASP 39  . Cataract   . Hypotension   . Paroxysmal atrial fibrillation   . Peripheral vascular disease     a. s/p L BKA 2014.  [name] History of blood clots     in legs--this was in 2011--has been off of Coumadin 50months;takes Xarelto daily  . Joint pain   . H/O hiatal hernia   . Chronic kidney disease     on Lisinopril to protect kidneys d/t being on Metformin per pt  . Myocardial infarction     total of 8 heart attacks;last one in 2011  . Atrial flutter   . H/O medication noncompliance     Due to insurance issues  . Chronic systolic CHF (congestive heart failure)   . Coronary artery disease     a. s/p remote CABG 2001 at [facility] b. Cath 03/2010: for med rx.;  c.  [name] myoview (1/14):  Large Inferior apical MI, very small area of peri-infarct ischemia toward the inferior apical segment. EF 19%.  Med Rx was continued.    . Diabetes mellitus 1979    takes Metformni daily  and Lantus 50units bid  . DVT (deep venous thrombosis) 2011    legs  . Peripheral neuropathy   . Shortness of breath dyspnea     Past Surgical History  Procedure Laterality Date  . Revasculariztion   2011/2010    x 2   . Tonsillectomy    . Cardiac catheterization  03/19/10  . Abdominal aortagram  04-30-12  . Adenoidectomy    . Amputation Left 06/03/2012    Procedure: AMPUTATION BELOW KNEE;  Surgeon: [name], MD;  Location: [facility] OR;  Service: Vascular;  Laterality: Left;  . Coronary angioplasty  2002    3 stents  . Endarterectomy femoral Right 03/25/2013    Procedure: ENDARTERECTOMY FEMORAL ;  Surgeon: [name], MD;  Location: [facility] OR;  Service: Vascular;  Laterality: Right;  . Patch angioplasty Right 03/25/2013    Procedure: PATCH ANGIOPLASTY USING VASCU-GUARD PERIPHERAL VASCULAR PATCH;  Surgeon: [name], MD;  Location: [facility] OR;  Service: Vascular;  Laterality: Right;  . Amputation Right 04/14/2013    Procedure: AMPUTATION BELOW KNEE ;  Surgeon: [name], MD;  Location: [facility] OR;  Service: Vascular;  Laterality: Right;  . I&d extremity Right 04/14/2013    Procedure: IRRIGATION AND DEBRIDEMENT OF RIGHT  GROIN & PLACEMENT OF VAC DRESSING;  Surgeon: [name], MD;  Location: [facility] OR;  Service: Vascular;  Laterality: Right;  . Abdominal aortagram N/A 04/30/2012  Procedure: ABDOMINAL AORTAGRAM;  Surgeon: [name], MD;  Location: [facility] CATH LAB;  Service: Cardiovascular;  Laterality: N/A;  . Lower extremity angiogram Bilateral 04/30/2012    Procedure: LOWER EXTREMITY ANGIOGRAM;  Surgeon: [name], MD;  Location: [facility] CATH LAB;  Service: Cardiovascular;  Laterality: Bilateral;  bilat lower extrem angio  . Eye surgery Right     Catarct  . Coronary artery bypass graft  03/2000    quadruple  . Hernia repair      Hiatal Hernia  . Wound debridement Right 04/20/2014    s/p  rt bka  with application of wound vac  . I&d extremity Right 04/20/2014    Procedure: DEBRIDEMENT OF RIGHT BELOW KNEE AMPUTATION STUMP;  Surgeon: [name], MD;  Location: [facility] OR;  Service: Vascular;  Laterality: Right;  . Application of wound vac Right 04/20/2014    Procedure: APPLICATION OF WOUND VAC;  Surgeon:  [name], MD;  Location: [facility] OR;  Service: Vascular;  Laterality: Right;    There were no vitals filed for this visit.  Visit Diagnosis:  Abnormality of gait  Unsteadiness  Balance problems  Weakness generalized  Decreased range of hip movement, unspecified laterality  Status post bilateral below knee amputation      Subjective Assessment - 11/14/14 1106    Subjective This 69yo male underwent a left Transtibial Amputation 06/03/2012 and was functioning in community without device with 1 prosthesis. His right LE became infected and required a right Transtibial Amputation 04/14/2013 and would not heal for ~1 year. He underwent debridement surgically of right residual imb 04/20/2014. It finally healed ~1 month ago. He recieved first right prosthesis on 11/04/2014 and socket revision on left prosthesis. He presents to PT for evaluation for prosthetic training with bilateral Transtibial prostheses.    Patient Stated Goals To use prostheses in home & community.    Currently in Pain? No/denies            [facility] PT Assessment - 11/14/14 1100    Assessment   Medical Diagnosis Bilateral Transtibial Amputation   Onset Date/Surgical Date 11/04/14   Precautions   Precautions Fall   Restrictions   Weight Bearing Restrictions No   Balance Screen   Has the patient fallen in the past 6 months No   Has the patient had a decrease in activity level because of a fear of falling?  No   Is the patient reluctant to leave their home because of a fear of falling?  No   Home Environment   Living Environment Private residence   Living Arrangements Alone   Type of Home House   Home Access Ramped entrance  2nd entrance 4 steps with 2 rails can reach   Home Layout One level   Home Equipment Walker - 2 wheels;Cane - single point;Bedside commode;Shower seat;Grab bars - tub/shower;Wheelchair - Engineer, technical sales - power   Prior Function   Level of Independence Independent;Independent with household  mobility without device;Independent with community mobility without device;Independent with gait   Posture/Postural Control   Posture/Postural Control Postural limitations   Postural Limitations Rounded Shoulders;Forward head;Flexed trunk;Increased lumbar lordosis  wide base   ROM / Strength   AROM / PROM / Strength AROM;Strength   AROM   Overall AROM  Deficits   AROM Assessment Site Hip;Knee   Right/Left Hip Right;Left   Right Hip Extension -25  standing   Left Hip Extension -25  standing   Right/Left Knee Right;Left   Right Knee Extension -9  Left Knee Extension -7   Strength   Overall Strength Deficits   Strength Assessment Site Hip;Knee   Right/Left Hip Right;Left   Right Hip Flexion 4/5   Right Hip Extension 3-/5   Right Hip ABduction 3-/5   Left Hip Flexion 4-/5   Left Hip Extension 3-/5   Left Hip ABduction 3-/5   Right/Left Knee Right;Left   Right Knee Flexion 3+/5   Right Knee Extension 4-/5   Left Knee Flexion 4-/5   Left Knee Extension 4/5   Transfers   Transfers Sit to Stand;Stand to Sit;Squat Pivot Transfers   Sit to Stand 5: Supervision;From elevated surface;With upper extremity assist;With armrests;From chair/3-in-1;Multiple attempts   Stand to Sit 5: Supervision;With upper extremity assist;With armrests;To chair/3-in-1   Squat Pivot Transfers 5: Supervision;With upper extremity assistance;With armrests   Ambulation/Gait   Ambulation/Gait Yes   Ambulation/Gait Assistance 4: Min assist   Ambulation Distance (Feet) 75 Feet   Assistive device Rollator;Prostheses   Gait Pattern Step-through pattern;Decreased step length - left;Decreased stance time - right;Decreased stride length;Decreased hip/knee flexion - right;Decreased weight shift to right;Right flexed knee in stance;Trunk flexed;Wide base of support;Poor foot clearance - right  excessive UE weight bearing on walker   Ambulation Surface Indoor;Level   Gait velocity 0.40 ft/sec   Standardized Balance  Assessment   Standardized Balance Assessment Berg Balance Test;Timed Up and Go Test   Berg Balance Test   Sit to Stand Needs minimal aid to stand or to stabilize  minA or touch RW to stabilize   Standing Unsupported Needs several tries to stand 30 seconds unsupported  stands with RW 2 minutes safely   Sitting with Back Unsupported but Feet Supported on Floor or Stool Able to sit safely and securely 2 minutes   Stand to Sit Uses backs of legs against chair to control descent   Transfers Able to transfer safely, definite need of hands   Standing Unsupported with Eyes Closed Needs help to keep from falling  with RW maintains balance 10sec with supervision   Standing Ubsupported with Feet Together Needs help to attain position and unable to hold for 15 seconds  with RW able to stand 60sec with supervision   From Standing, Reach Forward with Outstretched Arm Reaches forward but needs supervision  with RW reaches 5"   From Standing Position, Pick up Object from Floor Unable to try/needs assist to keep balance  with RW able to pick up with SBA   From Standing Position, Turn to Look Behind Over each Shoulder Needs assist to keep from losing balance and falling  with looks to side safely   Turn 360 Degrees Needs assistance while turning  with RW close supervision   Standing Unsupported, Alternately Place Feet on Step/Stool Needs assistance to keep from falling or unable to try   Standing Unsupported, One Foot in [employer] balance while stepping or standing  with RW takes small step & holds 30 sec   Standing on One Leg Unable to try or needs assist to prevent fall  with RW stands 10sec on each LE   Total Score 12   Timed Up and Go Test   Normal TUG (seconds) 46.61  rollator walker         Prosthetics Assessment - 11/14/14 1100    Prosthetics   Prosthetic Care Dependent with Skin check;Residual limb care;Prosthetic cleaning;Ply sock cleaning;Correct ply sock adjustment;Proper wear  schedule/adjustment   Prosthetic Care Comments  PT instructed liners to be labeled Right & left  Donning prosthesis  Supervision   Doffing prosthesis  Modified independent (Device/Increase time)   Current prosthetic wear tolerance (days/week)  daily wear since delivery 10 days ago   Current prosthetic wear tolerance (#hours/day)  wears bilatearal prostheses "all day" "8-10hours" l   Current prosthetic weight-bearing tolerance (hours/day)  8 min with partial WB onRW   Residual limb condition  newer right limb white over moist, heavy sweat smell, no open areas, left LE WNL                  [facility] Adult PT Treatment/Exercise - 11/14/14 1100    Prosthetics   Education Provided Residual limb care;Prosthetic cleaning;Proper Donning;Proper wear schedule/adjustment  right prosthesis wear 4hrs w/drying q 2hours 2x/day,    Person(s) Educated Patient   Education Method Explanation   Education Method Verbalized understanding;Needs further instruction                  PT Short Term Goals - 11/14/14 1100    PT SHORT TERM GOAL #1   Title Patient tolerates wear of bilateral prostheses >10 hours total per day without skin issues or tenderness. (Target Date: 12/14/2014)   Time 1   Period Months   Status New   PT SHORT TERM GOAL #2   Title patient verbalizes signs of sweating in prostheses and proper drying to prevent skin issues. (Target Date: 12/14/2014)   Time 1   Period Months   Status New   PT SHORT TERM GOAL #3   Title Patient ambulates 250' with rollator walker & prostheses with supervision. (Target Date: 12/14/2014)   Time 1   Period Months   Status New   PT SHORT TERM GOAL #4   Title Patient negotiates ramps & curbs with rollator walker & prostheses with supervision. (Target Date: 12/14/2014)   Time 1   Period Months   Status New           PT Long Term Goals - 11/14/14 1100    PT LONG TERM GOAL #1   Title Patient tolerates wear of bilateral TTA prostheses >90% of  awake hours without skin issues or tenderness to enable function throughout his day. (Target Date: 01/13/2015)   Time 2   Period Months   Status New   PT LONG TERM GOAL #2   Title patient verbalizes proper prosthetic care including adjusting ply socks to enable safe prosthetic use.  (Target Date: 01/13/2015)   Time 2   Period Months   Status New   PT LONG TERM GOAL #3   Title Berg Balance >30/56  (Target Date: 01/13/2015)   Time 2   Period Months   Status New   PT LONG TERM GOAL #4   Title Patient ambulates >300' with LRAD & prostheses modified independent to enable community access.  (Target Date: 01/13/2015)   Time 2   Period Months   Status New   PT LONG TERM GOAL #5   Title Patient negotiates ramps, curbs & stairs (1 rail) with LRAD & prostheses modified independent for community access.  (Target Date: 01/13/2015)   Time 2   Period Months   Status New               Plan - 11/14/14 1100    Clinical Impression Statement This 69yo male had left Transtibial Amputation 06/03/2012 and was using a prosthesis to function at community level with some limitations due to right osteoarthritis with needed knee replacement. He underwent a Transtibial Amputation of right LE  on 04/14/2013 and unable to heal due to infections; he had debridement surgery of right residual limb on 04/20/2014 and limb healed 5 months later. He has been w/c bound for >18months due to old left TTA and new right TTA & healing process which has caused deconditioning. He is dependent in safe use of prostheses. He recieved his first right prosthesis 10 days ago and is already wearing 8-10hours which is too rapid and can lead to skin integrity problems. He is dependent in transfers, standing balance with Berg Balance score of 12/56 and prosthetic gait with bilateral Transtibial prostheses (gait velocity of 0.40 ft/sec with RW & Timed Up & Go 46.61sec).                                              Pt will benefit from skilled  therapeutic intervention in order to improve on the following deficits Abnormal gait;Decreased activity tolerance;Decreased balance;Decreased knowledge of precautions;Decreased knowledge of use of DME;Decreased strength;Postural dysfunction;Prosthetic Dependency   Rehab Potential Good   PT Frequency 2x / week   PT Duration Other (comment)  9 weeks (60 days)   PT Treatment/Interventions ADLs/Self Care Home Management;DME Instruction;Gait training;Stair training;Functional mobility training;Therapeutic activities;Therapeutic exercise;Balance training;Neuromuscular re-education;Patient/family education;Prosthetic Training   PT Next Visit Plan review prosthetic care, gait with rollator on ramps & curbs, instruct in stairs with 2 rails with 2 prostheses   Consulted and Agree with Plan of Care Patient          G-Codes - 2014/11/26 1100    Functional Assessment Tool Used progressed wear of new right prosthesis too rapidly placing at risk of skin breakdwon as overmoist, dependent in care,    Functional Limitation Self care   Self Care Current Status (O0370) At least 40 percent but less than 60 percent impaired, limited or restricted   Self Care Goal Status (W8889) At least 1 percent but less than 20 percent impaired, limited or restricted       Problem List Patient Active Problem List   Diagnosis Date Noted  . Microalbuminuria 09/29/2014  . 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 Phantom limb pain 08/16/2013  . Pain in limb- Right stump 07/06/2013  . Atherosclerosis of native arteries of the extremities with ulceration 06/25/2013  . S/P bilateral BKA (below knee amputation) 04/21/2013  . Chronic systolic heart failure 04/15/2013  . 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 S/P BKA (below knee amputation) bilateral 09/10/2012  . SVT (supraventricular tachycardia) 09/02/2012  . Hyperlipidemia with target LDL less than 70 01/02/2012  . Type II diabetes mellitus with  manifestations, uncontrolled 05/03/2010  . Coronary atherosclerosis 04/13/2010  . ATRIAL FIBRILLATION, PAROXYSMAL 04/13/2010    Vladimir Faster PT, DPT 11/15/2014, 7:18 AM   [facility] [address] [place], [place], [address] Phone: [phone]   Fax:  [phone]

## 2014-11-17 ENCOUNTER — Encounter: Payer: Self-pay | Admitting: Physical Therapy

## 2014-11-17 ENCOUNTER — Ambulatory Visit: Payer: Commercial Managed Care - HMO | Admitting: Physical Therapy

## 2014-11-17 DIAGNOSIS — Z89512 Acquired absence of left leg below knee: Secondary | ICD-10-CM | POA: Diagnosis not present

## 2014-11-17 DIAGNOSIS — Z89511 Acquired absence of right leg below knee: Secondary | ICD-10-CM | POA: Diagnosis not present

## 2014-11-17 DIAGNOSIS — R269 Unspecified abnormalities of gait and mobility: Secondary | ICD-10-CM

## 2014-11-17 DIAGNOSIS — M24659 Ankylosis, unspecified hip: Secondary | ICD-10-CM | POA: Diagnosis not present

## 2014-11-17 DIAGNOSIS — R29818 Other symptoms and signs involving the nervous system: Secondary | ICD-10-CM | POA: Diagnosis not present

## 2014-11-17 DIAGNOSIS — R531 Weakness: Secondary | ICD-10-CM | POA: Diagnosis not present

## 2014-11-17 DIAGNOSIS — R2689 Other abnormalities of gait and mobility: Secondary | ICD-10-CM

## 2014-11-17 DIAGNOSIS — R2681 Unsteadiness on feet: Secondary | ICD-10-CM

## 2014-11-17 DIAGNOSIS — Z5189 Encounter for other specified aftercare: Secondary | ICD-10-CM | POA: Diagnosis not present

## 2014-11-18 NOTE — Therapy (Signed)
[facility] [address] [place], [place], [address] Phone: [phone]   Fax:  [phone]  Physical Therapy Treatment  Patient Details  Name: Leroy [name] MRN: [id number] Date of Birth: [birth year] Referring Provider:  Etta Grandchild, MD  Encounter Date: 11/17/2014      PT End of Session - 11/17/14 0930    Visit Number 2   Number of Visits 18   Date for PT Re-Evaluation 01/13/15   Authorization Type G-code every 10th visit   PT Start Time 0935   PT Stop Time 1015   PT Time Calculation (min) 40 min   Equipment Utilized During Treatment Gait belt   Activity Tolerance Patient tolerated treatment well   Behavior During Therapy [facility] for tasks assessed/performed      Past Medical History  Diagnosis Date  . Hyperlipidemia   . Ischemic cardiomyopathy     a. EF 40% 2010. b. Echo (2/14):  mild LVH, EF 30-35%, diff HK, Gr 1 DD, MAC, mild MR, mod LAE, PASP 39  . Cataract   . Hypotension   . Paroxysmal atrial fibrillation   . Peripheral vascular disease     a. s/p L BKA 2014.  [name] History of blood clots     in legs--this was in 2011--has been off of Coumadin 69months;takes Xarelto daily  . Joint pain   . H/O hiatal hernia   . Chronic kidney disease     on Lisinopril to protect kidneys d/t being on Metformin per pt  . Myocardial infarction     total of 8 heart attacks;last one in 2011  . Atrial flutter   . H/O medication noncompliance     Due to insurance issues  . Chronic systolic CHF (congestive heart failure)   . Coronary artery disease     a. s/p remote CABG 2001 at [facility] b. Cath 03/2010: for med rx.;  c.  [name] myoview (1/14):  Large Inferior apical MI, very small area of peri-infarct ischemia toward the inferior apical segment. EF 19%.  Med Rx was continued.    . Diabetes mellitus 1979    takes Metformni daily  and Lantus 50units bid  . DVT (deep venous thrombosis) 2011    legs  . Peripheral neuropathy    . Shortness of breath dyspnea     Past Surgical History  Procedure Laterality Date  . Revasculariztion  2011/2010    x 2   . Tonsillectomy    . Cardiac catheterization  03/19/10  . Abdominal aortagram  04-30-12  . Adenoidectomy    . Amputation Left 06/03/2012    Procedure: AMPUTATION BELOW KNEE;  Surgeon: [name], MD;  Location: [facility] OR;  Service: Vascular;  Laterality: Left;  . Coronary angioplasty  2002    3 stents  . Endarterectomy femoral Right 03/25/2013    Procedure: ENDARTERECTOMY FEMORAL ;  Surgeon: [name], MD;  Location: [facility] OR;  Service: Vascular;  Laterality: Right;  . Patch angioplasty Right 03/25/2013    Procedure: PATCH ANGIOPLASTY USING VASCU-GUARD PERIPHERAL VASCULAR PATCH;  Surgeon: [name], MD;  Location: [facility] OR;  Service: Vascular;  Laterality: Right;  . Amputation Right 04/14/2013    Procedure: AMPUTATION BELOW KNEE ;  Surgeon: [name], MD;  Location: [facility] OR;  Service: Vascular;  Laterality: Right;  . I&d extremity Right 04/14/2013    Procedure: IRRIGATION AND DEBRIDEMENT OF RIGHT  GROIN & PLACEMENT OF VAC DRESSING;  Surgeon: [name], MD;  Location: [facility] OR;  Service: Vascular;  Laterality: Right;  . Abdominal aortagram N/A 04/30/2012    Procedure: ABDOMINAL AORTAGRAM;  Surgeon: [name], MD;  Location: [facility] CATH LAB;  Service: Cardiovascular;  Laterality: N/A;  . Lower extremity angiogram Bilateral 04/30/2012    Procedure: LOWER EXTREMITY ANGIOGRAM;  Surgeon: [name], MD;  Location: [facility] CATH LAB;  Service: Cardiovascular;  Laterality: Bilateral;  bilat lower extrem angio  . Eye surgery Right     Catarct  . Coronary artery bypass graft  03/2000    quadruple  . Hernia repair      Hiatal Hernia  . Wound debridement Right 04/20/2014    s/p  rt bka  with application of wound vac  . I&d extremity Right 04/20/2014    Procedure: DEBRIDEMENT OF RIGHT BELOW KNEE AMPUTATION STUMP;  Surgeon: [name], MD;  Location: [facility] OR;  Service: Vascular;   Laterality: Right;  . Application of wound vac Right 04/20/2014    Procedure: APPLICATION OF WOUND VAC;  Surgeon: [name], MD;  Location: [facility] OR;  Service: Vascular;  Laterality: Right;    There were no vitals filed for this visit.  Visit Diagnosis:  Abnormality of gait  Unsteadiness  Balance problems  Weakness generalized  Status post bilateral below knee amputation      Subjective Assessment - 11/17/14 0941    Subjective He changed soap and leg is not sweating as bad. He wore prosthesis 5 hrs but only once per day.    Currently in Pain? No/denies                         [facility] Adult PT Treatment/Exercise - 11/17/14 0930    Transfers   Transfers Sit to Stand;Stand to Sit;Squat Pivot Transfers   Sit to Stand 5: Supervision;From elevated surface;With upper extremity assist;With armrests;From chair/3-in-1;Multiple attempts   Sit to Stand Details Verbal cues for technique;Verbal cues for precautions/safety   Stand to Sit 5: Supervision;With upper extremity assist;With armrests;To chair/3-in-1   Stand to Sit Details (indicate cue type and reason) Verbal cues for precautions/safety;Verbal cues for technique   Squat Pivot Transfers 5: Supervision;With upper extremity assistance;With armrests   Ambulation/Gait   Ambulation/Gait Yes   Ambulation/Gait Assistance 4: Min assist   Ambulation/Gait Assistance Details cues on posture, step width, and rollator safety   Ambulation Distance (Feet) 125 Feet  125' with RW, 125' X 2 with rollator walker   Assistive device Rollator;Prostheses;Rolling walker   Gait Pattern Step-through pattern;Decreased step length - left;Decreased stance time - right;Decreased stride length;Decreased hip/knee flexion - right;Decreased weight shift to right;Right flexed knee in stance;Trunk flexed;Wide base of support;Poor foot clearance - right  excessive UE weight bearing on walker   Ambulation Surface Indoor;Level   Gait velocity --   Stairs  Yes   Stairs Assistance 4: Min assist;5: Supervision   Stairs Assistance Details (indicate cue type and reason) PT demo prior & verbal cues during on technique with 2 prostheses   Stair Management Technique Two rails;Alternating pattern;Step to pattern;Forwards  bilateral prostheses, ascend alternate, descend step-to   Number of Stairs 4  3 reps   Ramp 4: Min assist  RW & prosthesis   Ramp Details (indicate cue type and reason) demo prior & verbal cues during for technique   Curb 4: Min assist  RW & prosthesis   Curb Details (indicate cue type and reason) demo prior & verbal cues during for technique  Posture/Postural Control   Posture/Postural Control --   Postural Limitations --   Prosthetics   Prosthetic Care Comments  PT instructed liners to be labeled Right & left   Current prosthetic wear tolerance (days/week)  --   Current prosthetic wear tolerance (#hours/day)  wore right prosthesis 8hours   Current prosthetic weight-bearing tolerance (hours/day)  8 min with partial WB onRW   Residual limb condition  newer right limb white over moist, heavy sweat but less than eval, no open areas, left LE WNL   Education Provided Residual limb care;Prosthetic cleaning;Proper Donning;Proper wear schedule/adjustment  right prosthesis wear 4hrs w/drying q 2hours 2x/day,    Person(s) Educated Patient   Education Method Explanation;Demonstration;Tactile cues;Verbal cues   Education Method Verbalized understanding;Tactile cues required;Verbal cues required;Needs further instruction                  PT Short Term Goals - 11/14/14 1100    PT SHORT TERM GOAL #1   Title Patient tolerates wear of bilateral prostheses >10 hours total per day without skin issues or tenderness. (Target Date: 12/14/2014)   Time 1   Period Months   Status New   PT SHORT TERM GOAL #2   Title patient verbalizes signs of sweating in prostheses and proper drying to prevent skin issues. (Target Date: 12/14/2014)    Time 1   Period Months   Status New   PT SHORT TERM GOAL #3   Title Patient ambulates 250' with rollator walker & prostheses with supervision. (Target Date: 12/14/2014)   Time 1   Period Months   Status New   PT SHORT TERM GOAL #4   Title Patient negotiates ramps & curbs with rollator walker & prostheses with supervision. (Target Date: 12/14/2014)   Time 1   Period Months   Status New           PT Long Term Goals - 11/14/14 1100    PT LONG TERM GOAL #1   Title Patient tolerates wear of bilateral TTA prostheses >90% of awake hours without skin issues or tenderness to enable function throughout his day. (Target Date: 01/13/2015)   Time 2   Period Months   Status New   PT LONG TERM GOAL #2   Title patient verbalizes proper prosthetic care including adjusting ply socks to enable safe prosthetic use.  (Target Date: 01/13/2015)   Time 2   Period Months   Status New   PT LONG TERM GOAL #3   Title Berg Balance >30/56  (Target Date: 01/13/2015)   Time 2   Period Months   Status New   PT LONG TERM GOAL #4   Title Patient ambulates >300' with LRAD & prostheses modified independent to enable community access.  (Target Date: 01/13/2015)   Time 2   Period Months   Status New   PT LONG TERM GOAL #5   Title Patient negotiates ramps, curbs & stairs (1 rail) with LRAD & prostheses modified independent for community access.  (Target Date: 01/13/2015)   Time 2   Period Months   Status New               Plan - 11/17/14 0930    Clinical Impression Statement Patient had significant varus moment of left knee with stance. PT spoke with prosthetist who is working on new socket to be delivered next week. PT recommended higher walls due to history of knee osteoarthritis to give increased support.   Pt will benefit from skilled therapeutic intervention  in order to improve on the following deficits Abnormal gait;Decreased activity tolerance;Decreased balance;Decreased knowledge of  precautions;Decreased knowledge of use of DME;Decreased strength;Postural dysfunction;Prosthetic Dependency   Rehab Potential Good   PT Frequency 2x / week   PT Duration Other (comment)  9 weeks (60 days)   PT Treatment/Interventions ADLs/Self Care Home Management;DME Instruction;Gait training;Stair training;Functional mobility training;Therapeutic activities;Therapeutic exercise;Balance training;Neuromuscular re-education;Patient/family education;Prosthetic Training   PT Next Visit Plan review prosthetic care, gait with rollator on ramps & curbs, stairs with 2 rails with 2 prostheses   Consulted and Agree with Plan of Care Patient        Problem List Patient Active Problem List   Diagnosis Date Noted  . Microalbuminuria 09/29/2014  . 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 Phantom limb pain 08/16/2013  . Pain in limb- Right stump 07/06/2013  . Atherosclerosis of native arteries of the extremities with ulceration 06/25/2013  . S/P bilateral BKA (below knee amputation) 04/21/2013  . Chronic systolic heart failure 04/15/2013  . 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 S/P BKA (below knee amputation) bilateral 09/10/2012  . SVT (supraventricular tachycardia) 09/02/2012  . Hyperlipidemia with target LDL less than 70 01/02/2012  . Type II diabetes mellitus with manifestations, uncontrolled 05/03/2010  . Coronary atherosclerosis 04/13/2010  . ATRIAL FIBRILLATION, PAROXYSMAL 04/13/2010    Vladimir Faster PT, DPT 11/18/2014, 2:58 AM  [facility] [address] [place], [place], [address] Phone: [phone]   Fax:  [phone]

## 2014-11-22 ENCOUNTER — Encounter: Payer: Self-pay | Admitting: Physical Therapy

## 2014-11-22 ENCOUNTER — Ambulatory Visit: Payer: Commercial Managed Care - HMO | Admitting: Physical Therapy

## 2014-11-22 DIAGNOSIS — Z5189 Encounter for other specified aftercare: Secondary | ICD-10-CM | POA: Diagnosis not present

## 2014-11-22 DIAGNOSIS — R29818 Other symptoms and signs involving the nervous system: Secondary | ICD-10-CM | POA: Diagnosis not present

## 2014-11-22 DIAGNOSIS — Z89512 Acquired absence of left leg below knee: Secondary | ICD-10-CM | POA: Diagnosis not present

## 2014-11-22 DIAGNOSIS — R269 Unspecified abnormalities of gait and mobility: Secondary | ICD-10-CM

## 2014-11-22 DIAGNOSIS — Z89511 Acquired absence of right leg below knee: Secondary | ICD-10-CM | POA: Diagnosis not present

## 2014-11-22 DIAGNOSIS — M24659 Ankylosis, unspecified hip: Secondary | ICD-10-CM | POA: Diagnosis not present

## 2014-11-22 DIAGNOSIS — R531 Weakness: Secondary | ICD-10-CM

## 2014-11-22 DIAGNOSIS — R2681 Unsteadiness on feet: Secondary | ICD-10-CM

## 2014-11-23 NOTE — Therapy (Signed)
[facility] [address] [place], [place], [address] Phone: [phone]   Fax:  [phone]  Physical Therapy Treatment  Patient Details  Name: Leroy [name] MRN: [id number] Date of Birth: 1945-05-16 Referring Provider:  Etta Grandchild, MD  Encounter Date: 11/22/2014      PT End of Session - 11/22/14 1320    Visit Number 3   Number of Visits 18   Date for PT Re-Evaluation 01/13/15   Authorization Type G-code every 10th visit   PT Start Time 1318   PT Stop Time 1400   PT Time Calculation (min) 42 min   Equipment Utilized During Treatment Gait belt   Activity Tolerance Patient tolerated treatment well   Behavior During Therapy [facility] for tasks assessed/performed      Past Medical History  Diagnosis Date  . Hyperlipidemia   . Ischemic cardiomyopathy     a. EF 40% 2010. b. Echo (2/14):  mild LVH, EF 30-35%, diff HK, Gr 1 DD, MAC, mild MR, mod LAE, PASP 39  . Cataract   . Hypotension   . Paroxysmal atrial fibrillation   . Peripheral vascular disease     a. s/p L BKA 2014.  [name] History of blood clots     in legs--this was in 2011--has been off of Coumadin 74months;takes Xarelto daily  . Joint pain   . H/O hiatal hernia   . Chronic kidney disease     on Lisinopril to protect kidneys d/t being on Metformin per pt  . Myocardial infarction     total of 8 heart attacks;last one in 2011  . Atrial flutter   . H/O medication noncompliance     Due to insurance issues  . Chronic systolic CHF (congestive heart failure)   . Coronary artery disease     a. s/p remote CABG 2001 at [facility] b. Cath 03/2010: for med rx.;  c.  [name] myoview (1/14):  Large Inferior apical MI, very small area of peri-infarct ischemia toward the inferior apical segment. EF 19%.  Med Rx was continued.    . Diabetes mellitus 1979    takes Metformni daily  and Lantus 50units bid  . DVT (deep venous thrombosis) 2011    legs  . Peripheral neuropathy    . Shortness of breath dyspnea     Past Surgical History  Procedure Laterality Date  . Revasculariztion  2011/2010    x 2   . Tonsillectomy    . Cardiac catheterization  03/19/10  . Abdominal aortagram  04-30-12  . Adenoidectomy    . Amputation Left 06/03/2012    Procedure: AMPUTATION BELOW KNEE;  Surgeon: [name], MD;  Location: [facility] OR;  Service: Vascular;  Laterality: Left;  . Coronary angioplasty  2002    3 stents  . Endarterectomy femoral Right 03/25/2013    Procedure: ENDARTERECTOMY FEMORAL ;  Surgeon: [name], MD;  Location: [facility] OR;  Service: Vascular;  Laterality: Right;  . Patch angioplasty Right 03/25/2013    Procedure: PATCH ANGIOPLASTY USING VASCU-GUARD PERIPHERAL VASCULAR PATCH;  Surgeon: [name], MD;  Location: [facility] OR;  Service: Vascular;  Laterality: Right;  . Amputation Right 04/14/2013    Procedure: AMPUTATION BELOW KNEE ;  Surgeon: [name], MD;  Location: [facility] OR;  Service: Vascular;  Laterality: Right;  . I&d extremity Right 04/14/2013    Procedure: IRRIGATION AND DEBRIDEMENT OF RIGHT  GROIN & PLACEMENT OF VAC DRESSING;  Surgeon: [name], MD;  Location: [facility] OR;  Service: Vascular;  Laterality: Right;  . Abdominal aortagram N/A 04/30/2012    Procedure: ABDOMINAL AORTAGRAM;  Surgeon: [name], MD;  Location: [facility] CATH LAB;  Service: Cardiovascular;  Laterality: N/A;  . Lower extremity angiogram Bilateral 04/30/2012    Procedure: LOWER EXTREMITY ANGIOGRAM;  Surgeon: [name], MD;  Location: [facility] CATH LAB;  Service: Cardiovascular;  Laterality: Bilateral;  bilat lower extrem angio  . Eye surgery Right     Catarct  . Coronary artery bypass graft  03/2000    quadruple  . Hernia repair      Hiatal Hernia  . Wound debridement Right 04/20/2014    s/p  rt bka  with application of wound vac  . I&d extremity Right 04/20/2014    Procedure: DEBRIDEMENT OF RIGHT BELOW KNEE AMPUTATION STUMP;  Surgeon: [name], MD;  Location: [facility] OR;  Service: Vascular;   Laterality: Right;  . Application of wound vac Right 04/20/2014    Procedure: APPLICATION OF WOUND VAC;  Surgeon: [name], MD;  Location: [facility] OR;  Service: Vascular;  Laterality: Right;    There were no vitals filed for this visit.  Visit Diagnosis:  Abnormality of gait  Unsteadiness  Weakness generalized      Subjective Assessment - 11/22/14 1319    Subjective No new complaints. No falls or pain to report. Recieved new sockets this am from [place] [facility].    Currently in Pain? No/denies          [facility] Adult PT Treatment/Exercise - 11/22/14 1321    Transfers   Sit to Stand 5: Supervision;From elevated surface;With upper extremity assist;With armrests;From chair/3-in-1;Multiple attempts   Sit to Stand Details Verbal cues for sequencing;Verbal cues for technique   Stand to Sit 5: Supervision;With upper extremity assist;With armrests;To chair/3-in-1   Stand to Sit Details (indicate cue type and reason) Verbal cues for sequencing;Verbal cues for technique;Verbal cues for precautions/safety   Ambulation/Gait   Ambulation/Gait Yes   Ambulation/Gait Assistance 4: Min assist   Ambulation/Gait Assistance Details cues on posture, step length and rollator use.   Ambulation Distance (Feet) 125 Feet  x2 reps   Assistive device Rollator;Prostheses   Gait Pattern Step-through pattern;Decreased step length - left;Decreased stance time - right;Decreased stride length;Decreased hip/knee flexion - right;Decreased weight shift to right;Right flexed knee in stance;Trunk flexed;Wide base of support;Poor foot clearance - right   Ambulation Surface Level;Indoor   High Level Balance   High Level Balance Activities Marching forwards;Marching backwards;Side stepping   High Level Balance Comments in parallel bars x3 laps each/each way with min guard assist and cues on posture and technique.   Prosthetics   Prosthetic Care Comments  Instructed pt on importance of consistent daily wear times as his  wear times fluctuate daily. also edcuated on importance of drying limbs and reviewed signs of sweating.   Current prosthetic wear tolerance (days/week)  daily   Current prosthetic wear tolerance (#hours/day)  wearing both 5-6 hours a day   Residual limb condition  both intact   Education Provided Proper wear schedule/adjustment;Residual limb care   Person(s) Educated Patient   Education Method Explanation;Verbal cues   Education Method Verbalized understanding   Donning Prosthesis Supervision   Doffing Prosthesis Supervision           PT Short Term Goals - 11/14/14 1100    PT SHORT TERM GOAL #1   Title Patient tolerates wear of bilateral prostheses >10 hours total per day without  skin issues or tenderness. (Target Date: 12/14/2014)   Time 1   Period Months   Status New   PT SHORT TERM GOAL #2   Title patient verbalizes signs of sweating in prostheses and proper drying to prevent skin issues. (Target Date: 12/14/2014)   Time 1   Period Months   Status New   PT SHORT TERM GOAL #3   Title Patient ambulates 250' with rollator walker & prostheses with supervision. (Target Date: 12/14/2014)   Time 1   Period Months   Status New   PT SHORT TERM GOAL #4   Title Patient negotiates ramps & curbs with rollator walker & prostheses with supervision. (Target Date: 12/14/2014)   Time 1   Period Months   Status New           PT Long Term Goals - 11/14/14 1100    PT LONG TERM GOAL #1   Title Patient tolerates wear of bilateral TTA prostheses >90% of awake hours without skin issues or tenderness to enable function throughout his day. (Target Date: 01/13/2015)   Time 2   Period Months   Status New   PT LONG TERM GOAL #2   Title patient verbalizes proper prosthetic care including adjusting ply socks to enable safe prosthetic use.  (Target Date: 01/13/2015)   Time 2   Period Months   Status New   PT LONG TERM GOAL #3   Title Berg Balance >30/56  (Target Date: 01/13/2015)   Time 2   Period  Months   Status New   PT LONG TERM GOAL #4   Title Patient ambulates >300' with LRAD & prostheses modified independent to enable community access.  (Target Date: 01/13/2015)   Time 2   Period Months   Status New   PT LONG TERM GOAL #5   Title Patient negotiates ramps, curbs & stairs (1 rail) with LRAD & prostheses modified independent for community access.  (Target Date: 01/13/2015)   Time 2   Period Months   Status New           Plan - 11/22/14 1321    Clinical Impression Statement Pt has new sockets today, however the lateral walls are not higher that his other ones as recommended by PT at last visit. Pt did not demo as much knee instability today. Making steady progress toward goals.   Pt will benefit from skilled therapeutic intervention in order to improve on the following deficits Abnormal gait;Decreased activity tolerance;Decreased balance;Decreased knowledge of precautions;Decreased knowledge of use of DME;Decreased strength;Postural dysfunction;Prosthetic Dependency   Rehab Potential Good   PT Frequency 2x / week   PT Duration Other (comment)  9 weeks (60 days)   PT Treatment/Interventions ADLs/Self Care Home Management;DME Instruction;Gait training;Stair training;Functional mobility training;Therapeutic activities;Therapeutic exercise;Balance training;Neuromuscular re-education;Patient/family education;Prosthetic Training   PT Next Visit Plan HEP: for lower extremity strengthening, education on appropriate gym equipement to use as pt want to return to gym, cont toward goals   Consulted and Agree with Plan of Care Patient        Problem List Patient Active Problem List   Diagnosis Date Noted  . Microalbuminuria 09/29/2014  . 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 Phantom limb pain 08/16/2013  . Pain in limb- Right stump 07/06/2013  . Atherosclerosis of native arteries of the extremities with ulceration 06/25/2013  . S/P bilateral BKA (below  knee amputation) 04/21/2013  . Chronic systolic heart failure 04/15/2013  . 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 S/P  BKA (below knee amputation) bilateral 09/10/2012  . SVT (supraventricular tachycardia) 09/02/2012  . Hyperlipidemia with target LDL less than 70 01/02/2012  . Type II diabetes mellitus with manifestations, uncontrolled 05/03/2010  . Coronary atherosclerosis 04/13/2010  . ATRIAL FIBRILLATION, PAROXYSMAL 04/13/2010    Sallyanne Kuster 11/23/2014, 7:52 PM  Sallyanne Kuster, PTA, [facility] [address] [place], [place] [address] [phone] 11/23/2014, 7:52 PM

## 2014-11-24 ENCOUNTER — Encounter: Payer: Self-pay | Admitting: Physical Therapy

## 2014-11-24 ENCOUNTER — Ambulatory Visit: Payer: Commercial Managed Care - HMO | Admitting: Physical Therapy

## 2014-11-24 DIAGNOSIS — R269 Unspecified abnormalities of gait and mobility: Secondary | ICD-10-CM

## 2014-11-24 DIAGNOSIS — R531 Weakness: Secondary | ICD-10-CM | POA: Diagnosis not present

## 2014-11-24 DIAGNOSIS — Z89512 Acquired absence of left leg below knee: Secondary | ICD-10-CM | POA: Diagnosis not present

## 2014-11-24 DIAGNOSIS — Z5189 Encounter for other specified aftercare: Secondary | ICD-10-CM | POA: Diagnosis not present

## 2014-11-24 DIAGNOSIS — M24659 Ankylosis, unspecified hip: Secondary | ICD-10-CM | POA: Diagnosis not present

## 2014-11-24 DIAGNOSIS — Z89511 Acquired absence of right leg below knee: Secondary | ICD-10-CM

## 2014-11-24 DIAGNOSIS — R2681 Unsteadiness on feet: Secondary | ICD-10-CM | POA: Diagnosis not present

## 2014-11-24 DIAGNOSIS — R29818 Other symptoms and signs involving the nervous system: Secondary | ICD-10-CM | POA: Diagnosis not present

## 2014-11-24 DIAGNOSIS — R2689 Other abnormalities of gait and mobility: Secondary | ICD-10-CM

## 2014-11-24 DIAGNOSIS — M25659 Stiffness of unspecified hip, not elsewhere classified: Secondary | ICD-10-CM

## 2014-11-24 NOTE — Patient Instructions (Signed)
HIP: Abduction - Standing   Hold onto sturdy object:Squeeze glutes. Raise leg out and slightly back. Alternate legs. _10__ reps per leg.1-2 times a day.  Copyright  VHI. All rights reserved.  HIP / KNEE: Extension - Standing   Hold sturdy surface (facing surface.Squeeze glutes. Raise and lift leg backward. Keep knee straight or slightly bent.Alteranate legs.  _10__ reps each leg,1-2 times a day.   Copyright  VHI. All rights reserved.  Mini-Squats (Standing)   Hold onto sturdy surface:Bend knees slightly. Hold for 5 seconds. Return to straight standing.  Repeat __10_ times. Do _1-2__ times a day.  Copyright  VHI. All rights reserved.  Strengthening: Hip Abductor - Resisted   Seated in wheelchair with band looped around both legs above knees, push thighs apart,keep feet together. Hold for 5 seconds. Repeat _10__ times per set.  Do _1-2_ sessions per day.  http://orth.exer.us/688    Copyright  VHI. All rights reserved.  Knee Extension: Resisted (Sitting)   With band looped around right prothesis and under other foot, straighten right leg with ankle loop. Keep other leg bent to increase resistance. Repeat with other leg. Repeat __10__ times each leg. Do __1-2__ sessions per day.  http://orth.exer.us/690   Copyright  VHI. All rights reserved.

## 2014-11-25 NOTE — Therapy (Signed)
[facility] [address] [place], [place], [address] Phone: [phone]   Fax:  [phone]  Physical Therapy Treatment  Patient Details  Name: Leroy [name] MRN: [id number] Date of Birth: 1945-04-19 Referring Provider:  Etta Grandchild, MD  Encounter Date: 11/24/2014      PT End of Session - 11/24/14 1325    Visit Number 4   Number of Visits 18   Date for PT Re-Evaluation 01/13/15   Authorization Type G-code every 10th visit   PT Start Time 1317   PT Stop Time 1400   PT Time Calculation (min) 43 min   Equipment Utilized During Treatment Gait belt   Activity Tolerance Patient tolerated treatment well   Behavior During Therapy [facility] for tasks assessed/performed      Past Medical History  Diagnosis Date  . Hyperlipidemia   . Ischemic cardiomyopathy     a. EF 40% 2010. b. Echo (2/14):  mild LVH, EF 30-35%, diff HK, Gr 1 DD, MAC, mild MR, mod LAE, PASP 39  . Cataract   . Hypotension   . Paroxysmal atrial fibrillation   . Peripheral vascular disease     a. s/p L BKA 2014.  [name] History of blood clots     in legs--this was in 2011--has been off of Coumadin 40months;takes Xarelto daily  . Joint pain   . H/O hiatal hernia   . Chronic kidney disease     on Lisinopril to protect kidneys d/t being on Metformin per pt  . Myocardial infarction     total of 8 heart attacks;last one in 2011  . Atrial flutter   . H/O medication noncompliance     Due to insurance issues  . Chronic systolic CHF (congestive heart failure)   . Coronary artery disease     a. s/p remote CABG 2001 at [facility] b. Cath 03/2010: for med rx.;  c.  [name] myoview (1/14):  Large Inferior apical MI, very small area of peri-infarct ischemia toward the inferior apical segment. EF 19%.  Med Rx was continued.    . Diabetes mellitus 1979    takes Metformni daily  and Lantus 50units bid  . DVT (deep venous thrombosis) 2011    legs  . Peripheral neuropathy    . Shortness of breath dyspnea     Past Surgical History  Procedure Laterality Date  . Revasculariztion  2011/2010    x 2   . Tonsillectomy    . Cardiac catheterization  03/19/10  . Abdominal aortagram  04-30-12  . Adenoidectomy    . Amputation Left 06/03/2012    Procedure: AMPUTATION BELOW KNEE;  Surgeon: [name], MD;  Location: [facility] OR;  Service: Vascular;  Laterality: Left;  . Coronary angioplasty  2002    3 stents  . Endarterectomy femoral Right 03/25/2013    Procedure: ENDARTERECTOMY FEMORAL ;  Surgeon: [name], MD;  Location[facility] OR;  Service: Vascular;  Laterality: Right;  . Patch angioplasty Right 03/25/2013    Procedure: PATCH ANGIOPLASTY USING VASCU-GUARD PERIPHERAL VASCULAR PATCH;  Surgeon: [name], MD;  Location: [facility] OR;  Service: Vascular;  Laterality: Right;  . Amputation Right 04/14/2013    Procedure: AMPUTATION BELOW KNEE ;  Surgeon: [name], MD;  Location: [facility] OR;  Service: Vascular;  Laterality: Right;  . I&d extremity Right 04/14/2013    Procedure: IRRIGATION AND DEBRIDEMENT OF RIGHT  GROIN & PLACEMENT OF VAC DRESSING;  Surgeon: [name], MD;  Location: [facility] OR;  Service: Vascular;  Laterality: Right;  . Abdominal aortagram N/A 04/30/2012    Procedure: ABDOMINAL AORTAGRAM;  Surgeon: [name], MD;  Location: [facility] CATH LAB;  Service: Cardiovascular;  Laterality: N/A;  . Lower extremity angiogram Bilateral 04/30/2012    Procedure: LOWER EXTREMITY ANGIOGRAM;  Surgeon: [name], MD;  Location: [facility] CATH LAB;  Service: Cardiovascular;  Laterality: Bilateral;  bilat lower extrem angio  . Eye surgery Right     Catarct  . Coronary artery bypass graft  03/2000    quadruple  . Hernia repair      Hiatal Hernia  . Wound debridement Right 04/20/2014    s/p  rt bka  with application of wound vac  . I&d extremity Right 04/20/2014    Procedure: DEBRIDEMENT OF RIGHT BELOW KNEE AMPUTATION STUMP;  Surgeon: [name], MD;  Location[facility] OR;  Service: Vascular;   Laterality: Right;  . Application of wound vac Right 04/20/2014    Procedure: APPLICATION OF WOUND VAC;  Surgeon: [name], MD;  Location: [facility] OR;  Service: Vascular;  Laterality: Right;    There were no vitals filed for this visit.  Visit Diagnosis:  Abnormality of gait  Unsteadiness  Balance problems  Weakness generalized  Status post bilateral below knee amputation  Decreased range of hip movement, unspecified laterality      Subjective Assessment - 11/24/14 1324    Subjective No new complaints. No falls or pain to report.    Currently in Pain? No/denies          [facility] Adult PT Treatment/Exercise - 11/24/14 1326    Ambulation/Gait   Ambulation/Gait Yes   Ambulation/Gait Assistance 4: Min assist   Ambulation/Gait Assistance Details cues on posture, walker position and to correct gait deviations   Ambulation Distance (Feet) 40 Feet   Assistive device Rollator;Prostheses   Gait Pattern Step-through pattern;Decreased step length - left;Decreased stance time - right;Decreased stride length;Decreased hip/knee flexion - right;Decreased weight shift to right;Right flexed knee in stance;Trunk flexed;Wide base of support;Poor foot clearance - right   Ambulation Surface Level;Indoor   Prosthetics   Prosthetic Care Comments  trying out a new antipersperant (clear gel).    Current prosthetic wear tolerance (days/week)  daily   Current prosthetic wear tolerance (#hours/day)  wearing both 5-6 hours a day   Residual limb condition  skin intact, moderate amount of sweat present   Education Provided Residual limb care;Proper wear schedule/adjustment  HEP for lower extremity strengthening   Person(s) Educated Patient   Education Method Explanation;Demonstration;Tactile cues;Handout   Education Method Verbalized understanding;Verbal cues required;Needs further instruction   Donning Prosthesis Supervision   Doffing Prosthesis Supervision            PT Education - 11/25/14 1924     Education provided Yes   Education Details HEP: alternating hip abduction/extension, minisquats, seated hip abduction/ER with green band and seated long arc quads with green band.   Person(s) Educated Patient   Methods Explanation;Demonstration;Handout;Verbal cues   Comprehension Verbalized understanding;Returned demonstration;Verbal cues required          PT Short Term Goals - 11/14/14 1100    PT SHORT TERM GOAL #1   Title Patient tolerates wear of bilateral prostheses >10 hours total per day without skin issues or tenderness. (Target Date: 12/14/2014)   Time 1   Period Months   Status New   PT SHORT TERM GOAL #2   Title patient verbalizes signs of sweating in prostheses  and proper drying to prevent skin issues. (Target Date: 12/14/2014)   Time 1   Period Months   Status New   PT SHORT TERM GOAL #3   Title Patient ambulates 250' with rollator walker & prostheses with supervision. (Target Date: 12/14/2014)   Time 1   Period Months   Status New   PT SHORT TERM GOAL #4   Title Patient negotiates ramps & curbs with rollator walker & prostheses with supervision. (Target Date: 12/14/2014)   Time 1   Period Months   Status New           PT Long Term Goals - 11/14/14 1100    PT LONG TERM GOAL #1   Title Patient tolerates wear of bilateral TTA prostheses >90% of awake hours without skin issues or tenderness to enable function throughout his day. (Target Date: 01/13/2015)   Time 2   Period Months   Status New   PT LONG TERM GOAL #2   Title patient verbalizes proper prosthetic care including adjusting ply socks to enable safe prosthetic use.  (Target Date: 01/13/2015)   Time 2   Period Months   Status New   PT LONG TERM GOAL #3   Title Berg Balance >30/56  (Target Date: 01/13/2015)   Time 2   Period Months   Status New   PT LONG TERM GOAL #4   Title Patient ambulates >300' with LRAD & prostheses modified independent to enable community access.  (Target Date: 01/13/2015)   Time 2    Period Months   Status New   PT LONG TERM GOAL #5   Title Patient negotiates ramps, curbs & stairs (1 rail) with LRAD & prostheses modified independent for community access.  (Target Date: 01/13/2015)   Time 2   Period Months   Status New           Plan - 11/24/14 1325    Clinical Impression Statement Edcuated pt on and issued HEP for leg strengthening. Pt making steady progress toward goals.   Pt will benefit from skilled therapeutic intervention in order to improve on the following deficits Abnormal gait;Decreased activity tolerance;Decreased balance;Decreased knowledge of precautions;Decreased knowledge of use of DME;Decreased strength;Postural dysfunction;Prosthetic Dependency   Rehab Potential Good   PT Frequency 2x / week   PT Duration Other (comment)  9 weeks (60 days)   PT Treatment/Interventions ADLs/Self Care Home Management;DME Instruction;Gait training;Stair training;Functional mobility training;Therapeutic activities;Therapeutic exercise;Balance training;Neuromuscular re-education;Patient/family education;Prosthetic Training   PT Next Visit Plan  education on appropriate gym equipement to use as pt want to return to gym, cont toward goals   Consulted and Agree with Plan of Care Patient        Problem List Patient Active Problem List   Diagnosis Date Noted  . Microalbuminuria 09/29/2014  . 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 Phantom limb pain 08/16/2013  . Pain in limb- Right stump 07/06/2013  . Atherosclerosis of native arteries of the extremities with ulceration 06/25/2013  . S/P bilateral BKA (below knee amputation) 04/21/2013  . Chronic systolic heart failure 04/15/2013  . 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 S/P BKA (below knee amputation) bilateral 09/10/2012  . SVT (supraventricular tachycardia) 09/02/2012  . Hyperlipidemia with target LDL less than 70 01/02/2012  . Type II diabetes mellitus with manifestations, uncontrolled  05/03/2010  . Coronary atherosclerosis 04/13/2010  . ATRIAL FIBRILLATION, PAROXYSMAL 04/13/2010    Sallyanne Kuster 11/25/2014, 7:27 PM  [facility] [address]  [place], [place], [address] Phone: [phone]   Fax:  [phone]

## 2014-11-28 ENCOUNTER — Ambulatory Visit: Payer: Commercial Managed Care - HMO | Admitting: Physical Therapy

## 2014-11-28 ENCOUNTER — Encounter: Payer: Self-pay | Admitting: Physical Therapy

## 2014-11-28 DIAGNOSIS — Z89512 Acquired absence of left leg below knee: Secondary | ICD-10-CM

## 2014-11-28 DIAGNOSIS — R2681 Unsteadiness on feet: Secondary | ICD-10-CM | POA: Diagnosis not present

## 2014-11-28 DIAGNOSIS — M24659 Ankylosis, unspecified hip: Secondary | ICD-10-CM | POA: Diagnosis not present

## 2014-11-28 DIAGNOSIS — Z5189 Encounter for other specified aftercare: Secondary | ICD-10-CM | POA: Diagnosis not present

## 2014-11-28 DIAGNOSIS — Z89511 Acquired absence of right leg below knee: Secondary | ICD-10-CM

## 2014-11-28 DIAGNOSIS — R531 Weakness: Secondary | ICD-10-CM | POA: Diagnosis not present

## 2014-11-28 DIAGNOSIS — R269 Unspecified abnormalities of gait and mobility: Secondary | ICD-10-CM

## 2014-11-28 DIAGNOSIS — R2689 Other abnormalities of gait and mobility: Secondary | ICD-10-CM

## 2014-11-28 DIAGNOSIS — R29818 Other symptoms and signs involving the nervous system: Secondary | ICD-10-CM | POA: Diagnosis not present

## 2014-11-28 NOTE — Therapy (Signed)
[facility] [address] [place], [place], [address] Phone: [phone]   Fax:  [phone]  Physical Therapy Treatment  Patient Details  Name: Leroy [name] MRN: [id number] Date of Birth: [birth year] Referring Provider:  Etta Grandchild, MD  Encounter Date: 11/28/2014      PT End of Session - 11/28/14 0930    Visit Number 5   Number of Visits 18   Date for PT Re-Evaluation 01/13/15   Authorization Type G-code every 10th visit   PT Start Time 0931   PT Stop Time 1015   PT Time Calculation (min) 44 min   Equipment Utilized During Treatment Gait belt   Activity Tolerance Patient tolerated treatment well   Behavior During Therapy [facility] for tasks assessed/performed      Past Medical History  Diagnosis Date  . Hyperlipidemia   . Ischemic cardiomyopathy     a. EF 40% 2010. b. Echo (2/14):  mild LVH, EF 30-35%, diff HK, Gr 1 DD, MAC, mild MR, mod LAE, PASP 39  . Cataract   . Hypotension   . Paroxysmal atrial fibrillation   . Peripheral vascular disease     a. s/p L BKA 2014.  [name] History of blood clots     in legs--this was in 2011--has been off of Coumadin 36months;takes Xarelto daily  . Joint pain   . H/O hiatal hernia   . Chronic kidney disease     on Lisinopril to protect kidneys d/t being on Metformin per pt  . Myocardial infarction     total of 8 heart attacks;last one in 2011  . Atrial flutter   . H/O medication noncompliance     Due to insurance issues  . Chronic systolic CHF (congestive heart failure)   . Coronary artery disease     a. s/p remote CABG 2001 at [facility] b. Cath 03/2010: for med rx.;  c.  [name] myoview (1/14):  Large Inferior apical MI, very small area of peri-infarct ischemia toward the inferior apical segment. EF 19%.  Med Rx was continued.    . Diabetes mellitus 1979    takes Metformni daily  and Lantus 50units bid  . DVT (deep venous thrombosis) 2011    legs  . Peripheral neuropathy    . Shortness of breath dyspnea     Past Surgical History  Procedure Laterality Date  . Revasculariztion  2011/2010    x 2   . Tonsillectomy    . Cardiac catheterization  03/19/10  . Abdominal aortagram  04-30-12  . Adenoidectomy    . Amputation Left 06/03/2012    Procedure: AMPUTATION BELOW KNEE;  Surgeon: [name], MD;  Location: [facility] OR;  Service: Vascular;  Laterality: Left;  . Coronary angioplasty  2002    3 stents  . Endarterectomy femoral Right 03/25/2013    Procedure: ENDARTERECTOMY FEMORAL ;  Surgeon: [name], MD;  Location: [facility] OR;  Service: Vascular;  Laterality: Right;  . Patch angioplasty Right 03/25/2013    Procedure: PATCH ANGIOPLASTY USING VASCU-GUARD PERIPHERAL VASCULAR PATCH;  Surgeon: [name], MD;  Location: [facility] OR;  Service: Vascular;  Laterality: Right;  . Amputation Right 04/14/2013    Procedure: AMPUTATION BELOW KNEE ;  Surgeon: [name], MD;  Location: [facility] OR;  Service: Vascular;  Laterality: Right;  . I&d extremity Right 04/14/2013    Procedure: IRRIGATION AND DEBRIDEMENT OF RIGHT  GROIN & PLACEMENT OF VAC DRESSING;  Surgeon: [name], MD;  Location: [facility] OR;  Service: Vascular;  Laterality: Right;  . Abdominal aortagram N/A 04/30/2012    Procedure: ABDOMINAL AORTAGRAM;  Surgeon: [name], MD;  Location: [facility] CATH LAB;  Service: Cardiovascular;  Laterality: N/A;  . Lower extremity angiogram Bilateral 04/30/2012    Procedure: LOWER EXTREMITY ANGIOGRAM;  Surgeon: [name], MD;  Location: [facility] CATH LAB;  Service: Cardiovascular;  Laterality: Bilateral;  bilat lower extrem angio  . Eye surgery Right     Catarct  . Coronary artery bypass graft  03/2000    quadruple  . Hernia repair      Hiatal Hernia  . Wound debridement Right 04/20/2014    s/p  rt bka  with application of wound vac  . I&d extremity Right 04/20/2014    Procedure: DEBRIDEMENT OF RIGHT BELOW KNEE AMPUTATION STUMP;  Surgeon: [name], MD;  Location: [facility] OR;  Service: Vascular;   Laterality: Right;  . Application of wound vac Right 04/20/2014    Procedure: APPLICATION OF WOUND VAC;  Surgeon: [name], MD;  Location: [facility] OR;  Service: Vascular;  Laterality: Right;    There were no vitals filed for this visit.  Visit Diagnosis:  Abnormality of gait  Unsteadiness  Balance problems  Weakness generalized  Status post bilateral below knee amputation      Subjective Assessment - 11/28/14 0933    Subjective He is having eye surgery tomorrow. No falls.   Currently in Pain? No/denies     Therapeutic Exercise: See patient education                    [facility] Adult PT Treatment/Exercise - 11/28/14 0930    Transfers   Sit to Stand 5: Supervision;With upper extremity assist;With armrests;From chair/3-in-1  to rollator walker for stabliization   Sit to Stand Details (indicate cue type and reason) cues on weight shift   Stand to Sit 5: Supervision;With upper extremity assist;With armrests;To chair/3-in-1  from rollator walker   Stand to Sit Details cues on technique   Ambulation/Gait   Ambulation/Gait Yes   Ambulation/Gait Assistance 4: Min guard   Ambulation Distance (Feet) 50 Feet  50' X 2, 85'   Assistive device Rollator;Prostheses   Gait Pattern Step-through pattern;Decreased step length - left;Decreased stance time - right;Decreased stride length;Decreased hip/knee flexion - right;Decreased weight shift to right;Right flexed knee in stance;Trunk flexed;Wide base of support;Poor foot clearance - right   Ambulation Surface Indoor;Level   Ramp 4: Min assist  rollator walker & prostheses   Ramp Details (indicate cue type and reason) PT demo prior, verbal & manual cues on technique during   Curb 4: Min assist  rollator walker & prostheses   Curb Details (indicate cue type and reason) PT demo prior, verbal & manual cues during on technique   Prosthetics   Prosthetic Care Comments  reviewed signs of sweating & need to dry   Current prosthetic  wear tolerance (days/week)  daily   Current prosthetic wear tolerance (#hours/day)  wearing both 5-6 hours a day, 2x/day   Residual limb condition  right limb with no signs of excessive sweat / moisture today   Education Provided Residual limb care;Proper wear schedule/adjustment  HEP for lower extremity strengthening   Person(s) Educated Patient   Education Method Explanation;Verbal cues   Education Method Verbalized understanding;Verbal cues required;Needs further instruction   Donning Prosthesis Supervision   Doffing Prosthesis Modified independent (device/increased time)  PT Education - 11/28/14 0930    Education provided Yes   Education Details standing at door frame reaching single UE overhead then extending LEs to rise higher, seated in w/c & supine with feet against wall closed chain knee extension, positioning right knee in sitting   Person(s) Educated Patient   Methods Explanation;Demonstration;Verbal cues   Comprehension Verbalized understanding;Returned demonstration;Verbal cues required;Need further instruction          PT Short Term Goals - 11/14/14 1100    PT SHORT TERM GOAL #1   Title Patient tolerates wear of bilateral prostheses >10 hours total per day without skin issues or tenderness. (Target Date: 12/14/2014)   Time 1   Period Months   Status New   PT SHORT TERM GOAL #2   Title patient verbalizes signs of sweating in prostheses and proper drying to prevent skin issues. (Target Date: 12/14/2014)   Time 1   Period Months   Status New   PT SHORT TERM GOAL #3   Title Patient ambulates 250' with rollator walker & prostheses with supervision. (Target Date: 12/14/2014)   Time 1   Period Months   Status New   PT SHORT TERM GOAL #4   Title Patient negotiates ramps & curbs with rollator walker & prostheses with supervision. (Target Date: 12/14/2014)   Time 1   Period Months   Status New           PT Long Term Goals - 11/14/14 1100    PT  LONG TERM GOAL #1   Title Patient tolerates wear of bilateral TTA prostheses >90% of awake hours without skin issues or tenderness to enable function throughout his day. (Target Date: 01/13/2015)   Time 2   Period Months   Status New   PT LONG TERM GOAL #2   Title patient verbalizes proper prosthetic care including adjusting ply socks to enable safe prosthetic use.  (Target Date: 01/13/2015)   Time 2   Period Months   Status New   PT LONG TERM GOAL #3   Title Berg Balance >30/56  (Target Date: 01/13/2015)   Time 2   Period Months   Status New   PT LONG TERM GOAL #4   Title Patient ambulates >300' with LRAD & prostheses modified independent to enable community access.  (Target Date: 01/13/2015)   Time 2   Period Months   Status New   PT LONG TERM GOAL #5   Title Patient negotiates ramps, curbs & stairs (1 rail) with LRAD & prostheses modified independent for community access.  (Target Date: 01/13/2015)   Time 2   Period Months   Status New               Plan - 11/28/14 0930    Clinical Impression Statement Patient appears to understand updated HEP to work on right knee extension. Patient improved gait with rollator walker including ramps & curbs with instruction & practice. He needs further instructions for safety..    Pt will benefit from skilled therapeutic intervention in order to improve on the following deficits Abnormal gait;Decreased activity tolerance;Decreased balance;Decreased knowledge of precautions;Decreased knowledge of use of DME;Decreased strength;Postural dysfunction;Prosthetic Dependency   Rehab Potential Good   PT Frequency 2x / week   PT Duration Other (comment)  9 weeks (60 days)   PT Treatment/Interventions ADLs/Self Care Home Management;DME Instruction;Gait training;Stair training;Functional mobility training;Therapeutic activities;Therapeutic exercise;Balance training;Neuromuscular re-education;Patient/family education;Prosthetic Training   PT Next Visit  Plan continue towards STGs, gait with rollator walker incuding  ramps & curbs   Consulted and Agree with Plan of Care Patient        Problem List Patient Active Problem List   Diagnosis Date Noted  . Microalbuminuria 09/29/2014  . 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 Phantom limb pain 08/16/2013  . Pain in limb- Right stump 07/06/2013  . Atherosclerosis of native arteries of the extremities with ulceration 06/25/2013  . S/P bilateral BKA (below knee amputation) 04/21/2013  . Chronic systolic heart failure 04/15/2013  . 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 S/P BKA (below knee amputation) bilateral 09/10/2012  . SVT (supraventricular tachycardia) 09/02/2012  . Hyperlipidemia with target LDL less than 70 01/02/2012  . Type II diabetes mellitus with manifestations, uncontrolled 05/03/2010  . Coronary atherosclerosis 04/13/2010  . ATRIAL FIBRILLATION, PAROXYSMAL 04/13/2010    Vladimir Faster PT, DPT 11/28/2014, 11:43 AM  [facility] [address] [place], [place], [address] Phone: [phone]   Fax:  [phone]

## 2014-11-29 DIAGNOSIS — H25812 Combined forms of age-related cataract, left eye: Secondary | ICD-10-CM | POA: Diagnosis not present

## 2014-11-29 DIAGNOSIS — H2512 Age-related nuclear cataract, left eye: Secondary | ICD-10-CM | POA: Diagnosis not present

## 2014-11-29 DIAGNOSIS — H25042 Posterior subcapsular polar age-related cataract, left eye: Secondary | ICD-10-CM | POA: Diagnosis not present

## 2014-11-29 DIAGNOSIS — H25012 Cortical age-related cataract, left eye: Secondary | ICD-10-CM | POA: Diagnosis not present

## 2014-12-01 ENCOUNTER — Ambulatory Visit: Payer: Commercial Managed Care - HMO | Admitting: Physical Therapy

## 2014-12-01 DIAGNOSIS — Z89511 Acquired absence of right leg below knee: Secondary | ICD-10-CM

## 2014-12-01 DIAGNOSIS — Z89512 Acquired absence of left leg below knee: Principal | ICD-10-CM

## 2014-12-01 NOTE — Therapy (Signed)
[facility] [address] [place], [place], [address] Phone: [phone]   Fax:  [phone]  Physical Therapy Treatment  Patient Details  Name: Leroy [name] MRN: [id number] Date of Birth: Apr 11, 1945 Referring Provider:  Etta Grandchild, MD  Encounter Date: 12/01/2014    Past Medical History  Diagnosis Date  . Hyperlipidemia   . Ischemic cardiomyopathy     a. EF 40% 2010. b. Echo (2/14):  mild LVH, EF 30-35%, diff HK, Gr 1 DD, MAC, mild MR, mod LAE, PASP 39  . Cataract   . Hypotension   . Paroxysmal atrial fibrillation   . Peripheral vascular disease     a. s/p L BKA 2014.  [name] History of blood clots     in legs--this was in 2011--has been off of Coumadin 27months;takes Xarelto daily  . Joint pain   . H/O hiatal hernia   . Chronic kidney disease     on Lisinopril to protect kidneys d/t being on Metformin per pt  . Myocardial infarction     total of 8 heart attacks;last one in 2011  . Atrial flutter   . H/O medication noncompliance     Due to insurance issues  . Chronic systolic CHF (congestive heart failure)   . Coronary artery disease     a. s/p remote CABG 2001 at [facility] b. Cath 03/2010: for med rx.;  c.  [name] myoview (1/14):  Large Inferior apical MI, very small area of peri-infarct ischemia toward the inferior apical segment. EF 19%.  Med Rx was continued.    . Diabetes mellitus 1979    takes Metformni daily  and Lantus 50units bid  . DVT (deep venous thrombosis) 2011    legs  . Peripheral neuropathy   . Shortness of breath dyspnea     Past Surgical History  Procedure Laterality Date  . Revasculariztion  2011/2010    x 2   . Tonsillectomy    . Cardiac catheterization  03/19/10  . Abdominal aortagram  04-30-12  . Adenoidectomy    . Amputation Left 06/03/2012    Procedure: AMPUTATION BELOW KNEE;  Surgeon: [name], MD;  Location: [facility] OR;  Service: Vascular;  Laterality: Left;  . Coronary  angioplasty  2002    3 stents  . Endarterectomy femoral Right 03/25/2013    Procedure: ENDARTERECTOMY FEMORAL ;  Surgeon: [name], MD;  Location: [facility] OR;  Service: Vascular;  Laterality: Right;  . Patch angioplasty Right 03/25/2013    Procedure: PATCH ANGIOPLASTY USING VASCU-GUARD PERIPHERAL VASCULAR PATCH;  Surgeon: [name], MD;  Location: [facility] OR;  Service: Vascular;  Laterality: Right;  . Amputation Right 04/14/2013    Procedure: AMPUTATION BELOW KNEE ;  Surgeon: [name], MD;  Location: [facility] OR;  Service: Vascular;  Laterality: Right;  . I&d extremity Right 04/14/2013    Procedure: IRRIGATION AND DEBRIDEMENT OF RIGHT  GROIN & PLACEMENT OF VAC DRESSING;  Surgeon: [name], MD;  Location: [facility] OR;  Service: Vascular;  Laterality: Right;  . Abdominal aortagram N/A 04/30/2012    Procedure: ABDOMINAL Ronny Flurry;  Surgeon: [name], MD;  Location: [facility] CATH LAB;  Service: Cardiovascular;  Laterality: N/A;  . Lower extremity angiogram Bilateral 04/30/2012    Procedure: LOWER EXTREMITY ANGIOGRAM;  Surgeon: [name], MD;  Location: [facility] CATH LAB;  Service: Cardiovascular;  Laterality: Bilateral;  bilat lower extrem angio  . Eye surgery Right     Catarct  .  Coronary artery bypass graft  03/2000    quadruple  . Hernia repair      Hiatal Hernia  . Wound debridement Right 04/20/2014    s/p  rt bka  with application of wound vac  . I&d extremity Right 04/20/2014    Procedure: DEBRIDEMENT OF RIGHT BELOW KNEE AMPUTATION STUMP;  Surgeon: [name], MD;  Location: [facility] OR;  Service: Vascular;  Laterality: Right;  . Application of wound vac Right 04/20/2014    Procedure: APPLICATION OF WOUND VAC;  Surgeon: [name], MD;  Location: [facility] OR;  Service: Vascular;  Laterality: Right;    There were no vitals filed for this visit.  Visit Diagnosis:  Status post bilateral below knee amputation  Patient arrived for PT appointment. He reports he had cataract surgery Tuesday and is not supposed to  strain or lift anything heavier than gallon of milk for a few days.  PT held session as standing & gait with bilateral prostheses would strain him too much.  PT recommended calling MD to get clarification on time frame for restrictions and no PT until after that time. Pt verbalized understanding.                                PT Short Term Goals - 11/14/14 1100    PT SHORT TERM GOAL #1   Title Patient tolerates wear of bilateral prostheses >10 hours total per day without skin issues or tenderness. (Target Date: 12/14/2014)   Time 1   Period Months   Status New   PT SHORT TERM GOAL #2   Title patient verbalizes signs of sweating in prostheses and proper drying to prevent skin issues. (Target Date: 12/14/2014)   Time 1   Period Months   Status New   PT SHORT TERM GOAL #3   Title Patient ambulates 250' with rollator walker & prostheses with supervision. (Target Date: 12/14/2014)   Time 1   Period Months   Status New   PT SHORT TERM GOAL #4   Title Patient negotiates ramps & curbs with rollator walker & prostheses with supervision. (Target Date: 12/14/2014)   Time 1   Period Months   Status New           PT Long Term Goals - 11/14/14 1100    PT LONG TERM GOAL #1   Title Patient tolerates wear of bilateral TTA prostheses >90% of awake hours without skin issues or tenderness to enable function throughout his day. (Target Date: 01/13/2015)   Time 2   Period Months   Status New   PT LONG TERM GOAL #2   Title patient verbalizes proper prosthetic care including adjusting ply socks to enable safe prosthetic use.  (Target Date: 01/13/2015)   Time 2   Period Months   Status New   PT LONG TERM GOAL #3   Title Berg Balance >30/56  (Target Date: 01/13/2015)   Time 2   Period Months   Status New   PT LONG TERM GOAL #4   Title Patient ambulates >300' with LRAD & prostheses modified independent to enable community access.  (Target Date: 01/13/2015)   Time 2   Period  Months   Status New   PT LONG TERM GOAL #5   Title Patient negotiates ramps, curbs & stairs (1 rail) with LRAD & prostheses modified independent for community access.  (Target Date: 01/13/2015)   Time 2   Period  Months   Status New               Problem List Patient Active Problem List   Diagnosis Date Noted  . Microalbuminuria 09/29/2014  . 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 Phantom limb pain 08/16/2013  . Pain in limb- Right stump 07/06/2013  . Atherosclerosis of native arteries of the extremities with ulceration 06/25/2013  . S/P bilateral BKA (below knee amputation) 04/21/2013  . Chronic systolic heart failure 04/15/2013  . 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 S/P BKA (below knee amputation) bilateral 09/10/2012  . SVT (supraventricular tachycardia) 09/02/2012  . Hyperlipidemia with target LDL less than 70 01/02/2012  . Type II diabetes mellitus with manifestations, uncontrolled 05/03/2010  . Coronary atherosclerosis 04/13/2010  . ATRIAL FIBRILLATION, PAROXYSMAL 04/13/2010    Vladimir Faster PT, DPT 12/01/2014, 1:35 PM  [facility] [address] [place], [place], [address] Phone: [phone]   Fax:  [phone]

## 2014-12-02 ENCOUNTER — Telehealth: Payer: Self-pay

## 2014-12-02 ENCOUNTER — Other Ambulatory Visit: Payer: Self-pay | Admitting: Internal Medicine

## 2014-12-02 MED ORDER — INSULIN PEN NEEDLE 31G X 5 MM MISC: Status: DC

## 2014-12-02 NOTE — Telephone Encounter (Signed)
Returning pt phone per Truddie Hidden instructions. Pt left a message on her voicemail about a script that was sent in? Pt has not been seen here since June of 2016 . I do not see where there have been any refills filled. If pt calls back can we get some clarification please. Thank you

## 2014-12-02 NOTE — Telephone Encounter (Signed)
Spoke with pt he states he has been receiving syringes from [facility] but no needles. Both of his insulin medications are pens and not vial per pt and he needs the needles for the pen sent in. Script has been sent in

## 2014-12-07 ENCOUNTER — Encounter: Payer: Self-pay | Admitting: Physical Therapy

## 2014-12-07 ENCOUNTER — Ambulatory Visit: Payer: Commercial Managed Care - HMO | Admitting: Physical Therapy

## 2014-12-07 DIAGNOSIS — R269 Unspecified abnormalities of gait and mobility: Secondary | ICD-10-CM

## 2014-12-07 DIAGNOSIS — R2689 Other abnormalities of gait and mobility: Secondary | ICD-10-CM

## 2014-12-07 DIAGNOSIS — Z4789 Encounter for other orthopedic aftercare: Secondary | ICD-10-CM

## 2014-12-07 DIAGNOSIS — Z5189 Encounter for other specified aftercare: Secondary | ICD-10-CM | POA: Diagnosis not present

## 2014-12-07 DIAGNOSIS — R531 Weakness: Secondary | ICD-10-CM

## 2014-12-07 DIAGNOSIS — Z89512 Acquired absence of left leg below knee: Secondary | ICD-10-CM | POA: Diagnosis not present

## 2014-12-07 DIAGNOSIS — R2681 Unsteadiness on feet: Secondary | ICD-10-CM | POA: Diagnosis not present

## 2014-12-07 DIAGNOSIS — R29818 Other symptoms and signs involving the nervous system: Secondary | ICD-10-CM | POA: Diagnosis not present

## 2014-12-07 DIAGNOSIS — M24659 Ankylosis, unspecified hip: Secondary | ICD-10-CM | POA: Diagnosis not present

## 2014-12-07 DIAGNOSIS — Z89511 Acquired absence of right leg below knee: Secondary | ICD-10-CM

## 2014-12-08 NOTE — Therapy (Signed)
[facility] [address] [place], [place], [address] Phone: [phone]   Fax:  [phone]  Physical Therapy Treatment  Patient Details  Name: Leroy [name] MRN: [id number] Date of Birth: 01-23-1946 Referring Provider:  Etta Grandchild, MD  Encounter Date: 12/07/2014      PT End of Session - 12/07/14 1315    Visit Number 6   Number of Visits 18   Date for PT Re-Evaluation 01/13/15   Authorization Type G-code every 10th visit   PT Start Time 1315   PT Stop Time 1400   PT Time Calculation (min) 45 min   Equipment Utilized During Treatment Gait belt   Activity Tolerance Patient tolerated treatment well   Behavior During Therapy [facility] for tasks assessed/performed      Past Medical History  Diagnosis Date  . Hyperlipidemia   . Ischemic cardiomyopathy     a. EF 40% 2010. b. Echo (2/14):  mild LVH, EF 30-35%, diff HK, Gr 1 DD, MAC, mild MR, mod LAE, PASP 39  . Cataract   . Hypotension   . Paroxysmal atrial fibrillation   . Peripheral vascular disease     a. s/p L BKA 2014.  [name] History of blood clots     in legs--this was in 2011--has been off of Coumadin 52months;takes Xarelto daily  . Joint pain   . H/O hiatal hernia   . Chronic kidney disease     on Lisinopril to protect kidneys d/t being on Metformin per pt  . Myocardial infarction     total of 8 heart attacks;last one in 2011  . Atrial flutter   . H/O medication noncompliance     Due to insurance issues  . Chronic systolic CHF (congestive heart failure)   . Coronary artery disease     a. s/p remote CABG 2001 at [facility] b. Cath 03/2010: for med rx.;  c.  [name] myoview (1/14):  Large Inferior apical MI, very small area of peri-infarct ischemia toward the inferior apical segment. EF 19%.  Med Rx was continued.    . Diabetes mellitus 1979    takes Metformni daily  and Lantus 50units bid  . DVT (deep venous thrombosis) 2011    legs  . Peripheral neuropathy    . Shortness of breath dyspnea     Past Surgical History  Procedure Laterality Date  . Revasculariztion  2011/2010    x 2   . Tonsillectomy    . Cardiac catheterization  03/19/10  . Abdominal aortagram  04-30-12  . Adenoidectomy    . Amputation Left 06/03/2012    Procedure: AMPUTATION BELOW KNEE;  Surgeon: [name], MD;  Location: [facility] OR;  Service: Vascular;  Laterality: Left;  . Coronary angioplasty  2002    3 stents  . Endarterectomy femoral Right 03/25/2013    Procedure: ENDARTERECTOMY FEMORAL ;  Surgeon: [name], MD;  Location: [facility] OR;  Service: Vascular;  Laterality: Right;  . Patch angioplasty Right 03/25/2013    Procedure: PATCH ANGIOPLASTY USING VASCU-GUARD PERIPHERAL VASCULAR PATCH;  Surgeon: [name], MD;  Location: [facility] OR;  Service: Vascular;  Laterality: Right;  . Amputation Right 04/14/2013    Procedure: AMPUTATION BELOW KNEE ;  Surgeon: [name], MD;  Location: [facility] OR;  Service: Vascular;  Laterality: Right;  . I&d extremity Right 04/14/2013    Procedure: IRRIGATION AND DEBRIDEMENT OF RIGHT  GROIN & PLACEMENT OF VAC DRESSING;  Surgeon: [name], MD;  Location: [facility] OR;  Service: Vascular;  Laterality: Right;  . Abdominal aortagram N/A 04/30/2012    Procedure: ABDOMINAL AORTAGRAM;  Surgeon: [name], MD;  Location: [facility] CATH LAB;  Service: Cardiovascular;  Laterality: N/A;  . Lower extremity angiogram Bilateral 04/30/2012    Procedure: LOWER EXTREMITY ANGIOGRAM;  Surgeon: [name], MD;  Location: [facility] CATH LAB;  Service: Cardiovascular;  Laterality: Bilateral;  bilat lower extrem angio  . Eye surgery Right     Catarct  . Coronary artery bypass graft  03/2000    quadruple  . Hernia repair      Hiatal Hernia  . Wound debridement Right 04/20/2014    s/p  rt bka  with application of wound vac  . I&d extremity Right 04/20/2014    Procedure: DEBRIDEMENT OF RIGHT BELOW KNEE AMPUTATION STUMP;  Surgeon: [name], MD;  Location: [facility] OR;  Service: Vascular;   Laterality: Right;  . Application of wound vac Right 04/20/2014    Procedure: APPLICATION OF WOUND VAC;  Surgeon: [name], MD;  Location: [facility] OR;  Service: Vascular;  Laterality: Right;    There were no vitals filed for this visit.  Visit Diagnosis:  Status post bilateral below knee amputation  Abnormality of gait  Unsteadiness  Balance problems  Weakness generalized  Encounter for prosthetic gait training      Subjective Assessment - 12/07/14 1319    Subjective His eye doctor cleared him to resume PT & regular duties.   Currently in Pain? No/denies                         [facility] Adult PT Treatment/Exercise - 12/07/14 1315    Transfers   Sit to Stand 5: Supervision;With upper extremity assist;With armrests;From chair/3-in-1  to rollator walker for stabliization   Sit to Stand Details Verbal cues for safe use of DME/AE;Verbal cues for technique  cues with demo how to arise from chair without armrests   Stand to Sit 5: Supervision;With upper extremity assist;With armrests;To chair/3-in-1  from rollator walker   Stand to Sit Details (indicate cue type and reason) Verbal cues for safe use of DME/AE;Verbal cues for technique  demo, verbal cues on sitting in chair without armrests   Ambulation/Gait   Ambulation/Gait Yes   Ambulation/Gait Assistance 5: Supervision   Ambulation/Gait Assistance Details verbal cues on safety with DME, posture & step width   Ambulation Distance (Feet) 175 Feet  175' X 2, 75' X 1   Assistive device Rollator;Prostheses   Gait Pattern Step-through pattern;Decreased step length - left;Decreased stance time - right;Decreased stride length;Decreased hip/knee flexion - right;Decreased weight shift to right;Right flexed knee in stance;Trunk flexed;Wide base of support;Poor foot clearance - right   Ambulation Surface Indoor;Level   Ramp 4: Min assist  rollator walker & prostheses   Ramp Details (indicate cue type and reason) verbal cues  on technique   Curb 4: Min assist  rollator walker & prostheses   Curb Details (indicate cue type and reason) verbal, visual cues on technique   Prosthetics   Prosthetic Care Comments  instructed in use of clinical strength anti-perspirant 2 nights/wk and std strength dailly during day.   Current prosthetic wear tolerance (days/week)  daily   Current prosthetic wear tolerance (#hours/day)  wearing all awake hours drying q3-4 hrs   Residual limb condition  intact with normal moisture levels   Education Provided Residual limb care;Proper wear schedule/adjustment   Person(s) Educated Patient  Education Method Explanation;Verbal cues   Education Method Verbalized understanding;Verbal cues required;Needs further instruction   Donning Prosthesis Modified independent (device/increased time)   Doffing Prosthesis Modified independent (device/increased time)                  PT Short Term Goals - 11/14/14 1100    PT SHORT TERM GOAL #1   Title Patient tolerates wear of bilateral prostheses >10 hours total per day without skin issues or tenderness. (Target Date: 12/14/2014)   Time 1   Period Months   Status New   PT SHORT TERM GOAL #2   Title patient verbalizes signs of sweating in prostheses and proper drying to prevent skin issues. (Target Date: 12/14/2014)   Time 1   Period Months   Status New   PT SHORT TERM GOAL #3   Title Patient ambulates 250' with rollator walker & prostheses with supervision. (Target Date: 12/14/2014)   Time 1   Period Months   Status New   PT SHORT TERM GOAL #4   Title Patient negotiates ramps & curbs with rollator walker & prostheses with supervision. (Target Date: 12/14/2014)   Time 1   Period Months   Status New           PT Long Term Goals - 11/14/14 1100    PT LONG TERM GOAL #1   Title Patient tolerates wear of bilateral TTA prostheses >90% of awake hours without skin issues or tenderness to enable function throughout his day. (Target Date:  01/13/2015)   Time 2   Period Months   Status New   PT LONG TERM GOAL #2   Title patient verbalizes proper prosthetic care including adjusting ply socks to enable safe prosthetic use.  (Target Date: 01/13/2015)   Time 2   Period Months   Status New   PT LONG TERM GOAL #3   Title Berg Balance >30/56  (Target Date: 01/13/2015)   Time 2   Period Months   Status New   PT LONG TERM GOAL #4   Title Patient ambulates >300' with LRAD & prostheses modified independent to enable community access.  (Target Date: 01/13/2015)   Time 2   Period Months   Status New   PT LONG TERM GOAL #5   Title Patient negotiates ramps, curbs & stairs (1 rail) with LRAD & prostheses modified independent for community access.  (Target Date: 01/13/2015)   Time 2   Period Months   Status New               Plan - 12/07/14 1315    Clinical Impression Statement Patient improved ability to stand from chairs without armrests pushing with UEs with instruction & repetition. Patient ambulated further with rollator walker without fatigue. He is on target to meet STGs next week.   Pt will benefit from skilled therapeutic intervention in order to improve on the following deficits Abnormal gait;Decreased activity tolerance;Decreased balance;Decreased knowledge of precautions;Decreased knowledge of use of DME;Decreased strength;Postural dysfunction;Prosthetic Dependency   Rehab Potential Good   PT Frequency 2x / week   PT Duration Other (comment)  9 weeks (60 days)   PT Treatment/Interventions ADLs/Self Care Home Management;DME Instruction;Gait training;Stair training;Functional mobility training;Therapeutic activities;Therapeutic exercise;Balance training;Neuromuscular re-education;Patient/family education;Prosthetic Training   PT Next Visit Plan assess STGs, gait with rollator walker incuding ramps & curbs   Consulted and Agree with Plan of Care Patient        Problem List Patient Active Problem List   Diagnosis  Date  Noted  . Microalbuminuria 09/29/2014  . 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 Phantom limb pain 08/16/2013  . Pain in limb- Right stump 07/06/2013  . Atherosclerosis of native arteries of the extremities with ulceration 06/25/2013  . S/P bilateral BKA (below knee amputation) 04/21/2013  . Chronic systolic heart failure 04/15/2013  . 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 S/P BKA (below knee amputation) bilateral 09/10/2012  . SVT (supraventricular tachycardia) 09/02/2012  . Hyperlipidemia with target LDL less than 70 01/02/2012  . Type II diabetes mellitus with manifestations, uncontrolled 05/03/2010  . Coronary atherosclerosis 04/13/2010  . ATRIAL FIBRILLATION, PAROXYSMAL 04/13/2010    Vladimir Faster PT, DPT 12/08/2014, 8:01 AM  [facility] [address] [place], [place], [address] Phone: [phone]   Fax:  [phone]

## 2014-12-13 ENCOUNTER — Encounter: Payer: Self-pay | Admitting: Physical Therapy

## 2014-12-13 ENCOUNTER — Ambulatory Visit: Payer: Commercial Managed Care - HMO | Attending: Vascular Surgery | Admitting: Physical Therapy

## 2014-12-13 DIAGNOSIS — Z89511 Acquired absence of right leg below knee: Secondary | ICD-10-CM | POA: Insufficient documentation

## 2014-12-13 DIAGNOSIS — Z89512 Acquired absence of left leg below knee: Secondary | ICD-10-CM | POA: Diagnosis not present

## 2014-12-13 DIAGNOSIS — R269 Unspecified abnormalities of gait and mobility: Secondary | ICD-10-CM | POA: Diagnosis not present

## 2014-12-13 DIAGNOSIS — R2689 Other abnormalities of gait and mobility: Secondary | ICD-10-CM

## 2014-12-13 DIAGNOSIS — Z5189 Encounter for other specified aftercare: Secondary | ICD-10-CM | POA: Diagnosis not present

## 2014-12-13 DIAGNOSIS — R29818 Other symptoms and signs involving the nervous system: Secondary | ICD-10-CM | POA: Diagnosis not present

## 2014-12-13 DIAGNOSIS — R531 Weakness: Secondary | ICD-10-CM | POA: Diagnosis not present

## 2014-12-13 DIAGNOSIS — R2681 Unsteadiness on feet: Secondary | ICD-10-CM | POA: Insufficient documentation

## 2014-12-14 NOTE — Therapy (Signed)
[facility] [address] [place], [place], [address] Phone: [phone]   Fax:  [phone]  Physical Therapy Treatment  Patient Details  Name: Leroy [name] MRN: [id number] Date of Birth: 04/30/1945 Referring Provider:  Janith Lima, MD  Encounter Date: 12/13/2014      PT End of Session - 12/13/14 1407    Visit Number 7   Number of Visits 18   Date for PT Re-Evaluation 01/13/15   Authorization Type G-code every 10th visit   PT Start Time 1404   PT Stop Time 1444   PT Time Calculation (min) 40 min   Equipment Utilized During Treatment Gait belt   Activity Tolerance Patient tolerated treatment well   Behavior During Therapy [facility] for tasks assessed/performed      Past Medical History  Diagnosis Date  . Hyperlipidemia   . Ischemic cardiomyopathy     a. EF 40% 2010. b. Echo (2/14):  mild LVH, EF 30-35%, diff HK, Gr 1 DD, MAC, mild MR, mod LAE, PASP 39  . Cataract   . Hypotension   . Paroxysmal atrial fibrillation   . Peripheral vascular disease     a. s/p L BKA 2014.  [name] History of blood clots     in legs--this was in 2011--has been off of Coumadin 59month;takes Xarelto daily  . Joint pain   . H/O hiatal hernia   . Chronic kidney disease     on Lisinopril to protect kidneys d/t being on Metformin per pt  . Myocardial infarction     total of 8 heart attacks;last one in 2011  . Atrial flutter   . H/O medication noncompliance     Due to insurance issues  . Chronic systolic CHF (congestive heart failure)   . Coronary artery disease     a. s/p remote CABG 2001 at [facility] b. Cath 03/2010: for med rx.;  c.  [facility] (1/14):  Large Inferior apical MI, very small area of peri-infarct ischemia toward the inferior apical segment. EF 19%.  Med Rx was continued.    . Diabetes mellitus 1979    takes Metformni daily  and Lantus 50units bid  . DVT (deep venous thrombosis) 2011    legs  . Peripheral neuropathy    . Shortness of breath dyspnea     Past Surgical History  Procedure Laterality Date  . Revasculariztion  2011/2010    x 2   . Tonsillectomy    . Cardiac catheterization  03/19/10  . Abdominal aortagram  04-30-12  . Adenoidectomy    . Amputation Left 06/03/2012    Procedure: AMPUTATION BELOW KNEE;  Surgeon: BConrad Washburn MD;  [facility]  Service: Vascular;  Laterality: Left;  . Coronary angioplasty  2002    3 stents  . Endarterectomy femoral Right 03/25/2013    Procedure: ENDARTERECTOMY FEMORAL ;  Surgeon: BConrad Adwolf MD;  [facility]  Service: Vascular;  Laterality: Right;  . Patch angioplasty Right 03/25/2013    Procedure: PATCH ANGIOPLASTY USING VASCU-GUARD PERIPHERAL VASCULAR PATCH;  Surgeon: BConrad Scanlon MD;  [facility]  Service: Vascular;  Laterality: Right;  . Amputation Right 04/14/2013    Procedure: AMPUTATION BELOW KNEE ;  Surgeon: BConrad [place] MD;  [facility]  Service: Vascular;  Laterality: Right;  . I&d extremity Right 04/14/2013    Procedure: IRRIGATION AND DEBRIDEMENT OF RIGHT  GROIN & PLACEMENT OF VAC DRESSING;  Surgeon: BConrad [place] MD;  [facility];  Service: Vascular;  Laterality: Right;  . Abdominal aortagram N/A 04/30/2012    Procedure: ABDOMINAL AORTAGRAM;  Surgeon: Conrad Wapello, MD;  Location: [facility] CATH LAB;  Service: Cardiovascular;  Laterality: N/A;  . Lower extremity angiogram Bilateral 04/30/2012    Procedure: LOWER EXTREMITY ANGIOGRAM;  Surgeon: Conrad Yamhill, MD;  Location: [facility] CATH LAB;  Service: Cardiovascular;  Laterality: Bilateral;  bilat lower extrem angio  . Eye surgery Right     Catarct  . Coronary artery bypass graft  03/2000    quadruple  . Hernia repair      Hiatal Hernia  . Wound debridement Right 04/20/2014    s/p  rt bka  with application of wound vac  . I&d extremity Right 04/20/2014    Procedure: DEBRIDEMENT OF RIGHT BELOW KNEE AMPUTATION STUMP;  Surgeon: Conrad Butler, MD;  Location: [place];  Service: Vascular;   Laterality: Right;  . Application of wound vac Right 04/20/2014    Procedure: APPLICATION OF WOUND VAC;  Surgeon: Conrad [place], MD;  Location: [facility];  Service: Vascular;  Laterality: Right;    There were no vitals filed for this visit.  Visit Diagnosis:  Status post bilateral below knee amputation  Abnormality of gait  Unsteadiness  Weakness generalized  Balance problems      Subjective Assessment - 12/13/14 1406    Subjective No new complaints. No falls. Some phantom pain. Rubs on the legs to decrease this.   Currently in Pain? Yes   Pain Score 5    Pain Location Leg   Pain Orientation Right   Pain Descriptors / Indicators Aching;Sore;Shooting;Tender   Pain Type Phantom pain   Pain Onset More than a month ago   Pain Frequency Intermittent   Aggravating Factors  increased prosthetic wear   Pain Relieving Factors removing prosthesis, rubbing on leg          [facility] Adult PT Treatment/Exercise - 12/13/14 1412    Transfers   Sit to Stand 5: Supervision;With upper extremity assist;With armrests;From chair/3-in-1   Sit to Stand Details Verbal cues for sequencing;Verbal cues for technique   Sit to Stand Details (indicate cue type and reason) cues to scoot forward and for prosthetic foot placement   Stand to Sit 5: Supervision;With upper extremity assist;With armrests;To chair/3-in-1   Stand to Sit Details (indicate cue type and reason) Verbal cues for technique;Verbal cues for precautions/safety   Stand to Sit Details cues to back up all the way and to use arms to control descent   Ambulation/Gait   Ambulation/Gait Yes   Ambulation/Gait Assistance 5: Supervision;4: Min assist  min assist with last 50 feet of third lap only   Ambulation/Gait Assistance Details cues on posture, walker position with gait/rollator safety. increased left foot scuffing noted with third lap and pt needed increased assistance with last 50 feet of third lap due to increased instabiltiy   Ambulation  Distance (Feet) 220 Feet  100 x 1, 120 x1   Assistive device Rollator;Prostheses   Gait Pattern Step-through pattern;Decreased step length - left;Decreased stance time - right;Decreased stride length;Decreased hip/knee flexion - right;Decreased weight shift to right;Right flexed knee in stance;Trunk flexed;Wide base of support;Poor foot clearance - right   Ambulation Surface Level;Indoor   Prosthetics   Current prosthetic wear tolerance (days/week)  daily   Current prosthetic wear tolerance (#hours/day)  6-7 hours, drying as needed when home, not drying if out running around. dries when he gets home.   Residual  limb condition  intact with no open areas. bruising noted on lateral aspect behind knee. Pt reports this is a tender area and feels like the prosthesis is digging into him when he wears it. Also noted a white area on his patella. Pt reports that was there last week. He reports it's "from the sun cooking his leg when he was outside". Looks more like a friction or sweat wound.                               Education Provided Residual limb care;Correct ply sock adjustment;Proper wear schedule/adjustment   Person(s) Educated Patient   Education Method Explanation   Education Method Verbalized understanding   Donning Prosthesis Modified independent (device/increased time)   Doffing Prosthesis Modified independent (device/increased time)           PT Short Term Goals - 12/13/14 1410    PT SHORT TERM GOAL #1   Title Patient tolerates wear of bilateral prostheses >10 hours total per day without skin issues or tenderness. (Target Date: 12/14/2014)   Status Not Met   PT SHORT TERM GOAL #2   Title patient verbalizes signs of sweating in prostheses and proper drying to prevent skin issues. (Target Date: 12/14/2014)   Baseline 9/6: pt unable to state any, reviewed all three   Time --   Period --   Status Not Met   PT SHORT TERM GOAL #3   Title Patient ambulates 250' with rollator walker &  prostheses with supervision. (Target Date: 12/14/2014)   Baseline 9/6: pt supervision until fatigued, not able to go 250 feet, needs rest break before. Max distance to date is 220 feet.   Time --   Period --   Status Partially Met   PT SHORT TERM GOAL #4   Title Patient negotiates ramps & curbs with rollator walker & prostheses with supervision. (Target Date: 12/14/2014)   Time --   Period --   Status On-going           PT Long Term Goals - 11/14/14 1100    PT LONG TERM GOAL #1   Title Patient tolerates wear of bilateral TTA prostheses >90% of awake hours without skin issues or tenderness to enable function throughout his day. (Target Date: 01/13/2015)   Time 2   Period Months   Status New   PT LONG TERM GOAL #2   Title patient verbalizes proper prosthetic care including adjusting ply socks to enable safe prosthetic use.  (Target Date: 01/13/2015)   Time 2   Period Months   Status New   PT LONG TERM GOAL #3   Title Berg Balance >30/56  (Target Date: 01/13/2015)   Time 2   Period Months   Status New   PT LONG TERM GOAL #4   Title Patient ambulates >300' with LRAD & prostheses modified independent to enable community access.  (Target Date: 01/13/2015)   Time 2   Period Months   Status New   PT LONG TERM GOAL #5   Title Patient negotiates ramps, curbs & stairs (1 rail) with LRAD & prostheses modified independent for community access.  (Target Date: 01/13/2015)   Time 2   Period Months   Status New           Plan - 12/13/14 1408    Clinical Impression Statement Pt has made progress toward goals since evaluation, however has not met any goals STG's at this time.  Continue per POC.   Pt will benefit from skilled therapeutic intervention in order to improve on the following deficits Abnormal gait;Decreased activity tolerance;Decreased balance;Decreased knowledge of precautions;Decreased knowledge of use of DME;Decreased strength;Postural dysfunction;Prosthetic Dependency   Rehab  Potential Good   PT Frequency 2x / week   PT Duration Other (comment)  9 weeks (60 days)   PT Treatment/Interventions ADLs/Self Care Home Management;DME Instruction;Gait training;Stair training;Functional mobility training;Therapeutic activities;Therapeutic exercise;Balance training;Neuromuscular re-education;Patient/family education;Prosthetic Training   PT Next Visit Plan assess remaining STG. gait with rollator, including ramps and curbs.   Consulted and Agree with Plan of Care Patient        Problem List Patient Active Problem List   Diagnosis Date Noted  . Microalbuminuria 09/29/2014  . 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 Phantom limb pain 08/16/2013  . Pain in limb- Right stump 07/06/2013  . Atherosclerosis of native arteries of the extremities with ulceration 06/25/2013  . S/P bilateral BKA (below knee amputation) 04/21/2013  . Chronic systolic heart failure 37/29/0211  . 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 S/P BKA (below knee amputation) bilateral 09/10/2012  . SVT (supraventricular tachycardia) 09/02/2012  . Hyperlipidemia with target LDL less than 70 01/02/2012  . Type II diabetes mellitus with manifestations, uncontrolled 05/03/2010  . Coronary atherosclerosis 04/13/2010  . ATRIAL FIBRILLATION, PAROXYSMAL 04/13/2010    Willow Ora 12/14/2014, 4:54 PM  Willow Ora, PTA, [place] [address], [place], [place] [address] [phone] 12/14/2014, 4:54 PM

## 2014-12-15 ENCOUNTER — Encounter: Payer: Self-pay | Admitting: Physical Therapy

## 2014-12-15 ENCOUNTER — Ambulatory Visit: Payer: Commercial Managed Care - HMO | Admitting: Physical Therapy

## 2014-12-15 DIAGNOSIS — Z89512 Acquired absence of left leg below knee: Principal | ICD-10-CM

## 2014-12-15 DIAGNOSIS — R269 Unspecified abnormalities of gait and mobility: Secondary | ICD-10-CM | POA: Diagnosis not present

## 2014-12-15 DIAGNOSIS — R2681 Unsteadiness on feet: Secondary | ICD-10-CM

## 2014-12-15 DIAGNOSIS — Z89511 Acquired absence of right leg below knee: Secondary | ICD-10-CM | POA: Diagnosis not present

## 2014-12-15 DIAGNOSIS — R29818 Other symptoms and signs involving the nervous system: Secondary | ICD-10-CM | POA: Diagnosis not present

## 2014-12-15 DIAGNOSIS — R2689 Other abnormalities of gait and mobility: Secondary | ICD-10-CM

## 2014-12-15 DIAGNOSIS — R531 Weakness: Secondary | ICD-10-CM

## 2014-12-15 DIAGNOSIS — Z4789 Encounter for other orthopedic aftercare: Secondary | ICD-10-CM

## 2014-12-15 DIAGNOSIS — Z5189 Encounter for other specified aftercare: Secondary | ICD-10-CM | POA: Diagnosis not present

## 2014-12-16 NOTE — Therapy (Signed)
[facility] [address] [place], [place], [address] Phone: [phone]   Fax:  [phone]  Physical Therapy Treatment  Patient Details  Name: Leroy [name] MRN: [id number] Date of Birth: December 14, 1945 Referring Provider:  Janith Lima, MD  Encounter Date: 12/15/2014      PT End of Session - 12/15/14 1400    Visit Number 8   Number of Visits 18   Date for PT Re-Evaluation 01/13/15   Authorization Type G-code every 10th visit   PT Start Time 1400   PT Stop Time 1445   PT Time Calculation (min) 45 min   Equipment Utilized During Treatment Gait belt   Activity Tolerance Patient tolerated treatment well   Behavior During Therapy [facility] for tasks assessed/performed      Past Medical History  Diagnosis Date  . Hyperlipidemia   . Ischemic cardiomyopathy     a. EF 40% 2010. b. Echo (2/14):  mild LVH, EF 30-35%, diff HK, Gr 1 DD, MAC, mild MR, mod LAE, PASP 39  . Cataract   . Hypotension   . Paroxysmal atrial fibrillation   . Peripheral vascular disease     a. s/p L BKA 2014.  [name] History of blood clots     in legs--this was in 2011--has been off of Coumadin 5month;takes Xarelto daily  . Joint pain   . H/O hiatal hernia   . Chronic kidney disease     on Lisinopril to protect kidneys d/t being on Metformin per pt  . Myocardial infarction     total of 8 heart attacks;last one in 2011  . Atrial flutter   . H/O medication noncompliance     Due to insurance issues  . Chronic systolic CHF (congestive heart failure)   . Coronary artery disease     a. s/p remote CABG 2001 at [facility] b. Cath 03/2010: for med rx.;  c.  [facility] (1/14):  Large Inferior apical MI, very small area of peri-infarct ischemia toward the inferior apical segment. EF 19%.  Med Rx was continued.    . Diabetes mellitus 1979    takes Metformni daily  and Lantus 50units bid  . DVT (deep venous thrombosis) 2011    legs  . Peripheral neuropathy    . Shortness of breath dyspnea     Past Surgical History  Procedure Laterality Date  . Revasculariztion  2011/2010    x 2   . Tonsillectomy    . Cardiac catheterization  03/19/10  . Abdominal aortagram  04-30-12  . Adenoidectomy    . Amputation Left 06/03/2012    Procedure: AMPUTATION BELOW KNEE;  Surgeon: BConrad [place] MD;  [facility]  Service: Vascular;  Laterality: Left;  . Coronary angioplasty  2002    3 stents  . Endarterectomy femoral Right 03/25/2013    Procedure: ENDARTERECTOMY FEMORAL ;  Surgeon: BConrad [place] MD;  [facility]  Service: Vascular;  Laterality: Right;  . Patch angioplasty Right 03/25/2013    Procedure: PATCH ANGIOPLASTY USING VASCU-GUARD PERIPHERAL VASCULAR PATCH;  Surgeon: BConrad [place] MD;  [facility]  Service: Vascular;  Laterality: Right;  . Amputation Right 04/14/2013    Procedure: AMPUTATION BELOW KNEE ;  Surgeon: BConrad [place] MD;  [facility]  Service: Vascular;  Laterality: Right;  . I&d extremity Right 04/14/2013    Procedure: IRRIGATION AND DEBRIDEMENT OF RIGHT  GROIN & PLACEMENT OF VAC DRESSING;  Surgeon: BConrad [place] MD;  [facility];  Service: Vascular;  Laterality: Right;  . Abdominal aortagram N/A 04/30/2012    Procedure: ABDOMINAL AORTAGRAM;  Surgeon: Conrad [place], MD;  Location: [facility] CATH LAB;  Service: Cardiovascular;  Laterality: N/A;  . Lower extremity angiogram Bilateral 04/30/2012    Procedure: LOWER EXTREMITY ANGIOGRAM;  Surgeon: Conrad [place], MD;  Location: [facility] CATH LAB;  Service: Cardiovascular;  Laterality: Bilateral;  bilat lower extrem angio  . Eye surgery Right     Catarct  . Coronary artery bypass graft  03/2000    quadruple  . Hernia repair      Hiatal Hernia  . Wound debridement Right 04/20/2014    s/p  rt bka  with application of wound vac  . I&d extremity Right 04/20/2014    Procedure: DEBRIDEMENT OF RIGHT BELOW KNEE AMPUTATION STUMP;  Surgeon: Conrad [place], MD;  Location: [place];  Service: Vascular;   Laterality: Right;  . Application of wound vac Right 04/20/2014    Procedure: APPLICATION OF WOUND VAC;  Surgeon: Conrad [place], MD;  Location: [facility];  Service: Vascular;  Laterality: Right;    There were no vitals filed for this visit.  Visit Diagnosis:  Status post bilateral below knee amputation  Abnormality of gait  Unsteadiness  Weakness generalized  Balance problems  Encounter for prosthetic gait training      Subjective Assessment - 12/15/14 1409    Subjective He has an infection in left eye where he had surgery over a week ago. He has been walking. He feels his "wind" is better.   Currently in Pain? No/denies                         [facility] Adult PT Treatment/Exercise - 12/15/14 1400    Transfers   Sit to Stand 5: Supervision;With upper extremity assist;With armrests;From chair/3-in-1   Sit to Stand Details Verbal cues for sequencing;Verbal cues for technique   Stand to Sit 5: Supervision;With upper extremity assist;With armrests;To chair/3-in-1   Stand to Sit Details (indicate cue type and reason) Verbal cues for technique;Verbal cues for precautions/safety   Ambulation/Gait   Ambulation/Gait Yes   Ambulation/Gait Assistance 5: Supervision;4: Min assist  MinA with cane, SBA Rollator   Ambulation/Gait Assistance Details cues on sequencing with cane   Ambulation Distance (Feet) 220 Feet  220' & 100' with rollator, 94' X 2 with cane   Assistive device Prostheses;Rollator;Straight cane   Gait Pattern Step-through pattern;Decreased step length - left;Decreased stance time - right;Decreased stride length;Decreased hip/knee flexion - right;Decreased weight shift to right;Right flexed knee in stance;Trunk flexed;Wide base of support;Poor foot clearance - right   Ambulation Surface Indoor;Level   Ramp 5: Supervision  rollator & prostheses   Ramp Details (indicate cue type and reason) cues on technique   Curb 5: Supervision  rollator & prostheses   Curb  Details (indicate cue type and reason) cues on technique   Exercises   Exercises Knee/Hip   Knee/Hip Exercises: Standing   Lateral Step Up Right;Left;Step Height: 4";Hand Hold: 2  3 reps each LE leading, using sink for support   Lateral Step Up Limitations demo & verbal cues on wt shift & technique   Forward Step Up Right;Left;Step Height: 4"  3 reps leading with each LE using sink/chairback support   Forward Step Up Limitations demo & verbal cues on wt shift & technique   Step Down Right;Left;Step Height: 4"  3 reps leading with each LE using  sink/chairback   Step Down Limitations demo & verbal cues on wt shift & technique   Other Standing Knee Exercises seated black theraband right leg press, 10 reps  PT demo, instructed technique   Other Standing Knee Exercises seated black theraband knee flexion 10 reps  PT demo, instructed technique   Prosthetics   Current prosthetic wear tolerance (days/week)  daily   Current prosthetic wear tolerance (#hours/day)  most of awake hours drying as needed   Residual limb condition  no open areas   Education Provided Residual limb care;Correct ply sock adjustment;Proper wear schedule/adjustment   Person(s) Educated Patient   Education Method Explanation;Verbal cues   Education Method Verbalized understanding;Verbal cues required;Needs further instruction   Donning Prosthesis Modified independent (device/increased time)   Doffing Prosthesis Modified independent (device/increased time)                PT Education - 12/15/14 1400    Education provided Yes   Education Details knee exercises: 4" step ups laterally and forward up/down, black theraband leg press and seated knee flexion   Person(s) Educated Patient   Methods Explanation;Demonstration;Verbal cues   Comprehension Verbalized understanding;Returned demonstration;Verbal cues required;Need further instruction          PT Short Term Goals - 12/15/14 1400    PT SHORT TERM GOAL #1    Title Patient tolerates wear of bilateral prostheses >10 hours total per day without skin issues or tenderness. (Target Date: 12/14/2014)   Baseline MET 12/15/2014 Patient reports wear >10hrs with no issues.   Status Achieved   PT SHORT TERM GOAL #2   Title patient verbalizes signs of sweating in prostheses and proper drying to prevent skin issues. (Target Date: 12/14/2014)   Baseline MET 12/15/2014   Status Achieved   PT SHORT TERM GOAL #3   Title Patient ambulates 250' with rollator walker & prostheses with supervision. (Target Date: 12/14/2014)   Baseline 9/6: pt supervision until fatigued, not able to go 250 feet, needs rest break before. Max distance to date is 220 feet.   Status Partially Met   PT SHORT TERM GOAL #4   Title Patient negotiates ramps & curbs with rollator walker & prostheses with supervision. (Target Date: 12/14/2014)   Baseline MET 12/15/2014   Status Achieved           PT Long Term Goals - 11/14/14 1100    PT LONG TERM GOAL #1   Title Patient tolerates wear of bilateral TTA prostheses >90% of awake hours without skin issues or tenderness to enable function throughout his day. (Target Date: 01/13/2015)   Time 2   Period Months   Status New   PT LONG TERM GOAL #2   Title patient verbalizes proper prosthetic care including adjusting ply socks to enable safe prosthetic use.  (Target Date: 01/13/2015)   Time 2   Period Months   Status New   PT LONG TERM GOAL #3   Title Berg Balance >30/56  (Target Date: 01/13/2015)   Time 2   Period Months   Status New   PT LONG TERM GOAL #4   Title Patient ambulates >300' with LRAD & prostheses modified independent to enable community access.  (Target Date: 01/13/2015)   Time 2   Period Months   Status New   PT LONG TERM GOAL #5   Title Patient negotiates ramps, curbs & stairs (1 rail) with LRAD & prostheses modified independent for community access.  (Target Date: 01/13/2015)   Time 2  Period Months   Status New                Plan - 12/15/14 1400    Clinical Impression Statement Patient full met 4 of 5 LTGs and progressed towards remaining. He appears to understand HEP for improving knee strength which is primary limiting factor for gait distance.   Pt will benefit from skilled therapeutic intervention in order to improve on the following deficits Abnormal gait;Decreased activity tolerance;Decreased balance;Decreased knowledge of precautions;Decreased knowledge of use of DME;Decreased strength;Postural dysfunction;Prosthetic Dependency   Rehab Potential Good   PT Frequency 2x / week   PT Duration Other (comment)  9 weeks (60 days)   PT Treatment/Interventions ADLs/Self Care Home Management;DME Instruction;Gait training;Stair training;Functional mobility training;Therapeutic activities;Therapeutic exercise;Balance training;Neuromuscular re-education;Patient/family education;Prosthetic Training   PT Next Visit Plan gait with rollator for commuinty including ramps and curbs. Household with cane.   Consulted and Agree with Plan of Care Patient        Problem List Patient Active Problem List   Diagnosis Date Noted  . Microalbuminuria 09/29/2014  . 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 Phantom limb pain 08/16/2013  . Pain in limb- Right stump 07/06/2013  . Atherosclerosis of native arteries of the extremities with ulceration 06/25/2013  . S/P bilateral BKA (below knee amputation) 04/21/2013  . Chronic systolic heart failure 90/38/3338  . 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 S/P BKA (below knee amputation) bilateral 09/10/2012  . SVT (supraventricular tachycardia) 09/02/2012  . Hyperlipidemia with target LDL less than 70 01/02/2012  . Type II diabetes mellitus with manifestations, uncontrolled 05/03/2010  . Coronary atherosclerosis 04/13/2010  . ATRIAL FIBRILLATION, PAROXYSMAL 04/13/2010    Jamey Reas PT, DPT 12/16/2014, 12:43 PM  [facility] [address] [place], [place], [address] Phone: [phone]   Fax:  [phone]

## 2014-12-19 ENCOUNTER — Ambulatory Visit: Payer: Commercial Managed Care - HMO | Admitting: Physical Therapy

## 2014-12-19 ENCOUNTER — Encounter: Payer: Self-pay | Admitting: Physical Therapy

## 2014-12-19 DIAGNOSIS — R2689 Other abnormalities of gait and mobility: Secondary | ICD-10-CM

## 2014-12-19 DIAGNOSIS — R2681 Unsteadiness on feet: Secondary | ICD-10-CM | POA: Diagnosis not present

## 2014-12-19 DIAGNOSIS — R29818 Other symptoms and signs involving the nervous system: Secondary | ICD-10-CM | POA: Diagnosis not present

## 2014-12-19 DIAGNOSIS — R531 Weakness: Secondary | ICD-10-CM

## 2014-12-19 DIAGNOSIS — Z89512 Acquired absence of left leg below knee: Principal | ICD-10-CM

## 2014-12-19 DIAGNOSIS — Z89511 Acquired absence of right leg below knee: Secondary | ICD-10-CM

## 2014-12-19 DIAGNOSIS — R269 Unspecified abnormalities of gait and mobility: Secondary | ICD-10-CM | POA: Diagnosis not present

## 2014-12-19 DIAGNOSIS — Z5189 Encounter for other specified aftercare: Secondary | ICD-10-CM | POA: Diagnosis not present

## 2014-12-19 NOTE — Therapy (Signed)
[facility] [address] [place], [place], [address] Phone: [phone]   Fax:  [phone]  Physical Therapy Treatment  Patient Details  Name: Leroy [name] MRN: [id number] Date of Birth: June 01, 1945 Referring Provider:  Janith Lima, MD  Encounter Date: 12/19/2014      PT End of Session - 12/19/14 1407    Visit Number 9   Number of Visits 18   Date for PT Re-Evaluation 01/13/15   Authorization Type G-code every 10th visit   PT Start Time 1403   PT Stop Time 1445   PT Time Calculation (min) 42 min   Equipment Utilized During Treatment Gait belt   Activity Tolerance Patient tolerated treatment well   Behavior During Therapy [facility] for tasks assessed/performed      Past Medical History  Diagnosis Date  . Hyperlipidemia   . Ischemic cardiomyopathy     a. EF 40% 2010. b. Echo (2/14):  mild LVH, EF 30-35%, diff HK, Gr 1 DD, MAC, mild MR, mod LAE, PASP 39  . Cataract   . Hypotension   . Paroxysmal atrial fibrillation   . Peripheral vascular disease     a. s/p L BKA 2014.  [name] History of blood clots     in legs--this was in 2011--has been off of Coumadin 35month;takes Xarelto daily  . Joint pain   . H/O hiatal hernia   . Chronic kidney disease     on Lisinopril to protect kidneys d/t being on Metformin per pt  . Myocardial infarction     total of 8 heart attacks;last one in 2011  . Atrial flutter   . H/O medication noncompliance     Due to insurance issues  . Chronic systolic CHF (congestive heart failure)   . Coronary artery disease     a. s/p remote CABG 2001 at [facility] b. Cath 03/2010: for med rx.;  c.  [facility] (1/14):  Large Inferior apical MI, very small area of peri-infarct ischemia toward the inferior apical segment. EF 19%.  Med Rx was continued.    . Diabetes mellitus 1979    takes Metformni daily  and Lantus 50units bid  . DVT (deep venous thrombosis) 2011    legs  . Peripheral neuropathy    . Shortness of breath dyspnea     Past Surgical History  Procedure Laterality Date  . Revasculariztion  2011/2010    x 2   . Tonsillectomy    . Cardiac catheterization  03/19/10  . Abdominal aortagram  04-30-12  . Adenoidectomy    . Amputation Left 06/03/2012    Procedure: AMPUTATION BELOW KNEE;  Surgeon: BConrad Leamington MD;  [facility]  Service: Vascular;  Laterality: Left;  . Coronary angioplasty  2002    3 stents  . Endarterectomy femoral Right 03/25/2013    Procedure: ENDARTERECTOMY FEMORAL ;  Surgeon: BConrad Anasco MD;  [facility]  Service: Vascular;  Laterality: Right;  . Patch angioplasty Right 03/25/2013    Procedure: PATCH ANGIOPLASTY USING VASCU-GUARD PERIPHERAL VASCULAR PATCH;  Surgeon: BConrad Cuba MD;  [facility]  Service: Vascular;  Laterality: Right;  . Amputation Right 04/14/2013    Procedure: AMPUTATION BELOW KNEE ;  Surgeon: BConrad Drain MD;  [facility]  Service: Vascular;  Laterality: Right;  . I&d extremity Right 04/14/2013    Procedure: IRRIGATION AND DEBRIDEMENT OF RIGHT  GROIN & PLACEMENT OF VAC DRESSING;  Surgeon: BConrad Lewiston MD;  [facility];  Service: Vascular;  Laterality: Right;  . Abdominal aortagram N/A 04/30/2012    Procedure: ABDOMINAL AORTAGRAM;  Surgeon: Conrad Cleghorn, MD;  Location: [facility] CATH LAB;  Service: Cardiovascular;  Laterality: N/A;  . Lower extremity angiogram Bilateral 04/30/2012    Procedure: LOWER EXTREMITY ANGIOGRAM;  Surgeon: Conrad Venango, MD;  Location: [facility] CATH LAB;  Service: Cardiovascular;  Laterality: Bilateral;  bilat lower extrem angio  . Eye surgery Right     Catarct  . Coronary artery bypass graft  03/2000    quadruple  . Hernia repair      Hiatal Hernia  . Wound debridement Right 04/20/2014    s/p  rt bka  with application of wound vac  . I&d extremity Right 04/20/2014    Procedure: DEBRIDEMENT OF RIGHT BELOW KNEE AMPUTATION STUMP;  Surgeon: Conrad Bellmead, MD;  Location: [facility];  Service: Vascular;   Laterality: Right;  . Application of wound vac Right 04/20/2014    Procedure: APPLICATION OF WOUND VAC;  Surgeon: Conrad Sulphur, MD;  Location: [facility];  Service: Vascular;  Laterality: Right;    There were no vitals filed for this visit.  Visit Diagnosis:  Status post bilateral below knee amputation  Abnormality of gait  Unsteadiness  Weakness generalized  Balance problems      Subjective Assessment - 12/19/14 1406    Subjective No new complaints. No falls. No pain to report.   Currently in Pain? No/denies   Pain Score 0-No pain          [facility] Adult PT Treatment/Exercise - 12/19/14 1407    Transfers   Sit to Stand 5: Supervision;With upper extremity assist;With armrests;From chair/3-in-1   Sit to Stand Details Verbal cues for sequencing;Verbal cues for technique   Stand to Sit 5: Supervision;With upper extremity assist;With armrests;To chair/3-in-1   Stand to Sit Details (indicate cue type and reason) Verbal cues for technique;Verbal cues for precautions/safety   Floor to Transfer 4: Min guard;With upper extremity assist;4: Min assist   Floor to Transfer Details (indicate cue type and reason) pt able to turn himself onto his knees and used chair stabilized by therapist to get himself up into standing. Pt did so by bringing left prosthesis forward and under him to push through along with arms and come up onto other leg as well. pt then has seat in chair behind him as he was leaning posteriorly again.                                  Ambulation/Gait   Ambulation/Gait Yes   Ambulation/Gait Assistance 4: Min guard;5: Supervision   Ambulation/Gait Assistance Details cues on posture, walker posiiton with gait and to correct gait deviations   Ambulation Distance (Feet) 220 Feet  16 x 2   Assistive device Rollator;Prostheses   Gait Pattern Step-through pattern;Decreased step length - left;Decreased stance time - right;Decreased stride length;Decreased hip/knee flexion - right;Decreased  weight shift to right;Right flexed knee in stance;Trunk flexed;Wide base of support;Poor foot clearance - right   Ambulation Surface Level;Indoor   Curb Details (indicate cue type and reason) on approach to go up curb pt with significant posterior loss of balance after bringing rollator over threshold of tile/carpet, unable to correct with total assist, therefore pt lowered with controlled descent to ground with UE assist and therapist assist. see transfer section for floor to stand transfer.  High Level Balance   High Level Balance Activities Marching forwards;Side stepping;Backward walking   High Level Balance Comments x4 laps each in parallel bars   Prosthetics   Prosthetic Care Comments  Going today to see Gerald Stabs at [facility] to have adjustments made to prostheses: rubbing on back of left knee (needs flared/cut as able), and check alingment.                                  Current prosthetic wear tolerance (days/week)  daily   Current prosthetic wear tolerance (#hours/day)  most of awake hours drying as needed   Residual limb condition  no open areas   Education Provided Residual limb care;Proper wear schedule/adjustment;Correct ply sock adjustment   Person(s) Educated Patient   Education Method Explanation;Demonstration   Education Method Verbalized understanding   Donning Prosthesis Modified independent (device/increased time)   Doffing Prosthesis Modified independent (device/increased time)           PT Short Term Goals - 12/15/14 1400    PT SHORT TERM GOAL #1   Title Patient tolerates wear of bilateral prostheses >10 hours total per day without skin issues or tenderness. (Target Date: 12/14/2014)   Baseline MET 12/15/2014 Patient reports wear >10hrs with no issues.   Status Achieved   PT SHORT TERM GOAL #2   Title patient verbalizes signs of sweating in prostheses and proper drying to prevent skin issues. (Target Date: 12/14/2014)   Baseline MET 12/15/2014    Status Achieved   PT SHORT TERM GOAL #3   Title Patient ambulates 250' with rollator walker & prostheses with supervision. (Target Date: 12/14/2014)   Baseline 9/6: pt supervision until fatigued, not able to go 250 feet, needs rest break before. Max distance to date is 220 feet.   Status Partially Met   PT SHORT TERM GOAL #4   Title Patient negotiates ramps & curbs with rollator walker & prostheses with supervision. (Target Date: 12/14/2014)   Baseline MET 12/15/2014   Status Achieved           PT Long Term Goals - 11/14/14 1100    PT LONG TERM GOAL #1   Title Patient tolerates wear of bilateral TTA prostheses >90% of awake hours without skin issues or tenderness to enable function throughout his day. (Target Date: 01/13/2015)   Time 2   Period Months   Status New   PT LONG TERM GOAL #2   Title patient verbalizes proper prosthetic care including adjusting ply socks to enable safe prosthetic use.  (Target Date: 01/13/2015)   Time 2   Period Months   Status New   PT LONG TERM GOAL #3   Title Berg Balance >30/56  (Target Date: 01/13/2015)   Time 2   Period Months   Status New   PT LONG TERM GOAL #4   Title Patient ambulates >300' with LRAD & prostheses modified independent to enable community access.  (Target Date: 01/13/2015)   Time 2   Period Months   Status New   PT LONG TERM GOAL #5   Title Patient negotiates ramps, curbs & stairs (1 rail) with LRAD & prostheses modified independent for community access.  (Target Date: 01/13/2015)   Time 2   Period Months   Status New           Plan - 12/19/14 1407    Clinical Impression Statement Pt more unsteady at times today needing increased assistance  with gait. Pt unable to state any reasons/cause for this. Pt reported no injury post fall in session. Still making progress toward goals.   Pt will benefit from skilled therapeutic intervention in order to improve on the following deficits Abnormal gait;Decreased activity tolerance;Decreased  balance;Decreased knowledge of precautions;Decreased knowledge of use of DME;Decreased strength;Postural dysfunction;Prosthetic Dependency   Rehab Potential Good   PT Frequency 2x / week   PT Duration Other (comment)  9 weeks (60 days)   PT Treatment/Interventions ADLs/Self Care Home Management;DME Instruction;Gait training;Stair training;Functional mobility training;Therapeutic activities;Therapeutic exercise;Balance training;Neuromuscular re-education;Patient/family education;Prosthetic Training   PT Next Visit Plan gait with rollator for commuinty including ramps and curbs. Household with cane.   Consulted and Agree with Plan of Care Patient        Problem List Patient Active Problem List   Diagnosis Date Noted  . Microalbuminuria 09/29/2014  . 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 Phantom limb pain 08/16/2013  . Pain in limb- Right stump 07/06/2013  . Atherosclerosis of native arteries of the extremities with ulceration 06/25/2013  . S/P bilateral BKA (below knee amputation) 04/21/2013  . Chronic systolic heart failure 32/20/1992  . 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 S/P BKA (below knee amputation) bilateral 09/10/2012  . SVT (supraventricular tachycardia) 09/02/2012  . Hyperlipidemia with target LDL less than 70 01/02/2012  . Type II diabetes mellitus with manifestations, uncontrolled 05/03/2010  . Coronary atherosclerosis 04/13/2010  . ATRIAL FIBRILLATION, PAROXYSMAL 04/13/2010    Willow Ora 12/19/2014, 4:25 PM  Willow Ora, PTA, [facility] [address] [place], [place] [address] [phone] 12/19/2014, 4:26 PM

## 2014-12-22 ENCOUNTER — Encounter: Payer: Self-pay | Admitting: Physical Therapy

## 2014-12-22 ENCOUNTER — Ambulatory Visit: Payer: Commercial Managed Care - HMO | Admitting: Physical Therapy

## 2014-12-22 DIAGNOSIS — Z4789 Encounter for other orthopedic aftercare: Secondary | ICD-10-CM

## 2014-12-22 DIAGNOSIS — R531 Weakness: Secondary | ICD-10-CM | POA: Diagnosis not present

## 2014-12-22 DIAGNOSIS — Z89511 Acquired absence of right leg below knee: Secondary | ICD-10-CM

## 2014-12-22 DIAGNOSIS — R2681 Unsteadiness on feet: Secondary | ICD-10-CM | POA: Diagnosis not present

## 2014-12-22 DIAGNOSIS — R2689 Other abnormalities of gait and mobility: Secondary | ICD-10-CM

## 2014-12-22 DIAGNOSIS — Z89512 Acquired absence of left leg below knee: Principal | ICD-10-CM

## 2014-12-22 DIAGNOSIS — Z5189 Encounter for other specified aftercare: Secondary | ICD-10-CM | POA: Diagnosis not present

## 2014-12-22 DIAGNOSIS — R29818 Other symptoms and signs involving the nervous system: Secondary | ICD-10-CM | POA: Diagnosis not present

## 2014-12-22 DIAGNOSIS — R269 Unspecified abnormalities of gait and mobility: Secondary | ICD-10-CM

## 2014-12-23 NOTE — Therapy (Signed)
[facility] [address] [place], [place], [address] Phone: [phone]   Fax:  [phone]  Physical Therapy Treatment  Patient Details  Name: Leroy [name] MRN: [id number] Date of Birth: June 25, 1945 Referring Provider:  Janith Lima, MD  Encounter Date: 12/22/2014      PT End of Session - 12/22/14 1315    Visit Number 10   Number of Visits 18   Date for PT Re-Evaluation 01/13/15   Authorization Type G-code every 10th visit   PT Start Time 1320   PT Stop Time 1400   PT Time Calculation (min) 40 min   Equipment Utilized During Treatment Gait belt   Activity Tolerance Patient tolerated treatment well   Behavior During Therapy [facility] for tasks assessed/performed      Past Medical History  Diagnosis Date  . Hyperlipidemia   . Ischemic cardiomyopathy     a. EF 40% 2010. b. Echo (2/14):  mild LVH, EF 30-35%, diff HK, Gr 1 DD, MAC, mild MR, mod LAE, PASP 39  . Cataract   . Hypotension   . Paroxysmal atrial fibrillation   . Peripheral vascular disease     a. s/p L BKA 2014.  [name] History of blood clots     in legs--this was in 2011--has been off of Coumadin 36month;takes Xarelto daily  . Joint pain   . H/O hiatal hernia   . Chronic kidney disease     on Lisinopril to protect kidneys d/t being on Metformin per pt  . Myocardial infarction     total of 8 heart attacks;last one in 2011  . Atrial flutter   . H/O medication noncompliance     Due to insurance issues  . Chronic systolic CHF (congestive heart failure)   . Coronary artery disease     a. s/p remote CABG 2001 at [facility] b. Cath 03/2010: for med rx.;  c.  [facility] Adammyoview (1/14):  Large Inferior apical MI, very small area of peri-infarct ischemia toward the inferior apical segment. EF 19%.  Med Rx was continued.    . Diabetes mellitus 1979    takes Metformni daily  and Lantus 50units bid  . DVT (deep venous thrombosis) 2011    legs  . Peripheral neuropathy    . Shortness of breath dyspnea     Past Surgical History  Procedure Laterality Date  . Revasculariztion  2011/2010    x 2   . Tonsillectomy    . Cardiac catheterization  03/19/10  . Abdominal aortagram  04-30-12  . Adenoidectomy    . Amputation Left 06/03/2012    Procedure: AMPUTATION BELOW KNEE;  Surgeon: BConrad Durant MD;  [facility]  Service: Vascular;  Laterality: Left;  . Coronary angioplasty  2002    3 stents  . Endarterectomy femoral Right 03/25/2013    Procedure: ENDARTERECTOMY FEMORAL ;  Surgeon: BConrad Redondo Beach MD;  [facility]  Service: Vascular;  Laterality: Right;  . Patch angioplasty Right 03/25/2013    Procedure: PATCH ANGIOPLASTY USING VASCU-GUARD PERIPHERAL VASCULAR PATCH;  Surgeon: BConrad Crossville MD;  [facility]  Service: Vascular;  Laterality: Right;  . Amputation Right 04/14/2013    Procedure: AMPUTATION BELOW KNEE ;  Surgeon: BConrad Farmington MD;  [facility]  Service: Vascular;  Laterality: Right;  . I&d extremity Right 04/14/2013    Procedure: IRRIGATION AND DEBRIDEMENT OF RIGHT  GROIN & PLACEMENT OF VAC DRESSING;  Surgeon: BConrad Hatton MD;  [facility];  Service: Vascular;  Laterality: Right;  . Abdominal aortagram N/A 04/30/2012    Procedure: ABDOMINAL AORTAGRAM;  Surgeon: Conrad Lonsdale, MD;  [facility]: [facility] CATH LAB;  Service: Cardiovascular;  Laterality: N/A;  . Lower extremity angiogram Bilateral 04/30/2012    Procedure: LOWER EXTREMITY ANGIOGRAM;  Surgeon: Conrad Grantsboro, MD;  Location[facility] CATH LAB;  Service: Cardiovascular;  Laterality: Bilateral;  bilat lower extrem angio  . Eye surgery Right     Catarct  . Coronary artery bypass graft  03/2000    quadruple  . Hernia repair      Hiatal Hernia  . Wound debridement Right 04/20/2014    s/p  rt bka  with application of wound vac  . I&d extremity Right 04/20/2014    Procedure: DEBRIDEMENT OF RIGHT BELOW KNEE AMPUTATION STUMP;  Surgeon: Conrad Framingham, MD;  Location: [facility];  Service: Vascular;   Laterality: Right;  . Application of wound vac Right 04/20/2014    Procedure: APPLICATION OF WOUND VAC;  Surgeon: Conrad Bridger, MD;  Location: [facility];  Service: Vascular;  Laterality: Right;    There were no vitals filed for this visit.  Visit Diagnosis:  Status post bilateral below knee amputation  Abnormality of gait  Unsteadiness  Weakness generalized  Balance problems  Encounter for prosthetic gait training      Subjective Assessment - 12/22/14 1412    Subjective No new issues. Wearing prostheses all awake hours without issues. Using anti-perspirant to control moisture.   Currently in Pain? No/denies      Physical Therapy Progress Note  Dates of Reporting Period: 11/14/2014 to 12/22/2014  Objective Reports of Subjective Statement: Patient reports wearing bilateral prostheses all awake hours without issues. No skin issues  Objective Measurements: See below  Goal Update: See below  Plan: See below  Reason Skilled Services are Required: Patient requires skilled PT to increase ability to safely use prostheses at community level.                    [facility] Adult PT Treatment/Exercise - 12/22/14 1320    Transfers   Sit to Stand 5: Supervision;With upper extremity assist;With armrests;From chair/3-in-1   Sit to Stand Details Verbal cues for sequencing;Verbal cues for technique   Stand to Sit 5: Supervision;With upper extremity assist;With armrests;To chair/3-in-1   Stand to Sit Details (indicate cue type and reason) Verbal cues for technique;Verbal cues for precautions/safety   Floor to Transfer --   Ambulation/Gait   Ambulation/Gait Yes   Ambulation/Gait Assistance 5: Supervision   Ambulation/Gait Assistance Details cues on posture, RW position Tresa Garter for safety,    Ambulation Distance (Feet) 220 Feet   Assistive device Rollator;Prostheses   Gait Pattern Step-through pattern;Decreased step length - left;Decreased stance time - right;Decreased stride  length;Decreased hip/knee flexion - right;Decreased weight shift to right;Right flexed knee in stance;Trunk flexed;Wide base of support;Poor foot clearance - right   Ambulation Surface Indoor;Level   Ramp 5: Supervision  rollator walker & prostheses   Ramp Details (indicate cue type and reason) cues on safety and technique   Curb 5: Supervision  rollator walker & prostheses   Curb Details (indicate cue type and reason) cues on technique / safety   High Level Balance   High Level Balance Activities Side stepping;Backward walking   High Level Balance Comments x4 laps each in parallel bars   Neuro Re-ed    Neuro Re-ed Details  facilitating step strategy posteriorly, laterally & anteriorly  with pt meeting resistance which is suddenly released - verbal, visual & tactile cues to facilitate stepping for protection  Patient had difficulty stepping posteriorly with left LE   Prosthetics   Prosthetic Care Comments  Saw prosthetist who adjusted prostheses, feel better   Current prosthetic wear tolerance (days/week)  daily   Current prosthetic wear tolerance (#hours/day)  most of awake hours drying as needed   Residual limb condition  no open areas   Education Provided Residual limb care;Correct ply sock adjustment   Person(s) Educated Patient   Education Method Explanation   Education Method Verbalized understanding   Donning Prosthesis Modified independent (device/increased time)   Doffing Prosthesis Modified independent (device/increased time)                PT Education - 12/22/14 1315    Education provided Yes   Education Details standing hip flexor streches   Person(s) Educated Patient   Methods Explanation;Demonstration;Verbal cues   Comprehension Verbalized understanding;Returned demonstration;Verbal cues required          PT Short Term Goals - 12/15/14 1400    PT SHORT TERM GOAL #1   Title Patient tolerates wear of bilateral prostheses >10 hours total per day without  skin issues or tenderness. (Target Date: 12/14/2014)   Baseline MET 12/15/2014 Patient reports wear >10hrs with no issues.   Status Achieved   PT SHORT TERM GOAL #2   Title patient verbalizes signs of sweating in prostheses and proper drying to prevent skin issues. (Target Date: 12/14/2014)   Baseline MET 12/15/2014   Status Achieved   PT SHORT TERM GOAL #3   Title Patient ambulates 250' with rollator walker & prostheses with supervision. (Target Date: 12/14/2014)   Baseline 9/6: pt supervision until fatigued, not able to go 250 feet, needs rest break before. Max distance to date is 220 feet.   Status Partially Met   PT SHORT TERM GOAL #4   Title Patient negotiates ramps & curbs with rollator walker & prostheses with supervision. (Target Date: 12/14/2014)   Baseline MET 12/15/2014   Status Achieved           PT Long Term Goals - 11/14/14 1100    PT LONG TERM GOAL #1   Title Patient tolerates wear of bilateral TTA prostheses >90% of awake hours without skin issues or tenderness to enable function throughout his day. (Target Date: 01/13/2015)   Time 2   Period Months   Status New   PT LONG TERM GOAL #2   Title patient verbalizes proper prosthetic care including adjusting ply socks to enable safe prosthetic use.  (Target Date: 01/13/2015)   Time 2   Period Months   Status New   PT LONG TERM GOAL #3   Title Berg Balance >30/56  (Target Date: 01/13/2015)   Time 2   Period Months   Status New   PT LONG TERM GOAL #4   Title Patient ambulates >300' with LRAD & prostheses modified independent to enable community access.  (Target Date: 01/13/2015)   Time 2   Period Months   Status New   PT LONG TERM GOAL #5   Title Patient negotiates ramps, curbs & stairs (1 rail) with LRAD & prostheses modified independent for community access.  (Target Date: 01/13/2015)   Time 2   Period Months   Status New               Plan - 12/22/14 1315    Clinical Impression Statement Patient is tolerating  wear  most of awake hours without issues. Patient improved ability to use step strategy to catch his balance but needs more work especially posteriorly to left. Patient is on target to meet LTGs by target date.   Pt will benefit from skilled therapeutic intervention in order to improve on the following deficits Abnormal gait;Decreased activity tolerance;Decreased balance;Decreased knowledge of precautions;Decreased knowledge of use of DME;Decreased strength;Postural dysfunction;Prosthetic Dependency   Rehab Potential Good   PT Frequency 2x / week   PT Duration Other (comment)  9 weeks (60 days)   PT Treatment/Interventions ADLs/Self Care Home Management;DME Instruction;Gait training;Stair training;Functional mobility training;Therapeutic activities;Therapeutic exercise;Balance training;Neuromuscular re-education;Patient/family education;Prosthetic Training   PT Next Visit Plan Do new G-Code for mobility, gait with rollator for commuinty including ramps and curbs. Household with cane.   Consulted and Agree with Plan of Care Patient          G-Codes - 14-Jan-2015 1315    Functional Assessment Tool Used tolerates wear most of awake hours (>90%) without issues.    Functional Limitation Self care   Self Care Goal Status [phone]) At least 1 percent but less than 20 percent impaired, limited or restricted   Self Care Discharge Status [phone]) At least 1 percent but less than 20 percent impaired, limited or restricted      Problem List Patient Active Problem List   Diagnosis Date Noted  . Microalbuminuria 09/29/2014  . 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 Phantom limb pain 08/16/2013  . Pain in limb- Right stump 07/06/2013  . Atherosclerosis of native arteries of the extremities with ulceration 06/25/2013  . S/P bilateral BKA (below knee amputation) 04/21/2013  . Chronic systolic heart failure 79/15/0569  . 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 S/P BKA (below knee  amputation) bilateral 09/10/2012  . SVT (supraventricular tachycardia) 09/02/2012  . Hyperlipidemia with target LDL less than 70 01/02/2012  . Type II diabetes mellitus with manifestations, uncontrolled 05/03/2010  . Coronary atherosclerosis 04/13/2010  . ATRIAL FIBRILLATION, PAROXYSMAL 04/13/2010    Jamey Reas PT, DPT 12/23/2014, 1:51 PM  [facility] [address] [place], [place], [address] Phone: [phone]   Fax:  [phone]

## 2014-12-26 ENCOUNTER — Ambulatory Visit: Payer: Commercial Managed Care - HMO | Admitting: Physical Therapy

## 2014-12-26 ENCOUNTER — Encounter: Payer: Self-pay | Admitting: Physical Therapy

## 2014-12-26 DIAGNOSIS — R531 Weakness: Secondary | ICD-10-CM | POA: Diagnosis not present

## 2014-12-26 DIAGNOSIS — R2681 Unsteadiness on feet: Secondary | ICD-10-CM

## 2014-12-26 DIAGNOSIS — R2689 Other abnormalities of gait and mobility: Secondary | ICD-10-CM

## 2014-12-26 DIAGNOSIS — Z5189 Encounter for other specified aftercare: Secondary | ICD-10-CM | POA: Diagnosis not present

## 2014-12-26 DIAGNOSIS — Z89512 Acquired absence of left leg below knee: Principal | ICD-10-CM

## 2014-12-26 DIAGNOSIS — R29818 Other symptoms and signs involving the nervous system: Secondary | ICD-10-CM | POA: Diagnosis not present

## 2014-12-26 DIAGNOSIS — Z89511 Acquired absence of right leg below knee: Secondary | ICD-10-CM

## 2014-12-26 DIAGNOSIS — R269 Unspecified abnormalities of gait and mobility: Secondary | ICD-10-CM | POA: Diagnosis not present

## 2014-12-26 NOTE — Therapy (Signed)
[facility] [address] [place], [place], [address] Phone: [phone]   Fax:  [phone]  Physical Therapy Treatment  Patient Details  Name: Leroy [name] MRN: [id number] Date of Birth: November 19, 1945 Referring Provider:  Janith Lima, MD  Encounter Date: 12/26/2014      PT End of Session - 12/26/14 1408    Visit Number 11   Number of Visits 18   Date for PT Re-Evaluation 01/13/15   Authorization Type G-code every 10th visit   PT Start Time 1404   PT Stop Time 1445   PT Time Calculation (min) 41 min   Equipment Utilized During Treatment Gait belt   Activity Tolerance Patient tolerated treatment well   Behavior During Therapy [facility] for tasks assessed/performed      Past Medical History  Diagnosis Date  . Hyperlipidemia   . Ischemic cardiomyopathy     a. EF 40% 2010. b. Echo (2/14):  mild LVH, EF 30-35%, diff HK, Gr 1 DD, MAC, mild MR, mod LAE, PASP 39  . Cataract   . Hypotension   . Paroxysmal atrial fibrillation   . Peripheral vascular disease     a. s/p L BKA 2014.  [name] History of blood clots     in legs--this was in 2011--has been off of Coumadin 35month;takes Xarelto daily  . Joint pain   . H/O hiatal hernia   . Chronic kidney disease     on Lisinopril to protect kidneys d/t being on Metformin per pt  . Myocardial infarction     total of 8 heart attacks;last one in 2011  . Atrial flutter   . H/O medication noncompliance     Due to insurance issues  . Chronic systolic CHF (congestive heart failure)   . Coronary artery disease     a. s/p remote CABG 2001 at [facility] b. Cath 03/2010: for med rx.;  c.  [facility] Adam[facility] (1/14):  Large Inferior apical MI, very small area of peri-infarct ischemia toward the inferior apical segment. EF 19%.  Med Rx was continued.    . Diabetes mellitus 1979    takes Metformni daily  and Lantus 50units bid  . DVT (deep venous thrombosis) 2011    legs  . Peripheral neuropathy    . Shortness of breath dyspnea     Past Surgical History  Procedure Laterality Date  . Revasculariztion  2011/2010    x 2   . Tonsillectomy    . Cardiac catheterization  03/19/10  . Abdominal aortagram  04-30-12  . Adenoidectomy    . Amputation Left 06/03/2012    Procedure: AMPUTATION BELOW KNEE;  Surgeon: BConrad Chignik MD;  [facility]  Service: Vascular;  Laterality: Left;  . Coronary angioplasty  2002    3 stents  . Endarterectomy femoral Right 03/25/2013    Procedure: ENDARTERECTOMY FEMORAL ;  Surgeon: BConrad Wellman MD;  [facility]  Service: Vascular;  Laterality: Right;  . Patch angioplasty Right 03/25/2013    Procedure: PATCH ANGIOPLASTY USING VASCU-GUARD PERIPHERAL VASCULAR PATCH;  Surgeon: BConrad Muniz MD;  [facility]  Service: Vascular;  Laterality: Right;  . Amputation Right 04/14/2013    Procedure: AMPUTATION BELOW KNEE ;  Surgeon: BConrad [place] Village MD;  [facility]  Service: Vascular;  Laterality: Right;  . I&d extremity Right 04/14/2013    Procedure: IRRIGATION AND DEBRIDEMENT OF RIGHT  GROIN & PLACEMENT OF VAC DRESSING;  Surgeon: BConrad  MD;  [facility];  Service: Vascular;  Laterality: Right;  . Abdominal aortagram N/A 04/30/2012    Procedure: ABDOMINAL AORTAGRAM;  Surgeon: Conrad Sunburg, MD;  [facility] LAB;  Service: Cardiovascular;  Laterality: N/A;  . Lower extremity angiogram Bilateral 04/30/2012    Procedure: LOWER EXTREMITY ANGIOGRAM;  Surgeon: Conrad Colton, MD;  [facility] CATH LAB;  Service: Cardiovascular;  Laterality: Bilateral;  bilat lower extrem angio  . Eye surgery Right     Catarct  . Coronary artery bypass graft  03/2000    quadruple  . Hernia repair      Hiatal Hernia  . Wound debridement Right 04/20/2014    s/p  rt bka  with application of wound vac  . I&d extremity Right 04/20/2014    Procedure: DEBRIDEMENT OF RIGHT BELOW KNEE AMPUTATION STUMP;  Surgeon: Conrad Tatums, MD;  Location: [facility];  Service: Vascular;   Laterality: Right;  . Application of wound vac Right 04/20/2014    Procedure: APPLICATION OF WOUND VAC;  Surgeon: Conrad Lake Poc[place], MD;  Location: [facility];  Service: Vascular;  Laterality: Right;    There were no vitals filed for this visit.  Visit Diagnosis:  Status post bilateral below knee amputation  Abnormality of gait  Unsteadiness  Weakness generalized  Balance problems      Subjective Assessment - 12/26/14 1406    Subjective No new complaints. No falls. Having some phantom pain in right limb (foot that's not there).    Currently in Pain? Yes   Pain Score 4    Pain Location Leg   Pain Orientation Right   Pain Descriptors / Indicators Shooting;Tender;Aching   Pain Type Phantom pain   Pain Onset More than a month ago   Pain Frequency Intermittent   Aggravating Factors  increased prosthetic wear and activity   Pain Relieving Factors removing prosthesis, tapping on limb/rubbing leg           [facility] Adult PT Treatment/Exercise - 12/26/14 1409    Ambulation/Gait   Ambulation/Gait Yes   Ambulation/Gait Assistance 4: Min guard  to min assist with last 20 feet    Ambulation/Gait Assistance Details cues on posture, rollator safety/proximity with gait, and on step length/stride length with gait. Pt short of breath toward end, dyspnea 3/4 at end. Improved with seated rest break.                                   Ambulation Distance (Feet) 220 Feet   Assistive device Rollator;Prostheses   Gait Pattern Step-through pattern;Decreased step length - left;Decreased stance time - right;Decreased stride length;Decreased hip/knee flexion - right;Decreased weight shift to right;Right flexed knee in stance;Trunk flexed;Wide base of support;Poor foot clearance - right   Ambulation Surface Level;Indoor   High Level Balance   High Level Balance Activities Marching forwards;Marching backwards;Side stepping;Negotiating over obstacles   High Level Balance Comments x3 laps each in parallel bars with  marching and side stepping; stepping over black bolsters of vaired heights x 2 laps forward, cues on posture, weight shifting and decreased UE reliance with activity.                              Neuro Re-ed    Neuro Re-ed Details  in parallel bars: alternating stepping over black bolster forward and back, emphasis on weight shift and posture  Prosthetics   Current prosthetic wear tolerance (days/week)  daily   Current prosthetic wear tolerance (#hours/day)  most of awake hours drying as needed   Residual limb condition  no open areas   Education Provided Proper weight-bearing schedule/adjustment;Residual limb care   Person(s) Educated Patient   Education Method Explanation   Education Method Verbalized understanding   Donning Prosthesis Modified independent (device/increased time)   Doffing Prosthesis Modified independent (device/increased time)           PT Short Term Goals - 12/15/14 1400    PT SHORT TERM GOAL #1   Title Patient tolerates wear of bilateral prostheses >10 hours total per day without skin issues or tenderness. (Target Date: 12/14/2014)   Baseline MET 12/15/2014 Patient reports wear >10hrs with no issues.   Status Achieved   PT SHORT TERM GOAL #2   Title patient verbalizes signs of sweating in prostheses and proper drying to prevent skin issues. (Target Date: 12/14/2014)   Baseline MET 12/15/2014   Status Achieved   PT SHORT TERM GOAL #3   Title Patient ambulates 250' with rollator walker & prostheses with supervision. (Target Date: 12/14/2014)   Baseline 9/6: pt supervision until fatigued, not able to go 250 feet, needs rest break before. Max distance to date is 220 feet.   Status Partially Met   PT SHORT TERM GOAL #4   Title Patient negotiates ramps & curbs with rollator walker & prostheses with supervision. (Target Date: 12/14/2014)   Baseline MET 12/15/2014   Status Achieved           PT Long Term Goals - 11/14/14 1100    PT LONG TERM GOAL #1   Title Patient  tolerates wear of bilateral TTA prostheses >90% of awake hours without skin issues or tenderness to enable function throughout his day. (Target Date: 01/13/2015)   Time 2   Period Months   Status New   PT LONG TERM GOAL #2   Title patient verbalizes proper prosthetic care including adjusting ply socks to enable safe prosthetic use.  (Target Date: 01/13/2015)   Time 2   Period Months   Status New   PT LONG TERM GOAL #3   Title Berg Balance >30/56  (Target Date: 01/13/2015)   Time 2   Period Months   Status New   PT LONG TERM GOAL #4   Title Patient ambulates >300' with LRAD & prostheses modified independent to enable community access.  (Target Date: 01/13/2015)   Time 2   Period Months   Status New   PT LONG TERM GOAL #5   Title Patient negotiates ramps, curbs & stairs (1 rail) with LRAD & prostheses modified independent for community access.  (Target Date: 01/13/2015)   Time 2   Period Months   Status New           Plan - 12/26/14 1408    Clinical Impression Statement Pt quick to get short of breath today with gait and balance activities. HR 120 and SaO2 94% on RA when checked after gait, marching and side stepping. Pt recovered wtih seated rest breaks. Making steady progress toward goals.   Pt will benefit from skilled therapeutic intervention in order to improve on the following deficits Abnormal gait;Decreased activity tolerance;Decreased balance;Decreased knowledge of precautions;Decreased knowledge of use of DME;Decreased strength;Postural dysfunction;Prosthetic Dependency   Rehab Potential Good   PT Frequency 2x / week   PT Duration Other (comment)  9 weeks (60 days)   PT Treatment/Interventions ADLs/Self Care Home  Management;DME Instruction;Gait training;Stair training;Functional mobility training;Therapeutic activities;Therapeutic exercise;Balance training;Neuromuscular re-education;Patient/family education;Prosthetic Training   PT Next Visit Plan Do new G-Code for mobility,  gait with rollator for commuinty including ramps and curbs. Household with cane.   Consulted and Agree with Plan of Care Patient        Problem List Patient Active Problem List   Diagnosis Date Noted  . Microalbuminuria 09/29/2014  . 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 Phantom limb pain 08/16/2013  . Pain in limb- Right stump 07/06/2013  . Atherosclerosis of native arteries of the extremities with ulceration 06/25/2013  . S/P bilateral BKA (below knee amputation) 04/21/2013  . Chronic systolic heart failure 25/08/3974  . 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 S/P BKA (below knee amputation) bilateral 09/10/2012  . SVT (supraventricular tachycardia) 09/02/2012  . Hyperlipidemia with target LDL less than 70 01/02/2012  . Type II diabetes mellitus with manifestations, uncontrolled 05/03/2010  . Coronary atherosclerosis 04/13/2010  . ATRIAL FIBRILLATION, PAROXYSMAL 04/13/2010    Willow Ora 12/26/2014, 2:56 PM  Willow Ora, PTA, [facility] [address] [place], [place] [address] [phone] 12/26/2014, 2:56 PM

## 2014-12-29 ENCOUNTER — Encounter: Payer: Self-pay | Admitting: Physical Therapy

## 2014-12-29 ENCOUNTER — Ambulatory Visit: Payer: Commercial Managed Care - HMO | Admitting: Physical Therapy

## 2014-12-29 DIAGNOSIS — R2681 Unsteadiness on feet: Secondary | ICD-10-CM

## 2014-12-29 DIAGNOSIS — Z89511 Acquired absence of right leg below knee: Secondary | ICD-10-CM

## 2014-12-29 DIAGNOSIS — Z89512 Acquired absence of left leg below knee: Principal | ICD-10-CM

## 2014-12-29 DIAGNOSIS — R531 Weakness: Secondary | ICD-10-CM | POA: Diagnosis not present

## 2014-12-29 DIAGNOSIS — R269 Unspecified abnormalities of gait and mobility: Secondary | ICD-10-CM

## 2014-12-29 DIAGNOSIS — Z4789 Encounter for other orthopedic aftercare: Secondary | ICD-10-CM

## 2014-12-29 DIAGNOSIS — R29818 Other symptoms and signs involving the nervous system: Secondary | ICD-10-CM | POA: Diagnosis not present

## 2014-12-29 DIAGNOSIS — Z5189 Encounter for other specified aftercare: Secondary | ICD-10-CM | POA: Diagnosis not present

## 2014-12-30 NOTE — Therapy (Signed)
[facility] [address] [place], [place], [address] Phone: [phone]   Fax:  [phone]  Physical Therapy Treatment  Patient Details  Name: Leroy [name] MRN: [id number] Date of Birth: 1945-06-26 Referring Kathren Scearce:  Janith Lima, MD  Encounter Date: 12/29/2014      PT End of Session - 12/29/14 1400    Visit Number 12   Number of Visits 18   Date for PT Re-Evaluation 01/13/15   Authorization Type G-code every 10th visit   PT Start Time 1405   PT Stop Time 1429   PT Time Calculation (min) 24 min   Equipment Utilized During Treatment Gait belt   Activity Tolerance Patient tolerated treatment well   Behavior During Therapy [facility] for tasks assessed/performed      Past Medical History  Diagnosis Date  . Hyperlipidemia   . Ischemic cardiomyopathy     a. EF 40% 2010. b. Echo (2/14):  mild LVH, EF 30-35%, diff HK, Gr 1 DD, MAC, mild MR, mod LAE, PASP 39  . Cataract   . Hypotension   . Paroxysmal atrial fibrillation   . Peripheral vascular disease     a. s/p L BKA 2014.  [name] History of blood clots     in legs--this was in 2011--has been off of Coumadin 30month;takes Xarelto daily  . Joint pain   . H/O hiatal hernia   . Chronic kidney disease     on Lisinopril to protect kidneys d/t being on Metformin per pt  . Myocardial infarction     total of 8 heart attacks;last one in 2011  . Atrial flutter   . H/O medication noncompliance     Due to insurance issues  . Chronic systolic CHF (congestive heart failure)   . Coronary artery disease     a. s/p remote CABG 2001 at [facility] b. Cath 03/2010: for med rx.;  c.  [facility] Adammyoview (1/14):  Large Inferior apical MI, very small area of peri-infarct ischemia toward the inferior apical segment. EF 19%.  Med Rx was continued.    . Diabetes mellitus 1979    takes Metformni daily  and Lantus 50units bid  . DVT (deep venous thrombosis) 2011    legs  . Peripheral neuropathy    . Shortness of breath dyspnea     Past Surgical History  Procedure Laterality Date  . Revasculariztion  2011/2010    x 2   . Tonsillectomy    . Cardiac catheterization  03/19/10  . Abdominal aortagram  04-30-12  . Adenoidectomy    . Amputation Left 06/03/2012    Procedure: AMPUTATION BELOW KNEE;  Surgeon: BConrad Tiffin MD;  [facility]  Service: Vascular;  Laterality: Left;  . Coronary angioplasty  2002    3 stents  . Endarterectomy femoral Right 03/25/2013    Procedure: ENDARTERECTOMY FEMORAL ;  Surgeon: BConrad Chatfield MD;  [facility]  Service: Vascular;  Laterality: Right;  . Patch angioplasty Right 03/25/2013    Procedure: PATCH ANGIOPLASTY USING VASCU-GUARD PERIPHERAL VASCULAR PATCH;  Surgeon: BConrad [place] Barreras MD;  [facility]  Service: Vascular;  Laterality: Right;  . Amputation Right 04/14/2013    Procedure: AMPUTATION BELOW KNEE ;  Surgeon: BConrad Plains MD;  [facility]  Service: Vascular;  Laterality: Right;  . I&d extremity Right 04/14/2013    Procedure: IRRIGATION AND DEBRIDEMENT OF RIGHT  GROIN & PLACEMENT OF VAC DRESSING;  Surgeon: BConrad Douglasville MD;  [facility];  Service: Vascular;  Laterality: Right;  . Abdominal aortagram N/A 04/30/2012    Procedure: ABDOMINAL AORTAGRAM;  Surgeon: Conrad Parkway, MD;  Location: [facility] CATH LAB;  Service: Cardiovascular;  Laterality: N/A;  . Lower extremity angiogram Bilateral 04/30/2012    Procedure: LOWER EXTREMITY ANGIOGRAM;  Surgeon: Conrad Palisade, MD;  Location: [facility] CATH LAB;  Service: Cardiovascular;  Laterality: Bilateral;  bilat lower extrem angio  . Eye surgery Right     Catarct  . Coronary artery bypass graft  03/2000    quadruple  . Hernia repair      Hiatal Hernia  . Wound debridement Right 04/20/2014    s/p  rt bka  with application of wound vac  . I&d extremity Right 04/20/2014    Procedure: DEBRIDEMENT OF RIGHT BELOW KNEE AMPUTATION STUMP;  Surgeon: Conrad Gretna, MD;  Location: [facility];  Service: Vascular;   Laterality: Right;  . Application of wound vac Right 04/20/2014    Procedure: APPLICATION OF WOUND VAC;  Surgeon: Conrad Palo Cedro, MD;  Location: [facility];  Service: Vascular;  Laterality: Right;    There were no vitals filed for this visit.  Visit Diagnosis:  Status post bilateral below knee amputation  Abnormality of gait  Unsteadiness  Encounter for prosthetic gait training      Subjective Assessment - 12/29/14 1407    Subjective No falls. His left leg started hurting after the prosthetist made changes.    Currently in Pain? Yes   Pain Score 8    Pain Location Knee   Pain Orientation Left;Posterior   Pain Descriptors / Indicators Jabbing   Pain Type Acute pain   Pain Onset 1 to 4 weeks ago   Pain Frequency Intermittent   Aggravating Factors  wearing prosthesis especially with knee bent   Pain Relieving Factors prosthesis off & no pressure on area   Multiple Pain Sites No     Prosthetic Training: PT assessed posterior knee area. He had tenderness to touch with edema in popliteal area consistent with posterior brim of prosthetic socket. When patient flexes his left knee, the pressure from prosthesis increased with greater soft tissue displacement.  Patient ambulated 100' X 2 with rollator walker & prostheses with SBA / cues on posture. Patient's right prosthesis has lateral pylon lean with varus knee movement.  PT called prosthetist with right prosthesis pain and left prosthesis lean / knee pain. Appointment today at 4:30 to address issues.                              PT Short Term Goals - 12/15/14 1400    PT SHORT TERM GOAL #1   Title Patient tolerates wear of bilateral prostheses >10 hours total per day without skin issues or tenderness. (Target Date: 12/14/2014)   Baseline MET 12/15/2014 Patient reports wear >10hrs with no issues.   Status Achieved   PT SHORT TERM GOAL #2   Title patient verbalizes signs of sweating in prostheses and proper drying to  prevent skin issues. (Target Date: 12/14/2014)   Baseline MET 12/15/2014   Status Achieved   PT SHORT TERM GOAL #3   Title Patient ambulates 250' with rollator walker & prostheses with supervision. (Target Date: 12/14/2014)   Baseline 9/6: pt supervision until fatigued, not able to go 250 feet, needs rest break before. Max distance to date is 220 feet.   Status Partially Met   PT SHORT  TERM GOAL #4   Title Patient negotiates ramps & curbs with rollator walker & prostheses with supervision. (Target Date: 12/14/2014)   Baseline MET 12/15/2014   Status Achieved           PT Long Term Goals - 11/14/14 1100    PT LONG TERM GOAL #1   Title Patient tolerates wear of bilateral TTA prostheses >90% of awake hours without skin issues or tenderness to enable function throughout his day. (Target Date: 01/13/2015)   Time 2   Period Months   Status New   PT LONG TERM GOAL #2   Title patient verbalizes proper prosthetic care including adjusting ply socks to enable safe prosthetic use.  (Target Date: 01/13/2015)   Time 2   Period Months   Status New   PT LONG TERM GOAL #3   Title Berg Balance >30/56  (Target Date: 01/13/2015)   Time 2   Period Months   Status New   PT LONG TERM GOAL #4   Title Patient ambulates >300' with LRAD & prostheses modified independent to enable community access.  (Target Date: 01/13/2015)   Time 2   Period Months   Status New   PT LONG TERM GOAL #5   Title Patient negotiates ramps, curbs & stairs (1 rail) with LRAD & prostheses modified independent for community access.  (Target Date: 01/13/2015)   Time 2   Period Months   Status New               Plan - 12/29/14 1400    Clinical Impression Statement Left prosthesis (older amputation) appears to have a contusion in popliteal area with edema that is irritated by knee flexion. His right prosthesis appears to have a lateral lean with patient shifting weight onto proshesis in stance better. However this causes abnormal  knee pressure on his osteoarthritic knee. PT set up appt with prosthetist immediately following PT.   Pt will benefit from skilled therapeutic intervention in order to improve on the following deficits Abnormal gait;Decreased activity tolerance;Decreased balance;Decreased knowledge of precautions;Decreased knowledge of use of DME;Decreased strength;Postural dysfunction;Prosthetic Dependency   Rehab Potential Good   PT Frequency 2x / week   PT Duration Other (comment)  9 weeks (60 days)   PT Treatment/Interventions ADLs/Self Care Home Management;DME Instruction;Gait training;Stair training;Functional mobility training;Therapeutic activities;Therapeutic exercise;Balance training;Neuromuscular re-education;Patient/family education;Prosthetic Training   PT Next Visit Plan gait with rollator for commuinty including ramps and curbs. Household with cane.   Consulted and Agree with Plan of Care Patient        Problem List Patient Active Problem List   Diagnosis Date Noted  . Microalbuminuria 09/29/2014  . 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 Phantom limb pain 08/16/2013  . Pain in limb- Right stump 07/06/2013  . Atherosclerosis of native arteries of the extremities with ulceration 06/25/2013  . S/P bilateral BKA (below knee amputation) 04/21/2013  . Chronic systolic heart failure 46/65/9935  . 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 S/P BKA (below knee amputation) bilateral 09/10/2012  . SVT (supraventricular tachycardia) 09/02/2012  . Hyperlipidemia with target LDL less than 70 01/02/2012  . Type II diabetes mellitus with manifestations, uncontrolled 05/03/2010  . Coronary atherosclerosis 04/13/2010  . ATRIAL FIBRILLATION, PAROXYSMAL 04/13/2010    Jamey Reas PT, DPT 12/30/2014, 11:29 AM  [facility] [address] [place], [place], [address] Phone: [phone]   Fax:  [phone]

## 2015-01-02 ENCOUNTER — Ambulatory Visit: Payer: Commercial Managed Care - HMO | Admitting: Physical Therapy

## 2015-01-02 ENCOUNTER — Encounter: Payer: Self-pay | Admitting: Physical Therapy

## 2015-01-02 DIAGNOSIS — Z4789 Encounter for other orthopedic aftercare: Secondary | ICD-10-CM

## 2015-01-02 DIAGNOSIS — R2689 Other abnormalities of gait and mobility: Secondary | ICD-10-CM

## 2015-01-02 DIAGNOSIS — Z89511 Acquired absence of right leg below knee: Secondary | ICD-10-CM | POA: Diagnosis not present

## 2015-01-02 DIAGNOSIS — Z89512 Acquired absence of left leg below knee: Secondary | ICD-10-CM | POA: Diagnosis not present

## 2015-01-02 DIAGNOSIS — R29818 Other symptoms and signs involving the nervous system: Secondary | ICD-10-CM | POA: Diagnosis not present

## 2015-01-02 DIAGNOSIS — R269 Unspecified abnormalities of gait and mobility: Secondary | ICD-10-CM

## 2015-01-02 DIAGNOSIS — Z5189 Encounter for other specified aftercare: Secondary | ICD-10-CM | POA: Diagnosis not present

## 2015-01-02 DIAGNOSIS — R2681 Unsteadiness on feet: Secondary | ICD-10-CM

## 2015-01-02 DIAGNOSIS — R531 Weakness: Secondary | ICD-10-CM

## 2015-01-02 NOTE — Therapy (Signed)
[facility] [address] [place], [place], [address] Phone: [phone]   Fax:  [phone]  Physical Therapy Treatment  Patient Details  Name: Leroy [name] MRN: [id number] Date of Birth: July 15, 1945 Referring Provider:  Janith Lima, MD  Encounter Date: 01/02/2015      PT End of Session - 01/02/15 1445    Visit Number 13   Number of Visits 18   Date for PT Re-Evaluation 01/13/15   Authorization Type G-code every 10th visit   PT Start Time 1402   PT Stop Time 1440   PT Time Calculation (min) 38 min   Equipment Utilized During Treatment Gait belt   Activity Tolerance Patient tolerated treatment well   Behavior During Therapy [facility] for tasks assessed/performed      Past Medical History  Diagnosis Date  . Hyperlipidemia   . Ischemic cardiomyopathy     a. EF 40% 2010. b. Echo (2/14):  mild LVH, EF 30-35%, diff HK, Gr 1 DD, MAC, mild MR, mod LAE, PASP 39  . Cataract   . Hypotension   . Paroxysmal atrial fibrillation   . Peripheral vascular disease     a. s/p L BKA 2014.  [name] History of blood clots     in legs--this was in 2011--has been off of Coumadin 31month;takes Xarelto daily  . Joint pain   . H/O hiatal hernia   . Chronic kidney disease     on Lisinopril to protect kidneys d/t being on Metformin per pt  . Myocardial infarction     total of 8 heart attacks;last one in 2011  . Atrial flutter   . H/O medication noncompliance     Due to insurance issues  . Chronic systolic CHF (congestive heart failure)   . Coronary artery disease     a. s/p remote CABG 2001 at [facility] b. Cath 03/2010: for med rx.;  c.  [facility] (1/14):  Large Inferior apical MI, very small area of peri-infarct ischemia toward the inferior apical segment. EF 19%.  Med Rx was continued.    . Diabetes mellitus 1979    takes Metformni daily  and Lantus 50units bid  . DVT (deep venous thrombosis) 2011    legs  . Peripheral neuropathy    . Shortness of breath dyspnea     Past Surgical History  Procedure Laterality Date  . Revasculariztion  2011/2010    x 2   . Tonsillectomy    . Cardiac catheterization  03/19/10  . Abdominal aortagram  04-30-12  . Adenoidectomy    . Amputation Left 06/03/2012    Procedure: AMPUTATION BELOW KNEE;  Surgeon: BConrad [place] MD;  [facility]  Service: Vascular;  Laterality: Left;  . Coronary angioplasty  2002    3 stents  . Endarterectomy femoral Right 03/25/2013    Procedure: ENDARTERECTOMY FEMORAL ;  Surgeon: BConrad [place] MD;  [facility] [facility]  Service: Vascular;  Laterality: Right;  . Patch angioplasty Right 03/25/2013    Procedure: PATCH ANGIOPLASTY USING VASCU-GUARD PERIPHERAL VASCULAR PATCH;  Surgeon: BConrad Stella MD;  [facility]  Service: Vascular;  Laterality: Right;  . Amputation Right 04/14/2013    Procedure: AMPUTATION BELOW KNEE ;  Surgeon: BConrad  MD;  [facility]  Service: Vascular;  Laterality: Right;  . I&d extremity Right 04/14/2013    Procedure: IRRIGATION AND DEBRIDEMENT OF RIGHT  GROIN & PLACEMENT OF VAC DRESSING;  Surgeon: BConrad  MD;  [facility];  Service: Vascular;  Laterality: Right;  . Abdominal aortagram N/A 04/30/2012    Procedure: ABDOMINAL AORTAGRAM;  Surgeon: Conrad Perez[place], MD;  [facility] C[facility] LAB;  Service: Cardiovascular;  Laterality: N/A;  . Lower extremity angiogram Bilateral 04/30/2012    Procedure: LOWER EXTREMITY ANGIOGRAM;  Surgeon: Conrad Hamden, MD;  Location: [facility] CATH LAB;  Service: Cardiovascular;  Laterality: Bilateral;  bilat lower extrem angio  . Eye surgery Right     Catarct  . Coronary artery bypass graft  03/2000    quadruple  . Hernia repair      Hiatal Hernia  . Wound debridement Right 04/20/2014    s/p  rt bka  with application of wound vac  . I&d extremity Right 04/20/2014    Procedure: DEBRIDEMENT OF RIGHT BELOW KNEE AMPUTATION STUMP;  Surgeon: Conrad Niobrara, MD;  Location: [facility];  Service: Vascular;   Laterality: Right;  . Application of wound vac Right 04/20/2014    Procedure: APPLICATION OF WOUND VAC;  Surgeon: Conrad Burden, MD;  Location: [facility];  Service: Vascular;  Laterality: Right;    There were no vitals filed for this visit.  Visit Diagnosis:  Status post bilateral below knee amputation  Abnormality of gait  Unsteadiness  Encounter for prosthetic gait training  Weakness generalized  Balance problems      Subjective Assessment - 01/02/15 1407    Subjective No falls. Still having issues with prosthesis cutting into back of left. Saw Chris, had adjustments made, now feels like it's cutting into him.    Currently in Pain? Yes   Pain Score 4    Pain Location Knee   Pain Orientation Left;Posterior   Pain Descriptors / Indicators Sharp;Stabbing;Tender;Aching   Pain Type Acute pain   Pain Onset 1 to 4 weeks ago   Pain Frequency Intermittent   Aggravating Factors  wearing prosthesis with knee bent   Pain Relieving Factors prosthesis off and no pressure on area            [facility] Adult PT Treatment/Exercise - 01/02/15 1409    Transfers   Sit to Stand 5: Supervision;With upper extremity assist;With armrests;From chair/3-in-1   Sit to Stand Details Verbal cues for sequencing;Verbal cues for technique;Verbal cues for safe use of DME/AE   Stand to Sit 5: Supervision;With upper extremity assist;With armrests;To chair/3-in-1   Stand to Sit Details (indicate cue type and reason) Verbal cues for technique;Verbal cues for precautions/safety;Verbal cues for safe use of DME/AE   Ambulation/Gait   Ambulation/Gait Yes   Ambulation/Gait Assistance 4: Min guard;4: Min assist;3: Mod assist   Ambulation/Gait Assistance Details mod assist a few times with gait due to left knee buckling with stance phase, decreased right foot clearance with gait. Balance further challenged by pt trying to not use arms on rollator which would cause instability in the prostheses needing increased assist for  balance. Pt advised to use arms on rollator for now until prostheses can be adjusted.                                 Ambulation Distance (Feet) 120 Feet  x 2 reps   Assistive device Rollator;Prostheses   Gait Pattern Step-through pattern;Decreased step length - right;Decreased step length - left;Decreased stride length;Wide base of support;Trunk flexed;Poor foot clearance - left;Decreased hip/knee flexion - right;Decreased hip/knee flexion - left;Poor foot clearance - right  left knee buckling  Ambulation Surface Level;Indoor   Prosthetics   Prosthetic Care Comments  Saw prosthetist who adjusted prostheses, feel better with alignment, however feel's it is still digging into back of his left knee. Wound checked, healing with scab present. However, pt has been out of prostheses all weekend so it can heal and feels it's still irritating/digging into the back of his knee.                                               Current prosthetic wear tolerance (days/week)  daily   Current prosthetic wear tolerance (#hours/day)  most of awake hours drying as needed  had them off all weekend due to the wound/digging in knee   Residual limb condition  open area on back of left knee with scab present   Education Provided Residual limb care;Correct ply sock adjustment;Proper weight-bearing schedule/adjustment   Person(s) Educated Patient   Education Method Explanation;Verbal cues;Demonstration   Education Method Verbalized understanding   Donning Prosthesis Modified independent (device/increased time)   Doffing Prosthesis Modified independent (device/increased time)            PT Short Term Goals - 12/15/14 1400    PT SHORT TERM GOAL #1   Title Patient tolerates wear of bilateral prostheses >10 hours total per day without skin issues or tenderness. (Target Date: 12/14/2014)   Baseline MET 12/15/2014 Patient reports wear >10hrs with no issues.   Status Achieved   PT SHORT TERM GOAL #2   Title patient  verbalizes signs of sweating in prostheses and proper drying to prevent skin issues. (Target Date: 12/14/2014)   Baseline MET 12/15/2014   Status Achieved   PT SHORT TERM GOAL #3   Title Patient ambulates 250' with rollator walker & prostheses with supervision. (Target Date: 12/14/2014)   Baseline 9/6: pt supervision until fatigued, not able to go 250 feet, needs rest break before. Max distance to date is 220 feet.   Status Partially Met   PT SHORT TERM GOAL #4   Title Patient negotiates ramps & curbs with rollator walker & prostheses with supervision. (Target Date: 12/14/2014)   Baseline MET 12/15/2014   Status Achieved           PT Long Term Goals - 11/14/14 1100    PT LONG TERM GOAL #1   Title Patient tolerates wear of bilateral TTA prostheses >90% of awake hours without skin issues or tenderness to enable function throughout his day. (Target Date: 01/13/2015)   Time 2   Period Months   Status New   PT LONG TERM GOAL #2   Title patient verbalizes proper prosthetic care including adjusting ply socks to enable safe prosthetic use.  (Target Date: 01/13/2015)   Time 2   Period Months   Status New   PT LONG TERM GOAL #3   Title Berg Balance >30/56  (Target Date: 01/13/2015)   Time 2   Period Months   Status New   PT LONG TERM GOAL #4   Title Patient ambulates >300' with LRAD & prostheses modified independent to enable community access.  (Target Date: 01/13/2015)   Time 2   Period Months   Status New   PT LONG TERM GOAL #5   Title Patient negotiates ramps, curbs & stairs (1 rail) with LRAD & prostheses modified independent for community access.  (Target Date: 01/13/2015)   Time 2  Period Months   Status New           Plan - 01/02/15 1445    Clinical Impression Statement Pt continues to have issues with the left prosthesis and it digging into the back of the knee. Alingment on the right side appears to be okay. Call placed to prosthetist andt then his office to set up appointment for  pt to see him. Pt to see prosthetist tomorrow for further adjustments and he now being thrown into a flexion pattern causing his knee to buckle on the left in terminal stance. Limited progress toward goals today due to prosthetic issues.                               Pt will benefit from skilled therapeutic intervention in order to improve on the following deficits Abnormal gait;Decreased activity tolerance;Decreased balance;Decreased knowledge of precautions;Decreased knowledge of use of DME;Decreased strength;Postural dysfunction;Prosthetic Dependency   Rehab Potential Good   PT Frequency 2x / week   PT Duration Other (comment)  9 weeks (60 days)   PT Treatment/Interventions ADLs/Self Care Home Management;DME Instruction;Gait training;Stair training;Functional mobility training;Therapeutic activities;Therapeutic exercise;Balance training;Neuromuscular re-education;Patient/family education;Prosthetic Training   PT Next Visit Plan gait with rollator for commuinty including ramps and curbs. Household with cane.   Consulted and Agree with Plan of Care Patient        Problem List Patient Active Problem List   Diagnosis Date Noted  . Microalbuminuria 09/29/2014  . 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 Phantom limb pain 08/16/2013  . Pain in limb- Right stump 07/06/2013  . Atherosclerosis of native arteries of the extremities with ulceration 06/25/2013  . S/P bilateral BKA (below knee amputation) 04/21/2013  . Chronic systolic heart failure 27/61/4709  . 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 S/P BKA (below knee amputation) bilateral 09/10/2012  . SVT (supraventricular tachycardia) 09/02/2012  . Hyperlipidemia with target LDL less than 70 01/02/2012  . Type II diabetes mellitus with manifestations, uncontrolled 05/03/2010  . Coronary atherosclerosis 04/13/2010  . ATRIAL FIBRILLATION, PAROXYSMAL 04/13/2010    Willow Ora 01/02/2015, 5:07 PM  Willow Ora, PTA,  [facility] [address], [place], [place] [address] [phone] 01/02/2015, 5:08 PM

## 2015-01-05 ENCOUNTER — Ambulatory Visit: Payer: Commercial Managed Care - HMO | Admitting: Physical Therapy

## 2015-01-05 ENCOUNTER — Encounter: Payer: Self-pay | Admitting: Physical Therapy

## 2015-01-05 DIAGNOSIS — Z89511 Acquired absence of right leg below knee: Secondary | ICD-10-CM | POA: Diagnosis not present

## 2015-01-05 DIAGNOSIS — R2681 Unsteadiness on feet: Secondary | ICD-10-CM

## 2015-01-05 DIAGNOSIS — R531 Weakness: Secondary | ICD-10-CM

## 2015-01-05 DIAGNOSIS — R29818 Other symptoms and signs involving the nervous system: Secondary | ICD-10-CM | POA: Diagnosis not present

## 2015-01-05 DIAGNOSIS — Z5189 Encounter for other specified aftercare: Secondary | ICD-10-CM | POA: Diagnosis not present

## 2015-01-05 DIAGNOSIS — R269 Unspecified abnormalities of gait and mobility: Secondary | ICD-10-CM

## 2015-01-05 DIAGNOSIS — Z89512 Acquired absence of left leg below knee: Principal | ICD-10-CM

## 2015-01-05 DIAGNOSIS — Z4789 Encounter for other orthopedic aftercare: Secondary | ICD-10-CM

## 2015-01-06 NOTE — Therapy (Signed)
[facility] [address] [place], [place], [address] Phone: [phone]   Fax:  [phone]  Physical Therapy Treatment  Patient Details  Name: Leroy [name] MRN: [id number] Date of Birth: 03/23/1946 Referring Provider:  Janith Lima, MD  Encounter Date: 01/05/2015      PT End of Session - 01/05/15 1400    Visit Number 14   Number of Visits 18   Date for PT Re-Evaluation 01/13/15   Authorization Type G-code every 10th visit   PT Start Time 1402   PT Stop Time 1445   PT Time Calculation (min) 43 min   Equipment Utilized During Treatment Gait belt   Activity Tolerance Patient tolerated treatment well   Behavior During Therapy [facility] for tasks assessed/performed      Past Medical History  Diagnosis Date  . Hyperlipidemia   . Ischemic cardiomyopathy     a. EF 40% 2010. b. Echo (2/14):  mild LVH, EF 30-35%, diff HK, Gr 1 DD, MAC, mild MR, mod LAE, PASP 39  . Cataract   . Hypotension   . Paroxysmal atrial fibrillation   . Peripheral vascular disease     a. s/p L BKA 2014.  [name] History of blood clots     in legs--this was in 2011--has been off of Coumadin 49month;takes Xarelto daily  . Joint pain   . H/O hiatal hernia   . Chronic kidney disease     on Lisinopril to protect kidneys d/t being on Metformin per pt  . Myocardial infarction     total of 8 heart attacks;last one in 2011  . Atrial flutter   . H/O medication noncompliance     Due to insurance issues  . Chronic systolic CHF (congestive heart failure)   . Coronary artery disease     a. s/p remote CABG 2001 at [facility] b. Cath 03/2010: for med rx.;  c.  [facility] Adammyoview (1/14):  Large Inferior apical MI, very small area of peri-infarct ischemia toward the inferior apical segment. EF 19%.  Med Rx was continued.    . Diabetes mellitus 1979    takes Metformni daily  and Lantus 50units bid  . DVT (deep venous thrombosis) 2011    legs  . Peripheral neuropathy    . Shortness of breath dyspnea     Past Surgical History  Procedure Laterality Date  . Revasculariztion  2011/2010    x 2   . Tonsillectomy    . Cardiac catheterization  03/19/10  . Abdominal aortagram  04-30-12  . Adenoidectomy    . Amputation Left 06/03/2012    Procedure: AMPUTATION BELOW KNEE;  Surgeon: BConrad [place] MD;  [facility]  Service: Vascular;  Laterality: Left;  . Coronary angioplasty  2002    3 stents  . Endarterectomy femoral Right 03/25/2013    Procedure: ENDARTERECTOMY FEMORAL ;  Surgeon: BConrad [place]ia MD;  [place] [facility]  Service: Vascular;  Laterality: Right;  . Patch angioplasty Right 03/25/2013    Procedure: PATCH ANGIOPLASTY USING VASCU-GUARD PERIPHERAL VASCULAR PATCH;  Surgeon: BConrad [place] MD;  [facility]  Service: Vascular;  Laterality: Right;  . Amputation Right 04/14/2013    Procedure: AMPUTATION BELOW KNEE ;  Surgeon: BConrad [place] MD;  [facility]  Service: Vascular;  Laterality: Right;  . I&d extremity Right 04/14/2013    Procedure: IRRIGATION AND DEBRIDEMENT OF RIGHT  GROIN & PLACEMENT OF VAC DRESSING;  Surgeon: BConrad [place] MD;  [facility];  Service: Vascular;  Laterality: Right;  . Abdominal aortagram N/A 04/30/2012    Procedure: ABDOMINAL AORTAGRAM;  Surgeon: Conrad [place], MD;  Location: [facility] CATH LAB;  Service: Cardiovascular;  Laterality: N/A;  . Lower extremity angiogram Bilateral 04/30/2012    Procedure: LOWER EXTREMITY ANGIOGRAM;  Surgeon: Conrad Magn[place], MD;  [facility]: [facility] CATH LAB;  Service: Cardiovascular;  Laterality: Bilateral;  bilat lower extrem angio  . Eye surgery Right     Catarct  . Coronary artery bypass graft  03/2000    quadruple  . Hernia repair      Hiatal Hernia  . Wound debridement Right 04/20/2014    s/p  rt bka  with application of wound vac  . I&d extremity Right 04/20/2014    Procedure: DEBRIDEMENT OF RIGHT BELOW KNEE AMPUTATION STUMP;  Surgeon: Conrad Pass Christian, MD;  Location: [facility];  Service: Vascular;   Laterality: Right;  . Application of wound vac Right 04/20/2014    Procedure: APPLICATION OF WOUND VAC;  Surgeon: Conrad Col[place], MD;  Location: [place];  Service: Vascular;  Laterality: Right;    There were no vitals filed for this visit.  Visit Diagnosis:  Status post bilateral below knee amputation  Abnormality of gait  Unsteadiness  Encounter for prosthetic gait training  Weakness generalized      Subjective Assessment - 01/05/15 1401    Subjective Prosthetist trimmed back of left socket, added distal pad and added sock to lift limb out of prosthesis. It feels "good"    Currently in Pain? No/denies                         [facility] Adult PT Treatment/Exercise - 01/05/15 1400    Transfers   Sit to Stand 5: Supervision;With upper extremity assist;With armrests;From chair/3-in-1  from rollator walker seat   Sit to Stand Details Verbal cues for sequencing;Verbal cues for technique;Verbal cues for safe use of DME/AE   Stand to Sit 5: Supervision;With upper extremity assist;With armrests;To chair/3-in-1  to rollator walker seat   Stand to Sit Details (indicate cue type and reason) Verbal cues for technique;Verbal cues for precautions/safety;Verbal cues for safe use of DME/AE   Ambulation/Gait   Ambulation/Gait Yes   Ambulation/Gait Assistance 5: Supervision;4: Min assist  MinA with posterior balance loss recovery 1 time,    Ambulation/Gait Assistance Details Patient had 2 posterior balance losses walking thru doorways with transitioning weight anteriorly, posteriorly, anteriorly: first balance loss with minA from PT to allow response time for posterior step strategy; 2nd balance loss posteriorly resulted in sitting onto floor with appropriate rounding, rolling to protect his head without reaching which would risk an UE fracture.    Ambulation Distance (Feet) 80 Feet  80' X 4, Pt had increased SOB limiting distance today   Assistive device Rollator;Prostheses   Gait  Pattern Step-through pattern;Decreased step length - right;Decreased step length - left;Decreased stride length;Wide base of support;Trunk flexed;Poor foot clearance - left;Decreased hip/knee flexion - right;Decreased hip/knee flexion - left;Poor foot clearance - right  left knee buckling   Ambulation Surface Indoor;Level;Outdoor;Paved   Ramp 4: Min assist  rollator walker & prostheses   Ramp Details (indicate cue type and reason) cues on brake management for safety   Curb 4: Min assist  rollator walker & prostheses   Curb Details (indicate cue type and reason) cues on technique including brake management & prostheses   Balance   Balance Assessed Yes  Dynamic Standing Balance   Alternating foot traps comments: standing in corner with light UE support on rollator walker: alternate Rt/ Lt stepping forward, side, and posteriorly   Head turns comments: standing in corner with light UE support on rollator walker eyes open head turns right & left   Head nods comments: standing in corner with light UE support on rollator walker with eyes open with head movements up/down   Prosthetics   Prosthetic Care Comments  Saw prosthetist who adjusted prostheses, feel better with alignment, however feel's it is still digging into back of his left knee. Wound checked, healing with scab present. However, pt has been out of prostheses all weekend so it can heal and feels it's still irritating/digging into the back of his knee.                                               Current prosthetic wear tolerance (days/week)  daily   Current prosthetic wear tolerance (#hours/day)  most of awake hours drying as needed  had them off all weekend due to the wound/digging in knee   Residual limb condition  open area on back of left knee with scab present   Education Provided Residual limb care;Correct ply sock adjustment;Proper weight-bearing schedule/adjustment                PT Education - 01/05/15 1400     Education provided Yes   Education Details standing in corner with anterior rollator support for safety: alternate stepping & head turns right/left, up/down   Person(s) Educated Patient   Methods Explanation;Demonstration;Verbal cues   Comprehension Verbalized understanding;Returned demonstration;Verbal cues required;Need further instruction          PT Short Term Goals - 12/15/14 1400    PT SHORT TERM GOAL #1   Title Patient tolerates wear of bilateral prostheses >10 hours total per day without skin issues or tenderness. (Target Date: 12/14/2014)   Baseline MET 12/15/2014 Patient reports wear >10hrs with no issues.   Status Achieved   PT SHORT TERM GOAL #2   Title patient verbalizes signs of sweating in prostheses and proper drying to prevent skin issues. (Target Date: 12/14/2014)   Baseline MET 12/15/2014   Status Achieved   PT SHORT TERM GOAL #3   Title Patient ambulates 250' with rollator walker & prostheses with supervision. (Target Date: 12/14/2014)   Baseline 9/6: pt supervision until fatigued, not able to go 250 feet, needs rest break before. Max distance to date is 220 feet.   Status Partially Met   PT SHORT TERM GOAL #4   Title Patient negotiates ramps & curbs with rollator walker & prostheses with supervision. (Target Date: 12/14/2014)   Baseline MET 12/15/2014   Status Achieved           PT Long Term Goals - 11/14/14 1100    PT LONG TERM GOAL #1   Title Patient tolerates wear of bilateral TTA prostheses >90% of awake hours without skin issues or tenderness to enable function throughout his day. (Target Date: 01/13/2015)   Time 2   Period Months   Status New   PT LONG TERM GOAL #2   Title patient verbalizes proper prosthetic care including adjusting ply socks to enable safe prosthetic use.  (Target Date: 01/13/2015)   Time 2   Period Months   Status New   PT LONG  TERM GOAL #3   Title Berg Balance >30/56  (Target Date: 01/13/2015)   Time 2   Period Months   Status New   PT  LONG TERM GOAL #4   Title Patient ambulates >300' with LRAD & prostheses modified independent to enable community access.  (Target Date: 01/13/2015)   Time 2   Period Months   Status New   PT LONG TERM GOAL #5   Title Patient negotiates ramps, curbs & stairs (1 rail) with LRAD & prostheses modified independent for community access.  (Target Date: 01/13/2015)   Time 2   Period Months   Status New               Plan - 01/05/15 1400    Clinical Impression Statement Patient had increased shortness of breath today limiting activity duration. Patient was able to perform at posterior step strategy with minA to allow increased time to respond but fell without assist.    Pt will benefit from skilled therapeutic intervention in order to improve on the following deficits Abnormal gait;Decreased activity tolerance;Decreased balance;Decreased knowledge of precautions;Decreased knowledge of use of DME;Decreased strength;Postural dysfunction;Prosthetic Dependency   Rehab Potential Good   PT Frequency 2x / week   PT Duration Other (comment)  9 weeks (60 days)   PT Treatment/Interventions ADLs/Self Care Home Management;DME Instruction;Gait training;Stair training;Functional mobility training;Therapeutic activities;Therapeutic exercise;Balance training;Neuromuscular re-education;Patient/family education;Prosthetic Training   PT Next Visit Plan Assess LTGs, gait with rollator for commuinty including ramps and curbs. Household with cane.   Consulted and Agree with Plan of Care Patient        Problem List Patient Active Problem List   Diagnosis Date Noted  . Microalbuminuria 09/29/2014  . 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 Phantom limb pain 08/16/2013  . Pain in limb- Right stump 07/06/2013  . Atherosclerosis of native arteries of the extremities with ulceration 06/25/2013  . S/P bilateral BKA (below knee amputation) 04/21/2013  . Chronic systolic heart failure  00/37/0488  . 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 S/P BKA (below knee amputation) bilateral 09/10/2012  . SVT (supraventricular tachycardia) 09/02/2012  . Hyperlipidemia with target LDL less than 70 01/02/2012  . Type II diabetes mellitus with manifestations, uncontrolled 05/03/2010  . Coronary atherosclerosis 04/13/2010  . ATRIAL FIBRILLATION, PAROXYSMAL 04/13/2010    Jamey Reas PT, DPT 01/06/2015, 12:29 PM  [facility] [address] [place], [place], [address] Phone: [phone]   Fax:  [phone]

## 2015-01-09 ENCOUNTER — Encounter: Payer: Self-pay | Admitting: Physical Therapy

## 2015-01-09 ENCOUNTER — Ambulatory Visit: Payer: Commercial Managed Care - HMO | Attending: Vascular Surgery | Admitting: Physical Therapy

## 2015-01-09 DIAGNOSIS — Z5189 Encounter for other specified aftercare: Secondary | ICD-10-CM | POA: Insufficient documentation

## 2015-01-09 DIAGNOSIS — R29818 Other symptoms and signs involving the nervous system: Secondary | ICD-10-CM | POA: Insufficient documentation

## 2015-01-09 DIAGNOSIS — Z89511 Acquired absence of right leg below knee: Secondary | ICD-10-CM | POA: Insufficient documentation

## 2015-01-09 DIAGNOSIS — R531 Weakness: Secondary | ICD-10-CM | POA: Diagnosis not present

## 2015-01-09 DIAGNOSIS — Z4789 Encounter for other orthopedic aftercare: Secondary | ICD-10-CM

## 2015-01-09 DIAGNOSIS — R2689 Other abnormalities of gait and mobility: Secondary | ICD-10-CM

## 2015-01-09 DIAGNOSIS — R2681 Unsteadiness on feet: Secondary | ICD-10-CM | POA: Insufficient documentation

## 2015-01-09 DIAGNOSIS — R269 Unspecified abnormalities of gait and mobility: Secondary | ICD-10-CM | POA: Insufficient documentation

## 2015-01-09 DIAGNOSIS — Z89512 Acquired absence of left leg below knee: Secondary | ICD-10-CM | POA: Diagnosis not present

## 2015-01-09 NOTE — Therapy (Signed)
[facility] [address] [place], [place], [address] Phone: [phone]   Fax:  [phone]  Physical Therapy Treatment  Patient Details  Name: Leroy [name] MRN: [id number] Date of Birth: 1945-10-14 Referring Provider:  Janith Lima, MD  Encounter Date: 01/09/2015      PT End of Session - 01/09/15 1345    Visit Number 15   Number of Visits 18   Date for PT Re-Evaluation 01/13/15   Authorization Type G-code every 10th visit   PT Start Time 1345   PT Stop Time 1430   PT Time Calculation (min) 45 min   Equipment Utilized During Treatment Gait belt   Activity Tolerance Patient tolerated treatment well   Behavior During Therapy [facility] for tasks assessed/performed      Past Medical History  Diagnosis Date  . Hyperlipidemia   . Ischemic cardiomyopathy     a. EF 40% 2010. b. Echo (2/14):  mild LVH, EF 30-35%, diff HK, Gr 1 DD, MAC, mild MR, mod LAE, PASP 39  . Cataract   . Hypotension   . Paroxysmal atrial fibrillation (HCC)   . Peripheral vascular disease ([place])     a. s/p L BKA 2014.  [name] History of blood clots     in legs--this was in 2011--has been off of Coumadin 33month;takes Xarelto daily  . Joint pain   . H/O hiatal hernia   . Chronic kidney disease     on Lisinopril to protect kidneys d/t being on Metformin per pt  . [place]ocardial infarction ([place]     total of 8 heart attacks;last one in 2011  . Atrial flutter ([place]   . H/O medication noncompliance     Due to insurance issues  . Chronic systolic CHF (congestive heart failure) ([place]   . Coronary artery disease     a. s/p remote CABG 2001 at [facility] b. Cath 03/2010: for med rx.;  c.  [facility] Adam[facility] (1/14):  Large Inferior apical MI, very small area of peri-infarct ischemia toward the inferior apical segment. EF 19%.  Med Rx was continued.    . Diabetes mellitus 1979    takes Metformni daily  and Lantus 50units bid  . DVT (deep venous thrombosis) ([place] 2011     legs  . Peripheral neuropathy ([place]   . Shortness of breath dyspnea     Past Surgical History  Procedure Laterality Date  . Revasculariztion  2011/2010    x 2   . Tonsillectomy    . Cardiac catheterization  03/19/10  . Abdominal aortagram  04-30-12  . Adenoidectomy    . Amputation Left 06/03/2012    Procedure: AMPUTATION BELOW KNEE;  Surgeon: BConrad Brenham MD;  [facility] [facility] [place]  Service: Vascular;  Laterality: Left;  . Coronary angioplasty  2002    3 stents  . Endarterectomy femoral Right 03/25/2013    Procedure: ENDARTERECTOMY FEMORAL ;  Surgeon: BConrad Turner MD;  [place] [facility]  Service: Vascular;  Laterality: Right;  . Patch angioplasty Right 03/25/2013    Procedure: PATCH ANGIOPLASTY USING VASCU-GUARD PERIPHERAL VASCULAR PATCH;  Surgeon: BConrad Lyons MD;  [place]  Service: Vascular;  Laterality: Right;  . Amputation Right 04/14/2013    Procedure: AMPUTATION BELOW KNEE ;  Surgeon: BConrad West Mifflin MD;  [place]  Service: Vascular;  Laterality: Right;  . I&d extremity Right 04/14/2013    Procedure: IRRIGATION AND DEBRIDEMENT OF RIGHT  GROIN & PLACEMENT OF VAC DRESSING;  Surgeon: Conrad Palmer, MD;  Location: [facility];  Service: Vascular;  Laterality: Right;  . Abdominal aortagram N/A 04/30/2012    Procedure: ABDOMINAL AORTAGRAM;  Surgeon: Conrad Braddyville, MD;  [facility];  Service: Cardiovascular;  Laterality: N/A;  . Lower extremity angiogram Bilateral 04/30/2012    Procedure: LOWER EXTREMITY ANGIOGRAM;  Surgeon: Conrad Risco, MD;  Location: [facility] CATH LAB;  Service: Cardiovascular;  Laterality: Bilateral;  bilat lower extrem angio  . Eye surgery Right     Catarct  . Coronary artery bypass graft  03/2000    quadruple  . Hernia repair      Hiatal Hernia  . Wound debridement Right 04/20/2014    s/p  rt bka  with application of wound vac  . I&d extremity Right 04/20/2014    Procedure: DEBRIDEMENT OF RIGHT BELOW KNEE AMPUTATION STUMP;  Surgeon: Conrad New Kent, MD;   Location: [facility];  Service: Vascular;  Laterality: Right;  . Application of wound vac Right 04/20/2014    Procedure: APPLICATION OF WOUND VAC;  Surgeon: Conrad Weeping [place], MD;  Location: [place];  Service: Vascular;  Laterality: Right;    There were no vitals filed for this visit.  Visit Diagnosis:  Status post bilateral below knee amputation ([place])  Abnormality of gait  Unsteadiness  Encounter for prosthetic gait training  Weakness generalized  Balance problems      Subjective Assessment - 01/09/15 1353    Subjective (p) The changes from last week are still going well. He reports wearing prostheses awake hours except when in bed or bathtub.    Currently in Pain? (p) No/denies                         [facility] Adult PT Treatment/Exercise - 01/09/15 1345    Transfers   Sit to Stand 6: Modified independent (Device/Increase time);With upper extremity assist;With armrests;From chair/3-in-1  from rollator walker seat   Stand to Sit 6: Modified independent (Device/Increase time);With upper extremity assist;With armrests;To chair/3-in-1  to rollator walker seat   Ambulation/Gait   Ambulation/Gait Yes   Ambulation/Gait Assistance 5: Supervision   Ambulation/Gait Assistance Details Patient reports walking at home holding walls at times & cane some times. PT assessed gait with cane & with wall contact - unsafe with increased trunk flexion, hip/knee flexion with minimal assist but appears unstable;   Ambulation Distance (Feet) 150 Feet  150', 80' X 2 with rollator walker, 20' cane & 20' wall    Assistive device Rollator;Prostheses;Straight cane   Gait Pattern Step-through pattern;Decreased step length - right;Decreased step length - left;Decreased stride length;Wide base of support;Trunk flexed;Poor foot clearance - left;Decreased hip/knee flexion - right;Decreased hip/knee flexion - left;Poor foot clearance - right  left knee buckling   Ambulation Surface Indoor;Level   Ramp 5:  Supervision  rollator walker & prostheses   Ramp Details (indicate cue type and reason) cues on brake management for safety   Curb 5: Supervision  rollator walker & prostheses   Curb Details (indicate cue type and reason) cues on brake management for safety   Prosthetics   Prosthetic Care Comments  reminder to dry limb & liner with sweating   Current prosthetic wear tolerance (days/week)  daily   Current prosthetic wear tolerance (#hours/day)  all awake hours out of bed except bathing   Education Provided Residual limb care   Person(s) Educated Patient   Education Method Explanation   Education Method Verbalized understanding  Donning Prosthesis Modified independent (device/increased time)   Doffing Prosthesis Modified independent (device/increased time)                  PT Short Term Goals - 12/15/14 1400    PT SHORT TERM GOAL #1   Title Patient tolerates wear of bilateral prostheses >10 hours total per day without skin issues or tenderness. (Target Date: 12/14/2014)   Baseline MET 12/15/2014 Patient reports wear >10hrs with no issues.   Status Achieved   PT SHORT TERM GOAL #2   Title patient verbalizes signs of sweating in prostheses and proper drying to prevent skin issues. (Target Date: 12/14/2014)   Baseline MET 12/15/2014   Status Achieved   PT SHORT TERM GOAL #3   Title Patient ambulates 250' with rollator walker & prostheses with supervision. (Target Date: 12/14/2014)   Baseline 9/6: pt supervision until fatigued, not able to go 250 feet, needs rest break before. Max distance to date is 220 feet.   Status Partially Met   PT SHORT TERM GOAL #4   Title Patient negotiates ramps & curbs with rollator walker & prostheses with supervision. (Target Date: 12/14/2014)   Baseline MET 12/15/2014   Status Achieved           PT Long Term Goals - 11/14/14 1100    PT LONG TERM GOAL #1   Title Patient tolerates wear of bilateral TTA prostheses >90% of awake hours without skin issues  or tenderness to enable function throughout his day. (Target Date: 01/13/2015)   Time 2   Period Months   Status New   PT LONG TERM GOAL #2   Title patient verbalizes proper prosthetic care including adjusting ply socks to enable safe prosthetic use.  (Target Date: 01/13/2015)   Time 2   Period Months   Status New   PT LONG TERM GOAL #3   Title Berg Balance >30/56  (Target Date: 01/13/2015)   Time 2   Period Months   Status New   PT LONG TERM GOAL #4   Title Patient ambulates >300' with LRAD & prostheses modified independent to enable community access.  (Target Date: 01/13/2015)   Time 2   Period Months   Status New   PT LONG TERM GOAL #5   Title Patient negotiates ramps, curbs & stairs (1 rail) with LRAD & prostheses modified independent for community access.  (Target Date: 01/13/2015)   Time 2   Period Months   Status New               Plan - 01/09/15 1345    Clinical Impression Statement Patient does not appear safe to ambulate with cane or no device with prosttheses. Patient is ready for discharge at next appointment.    Pt will benefit from skilled therapeutic intervention in order to improve on the following deficits Abnormal gait;Decreased activity tolerance;Decreased balance;Decreased knowledge of precautions;Decreased knowledge of use of DME;Decreased strength;Postural dysfunction;Prosthetic Dependency   Rehab Potential Good   PT Frequency 2x / week   PT Duration Other (comment)  9 weeks (60 days)   PT Treatment/Interventions ADLs/Self Care Home Management;DME Instruction;Gait training;Stair training;Functional mobility training;Therapeutic activities;Therapeutic exercise;Balance training;Neuromuscular re-education;Patient/family education;Prosthetic Training   PT Next Visit Plan Assess LTGs, gait with rollator for commuinty including ramps and curbs.   Consulted and Agree with Plan of Care Patient        Problem List Patient Active Problem List   Diagnosis Date  Noted  . Microalbuminuria 09/29/2014  . Erectile dysfunction  due to arterial insufficiency 01/28/2014  . Phantom limb pain ([place]) 08/16/2013  . Pain in limb- Right stump 07/06/2013  . Atherosclerosis of native arteries of the extremities with ulceration ([place]) 06/25/2013  . S/P bilateral BKA (below knee amputation) ([place]) 04/21/2013  . Chronic systolic heart failure ([place]) 04/15/2013  . Femoral artery stenosis ([place]) 03/25/2013  . S/P BKA (below knee amputation) bilateral ([place]) 09/10/2012  . SVT (supraventricular tachycardia) ([place]) 09/02/2012  . Hyperlipidemia with target LDL less than 70 01/02/2012  . Type II diabetes mellitus with manifestations, uncontrolled ([place]) 05/03/2010  . Coronary atherosclerosis 04/13/2010  . ATRIAL FIBRILLATION, PAROXYSMAL 04/13/2010    Jamey Reas PT, DPT 01/09/2015, 7:58 PM  [facility] [address] [place], [place], [address] Phone: [phone]   Fax:  [phone]

## 2015-01-12 ENCOUNTER — Ambulatory Visit: Payer: Commercial Managed Care - HMO | Admitting: Physical Therapy

## 2015-01-12 ENCOUNTER — Encounter: Payer: Self-pay | Admitting: Physical Therapy

## 2015-01-12 DIAGNOSIS — R531 Weakness: Secondary | ICD-10-CM

## 2015-01-12 DIAGNOSIS — Z5189 Encounter for other specified aftercare: Secondary | ICD-10-CM | POA: Diagnosis not present

## 2015-01-12 DIAGNOSIS — Z89511 Acquired absence of right leg below knee: Secondary | ICD-10-CM | POA: Diagnosis not present

## 2015-01-12 DIAGNOSIS — R269 Unspecified abnormalities of gait and mobility: Secondary | ICD-10-CM | POA: Diagnosis not present

## 2015-01-12 DIAGNOSIS — Z4789 Encounter for other orthopedic aftercare: Secondary | ICD-10-CM

## 2015-01-12 DIAGNOSIS — R2689 Other abnormalities of gait and mobility: Secondary | ICD-10-CM

## 2015-01-12 DIAGNOSIS — Z89512 Acquired absence of left leg below knee: Principal | ICD-10-CM

## 2015-01-12 DIAGNOSIS — R2681 Unsteadiness on feet: Secondary | ICD-10-CM | POA: Diagnosis not present

## 2015-01-12 DIAGNOSIS — R29818 Other symptoms and signs involving the nervous system: Secondary | ICD-10-CM | POA: Diagnosis not present

## 2015-01-12 NOTE — Therapy (Signed)
[facility] [address] [place], [place], [address] Phone: [phone]   Fax:  [phone]  Physical Therapy Treatment  Patient Details  Name: Leroy [name] MRN: [id number] Date of Birth: Aug 18, 1945 Referring Provider:  Janith Lima, MD  Encounter Date: 01/12/2015  PHYSICAL THERAPY DISCHARGE SUMMARY  Visits from Start of Care: 16  Current functional level related to goals / functional outcomes: See below   Remaining deficits: See below   Education / Equipment: Prosthetic care  Plan: Patient agrees to discharge.  Patient goals were met. Patient is being discharged due to meeting the stated rehab goals.  ?????           PT End of Session - 01/12/15 1400    Visit Number 16   Number of Visits 18   Date for PT Re-Evaluation 01/13/15   Authorization Type G-code every 10th visit   PT Start Time 1400   PT Stop Time 1445   PT Time Calculation (min) 45 min   Equipment Utilized During Treatment Gait belt   Activity Tolerance Patient tolerated treatment well   Behavior During Therapy WFL for tasks assessed/performed      Past Medical History  Diagnosis Date  . Hyperlipidemia   . Ischemic cardiomyopathy     a. EF 40% 2010. b. Echo (2/14):  mild LVH, EF 30-35%, diff HK, Gr 1 DD, MAC, mild MR, mod LAE, PASP 39  . Cataract   . Hypotension   . Paroxysmal atrial fibrillation (HCC)   . Peripheral vascular disease ([place])     a. s/p L BKA 2014.  [name] History of blood clots     in legs--this was in 2011--has been off of Coumadin 22month;takes Xarelto daily  . Joint pain   . H/O hiatal hernia   . Chronic kidney disease     on Lisinopril to protect kidneys d/t being on Metformin per pt  . Myocardial infarction ([place]     total of 8 heart attacks;last one in 2011  . Atrial flutter ([place]   . H/O medication noncompliance     Due to insurance issues  . Chronic systolic CHF (congestive heart failure) ([facility]   .  Coronary artery disease     a. s/p remote CABG 2001 at [facility] b. Cath 03/2010: for med rx.;  c.  [place] Adammyoview (1/14):  Large Inferior apical MI, very small area of peri-infarct ischemia toward the inferior apical segment. EF 19%.  Med Rx was continued.    . Diabetes mellitus 1979    takes Metformni daily  and Lantus 50units bid  . DVT (deep venous thrombosis) ([place]    legs  . Peripheral neuropathy ([place]   . Shortness of breath dyspnea     Past Surgical History  Procedure Laterality Date  . Revasculariztion  2011/2010    x 2   . Tonsillectomy    . Cardiac catheterization  03/19/10  . Abdominal aortagram  04-30-12  . Adenoidectomy    . Amputation Left 06/03/2012    Procedure: AMPUTATION BELOW KNEE;  Surgeon: BConrad [place] MD;  [facility] [place]  Service: Vascular;  Laterality: Left;  . Coronary angioplasty  2002    3 stents  . Endarterectomy femoral Right 03/25/2013    Procedure: ENDARTERECTOMY FEMORAL ;  Surgeon: BConrad Wal[place]burg MD;  [place]  Service: Vascular;  Laterality: Right;  . Patch angioplasty Right 03/25/2013    Procedure: PATCH ANGIOPLASTY USING VASCU-GUARD PERIPHERAL VASCULAR PATCH;  Surgeon: Conrad Tightwad, MD;  Location: [facility];  Service: Vascular;  Laterality: Right;  . Amputation Right 04/14/2013    Procedure: AMPUTATION BELOW KNEE ;  Surgeon: Conrad Redland, MD;  Location: [place];  Service: Vascular;  Laterality: Right;  . I&d extremity Right 04/14/2013    Procedure: IRRIGATION AND DEBRIDEMENT OF RIGHT  GROIN & PLACEMENT OF VAC DRESSING;  Surgeon: Conrad [place], MD;  Location: [place];  Service: Vascular;  Laterality: Right;  . Abdominal aortagram N/A 04/30/2012    Procedure: ABDOMINAL Maxcine Ham;  Surgeon: Conrad Clarksdale, MD;  Location: [facility] CATH LAB;  Service: Cardiovascular;  Laterality: N/A;  . Lower extremity angiogram Bilateral 04/30/2012    Procedure: LOWER EXTREMITY ANGIOGRAM;  Surgeon: Conrad Brule, MD;  [facility];  Service: Cardiovascular;  Laterality:  Bilateral;  bilat lower extrem angio  . Eye surgery Right     Catarct  . Coronary artery bypass graft  03/2000    quadruple  . Hernia repair      Hiatal Hernia  . Wound debridement Right 04/20/2014    s/p  rt bka  with application of wound vac  . I&d extremity Right 04/20/2014    Procedure: DEBRIDEMENT OF RIGHT BELOW KNEE AMPUTATION STUMP;  Surgeon: Conrad Craighead, MD;  Location: [place];  Service: Vascular;  Laterality: Right;  . Application of wound vac Right 04/20/2014    Procedure: APPLICATION OF WOUND VAC;  Surgeon: Conrad Enhaut, MD;  Location: [place];  Service: Vascular;  Laterality: Right;    There were no vitals filed for this visit.  Visit Diagnosis:  Status post bilateral below knee amputation ([place])  Abnormality of gait  Unsteadiness  Encounter for prosthetic gait training  Weakness generalized  Balance problems      Subjective Assessment - 01/12/15 1400    Subjective He went to [facility] to join [employer] and plans to start going to exercise there.   Currently in Pain? No/denies             Prosthetics Assessment - 01/12/15 1400    Prosthetics   Prosthetic Care Independent with Skin check;Residual limb care;Prosthetic cleaning;Ply sock cleaning;Correct ply sock adjustment;Proper wear schedule/adjustment;Proper weight-bearing schedule/adjustment   K code/activity level with prosthetic use  2                    [facility] Adult PT Treatment/Exercise - 01/12/15 1400    Transfers   Sit to Stand 6: Modified independent (Device/Increase time);With upper extremity assist;With armrests;From chair/3-in-1  from rollator walker seat   Stand to Sit 6: Modified independent (Device/Increase time);With upper extremity assist;With armrests;To chair/3-in-1  to rollator walker seat   Ambulation/Gait   Ambulation/Gait Yes   Ambulation/Gait Assistance 6: Modified independent (Device/Increase time)   Ambulation Distance (Feet) 205 Feet  205', 128' & 40'    Assistive device Rollator;Prostheses   Gait Pattern Step-through pattern;Decreased step length - right;Decreased step length - left;Decreased stride length;Wide base of support;Trunk flexed;Poor foot clearance - left;Decreased hip/knee flexion - right;Decreased hip/knee flexion - left;Poor foot clearance - right  left knee buckling   Ambulation Surface Indoor;Level;Outdoor;Paved   Ramp 6: Modified independent (Device)  rollator walker & prostheses   Curb 6: Modified independent (Device/increase time)  rollator walker & prostheses   Standardized Balance Assessment   Standardized Balance Assessment Berg Balance Test   Berg Balance Test   Sit to Stand Needs minimal aid to stand or to stabilize  Standing Unsupported Able to stand 30 seconds unsupported   Sitting with Back Unsupported but Feet Supported on Floor or Stool Able to sit safely and securely 2 minutes   Stand to Sit Controls descent by using hands   Transfers Able to transfer safely, definite need of hands   Standing Unsupported with Eyes Closed Needs help to keep from falling   Standing Ubsupported with Feet Together Needs help to attain position and unable to hold for 15 seconds   From Standing, Reach Forward with Outstretched Arm Reaches forward but needs supervision   From Standing Position, Pick up Object from Floor Unable to try/needs assist to keep balance   From Standing Position, Turn to Look Behind Over each Shoulder Needs supervision when turning   Turn 360 Degrees Needs assistance while turning   Standing Unsupported, Alternately Place Feet on Step/Stool Needs assistance to keep from falling or unable to try   Standing Unsupported, One Foot in [employer] balance while stepping or standing   Standing on One Leg Unable to try or needs assist to prevent fall   Total Score 15   Prosthetics   Current prosthetic wear tolerance (days/week)  daily   Current prosthetic wear tolerance (#hours/day)  all awake hours out of bed  except bathing   Donning Prosthesis Modified independent (device/increased time)   Doffing Prosthesis Modified independent (device/increased time)                PT Education - 01/12/15 1400    Education provided Yes   Education Details exercise program at [facility] including PT demo proper use of wt machine for UEs   Person(s) Educated Patient   Methods Explanation;Demonstration   Comprehension Verbalized understanding          PT Short Term Goals - 12/15/14 1400    PT SHORT TERM GOAL #1   Title Patient tolerates wear of bilateral prostheses >10 hours total per day without skin issues or tenderness. (Target Date: 12/14/2014)   Baseline MET 12/15/2014 Patient reports wear >10hrs with no issues.   Status Achieved   PT SHORT TERM GOAL #2   Title patient verbalizes signs of sweating in prostheses and proper drying to prevent skin issues. (Target Date: 12/14/2014)   Baseline MET 12/15/2014   Status Achieved   PT SHORT TERM GOAL #3   Title Patient ambulates 250' with rollator walker & prostheses with supervision. (Target Date: 12/14/2014)   Baseline 9/6: pt supervision until fatigued, not able to go 250 feet, needs rest break before. Max distance to date is 220 feet.   Status Partially Met   PT SHORT TERM GOAL #4   Title Patient negotiates ramps & curbs with rollator walker & prostheses with supervision. (Target Date: 12/14/2014)   Baseline MET 12/15/2014   Status Achieved           PT Long Term Goals - 01/12/15 1400    PT LONG TERM GOAL #1   Title Patient tolerates wear of bilateral TTA prostheses >90% of awake hours without skin issues or tenderness to enable function throughout his day. (Target Date: 01/13/2015)   Baseline MET 01/12/2015   Time 2   Period Months   Status Achieved   PT LONG TERM GOAL #2   Title patient verbalizes proper prosthetic care including adjusting ply socks to enable safe prosthetic use.  (Target Date: 01/13/2015)   Baseline MET 01/12/2015   Time 2   Period  Months   Status Achieved   PT LONG TERM  GOAL #3   Title Berg Balance >30/56  (Target Date: 01/13/2015)   Baseline NOT MET Berg Balance 15/56 on February 11, 2015   Time 2   Period Months   Status Not Met   PT LONG TERM GOAL #4   Title Patient ambulates >200' with LRAD & prostheses modified independent to enable community access.  (Target Date: 01/13/2015)   Baseline MET 02/11/2015 with rollator walker & prostheses   Time 2   Period Months   Status Achieved   PT LONG TERM GOAL #5   Title Patient negotiates ramps, curbs & stairs (1 rail) with LRAD & prostheses modified independent for community access.  (Target Date: 01/13/2015)   Baseline MET 2015-02-11 with rollator walker & prostheses on ramps & curbs   Time 2   Period Months   Status Achieved               Plan - 02/11/15 1400    Clinical Impression Statement Patient met 4 of 5 LTGs with rollator walker & prostheses. Patient continues to be limited by strength, endurance, balance. He is planning to exercise at [facility] using his [place] benefits.    PT Next Visit Plan Discharge PT   Consulted and Agree with Plan of Care Patient          G-Codes - 02/11/15 1400    Functional Assessment Tool Used Patient ambulates 92' with rollator walker & prostheses modified independent.    Functional Limitation Mobility: Walking and moving around   Mobility: Walking and Moving Around Goal Status [phone]) At least 20 percent but less than 40 percent impaired, limited or restricted   Mobility: Walking and Moving Around Discharge Status [phone]) At least 20 percent but less than 40 percent impaired, limited or restricted      Problem List Patient Active Problem List   Diagnosis Date Noted  . Microalbuminuria 09/29/2014  . 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 Phantom limb pain ([place]) 08/16/2013  . Pain in limb- Right stump 07/06/2013  . Atherosclerosis of native arteries of the extremities with ulceration ([place])  06/25/2013  . S/P bilateral BKA (below knee amputation) ([place]) 04/21/2013  . Chronic systolic heart failure ([place]) 04/15/2013  . Femoral artery stenosis ([place]) 03/25/2013  . S/P BKA (below knee amputation) bilateral ([place]) 09/10/2012  . SVT (supraventricular tachycardia) ([place]) 09/02/2012  . Hyperlipidemia with target LDL less than 70 01/02/2012  . Type II diabetes mellitus with manifestations, uncontrolled ([place]) 05/03/2010  . Coronary atherosclerosis 04/13/2010  . ATRIAL FIBRILLATION, PAROXYSMAL 04/13/2010    Jamey Reas PT, DPT 02/11/2015, 5:55 PM  [facility] [address] [place], [place], [address] Phone: [phone]   Fax:  [phone]

## 2015-02-01 ENCOUNTER — Other Ambulatory Visit: Payer: Self-pay | Admitting: Internal Medicine

## 2015-03-06 DIAGNOSIS — Z125 Encounter for screening for malignant neoplasm of prostate: Secondary | ICD-10-CM | POA: Diagnosis not present

## 2015-03-06 DIAGNOSIS — N5201 Erectile dysfunction due to arterial insufficiency: Secondary | ICD-10-CM | POA: Diagnosis not present

## 2015-05-22 ENCOUNTER — Emergency Department (HOSPITAL_COMMUNITY): Payer: Commercial Managed Care - HMO

## 2015-05-22 ENCOUNTER — Encounter (HOSPITAL_COMMUNITY): Payer: Self-pay | Admitting: Emergency Medicine

## 2015-05-22 ENCOUNTER — Observation Stay (HOSPITAL_COMMUNITY)
Admission: EM | Admit: 2015-05-22 | Discharge: 2015-05-24 | Disposition: A | Discharge status: Home / Self Care | Payer: Commercial Managed Care - HMO | Attending: Internal Medicine | Admitting: Internal Medicine

## 2015-05-22 DIAGNOSIS — I5022 Chronic systolic (congestive) heart failure: Secondary | ICD-10-CM | POA: Diagnosis not present

## 2015-05-22 DIAGNOSIS — I252 Old myocardial infarction: Secondary | ICD-10-CM | POA: Insufficient documentation

## 2015-05-22 DIAGNOSIS — I255 Ischemic cardiomyopathy: Secondary | ICD-10-CM | POA: Insufficient documentation

## 2015-05-22 DIAGNOSIS — Z87891 Personal history of nicotine dependence: Secondary | ICD-10-CM | POA: Diagnosis not present

## 2015-05-22 DIAGNOSIS — R4781 Slurred speech: Secondary | ICD-10-CM | POA: Insufficient documentation

## 2015-05-22 DIAGNOSIS — E119 Type 2 diabetes mellitus without complications: Secondary | ICD-10-CM | POA: Insufficient documentation

## 2015-05-22 DIAGNOSIS — E785 Hyperlipidemia, unspecified: Secondary | ICD-10-CM | POA: Insufficient documentation

## 2015-05-22 DIAGNOSIS — I2510 Atherosclerotic heart disease of native coronary artery without angina pectoris: Secondary | ICD-10-CM | POA: Diagnosis not present

## 2015-05-22 DIAGNOSIS — E1165 Type 2 diabetes mellitus with hyperglycemia: Secondary | ICD-10-CM | POA: Diagnosis not present

## 2015-05-22 DIAGNOSIS — Z79899 Other long term (current) drug therapy: Secondary | ICD-10-CM | POA: Insufficient documentation

## 2015-05-22 DIAGNOSIS — G459 Transient cerebral ischemic attack, unspecified: Secondary | ICD-10-CM | POA: Diagnosis not present

## 2015-05-22 DIAGNOSIS — I480 Paroxysmal atrial fibrillation: Secondary | ICD-10-CM | POA: Diagnosis not present

## 2015-05-22 DIAGNOSIS — I639 Cerebral infarction, unspecified: Secondary | ICD-10-CM | POA: Diagnosis present

## 2015-05-22 DIAGNOSIS — G451 Carotid artery syndrome (hemispheric): Secondary | ICD-10-CM | POA: Diagnosis not present

## 2015-05-22 DIAGNOSIS — Z7901 Long term (current) use of anticoagulants: Secondary | ICD-10-CM | POA: Insufficient documentation

## 2015-05-22 DIAGNOSIS — E1122 Type 2 diabetes mellitus with diabetic chronic kidney disease: Secondary | ICD-10-CM | POA: Diagnosis not present

## 2015-05-22 DIAGNOSIS — Z89512 Acquired absence of left leg below knee: Secondary | ICD-10-CM | POA: Diagnosis not present

## 2015-05-22 DIAGNOSIS — Z794 Long term (current) use of insulin: Secondary | ICD-10-CM | POA: Insufficient documentation

## 2015-05-22 DIAGNOSIS — Z951 Presence of aortocoronary bypass graft: Secondary | ICD-10-CM | POA: Insufficient documentation

## 2015-05-22 DIAGNOSIS — N189 Chronic kidney disease, unspecified: Secondary | ICD-10-CM | POA: Insufficient documentation

## 2015-05-22 DIAGNOSIS — I63411 Cerebral infarction due to embolism of right middle cerebral artery: Secondary | ICD-10-CM | POA: Diagnosis not present

## 2015-05-22 DIAGNOSIS — I120 Hypertensive chronic kidney disease with stage 5 chronic kidney disease or end stage renal disease: Secondary | ICD-10-CM | POA: Insufficient documentation

## 2015-05-22 DIAGNOSIS — E1140 Type 2 diabetes mellitus with diabetic neuropathy, unspecified: Secondary | ICD-10-CM | POA: Insufficient documentation

## 2015-05-22 DIAGNOSIS — I6789 Other cerebrovascular disease: Secondary | ICD-10-CM | POA: Diagnosis not present

## 2015-05-22 DIAGNOSIS — I739 Peripheral vascular disease, unspecified: Secondary | ICD-10-CM | POA: Insufficient documentation

## 2015-05-22 DIAGNOSIS — R29810 Facial weakness: Secondary | ICD-10-CM | POA: Diagnosis not present

## 2015-05-22 DIAGNOSIS — I25810 Atherosclerosis of coronary artery bypass graft(s) without angina pectoris: Secondary | ICD-10-CM | POA: Insufficient documentation

## 2015-05-22 DIAGNOSIS — E1151 Type 2 diabetes mellitus with diabetic peripheral angiopathy without gangrene: Secondary | ICD-10-CM | POA: Diagnosis not present

## 2015-05-22 LAB — I-STAT TROPONIN, ED: TROPONIN I, POC: 0.22 ng/mL — AB (ref 0.00–0.08)

## 2015-05-22 LAB — CBC
HEMATOCRIT: 45.3 % (ref 39.0–52.0)
HEMOGLOBIN: 15.2 g/dL (ref 13.0–17.0)
MCH: 29.9 pg (ref 26.0–34.0)
MCHC: 33.6 g/dL (ref 30.0–36.0)
MCV: 89 fL (ref 78.0–100.0)
Platelets: 193 10*3/uL (ref 150–400)
RBC: 5.09 MIL/uL (ref 4.22–5.81)
RDW: 12.4 % (ref 11.5–15.5)
WBC: 6.5 10*3/uL (ref 4.0–10.5)

## 2015-05-22 LAB — I-STAT CHEM 8, ED
BUN: 16 mg/dL (ref 6–20)
CALCIUM ION: 1.11 mmol/L — AB (ref 1.13–1.30)
CHLORIDE: 101 mmol/L (ref 101–111)
CREATININE: 1.2 mg/dL (ref 0.61–1.24)
GLUCOSE: 337 mg/dL — AB (ref 65–99)
HCT: 49 % (ref 39.0–52.0)
Hemoglobin: 16.7 g/dL (ref 13.0–17.0)
Potassium: 4.5 mmol/L (ref 3.5–5.1)
Sodium: 137 mmol/L (ref 135–145)
TCO2: 25 mmol/L (ref 0–100)

## 2015-05-22 LAB — COMPREHENSIVE METABOLIC PANEL
ALT: 19 U/L (ref 17–63)
AST: 17 U/L (ref 15–41)
Albumin: 3.1 g/dL — ABNORMAL LOW (ref 3.5–5.0)
Alkaline Phosphatase: 84 U/L (ref 38–126)
Anion gap: 11 (ref 5–15)
BILIRUBIN TOTAL: 0.3 mg/dL (ref 0.3–1.2)
BUN: 14 mg/dL (ref 6–20)
CALCIUM: 9 mg/dL (ref 8.9–10.3)
CO2: 21 mmol/L — ABNORMAL LOW (ref 22–32)
CREATININE: 1.39 mg/dL — AB (ref 0.61–1.24)
Chloride: 104 mmol/L (ref 101–111)
GFR calc Af Amer: 59 mL/min — ABNORMAL LOW (ref >60)
GFR, EST NON AFRICAN AMERICAN: 51 mL/min — AB (ref >60)
Glucose, Bld: 333 mg/dL — ABNORMAL HIGH (ref 65–99)
Potassium: 4.6 mmol/L (ref 3.5–5.1)
Sodium: 136 mmol/L (ref 135–145)
TOTAL PROTEIN: 6.7 g/dL (ref 6.5–8.1)

## 2015-05-22 LAB — DIFFERENTIAL
BASOS ABS: 0 10*3/uL (ref 0.0–0.1)
Basophils Relative: 0 %
Eosinophils Absolute: 0.1 10*3/uL (ref 0.0–0.7)
Eosinophils Relative: 2 %
LYMPHS ABS: 1.8 10*3/uL (ref 0.7–4.0)
Lymphocytes Relative: 28 %
MONO ABS: 0.6 10*3/uL (ref 0.1–1.0)
MONOS PCT: 10 %
NEUTROS ABS: 4 10*3/uL (ref 1.7–7.7)
Neutrophils Relative %: 60 %

## 2015-05-22 LAB — GLUCOSE, CAPILLARY: Glucose-Capillary: 217 mg/dL — ABNORMAL HIGH (ref 65–99)

## 2015-05-22 LAB — PROTIME-INR
INR: 1.01 (ref 0.00–1.49)
Prothrombin Time: 13.5 seconds (ref 11.6–15.2)

## 2015-05-22 LAB — CBG MONITORING, ED: Glucose-Capillary: 299 mg/dL — ABNORMAL HIGH (ref 65–99)

## 2015-05-22 LAB — APTT: APTT: 30 s (ref 24–37)

## 2015-05-22 LAB — ETHANOL: Alcohol, Ethyl (B): <5 mg/dL (ref <5)

## 2015-05-22 MED ORDER — INSULIN ASPART 100 UNIT/ML ~~LOC~~ SOLN: 10.0000 [IU] | Freq: Three times a day (TID) | SUBCUTANEOUS | Status: DC | PRN

## 2015-05-22 MED ORDER — SENNOSIDES-DOCUSATE SODIUM 8.6-50 MG PO TABS: 1.0000 | ORAL_TABLET | Freq: Every evening | ORAL | Status: DC | PRN

## 2015-05-22 MED ORDER — INSULIN GLARGINE 100 UNIT/ML ~~LOC~~ SOLN
50.0000 [IU] | Freq: Two times a day (BID) | SUBCUTANEOUS | Status: DC
  Administered 2015-05-22 – 2015-05-23 (×2): 50 [IU] via SUBCUTANEOUS
  Filled 2015-05-22 (×3): qty 0.5

## 2015-05-22 MED ORDER — STROKE: EARLY STAGES OF RECOVERY BOOK
Freq: Once | Status: AC
  Administered 2015-05-22: 23:00:00
  Filled 2015-05-22 (×2): qty 1

## 2015-05-22 MED ORDER — INSULIN ASPART 100 UNIT/ML ~~LOC~~ SOLN: 10.0000 [IU] | Freq: Three times a day (TID) | SUBCUTANEOUS | Status: DC

## 2015-05-22 MED ORDER — INSULIN ASPART 100 UNIT/ML ~~LOC~~ SOLN
0.0000 [IU] | Freq: Three times a day (TID) | SUBCUTANEOUS | Status: DC
  Administered 2015-05-23: 2 [IU] via SUBCUTANEOUS

## 2015-05-22 MED ORDER — RIVAROXABAN 20 MG PO TABS
20.0000 mg | ORAL_TABLET | Freq: Every day | ORAL | Status: DC
  Administered 2015-05-22 – 2015-05-23 (×2): 20 mg via ORAL
  Filled 2015-05-22 (×3): qty 1

## 2015-05-22 MED ORDER — LISINOPRIL 5 MG PO TABS
5.0000 mg | ORAL_TABLET | Freq: Every day | ORAL | Status: DC
  Administered 2015-05-23 – 2015-05-24 (×2): 5 mg via ORAL
  Filled 2015-05-22 (×2): qty 1

## 2015-05-22 MED ORDER — METOPROLOL SUCCINATE ER 25 MG PO TB24
50.0000 mg | ORAL_TABLET | Freq: Every day | ORAL | Status: DC
  Administered 2015-05-23 – 2015-05-24 (×2): 50 mg via ORAL
  Filled 2015-05-22 (×2): qty 2

## 2015-05-22 MED ORDER — NITROGLYCERIN 0.4 MG SL SUBL: 0.4000 mg | SUBLINGUAL_TABLET | SUBLINGUAL | Status: DC | PRN

## 2015-05-22 NOTE — H&P (Signed)
[facility] History and Physical  Leroy NYGARD MGQ:[id number] DOB: [birth year] DOA: 05/22/2015  Referring physician: Patsy Lager. PCP: [name], MD  Specialists: [name]. Endocrinologist.  Chief Complaint: Difficulty speaking and left-sided facial droop.  HPI: Leroy [name] is a 70 y.o. male with history of paroxysmal atrial fibrillation on metoprolol and xarelto, CAD status post CABG, ischemic cardiomyopathy, diabetes mellitus was brought to the ER after patient's friends noticed that patient was having slurred speech and left facial droop around 2 PM today. Patient otherwise denies any visual symptoms or any weakness of upper or lower extremities. CT of the head did not show anything acute and by the time neurologists evaluated the patient patient's slurred speech improved but still had some left facial droop. Patient has been admitted for further stroke workup. Patient is on xarelto for atrial fibrillation.   Review of Systems: As presented in the history of presenting illness, rest negative.  Past Medical History  Diagnosis Date  . Hyperlipidemia   . Ischemic cardiomyopathy     a. EF 40% 2010. b. Echo (2/14):  mild LVH, EF 30-35%, diff HK, Gr 1 DD, MAC, mild MR, mod LAE, PASP 39  . Cataract   . Hypotension   . Paroxysmal atrial fibrillation (HCC)   . Peripheral vascular disease (HCC)     a. s/p L BKA 2014.  [name] History of blood clots     in legs--this was in 2011--has been off of Coumadin 72months;takes Xarelto daily  . Joint pain   . H/O hiatal hernia   . Chronic kidney disease     on Lisinopril to protect kidneys d/t being on Metformin per pt  . Myocardial infarction (HCC)     total of 8 heart attacks;last one in 2011  . Atrial flutter (HCC)   . H/O medication noncompliance     Due to insurance issues  . Chronic systolic CHF (congestive heart failure) (HCC)   . Coronary artery disease     a. s/p remote CABG 2001 at [facility] b. Cath 03/2010: for med rx.;  c.  [name]  myoview (1/14):  Large Inferior apical MI, very small area of peri-infarct ischemia toward the inferior apical segment. EF 19%.  Med Rx was continued.    . Diabetes mellitus 1979    takes Metformni daily  and Lantus 50units bid  . DVT (deep venous thrombosis) (HCC) 2011    legs  . Peripheral neuropathy (HCC)   . Shortness of breath dyspnea    Past Surgical History  Procedure Laterality Date  . Revasculariztion  2011/2010    x 2   . Tonsillectomy    . Cardiac catheterization  03/19/10  . Abdominal aortagram  04-30-12  . Adenoidectomy    . Amputation Left 06/03/2012    Procedure: AMPUTATION BELOW KNEE;  Surgeon: [name], MD;  Location: [facility] OR;  Service: Vascular;  Laterality: Left;  . Coronary angioplasty  2002    3 stents  . Endarterectomy femoral Right 03/25/2013    Procedure: ENDARTERECTOMY FEMORAL ;  Surgeon: [name], MD;  Location: [facility] OR;  Service: Vascular;  Laterality: Right;  . Patch angioplasty Right 03/25/2013    Procedure: PATCH ANGIOPLASTY USING VASCU-GUARD PERIPHERAL VASCULAR PATCH;  Surgeon: [name], MD;  Location: [facility] OR;  Service: Vascular;  Laterality: Right;  . Amputation Right 04/14/2013    Procedure: AMPUTATION BELOW KNEE ;  Surgeon: [name], MD;  Location: [facility] OR;  Service: Vascular;  Laterality: Right;  .  I&d extremity Right 04/14/2013    Procedure: IRRIGATION AND DEBRIDEMENT OF RIGHT  GROIN & PLACEMENT OF VAC DRESSING;  Surgeon: [name], MD;  Location: [facility] OR;  Service: Vascular;  Laterality: Right;  . Abdominal aortagram N/A 04/30/2012    Procedure: ABDOMINAL Ronny Flurry;  Surgeon: [name], MD;  Location: [facility] CATH LAB;  Service: Cardiovascular;  Laterality: N/A;  . Lower extremity angiogram Bilateral 04/30/2012    Procedure: LOWER EXTREMITY ANGIOGRAM;  Surgeon: [name], MD;  Location: [facility] CATH LAB;  Service: Cardiovascular;  Laterality: Bilateral;  bilat lower extrem angio  . Eye surgery Right     Catarct  . Coronary artery bypass graft   03/2000    quadruple  . Hernia repair      Hiatal Hernia  . Wound debridement Right 04/20/2014    s/p  rt bka  with application of wound vac  . I&d extremity Right 04/20/2014    Procedure: DEBRIDEMENT OF RIGHT BELOW KNEE AMPUTATION STUMP;  Surgeon: [name], MD;  Location: [facility] OR;  Service: Vascular;  Laterality: Right;  . Application of wound vac Right 04/20/2014    Procedure: APPLICATION OF WOUND VAC;  Surgeon: [name], MD;  Location: [facility] OR;  Service: Vascular;  Laterality: Right;   Social History:  reports that he has quit smoking. His smoking use included Cigarettes. He has never used smokeless tobacco. He reports that he does not drink alcohol or use illicit drugs. Where does patient live home. Can patient participate in ADLs? Yes.  Allergies  Allergen Reactions  . Statins     Muscle aches  . Amlodipine Besylate     Muscle aches making difficult to walk  . Cephalexin     REACTION: Hives    Family History:  Family History  Problem Relation Age of Onset  . Heart disease Mother     before age 35  . Diabetes Mother   . Heart attack Mother   . Heart disease Father       Prior to Admission medications   Medication Sig Start Date End Date Taking? Authorizing Provider  Canagliflozin-Metformin HCl (INVOKAMET) 50-1000 MG TABS Take 1 tablet twice a day Patient taking differently: Take 1 tablet by mouth daily.  09/29/14  Yes Reather Littler, MD  insulin aspart (NOVOLOG) 100 UNIT/ML injection Inject 5 Units into the skin 3 (three) times daily as needed for high blood sugar. Patient taking differently: Inject 10 Units into the skin 3 (three) times daily as needed for high blood sugar (over 200).  09/26/14  Yes Etta Grandchild, MD  insulin glargine (LANTUS) 100 UNIT/ML injection Inject 0.45 mLs (45 Units total) into the skin 2 (two) times daily. Patient taking differently: Inject 50 Units into the skin 2 (two) times daily.  09/26/14  Yes Etta Grandchild, MD  lisinopril (PRINIVIL,ZESTRIL)  5 MG tablet Take 1 tablet (5 mg total) by mouth daily. 09/28/14  Yes Etta Grandchild, MD  metoprolol succinate (TOPROL-XL) 50 MG 24 hr tablet Take 1 tablet (50 mg total) by mouth 2 (two) times daily. Take with or immediately following a meal. Patient taking differently: Take 50 mg by mouth daily. Take with or immediately following a meal. 09/28/14  Yes Etta Grandchild, MD  nitroGLYCERIN (NITROSTAT) 0.4 MG SL tablet Place 1 tablet (0.4 mg total) under the tongue every 5 (five) minutes as needed for chest pain. 05/07/13  Yes Daniel J Angiulli, PA-C  ezetimibe (ZETIA) 10 MG tablet Take 1  tablet (10 mg total) by mouth daily. Patient not taking: Reported on 05/22/2015 05/07/13   Mcarthur Rossetti Angiulli, PA-C  Insulin Syringe-Needle U-100 (B-D INS SYR HALF-UNIT .3CC/31G) 31G X 5/16" 0.3 ML MISC Use 3 times a day with insulin vial 10/07/14   Etta Grandchild, MD  metFORMIN (GLUCOPHAGE) 500 MG tablet Take 1 tablet (500 mg total) by mouth 2 (two) times daily with a meal. Patient not taking: Reported on 11/14/2014 09/26/14   Etta Grandchild, MD  rivaroxaban (XARELTO) 20 MG TABS tablet Take 1 tablet (20 mg total) by mouth daily at 6 PM. Patient not taking: Reported on 05/22/2015 09/26/14   Etta Grandchild, MD    Physical Exam: Filed Vitals:   05/22/15 1915 05/22/15 1930 05/22/15 2015 05/22/15 2030  BP: 143/86 128/99 160/89 165/54  Pulse: 81 83 83 82  Temp:      TempSrc:      Resp: 14 22 20 14   Height:      Weight:      SpO2: 96% 96% 94% 98%     General:  Moderately built and nourished.  Eyes: Anicteric no pallor.  ENT: No discharge from the ears eyes nose or mouth.  Neck: No mass felt. No JVD appreciated.  Cardiovascular: S1 and S2 heard.  Respiratory: No rhonchi or crepitations.  Abdomen: Soft nontender bowel sounds present.  Skin: No rash.  Musculoskeletal: Bilateral AKA.  Psychiatric: Appears normal.  Neurologic: Alert awake oriented to time place and person. Moves all extremities 5 x 5. Left facial  droop. PERRLA positive. Tongue is midline.  Labs on Admission:  Basic Metabolic Panel:  Recent Labs Lab 05/22/15 1800 05/22/15 1811  NA 136 137  K 4.6 4.5  CL 104 101  CO2 21*  --   GLUCOSE 333* 337*  BUN 14 16  CREATININE 1.39* 1.20  CALCIUM 9.0  --    Liver Function Tests:  Recent Labs Lab 05/22/15 1800  AST 17  ALT 19  ALKPHOS 84  BILITOT 0.3  PROT 6.7  ALBUMIN 3.1*   No results for input(s): LIPASE, AMYLASE in the last 168 hours. No results for input(s): AMMONIA in the last 168 hours. CBC:  Recent Labs Lab 05/22/15 1800 05/22/15 1811  WBC 6.5  --   NEUTROABS 4.0  --   HGB 15.2 16.7  HCT 45.3 49.0  MCV 89.0  --   PLT 193  --    Cardiac Enzymes: No results for input(s): CKTOTAL, CKMB, CKMBINDEX, TROPONINI in the last 168 hours.  BNP (last 3 results) No results for input(s): BNP in the last 8760 hours.  ProBNP (last 3 results) No results for input(s): PROBNP in the last 8760 hours.  CBG:  Recent Labs Lab 05/22/15 1859  GLUCAP 299*    Radiological Exams on Admission: Ct Head Wo Contrast  05/22/2015  CLINICAL DATA:  Left-sided facial droop with slurred speech onset on known has since resolved EXAM: CT HEAD WITHOUT CONTRAST TECHNIQUE: Contiguous axial images were obtained from the base of the skull through the vertex without intravenous contrast. COMPARISON:  None. FINDINGS: Moderate to severe diffuse cortical atrophy. Tiny chronic lacunar infarcts in both thalamus and in the deep periventricular white matter on the left. No evidence of acute vascular territory infarct. No hemorrhage or extra-axial fluid. Encephalomalacia inferior left cerebellar hemisphere. Calvarium intact with no significant inflammatory change in the visualized portions of the paranasal sinuses. IMPRESSION: Chronic involutional change with chronic lacunar infarcts and encephalomalacia left cerebellar hemisphere. No acute  findings. Electronically Signed   By: Esperanza Heir M.D.   On:  05/22/2015 19:04    EKG: Independently reviewed. Normal sinus rhythm with nonspecific ST changes.  Assessment/Plan Principal Problem:   Stroke (cerebrum) (HCC) Active Problems:   ATRIAL FIBRILLATION, PAROXYSMAL   Chronic systolic heart failure (HCC)   Diabetes mellitus type 2 with peripheral artery disease (HCC)   1. Stroke versus TIA - patient has been placed on neurochecks. Get MRI/MRA brain 2-D echo carotid Doppler. Check lipid panel hemoglobin A1c and get physical therapy consult. Further recommendations per neurologist. Patient is on xarelto. 2. Elevated troponin with history of CAD status post CABG and ischemic cardiomyopathy - denies any chest pain. Cycle cardiac markers. Patient is on xarelto. Check 2-D echo. 3. Paroxysmal atrial fibrillation present in sinus rhythm - chads 2 vasc score is more than 2. Patient is on xarelto and metoprolol. 4. Chronic systolic heart failure appears compensated - closely follow intake output and metabolic panel. 5. Diabetes mellitus type 2 with peripheral vascular disease with hyperglycemia and uncontrolled - patient is on Lantus insulin 50 units twice daily and I have placed patient on sliding scale coverage. Continue his home dose of NovoLog pre-meals. Closely follow CBGs. Holding off oral antidiabetic medications for now. 6. Hypertension - allow for permissive hypertension.   DVT Prophylaxis Lovenox.  Code Status: Full code.  Family Communication: Discussed with patient.  Disposition Plan: Admit to inpatient.    Leroy [name] N. [facility] Pager [phone].  If 7PM-7AM, please contact night-coverage [email or web address] Password [email or web address] 05/22/2015, 9:15 PM

## 2015-05-22 NOTE — ED Notes (Signed)
Called CT currently ready nurse will transport patient to CT.

## 2015-05-22 NOTE — ED Provider Notes (Signed)
CSN: [id number]     Arrival date & time 05/22/15  1743 History   First MD Initiated Contact with Patient 05/22/15 1744     Chief Complaint  Patient presents with  . Facial Droop   HPI Patient presents to the emergency room for evaluation of left sided facial drooping and slurred speech. Patient is not sure exactly when the symptoms started. He says family members told him at 4:00 when they first on that his face was drooping on the left side he was drooling a lot and his speech seemed slurred. Patient says that he saw other people at about 1:30 or 2 PM and they had not noticed anything.  She feels like the symptoms are getting better. Denies any trouble with weakness in his extremities. No headache. No numbness. He does have a history of multiple medical problems including chronic kidney disease, hyperlipidemia, coronary artery disease and diabetes. Past Medical History  Diagnosis Date  . Hyperlipidemia   . Ischemic cardiomyopathy     a. EF 40% 2010. b. Echo (2/14):  mild LVH, EF 30-35%, diff HK, Gr 1 DD, MAC, mild MR, mod LAE, PASP 39  . Cataract   . Hypotension   . Paroxysmal atrial fibrillation (HCC)   . Peripheral vascular disease ([place])     a. s/p L BKA 2014.  [name] History of blood clots     in legs--this was in 2011--has been off of Coumadin 33month;takes Xarelto daily  . Joint pain   . H/O hiatal hernia   . Chronic kidney disease     on Lisinopril to protect kidneys d/t being on Metformin per pt  . Myocardial infarction ([place]     total of 8 heart attacks;last one in 2011  . [place]utter ([place]   . H/O medication noncompliance     Due to insurance issues  . Chronic systolic CHF (congestive heart failure) ([place]   . Coronary artery disease     a. s/p remote CABG 2001 at [facility] b. Cath 03/2010: for med rx.;  c.  [place] Adam[place]oview (1/14):  Large Inferior apical MI, very small area of peri-infarct ischemia toward the inferior apical segment. EF 19%.  Med Rx was continued.    . Diabetes  mellitus 1979    takes Metformni daily  and Lantus 50units bid  . DVT ([place] thrombosis) ([place]    legs  . Peripheral neuropathy ([place]   . Shortness of breath dyspnea    Past Surgical History  Procedure Laterality Date  . Revasculariztion  2011/2010    x 2   . Tonsillectomy    . Cardiac catheterization  03/19/10  . Abdominal aortagram  04-30-12  . Adenoidectomy    . Amputation Left 06/03/2012    Procedure: AMPUTATION BELOW KNEE;  Surgeon: BConrad Trimble MD;  [place] [facility]  Service: Vascular;  Laterality: Left;  . Coronary angioplasty  2002    3 stents  . Endarterectomy femoral Right 03/25/2013    Procedure: ENDARTERECTOMY FEMORAL ;  Surgeon: BConrad Kingsland MD;  [place] [facility]  Service: Vascular;  Laterality: Right;  . Patch angioplasty Right 03/25/2013    Procedure: PATCH ANGIOPLASTY USING VASCU-GUARD PERIPHERAL VASCULAR PATCH;  Surgeon: BConrad [place]boro MD;  [place]  Service: Vascular;  Laterality: Right;  . Amputation Right 04/14/2013    Procedure: AMPUTATION BELOW KNEE ;  Surgeon: BConrad Berrysburg MD;  [place] [facility]  Service: Vascular;  Laterality: Right;  . I&d extremity Right 04/14/2013  Procedure: IRRIGATION AND DEBRIDEMENT OF RIGHT  GROIN & PLACEMENT OF VAC DRESSING;  Surgeon: Conrad Riverton, MD;  Location: [place];  Service: Vascular;  Laterality: Right;  . Abdominal aortagram N/A 04/30/2012    Procedure: ABDOMINAL Maxcine Ham;  Surgeon: Conrad Port O'Connor, MD;  Location: [facility] CATH LAB;  Service: Cardiovascular;  Laterality: N/A;  . Lower extremity angiogram Bilateral 04/30/2012    Procedure: LOWER EXTREMITY ANGIOGRAM;  Surgeon: Conrad Lockhart, MD;  Location: [facility] CATH LAB;  Service: Cardiovascular;  Laterality: Bilateral;  bilat lower extrem angio  . Eye surgery Right     Catarct  . Coronary artery bypass graft  03/2000    quadruple  . Hernia repair      Hiatal Hernia  . Wound debridement Right 04/20/2014    s/p  rt bka  with application of wound vac  . I&d extremity Right  04/20/2014    Procedure: DEBRIDEMENT OF RIGHT BELOW KNEE AMPUTATION STUMP;  Surgeon: Conrad Deshler, MD;  Location: [facility] OR;  Service: Vascular;  Laterality: Right;  . Application of wound vac Right 04/20/2014    Procedure: APPLICATION OF WOUND VAC;  Surgeon: Conrad , MD;  Location: [place];  Service: Vascular;  Laterality: Right;   Family History  Problem Relation Age of Onset  . Heart disease Mother     before age 29  . Diabetes Mother   . Heart attack Mother   . Heart disease Father    Social History  Substance Use Topics  . Smoking status: Former Smoker    Types: Cigarettes  . Smokeless tobacco: Never Used     Comment: quit smoking 55yr ago  . Alcohol Use: No     Comment: occasionally    Review of Systems  All other systems reviewed and are negative.     Allergies  Statins; Amlodipine besylate; and Cephalexin  Home Medications   Prior to Admission medications   Medication Sig Start Date End Date Taking? Authorizing Provider  Alcohol Swabs (B-D SINGLE USE SWABS REGULAR) PADS  07/22/13   Historical Provider, MD  B-D UF III MINI PEN NEEDLES 31G X 5 MM MISC USED TO INJECT INSULIN AS DIRECTED BY YOUR PHYSICIAN 02/01/15   TJanith Lima MD  Blood Glucose Calibration (ACCU-CHEK SMARTVIEW CONTROL) LIQD Test up to TID dx: 250.62 09/29/13   TJanith Lima MD  Blood Glucose Monitoring Suppl (ACCU-CHEK NANO SMARTVIEW) W/DEVICE KIT Test up to TID dx: 250.62 09/29/13   TJanith Lima MD  Canagliflozin-Metformin HCl (INVOKAMET) 50-1000 MG TABS Take 1 tablet twice a day 09/29/14   AElayne Snare MD  ezetimibe (ZETIA) 10 MG tablet Take 1 tablet (10 mg total) by mouth daily. 05/07/13   DLavon PaganiniAngiulli, PA-C  glucose blood (ACCU-CHEK SMARTVIEW) test strip Test up to TID dx: 250.62 09/29/13   TJanith Lima MD  insulin aspart (NOVOLOG) 100 UNIT/ML injection Inject 5 Units into the skin 3 (three) times daily as needed for high blood sugar. 09/26/14   TJanith Lima MD  insulin glargine (LANTUS)  100 UNIT/ML injection Inject 0.45 mLs (45 Units total) into the skin 2 (two) times daily. 09/26/14   TJanith Lima MD  Insulin Syringe-Needle U-100 (B-D INS SYR HALF-UNIT .3CC/31G) 31G X 5/16" 0.3 ML MISC Use 3 times a day with insulin vial 10/07/14   TJanith Lima MD  Lancets Misc. (ACCU-CHEK FASTCLIX LANCET) KIT Test up to TID dx: 250.62 09/29/13   TJanith Lima MD  lisinopril (  PRINIVIL,ZESTRIL) 5 MG tablet Take 1 tablet (5 mg total) by mouth daily. 09/28/14   Janith Lima, MD  metFORMIN (GLUCOPHAGE) 500 MG tablet Take 1 tablet (500 mg total) by mouth 2 (two) times daily with a meal. Patient not taking: Reported on 11/14/2014 09/26/14   Janith Lima, MD  metoprolol succinate (TOPROL-XL) 50 MG 24 hr tablet Take 1 tablet (50 mg total) by mouth 2 (two) times daily. Take with or immediately following a meal. 09/28/14   Janith Lima, MD  nitroGLYCERIN (NITROSTAT) 0.4 MG SL tablet Place 1 tablet (0.4 mg total) under the tongue every 5 (five) minutes as needed for chest pain. 05/07/13   Lavon Paganini Angiulli, PA-C  rivaroxaban (XARELTO) 20 MG TABS tablet Take 1 tablet (20 mg total) by mouth daily at 6 PM. 09/26/14   Janith Lima, MD   BP 108/78 mmHg  Pulse 83  Temp(Src) 98.2 F (36.8 C) (Oral)  Resp 16  Ht 5' 9"  (1.753 m)  Wt 136.079 kg  BMI 44.28 kg/m2  SpO2 95% Physical Exam  Constitutional: He is oriented to person, place, and time. He appears well-developed and well-nourished. No distress.  HENT:  Head: Normocephalic and atraumatic.  Right Ear: External ear normal.  Left Ear: External ear normal.  Mouth/Throat: Oropharynx is clear and moist.  Eyes: Conjunctivae are normal. Right eye exhibits no discharge. Left eye exhibits no discharge. No scleral icterus.  Neck: Neck supple. No tracheal deviation present.  Cardiovascular: Normal rate, regular rhythm and intact distal pulses.   Pulmonary/Chest: Effort normal and breath sounds normal. No stridor. No respiratory distress. He has no wheezes.  He has no rales.  Abdominal: Soft. Bowel sounds are normal. He exhibits no distension. There is no tenderness. There is no rebound and no guarding.  Musculoskeletal: He exhibits no edema or tenderness.  Neurological: He is alert and oriented to person, place, and time. He has normal strength. No cranial nerve deficit (left facial droop, extraocular movements intact, tongue midline  ) or sensory deficit. He exhibits normal muscle tone. He displays no seizure activity. Coordination normal.  No pronator drift bilateral upper extrem, able to hold both legs off bed for 5 seconds, sensation intact in all extremities, no visual field cuts, no left or right sided neglect, normal finger-nose exam bilaterally, no nystagmus noted, no dysarthria or aphasia, able to name objects and read sentences   Skin: Skin is warm and dry. No rash noted.  Psychiatric: He has a normal mood and affect.  Nursing note and vitals reviewed.   ED Course  Procedures (including critical care time) Labs Review Labs Reviewed  COMPREHENSIVE METABOLIC PANEL - Abnormal; Notable for the following:    CO2 21 (*)    Glucose, Bld 333 (*)    Creatinine, Ser 1.39 (*)    Albumin 3.1 (*)    GFR calc non Af Amer 51 (*)    GFR calc Af Amer 59 (*)    All other components within normal limits  I-STAT TROPOININ, ED - Abnormal; Notable for the following:    Troponin i, poc 0.22 (*)    All other components within normal limits  CBG MONITORING, ED - Abnormal; Notable for the following:    Glucose-Capillary 299 (*)    All other components within normal limits  I-STAT CHEM 8, ED - Abnormal; Notable for the following:    Glucose, Bld 337 (*)    Calcium, Ion 1.11 (*)    All other components within normal  limits  PROTIME-INR  APTT  CBC  DIFFERENTIAL  ETHANOL  URINE RAPID DRUG SCREEN, HOSP PERFORMED  URINALYSIS, ROUTINE W REFLEX MICROSCOPIC (NOT AT [facility])  I-STAT CHEM 8, ED  I-STAT TROPOININ, ED    Imaging Review Ct Head Wo  Contrast  05/22/2015  CLINICAL DATA:  Left-sided facial droop with slurred speech onset on known has since resolved EXAM: CT HEAD WITHOUT CONTRAST TECHNIQUE: Contiguous axial images were obtained from the base of the skull through the vertex without intravenous contrast. COMPARISON:  None. FINDINGS: Moderate to severe diffuse cortical atrophy. Tiny chronic lacunar infarcts in both thalamus and in the deep periventricular white matter on the left. No evidence of acute vascular territory infarct. No hemorrhage or extra-axial fluid. Encephalomalacia inferior left cerebellar hemisphere. Calvarium intact with no significant inflammatory change in the visualized portions of the paranasal sinuses. IMPRESSION: Chronic involutional change with chronic lacunar infarcts and encephalomalacia left cerebellar hemisphere. No acute findings. Electronically Signed   By: Skipper Cliche M.D.   On: 05/22/2015 19:04   I have personally reviewed and evaluated these images and lab results as part of my medical decision-making.   EKG Interpretation   Date/Time:  Monday May 22 2015 17:43:32 EST Ventricular Rate:  89 PR Interval:  148 QRS Duration: 124 QT Interval:  360 QTC Calculation: 438 R Axis:   75 Text Interpretation:  Sinus rhythm Nonspecific intraventricular conduction  delay Repol abnrm suggests ischemia, diffuse leads Minimal ST elevation,  lateral leads No significant change since last tracing Confirmed by Olden Klauer   MD-J, Lynnet Hefley ([id number]) on 05/22/2015 5:56:34 PM      MDM   Final diagnoses:  Facial droop    Patient was evaluated by neurology. Patient is not a TPA candidate. The exact timing of his symptoms is somewhat unclear. The symptoms are also improving significantly and overall he has very mild findings at this point.  I will order an MRI of the brain and plan on admission to the hospital for further evaluation and treatment.    Dorie Rank, MD 05/22/15 [place]

## 2015-05-22 NOTE — Consult Note (Signed)
Requesting Physician: Dr.  Glendon Axe    Reason for consultation: evaluate left facial droop, slurred speech  HPI:                                                                                                                                         Leroy [name] is an 70 y.o. male patient who presented  to the ER for further evaluation of slurred speech symptoms which started earlier in the afternoon at around 2 PM. Patient is a poor historian. He lives alone and he denies any knowledge of symptoms this morning. At around 2 PM when he was talking to some people they noticed slurred speech and left facial droop and hence was sent to the ER for further evaluation. He has a poorly controlled diabetes, bilateral above-knee amputations . Denies any new vision problems or any new sensorimotor symptoms in the limbs. Speech symptoms have resolved at this time. Patient does not have any knowledge of facial droop .  Patient has history of atrial fibrillation, on anticoagulation with Xarelto, his last dose at 6 PM last night  Date last known well:  05/22/15 Time last known well:  Possible 1400, patient is a poor historian tPA Given: No: Symptoms resolved , outside tPA time window , on anticoagulation with Xarelto  Stroke Risk Factors - atrial fibrillation, diabetes mellitus, hyperlipidemia and hypertension  Past Medical History: Past Medical History  Diagnosis Date  . Hyperlipidemia   . Ischemic cardiomyopathy     a. EF 40% 2010. b. Echo (2/14):  mild LVH, EF 30-35%, diff HK, Gr 1 DD, MAC, mild MR, mod LAE, PASP 39  . Cataract   . Hypotension   . Paroxysmal atrial fibrillation (HCC)   . Peripheral vascular disease ([place])     a. s/p L BKA 2014.  [name] History of blood clots     in legs--this was in 2011--has been off of Coumadin 56month;takes Xarelto daily  . Joint pain   . H/O hiatal hernia   . Chronic kidney disease     on Lisinopril to protect kidneys d/t being on Metformin per pt  . Myocardial  infarction ([place]     total of 8 heart attacks;last one in 2011  . [place]rial flutter ([place]   . H/O medication noncompliance     Due to insurance issues  . Chronic systolic CHF (congestive heart failure) ([place]   . Coronary artery disease     a. s/p remote CABG 2001 at [facility] b. Cath 03/2010: for med rx.;  c.  [facility] (1/14):  Large Inferior apical MI, very small area of peri-infarct ischemia toward the inferior apical segment. EF 19%.  Med Rx was continued.    . Diabetes mellitus 1979    takes Metformni daily  and Lantus 50units bid  . DVT (deep venous thrombosis) ([place] 2011    legs  . Peripheral neuropathy ([place]   .  Shortness of breath dyspnea     Past Surgical History  Procedure Laterality Date  . Revasculariztion  2011/2010    x 2   . Tonsillectomy    . Cardiac catheterization  03/19/10  . Abdominal aortagram  04-30-12  . Adenoidectomy    . Amputation Left 06/03/2012    Procedure: AMPUTATION BELOW KNEE;  Surgeon: Conrad Ramsey, MD;  Location: [place];  Service: Vascular;  Laterality: Left;  . Coronary angioplasty  2002    3 stents  . Endarterectomy femoral Right 03/25/2013    Procedure: ENDARTERECTOMY FEMORAL ;  Surgeon: Conrad Los Llanos, MD;  Location: [place];  Service: Vascular;  Laterality: Right;  . Patch angioplasty Right 03/25/2013    Procedure: PATCH ANGIOPLASTY USING VASCU-GUARD PERIPHERAL VASCULAR PATCH;  Surgeon: Conrad Luling, MD;  Location: [place];  Service: Vascular;  Laterality: Right;  . Amputation Right 04/14/2013    Procedure: AMPUTATION BELOW KNEE ;  Surgeon: Conrad Yaak, MD;  Location: [place];  Service: Vascular;  Laterality: Right;  . I&d extremity Right 04/14/2013    Procedure: IRRIGATION AND DEBRIDEMENT OF RIGHT  GROIN & PLACEMENT OF VAC DRESSING;  Surgeon: Conrad Ojus, MD;  Location: [place];  Service: Vascular;  Laterality: Right;  . Abdominal aortagram N/A 04/30/2012    Procedure: ABDOMINAL Maxcine Ham;  Surgeon: Conrad [place], MD;  Location: [facility] CATH LAB;  Service:  Cardiovascular;  Laterality: N/A;  . Lower extremity angiogram Bilateral 04/30/2012    Procedure: LOWER EXTREMITY ANGIOGRAM;  Surgeon: Conrad [place], MD;  Location: [facility] CATH LAB;  Service: Cardiovascular;  Laterality: Bilateral;  bilat lower extrem angio  . Eye surgery Right     Catarct  . Coronary artery bypass graft  03/2000    quadruple  . Hernia repair      Hiatal Hernia  . Wound debridement Right 04/20/2014    s/p  rt bka  with application of wound vac  . I&d extremity Right 04/20/2014    Procedure: DEBRIDEMENT OF RIGHT BELOW KNEE AMPUTATION STUMP;  Surgeon: Conrad Sombrillo, MD;  Location[facility];  Service: Vascular;  Laterality: Right;  . Application of wound vac Right 04/20/2014    Procedure: APPLICATION OF WOUND VAC;  Surgeon: Conrad Livingston, MD;  Location: [place];  Service: Vascular;  Laterality: Right;    Family History: Family History  Problem Relation Age of Onset  . Heart disease Mother     before age 43  . Diabetes Mother   . Heart attack Mother   . Heart disease Father     Social History:   reports that he has quit smoking. His smoking use included Cigarettes. He has never used smokeless tobacco. He reports that he does not drink alcohol or use illicit drugs.  Allergies:  Allergies  Allergen Reactions  . Statins     Muscle aches  . Amlodipine Besylate     Muscle aches making difficult to walk  . Cephalexin     REACTION: Hives     Medications:  No current facility-administered medications for this encounter.  Current outpatient prescriptions:  .  Alcohol Swabs (B-D SINGLE USE SWABS REGULAR) PADS, , Disp: , Rfl:  .  B-D UF III MINI PEN NEEDLES 31G X 5 MM MISC, USED TO INJECT INSULIN AS DIRECTED BY YOUR PHYSICIAN, Disp: 270 each, Rfl: 3 .  Blood Glucose Calibration (ACCU-CHEK SMARTVIEW CONTROL) LIQD, Test up to TID dx: 250.62, Disp: 1 each, Rfl: 3 .   Blood Glucose Monitoring Suppl (ACCU-CHEK NANO SMARTVIEW) W/DEVICE KIT, Test up to TID dx: 250.62, Disp: 1 kit, Rfl: 1 .  Canagliflozin-Metformin HCl (INVOKAMET) 50-1000 MG TABS, Take 1 tablet twice a day, Disp: 180 tablet, Rfl: 1 .  ezetimibe (ZETIA) 10 MG tablet, Take 1 tablet (10 mg total) by mouth daily., Disp: 30 tablet, Rfl: 1 .  glucose blood (ACCU-CHEK SMARTVIEW) test strip, Test up to TID dx: 250.62, Disp: 300 each, Rfl: 4 .  insulin aspart (NOVOLOG) 100 UNIT/ML injection, Inject 5 Units into the skin 3 (three) times daily as needed for high blood sugar., Disp: 10 mL, Rfl: 11 .  insulin glargine (LANTUS) 100 UNIT/ML injection, Inject 0.45 mLs (45 Units total) into the skin 2 (two) times daily., Disp: 10 mL, Rfl: 11 .  Insulin Syringe-Needle U-100 (B-D INS SYR HALF-UNIT .3CC/31G) 31G X 5/16" 0.3 ML MISC, Use 3 times a day with insulin vial, Disp: 300 each, Rfl: 3 .  Lancets Misc. (ACCU-CHEK FASTCLIX LANCET) KIT, Test up to TID dx: 250.62, Disp: 1 kit, Rfl: 3 .  lisinopril (PRINIVIL,ZESTRIL) 5 MG tablet, Take 1 tablet (5 mg total) by mouth daily., Disp: 90 tablet, Rfl: 3 .  metFORMIN (GLUCOPHAGE) 500 MG tablet, Take 1 tablet (500 mg total) by mouth 2 (two) times daily with a meal. (Patient not taking: Reported on 11/14/2014), Disp: 60 tablet, Rfl: 11 .  metoprolol succinate (TOPROL-XL) 50 MG 24 hr tablet, Take 1 tablet (50 mg total) by mouth 2 (two) times daily. Take with or immediately following a meal., Disp: 180 tablet, Rfl: 3 .  nitroGLYCERIN (NITROSTAT) 0.4 MG SL tablet, Place 1 tablet (0.4 mg total) under the tongue every 5 (five) minutes as needed for chest pain., Disp: 30 tablet, Rfl: 12 .  rivaroxaban (XARELTO) 20 MG TABS tablet, Take 1 tablet (20 mg total) by mouth daily at 6 PM., Disp: 30 tablet, Rfl: 5   ROS:                                                                                                                                       History obtained from the  patient  General ROS: negative for - chills, fatigue, fever, night sweats, weight gain or weight loss Psychological ROS: negative for - behavioral disorder, hallucinations, memory difficulties, mood swings or suicidal ideation Ophthalmic ROS: negative for - blurry vision, double vision, eye pain or loss of vision ENT ROS: negative for - epistaxis, nasal discharge,  oral lesions, sore throat, tinnitus or vertigo Allergy and Immunology ROS: negative for - hives or itchy/watery eyes Hematological and Lymphatic ROS: negative for - bleeding problems, bruising or swollen lymph nodes Endocrine ROS: negative for - galactorrhea, hair pattern changes, polydipsia/polyuria or temperature intolerance Respiratory ROS: negative for - cough, hemoptysis, shortness of breath or wheezing Cardiovascular ROS: negative for - chest pain, dyspnea on exertion, edema or irregular heartbeat Gastrointestinal ROS: negative for - abdominal pain, diarrhea, hematemesis, nausea/vomiting or stool incontinence Genito-Urinary ROS: negative for - dysuria, hematuria, incontinence or urinary frequency/urgency Musculoskeletal ROS: negative for - joint swelling or muscular weakness Neurological ROS: as noted in HPI Dermatological ROS: negative for rash and skin lesion changes  Neurologic Examination:                                                                                                      Blood pressure 152/49, pulse 91, temperature 98.2 F (36.8 C), temperature source Oral, resp. rate 15, height 5' 9"  (1.753 m), weight 136.079 kg (300 lb), SpO2 93 %.  Evaluation of higher integrative functions including: Level of alertness: Alert,  Oriented to time, place and person peech: fluent, no evidence of dysarthria or aphasia noted.  Test the following cranial nerves: 2-12 grossly intact, except for minimal flattening of the left nasolabial fold compared to right side noted. Otherwise no significant left facial weakness or droop  noted.  Motor examination: Normal tone, bulk, full 5/5 motor strength in bilateral upper extremities, s/p bilateral above-knee amputations. Full strength with hip flexion  bilaterally Examination of sensation : symmetric sensation to pinprick proximally  in all 4 extremities , bilateral above-knee amputations with prosthetic limbs  Examination of deep tendon reflexes: 1+, in  bilateral upper extremities  Test coordination: Normal finger nose testing, with no evidence of limb appendicular ataxia or abnormal involuntary movements or tremors noted.  Gait: Deferred   Lab Results: Basic Metabolic Panel:  Recent Labs Lab 05/22/15 1800 05/22/15 1811  NA 136 137  K 4.6 4.5  CL 104 101  CO2 21*  --   GLUCOSE 333* 337*  BUN 14 16  CREATININE 1.39* 1.20  CALCIUM 9.0  --     Liver Function Tests:  Recent Labs Lab 05/22/15 1800  AST 17  ALT 19  ALKPHOS 84  BILITOT 0.3  PROT 6.7  ALBUMIN 3.1*   No results for input(s): LIPASE, AMYLASE in the last 168 hours. No results for input(s): AMMONIA in the last 168 hours.  CBC:  Recent Labs Lab 05/22/15 1800 05/22/15 1811  WBC 6.5  --   NEUTROABS 4.0  --   HGB 15.2 16.7  HCT 45.3 49.0  MCV 89.0  --   PLT 193  --     Cardiac Enzymes: No results for input(s): CKTOTAL, CKMB, CKMBINDEX, TROPONINI in the last 168 hours.  Lipid Panel: No results for input(s): CHOL, TRIG, HDL, CHOLHDL, VLDL, LDLCALC in the last 168 hours.  CBG: No results for input(s): GLUCAP in the last 168 hours.  Microbiology: Results for orders placed or performed during the hospital  encounter of 04/12/13  Culture, blood (routine x 2)     Status: None   Collection Time: 04/13/13  9:35 PM  Result Value Ref Range Status   Specimen Description BLOOD LEFT ARM  Final   Special Requests BOTTLES DRAWN AEROBIC ONLY 10CC  Final   Culture  Setup Time   Final    04/14/2013 01:48 Performed at [facility]   Final    NO GROWTH 5 DAYS Performed at  [facility]   Report Status 04/20/2013 FINAL  Final  Culture, blood (routine x 2)     Status: None   Collection Time: 04/13/13  9:40 PM  Result Value Ref Range Status   Specimen Description BLOOD LEFT HAND  Final   Special Requests BOTTLES DRAWN AEROBIC ONLY 4CC  Final   Culture  Setup Time   Final    04/14/2013 01:50 Performed at [facility]   Culture   Final    NO GROWTH 5 DAYS Performed at [facility]   Report Status 04/20/2013 FINAL  Final  MRSA PCR Screening     Status: None   Collection Time: 04/14/13  1:39 PM  Result Value Ref Range Status   MRSA by PCR NEGATIVE NEGATIVE Final    Comment:        The GeneXpert MRSA Assay (FDA approved for NASAL specimens only), is one component of a comprehensive MRSA colonization surveillance program. It is not intended to diagnose MRSA infection nor to guide or monitor treatment for MRSA infections.  Urine culture     Status: None   Collection Time: 04/16/13  5:18 PM  Result Value Ref Range Status   Specimen Description URINE, RANDOM  Final   Special Requests Normal  Final   Culture  Setup Time   Final    04/17/2013 00:29 Performed at [facility] Performed at [facility]  Final   Culture NO GROWTH Performed at [facility]  Final   Report Status 04/18/2013 FINAL  Final     Imaging: CT of the head showed no acute pathology .   Assessment and plan:   Leroy [name] is an 70 y.o. male patient  with significant cerebrovascular risk factors including atrial fibrillation, poorly controlled type 2 diabetes, hypertension , and requires an alternative .  he presented to the ER with transient symptoms of slurred speech and left facial droop noted at about 2 PM today. The slurred speech symptoms have resolved at this time. Minimal flattening of the left nasolabial fold noted. Could be a small right MCA embolic stroke given the history of atrial fibrillation. He  was outside the time window for any thrombotic therapy at the time of evaluation.  moreover he is on anticoagulation with Xarelto with the last dose taken about 24 hours ago. Recommend further neurodiagnostic workup with an MRI of the brain, and MRA of the head, ultrasound of the carotids and echocardiogram to rule out cardiac thrombus. Continue patient's home medications including Xarelto. Glucose monitoring and good glycemic control, check A1c and lipid profile.  Stroke team will continue to follow starting tomorrow morning. Please call for any further questions.

## 2015-05-22 NOTE — ED Notes (Signed)
Sedation needed for mri.

## 2015-05-22 NOTE — ED Notes (Signed)
Attempted report again. Requested to call directly in a few minutes at [phone] to speak to [facility].

## 2015-05-22 NOTE — ED Notes (Signed)
Patient speaking with mri in regards to equipment.

## 2015-05-22 NOTE — ED Notes (Signed)
Arrived via EMS patient drove to family members house who noticed patient having left sided facial and slurred speech. EMS arrived stated symptoms intermittent. Unknown last seen normal. Patient arrived by family members house [phone]. Upon arrival patient has no complaints no facial droop present and has clear speech.

## 2015-05-22 NOTE — ED Notes (Signed)
Patient undressed, in gown, on monitor, continuous pulse oximetry and blood pressure cuff 

## 2015-05-22 NOTE — ED Notes (Signed)
Doctor at bedside.

## 2015-05-23 ENCOUNTER — Inpatient Hospital Stay (HOSPITAL_BASED_OUTPATIENT_CLINIC_OR_DEPARTMENT_OTHER): Payer: Commercial Managed Care - HMO

## 2015-05-23 ENCOUNTER — Inpatient Hospital Stay (HOSPITAL_COMMUNITY): Payer: Commercial Managed Care - HMO

## 2015-05-23 DIAGNOSIS — I25709 Atherosclerosis of coronary artery bypass graft(s), unspecified, with unspecified angina pectoris: Secondary | ICD-10-CM

## 2015-05-23 DIAGNOSIS — E1151 Type 2 diabetes mellitus with diabetic peripheral angiopathy without gangrene: Secondary | ICD-10-CM | POA: Diagnosis not present

## 2015-05-23 DIAGNOSIS — G451 Carotid artery syndrome (hemispheric): Secondary | ICD-10-CM | POA: Insufficient documentation

## 2015-05-23 DIAGNOSIS — I480 Paroxysmal atrial fibrillation: Secondary | ICD-10-CM

## 2015-05-23 DIAGNOSIS — R4781 Slurred speech: Secondary | ICD-10-CM | POA: Diagnosis not present

## 2015-05-23 DIAGNOSIS — Z89511 Acquired absence of right leg below knee: Secondary | ICD-10-CM

## 2015-05-23 DIAGNOSIS — I63411 Cerebral infarction due to embolism of right middle cerebral artery: Secondary | ICD-10-CM

## 2015-05-23 DIAGNOSIS — G459 Transient cerebral ischemic attack, unspecified: Secondary | ICD-10-CM | POA: Diagnosis not present

## 2015-05-23 DIAGNOSIS — I5022 Chronic systolic (congestive) heart failure: Secondary | ICD-10-CM | POA: Diagnosis not present

## 2015-05-23 DIAGNOSIS — I6789 Other cerebrovascular disease: Secondary | ICD-10-CM | POA: Diagnosis not present

## 2015-05-23 DIAGNOSIS — Z89512 Acquired absence of left leg below knee: Secondary | ICD-10-CM

## 2015-05-23 DIAGNOSIS — I739 Peripheral vascular disease, unspecified: Secondary | ICD-10-CM | POA: Insufficient documentation

## 2015-05-23 DIAGNOSIS — I25810 Atherosclerosis of coronary artery bypass graft(s) without angina pectoris: Secondary | ICD-10-CM | POA: Insufficient documentation

## 2015-05-23 LAB — COMPREHENSIVE METABOLIC PANEL
ALBUMIN: 2.6 g/dL — AB (ref 3.5–5.0)
ALT: 16 U/L — ABNORMAL LOW (ref 17–63)
ANION GAP: 14 (ref 5–15)
AST: 16 U/L (ref 15–41)
Alkaline Phosphatase: 78 U/L (ref 38–126)
BILIRUBIN TOTAL: 0.4 mg/dL (ref 0.3–1.2)
BUN: 11 mg/dL (ref 6–20)
CO2: 22 mmol/L (ref 22–32)
Calcium: 9.1 mg/dL (ref 8.9–10.3)
Chloride: 103 mmol/L (ref 101–111)
Creatinine, Ser: 1.23 mg/dL (ref 0.61–1.24)
GFR calc Af Amer: >60 mL/min (ref >60)
GFR calc non Af Amer: 59 mL/min — ABNORMAL LOW (ref >60)
GLUCOSE: 246 mg/dL — AB (ref 65–99)
POTASSIUM: 4.4 mmol/L (ref 3.5–5.1)
SODIUM: 139 mmol/L (ref 135–145)
TOTAL PROTEIN: 6.2 g/dL — AB (ref 6.5–8.1)

## 2015-05-23 LAB — CBC
HEMATOCRIT: 44.7 % (ref 39.0–52.0)
HEMOGLOBIN: 15.2 g/dL (ref 13.0–17.0)
MCH: 29.6 pg (ref 26.0–34.0)
MCHC: 34 g/dL (ref 30.0–36.0)
MCV: 87 fL (ref 78.0–100.0)
Platelets: 162 10*3/uL (ref 150–400)
RBC: 5.14 MIL/uL (ref 4.22–5.81)
RDW: 12.4 % (ref 11.5–15.5)
WBC: 6.1 10*3/uL (ref 4.0–10.5)

## 2015-05-23 LAB — LIPID PANEL
CHOL/HDL RATIO: 4.6 ratio
Cholesterol: 209 mg/dL — ABNORMAL HIGH (ref 0–200)
HDL: 45 mg/dL (ref >40)
LDL CALC: 142 mg/dL — AB (ref 0–99)
TRIGLYCERIDES: 108 mg/dL (ref <150)
VLDL: 22 mg/dL (ref 0–40)

## 2015-05-23 LAB — GLUCOSE, CAPILLARY
GLUCOSE-CAPILLARY: 185 mg/dL — AB (ref 65–99)
GLUCOSE-CAPILLARY: 111 mg/dL — AB (ref 65–99)
GLUCOSE-CAPILLARY: 107 mg/dL — AB (ref 65–99)
Glucose-Capillary: 160 mg/dL — ABNORMAL HIGH (ref 65–99)

## 2015-05-23 LAB — TROPONIN I
Troponin I: 0.21 ng/mL — ABNORMAL HIGH (ref <0.031)
Troponin I: 0.18 ng/mL — ABNORMAL HIGH (ref <0.031)
Troponin I: 0.15 ng/mL — ABNORMAL HIGH (ref <0.031)

## 2015-05-23 MED ORDER — INSULIN ASPART 100 UNIT/ML ~~LOC~~ SOLN
3.0000 [IU] | Freq: Three times a day (TID) | SUBCUTANEOUS | Status: DC
  Administered 2015-05-23 – 2015-05-24 (×3): 3 [IU] via SUBCUTANEOUS

## 2015-05-23 MED ORDER — PERFLUTREN LIPID MICROSPHERE
INTRAVENOUS | Status: AC
  Filled 2015-05-23: qty 10

## 2015-05-23 MED ORDER — INSULIN DETEMIR 100 UNIT/ML ~~LOC~~ SOLN
15.0000 [IU] | Freq: Every day | SUBCUTANEOUS | Status: DC
  Filled 2015-05-23: qty 0.15

## 2015-05-23 MED ORDER — INSULIN DETEMIR 100 UNIT/ML ~~LOC~~ SOLN
20.0000 [IU] | Freq: Every day | SUBCUTANEOUS | Status: DC
Start: 2015-05-23 — End: 2015-05-24
  Administered 2015-05-23: 20 [IU] via SUBCUTANEOUS
  Filled 2015-05-23 (×3): qty 0.2

## 2015-05-23 MED ORDER — ALPRAZOLAM 0.5 MG PO TABS
0.5000 mg | ORAL_TABLET | Freq: Once | ORAL | Status: AC
  Administered 2015-05-23: 0.5 mg via ORAL
  Filled 2015-05-23: qty 1

## 2015-05-23 MED ORDER — ROSUVASTATIN CALCIUM 20 MG PO TABS
20.0000 mg | ORAL_TABLET | Freq: Every day | ORAL | Status: DC
  Administered 2015-05-23: 20 mg via ORAL
  Filled 2015-05-23 (×2): qty 1

## 2015-05-23 MED ORDER — INSULIN ASPART 100 UNIT/ML ~~LOC~~ SOLN
0.0000 [IU] | Freq: Three times a day (TID) | SUBCUTANEOUS | Status: DC
  Administered 2015-05-23: 2 [IU] via SUBCUTANEOUS

## 2015-05-23 MED ORDER — ASPIRIN EC 81 MG PO TBEC
81.0000 mg | DELAYED_RELEASE_TABLET | Freq: Every day | ORAL | Status: DC
  Administered 2015-05-23 – 2015-05-24 (×2): 81 mg via ORAL
  Filled 2015-05-23 (×2): qty 1

## 2015-05-23 MED ORDER — INSULIN ASPART 100 UNIT/ML ~~LOC~~ SOLN: 0.0000 [IU] | Freq: Every day | SUBCUTANEOUS | Status: DC

## 2015-05-23 MED ORDER — PERFLUTREN LIPID MICROSPHERE
1.0000 mL | INTRAVENOUS | Status: AC | PRN
  Administered 2015-05-23: 2 mL via INTRAVENOUS
  Filled 2015-05-23: qty 10

## 2015-05-23 MED ORDER — INSULIN DETEMIR 100 UNIT/ML ~~LOC~~ SOLN
20.0000 [IU] | Freq: Every day | SUBCUTANEOUS | Status: DC
  Filled 2015-05-23: qty 0.2

## 2015-05-23 NOTE — Progress Notes (Addendum)
[name]    Progress Note   Leroy [name] ZOX:[id number] DOB: 06/28/1945 DOA: 05/22/2015 PCP: [name], MD   Brief Narrative:   Leroy [name] is an 70 y.o. male  with history of paroxysmal atrial fibrillation on metoprolol and xarelto, CAD status post CABG, ischemic cardiomyopathy, diabetes mellitus was brought to the ER after patient's friends noticed that patient was having slurred speech and left facial droop around 2 PM today. Patient otherwise denies any visual symptoms or any weakness of upper or lower extremities.   Assessment/Plan:   Stroke (cerebrum) (HCC): HgbA1c pending, fasting lipid panel HDL > 40, LDL > 100, he has not tolerated Lipitor will start Crestor. MR Limited 2 sequence MRI; no acute ischemia. 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 MRA Probable stenosis of RIGHT cavernous internal carotid artery and RIGHT middle cerebral artery PT, OT, Speech consult pending. Echocardiogram and carotid dopplers pending. Prophylactic therapy-Antiplatelet med: Aspirin - dose  PO daily  Cardiac Monitoring , with no events. He relates he has not been compliant with his medications.  ATRIAL FIBRILLATION, PAROXYSMAL: chads 2 vasc score is more than 2 Continue serotonin metoprolol rate control.  Chronic systolic heart failure (HCC): Seems to become too sedated continue current regimen.  Diabetes mellitus type 2 with peripheral artery disease [facility]): Blood glucose high passed swallow evaluation start Levemir twice a day plus sliding scale insulin. A1c is pending.  Elevated troponins: He is chest pain-free, EKG showed unchanged from previous. He denies any angina, limits. Unlikely ACS. Continue aspirin and statins fro CVA/TIA.    DVT Prophylaxis - Lovenox ordered.  Family Communication: none Disposition Plan: Home 1-2 days Code Status:     Code Status Orders        Start     Ordered   05/22/15 2125  Full code    Continuous     05/22/15 2124    Code Status History    Date Active Date Inactive Code Status Order ID Comments User Context   04/20/2014  2:22 PM 04/22/2014  9:42 PM Full Code [id number]  Raymond Gurney, PA-C Inpatient   04/21/2013  6:50 PM 05/07/2013  5:50 PM Full Code [id number]  Charlton Amor, PA-C Inpatient   04/12/2013  7:53 PM 04/21/2013  6:50 PM Full Code [id number]  [name], NP Inpatient   03/25/2013  2:59 PM 03/28/2013  1:47 PM Full Code [id number]  [name], PA-C Inpatient   06/03/2012  4:37 PM 06/11/2012  7:07 PM Full Code [id number]  [name], MD Inpatient        IV Access:    Peripheral IV   Procedures and diagnostic studies:   Ct Head Wo Contrast  05/22/2015  CLINICAL DATA:  Left-sided facial droop with slurred speech onset on known has since resolved EXAM: CT HEAD WITHOUT CONTRAST TECHNIQUE: Contiguous axial images were obtained from the base of the skull through the vertex without intravenous contrast. COMPARISON:  None. FINDINGS: Moderate to severe diffuse cortical atrophy. Tiny chronic lacunar infarcts in both thalamus and in the deep periventricular white matter on the left. No evidence of acute vascular territory infarct. No hemorrhage or extra-axial fluid. Encephalomalacia inferior left cerebellar hemisphere. Calvarium intact with no significant inflammatory change in the visualized portions of the paranasal sinuses. IMPRESSION: Chronic involutional change with chronic lacunar infarcts and encephalomalacia left cerebellar hemisphere. No acute findings. Electronically Signed   By: Esperanza Heir M.D.   On: 05/22/2015 19:04   Mr  Mra Head Wo Contrast  05/23/2015  CLINICAL DATA:  Slurred speech and LEFT facial droop beginning this afternoon. Poor historian, history of hypertension, diabetes. EXAM: MRI HEAD WITHOUT CONTRAST MRA HEAD WITHOUT CONTRAST TECHNIQUE: Sagittal T1, axial diffusion weighted imaging. Angiographic images of the head were obtained using MRA  technique without contrast. COMPARISON:  CT head May 22, 2015 FINDINGS: MRI HEAD FINDINGS Motion degraded examination, per technologist note patient was claustrophobic, and could not complete examination. No reduced diffusion to suggest acute ischemia. Mesial LEFT inferior cerebellar encephalomalacia again noted. No hydrocephalus, midline shift/mass effect. No abnormal extra-axial fluid collections. No abnormal sellar expansion ; prominent posterior pituitary bright spot. Known cerebellar tonsillar ectopia. No suspicious calvarial bone marrow signal. Patient is edentulous. MRA HEAD FINDINGS Severely motion degraded examination. Flow related enhancement within the included cervical petrous cavernous internal carotid arteries. Thready flow related enhancement of RIGHT cavernous internal carotid artery with suspected at least mild stenosis of the bilateral supraclinoid internal carotid arteries. Flow related enhancement within the anterior and middle cerebral arteries with possible stenosis distal RIGHT M1 segment. Thready flow related enhancement LEFT vertebral artery, no discernible flow related enhancement of RIGHT V4 segment. No discernible flow related enhancement of basilar artery. Apparent fetal origin of the bilateral posterior cerebral arteries with thready flow related enhancement. IMPRESSION: MRI HEAD: Limited 2 sequence MRI; no acute ischemia. 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 MRA HEAD: Severely motion degraded examination. Poor flow related enhancement of the posterior circulation without discernible flow within basilar artery, in part due to fetal origin of posterior cerebral arteries. Findings would be better characterized on CTA head as clinically indicated. Probable stenosis of RIGHT cavernous internal carotid artery and RIGHT middle cerebral artery. Again, CTA would better characterize findings. Electronically Signed   By: Awilda Metro M.D.   On: 05/23/2015  02:30   Mr Brain Wo Contrast  05/23/2015  CLINICAL DATA:  Slurred speech and LEFT facial droop beginning this afternoon. Poor historian, history of hypertension, diabetes. EXAM: MRI HEAD WITHOUT CONTRAST MRA HEAD WITHOUT CONTRAST TECHNIQUE: Sagittal T1, axial diffusion weighted imaging. Angiographic images of the head were obtained using MRA technique without contrast. COMPARISON:  CT head May 22, 2015 FINDINGS: MRI HEAD FINDINGS Motion degraded examination, per technologist note patient was claustrophobic, and could not complete examination. No reduced diffusion to suggest acute ischemia. Mesial LEFT inferior cerebellar encephalomalacia again noted. No hydrocephalus, midline shift/mass effect. No abnormal extra-axial fluid collections. No abnormal sellar expansion ; prominent posterior pituitary bright spot. Known cerebellar tonsillar ectopia. No suspicious calvarial bone marrow signal. Patient is edentulous. MRA HEAD FINDINGS Severely motion degraded examination. Flow related enhancement within the included cervical petrous cavernous internal carotid arteries. Thready flow related enhancement of RIGHT cavernous internal carotid artery with suspected at least mild stenosis of the bilateral supraclinoid internal carotid arteries. Flow related enhancement within the anterior and middle cerebral arteries with possible stenosis distal RIGHT M1 segment. Thready flow related enhancement LEFT vertebral artery, no discernible flow related enhancement of RIGHT V4 segment. No discernible flow related enhancement of basilar artery. Apparent fetal origin of the bilateral posterior cerebral arteries with thready flow related enhancement. IMPRESSION: MRI HEAD: Limited 2 sequence MRI; no acute ischemia. 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 MRA HEAD: Severely motion degraded examination. Poor flow related enhancement of the posterior circulation without discernible flow within basilar artery, in  part due to fetal origin of posterior cerebral arteries. Findings would be better characterized on CTA head as clinically indicated.  Probable stenosis of RIGHT cavernous internal carotid artery and RIGHT middle cerebral artery. Again, CTA would better characterize findings. Electronically Signed   By: Awilda Metro M.D.   On: 05/23/2015 02:30     Medical Consultants:    None.  Anti-Infectives:   Anti-infectives    None      Subjective:    Leroy [name] no complains, has not been compliant with his medications.  Objective:    Filed Vitals:   05/22/15 2330 05/23/15 0200 05/23/15 0400 05/23/15 0600  BP: 161/68 159/45 163/111 163/91  Pulse: 86 88 89 79  Temp: 98.3 F (36.8 C) 98.4 F (36.9 C) 98.3 F (36.8 C) 98 F (36.7 C)  TempSrc: Oral Oral Oral Oral  Resp: 14 15 15 15   Height:      Weight:      SpO2: 95% 94% 94% 98%    Intake/Output Summary (Last 24 hours) at 05/23/15 1006 Last data filed at 05/23/15 0700  Gross per 24 hour  Intake    240 ml  Output      0 ml  Net    240 ml   Filed Weights   05/22/15 1750 05/22/15 2130  Weight: 136.079 kg (300 lb) 135.081 kg (297 lb 12.8 oz)    Exam: Gen:  NAD Cardiovascular:  RRR. Chest and lungs:   CTAB Abdomen:  Abdomen soft, NT/ND, + BS Extremities:  No edema   Data Reviewed:    Labs: Basic Metabolic Panel:  Recent Labs Lab 05/22/15 1800 05/22/15 1811 05/23/15 0528  NA 136 137 139  K 4.6 4.5 4.4  CL 104 101 103  CO2 21*  --  22  GLUCOSE 333* 337* 246*  BUN 14 16 11   CREATININE 1.39* 1.20 1.23  CALCIUM 9.0  --  9.1   GFR Estimated Creatinine Clearance: 78.5 mL/min (by C-G formula based on Cr of 1.23). Liver Function Tests:  Recent Labs Lab 05/22/15 1800 05/23/15 0528  AST 17 16  ALT 19 16*  ALKPHOS 84 78  BILITOT 0.3 0.4  PROT 6.7 6.2*  ALBUMIN 3.1* 2.6*   No results for input(s): LIPASE, AMYLASE in the last 168 hours. No results for input(s): AMMONIA in the last 168  hours. Coagulation profile  Recent Labs Lab 05/22/15 1800  INR 1.01    CBC:  Recent Labs Lab 05/22/15 1800 05/22/15 1811 05/23/15 0528  WBC 6.5  --  6.1  NEUTROABS 4.0  --   --   HGB 15.2 16.7 15.2  HCT 45.3 49.0 44.7  MCV 89.0  --  87.0  PLT 193  --  162   Cardiac Enzymes:  Recent Labs Lab 05/22/15 2314 05/23/15 0528  TROPONINI 0.21* 0.18*   BNP (last 3 results) No results for input(s): PROBNP in the last 8760 hours. CBG:  Recent Labs Lab 05/22/15 1859 05/22/15 2221 05/23/15 0633  GLUCAP 299* 217* 185*   D-Dimer: No results for input(s): DDIMER in the last 72 hours. Hgb A1c: No results for input(s): HGBA1C in the last 72 hours. Lipid Profile:  Recent Labs  05/23/15 0528  CHOL 209*  HDL 45  LDLCALC 142*  TRIG 108  CHOLHDL 4.6   Thyroid function studies: No results for input(s): TSH, T4TOTAL, T3FREE, THYROIDAB in the last 72 hours.  Invalid input(s): FREET3 Anemia work up: No results for input(s): VITAMINB12, FOLATE, FERRITIN, TIBC, IRON, RETICCTPCT in the last 72 hours. Sepsis Labs:  Recent Labs Lab 05/22/15 1800 05/23/15 0528  WBC 6.5 6.1  Microbiology No results found for this or any previous visit (from the past 240 hour(s)).   Medications:   . insulin aspart  0-9 Units Subcutaneous TID WC  . insulin aspart  10 Units Subcutaneous TID WC  . insulin glargine  50 Units Subcutaneous BID  . lisinopril  5 mg Oral Daily  . metoprolol succinate  50 mg Oral Daily  . rivaroxaban  20 mg Oral Q supper   Continuous Infusions:   Time spent: 25 min   LOS: 1 day   Marinda Elk  [facility] Pager [phone]  *Please refer to [email or web address], password [email or web address] to get updated schedule on who will round on this patient, as hospitalists switch teams weekly. If 7PM-7AM, please contact night-coverage at [email or web address], password [email or web address] for any overnight needs.  05/23/2015, 10:06 AM

## 2015-05-23 NOTE — Progress Notes (Signed)
  Echocardiogram 2D Echocardiogram has been performed.  [name] M 05/23/2015, 2:41 PM

## 2015-05-23 NOTE — Evaluation (Signed)
Physical Therapy Evaluation Patient Details Name: Leroy [name] MRN: [id number] DOB: Oct 26, 1945 Today's Date: 05/23/2015   History of Present Illness  Leroy [name] is a 70 y.o. male with history of paroxysmal atrial fibrillation on metoprolol and xarelto, CAD status post CABG, ischemic cardiomyopathy, diabetes mellitus was brought to the ER after patient's friends noticed that patient was having slurred speech and left facial droop around 2 PM today. Patient otherwise denies any visual symptoms or any weakness of upper or lower extremities. CT of the head did not show anything acute and by the time neurologists evaluated the patient patient's slurred speech improved but still had some left facial droop. Patient has been admitted for further stroke workup  Clinical Impression  Pt with the above recent medical history presents today with limited mobility and safety awareness. PTA pt was independent in transfers into electric w/c for mobility and walked short distances with RW. Today pt requiring Min A - Min G for transfers and mobility with RW as he displays slight impulsivness and lack of deficits. Pt is near his baseline level of mobility however recommending intermittent supervision to return to a Mod I level of function.     Follow Up Recommendations Supervision - Intermittent;No PT follow up    Equipment Recommendations  None recommended by PT    Recommendations for Other Services       Precautions / Restrictions Precautions Precautions: Fall Precaution Comments: Bil BKA Required Braces or Orthoses: Other Brace/Splint Other Brace/Splint: Bil prosthesis Restrictions Weight Bearing Restrictions: No      Mobility  Bed Mobility Overal bed mobility: Modified Independent             General bed mobility comments: needs increased time, HOB flat, uses momentum and ant/post rolling to achieve sitting EOB  Transfers Overall transfer level: Needs assistance Equipment used:  Rolling walker (2 wheeled) Transfers: Sit to/from Stand Sit to Stand: Min guard         General transfer comment: VC's for hand placement and RW safety, pt steady upon inital stand  Ambulation/Gait Ambulation/Gait assistance: Min assist Ambulation Distance (Feet): 70 Feet Assistive device: Rolling walker (2 wheeled) Gait Pattern/deviations: Step-through pattern;Decreased stride length;Trunk flexed;Leaning posteriorly Gait velocity: decreased Gait velocity interpretation: Below normal speed for age/gender General Gait Details: Pt with 1 LOB requring external support crossing threshold of room, cues for safety with RW as pt picks up walker off of floor to turn, posterior lean for clearance of prosthesis  Stairs            Wheelchair Mobility    Modified Rankin (Stroke Patients Only)       Balance Overall balance assessment: Needs assistance Sitting-balance support: No upper extremity supported;Feet unsupported   Sitting balance - Comments: pt dons prosthetics sitting EOB w/o loss of balance   Standing balance support: Bilateral upper extremity supported Standing balance-Leahy Scale: Fair Standing balance comment: RW for ambulation                             Pertinent Vitals/Pain Pain Assessment: No/denies pain    Home Living Family/patient expects to be discharged to:: Private residence Living Arrangements: Alone Available Help at Discharge: Available PRN/intermittently;Friend(s) (work friends/cousins can come by) Type of Home: House Home Access: Ramped entrance     Home Layout: One level Home Equipment: Environmental consultant - 2 wheels;Shower seat;Wheelchair - power      Prior Function Level of Independence: Independent with assistive device(s)  Comments: Uses electric w/c and walker to get around, independent in transfers, works IT trainer, drives Engineer, water Dominance        Extremity/Trunk Assessment   Upper Extremity  Assessment: Overall WFL for tasks assessed           Lower Extremity Assessment: Overall WFL for tasks assessed      Cervical / Trunk Assessment: Normal  Communication   Communication: No difficulties  Cognition Arousal/Alertness: Awake/alert Behavior During Therapy: Impulsive Overall Cognitive Status: Impaired/Different from baseline Area of Impairment: Safety/judgement         Safety/Judgement: Decreased awareness of safety     General Comments: Pt slightly impulsive during session, began walking with RW after he said he doesn't walk at all, does not think he has any safety deficits despite being unsteady while ambulating    General Comments      Exercises        Assessment/Plan    PT Assessment Patient needs continued PT services  PT Diagnosis Difficulty walking;Abnormality of gait;Generalized weakness   PT Problem List Decreased balance;Decreased mobility;Decreased knowledge of use of DME;Decreased safety awareness  PT Treatment Interventions Gait training;Functional mobility training;Therapeutic activities;Therapeutic exercise;Balance training;Patient/family education   PT Goals (Current goals can be found in the Care Plan section) Acute Rehab PT Goals Patient Stated Goal: get out of hospital PT Goal Formulation: With patient Time For Goal Achievement: 06/06/15 Potential to Achieve Goals: Good    Frequency Min 2X/week   Barriers to discharge Decreased caregiver support      Co-evaluation               End of Session Equipment Utilized During Treatment: Gait belt Activity Tolerance: Patient tolerated treatment well Patient left: in chair;with call bell/phone within reach;with chair alarm set Nurse Communication: Mobility status;Other (comment) (pt statsing he does not plan to call for help to get restro )         Time: 0803-0826 PT Time Calculation (min) (ACUTE ONLY): 23 min   Charges:   PT Evaluation $PT Eval Moderate Complexity: 1  Procedure PT Treatments $Gait Training: 8-22 mins   PT G Codes:        Ulyses Jarred 08-Jun-2015, 10:43 AM Ulyses Jarred, Student Physical Therapist Acute Rehab [phone]

## 2015-05-23 NOTE — Progress Notes (Signed)
STROKE TEAM PROGRESS NOTE   HISTORY OF PRESENT ILLNESS Leroy [name] is an 70 y.o. male patient who presented to the ER for further evaluation of slurred speech symptoms which started earlier in the afternoon at around 2 PM on 05/22/2015 (possible LKW, poor historian). Patient is a poor historian. He lives alone and he denies any knowledge of symptoms this morning. At around 2 PM when he was talking to some people they noticed slurred speech and left facial droop and hence was sent to the ER for further evaluation. He has a poorly controlled diabetes, bilateral above-knee amputations . Denies any new vision problems or any new sensorimotor symptoms in the limbs. Speech symptoms have resolved at this time. Patient does not have any knowledge of facial droop . Patient has history of atrial fibrillation, on anticoagulation with Xarelto, his last dose at 6 PM last night. Patient was not administered TPA secondary symptoms resolved, outside tPA time window, on anticoagulation with Xarelto.[name] He was admitted for further evaluation and treatment.   SUBJECTIVE (INTERVAL HISTORY) No family is at the bedside.  Patient wihtout new neuro complaints. He stated that he supposed to take Xarelto daily for Afib but he has not done so yet. He understood that he should be compliant with medication and control his DM.    OBJECTIVE Temp:  [97.4 F (36.3 C)-98.4 F (36.9 C)] 98.1 F (36.7 C) (02/14 1016) Pulse Rate:  [79-93] 82 (02/14 1016) Cardiac Rhythm:  [-] Normal sinus rhythm (02/14 0700) Resp:  [14-23] 16 (02/14 1016) BP: (108-171)/(45-111) 157/53 mmHg (02/14 1016) SpO2:  [93 %-98 %] 95 % (02/14 1016) Weight:  [135.081 kg (297 lb 12.8 oz)-136.079 kg (300 lb)] 135.081 kg (297 lb 12.8 oz) (02/13 2130)  CBC:   Recent Labs Lab 05/22/15 1800 05/22/15 1811 05/23/15 0528  WBC 6.5  --  6.1  NEUTROABS 4.0  --   --   HGB 15.2 16.7 15.2  HCT 45.3 49.0 44.7  MCV 89.0  --  87.0  PLT 193  --  162    Basic  Metabolic Panel:   Recent Labs Lab 05/22/15 1800 05/22/15 1811 05/23/15 0528  NA 136 137 139  K 4.6 4.5 4.4  CL 104 101 103  CO2 21*  --  22  GLUCOSE 333* 337* 246*  BUN CREATININE 1.39* 1.20 1.23  CALCIUM 9.0  --  9.1    Lipid Panel:     Component Value Date/Time   CHOL 209* 05/23/2015 0528   TRIG 108 05/23/2015 0528   HDL 45 05/23/2015 0528   CHOLHDL 4.6 05/23/2015 0528   VLDL 22 05/23/2015 0528   LDLCALC 142* 05/23/2015 0528   HgbA1c:  Lab Results  Component Value Date   HGBA1C 11.5* 09/26/2014   Urine Drug Screen: No results found for: LABOPIA, COCAINSCRNUR, LABBENZ, AMPHETMU, THCU, LABBARB    IMAGING I have personally reviewed the radiological images below and agree with the radiology interpretations.  Ct Head Wo Contrast  05/22/2015  CLINICAL DATA:  Left-sided facial droop with slurred speech onset on known has since resolved EXAM: CT HEAD WITHOUT CONTRAST TECHNIQUE: Contiguous axial images were obtained from the base of the skull through the vertex without intravenous contrast. COMPARISON:  None. FINDINGS: Moderate to severe diffuse cortical atrophy. Tiny chronic lacunar infarcts in both thalamus and in the deep periventricular white matter on the left. No evidence of acute vascular territory infarct. No hemorrhage or extra-axial fluid. Encephalomalacia inferior left cerebellar hemisphere. Calvarium  intact with no significant inflammatory change in the visualized portions of the paranasal sinuses. IMPRESSION: Chronic involutional change with chronic lacunar infarcts and encephalomalacia left cerebellar hemisphere. No acute findings. Electronically Signed   By: Esperanza Heir M.D.   On: 05/22/2015 19:04   Mr [name] Head Wo Contrast  MRI HEAD 05/23/2015  Limited 2 sequence MRI; no acute ischemia. 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   MRA HEAD 05/23/2015  Severely motion degraded examination. Poor flow related enhancement of the  posterior circulation without discernible flow within basilar artery, in part due to fetal origin of posterior cerebral arteries. Probable stenosis of RIGHT cavernous internal carotid artery and RIGHT middle cerebral artery.    Carotid Doppler   There is 1-39% bilateral ICA stenosis. Vertebral artery flow is antegrade.    TTE - pending   Physical exam  Temp:  [97.4 F (36.3 C)-98.4 F (36.9 C)] 98.4 F (36.9 C) (02/14 1756) Pulse Rate:  [73-93] 74 (02/14 1756) Resp:  [14-22] 18 (02/14 1756) BP: (108-165)/(45-111) 139/46 mmHg (02/14 1756) SpO2:  [94 %-99 %] 99 % (02/14 1756) Weight:  [297 lb 12.8 oz (135.081 kg)] 297 lb 12.8 oz (135.081 kg) (02/13 2130)  General - obese, well developed, in no apparent distress.  Ophthalmologic - Fundi not visualized due to noncooperation.  Cardiovascular - Regular rate and rhythm.  Mental Status -  Level of arousal and orientation to time, place, and person were intact. Language including expression, naming, repetition, comprehension was assessed and found intact. Fund of Knowledge was assessed and was intact.  Cranial Nerves II - XII - II - Visual field intact OU. III, IV, VI - Extraocular movements intact. V - Facial sensation intact bilaterally. VII - Facial movement intact bilaterally. VIII - Hearing & vestibular intact bilaterally. X - Palate elevates symmetrically. XI - Chin turning & shoulder shrug intact bilaterally. XII - Tongue protrusion intact.  Motor Strength - The patient's strength was normal in all extremities although b/l BKA. and pronator drift was absent. Bulk was normal and fasciculations were absent.   Motor Tone - Muscle tone was assessed at the neck and appendages and was normal.  Reflexes - The patient's reflexes were 1+ in all extremities and he had no pathological reflexes.  Sensory - Light touch, temperature/pinprick were assessed and were symmetrical.    Coordination - The patient had normal movements in the  hands with no ataxia or dysmetria.  Tremor was absent.  Gait and Station - not tested due to b/l BKA.   ASSESSMENT/PLAN [name] is a 70 y.o. male with history of of paroxysmal atrial fibrillation on metoprolol and xarelto, CAD status post CABG, ischemic cardiomyopathy, diabetes mellitus presenting with slurred speech and L facial droop. He did not receive IV t-PA due to delay in arrival.   TIA - R brain  MRI  No acute stroke  MRA  Severe diffuse large and medium vessel atherosclerosis  Carotid Doppler  No significant stenosis   2D Echo  pending   LDL 142  HgbA1c 11.5  xarelto for VTE prophylaxis Diet heart healthy/carb modified Room service appropriate?: Yes; Fluid consistency:: Thin  Xarelto (rivaroxaban) daily prior to admission, now on aspirin 81 mg daily and Xarelto (rivaroxaban) daily (aspirin added) due to significant large vessel athero and coronary artery disease  Patient counseled to be compliant with his antithrombotic medications  Ongoing aggressive stroke risk factor management  Therapy recommendations:  Supervision, no therapies  Disposition:  Return home  Atrial Fibrillation  Home anticoagulation:  Xarelto (rivaroxaban) daily continued in the hospital  Continue Xarelto at discharge  Pt not compliance with medication at home  Medication compliance again emphasized.  Hypertension  Slightly elevated  Bp goal gradually normalized within 5-7 days.  On lisinopril and metoprolol   Hyperlipidemia  Home meds:  zetia,  resumed in hospital  Intolerant to statins, muscle aches  LDL 142, goal < 70  Started on crestor 20  Continue statin at discharge  Diabetes type II with PAD  HgbA1c 11.5, goal < 7.0  Uncontrolled  Pt not compliance with medication  SSI  On levemir  Other Stroke Risk Factors  Advanced age  Former Cigarette smoker  Morbid Obesity, Body mass index is 43.96 kg/(m^2).   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 Please call with questions. Pt will follow up with Darrol Angel NP at [facility] in one month. Thanks for the consult.  Marvel Plan, MD PhD Stroke Neurology 05/23/2015 6:46 PM      To contact Stroke Continuity provider, please refer to [email or web address]. After hours, contact General Neurology

## 2015-05-23 NOTE — Evaluation (Signed)
Occupational Therapy Evaluation Patient Details Name: Leroy [name] MRN: [id number] DOB: 01/15/1946 Today's Date: 05/23/2015    History of Present Illness Leroy [name] is a 70 y.o. male with history of paroxysmal atrial fibrillation on metoprolol and xarelto, CAD status post CABG, ischemic cardiomyopathy, diabetes mellitus was brought to the ER after patient's friends noticed that patient was having slurred speech and left facial droop around 2 PM today. Patient otherwise denies any visual symptoms or any weakness of upper or lower extremities. CT of the head did not show anything acute and by the time neurologists evaluated the patient patient's slurred speech improved but still had some left facial droop. Patient has been admitted for further stroke workup   Clinical Impression   Patient presenting with decreased ADL and functional mobility independence secondary to above. Patient mod I PTA. Patient currently functioning at an overall supervision to min assist level. Patient will benefit from acute OT to increase overall independence in the areas of ADLs, functional mobility, and overall safety in order to safely discharge home alone.   Limited eval secondary to friend in room and pt refused getting OOB with OT.     Follow Up Recommendations  No OT follow up;Supervision - Intermittent    Equipment Recommendations    None at this time   Recommendations for Other Services  None at this time   Precautions / Restrictions Precautions Precautions: Fall Precaution Comments: Bil BKA Required Braces or Orthoses: Other Brace/Splint Other Brace/Splint: Bil prosthesis Restrictions Weight Bearing Restrictions: No    Mobility Bed Mobility Overal bed mobility: Modified Independent General bed mobility comments: needs increased time, HOB flat, uses momentum and ant/post rolling to achieve sitting EOB  Transfers See PT note   Balance Overall balance assessment: Needs  assistance Sitting-balance support: No upper extremity supported;Feet unsupported Sitting balance-Leahy Scale: Good    ADL Overall ADL's : Needs assistance/impaired General ADL Comments: Pt overall supervision to min assist. Will benefit from acute OT.     Vision Vision Assessment?: No apparent visual deficits          Pertinent Vitals/Pain Pain Assessment: No/denies pain     Hand Dominance Right   Extremity/Trunk Assessment Upper Extremity Assessment Upper Extremity Assessment: Overall WFL for tasks assessed   Lower Extremity Assessment Lower Extremity Assessment: Overall WFL for tasks assessed   Cervical / Trunk Assessment Cervical / Trunk Assessment: Normal   Communication Communication Communication: No difficulties   Cognition Arousal/Alertness: Awake/alert Behavior During Therapy: Impulsive Overall Cognitive Status: Impaired/Different from baseline Area of Impairment: Safety/judgement Safety/Judgement: Decreased awareness of safety;Decreased awareness of deficits              Home Living Family/patient expects to be discharged to:: Private residence Living Arrangements: Alone Available Help at Discharge: Available PRN/intermittently;Friend(s) (work friends and cousin can come by) Type of Home: House Home Access: Ramped entrance     Home Layout: One level     Bathroom Shower/Tub: Chief Strategy Officer: Handicapped height Bathroom Accessibility: Yes   Home Equipment: Environmental consultant - 2 wheels;Shower seat;Wheelchair - power   Prior Functioning/Environment Level of Independence: Independent with assistive device(s)        Comments: Uses electric w/c and walker to get around, independent in transfers, works IT trainer, drives illegally    OT Diagnosis: Generalized weakness   OT Problem List: Decreased strength;Decreased activity tolerance;Impaired balance (sitting and/or standing);Decreased safety awareness   OT  Treatment/Interventions: Self-care/ADL training;Therapeutic exercise;DME and/or AE instruction;Therapeutic activities;Patient/family education;Balance training  OT Goals(Current goals can be found in the care plan section) Acute Rehab OT Goals Patient Stated Goal: get out of hospital OT Goal Formulation: With patient Time For Goal Achievement: 06/06/15 Potential to Achieve Goals: Good ADL Goals Pt Will Perform Grooming: with modified independence;standing Pt Will Transfer to Toilet: with modified independence;ambulating Additional ADL Goal #1: Pt will independently don right and left prosthesis   OT Frequency: Min 2X/week   Barriers to D/C: Decreased caregiver support   End of Session Nurse Communication: Other (comment) (pt seated EOB)  Activity Tolerance: Patient tolerated treatment well Patient left: in bed;with call bell/phone within reach;with family/visitor present (seated EOB)   Time: 1694-5038 OT Time Calculation (min): 12 min Charges:  OT General Charges $OT Visit: 1 Procedure OT Evaluation $OT Eval Low Complexity: 1 Procedure  Edwin Cap , MS, OTR/L, CLT Pager: [phone]  05/23/2015, 3:58 PM

## 2015-05-23 NOTE — Progress Notes (Signed)
VASCULAR LAB PRELIMINARY  PRELIMINARY  PRELIMINARY  PRELIMINARY  Carotid duplex completed.    Preliminary report:  Bilateral:  1-39% ICA stenosis.  Vertebral artery flow is antegrade.     Kamden Reber, RVS 05/23/2015, 11:38 AM

## 2015-05-23 NOTE — Progress Notes (Signed)
Patient arrived from ED around 2130, alert and oriented all symptoms have resolved, patient has bilateral BKA both prothesis with patient but not on, will continue to monitor.

## 2015-05-23 NOTE — Care Management Note (Signed)
Case Management Note  Patient Details  Name: Leroy [name] MRN: [id number] Date of Birth: 1945-09-28  Subjective/Objective:   Patient is admitted with stroke like symptoms. MRI negative. Patient is from home.                  Action/Plan: No f/u recommended per PT. CM continuing to follow for discharge needs.   Expected Discharge Date:                  Expected Discharge Plan:     In-House Referral:     Discharge planning Services     Post Acute Care Choice:    Choice offered to:     DME Arranged:    DME Agency:     HH Arranged:    HH Agency:     Status of Service:  In process, will continue to follow  Medicare Important Message Given:    Date Medicare IM Given:    Medicare IM give by:    Date Additional Medicare IM Given:    Additional Medicare Important Message give by:     If discussed at Long Length of Stay Meetings, dates discussed:    Additional Comments:  Kermit Balo, RN 05/23/2015, 4:03 PM

## 2015-05-23 NOTE — Progress Notes (Signed)
Patient not available for 0130 nero and vitals check he having MRI will resume when he returns.

## 2015-05-24 DIAGNOSIS — I5022 Chronic systolic (congestive) heart failure: Secondary | ICD-10-CM | POA: Diagnosis not present

## 2015-05-24 DIAGNOSIS — G459 Transient cerebral ischemic attack, unspecified: Secondary | ICD-10-CM | POA: Diagnosis present

## 2015-05-24 DIAGNOSIS — I480 Paroxysmal atrial fibrillation: Secondary | ICD-10-CM | POA: Diagnosis not present

## 2015-05-24 DIAGNOSIS — E1151 Type 2 diabetes mellitus with diabetic peripheral angiopathy without gangrene: Secondary | ICD-10-CM | POA: Diagnosis not present

## 2015-05-24 DIAGNOSIS — I25709 Atherosclerosis of coronary artery bypass graft(s), unspecified, with unspecified angina pectoris: Secondary | ICD-10-CM | POA: Diagnosis not present

## 2015-05-24 LAB — GLUCOSE, CAPILLARY
GLUCOSE-CAPILLARY: 94 mg/dL (ref 65–99)
Glucose-Capillary: 178 mg/dL — ABNORMAL HIGH (ref 65–99)

## 2015-05-24 LAB — HEMOGLOBIN A1C
Hgb A1c MFr Bld: 12 % — ABNORMAL HIGH (ref 4.8–5.6)
MEAN PLASMA GLUCOSE: 298 mg/dL

## 2015-05-24 MED ORDER — ROSUVASTATIN CALCIUM 20 MG PO TABS: 20.0000 mg | ORAL_TABLET | Freq: Every day | ORAL | Status: DC

## 2015-05-24 MED ORDER — INSULIN DETEMIR 100 UNIT/ML ~~LOC~~ SOLN
12.0000 [IU] | Freq: Two times a day (BID) | SUBCUTANEOUS | Status: DC
  Filled 2015-05-24 (×2): qty 0.12

## 2015-05-24 NOTE — Progress Notes (Signed)
Pt discharged home with friend. No IV at time of discharge. Telemetry discontinued. Discharge instructions given. Pt has no questions at this time. Left unit via wheelchair with volunteer services at 1443. [place] Radar

## 2015-05-24 NOTE — Discharge Summary (Signed)
Physician Discharge Summary  Leroy [name]:[id number] DOB: 09-Aug-1945 DOA: 05/22/2015  PCP: [name], MD  Admit date: 05/22/2015 Discharge date: 05/24/2015  Time spent: 35 minutes  Recommendations for Outpatient Follow-up:  1. Follow up with neurology as an outpatient in 2-4 week  Discharge Diagnoses:  Principal Problem:   Stroke (cerebrum) (HCC) Active Problems:   ATRIAL FIBRILLATION, PAROXYSMAL   Chronic systolic heart failure (HCC)   Diabetes mellitus type 2 with peripheral artery disease (HCC)   Hemispheric carotid artery syndrome   Coronary artery disease involving coronary bypass graft of native heart with unspecified angina pectoris   PVD (peripheral vascular disease) (HCC)   TIA (transient ischemic attack)   Discharge Condition: stable  Diet recommendation: carb modified  Filed Weights   05/22/15 1750 05/22/15 2130  Weight: 136.079 kg (300 lb) 135.081 kg (297 lb 12.8 oz)    History of present illness:  70 y.o. male with history of paroxysmal atrial fibrillation on metoprolol and xarelto, CAD status post CABG, ischemic cardiomyopathy, diabetes mellitus was brought to the ER after patient's friends noticed that patient was having slurred speech and left facial droop around 2 PM on the day of admission  Hospital Course:  TIA: HgbA1c 12.0, fasting lipid panel HDL > 40, LDL > 100, he has not tolerated Lipitor, was started Crestor. MRI no CVA, MRA severe diffuse large and medium atherosclerosis. PT no home health needs. Echocardiogram diffuse hypokinesia, EF 25%and carotid dopplers no ICA. Prophylactic therapy-Antiplatelet med: Xarelto and aspirin. Cardiac Monitoring , with no events. He relates he has not been compliant with his medications. He was counseled repeatedly about compliance with medication   ATRIAL FIBRILLATION, PAROXYSMAL: CHADS VASC 2 score is more than 2 Continue Xarelto and  Metoprolol, heart rate control.  Chronic systolic heart failure  (HCC): Seems to be compensated continue current regimen.  Diabetes mellitus type 2 with peripheral artery disease (HCC): Cont Levemir twice a day plus sliding scale insulin. Resume home regimen. A1c is 12.0, has not been complaint with his medication at home.  Elevated troponins: He is chest pain-free, EKG showed unchanged from previous. He denies any angina, limits. Unlikely ACS. Continue aspirin and statins for CVA/TIA.  Essential hypertension: On lisinopril and metoprolol. Bp is at goal.  Procedures:  CT head MRI brain  MRA  ECHO  Carotid doppler.  Consultations:  neurology  Discharge Exam: Filed Vitals:   05/24/15 0543 05/24/15 0915  BP: 99/60 120/68  Pulse: 82 74  Temp: 97.8 F (36.6 C) 98 F (36.7 C)  Resp: 18 16    General: A&O x3 Cardiovascular: RRR Respiratory: good air movement CTA B/L  Discharge Instructions   Discharge Instructions    Ambulatory referral to Neurology    Complete by:  As directed   Follow up with carolyn Daphine Deutscher NP at [facility] in one month. thanks     Diet - low sodium heart healthy    Complete by:  As directed      Increase activity slowly    Complete by:  As directed           Current Discharge Medication List    START taking these medications   Details  rosuvastatin (CRESTOR) 20 MG tablet Take 1 tablet (20 mg total) by mouth daily at 6 PM. Qty: 30 tablet, Refills: 0      CONTINUE these medications which have NOT CHANGED   Details  Canagliflozin-Metformin HCl (INVOKAMET) 50-1000 MG TABS Take 1 tablet twice a day Qty: 180 tablet,  Refills: 1    insulin aspart (NOVOLOG) 100 UNIT/ML injection Inject 5 Units into the skin 3 (three) times daily as needed for high blood sugar. Qty: 10 mL, Refills: 11   Associated Diagnoses: Type II diabetes mellitus with manifestations, uncontrolled (HCC)    insulin glargine (LANTUS) 100 UNIT/ML injection Inject 0.45 mLs (45 Units total) into the skin 2 (two) times daily. Qty: 10 mL,  Refills: 11   Associated Diagnoses: Type II diabetes mellitus with manifestations, uncontrolled (HCC)    lisinopril (PRINIVIL,ZESTRIL) 5 MG tablet Take 1 tablet (5 mg total) by mouth daily. Qty: 90 tablet, Refills: 3   Associated Diagnoses: Type II diabetes mellitus with manifestations, uncontrolled (HCC); Chronic systolic heart failure (HCC); Atherosclerosis of native coronary artery of native heart without angina pectoris; Atherosclerosis of native arteries of the extremities with ulceration (HCC)    metoprolol succinate (TOPROL-XL) 50 MG 24 hr tablet Take 1 tablet (50 mg total) by mouth 2 (two) times daily. Take with or immediately following a meal. Qty: 180 tablet, Refills: 3    nitroGLYCERIN (NITROSTAT) 0.4 MG SL tablet Place 1 tablet (0.4 mg total) under the tongue every 5 (five) minutes as needed for chest pain. Qty: 30 tablet, Refills: 12    ezetimibe (ZETIA) 10 MG tablet Take 1 tablet (10 mg total) by mouth daily. Qty: 30 tablet, Refills: 1    Insulin Syringe-Needle U-100 (B-D INS SYR HALF-UNIT .3CC/31G) 31G X 5/16" 0.3 ML MISC Use 3 times a day with insulin vial Qty: 300 each, Refills: 3    rivaroxaban (XARELTO) 20 MG TABS tablet Take 1 tablet (20 mg total) by mouth daily at 6 PM. Qty: 30 tablet, Refills: 5   Associated Diagnoses: Paroxysmal atrial fibrillation (HCC)      STOP taking these medications     metFORMIN (GLUCOPHAGE) 500 MG tablet        Allergies  Allergen Reactions  . Statins     Muscle aches  . Amlodipine Besylate     Muscle aches making difficult to walk  . Cephalexin     REACTION: Hives   Follow-up Information    Follow up with Nilda Riggs, NP. Schedule an appointment as soon as possible for a visit in 1 month.   Specialty:  Family Medicine   Why:  stroke clinic   Contact information:   [address] [place] [address] [phone]        The results of significant diagnostics from this hospitalization (including  imaging, microbiology, ancillary and laboratory) are listed below for reference.    Significant Diagnostic Studies: Ct Head Wo Contrast  05/22/2015  CLINICAL DATA:  Left-sided facial droop with slurred speech onset on known has since resolved EXAM: CT HEAD WITHOUT CONTRAST TECHNIQUE: Contiguous axial images were obtained from the base of the skull through the vertex without intravenous contrast. COMPARISON:  None. FINDINGS: Moderate to severe diffuse cortical atrophy. Tiny chronic lacunar infarcts in both thalamus and in the deep periventricular white matter on the left. No evidence of acute vascular territory infarct. No hemorrhage or extra-axial fluid. Encephalomalacia inferior left cerebellar hemisphere. Calvarium intact with no significant inflammatory change in the visualized portions of the paranasal sinuses. IMPRESSION: Chronic involutional change with chronic lacunar infarcts and encephalomalacia left cerebellar hemisphere. No acute findings. Electronically Signed   By: Esperanza Heir M.D.   On: 05/22/2015 19:04   Mr [name] Head Wo Contrast  05/23/2015  CLINICAL DATA:  Slurred speech and LEFT facial droop beginning this afternoon.  Poor historian, history of hypertension, diabetes. EXAM: MRI HEAD WITHOUT CONTRAST MRA HEAD WITHOUT CONTRAST TECHNIQUE: Sagittal T1, axial diffusion weighted imaging. Angiographic images of the head were obtained using MRA technique without contrast. COMPARISON:  CT head May 22, 2015 FINDINGS: MRI HEAD FINDINGS Motion degraded examination, per technologist note patient was claustrophobic, and could not complete examination. No reduced diffusion to suggest acute ischemia. Mesial LEFT inferior cerebellar encephalomalacia again noted. No hydrocephalus, midline shift/mass effect. No abnormal extra-axial fluid collections. No abnormal sellar expansion ; prominent posterior pituitary bright spot. Known cerebellar tonsillar ectopia. No suspicious calvarial bone marrow signal.  Patient is edentulous. MRA HEAD FINDINGS Severely motion degraded examination. Flow related enhancement within the included cervical petrous cavernous internal carotid arteries. Thready flow related enhancement of RIGHT cavernous internal carotid artery with suspected at least mild stenosis of the bilateral supraclinoid internal carotid arteries. Flow related enhancement within the anterior and middle cerebral arteries with possible stenosis distal RIGHT M1 segment. Thready flow related enhancement LEFT vertebral artery, no discernible flow related enhancement of RIGHT V4 segment. No discernible flow related enhancement of basilar artery. Apparent fetal origin of the bilateral posterior cerebral arteries with thready flow related enhancement. IMPRESSION: MRI HEAD: Limited 2 sequence MRI; no acute ischemia. 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 MRA HEAD: Severely motion degraded examination. Poor flow related enhancement of the posterior circulation without discernible flow within basilar artery, in part due to fetal origin of posterior cerebral arteries. Findings would be better characterized on CTA head as clinically indicated. Probable stenosis of RIGHT cavernous internal carotid artery and RIGHT middle cerebral artery. Again, CTA would better characterize findings. Electronically Signed   By: Awilda Metro M.D.   On: 05/23/2015 02:30   Mr Brain Wo Contrast  05/23/2015  CLINICAL DATA:  Slurred speech and LEFT facial droop beginning this afternoon. Poor historian, history of hypertension, diabetes. EXAM: MRI HEAD WITHOUT CONTRAST MRA HEAD WITHOUT CONTRAST TECHNIQUE: Sagittal T1, axial diffusion weighted imaging. Angiographic images of the head were obtained using MRA technique without contrast. COMPARISON:  CT head May 22, 2015 FINDINGS: MRI HEAD FINDINGS Motion degraded examination, per technologist note patient was claustrophobic, and could not complete examination. No  reduced diffusion to suggest acute ischemia. Mesial LEFT inferior cerebellar encephalomalacia again noted. No hydrocephalus, midline shift/mass effect. No abnormal extra-axial fluid collections. No abnormal sellar expansion ; prominent posterior pituitary bright spot. Known cerebellar tonsillar ectopia. No suspicious calvarial bone marrow signal. Patient is edentulous. MRA HEAD FINDINGS Severely motion degraded examination. Flow related enhancement within the included cervical petrous cavernous internal carotid arteries. Thready flow related enhancement of RIGHT cavernous internal carotid artery with suspected at least mild stenosis of the bilateral supraclinoid internal carotid arteries. Flow related enhancement within the anterior and middle cerebral arteries with possible stenosis distal RIGHT M1 segment. Thready flow related enhancement LEFT vertebral artery, no discernible flow related enhancement of RIGHT V4 segment. No discernible flow related enhancement of basilar artery. Apparent fetal origin of the bilateral posterior cerebral arteries with thready flow related enhancement. IMPRESSION: MRI HEAD: Limited 2 sequence MRI; no acute ischemia. 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 MRA HEAD: Severely motion degraded examination. Poor flow related enhancement of the posterior circulation without discernible flow within basilar artery, in part due to fetal origin of posterior cerebral arteries. Findings would be better characterized on CTA head as clinically indicated. Probable stenosis of RIGHT cavernous internal carotid artery and RIGHT middle cerebral artery. Again, CTA would better characterize findings. Electronically  Signed   By: Awilda Metro M.D.   On: 05/23/2015 02:30    Microbiology: No results found for this or any previous visit (from the past 240 hour(s)).   Labs: Basic Metabolic Panel:  Recent Labs Lab 05/22/15 1800 05/22/15 1811 05/23/15 0528  NA 136 137  139  K 4.6 4.5 4.4  CL 104 101 103  CO2 21*  --  22  GLUCOSE 333* 337* 246*  BUN 14 16 11   CREATININE 1.39* 1.20 1.23  CALCIUM 9.0  --  9.1   Liver Function Tests:  Recent Labs Lab 05/22/15 1800 05/23/15 0528  AST 17 16  ALT 19 16*  ALKPHOS 84 78  BILITOT 0.3 0.4  PROT 6.7 6.2*  ALBUMIN 3.1* 2.6*   No results for input(s): LIPASE, AMYLASE in the last 168 hours. No results for input(s): AMMONIA in the last 168 hours. CBC:  Recent Labs Lab 05/22/15 1800 05/22/15 1811 05/23/15 0528  WBC 6.5  --  6.1  NEUTROABS 4.0  --   --   HGB 15.2 16.7 15.2  HCT 45.3 49.0 44.7  MCV 89.0  --  87.0  PLT 193  --  162   Cardiac Enzymes:  Recent Labs Lab 05/22/15 2314 05/23/15 0528 05/23/15 1212  TROPONINI 0.21* 0.18* 0.15*   BNP: BNP (last 3 results) No results for input(s): BNP in the last 8760 hours.  ProBNP (last 3 results) No results for input(s): PROBNP in the last 8760 hours.  CBG:  Recent Labs Lab 05/23/15 1207 05/23/15 1615 05/23/15 2204 05/24/15 0623 05/24/15 1127  GLUCAP 160* 111* 107* 94 178*       Signed:  Marinda Elk MD.  [facility] 05/24/2015, 1:28 PM

## 2015-05-24 NOTE — Discharge Instructions (Signed)

## 2015-05-25 ENCOUNTER — Telehealth: Payer: Self-pay | Admitting: *Deleted

## 2015-05-25 NOTE — Telephone Encounter (Signed)
Called pt to set-up his TCM appt no answer LMOM RTC...Raechel Chute

## 2015-05-26 NOTE — Telephone Encounter (Signed)
Called pt again still no answer LMOM RTC.../lmb 

## 2015-05-29 NOTE — Telephone Encounter (Signed)
Tried calling pt again still no answer LMOM RTC for hosp f/u. closing encounter,,,/lmb

## 2015-06-07 NOTE — Progress Notes (Signed)
Physical Therapy Note  These are the G-codes associated with PT note on 2022-06-14 at 8am.    06/15/2015 0830  PT G-Codes **NOT FOR INPATIENT CLASS**  Functional Assessment Tool Used clinical judgement  Functional Limitation Mobility: Walking and moving around  Mobility: Walking and Moving Around Current Status (W0981) CI  Mobility: Walking and Moving Around Goal Status (X9147) CI    Lewis Shock, PT, DPT Pager #: [phone] Office #: [phone]

## 2015-06-21 ENCOUNTER — Ambulatory Visit (INDEPENDENT_AMBULATORY_CARE_PROVIDER_SITE_OTHER): Payer: Commercial Managed Care - HMO | Admitting: Nurse Practitioner

## 2015-06-21 ENCOUNTER — Encounter: Payer: Self-pay | Admitting: Nurse Practitioner

## 2015-06-21 VITALS — BP 140/70 | HR 65 | Ht 69.0 in | Wt 299.0 lb

## 2015-06-21 DIAGNOSIS — IMO0002 Reserved for concepts with insufficient information to code with codable children: Secondary | ICD-10-CM

## 2015-06-21 DIAGNOSIS — E785 Hyperlipidemia, unspecified: Secondary | ICD-10-CM

## 2015-06-21 DIAGNOSIS — I7025 Atherosclerosis of native arteries of other extremities with ulceration: Secondary | ICD-10-CM

## 2015-06-21 DIAGNOSIS — I482 Chronic atrial fibrillation, unspecified: Secondary | ICD-10-CM

## 2015-06-21 DIAGNOSIS — I2510 Atherosclerotic heart disease of native coronary artery without angina pectoris: Secondary | ICD-10-CM | POA: Diagnosis not present

## 2015-06-21 DIAGNOSIS — G451 Carotid artery syndrome (hemispheric): Secondary | ICD-10-CM | POA: Diagnosis not present

## 2015-06-21 DIAGNOSIS — E118 Type 2 diabetes mellitus with unspecified complications: Secondary | ICD-10-CM

## 2015-06-21 DIAGNOSIS — E1165 Type 2 diabetes mellitus with hyperglycemia: Secondary | ICD-10-CM | POA: Diagnosis not present

## 2015-06-21 DIAGNOSIS — G459 Transient cerebral ischemic attack, unspecified: Secondary | ICD-10-CM | POA: Diagnosis not present

## 2015-06-21 DIAGNOSIS — I480 Paroxysmal atrial fibrillation: Secondary | ICD-10-CM

## 2015-06-21 DIAGNOSIS — I5022 Chronic systolic (congestive) heart failure: Secondary | ICD-10-CM

## 2015-06-21 DIAGNOSIS — I25709 Atherosclerosis of coronary artery bypass graft(s), unspecified, with unspecified angina pectoris: Secondary | ICD-10-CM | POA: Diagnosis not present

## 2015-06-21 DIAGNOSIS — I739 Peripheral vascular disease, unspecified: Secondary | ICD-10-CM

## 2015-06-21 DIAGNOSIS — Z794 Long term (current) use of insulin: Secondary | ICD-10-CM

## 2015-06-21 MED ORDER — RIVAROXABAN 20 MG PO TABS: 20.0000 mg | ORAL_TABLET | Freq: Every day | ORAL | Status: DC

## 2015-06-21 MED ORDER — LISINOPRIL 5 MG PO TABS: 5.0000 mg | ORAL_TABLET | Freq: Every day | ORAL | Status: DC

## 2015-06-21 MED ORDER — ROSUVASTATIN CALCIUM 20 MG PO TABS: 20.0000 mg | ORAL_TABLET | Freq: Every day | ORAL | Status: DC

## 2015-06-21 NOTE — Progress Notes (Signed)
[facility]  PATIENT: Leroy [name] DOB: [birth year]   REASON FOR VISIT: Hospital follow-up  for episode of slurred speech ,TIA right brain HISTORY FROM: Patient    HISTORY OF PRESENT ILLNESS:Leroy [name] is an 70 y.o. male patient who presented to the ER for further evaluation of slurred speech symptoms which started earlier in the afternoon at around 2 PM on 05/22/2015. Patient is a poor historian. He lives alone and he denies any knowledge of symptoms earlier.  At around 2 PM on 2/13  he was talking to some people and  they noticed slurred speech and left facial droop and hence was sent to the ER for further evaluation. He has  poorly controlled diabetes, bilateral above-knee amputations . Denies any new vision problems or any new sensorimotor symptoms in the limbs. Speech symptoms have resolved at this time. Patient does not have any knowledge of facial droop . Patient has history of atrial fibrillation, on anticoagulation with Xarelto, his last dose at 6 PM last night. Patient was not administered TPA secondary symptoms resolved, outside tPA time window, on anticoagulation with Xarelto.. CT of the head with chronic involutional change with chronic lacunar infarcts and left cerebellar hemisphere no acute findings. MRI of the head no acute ischemia MRA of the head severely motion degraded examination probable stenosis of the right cavernous internal carotid artery and right middle cerebral artery. Carotid Doppler no significant stenosis. Echocardiogram EF 25% diffuse hypokinesia. LDL 142 hemoglobin A1c 11.5 Noncompliant with Xarelto at home. Aspirin 0.81 added with Xarelto. He returns today for follow-up.He has not had further stroke or TIA symptoms He has not followed up with her primary care and he is out of Crestor lisinopril and Xarelto.He returns for reevaluation  REVIEW OF SYSTEMS: Full 14 system review of systems performed and notable only for those listed, all others  are neg:  Constitutional: neg  Cardiovascular: neg Ear/Nose/Throat: neg  Skin: neg Eyes: neg Respiratory: neg Gastroitestinal: neg  Hematology/Lymphatic: neg  Endocrine: neg Musculoskeletal:neg Allergy/Immunology: neg Neurological: neg Psychiatric: neg Sleep : neg   ALLERGIES: Allergies  Allergen Reactions  . Statins     Muscle aches  . Amlodipine Besylate     Muscle aches making difficult to walk  . Cephalexin     REACTION: Hives    HOME MEDICATIONS: Outpatient Prescriptions Prior to Visit  Medication Sig Dispense Refill  . Canagliflozin-Metformin HCl (INVOKAMET) 50-1000 MG TABS Take 1 tablet twice a day (Patient taking differently: Take 1 tablet by mouth daily. ) 180 tablet 1  . ezetimibe (ZETIA) 10 MG tablet Take 1 tablet (10 mg total) by mouth daily. 30 tablet 1  . insulin aspart (NOVOLOG) 100 UNIT/ML injection Inject 5 Units into the skin 3 (three) times daily as needed for high blood sugar. (Patient taking differently: Inject 10 Units into the skin 3 (three) times daily as needed for high blood sugar (over 200). ) 10 mL 11  . insulin glargine (LANTUS) 100 UNIT/ML injection Inject 0.45 mLs (45 Units total) into the skin 2 (two) times daily. (Patient taking differently: Inject 50 Units into the skin 2 (two) times daily. ) 10 mL 11  . Insulin Syringe-Needle U-100 (B-D INS SYR HALF-UNIT .3CC/31G) 31G X 5/16" 0.3 ML MISC Use 3 times a day with insulin vial 300 each 3  . metoprolol succinate (TOPROL-XL) 50 MG 24 hr tablet Take 1 tablet (50 mg total) by mouth 2 (two) times daily. Take with or immediately following a meal. (Patient taking  differently: Take 50 mg by mouth daily. Take with or immediately following a meal.) 180 tablet 3  . nitroGLYCERIN (NITROSTAT) 0.4 MG SL tablet Place 1 tablet (0.4 mg total) under the tongue every 5 (five) minutes as needed for chest pain. 30 tablet 12  . rosuvastatin (CRESTOR) 20 MG tablet Take 1 tablet (20 mg total) by mouth daily at 6 PM. 30  tablet 0  . lisinopril (PRINIVIL,ZESTRIL) 5 MG tablet Take 1 tablet (5 mg total) by mouth daily. 90 tablet 3  . rivaroxaban (XARELTO) 20 MG TABS tablet Take 1 tablet (20 mg total) by mouth daily at 6 PM. 30 tablet 5   No facility-administered medications prior to visit.    PAST MEDICAL HISTORY: Past Medical History  Diagnosis Date  . Hyperlipidemia   . Ischemic cardiomyopathy     a. EF 40% 2010. b. Echo (2/14):  mild LVH, EF 30-35%, diff HK, Gr 1 DD, MAC, mild MR, mod LAE, PASP 39  . Cataract   . Hypotension   . Paroxysmal atrial fibrillation (HCC)   . Peripheral vascular disease (HCC)     a. s/p L BKA 2014.  [name] History of blood clots     in legs--this was in 2011--has been off of Coumadin 23months;takes Xarelto daily  . Joint pain   . H/O hiatal hernia   . Chronic kidney disease     on Lisinopril to protect kidneys d/t being on Metformin per pt  . Myocardial infarction (HCC)     total of 8 heart attacks;last one in 2011  . Atrial flutter (HCC)   . H/O medication noncompliance     Due to insurance issues  . Chronic systolic CHF (congestive heart failure) (HCC)   . Coronary artery disease     a. s/p remote CABG 2001 at [facility] b. Cath 03/2010: for med rx.;  c.  [name] myoview (1/14):  Large Inferior apical MI, very small area of peri-infarct ischemia toward the inferior apical segment. EF 19%.  Med Rx was continued.    . Diabetes mellitus 1979    takes Metformni daily  and Lantus 50units bid  . DVT (deep venous thrombosis) (HCC) 2011    legs  . Peripheral neuropathy (HCC)   . Shortness of breath dyspnea   . Stroke [facility])     PAST SURGICAL HISTORY: Past Surgical History  Procedure Laterality Date  . Revasculariztion  2011/2010    x 2   . Tonsillectomy    . Cardiac catheterization  03/19/10  . Abdominal aortagram  04-30-12  . Adenoidectomy    . Amputation Left 06/03/2012    Procedure: AMPUTATION BELOW KNEE;  Surgeon: [name], MD;  Location: [facility] OR;  Service: Vascular;   Laterality: Left;  . Coronary angioplasty  2002    3 stents  . Endarterectomy femoral Right 03/25/2013    Procedure: ENDARTERECTOMY FEMORAL ;  Surgeon: [name], MD;  Location: [facility] OR;  Service: Vascular;  Laterality: Right;  . Patch angioplasty Right 03/25/2013    Procedure: PATCH ANGIOPLASTY USING VASCU-GUARD PERIPHERAL VASCULAR PATCH;  Surgeon: [name], MD;  Location: [facility] OR;  Service: Vascular;  Laterality: Right;  . Amputation Right 04/14/2013    Procedure: AMPUTATION BELOW KNEE ;  Surgeon: [name], MD;  Location: [facility] OR;  Service: Vascular;  Laterality: Right;  . I&d extremity Right 04/14/2013    Procedure: IRRIGATION AND DEBRIDEMENT OF RIGHT  GROIN & PLACEMENT OF VAC DRESSING;  Surgeon: [name], MD;  Location: [facility] OR;  Service: Vascular;  Laterality: Right;  . Abdominal aortagram N/A 04/30/2012    Procedure: ABDOMINAL AORTAGRAM;  Surgeon: [name], MD;  Location: [facility] CATH LAB;  Service: Cardiovascular;  Laterality: N/A;  . Lower extremity angiogram Bilateral 04/30/2012    Procedure: LOWER EXTREMITY ANGIOGRAM;  Surgeon: [name], MD;  Location: [facility] CATH LAB;  Service: Cardiovascular;  Laterality: Bilateral;  bilat lower extrem angio  . Eye surgery Right     Catarct  . Coronary artery bypass graft  03/2000    quadruple  . Hernia repair      Hiatal Hernia  . Wound debridement Right 04/20/2014    s/p  rt bka  with application of wound vac  . I&d extremity Right 04/20/2014    Procedure: DEBRIDEMENT OF RIGHT BELOW KNEE AMPUTATION STUMP;  Surgeon: [name], MD;  Location: [facility] OR;  Service: Vascular;  Laterality: Right;  . Application of wound vac Right 04/20/2014    Procedure: APPLICATION OF WOUND VAC;  Surgeon: [name], MD;  Location: [facility] OR;  Service: Vascular;  Laterality: Right;    FAMILY HISTORY: Family History  Problem Relation Age of Onset  . Heart disease Mother     before age 32  . Diabetes Mother   . Heart attack Mother   . Stroke Mother   . Heart  disease Father     SOCIAL HISTORY: Social History   Social History  . Marital Status: Legally Separated    Spouse Name: N/A  . Number of Children: N/A  . Years of Education: N/A   Occupational History  . Not on file.   Social History Main Topics  . Smoking status: Former Smoker    Types: Cigarettes  . Smokeless tobacco: Never Used     Comment: quit smoking 62yrs ago  . Alcohol Use: No     Comment: occasionally  . Drug Use: No  . Sexual Activity: Not Currently   Other Topics Concern  . Not on file   Social History Narrative     PHYSICAL EXAM  Filed Vitals:   06/21/15 1417  BP: 140/70  Pulse: 65  Height:  (1.753 m)  Weight: 299 lb (135.626 kg)   Body mass index is 44.13 kg/(m^2).  Generalized: Well developed, obese male in no acute distress  Head: normocephalic and atraumatic,. Oropharynx benign , no teeth Neck: Supple, no carotid bruits  Cardiac: Regular rate rhythm, no murmur  Musculoskeletal: No deformity   Neurological examination   Mentation: Alert oriented to time, place, history taking. Attention span and concentration appropriate. Recent and remote memory intact.  Follows all commands speech and language fluent.   Cranial nerve II-XII: Fundoscopic exam deferred..Pupils were equal round reactive to light extraocular movements were full, visual field were full on confrontational test. Facial sensation and strength were normal. hearing was intact to finger rubbing bilaterally. Uvula tongue midline. head turning and shoulder shrug were normal and symmetric.Tongue protrusion into cheek strength was normal. Motor: normal bulk and tone, full strength in the BUE, BLE,has bilateral below knee amputee.with prosthesis Sensory: normal and symmetric to light touch, pinprick, and  Vibration, in upper and lower extremity Coordination: finger-nose-finger, heel-to-shin bilaterally, no dysmetria Reflexes:  1+ upper lower and symmetricplantar responses were flexor  bilaterally. Gait and Station: Bil Below knee amp, ambulates with rolling walker,no difficulty with turns, slow steady gait  DIAGNOSTIC DATA (LABS, IMAGING, TESTING) - I reviewed patient records, labs, notes, testing and imaging myself where  available.  Lab Results  Component Value Date   WBC 6.1 05/23/2015   HGB 15.2 05/23/2015   HCT 44.7 05/23/2015   MCV 87.0 05/23/2015   PLT 162 05/23/2015      Component Value Date/Time   NA 139 05/23/2015 0528   K 4.4 05/23/2015 0528   CL 103 05/23/2015 0528   CO2 22 05/23/2015 0528   GLUCOSE 246* 05/23/2015 0528   BUN 11 05/23/2015 0528   CREATININE 1.23 05/23/2015 0528   CALCIUM 9.1 05/23/2015 0528   PROT 6.2* 05/23/2015 0528   ALBUMIN 2.6* 05/23/2015 0528   AST 16 05/23/2015 0528   ALT 16* 05/23/2015 0528   ALKPHOS 78 05/23/2015 0528   BILITOT 0.4 05/23/2015 0528   GFRNONAA 59* 05/23/2015 0528   GFRAA >60 05/23/2015 0528   Lab Results  Component Value Date   CHOL 209* 05/23/2015   HDL 45 05/23/2015   LDLCALC 142* 05/23/2015   LDLDIRECT 209.0 09/26/2014   TRIG 108 05/23/2015   CHOLHDL 4.6 05/23/2015   Lab Results  Component Value Date   HGBA1C 12.0* 05/23/2015       ASSESSMENT AND PLAN  70 y.o. year old male  has a past medical history of Hyperlipidemia; Ischemic cardiomyopathy; Cataract; Hypotension; Paroxysmal atrial fibrillation (HCC); Peripheral vascular disease (HCC); History of blood clots; Chronic kidney disease; Myocardial infarction (HCC); Atrial flutter (HCC); H/O medication noncompliance; Chronic systolic CHF (congestive heart failure) (HCC); Coronary artery disease; Diabetes mellitus (1979); DVT (deep venous thrombosis) (HCC) (2011); Peripheral neuropathy (HCC); Shortness of breath dyspnea; and Stroke (HCC). here to follow up for recent admission for right brain TIA.CT of the head with chronic involutional change with chronic lacunar infarcts and left cerebellar hemisphere no acute findings. MRI of the head no  acute ischemia MRA of the head severely motion degraded examination probable stenosis of the right cavernous internal carotid artery and right middle cerebral artery. Carotid Doppler no significant stenosis. Echocardiogram EF 25% diffuse hypokinesia. LDL 142 hemoglobin A1c 11.5 The patient is a current patient of Dr. Roda Shutters  who is out of the office today . This note is sent to the work in doctor.      PLAN: Management of ongoing stroke risk factors Continue Xarelto and aspirin for atrial fibrillation and secondary stroke prevention does not have Xarelto Blood pressure control goal 130 or less systolic  140/70 in the office todayContinue metoprolol. And  Lisinopril.  Says he ran out of his lisinopril 2 days ago LDL 142 goal would be 70 continue Crestor 20 mg does not have med Hemoglobin A1c 11.5 goal less than 7 continue insulin and sliding scale monitor blood sugars Low sodium heart healthy diet  Moderate exercise increase gradually Patient counseled on importance of being compliant with his medications I will refill his lisinopril Crestor and Xarelto until he has an opportunity to see his primary care who has been filling these prescriptions in the past.RX for 1 month F/U in 3 monthsVst time 30 min Nilda Riggs, [facility], [facility], APRN  [facility] [address] [place], [place] [address] [phone]

## 2015-06-21 NOTE — Progress Notes (Signed)
I have reviewed and agreed above plan. 

## 2015-06-21 NOTE — Patient Instructions (Signed)
Management stroke risk factors Continue Xarelto and aspirin for atrial fibrillation and secondary stroke prevention Blood pressure control goal 130 or less systolic  140/70 in the office todayContinue metoprolol. And  Lisinopril.  Says he ran out of his lisinopril 2 days ago LDL 142 goal would be 70 continue Crestor 20 mg Hemoglobin A1c 11.5 goal less than 7 continue insulin and sliding scale monitor blood sugars Low sodium heart healthy diet  Moderate exercise increase gradually F/U in 3 months

## 2015-09-21 ENCOUNTER — Telehealth: Payer: Self-pay | Admitting: *Deleted

## 2015-09-21 ENCOUNTER — Encounter: Payer: Self-pay | Admitting: Nurse Practitioner

## 2015-09-21 ENCOUNTER — Ambulatory Visit (INDEPENDENT_AMBULATORY_CARE_PROVIDER_SITE_OTHER): Payer: Commercial Managed Care - HMO | Admitting: Nurse Practitioner

## 2015-09-21 VITALS — BP 122/70 | HR 88 | Ht 69.0 in

## 2015-09-21 DIAGNOSIS — I63411 Cerebral infarction due to embolism of right middle cerebral artery: Secondary | ICD-10-CM

## 2015-09-21 DIAGNOSIS — G459 Transient cerebral ischemic attack, unspecified: Secondary | ICD-10-CM

## 2015-09-21 DIAGNOSIS — I482 Chronic atrial fibrillation, unspecified: Secondary | ICD-10-CM

## 2015-09-21 DIAGNOSIS — E785 Hyperlipidemia, unspecified: Secondary | ICD-10-CM

## 2015-09-21 DIAGNOSIS — E1165 Type 2 diabetes mellitus with hyperglycemia: Secondary | ICD-10-CM

## 2015-09-21 DIAGNOSIS — E118 Type 2 diabetes mellitus with unspecified complications: Secondary | ICD-10-CM | POA: Diagnosis not present

## 2015-09-21 DIAGNOSIS — Z794 Long term (current) use of insulin: Secondary | ICD-10-CM

## 2015-09-21 DIAGNOSIS — IMO0002 Reserved for concepts with insufficient information to code with codable children: Secondary | ICD-10-CM

## 2015-09-21 NOTE — Telephone Encounter (Signed)
I called and spoke to [facility], at [facility].  First Available is 10-05-15 at 1445, arrival 1430.  I relayed verbally and written for pt.  Gave address and telephone #.    Pt drives and cousin with him today also heard this information.

## 2015-09-21 NOTE — Progress Notes (Signed)
[facility]  PATIENT: Leroy [name] DOB: 12/09/1945   REASON FOR VISIT: Follow-up for chronic atrial fib, TIA uncontrolled diabetes, atrial fibrillation HISTORY FROM: Patient and cousin     HISTORY OF PRESENT ILLNESS: Mr. Neels, 70 year old male returns for follow-up. He has a history of TIA February. He has been noncompliant on Xarelto prior to admission. On return visit today he denies further stroke or TIA symptoms. He is still not made an appointment to follow-up with his primary care and is out of his Crestor and Xarelto and lisinopril I refilled at his last visit. He reports blood sugar this morning 190. Lives alone he returns for reevaluation   HISTORY: 06/21/15 CM Leroy [name] is an 70 y.o. male patient who presented to the ER for further evaluation of slurred speech symptoms which started earlier in the afternoon at around 2 PM on 05/22/2015. Patient is a poor historian. He lives alone and he denies any knowledge of symptoms earlier. At around 2 PM on 2/13 he was talking to some people and they noticed slurred speech and left facial droop and hence was sent to the ER for further evaluation. He has poorly controlled diabetes, bilateral above-knee amputations . Denies any new vision problems or any new sensorimotor symptoms in the limbs. Speech symptoms have resolved at this time. Patient does not have any knowledge of facial droop . Patient has history of atrial fibrillation, on anticoagulation with Xarelto, his last dose at 6 PM last night. Patient was not administered TPA secondary symptoms resolved, outside tPA time window, on anticoagulation with Xarelto.. CT of the head with chronic involutional change with chronic lacunar infarcts and left cerebellar hemisphere no acute findings. MRI of the head no acute ischemia MRA of the head severely motion degraded examination probable stenosis of the right cavernous internal carotid artery and right middle cerebral  artery. Carotid Doppler no significant stenosis. Echocardiogram EF 25% diffuse hypokinesia. LDL 142 hemoglobin A1c 11.5 Noncompliant with Xarelto at home. Aspirin 0.81 added with Xarelto. He returns today for follow-up.He has not had further stroke or TIA symptoms He has not followed up with her primary care and he is out of Crestor lisinopril and Xarelto.He returns for reevaluation  REVIEW OF SYSTEMS: Full 14 system review of systems performed and notable only for those listed, all others are neg:  Constitutional: neg  Cardiovascular: neg Ear/Nose/Throat: neg  Skin: neg Eyes: neg Respiratory: neg Gastroitestinal: neg  Hematology/Lymphatic: neg  Endocrine: neg Musculoskeletal:neg Allergy/Immunology: neg Neurological: neg Psychiatric: neg Sleep : neg   ALLERGIES: Allergies  Allergen Reactions  . Statins     Muscle aches  . Amlodipine Besylate     Muscle aches making difficult to walk  . Cephalexin     REACTION: Hives    HOME MEDICATIONS: Outpatient Prescriptions Prior to Visit  Medication Sig Dispense Refill  . aspirin 81 MG tablet Take 81 mg by mouth daily.    [name] ezetimibe (ZETIA) 10 MG tablet Take 1 tablet (10 mg total) by mouth daily. 30 tablet 1  . insulin aspart (NOVOLOG) 100 UNIT/ML injection Inject 5 Units into the skin 3 (three) times daily as needed for high blood sugar. (Patient taking differently: Inject 10 Units into the skin 3 (three) times daily as needed for high blood sugar (over 200). ) 10 mL 11  . insulin glargine (LANTUS) 100 UNIT/ML injection Inject 0.45 mLs (45 Units total) into the skin 2 (two) times daily. (Patient taking differently: Inject 50 Units into the  skin 2 (two) times daily. ) 10 mL 11  . Insulin Syringe-Needle U-100 (B-D INS SYR HALF-UNIT .3CC/31G) 31G X 5/16" 0.3 ML MISC Use 3 times a day with insulin vial 300 each 3  . lisinopril (PRINIVIL,ZESTRIL) 5 MG tablet Take 1 tablet (5 mg total) by mouth daily. 30 tablet 0  . metoprolol succinate  (TOPROL-XL) 50 MG 24 hr tablet Take 1 tablet (50 mg total) by mouth 2 (two) times daily. Take with or immediately following a meal. (Patient taking differently: Take 50 mg by mouth daily. Take with or immediately following a meal.) 180 tablet 3  . nitroGLYCERIN (NITROSTAT) 0.4 MG SL tablet Place 1 tablet (0.4 mg total) under the tongue every 5 (five) minutes as needed for chest pain. 30 tablet 12  . rivaroxaban (XARELTO) 20 MG TABS tablet Take 1 tablet (20 mg total) by mouth daily at 6 PM. 30 tablet 0  . rosuvastatin (CRESTOR) 20 MG tablet Take 1 tablet (20 mg total) by mouth daily at 6 PM. 30 tablet 0  . Canagliflozin-Metformin HCl (INVOKAMET) 50-1000 MG TABS Take 1 tablet twice a day (Patient taking differently: Take 1 tablet by mouth daily. ) 180 tablet 1   No facility-administered medications prior to visit.    PAST MEDICAL HISTORY: Past Medical History  Diagnosis Date  . Hyperlipidemia   . Ischemic cardiomyopathy     a. EF 40% 2010. b. Echo (2/14):  mild LVH, EF 30-35%, diff HK, Gr 1 DD, MAC, mild MR, mod LAE, PASP 39  . Cataract   . Hypotension   . Paroxysmal atrial fibrillation (HCC)   . Peripheral vascular disease (HCC)     a. s/p L BKA 2014.  [name] History of blood clots     in legs--this was in 2011--has been off of Coumadin 83months;takes Xarelto daily  . Joint pain   . H/O hiatal hernia   . Chronic kidney disease     on Lisinopril to protect kidneys d/t being on Metformin per pt  . Myocardial infarction (HCC)     total of 8 heart attacks;last one in 2011  . Atrial flutter (HCC)   . H/O medication noncompliance     Due to insurance issues  . Chronic systolic CHF (congestive heart failure) (HCC)   . Coronary artery disease     a. s/p remote CABG 2001 at [facility] b. Cath 03/2010: for med rx.;  c.  [name] myoview (1/14):  Large Inferior apical MI, very small area of peri-infarct ischemia toward the inferior apical segment. EF 19%.  Med Rx was continued.    . Diabetes mellitus 1979     takes Metformni daily  and Lantus 50units bid  . DVT (deep venous thrombosis) (HCC) 2011    legs  . Peripheral neuropathy (HCC)   . Shortness of breath dyspnea   . Stroke [facility])     PAST SURGICAL HISTORY: Past Surgical History  Procedure Laterality Date  . Revasculariztion  2011/2010    x 2   . Tonsillectomy    . Cardiac catheterization  03/19/10  . Abdominal aortagram  04-30-12  . Adenoidectomy    . Amputation Left 06/03/2012    Procedure: AMPUTATION BELOW KNEE;  Surgeon: [name], MD;  Location: [facility] OR;  Service: Vascular;  Laterality: Left;  . Coronary angioplasty  2002    3 stents  . Endarterectomy femoral Right 03/25/2013    Procedure: ENDARTERECTOMY FEMORAL ;  Surgeon: [name], MD;  Location: [facility] OR;  Service:  Vascular;  Laterality: Right;  . Patch angioplasty Right 03/25/2013    Procedure: PATCH ANGIOPLASTY USING VASCU-GUARD PERIPHERAL VASCULAR PATCH;  Surgeon: [name], MD;  Location: [facility] OR;  Service: Vascular;  Laterality: Right;  . Amputation Right 04/14/2013    Procedure: AMPUTATION BELOW KNEE ;  Surgeon: [name], MD;  Location: [facility] OR;  Service: Vascular;  Laterality: Right;  . I&d extremity Right 04/14/2013    Procedure: IRRIGATION AND DEBRIDEMENT OF RIGHT  GROIN & PLACEMENT OF VAC DRESSING;  Surgeon: [name], MD;  Location: [facility] OR;  Service: Vascular;  Laterality: Right;  . Abdominal aortagram N/A 04/30/2012    Procedure: ABDOMINAL Ronny Flurry;  Surgeon: [name], MD;  Location: [facility] CATH LAB;  Service: Cardiovascular;  Laterality: N/A;  . Lower extremity angiogram Bilateral 04/30/2012    Procedure: LOWER EXTREMITY ANGIOGRAM;  Surgeon: [name], MD;  Location: [facility] CATH LAB;  Service: Cardiovascular;  Laterality: Bilateral;  bilat lower extrem angio  . Eye surgery Right     Catarct  . Coronary artery bypass graft  03/2000    quadruple  . Hernia repair      Hiatal Hernia  . Wound debridement Right 04/20/2014    s/p  rt bka  with application of  wound vac  . I&d extremity Right 04/20/2014    Procedure: DEBRIDEMENT OF RIGHT BELOW KNEE AMPUTATION STUMP;  Surgeon: [name], MD;  Location: [facility] OR;  Service: Vascular;  Laterality: Right;  . Application of wound vac Right 04/20/2014    Procedure: APPLICATION OF WOUND VAC;  Surgeon: [name], MD;  Location: [facility] OR;  Service: Vascular;  Laterality: Right;    FAMILY HISTORY: Family History  Problem Relation Age of Onset  . Heart disease Mother     before age 64  . Diabetes Mother   . Heart attack Mother   . Stroke Mother   . Heart disease Father     SOCIAL HISTORY: Social History   Social History  . Marital Status: Legally Separated    Spouse Name: N/A  . Number of Children: N/A  . Years of Education: N/A   Occupational History  . Not on file.   Social History Main Topics  . Smoking status: Former Smoker    Types: Cigarettes  . Smokeless tobacco: Never Used     Comment: quit smoking 34yrs ago  . Alcohol Use: No     Comment: occasionally  . Drug Use: No  . Sexual Activity: Not Currently   Other Topics Concern  . Not on file   Social History Narrative     PHYSICAL EXAM  Filed Vitals:   09/21/15 1507  Height:  (1.753 m)   There is no weight on file to calculate BMI. Generalized: Well developed, obese male in no acute distress  Head: normocephalic and atraumatic,. Oropharynx benign , no teeth Neck: Supple, no carotid bruits  Cardiac: Regular rate rhythm, no murmur  Musculoskeletal: No deformity   Neurological examination   Mentation: Alert oriented to time, place, history taking. Attention span and concentration appropriate. Recent and remote memory intact. Follows all commands speech and language fluent.   Cranial nerve II-XII: Fundoscopic exam deferred..Pupils were equal round reactive to light extraocular movements were full, visual field were full on confrontational test. Facial sensation and strength were normal. hearing was intact to  finger rubbing bilaterally. Uvula tongue midline. head turning and shoulder shrug were normal and symmetric.Tongue protrusion into cheek strength was normal.  Motor: normal bulk and tone, full strength in the BUE, BLE,has bilateral below knee amputee.with prosthesis Sensory: normal and symmetric to light touch, pinprick, and Vibration, in upper extremity Coordination: finger-nose-finger,  no dysmetria Gait and Station: Bil Below knee amp, in wheelchair not ambulated   DIAGNOSTIC DATA (LABS, IMAGING, TESTING) - I reviewed patient records, labs, notes, testing and imaging myself where available.  Lab Results  Component Value Date   WBC 6.1 05/23/2015   HGB 15.2 05/23/2015   HCT 44.7 05/23/2015   MCV 87.0 05/23/2015   PLT 162 05/23/2015      Component Value Date/Time   NA 139 05/23/2015 0528   K 4.4 05/23/2015 0528   CL 103 05/23/2015 0528   CO2 22 05/23/2015 0528   GLUCOSE 246* 05/23/2015 0528   BUN 11 05/23/2015 0528   CREATININE 1.23 05/23/2015 0528   CALCIUM 9.1 05/23/2015 0528   PROT 6.2* 05/23/2015 0528   ALBUMIN 2.6* 05/23/2015 0528   AST 16 05/23/2015 0528   ALT 16* 05/23/2015 0528   ALKPHOS 78 05/23/2015 0528   BILITOT 0.4 05/23/2015 0528   GFRNONAA 59* 05/23/2015 0528   GFRAA >60 05/23/2015 0528   Lab Results  Component Value Date   CHOL 209* 05/23/2015   HDL 45 05/23/2015   LDLCALC 142* 05/23/2015   LDLDIRECT 209.0 09/26/2014   TRIG 108 05/23/2015   CHOLHDL 4.6 05/23/2015   Lab Results  Component Value Date   HGBA1C 12.0* 05/23/2015    ASSESSMENT AND PLAN 70 y.o. year old male has a past medical history of Hyperlipidemia; Ischemic cardiomyopathy; Cataract; Hypotension; Paroxysmal atrial fibrillation (HCC); Peripheral vascular disease (HCC); History of blood clots; Chronic kidney disease; Myocardial infarction (HCC); Atrial flutter (HCC); H/O medication noncompliance; Chronic systolic CHF (congestive heart failure) (HCC); Coronary artery disease; Diabetes  mellitus (1979); DVT (deep venous thrombosis) (HCC) (2011); Peripheral neuropathy (HCC); Shortness of breath dyspnea; and Stroke (HCC). here to follow up for recent admission for right brain TIA.CT of the head with chronic involutional change with chronic lacunar infarcts and left cerebellar hemisphere no acute findings. MRI of the head no acute ischemia MRA of the head severely motion degraded examination probable stenosis of the right cavernous internal carotid artery and right middle cerebral artery. Carotid Doppler no significant stenosis. Echocardiogram EF 25% diffuse hypokinesia. LDL 142 hemoglobin A1c 11.5   PLAN: Management of ongoing stroke risk factors Continue Xarelto and aspirin for atrial fibrillation and secondary stroke prevention  Blood pressure control goal 130 or less systolic 122/70 in the office today Continue metoprolol. And Lisinopril.  LDL 142 goal would be 70 continue Crestor 20 mg Hemoglobin A1c 11.5 goal less than 7 continue insulin and sliding scale monitor blood sugars Low sodium heart healthy diet  Moderate exercise increase gradually Patient counseled on importance of being compliant with his medications and making follow up appts with PCP Dr. Yetta Barre Appt made by our office for 10/05/15 at 230pm Nilda Riggs, [facility], [facility], APRN  [facility] [address] [place], [place] [address] [phone]

## 2015-09-21 NOTE — Patient Instructions (Signed)
PLAN: Management of ongoing stroke risk factors Continue Xarelto and aspirin for atrial fibrillation and secondary stroke prevention does not have Xarelto Blood pressure control goal 130 or less systolic 122/70 in the office todayContinue metoprolol. And Lisinopril.  LDL 142 goal would be 70 continue Crestor 20 mg Hemoglobin A1c 11.5 goal less than 7 continue insulin and sliding scale monitor blood sugars Low sodium heart healthy diet  Moderate exercise increase gradually Patient counseled on importance of being compliant with his medications and making follow up appts with PCP Dr. Yetta Barre

## 2015-09-22 NOTE — Progress Notes (Signed)
I reviewed above note and agree with the assessment and plan.  Marvel Plan, MD PhD Stroke Neurology 09/22/2015 6:35 AM

## 2015-10-05 ENCOUNTER — Encounter: Payer: Commercial Managed Care - HMO | Admitting: Internal Medicine

## 2015-10-22 ENCOUNTER — Emergency Department (HOSPITAL_COMMUNITY): Payer: Commercial Managed Care - HMO

## 2015-10-22 ENCOUNTER — Inpatient Hospital Stay (HOSPITAL_COMMUNITY)
Admission: EM | Admit: 2015-10-22 | Discharge: 2015-10-29 | DRG: 394 | Disposition: A | Discharge status: Home / Self Care | Payer: Commercial Managed Care - HMO | Attending: Internal Medicine | Admitting: Internal Medicine

## 2015-10-22 ENCOUNTER — Encounter (HOSPITAL_COMMUNITY): Payer: Self-pay | Admitting: Emergency Medicine

## 2015-10-22 DIAGNOSIS — K55049 Acute infarction of large intestine, extent unspecified: Secondary | ICD-10-CM | POA: Diagnosis not present

## 2015-10-22 DIAGNOSIS — Z833 Family history of diabetes mellitus: Secondary | ICD-10-CM

## 2015-10-22 DIAGNOSIS — I255 Ischemic cardiomyopathy: Secondary | ICD-10-CM | POA: Diagnosis present

## 2015-10-22 DIAGNOSIS — Z823 Family history of stroke: Secondary | ICD-10-CM

## 2015-10-22 DIAGNOSIS — E1122 Type 2 diabetes mellitus with diabetic chronic kidney disease: Secondary | ICD-10-CM | POA: Diagnosis present

## 2015-10-22 DIAGNOSIS — E871 Hypo-osmolality and hyponatremia: Secondary | ICD-10-CM | POA: Diagnosis present

## 2015-10-22 DIAGNOSIS — I2510 Atherosclerotic heart disease of native coronary artery without angina pectoris: Secondary | ICD-10-CM | POA: Diagnosis present

## 2015-10-22 DIAGNOSIS — Z7901 Long term (current) use of anticoagulants: Secondary | ICD-10-CM | POA: Diagnosis not present

## 2015-10-22 DIAGNOSIS — K529 Noninfective gastroenteritis and colitis, unspecified: Secondary | ICD-10-CM | POA: Diagnosis not present

## 2015-10-22 DIAGNOSIS — N179 Acute kidney failure, unspecified: Secondary | ICD-10-CM | POA: Diagnosis present

## 2015-10-22 DIAGNOSIS — R197 Diarrhea, unspecified: Secondary | ICD-10-CM | POA: Diagnosis not present

## 2015-10-22 DIAGNOSIS — I252 Old myocardial infarction: Secondary | ICD-10-CM | POA: Diagnosis not present

## 2015-10-22 DIAGNOSIS — E119 Type 2 diabetes mellitus without complications: Secondary | ICD-10-CM | POA: Diagnosis not present

## 2015-10-22 DIAGNOSIS — I130 Hypertensive heart and chronic kidney disease with heart failure and stage 1 through stage 4 chronic kidney disease, or unspecified chronic kidney disease: Secondary | ICD-10-CM | POA: Diagnosis present

## 2015-10-22 DIAGNOSIS — I480 Paroxysmal atrial fibrillation: Secondary | ICD-10-CM | POA: Diagnosis present

## 2015-10-22 DIAGNOSIS — Z89512 Acquired absence of left leg below knee: Secondary | ICD-10-CM | POA: Diagnosis not present

## 2015-10-22 DIAGNOSIS — R140 Abdominal distension (gaseous): Secondary | ICD-10-CM

## 2015-10-22 DIAGNOSIS — R52 Pain, unspecified: Secondary | ICD-10-CM

## 2015-10-22 DIAGNOSIS — K5090 Crohn's disease, unspecified, without complications: Secondary | ICD-10-CM | POA: Diagnosis present

## 2015-10-22 DIAGNOSIS — Z89511 Acquired absence of right leg below knee: Secondary | ICD-10-CM

## 2015-10-22 DIAGNOSIS — E860 Dehydration: Secondary | ICD-10-CM | POA: Diagnosis present

## 2015-10-22 DIAGNOSIS — K5289 Other specified noninfective gastroenteritis and colitis: Secondary | ICD-10-CM | POA: Diagnosis not present

## 2015-10-22 DIAGNOSIS — I5042 Chronic combined systolic (congestive) and diastolic (congestive) heart failure: Secondary | ICD-10-CM | POA: Diagnosis present

## 2015-10-22 DIAGNOSIS — E785 Hyperlipidemia, unspecified: Secondary | ICD-10-CM | POA: Diagnosis present

## 2015-10-22 DIAGNOSIS — E1151 Type 2 diabetes mellitus with diabetic peripheral angiopathy without gangrene: Secondary | ICD-10-CM | POA: Diagnosis present

## 2015-10-22 DIAGNOSIS — R1031 Right lower quadrant pain: Secondary | ICD-10-CM | POA: Diagnosis present

## 2015-10-22 DIAGNOSIS — I471 Supraventricular tachycardia: Secondary | ICD-10-CM | POA: Diagnosis present

## 2015-10-22 DIAGNOSIS — R109 Unspecified abdominal pain: Secondary | ICD-10-CM | POA: Diagnosis not present

## 2015-10-22 DIAGNOSIS — Z794 Long term (current) use of insulin: Secondary | ICD-10-CM | POA: Diagnosis not present

## 2015-10-22 DIAGNOSIS — Z7982 Long term (current) use of aspirin: Secondary | ICD-10-CM

## 2015-10-22 DIAGNOSIS — Z8249 Family history of ischemic heart disease and other diseases of the circulatory system: Secondary | ICD-10-CM

## 2015-10-22 DIAGNOSIS — K559 Vascular disorder of intestine, unspecified: Principal | ICD-10-CM | POA: Diagnosis present

## 2015-10-22 DIAGNOSIS — Z8673 Personal history of transient ischemic attack (TIA), and cerebral infarction without residual deficits: Secondary | ICD-10-CM

## 2015-10-22 DIAGNOSIS — Z951 Presence of aortocoronary bypass graft: Secondary | ICD-10-CM | POA: Diagnosis not present

## 2015-10-22 DIAGNOSIS — Z87891 Personal history of nicotine dependence: Secondary | ICD-10-CM | POA: Diagnosis not present

## 2015-10-22 DIAGNOSIS — R0602 Shortness of breath: Secondary | ICD-10-CM | POA: Diagnosis not present

## 2015-10-22 DIAGNOSIS — R112 Nausea with vomiting, unspecified: Secondary | ICD-10-CM | POA: Diagnosis not present

## 2015-10-22 DIAGNOSIS — Z955 Presence of coronary angioplasty implant and graft: Secondary | ICD-10-CM

## 2015-10-22 DIAGNOSIS — Z6841 Body Mass Index (BMI) 40.0 and over, adult: Secondary | ICD-10-CM

## 2015-10-22 DIAGNOSIS — R933 Abnormal findings on diagnostic imaging of other parts of digestive tract: Secondary | ICD-10-CM | POA: Diagnosis not present

## 2015-10-22 DIAGNOSIS — N189 Chronic kidney disease, unspecified: Secondary | ICD-10-CM | POA: Diagnosis present

## 2015-10-22 DIAGNOSIS — K6389 Other specified diseases of intestine: Secondary | ICD-10-CM | POA: Diagnosis not present

## 2015-10-22 DIAGNOSIS — Z79899 Other long term (current) drug therapy: Secondary | ICD-10-CM

## 2015-10-22 DIAGNOSIS — K633 Ulcer of intestine: Secondary | ICD-10-CM | POA: Diagnosis not present

## 2015-10-22 DIAGNOSIS — R1084 Generalized abdominal pain: Secondary | ICD-10-CM | POA: Diagnosis not present

## 2015-10-22 DIAGNOSIS — I482 Chronic atrial fibrillation: Secondary | ICD-10-CM | POA: Diagnosis not present

## 2015-10-22 LAB — LIPASE, BLOOD: LIPASE: 14 U/L (ref 11–51)

## 2015-10-22 LAB — CBC
HCT: 48.2 % (ref 39.0–52.0)
HEMOGLOBIN: 17 g/dL (ref 13.0–17.0)
MCH: 30.8 pg (ref 26.0–34.0)
MCHC: 35.3 g/dL (ref 30.0–36.0)
MCV: 87.3 fL (ref 78.0–100.0)
PLATELETS: 184 10*3/uL (ref 150–400)
RBC: 5.52 MIL/uL (ref 4.22–5.81)
RDW: 13.1 % (ref 11.5–15.5)
WBC: 15.1 10*3/uL — AB (ref 4.0–10.5)

## 2015-10-22 LAB — BASIC METABOLIC PANEL
Anion gap: 7 (ref 5–15)
BUN: 12 mg/dL (ref 6–20)
CALCIUM: 8.9 mg/dL (ref 8.9–10.3)
CO2: 28 mmol/L (ref 22–32)
CREATININE: 1.26 mg/dL — AB (ref 0.61–1.24)
Chloride: 98 mmol/L — ABNORMAL LOW (ref 101–111)
GFR calc Af Amer: >60 mL/min (ref >60)
GFR, EST NON AFRICAN AMERICAN: 57 mL/min — AB (ref >60)
GLUCOSE: 260 mg/dL — AB (ref 65–99)
Potassium: 4.1 mmol/L (ref 3.5–5.1)
SODIUM: 133 mmol/L — AB (ref 135–145)

## 2015-10-22 LAB — BRAIN NATRIURETIC PEPTIDE: B NATRIURETIC PEPTIDE 5: 147.3 pg/mL — AB (ref 0.0–100.0)

## 2015-10-22 MED ORDER — IOPAMIDOL (ISOVUE-300) INJECTION 61%
100.0000 mL | Freq: Once | INTRAVENOUS | Status: AC | PRN
  Administered 2015-10-22: 100 mL via INTRAVENOUS

## 2015-10-22 MED ORDER — MORPHINE SULFATE (PF) 4 MG/ML IV SOLN: 4.0000 mg | Freq: Once | INTRAVENOUS | Status: DC

## 2015-10-22 MED ORDER — MORPHINE SULFATE (PF) 4 MG/ML IV SOLN
4.0000 mg | Freq: Once | INTRAVENOUS | Status: AC
  Administered 2015-10-22: 4 mg via INTRAVENOUS
  Filled 2015-10-22: qty 1

## 2015-10-22 NOTE — ED Notes (Signed)
Aware we need urine 

## 2015-10-22 NOTE — ED Provider Notes (Signed)
CSN: [id number]     Arrival date & time 10/22/15  1801 History   First MD Initiated Contact with Patient 10/22/15 1836     Chief Complaint  Patient presents with  . Abdominal Pain  . Emesis     (Consider location/radiation/quality/duration/timing/severity/associated sxs/prior Treatment) HPI Comments: Patient with PMH of DM, HTN, HL, CAD, CHF, CKD, presents to the ED with a chief complaint of RLQ abdominal pain.  Patient states that the symptoms started 3 days ago.  States that that pain has been gradually worsening.  He reports having associated vomiting and diarrhea.    He has not taken anything for his symptoms.  He is uncertain whether he has run a fever. Additionally, patient reports having some shortness of breath.  The history is provided by the patient. No language interpreter was used.    Past Medical History  Diagnosis Date  . Hyperlipidemia   . Ischemic cardiomyopathy     a. EF 40% 2010. b. Echo (2/14):  mild LVH, EF 30-35%, diff HK, Gr 1 DD, MAC, mild MR, mod LAE, PASP 39  . Cataract   . Hypotension   . Paroxysmal atrial fibrillation (HCC)   . Peripheral vascular disease (HCC)     a. s/p L BKA 2014.  [name] History of blood clots     in legs--this was in 2011--has been off of Coumadin 60months;takes Xarelto daily  . Joint pain   . H/O hiatal hernia   . Chronic kidney disease     on Lisinopril to protect kidneys d/t being on Metformin per pt  . Myocardial infarction (HCC)     total of 8 heart attacks;last one in 2011  . Atrial flutter (HCC)   . H/O medication noncompliance     Due to insurance issues  . Chronic systolic CHF (congestive heart failure) (HCC)   . Coronary artery disease     a. s/p remote CABG 2001 at [facility] b. Cath 03/2010: for med rx.;  c.  [name] myoview (1/14):  Large Inferior apical MI, very small area of peri-infarct ischemia toward the inferior apical segment. EF 19%.  Med Rx was continued.    . Diabetes mellitus 1979    takes Metformni daily  and  Lantus 50units bid  . DVT (deep venous thrombosis) (HCC) 2011    legs  . Peripheral neuropathy (HCC)   . Shortness of breath dyspnea   . Stroke [facility])    Past Surgical History  Procedure Laterality Date  . Revasculariztion  2011/2010    x 2   . Tonsillectomy    . Cardiac catheterization  03/19/10  . Abdominal aortagram  04-30-12  . Adenoidectomy    . Amputation Left 06/03/2012    Procedure: AMPUTATION BELOW KNEE;  Surgeon: [name], MD;  Location: [facility] OR;  Service: Vascular;  Laterality: Left;  . Coronary angioplasty  2002    3 stents  . Endarterectomy femoral Right 03/25/2013    Procedure: ENDARTERECTOMY FEMORAL ;  Surgeon: [name], MD;  Location: [facility] OR;  Service: Vascular;  Laterality: Right;  . Patch angioplasty Right 03/25/2013    Procedure: PATCH ANGIOPLASTY USING VASCU-GUARD PERIPHERAL VASCULAR PATCH;  Surgeon: [name], MD;  Location: [facility] OR;  Service: Vascular;  Laterality: Right;  . Amputation Right 04/14/2013    Procedure: AMPUTATION BELOW KNEE ;  Surgeon: [name], MD;  Location: [facility] OR;  Service: Vascular;  Laterality: Right;  . I&d extremity Right 04/14/2013    Procedure: IRRIGATION AND  DEBRIDEMENT OF RIGHT  GROIN & PLACEMENT OF VAC DRESSING;  Surgeon: [name], MD;  Location: [facility] OR;  Service: Vascular;  Laterality: Right;  . Abdominal aortagram N/A 04/30/2012    Procedure: ABDOMINAL Ronny Flurry;  Surgeon: [name], MD;  Location: [facility] CATH LAB;  Service: Cardiovascular;  Laterality: N/A;  . Lower extremity angiogram Bilateral 04/30/2012    Procedure: LOWER EXTREMITY ANGIOGRAM;  Surgeon: [name], MD;  Location: [facility] CATH LAB;  Service: Cardiovascular;  Laterality: Bilateral;  bilat lower extrem angio  . Eye surgery Right     Catarct  . Coronary artery bypass graft  03/2000    quadruple  . Hernia repair      Hiatal Hernia  . Wound debridement Right 04/20/2014    s/p  rt bka  with application of wound vac  . I&d extremity Right 04/20/2014    Procedure:  DEBRIDEMENT OF RIGHT BELOW KNEE AMPUTATION STUMP;  Surgeon: [name], MD;  Location: [facility] OR;  Service: Vascular;  Laterality: Right;  . Application of wound vac Right 04/20/2014    Procedure: APPLICATION OF WOUND VAC;  Surgeon: [name], MD;  Location: [facility] OR;  Service: Vascular;  Laterality: Right;   Family History  Problem Relation Age of Onset  . Heart disease Mother     before age 88  . Diabetes Mother   . Heart attack Mother   . Stroke Mother   . Heart disease Father    Social History  Substance Use Topics  . Smoking status: Former Smoker    Types: Cigarettes  . Smokeless tobacco: Never Used     Comment: quit smoking 39yrs ago  . Alcohol Use: No     Comment: occasionally    Review of Systems  Respiratory: Positive for shortness of breath.   Gastrointestinal: Positive for nausea, vomiting, abdominal pain and diarrhea.  All other systems reviewed and are negative.     Allergies  Statins; Amlodipine besylate; and Cephalexin  Home Medications   Prior to Admission medications   Medication Sig Start Date End Date Taking? Authorizing Provider  aspirin 81 MG tablet Take 81 mg by mouth daily.    Historical Provider, MD  Canagliflozin-Metformin HCl 50-1000 MG TABS Take by mouth 2 (two) times daily.    Historical Provider, MD  ezetimibe (ZETIA) 10 MG tablet Take 1 tablet (10 mg total) by mouth daily. 05/07/13   Mcarthur Rossetti Angiulli, PA-C  insulin aspart (NOVOLOG) 100 UNIT/ML injection Inject 5 Units into the skin 3 (three) times daily as needed for high blood sugar. Patient taking differently: Inject 10 Units into the skin 3 (three) times daily as needed for high blood sugar (over 200).  09/26/14   Etta Grandchild, MD  insulin glargine (LANTUS) 100 UNIT/ML injection Inject 0.45 mLs (45 Units total) into the skin 2 (two) times daily. Patient taking differently: Inject 50 Units into the skin 2 (two) times daily.  09/26/14   Etta Grandchild, MD  Insulin Syringe-Needle U-100 (B-D INS  SYR HALF-UNIT .3CC/31G) 31G X 5/16" 0.3 ML MISC Use 3 times a day with insulin vial 10/07/14   Etta Grandchild, MD  lisinopril (PRINIVIL,ZESTRIL) 5 MG tablet Take 1 tablet (5 mg total) by mouth daily. 06/21/15   Nilda Riggs, NP  metoprolol succinate (TOPROL-XL) 50 MG 24 hr tablet Take 1 tablet (50 mg total) by mouth 2 (two) times daily. Take with or immediately following a meal. Patient taking differently: Take 50 mg by  mouth daily. Take with or immediately following a meal. 09/28/14   Etta Grandchild, MD  nitroGLYCERIN (NITROSTAT) 0.4 MG SL tablet Place 1 tablet (0.4 mg total) under the tongue every 5 (five) minutes as needed for chest pain. 05/07/13   Mcarthur Rossetti Angiulli, PA-C  rivaroxaban (XARELTO) 20 MG TABS tablet Take 1 tablet (20 mg total) by mouth daily at 6 PM. 06/21/15   Nilda Riggs, NP  rosuvastatin (CRESTOR) 20 MG tablet Take 1 tablet (20 mg total) by mouth daily at 6 PM. 06/21/15   Nilda Riggs, NP   BP 136/115 mmHg  Pulse 102  Temp(Src) 98.6 F (37 C) (Oral)  Resp 18  SpO2 97% Physical Exam  Constitutional: He is oriented to person, place, and time. He appears well-developed and well-nourished.  HENT:  Head: Normocephalic and atraumatic.  Eyes: Conjunctivae and EOM are normal. Pupils are equal, round, and reactive to light. Right eye exhibits no discharge. Left eye exhibits no discharge. No scleral icterus.  Neck: Normal range of motion. Neck supple. No JVD present.  Cardiovascular: Regular rhythm and normal heart sounds.  Exam reveals no gallop and no friction rub.   No murmur heard. Mild tachycardia  Pulmonary/Chest: Effort normal and breath sounds normal. No respiratory distress. He has no wheezes. He has no rales. He exhibits no tenderness.  Abdominal: Soft. He exhibits no distension and no mass. There is tenderness. There is no rebound and no guarding.  Tenderness to palpation of RLQ  Musculoskeletal: Normal range of motion. He exhibits no edema or  tenderness.  Neurological: He is alert and oriented to person, place, and time.  Skin: Skin is warm and dry.  Psychiatric: He has a normal mood and affect. His behavior is normal. Judgment and thought content normal.  Nursing note and vitals reviewed.   ED Course  Procedures (including critical care time) Results for orders placed or performed during the hospital encounter of 10/22/15  CBC  Result Value Ref Range   WBC 15.1 (H) 4.0 - 10.5 K/uL   RBC 5.52 4.22 - 5.81 MIL/uL   Hemoglobin 17.0 13.0 - 17.0 g/dL   HCT 69.6 29.5 - 28.4 %   MCV 87.3 78.0 - 100.0 fL   MCH 30.8 26.0 - 34.0 pg   MCHC 35.3 30.0 - 36.0 g/dL   RDW 13.2 44.0 - 10.2 %   Platelets 184 150 - 400 K/uL  Lipase, blood  Result Value Ref Range   Lipase 14 11 - 51 U/L  Brain natriuretic peptide  Result Value Ref Range   B Natriuretic Peptide 147.3 (H) 0.0 - 100.0 pg/mL  Basic metabolic panel  Result Value Ref Range   Sodium 133 (L) 135 - 145 mmol/L   Potassium 4.1 3.5 - 5.1 mmol/L   Chloride 98 (L) 101 - 111 mmol/L   CO2 28 22 - 32 mmol/L   Glucose, Bld 260 (H) 65 - 99 mg/dL   BUN 12 6 - 20 mg/dL   Creatinine, Ser 7.25 (H) 0.61 - 1.24 mg/dL   Calcium 8.9 8.9 - 36.6 mg/dL   GFR calc non Af Amer 57 (L) >60 mL/min   GFR calc Af Amer >60 >60 mL/min   Anion gap 7 5 - 15   Dg Chest 2 View  10/22/2015  CLINICAL DATA:  Shortness of breath. EXAM: CHEST  2 VIEW COMPARISON:  April 16, 2013 FINDINGS: The heart size borderline. No pulmonary nodules, masses, or infiltrates. No pneumothorax. IMPRESSION: No active cardiopulmonary disease.  Electronically Signed   By: Gerome Sam III M.D   On: 10/22/2015 20:56   Ct Abdomen Pelvis W Contrast  10/22/2015  CLINICAL DATA:  Right lower quadrant pain. EXAM: CT ABDOMEN AND PELVIS WITH CONTRAST TECHNIQUE: Multidetector CT imaging of the abdomen and pelvis was performed using the standard protocol following bolus administration of intravenous contrast. CONTRAST:  ISOVUE-300  IOPAMIDOL (ISOVUE-300) INJECTION 61% COMPARISON:  None. FINDINGS: Lower chest and abdominal wall: Remote left ventricular infarct with subendocardial scarring along the septum. Hepatobiliary: No focal liver abnormality.No evidence of biliary obstruction or stone. Pancreas: Unremarkable. Spleen: Unremarkable. Adrenals/Urinary Tract: Negative adrenals. Patchy renal cortical scarring, right worse than left. No hydronephrosis or ureteral calculus. Unremarkable bladder. Stomach/Bowel: Dilated ileal loops containing fluid and fecalized contents with mild mesenteric congestion. Transition point the terminal ileum there is borderline wall thickening. No causative mass is seen. Extensive atherosclerosis, without visible or proximal occlusion; affected segments have enhancing walls. No signs of bowel necrosis. Few uncomplicated colonic diverticula. Distal colonic diverticulum. Reproductive:No pathologic findings. Vascular/Lymphatic: Extensive aortic and branch vessel atherosclerosis. Remote vascular surgery in the bilateral groin. Poor or no enhancement at the level of the right left SFA. Vascular evaluation is limited by contrast timing. No acute visceral artery finding. Other: No ascites or pneumoperitoneum. Musculoskeletal: No acute abnormalities. Advanced lower lumbar facet arthropathy. Sacroiliac ankylosis with spurring. IMPRESSION: 1. Partial ileal obstruction without definitive underlying cause. Questionable regional enteritis. Patient has severe atherosclerosis, correlate with lactate. 2. Poor or no enhancement at the bilateral SFA. Remote left ventricular infarct. 3. Bilateral renal cortical scarring. Electronically Signed   By: Marnee Spring M.D.   On: 10/22/2015 23:34    I have personally reviewed and evaluated these images and lab results as part of my medical decision-making.   EKG Interpretation   Date/Time:  Sunday October 22 2015 23:45:02 EDT Ventricular Rate:  155 PR Interval:    QRS Duration:  126 QT Interval:  314 QTC Calculation: 505 R Axis:   99 Text Interpretation:  Wide-QRS tachycardia Nonspecific intraventricular  conduction delay wide based tachycardia Confirmed by Corlis Leak, COURTNEY  [phone]) on 10/23/2015 12:04:36 AM      MDM   Final diagnoses:  Colitis  Diarrhea, unspecified type  Abdominal pain, unspecified abdominal location  SVT (supraventricular tachycardia) (HCC)    Patient with RLQ abdominal pain.  Afebrile.  Tender to palpation in RLQ.  Will check CT to rule out appy.  Patient also has hx of CHF.  Will check BNP and CXR because he has been slightly short of breath.  CT pending.  Initially ordered as non-con due to inability to gain IV access, but switched to with contrast after obtaining access.  1134 PM  CT results.  Discussed with Dr. Corlis Leak.  Reassessed patient and noted to be extremely tachycardic.  No CP.  Does have some SOB.  EKG reviewed with Dr. Corlis Leak.  Patient seen by and discussed with Dr. Corlis Leak.  Recommends giving a liter of fluid.  Consider adenosine.  Suspect patient is getting septic.  Possibly mesenteric ischemia.  Pads placed.  Patient still really only complaining of abdominal pain and some SOB.  No chest pain.  Speaking, doesn't appear to be in distress.  Lactic is 2.4.   12:57 AM Patient given 6 of adenosine, and then 12 of adenosine.  Converts to sinus tach.  Upon further review, patient does have a hx of SVT.  Patient rates his abdominal pain as a 4/10.  He has been having diarrhea  for the past 3 day.  Suspicion for ischemic bowel is low.  Dr. Corlis Leak has been directly involved in the care of this patient and agrees that suspicion for ischemic bowel is low.  Recommends admission to medicine.  1:35 AM Dr. Corlis Leak, discussed with Dr. Julian Reil, who will admit.    Roxy Horseman, PA-C 10/23/15 0135  Courteney Randall An, MD 10/28/15 [phone]

## 2015-10-22 NOTE — ED Notes (Signed)
Bed: ZO10 Expected date:  Expected time:  Means of arrival:  Comments: 70 yo N/V/D x3 days

## 2015-10-22 NOTE — ED Notes (Signed)
Pt c/o n/v RLQ abdominal pain, diarrhea, onset Friday. Hx DM, not taken Lantus, Invokana x 3 days.

## 2015-10-23 ENCOUNTER — Inpatient Hospital Stay (HOSPITAL_COMMUNITY): Payer: Commercial Managed Care - HMO

## 2015-10-23 ENCOUNTER — Encounter (HOSPITAL_COMMUNITY): Payer: Self-pay

## 2015-10-23 DIAGNOSIS — Z87891 Personal history of nicotine dependence: Secondary | ICD-10-CM | POA: Diagnosis not present

## 2015-10-23 DIAGNOSIS — I255 Ischemic cardiomyopathy: Secondary | ICD-10-CM | POA: Diagnosis present

## 2015-10-23 DIAGNOSIS — R1031 Right lower quadrant pain: Secondary | ICD-10-CM | POA: Diagnosis present

## 2015-10-23 DIAGNOSIS — Z7901 Long term (current) use of anticoagulants: Secondary | ICD-10-CM | POA: Diagnosis not present

## 2015-10-23 DIAGNOSIS — E871 Hypo-osmolality and hyponatremia: Secondary | ICD-10-CM | POA: Diagnosis present

## 2015-10-23 DIAGNOSIS — I480 Paroxysmal atrial fibrillation: Secondary | ICD-10-CM | POA: Diagnosis present

## 2015-10-23 DIAGNOSIS — R112 Nausea with vomiting, unspecified: Secondary | ICD-10-CM | POA: Diagnosis present

## 2015-10-23 DIAGNOSIS — E1151 Type 2 diabetes mellitus with diabetic peripheral angiopathy without gangrene: Secondary | ICD-10-CM | POA: Diagnosis present

## 2015-10-23 DIAGNOSIS — N189 Chronic kidney disease, unspecified: Secondary | ICD-10-CM | POA: Diagnosis present

## 2015-10-23 DIAGNOSIS — Z8673 Personal history of transient ischemic attack (TIA), and cerebral infarction without residual deficits: Secondary | ICD-10-CM | POA: Diagnosis not present

## 2015-10-23 DIAGNOSIS — E785 Hyperlipidemia, unspecified: Secondary | ICD-10-CM | POA: Diagnosis present

## 2015-10-23 DIAGNOSIS — I482 Chronic atrial fibrillation: Secondary | ICD-10-CM | POA: Diagnosis not present

## 2015-10-23 DIAGNOSIS — Z89512 Acquired absence of left leg below knee: Secondary | ICD-10-CM | POA: Diagnosis not present

## 2015-10-23 DIAGNOSIS — I252 Old myocardial infarction: Secondary | ICD-10-CM | POA: Diagnosis not present

## 2015-10-23 DIAGNOSIS — K5090 Crohn's disease, unspecified, without complications: Secondary | ICD-10-CM | POA: Diagnosis present

## 2015-10-23 DIAGNOSIS — R933 Abnormal findings on diagnostic imaging of other parts of digestive tract: Secondary | ICD-10-CM | POA: Diagnosis not present

## 2015-10-23 DIAGNOSIS — E860 Dehydration: Secondary | ICD-10-CM | POA: Diagnosis present

## 2015-10-23 DIAGNOSIS — I2510 Atherosclerotic heart disease of native coronary artery without angina pectoris: Secondary | ICD-10-CM | POA: Diagnosis present

## 2015-10-23 DIAGNOSIS — E1122 Type 2 diabetes mellitus with diabetic chronic kidney disease: Secondary | ICD-10-CM | POA: Diagnosis present

## 2015-10-23 DIAGNOSIS — Z794 Long term (current) use of insulin: Secondary | ICD-10-CM | POA: Diagnosis not present

## 2015-10-23 DIAGNOSIS — Z951 Presence of aortocoronary bypass graft: Secondary | ICD-10-CM | POA: Diagnosis not present

## 2015-10-23 DIAGNOSIS — Z6841 Body Mass Index (BMI) 40.0 and over, adult: Secondary | ICD-10-CM | POA: Diagnosis not present

## 2015-10-23 DIAGNOSIS — I5042 Chronic combined systolic (congestive) and diastolic (congestive) heart failure: Secondary | ICD-10-CM | POA: Diagnosis present

## 2015-10-23 DIAGNOSIS — I471 Supraventricular tachycardia: Secondary | ICD-10-CM | POA: Diagnosis present

## 2015-10-23 DIAGNOSIS — N179 Acute kidney failure, unspecified: Secondary | ICD-10-CM | POA: Diagnosis present

## 2015-10-23 DIAGNOSIS — K559 Vascular disorder of intestine, unspecified: Secondary | ICD-10-CM | POA: Diagnosis present

## 2015-10-23 DIAGNOSIS — I130 Hypertensive heart and chronic kidney disease with heart failure and stage 1 through stage 4 chronic kidney disease, or unspecified chronic kidney disease: Secondary | ICD-10-CM | POA: Diagnosis present

## 2015-10-23 DIAGNOSIS — Z89511 Acquired absence of right leg below knee: Secondary | ICD-10-CM | POA: Diagnosis not present

## 2015-10-23 LAB — BASIC METABOLIC PANEL
ANION GAP: 10 (ref 5–15)
BUN: 16 mg/dL (ref 6–20)
CHLORIDE: 101 mmol/L (ref 101–111)
CO2: 23 mmol/L (ref 22–32)
Calcium: 8.3 mg/dL — ABNORMAL LOW (ref 8.9–10.3)
Creatinine, Ser: 1.44 mg/dL — ABNORMAL HIGH (ref 0.61–1.24)
GFR calc Af Amer: 56 mL/min — ABNORMAL LOW (ref >60)
GFR calc non Af Amer: 48 mL/min — ABNORMAL LOW (ref >60)
GLUCOSE: 314 mg/dL — AB (ref 65–99)
POTASSIUM: 4.8 mmol/L (ref 3.5–5.1)
Sodium: 134 mmol/L — ABNORMAL LOW (ref 135–145)

## 2015-10-23 LAB — I-STAT CG4 LACTIC ACID, ED
LACTIC ACID, VENOUS: 2.4 mmol/L — AB (ref 0.5–1.9)
Lactic Acid, Venous: 2.62 mmol/L (ref 0.5–1.9)

## 2015-10-23 LAB — URINALYSIS, ROUTINE W REFLEX MICROSCOPIC
Bilirubin Urine: NEGATIVE
KETONES UR: NEGATIVE mg/dL
Leukocytes, UA: NEGATIVE
Nitrite: NEGATIVE
PROTEIN: 100 mg/dL — AB
Specific Gravity, Urine: 1.025 (ref 1.005–1.030)
pH: 5.5 (ref 5.0–8.0)

## 2015-10-23 LAB — MRSA PCR SCREENING: MRSA by PCR: NEGATIVE

## 2015-10-23 LAB — HEPARIN LEVEL (UNFRACTIONATED)
HEPARIN UNFRACTIONATED: 0.56 [IU]/mL (ref 0.30–0.70)
Heparin Unfractionated: <0.1 IU/mL — ABNORMAL LOW (ref 0.30–0.70)
Heparin Unfractionated: 0.69 IU/mL (ref 0.30–0.70)

## 2015-10-23 LAB — GLUCOSE, CAPILLARY
GLUCOSE-CAPILLARY: 240 mg/dL — AB (ref 65–99)
GLUCOSE-CAPILLARY: 165 mg/dL — AB (ref 65–99)
Glucose-Capillary: 149 mg/dL — ABNORMAL HIGH (ref 65–99)

## 2015-10-23 LAB — I-STAT TROPONIN, ED: Troponin i, poc: 0 ng/mL (ref 0.00–0.08)

## 2015-10-23 LAB — APTT: aPTT: 28 seconds (ref 24–37)

## 2015-10-23 LAB — URINE MICROSCOPIC-ADD ON: WBC, UA: NONE SEEN WBC/hpf (ref 0–5)

## 2015-10-23 LAB — LACTIC ACID, PLASMA: Lactic Acid, Venous: 1.9 mmol/L (ref 0.5–1.9)

## 2015-10-23 LAB — CBG MONITORING, ED
GLUCOSE-CAPILLARY: 307 mg/dL — AB (ref 65–99)
Glucose-Capillary: 313 mg/dL — ABNORMAL HIGH (ref 65–99)

## 2015-10-23 MED ORDER — HEPARIN BOLUS VIA INFUSION
4000.0000 [IU] | Freq: Once | INTRAVENOUS | Status: AC
Start: 2015-10-23 — End: 2015-10-23
  Administered 2015-10-23: 4000 [IU] via INTRAVENOUS
  Filled 2015-10-23: qty 4000

## 2015-10-23 MED ORDER — PEG-KCL-NACL-NASULF-NA ASC-C 100 G PO SOLR
0.5000 | ORAL | Status: AC
Start: 2015-10-23 — End: 2015-10-24
  Administered 2015-10-23 – 2015-10-24 (×2): 100 g via ORAL
  Filled 2015-10-23: qty 1

## 2015-10-23 MED ORDER — SODIUM CHLORIDE 0.9 % IV SOLN
INTRAVENOUS | Status: DC
  Administered 2015-10-23: 04:00:00 via INTRAVENOUS

## 2015-10-23 MED ORDER — ISOSORBIDE MONONITRATE ER 60 MG PO TB24
30.0000 mg | ORAL_TABLET | Freq: Every day | ORAL | Status: DC
  Administered 2015-10-23: 30 mg via ORAL
  Filled 2015-10-23: qty 1

## 2015-10-23 MED ORDER — METOPROLOL TARTRATE 5 MG/5ML IV SOLN
5.0000 mg | Freq: Four times a day (QID) | INTRAVENOUS | Status: DC | PRN
  Filled 2015-10-23: qty 5

## 2015-10-23 MED ORDER — INSULIN GLARGINE 100 UNIT/ML ~~LOC~~ SOLN
10.0000 [IU] | Freq: Once | SUBCUTANEOUS | Status: AC
  Administered 2015-10-23: 10 [IU] via SUBCUTANEOUS
  Filled 2015-10-23: qty 0.1

## 2015-10-23 MED ORDER — EZETIMIBE 10 MG PO TABS
10.0000 mg | ORAL_TABLET | Freq: Every day | ORAL | Status: DC
  Administered 2015-10-23 – 2015-10-29 (×6): 10 mg via ORAL
  Filled 2015-10-23 (×7): qty 1

## 2015-10-23 MED ORDER — HYDRALAZINE HCL 20 MG/ML IJ SOLN: 10.0000 mg | Freq: Four times a day (QID) | INTRAMUSCULAR | Status: DC | PRN

## 2015-10-23 MED ORDER — ADENOSINE 6 MG/2ML IV SOLN
6.0000 mg | Freq: Once | INTRAVENOUS | Status: AC
  Administered 2015-10-23: 6 mg via INTRAVENOUS
  Filled 2015-10-23: qty 2

## 2015-10-23 MED ORDER — CIPROFLOXACIN IN D5W 400 MG/200ML IV SOLN
400.0000 mg | Freq: Two times a day (BID) | INTRAVENOUS | Status: DC
  Administered 2015-10-23 – 2015-10-26 (×8): 400 mg via INTRAVENOUS
  Filled 2015-10-23 (×8): qty 200

## 2015-10-23 MED ORDER — INSULIN ASPART 100 UNIT/ML ~~LOC~~ SOLN
0.0000 [IU] | SUBCUTANEOUS | Status: DC
  Administered 2015-10-23: 2 [IU] via SUBCUTANEOUS
  Administered 2015-10-23: 11 [IU] via SUBCUTANEOUS
  Administered 2015-10-23: 3 [IU] via SUBCUTANEOUS
  Administered 2015-10-23: 2 [IU] via SUBCUTANEOUS
  Administered 2015-10-23: 5 [IU] via SUBCUTANEOUS
  Administered 2015-10-24: 2 [IU] via SUBCUTANEOUS
  Administered 2015-10-24: 3 [IU] via SUBCUTANEOUS
  Administered 2015-10-24: 2 [IU] via SUBCUTANEOUS
  Administered 2015-10-24 – 2015-10-25 (×3): 3 [IU] via SUBCUTANEOUS
  Filled 2015-10-23: qty 1

## 2015-10-23 MED ORDER — ONDANSETRON HCL 4 MG/2ML IJ SOLN: 4.0000 mg | Freq: Four times a day (QID) | INTRAMUSCULAR | Status: DC | PRN

## 2015-10-23 MED ORDER — MORPHINE SULFATE (PF) 2 MG/ML IV SOLN
2.0000 mg | INTRAVENOUS | Status: DC | PRN
  Administered 2015-10-24: 2 mg via INTRAVENOUS
  Filled 2015-10-23: qty 1

## 2015-10-23 MED ORDER — SODIUM CHLORIDE 0.9 % IV BOLUS (SEPSIS)
1000.0000 mL | Freq: Once | INTRAVENOUS | Status: AC
  Administered 2015-10-23: 1000 mL via INTRAVENOUS

## 2015-10-23 MED ORDER — ROSUVASTATIN CALCIUM 10 MG PO TABS
20.0000 mg | ORAL_TABLET | Freq: Every day | ORAL | Status: DC
  Administered 2015-10-23 – 2015-10-28 (×6): 20 mg via ORAL
  Filled 2015-10-23: qty 1
  Filled 2015-10-23 (×3): qty 2
  Filled 2015-10-23: qty 1
  Filled 2015-10-23: qty 2

## 2015-10-23 MED ORDER — LISINOPRIL 2.5 MG PO TABS: 5.0000 mg | ORAL_TABLET | Freq: Every day | ORAL | Status: DC

## 2015-10-23 MED ORDER — ADENOSINE 6 MG/2ML IV SOLN
12.0000 mg | Freq: Once | INTRAVENOUS | Status: AC
  Administered 2015-10-23: 12 mg via INTRAVENOUS

## 2015-10-23 MED ORDER — HEPARIN (PORCINE) IN NACL 100-0.45 UNIT/ML-% IJ SOLN
1600.0000 [IU]/h | INTRAMUSCULAR | Status: AC
  Administered 2015-10-23 (×2): 1600 [IU]/h via INTRAVENOUS
  Filled 2015-10-23 (×2): qty 250

## 2015-10-23 MED ORDER — INSULIN GLARGINE 100 UNIT/ML ~~LOC~~ SOLN
25.0000 [IU] | Freq: Two times a day (BID) | SUBCUTANEOUS | Status: DC
  Administered 2015-10-23 – 2015-10-29 (×13): 25 [IU] via SUBCUTANEOUS
  Filled 2015-10-23 (×15): qty 0.25

## 2015-10-23 MED ORDER — ASPIRIN EC 81 MG PO TBEC
81.0000 mg | DELAYED_RELEASE_TABLET | Freq: Every day | ORAL | Status: DC
  Administered 2015-10-23 – 2015-10-29 (×6): 81 mg via ORAL
  Filled 2015-10-23 (×7): qty 1

## 2015-10-23 MED ORDER — METOPROLOL SUCCINATE ER 25 MG PO TB24
50.0000 mg | ORAL_TABLET | Freq: Every day | ORAL | Status: DC
  Administered 2015-10-23: 50 mg via ORAL
  Filled 2015-10-23: qty 2

## 2015-10-23 MED ORDER — HYDRALAZINE HCL 50 MG PO TABS
50.0000 mg | ORAL_TABLET | Freq: Three times a day (TID) | ORAL | Status: DC
  Filled 2015-10-23: qty 1

## 2015-10-23 MED ORDER — LISINOPRIL 2.5 MG PO TABS
5.0000 mg | ORAL_TABLET | Freq: Every day | ORAL | Status: DC
  Administered 2015-10-23: 5 mg via ORAL
  Filled 2015-10-23: qty 2

## 2015-10-23 MED ORDER — SODIUM CHLORIDE 0.9 % IV SOLN: INTRAVENOUS | Status: DC

## 2015-10-23 MED ORDER — METRONIDAZOLE IN NACL 5-0.79 MG/ML-% IV SOLN
500.0000 mg | Freq: Three times a day (TID) | INTRAVENOUS | Status: DC
  Administered 2015-10-23 – 2015-10-27 (×11): 500 mg via INTRAVENOUS
  Filled 2015-10-23 (×12): qty 100

## 2015-10-23 NOTE — Progress Notes (Signed)
ANTICOAGULATION CONSULT NOTE - Follow Up  Pharmacy Consult for Heparin Indication: AFib, H/O DVT  Allergies  Allergen Reactions  . Statins Other (See Comments)    Reaction:  Muscle pain   . Amlodipine Besylate Other (See Comments)    Reaction:  Muscle pain   . Cephalexin Hives    Patient Measurements: Height: _0  (175.3 cm) Weight: 282 lb 6.6 oz (128.1 kg) IBW/kg (Calculated) : 70.7 Heparin Dosing Weight: 100 kg  Vital Signs: Temp: 98.9 F (37.2 C) (07/17 0800) Temp Source: Oral (07/17 0800) BP: 147/75 mmHg (07/17 1000) Pulse Rate: 92 (07/17 1000)  Labs:  Recent Labs  10/22/15 1826 10/22/15 2046 10/23/15 0444 10/23/15 1110  HGB 17.0  --   --   --   HCT 48.2  --   --   --   PLT 184  --   --   --   APTT  --   --  28  --   HEPARINUNFRC  --   --  <0.10* 0.69  CREATININE  --  1.26* 1.44*  --     Estimated Creatinine Clearance: 65.1 mL/min (by C-G formula based on Cr of 1.44).   Medical History: Past Medical History  Diagnosis Date  . Hyperlipidemia   . Ischemic cardiomyopathy     a. EF 40% 2010. b. Echo (2/14):  mild LVH, EF 30-35%, diff HK, Gr 1 DD, MAC, mild MR, mod LAE, PASP 39  . Cataract   . Hypotension   . Paroxysmal atrial fibrillation (HCC)   . Peripheral vascular disease ([place])     a. s/p L BKA 2014.  [name] History of blood clots     in legs--this was in 2011--has been off of Coumadin 29month;takes Xarelto daily  . Joint pain   . H/O hiatal hernia   . Chronic kidney disease     on Lisinopril to protect kidneys d/t being on Metformin per pt  . Myocardial infarction ([place]     total of 8 heart attacks;last one in 2011  . [place]   . H/O medication noncompliance     Due to insurance issues  . Chronic systolic CHF (congestive heart failure) ([place]   . Coronary artery disease     a. s/p remote CABG 2001 at [facility] b. Cath 03/2010: for med rx.;  c.  [place] Adammyoview (1/14):  Large Inferior apical MI, very small area of peri-infarct ischemia  toward the inferior apical segment. EF 19%.  Med Rx was continued.    . Diabetes mellitus 1979    takes Metformni daily  and Lantus 50units bid  . DVT (deep venous thrombosis) ([place] 2011    legs  . Peripheral neuropathy ([place]   . Shortness of breath dyspnea   . Stroke ([facility]     Medications:  Scheduled:  . aspirin EC  81 mg Oral Daily  . ciprofloxacin  400 mg Intravenous BID  . ezetimibe  10 mg Oral Daily  . hydrALAZINE  50 mg Oral Q8H  . insulin aspart  0-15 Units Subcutaneous Q4H  . insulin glargine  25 Units Subcutaneous BID  . isosorbide mononitrate  30 mg Oral Daily  . lisinopril  5 mg Oral Daily  . metoprolol succinate  50 mg Oral Daily  . metronidazole  500 mg Intravenous Q8H  . peg 3350 powder  0.5 kit Oral 2 times per day on Mon Tue  . rosuvastatin  20 mg Oral q1800   Infusions:  . sodium chloride    .  heparin 1,600 Units/hr (10/23/15 0800)    Assessment:  70 yr male with significant PMH including MI, TIA, DM, HTN, AFib, DVT  Patient was on Xarelto 45m daily PTA but pt reports has not taken med within the past month due to inability to obtain medication.  Patient presents with abdominal pain, N/V.  CT Abdomen shows partial ileal obstruction and questionable enteritis.  Pharmacy consulted to dose IV heparin in anticipation of possible surgical intervention  Today, 10/23/2015  First heparin level at upper end of therapeutic range (0.69) - drawn ~6hrs after heparin bolus/infusion started.  SCr increased overnight, likely leading to some accumulation of heparin  CBC wnl  No bleeding reported  Goal of Therapy:  Heparin level 0.3-0.7 units/ml Monitor platelets by anticoagulation protocol: Yes   Plan:   Continue heparin 1600 units/hr  Recheck heparin level in 6hrs to confirm therapeutic  Per GI, heparin is to stop 7/18 at 04:00 in preparation for colonoscopy  F/u plans for resuming anticoagulation of colonoscopy tomorrow   EPeggyann Juba PharmD,  BCPS Pager: [phone]/17/2017,11:52 AM

## 2015-10-23 NOTE — Progress Notes (Signed)
Inpatient Diabetes Program Recommendations  AACE/ADA: New Consensus Statement on Inpatient Glycemic Control (2015)  Target Ranges:  Prepandial:   less than 140 mg/dL      Peak postprandial:   less than 180 mg/dL (1-2 hours)      Critically ill patients:  140 - 180 mg/dL   Lab Results  Component Value Date   GLUCAP 240* 10/23/2015   HGBA1C 12.0* 05/23/2015    Review of Glycemic Control  Diabetes history: DM2 Outpatient Diabetes medications: Lantus 50 units bid, Novolog 10 units tidwc, Canagliflozin 50/1000 mg bid Current orders for Inpatient glycemic control: Lantus 25 units bid, Novolog moderate Q4H  Need updated HgbA1C. Insulin needs are high. Uncontrolled blood sugars at present.  NPO  Inpatient Diabetes Program Recommendations:    ICU Glycemic Control Order Set  Will continue to follow. Thank you. Ailene Ards, RD, LDN, CDE Inpatient Diabetes Coordinator [phone]

## 2015-10-23 NOTE — ED Notes (Signed)
Staff at bedside attempting venous access.

## 2015-10-23 NOTE — ED Notes (Signed)
[place], MD at bedside 

## 2015-10-23 NOTE — Consult Note (Signed)
Reason for Consult: Mesenteric ischemia Referring Physician: *Dr. Owens Loffler  Leroy [name] is an 70 y.o. male.  HPI: Patient presented to the emergency room on 10/22/2015, complaining of abdominal pain that started 3 days prior to admission. He notes the pain is gradually worsening his also had episodes of nausea, vomiting and diarrhea over the last 3 days. No fever, he lives alone and no one else has been sick.  Godson eats with him sometimes. Pain has and still remains primarily on the right side. The right lower quadrant is much more tender than the right upper quadrant. He is not tender on the left side. He has had no nausea or vomiting since Sunday. He has reported some black stools yesterday. Workup in the emergency room shows tachycardia with a heart rate up to 159. He is also hypertensive, respiratory rate upper 18 to 30. Admission labs shows a sodium of 133, creatinine 1.26, glucose 260. BNP 147. WBC 15.1 H/H: 17/48. His first lactate was 2.4. Urinalysis showed moderate hemoglobin but was otherwise negative.  Chest x-ray on admission was unremarkable. CT scan shows dilated ileal loops containing fluid and fecalized content with mild mesenteric congestion transition point in the terminal ileum with borderline wall thickening. Possible enteritis. No mass was seen. There is extensive atherosclerosis without visible or proximal occlusion. Affected segments have enhancing walls, no signs of bowel necrosis.  He was admitted by medicine and placed on bowel rest and hydration.  Along with his history of atrial fibrillation he most recently had TIAs. He is supposed to be on Xarelto on his 09/21/15 visit is sounds like he was not very compliant with all his medications.  He's been evaluated this a.m. by Dr. Ardis Hughs.  He noted that he had been eating a lot of fast foods. He is still distended and has right lower quadrant tenderness with guarding decreased bowel sounds. Labs this a.m. show creatinine is up to  1.44. His glucose is 314. Lactate is down to 1.9. No further radiology studies. We are asked to see and evaluate for possible bowel ischemia.  He currently lives alone and is responsible for his own care.  Past Medical History  Diagnosis Date  . Hyperlipidemia   . Ischemic cardiomyopathy     a. EF 40% 2010. b. Echo (2/14):  mild LVH, EF 30-35%, diff HK, Gr 1 DD, MAC, mild MR, mod LAE, PASP 39  . Cataract   . Hypotension   . Paroxysmal atrial fibrillation (HCC)   . Peripheral vascular disease ([place])     a. s/p L BKA 2014.  [name] History of blood clots     in legs--this was in 2011--has been off of Coumadin 72month;takes Xarelto daily  . Joint pain   . H/O hiatal hernia   . Chronic kidney disease     on Lisinopril to protect kidneys d/t being on Metformin per pt  . Myocardial infarction ([place]     total of 8 heart attacks;last one in 2011  . Atrial flutter ([place]   . H/O medication noncompliance     Due to insurance issues  . Chronic systolic CHF (congestive heart failure) ([place]   . Coronary artery disease     a. s/p remote CABG 2001 at [facility] b. Cath 03/2010: for med rx.;  c.  [place] (1/14):  Large Inferior apical MI, very small area of peri-infarct ischemia toward the inferior apical segment. EF 19%.  Med Rx was continued.    . Diabetes mellitus 1979  takes Metformni daily  and Lantus 50units bid  . DVT (deep venous thrombosis) ([place]) 2011    legs  . Peripheral neuropathy ([place])   . Shortness of breath dyspnea   . Stroke [facility])     Past Surgical History  Procedure Laterality Date  . Revasculariztion  2011/2010    x 2   . Tonsillectomy    . Cardiac catheterization  03/19/10  . Abdominal aortagram  04-30-12  . Adenoidectomy    . Amputation Left 06/03/2012    Procedure: AMPUTATION BELOW KNEE;  Surgeon: Conrad Arnold, MD;  Location: [place];  Service: Vascular;  Laterality: Left;  . Coronary angioplasty  2002    3 stents  . Endarterectomy femoral Right 03/25/2013    Procedure:  ENDARTERECTOMY FEMORAL ;  Surgeon: Conrad [place], MD;  Location: [place];  Service: Vascular;  Laterality: Right;  . Patch angioplasty Right 03/25/2013    Procedure: PATCH ANGIOPLASTY USING VASCU-GUARD PERIPHERAL VASCULAR PATCH;  Surgeon: Conrad Outlook, MD;  Location: [place];  Service: Vascular;  Laterality: Right;  . Amputation Right 04/14/2013    Procedure: AMPUTATION BELOW KNEE ;  Surgeon: Conrad Highmore, MD;  Location: [place];  Service: Vascular;  Laterality: Right;  . I&d extremity Right 04/14/2013    Procedure: IRRIGATION AND DEBRIDEMENT OF RIGHT  GROIN & PLACEMENT OF VAC DRESSING;  Surgeon: Conrad [place], MD;  Location: [place];  Service: Vascular;  Laterality: Right;  . Abdominal aortagram N/A 04/30/2012    Procedure: ABDOMINAL Maxcine Ham;  Surgeon: Conrad [place], MD;  Location: [facility] CATH LAB;  Service: Cardiovascular;  Laterality: N/A;  . Lower extremity angiogram Bilateral 04/30/2012    Procedure: LOWER EXTREMITY ANGIOGRAM;  Surgeon: Conrad [place], MD;  Location: [facility] CATH LAB;  Service: Cardiovascular;  Laterality: Bilateral;  bilat lower extrem angio  . Eye surgery Right     Catarct  . Coronary artery bypass graft  03/2000    quadruple  . Hernia repair      Hiatal Hernia  . Wound debridement Right 04/20/2014    s/p  rt bka  with application of wound vac  . I&d extremity Right 04/20/2014    Procedure: DEBRIDEMENT OF RIGHT BELOW KNEE AMPUTATION STUMP;  Surgeon: Conrad [place]gap, MD;  Location: [facility] OR;  Service: Vascular;  Laterality: Right;  . Application of wound vac Right 04/20/2014    Procedure: APPLICATION OF WOUND VAC;  Surgeon: Conrad , MD;  Location: [place];  Service: Vascular;  Laterality: Right;    Family History  Problem Relation Age of Onset  . Heart disease Mother     before age 74  . Diabetes Mother   . Heart attack Mother   . Stroke Mother   . Heart disease Father     Social History:  reports that he has quit smoking. His smoking use included Cigarettes. He has never used smokeless  tobacco. He reports that he does not drink alcohol or use illicit drugs.  Allergies:  Allergies  Allergen Reactions  . Statins Other (See Comments)    Reaction:  Muscle pain   . Amlodipine Besylate Other (See Comments)    Reaction:  Muscle pain   . Cephalexin Hives    Medications:  Prior to Admission:  Prescriptions prior to admission  Medication Sig Dispense Refill Last Dose  . aspirin EC 81 MG tablet Take 81 mg by mouth daily.   3-4 DAYS AGO at Unknown time  . Canagliflozin-Metformin HCl 50-1000 MG  TABS Take 1 tablet by mouth 2 (two) times daily.    ~1 WEEK AGO at Unknown time  . ezetimibe (ZETIA) 10 MG tablet Take 1 tablet (10 mg total) by mouth daily. 30 tablet 1 ~1 week ago at Unknown time  . insulin aspart (NOVOLOG) 100 UNIT/ML injection Inject 10 Units into the skin 3 (three) times daily as needed for high blood sugar (BLOOD SUGARS GREATER THAN 200).    unknown at Unknown time  . insulin glargine (LANTUS) 100 UNIT/ML injection Inject 50 Units into the skin 2 (two) times daily.   10/20/2015 at Unknown time  . lisinopril (PRINIVIL,ZESTRIL) 5 MG tablet Take 1 tablet (5 mg total) by mouth daily. 30 tablet 0 ~1 month ago at Unknown time  . metoprolol succinate (TOPROL-XL) 50 MG 24 hr tablet Take 50 mg by mouth daily. Take with or immediately following a meal.   10/20/2015 at unsure  . nitroGLYCERIN (NITROSTAT) 0.4 MG SL tablet Place 1 tablet (0.4 mg total) under the tongue every 5 (five) minutes as needed for chest pain. 30 tablet 12 unknown at Unknown time  . rivaroxaban (XARELTO) 20 MG TABS tablet Take 1 tablet (20 mg total) by mouth daily at 6 PM. 30 tablet 0 several months ago at Unknown time   Scheduled: . aspirin EC  81 mg Oral Daily  . ciprofloxacin  400 mg Intravenous BID  . ezetimibe  10 mg Oral Daily  . insulin aspart  0-15 Units Subcutaneous Q4H  . insulin glargine  25 Units Subcutaneous BID  . metoprolol succinate  50 mg Oral Daily  . metronidazole  500 mg Intravenous Q8H   . peg 3350 powder  0.5 kit Oral 2 times per day on Mon Tue  . rosuvastatin  20 mg Oral q1800   Continuous: . sodium chloride 125 mL/hr at 10/23/15 0800  . heparin 1,600 Units/hr (10/23/15 0800)   ZOX:[id number], morphine injection, ondansetron (ZOFRAN) IV Anti-infectives    Start     Dose/Rate Route Frequency Ordered Stop   10/23/15 1200  metroNIDAZOLE (FLAGYL) IVPB 500 mg     500 mg 100 mL/hr over 60 Minutes Intravenous Every 8 hours 10/23/15 1034     10/23/15 1100  ciprofloxacin (CIPRO) IVPB 400 mg     400 mg 200 mL/hr over 60 Minutes Intravenous 2 times daily 10/23/15 1034        Results for orders placed or performed during the hospital encounter of 10/22/15 (from the past 48 hour(s))  CBC     Status: Abnormal   Collection Time: 10/22/15  6:26 PM  Result Value Ref Range   WBC 15.1 (H) 4.0 - 10.5 K/uL   RBC 5.52 4.22 - 5.81 MIL/uL   Hemoglobin 17.0 13.0 - 17.0 g/dL   HCT 48.2 39.0 - 52.0 %   MCV 87.3 78.0 - 100.0 fL   MCH 30.8 26.0 - 34.0 pg   MCHC 35.3 30.0 - 36.0 g/dL   RDW 13.1 11.5 - 15.5 %   Platelets 184 150 - 400 K/uL    Comment: SPECIMEN CHECKED FOR CLOTS REPEATED TO VERIFY PLATELET COUNT CONFIRMED BY SMEAR   Lipase, blood     Status: None   Collection Time: 10/22/15  8:46 PM  Result Value Ref Range   Lipase 14 11 - 51 U/L  Basic metabolic panel     Status: Abnormal   Collection Time: 10/22/15  8:46 PM  Result Value Ref Range   Sodium 133 (L) 135 - 145 mmol/L  Potassium 4.1 3.5 - 5.1 mmol/L   Chloride 98 (L) 101 - 111 mmol/L   CO2 28 22 - 32 mmol/L   Glucose, Bld 260 (H) 65 - 99 mg/dL   BUN 12 6 - 20 mg/dL   Creatinine, Ser 1.26 (H) 0.61 - 1.24 mg/dL   Calcium 8.9 8.9 - 10.3 mg/dL   GFR calc non Af Amer 57 (L) >60 mL/min   GFR calc Af Amer >60 >60 mL/min    Comment: (NOTE) The eGFR has been calculated using the CKD EPI equation. This calculation has not been validated in all clinical situations. eGFR's persistently <60 mL/min signify possible  Chronic Kidney Disease.    Anion gap 7 5 - 15  Brain natriuretic peptide     Status: Abnormal   Collection Time: 10/22/15  9:19 PM  Result Value Ref Range   B Natriuretic Peptide 147.3 (H) 0.0 - 100.0 pg/mL  Urinalysis, Routine w reflex microscopic     Status: Abnormal   Collection Time: 10/22/15 11:36 PM  Result Value Ref Range   Color, Urine AMBER (A) YELLOW    Comment: BIOCHEMICALS MAY BE AFFECTED BY COLOR   APPearance CLEAR CLEAR   Specific Gravity, Urine 1.025 1.005 - 1.030   pH 5.5 5.0 - 8.0   Glucose, UA >1000 (A) NEGATIVE mg/dL   Hgb urine dipstick MODERATE (A) NEGATIVE   Bilirubin Urine NEGATIVE NEGATIVE   Ketones, ur NEGATIVE NEGATIVE mg/dL   Protein, ur 100 (A) NEGATIVE mg/dL   Nitrite NEGATIVE NEGATIVE   Leukocytes, UA NEGATIVE NEGATIVE  Urine microscopic-add on     Status: Abnormal   Collection Time: 10/22/15 11:36 PM  Result Value Ref Range   Squamous Epithelial / LPF 0-5 (A) NONE SEEN   WBC, UA NONE SEEN 0 - 5 WBC/hpf   RBC / HPF 0-5 0 - 5 RBC/hpf   Bacteria, UA RARE (A) NONE SEEN   Casts HYALINE CASTS (A) NEGATIVE  I-Stat Troponin, ED (not at [facility])     Status: None   Collection Time: 10/23/15 12:08 AM  Result Value Ref Range   Troponin i, poc 0.00 0.00 - 0.08 ng/mL   Comment 3            Comment: Due to the release kinetics of cTnI, a negative result within the first hours of the onset of symptoms does not rule out myocardial infarction with certainty. If myocardial infarction is still suspected, repeat the test at appropriate intervals.   I-Stat CG4 Lactic Acid, ED     Status: Abnormal   Collection Time: 10/23/15 12:11 AM  Result Value Ref Range   Lactic Acid, Venous 2.40 (HH) 0.5 - 1.9 mmol/L   Comment NOTIFIED PHYSICIAN   I-Stat CG4 Lactic Acid, ED     Status: Abnormal   Collection Time: 10/23/15  3:16 AM  Result Value Ref Range   Lactic Acid, Venous 2.62 (HH) 0.5 - 1.9 mmol/L   Comment NOTIFIED PHYSICIAN   CBG monitoring, ED     Status: Abnormal    Collection Time: 10/23/15  3:46 AM  Result Value Ref Range   Glucose-Capillary 313 (H) 65 - 99 mg/dL  CBG monitoring, ED     Status: Abnormal   Collection Time: 10/23/15  4:41 AM  Result Value Ref Range   Glucose-Capillary 307 (H) 65 - 99 mg/dL  Basic metabolic panel     Status: Abnormal   Collection Time: 10/23/15  4:44 AM  Result Value Ref Range   Sodium 134 (  L) 135 - 145 mmol/L   Potassium 4.8 3.5 - 5.1 mmol/L   Chloride 101 101 - 111 mmol/L   CO2 23 22 - 32 mmol/L   Glucose, Bld 314 (H) 65 - 99 mg/dL   BUN 16 6 - 20 mg/dL   Creatinine, Ser 1.44 (H) 0.61 - 1.24 mg/dL   Calcium 8.3 (L) 8.9 - 10.3 mg/dL   GFR calc non Af Amer 48 (L) >60 mL/min   GFR calc Af Amer 56 (L) >60 mL/min    Comment: (NOTE) The eGFR has been calculated using the CKD EPI equation. This calculation has not been validated in all clinical situations. eGFR's persistently <60 mL/min signify possible Chronic Kidney Disease.    Anion gap 10 5 - 15  APTT     Status: None   Collection Time: 10/23/15  4:44 AM  Result Value Ref Range   aPTT 28 24 - 37 seconds  Heparin level (unfractionated)     Status: Abnormal   Collection Time: 10/23/15  4:44 AM  Result Value Ref Range   Heparin Unfractionated <0.10 (L) 0.30 - 0.70 IU/mL    Comment:        IF HEPARIN RESULTS ARE BELOW EXPECTED VALUES, AND PATIENT DOSAGE HAS BEEN CONFIRMED, SUGGEST FOLLOW UP TESTING OF ANTITHROMBIN III LEVELS.   MRSA PCR Screening     Status: None   Collection Time: 10/23/15  5:38 AM  Result Value Ref Range   MRSA by PCR NEGATIVE NEGATIVE    Comment:        The GeneXpert MRSA Assay (FDA approved for NASAL specimens only), is one component of a comprehensive MRSA colonization surveillance program. It is not intended to diagnose MRSA infection nor to guide or monitor treatment for MRSA infections.   Lactic acid, plasma     Status: None   Collection Time: 10/23/15  5:48 AM  Result Value Ref Range   Lactic Acid, Venous 1.9 0.5  - 1.9 mmol/L  Glucose, capillary     Status: Abnormal   Collection Time: 10/23/15  9:05 AM  Result Value Ref Range   Glucose-Capillary 240 (H) 65 - 99 mg/dL    Dg Chest 2 View  10/22/2015  CLINICAL DATA:  Shortness of breath. EXAM: CHEST  2 VIEW COMPARISON:  April 16, 2013 FINDINGS: The heart size borderline. No pulmonary nodules, masses, or infiltrates. No pneumothorax. IMPRESSION: No active cardiopulmonary disease. Electronically Signed   By: Dorise Bullion III M.D   On: 10/22/2015 20:56   Ct Abdomen Pelvis W Contrast  10/22/2015  CLINICAL DATA:  Right lower quadrant pain. EXAM: CT ABDOMEN AND PELVIS WITH CONTRAST TECHNIQUE: Multidetector CT imaging of the abdomen and pelvis was performed using the standard protocol following bolus administration of intravenous contrast. CONTRAST:  119m ISOVUE-300 IOPAMIDOL (ISOVUE-300) INJECTION 61% COMPARISON:  None. FINDINGS: Lower chest and abdominal wall: Remote left ventricular infarct with subendocardial scarring along the septum. Hepatobiliary: No focal liver abnormality.No evidence of biliary obstruction or stone. Pancreas: Unremarkable. Spleen: Unremarkable. Adrenals/Urinary Tract: Negative adrenals. Patchy renal cortical scarring, right worse than left. No hydronephrosis or ureteral calculus. Unremarkable bladder. Stomach/Bowel: Dilated ileal loops containing fluid and fecalized contents with mild mesenteric congestion. Transition point the terminal ileum there is borderline wall thickening. No causative mass is seen. Extensive atherosclerosis, without visible or proximal occlusion; affected segments have enhancing walls. No signs of bowel necrosis. Few uncomplicated colonic diverticula. Distal colonic diverticulum. Reproductive:No pathologic findings. Vascular/Lymphatic: Extensive aortic and branch vessel atherosclerosis. Remote vascular surgery in  the bilateral groin. Poor or no enhancement at the level of the right left SFA. Vascular evaluation is  limited by contrast timing. No acute visceral artery finding. Other: No ascites or pneumoperitoneum. Musculoskeletal: No acute abnormalities. Advanced lower lumbar facet arthropathy. Sacroiliac ankylosis with spurring. IMPRESSION: 1. Partial ileal obstruction without definitive underlying cause. Questionable regional enteritis. Patient has severe atherosclerosis, correlate with lactate. 2. Poor or no enhancement at the bilateral SFA. Remote left ventricular infarct. 3. Bilateral renal cortical scarring. Electronically Signed   By: Monte Fantasia M.D.   On: 10/22/2015 23:34    Review of Systems  Constitutional: Negative.   HENT: Negative.   Eyes: Negative.   Respiratory: Positive for wheezing (wheezes all the time.).   Cardiovascular: Negative.   Gastrointestinal: Positive for nausea, vomiting, abdominal pain and diarrhea. Negative for constipation, blood in stool and melena.  Genitourinary: Negative.   Musculoskeletal: Negative.   Skin: Negative.   Neurological: Negative.   Endo/Heme/Allergies: Negative.   Psychiatric/Behavioral: Negative.    Blood pressure 142/110, pulse 100, temperature 98.9 F (37.2 C), temperature source Oral, resp. rate 27, height _0  (1.753 m), weight 128.1 kg (282 lb 6.6 oz), SpO2 96 %. Physical Exam  Constitutional: He is oriented to person, place, and time. He appears well-developed and well-nourished. No distress.  Body mass index is 41.69 kg/(m^2).   HENT:  Head: Normocephalic and atraumatic.  Nose: Nose normal.  Eyes: Right eye exhibits no discharge. Left eye exhibits discharge. No scleral icterus.  Neck: Normal range of motion. Neck supple. No JVD present. No tracheal deviation present. No thyromegaly present.  Cardiovascular: Normal rate, regular rhythm and normal heart sounds.   Bilateral BKA's  Respiratory: Effort normal and breath sounds normal. No respiratory distress. He has no wheezes. He has no rales. He exhibits no tenderness.  Sternotomy  incision    GI: Soft.  Bowel sounds are hyperactive. Large abdomen tender on the right side. Most significant tenderness is in the right lower quadrant. He does not appear to be tender on the left side. Mid abdominal incision well healed.  Genitourinary: Penis normal.  Musculoskeletal:  Bilateral below-the-knee amputations well-healed.  Lymphadenopathy:    He has no cervical adenopathy.  Neurological: He is alert and oriented to person, place, and time. No cranial nerve deficit.  Skin: Skin is warm and dry. No rash noted. He is not diaphoretic. No erythema. No pallor.  Psychiatric: He has a normal mood and affect. His behavior is normal. Judgment and thought content normal.    Assessment/Plan: Abdominal pain rule out mesenteric ischemia. Possible enteritis History of atrial fibrillation History of CVA and recent TIA/off Xarelto for at least one month Significant peripheral vascular disease with bilateral below-knee amputations History of coronary artery bypass grafting and cardiac stents. Cardiomyopathy with last EF of approximately 40% History of chronic systolic congestive heart failure. Onset diabetes mellitus insulin-dependent with poor control Prior abdominal hernia repair. Body mass index is 41.69   Plan: Going to order 2 view film on him now, we'll keep him nothing by mouth until Dr. Excell Seltzer has a chance to evaluate. Dr. Ardis Hughs is tentatively planning colonoscopy tomorrow. We will follow with you. I would keep him nothing by mouth until he seen and evaluated by Dr. Excell Seltzer.  Shaunita Seney 10/23/2015, 10:27 AM

## 2015-10-23 NOTE — Progress Notes (Signed)
ANTICOAGULATION CONSULT NOTE - Initial Consult  Pharmacy Consult for Heparin Indication: AFib, H/O DVT  Allergies  Allergen Reactions  . Statins Other (See Comments)    Reaction:  Muscle pain   . Amlodipine Besylate Other (See Comments)    Reaction:  Muscle pain   . Cephalexin Hives    Patient Measurements:   Weight : 135.6 kg (06/2015) Heparin Dosing Weight: 103 kg  Vital Signs: Temp: 98.6 F (37 C) (07/16 1815) Temp Source: Oral (07/16 1815) BP: 111/70 mmHg (07/17 0230) Pulse Rate: 110 (07/17 0230)  Labs:  Recent Labs  10/22/15 1826 10/22/15 2046  HGB 17.0  --   HCT 48.2  --   PLT 184  --   CREATININE  --  1.26*    CrCl cannot be calculated (Unknown ideal weight.).   Medical History: Past Medical History  Diagnosis Date  . Hyperlipidemia   . Ischemic cardiomyopathy     a. EF 40% 2010. b. Echo (2/14):  mild LVH, EF 30-35%, diff HK, Gr 1 DD, MAC, mild MR, mod LAE, PASP 39  . Cataract   . Hypotension   . Paroxysmal atrial fibrillation (H[facility])   . Peripheral vascular disease (H[facility])     a. s/p L BKA 2014.  [name] History of blood clots     in legs--this was in 2011--has been off of Coumadin 78months;takes Xarelto daily  . Joint pain   . H/O hiatal hernia   . Chronic kidney disease     on Lisinopril to protect kidneys d/t being on Metformin per pt  . Myocardial infarction (H[facility])     total of 8 heart attacks;last one in 2011  . Atrial flutter (H[facility])   . H/O medication noncompliance     Due to insurance issues  . Chronic systolic CHF (congestive heart failure) (H[facility])   . Coronary artery disease     a. s/p remote CABG 2001 at [facility] b. Cath 03/2010: for med rx.;  c.  [name] myoview (1/14):  Large Inferior apical MI, very small area of peri-infarct ischemia toward the inferior apical segment. EF 19%.  Med Rx was continued.    . Diabetes mellitus 1979    takes Metformni daily  and Lantus 50units bid  . DVT (deep venous thrombosis) (H[facility]) 2011    legs  . Peripheral  neuropathy (HCC)   . Shortness of breath dyspnea   . Stroke [facility])     Medications:  Scheduled:  . aspirin EC  81 mg Oral Daily  . ezetimibe  10 mg Oral Daily  . insulin aspart  0-15 Units Subcutaneous Q4H  . insulin glargine  25 Units Subcutaneous BID  . lisinopril  5 mg Oral Daily  . metoprolol succinate  50 mg Oral Daily  . rosuvastatin  20 mg Oral q1800   Infusions:  . sodium chloride      Assessment:  70 yr male with significant PMH including MI, TIA, DM, HTN, AFib, DVT  Patient was on Xarelto  daily PTA but pt reports has not taken med within the past month due to inability to obtain medication.  Patient presents with abdominal pain, N/V.  CT Abdomen shows partial ileal obstruction and questionable enteritis.  Pharmacy consulted to dose IV heparin in anticipation of possible surgical intervention  Most recent weight in EPIC was March 2017.  Patient currently in ED in a bed with no scale and RN states patient unable to provide a stated current weight.  Will initiate heparin off most recent  known weight and modify dose as needed.  Goal of Therapy:  Heparin level 0.3-0.7 units/ml Monitor platelets by anticoagulation protocol: Yes   Plan:   Obtain baseline aPTT and heparin level  Due to patient not taking Xarelto within the past month, will give Heparin 4000 unit IV bolus x 1 followed by heparin infusion @ 1600 units/hr  F/U confirmed weight (have placed order)  Check heparin level 6 hr after heparin started  Follow CBC and heparin level daily  Poppi Scantling, Joselyn Glassman, PharmD 10/23/2015,3:35 AM

## 2015-10-23 NOTE — Progress Notes (Signed)
ANTICOAGULATION CONSULT NOTE - Follow Up  Pharmacy Consult for Heparin Indication: AFib, H/O DVT  Please see pharmacist note from earlier today for further detail.  Second heparin level therapeutic at 0.56 with infusion at 1600 units/hr.  No interruptions or complications to heparin infusion documented.   Goal of Therapy:  Heparin level 0.3-0.7 units/ml Monitor platelets by anticoagulation protocol: Yes   Plan:   Continue heparin 1600 units/hr  Per GI, heparin is to stop 7/18 at 04:00 in preparation for colonoscopy  F/u plans for resuming anticoagulation after colonoscopy tomorrow   Clance Boll, PharmD, BCPS Pager: [phone] 10/23/2015 7:13 PM

## 2015-10-23 NOTE — Progress Notes (Signed)
PROGRESS NOTE                                                                                                                                                                                                             Patient Demographics:    Leroy [name], is a 70 y.o. male, DOB - 11/26/1945, FYB:[id number]  Admit date - 10/22/2015   Admitting Physician Etta Quill, DO  Outpatient Primary MD for the patient is [name], MD  LOS - 0  Chief Complaint  Patient presents with  . Abdominal Pain  . Emesis       Brief Narrative    Leroy [name] is a 70 y.o. male with medical history significant of atherosclerotic disease of multiple sites (PAD, MI, TIA, etc), DM2 on insulin, HTN. Patient presents to the ED with c/o abdominal pain, N/V/D. Pain is located in his RLQ, onset on Friday. Has been worsening since that time. Anything he eats gets vomited back up he says and pain becomes worse. Can keep down water though.  ED Course: CT abd / pelvis shows regional enteritis and is worrisome for mesenteric ischemia, especially given his extensive vascular disease. Lactate is slightly elevated initially at 2.4.   Subjective:    Leroy [name] today has, No headache, No chest pain, Mild right lower quadrant abdominal pain - No Nausea, No new weakness tingling or numbness, No Cough - SOB.     Assessment  & Plan :     1.Right lower quadrant abdominal pain with CT scan suspicious for terminal ileum inflammation. In the light of patient's history of severe arterial disease with bilateral BKA, CAD, TIA also paroxysmal atrial fibrillation/atrial flutter. Suspicion for ischemic bowel is high, he is currently on bowel rest with clear liquid diet and heparin drip. Also being covered with IV Cipro and Flagyl as infection cannot be completely ruled out, GI and general surgery both on board and it is planned for colonoscopy on 10/24/2015.  Continue hydration for now with caution.  2. Paroxysmal atrial fibrillation/atrial flutter. Also history of paroxysmal supraventricular tachycardia. Continue beta blocker, xaralto held and transitioned to heparin drip for now, continue aspirin. Telemetry monitor.  3. Essential hypertension. For now on beta blocker will add hydralazine scheduled and as needed. Reduce ACE inhibitor due to #1 above.   4. 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 Currently he is compensated, on beta blocker, low-dose ACE inhibitor due to ongoing GI issues and to avoid renal failure, have added hydralazine and Imdur, reduce IV fluids and monitor closely.  5. CAD. Currently chest pain-free, on aspirin and beta blocker long with Zetia for secondary prevention.  6. PAD with bilateral BKA. Supportive care.  7. DM type II. On half home dose Lantus along with sliding scale will monitor.  Lab Results  Component Value Date   HGBA1C 12.0* 05/23/2015   CBG (last 3)   Recent Labs  10/23/15 0346 10/23/15 0441 10/23/15 0905  GLUCAP 313* 307* 240*       Family Communication  :  None present  Code Status : Full  Diet :  liquid  Disposition Plan  :  Stay in stepdown  Consults  :  GI, general surgery  Procedures  :    CT scan abdomen and pelvis with terminal ileum inflammation  TTE Feb 2017  Left ventricle: The cavity size was normal. Systolic function was severely reduced. The estimated ejection fraction was in the range of 25% to 30%. Diffuse hypokinesis. There was an increased relative contribution of atrial contraction to ventricular  filling. Doppler parameters are consistent with abnormal left ventricular relaxation (grade 1 diastolic dysfunction). - Aortic valve: Trileaflet; mildly thickened, mildly calcified leaflets. - Mitral valve: Calcified annulus. - Pulmonary arteries: PA peak pressure: 39 mm Hg (S).  Impressions:  The right ventricular systolic  pressure was increased consistent with mild pulmonary hypertension.    DVT Prophylaxis  :   Heparin  gtt  Lab Results  Component Value Date   PLT 184 10/22/2015    Inpatient Medications  Scheduled Meds: . aspirin EC  81 mg Oral Daily  . ciprofloxacin  400 mg Intravenous BID  . ezetimibe  10 mg Oral Daily  . insulin aspart  0-15 Units Subcutaneous Q4H  . insulin glargine  25 Units Subcutaneous BID  . metoprolol succinate  50 mg Oral Daily  . metronidazole  500 mg Intravenous Q8H  . peg 3350 powder  0.5 kit Oral 2 times per day on Mon Tue  . rosuvastatin  20 mg Oral q1800   Continuous Infusions: . sodium chloride 125 mL/hr at 10/23/15 1000  . heparin 1,600 Units/hr (10/23/15 0800)   PRN Meds:.metoprolol, morphine injection, ondansetron (ZOFRAN) IV  Antibiotics  :    Anti-infectives    Start     Dose/Rate Route Frequency Ordered Stop   10/23/15 1200  metroNIDAZOLE (FLAGYL) IVPB 500 mg     500 mg 100 mL/hr over 60 Minutes Intravenous Every 8 hours 10/23/15 1034     10/23/15 1100  ciprofloxacin (CIPRO) IVPB 400 mg     400 mg 200 mL/hr over 60 Minutes Intravenous 2 times daily 10/23/15 1034           Objective:   Filed Vitals:   10/23/15 0545 10/23/15 0600 10/23/15 0700 10/23/15 0800  BP: 152/75 108/71 145/47 142/110  Pulse: 100 103 95 100  Temp:    98.9 F (37.2 C)  TempSrc:    Oral  Resp: 23 28 31 27   Height:      Weight:      SpO2: 96% 95% 95% 96%    Wt Readings from Last 3 Encounters:  10/23/15 128.1 kg (282 lb 6.6 oz)  06/21/15 135.626 kg (299 lb)  05/22/15 135.081 kg (297 lb 12.8 oz)     Intake/Output Summary (  Last 24 hours) at 10/23/15 1120 Last data filed at 10/23/15 1000  Gross per 24 hour  Intake 801.73 ml  Output      0 ml  Net 801.73 ml     Physical Exam  Awake Alert, Oriented X 3, No new F.N deficits, Normal affect [place].AT,PERRAL Supple Neck,No JVD, No cervical lymphadenopathy appriciated.  Symmetrical Chest wall movement, Good air  movement bilaterally, CTAB RRR,No Gallops,Rubs or new Murmurs, No Parasternal Heave +ve B.Sounds, Abd Soft, +ve RLQ tenderness, No organomegaly appriciated, No rebound - guarding or rigidity. No Cyanosis, Clubbing or edema, No new Rash or bruise , bilateral BKA     Data Review:    CBC  Recent Labs Lab 10/22/15 1826  WBC 15.1*  HGB 17.0  HCT 48.2  PLT 184  MCV 87.3  MCH 30.8  MCHC 35.3  RDW 13.1    Chemistries   Recent Labs Lab 10/22/15 2046 10/23/15 0444  NA 133* 134*  K 4.1 4.8  CL 98* 101  CO2 28 23  GLUCOSE 260* 314*  BUN 12 16  CREATININE 1.26* 1.44*  CALCIUM 8.9 8.3*   ------------------------------------------------------------------------------------------------------------------ No results for input(s): CHOL, HDL, LDLCALC, TRIG, CHOLHDL, LDLDIRECT in the last 72 hours.  Lab Results  Component Value Date   HGBA1C 12.0* 05/23/2015   Lab Results  Component Value Date   INR 1.01 05/22/2015   INR 1.17 04/12/2013   INR 0.97 03/23/2013    ------------------------------------------------------------------------------------------------------------------ No results for input(s): TSH, T4TOTAL, T3FREE, THYROIDAB in the last 72 hours.  Invalid input(s): FREET3 ------------------------------------------------------------------------------------------------------------------ No results for input(s): VITAMINB12, FOLATE, FERRITIN, TIBC, IRON, RETICCTPCT in the last 72 hours.  Coagulation profile No results for input(s): INR, PROTIME in the last 168 hours.  No results for input(s): DDIMER in the last 72 hours.  Cardiac Enzymes No results for input(s): CKMB, TROPONINI, MYOGLOBIN in the last 168 hours.  Invalid input(s): CK ------------------------------------------------------------------------------------------------------------------    Component Value Date/Time   BNP 147.3* 10/22/2015 2119    Micro Results Recent Results (from the past 240 hour(s))    MRSA PCR Screening     Status: None   Collection Time: 10/23/15  5:38 AM  Result Value Ref Range Status   MRSA by PCR NEGATIVE NEGATIVE Final    Comment:        The GeneXpert MRSA Assay (FDA approved for NASAL specimens only), is one component of a comprehensive MRSA colonization surveillance program. It is not intended to diagnose MRSA infection nor to guide or monitor treatment for MRSA infections.     Radiology Reports Dg Chest 2 View  10/22/2015  CLINICAL DATA:  Shortness of breath. EXAM: CHEST  2 VIEW COMPARISON:  April 16, 2013 FINDINGS: The heart size borderline. No pulmonary nodules, masses, or infiltrates. No pneumothorax. IMPRESSION: No active cardiopulmonary disease. Electronically Signed   By: Dorise Bullion III M.D   On: 10/22/2015 20:56   Ct Abdomen Pelvis W Contrast  10/22/2015  CLINICAL DATA:  Right lower quadrant pain. EXAM: CT ABDOMEN AND PELVIS WITH CONTRAST TECHNIQUE: Multidetector CT imaging of the abdomen and pelvis was performed using the standard protocol following bolus administration of intravenous contrast. CONTRAST:  141m ISOVUE-300 IOPAMIDOL (ISOVUE-300) INJECTION 61% COMPARISON:  None. FINDINGS: Lower chest and abdominal wall: Remote left ventricular infarct with subendocardial scarring along the septum. Hepatobiliary: No focal liver abnormality.No evidence of biliary obstruction or stone. Pancreas: Unremarkable. Spleen: Unremarkable. Adrenals/Urinary Tract: Negative adrenals. Patchy renal cortical scarring, right worse than left. No hydronephrosis or ureteral calculus.  Unremarkable bladder. Stomach/Bowel: Dilated ileal loops containing fluid and fecalized contents with mild mesenteric congestion. Transition point the terminal ileum there is borderline wall thickening. No causative mass is seen. Extensive atherosclerosis, without visible or proximal occlusion; affected segments have enhancing walls. No signs of bowel necrosis. Few uncomplicated colonic  diverticula. Distal colonic diverticulum. Reproductive:No pathologic findings. Vascular/Lymphatic: Extensive aortic and branch vessel atherosclerosis. Remote vascular surgery in the bilateral groin. Poor or no enhancement at the level of the right left SFA. Vascular evaluation is limited by contrast timing. No acute visceral artery finding. Other: No ascites or pneumoperitoneum. Musculoskeletal: No acute abnormalities. Advanced lower lumbar facet arthropathy. Sacroiliac ankylosis with spurring. IMPRESSION: 1. Partial ileal obstruction without definitive underlying cause. Questionable regional enteritis. Patient has severe atherosclerosis, correlate with lactate. 2. Poor or no enhancement at the bilateral SFA. Remote left ventricular infarct. 3. Bilateral renal cortical scarring. Electronically Signed   By: Monte Fantasia M.D.   On: 10/22/2015 23:34    Time Spent in minutes  30   Leroy [name] K M.D on 10/23/2015 at 11:20 AM  Between 7am to 7pm - Pager - [phone]  After 7pm go to [email or web address] - password [facility]  [facility] -  Office  [phone]

## 2015-10-23 NOTE — Consult Note (Addendum)
Consultation  Referring Provider: Dr. Thedore Mins     Primary Care Physician:  [name], MD Primary Gastroenterologist: None     Reason for Consultation: RLQ pain            HPI:   Leroy [name] is a 70 y.o. male with a past medical history significant for atherosclerotic disease of multiple sites (PAD, MI, TIA...) on chronic anticoagulation with Xarelto, Diabetes type 2 on insulin, bilateral BKA and hypertension who presented to the ER on 10/22/15 with a chief complaint of abdominal pain, nausea, vomiting and diarrhea.  Today, the patient expresses that on Friday of last week, 10/20/15 he developed a right lower quadrant pain when he awoke in the morning and started "getting up and moving around", on that same day he started with loose bowel movements, multiple. He does tell me that recently he has been eating a lot of fast unhealthy food, and thought this may be related. Patient's pain as a constant 9/10 which is not helped by anything but is made worse when he "lays on that side". Patient did try taking some Pepto-Bismol and tells me that this did help a little bit. The patient denies any chronic heartburn or reflux symptoms. He expresses that he has continued to have multiple loose stools per day with his last stool yesterday. He does express having 3 "black" stools yesterday. He has also had multiple episodes of nausea and vomiting since Friday with his last being yesterday before time of admission.  Patient denies fever, chills, anorexia, dysphagia, hematemesis, previous episodes of the same, use of NSAIDs or alcohol.  ED course: CT abdomen/pelvis shows regional enteritis and is worrisome for mesenteric ischemia, especially given his extensive vascular disease. Lactate slightly elevated at 2.4.  Past Medical History  Diagnosis Date  . Hyperlipidemia   . Ischemic cardiomyopathy     a. EF 40% 2010. b. Echo (2/14):  mild LVH, EF 30-35%, diff HK, Gr 1 DD, MAC, mild MR, mod LAE, PASP 39  .  Cataract   . Hypotension   . Paroxysmal atrial fibrillation (HCC)   . Peripheral vascular disease (HCC)     a. s/p L BKA 2014.  [name] History of blood clots     in legs--this was in 2011--has been off of Coumadin 47months;takes Xarelto daily  . Joint pain   . H/O hiatal hernia   . Chronic kidney disease     on Lisinopril to protect kidneys d/t being on Metformin per pt  . Myocardial infarction (HCC)     total of 8 heart attacks;last one in 2011  . Atrial flutter (HCC)   . H/O medication noncompliance     Due to insurance issues  . Chronic systolic CHF (congestive heart failure) (HCC)   . Coronary artery disease     a. s/p remote CABG 2001 at [facility] b. Cath 03/2010: for med rx.;  c.  [name] myoview (1/14):  Large Inferior apical MI, very small area of peri-infarct ischemia toward the inferior apical segment. EF 19%.  Med Rx was continued.    . Diabetes mellitus 1979    takes Metformni daily  and Lantus 50units bid  . DVT (deep venous thrombosis) (HCC) 2011    legs  . Peripheral neuropathy (HCC)   . Shortness of breath dyspnea   . Stroke [facility])     Past Surgical History  Procedure Laterality Date  . Revasculariztion  2011/2010    x 2   . Tonsillectomy    .  Cardiac catheterization  03/19/10  . Abdominal aortagram  04-30-12  . Adenoidectomy    . Amputation Left 06/03/2012    Procedure: AMPUTATION BELOW KNEE;  Surgeon: [name], MD;  Location: [facility] OR;  Service: Vascular;  Laterality: Left;  . Coronary angioplasty  2002    3 stents  . Endarterectomy femoral Right 03/25/2013    Procedure: ENDARTERECTOMY FEMORAL ;  Surgeon: [name], MD;  Location: [facility] OR;  Service: Vascular;  Laterality: Right;  . Patch angioplasty Right 03/25/2013    Procedure: PATCH ANGIOPLASTY USING VASCU-GUARD PERIPHERAL VASCULAR PATCH;  Surgeon: [name], MD;  Location: [facility] OR;  Service: Vascular;  Laterality: Right;  . Amputation Right 04/14/2013    Procedure: AMPUTATION BELOW KNEE ;  Surgeon: [name], MD;  Location: [facility] OR;  Service: Vascular;  Laterality: Right;  . I&d extremity Right 04/14/2013    Procedure: IRRIGATION AND DEBRIDEMENT OF RIGHT  GROIN & PLACEMENT OF VAC DRESSING;  Surgeon: [name], MD;  Location: [facility] OR;  Service: Vascular;  Laterality: Right;  . Abdominal aortagram N/A 04/30/2012    Procedure: ABDOMINAL Ronny Flurry;  Surgeon: [name], MD;  Location: [facility] CATH LAB;  Service: Cardiovascular;  Laterality: N/A;  . Lower extremity angiogram Bilateral 04/30/2012    Procedure: LOWER EXTREMITY ANGIOGRAM;  Surgeon: [name], MD;  Location: [facility] CATH LAB;  Service: Cardiovascular;  Laterality: Bilateral;  bilat lower extrem angio  . Eye surgery Right     Catarct  . Coronary artery bypass graft  03/2000    quadruple  . Hernia repair      Hiatal Hernia  . Wound debridement Right 04/20/2014    s/p  rt bka  with application of wound vac  . I&d extremity Right 04/20/2014    Procedure: DEBRIDEMENT OF RIGHT BELOW KNEE AMPUTATION STUMP;  Surgeon: [name], MD;  Location: [facility] OR;  Service: Vascular;  Laterality: Right;  . Application of wound vac Right 04/20/2014    Procedure: APPLICATION OF WOUND VAC;  Surgeon: [name], MD;  Location: [facility] OR;  Service: Vascular;  Laterality: Right;    Family History  Problem Relation Age of Onset  . Heart disease Mother     before age 63  . Diabetes Mother   . Heart attack Mother   . Stroke Mother   . Heart disease Father     Social History  Substance Use Topics  . Smoking status: Former Smoker    Types: Cigarettes  . Smokeless tobacco: Never Used     Comment: quit smoking 14yrs ago  . Alcohol Use: No     Comment: occasionally    Prior to Admission medications   Medication Sig Start Date End Date Taking? Authorizing Provider  aspirin EC 81 MG tablet Take 81 mg by mouth daily.   Yes Historical Provider, MD  Canagliflozin-Metformin HCl 50-1000 MG TABS Take 1 tablet by mouth 2 (two) times daily.    Yes Historical Provider, MD   ezetimibe (ZETIA) 10 MG tablet Take 1 tablet (10 mg total) by mouth daily. 05/07/13  Yes Branda Chaudhary J Angiulli, PA-C  insulin aspart (NOVOLOG) 100 UNIT/ML injection Inject 10 Units into the skin 3 (three) times daily as needed for high blood sugar (BLOOD SUGARS GREATER THAN 200).    Yes Historical Provider, MD  insulin glargine (LANTUS) 100 UNIT/ML injection Inject 50 Units into the skin 2 (two) times daily.   Yes Historical Provider, MD  lisinopril (PRINIVIL,ZESTRIL) 5  MG tablet Take 1 tablet (5 mg total) by mouth daily. 06/21/15  Yes Nilda Riggs, NP  metoprolol succinate (TOPROL-XL) 50 MG 24 hr tablet Take 50 mg by mouth daily. Take with or immediately following a meal.   Yes Historical Provider, MD  nitroGLYCERIN (NITROSTAT) 0.4 MG SL tablet Place 1 tablet (0.4 mg total) under the tongue every 5 (five) minutes as needed for chest pain. 05/07/13  Yes Iliyana Convey J Angiulli, PA-C  rivaroxaban (XARELTO) 20 MG TABS tablet Take 1 tablet (20 mg total) by mouth daily at 6 PM. 06/21/15  Yes Nilda Riggs, NP    Current Facility-Administered Medications  Medication Dose Route Frequency Provider Last Rate Last Dose  . 0.9 %  sodium chloride infusion   Intravenous Continuous Hillary Bow, DO 125 mL/hr at 10/23/15 0800    . aspirin EC tablet 81 mg  81 mg Oral Daily Hillary Bow, DO   81 mg at 10/23/15 0911  . ezetimibe (ZETIA) tablet 10 mg  10 mg Oral Daily Hillary Bow, DO   10 mg at 10/23/15 0911  . heparin ADULT infusion 100 units/mL (25000 units/230mL sodium chloride 0.45%)  1,600 Units/hr Intravenous Continuous Leann T Poindexter, RPH 16 mL/hr at 10/23/15 0800 1,600 Units/hr at 10/23/15 0800  . insulin aspart (novoLOG) injection 0-15 Units  0-15 Units Subcutaneous Q4H Hillary Bow, DO   5 Units at 10/23/15 0908  . insulin glargine (LANTUS) injection 25 Units  25 Units Subcutaneous BID Hillary Bow, DO   25 Units at 10/23/15 0347  . metoprolol (LOPRESSOR) injection 5 mg  5 mg  Intravenous Q6H PRN Leroy Sea, MD      . metoprolol succinate (TOPROL-XL) 24 hr tablet 50 mg  50 mg Oral Daily Hillary Bow, DO   50 mg at 10/23/15 0911  . morphine 2 MG/ML injection 2-4 mg  2-4 mg Intravenous Q4H PRN Hillary Bow, DO      . ondansetron [facility]) injection 4 mg  4 mg Intravenous Q6H PRN Hillary Bow, DO      . rosuvastatin (CRESTOR) tablet 20 mg  20 mg Oral q1800 Hillary Bow, DO        Allergies as of 10/22/2015 - Review Complete 10/22/2015  Allergen Reaction Noted  . Statins Other (See Comments) 01/02/2012  . Amlodipine besylate Other (See Comments) 03/28/2010  . Cephalexin Hives 03/28/2010     Review of Systems:    Constitutional: No weight loss, fever or chills HEENT: Eyes: No change in vision               Ears, Nose, Throat:  No change in hearing Skin: No new rash Cardiovascular: No chest pain, chest pressure or palpitations Respiratory: No SOB or cough Gastrointestinal: See HPI and otherwise negative Genitourinary: No dysuria or hematuria Neurological: No headache, dizziness or syncope Musculoskeletal: No new muscle or back pain Hematologic: No bleeding Psychiatric: No history of depression or anxiety   Physical Exam:  Vital signs in last 24 hours: Temp:  [98.6 F (37 C)-98.9 F (37.2 C)] 98.9 F (37.2 C) (07/17 0800) Pulse Rate:  [95-162] 100 (07/17 0800) Resp:  [15-34] 27 (07/17 0800) BP: (90-152)/(34-115) 142/110 mmHg (07/17 0800) SpO2:  [95 %-99 %] 96 % (07/17 0800) Weight:  [282 lb 6.6 oz (128.1 kg)] 282 lb 6.6 oz (128.1 kg) (07/17 0529) Last BM Date: 10/22/15 General:   Pleasant African-American male appears to be in NAD, Well developed, Well nourished,  somewhat lethargic but cooperative Head:  Normocephalic and atraumatic. Eyes:   PEERL, EOMI. No icterus. Conjunctiva pink. Ears:  Normal auditory acuity. Neck:  Supple Throat: Oral cavity and pharynx without inflammation, swelling or lesion.  Lungs: Respirations even and  unlabored. Lungs clear to auscultation bilaterally.   No wheezes, crackles, or rhonchi.  Heart: Normal S1, S2. No MRG. Regular rate and rhythm. No peripheral edema, cyanosis or pallor.  Abdomen:  Soft, mild distention, marked right lower quadrant tenderness with involuntary guarding, Decreased bowel sounds. No appreciable masses or hepatomegaly. Rectal:  Not performed.  Msk:  Symmetrical without gross deformities. Peripheral pulses intact, bilateral BKA Extremities:  Without edema, bilateral BKA Neurologic:  Alert and  oriented x4;  grossly normal neurologically Skin:   Dry and intact without significant lesions or rashes. Psychiatric: Oriented to person, place and time. Demonstrates good judgement and reason without abnormal affect or behaviors.   LAB RESULTS:  Recent Labs  10/22/15 1826  WBC 15.1*  HGB 17.0  HCT 48.2  PLT 184   BMET  Recent Labs  10/22/15 2046 10/23/15 0444  NA 133* 134*  K 4.1 4.8  CL 98* 101  CO2 28 23  GLUCOSE 260* 314*  BUN 12 16  CREATININE 1.26* 1.44*  CALCIUM 8.9 8.3*   LFT No results for input(s): PROT, ALBUMIN, AST, ALT, ALKPHOS, BILITOT, BILIDIR, IBILI in the last 72 hours. PT/INR No results for input(s): LABPROT, INR in the last 72 hours.  STUDIES: Dg Chest 2 View  10/22/2015  CLINICAL DATA:  Shortness of breath. EXAM: CHEST  2 VIEW COMPARISON:  April 16, 2013 FINDINGS: The heart size borderline. No pulmonary nodules, masses, or infiltrates. No pneumothorax. IMPRESSION: No active cardiopulmonary disease. Electronically Signed   By: Gerome Sam III M.D   On: 10/22/2015 20:56   Ct Abdomen Pelvis W Contrast  10/22/2015  CLINICAL DATA:  Right lower quadrant pain. EXAM: CT ABDOMEN AND PELVIS WITH CONTRAST TECHNIQUE: Multidetector CT imaging of the abdomen and pelvis was performed using the standard protocol following bolus administration of intravenous contrast. CONTRAST:  ISOVUE-300 IOPAMIDOL (ISOVUE-300) INJECTION 61% COMPARISON:   None. FINDINGS: Lower chest and abdominal wall: Remote left ventricular infarct with subendocardial scarring along the septum. Hepatobiliary: No focal liver abnormality.No evidence of biliary obstruction or stone. Pancreas: Unremarkable. Spleen: Unremarkable. Adrenals/Urinary Tract: Negative adrenals. Patchy renal cortical scarring, right worse than left. No hydronephrosis or ureteral calculus. Unremarkable bladder. Stomach/Bowel: Dilated ileal loops containing fluid and fecalized contents with mild mesenteric congestion. Transition point the terminal ileum there is borderline wall thickening. No causative mass is seen. Extensive atherosclerosis, without visible or proximal occlusion; affected segments have enhancing walls. No signs of bowel necrosis. Few uncomplicated colonic diverticula. Distal colonic diverticulum. Reproductive:No pathologic findings. Vascular/Lymphatic: Extensive aortic and branch vessel atherosclerosis. Remote vascular surgery in the bilateral groin. Poor or no enhancement at the level of the right left SFA. Vascular evaluation is limited by contrast timing. No acute visceral artery finding. Other: No ascites or pneumoperitoneum. Musculoskeletal: No acute abnormalities. Advanced lower lumbar facet arthropathy. Sacroiliac ankylosis with spurring. IMPRESSION: 1. Partial ileal obstruction without definitive underlying cause. Questionable regional enteritis. Patient has severe atherosclerosis, correlate with lactate. 2. Poor or no enhancement at the bilateral SFA. Remote left ventricular infarct. 3. Bilateral renal cortical scarring. Electronically Signed   By: Marnee Spring M.D.   On: 10/22/2015 23:34     PREVIOUS ENDOSCOPIES:            Colonoscopy: 5-10 yrs ago  [place] in [place] at [facility] clinic- "clean" per pt, no report available EGD: 5-10 yrs ago, pt can't remember where or results   Impression / Plan:  Impression: 1. Right lower quadrant abdominal pain: 3 day history of  increasing right lower quadrant abdominal pain, CT shows a partial ileal obstruction without definitive underlying cause, question of regional enteritis; lactate was minimally increased at 2.4 at time of admission, improved to 1.9 this morning; consider mesenteric ischemia versus enteritis versus ileus versus other 2. Abnormal CT of the abdomen: Showing partial ileal obstruction without definitive underlying cause with questionable regional enteritis 3. Hyponatremia  Plan: 1. Continue supportive measures 2. Agree with nausea and pain control 3. Will discuss above with Dr. Christella Hartigan, please await any further recommendations  Thank you for your kind consultation, we will continue to follow.  Violet Baldy [facility]  10/23/2015, 9:17 AM Pager #: [phone]  ________________________________________________________________________  Corinda Gubler GI MD note:  I personally examined the patient, reviewed the data and agree with the assessment and plan described above.  He has RLQ pain, change in bowels, rather acutely. He's never had problems like this before.  CT shows distal ileum is abnormal (thickened, dilated).  Possibly this is IBD (crohn's) related however he's never had bowel symptoms in past, No FH of BID, symptoms are fairly acute and he tells [place] he had normal Colonoscopy [facility]) about 5 years; these all make IBD less likely.  He is an obvious vasculopath with Afib, CHF, history of blood clots and so I'm also concerned about mesenteric ischemia here. Probably that is more likely than IBD.  Infection is a possibility.  Would be unusual presentation for malignancy.  Colonoscopy MAY help and I'll plan on that for tomorrow.  We're also asking that surgery see the patient to comment on possible mesenteric ischemia.  I'm starting him on empiric IV antibiotics.  xarelto on hold for now, he's covered with IV heparin drip. We'll hold that 6 hours prior to colonoscopy.   Colonoscopy at 10AM, so heparin to be  held at 4am (pharmacy to assist with this, I spoke with them already).     Rob Bunting, MD [facility] Gastroenterology Pager [phone]

## 2015-10-23 NOTE — ED Notes (Signed)
[place], [place] notified of patients current heart rate

## 2015-10-23 NOTE — H&P (Addendum)
History and Physical    Leroy [name] MVH:[id number] DOB: [birth year] DOA: 10/22/2015   PCP: [name], MD Chief Complaint:  Chief Complaint  Patient presents with  . Abdominal Pain  . Emesis    HPI: Leroy [name] is a 70 y.o. male with medical history significant of atherosclerotic disease of multiple sites (PAD, MI, TIA, etc), DM2 on insulin, HTN.  Patient presents to the ED with c/o abdominal pain, N/V/D.  Pain is located in his RLQ, onset on Friday.  Has been worsening since that time.  Anything he eats gets vomited back up he says and pain becomes worse.  Can keep down water though.  ED Course: CT abd / pelvis shows regional enteritis and is worrisome for mesenteric ischemia, especially given his extensive vascular disease.  Lactate is slightly elevated initially at 2.4.  Review of Systems: As per HPI otherwise 10 point review of systems negative.    Past Medical History  Diagnosis Date  . Hyperlipidemia   . Ischemic cardiomyopathy     a. EF 40% 2010. b. Echo (2/14):  mild LVH, EF 30-35%, diff HK, Gr 1 DD, MAC, mild MR, mod LAE, PASP 39  . Cataract   . Hypotension   . Paroxysmal atrial fibrillation (HCC)   . Peripheral vascular disease (HCC)     a. s/p L BKA 2014.  [name] History of blood clots     in legs--this was in 2011--has been off of Coumadin 50months;takes Xarelto daily  . Joint pain   . H/O hiatal hernia   . Chronic kidney disease     on Lisinopril to protect kidneys d/t being on Metformin per pt  . Myocardial infarction (HCC)     total of 8 heart attacks;last one in 2011  . Atrial flutter (HCC)   . H/O medication noncompliance     Due to insurance issues  . Chronic systolic CHF (congestive heart failure) (HCC)   . Coronary artery disease     a. s/p remote CABG 2001 at [facility] b. Cath 03/2010: for med rx.;  c.  [name] myoview (1/14):  Large Inferior apical MI, very small area of peri-infarct ischemia toward the inferior apical segment. EF 19%.  Med Rx was  continued.    . Diabetes mellitus 1979    takes Metformni daily  and Lantus 50units bid  . DVT (deep venous thrombosis) (HCC) 2011    legs  . Peripheral neuropathy (HCC)   . Shortness of breath dyspnea   . Stroke [facility])     Past Surgical History  Procedure Laterality Date  . Revasculariztion  2011/2010    x 2   . Tonsillectomy    . Cardiac catheterization  03/19/10  . Abdominal aortagram  04-30-12  . Adenoidectomy    . Amputation Left 06/03/2012    Procedure: AMPUTATION BELOW KNEE;  Surgeon: [name], MD;  Location: [facility] OR;  Service: Vascular;  Laterality: Left;  . Coronary angioplasty  2002    3 stents  . Endarterectomy femoral Right 03/25/2013    Procedure: ENDARTERECTOMY FEMORAL ;  Surgeon: [name], MD;  Location: [facility] OR;  Service: Vascular;  Laterality: Right;  . Patch angioplasty Right 03/25/2013    Procedure: PATCH ANGIOPLASTY USING VASCU-GUARD PERIPHERAL VASCULAR PATCH;  Surgeon: [name], MD;  Location: [facility] OR;  Service: Vascular;  Laterality: Right;  . Amputation Right 04/14/2013    Procedure: AMPUTATION BELOW KNEE ;  Surgeon: [name], MD;  Location: [facility] OR;  Service: Vascular;  Laterality: Right;  . I&d extremity Right 04/14/2013    Procedure: IRRIGATION AND DEBRIDEMENT OF RIGHT  GROIN & PLACEMENT OF VAC DRESSING;  Surgeon: [name], MD;  Location: [facility] OR;  Service: Vascular;  Laterality: Right;  . Abdominal aortagram N/A 04/30/2012    Procedure: ABDOMINAL Ronny Flurry;  Surgeon: [name], MD;  Location: [facility] CATH LAB;  Service: Cardiovascular;  Laterality: N/A;  . Lower extremity angiogram Bilateral 04/30/2012    Procedure: LOWER EXTREMITY ANGIOGRAM;  Surgeon: [name], MD;  Location: [facility] CATH LAB;  Service: Cardiovascular;  Laterality: Bilateral;  bilat lower extrem angio  . Eye surgery Right     Catarct  . Coronary artery bypass graft  03/2000    quadruple  . Hernia repair      Hiatal Hernia  . Wound debridement Right 04/20/2014    s/p  rt bka  with  application of wound vac  . I&d extremity Right 04/20/2014    Procedure: DEBRIDEMENT OF RIGHT BELOW KNEE AMPUTATION STUMP;  Surgeon: [name], MD;  Location: [facility] OR;  Service: Vascular;  Laterality: Right;  . Application of wound vac Right 04/20/2014    Procedure: APPLICATION OF WOUND VAC;  Surgeon: [name], MD;  Location: [facility] OR;  Service: Vascular;  Laterality: Right;     reports that he has quit smoking. His smoking use included Cigarettes. He has never used smokeless tobacco. He reports that he does not drink alcohol or use illicit drugs.  Allergies  Allergen Reactions  . Statins Other (See Comments)    Reaction:  Muscle pain   . Amlodipine Besylate Other (See Comments)    Reaction:  Muscle pain   . Cephalexin Hives    Family History  Problem Relation Age of Onset  . Heart disease Mother     before age 64  . Diabetes Mother   . Heart attack Mother   . Stroke Mother   . Heart disease Father       Prior to Admission medications   Medication Sig Start Date End Date Taking? Authorizing Provider  aspirin EC 81 MG tablet Take 81 mg by mouth daily.   Yes Historical Provider, MD  Canagliflozin-Metformin HCl 50-1000 MG TABS Take 1 tablet by mouth 2 (two) times daily.    Yes Historical Provider, MD  ezetimibe (ZETIA) 10 MG tablet Take 1 tablet (10 mg total) by mouth daily. 05/07/13  Yes Daniel J Angiulli, PA-C  insulin aspart (NOVOLOG) 100 UNIT/ML injection Inject 10 Units into the skin 3 (three) times daily with meals.   Yes Historical Provider, MD  insulin glargine (LANTUS) 100 UNIT/ML injection Inject 50 Units into the skin 2 (two) times daily.   Yes Historical Provider, MD  lisinopril (PRINIVIL,ZESTRIL) 5 MG tablet Take 1 tablet (5 mg total) by mouth daily. 06/21/15  Yes Nilda Riggs, NP  metoprolol succinate (TOPROL-XL) 50 MG 24 hr tablet Take 50 mg by mouth daily. Take with or immediately following a meal.   Yes Historical Provider, MD  nitroGLYCERIN (NITROSTAT) 0.4  MG SL tablet Place 1 tablet (0.4 mg total) under the tongue every 5 (five) minutes as needed for chest pain. 05/07/13  Yes Daniel J Angiulli, PA-C  rivaroxaban (XARELTO) 20 MG TABS tablet Take 1 tablet (20 mg total) by mouth daily at 6 PM. 06/21/15  Yes Nilda Riggs, NP  rosuvastatin (CRESTOR) 20 MG tablet Take 1 tablet (20 mg total) by mouth daily at 6 PM. 06/21/15  Yes Nilda Riggs, NP    Physical Exam: Filed Vitals:   10/23/15 0050 10/23/15 0055 10/23/15 0229 10/23/15 0230  BP: 111/59  135/57 111/70  Pulse: 159 123 109 110  Temp:      TempSrc:      Resp: 28 26 31 24   SpO2: 96% 97% 98% 98%      Constitutional: NAD, calm, comfortable Eyes: PERRL, lids and conjunctivae normal ENMT: Mucous membranes are moist. Posterior pharynx clear of any exudate or lesions.Normal dentition.  Neck: normal, supple, no masses, no thyromegaly Respiratory: clear to auscultation bilaterally, no wheezing, no crackles. Normal respiratory effort. No accessory muscle use.  Cardiovascular: Regular rate and rhythm, no murmurs / rubs / gallops. No extremity edema. 2+ pedal pulses. No carotid bruits.  Abdomen: no tenderness, no masses palpated. No hepatosplenomegaly. Bowel sounds positive.  Musculoskeletal: no clubbing / cyanosis. No joint deformity upper and lower extremities. Good ROM, no contractures. Normal muscle tone.  Skin: no rashes, lesions, ulcers. No induration Neurologic: CN 2-12 grossly intact. Sensation intact, DTR normal. Strength 5/5 in all 4.  Psychiatric: Normal judgment and insight. Alert and oriented x 3. Normal mood.    Labs on Admission: I have personally reviewed following labs and imaging studies  CBC:  Recent Labs Lab 10/22/15 1826  WBC 15.1*  HGB 17.0  HCT 48.2  MCV 87.3  PLT 184   Basic Metabolic Panel:  Recent Labs Lab 10/22/15 2046  NA 133*  K 4.1  CL 98*  CO2 28  GLUCOSE 260*  BUN 12  CREATININE 1.26*  CALCIUM 8.9   GFR: CrCl cannot be  calculated (Unknown ideal weight.). Liver Function Tests: No results for input(s): AST, ALT, ALKPHOS, BILITOT, PROT, ALBUMIN in the last 168 hours.  Recent Labs Lab 10/22/15 2046  LIPASE 14   No results for input(s): AMMONIA in the last 168 hours. Coagulation Profile: No results for input(s): INR, PROTIME in the last 168 hours. Cardiac Enzymes: No results for input(s): CKTOTAL, CKMB, CKMBINDEX, TROPONINI in the last 168 hours. BNP (last 3 results) No results for input(s): PROBNP in the last 8760 hours. HbA1C: No results for input(s): HGBA1C in the last 72 hours. CBG: No results for input(s): GLUCAP in the last 168 hours. Lipid Profile: No results for input(s): CHOL, HDL, LDLCALC, TRIG, CHOLHDL, LDLDIRECT in the last 72 hours. Thyroid Function Tests: No results for input(s): TSH, T4TOTAL, FREET4, T3FREE, THYROIDAB in the last 72 hours. Anemia Panel: No results for input(s): VITAMINB12, FOLATE, FERRITIN, TIBC, IRON, RETICCTPCT in the last 72 hours. Urine analysis:    Component Value Date/Time   COLORURINE AMBER* 10/22/2015 2336   APPEARANCEUR CLEAR 10/22/2015 2336   LABSPEC 1.025 10/22/2015 2336   PHURINE 5.5 10/22/2015 2336   GLUCOSEU >1000* 10/22/2015 2336   GLUCOSEU 100 04/15/2012 1222   HGBUR MODERATE* 10/22/2015 2336   BILIRUBINUR NEGATIVE 10/22/2015 2336   KETONESUR NEGATIVE 10/22/2015 2336   PROTEINUR 100* 10/22/2015 2336   UROBILINOGEN 1.0 04/16/2013 1718   NITRITE NEGATIVE 10/22/2015 2336   LEUKOCYTESUR NEGATIVE 10/22/2015 2336   Sepsis Labs: @LABRCNTIP (procalcitonin:4,lacticidven:4) )No results found for this or any previous visit (from the past 240 hour(s)).   Radiological Exams on Admission: Dg Chest 2 View  10/22/2015  CLINICAL DATA:  Shortness of breath. EXAM: CHEST  2 VIEW COMPARISON:  April 16, 2013 FINDINGS: The heart size borderline. No pulmonary nodules, masses, or infiltrates. No pneumothorax. IMPRESSION: No active cardiopulmonary disease.  Electronically Signed   By: Gerome Sam III M.D  On: 10/22/2015 20:56   Ct Abdomen Pelvis W Contrast  10/22/2015  CLINICAL DATA:  Right lower quadrant pain. EXAM: CT ABDOMEN AND PELVIS WITH CONTRAST TECHNIQUE: Multidetector CT imaging of the abdomen and pelvis was performed using the standard protocol following bolus administration of intravenous contrast. CONTRAST:  ISOVUE-300 IOPAMIDOL (ISOVUE-300) INJECTION 61% COMPARISON:  None. FINDINGS: Lower chest and abdominal wall: Remote left ventricular infarct with subendocardial scarring along the septum. Hepatobiliary: No focal liver abnormality.No evidence of biliary obstruction or stone. Pancreas: Unremarkable. Spleen: Unremarkable. Adrenals/Urinary Tract: Negative adrenals. Patchy renal cortical scarring, right worse than left. No hydronephrosis or ureteral calculus. Unremarkable bladder. Stomach/Bowel: Dilated ileal loops containing fluid and fecalized contents with mild mesenteric congestion. Transition point the terminal ileum there is borderline wall thickening. No causative mass is seen. Extensive atherosclerosis, without visible or proximal occlusion; affected segments have enhancing walls. No signs of bowel necrosis. Few uncomplicated colonic diverticula. Distal colonic diverticulum. Reproductive:No pathologic findings. Vascular/Lymphatic: Extensive aortic and branch vessel atherosclerosis. Remote vascular surgery in the bilateral groin. Poor or no enhancement at the level of the right left SFA. Vascular evaluation is limited by contrast timing. No acute visceral artery finding. Other: No ascites or pneumoperitoneum. Musculoskeletal: No acute abnormalities. Advanced lower lumbar facet arthropathy. Sacroiliac ankylosis with spurring. IMPRESSION: 1. Partial ileal obstruction without definitive underlying cause. Questionable regional enteritis. Patient has severe atherosclerosis, correlate with lactate. 2. Poor or no enhancement at the bilateral  SFA. Remote left ventricular infarct. 3. Bilateral renal cortical scarring. Electronically Signed   By: Marnee Spring M.D.   On: 10/22/2015 23:34    EKG: Independently reviewed.  Assessment/Plan Principal Problem:   RLQ abdominal pain Active Problems:   ATRIAL FIBRILLATION, PAROXYSMAL   Diabetes mellitus type 2 with peripheral artery disease (HCC)   Nausea and vomiting   RLQ abdominal pain -  Clear liquid diet only  IVF  Nausea and pain control  Trend lactates  DM2 -  Reducing lantus to 25u BID  Moderate dose SSI Q4H while on low PO intake  PAF - hold Xarelto and switch to heparin gtt  HLD - continue statin  PSVT - had this initially in ED with HR of 160s, resolved with adenosine and has been in sinus tach of 100s-110s ever since.  Has known h/o PSVT in past.   DVT prophylaxis: Converting Xarelto to heparin gtt in case this turns surgical Code Status: Full Family Communication: No family in room Consults called: None Admission status: Admit to inpatient   Hillary Bow DO [facility] Pager [phone] from 7PM-7AM  If 7AM-7PM, please contact the day physician for the patient [email or web address] Password [email or web address]  10/23/2015, 3:06 AM

## 2015-10-24 ENCOUNTER — Encounter (HOSPITAL_COMMUNITY): Payer: Self-pay | Admitting: *Deleted

## 2015-10-24 ENCOUNTER — Inpatient Hospital Stay (HOSPITAL_COMMUNITY): Payer: Commercial Managed Care - HMO

## 2015-10-24 ENCOUNTER — Encounter (HOSPITAL_COMMUNITY)
Admission: EM | Disposition: A | Discharge status: Home / Self Care | Payer: Self-pay | Source: Home / Self Care | Attending: Internal Medicine

## 2015-10-24 ENCOUNTER — Inpatient Hospital Stay (HOSPITAL_COMMUNITY): Payer: Commercial Managed Care - HMO | Admitting: Anesthesiology

## 2015-10-24 DIAGNOSIS — K559 Vascular disorder of intestine, unspecified: Principal | ICD-10-CM

## 2015-10-24 DIAGNOSIS — I471 Supraventricular tachycardia: Secondary | ICD-10-CM

## 2015-10-24 HISTORY — PX: COLONOSCOPY WITH PROPOFOL: SHX5780

## 2015-10-24 LAB — BASIC METABOLIC PANEL
Anion gap: 11 (ref 5–15)
BUN: 22 mg/dL — AB (ref 6–20)
CHLORIDE: 102 mmol/L (ref 101–111)
CO2: 23 mmol/L (ref 22–32)
Calcium: 8.4 mg/dL — ABNORMAL LOW (ref 8.9–10.3)
Creatinine, Ser: 1.67 mg/dL — ABNORMAL HIGH (ref 0.61–1.24)
GFR calc Af Amer: 47 mL/min — ABNORMAL LOW (ref >60)
GFR calc non Af Amer: 40 mL/min — ABNORMAL LOW (ref >60)
Glucose, Bld: 146 mg/dL — ABNORMAL HIGH (ref 65–99)
POTASSIUM: 4 mmol/L (ref 3.5–5.1)
SODIUM: 136 mmol/L (ref 135–145)

## 2015-10-24 LAB — CBC
HCT: 45.9 % (ref 39.0–52.0)
HEMOGLOBIN: 15.6 g/dL (ref 13.0–17.0)
MCH: 30.6 pg (ref 26.0–34.0)
MCHC: 34 g/dL (ref 30.0–36.0)
MCV: 90.2 fL (ref 78.0–100.0)
Platelets: 171 10*3/uL (ref 150–400)
RBC: 5.09 MIL/uL (ref 4.22–5.81)
RDW: 13.2 % (ref 11.5–15.5)
WBC: 16.4 10*3/uL — ABNORMAL HIGH (ref 4.0–10.5)

## 2015-10-24 LAB — GLUCOSE, CAPILLARY
GLUCOSE-CAPILLARY: 120 mg/dL — AB (ref 65–99)
GLUCOSE-CAPILLARY: 136 mg/dL — AB (ref 65–99)
GLUCOSE-CAPILLARY: 151 mg/dL — AB (ref 65–99)
GLUCOSE-CAPILLARY: 187 mg/dL — AB (ref 65–99)
Glucose-Capillary: 133 mg/dL — ABNORMAL HIGH (ref 65–99)
Glucose-Capillary: 143 mg/dL — ABNORMAL HIGH (ref 65–99)
Glucose-Capillary: 118 mg/dL — ABNORMAL HIGH (ref 65–99)
Glucose-Capillary: 183 mg/dL — ABNORMAL HIGH (ref 65–99)

## 2015-10-24 LAB — MAGNESIUM: Magnesium: 1.9 mg/dL (ref 1.7–2.4)

## 2015-10-24 LAB — HEPARIN LEVEL (UNFRACTIONATED): Heparin Unfractionated: 0.37 IU/mL (ref 0.30–0.70)

## 2015-10-24 SURGERY — COLONOSCOPY WITH PROPOFOL
Anesthesia: Monitor Anesthesia Care

## 2015-10-24 MED ORDER — SODIUM CHLORIDE 0.9 % IV SOLN
INTRAVENOUS | Status: AC
  Administered 2015-10-24: 10:00:00 via INTRAVENOUS

## 2015-10-24 MED ORDER — HEPARIN (PORCINE) IN NACL 100-0.45 UNIT/ML-% IJ SOLN
1600.0000 [IU]/h | INTRAMUSCULAR | Status: DC
  Administered 2015-10-24 – 2015-10-26 (×3): 1600 [IU]/h via INTRAVENOUS
  Filled 2015-10-24 (×9): qty 250

## 2015-10-24 MED ORDER — MIDAZOLAM HCL 2 MG/2ML IJ SOLN
INTRAMUSCULAR | Status: AC
  Filled 2015-10-24: qty 2

## 2015-10-24 MED ORDER — SODIUM CHLORIDE 0.9 % IV BOLUS (SEPSIS)
500.0000 mL | Freq: Once | INTRAVENOUS | Status: AC
  Administered 2015-10-24: 500 mL via INTRAVENOUS

## 2015-10-24 MED ORDER — ONDANSETRON HCL 4 MG/2ML IJ SOLN
INTRAMUSCULAR | Status: AC
  Filled 2015-10-24: qty 2

## 2015-10-24 MED ORDER — PHENYLEPHRINE HCL 10 MG/ML IJ SOLN
INTRAMUSCULAR | Status: DC | PRN
  Administered 2015-10-24 (×2): 80 ug via INTRAVENOUS

## 2015-10-24 MED ORDER — METOPROLOL SUCCINATE ER 50 MG PO TB24
75.0000 mg | ORAL_TABLET | Freq: Every day | ORAL | Status: DC
  Administered 2015-10-25 – 2015-10-29 (×5): 75 mg via ORAL
  Filled 2015-10-24 (×3): qty 1
  Filled 2015-10-24 (×2): qty 3
  Filled 2015-10-24: qty 1

## 2015-10-24 MED ORDER — ISOSORBIDE MONONITRATE 15 MG HALF TABLET
15.0000 mg | ORAL_TABLET | Freq: Every day | ORAL | Status: DC
  Filled 2015-10-24 (×2): qty 1

## 2015-10-24 MED ORDER — FENTANYL CITRATE (PF) 100 MCG/2ML IJ SOLN
INTRAMUSCULAR | Status: AC
  Filled 2015-10-24: qty 2

## 2015-10-24 MED ORDER — ONDANSETRON HCL 4 MG/2ML IJ SOLN
INTRAMUSCULAR | Status: DC | PRN
  Administered 2015-10-24: 4 mg via INTRAVENOUS

## 2015-10-24 MED ORDER — PROPOFOL 500 MG/50ML IV EMUL
INTRAVENOUS | Status: DC | PRN
Start: 2015-10-24 — End: 2015-10-24
  Administered 2015-10-24: 75 ug/kg/min via INTRAVENOUS

## 2015-10-24 MED ORDER — PHENYLEPHRINE 40 MCG/ML (10ML) SYRINGE FOR IV PUSH (FOR BLOOD PRESSURE SUPPORT)
PREFILLED_SYRINGE | INTRAVENOUS | Status: AC
  Filled 2015-10-24: qty 10

## 2015-10-24 MED ORDER — METOPROLOL TARTRATE 5 MG/5ML IV SOLN: 5.0000 mg | Freq: Once | INTRAVENOUS | Status: DC

## 2015-10-24 MED ORDER — SODIUM CHLORIDE 0.9 % IV SOLN
INTRAVENOUS | Status: DC | PRN
  Administered 2015-10-24: 11:00:00 via INTRAVENOUS

## 2015-10-24 MED ORDER — METOPROLOL TARTRATE 5 MG/5ML IV SOLN
5.0000 mg | Freq: Once | INTRAVENOUS | Status: AC
  Administered 2015-10-24: 5 mg via INTRAVENOUS

## 2015-10-24 MED ORDER — FLEET ENEMA 7-19 GM/118ML RE ENEM
1.0000 | ENEMA | Freq: Once | RECTAL | Status: AC
  Administered 2015-10-24: 1 via RECTAL
  Filled 2015-10-24: qty 1

## 2015-10-24 MED ORDER — LIDOCAINE HCL (CARDIAC) 20 MG/ML IV SOLN
INTRAVENOUS | Status: AC
  Filled 2015-10-24: qty 5

## 2015-10-24 MED ORDER — PROPOFOL 10 MG/ML IV BOLUS
INTRAVENOUS | Status: AC
  Filled 2015-10-24: qty 40

## 2015-10-24 MED ORDER — FENTANYL CITRATE (PF) 100 MCG/2ML IJ SOLN
INTRAMUSCULAR | Status: DC | PRN
  Administered 2015-10-24: 50 ug via INTRAVENOUS

## 2015-10-24 MED ORDER — HYDRALAZINE HCL 25 MG PO TABS
25.0000 mg | ORAL_TABLET | Freq: Three times a day (TID) | ORAL | Status: DC
  Administered 2015-10-24 – 2015-10-25 (×2): 25 mg via ORAL
  Filled 2015-10-24 (×2): qty 1

## 2015-10-24 MED ORDER — MIDAZOLAM HCL 5 MG/5ML IJ SOLN
INTRAMUSCULAR | Status: DC | PRN
  Administered 2015-10-24: 1 mg via INTRAVENOUS

## 2015-10-24 SURGICAL SUPPLY — 22 items

## 2015-10-24 NOTE — Consult Note (Signed)
Patient ID: Leroy [name] MRN: [id number], DOB/AGE: 69-24-1947   Reason for Consult: sCHF, SVT Requesting MD: Dr. Thedore Mins  Admit date: 10/22/2015   Primary Physician: [name], MD Primary Cardiologist: Dr. Antoine Poche (last seen in 2014)  Pt. Profile:  70 y/o male with a h/o CAD s/p CABG, atrial fibrillation/ SVT on chronic anticoagulation with Xarelto, ischemic cardiomyopathy/ chronic systolic HF with EF of 25-30%, PVD s/p bilateral BKA, T2DM, HLD, CKD and prior CVA, who presented to [facility] 10/23/15 with complaint of RLQ abdominal pain + emesis. Abd CT suggested acute enteritis involving the ileum with partial obstruction. Suspicion for ischemic bowel his high>> plan for colonoscopy 7/18. Cards consulted for management of CHF + SVT.   Problem List  Past Medical History  Diagnosis Date  . Hyperlipidemia   . Ischemic cardiomyopathy     a. EF 40% 2010. b. Echo (2/14):  mild LVH, EF 30-35%, diff HK, Gr 1 DD, MAC, mild MR, mod LAE, PASP 39  . Cataract   . Hypotension   . Paroxysmal atrial fibrillation (HCC)   . Peripheral vascular disease (HCC)     a. s/p L BKA 2014.  [name] History of blood clots     in legs--this was in 2011--has been off of Coumadin 72months;takes Xarelto daily  . Joint pain   . H/O hiatal hernia   . Chronic kidney disease     on Lisinopril to protect kidneys d/t being on Metformin per pt  . Myocardial infarction (HCC)     total of 8 heart attacks;last one in 2011  . Atrial flutter (HCC)   . H/O medication noncompliance     Due to insurance issues  . Chronic systolic CHF (congestive heart failure) (HCC)   . Coronary artery disease     a. s/p remote CABG 2001 at [facility] b. Cath 03/2010: for med rx.;  c.  [name] myoview (1/14):  Large Inferior apical MI, very small area of peri-infarct ischemia toward the inferior apical segment. EF 19%.  Med Rx was continued.    . Diabetes mellitus 1979    takes Metformni daily  and Lantus 50units bid  . DVT (deep venous  thrombosis) (HCC) 2011    legs  . Peripheral neuropathy (HCC)   . Shortness of breath dyspnea   . Stroke [facility])     Past Surgical History  Procedure Laterality Date  . Revasculariztion  2011/2010    x 2   . Tonsillectomy    . Cardiac catheterization  03/19/10  . Abdominal aortagram  04-30-12  . Adenoidectomy    . Amputation Left 06/03/2012    Procedure: AMPUTATION BELOW KNEE;  Surgeon: [name], MD;  Location: [facility] OR;  Service: Vascular;  Laterality: Left;  . Coronary angioplasty  2002    3 stents  . Endarterectomy femoral Right 03/25/2013    Procedure: ENDARTERECTOMY FEMORAL ;  Surgeon: [name], MD;  Location: [facility] OR;  Service: Vascular;  Laterality: Right;  . Patch angioplasty Right 03/25/2013    Procedure: PATCH ANGIOPLASTY USING VASCU-GUARD PERIPHERAL VASCULAR PATCH;  Surgeon: [name], MD;  Location: [facility] OR;  Service: Vascular;  Laterality: Right;  . Amputation Right 04/14/2013    Procedure: AMPUTATION BELOW KNEE ;  Surgeon: [name], MD;  Location: [facility] OR;  Service: Vascular;  Laterality: Right;  . I&d extremity Right 04/14/2013    Procedure: IRRIGATION AND DEBRIDEMENT OF RIGHT  GROIN & PLACEMENT OF VAC DRESSING;  Surgeon: [name], MD;  Location: [facility] OR;  Service: Vascular;  Laterality: Right;  . Abdominal aortagram N/A 04/30/2012    Procedure: ABDOMINAL AORTAGRAM;  Surgeon: [name], MD;  Location: [facility] CATH LAB;  Service: Cardiovascular;  Laterality: N/A;  . Lower extremity angiogram Bilateral 04/30/2012    Procedure: LOWER EXTREMITY ANGIOGRAM;  Surgeon: [name], MD;  Location: [facility] CATH LAB;  Service: Cardiovascular;  Laterality: Bilateral;  bilat lower extrem angio  . Eye surgery Right     Catarct  . Coronary artery bypass graft  03/2000    quadruple  . Hernia repair      Hiatal Hernia  . Wound debridement Right 04/20/2014    s/p  rt bka  with application of wound vac  . I&d extremity Right 04/20/2014    Procedure: DEBRIDEMENT OF RIGHT BELOW KNEE  AMPUTATION STUMP;  Surgeon: [name], MD;  Location: [facility] OR;  Service: Vascular;  Laterality: Right;  . Application of wound vac Right 04/20/2014    Procedure: APPLICATION OF WOUND VAC;  Surgeon: [name], MD;  Location: [facility] OR;  Service: Vascular;  Laterality: Right;     Allergies  Allergies  Allergen Reactions  . Statins Other (See Comments)    Reaction:  Muscle pain   . Amlodipine Besylate Other (See Comments)    Reaction:  Muscle pain   . Cephalexin Hives    HPI  70 y/o male, followed in the past by Dr. Antoine Poche, with a hx of CAD, s/p CABG in 2001, DM2, HL, atrial fibrillation, ischemic cardiomyopathy, systolic CHF. LHC 12/11: SVG-OM patent, SVG-RCA occluded, native RCA with severe diffuse disease and chronic total occlusion, LIMA-LAD patent, medical therapy pursued. Echo 10/13: EF 35-40%. Echo (2/14): mild LVH, EF 30-35%, diff HK, Gr 1 DD, MAC, mild MR, mod LAE, PASP 39. Lexiscan myoview (1/14): Large Inferior apical MI, very small area of peri-infarct ischemia toward the inferior apical segment. EF 19%. Med Rx was continued. He had a non-healing leg wound and is s/p bilateral BKA. He was evaluated in the hospital in 05/2012 with SVT while off of medications. Beta blocker therapy was reinitiated. He has not been seen in clinic since 2014. Since then, he has had a repeat echo 05/23/15 which showed further reduction in EF down to 25-30%  He presented to the [facility] ED 10/13/15 with complaint of RLQ abdominal pain + emesis. Abd CT suggested acute enteritis involving the ileum with partial obstruction. Suspicion for ischemic bowel is high>> plan for colonoscopy 7/18.   Cardiology has been consulted to help manage his systolic HF and arrthymias. He has been having burst of SVT on telemetry.   He denies any recent cardiac symptoms. No chest pain. No dyspnea, orthopnea nor PND. He occasionally notes palpitations but no significant symptoms since being admitted. He appears well compensated.  Volume stable. Currently in SR with HR in the 90s. Systolic BP is soft in the low 100s.   Home Medications  Prior to Admission medications   Medication Sig Start Date End Date Taking? Authorizing Provider  aspirin EC 81 MG tablet Take 81 mg by mouth daily.   Yes Historical Provider, MD  Canagliflozin-Metformin HCl 50-1000 MG TABS Take 1 tablet by mouth 2 (two) times daily.    Yes Historical Provider, MD  ezetimibe (ZETIA) 10 MG tablet Take 1 tablet (10 mg total) by mouth daily. 05/07/13  Yes Daniel J Angiulli, PA-C  insulin aspart (NOVOLOG) 100 UNIT/ML injection Inject 10 Units into the skin 3 (three) times daily  as needed for high blood sugar (BLOOD SUGARS GREATER THAN 200).    Yes Historical Provider, MD  insulin glargine (LANTUS) 100 UNIT/ML injection Inject 50 Units into the skin 2 (two) times daily.   Yes Historical Provider, MD  lisinopril (PRINIVIL,ZESTRIL) 5 MG tablet Take 1 tablet (5 mg total) by mouth daily. 06/21/15  Yes Nilda Riggs, NP  metoprolol succinate (TOPROL-XL) 50 MG 24 hr tablet Take 50 mg by mouth daily. Take with or immediately following a meal.   Yes Historical Provider, MD  nitroGLYCERIN (NITROSTAT) 0.4 MG SL tablet Place 1 tablet (0.4 mg total) under the tongue every 5 (five) minutes as needed for chest pain. 05/07/13  Yes Daniel J Angiulli, PA-C  rivaroxaban (XARELTO) 20 MG TABS tablet Take 1 tablet (20 mg total) by mouth daily at 6 PM. 06/21/15  Yes Nilda Riggs, NP    Family History  Family History  Problem Relation Age of Onset  . Heart disease Mother     before age 87  . Diabetes Mother   . Heart attack Mother   . Stroke Mother   . Heart disease Father     Social History  Social History   Social History  . Marital Status: Legally Separated    Spouse Name: N/A  . Number of Children: N/A  . Years of Education: N/A   Occupational History  . Not on file.   Social History Main Topics  . Smoking status: Former Smoker    Types:  Cigarettes  . Smokeless tobacco: Never Used     Comment: quit smoking 58yrs ago  . Alcohol Use: No     Comment: occasionally  . Drug Use: No  . Sexual Activity: Not Currently   Other Topics Concern  . Not on file   Social History Narrative     Review of Systems General:  No chills, fever, night sweats or weight changes.  Cardiovascular:  No chest pain, dyspnea on exertion, edema, orthopnea, palpitations, paroxysmal nocturnal dyspnea. Dermatological: No rash, lesions/masses Respiratory: No cough, dyspnea Urologic: No hematuria, dysuria Abdominal:   No nausea, vomiting, diarrhea, bright red blood per rectum, melena, or hematemesis Neurologic:  No visual changes, wkns, changes in mental status. All other systems reviewed and are otherwise negative except as noted above.  Physical Exam  Blood pressure 130/41, pulse 96, temperature 99.9 F (37.7 C), temperature source Oral, resp. rate 28, height 5\' 9"  (1.753 m), weight 282 lb 6.6 oz (128.1 kg), SpO2 95 %.  General: Pleasant, NAD, moderately obese Psych: Normal affect. Neuro: Alert and oriented X 3. Moves all extremities spontaneously. HEENT: Normal  Neck: Supple without bruits or JVD. Lungs:  Resp regular and unlabored, CTA. Heart: RRR no s3, s4, or murmurs. Abdomen: Soft, RLQ tenderness, non-distended, BS + x 4.  Extremities: s/p bilateral BKA. no edema. Radials 2+ and equal bilaterally.  Labs  Troponin [facility] of Care Test)  Recent Labs  10/23/15 0008  TROPIPOC 0.00   No results for input(s): CKTOTAL, CKMB, TROPONINI in the last 72 hours. Lab Results  Component Value Date   WBC 16.4* 10/24/2015   HGB 15.6 10/24/2015   HCT 45.9 10/24/2015   MCV 90.2 10/24/2015   PLT 171 10/24/2015    Recent Labs Lab 10/24/15 0100  NA 136  K 4.0  CL 102  CO2 23  BUN 22*  CREATININE 1.67*  CALCIUM 8.4*  GLUCOSE 146*   Lab Results  Component Value Date   CHOL 209* 05/23/2015  HDL 45 05/23/2015   LDLCALC 142* 05/23/2015    TRIG 108 05/23/2015   No results found for: DDIMER   Radiology/Studies  Dg Chest 2 View  10/22/2015  CLINICAL DATA:  Shortness of breath. EXAM: CHEST  2 VIEW COMPARISON:  April 16, 2013 FINDINGS: The heart size borderline. No pulmonary nodules, masses, or infiltrates. No pneumothorax. IMPRESSION: No active cardiopulmonary disease. Electronically Signed   By: Gerome Sam III M.D   On: 10/22/2015 20:56   Ct Abdomen Pelvis W Contrast  10/22/2015  CLINICAL DATA:  Right lower quadrant pain. EXAM: CT ABDOMEN AND PELVIS WITH CONTRAST TECHNIQUE: Multidetector CT imaging of the abdomen and pelvis was performed using the standard protocol following bolus administration of intravenous contrast. CONTRAST:  ISOVUE-300 IOPAMIDOL (ISOVUE-300) INJECTION 61% COMPARISON:  None. FINDINGS: Lower chest and abdominal wall: Remote left ventricular infarct with subendocardial scarring along the septum. Hepatobiliary: No focal liver abnormality.No evidence of biliary obstruction or stone. Pancreas: Unremarkable. Spleen: Unremarkable. Adrenals/Urinary Tract: Negative adrenals. Patchy renal cortical scarring, right worse than left. No hydronephrosis or ureteral calculus. Unremarkable bladder. Stomach/Bowel: Dilated ileal loops containing fluid and fecalized contents with mild mesenteric congestion. Transition point the terminal ileum there is borderline wall thickening. No causative mass is seen. Extensive atherosclerosis, without visible or proximal occlusion; affected segments have enhancing walls. No signs of bowel necrosis. Few uncomplicated colonic diverticula. Distal colonic diverticulum. Reproductive:No pathologic findings. Vascular/Lymphatic: Extensive aortic and branch vessel atherosclerosis. Remote vascular surgery in the bilateral groin. Poor or no enhancement at the level of the right left SFA. Vascular evaluation is limited by contrast timing. No acute visceral artery finding. Other: No ascites or  pneumoperitoneum. Musculoskeletal: No acute abnormalities. Advanced lower lumbar facet arthropathy. Sacroiliac ankylosis with spurring. IMPRESSION: 1. Partial ileal obstruction without definitive underlying cause. Questionable regional enteritis. Patient has severe atherosclerosis, correlate with lactate. 2. Poor or no enhancement at the bilateral SFA. Remote left ventricular infarct. 3. Bilateral renal cortical scarring. Electronically Signed   By: Marnee Spring M.D.   On: 10/22/2015 23:34   Dg Abd 2 Views  10/23/2015  CLINICAL DATA:  Generalized abdominal pain. EXAM: ABDOMEN - 2 VIEW COMPARISON:  CT scan of October 22, 2015. FINDINGS: The bowel gas pattern is normal. There is no evidence of free air. No radio-opaque calculi or other significant radiographic abnormality is seen. IMPRESSION: No evidence of bowel obstruction or ileus. Electronically Signed   By: Lupita Raider, M.D.   On: 10/23/2015 15:03    ECG  10/22/15- wide QRS tachycardiac  Echocardiogram 05-23-15  Study Conclusions  - Left ventricle: The cavity size was normal. Systolic function was  severely reduced. The estimated ejection fraction was in the  range of 25% to 30%. Diffuse hypokinesis. There was an increased  relative contribution of atrial contraction to ventricular  filling. Doppler parameters are consistent with abnormal left  ventricular relaxation (grade 1 diastolic dysfunction). - Aortic valve: Trileaflet; mildly thickened, mildly calcified  leaflets. - Mitral valve: Calcified annulus. - Pulmonary arteries: PA peak pressure: 39 mm Hg (S).  Impressions:  - The right ventricular systolic pressure was increased consistent  with mild pulmonary hypertension.     ASSESSMENT AND PLAN  Principal Problem:   RLQ abdominal pain Active Problems:   ATRIAL FIBRILLATION, PAROXYSMAL   Diabetes mellitus type 2 with peripheral artery disease (HCC)   Nausea and vomiting   1. RLQ Abdominal Pain: CT suggested  acute enteritis involving the ileum with partial obstruction. Suspicion for ischemic bowel his  high>> on Cipro + flagyl>> plan for colonoscopy 7/18. Given his ischemic cardiomyopathy with EF of 25%, he would be high risk, however GI procedures are non avoidable. Continue ASA (if ok with surgery), BB and satin. We will monitor closely along with you.  2. Ischemic Cardiomyopathy/ Chronic Systolic CHF: EF 78% on most recent echo 05/2015. He appears well compensated. Volume is stable by exam, however I/Os net + 3L. No significant dyspnea, orthopnea nor PND.  He is receiving IVFs. Will need to monitor fluid status closely. Continued medical therapy for systolic HF, including ACE-I, BB, hydralazine + nitrate. PRN lasix, if needed for volume.   3. Atrial Fibrillation/ SVT: Currently SR on telemetry with rate in the 90s. He has had burst of SVT on telemetry, responsive to IV adenosine and IV metoprolol. K and Mg both stable. Underlying GI illness + pain may be contributing to rapid rates. Continue PO metoprolol for rate control, 75 mg daily. Can further titrate if BP allows. Not a candidate for Cardizem given low EF. Can continue PRN IV metoprolol or adenosine if needed. Keep K and Mg WNL. This patients CHA2DS2-VASc Score and unadjusted Ischemic Stroke Rate (% per year) is equal to 11.2 % stroke rate/year from a score of 7 (CHF, HTN, Age 52-74, DM, prior CVA(2), Vascular disease).  Above score calculated as 1 point each if present [CHF, HTN, DM, Vascular=MI/PAD/Aortic Plaque, Age if 65-74, or Male] Above score calculated as 2 points each if present [Age > 75, or Stroke/TIA/TE]  He has been on chronic anticoagulation with Xarelto as an outpatient. This is currently on hold for planned colonoscopy/ possible surgeries. Given calculated stroke risk as outlined above, please resume Xarelto once cleared by General surgery/ GI.   MD to follow with further recommendations.    Signed, Robbie Lis,  PA-C 10/24/2015, 7:41 AM   History and all data above reviewed.  Patient examined.  I agree with the findings as above.  The patient denies any recent history.  He has had no SOB or chest pain.  He denies any palpitations.  He pushes himself around and up a ramp.  He has been noted to have SVT since admission.  The patient exam reveals COR:RRR  ,  Lungs: Clear  ,  Abd: Positive bowel sounds, no rebound no guarding, Ext Status post bilateral BKA  .  All available labs, radiology testing, previous records reviewed. EKG reviewed.  Narrow complex arrhythmia is consistent with SVT.   Agree with documented assessment and plan. No edema   Agree with suggestions as above.  He is not a candidate for ablation.  It looks like his metoprolol was held for the last day or two.  We need to make sure he gets this if his BP is above 100.  CHF:  He has chronic systolic heart failure secondary to inoperable non revascularizable CAD.  However, he seems to be euvolemic.  No change in therapy is planned.       Fayrene Fearing Andjela Wickes  1:53 PM  10/24/2015

## 2015-10-24 NOTE — H&P (View-Only) (Signed)
Consultation  Referring Provider: Dr. Thedore Mins     Primary Care Physician:  [name], MD Primary Gastroenterologist: None     Reason for Consultation: RLQ pain            HPI:   Leroy [name] is a 70 y.o. male with a past medical history significant for atherosclerotic disease of multiple sites (PAD, MI, TIA...) on chronic anticoagulation with Xarelto, Diabetes type 2 on insulin, bilateral BKA and hypertension who presented to the ER on 10/22/15 with a chief complaint of abdominal pain, nausea, vomiting and diarrhea.  Today, the patient expresses that on Friday of last week, 10/20/15 he developed a right lower quadrant pain when he awoke in the morning and started "getting up and moving around", on that same day he started with loose bowel movements, multiple. He does tell me that recently he has been eating a lot of fast unhealthy food, and thought this may be related. Patient's pain as a constant 9/10 which is not helped by anything but is made worse when he "lays on that side". Patient did try taking some Pepto-Bismol and tells me that this did help a little bit. The patient denies any chronic heartburn or reflux symptoms. He expresses that he has continued to have multiple loose stools per day with his last stool yesterday. He does express having 3 "black" stools yesterday. He has also had multiple episodes of nausea and vomiting since Friday with his last being yesterday before time of admission.  Patient denies fever, chills, anorexia, dysphagia, hematemesis, previous episodes of the same, use of NSAIDs or alcohol.  ED course: CT abdomen/pelvis shows regional enteritis and is worrisome for mesenteric ischemia, especially given his extensive vascular disease. Lactate slightly elevated at 2.4.  Past Medical History  Diagnosis Date  . Hyperlipidemia   . Ischemic cardiomyopathy     a. EF 40% 2010. b. Echo (2/14):  mild LVH, EF 30-35%, diff HK, Gr 1 DD, MAC, mild MR, mod LAE, PASP 39  .  Cataract   . Hypotension   . Paroxysmal atrial fibrillation (HCC)   . Peripheral vascular disease (HCC)     a. s/p L BKA 2014.  [name] History of blood clots     in legs--this was in 2011--has been off of Coumadin 47months;takes Xarelto daily  . Joint pain   . H/O hiatal hernia   . Chronic kidney disease     on Lisinopril to protect kidneys d/t being on Metformin per pt  . Myocardial infarction (HCC)     total of 8 heart attacks;last one in 2011  . Atrial flutter (HCC)   . H/O medication noncompliance     Due to insurance issues  . Chronic systolic CHF (congestive heart failure) (HCC)   . Coronary artery disease     a. s/p remote CABG 2001 at [facility] b. Cath 03/2010: for med rx.;  c.  [name] myoview (1/14):  Large Inferior apical MI, very small area of peri-infarct ischemia toward the inferior apical segment. EF 19%.  Med Rx was continued.    . Diabetes mellitus 1979    takes Metformni daily  and Lantus 50units bid  . DVT (deep venous thrombosis) (HCC) 2011    legs  . Peripheral neuropathy (HCC)   . Shortness of breath dyspnea   . Stroke [facility])     Past Surgical History  Procedure Laterality Date  . Revasculariztion  2011/2010    x 2   . Tonsillectomy    .  Cardiac catheterization  03/19/10  . Abdominal aortagram  04-30-12  . Adenoidectomy    . Amputation Left 06/03/2012    Procedure: AMPUTATION BELOW KNEE;  Surgeon: [name], MD;  Location: [facility] OR;  Service: Vascular;  Laterality: Left;  . Coronary angioplasty  2002    3 stents  . Endarterectomy femoral Right 03/25/2013    Procedure: ENDARTERECTOMY FEMORAL ;  Surgeon: [name], MD;  Location: [facility] OR;  Service: Vascular;  Laterality: Right;  . Patch angioplasty Right 03/25/2013    Procedure: PATCH ANGIOPLASTY USING VASCU-GUARD PERIPHERAL VASCULAR PATCH;  Surgeon: [name], MD;  Location: [facility] OR;  Service: Vascular;  Laterality: Right;  . Amputation Right 04/14/2013    Procedure: AMPUTATION BELOW KNEE ;  Surgeon: [name], MD;  Location: [facility] OR;  Service: Vascular;  Laterality: Right;  . I&d extremity Right 04/14/2013    Procedure: IRRIGATION AND DEBRIDEMENT OF RIGHT  GROIN & PLACEMENT OF VAC DRESSING;  Surgeon: [name], MD;  Location: [facility] OR;  Service: Vascular;  Laterality: Right;  . Abdominal aortagram N/A 04/30/2012    Procedure: ABDOMINAL Ronny Flurry;  Surgeon: [name], MD;  Location: [facility] CATH LAB;  Service: Cardiovascular;  Laterality: N/A;  . Lower extremity angiogram Bilateral 04/30/2012    Procedure: LOWER EXTREMITY ANGIOGRAM;  Surgeon: [name], MD;  Location: [facility] CATH LAB;  Service: Cardiovascular;  Laterality: Bilateral;  bilat lower extrem angio  . Eye surgery Right     Catarct  . Coronary artery bypass graft  03/2000    quadruple  . Hernia repair      Hiatal Hernia  . Wound debridement Right 04/20/2014    s/p  rt bka  with application of wound vac  . I&d extremity Right 04/20/2014    Procedure: DEBRIDEMENT OF RIGHT BELOW KNEE AMPUTATION STUMP;  Surgeon: [name], MD;  Location: [facility] OR;  Service: Vascular;  Laterality: Right;  . Application of wound vac Right 04/20/2014    Procedure: APPLICATION OF WOUND VAC;  Surgeon: [name], MD;  Location: [facility] OR;  Service: Vascular;  Laterality: Right;    Family History  Problem Relation Age of Onset  . Heart disease Mother     before age 63  . Diabetes Mother   . Heart attack Mother   . Stroke Mother   . Heart disease Father     Social History  Substance Use Topics  . Smoking status: Former Smoker    Types: Cigarettes  . Smokeless tobacco: Never Used     Comment: quit smoking 14yrs ago  . Alcohol Use: No     Comment: occasionally    Prior to Admission medications   Medication Sig Start Date End Date Taking? Authorizing Provider  aspirin EC 81 MG tablet Take 81 mg by mouth daily.   Yes Historical Provider, MD  Canagliflozin-Metformin HCl 50-1000 MG TABS Take 1 tablet by mouth 2 (two) times daily.    Yes Historical Provider, MD   ezetimibe (ZETIA) 10 MG tablet Take 1 tablet (10 mg total) by mouth daily. 05/07/13  Yes Daniel J Angiulli, PA-C  insulin aspart (NOVOLOG) 100 UNIT/ML injection Inject 10 Units into the skin 3 (three) times daily as needed for high blood sugar (BLOOD SUGARS GREATER THAN 200).    Yes Historical Provider, MD  insulin glargine (LANTUS) 100 UNIT/ML injection Inject 50 Units into the skin 2 (two) times daily.   Yes Historical Provider, MD  lisinopril (PRINIVIL,ZESTRIL) 5  MG tablet Take 1 tablet (5 mg total) by mouth daily. 06/21/15  Yes Nilda Riggs, NP  metoprolol succinate (TOPROL-XL) 50 MG 24 hr tablet Take 50 mg by mouth daily. Take with or immediately following a meal.   Yes Historical Provider, MD  nitroGLYCERIN (NITROSTAT) 0.4 MG SL tablet Place 1 tablet (0.4 mg total) under the tongue every 5 (five) minutes as needed for chest pain. 05/07/13  Yes Daniel J Angiulli, PA-C  rivaroxaban (XARELTO) 20 MG TABS tablet Take 1 tablet (20 mg total) by mouth daily at 6 PM. 06/21/15  Yes Nilda Riggs, NP    Current Facility-Administered Medications  Medication Dose Route Frequency Provider Last Rate Last Dose  . 0.9 %  sodium chloride infusion   Intravenous Continuous Hillary Bow, DO 125 mL/hr at 10/23/15 0800    . aspirin EC tablet 81 mg  81 mg Oral Daily Hillary Bow, DO   81 mg at 10/23/15 0911  . ezetimibe (ZETIA) tablet 10 mg  10 mg Oral Daily Hillary Bow, DO   10 mg at 10/23/15 0911  . heparin ADULT infusion 100 units/mL (25000 units/230mL sodium chloride 0.45%)  1,600 Units/hr Intravenous Continuous Leann T Poindexter, RPH 16 mL/hr at 10/23/15 0800 1,600 Units/hr at 10/23/15 0800  . insulin aspart (novoLOG) injection 0-15 Units  0-15 Units Subcutaneous Q4H Hillary Bow, DO   5 Units at 10/23/15 0908  . insulin glargine (LANTUS) injection 25 Units  25 Units Subcutaneous BID Hillary Bow, DO   25 Units at 10/23/15 0347  . metoprolol (LOPRESSOR) injection 5 mg  5 mg  Intravenous Q6H PRN Leroy Sea, MD      . metoprolol succinate (TOPROL-XL) 24 hr tablet 50 mg  50 mg Oral Daily Hillary Bow, DO   50 mg at 10/23/15 0911  . morphine 2 MG/ML injection 2-4 mg  2-4 mg Intravenous Q4H PRN Hillary Bow, DO      . ondansetron [facility]) injection 4 mg  4 mg Intravenous Q6H PRN Hillary Bow, DO      . rosuvastatin (CRESTOR) tablet 20 mg  20 mg Oral q1800 Hillary Bow, DO        Allergies as of 10/22/2015 - Review Complete 10/22/2015  Allergen Reaction Noted  . Statins Other (See Comments) 01/02/2012  . Amlodipine besylate Other (See Comments) 03/28/2010  . Cephalexin Hives 03/28/2010     Review of Systems:    Constitutional: No weight loss, fever or chills HEENT: Eyes: No change in vision               Ears, Nose, Throat:  No change in hearing Skin: No new rash Cardiovascular: No chest pain, chest pressure or palpitations Respiratory: No SOB or cough Gastrointestinal: See HPI and otherwise negative Genitourinary: No dysuria or hematuria Neurological: No headache, dizziness or syncope Musculoskeletal: No new muscle or back pain Hematologic: No bleeding Psychiatric: No history of depression or anxiety   Physical Exam:  Vital signs in last 24 hours: Temp:  [98.6 F (37 C)-98.9 F (37.2 C)] 98.9 F (37.2 C) (07/17 0800) Pulse Rate:  [95-162] 100 (07/17 0800) Resp:  [15-34] 27 (07/17 0800) BP: (90-152)/(34-115) 142/110 mmHg (07/17 0800) SpO2:  [95 %-99 %] 96 % (07/17 0800) Weight:  [282 lb 6.6 oz (128.1 kg)] 282 lb 6.6 oz (128.1 kg) (07/17 0529) Last BM Date: 10/22/15 General:   Pleasant African-American male appears to be in NAD, Well developed, Well nourished,  somewhat lethargic but cooperative Head:  Normocephalic and atraumatic. Eyes:   PEERL, EOMI. No icterus. Conjunctiva pink. Ears:  Normal auditory acuity. Neck:  Supple Throat: Oral cavity and pharynx without inflammation, swelling or lesion.  Lungs: Respirations even and  unlabored. Lungs clear to auscultation bilaterally.   No wheezes, crackles, or rhonchi.  Heart: Normal S1, S2. No MRG. Regular rate and rhythm. No peripheral edema, cyanosis or pallor.  Abdomen:  Soft, mild distention, marked right lower quadrant tenderness with involuntary guarding, Decreased bowel sounds. No appreciable masses or hepatomegaly. Rectal:  Not performed.  Msk:  Symmetrical without gross deformities. Peripheral pulses intact, bilateral BKA Extremities:  Without edema, bilateral BKA Neurologic:  Alert and  oriented x4;  grossly normal neurologically Skin:   Dry and intact without significant lesions or rashes. Psychiatric: Oriented to person, place and time. Demonstrates good judgement and reason without abnormal affect or behaviors.   LAB RESULTS:  Recent Labs  10/22/15 1826  WBC 15.1*  HGB 17.0  HCT 48.2  PLT 184   BMET  Recent Labs  10/22/15 2046 10/23/15 0444  NA 133* 134*  K 4.1 4.8  CL 98* 101  CO2 28 23  GLUCOSE 260* 314*  BUN 12 16  CREATININE 1.26* 1.44*  CALCIUM 8.9 8.3*   LFT No results for input(s): PROT, ALBUMIN, AST, ALT, ALKPHOS, BILITOT, BILIDIR, IBILI in the last 72 hours. PT/INR No results for input(s): LABPROT, INR in the last 72 hours.  STUDIES: Dg Chest 2 View  10/22/2015  CLINICAL DATA:  Shortness of breath. EXAM: CHEST  2 VIEW COMPARISON:  April 16, 2013 FINDINGS: The heart size borderline. No pulmonary nodules, masses, or infiltrates. No pneumothorax. IMPRESSION: No active cardiopulmonary disease. Electronically Signed   By: Gerome Sam III M.D   On: 10/22/2015 20:56   Ct Abdomen Pelvis W Contrast  10/22/2015  CLINICAL DATA:  Right lower quadrant pain. EXAM: CT ABDOMEN AND PELVIS WITH CONTRAST TECHNIQUE: Multidetector CT imaging of the abdomen and pelvis was performed using the standard protocol following bolus administration of intravenous contrast. CONTRAST:  ISOVUE-300 IOPAMIDOL (ISOVUE-300) INJECTION 61% COMPARISON:   None. FINDINGS: Lower chest and abdominal wall: Remote left ventricular infarct with subendocardial scarring along the septum. Hepatobiliary: No focal liver abnormality.No evidence of biliary obstruction or stone. Pancreas: Unremarkable. Spleen: Unremarkable. Adrenals/Urinary Tract: Negative adrenals. Patchy renal cortical scarring, right worse than left. No hydronephrosis or ureteral calculus. Unremarkable bladder. Stomach/Bowel: Dilated ileal loops containing fluid and fecalized contents with mild mesenteric congestion. Transition point the terminal ileum there is borderline wall thickening. No causative mass is seen. Extensive atherosclerosis, without visible or proximal occlusion; affected segments have enhancing walls. No signs of bowel necrosis. Few uncomplicated colonic diverticula. Distal colonic diverticulum. Reproductive:No pathologic findings. Vascular/Lymphatic: Extensive aortic and branch vessel atherosclerosis. Remote vascular surgery in the bilateral groin. Poor or no enhancement at the level of the right left SFA. Vascular evaluation is limited by contrast timing. No acute visceral artery finding. Other: No ascites or pneumoperitoneum. Musculoskeletal: No acute abnormalities. Advanced lower lumbar facet arthropathy. Sacroiliac ankylosis with spurring. IMPRESSION: 1. Partial ileal obstruction without definitive underlying cause. Questionable regional enteritis. Patient has severe atherosclerosis, correlate with lactate. 2. Poor or no enhancement at the bilateral SFA. Remote left ventricular infarct. 3. Bilateral renal cortical scarring. Electronically Signed   By: Marnee Spring M.D.   On: 10/22/2015 23:34     PREVIOUS ENDOSCOPIES:            Colonoscopy: 5-10 yrs ago  [place] in [place] at [facility] clinic- "clean" per pt, no report available EGD: 5-10 yrs ago, pt can't remember where or results   Impression / Plan:  Impression: 1. Right lower quadrant abdominal pain: 3 day history of  increasing right lower quadrant abdominal pain, CT shows a partial ileal obstruction without definitive underlying cause, question of regional enteritis; lactate was minimally increased at 2.4 at time of admission, improved to 1.9 this morning; consider mesenteric ischemia versus enteritis versus ileus versus other 2. Abnormal CT of the abdomen: Showing partial ileal obstruction without definitive underlying cause with questionable regional enteritis 3. Hyponatremia  Plan: 1. Continue supportive measures 2. Agree with nausea and pain control 3. Will discuss above with Dr. Christella Hartigan, please await any further recommendations  Thank you for your kind consultation, we will continue to follow.  Violet Baldy [facility]  10/23/2015, 9:17 AM Pager #: [phone]  ________________________________________________________________________  Corinda Gubler GI MD note:  I personally examined the patient, reviewed the data and agree with the assessment and plan described above.  He has RLQ pain, change in bowels, rather acutely. He's never had problems like this before.  CT shows distal ileum is abnormal (thickened, dilated).  Possibly this is IBD (crohn's) related however he's never had bowel symptoms in past, No FH of BID, symptoms are fairly acute and he tells [place] he had normal Colonoscopy [facility]) about 5 years; these all make IBD less likely.  He is an obvious vasculopath with Afib, CHF, history of blood clots and so I'm also concerned about mesenteric ischemia here. Probably that is more likely than IBD.  Infection is a possibility.  Would be unusual presentation for malignancy.  Colonoscopy MAY help and I'll plan on that for tomorrow.  We're also asking that surgery see the patient to comment on possible mesenteric ischemia.  I'm starting him on empiric IV antibiotics.  xarelto on hold for now, he's covered with IV heparin drip. We'll hold that 6 hours prior to colonoscopy.   Colonoscopy at 10AM, so heparin to be  held at 4am (pharmacy to assist with this, I spoke with them already).     Rob Bunting, MD [facility] Gastroenterology Pager [phone]

## 2015-10-24 NOTE — Progress Notes (Signed)
ANTICOAGULATION CONSULT NOTE - Follow Up  Pharmacy Consult for Heparin Indication: AFib, H/O DVT  Allergies  Allergen Reactions  . Statins Other (See Comments)    Reaction:  Muscle pain   . Amlodipine Besylate Other (See Comments)    Reaction:  Muscle pain   . Cephalexin Hives    Patient Measurements: Height: 5\' 9"  (175.3 cm) Weight: 282 lb 6.6 oz (128.1 kg) IBW/kg (Calculated) : 70.7 Heparin Dosing Weight: 100 kg  Vital Signs: Temp: 99.6 F (37.6 C) (07/18 1116) Temp Source: Oral (07/18 1116) BP: 111/22 mmHg (07/18 1116) Pulse Rate: 93 (07/18 1116)  Labs:  Recent Labs  10/22/15 1826 10/22/15 2046 10/23/15 0444 10/23/15 1110 10/23/15 1830 10/24/15 0100  HGB 17.0  --   --   --   --  15.6  HCT 48.2  --   --   --   --  45.9  PLT 184  --   --   --   --  171  APTT  --   --  28  --   --   --   HEPARINUNFRC  --   --  <0.10* 0.69 0.56  --   CREATININE  --  1.26* 1.44*  --   --  1.67*    Estimated Creatinine Clearance: 56.1 mL/min (by C-G formula based on Cr of 1.67).   Medications:  Scheduled:  . [MAR Hold] aspirin EC  81 mg Oral Daily  . [MAR Hold] ciprofloxacin  400 mg Intravenous BID  . [MAR Hold] ezetimibe  10 mg Oral Daily  . [MAR Hold] hydrALAZINE  25 mg Oral Q8H  . [MAR Hold] insulin aspart  0-15 Units Subcutaneous Q4H  . [MAR Hold] insulin glargine  25 Units Subcutaneous BID  . [MAR Hold] isosorbide mononitrate  15 mg Oral Daily  . [MAR Hold] metoprolol succinate  75 mg Oral Daily  . [MAR Hold] metronidazole  500 mg Intravenous Q8H  . [MAR Hold] rosuvastatin  20 mg Oral q1800   Infusions:     Assessment:  70 yr male with significant PMH including MI, TIA, DM, HTN, AFib, DVT  Patient was on Xarelto 20mg  daily PTA but pt reports has not taken med within the past month due to inability to obtain medication.  Patient presents with abdominal pain, N/V.  CT Abdomen shows partial ileal obstruction and questionable enteritis.  Pharmacy consulted to dose  IV heparin as bridge therapy with plans for colonoscopy 7/18 AM.  Today, 10/24/2015 S/p colonoscopy this AM; spoke with Dr. Christella Hartigan and is ok to resume heparin now without a bolus.  Heparin levels were therapeutic on previous rate of 1600 units/hr and CBC is stable.  Goal of Therapy:  Heparin level 0.3-0.7 units/ml Monitor platelets by anticoagulation protocol: Yes   Plan:   Resume heparin 1600 units/hr  Check heparin level in 8hrs   Daily heparin level and CBC  F/u plans for resuming oral anticoagulation when appropriate  Loralee Pacas, PharmD, BCPS Pager: [phone] 10/24/2015,11:24 AM

## 2015-10-24 NOTE — Anesthesia Preprocedure Evaluation (Addendum)
Anesthesia Evaluation  Patient identified by MRN, date of birth, ID band Patient awake    Reviewed: Allergy & Precautions, NPO status , Patient's Chart, lab work & pertinent test results, reviewed documented beta blocker date and time   History of Anesthesia Complications Negative for: history of anesthetic complications  Airway Mallampati: I  TM Distance: >3 FB Neck ROM: Full    Dental  (+) Edentulous Upper, Edentulous Lower   Pulmonary shortness of breath, former smoker,    breath sounds clear to auscultation       Cardiovascular hypertension, Pt. on medications and Pt. on home beta blockers (-) angina+ CAD, + Past MI, + CABG and + Peripheral Vascular Disease (s/p BKA)  + dysrhythmias Atrial Fibrillation  Rhythm:Irregular Rate:Normal  2/17 ECHO: EF 25-30%, mild pulm HTN '14 Myoview: Large Inferior apical MI, very small area of peri-infarct ischemia toward the inferior apical segment. EF 19%.    Neuro/Psych TIACVA    GI/Hepatic Neg liver ROS, Bowel prep,diarrhea   Endo/Other  diabetes (glu 120), Insulin DependentMorbid obesity  Renal/GU Renal InsufficiencyRenal disease (creat 1.67)     Musculoskeletal   Abdominal (+) + obese,   Peds  Hematology  (+) Blood dyscrasia (xarelto), ,   Anesthesia Other Findings   Reproductive/Obstetrics                            Anesthesia Physical Anesthesia Plan  ASA: IV  Anesthesia Plan: MAC   Post-op Pain Management:    Induction: Intravenous  Airway Management Planned: Natural Airway and Nasal Cannula  Additional Equipment:   Intra-op Plan:   Post-operative Plan:   Informed Consent: I have reviewed the patients History and Physical, chart, labs and discussed the procedure including the risks, benefits and alternatives for the proposed anesthesia with the patient or authorized representative who has indicated his/her understanding and  acceptance.     Plan Discussed with: CRNA and Surgeon  Anesthesia Plan Comments: (Plan routine monitors, MAC)        Anesthesia Quick Evaluation

## 2015-10-24 NOTE — Op Note (Signed)
[facility] Patient Name: Leroy [name] Procedure Date: 10/24/2015 MRN: [id number] Attending MD: Rachael Fee , MD Date of Birth: July 15, 1945 CSN: [id number] Age: 70 Admit Type: Inpatient Procedure:                Colonoscopy Indications:              Abdominal pain in the right lower quadrant,                            abnormal distal ileum on CT; signficant vasculopath                            with elevated risk for ischemic disease Providers:                Rachael Fee, MD, Janae Sauce. Steele Berg, RN, Arlee Muslim Tech., Technician, Doreene Burke, CRNA Referring MD:              Medicines:                Monitored Anesthesia Care Complications:            No immediate complications. Estimated blood loss:                            None. Estimated Blood Loss:     Estimated blood loss: none. Procedure:                Pre-Anesthesia Assessment:                           - Prior to the procedure, a History and Physical                            was performed, and patient medications and                            allergies were reviewed. The patient's tolerance of                            previous anesthesia was also reviewed. The risks                            and benefits of the procedure and the sedation                            options and risks were discussed with the patient.                            All questions were answered, and informed consent                            was obtained. Prior Anticoagulants: The patient has  taken heparin, last dose was day of procedure. ASA                            Grade Assessment: IV - A patient with severe                            systemic disease that is a constant threat to life.                            After reviewing the risks and benefits, the patient                            was deemed in satisfactory condition to undergo the             procedure.                           After obtaining informed consent, the colonoscope                            was passed under direct vision. Throughout the                            procedure, the patient's blood pressure, pulse, and                            oxygen saturations were monitored continuously. The                            EC-[id number] ([id number]) scope was introduced through                            the anus and advanced to the the terminal ileum.                            The colonoscopy was performed without difficulty.                            The patient tolerated the procedure well. The                            quality of the bowel preparation was adequate. The                            terminal ileum, ileocecal valve, appendiceal                            orifice, and rectum were photographed. Scope In: 10:46:26 AM Scope Out: 11:05:08 AM Scope Withdrawal Time: 0 hours 14 minutes 45 seconds  Total Procedure Duration: 0 hours 18 minutes 42 seconds  Findings:      Diffuse inflammation, circumferential and severe and characterized by       erythema, friability and shallow ulcerations was found in the distal       ileum (TI intubated for about  10cm). Biopsies were taken with a cold       forceps for histology. No signs of necrossis.      Diffuse severe , cirumferential inflammation characterized by erythema,       friability and shallow ulcerations was found in the cecum. Biopsies were       taken with a cold forceps for histology. No signs of necrosis.      Two small sessile polyps were found in transverse segment, these were       not removed given his overall clinical situation. Impression:               Severe, circumferential, ulcerated inflammation in                            distal ileum and cecum. The bowel did not appear                            necrotic. Biospies were taken from ileum and cecum.                            Overall more  consistent with ischemic disease than                            IBD. Moderate Sedation:      N/A- Per Anesthesia Care Recommendation:           - Return patient to hospital ward for ongoing care.                           - Clear liquid diet.                           - Continue broad sprectum IV abx for now,                            supportive care.                           - Await pathology results. Procedure Code(s):        --- Professional ---                           [phone], Colonoscopy, flexible; with biopsy, single                            or multiple Diagnosis Code(s):        --- Professional ---                           K55.9, Vascular disorder of intestine, unspecified                           K52.9, Noninfective gastroenteritis and colitis,                            unspecified  R10.31, Right lower quadrant pain CPT copyright 2016 [employer]. All rights reserved. The codes documented in this report are preliminary and upon coder review may  be revised to meet current compliance requirements. Rachael Fee, MD 10/24/2015 11:19:36 AM This report has been signed electronically. Number of Addenda: 0

## 2015-10-24 NOTE — Progress Notes (Signed)
Notified charge nurse of pt with SVT 160's. Originally able to return to ST with a cough, returned to SVT quickly.  Pt had adenosine last night to convert. Told to notify elink, Dr Kendrick Fries ordered EKG and lopressor 5 iv, pt converted to ST 110's.will continue to monitor. Barnett Hatter P

## 2015-10-24 NOTE — Progress Notes (Signed)
ANTICOAGULATION CONSULT NOTE - Follow Up  Pharmacy Consult for Heparin Indication: AFib, H/O DVT  Allergies  Allergen Reactions  . Statins Other (See Comments)    Reaction:  Muscle pain   . Amlodipine Besylate Other (See Comments)    Reaction:  Muscle pain   . Cephalexin Hives    Patient Measurements: Height: 5\' 9"  (175.3 cm) Weight: 282 lb 6.6 oz (128.1 kg) IBW/kg (Calculated) : 70.7 Heparin Dosing Weight: 100 kg  Vital Signs: Temp: 97.4 F (36.3 C) (07/18 1310) Temp Source: Axillary (07/18 1310) BP: 108/67 mmHg (07/18 1400) Pulse Rate: 93 (07/18 1400)  Labs:  Recent Labs  10/22/15 1826 10/22/15 2046 10/23/15 0444 10/23/15 1110 10/23/15 1830 10/24/15 0100  HGB 17.0  --   --   --   --  15.6  HCT 48.2  --   --   --   --  45.9  PLT 184  --   --   --   --  171  APTT  --   --  28  --   --   --   HEPARINUNFRC  --   --  <0.10* 0.69 0.56  --   CREATININE  --  1.26* 1.44*  --   --  1.67*    Estimated Creatinine Clearance: 56.1 mL/min (by C-G formula based on Cr of 1.67).   Medications:  Scheduled:  . aspirin EC  81 mg Oral Daily  . ciprofloxacin  400 mg Intravenous BID  . ezetimibe  10 mg Oral Daily  . hydrALAZINE  25 mg Oral Q8H  . insulin aspart  0-15 Units Subcutaneous Q4H  . insulin glargine  25 Units Subcutaneous BID  . isosorbide mononitrate  15 mg Oral Daily  . metoprolol succinate  75 mg Oral Daily  . metronidazole  500 mg Intravenous Q8H  . rosuvastatin  20 mg Oral q1800   Infusions:  . heparin 1,600 Units/hr (10/24/15 1258)    Assessment:  70 yr male with significant PMH including MI, TIA, DM, HTN, AFib, DVT  Patient was on Xarelto 20mg  daily PTA but pt reports has not taken med within the past month due to inability to obtain medication.  Patient presents with abdominal pain, N/V.  CT Abdomen shows partial ileal obstruction and questionable enteritis.  Pharmacy consulted to dose IV heparin as bridge therapy with plans for colonoscopy 7/18  AM.  Today, 10/24/2015 S/p colonoscopy this AM; spoke with Dr. Christella Hartigan and is ok to resume heparin now at previous rate without a bolus.  Heparin level therapeutic after restart  No reported bleeding  Goal of Therapy:  Heparin level 0.3-0.7 units/ml Monitor platelets by anticoagulation protocol: Yes   Plan:   Continue heparin level at 1600 units/hr  Daily heparin level and CBC  F/u plans for resuming oral anticoagulation when appropriate  Bernadene Person, PharmD, BCPS Pager: [phone] 10/24/2015, 3:09 PM

## 2015-10-24 NOTE — Progress Notes (Signed)
eLink Physician-Brief Progress Note Patient Name: Leroy [name] DOB: 12-08-1945 MRN: [id number]   Date of Service  10/24/2015  HPI/Events of Note  SVT, HD stable, converted to sinus rhythm with cough  eICU Interventions  Monitor Will discuss with primary team, may need repeat electrolytes, etc     Intervention Category Intermediate Interventions: Arrhythmia - evaluation and management  Max Fickle 10/24/2015, 12:28 AM

## 2015-10-24 NOTE — Progress Notes (Signed)
Subjective: He continues to have some pain primarily in the right lower quadrant. He has minimal tenderness right upper quadrant. No pain on the left. No further nausea or vomiting. He continues to have some loose stools.  Objective: Vital signs in last 24 hours: Temp:  [98.4 F (36.9 C)-99.9 F (37.7 C)] 98.9 F (37.2 C) (07/18 0808) Pulse Rate:  [82-108] 96 (07/18 0640) Resp:  [17-35] 28 (07/18 0640) BP: (93-147)/(13-75) 130/41 mmHg (07/18 0640) SpO2:  [93 %-98 %] 95 % (07/18 0640) Last BM Date: 10/23/15 1600 PO recorded  1600 IV Urine 401 recorded  Stool x 4 recorded TM 99.9 BP down some last PM WBC 16.4 small trend up/  H/H is better with hydration  Creatinine is up  Intake/Output from previous day: 07/17 0701 - 07/18 0700 In: 3911 [P.O.:1600; I.V.:1611; IV Piggyback:700] Out: 405 [Urine:401; Stool:4] Intake/Output this shift:    General appearance: alert, cooperative and no distress GI: soft, still points to right lower quadrant source of his discomfort. Few bowel sounds. He is not distended, no peritonitis.  Lab Results:   Recent Labs  10/22/15 1826 10/24/15 0100  WBC 15.1* 16.4*  HGB 17.0 15.6  HCT 48.2 45.9  PLT 184 171    BMET  Recent Labs  10/23/15 0444 10/24/15 0100  NA 134* 136  K 4.8 4.0  CL 101 102  CO2 23 23  GLUCOSE 314* 146*  BUN 16 22*  CREATININE 1.44* 1.67*  CALCIUM 8.3* 8.4*   PT/INR No results for input(s): LABPROT, INR in the last 72 hours.  No results for input(s): AST, ALT, ALKPHOS, BILITOT, PROT, ALBUMIN in the last 168 hours.   Lipase     Component Value Date/Time   LIPASE 14 10/22/2015 2046     Studies/Results: Dg Chest 2 View  10/22/2015  CLINICAL DATA:  Shortness of breath. EXAM: CHEST  2 VIEW COMPARISON:  April 16, 2013 FINDINGS: The heart size borderline. No pulmonary nodules, masses, or infiltrates. No pneumothorax. IMPRESSION: No active cardiopulmonary disease. Electronically Signed   By: Gerome Sam  III M.D   On: 10/22/2015 20:56   Ct Abdomen Pelvis W Contrast  10/22/2015  CLINICAL DATA:  Right lower quadrant pain. EXAM: CT ABDOMEN AND PELVIS WITH CONTRAST TECHNIQUE: Multidetector CT imaging of the abdomen and pelvis was performed using the standard protocol following bolus administration of intravenous contrast. CONTRAST:  ISOVUE-300 IOPAMIDOL (ISOVUE-300) INJECTION 61% COMPARISON:  None. FINDINGS: Lower chest and abdominal wall: Remote left ventricular infarct with subendocardial scarring along the septum. Hepatobiliary: No focal liver abnormality.No evidence of biliary obstruction or stone. Pancreas: Unremarkable. Spleen: Unremarkable. Adrenals/Urinary Tract: Negative adrenals. Patchy renal cortical scarring, right worse than left. No hydronephrosis or ureteral calculus. Unremarkable bladder. Stomach/Bowel: Dilated ileal loops containing fluid and fecalized contents with mild mesenteric congestion. Transition point the terminal ileum there is borderline wall thickening. No causative mass is seen. Extensive atherosclerosis, without visible or proximal occlusion; affected segments have enhancing walls. No signs of bowel necrosis. Few uncomplicated colonic diverticula. Distal colonic diverticulum. Reproductive:No pathologic findings. Vascular/Lymphatic: Extensive aortic and branch vessel atherosclerosis. Remote vascular surgery in the bilateral groin. Poor or no enhancement at the level of the right left SFA. Vascular evaluation is limited by contrast timing. No acute visceral artery finding. Other: No ascites or pneumoperitoneum. Musculoskeletal: No acute abnormalities. Advanced lower lumbar facet arthropathy. Sacroiliac ankylosis with spurring. IMPRESSION: 1. Partial ileal obstruction without definitive underlying cause. Questionable regional enteritis. Patient has severe atherosclerosis, correlate with lactate. 2. Poor  or no enhancement at the bilateral SFA. Remote left ventricular infarct. 3.  Bilateral renal cortical scarring. Electronically Signed   By: Marnee Spring M.D.   On: 10/22/2015 23:34   Dg Abd 2 Views  10/23/2015  CLINICAL DATA:  Generalized abdominal pain. EXAM: ABDOMEN - 2 VIEW COMPARISON:  CT scan of October 22, 2015. FINDINGS: The bowel gas pattern is normal. There is no evidence of free air. No radio-opaque calculi or other significant radiographic abnormality is seen. IMPRESSION: No evidence of bowel obstruction or ileus. Electronically Signed   By: Lupita Raider, M.D.   On: 10/23/2015 15:03    Medications: . aspirin EC  81 mg Oral Daily  . ciprofloxacin  400 mg Intravenous BID  . ezetimibe  10 mg Oral Daily  . hydrALAZINE  50 mg Oral Q8H  . insulin aspart  0-15 Units Subcutaneous Q4H  . insulin glargine  25 Units Subcutaneous BID  . isosorbide mononitrate  30 mg Oral Daily  . lisinopril  5 mg Oral Daily  . metoprolol succinate  75 mg Oral Daily  . metronidazole  500 mg Intravenous Q8H  . rosuvastatin  20 mg Oral q1800   . 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 Cardiomyopathy with last EF of approximately 25 -30% 05/26/15 History of chronic systolic congestive heart failure. Onset diabetes mellitus insulin-dependent with poor control Prior abdominal hernia repair. Body mass index is 41.69    Assessment/Plan Abdominal pain rule out mesenteric ischemia. Possible enteritis History of atrial fibrillation/SVT last PM History of CVA and recent TIA/off Xarelto for at least one month FEN:NPO IV fluids ID: day 2 Cipro/Flagyl DVT:  Currently none- cannot do SCD   Plan: Patient scheduled for colonoscopy today. We will continue to follow with you.    LOS: 1 day    Britne Borelli 10/24/2015 [phone]

## 2015-10-24 NOTE — Progress Notes (Signed)
PROGRESS NOTE                                                                                                                                                                                                             Patient Demographics:    Leroy [name], is a 70 y.o. male, DOB - 1945-11-05, ZOX:[id number]  Admit date - 10/22/2015   Admitting Physician Hillary Bow, DO  Outpatient Primary MD for the patient is [name], MD  LOS - 1  Chief Complaint  Patient presents with  . Abdominal Pain  . Emesis       Brief Narrative    Leroy [name] is a 70 y.o. male with medical history significant of atherosclerotic disease of multiple sites (PAD, MI, TIA, etc), DM2 on insulin, HTN. Patient presents to the ED with c/o abdominal pain, N/V/D. Pain is located in his RLQ, onset on Friday. Has been worsening since that time. Anything he eats gets vomited back up he says and pain becomes worse. Can keep down water though.  ED Course: CT abd / pelvis shows regional enteritis and is worrisome for mesenteric ischemia, especially given his extensive vascular disease. Lactate is slightly elevated initially at 2.4.   Subjective:    Leroy [name] today has, No headache, No chest pain, Mild right lower quadrant abdominal pain - No Nausea, No new weakness tingling or numbness, No Cough - SOB.     Assessment  & Plan :     1.Right lower quadrant abdominal pain with CT scan suspicious for terminal ileum inflammation. In the light of patient's history of severe arterial disease with bilateral BKA, CAD, TIA also paroxysmal atrial fibrillation/atrial flutter. Suspicion for ischemic bowel is high, he is currently on bowel rest with clear liquid diet and heparin drip. Also being covered with IV Cipro and Flagyl as infection cannot be completely ruled out, GI and general surgery both on board and it is planned for colonoscopy on 10/24/2015.  Continue hydration for now with caution.  2. Paroxysmal atrial fibrillation/atrial flutter. Also history of paroxysmal supraventricular tachycardia. Continue beta blocker, xaralto held and transitioned to heparin drip for now, continue aspirin. Telemetry monitor.  3. Essential hypertension. For now on beta blocker will add hydralazine scheduled and as needed.  Hold ACE inhibitor due to ARF.   4. 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 Currently he is compensated, on beta blocker, Hold ACE due to renal failure, have added hydralazine and Imdur, on IV fluids as NPO and monitor closely.  5. CAD. Currently chest pain-free, on aspirin and beta blocker long with Zetia for secondary prevention.  6. PAD with bilateral BKA. Supportive care.  7. ARF - due GI prep and mild dehydration, hold ACE, hydrate, Renal US, repeat BMP in am.  8. DM type II. On half home dose Lantus along with sliding scale will monitor.  Lab Results  Component Value Date   HGBA1C 12.0* 05/23/2015   CBG (last 3)   Recent Labs  10/23/15 2331 10/24/15 0343 10/24/15 0738  GLUCAP 136* 143* 120*       Family Communication  :  None present  Code Status : Full  Diet :  Clear liquid  Disposition Plan  :  Stay in stepdown  Consults  :  GI, general surgery  Procedures  :    Colonoscopy due  Renal US due  CT scan abdomen and pelvis with terminal ileum inflammation  TTE Feb 2017  Left ventricle: The cavity size was normal. Systolic function was severely reduced. The estimated ejection fraction was in the range of 25% to 30%. Diffuse hypokinesis. There was an increased relative contribution of atrial contraction to ventricular  filling. Doppler parameters are consistent with abnormal left ventricular relaxation (grade 1 diastolic dysfunction). - Aortic valve: Trileaflet; mildly thickened, mildly calcified leaflets. - Mitral valve: Calcified annulus. - Pulmonary  arteries: PA peak pressure: 39 mm Hg (S).  Impressions:  The right ventricular systolic pressure was increased consistent with mild pulmonary hypertension.    DVT Prophylaxis  :   Heparin  gtt  Lab Results  Component Value Date   PLT 171 10/24/2015    Inpatient Medications  Scheduled Meds: . aspirin EC  81 mg Oral Daily  . ciprofloxacin  400 mg Intravenous BID  . ezetimibe  10 mg Oral Daily  . hydrALAZINE  50 mg Oral Q8H  . insulin aspart  0-15 Units Subcutaneous Q4H  . insulin glargine  25 Units Subcutaneous BID  . isosorbide mononitrate  30 mg Oral Daily  . metoprolol succinate  75 mg Oral Daily  . metronidazole  500 mg Intravenous Q8H  . rosuvastatin  20 mg Oral q1800  . sodium chloride  500 mL Intravenous Once   Continuous Infusions: . sodium chloride     PRN Meds:.hydrALAZINE, metoprolol, morphine injection, ondansetron (ZOFRAN) IV  Antibiotics  :    Anti-infectives    Start     Dose/Rate Route Frequency Ordered Stop   10/23/15 1200  metroNIDAZOLE (FLAGYL) IVPB 500 mg     500 mg 100 mL/hr over 60 Minutes Intravenous Every 8 hours 10/23/15 1034     10/23/15 1100  ciprofloxacin (CIPRO) IVPB 400 mg     400 mg 200 mL/hr over 60 Minutes Intravenous 2 times daily 10/23/15 1034           Objective:   Filed Vitals:   10/24/15 0700 10/24/15 0800 10/24/15 0808 10/24/15 0900  BP:  105/40    Pulse: 92 92  97  Temp:   98.9 F (37.2 C)   TempSrc:   Axillary   Resp: 27 25  35  Height:      Weight:      SpO2: 96% 97%  95%    Wt Readings from Last 3 Encounters:  10/23/15 128.1 kg (282  lb 6.6 oz)  06/21/15 135.626 kg (299 lb)  05/22/15 135.081 kg (297 lb 12.8 oz)     Intake/Output Summary (Last 24 hours) at 10/24/15 0913 Last data filed at 10/24/15 0600  Gross per 24 hour  Intake   3629 ml  Output    405 ml  Net   3224 ml     Physical Exam  Awake Alert, Oriented X 3, No new F.N deficits, Normal affect .AT,PERRAL Supple Neck,No JVD, No cervical  lymphadenopathy appriciated.  Symmetrical Chest wall movement, Good air movement bilaterally, CTAB RRR,No Gallops,Rubs or new Murmurs, No Parasternal Heave +ve B.Sounds, Abd Soft, +ve RLQ tenderness, No organomegaly appriciated, No rebound - guarding or rigidity. No Cyanosis, Clubbing or edema, No new Rash or bruise , bilateral BKA     Data Review:    CBC  Recent Labs Lab 10/22/15 1826 10/24/15 0100  WBC 15.1* 16.4*  HGB 17.0 15.6  HCT 48.2 45.9  PLT 184 171  MCV 87.3 90.2  MCH 30.8 30.6  MCHC 35.3 34.0  RDW 13.1 13.2    Chemistries   Recent Labs Lab 10/22/15 2046 10/23/15 0444 10/24/15 0100 10/24/15 0101  NA 133* 134* 136  --   K 4.1 4.8 4.0  --   CL 98* 101 102  --   CO2 28 23 23   --   GLUCOSE 260* 314* 146*  --   BUN 12 16 22*  --   CREATININE 1.26* 1.44* 1.67*  --   CALCIUM 8.9 8.3* 8.4*  --   MG  --   --   --  1.9   ------------------------------------------------------------------------------------------------------------------ No results for input(s): CHOL, HDL, LDLCALC, TRIG, CHOLHDL, LDLDIRECT in the last 72 hours.  Lab Results  Component Value Date   HGBA1C 12.0* 05/23/2015   Lab Results  Component Value Date   INR 1.01 05/22/2015   INR 1.17 04/12/2013   INR 0.97 03/23/2013    ------------------------------------------------------------------------------------------------------------------ No results for input(s): TSH, T4TOTAL, T3FREE, THYROIDAB in the last 72 hours.  Invalid input(s): FREET3 ------------------------------------------------------------------------------------------------------------------ No results for input(s): VITAMINB12, FOLATE, FERRITIN, TIBC, IRON, RETICCTPCT in the last 72 hours.  Coagulation profile No results for input(s): INR, PROTIME in the last 168 hours.  No results for input(s): DDIMER in the last 72 hours.  Cardiac Enzymes No results for input(s): CKMB, TROPONINI, MYOGLOBIN in the last 168  hours.  Invalid input(s): CK ------------------------------------------------------------------------------------------------------------------    Component Value Date/Time   BNP 147.3* 10/22/2015 2119    Micro Results Recent Results (from the past 240 hour(s))  MRSA PCR Screening     Status: None   Collection Time: 10/23/15  5:38 AM  Result Value Ref Range Status   MRSA by PCR NEGATIVE NEGATIVE Final    Comment:        The GeneXpert MRSA Assay (FDA approved for NASAL specimens only), is one component of a comprehensive MRSA colonization surveillance program. It is not intended to diagnose MRSA infection nor to guide or monitor treatment for MRSA infections.     Radiology Reports Dg Chest 2 View  10/22/2015  CLINICAL DATA:  Shortness of breath. EXAM: CHEST  2 VIEW COMPARISON:  April 16, 2013 FINDINGS: The heart size borderline. No pulmonary nodules, masses, or infiltrates. No pneumothorax. IMPRESSION: No active cardiopulmonary disease. Electronically Signed   By: Gerome Sam III M.D   On: 10/22/2015 20:56   Ct Abdomen Pelvis W Contrast  10/22/2015  CLINICAL DATA:  Right lower quadrant pain. EXAM: CT ABDOMEN AND PELVIS  WITH CONTRAST TECHNIQUE: Multidetector CT imaging of the abdomen and pelvis was performed using the standard protocol following bolus administration of intravenous contrast. CONTRAST:  ISOVUE-300 IOPAMIDOL (ISOVUE-300) INJECTION 61% COMPARISON:  None. FINDINGS: Lower chest and abdominal wall: Remote left ventricular infarct with subendocardial scarring along the septum. Hepatobiliary: No focal liver abnormality.No evidence of biliary obstruction or stone. Pancreas: Unremarkable. Spleen: Unremarkable. Adrenals/Urinary Tract: Negative adrenals. Patchy renal cortical scarring, right worse than left. No hydronephrosis or ureteral calculus. Unremarkable bladder. Stomach/Bowel: Dilated ileal loops containing fluid and fecalized contents with mild mesenteric  congestion. Transition point the terminal ileum there is borderline wall thickening. No causative mass is seen. Extensive atherosclerosis, without visible or proximal occlusion; affected segments have enhancing walls. No signs of bowel necrosis. Few uncomplicated colonic diverticula. Distal colonic diverticulum. Reproductive:No pathologic findings. Vascular/Lymphatic: Extensive aortic and branch vessel atherosclerosis. Remote vascular surgery in the bilateral groin. Poor or no enhancement at the level of the right left SFA. Vascular evaluation is limited by contrast timing. No acute visceral artery finding. Other: No ascites or pneumoperitoneum. Musculoskeletal: No acute abnormalities. Advanced lower lumbar facet arthropathy. Sacroiliac ankylosis with spurring. IMPRESSION: 1. Partial ileal obstruction without definitive underlying cause. Questionable regional enteritis. Patient has severe atherosclerosis, correlate with lactate. 2. Poor or no enhancement at the bilateral SFA. Remote left ventricular infarct. 3. Bilateral renal cortical scarring. Electronically Signed   By: Marnee Spring M.D.   On: 10/22/2015 23:34   Dg Abd 2 Views  10/23/2015  CLINICAL DATA:  Generalized abdominal pain. EXAM: ABDOMEN - 2 VIEW COMPARISON:  CT scan of October 22, 2015. FINDINGS: The bowel gas pattern is normal. There is no evidence of free air. No radio-opaque calculi or other significant radiographic abnormality is seen. IMPRESSION: No evidence of bowel obstruction or ileus. Electronically Signed   By: Lupita Raider, M.D.   On: 10/23/2015 15:03    Time Spent in minutes  30   SINGH,PRASHANT K M.D on 10/24/2015 at 9:13 AM  Between 7am to 7pm - Pager - [phone]  After 7pm go to [email or web address] - password [email or web address]  [facility] -  Office  [phone]

## 2015-10-24 NOTE — Interval H&P Note (Signed)
History and Physical Interval Note:  10/24/2015 10:30 AM  Leroy [name]  has presented today for surgery, with the diagnosis of RLQ pain, diarrhea, abnormal CT scan  The various methods of treatment have been discussed with the patient and family. After consideration of risks, benefits and other options for treatment, the patient has consented to  Procedure(s): COLONOSCOPY WITH PROPOFOL (N/A) as a surgical intervention .  The patient's history has been reviewed, patient examined, no change in status, stable for surgery.  I have reviewed the patient's chart and labs.  Questions were answered to the patient's satisfaction.     Rachael Fee

## 2015-10-24 NOTE — Progress Notes (Signed)
eLink Physician-Brief Progress Note Patient Name: Leroy [name] DOB: 05-11-1945 MRN: [id number]   Date of Service  10/24/2015  HPI/Events of Note  svt  eICU Interventions  12 lead Metoprolol RN to notify primary service     Intervention Category Intermediate Interventions: Arrhythmia - evaluation and management  Max Fickle 10/24/2015, 12:37 AM

## 2015-10-24 NOTE — Transfer of Care (Signed)
Immediate Anesthesia Transfer of Care Note  Patient: Leroy [name]  Procedure(s) Performed: Procedure(s): COLONOSCOPY WITH PROPOFOL (N/A)  Patient Location: PACU  Anesthesia Type:MAC  Level of Consciousness:  sedated, patient cooperative and responds to stimulation  Airway & Oxygen Therapy:Patient Spontanous Breathing and Patient connected to face mask oxgen  Post-op Assessment:  Report given to PACU RN and Post -op Vital signs reviewed and stable  Post vital signs:  Reviewed and stable  Last Vitals:  Filed Vitals:   10/24/15 1000 10/24/15 1003  BP: 107/51 91/69  Pulse: 94 95  Temp:  37.5 C  Resp: 23 23    Complications: No apparent anesthesia complications

## 2015-10-24 NOTE — Anesthesia Postprocedure Evaluation (Signed)
Anesthesia Post Note  Patient: Leroy [name]  Procedure(s) Performed: Procedure(s) (LRB): COLONOSCOPY WITH PROPOFOL (N/A)  Patient location during evaluation: Endoscopy Anesthesia Type: MAC Level of consciousness: awake and alert, oriented and patient cooperative Pain management: pain level controlled Vital Signs Assessment: post-procedure vital signs reviewed and stable Respiratory status: nonlabored ventilation, respiratory function stable, spontaneous breathing and patient connected to nasal cannula oxygen Cardiovascular status: blood pressure returned to baseline and stable Postop Assessment: no signs of nausea or vomiting Anesthetic complications: no    Last Vitals:  Filed Vitals:   10/24/15 1116 10/24/15 1310  BP: 111/22   Pulse: 93   Temp: 37.6 C 36.3 C  Resp: 30     Last Pain:  Filed Vitals:   10/24/15 1310  PainSc: 0-No pain                 Geovany Trudo,E. Kiosha Buchan

## 2015-10-25 ENCOUNTER — Encounter (HOSPITAL_COMMUNITY): Payer: Self-pay | Admitting: Gastroenterology

## 2015-10-25 ENCOUNTER — Inpatient Hospital Stay (HOSPITAL_COMMUNITY): Payer: Commercial Managed Care - HMO

## 2015-10-25 DIAGNOSIS — K559 Vascular disorder of intestine, unspecified: Secondary | ICD-10-CM

## 2015-10-25 DIAGNOSIS — E1151 Type 2 diabetes mellitus with diabetic peripheral angiopathy without gangrene: Secondary | ICD-10-CM

## 2015-10-25 DIAGNOSIS — R112 Nausea with vomiting, unspecified: Secondary | ICD-10-CM

## 2015-10-25 DIAGNOSIS — I482 Chronic atrial fibrillation: Secondary | ICD-10-CM

## 2015-10-25 DIAGNOSIS — I480 Paroxysmal atrial fibrillation: Secondary | ICD-10-CM

## 2015-10-25 LAB — BASIC METABOLIC PANEL
ANION GAP: 7 (ref 5–15)
BUN: 18 mg/dL (ref 6–20)
CO2: 22 mmol/L (ref 22–32)
Calcium: 7.5 mg/dL — ABNORMAL LOW (ref 8.9–10.3)
Chloride: 106 mmol/L (ref 101–111)
Creatinine, Ser: 1.24 mg/dL (ref 0.61–1.24)
GFR, EST NON AFRICAN AMERICAN: 58 mL/min — AB (ref >60)
GLUCOSE: 95 mg/dL (ref 65–99)
POTASSIUM: 3.2 mmol/L — AB (ref 3.5–5.1)
Sodium: 135 mmol/L (ref 135–145)

## 2015-10-25 LAB — GLUCOSE, CAPILLARY
GLUCOSE-CAPILLARY: 182 mg/dL — AB (ref 65–99)
GLUCOSE-CAPILLARY: 76 mg/dL (ref 65–99)
Glucose-Capillary: 126 mg/dL — ABNORMAL HIGH (ref 65–99)
Glucose-Capillary: 118 mg/dL — ABNORMAL HIGH (ref 65–99)
Glucose-Capillary: 178 mg/dL — ABNORMAL HIGH (ref 65–99)

## 2015-10-25 LAB — HEPARIN LEVEL (UNFRACTIONATED): Heparin Unfractionated: 0.37 IU/mL (ref 0.30–0.70)

## 2015-10-25 LAB — CBC
HEMATOCRIT: 38.9 % — AB (ref 39.0–52.0)
Hemoglobin: 13.3 g/dL (ref 13.0–17.0)
MCH: 30.4 pg (ref 26.0–34.0)
MCHC: 34.2 g/dL (ref 30.0–36.0)
MCV: 88.8 fL (ref 78.0–100.0)
PLATELETS: 184 10*3/uL (ref 150–400)
RBC: 4.38 MIL/uL (ref 4.22–5.81)
RDW: 13.1 % (ref 11.5–15.5)
WBC: 16.2 10*3/uL — AB (ref 4.0–10.5)

## 2015-10-25 MED ORDER — INSULIN ASPART 100 UNIT/ML ~~LOC~~ SOLN
3.0000 [IU] | Freq: Three times a day (TID) | SUBCUTANEOUS | Status: DC
  Administered 2015-10-25 – 2015-10-29 (×11): 3 [IU] via SUBCUTANEOUS

## 2015-10-25 MED ORDER — POTASSIUM CHLORIDE CRYS ER 20 MEQ PO TBCR
40.0000 meq | EXTENDED_RELEASE_TABLET | Freq: Two times a day (BID) | ORAL | Status: AC
  Administered 2015-10-25 (×2): 40 meq via ORAL
  Filled 2015-10-25 (×2): qty 2

## 2015-10-25 MED ORDER — INSULIN ASPART 100 UNIT/ML ~~LOC~~ SOLN
0.0000 [IU] | Freq: Every day | SUBCUTANEOUS | Status: DC
  Administered 2015-10-25 – 2015-10-26 (×2): 0 [IU] via SUBCUTANEOUS
  Administered 2015-10-27: 2 [IU] via SUBCUTANEOUS

## 2015-10-25 MED ORDER — INSULIN ASPART 100 UNIT/ML ~~LOC~~ SOLN
0.0000 [IU] | Freq: Three times a day (TID) | SUBCUTANEOUS | Status: DC
  Administered 2015-10-25: 2 [IU] via SUBCUTANEOUS
  Administered 2015-10-26: 5 [IU] via SUBCUTANEOUS
  Administered 2015-10-26 – 2015-10-27 (×3): 3 [IU] via SUBCUTANEOUS
  Administered 2015-10-27: 8 [IU] via SUBCUTANEOUS
  Administered 2015-10-27 – 2015-10-28 (×2): 5 [IU] via SUBCUTANEOUS
  Administered 2015-10-28: 3 [IU] via SUBCUTANEOUS
  Administered 2015-10-28: 5 [IU] via SUBCUTANEOUS
  Administered 2015-10-29: 3 [IU] via SUBCUTANEOUS

## 2015-10-25 MED ORDER — LISINOPRIL 10 MG PO TABS
5.0000 mg | ORAL_TABLET | Freq: Every day | ORAL | Status: DC
  Administered 2015-10-26 – 2015-10-29 (×4): 5 mg via ORAL
  Filled 2015-10-25 (×4): qty 1

## 2015-10-25 NOTE — Progress Notes (Signed)
ANTICOAGULATION CONSULT NOTE - Follow Up  Pharmacy Consult for Heparin (Xarelto on hold) Indication: AFib, H/O DVT  Allergies  Allergen Reactions  . Statins Other (See Comments)    Reaction:  Muscle pain   . Amlodipine Besylate Other (See Comments)    Reaction:  Muscle pain   . Cephalexin Hives    Patient Measurements: Height: 5\' 9"  (175.3 cm) Weight: 287 lb 14.7 oz (130.6 kg) IBW/kg (Calculated) : 70.7 Heparin Dosing Weight: 100 kg  Vital Signs: Temp: 98.5 F (36.9 C) (07/19 0400) Temp Source: Oral (07/19 0400) BP: 127/45 mmHg (07/19 0619) Pulse Rate: 81 (07/19 0600)  Labs:  Recent Labs  10/22/15 1826  10/23/15 0444  10/23/15 1830 10/24/15 0100 10/24/15 2024 10/25/15 0332  HGB 17.0  --   --   --   --  15.6  --  13.3  HCT 48.2  --   --   --   --  45.9  --  38.9*  PLT 184  --   --   --   --  171  --  184  APTT  --   --  28  --   --   --   --   --   HEPARINUNFRC  --   --  <0.10*  < > 0.56  --  0.37 0.37  CREATININE  --   < > 1.44*  --   --  1.67*  --  1.24  < > = values in this interval not displayed.  Estimated Creatinine Clearance: 76.4 mL/min (by C-G formula based on Cr of 1.24).   Medications:  Scheduled:  . aspirin EC  81 mg Oral Daily  . ciprofloxacin  400 mg Intravenous BID  . ezetimibe  10 mg Oral Daily  . hydrALAZINE  25 mg Oral Q8H  . insulin aspart  0-15 Units Subcutaneous Q4H  . insulin glargine  25 Units Subcutaneous BID  . isosorbide mononitrate  15 mg Oral Daily  . metoprolol succinate  75 mg Oral Daily  . metronidazole  500 mg Intravenous Q8H  . rosuvastatin  20 mg Oral q1800   Infusions:  . heparin 1,600 Units/hr (10/24/15 2003)    Assessment:  70 yr male with significant PMH including MI, TIA, DM, HTN, AFib, DVT  Patient was on Xarelto 20mg  daily PTA but pt reports has not taken med within the past month due to inability to obtain medication.  Patient presents with abdominal pain, N/V.  CT Abdomen shows partial ileal obstruction  and questionable enteritis.  Pharmacy consulted to dose IV heparin as bridge therapy with plans for colonoscopy 7/18 AM.  Heparin resumed 7/18 s/p colonoscopy without a bolus  Today, 10/25/2015  Heparin level therapeutic this AM on 1600 units/hr  H/H slightly decreased, Plts wnl  No reported bleeding  Goal of Therapy:  Heparin level 0.3-0.7 units/ml Monitor platelets by anticoagulation protocol: Yes   Plan:   Continue heparin level at 1600 units/hr  Daily heparin level and CBC  F/u plans for resuming oral anticoagulation when appropriate  Loralee Pacas, PharmD, BCPS Pager: [phone]  10/25/2015, 7:23 AM

## 2015-10-25 NOTE — Progress Notes (Signed)
Progress Note   Subjective  Chief Complaint:Right lower quadrant abdominal pain  Leroy [name] is a 70 year old African-American male who presented to the ER on 10/22/15 a chief complaint abdominal pain, nausea, vomiting and diarrhea. Patient did have colonoscopy on 10/24/15 with Dr. Christella Hartigan, finding showed severe, circumferential, ulcerated inflammation in the distal ileum and cecum. The bowel did not appear necrotic. Biopsies were taken of this, though it was thought to be more consistent with ischemic disease than IBD.  This morning patient tells me that he has been able to eat some clear liquids, but not very much. He continues with a small amount of right lower quadrant abdominal pain, but admits that this is much better than before. He denies passing gas or having a bowel movement since time of procedure yesterday.   Objective   Vital signs in last 24 hours: Temp:  [97.4 F (36.3 C)-99.6 F (37.6 C)] 98.4 F (36.9 C) (07/19 0800) Pulse Rate:  [81-94] 90 (07/19 0800) Resp:  [17-39] 30 (07/19 0800) BP: (108-140)/(22-81) 126/62 mmHg (07/19 0800) SpO2:  [96 %-98 %] 97 % (07/19 0800) Weight:  [287 lb 14.7 oz (130.6 kg)] 287 lb 14.7 oz (130.6 kg) (07/19 0500) Last BM Date: 10/24/15 General: Morbidly obese, fatigued African-American male in no acute distress Heart:  Regular rate and rhythm; no murmurs Lungs: Respirations even and unlabored, lungs CTA bilaterally Abdomen:  Soft,  moderate TTP and right lower quadrantand nondistended. Normal bowel sounds. Extremities:  Without edema. Neurologic:  Alert and oriented,  grossly normal neurologically. Psych:  Cooperative. Normal mood and affect.  Intake/Output from previous day: 07/18 0701 - 07/19 0700 In: 892 [I.V.:492; IV Piggyback:400] Out: 400 [Urine:400] Intake/Output this shift: Total I/O In: 16 [I.V.:16] Out: -   Lab Results:  Recent Labs  10/22/15 1826 10/24/15 0100 10/25/15 0332  WBC 15.1* 16.4* 16.2*  HGB 17.0 15.6 13.3   HCT 48.2 45.9 38.9*  PLT 184 171 184   BMET  Recent Labs  10/23/15 0444 10/24/15 0100 10/25/15 0332  NA 134* 136 135  K 4.8 4.0 3.2*  CL 101 102 106  CO2 GLUCOSE 314* 146* 95  BUN 16 22* 18  CREATININE 1.44* 1.67* 1.24  CALCIUM 8.3* 8.4* 7.5*   LFT No results for input(s): PROT, ALBUMIN, AST, ALT, ALKPHOS, BILITOT, BILIDIR, IBILI in the last 72 hours. PT/INR No results for input(s): LABPROT, INR in the last 72 hours.  Studies/Results: US Renal  10/24/2015  CLINICAL DATA:  Acute renal failure, history of diabetes and hypertension. EXAM: RENAL / URINARY TRACT ULTRASOUND COMPLETE COMPARISON:  Abdominal and pelvic CT scan with contrast dated October 22, 2015 FINDINGS: Right Kidney: Length: 13.2 cm. The renal cortical echotexture is increased and is approximately equal to that of the liver. There is no hydronephrosis. No discrete mass is observed. The area of presumed scarring in the posterior aspect of the midpole of the right kidney seen on the CT scan of 16 July is not observed on today's ultrasound. Left Kidney: Length: 13.1 cm. The renal cortical echotexture is mildly increased similar to that on the right. There is no hydronephrosis. Bladder: Appears normal for degree of bladder distention. IMPRESSION: Increased renal cortical echotexture consistent with medical renal disease. There is no hydronephrosis. The urinary bladder is unremarkable. Electronically Signed   By: David  [place] M.D.   On: 10/24/2015 13:46   Dg Abd 2 Views  10/23/2015  CLINICAL DATA:  Generalized abdominal pain. EXAM: ABDOMEN - 2  VIEW COMPARISON:  CT scan of October 22, 2015. FINDINGS: The bowel gas pattern is normal. There is no evidence of free air. No radio-opaque calculi or other significant radiographic abnormality is seen. IMPRESSION: No evidence of bowel obstruction or ileus. Electronically Signed   By: Lupita Raider, M.D.   On: 10/23/2015 15:03   10/24/15-colonoscopy, Dr.  Christella Hartigan: Impression: Severe, circumferential, ulcerated inflammation in   distal ileum and cecum. The bowel did not appear   necrotic. Biospies were taken from ileum and cecum.   Overall more consistent with ischemic disease than   IBD.    Assessment / Plan:   Impression: 1. Right lower quadrant abdominal pain (presumed ischemic colitis/ileitis pending biopsies): 3 day history of increasing right lower quadrant abdominal pain, CT showed a partial ileal obstruction without definitive underlying cause, question of regional enteritis; lactate was minimally increased at 2.4 at time of admission, improved to 1.9 overnight, colonoscopy performed on 10/24/15, see report above likely ischemic disease 2. Abnormal CT of the abdomen: Showing partial ileal obstruction without definitive underlying cause with questionable regional enteritis; colonoscopy 3. Hyponatremia  Plan: 1. Continue supportive measures 2. Agree with nausea and pain control 3. Agree with clear liquid diet today 4. Out of bed to chair 5. Will discuss above with Dr. Christella Hartigan, please await any further recommendations  Thank you for your kind consultation, we will continue to follow.   LOS: 2 days   Unk Lightning  10/25/2015, 10:14 AM  Pager # [phone]   ________________________________________________________________________  [facility] MD note:  I personally examined the patient, reviewed the data and agree with the assessment and plan described above.  Awaiting final path results from distal ileum and cecum.  Would be unusual for IBD to present like this.  Continue supportive care with antibiotics for now.  He had a gap in blood thinner therapy because he ran out and could not get refills. Keeping that from happening again should be a priority here.  He's still having RLQ discomfort and is a bit  distended to me this early afternoon. Will order plain films (?developing bowel obstruction).  No vomiting yet and he does have bowel sounds so I doubt that to be the case.    Rob Bunting, MD [facility] Pager [phone]

## 2015-10-25 NOTE — Progress Notes (Signed)
Patient Profile: 70 y/o AA male with a h/o CAD s/p CABG, atrial fibrillation/ SVT on chronic anticoagulation with Xarelto, ischemic cardiomyopathy/ chronic systolic HF with EF of 25-30%, PVD s/p bilateral BKA, T2DM, HLD, CKD and prior CVA, who presented to [facility] 10/23/15 with complaint of RLQ abdominal pain + emesis. Abd CT suggested acute enteritis involving the ileum with partial obstruction. Exploratory colonoscopy 7/18 showed diffuse inflammation + ulcerations in the distal ileum. No signs of necrosis. Biopsy's pending. Cardiology consulted 7/18 for management of CHF and SVT.   Subjective: No complaints. Tolerated colonoscopy Ok. No events overnight.   Objective: Vital signs in last 24 hours: Temp:  [97.4 F (36.3 C)-99.6 F (37.6 C)] 98.4 F (36.9 C) (07/19 0800) Pulse Rate:  [81-95] 90 (07/19 0800) Resp:  [17-39] 30 (07/19 0800) BP: (91-140)/(22-81) 126/62 mmHg (07/19 0800) SpO2:  [92 %-98 %] 97 % (07/19 0800) Weight:  [287 lb 14.7 oz (130.6 kg)] 287 lb 14.7 oz (130.6 kg) (07/19 0500) Last BM Date: 10/24/15  Intake/Output from previous day: 07/18 0701 - 07/19 0700 In: 892 [I.V.:492; IV Piggyback:400] Out: 400 [Urine:400] Intake/Output this shift: Total I/O In: 16 [I.V.:16] Out: -   Medications Current Facility-Administered Medications  Medication Dose Route Frequency Provider Last Rate Last Dose  . aspirin EC tablet 81 mg  81 mg Oral Daily Hillary Bow, DO   81 mg at 10/23/15 0911  . ciprofloxacin (CIPRO) IVPB 400 mg  400 mg Intravenous BID Rachael Fee, MD   400 mg at 10/24/15 2136  . ezetimibe (ZETIA) tablet 10 mg  10 mg Oral Daily Hillary Bow, DO   10 mg at 10/23/15 0911  . heparin ADULT infusion 100 units/mL (25000 units/254mL sodium chloride 0.45%)  1,600 Units/hr Intravenous Continuous MEER REINDL, RPH 16 mL/hr at 10/24/15 2003 1,600 Units/hr at 10/24/15 2003  . hydrALAZINE (APRESOLINE) injection 10 mg  10 mg Intravenous Q6H PRN Leroy Sea, MD       . hydrALAZINE (APRESOLINE) tablet 25 mg  25 mg Oral Q8H Leroy Sea, MD   25 mg at 10/25/15 0619  . insulin aspart (novoLOG) injection 0-15 Units  0-15 Units Subcutaneous TID WC Marinda Elk, MD      . insulin aspart (novoLOG) injection 0-5 Units  0-5 Units Subcutaneous QHS Marinda Elk, MD      . insulin aspart (novoLOG) injection 3 Units  3 Units Subcutaneous TID WC Marinda Elk, MD      . insulin glargine (LANTUS) injection 25 Units  25 Units Subcutaneous BID Hillary Bow, DO   25 Units at 10/24/15 2136  . isosorbide mononitrate (IMDUR) 24 hr tablet 15 mg  15 mg Oral Daily Leroy Sea, MD   15 mg at 10/24/15 1000  . metoprolol (LOPRESSOR) injection 5 mg  5 mg Intravenous Q6H PRN Leroy Sea, MD      . metoprolol succinate (TOPROL-XL) 24 hr tablet 75 mg  75 mg Oral Daily Leroy Sea, MD   75 mg at 10/24/15 1000  . metroNIDAZOLE (FLAGYL) IVPB 500 mg  500 mg Intravenous Q8H Rachael Fee, MD   500 mg at 10/25/15 0454  . morphine 2 MG/ML injection 2-4 mg  2-4 mg Intravenous Q4H PRN Hillary Bow, DO   2 mg at 10/24/15 0439  . ondansetron (ZOFRAN) injection 4 mg  4 mg Intravenous Q6H PRN Hillary Bow, DO      . potassium chloride SA (  K-DUR,KLOR-CON) CR tablet 40 mEq  40 mEq Oral BID Marinda Elk, MD      . rosuvastatin (CRESTOR) tablet 20 mg  20 mg Oral q1800 Hillary Bow, DO   20 mg at 10/24/15 1705    PE: General: Pleasant, NAD, moderately obese Psych: Normal affect. Neuro: Alert and oriented X 3. Moves all extremities spontaneously. HEENT: Normal Neck: Supple without bruits or JVD. Lungs: Resp regular and unlabored, CTA. Heart: RRR no s3, s4, or murmurs. Abdomen: Soft, RLQ tenderness, non-distended, BS + x 4.  Extremities: s/p bilateral BKA. no edema. Radials 2+ and equal bilaterally.  Lab Results:   Recent Labs  10/22/15 1826 10/24/15 0100 10/25/15 0332  WBC 15.1* 16.4* 16.2*  HGB 17.0 15.6 13.3  HCT  48.2 45.9 38.9*  PLT 184 171 184   BMET  Recent Labs  10/23/15 0444 10/24/15 0100 10/25/15 0332  NA 134* 136 135  K 4.8 4.0 3.2*  CL 101 102 106  CO2 23 23 22   GLUCOSE 314* 146* 95  BUN 16 22* 18  CREATININE 1.44* 1.67* 1.24  CALCIUM 8.3* 8.4* 7.5*   Studies/Results: Colonoscopy 10/24/15  Findings: Diffuse severe , cirumferential inflammation characterized by erythema, friability and shallow ulcerations was found in the cecum. Biopsies were taken with a cold forceps for histology. No signs of necrosis. Two small sessile polyps were found in transverse segment, these were not removed given his overall clinical situation.  Assessment/Plan  Principal Problem:   RLQ abdominal pain Active Problems:   ATRIAL FIBRILLATION, PAROXYSMAL   Diabetes mellitus type 2 with peripheral artery disease (HCC)   Nausea and vomiting   Ischemic colitis (HCC)    1. RLQ Abdominal Pain: CT suggested acute enteritis involving the ileum with partial obstruction. Exploratory colonoscopy 7/18 showed diffuse inflammation + ulcerations in the distal ileum. No signs of necrosis. Biopsies pending.  Plan is for conservative management since operative risk is high. On Cipro + flagyl.   2. Ischemic Cardiomyopathy/ Chronic Systolic CHF: EF 44% on most recent echo 05/2015. He appears well compensated. Volume is stable by exam, however I/Os net + 4L. No significant dyspnea, orthopnea nor PND. He is receiving IVFs. Will need to monitor fluid status closely. Continued medical therapy for systolic HF, including ACE-I, BB, hydralazine + nitrate. PRN lasix, if needed for volume. ASA was restarted for CAD. Given his ischemic cardiomyopathy with EF of 25%, he would be high operative risk if surgery is needed.   3. Atrial Fibrillation/ SVT: Currently SR on telemetry with rate in the 90s. He has had burst of SVT on telemetry, responsive to IV adenosine and IV metoprolol.  Underlying GI illness + pain may be contributing  to rapid rates. Continue PO metoprolol for rate control, 75 mg daily. Can further titrate if BP allows. Ok to give as long as systolic BP is >100. Not a candidate for Cardizem given low EF. Can continue PRN IV metoprolol or adenosine if needed. Keep K and Mg WNL. He will need supplemental K today as he is hypokalemic with K of 3.2. This patients CHA2DS2-VASc Score and unadjusted Ischemic Stroke Rate (% per year) is equal to 11.2 % stroke rate/year from a score of 7 (CHF, HTN, Age 73-74, DM, prior CVA(2), Vascular disease).  Above score calculated as 1 point each if present [CHF, HTN, DM, Vascular=MI/PAD/Aortic Plaque, Age if 65-74, or Male] Above score calculated as 2 points each if present [Age > 75, or Stroke/TIA/TE]  He has been on chronic  anticoagulation with Xarelto as an outpatient. This is currently on hold given recent colonoscopy + biopsy. He is being anticoagulated with IV heparin currently. Given calculated stroke risk as outlined above, please resume Xarelto once cleared by General surgery/ GI.     LOS: 2 days    Brittainy M. Sharol Harness, PA-C 10/25/2015 9:05 AM  History and all data above reviewed.  Patient examined.  I agree with the findings as above.  No chest pain or SOB.  Abdominal pain is improved.  The patient exam reveals COR:RRR  ,  Lungs: Clear  ,  Abd: Positive bowel sounds, no rebound no guarding, Ext No upper extremity edema  .  All available labs, radiology testing, previous records reviewed. Agree with documented assessment and plan. As above.  Resume Xarelto when OK with primary team/GI.  No change in cardiac meds.  We will follow as needed.    Fayrene Fearing Fia Hebert  11:57 AM  10/25/2015

## 2015-10-25 NOTE — Progress Notes (Addendum)
[name]    Progress Note  [name]  AVW:[id number] DOB: 08/03/1945 DOA: 10/22/2015 PCP: [name], MD     Brief Narrative:   Leroy [name] is an 70 y.o. male past medical history of peripheral vascular disease MI TIA bilateral below the knee amputation diabetes type 2 poorly controlled last A1c of 11.5 guiding 2016, came into the ED complaining with abdominal pain, CT scan of the abdomen was done that showed inflammation of the terminal ileum colonoscopy was performed with results below.  Assessment/Plan:   RLQ abdominal pain Due to ischemic bowel disease: CT scan with contrast of the abdomen and pelvis showed terminal ileum inflammation. Patient's peripheral vascular disease with history of paroxysmal atrial fibrillation, suspicious for ischemic bowel was high so colonoscopy was planned for 10/24/2015 that showed severe circumferential inflammation and ulceration of the distal ileum and cecum biopsies performed, gross inspection most consistent with ischemic disease. GI recommended to advance clear liquid diet. Antibiotics per GI  Paroxysmal atrial fibrillation: Rate controlled, continue beta blockers, Xarelto was held, currently on heparin.    Diabetes mellitus type 2 with peripheral artery disease (HCC) Continue long-acting insulin plus sliding-scale insulin.  Chronic systolic and diastolic heart failure with an EF of 25%: Cardiology was consulted, seems to be compensated currently on a beta blocker holding his due to acute renal failure, IV Imdur and hydralazine were added. Continue hold IV fluids. We'll allow clear liquid diet.  CAD: Chest pain-free on aspirin and beta blockers currently asymptomatic.  Peripheral arterial disease with bilateral BKA's: No wounds continue supportive care.  Acute kidney injury: ACE inhibitor was held he was given gentle IV fluid hydration. Creatinine returned to baseline.   DVT prophylaxis: heparin Family  Communication:none Disposition Plan/Barrier to D/C: transfer to med surg/ 24 hrs after transitioning to oral anticoagulation. Code Status:     Code Status Orders        Start     Ordered   10/23/15 0258  Full code   Continuous     10/23/15 0305    Code Status History    Date Active Date Inactive Code Status Order ID Comments User Context   05/22/2015  9:24 PM 05/24/2015  6:25 PM Full Code [id number]  Eduard Clos, MD Inpatient   04/20/2014  2:22 PM 04/22/2014  9:42 PM Full Code [id number]  Raymond Gurney, PA-C Inpatient   04/21/2013  6:50 PM 05/07/2013  5:50 PM Full Code [id number]  Charlton Amor, PA-C Inpatient   04/12/2013  7:53 PM 04/21/2013  6:50 PM Full Code [id number]  [name], NP Inpatient   03/25/2013  2:59 PM 03/28/2013  1:47 PM Full Code [id number]  [name], PA-C Inpatient   06/03/2012  4:37 PM 06/11/2012  7:07 PM Full Code [id number]  [name], MD Inpatient        IV Access:    Peripheral IV   Procedures and diagnostic studies:   US Renal  10/24/2015  CLINICAL DATA:  Acute renal failure, history of diabetes and hypertension. EXAM: RENAL / URINARY TRACT ULTRASOUND COMPLETE COMPARISON:  Abdominal and pelvic CT scan with contrast dated October 22, 2015 FINDINGS: Right Kidney: Length: 13.2 cm. The renal cortical echotexture is increased and is approximately equal to that of the liver. There is no hydronephrosis. No discrete mass is observed. The area of presumed scarring in the posterior aspect of the midpole of the right kidney seen on the CT scan of 16 July is  not observed on today's ultrasound. Left Kidney: Length: 13.1 cm. The renal cortical echotexture is mildly increased similar to that on the right. There is no hydronephrosis. Bladder: Appears normal for degree of bladder distention. IMPRESSION: Increased renal cortical echotexture consistent with medical renal disease. There is no hydronephrosis. The urinary bladder is unremarkable. Electronically Signed    By: David  [place] M.D.   On: 10/24/2015 13:46   Dg Abd 2 Views  10/23/2015  CLINICAL DATA:  Generalized abdominal pain. EXAM: ABDOMEN - 2 VIEW COMPARISON:  CT scan of October 22, 2015. FINDINGS: The bowel gas pattern is normal. There is no evidence of free air. No radio-opaque calculi or other significant radiographic abnormality is seen. IMPRESSION: No evidence of bowel obstruction or ileus. Electronically Signed   By: Lupita Raider, M.D.   On: 10/23/2015 15:03     Medical Consultants:    None.  Anti-Infectives:   Ciprofloxacin and Flagyl  Subjective:    [name] he relates no further abdominal pain, tolerating diet with no nausea vomiting tolerating diet  Objective:    Filed Vitals:   10/25/15 0500 10/25/15 0600 10/25/15 0619 10/25/15 0800  BP:  134/40 127/45   Pulse: 87 81    Temp:    98.4 F (36.9 C)  TempSrc:    Oral  Resp: 29 28    Height:      Weight: 130.6 kg (287 lb 14.7 oz)     SpO2: 98% 97%      Intake/Output Summary (Last 24 hours) at 10/25/15 0811 Last data filed at 10/25/15 0600  Gross per 24 hour  Intake    876 ml  Output    400 ml  Net    476 ml   Filed Weights   10/23/15 0529 10/25/15 0500  Weight: 128.1 kg (282 lb 6.6 oz) 130.6 kg (287 lb 14.7 oz)    Exam: General exam: In no acute distress. Respiratory system: Good air movement and clear to auscultation. Cardiovascular system: S1 & S2 heard, RRR. No JVD. Gastrointestinal system: Abdomen is nondistended, soft and nontender.  Central nervous system: Alert and oriented. No focal neurological deficits. Extremities: Below-the-knee amputation bilaterally. Skin: No rashes, lesions or ulcers Psychiatry: Judgement and insight appear normal. Mood & affect appropriate.    Data Reviewed:    Labs: Basic Metabolic Panel:  Recent Labs Lab 10/22/15 2046 10/23/15 0444 10/24/15 0100 10/24/15 0101 10/25/15 0332  NA 133* 134* 136  --  135  K 4.1 4.8 4.0  --  3.2*  CL 98* 101 102  --  106    CO2 --  22  GLUCOSE 260* 314* 146*  --  95  BUN 12 16 22*  --  18  CREATININE 1.26* 1.44* 1.67*  --  1.24  CALCIUM 8.9 8.3* 8.4*  --  7.5*  MG  --   --   --  1.9  --    GFR Estimated Creatinine Clearance: 76.4 mL/min (by C-G formula based on Cr of 1.24). Liver Function Tests: No results for input(s): AST, ALT, ALKPHOS, BILITOT, PROT, ALBUMIN in the last 168 hours.  Recent Labs Lab 10/22/15 2046  LIPASE 14   No results for input(s): AMMONIA in the last 168 hours. Coagulation profile No results for input(s): INR, PROTIME in the last 168 hours.  CBC:  Recent Labs Lab 10/22/15 1826 10/24/15 0100 10/25/15 0332  WBC 15.1* 16.4* 16.2*  HGB 17.0 15.6 13.3  HCT 48.2 45.9 38.9*  MCV 87.3 90.2 88.8  PLT 184 171 184   Cardiac Enzymes: No results for input(s): CKTOTAL, CKMB, CKMBINDEX, TROPONINI in the last 168 hours. BNP (last 3 results) No results for input(s): PROBNP in the last 8760 hours. CBG:  Recent Labs Lab 10/24/15 1631 10/24/15 2025 10/24/15 2358 10/25/15 0345 10/25/15 0746  GLUCAP 187* 183* 151* 182* 76   D-Dimer: No results for input(s): DDIMER in the last 72 hours. Hgb A1c: No results for input(s): HGBA1C in the last 72 hours. Lipid Profile: No results for input(s): CHOL, HDL, LDLCALC, TRIG, CHOLHDL, LDLDIRECT in the last 72 hours. Thyroid function studies: No results for input(s): TSH, T4TOTAL, T3FREE, THYROIDAB in the last 72 hours.  Invalid input(s): FREET3 Anemia work up: No results for input(s): VITAMINB12, FOLATE, FERRITIN, TIBC, IRON, RETICCTPCT in the last 72 hours. Sepsis Labs:  Recent Labs Lab 10/22/15 1826 10/23/15 0011 10/23/15 0316 10/23/15 0548 10/24/15 0100 10/25/15 0332  WBC 15.1*  --   --   --  16.4* 16.2*  LATICACIDVEN  --  2.40* 2.62* 1.9  --   --    Microbiology Recent Results (from the past 240 hour(s))  MRSA PCR Screening     Status: None   Collection Time: 10/23/15  5:38 AM  Result Value Ref Range Status    MRSA by PCR NEGATIVE NEGATIVE Final    Comment:        The GeneXpert MRSA Assay (FDA approved for NASAL specimens only), is one component of a comprehensive MRSA colonization surveillance program. It is not intended to diagnose MRSA infection nor to guide or monitor treatment for MRSA infections.      Medications:   . aspirin EC  81 mg Oral Daily  . ciprofloxacin  400 mg Intravenous BID  . ezetimibe  10 mg Oral Daily  . hydrALAZINE  25 mg Oral Q8H  . insulin aspart  0-15 Units Subcutaneous Q4H  . insulin glargine  25 Units Subcutaneous BID  . isosorbide mononitrate  15 mg Oral Daily  . metoprolol succinate  75 mg Oral Daily  . metronidazole  500 mg Intravenous Q8H  . rosuvastatin  20 mg Oral q1800   Continuous Infusions: . heparin 1,600 Units/hr (10/24/15 2003)    Time spent: 25 min   LOS: 2 days   Marinda Elk  [facility] Pager [phone]  *Please refer to [email or web address], password [email or web address] to get updated schedule on who will round on this patient, as hospitalists switch teams weekly. If 7PM-7AM, please contact night-coverage at [email or web address], password [email or web address] for any overnight needs.  10/25/2015, 8:11 AM

## 2015-10-25 NOTE — Progress Notes (Signed)
Patient ID: Leroy [name], male   DOB: 18-Dec-1945, 70 y.o.   MRN: [id number] 1 Day Post-Op  Subjective: Very tired, did not sleep well last night.States right lower quadrant abdominal pain is still present but better daily. No nausea or vomiting.Has not tried liquids yet.  Objective: Vital signs in last 24 hours: Temp:  [97.4 F (36.3 C)-99.6 F (37.6 C)] 98.4 F (36.9 C) (07/19 0800) Pulse Rate:  [81-95] 90 (07/19 0800) Resp:  [17-39] 30 (07/19 0800) BP: (91-140)/(22-81) 126/62 mmHg (07/19 0800) SpO2:  [92 %-98 %] 97 % (07/19 0800) Weight:  [130.6 kg (287 lb 14.7 oz)] 130.6 kg (287 lb 14.7 oz) (07/19 0500) Last BM Date: 10/24/15  Intake/Output from previous day: 07/18 0701 - 07/19 0700 In: 892 [I.V.:492; IV Piggyback:400] Out: 400 [Urine:400] Intake/Output this shift: Total I/O In: 16 [I.V.:16] Out: -   General appearance: fatigued, no distress and morbidly obese GI: abnormal findings:  obese and moderate tenderness in the RLQ  Lab Results:   Recent Labs  10/24/15 0100 10/25/15 0332  WBC 16.4* 16.2*  HGB 15.6 13.3  HCT 45.9 38.9*  PLT 171 184   BMET  Recent Labs  10/24/15 0100 10/25/15 0332  NA 136 135  K 4.0 3.2*  CL 102 106  CO2 23 22  GLUCOSE 146* 95  BUN 22* 18  CREATININE 1.67* 1.24  CALCIUM 8.4* 7.5*     Studies/Results: US Renal  10/24/2015  CLINICAL DATA:  Acute renal failure, history of diabetes and hypertension. EXAM: RENAL / URINARY TRACT ULTRASOUND COMPLETE COMPARISON:  Abdominal and pelvic CT scan with contrast dated October 22, 2015 FINDINGS: Right Kidney: Length: 13.2 cm. The renal cortical echotexture is increased and is approximately equal to that of the liver. There is no hydronephrosis. No discrete mass is observed. The area of presumed scarring in the posterior aspect of the midpole of the right kidney seen on the CT scan of 16 July is not observed on today's ultrasound. Left Kidney: Length: 13.1 cm. The renal cortical echotexture is  mildly increased similar to that on the right. There is no hydronephrosis. Bladder: Appears normal for degree of bladder distention. IMPRESSION: Increased renal cortical echotexture consistent with medical renal disease. There is no hydronephrosis. The urinary bladder is unremarkable. Electronically Signed   By: David  [place] M.D.   On: 10/24/2015 13:46   Dg Abd 2 Views  10/23/2015  CLINICAL DATA:  Generalized abdominal pain. EXAM: ABDOMEN - 2 VIEW COMPARISON:  CT scan of October 22, 2015. FINDINGS: The bowel gas pattern is normal. There is no evidence of free air. No radio-opaque calculi or other significant radiographic abnormality is seen. IMPRESSION: No evidence of bowel obstruction or ileus. Electronically Signed   By: Lupita Raider, M.D.   On: 10/23/2015 15:03    Anti-infectives: Anti-infectives    Start     Dose/Rate Route Frequency Ordered Stop   10/23/15 1200  metroNIDAZOLE (FLAGYL) IVPB 500 mg     500 mg 100 mL/hr over 60 Minutes Intravenous Every 8 hours 10/23/15 1034     10/23/15 1100  ciprofloxacin (CIPRO) IVPB 400 mg     400 mg 200 mL/hr over 60 Minutes Intravenous 2 times daily 10/23/15 1034        Assessment/Plan: s/p Procedure(s): COLONOSCOPY WITH PROPOFOL Acute enteritis which appears to be ischemic. Colonoscopy results reviewed. Gradual improvement with conservative management. He still is at some risk for perforation or stricture. Would be high operative risk. Continue conservative management. Continue  IV antibiotics.Try clear liquid diet today. Out of bed.   LOS: 2 days    Caraline Deutschman T 10/25/2015

## 2015-10-26 ENCOUNTER — Encounter: Payer: Commercial Managed Care - HMO | Admitting: Internal Medicine

## 2015-10-26 DIAGNOSIS — R1031 Right lower quadrant pain: Secondary | ICD-10-CM

## 2015-10-26 LAB — CBC
HCT: 42.7 % (ref 39.0–52.0)
HEMOGLOBIN: 14.5 g/dL (ref 13.0–17.0)
MCH: 30.4 pg (ref 26.0–34.0)
MCHC: 34 g/dL (ref 30.0–36.0)
MCV: 89.5 fL (ref 78.0–100.0)
Platelets: 189 10*3/uL (ref 150–400)
RBC: 4.77 MIL/uL (ref 4.22–5.81)
RDW: 13.1 % (ref 11.5–15.5)
WBC: 13.8 10*3/uL — ABNORMAL HIGH (ref 4.0–10.5)

## 2015-10-26 LAB — BASIC METABOLIC PANEL
Anion gap: 8 (ref 5–15)
BUN: 12 mg/dL (ref 6–20)
CO2: 20 mmol/L — ABNORMAL LOW (ref 22–32)
Calcium: 8 mg/dL — ABNORMAL LOW (ref 8.9–10.3)
Chloride: 106 mmol/L (ref 101–111)
Creatinine, Ser: 1.04 mg/dL (ref 0.61–1.24)
GFR calc Af Amer: >60 mL/min (ref >60)
Glucose, Bld: 164 mg/dL — ABNORMAL HIGH (ref 65–99)
POTASSIUM: 3.8 mmol/L (ref 3.5–5.1)
SODIUM: 134 mmol/L — AB (ref 135–145)

## 2015-10-26 LAB — GLUCOSE, CAPILLARY
GLUCOSE-CAPILLARY: 178 mg/dL — AB (ref 65–99)
GLUCOSE-CAPILLARY: 164 mg/dL — AB (ref 65–99)
Glucose-Capillary: 169 mg/dL — ABNORMAL HIGH (ref 65–99)
Glucose-Capillary: 232 mg/dL — ABNORMAL HIGH (ref 65–99)

## 2015-10-26 LAB — HEPARIN LEVEL (UNFRACTIONATED): HEPARIN UNFRACTIONATED: 0.39 [IU]/mL (ref 0.30–0.70)

## 2015-10-26 NOTE — Care Management Important Message (Signed)
Important Message  Patient Details  Name: Leroy [name] MRN: [id number] Date of Birth: [birth year]   Medicare Important Message Given:  Yes    Haskell Flirt 10/26/2015, 10:20 AMImportant Message  Patient Details  Name: Leroy [name] MRN: [id number] Date of Birth: 1945/04/24   Medicare Important Message Given:  Yes    Haskell Flirt 10/26/2015, 10:19 AM

## 2015-10-26 NOTE — Progress Notes (Signed)
[name]    Progress Note  [name]  ZOX:[id number] DOB: 1945-09-18 DOA: 10/22/2015 PCP: [name], MD     Brief Narrative:   Leroy [name] is an 70 y.o. male past medical history of peripheral vascular disease MI TIA bilateral below the knee amputation diabetes type 2 poorly controlled last A1c of 11.5 guiding 2016, came into the ED complaining with abdominal pain, CT scan of the abdomen was done that showed inflammation of the terminal ileum colonoscopy was performed with results below.  Assessment/Plan:   RLQ abdominal pain Due to ischemic bowel disease: GI was consulted recommended colonoscopy on 10/24/2015 that showed severe circumferential inflammation and ulceration of the distal ileum and cecum biopsies performed,  Patient is tolerating diet continue empiric antibiotics. Appreciate specialist assistance, she still high risk for perforation. Tolerating clear liquid diet.  Paroxysmal atrial fibrillation: Rate controlled, continue beta blockers and heparin. Consult case manager for Xarelto assistance.  Diabetes mellitus type 2 with peripheral artery disease (HCC) Continue long-acting insulin plus sliding-scale insulin.  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 Seems to be compensated currently on a beta blocker. Acute renal failure resolved, lisinopril has been started her pressure improved.  CAD: Chest pain-free on aspirin and beta blockers currently asymptomatic.  Peripheral arterial disease with bilateral BKA's: No wounds continue supportive care.  Acute kidney injury: ACE inhibitor was held, she was given gentle IV fluid hydration.    DVT prophylaxis: heparin Family Communication:none Disposition Plan/Barrier to D/C: 24 hrs after transitioning to oral anticoagulation. Code Status:     Code Status Orders        Start     Ordered   10/23/15 0258  Full code   Continuous       10/23/15 0305    Code Status History    Date Active Date Inactive Code Status Order ID Comments User Context   05/22/2015  9:24 PM 05/24/2015  6:25 PM Full Code [id number]  Eduard Clos, MD Inpatient   04/20/2014  2:22 PM 04/22/2014  9:42 PM Full Code [id number]  Raymond Gurney, PA-C Inpatient   04/21/2013  6:50 PM 05/07/2013  5:50 PM Full Code [id number]  Charlton Amor, PA-C Inpatient   04/12/2013  7:53 PM 04/21/2013  6:50 PM Full Code [id number]  [name], NP Inpatient   03/25/2013  2:59 PM 03/28/2013  1:47 PM Full Code [id number]  [name], PA-C Inpatient   06/03/2012  4:37 PM 06/11/2012  7:07 PM Full Code [id number]  [name], MD Inpatient        IV Access:    Peripheral IV   Procedures and diagnostic studies:   US Renal  10/24/2015  CLINICAL DATA:  Acute renal failure, history of diabetes and hypertension. EXAM: RENAL / URINARY TRACT ULTRASOUND COMPLETE COMPARISON:  Abdominal and pelvic CT scan with contrast dated October 22, 2015 FINDINGS: Right Kidney: Length: 13.2 cm. The renal cortical echotexture is increased and is approximately equal to that of the liver. There is no hydronephrosis. No discrete mass is observed. The area of presumed scarring in the posterior aspect of the midpole of the right kidney seen on the CT scan of 16 July is not observed on today's ultrasound. Left Kidney: Length: 13.1 cm. The renal cortical echotexture is mildly increased similar to that on the right. There is no hydronephrosis. Bladder: Appears normal for degree of bladder distention. IMPRESSION: Increased renal cortical echotexture  consistent with medical renal disease. There is no hydronephrosis. The urinary bladder is unremarkable. Electronically Signed   By: David  [place] M.D.   On: 10/24/2015 13:46   Dg Abd 2 Views  10/25/2015  CLINICAL DATA:  Continued abdominal pain and distention. EXAM: ABDOMEN - 2 VIEW COMPARISON:  10/23/2015 FINDINGS: Right-side-up decubitus film shows no  evidence for intraperitoneal free air. Supine film shows diffuse gaseous distention of large and small bowel, slightly progressive in the interval. IMPRESSION: Worsening gaseous distention of small bowel and colon suggests evolving ileus. Electronically Signed   By: Kennith Center M.D.   On: 10/25/2015 14:29     Medical Consultants:    None.  Anti-Infectives:   Ciprofloxacin and Flagyl  Subjective:    [name] he relates no further abdominal pain, tolerating diet with no nausea or vomiting, had a bowel movement last night it was soft.  Objective:    Filed Vitals:   10/25/15 0800 10/25/15 1438 10/25/15 2059 10/26/15 0528  BP: 126/62 142/59 150/52 122/78  Pulse: 90 82 92 81  Temp: 98.4 F (36.9 C) 98 F (36.7 C) 98.8 F (37.1 C) 98.6 F (37 C)  TempSrc: Oral Oral Oral Oral  Resp: 30 20 20 20   Height:      Weight:      SpO2: 97% 96% 95% 98%    Intake/Output Summary (Last 24 hours) at 10/26/15 0750 Last data filed at 10/26/15 0726  Gross per 24 hour  Intake    520 ml  Output   1700 ml  Net  -1180 ml   Filed Weights   10/23/15 0529 10/25/15 0500  Weight: 128.1 kg (282 lb 6.6 oz) 130.6 kg (287 lb 14.7 oz)    Exam: General exam: In no acute distress. Respiratory system: Good air movement and clear to auscultation. Cardiovascular system: S1 & S2 heard, RRR. No JVD. Gastrointestinal system: Abdomen is nondistended, soft and nontender.  Central nervous system: Alert and oriented. No focal neurological deficits. Extremities: Below-the-knee amputation bilaterally. Skin: No rashes, lesions or ulcers Psychiatry: Judgement and insight appear normal. Mood & affect appropriate.    Data Reviewed:    Labs: Basic Metabolic Panel:  Recent Labs Lab 10/22/15 2046 10/23/15 0444 10/24/15 0100 10/24/15 0101 10/25/15 0332 10/26/15 0430  NA 133* 134* 136  --  135 134*  K 4.1 4.8 4.0  --  3.2* 3.8  CL 98* 101 102  --  106 106  CO2 28 23 23   --  22 20*  GLUCOSE  260* 314* 146*  --  95 164*  BUN 12 16 22*  --  18 12  CREATININE 1.26* 1.44* 1.67*  --  1.24 1.04  CALCIUM 8.9 8.3* 8.4*  --  7.5* 8.0*  MG  --   --   --  1.9  --   --    GFR Estimated Creatinine Clearance: 91.1 mL/min (by C-G formula based on Cr of 1.04). Liver Function Tests: No results for input(s): AST, ALT, ALKPHOS, BILITOT, PROT, ALBUMIN in the last 168 hours.  Recent Labs Lab 10/22/15 2046  LIPASE 14   No results for input(s): AMMONIA in the last 168 hours. Coagulation profile No results for input(s): INR, PROTIME in the last 168 hours.  CBC:  Recent Labs Lab 10/22/15 1826 10/24/15 0100 10/25/15 0332 10/26/15 0430  WBC 15.1* 16.4* 16.2* 13.8*  HGB 17.0 15.6 13.3 14.5  HCT 48.2 45.9 38.9* 42.7  MCV 87.3 90.2 88.8 89.5  PLT 184 171 184  189   Cardiac Enzymes: No results for input(s): CKTOTAL, CKMB, CKMBINDEX, TROPONINI in the last 168 hours. BNP (last 3 results) No results for input(s): PROBNP in the last 8760 hours. CBG:  Recent Labs Lab 10/25/15 0345 10/25/15 0746 10/25/15 1351 10/25/15 1647 10/25/15 2152  GLUCAP 182* 76 126* 118* 178*   D-Dimer: No results for input(s): DDIMER in the last 72 hours. Hgb A1c: No results for input(s): HGBA1C in the last 72 hours. Lipid Profile: No results for input(s): CHOL, HDL, LDLCALC, TRIG, CHOLHDL, LDLDIRECT in the last 72 hours. Thyroid function studies: No results for input(s): TSH, T4TOTAL, T3FREE, THYROIDAB in the last 72 hours.  Invalid input(s): FREET3 Anemia work up: No results for input(s): VITAMINB12, FOLATE, FERRITIN, TIBC, IRON, RETICCTPCT in the last 72 hours. Sepsis Labs:  Recent Labs Lab 10/22/15 1826 10/23/15 0011 10/23/15 0316 10/23/15 0548 10/24/15 0100 10/25/15 0332 10/26/15 0430  WBC 15.1*  --   --   --  16.4* 16.2* 13.8*  LATICACIDVEN  --  2.40* 2.62* 1.9  --   --   --    Microbiology Recent Results (from the past 240 hour(s))  MRSA PCR Screening     Status: None   Collection  Time: 10/23/15  5:38 AM  Result Value Ref Range Status   MRSA by PCR NEGATIVE NEGATIVE Final    Comment:        The GeneXpert MRSA Assay (FDA approved for NASAL specimens only), is one component of a comprehensive MRSA colonization surveillance program. It is not intended to diagnose MRSA infection nor to guide or monitor treatment for MRSA infections.      Medications:   . aspirin EC  81 mg Oral Daily  . ciprofloxacin  400 mg Intravenous BID  . ezetimibe  10 mg Oral Daily  . insulin aspart  0-15 Units Subcutaneous TID WC  . insulin aspart  0-5 Units Subcutaneous QHS  . insulin aspart  3 Units Subcutaneous TID WC  . insulin glargine  25 Units Subcutaneous BID  . lisinopril  5 mg Oral Daily  . metoprolol succinate  75 mg Oral Daily  . metronidazole  500 mg Intravenous Q8H  . rosuvastatin  20 mg Oral q1800   Continuous Infusions: . heparin 1,600 Units/hr (10/26/15 0513)    Time spent: 15 min   LOS: 3 days   Marinda Elk  [facility] Pager [phone]  *Please refer to [email or web address], password [email or web address] to get updated schedule on who will round on this patient, as hospitalists switch teams weekly. If 7PM-7AM, please contact night-coverage at [email or web address], password [email or web address] for any overnight needs.  10/26/2015, 7:50 AM

## 2015-10-26 NOTE — Progress Notes (Signed)
ANTICOAGULATION CONSULT NOTE - Follow Up  Pharmacy Consult for Heparin (Xarelto on hold) Indication: AFib, H/O DVT  Allergies  Allergen Reactions  . Statins Other (See Comments)    Reaction:  Muscle pain   . Amlodipine Besylate Other (See Comments)    Reaction:  Muscle pain   . Cephalexin Hives    Patient Measurements: Height: 5\' 9"  (175.3 cm) Weight: 287 lb 14.7 oz (130.6 kg) IBW/kg (Calculated) : 70.7 Heparin Dosing Weight: 100 kg  Vital Signs: Temp: 98.6 F (37 C) (07/20 0528) Temp Source: Oral (07/20 0528) BP: 122/78 mmHg (07/20 0528) Pulse Rate: 81 (07/20 0528)  Labs:  Recent Labs  10/24/15 0100 10/24/15 2024 10/25/15 0332 10/26/15 0430  HGB 15.6  --  13.3 14.5  HCT 45.9  --  38.9* 42.7  PLT 171  --  184 189  HEPARINUNFRC  --  0.37 0.37 0.39  CREATININE 1.67*  --  1.24 1.04    Estimated Creatinine Clearance: 91.1 mL/min (by C-G formula based on Cr of 1.04).   Medications:  Scheduled:  . aspirin EC  81 mg Oral Daily  . ciprofloxacin  400 mg Intravenous BID  . ezetimibe  10 mg Oral Daily  . insulin aspart  0-15 Units Subcutaneous TID WC  . insulin aspart  0-5 Units Subcutaneous QHS  . insulin aspart  3 Units Subcutaneous TID WC  . insulin glargine  25 Units Subcutaneous BID  . lisinopril  5 mg Oral Daily  . metoprolol succinate  75 mg Oral Daily  . metronidazole  500 mg Intravenous Q8H  . rosuvastatin  20 mg Oral q1800   Infusions:  . heparin 1,600 Units/hr (10/26/15 0513)    Assessment:  70 yr male with significant PMH including MI, TIA, DM, HTN, AFib, DVT  Patient was on Xarelto 20mg  daily PTA but pt reports has not taken med within the past month due to inability to obtain medication.  Patient presents with abdominal pain, N/V.  CT Abdomen shows partial ileal obstruction and questionable enteritis.  Pharmacy consulted to dose IV heparin as bridge therapy with plans for colonoscopy 7/18 AM.  Heparin resumed 7/18 s/p colonoscopy without a  bolus  Today, 10/26/2015  Heparin level therapeutic this AM on 1600 units/hr  Cbc stable  No bleeding documented  Goal of Therapy:  Heparin level 0.3-0.7 units/ml Monitor platelets by anticoagulation protocol: Yes   Plan:   Continue heparin level at 1600 units/hr  Daily heparin level and CBC  F/u plans for resuming oral anticoagulation when appropriate  Dorna Leitz, PharmD, BCPS 10/26/2015 7:32 AM

## 2015-10-26 NOTE — Progress Notes (Signed)
Progress Note   Subjective  Chief Complaint:Right lower quadrant abdominal pain  Leroy [name] is a 70 y/o AA male who presented to the ER on 10/22/15 with a chief complaint of abdominal pain, nausea, vomiting and diarrhea.  This morning the patient tells me he did have "multiple loose" stools last night and has had a continued decrease in abdominal pain. He has not tried eating his regular diet this morning because "I thought I was supposed to be on liquids". He denies any new complaints or concerns.    Objective   Vital signs in last 24 hours: Temp:  [98 F (36.7 C)-98.8 F (37.1 C)] 98.6 F (37 C) (07/20 0528) Pulse Rate:  [81-92] 81 (07/20 0528) Resp:  [20] 20 (07/20 0528) BP: (122-150)/(52-78) 122/78 mmHg (07/20 0528) SpO2:  [95 %-98 %] 98 % (07/20 0528) Last BM Date: 10/25/15 General: Morbidly obese, alert African-American male in no acute distress Heart: Regular rate and rhythm; no murmurs Lungs: Respirations even and unlabored, lungs CTA bilaterally Abdomen: Soft, mild TTP in right lower quadrant and nondistended. Normal bowel sounds. Extremities: Without edema. Neurologic: Alert and oriented, grossly normal neurologically. Psych: Cooperative. Normal mood and affect.  Intake/Output from previous day: 07/19 0701 - 07/20 0700 In: 520 [P.O.:240; I.V.:80; IV Piggyback:200] Out: 1400 [Urine:1400] Intake/Output this shift: Total I/O In: -  Out: 300 [Urine:300]  Lab Results:  Recent Labs  10/24/15 0100 10/25/15 0332 10/26/15 0430  WBC 16.4* 16.2* 13.8*  HGB 15.6 13.3 14.5  HCT 45.9 38.9* 42.7  PLT 171 184 189   BMET  Recent Labs  10/24/15 0100 10/25/15 0332 10/26/15 0430  NA 136 135 134*  K 4.0 3.2* 3.8  CL 102 106 106  CO2 23 22 20*  GLUCOSE 146* 95 164*  BUN 22* 18 12  CREATININE 1.67* 1.24 1.04  CALCIUM 8.4* 7.5* 8.0*   LFT No results for input(s): PROT, ALBUMIN, AST, ALT, ALKPHOS, BILITOT, BILIDIR, IBILI in the last 72 hours. PT/INR No  results for input(s): LABPROT, INR in the last 72 hours.  Studies/Results: US Renal  10/24/2015  CLINICAL DATA:  Acute renal failure, history of diabetes and hypertension. EXAM: RENAL / URINARY TRACT ULTRASOUND COMPLETE COMPARISON:  Abdominal and pelvic CT scan with contrast dated October 22, 2015 FINDINGS: Right Kidney: Length: 13.2 cm. The renal cortical echotexture is increased and is approximately equal to that of the liver. There is no hydronephrosis. No discrete mass is observed. The area of presumed scarring in the posterior aspect of the midpole of the right kidney seen on the CT scan of 16 July is not observed on today's ultrasound. Left Kidney: Length: 13.1 cm. The renal cortical echotexture is mildly increased similar to that on the right. There is no hydronephrosis. Bladder: Appears normal for degree of bladder distention. IMPRESSION: Increased renal cortical echotexture consistent with medical renal disease. There is no hydronephrosis. The urinary bladder is unremarkable. Electronically Signed   By: David  [place] M.D.   On: 10/24/2015 13:46   Dg Abd 2 Views  10/25/2015  CLINICAL DATA:  Continued abdominal pain and distention. EXAM: ABDOMEN - 2 VIEW COMPARISON:  10/23/2015 FINDINGS: Right-side-up decubitus film shows no evidence for intraperitoneal free air. Supine film shows diffuse gaseous distention of large and small bowel, slightly progressive in the interval. IMPRESSION: Worsening gaseous distention of small bowel and colon suggests evolving ileus. Electronically Signed   By: Kennith Center M.D.   On: 10/25/2015 14:29       Assessment /  Plan:   Impression: 1. Ischemic colitis/ileitis: 3 day history of increasing right lower quadrant abdominal pain, CT showed a partial ileal obstruction without definitive underlying cause, question of regional enteritis; colonoscopy performed on 10/24/15, pathology consistent with ischemic disease 2. Abnormal CT of the abdomen: as above 3.  Hyponatremia  Plan: 1. Continue supportive measures 2. Agree with nausea and pain control 3. Agree with advancing diet today as per surgical recommendations 4. Will discuss above with Dr. Christella Hartigan, please await any further recommendations  Thank you for your kind consultation, we will continue to follow.    LOS: 3 days   Unk Lightning  10/26/2015, 9:40 AM  Pager # [phone]   ________________________________________________________________________  [facility] MD note:  I personally examined the patient, reviewed the data and agree with the assessment and plan described above. Final pathology was consistent with ischemic disease.     Rob Bunting, MD [facility] Pager [phone]

## 2015-10-26 NOTE — Progress Notes (Signed)
Patient ID: Leroy [name], male   DOB: July 28, 1945, 70 y.o.   MRN: [id number] 2 Days Post-Op  Subjective: Pain is not gone but continues to get gradually better. Tolerating a full liquid diet.Had several bowel movements.  Objective: Vital signs in last 24 hours: Temp:  [98 F (36.7 C)-98.8 F (37.1 C)] 98.6 F (37 C) (07/20 0528) Pulse Rate:  [81-92] 81 (07/20 0528) Resp:  [20] 20 (07/20 0528) BP: (122-150)/(52-78) 122/78 mmHg (07/20 0528) SpO2:  [95 %-98 %] 98 % (07/20 0528) Last BM Date: 10/25/15  Intake/Output from previous day: 07/19 0701 - 07/20 0700 In: 520 [P.O.:240; I.V.:80; IV Piggyback:200] Out: 1400 [Urine:1400] Intake/Output this shift: Total I/O In: -  Out: 300 [Urine:300]  General appearance: alert, cooperative, no distress and morbidly obese GI: obese. Soft with very mild right lower quadrant tenderness and no guarding or peritoneal signs.  Lab Results:   Recent Labs  10/25/15 0332 10/26/15 0430  WBC 16.2* 13.8*  HGB 13.3 14.5  HCT 38.9* 42.7  PLT 184 189   BMET  Recent Labs  10/25/15 0332 10/26/15 0430  NA 135 134*  K 3.2* 3.8  CL 106 106  CO2 22 20*  GLUCOSE 95 164*  BUN 18 12  CREATININE 1.24 1.04  CALCIUM 7.5* 8.0*     Studies/Results: US Renal  10/24/2015  CLINICAL DATA:  Acute renal failure, history of diabetes and hypertension. EXAM: RENAL / URINARY TRACT ULTRASOUND COMPLETE COMPARISON:  Abdominal and pelvic CT scan with contrast dated October 22, 2015 FINDINGS: Right Kidney: Length: 13.2 cm. The renal cortical echotexture is increased and is approximately equal to that of the liver. There is no hydronephrosis. No discrete mass is observed. The area of presumed scarring in the posterior aspect of the midpole of the right kidney seen on the CT scan of 16 July is not observed on today's ultrasound. Left Kidney: Length: 13.1 cm. The renal cortical echotexture is mildly increased similar to that on the right. There is no hydronephrosis.  Bladder: Appears normal for degree of bladder distention. IMPRESSION: Increased renal cortical echotexture consistent with medical renal disease. There is no hydronephrosis. The urinary bladder is unremarkable. Electronically Signed   By: David  [place] M.D.   On: 10/24/2015 13:46   Dg Abd 2 Views  10/25/2015  CLINICAL DATA:  Continued abdominal pain and distention. EXAM: ABDOMEN - 2 VIEW COMPARISON:  10/23/2015 FINDINGS: Right-side-up decubitus film shows no evidence for intraperitoneal free air. Supine film shows diffuse gaseous distention of large and small bowel, slightly progressive in the interval. IMPRESSION: Worsening gaseous distention of small bowel and colon suggests evolving ileus. Electronically Signed   By: Kennith Center M.D.   On: 10/25/2015 14:29    Anti-infectives: Anti-infectives    Start     Dose/Rate Route Frequency Ordered Stop   10/23/15 1200  metroNIDAZOLE (FLAGYL) IVPB 500 mg     500 mg 100 mL/hr over 60 Minutes Intravenous Every 8 hours 10/23/15 1034     10/23/15 1100  ciprofloxacin (CIPRO) IVPB 400 mg     400 mg 200 mL/hr over 60 Minutes Intravenous 2 times daily 10/23/15 1034        Assessment/Plan: s/p Procedure(s): COLONOSCOPY WITH PROPOFOL Acute enteritis, apparent ischemic. He continues to gradually improve. I will advance to a soft diet. Continue IV antibiotics and observation.    LOS: 3 days    Tim Wilhide T 10/26/2015

## 2015-10-27 LAB — BASIC METABOLIC PANEL
ANION GAP: 6 (ref 5–15)
BUN: 12 mg/dL (ref 6–20)
CO2: 25 mmol/L (ref 22–32)
Calcium: 7.9 mg/dL — ABNORMAL LOW (ref 8.9–10.3)
Chloride: 106 mmol/L (ref 101–111)
Creatinine, Ser: 1.08 mg/dL (ref 0.61–1.24)
GFR calc Af Amer: >60 mL/min (ref >60)
GFR calc non Af Amer: >60 mL/min (ref >60)
GLUCOSE: 114 mg/dL — AB (ref 65–99)
POTASSIUM: 3.6 mmol/L (ref 3.5–5.1)
Sodium: 137 mmol/L (ref 135–145)

## 2015-10-27 LAB — CBC
HCT: 41.3 % (ref 39.0–52.0)
HEMOGLOBIN: 14.1 g/dL (ref 13.0–17.0)
MCH: 30.2 pg (ref 26.0–34.0)
MCHC: 34.1 g/dL (ref 30.0–36.0)
MCV: 88.4 fL (ref 78.0–100.0)
Platelets: 185 10*3/uL (ref 150–400)
RBC: 4.67 MIL/uL (ref 4.22–5.81)
RDW: 13.1 % (ref 11.5–15.5)
WBC: 9.8 10*3/uL (ref 4.0–10.5)

## 2015-10-27 LAB — HEPARIN LEVEL (UNFRACTIONATED)
HEPARIN UNFRACTIONATED: 0.24 [IU]/mL — AB (ref 0.30–0.70)
HEPARIN UNFRACTIONATED: 0.5 [IU]/mL (ref 0.30–0.70)
Heparin Unfractionated: 0.42 IU/mL (ref 0.30–0.70)

## 2015-10-27 LAB — GLUCOSE, CAPILLARY
GLUCOSE-CAPILLARY: 229 mg/dL — AB (ref 65–99)
Glucose-Capillary: 170 mg/dL — ABNORMAL HIGH (ref 65–99)
Glucose-Capillary: 274 mg/dL — ABNORMAL HIGH (ref 65–99)
Glucose-Capillary: 234 mg/dL — ABNORMAL HIGH (ref 65–99)

## 2015-10-27 MED ORDER — HEPARIN (PORCINE) IN NACL 100-0.45 UNIT/ML-% IJ SOLN
1800.0000 [IU]/h | INTRAMUSCULAR | Status: DC
  Administered 2015-10-27: 1800 [IU]/h via INTRAVENOUS
  Filled 2015-10-27 (×3): qty 250

## 2015-10-27 MED ORDER — METRONIDAZOLE 500 MG PO TABS
500.0000 mg | ORAL_TABLET | Freq: Three times a day (TID) | ORAL | Status: DC
  Administered 2015-10-27 – 2015-10-29 (×6): 500 mg via ORAL
  Filled 2015-10-27 (×6): qty 1

## 2015-10-27 MED ORDER — CIPROFLOXACIN HCL 500 MG PO TABS
500.0000 mg | ORAL_TABLET | Freq: Two times a day (BID) | ORAL | Status: DC
Start: 2015-10-27 — End: 2015-10-29
  Administered 2015-10-27 – 2015-10-29 (×5): 500 mg via ORAL
  Filled 2015-10-27 (×5): qty 1

## 2015-10-27 NOTE — Progress Notes (Signed)
ANTICOAGULATION CONSULT NOTE - Follow Up  Pharmacy Consult for Heparin (Xarelto on hold) Indication: AFib, H/O DVT  Allergies  Allergen Reactions  . Statins Other (See Comments)    Reaction:  Muscle pain   . Amlodipine Besylate Other (See Comments)    Reaction:  Muscle pain   . Cephalexin Hives    Patient Measurements: Height: 5\' 9"  (175.3 cm) Weight: 287 lb 14.7 oz (130.6 kg) IBW/kg (Calculated) : 70.7 Heparin Dosing Weight: 100 kg  Vital Signs: Temp: 98.7 F (37.1 C) (07/21 1442) Temp Source: Oral (07/21 1442) BP: 116/91 mmHg (07/21 1442) Pulse Rate: 66 (07/21 1442)  Labs:  Recent Labs  10/25/15 0332 10/26/15 0430 10/27/15 0437 10/27/15 1410 10/27/15 2002  HGB 13.3 14.5 14.1  --   --   HCT 38.9* 42.7 41.3  --   --   PLT 184 189 185  --   --   HEPARINUNFRC 0.37 0.39 0.24* 0.42 0.50  CREATININE 1.24 1.04 1.08  --   --     Estimated Creatinine Clearance: 87.7 mL/min (by C-G formula based on Cr of 1.08).   Medications:  Scheduled:  . aspirin EC  81 mg Oral Daily  . ciprofloxacin  500 mg Oral BID  . ezetimibe  10 mg Oral Daily  . insulin aspart  0-15 Units Subcutaneous TID WC  . insulin aspart  0-5 Units Subcutaneous QHS  . insulin aspart  3 Units Subcutaneous TID WC  . insulin glargine  25 Units Subcutaneous BID  . lisinopril  5 mg Oral Daily  . metoprolol succinate  75 mg Oral Daily  . metroNIDAZOLE  500 mg Oral Q8H  . rosuvastatin  20 mg Oral q1800   Infusions:  . heparin 1,800 Units/hr (10/27/15 0730)    Assessment:  70 yr male with significant PMH including MI, TIA, DM, HTN, AFib, DVT  Patient was on Xarelto 20mg  daily PTA but pt reports has not taken med within the past month due to inability to obtain medication.  Patient presents with abdominal pain, N/V.  CT Abdomen shows partial ileal obstruction and questionable enteritis.  Pharmacy consulted to dose IV heparin as bridge therapy with plans for colonoscopy 7/18 AM.  Heparin resumed 7/18  s/p colonoscopy without a bolus  Today, 10/27/2015  Heparin level remains therapeutic at 0.5 units/ml on heparin infusion at 1800 units/hr  CBC stable  No bleeding documented  No complications of therapy reported per nursing  Goal of Therapy:  Heparin level 0.3-0.7 units/ml Monitor platelets by anticoagulation protocol: Yes   Plan:   Continue heparin infusion at 1800 units/hr = 18 ml/hr.  Daily heparin level and CBC.  F/u plans for resuming oral anticoagulation when appropriate.   Greer Pickerel, PharmD, BCPS Pager: [phone] 10/27/2015 9:18 PM

## 2015-10-27 NOTE — Progress Notes (Signed)
Patient ID: Leroy [name], male   DOB: [birth year], 70 y.o.   MRN: [id number] 3 Days Post-Op  Subjective: Continues to improve. Feels like "1 million".  Occasional  Minimal right lower quadrant discomfort. No bowel movement yet but heading to the bathroom now.  Tolerating soft diet without nausea or other complaints.  Objective: Vital signs in last 24 hours: Temp:  [98.2 F (36.8 C)-98.7 F (37.1 C)] 98.7 F (37.1 C) (07/21 0515) Pulse Rate:  [73-79] 74 (07/21 0515) Resp:  [18-20] 20 (07/21 0515) BP: (112-143)/(49-87) 112/69 mmHg (07/21 0515) SpO2:  [97 %-100 %] 97 % (07/21 0515) Last BM Date: 10/26/15  Intake/Output from previous day: 07/20 0701 - 07/21 0700 In: 480 [P.O.:480] Out: 800 [Urine:800] Intake/Output this shift: Total I/O In: 240 [P.O.:240] Out: 200 [Urine:200]  General appearance: alert, cooperative, no distress and morbidly obese GI: normal findings: soft, non-tender  Lab Results:   Recent Labs  10/26/15 0430 10/27/15 0437  WBC 13.8* 9.8  HGB 14.5 14.1  HCT 42.7 41.3  PLT 189 185   BMET  Recent Labs  10/26/15 0430 10/27/15 0437  NA 134* 137  K 3.8 3.6  CL 106 106  CO2 20* 25  GLUCOSE 164* 114*  BUN 12 12  CREATININE 1.04 1.08  CALCIUM 8.0* 7.9*     Studies/Results: Dg Abd 2 Views  November 05, 2015  CLINICAL DATA:  Continued abdominal pain and distention. EXAM: ABDOMEN - 2 VIEW COMPARISON:  10/23/2015 FINDINGS: Right-side-up decubitus film shows no evidence for intraperitoneal free air. Supine film shows diffuse gaseous distention of large and small bowel, slightly progressive in the interval. IMPRESSION: Worsening gaseous distention of small bowel and colon suggests evolving ileus. Electronically Signed   By: Kennith Center M.D.   On: 11/05/2015 14:29    Anti-infectives: Anti-infectives    Start     Dose/Rate Route Frequency Ordered Stop   10/27/15 1400  metroNIDAZOLE (FLAGYL) tablet 500 mg     500 mg Oral Every 8 hours 10/27/15 0822 11/03/15  1359   10/27/15 1000  ciprofloxacin (CIPRO) tablet 500 mg     500 mg Oral 2 times daily 10/27/15 4388 11/03/15 0759   10/23/15 1200  metroNIDAZOLE (FLAGYL) IVPB 500 mg  Status:  Discontinued     500 mg 100 mL/hr over 60 Minutes Intravenous Every 8 hours 10/23/15 1034 10/27/15 0822   10/23/15 1100  ciprofloxacin (CIPRO) IVPB 400 mg  Status:  Discontinued     400 mg 200 mL/hr over 60 Minutes Intravenous 2 times daily 10/23/15 1034 10/27/15 8757      Assessment/Plan: s/p Procedure(s): COLONOSCOPY WITH PROPOFOL Ischemic enteritis with biopsy at time of colonoscopy showing  Ischemic changes in terminal ileum.  Clinically steadily improving and now nontender and white blood count normal. It does notappear he will need surgical intervention. I would continue antibiotics for another 48 hours or so as his white count has normalized.   LOS: 4 days    Willie Plain T 10/27/2015

## 2015-10-27 NOTE — Progress Notes (Signed)
[facility] Progress Note    Since last GI note: Tolerated soft diet yesterday without nausea/vomiting.  + 2-3 soft BMs.  The RLQ pain is "mighty little" now, much better than when he presented.   Objective: Vital signs in last 24 hours: Temp:  [98.2 F (36.8 C)-98.7 F (37.1 C)] 98.7 F (37.1 C) (07/21 0515) Pulse Rate:  [73-79] 74 (07/21 0515) Resp:  [18-20] 20 (07/21 0515) BP: (112-143)/(49-87) 112/69 mmHg (07/21 0515) SpO2:  [97 %-100 %] 97 % (07/21 0515) Last BM Date: 10/26/15 General: alert and oriented times 3 Heart: regular rate and rythm Abdomen: soft, non-tender, non-distended, normal bowel sounds   Lab Results:  Recent Labs  Nov 13, 2015 0332 10/26/15 0430 10/27/15 0437  WBC 16.2* 13.8* 9.8  HGB 13.3 14.5 14.1  PLT 184 189 185  MCV 88.8 89.5 88.4    Recent Labs  Nov 13, 2015 0332 10/26/15 0430 10/27/15 0437  NA 135 134* 137  K 3.2* 3.8 3.6  CL 106 106 106  CO2 22 20* 25  GLUCOSE 95 164* 114*  BUN 18 12 12   CREATININE 1.24 1.04 1.08  CALCIUM 7.5* 8.0* 7.9*   Studies/Results: Dg Abd 2 Views  11-13-2015  CLINICAL DATA:  Continued abdominal pain and distention. EXAM: ABDOMEN - 2 VIEW COMPARISON:  10/23/2015 FINDINGS: Right-side-up decubitus film shows no evidence for intraperitoneal free air. Supine film shows diffuse gaseous distention of large and small bowel, slightly progressive in the interval. IMPRESSION: Worsening gaseous distention of small bowel and colon suggests evolving ileus. Electronically Signed   By: Kennith Center M.D.   On: 11-13-15 14:29     Medications: Scheduled Meds: . aspirin EC  81 mg Oral Daily  . ciprofloxacin  400 mg Intravenous BID  . ezetimibe  10 mg Oral Daily  . insulin aspart  0-15 Units Subcutaneous TID WC  . insulin aspart  0-5 Units Subcutaneous QHS  . insulin aspart  3 Units Subcutaneous TID WC  . insulin glargine  25 Units Subcutaneous BID  . lisinopril  5 mg Oral Daily  . metoprolol succinate  75 mg  Oral Daily  . metronidazole  500 mg Intravenous Q8H  . rosuvastatin  20 mg Oral q1800   Continuous Infusions: . heparin     PRN Meds:.hydrALAZINE, metoprolol, morphine injection, ondansetron (ZOFRAN) IV    Assessment/Plan: 70 y.o. male with ischemic insult to distal small bowel, cecum  Clinically improving.  I am going to change to oral antibiotics today and I think he should complete another 7 day course (cipro/flagyl).  I will advance his diet to heart smart this morning.   He needs chronic anticoagulation and has had difficulty getting prescriptions for this as an outpatient. Hopefully that can all be straightened out so he has no more gaps in anticoagulation again as he did for the two months leading up to his acute illness.    Please call or page with any further questions or concerns.     Rachael Fee, MD  10/27/2015, 8:14 AM [facility] Pager [phone]

## 2015-10-27 NOTE — Evaluation (Signed)
Physical Therapy Evaluation Patient Details Name: LYNDA CAPISTRAN MRN: [id number] DOB: 1945/10/15 Today's Date: 10/27/2015   History of Present Illness  I69 yo male admitted 10/23/15 with abdominal pain and found to havechemic enteritis with biopsy at time of colonoscopy showing Ischemic changes in terminal ileum.  Clinical Impression  The patient describes himself as being very independent with  2 prostheses. The patient does not have them right now so unable to assess mobility OOB. The patient indicates that he should be able to return to his home at modified independent level.  Pt admitted with above diagnosis. Pt currently with functional limitations due to the deficits listed below (see PT Problem List). Pt will benefit from skilled PT to increase their independence and safety with mobility to allow discharge to the venue listed below.       Follow Up Recommendations No PT follow up    Equipment Recommendations  None recommended by PT    Recommendations for Other Services       Precautions / Restrictions Precautions Precautions: Fall Precaution Comments: bil BKA      Mobility  Bed Mobility Overal bed mobility: Modified Independent                Transfers                 General transfer comment: only sat on edge of bed as patient did not have prostheses  Ambulation/Gait                Stairs            Wheelchair Mobility    Modified Rankin (Stroke Patients Only)       Balance Overall balance assessment: Independent                                           Pertinent Vitals/Pain Pain Assessment: No/denies pain    Home Living Family/patient expects to be discharged to:: Private residence Living Arrangements: Alone Available Help at Discharge: Available PRN/intermittently;Friend(s) Type of Home: House Home Access: Ramped entrance     Home Layout: One level Home Equipment: Environmental consultant - 2 wheels;Shower  seat;Wheelchair - power      Prior Function Level of Independence: Independent with assistive device(s)         Comments: Uses bilateral prostheses,electric w/c and walker to get around, independent in transfers, works IT trainer, drives Engineer, water Dominance        Extremity/Trunk Assessment   Upper Extremity Assessment: Overall WFL for tasks assessed           Lower Extremity Assessment: RLE deficits/detail;LLE deficits/detail RLE Deficits / Details: residual BKA LLE Deficits / Details: residual BKA     Communication   Communication: No difficulties  Cognition Arousal/Alertness: Awake/alert Behavior During Therapy: WFL for tasks assessed/performed Overall Cognitive Status: Within Functional Limits for tasks assessed                      General Comments      Exercises        Assessment/Plan    PT Assessment    PT Diagnosis Difficulty walking;Generalized weakness   PT Problem List    PT Treatment Interventions     PT Goals (Current goals can be found in the Care Plan section) Acute Rehab PT Goals Patient Stated Goal: to  go home PT Goal Formulation: With patient Time For Goal Achievement: 11/10/15 Potential to Achieve Goals: Good    Frequency     Barriers to discharge        Co-evaluation               End of Session   Activity Tolerance: Patient tolerated treatment well Patient left: in bed;with call bell/phone within reach;with bed alarm set Nurse Communication: Mobility status         Time: 1914-7829 PT Time Calculation (min) (ACUTE ONLY): 14 min   Charges:   PT Evaluation $PT Eval Low Complexity: 1 Procedure     PT G CodesRada Hay 10/27/2015, 12:18 PM Blanchard Kelch PT [phone]

## 2015-10-27 NOTE — Progress Notes (Signed)
Results for FITZROY, HELMICK (MRN [id number]) as of 10/27/2015 13:04  Ref. Range 10/26/2015 14:00 10/26/2015 18:20 10/26/2015 21:35 10/27/2015 08:11 10/27/2015 12:08  Glucose-Capillary Latest Ref Range: 65-99 mg/dL 567 (H) 014 (H) 103 (H) 170 (H) 274 (H)   Post-prandial blood sugars elevated. Would benefit from increase in meal coverage insulin.  Recommendations: Increase Novolog to 6 units tidwc if pt eats > 50% meal.  Will continue to follow. Thank you. Ailene Ards, RD, LDN, CDE Inpatient Diabetes Coordinator [phone]

## 2015-10-27 NOTE — Progress Notes (Signed)
ANTICOAGULATION CONSULT NOTE - Follow Up  Pharmacy Consult for Heparin (Xarelto on hold) Indication: AFib, H/O DVT  Allergies  Allergen Reactions  . Statins Other (See Comments)    Reaction:  Muscle pain   . Amlodipine Besylate Other (See Comments)    Reaction:  Muscle pain   . Cephalexin Hives    Patient Measurements: Height: 5\' 9"  (175.3 cm) Weight: 287 lb 14.7 oz (130.6 kg) IBW/kg (Calculated) : 70.7 Heparin Dosing Weight: 100 kg  Vital Signs: Temp: 98.7 F (37.1 C) (07/21 0515) Temp Source: Oral (07/21 0515) BP: 112/69 mmHg (07/21 0515) Pulse Rate: 74 (07/21 0515)  Labs:  Recent Labs  10/25/15 0332 10/26/15 0430 10/27/15 0437  HGB 13.3 14.5 14.1  HCT 38.9* 42.7 41.3  PLT 184 189 185  HEPARINUNFRC 0.37 0.39 0.24*  CREATININE 1.24 1.04 1.08    Estimated Creatinine Clearance: 87.7 mL/min (by C-G formula based on Cr of 1.08).   Medications:  Scheduled:  . aspirin EC  81 mg Oral Daily  . ciprofloxacin  400 mg Intravenous BID  . ezetimibe  10 mg Oral Daily  . insulin aspart  0-15 Units Subcutaneous TID WC  . insulin aspart  0-5 Units Subcutaneous QHS  . insulin aspart  3 Units Subcutaneous TID WC  . insulin glargine  25 Units Subcutaneous BID  . lisinopril  5 mg Oral Daily  . metoprolol succinate  75 mg Oral Daily  . metronidazole  500 mg Intravenous Q8H  . rosuvastatin  20 mg Oral q1800   Infusions:  . heparin 1,600 Units/hr (10/26/15 5053)    Assessment:  70 yr male with significant PMH including MI, TIA, DM, HTN, AFib, DVT  Patient was on Xarelto 20mg  daily PTA but pt reports has not taken med within the past month due to inability to obtain medication.  Patient presents with abdominal pain, N/V.  CT Abdomen shows partial ileal obstruction and questionable enteritis.  Pharmacy consulted to dose IV heparin as bridge therapy with plans for colonoscopy 7/18 AM.  Heparin resumed 7/18 s/p colonoscopy without a bolus  Today, 10/27/2015  Heparin  level dropped down to 0.24 this morning, now subtherapeutic on 1600 units/hr  Cbc stable  No bleeding documented  Goal of Therapy:  Heparin level 0.3-0.7 units/ml Monitor platelets by anticoagulation protocol: Yes   Plan:   Increase to heparin level 1800 units/hr = 18 ml/hr  Check HL in 6 hours after rate change  Daily heparin level and CBC  F/u plans for resuming oral anticoagulation when appropriate

## 2015-10-27 NOTE — Progress Notes (Signed)
ANTICOAGULATION CONSULT NOTE - Follow Up  Pharmacy Consult for Heparin (Xarelto on hold) Indication: AFib, H/O DVT  Allergies  Allergen Reactions  . Statins Other (See Comments)    Reaction:  Muscle pain   . Amlodipine Besylate Other (See Comments)    Reaction:  Muscle pain   . Cephalexin Hives    Patient Measurements: Height: 5\' 9"  (175.3 cm) Weight: 287 lb 14.7 oz (130.6 kg) IBW/kg (Calculated) : 70.7 Heparin Dosing Weight: 100 kg  Vital Signs: Temp: 98.7 F (37.1 C) (07/21 1442) Temp Source: Oral (07/21 1442) BP: 116/91 mmHg (07/21 1442) Pulse Rate: 66 (07/21 1442)  Labs:  Recent Labs  10/25/15 0332 10/26/15 0430 10/27/15 0437 10/27/15 1410  HGB 13.3 14.5 14.1  --   HCT 38.9* 42.7 41.3  --   PLT 184 189 185  --   HEPARINUNFRC 0.37 0.39 0.24* 0.42  CREATININE 1.24 1.04 1.08  --     Estimated Creatinine Clearance: 87.7 mL/min (by C-G formula based on Cr of 1.08).   Medications:  Scheduled:  . aspirin EC  81 mg Oral Daily  . ciprofloxacin  500 mg Oral BID  . ezetimibe  10 mg Oral Daily  . insulin aspart  0-15 Units Subcutaneous TID WC  . insulin aspart  0-5 Units Subcutaneous QHS  . insulin aspart  3 Units Subcutaneous TID WC  . insulin glargine  25 Units Subcutaneous BID  . lisinopril  5 mg Oral Daily  . metoprolol succinate  75 mg Oral Daily  . metroNIDAZOLE  500 mg Oral Q8H  . rosuvastatin  20 mg Oral q1800   Infusions:  . heparin 1,800 Units/hr (10/27/15 0730)    Assessment:  70 yr male with significant PMH including MI, TIA, DM, HTN, AFib, DVT  Patient was on Xarelto 20mg  daily PTA but pt reports has not taken med within the past month due to inability to obtain medication.  Patient presents with abdominal pain, N/V.  CT Abdomen shows partial ileal obstruction and questionable enteritis.  Pharmacy consulted to dose IV heparin as bridge therapy with plans for colonoscopy 7/18 AM.  Heparin resumed 7/18 s/p colonoscopy without a  bolus  Today, 10/27/2015  Heparin level now therapeutic at 0.42 units/ml on heparin infusion at 1800 units/hr  CBC stable  No bleeding documented  No complications of therapy reported  Goal of Therapy:  Heparin level 0.3-0.7 units/ml Monitor platelets by anticoagulation protocol: Yes   Plan:   Continue heparin infusion at 1800 units/hr = 18 ml/hr  Check confirmatory heparin level 6 hours after last heparin level.  Daily heparin level and CBC.  F/u plans for resuming oral anticoagulation when appropriate.   Greer Pickerel, PharmD, BCPS Pager: [phone] 10/27/2015 3:19 PM

## 2015-10-27 NOTE — Care Management Note (Signed)
Case Management Note  Patient Details  Name: Leroy [name] MRN: [id number] Date of Birth: 1945/09/07  Subjective/Objective:      70 yo admitted with RLQ abdominal pain.              Action/Plan: Pt from home alone and uses a prosthesis. Pt also has a walker;Shower seat;and power wheelchair. Per PT evaluation no PT follow up is needed at home.  Per [place] from pharmacy. Pt will receive Xeralto teaching prior to discharge and pharmacy will also provide pt with a free 30 trial card for the [facility]. No other CM needs communicated. CM will continue to follow.  Expected Discharge Date:   (UNKNOWN)               Expected Discharge Plan:  Home/Self Care  In-House Referral:     Discharge planning Services  CM Consult  Post Acute Care Choice:    Choice offered to:     DME Arranged:    DME Agency:     HH Arranged:    HH Agency:     Status of Service:  In process, will continue to follow  If discussed at Long Length of Stay Meetings, dates discussed:    Additional CommentsBartholome Bill, RN 10/27/2015, 1:14 PM  [phone]

## 2015-10-27 NOTE — Progress Notes (Signed)
[name]    Progress Note  DAMONTA [name]  ZOX:[id number] DOB: 02-25-1946 DOA: 10/22/2015 PCP: [name], MD     Brief Narrative:   Leroy [name] is an 70 y.o. male past medical history of peripheral vascular disease MI TIA bilateral below the knee amputation diabetes type 2 poorly controlled last A1c of 11.5 guiding 2016, came into the ED complaining with abdominal pain, CT scan of the abdomen was done that showed inflammation of the terminal ileum colonoscopy was performed with results below.  Assessment/Plan:   RLQ abdominal pain Due to ischemic bowel disease: Colonoscopy on 10/24/2015 that showed severe circumferential inflammation and ulceration of the distal ileum and cecum biopsies performed,  Biospy was consistent with ischemic colitis. Appreciate specialist assistance, she still high risk for perforation. Tolerating soft diet, having regular BM.  Paroxysmal atrial fibrillation: Rate controlled, continue beta blockers and heparin.  Diabetes mellitus type 2 with peripheral artery disease (HCC) Continue long-acting insulin plus sliding-scale insulin.  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 Seems to be compensated currently on a beta blocker. Acute renal failure resolved, lisinopril has been started her pressure improved.  CAD: Chest pain-free on aspirin and beta blockers currently asymptomatic.  Peripheral arterial disease with bilateral BKA's: No wounds continue supportive care.  Acute kidney injury: Resolved. Likely pre-renal.   DVT prophylaxis: heparin Family Communication:none Disposition Plan/Barrier to D/C: 24 hrs after transitioning to oral anticoagulation. Code Status:     Code Status Orders        Start     Ordered   10/23/15 0258  Full code   Continuous     10/23/15 0305    Code Status History    Date Active Date Inactive Code Status Order ID Comments User Context    05/22/2015  9:24 PM 05/24/2015  6:25 PM Full Code [id number]  Eduard Clos, MD Inpatient   04/20/2014  2:22 PM 04/22/2014  9:42 PM Full Code [id number]  Raymond Gurney, PA-C Inpatient   04/21/2013  6:50 PM 05/07/2013  5:50 PM Full Code [id number]  Charlton Amor, PA-C Inpatient   04/12/2013  7:53 PM 04/21/2013  6:50 PM Full Code [id number]  [name], NP Inpatient   03/25/2013  2:59 PM 03/28/2013  1:47 PM Full Code [id number]  [name], PA-C Inpatient   06/03/2012  4:37 PM 06/11/2012  7:07 PM Full Code [id number]  [name], MD Inpatient        IV Access:    Peripheral IV   Procedures and diagnostic studies:   Dg Abd 2 Views  10/25/2015  CLINICAL DATA:  Continued abdominal pain and distention. EXAM: ABDOMEN - 2 VIEW COMPARISON:  10/23/2015 FINDINGS: Right-side-up decubitus film shows no evidence for intraperitoneal free air. Supine film shows diffuse gaseous distention of large and small bowel, slightly progressive in the interval. IMPRESSION: Worsening gaseous distention of small bowel and colon suggests evolving ileus. Electronically Signed   By: Kennith Center M.D.   On: 10/25/2015 14:29     Medical Consultants:    None.  Anti-Infectives:   Ciprofloxacin and Flagyl  Subjective:    [name] cont to have some abd pain.  Objective:    Filed Vitals:   10/26/15 0528 10/26/15 1421 10/26/15 2137 10/27/15 0515  BP: 122/78 116/87 143/49 112/69  Pulse: 81 73 79 74  Temp: 98.6 F (37 C) 98.2 F (36.8 C) 98.3 F (36.8 C)  98.7 F (37.1 C)  TempSrc: Oral Oral Oral Oral  Resp: 20 18 20 20   Height:      Weight:      SpO2: 98% 100% 97% 97%    Intake/Output Summary (Last 24 hours) at 10/27/15 0721 Last data filed at 10/26/15 1900  Gross per 24 hour  Intake    480 ml  Output    800 ml  Net   -320 ml   Filed Weights   10/23/15 0529 10/25/15 0500  Weight: 128.1 kg (282 lb 6.6 oz) 130.6 kg (287 lb 14.7 oz)    Exam: General exam: In no acute  distress. Respiratory system: Good air movement and clear to auscultation. Cardiovascular system: S1 & S2 heard, RRR. No JVD. Gastrointestinal system: Abdomen is nondistended, soft and nontender.  Central nervous system: Alert and oriented. No focal neurological deficits. Extremities: Below-the-knee amputation bilaterally. Skin: No rashes, lesions or ulcers Psychiatry: Judgement and insight appear normal. Mood & affect appropriate.    Data Reviewed:    Labs: Basic Metabolic Panel:  Recent Labs Lab 10/23/15 0444 10/24/15 0100 10/24/15 0101 10/25/15 0332 10/26/15 0430 10/27/15 0437  NA 134* 136  --  135 134* 137  K 4.8 4.0  --  3.2* 3.8 3.6  CL 101 102  --  106 106 106  CO2 23 23  --  22 20* 25  GLUCOSE 314* 146*  --  95 164* 114*  BUN 16 22*  --  18 12 12   CREATININE 1.44* 1.67*  --  1.24 1.04 1.08  CALCIUM 8.3* 8.4*  --  7.5* 8.0* 7.9*  MG  --   --  1.9  --   --   --    GFR Estimated Creatinine Clearance: 87.7 mL/min (by C-G formula based on Cr of 1.08). Liver Function Tests: No results for input(s): AST, ALT, ALKPHOS, BILITOT, PROT, ALBUMIN in the last 168 hours.  Recent Labs Lab 10/22/15 2046  LIPASE 14   No results for input(s): AMMONIA in the last 168 hours. Coagulation profile No results for input(s): INR, PROTIME in the last 168 hours.  CBC:  Recent Labs Lab 10/22/15 1826 10/24/15 0100 10/25/15 0332 10/26/15 0430 10/27/15 0437  WBC 15.1* 16.4* 16.2* 13.8* 9.8  HGB 17.0 15.6 13.3 14.5 14.1  HCT 48.2 45.9 38.9* 42.7 41.3  MCV 87.3 90.2 88.8 89.5 88.4  PLT 184 171 184 189 185   Cardiac Enzymes: No results for input(s): CKTOTAL, CKMB, CKMBINDEX, TROPONINI in the last 168 hours. BNP (last 3 results) No results for input(s): PROBNP in the last 8760 hours. CBG:  Recent Labs Lab 10/25/15 2152 10/26/15 0836 10/26/15 1400 10/26/15 1820 10/26/15 2135  GLUCAP 178* 178* 169* 232* 164*   D-Dimer: No results for input(s): DDIMER in the last 72  hours. Hgb A1c: No results for input(s): HGBA1C in the last 72 hours. Lipid Profile: No results for input(s): CHOL, HDL, LDLCALC, TRIG, CHOLHDL, LDLDIRECT in the last 72 hours. Thyroid function studies: No results for input(s): TSH, T4TOTAL, T3FREE, THYROIDAB in the last 72 hours.  Invalid input(s): FREET3 Anemia work up: No results for input(s): VITAMINB12, FOLATE, FERRITIN, TIBC, IRON, RETICCTPCT in the last 72 hours. Sepsis Labs:  Recent Labs Lab 10/23/15 0011 10/23/15 0316 10/23/15 0548 10/24/15 0100 10/25/15 0332 10/26/15 0430 10/27/15 0437  WBC  --   --   --  16.4* 16.2* 13.8* 9.8  LATICACIDVEN 2.40* 2.62* 1.9  --   --   --   --  Microbiology Recent Results (from the past 240 hour(s))  MRSA PCR Screening     Status: None   Collection Time: 10/23/15  5:38 AM  Result Value Ref Range Status   MRSA by PCR NEGATIVE NEGATIVE Final    Comment:        The GeneXpert MRSA Assay (FDA approved for NASAL specimens only), is one component of a comprehensive MRSA colonization surveillance program. It is not intended to diagnose MRSA infection nor to guide or monitor treatment for MRSA infections.      Medications:   . aspirin EC  81 mg Oral Daily  . ciprofloxacin  400 mg Intravenous BID  . ezetimibe  10 mg Oral Daily  . insulin aspart  0-15 Units Subcutaneous TID WC  . insulin aspart  0-5 Units Subcutaneous QHS  . insulin aspart  3 Units Subcutaneous TID WC  . insulin glargine  25 Units Subcutaneous BID  . lisinopril  5 mg Oral Daily  . metoprolol succinate  75 mg Oral Daily  . metronidazole  500 mg Intravenous Q8H  . rosuvastatin  20 mg Oral q1800   Continuous Infusions: . heparin      Time spent: 15 min   LOS: 4 days   Marinda Elk  [facility] Pager [phone]  *Please refer to [email or web address], password [email or web address] to get updated schedule on who will round on this patient, as hospitalists switch teams weekly. If 7PM-7AM, please contact  night-coverage at [email or web address], password [email or web address] for any overnight needs.  10/27/2015, 7:21 AM

## 2015-10-28 LAB — CBC
HCT: 40.8 % (ref 39.0–52.0)
Hemoglobin: 13.7 g/dL (ref 13.0–17.0)
MCH: 30 pg (ref 26.0–34.0)
MCHC: 33.6 g/dL (ref 30.0–36.0)
MCV: 89.3 fL (ref 78.0–100.0)
PLATELETS: 211 10*3/uL (ref 150–400)
RBC: 4.57 MIL/uL (ref 4.22–5.81)
RDW: 13.2 % (ref 11.5–15.5)
WBC: 9.9 10*3/uL (ref 4.0–10.5)

## 2015-10-28 LAB — GLUCOSE, CAPILLARY
Glucose-Capillary: 213 mg/dL — ABNORMAL HIGH (ref 65–99)
Glucose-Capillary: 216 mg/dL — ABNORMAL HIGH (ref 65–99)
Glucose-Capillary: 198 mg/dL — ABNORMAL HIGH (ref 65–99)
Glucose-Capillary: 134 mg/dL — ABNORMAL HIGH (ref 65–99)

## 2015-10-28 LAB — BASIC METABOLIC PANEL
ANION GAP: 6 (ref 5–15)
BUN: 13 mg/dL (ref 6–20)
CALCIUM: 7.9 mg/dL — AB (ref 8.9–10.3)
CO2: 23 mmol/L (ref 22–32)
CREATININE: 1.18 mg/dL (ref 0.61–1.24)
Chloride: 107 mmol/L (ref 101–111)
GFR calc Af Amer: >60 mL/min (ref >60)
GLUCOSE: 226 mg/dL — AB (ref 65–99)
Potassium: 3.8 mmol/L (ref 3.5–5.1)
Sodium: 136 mmol/L (ref 135–145)

## 2015-10-28 LAB — HEPARIN LEVEL (UNFRACTIONATED): HEPARIN UNFRACTIONATED: 0.61 [IU]/mL (ref 0.30–0.70)

## 2015-10-28 MED ORDER — HEPARIN (PORCINE) IN NACL 100-0.45 UNIT/ML-% IJ SOLN
1800.0000 [IU]/h | INTRAMUSCULAR | Status: DC
  Administered 2015-10-28: 1800 [IU]/h via INTRAVENOUS
  Filled 2015-10-28: qty 250

## 2015-10-28 MED ORDER — RIVAROXABAN 20 MG PO TABS
20.0000 mg | ORAL_TABLET | Freq: Every day | ORAL | Status: DC
  Administered 2015-10-28: 20 mg via ORAL
  Filled 2015-10-28: qty 1

## 2015-10-28 MED ORDER — CANAGLIFLOZIN-METFORMIN HCL 50-1000 MG PO TABS: 1.0000 | ORAL_TABLET | Freq: Two times a day (BID) | ORAL | Status: DC

## 2015-10-28 MED ORDER — METFORMIN HCL 500 MG PO TABS
1000.0000 mg | ORAL_TABLET | Freq: Two times a day (BID) | ORAL | Status: DC
  Administered 2015-10-28 – 2015-10-29 (×2): 1000 mg via ORAL
  Filled 2015-10-28 (×2): qty 2

## 2015-10-28 MED ORDER — CANAGLIFLOZIN 100 MG PO TABS
100.0000 mg | ORAL_TABLET | Freq: Every day | ORAL | Status: DC
  Administered 2015-10-28 – 2015-10-29 (×2): 100 mg via ORAL
  Filled 2015-10-28 (×3): qty 1

## 2015-10-28 NOTE — Progress Notes (Signed)
ANTICOAGULATION CONSULT NOTE - Follow Up  Pharmacy Consult for Heparin (Xarelto on hold) Indication: AFib, H/O DVT  Allergies  Allergen Reactions  . Statins Other (See Comments)    Reaction:  Muscle pain   . Amlodipine Besylate Other (See Comments)    Reaction:  Muscle pain   . Cephalexin Hives    Patient Measurements: Height:  (175.3 cm) Weight: 287 lb 14.7 oz (130.6 kg) IBW/kg (Calculated) : 70.7 Heparin Dosing Weight: 100 kg  Vital Signs: Temp: 98.1 F (36.7 C) (07/22 0443) Temp Source: Oral (07/22 0443) BP: 131/65 mmHg (07/22 0443) Pulse Rate: 73 (07/22 0443)  Labs:  Recent Labs  10/26/15 0430 10/27/15 0437 10/27/15 1410 10/27/15 2002 10/28/15 0510  HGB 14.5 14.1  --   --  13.7  HCT 42.7 41.3  --   --  40.8  PLT 189 185  --   --  211  HEPARINUNFRC 0.39 0.24* 0.42 0.50 0.61  CREATININE 1.04 1.08  --   --  1.18    Estimated Creatinine Clearance: 80.3 mL/min (by C-G formula based on Cr of 1.18).   Medications:  Scheduled:  . aspirin EC  81 mg Oral Daily  . ciprofloxacin  500 mg Oral BID  . ezetimibe  10 mg Oral Daily  . insulin aspart  0-15 Units Subcutaneous TID WC  . insulin aspart  0-5 Units Subcutaneous QHS  . insulin aspart  3 Units Subcutaneous TID WC  . insulin glargine  25 Units Subcutaneous BID  . lisinopril  5 mg Oral Daily  . metoprolol succinate  75 mg Oral Daily  . metroNIDAZOLE  500 mg Oral Q8H  . rosuvastatin  20 mg Oral q1800   Infusions:  . heparin 1,800 Units/hr (10/28/15 0448)    Assessment:  70 yr male with significant PMH including MI, TIA, DM, HTN, AFib, DVT  Patient was on Xarelto  daily PTA but pt reports has not taken med within the past month due to inability to obtain medication.  Patient presents with abdominal pain, N/V.  CT Abdomen shows partial ileal obstruction and questionable enteritis.  Pharmacy consulted to dose IV heparin as bridge therapy with plans for colonoscopy 7/18 AM.  Heparin resumed 7/18  s/p colonoscopy without a bolus  Today, 10/28/2015  Heparin level remains therapeutic at 0.61 on 1800 units/hr but trending up after rate increase yesterday  CBC stable  No bleeding documented  Goal of Therapy:  Heparin level 0.3-0.7 units/ml Monitor platelets by anticoagulation protocol: Yes   Plan:   Decrease heparin infusion slightly to 1700 units/hr to prevent further increase.  Daily heparin level and CBC.  F/u plans for resuming oral anticoagulation when appropriate.   Loralee Pacas, PharmD, BCPS Pager: [phone] 10/28/2015 7:22 AM

## 2015-10-28 NOTE — Progress Notes (Addendum)
[name]    Progress Note  TIRAS THREATS  UGA:[id number] DOB: 06-22-1945 DOA: 10/22/2015 PCP: [name], MD     Brief Narrative:   Leroy [name] is an 70 y.o. male past medical history of peripheral vascular disease MI TIA bilateral below the knee amputation diabetes type 2 poorly controlled last A1c of 11.5 guiding 2016, came into the ED complaining with abdominal pain, CT scan of the abdomen was done that showed inflammation of the terminal ileum colonoscopy was performed with results below.  Assessment/Plan:   RLQ abdominal pain Due to ischemic bowel disease: Colonoscopy on 10/24/2015 that showed severe circumferential inflammation and ulceration of the distal ileum and cecum biopsies performed,  Biospy was consistent with ischemic colitis. Change antibiotics to oral, change heparin to xarelto  Paroxysmal atrial fibrillation: Rate controlled, continue beta blockers, d/c heparin start xarelto.  Diabetes mellitus type 2 with peripheral artery disease (HCC) Continue long-acting insulin plus sliding-scale insulin. Resume oral agents.  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 Seems to be compensated currently on a beta blocker. Acute renal failure resolved, lisinopril has been started her pressure improved.  CAD: Chest pain-free on aspirin and beta blockers currently asymptomatic.  Peripheral arterial disease with bilateral BKA's: No wounds continue supportive care.  Acute kidney injury: Resolved. Likely pre-renal.   DVT prophylaxis: heparin Family Communication:none Disposition Plan/Barrier to D/C: home in am Code Status:     Code Status Orders        Start     Ordered   10/23/15 0258  Full code   Continuous     10/23/15 0305    Code Status History    Date Active Date Inactive Code Status Order ID Comments User Context   05/22/2015  9:24 PM 05/24/2015  6:25 PM Full Code [id number]   Eduard Clos, MD Inpatient   04/20/2014  2:22 PM 04/22/2014  9:42 PM Full Code [id number]  Raymond Gurney, PA-C Inpatient   04/21/2013  6:50 PM 05/07/2013  5:50 PM Full Code [id number]  Charlton Amor, PA-C Inpatient   04/12/2013  7:53 PM 04/21/2013  6:50 PM Full Code [id number]  [name], NP Inpatient   03/25/2013  2:59 PM 03/28/2013  1:47 PM Full Code [id number]  [name], PA-C Inpatient   06/03/2012  4:37 PM 06/11/2012  7:07 PM Full Code [id number]  [name], MD Inpatient        IV Access:    Peripheral IV   Procedures and diagnostic studies:   No results found.   Medical Consultants:    None.  Anti-Infectives:   Ciprofloxacin and Flagyl  Subjective:    [name] no pain today.  Objective:    Filed Vitals:   10/27/15 0515 10/27/15 1442 10/27/15 2126 10/28/15 0443  BP: 112/69 116/91 122/52 131/65  Pulse: 74 66 73 73  Temp: 98.7 F (37.1 C) 98.7 F (37.1 C) 98.5 F (36.9 C) 98.1 F (36.7 C)  TempSrc: Oral Oral Oral Oral  Resp: 20 20 20 20   Height:      Weight:      SpO2: 97% 97% 98% 98%    Intake/Output Summary (Last 24 hours) at 10/28/15 0753 Last data filed at 10/27/15 2100  Gross per 24 hour  Intake    960 ml  Output    875 ml  Net     85 ml   [employer]  10/23/15 0529 10/25/15 0500  Weight: 128.1 kg (282 lb 6.6 oz) 130.6 kg (287 lb 14.7 oz)    Exam: General exam: In no acute distress. Cardiovascular system: S1 & S2 heard, RRR. No JVD. Gastrointestinal system: Abdomen is nondistended, soft and nontender.  Central nervous system: Alert and oriented. No focal neurological deficits. Extremities: Below-the-knee amputation bilaterally. Skin: No rashes, lesions or ulcers   Data Reviewed:    Labs: Basic Metabolic Panel:  Recent Labs Lab 10/24/15 0100 10/24/15 0101 10/25/15 0332 10/26/15 0430 10/27/15 0437 10/28/15 0510  NA 136  --  135 134* 137 136  K 4.0  --  3.2* 3.8 3.6 3.8  CL 102  --  106 106 106 107    CO2 23  --  22 20* 25 23  GLUCOSE 146*  --  95 164* 114* 226*  BUN 22*  --  18 12 12 13   CREATININE 1.67*  --  1.24 1.04 1.08 1.18  CALCIUM 8.4*  --  7.5* 8.0* 7.9* 7.9*  MG  --  1.9  --   --   --   --    GFR Estimated Creatinine Clearance: 80.3 mL/min (by C-G formula based on Cr of 1.18). Liver Function Tests: No results for input(s): AST, ALT, ALKPHOS, BILITOT, PROT, ALBUMIN in the last 168 hours.  Recent Labs Lab 10/22/15 2046  LIPASE 14   No results for input(s): AMMONIA in the last 168 hours. Coagulation profile No results for input(s): INR, PROTIME in the last 168 hours.  CBC:  Recent Labs Lab 10/24/15 0100 10/25/15 0332 10/26/15 0430 10/27/15 0437 10/28/15 0510  WBC 16.4* 16.2* 13.8* 9.8 9.9  HGB 15.6 13.3 14.5 14.1 13.7  HCT 45.9 38.9* 42.7 41.3 40.8  MCV 90.2 88.8 89.5 88.4 89.3  PLT 171 184 189 185 211   Cardiac Enzymes: No results for input(s): CKTOTAL, CKMB, CKMBINDEX, TROPONINI in the last 168 hours. BNP (last 3 results) No results for input(s): PROBNP in the last 8760 hours. CBG:  Recent Labs Lab 10/27/15 0811 10/27/15 1208 10/27/15 1740 10/27/15 2116 10/28/15 0746  GLUCAP 170* 274* 234* 229* 213*   D-Dimer: No results for input(s): DDIMER in the last 72 hours. Hgb A1c: No results for input(s): HGBA1C in the last 72 hours. Lipid Profile: No results for input(s): CHOL, HDL, LDLCALC, TRIG, CHOLHDL, LDLDIRECT in the last 72 hours. Thyroid function studies: No results for input(s): TSH, T4TOTAL, T3FREE, THYROIDAB in the last 72 hours.  Invalid input(s): FREET3 Anemia work up: No results for input(s): VITAMINB12, FOLATE, FERRITIN, TIBC, IRON, RETICCTPCT in the last 72 hours. Sepsis Labs:  Recent Labs Lab 10/23/15 0011 10/23/15 0316 10/23/15 0548  10/25/15 0332 10/26/15 0430 10/27/15 0437 10/28/15 0510  WBC  --   --   --   < > 16.2* 13.8* 9.8 9.9  LATICACIDVEN 2.40* 2.62* 1.9  --   --   --   --   --   < > = values in this interval  not displayed. Microbiology Recent Results (from the past 240 hour(s))  MRSA PCR Screening     Status: None   Collection Time: 10/23/15  5:38 AM  Result Value Ref Range Status   MRSA by PCR NEGATIVE NEGATIVE Final    Comment:        The GeneXpert MRSA Assay (FDA approved for NASAL specimens only), is one component of a comprehensive MRSA colonization surveillance program. It is not intended to diagnose MRSA infection nor to guide or monitor treatment  for MRSA infections.      Medications:   . aspirin EC  81 mg Oral Daily  . ciprofloxacin  500 mg Oral BID  . ezetimibe  10 mg Oral Daily  . insulin aspart  0-15 Units Subcutaneous TID WC  . insulin aspart  0-5 Units Subcutaneous QHS  . insulin aspart  3 Units Subcutaneous TID WC  . insulin glargine  25 Units Subcutaneous BID  . lisinopril  5 mg Oral Daily  . metoprolol succinate  75 mg Oral Daily  . metroNIDAZOLE  500 mg Oral Q8H  . rosuvastatin  20 mg Oral q1800   Continuous Infusions: . heparin 1,800 Units/hr (10/28/15 0448)    Time spent: 15 min   LOS: 5 days   Marinda Elk  [facility] Pager [phone]  *Please refer to [email or web address], password [email or web address] to get updated schedule on who will round on this patient, as hospitalists switch teams weekly. If 7PM-7AM, please contact night-coverage at [email or web address], password [email or web address] for any overnight needs.  10/28/2015, 7:53 AM

## 2015-10-28 NOTE — Progress Notes (Signed)
4 Days Post-Op  Subjective: Minimal RLQ pain.  Tolerating diet.  Objective: Vital signs in last 24 hours: Temp:  [98.1 F (36.7 C)-98.7 F (37.1 C)] 98.1 F (36.7 C) (07/22 0443) Pulse Rate:  [66-73] 73 (07/22 0443) Resp:  [20] 20 (07/22 0443) BP: (116-131)/(52-91) 131/65 mmHg (07/22 0443) SpO2:  [97 %-98 %] 98 % (07/22 0443) Last BM Date: 10/26/15  Intake/Output from previous day: 07/21 0701 - 07/22 0700 In: 960 [P.O.:960] Out: 875 [Urine:875] Intake/Output this shift: Total I/O In: -  Out: 400 [Urine:400]  PE: General- In NAD Abdomen-soft, no tenderness Lab Results:   Recent Labs  10/27/15 0437 10/28/15 0510  WBC 9.8 9.9  HGB 14.1 13.7  HCT 41.3 40.8  PLT 185 211   BMET  Recent Labs  10/27/15 0437 10/28/15 0510  NA 137 136  K 3.6 3.8  CL 106 107  CO2 25 23  GLUCOSE 114* 226*  BUN 12 13  CREATININE 1.08 1.18  CALCIUM 7.9* 7.9*   PT/INR No results for input(s): LABPROT, INR in the last 72 hours. Comprehensive Metabolic Panel:    Component Value Date/Time   NA 136 10/28/2015 0510   NA 137 10/27/2015 0437   K 3.8 10/28/2015 0510   K 3.6 10/27/2015 0437   CL 107 10/28/2015 0510   CL 106 10/27/2015 0437   CO2 23 10/28/2015 0510   CO2 25 10/27/2015 0437   BUN 13 10/28/2015 0510   BUN 12 10/27/2015 0437   CREATININE 1.18 10/28/2015 0510   CREATININE 1.08 10/27/2015 0437   GLUCOSE 226* 10/28/2015 0510   GLUCOSE 114* 10/27/2015 0437   CALCIUM 7.9* 10/28/2015 0510   CALCIUM 7.9* 10/27/2015 0437   AST 16 05/23/2015 0528   AST 17 05/22/2015 1800   ALT 16* 05/23/2015 0528   ALT 19 05/22/2015 1800   ALKPHOS 78 05/23/2015 0528   ALKPHOS 84 05/22/2015 1800   BILITOT 0.4 05/23/2015 0528   BILITOT 0.3 05/22/2015 1800   PROT 6.2* 05/23/2015 0528   PROT 6.7 05/22/2015 1800   ALBUMIN 2.6* 05/23/2015 0528   ALBUMIN 3.1* 05/22/2015 1800     Studies/Results: No results found.  Anti-infectives: Anti-infectives    Start     Dose/Rate Route  Frequency Ordered Stop   10/27/15 1400  metroNIDAZOLE (FLAGYL) tablet 500 mg     500 mg Oral Every 8 hours 10/27/15 0822 11/03/15 1359   10/27/15 1000  ciprofloxacin (CIPRO) tablet 500 mg     500 mg Oral 2 times daily 10/27/15 0822 11/03/15 0759   10/23/15 1200  metroNIDAZOLE (FLAGYL) IVPB 500 mg  Status:  Discontinued     500 mg 100 mL/hr over 60 Minutes Intravenous Every 8 hours 10/23/15 1034 10/27/15 0822   10/23/15 1100  ciprofloxacin (CIPRO) IVPB 400 mg  Status:  Discontinued     400 mg 200 mL/hr over 60 Minutes Intravenous 2 times daily 10/23/15 1034 10/27/15 0822      Assessment Ischemic enteritis-significantly improved.   LOS: 5 days   Rec:  Continue another 24 hours and then stop.  Will be available if needed.   Shakti Fleer Shela Commons 10/28/2015

## 2015-10-29 LAB — HEPARIN LEVEL (UNFRACTIONATED): Heparin Unfractionated: 0.75 IU/mL — ABNORMAL HIGH (ref 0.30–0.70)

## 2015-10-29 LAB — CBC
HEMATOCRIT: 42.4 % (ref 39.0–52.0)
Hemoglobin: 14 g/dL (ref 13.0–17.0)
MCH: 29.9 pg (ref 26.0–34.0)
MCHC: 33 g/dL (ref 30.0–36.0)
MCV: 90.6 fL (ref 78.0–100.0)
Platelets: 219 10*3/uL (ref 150–400)
RBC: 4.68 MIL/uL (ref 4.22–5.81)
RDW: 13 % (ref 11.5–15.5)
WBC: 10.2 10*3/uL (ref 4.0–10.5)

## 2015-10-29 LAB — GLUCOSE, CAPILLARY: GLUCOSE-CAPILLARY: 163 mg/dL — AB (ref 65–99)

## 2015-10-29 MED ORDER — METRONIDAZOLE 500 MG PO TABS: 500.0000 mg | ORAL_TABLET | Freq: Three times a day (TID) | ORAL | 0 refills | Status: DC

## 2015-10-29 MED ORDER — ROSUVASTATIN CALCIUM 20 MG PO TABS: 20.0000 mg | ORAL_TABLET | Freq: Every day | ORAL | 0 refills | Status: DC

## 2015-10-29 MED ORDER — RIVAROXABAN 20 MG PO TABS
20.0000 mg | ORAL_TABLET | Freq: Every day | ORAL | 0 refills | Status: DC
Start: 2015-10-29 — End: 2015-11-27

## 2015-10-29 MED ORDER — CIPROFLOXACIN HCL 500 MG PO TABS: 500.0000 mg | ORAL_TABLET | Freq: Two times a day (BID) | ORAL | 0 refills | Status: DC

## 2015-10-29 NOTE — Discharge Summary (Signed)
Physician Discharge Summary  Leroy [name] RUE:[id number] DOB: 1946-02-23 DOA: 10/22/2015  PCP: [name], MD  Admit date: 10/22/2015 Discharge date: 10/29/2015  Time spent: 40 minutes  Recommendations for Outpatient Follow-up:  1. Follow up with GI in 2 week. 2. Follow up with cards in 6 weeks check FLP.   Discharge Diagnoses:  Principal Problem:   RLQ abdominal pain Active Problems:   ATRIAL FIBRILLATION, PAROXYSMAL   Diabetes mellitus type 2 with peripheral artery disease (HCC)   Nausea and vomiting   Ischemic colitis [facility])   Discharge Condition: stable  Diet recommendation: heart healthy  Filed Weights   10/23/15 0529 10/25/15 0500  Weight: 128.1 kg (282 lb 6.6 oz) 130.6 kg (287 lb 14.7 oz)    History of present illness:  70 year old with past medical history significant for peripheral vascular disease, poorly controlled diabetes mellitus type 2 on insulin with an A1c of 12.0 last on February 2017 that comes into the hospital complaining of abdominal pain nausea and vomiting located in his right lower quadrant started several days prior to admission.  Hospital Course:  Right lower quadrant abdominal pain due to ischemic bowel disease: He was admitted to the hospital his antihypertensive medications were held he was started on IV fluids and empiric antibiotics, heparin and place nothing by mouth. CT scan of the abdomen pelvis was done that showed inflammation of the terminal ileum and cecum. GI was consulted and surgery who agreed to proceed with colonoscopy performed on 10/24/2015 that show synchro differential inflammation and ulceration of the distal ileum and cecum biopsy were performed which were consistent with ischemic colitis. His diet was resume when his abdominal pain improved which she tolerated well. The patient had missed his Xarelto for several days. Case manager was consulted which will assist with his Xarelto.  Paroxysmal atrial fibrillation: No changes  were made to his medication.  Diabetes mellitus type 2 with peripheral vascular disease: No changes were made to his dedication.  Chronic systolic and diastolic heart failure: Cardiology was consulted no changes were made to his medication.  Peripheral arterial disease with bilateral BKA's: No known wound supportive care was to continue.  Acute kidney injury: Resolved likely due to prerenal etiology resolved with IV fluid hydration.    Procedures:  CT scan of the abdomen and pelvis  Colonoscopy  Consultations:  Gastroenterology [facility] surgery  Discharge Exam: Vitals:   10/28/15 2056 10/29/15 0405  BP: (!) 140/96 93/61  Pulse: 70 82  Resp: 16 18  Temp: 99 F (37.2 C) 99 F (37.2 C)    General: A&O x3 CardiovasculAar: RRR Respiratory: good air movement and cleat to auscultation  Discharge Instructions   Discharge Instructions    Diet - low sodium heart healthy    Complete by:  As directed   Increase activity slowly    Complete by:  As directed     Current Discharge Medication List    START taking these medications   Details  ciprofloxacin (CIPRO) 500 MG tablet Take 1 tablet (500 mg total) by mouth 2 (two) times daily. Qty: 4 tablet, Refills: 0    metroNIDAZOLE (FLAGYL) 500 MG tablet Take 1 tablet (500 mg total) by mouth every 8 (eight) hours. Qty: 6 tablet, Refills: 0      CONTINUE these medications which have CHANGED   Details  rivaroxaban (XARELTO) 20 MG TABS tablet Take 1 tablet (20 mg total) by mouth daily at 6 PM. Qty: 30 tablet, Refills: 0   Associated  Diagnoses: Paroxysmal atrial fibrillation (HCC)    rosuvastatin (CRESTOR) 20 MG tablet Take 1 tablet (20 mg total) by mouth daily at 6 PM. Qty: 30 tablet, Refills: 0      CONTINUE these medications which have NOT CHANGED   Details  aspirin EC 81 MG tablet Take 81 mg by mouth daily.    Canagliflozin-Metformin HCl 50-1000 MG TABS Take 1 tablet by mouth 2 (two) times daily.      ezetimibe (ZETIA) 10 MG tablet Take 1 tablet (10 mg total) by mouth daily. Qty: 30 tablet, Refills: 1    insulin aspart (NOVOLOG) 100 UNIT/ML injection Inject 10 Units into the skin 3 (three) times daily as needed for high blood sugar (BLOOD SUGARS GREATER THAN 200).     insulin glargine (LANTUS) 100 UNIT/ML injection Inject 50 Units into the skin 2 (two) times daily.    lisinopril (PRINIVIL,ZESTRIL) 5 MG tablet Take 1 tablet (5 mg total) by mouth daily. Qty: 30 tablet, Refills: 0   Associated Diagnoses: Chronic systolic heart failure (HCC); Atherosclerosis of native coronary artery of native heart without angina pectoris; Atherosclerosis of native arteries of the extremities with ulceration (HCC)    metoprolol succinate (TOPROL-XL) 50 MG 24 hr tablet Take 50 mg by mouth daily. Take with or immediately following a meal.    nitroGLYCERIN (NITROSTAT) 0.4 MG SL tablet Place 1 tablet (0.4 mg total) under the tongue every 5 (five) minutes as needed for chest pain. Qty: 30 tablet, Refills: 12       Allergies  Allergen Reactions  . Statins Other (See Comments)    Reaction:  Muscle pain   . Amlodipine Besylate Other (See Comments)    Reaction:  Muscle pain   . Cephalexin Hives      The results of significant diagnostics from this hospitalization (including imaging, microbiology, ancillary and laboratory) are listed below for reference.    Significant Diagnostic Studies: Dg Chest 2 View  Result Date: 10/22/2015 CLINICAL DATA:  Shortness of breath. EXAM: CHEST  2 VIEW COMPARISON:  April 16, 2013 FINDINGS: The heart size borderline. No pulmonary nodules, masses, or infiltrates. No pneumothorax. IMPRESSION: No active cardiopulmonary disease. Electronically Signed   By: Gerome Sam III M.D   On: 10/22/2015 20:56   Ct Abdomen Pelvis W Contrast  Result Date: 10/22/2015 CLINICAL DATA:  Right lower quadrant pain. EXAM: CT ABDOMEN AND PELVIS WITH CONTRAST TECHNIQUE: Multidetector CT  imaging of the abdomen and pelvis was performed using the standard protocol following bolus administration of intravenous contrast. CONTRAST:  ISOVUE-300 IOPAMIDOL (ISOVUE-300) INJECTION 61% COMPARISON:  None. FINDINGS: Lower chest and abdominal wall: Remote left ventricular infarct with subendocardial scarring along the septum. Hepatobiliary: No focal liver abnormality.No evidence of biliary obstruction or stone. Pancreas: Unremarkable. Spleen: Unremarkable. Adrenals/Urinary Tract: Negative adrenals. Patchy renal cortical scarring, right worse than left. No hydronephrosis or ureteral calculus. Unremarkable bladder. Stomach/Bowel: Dilated ileal loops containing fluid and fecalized contents with mild mesenteric congestion. Transition point the terminal ileum there is borderline wall thickening. No causative mass is seen. Extensive atherosclerosis, without visible or proximal occlusion; affected segments have enhancing walls. No signs of bowel necrosis. Few uncomplicated colonic diverticula. Distal colonic diverticulum. Reproductive:No pathologic findings. Vascular/Lymphatic: Extensive aortic and branch vessel atherosclerosis. Remote vascular surgery in the bilateral groin. Poor or no enhancement at the level of the right left SFA. Vascular evaluation is limited by contrast timing. No acute visceral artery finding. Other: No ascites or pneumoperitoneum. Musculoskeletal: No acute abnormalities. Advanced lower lumbar facet  arthropathy. Sacroiliac ankylosis with spurring. IMPRESSION: 1. Partial ileal obstruction without definitive underlying cause. Questionable regional enteritis. Patient has severe atherosclerosis, correlate with lactate. 2. Poor or no enhancement at the bilateral SFA. Remote left ventricular infarct. 3. Bilateral renal cortical scarring. Electronically Signed   By: Marnee Spring M.D.   On: 10/22/2015 23:34   US Renal  Result Date: 10/24/2015 CLINICAL DATA:  Acute renal failure, history of  diabetes and hypertension. EXAM: RENAL / URINARY TRACT ULTRASOUND COMPLETE COMPARISON:  Abdominal and pelvic CT scan with contrast dated October 22, 2015 FINDINGS: Right Kidney: Length: 13.2 cm. The renal cortical echotexture is increased and is approximately equal to that of the liver. There is no hydronephrosis. No discrete mass is observed. The area of presumed scarring in the posterior aspect of the midpole of the right kidney seen on the CT scan of 16 July is not observed on today's ultrasound. Left Kidney: Length: 13.1 cm. The renal cortical echotexture is mildly increased similar to that on the right. There is no hydronephrosis. Bladder: Appears normal for degree of bladder distention. IMPRESSION: Increased renal cortical echotexture consistent with medical renal disease. There is no hydronephrosis. The urinary bladder is unremarkable. Electronically Signed   By: David  [place] M.D.   On: 10/24/2015 13:46   Dg Abd 2 Views  Result Date: 10/25/2015 CLINICAL DATA:  Continued abdominal pain and distention. EXAM: ABDOMEN - 2 VIEW COMPARISON:  10/23/2015 FINDINGS: Right-side-up decubitus film shows no evidence for intraperitoneal free air. Supine film shows diffuse gaseous distention of large and small bowel, slightly progressive in the interval. IMPRESSION: Worsening gaseous distention of small bowel and colon suggests evolving ileus. Electronically Signed   By: Kennith Center M.D.   On: 10/25/2015 14:29   Dg Abd 2 Views  Result Date: 10/23/2015 CLINICAL DATA:  Generalized abdominal pain. EXAM: ABDOMEN - 2 VIEW COMPARISON:  CT scan of October 22, 2015. FINDINGS: The bowel gas pattern is normal. There is no evidence of free air. No radio-opaque calculi or other significant radiographic abnormality is seen. IMPRESSION: No evidence of bowel obstruction or ileus. Electronically Signed   By: Lupita Raider, M.D.   On: 10/23/2015 15:03    Microbiology: Recent Results (from the past 240 hour(s))  MRSA PCR Screening      Status: None   Collection Time: 10/23/15  5:38 AM  Result Value Ref Range Status   MRSA by PCR NEGATIVE NEGATIVE Final    Comment:        The GeneXpert MRSA Assay (FDA approved for NASAL specimens only), is one component of a comprehensive MRSA colonization surveillance program. It is not intended to diagnose MRSA infection nor to guide or monitor treatment for MRSA infections.      Labs: Basic Metabolic Panel:  Recent Labs Lab 10/24/15 0100 10/24/15 0101 10/25/15 0332 10/26/15 0430 10/27/15 0437 10/28/15 0510  NA 136  --  135 134* 137 136  K 4.0  --  3.2* 3.8 3.6 3.8  CL 102  --  106 106 106 107  CO2 23  --  22 20* 25 23  GLUCOSE 146*  --  95 164* 114* 226*  BUN 22*  --  18 12 12 13   CREATININE 1.67*  --  1.24 1.04 1.08 1.18  CALCIUM 8.4*  --  7.5* 8.0* 7.9* 7.9*  MG  --  1.9  --   --   --   --    Liver Function Tests: No results for input(s): AST, ALT,  ALKPHOS, BILITOT, PROT, ALBUMIN in the last 168 hours.  Recent Labs Lab 10/22/15 2046  LIPASE 14   No results for input(s): AMMONIA in the last 168 hours. CBC:  Recent Labs Lab 10/25/15 0332 10/26/15 0430 10/27/15 0437 10/28/15 0510 10/29/15 0514  WBC 16.2* 13.8* 9.8 9.9 10.2  HGB 13.3 14.5 14.1 13.7 14.0  HCT 38.9* 42.7 41.3 40.8 42.4  MCV 88.8 89.5 88.4 89.3 90.6  PLT 184 189 185 211 219   Cardiac Enzymes: No results for input(s): CKTOTAL, CKMB, CKMBINDEX, TROPONINI in the last 168 hours. BNP: BNP (last 3 results)  Recent Labs  10/22/15 2119  BNP 147.3*    ProBNP (last 3 results) No results for input(s): PROBNP in the last 8760 hours.  CBG:  Recent Labs Lab 10/27/15 2116 10/28/15 0746 10/28/15 1135 10/28/15 1705 10/28/15 2141  GLUCAP 229* 213* 216* 198* 134*       Signed:  Marinda Elk MD.  [facility] 10/29/2015, 7:32 AM

## 2015-10-29 NOTE — Progress Notes (Signed)
Discharge instructions reviewed with patient, questions answered, verbalized understanding.  Patient transported to front of hospital via wheelchair accompanied by Nurse Tech to be taken home by family members.

## 2015-11-01 ENCOUNTER — Other Ambulatory Visit: Payer: Self-pay | Admitting: Nurse Practitioner

## 2015-11-01 DIAGNOSIS — I2510 Atherosclerotic heart disease of native coronary artery without angina pectoris: Secondary | ICD-10-CM

## 2015-11-01 DIAGNOSIS — I7025 Atherosclerosis of native arteries of other extremities with ulceration: Secondary | ICD-10-CM

## 2015-11-01 DIAGNOSIS — I5022 Chronic systolic (congestive) heart failure: Secondary | ICD-10-CM

## 2015-11-02 ENCOUNTER — Other Ambulatory Visit: Payer: Self-pay | Admitting: Internal Medicine

## 2015-11-02 DIAGNOSIS — E1165 Type 2 diabetes mellitus with hyperglycemia: Secondary | ICD-10-CM

## 2015-11-02 DIAGNOSIS — I7025 Atherosclerosis of native arteries of other extremities with ulceration: Secondary | ICD-10-CM

## 2015-11-02 DIAGNOSIS — IMO0002 Reserved for concepts with insufficient information to code with codable children: Secondary | ICD-10-CM

## 2015-11-02 DIAGNOSIS — I5022 Chronic systolic (congestive) heart failure: Secondary | ICD-10-CM

## 2015-11-02 DIAGNOSIS — E118 Type 2 diabetes mellitus with unspecified complications: Principal | ICD-10-CM

## 2015-11-02 DIAGNOSIS — I2510 Atherosclerotic heart disease of native coronary artery without angina pectoris: Secondary | ICD-10-CM

## 2015-11-03 ENCOUNTER — Other Ambulatory Visit: Payer: Self-pay | Admitting: Internal Medicine

## 2015-11-03 ENCOUNTER — Telehealth: Payer: Self-pay | Admitting: Internal Medicine

## 2015-11-03 DIAGNOSIS — I5022 Chronic systolic (congestive) heart failure: Secondary | ICD-10-CM

## 2015-11-03 DIAGNOSIS — I7025 Atherosclerosis of native arteries of other extremities with ulceration: Secondary | ICD-10-CM

## 2015-11-03 DIAGNOSIS — R809 Proteinuria, unspecified: Secondary | ICD-10-CM

## 2015-11-03 DIAGNOSIS — E1151 Type 2 diabetes mellitus with diabetic peripheral angiopathy without gangrene: Secondary | ICD-10-CM

## 2015-11-03 DIAGNOSIS — I2510 Atherosclerotic heart disease of native coronary artery without angina pectoris: Secondary | ICD-10-CM

## 2015-11-03 MED ORDER — LISINOPRIL 5 MG PO TABS: 5.0000 mg | ORAL_TABLET | Freq: Every day | ORAL | 1 refills | Status: DC

## 2015-11-03 NOTE — Telephone Encounter (Signed)
Patient has a CPE scheduled for 11/13/2015- he has to cancel his appt on 07/20 due to being hospitalized. He is totally out of lisinopril (PRINIVIL,ZESTRIL) 5 MG tablet [[id number]]  . Please issue a temporary script until the app[facility] [address]

## 2015-11-13 ENCOUNTER — Other Ambulatory Visit (INDEPENDENT_AMBULATORY_CARE_PROVIDER_SITE_OTHER): Payer: Commercial Managed Care - HMO

## 2015-11-13 ENCOUNTER — Other Ambulatory Visit: Payer: Self-pay | Admitting: Internal Medicine

## 2015-11-13 ENCOUNTER — Other Ambulatory Visit: Payer: Self-pay

## 2015-11-13 ENCOUNTER — Ambulatory Visit (INDEPENDENT_AMBULATORY_CARE_PROVIDER_SITE_OTHER)
Admission: RE | Admit: 2015-11-13 | Discharge: 2015-11-13 | Disposition: A | Discharge status: Home / Self Care | Payer: Commercial Managed Care - HMO | Source: Ambulatory Visit | Attending: Internal Medicine | Admitting: Internal Medicine

## 2015-11-13 ENCOUNTER — Ambulatory Visit (INDEPENDENT_AMBULATORY_CARE_PROVIDER_SITE_OTHER): Payer: Commercial Managed Care - HMO | Admitting: Internal Medicine

## 2015-11-13 ENCOUNTER — Encounter: Payer: Self-pay | Admitting: Internal Medicine

## 2015-11-13 VITALS — BP 120/80 | HR 74 | Temp 98.9°F | Resp 16 | Ht 69.0 in | Wt 293.8 lb

## 2015-11-13 DIAGNOSIS — R1031 Right lower quadrant pain: Secondary | ICD-10-CM

## 2015-11-13 DIAGNOSIS — E1151 Type 2 diabetes mellitus with diabetic peripheral angiopathy without gangrene: Secondary | ICD-10-CM

## 2015-11-13 DIAGNOSIS — Z5189 Encounter for other specified aftercare: Secondary | ICD-10-CM

## 2015-11-13 DIAGNOSIS — Z89511 Acquired absence of right leg below knee: Secondary | ICD-10-CM

## 2015-11-13 DIAGNOSIS — R809 Proteinuria, unspecified: Secondary | ICD-10-CM | POA: Diagnosis not present

## 2015-11-13 DIAGNOSIS — Z89512 Acquired absence of left leg below knee: Secondary | ICD-10-CM

## 2015-11-13 DIAGNOSIS — K559 Vascular disorder of intestine, unspecified: Secondary | ICD-10-CM

## 2015-11-13 DIAGNOSIS — R197 Diarrhea, unspecified: Secondary | ICD-10-CM | POA: Diagnosis not present

## 2015-11-13 DIAGNOSIS — I7025 Atherosclerosis of native arteries of other extremities with ulceration: Secondary | ICD-10-CM

## 2015-11-13 LAB — AMYLASE: AMYLASE: 47 U/L (ref 27–131)

## 2015-11-13 LAB — CBC WITH DIFFERENTIAL/PLATELET
BASOS PCT: 0.4 % (ref 0.0–3.0)
Basophils Absolute: 0 10*3/uL (ref 0.0–0.1)
EOS PCT: 0.8 % (ref 0.0–5.0)
Eosinophils Absolute: 0.1 10*3/uL (ref 0.0–0.7)
HCT: 43.1 % (ref 39.0–52.0)
HEMOGLOBIN: 14.7 g/dL (ref 13.0–17.0)
LYMPHS ABS: 1.4 10*3/uL (ref 0.7–4.0)
Lymphocytes Relative: 22.8 % (ref 12.0–46.0)
MCHC: 34.2 g/dL (ref 30.0–36.0)
MCV: 87.3 fl (ref 78.0–100.0)
MONO ABS: 0.7 10*3/uL (ref 0.1–1.0)
MONOS PCT: 11 % (ref 3.0–12.0)
Neutro Abs: 4 10*3/uL (ref 1.4–7.7)
Neutrophils Relative %: 65 % (ref 43.0–77.0)
Platelets: 278 10*3/uL (ref 150.0–400.0)
RBC: 4.93 Mil/uL (ref 4.22–5.81)
RDW: 13.5 % (ref 11.5–15.5)
WBC: 6.2 10*3/uL (ref 4.0–10.5)

## 2015-11-13 LAB — BASIC METABOLIC PANEL
BUN: 10 mg/dL (ref 6–23)
CALCIUM: 8.6 mg/dL (ref 8.4–10.5)
CO2: 27 meq/L (ref 19–32)
CREATININE: 1.18 mg/dL (ref 0.40–1.50)
Chloride: 105 mEq/L (ref 96–112)
GFR: 78.76 mL/min (ref >60.00)
GLUCOSE: 101 mg/dL — AB (ref 70–99)
Potassium: 4.3 mEq/L (ref 3.5–5.1)
Sodium: 138 mEq/L (ref 135–145)

## 2015-11-13 LAB — LIPASE: Lipase: 5 U/L — ABNORMAL LOW (ref 11.0–59.0)

## 2015-11-13 NOTE — Patient Outreach (Signed)
[facility]) Care Management  11/13/2015  QUENTION SCURA April 14, 1945 [id number]   Telephone Screen  Referral Date: 11/10/15 Referral Source: [employer] Referral Reason: "ongoing education, disease and symptom management, high risk for readmission, hx of DM, MI, PVD   Outreach attempt #1 to patient. Spoke with patient who reported he was headed to MD appt.    Plan: RN CM will make outreach attempt to patient within three business days.   Antionette Fairy, RN,BSN,CCM [facility] Care Management Telephonic Care Management Coordinator Direct Phone: [phone] Toll Free: [phone] Fax: [phone]

## 2015-11-13 NOTE — Patient Instructions (Signed)

## 2015-11-13 NOTE — Progress Notes (Signed)
Subjective:  Patient ID: Leroy [name], male    DOB: December 30, 1945  Age: 70 y.o. MRN: [id number]  CC: Abdominal Pain   HPI Leroy [name] presents for follow-up after a recent admission for right lower quadrant abdominal pain that was related to ischemic colitis. He tells me he is getting better and tells me the abdominal pain is diminishing. He does occasionally have some discomfort in the right lower quadrant but it only occurs at night. He has had rare episodes of nausea and vomiting but has maintained a good appetite and has gained weight since he was discharged. His bowel movements are normal with no blood or mucus.  Outpatient Medications Prior to Visit  Medication Sig Dispense Refill  . aspirin EC 81 MG tablet Take 81 mg by mouth daily.    . Canagliflozin-Metformin HCl 50-1000 MG TABS Take 1 tablet by mouth 2 (two) times daily.     [name] ezetimibe (ZETIA) 10 MG tablet Take 1 tablet (10 mg total) by mouth daily. 30 tablet 1  . insulin aspart (NOVOLOG) 100 UNIT/ML injection Inject 10 Units into the skin 3 (three) times daily as needed for high blood sugar (BLOOD SUGARS GREATER THAN 200).     . insulin glargine (LANTUS) 100 UNIT/ML injection Inject 50 Units into the skin 2 (two) times daily.    [name] lisinopril (PRINIVIL,ZESTRIL) 5 MG tablet Take 1 tablet (5 mg total) by mouth daily. 90 tablet 1  . metoprolol succinate (TOPROL-XL) 50 MG 24 hr tablet Take 50 mg by mouth daily. Take with or immediately following a meal.    . nitroGLYCERIN (NITROSTAT) 0.4 MG SL tablet Place 1 tablet (0.4 mg total) under the tongue every 5 (five) minutes as needed for chest pain. 30 tablet 12  . rivaroxaban (XARELTO) 20 MG TABS tablet Take 1 tablet (20 mg total) by mouth daily at 6 PM. 30 tablet 0  . rosuvastatin (CRESTOR) 20 MG tablet Take 1 tablet (20 mg total) by mouth daily at 6 PM. 30 tablet 0  . ciprofloxacin (CIPRO) 500 MG tablet Take 1 tablet (500 mg total) by mouth 2 (two) times daily. (Patient not taking:  Reported on 11/13/2015) 4 tablet 0  . metroNIDAZOLE (FLAGYL) 500 MG tablet Take 1 tablet (500 mg total) by mouth every 8 (eight) hours. (Patient not taking: Reported on 11/13/2015) 6 tablet 0   No facility-administered medications prior to visit.     ROS Review of Systems  Constitutional: Positive for fatigue. Negative for appetite change, chills, fever and unexpected weight change.  HENT: Negative.  Negative for trouble swallowing.   Eyes: Negative.   Respiratory: Negative for cough, choking, chest tightness, shortness of breath and stridor.   Cardiovascular: Negative.  Negative for chest pain, palpitations and leg swelling.  Gastrointestinal: Positive for abdominal pain, nausea and vomiting. Negative for blood in stool, constipation and diarrhea.  Endocrine: Negative.  Negative for polydipsia, polyphagia and polyuria.  Genitourinary: Negative.  Negative for dysuria, flank pain, frequency, hematuria and urgency.  Musculoskeletal: Negative.  Negative for myalgias.  Skin: Negative.   Allergic/Immunologic: Negative.   Neurological: Negative.  Negative for dizziness, weakness, light-headedness and numbness.  Hematological: Negative.  Does not bruise/bleed easily.  Psychiatric/Behavioral: Negative.     Objective:  Pulse 74   Temp 98.9 F (37.2 C) (Oral)   Resp 20   Ht  (1.753 m)   Wt 293 lb 12.8 oz (133.3 kg)   SpO2 (!) 86%   BMI 43.39 kg/m  BP Readings from Last 3 Encounters:  10/29/15 (!) 120/45  09/21/15 122/70  06/21/15 140/70    Wt Readings from Last 3 Encounters:  11/13/15 293 lb 12.8 oz (133.3 kg)  10/25/15 287 lb 14.7 oz (130.6 kg)  06/21/15 299 lb (135.6 kg)    Physical Exam  Constitutional: He is oriented to person, place, and time.  Non-toxic appearance. He has a sickly appearance. He appears ill. No distress.  Wheelchair-bound Bilateral BKA  HENT:  Mouth/Throat: Oropharynx is clear and moist. No oropharyngeal exudate.  Eyes: Conjunctivae are normal.  Right eye exhibits no discharge. Left eye exhibits no discharge. No scleral icterus.  Neck: Normal range of motion. Neck supple. No JVD present. No tracheal deviation present. No thyromegaly present.  Cardiovascular: Normal rate, regular rhythm, normal heart sounds and intact distal pulses.  Exam reveals no gallop and no friction rub.   No murmur heard. Pulmonary/Chest: Effort normal and breath sounds normal. No stridor. No respiratory distress. He has no wheezes. He has no rales. He exhibits no tenderness.  Abdominal: Soft. Normal appearance and bowel sounds are normal. He exhibits no distension and no mass. There is no hepatosplenomegaly, splenomegaly or hepatomegaly. There is tenderness in the right lower quadrant. There is no rebound, no guarding and no CVA tenderness.  Musculoskeletal: He exhibits no edema.  Lymphadenopathy:    He has no cervical adenopathy.  Neurological: He is oriented to person, place, and time.  Skin: Skin is warm and dry. No rash noted. He is not diaphoretic. No erythema. No pallor.  Vitals reviewed.   Lab Results  Component Value Date   WBC 6.2 11/13/2015   HGB 14.7 11/13/2015   HCT 43.1 11/13/2015   PLT 278.0 11/13/2015   GLUCOSE 101 (H) 11/13/2015   CHOL 209 (H) 05/23/2015   TRIG 108 05/23/2015   HDL 45 05/23/2015   LDLDIRECT 209.0 09/26/2014   LDLCALC 142 (H) 05/23/2015   ALT 16 (L) 05/23/2015   AST 16 05/23/2015   NA 138 11/13/2015   K 4.3 11/13/2015   CL 105 11/13/2015   CREATININE 1.18 11/13/2015   BUN 10 11/13/2015   CO2 27 11/13/2015   TSH 1.08 01/28/2014   INR 1.01 05/22/2015   HGBA1C 12.0 (H) 05/23/2015   MICROALBUR 77.6 (H) 09/26/2014    Dg Chest 2 View  Result Date: 10/22/2015 CLINICAL DATA:  Shortness of breath. EXAM: CHEST  2 VIEW COMPARISON:  April 16, 2013 FINDINGS: The heart size borderline. No pulmonary nodules, masses, or infiltrates. No pneumothorax. IMPRESSION: No active cardiopulmonary disease. Electronically Signed   By:  Gerome Sam III M.D   On: 10/22/2015 20:56   Ct Abdomen Pelvis W Contrast  Result Date: 10/22/2015 CLINICAL DATA:  Right lower quadrant pain. EXAM: CT ABDOMEN AND PELVIS WITH CONTRAST TECHNIQUE: Multidetector CT imaging of the abdomen and pelvis was performed using the standard protocol following bolus administration of intravenous contrast. CONTRAST:  ISOVUE-300 IOPAMIDOL (ISOVUE-300) INJECTION 61% COMPARISON:  None. FINDINGS: Lower chest and abdominal wall: Remote left ventricular infarct with subendocardial scarring along the septum. Hepatobiliary: No focal liver abnormality.No evidence of biliary obstruction or stone. Pancreas: Unremarkable. Spleen: Unremarkable. Adrenals/Urinary Tract: Negative adrenals. Patchy renal cortical scarring, right worse than left. No hydronephrosis or ureteral calculus. Unremarkable bladder. Stomach/Bowel: Dilated ileal loops containing fluid and fecalized contents with mild mesenteric congestion. Transition point the terminal ileum there is borderline wall thickening. No causative mass is seen. Extensive atherosclerosis, without visible or proximal occlusion; affected segments have enhancing walls.  No signs of bowel necrosis. Few uncomplicated colonic diverticula. Distal colonic diverticulum. Reproductive:No pathologic findings. Vascular/Lymphatic: Extensive aortic and branch vessel atherosclerosis. Remote vascular surgery in the bilateral groin. Poor or no enhancement at the level of the right left SFA. Vascular evaluation is limited by contrast timing. No acute visceral artery finding. Other: No ascites or pneumoperitoneum. Musculoskeletal: No acute abnormalities. Advanced lower lumbar facet arthropathy. Sacroiliac ankylosis with spurring. IMPRESSION: 1. Partial ileal obstruction without definitive underlying cause. Questionable regional enteritis. Patient has severe atherosclerosis, correlate with lactate. 2. Poor or no enhancement at the bilateral SFA. Remote left  ventricular infarct. 3. Bilateral renal cortical scarring. Electronically Signed   By: Marnee Spring M.D.   On: 10/22/2015 23:34   Dg Abd 2 Views  Result Date: 10/23/2015 CLINICAL DATA:  Generalized abdominal pain. EXAM: ABDOMEN - 2 VIEW COMPARISON:  CT scan of October 22, 2015. FINDINGS: The bowel gas pattern is normal. There is no evidence of free air. No radio-opaque calculi or other significant radiographic abnormality is seen. IMPRESSION: No evidence of bowel obstruction or ileus. Electronically Signed   By: Lupita Raider, M.D.   On: 10/23/2015 15:03    Assessment & Plan:   Leroy [name] was seen today for abdominal pain.  Diagnoses and all orders for this visit:  Atherosclerosis of native arteries of the extremities with ulceration (HCC)- will continue to modify his risk factors to prevent further ischemic events  Diabetes mellitus type 2 with peripheral artery disease (HCC)- will continue current medications to control his blood sugars. -     Cancel: Comprehensive metabolic panel; Future -     Cancel: Hemoglobin A1c; Future -     Microalbumin / creatinine urine ratio; Future -     Urinalysis, Routine w reflex microscopic (not at [facility]); Future -     Basic metabolic panel; Future  S/P bilateral BKA (below knee amputation) (HCC)- no complications noted  S/P BKA (below knee amputation) bilateral (HCC)- no complications noted  Microalbuminuria- he could not produce urine today -     Microalbumin / creatinine urine ratio; Future -     Urinalysis, Routine w reflex microscopic (not at [facility]); Future  Ischemic colitis (HCC)- it appears that this is improving, his bicarbonate is normal today so I don't think he has any ischemic: At this time as there is no evidence of acidosis, his plain films are negative for any small bowel obstruction or ileus. His blood pressure is normalized, we'll continue the current medications. -     Cancel: DG Abd Acute W/Chest; Future -     Lipase; Future -      Amylase; Future -     CBC with Differential/Platelet; Future -     Basic metabolic panel; Future -     DG Abd Acute W/Chest; Future  Pain, abdominal, RLQ- his exam does not reveal any acute abdominal process, his plain films are normal and all of his labs are unremarkable, it appears the pain from his ischemic colitis is improving. -     Lipase; Future -     Amylase; Future -     CBC with Differential/Platelet; Future -     Basic metabolic panel; Future -     DG Abd Acute W/Chest; Future   I have discontinued Mr. Haberman's metroNIDAZOLE and ciprofloxacin. I am also having him maintain his ezetimibe, nitroGLYCERIN, Canagliflozin-Metformin HCl, aspirin EC, insulin aspart, insulin glargine, metoprolol succinate, rivaroxaban, rosuvastatin, lisinopril, and B-D UF III MINI PEN NEEDLES.  Meds ordered  this encounter  Medications  . B-D UF III MINI PEN NEEDLES 31G X 5 MM MISC     Follow-up: Return in about 1 day (around 11/14/2015).  [name], MD

## 2015-11-13 NOTE — Progress Notes (Signed)
Pre visit review using our clinic review tool, if applicable. No additional management support is needed unless otherwise documented below in the visit note. 

## 2015-11-14 ENCOUNTER — Other Ambulatory Visit: Payer: Self-pay

## 2015-11-14 DIAGNOSIS — E119 Type 2 diabetes mellitus without complications: Secondary | ICD-10-CM

## 2015-11-14 DIAGNOSIS — Z794 Long term (current) use of insulin: Principal | ICD-10-CM

## 2015-11-14 LAB — URINALYSIS, ROUTINE W REFLEX MICROSCOPIC
BILIRUBIN URINE: NEGATIVE
Ketones, ur: NEGATIVE
LEUKOCYTES UA: NEGATIVE
NITRITE: NEGATIVE
PH: 6 (ref 5.0–8.0)
Specific Gravity, Urine: <=1.005 — AB (ref 1.000–1.030)
TOTAL PROTEIN, URINE-UPE24: NEGATIVE
URINE GLUCOSE: NEGATIVE
Urobilinogen, UA: 0.2 (ref 0.0–1.0)

## 2015-11-14 LAB — MICROALBUMIN / CREATININE URINE RATIO
CREATININE, U: 59.4 mg/dL
MICROALB/CREAT RATIO: 16.2 mg/g (ref 0.0–30.0)
Microalb, Ur: 9.6 mg/dL — ABNORMAL HIGH (ref 0.0–1.9)

## 2015-11-14 NOTE — Patient Outreach (Signed)
[facility]) Care Management  11/14/2015  Leroy [name] 1945/09/13 [id number]     Telephone Screen  Referral Date: 11/10/15 Referral Source: [employer] Referral Reason: "ongoing education, disease and symptom management, high risk for readmission, hx of DM, MI, PVD    Outreach attempt #2 to patient. Patient reached and screening completed.  Social: Patient resides in his home. He states that he recently had a caregiver/aide(Kim) to move in with him to assist with his care needs. Patient states that he requires assistance with ADLs and IADLs. He relies on caregiver to transport him to MD appts. He reports that he has had about 1-2 falls within the last three months. He denies any injuries. DME in the home include wheelchair, shower chair and prosthesis which he reports he "don't know how to use."  Conditions: Patient with PMH: PAF, DM, PVD, ischemic colitis, bilateral amputee and CHF. Patient states that he is not monitoring cbgs and only checks them "when I think about it." However, he voices that he is taking his insulin 2x/day as ordered. A1C was 12.0(Feb 2017) per records. Patient states he was discharged from hospital on 10/29/15 following a "heat stroke" and "blood clot in stomach."   Medications: Per patient report he is taking less than 15 meds. Denies any issues with affording and obtaining meds. He states that he gets some meds through mail order and some at local pharmacy. He states that his caregiver gives him his meds daily.  Appointments: Patient saw PCP on yesterday.  Consent: verbal consent received from patient for [facility] services.  Plan: RN CM will notify [facility] administrative assistant of case status. RN CM will send [facility] community referral for further in home eval and assessment of care needs. RN CM provided patient with [facility] contact info for future reference.   Antionette Fairy, RN,BSN,CCM [facility] Care Management Telephonic Care Management  Coordinator Direct Phone: [phone] Toll Free: [phone] Fax: [phone]

## 2015-11-20 ENCOUNTER — Other Ambulatory Visit: Payer: Self-pay | Admitting: Internal Medicine

## 2015-11-20 ENCOUNTER — Telehealth: Payer: Self-pay | Admitting: Internal Medicine

## 2015-11-20 DIAGNOSIS — K559 Vascular disorder of intestine, unspecified: Secondary | ICD-10-CM

## 2015-11-20 NOTE — Telephone Encounter (Signed)
Referral ordered for GI

## 2015-11-20 NOTE — Telephone Encounter (Signed)
Pt called in work like to know results of labs last week.  He is still in pain and would like to know what the next step is?

## 2015-11-20 NOTE — Telephone Encounter (Signed)
Please advise at your convenience 

## 2015-11-21 NOTE — Telephone Encounter (Signed)
LVM for pt to call back as soon as possible.   

## 2015-11-22 ENCOUNTER — Other Ambulatory Visit: Payer: Self-pay

## 2015-11-22 NOTE — Patient Outreach (Signed)
[facility]) Care Management  11/22/15  Tina GriffithsWilliam J Nuccio 06-09-1945 [id number]  Attempted to outreach patient without success. Left HIPAA compliant voicemail with RNCM contact information and invited callback.  TurkeyVictoria R. Anthon Harpole, RN, BSN, CCM [facility] Care Management Coordinator [phone]

## 2015-11-23 NOTE — Telephone Encounter (Signed)
Will you try to contact pt with MD response: "GI referral"

## 2015-11-23 NOTE — Telephone Encounter (Signed)
Left vm for pt to callback 

## 2015-11-24 ENCOUNTER — Other Ambulatory Visit: Payer: Self-pay

## 2015-11-24 NOTE — Telephone Encounter (Signed)
Pt request to speak to the assistant, inform pt of the GI referral but he need more information about this.   He also mention he need refill for his meds but he is unable to tell me which med goes to which pharmacy, he need help about this as well.   Please call him [phone]

## 2015-11-24 NOTE — Patient Outreach (Signed)
[facility]) Care Management  11/24/15  Leroy GriffithsWilliam J [name] [birth year] [id number]   Second attempt to outreach patient without success. Left HIPAA compliant voicemail with RNCM contact information and invited callback.  TurkeyVictoria R. Ercil Cassis, RN, BSN, CCM [facility] Care Management Coordinator [phone]

## 2015-11-27 ENCOUNTER — Other Ambulatory Visit: Payer: Self-pay | Admitting: Internal Medicine

## 2015-11-27 ENCOUNTER — Telehealth: Payer: Self-pay | Admitting: Emergency Medicine

## 2015-11-27 DIAGNOSIS — I2510 Atherosclerotic heart disease of native coronary artery without angina pectoris: Secondary | ICD-10-CM

## 2015-11-27 DIAGNOSIS — I7025 Atherosclerosis of native arteries of other extremities with ulceration: Secondary | ICD-10-CM

## 2015-11-27 DIAGNOSIS — R809 Proteinuria, unspecified: Secondary | ICD-10-CM

## 2015-11-27 DIAGNOSIS — I5022 Chronic systolic (congestive) heart failure: Secondary | ICD-10-CM

## 2015-11-27 DIAGNOSIS — E1151 Type 2 diabetes mellitus with diabetic peripheral angiopathy without gangrene: Secondary | ICD-10-CM

## 2015-11-27 DIAGNOSIS — I480 Paroxysmal atrial fibrillation: Secondary | ICD-10-CM

## 2015-11-27 MED ORDER — CANAGLIFLOZIN-METFORMIN HCL 50-1000 MG PO TABS: 1.0000 | ORAL_TABLET | Freq: Two times a day (BID) | ORAL | 3 refills | Status: DC

## 2015-11-27 MED ORDER — INSULIN PEN NEEDLE 31G X 5 MM MISC: 3 refills | Status: DC

## 2015-11-27 MED ORDER — ROSUVASTATIN CALCIUM 20 MG PO TABS: 20.0000 mg | ORAL_TABLET | Freq: Every day | ORAL | 3 refills | Status: DC

## 2015-11-27 MED ORDER — LISINOPRIL 5 MG PO TABS: 5.0000 mg | ORAL_TABLET | Freq: Every day | ORAL | 1 refills | Status: DC

## 2015-11-27 MED ORDER — METOPROLOL SUCCINATE ER 50 MG PO TB24: 50.0000 mg | ORAL_TABLET | Freq: Two times a day (BID) | ORAL | 3 refills | Status: DC

## 2015-11-27 MED ORDER — RIVAROXABAN 20 MG PO TABS: 20.0000 mg | ORAL_TABLET | Freq: Every day | ORAL | 3 refills | Status: DC

## 2015-11-27 MED ORDER — INSULIN GLARGINE 100 UNIT/ML ~~LOC~~ SOLN: 50.0000 [IU] | Freq: Two times a day (BID) | SUBCUTANEOUS | 3 refills | Status: DC

## 2015-11-27 NOTE — Telephone Encounter (Signed)
Tried to call number but line was busy.

## 2015-11-27 NOTE — Telephone Encounter (Signed)
Pt called and states he needs a prescription refill on all 11 medications. He states they were waiting on lab results to see which meds he was going to need. He states they go to different pharmacies.Can you please call him back and advise thanks.

## 2015-11-29 ENCOUNTER — Other Ambulatory Visit: Payer: Self-pay

## 2015-11-29 NOTE — Patient Outreach (Signed)
[facility]) Care Management  11/29/15  Leroy [name] Jan 08, 1946 [id number]  Third and final outreach attempt completed without success on 11/29/2015. Left HIPAA compliant voicemail with RNCM contact information and requested callback.   Plan- RNCM will send outreach barrier letter and wait 10 business days before completing case closure if no return call has been received.  Turkey R. Marquitta Persichetti, RN, BSN, CCM [facility] Care Management Coordinator [phone]

## 2015-12-01 ENCOUNTER — Other Ambulatory Visit: Payer: Self-pay | Admitting: Internal Medicine

## 2015-12-01 DIAGNOSIS — E1151 Type 2 diabetes mellitus with diabetic peripheral angiopathy without gangrene: Secondary | ICD-10-CM

## 2015-12-01 MED ORDER — INSULIN GLARGINE 100 UNIT/ML ~~LOC~~ SOLN: 50.0000 [IU] | Freq: Two times a day (BID) | SUBCUTANEOUS | 3 refills | Status: DC

## 2015-12-04 ENCOUNTER — Telehealth: Payer: Self-pay | Admitting: Internal Medicine

## 2015-12-04 NOTE — Telephone Encounter (Signed)
Patient is out of all his medications.  Is there any way we could send some to [facility] at [address]?

## 2015-12-04 NOTE — Telephone Encounter (Signed)
Patient needs to know if he is to continue to take flagyl.  Looks like he was put on this when he went to the hospital.  Patient is also requesting a call to go to [facility] to inquire on is medications.  Patients states he has not received them yet.

## 2015-12-04 NOTE — Telephone Encounter (Signed)
Patient is also requesting results of last lab work and x ray ordered by Yetta Barre.

## 2015-12-04 NOTE — Telephone Encounter (Signed)
Pt informed that erx was done and sent to [facility] and to contact mailorder for ETA. Let us know if they are not going to be delivered early enough.

## 2015-12-15 ENCOUNTER — Other Ambulatory Visit: Payer: Self-pay

## 2015-12-18 ENCOUNTER — Telehealth: Payer: Self-pay | Admitting: Internal Medicine

## 2015-12-18 DIAGNOSIS — I480 Paroxysmal atrial fibrillation: Secondary | ICD-10-CM

## 2015-12-18 MED ORDER — RIVAROXABAN 20 MG PO TABS: 20.0000 mg | ORAL_TABLET | Freq: Every day | ORAL | 0 refills | Status: DC

## 2015-12-18 NOTE — Telephone Encounter (Signed)
Pt came in stating he wants [place] to resend Lantus and rivaroxaban (XARELTO) 20 MG TABS tablet to [facility] instead of [facility]. Pt stating he almost out of this med. Please help, he has been trying to get this done for over a week now.

## 2015-12-19 NOTE — Telephone Encounter (Signed)
Short erx sent to local pharmacy pt rq.

## 2015-12-21 ENCOUNTER — Telehealth: Payer: Self-pay | Admitting: *Deleted

## 2015-12-21 ENCOUNTER — Ambulatory Visit: Payer: Commercial Managed Care - HMO | Admitting: Nurse Practitioner

## 2015-12-21 NOTE — Telephone Encounter (Signed)
Pt no showed app today.

## 2015-12-22 ENCOUNTER — Ambulatory Visit: Payer: Commercial Managed Care - HMO | Admitting: Nurse Practitioner

## 2015-12-25 ENCOUNTER — Encounter: Payer: Self-pay | Admitting: Nurse Practitioner

## 2016-01-04 ENCOUNTER — Encounter: Payer: Self-pay | Admitting: Internal Medicine

## 2016-01-05 ENCOUNTER — Telehealth: Payer: Self-pay | Admitting: Emergency Medicine

## 2016-01-05 ENCOUNTER — Other Ambulatory Visit: Payer: Self-pay | Admitting: *Deleted

## 2016-01-05 MED ORDER — INSULIN GLARGINE 100 UNIT/ML SOLOSTAR PEN: 50.0000 [IU] | PEN_INJECTOR | Freq: Two times a day (BID) | SUBCUTANEOUS | 3 refills | Status: DC

## 2016-01-05 NOTE — Telephone Encounter (Signed)
error 

## 2016-01-05 NOTE — Telephone Encounter (Signed)
Pt came in to check on referral that need to be made. Pt is also needing some lantus insulin, and requesting refill [facility]. Verified if he use pen or vials pt states he use pen. Sent rx electronically...Raechel Chute/lmb

## 2016-01-15 ENCOUNTER — Ambulatory Visit (INDEPENDENT_AMBULATORY_CARE_PROVIDER_SITE_OTHER): Payer: Commercial Managed Care - HMO | Admitting: Nurse Practitioner

## 2016-01-15 ENCOUNTER — Encounter (INDEPENDENT_AMBULATORY_CARE_PROVIDER_SITE_OTHER): Payer: Self-pay

## 2016-01-15 ENCOUNTER — Other Ambulatory Visit (INDEPENDENT_AMBULATORY_CARE_PROVIDER_SITE_OTHER): Payer: Commercial Managed Care - HMO

## 2016-01-15 ENCOUNTER — Encounter: Payer: Self-pay | Admitting: Nurse Practitioner

## 2016-01-15 VITALS — HR 80

## 2016-01-15 DIAGNOSIS — R197 Diarrhea, unspecified: Secondary | ICD-10-CM

## 2016-01-15 LAB — BASIC METABOLIC PANEL
BUN: 13 mg/dL (ref 6–23)
CALCIUM: 9.1 mg/dL (ref 8.4–10.5)
CO2: 26 meq/L (ref 19–32)
Chloride: 102 mEq/L (ref 96–112)
Creatinine, Ser: 1.04 mg/dL (ref 0.40–1.50)
GFR: 91.07 mL/min (ref >60.00)
GLUCOSE: 364 mg/dL — AB (ref 70–99)
Potassium: 4.3 mEq/L (ref 3.5–5.1)
SODIUM: 135 meq/L (ref 135–145)

## 2016-01-15 LAB — CBC WITH DIFFERENTIAL/PLATELET
BASOS ABS: 0 10*3/uL (ref 0.0–0.1)
Basophils Relative: 0.3 % (ref 0.0–3.0)
EOS ABS: 0.1 10*3/uL (ref 0.0–0.7)
EOS PCT: 1.2 % (ref 0.0–5.0)
HCT: 43.3 % (ref 39.0–52.0)
Hemoglobin: 14.7 g/dL (ref 13.0–17.0)
LYMPHS PCT: 42.4 % (ref 12.0–46.0)
Lymphs Abs: 3 10*3/uL (ref 0.7–4.0)
MCHC: 33.9 g/dL (ref 30.0–36.0)
MCV: 87.6 fl (ref 78.0–100.0)
MONO ABS: 0.6 10*3/uL (ref 0.1–1.0)
MONOS PCT: 8.2 % (ref 3.0–12.0)
Neutro Abs: 3.4 10*3/uL (ref 1.4–7.7)
Neutrophils Relative %: 47.9 % (ref 43.0–77.0)
Platelets: 203 10*3/uL (ref 150.0–400.0)
RBC: 4.94 Mil/uL (ref 4.22–5.81)
RDW: 14.7 % (ref 11.5–15.5)
WBC: 7.1 10*3/uL (ref 4.0–10.5)

## 2016-01-15 NOTE — Patient Instructions (Addendum)
Please go to the basement level to have your labs drawn.  After the stool test,  Take Imodium 1 tab 3 times daily.   We will call you with the results.

## 2016-01-16 ENCOUNTER — Other Ambulatory Visit: Payer: Commercial Managed Care - HMO

## 2016-01-16 DIAGNOSIS — R197 Diarrhea, unspecified: Secondary | ICD-10-CM

## 2016-01-16 NOTE — Progress Notes (Signed)
     HPI: This is a 70 year old male with multiple medical problems who we met in the hospital mid July. Patient was hospitalized with a right lower quadrant pain, vomiting and loose stools. CT scan showed a partial ileal obstruction. Colonoscopy with pathology consistent with ischemic disease. Patient was discharged home with additional Cipro and Flagyl which he completed.  Patient comes in today for evaluation of diarrhea. The diarrhea never improved from hospital admission in July. He describes large volume, loose stools worse with any solid food. He has some nocturnal stooling. No associated cramps. Stool does not contain blood. He has no nausea or vomiting   Past Medical History:  Diagnosis Date  . Atrial flutter ([place])   . Cataract   . Chronic kidney disease    on Lisinopril to protect kidneys d/t being on Metformin per pt  . Chronic systolic CHF (congestive heart failure) ([place])   . Coronary artery disease    a. s/p remote CABG 2001 at [facility] b. Cath 03/2010: for med rx.;  c.  Carlton Adam myoview (1/14):  Large Inferior apical MI, very small area of peri-infarct ischemia toward the inferior apical segment. EF 19%.  Med Rx was continued.    . Diabetes mellitus 1979   takes Metformni daily  and Lantus 50units bid  . DVT (deep venous thrombosis) ([place]) 2011   legs  . H/O hiatal hernia   . H/O medication noncompliance    Due to insurance issues  . History of blood clots    in legs--this was in 2011--has been off of Coumadin 7month;takes Xarelto daily  . Hyperlipidemia   . Hypotension   . Ischemic cardiomyopathy    a. EF 40% 2010. b. Echo (2/14):  mild LVH, EF 30-35%, diff HK, Gr 1 DD, MAC, mild MR, mod LAE, PASP 39  . Joint pain   . Myocardial infarction    total of 8 heart attacks;last one in 2011  . Paroxysmal atrial fibrillation (HCC)   . [place]ipheral [place]   . [place]ipheral vascular disease [place]    a. s/p L BKA 2014.  .Marland KitchenShortness of breath dyspnea   . Stroke ([facility]      Patient's surgical history, family medical history, social history, and allergies were all reviewed in Epic    Physical Exam: Pulse 80 . Staff unable to obtain blood pressure (not audible)  GENERAL: Pleasant, black male in a wheelchair in NAD PSYCH: :Pleasant, cooperative, normal affect HEENT: Normocephalic, conjunctiva pink, mucous membranes moist, neck supple without masses CARDIAC:   pulse very faint but heard at apex , RRR,  PULM: Normal respiratory effort, lungs CTA bilaterally, no wheezing ABDOMEN:   limited exam in wheelchair . Abdomen soft, obese, nontender, nondistended, normal bowel sounds EXTREMITIES: Bilateral below the knee amputation SKIN:  turgor, no lesions seen NEURO: Alert and oriented x 3, no focal neurologic deficits   ASSESSMENT and PLAN:   70year old male with postprandial loose stool since July when hospitalized with ischemic bowel involving cecum and terminal ileum. No further abdominal pain or vomiting. Patient nontoxic appearing, overall feels okay. Etiology of persistent loose stool unclear.  No med changes upon discharge, other than the cipro  / flagyl for ischemic bowel  -need to check for c-diff -following stool sample patient may try Imodium one TID. I will call him with stool study results.

## 2016-01-17 LAB — CLOSTRIDIUM DIFFICILE BY PCR: CDIFFPCR: NOT DETECTED

## 2016-01-17 NOTE — Progress Notes (Signed)
I agree with the above note, plan 

## 2016-02-22 ENCOUNTER — Inpatient Hospital Stay (HOSPITAL_COMMUNITY): Payer: Commercial Managed Care - HMO

## 2016-02-22 ENCOUNTER — Encounter (HOSPITAL_COMMUNITY)
Admission: EM | Disposition: A | Discharge status: Home / Self Care | Payer: Self-pay | Source: Home / Self Care | Attending: Family Medicine

## 2016-02-22 ENCOUNTER — Emergency Department (HOSPITAL_COMMUNITY): Payer: Commercial Managed Care - HMO

## 2016-02-22 ENCOUNTER — Inpatient Hospital Stay (HOSPITAL_COMMUNITY)
Admission: EM | Admit: 2016-02-22 | Discharge: 2016-02-25 | DRG: 251 | Disposition: A | Discharge status: Home / Self Care | Payer: Commercial Managed Care - HMO | Attending: Family Medicine | Admitting: Family Medicine

## 2016-02-22 ENCOUNTER — Encounter (HOSPITAL_COMMUNITY): Payer: Self-pay | Admitting: Emergency Medicine

## 2016-02-22 DIAGNOSIS — Z9114 Patient's other noncompliance with medication regimen: Secondary | ICD-10-CM | POA: Diagnosis not present

## 2016-02-22 DIAGNOSIS — Z955 Presence of coronary angioplasty implant and graft: Secondary | ICD-10-CM | POA: Diagnosis not present

## 2016-02-22 DIAGNOSIS — I252 Old myocardial infarction: Secondary | ICD-10-CM | POA: Diagnosis not present

## 2016-02-22 DIAGNOSIS — Z8249 Family history of ischemic heart disease and other diseases of the circulatory system: Secondary | ICD-10-CM

## 2016-02-22 DIAGNOSIS — Z794 Long term (current) use of insulin: Secondary | ICD-10-CM

## 2016-02-22 DIAGNOSIS — R0789 Other chest pain: Secondary | ICD-10-CM

## 2016-02-22 DIAGNOSIS — E1151 Type 2 diabetes mellitus with diabetic peripheral angiopathy without gangrene: Secondary | ICD-10-CM | POA: Diagnosis present

## 2016-02-22 DIAGNOSIS — Z86718 Personal history of other venous thrombosis and embolism: Secondary | ICD-10-CM | POA: Diagnosis not present

## 2016-02-22 DIAGNOSIS — Z89511 Acquired absence of right leg below knee: Secondary | ICD-10-CM

## 2016-02-22 DIAGNOSIS — Z87891 Personal history of nicotine dependence: Secondary | ICD-10-CM

## 2016-02-22 DIAGNOSIS — Z91199 Patient's noncompliance with other medical treatment and regimen due to unspecified reason: Secondary | ICD-10-CM

## 2016-02-22 DIAGNOSIS — E1142 Type 2 diabetes mellitus with diabetic polyneuropathy: Secondary | ICD-10-CM | POA: Diagnosis not present

## 2016-02-22 DIAGNOSIS — I10 Essential (primary) hypertension: Secondary | ICD-10-CM | POA: Diagnosis not present

## 2016-02-22 DIAGNOSIS — I4892 Unspecified atrial flutter: Secondary | ICD-10-CM | POA: Diagnosis present

## 2016-02-22 DIAGNOSIS — Z7982 Long term (current) use of aspirin: Secondary | ICD-10-CM | POA: Diagnosis not present

## 2016-02-22 DIAGNOSIS — T508X5A Adverse effect of diagnostic agents, initial encounter: Secondary | ICD-10-CM | POA: Diagnosis not present

## 2016-02-22 DIAGNOSIS — Z23 Encounter for immunization: Secondary | ICD-10-CM | POA: Diagnosis not present

## 2016-02-22 DIAGNOSIS — I2510 Atherosclerotic heart disease of native coronary artery without angina pectoris: Secondary | ICD-10-CM | POA: Diagnosis not present

## 2016-02-22 DIAGNOSIS — Z7901 Long term (current) use of anticoagulants: Secondary | ICD-10-CM

## 2016-02-22 DIAGNOSIS — Z89512 Acquired absence of left leg below knee: Secondary | ICD-10-CM | POA: Diagnosis not present

## 2016-02-22 DIAGNOSIS — Z79899 Other long term (current) drug therapy: Secondary | ICD-10-CM

## 2016-02-22 DIAGNOSIS — Z8673 Personal history of transient ischemic attack (TIA), and cerebral infarction without residual deficits: Secondary | ICD-10-CM

## 2016-02-22 DIAGNOSIS — N289 Disorder of kidney and ureter, unspecified: Secondary | ICD-10-CM | POA: Diagnosis not present

## 2016-02-22 DIAGNOSIS — I5022 Chronic systolic (congestive) heart failure: Secondary | ICD-10-CM | POA: Diagnosis not present

## 2016-02-22 DIAGNOSIS — R0602 Shortness of breath: Secondary | ICD-10-CM

## 2016-02-22 DIAGNOSIS — I2582 Chronic total occlusion of coronary artery: Secondary | ICD-10-CM | POA: Diagnosis present

## 2016-02-22 DIAGNOSIS — E669 Obesity, unspecified: Secondary | ICD-10-CM | POA: Diagnosis present

## 2016-02-22 DIAGNOSIS — I255 Ischemic cardiomyopathy: Secondary | ICD-10-CM | POA: Diagnosis present

## 2016-02-22 DIAGNOSIS — I480 Paroxysmal atrial fibrillation: Secondary | ICD-10-CM | POA: Diagnosis not present

## 2016-02-22 DIAGNOSIS — I214 Non-ST elevation (NSTEMI) myocardial infarction: Principal | ICD-10-CM | POA: Diagnosis present

## 2016-02-22 DIAGNOSIS — K5900 Constipation, unspecified: Secondary | ICD-10-CM | POA: Diagnosis not present

## 2016-02-22 DIAGNOSIS — Z9119 Patient's noncompliance with other medical treatment and regimen: Secondary | ICD-10-CM

## 2016-02-22 DIAGNOSIS — Z6841 Body Mass Index (BMI) 40.0 and over, adult: Secondary | ICD-10-CM | POA: Diagnosis not present

## 2016-02-22 DIAGNOSIS — E785 Hyperlipidemia, unspecified: Secondary | ICD-10-CM | POA: Diagnosis present

## 2016-02-22 DIAGNOSIS — R079 Chest pain, unspecified: Secondary | ICD-10-CM | POA: Diagnosis not present

## 2016-02-22 DIAGNOSIS — I959 Hypotension, unspecified: Secondary | ICD-10-CM | POA: Diagnosis present

## 2016-02-22 DIAGNOSIS — Z951 Presence of aortocoronary bypass graft: Secondary | ICD-10-CM | POA: Diagnosis not present

## 2016-02-22 HISTORY — PX: CARDIAC CATHETERIZATION: SHX172

## 2016-02-22 LAB — COMPREHENSIVE METABOLIC PANEL
ALT: 26 U/L (ref 17–63)
ANION GAP: 8 (ref 5–15)
AST: 29 U/L (ref 15–41)
Albumin: 3.5 g/dL (ref 3.5–5.0)
Alkaline Phosphatase: 107 U/L (ref 38–126)
BILIRUBIN TOTAL: 0.6 mg/dL (ref 0.3–1.2)
BUN: 9 mg/dL (ref 6–20)
CO2: 23 mmol/L (ref 22–32)
Calcium: 8.7 mg/dL — ABNORMAL LOW (ref 8.9–10.3)
Chloride: 102 mmol/L (ref 101–111)
Creatinine, Ser: 1.02 mg/dL (ref 0.61–1.24)
Glucose, Bld: 314 mg/dL — ABNORMAL HIGH (ref 65–99)
POTASSIUM: 3.9 mmol/L (ref 3.5–5.1)
Sodium: 133 mmol/L — ABNORMAL LOW (ref 135–145)
TOTAL PROTEIN: 7 g/dL (ref 6.5–8.1)

## 2016-02-22 LAB — CBG MONITORING, ED: Glucose-Capillary: 328 mg/dL — ABNORMAL HIGH (ref 65–99)

## 2016-02-22 LAB — CBC WITH DIFFERENTIAL/PLATELET
Basophils Absolute: 0 10*3/uL (ref 0.0–0.1)
Basophils Relative: 0 %
EOS PCT: 1 %
Eosinophils Absolute: 0.1 10*3/uL (ref 0.0–0.7)
HEMATOCRIT: 46.4 % (ref 39.0–52.0)
Hemoglobin: 16.2 g/dL (ref 13.0–17.0)
LYMPHS PCT: 38 %
Lymphs Abs: 2.6 10*3/uL (ref 0.7–4.0)
MCH: 29.8 pg (ref 26.0–34.0)
MCHC: 34.9 g/dL (ref 30.0–36.0)
MCV: 85.5 fL (ref 78.0–100.0)
MONO ABS: 0.7 10*3/uL (ref 0.1–1.0)
MONOS PCT: 10 %
NEUTROS ABS: 3.5 10*3/uL (ref 1.7–7.7)
Neutrophils Relative %: 51 %
PLATELETS: 201 10*3/uL (ref 150–400)
RBC: 5.43 MIL/uL (ref 4.22–5.81)
RDW: 13.3 % (ref 11.5–15.5)
WBC: 6.8 10*3/uL (ref 4.0–10.5)

## 2016-02-22 LAB — MRSA PCR SCREENING: MRSA BY PCR: NEGATIVE

## 2016-02-22 LAB — GLUCOSE, CAPILLARY
Glucose-Capillary: 223 mg/dL — ABNORMAL HIGH (ref 65–99)
Glucose-Capillary: 188 mg/dL — ABNORMAL HIGH (ref 65–99)

## 2016-02-22 LAB — TROPONIN I
TROPONIN I: 0.1 ng/mL — AB (ref <0.03)
TROPONIN I: 21.36 ng/mL — AB (ref <0.03)
Troponin I: 3.62 ng/mL (ref <0.03)
Troponin I: 56.86 ng/mL (ref <0.03)

## 2016-02-22 LAB — I-STAT TROPONIN, ED: Troponin i, poc: 0.09 ng/mL (ref 0.00–0.08)

## 2016-02-22 LAB — BRAIN NATRIURETIC PEPTIDE: B NATRIURETIC PEPTIDE 5: 157.6 pg/mL — AB (ref 0.0–100.0)

## 2016-02-22 LAB — PROTIME-INR
INR: 0.97
PROTHROMBIN TIME: 12.9 s (ref 11.4–15.2)

## 2016-02-22 LAB — POCT ACTIVATED CLOTTING TIME: Activated Clotting Time: 467 seconds

## 2016-02-22 SURGERY — LEFT HEART CATH AND CORS/GRAFTS ANGIOGRAPHY

## 2016-02-22 MED ORDER — MIDAZOLAM HCL 2 MG/2ML IJ SOLN
INTRAMUSCULAR | Status: AC
  Filled 2016-02-22: qty 2

## 2016-02-22 MED ORDER — IOPAMIDOL (ISOVUE-370) INJECTION 76%
INTRAVENOUS | Status: AC
  Filled 2016-02-22: qty 100

## 2016-02-22 MED ORDER — IOPAMIDOL (ISOVUE-370) INJECTION 76%
INTRAVENOUS | Status: DC | PRN
  Administered 2016-02-22: 170 mL via INTRA_ARTERIAL

## 2016-02-22 MED ORDER — HEPARIN BOLUS VIA INFUSION
4000.0000 [IU] | Freq: Once | INTRAVENOUS | Status: AC
  Administered 2016-02-22: 4000 [IU] via INTRAVENOUS
  Filled 2016-02-22: qty 4000

## 2016-02-22 MED ORDER — SODIUM CHLORIDE 0.9% FLUSH: 3.0000 mL | Freq: Two times a day (BID) | INTRAVENOUS | Status: DC

## 2016-02-22 MED ORDER — BIVALIRUDIN 250 MG IV SOLR
INTRAVENOUS | Status: AC
  Filled 2016-02-22: qty 250

## 2016-02-22 MED ORDER — HEPARIN (PORCINE) IN NACL 2-0.9 UNIT/ML-% IJ SOLN
INTRAMUSCULAR | Status: AC
  Filled 2016-02-22: qty 1000

## 2016-02-22 MED ORDER — ONDANSETRON HCL 4 MG PO TABS: 4.0000 mg | ORAL_TABLET | Freq: Four times a day (QID) | ORAL | Status: DC | PRN

## 2016-02-22 MED ORDER — INSULIN ASPART 100 UNIT/ML ~~LOC~~ SOLN
0.0000 [IU] | Freq: Three times a day (TID) | SUBCUTANEOUS | Status: DC
  Administered 2016-02-22: 3 [IU] via SUBCUTANEOUS

## 2016-02-22 MED ORDER — NITROGLYCERIN 0.4 MG SL SUBL: 0.4000 mg | SUBLINGUAL_TABLET | SUBLINGUAL | Status: DC | PRN

## 2016-02-22 MED ORDER — SODIUM CHLORIDE 0.9 % IV SOLN
INTRAVENOUS | Status: DC | PRN
  Administered 2016-02-22 (×2): 1.75 mg/kg/h via INTRAVENOUS

## 2016-02-22 MED ORDER — LEVALBUTEROL HCL 1.25 MG/0.5ML IN NEBU
1.2500 mg | INHALATION_SOLUTION | Freq: Four times a day (QID) | RESPIRATORY_TRACT | Status: DC | PRN
  Administered 2016-02-22: 1.25 mg via RESPIRATORY_TRACT
  Filled 2016-02-22: qty 0.5

## 2016-02-22 MED ORDER — INFLUENZA VAC SPLIT QUAD 0.5 ML IM SUSY
0.5000 mL | PREFILLED_SYRINGE | INTRAMUSCULAR | Status: AC
  Administered 2016-02-23: 0.5 mL via INTRAMUSCULAR
  Filled 2016-02-22: qty 0.5

## 2016-02-22 MED ORDER — ACETAMINOPHEN 650 MG RE SUPP: 650.0000 mg | Freq: Four times a day (QID) | RECTAL | Status: DC | PRN

## 2016-02-22 MED ORDER — BIVALIRUDIN BOLUS VIA INFUSION - CUPID
INTRAVENOUS | Status: DC | PRN
  Administered 2016-02-22: 99.975 mg via INTRAVENOUS

## 2016-02-22 MED ORDER — SODIUM CHLORIDE 0.9 % IV SOLN: 250.0000 mL | INTRAVENOUS | Status: DC | PRN

## 2016-02-22 MED ORDER — HEPARIN (PORCINE) IN NACL 100-0.45 UNIT/ML-% IJ SOLN
1500.0000 [IU]/h | INTRAMUSCULAR | Status: DC
  Administered 2016-02-22: 1500 [IU]/h via INTRAVENOUS
  Filled 2016-02-22 (×2): qty 250

## 2016-02-22 MED ORDER — TICAGRELOR 90 MG PO TABS
ORAL_TABLET | ORAL | Status: AC
  Filled 2016-02-22: qty 2

## 2016-02-22 MED ORDER — ASPIRIN 81 MG PO CHEW
81.0000 mg | CHEWABLE_TABLET | Freq: Every day | ORAL | Status: DC
  Administered 2016-02-23 – 2016-02-25 (×3): 81 mg via ORAL
  Filled 2016-02-22 (×3): qty 1

## 2016-02-22 MED ORDER — MORPHINE SULFATE (PF) 4 MG/ML IV SOLN: 2.0000 mg | INTRAVENOUS | Status: DC | PRN

## 2016-02-22 MED ORDER — FENTANYL CITRATE (PF) 100 MCG/2ML IJ SOLN
INTRAMUSCULAR | Status: DC | PRN
  Administered 2016-02-22: 25 ug via INTRAVENOUS

## 2016-02-22 MED ORDER — EZETIMIBE 10 MG PO TABS
10.0000 mg | ORAL_TABLET | Freq: Every day | ORAL | Status: DC
  Administered 2016-02-22 – 2016-02-25 (×4): 10 mg via ORAL
  Filled 2016-02-22 (×4): qty 1

## 2016-02-22 MED ORDER — LIDOCAINE HCL (PF) 1 % IJ SOLN
INTRAMUSCULAR | Status: DC | PRN
  Administered 2016-02-22: 20 mL via SUBCUTANEOUS

## 2016-02-22 MED ORDER — ACETAMINOPHEN 325 MG PO TABS: 650.0000 mg | ORAL_TABLET | Freq: Four times a day (QID) | ORAL | Status: DC | PRN

## 2016-02-22 MED ORDER — ROSUVASTATIN CALCIUM 20 MG PO TABS
20.0000 mg | ORAL_TABLET | Freq: Every day | ORAL | Status: DC
  Administered 2016-02-22 – 2016-02-24 (×3): 20 mg via ORAL
  Filled 2016-02-22: qty 1
  Filled 2016-02-22: qty 2
  Filled 2016-02-22: qty 1

## 2016-02-22 MED ORDER — FENTANYL CITRATE (PF) 100 MCG/2ML IJ SOLN
INTRAMUSCULAR | Status: AC
  Filled 2016-02-22: qty 2

## 2016-02-22 MED ORDER — SODIUM CHLORIDE 0.9% FLUSH: 3.0000 mL | INTRAVENOUS | Status: DC | PRN

## 2016-02-22 MED ORDER — INSULIN GLARGINE 100 UNIT/ML ~~LOC~~ SOLN
50.0000 [IU] | Freq: Two times a day (BID) | SUBCUTANEOUS | Status: DC
  Administered 2016-02-22 – 2016-02-25 (×7): 50 [IU] via SUBCUTANEOUS
  Filled 2016-02-22 (×8): qty 0.5

## 2016-02-22 MED ORDER — NITROGLYCERIN 0.4 MG SL SUBL
0.4000 mg | SUBLINGUAL_TABLET | SUBLINGUAL | Status: DC | PRN
  Administered 2016-02-22: 0.4 mg via SUBLINGUAL
  Filled 2016-02-22: qty 1

## 2016-02-22 MED ORDER — ACETAMINOPHEN 325 MG PO TABS: 650.0000 mg | ORAL_TABLET | ORAL | Status: DC | PRN

## 2016-02-22 MED ORDER — ONDANSETRON HCL 4 MG/2ML IJ SOLN
4.0000 mg | Freq: Four times a day (QID) | INTRAMUSCULAR | Status: DC | PRN
  Administered 2016-02-23: 4 mg via INTRAVENOUS
  Filled 2016-02-22: qty 2

## 2016-02-22 MED ORDER — LIDOCAINE HCL (PF) 1 % IJ SOLN
INTRAMUSCULAR | Status: AC
  Filled 2016-02-22: qty 30

## 2016-02-22 MED ORDER — ASPIRIN EC 81 MG PO TBEC: 81.0000 mg | DELAYED_RELEASE_TABLET | Freq: Every day | ORAL | Status: DC

## 2016-02-22 MED ORDER — ONDANSETRON HCL 4 MG/2ML IJ SOLN: 4.0000 mg | Freq: Four times a day (QID) | INTRAMUSCULAR | Status: DC | PRN

## 2016-02-22 MED ORDER — INSULIN ASPART 100 UNIT/ML ~~LOC~~ SOLN: 0.0000 [IU] | Freq: Every day | SUBCUTANEOUS | Status: DC

## 2016-02-22 MED ORDER — OXYCODONE HCL 5 MG PO TABS: 5.0000 mg | ORAL_TABLET | ORAL | Status: DC | PRN

## 2016-02-22 MED ORDER — TICAGRELOR 90 MG PO TABS
ORAL_TABLET | ORAL | Status: DC | PRN
  Administered 2016-02-22: 180 mg via ORAL

## 2016-02-22 MED ORDER — LISINOPRIL 5 MG PO TABS
5.0000 mg | ORAL_TABLET | Freq: Every day | ORAL | Status: DC
  Administered 2016-02-23 – 2016-02-25 (×2): 5 mg via ORAL
  Filled 2016-02-22: qty 1
  Filled 2016-02-22: qty 2
  Filled 2016-02-22: qty 1

## 2016-02-22 MED ORDER — HEPARIN (PORCINE) IN NACL 2-0.9 UNIT/ML-% IJ SOLN
INTRAMUSCULAR | Status: DC | PRN
  Administered 2016-02-22: 1000 mL

## 2016-02-22 MED ORDER — SODIUM CHLORIDE 0.9 % IV SOLN
INTRAVENOUS | Status: AC
  Administered 2016-02-22: 17:00:00 via INTRAVENOUS

## 2016-02-22 MED ORDER — GI COCKTAIL ~~LOC~~
30.0000 mL | Freq: Four times a day (QID) | ORAL | Status: DC | PRN
  Filled 2016-02-22: qty 30

## 2016-02-22 MED ORDER — INSULIN ASPART 100 UNIT/ML ~~LOC~~ SOLN: 10.0000 [IU] | Freq: Three times a day (TID) | SUBCUTANEOUS | Status: DC | PRN

## 2016-02-22 MED ORDER — TICAGRELOR 90 MG PO TABS
90.0000 mg | ORAL_TABLET | Freq: Two times a day (BID) | ORAL | Status: DC
Start: 2016-02-23 — End: 2016-02-25
  Administered 2016-02-23 – 2016-02-25 (×5): 90 mg via ORAL
  Filled 2016-02-22 (×5): qty 1

## 2016-02-22 MED ORDER — MIDAZOLAM HCL 2 MG/2ML IJ SOLN
INTRAMUSCULAR | Status: DC | PRN
  Administered 2016-02-22: 1 mg via INTRAVENOUS

## 2016-02-22 MED ORDER — SODIUM CHLORIDE 0.9% FLUSH
3.0000 mL | Freq: Two times a day (BID) | INTRAVENOUS | Status: DC
  Administered 2016-02-23 – 2016-02-25 (×4): 3 mL via INTRAVENOUS

## 2016-02-22 MED ORDER — ASPIRIN EC 325 MG PO TBEC: 325.0000 mg | DELAYED_RELEASE_TABLET | Freq: Every day | ORAL | Status: DC

## 2016-02-22 MED ORDER — METOPROLOL SUCCINATE ER 50 MG PO TB24
50.0000 mg | ORAL_TABLET | Freq: Two times a day (BID) | ORAL | Status: DC
  Administered 2016-02-23 – 2016-02-25 (×3): 50 mg via ORAL
  Filled 2016-02-22: qty 2
  Filled 2016-02-22 (×3): qty 1

## 2016-02-22 MED ORDER — NITROGLYCERIN IN D5W 200-5 MCG/ML-% IV SOLN
0.0000 ug/min | INTRAVENOUS | Status: DC
  Administered 2016-02-22: 5 ug/min via INTRAVENOUS
  Filled 2016-02-22: qty 250

## 2016-02-22 SURGICAL SUPPLY — 19 items
BALLN MOZEC 2.0X12 (BALLOONS) ×3
BALLN MOZEC 2.5X41 (BALLOONS) ×1 IMPLANT
BALLOON MOZEC 2.0X12 (BALLOONS) IMPLANT
CATH INFINITI 5 FR IM (CATHETERS) ×2 IMPLANT
CATH INFINITI 5 FR LCB (CATHETERS) ×2 IMPLANT
CATH INFINITI 5FR MULTPACK ANG (CATHETERS) ×2 IMPLANT
GUIDE CATH RUNWAY 6FR CLS3.5 (CATHETERS) ×2 IMPLANT
KIT ENCORE 26 ADVANTAGE (KITS) ×2 IMPLANT
KIT HEART LEFT (KITS) ×3 IMPLANT
PACK CARDIAC CATHETERIZATION (CUSTOM PROCEDURE TRAY) ×3 IMPLANT
SHEATH PINNACLE 6F 10CM (SHEATH) ×2 IMPLANT
TRANSDUCER W/STOPCOCK (MISCELLANEOUS) ×3 IMPLANT
TUBING CIL FLEX 10 FLL-RA (TUBING) ×3 IMPLANT
VALVE GUARDIAN II ~~LOC~~ HEMO (MISCELLANEOUS) ×2 IMPLANT
WIRE ASAHI FIELDER XT 190CM (WIRE) ×2 IMPLANT
WIRE ASAHI MIRACLEBROS-3 180CM (WIRE) ×2 IMPLANT
WIRE ASAHI PROWATER 180CM (WIRE) ×2 IMPLANT
WIRE EMERALD 3MM-J .035X150CM (WIRE) ×2 IMPLANT
WIRE HI TORQ VERSACORE-J 145CM (WIRE) ×2 IMPLANT

## 2016-02-22 NOTE — H&P (Signed)
History and Physical    Leroy GriffithsWilliam J [name] ZOX:[id number] DOB: 18-Nov-1945 DOA: 02/22/2016  PCP: Leroy Lingerhomas Jones, MD Patient coming from: home  Chief Complaint: CP  HPI: Leroy GriffithsWilliam J [name] is a 70 y.o. male with medical history significant of CVA, atrial flutter, CHF, CAD status post CABG, DM, DVT, HLD, hypotension PVD status post bilateral BKA, presenting with right-sided chest pain. Acute onset while resting on the couch watching TV. Constant. Associated with diaphoresis. Described as a swelling pain in his chest. States that from time to time he has reflux symptoms which sometimes feels somewhat similar to his current episode. Relieved with Aspirin and nitroglycerin. Patient states that he took a lisinopril overnight when chest pain started without any relief of pain. Patient is supposed to be taking Xarelto for blood clots but has not taken it in several months due to cost. Chest pain does not radiate to neck or arm is not associated with movement or deep respirations and denies palpitations, shortness of breath, nausea, vomiting, abdominal pain, fevers, cough.   ED Course: NItro w/ relief of CP. Cardiology consulted who deferred admission to Hospitalist  Review of Systems: As per HPI otherwise 10 point review of systems negative.   Ambulatory Status: bilat BKA. Has prosthesis w/ him in room  Past Medical History:  Diagnosis Date  . Atrial flutter (HCC)   . Cataract   . Chronic kidney disease    on Lisinopril to protect kidneys d/t being on Metformin per pt  . Chronic systolic CHF (congestive heart failure) (HCC)   . Coronary artery disease    a. s/p remote CABG 2001 at [facility] b. Cath 03/2010: for med rx.;  c.  Eugenie BirksLexiscan myoview (1/14):  Large Inferior apical MI, very small area of peri-infarct ischemia toward the inferior apical segment. EF 19%.  Med Rx was continued.    . Diabetes mellitus 1979   takes Metformni daily  and Lantus 50units bid  . DVT (deep venous thrombosis) (HCC) 2011   legs    . H/O hiatal hernia   . H/O medication noncompliance    Due to insurance issues  . History of blood clots    in legs--this was in 2011--has been off of Coumadin 903months;takes Xarelto daily  . Hyperlipidemia   . Hypotension   . Ischemic cardiomyopathy    a. EF 40% 2010. b. Echo (2/14):  mild LVH, EF 30-35%, diff HK, Gr 1 DD, MAC, mild MR, mod LAE, PASP 39  . Joint pain   . Myocardial infarction    total of 8 heart attacks;last one in 2011  . Paroxysmal atrial fibrillation (HCC)   . Peripheral neuropathy (HCC)   . Peripheral vascular disease (HCC)    a. s/p L BKA 2014.  [name]. Shortness of breath dyspnea   . Stroke [facility](HCC)     Past Surgical History:  Procedure Laterality Date  . ABDOMINAL AORTAGRAM  04-30-12  . ABDOMINAL AORTAGRAM N/A 04/30/2012   Procedure: ABDOMINAL Ronny FlurryAORTAGRAM;  Surgeon: Fransisco HertzBrian L Chen, MD;  [facility];  Service: Cardiovascular;  Laterality: N/A;  . ADENOIDECTOMY    . AMPUTATION Left 06/03/2012   Procedure: AMPUTATION BELOW KNEE;  Surgeon: Fransisco HertzBrian L Chen, MD;  [facility];  Service: Vascular;  Laterality: Left;  . AMPUTATION Right 04/14/2013   Procedure: AMPUTATION BELOW KNEE ;  Surgeon: Fransisco HertzBrian L Chen, MD;  [facility];  Service: Vascular;  Laterality: Right;  . APPLICATION OF WOUND VAC Right 04/20/2014   Procedure: APPLICATION OF WOUND VAC;  Surgeon: [name], MD;  Location: [facility] OR;  Service: Vascular;  Laterality: Right;  . CARDIAC CATHETERIZATION  03/19/10  . COLONOSCOPY WITH PROPOFOL N/A 10/24/2015   Procedure: COLONOSCOPY WITH PROPOFOL;  Surgeon: Rachael Fee, MD;  Location: [facility] ENDOSCOPY;  Service: Endoscopy;  Laterality: N/A;  . CORONARY ANGIOPLASTY  2002   3 stents  . CORONARY ARTERY BYPASS GRAFT  03/2000   quadruple  . ENDARTERECTOMY FEMORAL Right 03/25/2013   Procedure: ENDARTERECTOMY FEMORAL ;  Surgeon: [name], MD;  [facility];  Service: Vascular;  Laterality: Right;  . EYE SURGERY Right    Catarct  . HERNIA REPAIR     Hiatal  Hernia  . I&D EXTREMITY Right 04/14/2013   Procedure: IRRIGATION AND DEBRIDEMENT OF RIGHT  GROIN & PLACEMENT OF VAC DRESSING;  Surgeon: [name], MD;  Location: [facility] OR;  Service: Vascular;  Laterality: Right;  . I&D EXTREMITY Right 04/20/2014   Procedure: DEBRIDEMENT OF RIGHT BELOW KNEE AMPUTATION STUMP;  Surgeon: [name], MD;  Location: [facility] OR;  Service: Vascular;  Laterality: Right;  . LOWER EXTREMITY ANGIOGRAM Bilateral 04/30/2012   Procedure: LOWER EXTREMITY ANGIOGRAM;  Surgeon: [name], MD;  Location[facility] LAB;  Service: Cardiovascular;  Laterality: Bilateral;  bilat lower extrem angio  . PATCH ANGIOPLASTY Right 03/25/2013   Procedure: PATCH ANGIOPLASTY USING VASCU-GUARD PERIPHERAL VASCULAR PATCH;  Surgeon: [name], MD;  Location: [facility] OR;  Service: Vascular;  Laterality: Right;  . revasculariztion  2011/2010   x 2   . TONSILLECTOMY    . WOUND DEBRIDEMENT Right 04/20/2014   s/p  rt bka  with application of wound vac    Social History   Social History  . Marital status: Legally Separated    Spouse name: N/A  . Number of children: N/A  . Years of education: N/A   Occupational History  . Not on file.   Social History Main Topics  . Smoking status: Former Smoker    Types: Cigarettes  . Smokeless tobacco: Never Used     Comment: quit smoking 22yrs ago  . Alcohol use 0.0 oz/week     Comment: occasionally  . Drug use: No  . Sexual activity: Not Currently   Other Topics Concern  . Not on file   Social History Narrative  . No narrative on file    Allergies  Allergen Reactions  . Statins Other (See Comments)    Reaction:  Muscle pain   . Amlodipine Besylate Other (See Comments)    Reaction:  Muscle pain   . Cephalexin Hives    Family History  Problem Relation Age of Onset  . Heart disease Mother     before age 18  . Diabetes Mother   . Heart attack Mother   . Stroke Mother   . Heart disease Father   . Colon cancer Neg Hx   . Stomach cancer Neg Hx    . Pancreatic cancer Neg Hx   . Esophageal cancer Neg Hx     Prior to Admission medications   Medication Sig Start Date End Date Taking? Authorizing Provider  aspirin EC 81 MG tablet Take 81 mg by mouth daily.   Yes Historical Provider, MD  Canagliflozin-Metformin HCl 50-1000 MG TABS Take 1 tablet by mouth 2 (two) times daily. 11/27/15  Yes Etta Grandchild, MD  ezetimibe (ZETIA) 10 MG tablet Take 1 tablet (10 mg total) by mouth daily. 05/07/13  Yes Mcarthur Rossetti Angiulli, PA-C  insulin aspart (NOVOLOG) 100 UNIT/ML injection Inject 10 Units into the skin 3 (three) times daily as needed for high blood sugar (BLOOD SUGARS GREATER THAN 200).    Yes Historical Provider, MD  Insulin Glargine (LANTUS) 100 UNIT/ML Solostar Pen Inject 50 Units into the skin 2 (two) times daily. 01/05/16  Yes Etta Grandchild, MD  lisinopril (PRINIVIL,ZESTRIL) 5 MG tablet Take 1 tablet (5 mg total) by mouth daily. 11/27/15  Yes Etta Grandchild, MD  metoprolol succinate (TOPROL-XL) 50 MG 24 hr tablet Take 1 tablet (50 mg total) by mouth 2 (two) times daily before a meal. 11/27/15  Yes Etta Grandchild, MD  nitroGLYCERIN (NITROSTAT) 0.4 MG SL tablet Place 1 tablet (0.4 mg total) under the tongue every 5 (five) minutes as needed for chest pain. 05/07/13  Yes Daniel J Angiulli, PA-C  rivaroxaban (XARELTO) 20 MG TABS tablet Take 1 tablet (20 mg total) by mouth daily at 6 PM. 12/18/15  Yes Etta Grandchild, MD  rosuvastatin (CRESTOR) 20 MG tablet Take 1 tablet (20 mg total) by mouth at bedtime. 11/27/15  Yes Etta Grandchild, MD  Insulin Pen Needle (B-D UF III MINI PEN NEEDLES) 31G X 5 MM MISC Use to inject insulin as directed. 11/27/15   Etta Grandchild, MD    Physical Exam: Vitals:   02/22/16 0630 02/22/16 0645 02/22/16 0700 02/22/16 0739  BP: 158/96 133/79 (!) 152/114 122/66  Pulse: 94 93 91 92  Resp: 24 21 25 22   Temp:      TempSrc:      SpO2: 98% 96% 98% 99%     General:  Appears calm and comfortable Eyes:  PERRL, EOMI, normal lids,  iris ENT:  grossly normal hearing, poor dentition, mmm Neck:  no LAD, masses or thyromegaly Cardiovascular:  RRR, no m/r/g. No LE edema.  Respiratory:  CTA bilaterally, no w/r/r. Normal respiratory effort. Abdomen:  soft, ntnd, NABS Skin:  no rash or induration seen on limited exam Musculoskeletal: B LE amputation, grossly normal tone BUE/BLE, good ROM,  Psychiatric:  grossly normal mood and affect, speech fluent and appropriate, AOx3 Neurologic:  CN 2-12 grossly intact, moves all extremities in coordinated fashion, sensation intact  Labs on Admission: I have personally reviewed following labs and imaging studies  CBC:  Recent Labs Lab 02/22/16 0403  WBC 6.8  NEUTROABS 3.5  HGB 16.2  HCT 46.4  MCV 85.5  PLT 201   Basic Metabolic Panel:  Recent Labs Lab 02/22/16 0403  NA 133*  K 3.9  CL 102  CO2 23  GLUCOSE 314*  BUN 9  CREATININE 1.02  CALCIUM 8.7*   GFR: CrCl cannot be calculated (Unknown ideal weight.). Liver Function Tests:  Recent Labs Lab 02/22/16 0403  AST 29  ALT 26  ALKPHOS 107  BILITOT 0.6  PROT 7.0  ALBUMIN 3.5   No results for input(s): LIPASE, AMYLASE in the last 168 hours. No results for input(s): AMMONIA in the last 168 hours. Coagulation Profile:  Recent Labs Lab 02/22/16 0403  INR 0.97   Cardiac Enzymes:  Recent Labs Lab 02/22/16 0403  TROPONINI 0.10*   BNP (last 3 results) No results for input(s): PROBNP in the last 8760 hours. HbA1C: No results for input(s): HGBA1C in the last 72 hours. CBG: No results for input(s): GLUCAP in the last 168 hours. Lipid Profile: No results for input(s): CHOL, HDL, LDLCALC, TRIG, CHOLHDL, LDLDIRECT in the last 72 hours. Thyroid Function Tests: No results for input(s): TSH, T4TOTAL, FREET4,  T3FREE, THYROIDAB in the last 72 hours. Anemia Panel: No results for input(s): VITAMINB12, FOLATE, FERRITIN, TIBC, IRON, RETICCTPCT in the last 72 hours. Urine analysis:    Component Value Date/Time     COLORURINE YELLOW 11/13/2015 1148   APPEARANCEUR CLEAR 11/13/2015 1148   LABSPEC <=1.005 (A) 11/13/2015 1148   PHURINE 6.0 11/13/2015 1148   GLUCOSEU NEGATIVE 11/13/2015 1148   HGBUR SMALL (A) 11/13/2015 1148   BILIRUBINUR NEGATIVE 11/13/2015 1148   KETONESUR NEGATIVE 11/13/2015 1148   PROTEINUR 100 (A) 10/22/2015 2336   UROBILINOGEN 0.2 11/13/2015 1148   NITRITE NEGATIVE 11/13/2015 1148   LEUKOCYTESUR NEGATIVE 11/13/2015 1148    Creatinine Clearance: CrCl cannot be calculated (Unknown ideal weight.).  Sepsis Labs: @LABRCNTIP (procalcitonin:4,lacticidven:4) )No results found for this or any previous visit (from the past 240 hour(s)).   Radiological Exams on Admission: Dg Chest Portable 1 View  Result Date: 02/22/2016 CLINICAL DATA:  Chest pain tonight EXAM: PORTABLE CHEST 1 VIEW COMPARISON:  11/13/2015 FINDINGS: A single AP portable view of the chest demonstrates no focal airspace consolidation or alveolar edema. The lungs are grossly clear. There is no large effusion or pneumothorax. Cardiac and mediastinal contours appear unremarkable. IMPRESSION: No active disease. Electronically Signed   By: Ellery Plunk M.D.   On: 02/22/2016 04:51    EKG: Independently reviewed. Sinus, LBBB  Assessment/Plan Principal Problem:   Chest pain, atypical Active Problems:   ATRIAL FIBRILLATION, PAROXYSMAL   S/P BKA (below knee amputation) bilateral (HCC)   Chronic systolic heart failure (HCC)   Diabetes mellitus type 2 with peripheral artery disease (HCC)   Essential hypertension   CP: Cardiac vs GI vs pleuritic. H/o MI/CABGD. EKG as above. Trop 0.1 BNP 157. Dr. Anne Fu of cardiology consulted upon initial eval by ED and their team will follow. Associated w/ diaphoresis though pt states pain is somewhat similar to his reflux - cycle trop - tele - ASA, Nitro - f/u Cards recs - Myoview vs cath - continue heparin for now - GI cocktail if returns - continue statin  HTN: Took  lisinopril at onset of pain w/o relief. Has not taken any other meds for 3 days. Elevated in ED - resume pts metoprolol, Lisinopril  DM: - SSI - continue Lantus  Afib: - continue metoprolol - resume Xarelto after cardiac workup and when off Hep drip  H/o CVA: no residual deficits - continue ASA, Statin  Bilat BKA: 2014 and 2015. At baseline. Ambulates w/ prosthesis.  - PT/OT if prolonged stay in hospital  DVT prophylaxis: Heparin drip  Code Status: full  Family Communication: none  Disposition Plan: pending cp r/o  Consults called: Cardiology  Admission status: observation    Bernis Schreur J MD [facility]  If 7PM-7AM, please contact night-coverage [email or web address] Password [facility]  02/22/2016, 8:46 AM

## 2016-02-22 NOTE — ED Notes (Signed)
MD Konrad Dolores as well as Cards notified of patient Troponin 21.36.

## 2016-02-22 NOTE — ED Notes (Signed)
Paged MD Konrad DoloresMerrell regarding patient troponin 3.62, significant increase since last completed this morning

## 2016-02-22 NOTE — Consult Note (Signed)
Cardiology Consult    Patient ID: [name] MRN: [id number], DOB/AGE: 12/04/1945   Admit date: 02/22/2016 Date of Consult: 02/22/2016  Primary Physician: [name], MD  Primary Cardiologist: Has seen Hochrein remotely   Patient Profile    [name] is a 70 year old male with a past medical history of CKD (although creatinine normal today), atrial flutter previously anticoagulated with Xarelto but patient is not taking due to cost, DM, bilateral BKA, CVA, ischemic cardiomyopathy last EF was 25-30 % in Feb. 2017, CAD s/p CABG in 2001 in [place], last cath was 2011 had patent grafts to LAD and circumflex, graft occluded to RCA.   History of Present Illness    Mr. [name] was laying in bed last night and developed chest pain associated with nausea. He has had MI's in the past, and says that "they have all felt different". He describes the pain as tightness. Denies radiation to arm or jaw. Pain lasted all night, he has not taken anything for it. EMS was called.   His initial troponin in the ED was 0.10, subsequent was 3.62. His EKG showed NSR with LBBB, with T wave inversion. This is similar to his previous EKG's. He is still having chest tightness at the time of my encounter.   He has a history of CAD. Last heart cath was in 03/2010 at which time it appeared 2/3 grafts were patent.   He has a history of afib/flutter, but has not been taking any anticoagulation due to cost reasons.   He has a family history of CAD, his mother had multiple MI's.   Past Medical History       Past Medical History:  Diagnosis Date  . Atrial flutter (HCC)   . Cataract   . Chronic kidney disease    on Lisinopril to protect kidneys d/t being on Metformin per pt  . Chronic systolic CHF (congestive heart failure) (HCC)   . Coronary artery disease    a. s/p remote CABG 2001 at [facility] b. Cath 03/2010: for med rx.;  c.  [name] myoview (1/14):  Large Inferior apical MI, very small  area of peri-infarct ischemia toward the inferior apical segment. EF 19%.  Med Rx was continued.    . Diabetes mellitus 1979   takes Metformni daily  and Lantus 50units bid  . DVT (deep venous thrombosis) (HCC) 2011   legs  . H/O hiatal hernia   . H/O medication noncompliance    Due to insurance issues  . History of blood clots    in legs--this was in 2011--has been off of Coumadin 63months;takes Xarelto daily  . Hyperlipidemia   . Hypotension   . Ischemic cardiomyopathy    a. EF 40% 2010. b. Echo (2/14):  mild LVH, EF 30-35%, diff HK, Gr 1 DD, MAC, mild MR, mod LAE, PASP 39  . Joint pain   . Myocardial infarction    total of 8 heart attacks;last one in 2011  . Paroxysmal atrial fibrillation (HCC)   . Peripheral neuropathy (HCC)   . Peripheral vascular disease (HCC)    a. s/p L BKA 2014.  [name] Shortness of breath dyspnea   . Stroke [facility])          Past Surgical History:  Procedure Laterality Date  . ABDOMINAL AORTAGRAM  04-30-12  . ABDOMINAL AORTAGRAM N/A 04/30/2012   Procedure: ABDOMINAL Ronny Flurry;  Surgeon: [name], MD;  Location[facility] C[facility] LAB;  Service: Cardiovascular;  Laterality: N/A;  . ADENOIDECTOMY    .  AMPUTATION Left 06/03/2012   Procedure: AMPUTATION BELOW KNEE;  Surgeon: Fransisco HertzBrian L Chen, MD;  [facility];  Service: Vascular;  Laterality: Left;  . AMPUTATION Right 04/14/2013   Procedure: AMPUTATION BELOW KNEE ;  Surgeon: Fransisco HertzBrian L Chen, MD;  [facility];  Service: Vascular;  Laterality: Right;  . APPLICATION OF WOUND VAC Right 04/20/2014   Procedure: APPLICATION OF WOUND VAC;  Surgeon: Fransisco HertzBrian L Chen, MD;  [facility];  Service: Vascular;  Laterality: Right;  . CARDIAC CATHETERIZATION  03/19/10  . COLONOSCOPY WITH PROPOFOL N/A 10/24/2015   Procedure: COLONOSCOPY WITH PROPOFOL;  Surgeon: Rachael Feeaniel P Jacobs, MD;  [facility] ENDOSCOPY;  Service: Endoscopy;  Laterality: N/A;  . CORONARY ANGIOPLASTY  2002   3 stents  . CORONARY ARTERY BYPASS  GRAFT  03/2000   quadruple  . ENDARTERECTOMY FEMORAL Right 03/25/2013   Procedure: ENDARTERECTOMY FEMORAL ;  Surgeon: Fransisco HertzBrian L Chen, MD;  [facility];  Service: Vascular;  Laterality: Right;  . EYE SURGERY Right    Catarct  . HERNIA REPAIR     Hiatal Hernia  . I&D EXTREMITY Right 04/14/2013   Procedure: IRRIGATION AND DEBRIDEMENT OF RIGHT  GROIN & PLACEMENT OF VAC DRESSING;  Surgeon: Fransisco HertzBrian L Chen, MD;  [facility];  Service: Vascular;  Laterality: Right;  . I&D EXTREMITY Right 04/20/2014   Procedure: DEBRIDEMENT OF RIGHT BELOW KNEE AMPUTATION STUMP;  Surgeon: Fransisco HertzBrian L Chen, MD;  [facility];  Service: Vascular;  Laterality: Right;  . LOWER EXTREMITY ANGIOGRAM Bilateral 04/30/2012   Procedure: LOWER EXTREMITY ANGIOGRAM;  Surgeon: Fransisco HertzBrian L Chen, MD;  [facility];  Service: Cardiovascular;  Laterality: Bilateral;  bilat lower extrem angio  . PATCH ANGIOPLASTY Right 03/25/2013   Procedure: PATCH ANGIOPLASTY USING VASCU-GUARD PERIPHERAL VASCULAR PATCH;  Surgeon: Fransisco HertzBrian L Chen, MD;  [facility];  Service: Vascular;  Laterality: Right;  . revasculariztion  2011/2010   x 2   . TONSILLECTOMY    . WOUND DEBRIDEMENT Right 04/20/2014   s/p  rt bka  with application of wound vac     Allergies       Allergies  Allergen Reactions  . Statins Other (See Comments)    Reaction:  Muscle pain   . Amlodipine Besylate Other (See Comments)    Reaction:  Muscle pain   . Cephalexin Hives    Inpatient Medications    . aspirin EC  325 mg Oral Daily  . insulin aspart  0-5 Units Subcutaneous QHS  . insulin aspart  0-9 Units Subcutaneous TID WC  . insulin glargine  50 Units Subcutaneous BID  . lisinopril  5 mg Oral Daily  . metoprolol succinate  50 mg Oral BID AC  . rosuvastatin  20 mg Oral QHS  . sodium chloride flush  3 mL Intravenous Q12H  . sodium chloride flush  3 mL Intravenous Q12H    Family History    Family History  Problem Relation Age of  Onset  . Heart disease Mother     before age 69  . Diabetes Mother   . Heart attack Mother   . Stroke Mother   . Heart disease Father   . Colon cancer Neg Hx   . Stomach cancer Neg Hx   . Pancreatic cancer Neg Hx   . Esophageal cancer Neg Hx     Social History    Social History        Social History  . Marital status: Legally Separated  Spouse name: N/A  . Number of children: N/A  . Years of education: N/A      Occupational History  . Not on file.         Social History Main Topics  . Smoking status: Former Smoker    Types: Cigarettes  . Smokeless tobacco: Never Used     Comment: quit smoking 75yrs ago  . Alcohol use 0.0 oz/week     Comment: occasionally  . Drug use: No  . Sexual activity: Not Currently       Other Topics Concern  . Not on file      Social History Narrative  . No narrative on file     Review of Systems    General:  No chills, fever, night sweats or weight changes.  Cardiovascular:  + chest pain, dyspnea on exertion, edema, orthopnea, palpitations, paroxysmal nocturnal dyspnea. Dermatological: No rash, lesions/masses Respiratory: No cough, dyspnea Urologic: No hematuria, dysuria Abdominal:   No nausea, vomiting, diarrhea, bright red blood per rectum, melena, or hematemesis Neurologic:  No visual changes, wkns, changes in mental status. All other systems reviewed and are otherwise negative except as noted above.  Physical Exam    Blood pressure 125/85, pulse 89, temperature 97.6 F (36.4 C), temperature source Oral, resp. rate 14, SpO2 96 %.  General: Pleasant, NAD Psych: Normal affect. Neuro: Alert and oriented X 3. Moves all extremities spontaneously. HEENT: Normal           Neck: Supple without bruits or JVD. Lungs:  Resp regular and unlabored, CTA. Heart: RRR no s3, s4, or murmurs. Abdomen: Soft, non-tender, non-distended, BS + x 4.  Extremities: No clubbing, cyanosis or edema.   Labs      Troponin (Point of Care Test)  Recent Labs (last 2 labs)    Recent Labs  02/22/16 0450  TROPIPOC 0.09*      Recent Labs (last 2 labs)    Recent Labs  02/22/16 0403 02/22/16 0843  TROPONINI 0.10* 3.62*     Recent Labs       Lab Results  Component Value Date   WBC 6.8 02/22/2016   HGB 16.2 02/22/2016   HCT 46.4 02/22/2016   MCV 85.5 02/22/2016   PLT 201 02/22/2016      Recent Labs Lab 02/22/16 0403  NA 133*  K 3.9  CL 102  CO2 23  BUN 9  CREATININE 1.02  CALCIUM 8.7*  PROT 7.0  BILITOT 0.6  ALKPHOS 107  ALT 26  AST 29  GLUCOSE 314*   Recent Labs       Lab Results  Component Value Date   CHOL 209 (H) 05/23/2015   HDL 45 05/23/2015   LDLCALC 142 (H) 05/23/2015   TRIG 108 05/23/2015     Recent Labs  No results found for: [facility]     Radiology Studies     Imaging Results  Dg Chest Portable 1 View  Result Date: 02/22/2016 CLINICAL DATA:  Chest pain tonight EXAM: PORTABLE CHEST 1 VIEW COMPARISON:  11/13/2015 FINDINGS: A single AP portable view of the chest demonstrates no focal airspace consolidation or alveolar edema. The lungs are grossly clear. There is no large effusion or pneumothorax. Cardiac and mediastinal contours appear unremarkable. IMPRESSION: No active disease. Electronically Signed   By: Ellery Plunk M.D.   On: 02/22/2016 04:51     EKG & Cardiac Imaging    EKG: NSR, LBBB, with lateral t wave inversion  Echocardiogram: pending.   Assessment &  Plan    1. NSTEMI: Presented with chest pain that began while he was sleeping. Initial troponin was negative, subsequent was 3.62. Will add Nitro gtt for his continued chest tightness, he appears comfortable now. Denies nausea, SOB and diaphoresis.   Will plan for heart cath today to assess for ischemia.   2. History of CAD s/p CABG in 2000  3. DM: On Metformin at home, A1C pending  4. History of CKD: normal creatinine currently.   5. Ischemic  cardiomyopathy: EF was 25-30% in February of this year. We need to optimize his medications, will get case management involved as he has trouble paying for his meds. Would use Coreg with low EF, would like to add [employer].   Signed, Little Ishikawa, NP 02/22/2016, 10:52 AM Pager: [phone]      I have personally seen and examined this patient with Suzzette Righter, NP. I agree with the assessment and plan as outlined above. He is known to have CAD with prior CABG. Last cath December 2011 with patent vein graft to OM branches and patent LIMA graft to LAD. RCA is occluded without a visible patent graft. He is known to have a LBBB. He is admitted today with chest pressure. EKG without acute changes, reviewed by me. Troponin is elevated at 3.62. My exam shows an obese AAM in NAD. CV:RRR. Lungs: scattered end exp wheezes. Abd: soft. Ext: bilateral BKA.  CAD/NSTEMI: Plan cath today. Continue IV heparin. Start IV NTG. Of note, he has not taken his prescribed anticoagulation for at least a month.   Verne Carrow 02/22/2016 11:46 AM

## 2016-02-22 NOTE — Progress Notes (Signed)
This is a no charge note  Pending admission per Dr. Manus Gunningancour  70 year old male with past medical history of diabetes mellitus, hyperlipidemia, stroke, PVD, atrial fibrillation, CAD, DVT, systolic congestive heart failure, who presents with chest pain and diaphoresis. Troponin elevated at 0.1. BNP 157. EKG showed ST elevation in V1-V3, and diffused ST depression.  Cardiology, Dr. Anne FuSkains  was consulted-->does not think this is STEMI. IV heparin started. Pt is accepted to tele bed as inpt.  Lorretta HarpXilin Canary Fister, MD  [facility] Pager [phone]  If 7PM-7AM, please contact night-coverage [email or web address] Password [email or web address] 02/22/2016, 6:54 AM

## 2016-02-22 NOTE — ED Triage Notes (Signed)
Per EMS, sudden onset of chest pain while at rest, beginning an hour and a half ago. Pt received 324 aspirin PTA. BP-177/94, HR-105,SpO2-96% ra, CBG-232

## 2016-02-22 NOTE — ED Notes (Signed)
Dr. Manus Gunning explained tests results and admission plan to pt. , No chest at this time , respirations unlabored , IV site intact .

## 2016-02-22 NOTE — ED Notes (Signed)
IV team at bedside for second IV. 

## 2016-02-22 NOTE — ED Notes (Signed)
Cardiology notified of patient hypotension with nitroglycerin in the 60's. Pt appears lethargic, but easily arousable. IV team at bedside to place second line.

## 2016-02-22 NOTE — H&P (Deleted)
Cardiology Consult    Patient ID: Tina GriffithsWilliam J Gertz MRN: [id number], DOB/AGE: [birth year]   Admit date: 02/22/2016 Date of Consult: 02/22/2016  Primary Physician: [name], MD  Primary Cardiologist: Has seen Hochrein remotely   Patient Profile    Mr. Kayren Eavesrby is a 70 year old male with a past medical history of CKD (although creatinine normal today), atrial flutter previously anticoagulated with Xarelto but patient is not taking due to cost, DM, bilateral BKA, CVA, ischemic cardiomyopathy last EF was 25-30 % in Feb. 2017, CAD s/p CABG in 2001 in [place], last cath was 2011 had patent grafts to LAD and circumflex, graft occluded to RCA.   History of Present Illness    Mr. Kayren Eavesrby was laying in bed last night and developed chest pain associated with nausea. He has had MI's in the past, and says that "they have all felt different". He describes the pain as tightness. Denies radiation to arm or jaw. Pain lasted all night, he has not taken anything for it. EMS was called.   His initial troponin in the ED was 0.10, subsequent was 3.62. His EKG showed NSR with LBBB, with T wave inversion. This is similar to his previous EKG's. He is still having chest tightness at the time of my encounter.   He has a history of CAD. Last heart cath was in 03/2010 at which time it appeared 2/3 grafts were patent.   He has a history of afib/flutter, but has not been taking any anticoagulation due to cost reasons.   He has a family history of CAD, his mother had multiple MI's.   Past Medical History   Past Medical History:  Diagnosis Date  . Atrial flutter (HCC)   . Cataract   . Chronic kidney disease    on Lisinopril to protect kidneys d/t being on Metformin per pt  . Chronic systolic CHF (congestive heart failure) (HCC)   . Coronary artery disease    a. s/p remote CABG 2001 at [facility] b. Cath 03/2010: for med rx.;  c.  Eugenie BirksLexiscan myoview (1/14):  Large Inferior apical MI, very small area of peri-infarct  ischemia toward the inferior apical segment. EF 19%.  Med Rx was continued.    . Diabetes mellitus 1979   takes Metformni daily  and Lantus 50units bid  . DVT (deep venous thrombosis) (HCC) 2011   legs  . H/O hiatal hernia   . H/O medication noncompliance    Due to insurance issues  . History of blood clots    in legs--this was in 2011--has been off of Coumadin 543months;takes Xarelto daily  . Hyperlipidemia   . Hypotension   . Ischemic cardiomyopathy    a. EF 40% 2010. b. Echo (2/14):  mild LVH, EF 30-35%, diff HK, Gr 1 DD, MAC, mild MR, mod LAE, PASP 39  . Joint pain   . Myocardial infarction    total of 8 heart attacks;last one in 2011  . Paroxysmal atrial fibrillation (HCC)   . Peripheral neuropathy (HCC)   . Peripheral vascular disease (HCC)    a. s/p L BKA 2014.  [name]. Shortness of breath dyspnea   . Stroke [facility](HCC)     Past Surgical History:  Procedure Laterality Date  . ABDOMINAL AORTAGRAM  04-30-12  . ABDOMINAL AORTAGRAM N/A 04/30/2012   Procedure: ABDOMINAL Ronny FlurryAORTAGRAM;  Surgeon: Fransisco HertzBrian L Chen, MD;  [facility];  Service: Cardiovascular;  Laterality: N/A;  . ADENOIDECTOMY    . AMPUTATION Left 06/03/2012  Procedure: AMPUTATION BELOW KNEE;  Surgeon: [name], MD;  Location: [facility] OR;  Service: Vascular;  Laterality: Left;  . AMPUTATION Right 04/14/2013   Procedure: AMPUTATION BELOW KNEE ;  Surgeon: [name], MD;  Location: [facility] OR;  Service: Vascular;  Laterality: Right;  . APPLICATION OF WOUND VAC Right 04/20/2014   Procedure: APPLICATION OF WOUND VAC;  Surgeon: [name], MD;  Location: [facility] OR;  Service: Vascular;  Laterality: Right;  . CARDIAC CATHETERIZATION  03/19/10  . COLONOSCOPY WITH PROPOFOL N/A 10/24/2015   Procedure: COLONOSCOPY WITH PROPOFOL;  Surgeon: Rachael Fee, MD;  Location: [facility] ENDOSCOPY;  Service: Endoscopy;  Laterality: N/A;  . CORONARY ANGIOPLASTY  2002   3 stents  . CORONARY ARTERY BYPASS GRAFT  03/2000   quadruple  . ENDARTERECTOMY FEMORAL  Right 03/25/2013   Procedure: ENDARTERECTOMY FEMORAL ;  Surgeon: [name], MD;  Location: [facility] OR;  Service: Vascular;  Laterality: Right;  . EYE SURGERY Right    Catarct  . HERNIA REPAIR     Hiatal Hernia  . I&D EXTREMITY Right 04/14/2013   Procedure: IRRIGATION AND DEBRIDEMENT OF RIGHT  GROIN & PLACEMENT OF VAC DRESSING;  Surgeon: [name], MD;  Location: [facility] OR;  Service: Vascular;  Laterality: Right;  . I&D EXTREMITY Right 04/20/2014   Procedure: DEBRIDEMENT OF RIGHT BELOW KNEE AMPUTATION STUMP;  Surgeon: [name], MD;  Location: [facility] OR;  Service: Vascular;  Laterality: Right;  . LOWER EXTREMITY ANGIOGRAM Bilateral 04/30/2012   Procedure: LOWER EXTREMITY ANGIOGRAM;  Surgeon: [name], MD;  Location: [facility] CATH LAB;  Service: Cardiovascular;  Laterality: Bilateral;  bilat lower extrem angio  . PATCH ANGIOPLASTY Right 03/25/2013   Procedure: PATCH ANGIOPLASTY USING VASCU-GUARD PERIPHERAL VASCULAR PATCH;  Surgeon: [name], MD;  Location: [facility] OR;  Service: Vascular;  Laterality: Right;  . revasculariztion  2011/2010   x 2   . TONSILLECTOMY    . WOUND DEBRIDEMENT Right 04/20/2014   s/p  rt bka  with application of wound vac     Allergies  Allergies  Allergen Reactions  . Statins Other (See Comments)    Reaction:  Muscle pain   . Amlodipine Besylate Other (See Comments)    Reaction:  Muscle pain   . Cephalexin Hives    Inpatient Medications    . aspirin EC  325 mg Oral Daily  . insulin aspart  0-5 Units Subcutaneous QHS  . insulin aspart  0-9 Units Subcutaneous TID WC  . insulin glargine  50 Units Subcutaneous BID  . lisinopril  5 mg Oral Daily  . metoprolol succinate  50 mg Oral BID AC  . rosuvastatin  20 mg Oral QHS  . sodium chloride flush  3 mL Intravenous Q12H  . sodium chloride flush  3 mL Intravenous Q12H    Family History    Family History  Problem Relation Age of Onset  . Heart disease Mother     before age 30  . Diabetes Mother   . Heart attack  Mother   . Stroke Mother   . Heart disease Father   . Colon cancer Neg Hx   . Stomach cancer Neg Hx   . Pancreatic cancer Neg Hx   . Esophageal cancer Neg Hx     Social History    Social History   Social History  . Marital status: Legally Separated    Spouse name: N/A  . Number of children: N/A  . Years of  education: N/A   Occupational History  . Not on file.   Social History Main Topics  . Smoking status: Former Smoker    Types: Cigarettes  . Smokeless tobacco: Never Used     Comment: quit smoking 70yrs ago  . Alcohol use 0.0 oz/week     Comment: occasionally  . Drug use: No  . Sexual activity: Not Currently   Other Topics Concern  . Not on file   Social History Narrative  . No narrative on file     Review of Systems    General:  No chills, fever, night sweats or weight changes.  Cardiovascular:  + chest pain, dyspnea on exertion, edema, orthopnea, palpitations, paroxysmal nocturnal dyspnea. Dermatological: No rash, lesions/masses Respiratory: No cough, dyspnea Urologic: No hematuria, dysuria Abdominal:   No nausea, vomiting, diarrhea, bright red blood per rectum, melena, or hematemesis Neurologic:  No visual changes, wkns, changes in mental status. All other systems reviewed and are otherwise negative except as noted above.  Physical Exam    Blood pressure 125/85, pulse 89, temperature 97.6 F (36.4 C), temperature source Oral, resp. rate 14, SpO2 96 %.  General: Pleasant, NAD Psych: Normal affect. Neuro: Alert and oriented X 3. Moves all extremities spontaneously. HEENT: Normal  Neck: Supple without bruits or JVD. Lungs:  Resp regular and unlabored, CTA. Heart: RRR no s3, s4, or murmurs. Abdomen: Soft, non-tender, non-distended, BS + x 4.  Extremities: No clubbing, cyanosis or edema.   Labs    Troponin [facility] of Care Test)  Recent Labs  02/22/16 0450  TROPIPOC 0.09*    Recent Labs  02/22/16 0403 02/22/16 0843  TROPONINI 0.10* 3.62*    Lab Results  Component Value Date   WBC 6.8 02/22/2016   HGB 16.2 02/22/2016   HCT 46.4 02/22/2016   MCV 85.5 02/22/2016   PLT 201 02/22/2016    Recent Labs Lab 02/22/16 0403  NA 133*  K 3.9  CL 102  CO2 23  BUN 9  CREATININE 1.02  CALCIUM 8.7*  PROT 7.0  BILITOT 0.6  ALKPHOS 107  ALT 26  AST 29  GLUCOSE 314*   Lab Results  Component Value Date   CHOL 209 (H) 05/23/2015   HDL 45 05/23/2015   LDLCALC 142 (H) 05/23/2015   TRIG 108 05/23/2015   No results found for: [facility]   Radiology Studies    Dg Chest Portable 1 View  Result Date: 02/22/2016 CLINICAL DATA:  Chest pain tonight EXAM: PORTABLE CHEST 1 VIEW COMPARISON:  11/13/2015 FINDINGS: A single AP portable view of the chest demonstrates no focal airspace consolidation or alveolar edema. The lungs are grossly clear. There is no large effusion or pneumothorax. Cardiac and mediastinal contours appear unremarkable. IMPRESSION: No active disease. Electronically Signed   By: Ellery Plunk M.D.   On: 02/22/2016 04:51    EKG & Cardiac Imaging    EKG: NSR, LBBB, with lateral t wave inversion  Echocardiogram: pending.   Assessment & Plan    1. NSTEMI: Presented with chest pain that began while he was sleeping. Initial troponin was negative, subsequent was 3.62. Will add Nitro gtt for his continued chest tightness, he appears comfortable now. Denies nausea, SOB and diaphoresis.   Will plan for heart cath today to assess for ischemia.   2. History of CAD s/p CABG in 2000  3. DM: On Metformin at home, A1C pending  4. History of CKD: normal creatinine currently.   5. Ischemic cardiomyopathy: EF was 25-30% in  February of this year. We need to optimize his medications, will get case management involved as he has trouble paying for his meds. Would use Coreg with low EF, would like to add [employer].   Signed, Little Ishikawa, NP 02/22/2016, 10:52 AM Pager: [phone]

## 2016-02-22 NOTE — H&P (View-Only) (Signed)
Cardiology Consult    Patient ID: DEXTON [name] MRN: [id number], DOB/AGE: 12/04/1945   Admit date: 02/22/2016 Date of Consult: 02/22/2016  Primary Physician: [name], MD  Primary Cardiologist: Has seen Hochrein remotely   Patient Profile    [name] is a 70 year old male with a past medical history of CKD (although creatinine normal today), atrial flutter previously anticoagulated with Xarelto but patient is not taking due to cost, DM, bilateral BKA, CVA, ischemic cardiomyopathy last EF was 25-30 % in Feb. 2017, CAD s/p CABG in 2001 in [place], last cath was 2011 had patent grafts to LAD and circumflex, graft occluded to RCA.   History of Present Illness    [name] was laying in bed last night and developed chest pain associated with nausea. He has had MI's in the past, and says that "they have all felt different". He describes the pain as tightness. Denies radiation to arm or jaw. Pain lasted all night, he has not taken anything for it. EMS was called.   His initial troponin in the ED was 0.10, subsequent was 3.62. His EKG showed NSR with LBBB, with T wave inversion. This is similar to his previous EKG's. He is still having chest tightness at the time of my encounter.   He has a history of CAD. Last heart cath was in 03/2010 at which time it appeared 2/3 grafts were patent.   He has a history of afib/flutter, but has not been taking any anticoagulation due to cost reasons.   He has a family history of CAD, his mother had multiple MI's.   Past Medical History       Past Medical History:  Diagnosis Date  . Atrial flutter (HCC)   . Cataract   . Chronic kidney disease    on Lisinopril to protect kidneys d/t being on Metformin per pt  . Chronic systolic CHF (congestive heart failure) (HCC)   . Coronary artery disease    a. s/p remote CABG 2001 at [facility] b. Cath 03/2010: for med rx.;  c.  [name] myoview (1/14):  Large Inferior apical MI, very small  area of peri-infarct ischemia toward the inferior apical segment. EF 19%.  Med Rx was continued.    . Diabetes mellitus 1979   takes Metformni daily  and Lantus 50units bid  . DVT (deep venous thrombosis) (HCC) 2011   legs  . H/O hiatal hernia   . H/O medication noncompliance    Due to insurance issues  . History of blood clots    in legs--this was in 2011--has been off of Coumadin 63months;takes Xarelto daily  . Hyperlipidemia   . Hypotension   . Ischemic cardiomyopathy    a. EF 40% 2010. b. Echo (2/14):  mild LVH, EF 30-35%, diff HK, Gr 1 DD, MAC, mild MR, mod LAE, PASP 39  . Joint pain   . Myocardial infarction    total of 8 heart attacks;last one in 2011  . Paroxysmal atrial fibrillation (HCC)   . Peripheral neuropathy (HCC)   . Peripheral vascular disease (HCC)    a. s/p L BKA 2014.  [name] Shortness of breath dyspnea   . Stroke [facility])          Past Surgical History:  Procedure Laterality Date  . ABDOMINAL AORTAGRAM  04-30-12  . ABDOMINAL AORTAGRAM N/A 04/30/2012   Procedure: ABDOMINAL Ronny Flurry;  Surgeon: [name], MD;  Location[facility] C[facility] LAB;  Service: Cardiovascular;  Laterality: N/A;  . ADENOIDECTOMY    .  AMPUTATION Left 06/03/2012   Procedure: AMPUTATION BELOW KNEE;  Surgeon: Fransisco HertzBrian L Chen, MD;  [facility];  Service: Vascular;  Laterality: Left;  . AMPUTATION Right 04/14/2013   Procedure: AMPUTATION BELOW KNEE ;  Surgeon: Fransisco HertzBrian L Chen, MD;  [facility];  Service: Vascular;  Laterality: Right;  . APPLICATION OF WOUND VAC Right 04/20/2014   Procedure: APPLICATION OF WOUND VAC;  Surgeon: Fransisco HertzBrian L Chen, MD;  [facility];  Service: Vascular;  Laterality: Right;  . CARDIAC CATHETERIZATION  03/19/10  . COLONOSCOPY WITH PROPOFOL N/A 10/24/2015   Procedure: COLONOSCOPY WITH PROPOFOL;  Surgeon: Rachael Feeaniel P Jacobs, MD;  Location: [facility] ENDOSCOPY;  Service: Endoscopy;  Laterality: N/A;  . CORONARY ANGIOPLASTY  2002   3 stents  . CORONARY ARTERY BYPASS  GRAFT  03/2000   quadruple  . ENDARTERECTOMY FEMORAL Right 03/25/2013   Procedure: ENDARTERECTOMY FEMORAL ;  Surgeon: Fransisco HertzBrian L Chen, MD;  [facility];  Service: Vascular;  Laterality: Right;  . EYE SURGERY Right    Catarct  . HERNIA REPAIR     Hiatal Hernia  . I&D EXTREMITY Right 04/14/2013   Procedure: IRRIGATION AND DEBRIDEMENT OF RIGHT  GROIN & PLACEMENT OF VAC DRESSING;  Surgeon: Fransisco HertzBrian L Chen, MD;  [facility];  Service: Vascular;  Laterality: Right;  . I&D EXTREMITY Right 04/20/2014   Procedure: DEBRIDEMENT OF RIGHT BELOW KNEE AMPUTATION STUMP;  Surgeon: Fransisco HertzBrian L Chen, MD;  [facility];  Service: Vascular;  Laterality: Right;  . LOWER EXTREMITY ANGIOGRAM Bilateral 04/30/2012   Procedure: LOWER EXTREMITY ANGIOGRAM;  Surgeon: Fransisco HertzBrian L Chen, MD;  [facility];  Service: Cardiovascular;  Laterality: Bilateral;  bilat lower extrem angio  . PATCH ANGIOPLASTY Right 03/25/2013   Procedure: PATCH ANGIOPLASTY USING VASCU-GUARD PERIPHERAL VASCULAR PATCH;  Surgeon: Fransisco HertzBrian L Chen, MD;  [facility];  Service: Vascular;  Laterality: Right;  . revasculariztion  2011/2010   x 2   . TONSILLECTOMY    . WOUND DEBRIDEMENT Right 04/20/2014   s/p  rt bka  with application of wound vac     Allergies       Allergies  Allergen Reactions  . Statins Other (See Comments)    Reaction:  Muscle pain   . Amlodipine Besylate Other (See Comments)    Reaction:  Muscle pain   . Cephalexin Hives    Inpatient Medications    . aspirin EC  325 mg Oral Daily  . insulin aspart  0-5 Units Subcutaneous QHS  . insulin aspart  0-9 Units Subcutaneous TID WC  . insulin glargine  50 Units Subcutaneous BID  . lisinopril  5 mg Oral Daily  . metoprolol succinate  50 mg Oral BID AC  . rosuvastatin  20 mg Oral QHS  . sodium chloride flush  3 mL Intravenous Q12H  . sodium chloride flush  3 mL Intravenous Q12H    Family History    Family History  Problem Relation Age of  Onset  . Heart disease Mother     before age 69  . Diabetes Mother   . Heart attack Mother   . Stroke Mother   . Heart disease Father   . Colon cancer Neg Hx   . Stomach cancer Neg Hx   . Pancreatic cancer Neg Hx   . Esophageal cancer Neg Hx     Social History    Social History        Social History  . Marital status: Legally Separated  Spouse name: N/A  . Number of children: N/A  . Years of education: N/A      Occupational History  . Not on file.         Social History Main Topics  . Smoking status: Former Smoker    Types: Cigarettes  . Smokeless tobacco: Never Used     Comment: quit smoking 75yrs ago  . Alcohol use 0.0 oz/week     Comment: occasionally  . Drug use: No  . Sexual activity: Not Currently       Other Topics Concern  . Not on file      Social History Narrative  . No narrative on file     Review of Systems    General:  No chills, fever, night sweats or weight changes.  Cardiovascular:  + chest pain, dyspnea on exertion, edema, orthopnea, palpitations, paroxysmal nocturnal dyspnea. Dermatological: No rash, lesions/masses Respiratory: No cough, dyspnea Urologic: No hematuria, dysuria Abdominal:   No nausea, vomiting, diarrhea, bright red blood per rectum, melena, or hematemesis Neurologic:  No visual changes, wkns, changes in mental status. All other systems reviewed and are otherwise negative except as noted above.  Physical Exam    Blood pressure 125/85, pulse 89, temperature 97.6 F (36.4 C), temperature source Oral, resp. rate 14, SpO2 96 %.  General: Pleasant, NAD Psych: Normal affect. Neuro: Alert and oriented X 3. Moves all extremities spontaneously. HEENT: Normal           Neck: Supple without bruits or JVD. Lungs:  Resp regular and unlabored, CTA. Heart: RRR no s3, s4, or murmurs. Abdomen: Soft, non-tender, non-distended, BS + x 4.  Extremities: No clubbing, cyanosis or edema.   Labs      Troponin (Point of Care Test)  Recent Labs (last 2 labs)    Recent Labs  02/22/16 0450  TROPIPOC 0.09*      Recent Labs (last 2 labs)    Recent Labs  02/22/16 0403 02/22/16 0843  TROPONINI 0.10* 3.62*     Recent Labs       Lab Results  Component Value Date   WBC 6.8 02/22/2016   HGB 16.2 02/22/2016   HCT 46.4 02/22/2016   MCV 85.5 02/22/2016   PLT 201 02/22/2016      Recent Labs Lab 02/22/16 0403  NA 133*  K 3.9  CL 102  CO2 23  BUN 9  CREATININE 1.02  CALCIUM 8.7*  PROT 7.0  BILITOT 0.6  ALKPHOS 107  ALT 26  AST 29  GLUCOSE 314*   Recent Labs       Lab Results  Component Value Date   CHOL 209 (H) 05/23/2015   HDL 45 05/23/2015   LDLCALC 142 (H) 05/23/2015   TRIG 108 05/23/2015     Recent Labs  No results found for: [facility]     Radiology Studies     Imaging Results  Dg Chest Portable 1 View  Result Date: 02/22/2016 CLINICAL DATA:  Chest pain tonight EXAM: PORTABLE CHEST 1 VIEW COMPARISON:  11/13/2015 FINDINGS: A single AP portable view of the chest demonstrates no focal airspace consolidation or alveolar edema. The lungs are grossly clear. There is no large effusion or pneumothorax. Cardiac and mediastinal contours appear unremarkable. IMPRESSION: No active disease. Electronically Signed   By: Ellery Plunk M.D.   On: 02/22/2016 04:51     EKG & Cardiac Imaging    EKG: NSR, LBBB, with lateral t wave inversion  Echocardiogram: pending.   Assessment &  Plan    1. NSTEMI: Presented with chest pain that began while he was sleeping. Initial troponin was negative, subsequent was 3.62. Will add Nitro gtt for his continued chest tightness, he appears comfortable now. Denies nausea, SOB and diaphoresis.   Will plan for heart cath today to assess for ischemia.   2. History of CAD s/p CABG in 2000  3. DM: On Metformin at home, A1C pending  4. History of CKD: normal creatinine currently.   5. Ischemic  cardiomyopathy: EF was 25-30% in February of this year. We need to optimize his medications, will get case management involved as he has trouble paying for his meds. Would use Coreg with low EF, would like to add [employer].   Signed, Little Ishikawa, NP 02/22/2016, 10:52 AM Pager: [phone]      I have personally seen and examined this patient with Suzzette Righter, NP. I agree with the assessment and plan as outlined above. He is known to have CAD with prior CABG. Last cath December 2011 with patent vein graft to OM branches and patent LIMA graft to LAD. RCA is occluded without a visible patent graft. He is known to have a LBBB. He is admitted today with chest pressure. EKG without acute changes, reviewed by me. Troponin is elevated at 3.62. My exam shows an obese AAM in NAD. CV:RRR. Lungs: scattered end exp wheezes. Abd: soft. Ext: bilateral BKA.  CAD/NSTEMI: Plan cath today. Continue IV heparin. Start IV NTG. Of note, he has not taken his prescribed anticoagulation for at least a month.   Verne Carrow 02/22/2016 11:46 AM

## 2016-02-22 NOTE — ED Notes (Signed)
Pt continues to report no chest pain. Family member at bedside.

## 2016-02-22 NOTE — ED Notes (Signed)
Dr. Manus Gunning notified on pt.'s elevated Troponin.

## 2016-02-22 NOTE — Progress Notes (Signed)
ANTICOAGULATION CONSULT NOTE - Initial Consult  Pharmacy Consult for heparin  Indication: chest pain/ACS  Allergies  Allergen Reactions  . Statins Other (See Comments)    Reaction:  Muscle pain   . Amlodipine Besylate Other (See Comments)    Reaction:  Muscle pain   . Cephalexin Hives    Patient Measurements:   Wt 130 kg   Vital Signs: Temp: 97.7 F (36.5 C) (11/16 0409) Temp Source: Oral (11/16 0409) BP: 137/69 (11/16 0530) Pulse Rate: 96 (11/16 0530)  Labs:  Recent Labs  02/22/16 0403  HGB 16.2  HCT 46.4  PLT 201  LABPROT 12.9  INR 0.97  CREATININE 1.02  TROPONINI 0.10*    CrCl cannot be calculated (Unknown ideal weight.).   Medical History: Past Medical History:  Diagnosis Date  . Atrial flutter (HCC)   . Cataract   . Chronic kidney disease    on Lisinopril to protect kidneys d/t being on Metformin per pt  . Chronic systolic CHF (congestive heart failure) (HCC)   . Coronary artery disease    a. s/p remote CABG 2001 at [facility] b. Cath 03/2010: for med rx.;  c.  [name] myoview (1/14):  Large Inferior apical MI, very small area of peri-infarct ischemia toward the inferior apical segment. EF 19%.  Med Rx was continued.    . Diabetes mellitus 1979   takes Metformni daily  and Lantus 50units bid  . DVT (deep venous thrombosis) (HCC) 2011   legs  . H/O hiatal hernia   . H/O medication noncompliance    Due to insurance issues  . History of blood clots    in legs--this was in 2011--has been off of Coumadin 45months;takes Xarelto daily  . Hyperlipidemia   . Hypotension   . Ischemic cardiomyopathy    a. EF 40% 2010. b. Echo (2/14):  mild LVH, EF 30-35%, diff HK, Gr 1 DD, MAC, mild MR, mod LAE, PASP 39  . Joint pain   . Myocardial infarction    total of 8 heart attacks;last one in 2011  . Paroxysmal atrial fibrillation (HCC)   . Peripheral neuropathy (HCC)   . Peripheral vascular disease (HCC)    a. s/p L BKA 2014.  [name] Shortness of breath dyspnea   .  Stroke [facility])     Assessment: 70 yo M admitted 11/16 with CP.  PTA was on Xarelto for atrial fibrillation and history of DVT but has not taken in over a month. CBC stable.    Goal of Therapy:   Heparin level 0.3-0.7 units/ml Monitor platelets by anticoagulation protocol: Yes    Plan:  1. Give 4000 units bolus x 1 2. Start heparin infusion at 1500 units/hr 3. Check anti-Xa level in 6 hours and daily while on heparin 4. Continue to monitor H&H and platelets  Pollyann Samples, PharmD, BCPS 02/22/2016, 5:43 AM Pager: [phone]

## 2016-02-22 NOTE — ED Notes (Signed)
Shown tropi stat to nurse BOBBY RN

## 2016-02-22 NOTE — ED Notes (Signed)
[facility] attempted IV stick X2. Pt reports needing ultrasound for IV sticks.

## 2016-02-22 NOTE — ED Provider Notes (Signed)
[facility]-EMERGENCY DEPT Provider Note   CSN: [id number] Arrival date & time: 02/22/16  9532   By signing my name below, I, Freida Busman, attest that this documentation has been prepared under the direction and in the presence of Glynn Octave, MD . Electronically Signed: Freida Busman, Scribe. 02/22/2016. 4:15 AM.   History   Chief Complaint Chief Complaint  Patient presents with  . Chest Pain     The history is provided by the patient. No language interpreter was used.    HPI Comments:  ARSENIO PAO is a 70 y.o. male with an extensive PMHx including MI, coronary stents, and CABG, who presents to the Emergency Department complaining of constant, non-radiating right sided CP x a few hours. He was watching TV at time of onset. No modifying factors. Pt notes associated diaphoresis. He reports h/o similar pain several years ago. He denies nausea, vomiting, abdominal pain, back pain, and SOB. Pt was given 324 mg ASA PTA with mild relief. He rates his pain a 4/10 at this time. Pt states he is supposed to be on xarelto but has not taken it in a month as he cannot afford it.   Past Medical History:  Diagnosis Date  . Atrial flutter (HCC)   . Cataract   . Chronic kidney disease    on Lisinopril to protect kidneys d/t being on Metformin per pt  . Chronic systolic CHF (congestive heart failure) (HCC)   . Coronary artery disease    a. s/p remote CABG 2001 at [facility] b. Cath 03/2010: for med rx.;  c.  [name] myoview (1/14):  Large Inferior apical MI, very small area of peri-infarct ischemia toward the inferior apical segment. EF 19%.  Med Rx was continued.    . Diabetes mellitus 1979   takes Metformni daily  and Lantus 50units bid  . DVT (deep venous thrombosis) (HCC) 2011   legs  . H/O hiatal hernia   . H/O medication noncompliance    Due to insurance issues  . History of blood clots    in legs--this was in 2011--has been off of Coumadin 25months;takes Xarelto daily  . Hyperlipidemia    . Hypotension   . Ischemic cardiomyopathy    a. EF 40% 2010. b. Echo (2/14):  mild LVH, EF 30-35%, diff HK, Gr 1 DD, MAC, mild MR, mod LAE, PASP 39  . Joint pain   . Myocardial infarction    total of 8 heart attacks;last one in 2011  . Paroxysmal atrial fibrillation (HCC)   . Peripheral neuropathy (HCC)   . Peripheral vascular disease (HCC)    a. s/p L BKA 2014.  [name] Shortness of breath dyspnea   . Stroke [facility])     Patient Active Problem List   Diagnosis Date Noted  . Ischemic colitis (HCC) 10/25/2015  . Pain, abdominal, RLQ 10/23/2015  . Hemispheric carotid artery syndrome   . Coronary artery disease involving coronary bypass graft of native heart with unspecified angina pectoris   . PVD (peripheral vascular disease) (HCC)   . 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 Diabetes mellitus type 2 with peripheral artery disease (HCC) 05/22/2015  . 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 Microalbuminuria 09/29/2014  . 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 Phantom limb pain (HCC) 08/16/2013  . Pain in limb- Right stump 07/06/2013  . Atherosclerosis of native arteries of the extremities with ulceration (HCC) 06/25/2013  . S/P bilateral BKA (below knee amputation) (HCC) 04/21/2013  . Chronic systolic  heart failure (HCC) 04/15/2013  . Femoral artery stenosis (HCC) 03/25/2013  . S/P BKA (below knee amputation) bilateral (HCC) 09/10/2012  . SVT (supraventricular tachycardia) (HCC) 09/02/2012  . Hyperlipidemia with target LDL less than 70 01/02/2012  . ATRIAL FIBRILLATION, PAROXYSMAL 04/13/2010    Past Surgical History:  Procedure Laterality Date  . ABDOMINAL AORTAGRAM  04-30-12  . ABDOMINAL AORTAGRAM N/A 04/30/2012   Procedure: ABDOMINAL Ronny Flurry;  Surgeon: [name], MD;  Location: [facility] CATH LAB;  Service: Cardiovascular;  Laterality: N/A;  . ADENOIDECTOMY    . AMPUTATION Left 06/03/2012   Procedure: AMPUTATION BELOW KNEE;  Surgeon: [name], MD;  Location[facility]  OR;  Service: Vascular;  Laterality: Left;  . AMPUTATION Right 04/14/2013   Procedure: AMPUTATION BELOW KNEE ;  Surgeon: [name], MD;  Location: [facility] OR;  Service: Vascular;  Laterality: Right;  . APPLICATION OF WOUND VAC Right 04/20/2014   Procedure: APPLICATION OF WOUND VAC;  Surgeon: [name], MD;  Location: [facility] OR;  Service: Vascular;  Laterality: Right;  . CARDIAC CATHETERIZATION  03/19/10  . COLONOSCOPY WITH PROPOFOL N/A 10/24/2015   Procedure: COLONOSCOPY WITH PROPOFOL;  Surgeon: Rachael Fee, MD;  Location: [facility] ENDOSCOPY;  Service: Endoscopy;  Laterality: N/A;  . CORONARY ANGIOPLASTY  2002   3 stents  . CORONARY ARTERY BYPASS GRAFT  03/2000   quadruple  . ENDARTERECTOMY FEMORAL Right 03/25/2013   Procedure: ENDARTERECTOMY FEMORAL ;  Surgeon: [name], MD;  Location: [facility] OR;  Service: Vascular;  Laterality: Right;  . EYE SURGERY Right    Catarct  . HERNIA REPAIR     Hiatal Hernia  . I&D EXTREMITY Right 04/14/2013   Procedure: IRRIGATION AND DEBRIDEMENT OF RIGHT  GROIN & PLACEMENT OF VAC DRESSING;  Surgeon: [name], MD;  Location: [facility] OR;  Service: Vascular;  Laterality: Right;  . I&D EXTREMITY Right 04/20/2014   Procedure: DEBRIDEMENT OF RIGHT BELOW KNEE AMPUTATION STUMP;  Surgeon: [name], MD;  Location: [facility] OR;  Service: Vascular;  Laterality: Right;  . LOWER EXTREMITY ANGIOGRAM Bilateral 04/30/2012   Procedure: LOWER EXTREMITY ANGIOGRAM;  Surgeon: [name], MD;  Location: [facility] CATH LAB;  Service: Cardiovascular;  Laterality: Bilateral;  bilat lower extrem angio  . PATCH ANGIOPLASTY Right 03/25/2013   Procedure: PATCH ANGIOPLASTY USING VASCU-GUARD PERIPHERAL VASCULAR PATCH;  Surgeon: [name], MD;  Location: [facility] OR;  Service: Vascular;  Laterality: Right;  . revasculariztion  2011/2010   x 2   . TONSILLECTOMY    . WOUND DEBRIDEMENT Right 04/20/2014   s/p  rt bka  with application of wound vac       Home Medications    Prior to Admission medications     Medication Sig Start Date End Date Taking? Authorizing Provider  aspirin EC 81 MG tablet Take 81 mg by mouth daily.    Historical Provider, MD  Canagliflozin-Metformin HCl 50-1000 MG TABS Take 1 tablet by mouth 2 (two) times daily. 11/27/15   Etta Grandchild, MD  ezetimibe (ZETIA) 10 MG tablet Take 1 tablet (10 mg total) by mouth daily. 05/07/13   Mcarthur Rossetti Angiulli, PA-C  insulin aspart (NOVOLOG) 100 UNIT/ML injection Inject 10 Units into the skin 3 (three) times daily as needed for high blood sugar (BLOOD SUGARS GREATER THAN 200).     Historical Provider, MD  Insulin Glargine (LANTUS) 100 UNIT/ML Solostar Pen Inject 50 Units into the skin 2 (two) times daily. 01/05/16   Bernadene Bell  Yetta BarreJones, MD  Insulin Pen Needle (B-D UF III MINI PEN NEEDLES) 31G X 5 MM MISC Use to inject insulin as directed. 11/27/15   Etta Grandchildhomas L Jones, MD  lisinopril (PRINIVIL,ZESTRIL) 5 MG tablet Take 1 tablet (5 mg total) by mouth daily. 11/27/15   Etta Grandchildhomas L Jones, MD  metoprolol succinate (TOPROL-XL) 50 MG 24 hr tablet Take 1 tablet (50 mg total) by mouth 2 (two) times daily before a meal. 11/27/15   Etta Grandchildhomas L Jones, MD  nitroGLYCERIN (NITROSTAT) 0.4 MG SL tablet Place 1 tablet (0.4 mg total) under the tongue every 5 (five) minutes as needed for chest pain. 05/07/13   Mcarthur Rossettianiel J Angiulli, PA-C  rivaroxaban (XARELTO) 20 MG TABS tablet Take 1 tablet (20 mg total) by mouth daily at 6 PM. 12/18/15   Etta Grandchildhomas L Jones, MD  rosuvastatin (CRESTOR) 20 MG tablet Take 1 tablet (20 mg total) by mouth at bedtime. 11/27/15   Etta Grandchildhomas L Jones, MD    Family History Family History  Problem Relation Age of Onset  . Heart disease Mother     before age 69  . Diabetes Mother   . Heart attack Mother   . Stroke Mother   . Heart disease Father   . Colon cancer Neg Hx   . Stomach cancer Neg Hx   . Pancreatic cancer Neg Hx   . Esophageal cancer Neg Hx     Social History Social History  Substance Use Topics  . Smoking status: Former Smoker    Types: Cigarettes   . Smokeless tobacco: Never Used     Comment: quit smoking 803yrs ago  . Alcohol use 0.0 oz/week     Comment: occasionally     Allergies   Statins; Amlodipine besylate; and Cephalexin   Review of Systems Review of Systems  10 systems reviewed and all are negative for acute change except as noted in the HPI.   Physical Exam Updated Vital Signs BP (!) 152/114   Pulse 91   Temp 97.7 F (36.5 C) (Oral)   Resp 25   SpO2 98%   Physical Exam  Constitutional: He is oriented to person, place, and time. He appears well-developed and well-nourished. No distress.  HENT:  Head: Normocephalic and atraumatic.  Mouth/Throat: Oropharynx is clear and moist. No oropharyngeal exudate.  Eyes: Conjunctivae and EOM are normal. Pupils are equal, round, and reactive to light.  Neck: Normal range of motion. Neck supple.  No meningismus.  Cardiovascular: Normal rate, regular rhythm, normal heart sounds and intact distal pulses.   No murmur heard. Pulmonary/Chest: Effort normal and breath sounds normal. No respiratory distress. He exhibits no tenderness.  Abdominal: Soft. There is no tenderness. There is no rebound and no guarding.  Musculoskeletal: Normal range of motion. He exhibits no edema or tenderness.  Bilateral BKA Intact radial pulses and grip strength  Neurological: He is alert and oriented to person, place, and time. No cranial nerve deficit. He exhibits normal muscle tone. Coordination normal.  No ataxia on finger to nose bilaterally. No pronator drift. 5/5 strength throughout. CN 2-12 intact.Equal grip strength. Sensation intact.   Skin: Skin is warm.  Well healed sternotomy scar  Psychiatric: He has a normal mood and affect. His behavior is normal.  Nursing note and vitals reviewed.   ED Treatments / Results  DIAGNOSTIC STUDIES:  Oxygen Saturation is 93% on RA, adequate by my interpretation.    COORDINATION OF CARE:  4:13 AM Discussed treatment plan with pt at bedside and pt  agreed to plan.  Labs (all labs ordered are listed, but only abnormal results are displayed) Labs Reviewed  COMPREHENSIVE METABOLIC PANEL - Abnormal; Notable for the following:       Result Value   Sodium 133 (*)    Glucose, Bld 314 (*)    Calcium 8.7 (*)    All other components within normal limits  TROPONIN I - Abnormal; Notable for the following:    Troponin I 0.10 (*)    All other components within normal limits  BRAIN NATRIURETIC PEPTIDE - Abnormal; Notable for the following:    B Natriuretic Peptide 157.6 (*)    All other components within normal limits  I-STAT TROPOININ, ED - Abnormal; Notable for the following:    Troponin i, poc 0.09 (*)    All other components within normal limits  CBC WITH DIFFERENTIAL/PLATELET  PROTIME-INR  HEPARIN LEVEL (UNFRACTIONATED)    EKG  EKG Interpretation  Date/Time:  Thursday February 22 2016 04:04:04 EST Ventricular Rate:  104 PR Interval:    QRS Duration: 126 QT Interval:  357 QTC Calculation: 470 R Axis:   32 Text Interpretation:  Sinus tachycardia Left bundle branch block similar to previous does not meet Sgarbossa criteria. d/w Dr. Anne Fu Confirmed by Manus Gunning  MD, Jaques Mineer [id number]) on 02/22/2016 4:34:50 AM       Radiology Dg Chest Portable 1 View  Result Date: 02/22/2016 CLINICAL DATA:  Chest pain tonight EXAM: PORTABLE CHEST 1 VIEW COMPARISON:  11/13/2015 FINDINGS: A single AP portable view of the chest demonstrates no focal airspace consolidation or alveolar edema. The lungs are grossly clear. There is no large effusion or pneumothorax. Cardiac and mediastinal contours appear unremarkable. IMPRESSION: No active disease. Electronically Signed   By: Ellery Plunk M.D.   On: 02/22/2016 04:51    Procedures Procedures (including critical care time)  Medications Ordered in ED Medications - No data to display   Initial Impression / Assessment and Plan / ED Course  I have reviewed the triage vital signs and the nursing  notes.  Pertinent labs & imaging results that were available during my care of the patient were reviewed by me and considered in my medical decision making (see chart for details).  Clinical Course   vasculopath With episode of right-sided chest pain associated with shortness of breath that onset at rest. Remote history of CABG. EKG shows left bundle branch block with ST elevation in V1 and V2 worse than previous. This does not meet Sgarbossa criteria. Discussed with Dr. Anne Fu.  Patient chest pain-free after 2 nitroglycerin in the ED. Troponin minimally elevated 0.09.  D/w Dr. Anne Fu who requests hospitalist admission.  Patient remains chest pain-free. No shortness of breath. Troponin minimally elevated. Patient started on IV heparin.  Admission d/w Dr. Clyde Lundborg.  CRITICAL CARE Performed by: Glynn Octave Total critical care time: 35 minutes Critical care time was exclusive of separately billable procedures and treating other patients. Critical care was necessary to treat or prevent imminent or life-threatening deterioration. Critical care was time spent personally by me on the following activities: development of treatment plan with patient and/or surrogate as well as nursing, discussions with consultants, evaluation of patient's response to treatment, examination of patient, obtaining history from patient or surrogate, ordering and performing treatments and interventions, ordering and review of laboratory studies, ordering and review of radiographic studies, pulse oximetry and re-evaluation of patient's condition.     Final Clinical Impressions(s) / ED Diagnoses   Final diagnoses:  NSTEMI (non-ST elevated myocardial infarction) (  HCC)    New Prescriptions New Prescriptions   No medications on file   I personally performed the services described in this documentation, which was scribed in my presence. The recorded information has been reviewed and is accurate.    Glynn Octave,  MD 02/22/16 [phone]

## 2016-02-22 NOTE — Interval H&P Note (Signed)
Cath Lab Visit (complete for each Cath Lab visit)  Clinical Evaluation Leading to the Procedure:   ACS: Yes.    Non-ACS:    Anginal Classification: CCS IV  Anti-ischemic medical therapy: Minimal Therapy (1 class of medications)  Non-Invasive Test Results: No non-invasive testing performed  Prior CABG: Previous CABG      History and Physical Interval Note:  02/22/2016 3:12 PM  Leroy [name]  has presented today for surgery, with the diagnosis of cp  The various methods of treatment have been discussed with the patient and family. After consideration of risks, benefits and other options for treatment, the patient has consented to  Procedure(s): Left Heart Cath and Cors/Grafts Angiography (N/A) as a surgical intervention .  The patient's history has been reviewed, patient examined, no change in status, stable for surgery.  I have reviewed the patient's chart and labs.  Questions were answered to the patient's satisfaction.     Lance Muss

## 2016-02-22 NOTE — ED Notes (Signed)
Spoke with cardiology, given verbal order to give 500 cc NS bolus and stop nitroglycerin completely. NP to come assess patient. Cath lab pads placed on patient, patient being monitored by zoll.

## 2016-02-22 NOTE — ED Notes (Signed)
Cardiology at bedside.

## 2016-02-22 NOTE — ED Notes (Signed)
Admitting MD at bedside.

## 2016-02-22 NOTE — Progress Notes (Signed)
Left groin sheath removed at 1830. Manual pressure held for 20 minutes. No active bleeding at this time. Site level 0. Site dressed per protocol with pressure dressing.   Myrtice Lauth, Liz Beach

## 2016-02-22 NOTE — ED Notes (Signed)
Spoke with MD Konrad Dolores, given verbal orders to make patient NPO at this time and repeat EKG. MD to contact cardiology at this time.

## 2016-02-23 ENCOUNTER — Encounter (HOSPITAL_COMMUNITY): Payer: Self-pay | Admitting: Interventional Cardiology

## 2016-02-23 ENCOUNTER — Inpatient Hospital Stay (HOSPITAL_COMMUNITY): Payer: Commercial Managed Care - HMO

## 2016-02-23 DIAGNOSIS — Z91199 Patient's noncompliance with other medical treatment and regimen due to unspecified reason: Secondary | ICD-10-CM

## 2016-02-23 DIAGNOSIS — Z89512 Acquired absence of left leg below knee: Secondary | ICD-10-CM

## 2016-02-23 DIAGNOSIS — E1151 Type 2 diabetes mellitus with diabetic peripheral angiopathy without gangrene: Secondary | ICD-10-CM

## 2016-02-23 DIAGNOSIS — R0789 Other chest pain: Secondary | ICD-10-CM

## 2016-02-23 DIAGNOSIS — Z89511 Acquired absence of right leg below knee: Secondary | ICD-10-CM

## 2016-02-23 DIAGNOSIS — R0602 Shortness of breath: Secondary | ICD-10-CM

## 2016-02-23 DIAGNOSIS — I255 Ischemic cardiomyopathy: Secondary | ICD-10-CM

## 2016-02-23 DIAGNOSIS — I10 Essential (primary) hypertension: Secondary | ICD-10-CM

## 2016-02-23 DIAGNOSIS — Z9119 Patient's noncompliance with other medical treatment and regimen: Secondary | ICD-10-CM

## 2016-02-23 LAB — ECHOCARDIOGRAM COMPLETE
CHL CUP MV DEC (S): 165
E decel time: 165 msec
E/e' ratio: 10.25
FS: 7 % — AB (ref 28–44)
HEIGHTINCHES: 69 in
IV/PV OW: 0.94
LA ID, A-P, ES: 43 mm
LA vol index: 33.8 mL/m2
LA vol: 80.2 mL
LADIAMINDEX: 1.81 cm/m2
LAVOLA4C: 82.5 mL
LDCA: 2.54 cm2
LEFT ATRIUM END SYS DIAM: 43 mm
LV E/e' medial: 10.25
LV TDI E'MEDIAL: 3.32
LVEEAVG: 10.25
LVELAT: 4.78 cm/s
LVOTD: 18 mm
MV pk A vel: 60.6 m/s
MV pk E vel: 49 m/s
PW: 12.1 mm — AB (ref 0.6–1.1)
TDI e' lateral: 4.78
WEIGHTICAEL: 4416.25 [oz_av]

## 2016-02-23 LAB — CBC
HCT: 47.9 % (ref 39.0–52.0)
Hemoglobin: 16.6 g/dL (ref 13.0–17.0)
MCH: 30 pg (ref 26.0–34.0)
MCHC: 34.7 g/dL (ref 30.0–36.0)
MCV: 86.6 fL (ref 78.0–100.0)
PLATELETS: 189 10*3/uL (ref 150–400)
RBC: 5.53 MIL/uL (ref 4.22–5.81)
RDW: 13.8 % (ref 11.5–15.5)
WBC: 11 10*3/uL — AB (ref 4.0–10.5)

## 2016-02-23 LAB — BASIC METABOLIC PANEL
Anion gap: 10 (ref 5–15)
BUN: 9 mg/dL (ref 6–20)
CALCIUM: 8.6 mg/dL — AB (ref 8.9–10.3)
CHLORIDE: 105 mmol/L (ref 101–111)
CO2: 22 mmol/L (ref 22–32)
CREATININE: 1.09 mg/dL (ref 0.61–1.24)
Glucose, Bld: 172 mg/dL — ABNORMAL HIGH (ref 65–99)
Potassium: 4 mmol/L (ref 3.5–5.1)
SODIUM: 137 mmol/L (ref 135–145)

## 2016-02-23 LAB — HEMOGLOBIN A1C
HEMOGLOBIN A1C: 10.6 % — AB (ref 4.8–5.6)
MEAN PLASMA GLUCOSE: 258 mg/dL

## 2016-02-23 LAB — GLUCOSE, CAPILLARY
Glucose-Capillary: 138 mg/dL — ABNORMAL HIGH (ref 65–99)
Glucose-Capillary: 200 mg/dL — ABNORMAL HIGH (ref 65–99)
Glucose-Capillary: 201 mg/dL — ABNORMAL HIGH (ref 65–99)
Glucose-Capillary: 237 mg/dL — ABNORMAL HIGH (ref 65–99)

## 2016-02-23 MED ORDER — INSULIN ASPART 100 UNIT/ML ~~LOC~~ SOLN
0.0000 [IU] | Freq: Three times a day (TID) | SUBCUTANEOUS | Status: DC
  Administered 2016-02-23: 3 [IU] via SUBCUTANEOUS
  Administered 2016-02-24 (×2): 2 [IU] via SUBCUTANEOUS
  Administered 2016-02-25: 3 [IU] via SUBCUTANEOUS

## 2016-02-23 MED ORDER — INSULIN ASPART 100 UNIT/ML ~~LOC~~ SOLN
0.0000 [IU] | Freq: Every day | SUBCUTANEOUS | Status: DC
  Administered 2016-02-23: 2 [IU] via SUBCUTANEOUS

## 2016-02-23 MED ORDER — PERFLUTREN LIPID MICROSPHERE
1.0000 mL | INTRAVENOUS | Status: DC | PRN
  Administered 2016-02-23: 2 mL via INTRAVENOUS
  Filled 2016-02-23: qty 10

## 2016-02-23 MED ORDER — POLYETHYLENE GLYCOL 3350 17 G PO PACK
17.0000 g | PACK | Freq: Every day | ORAL | Status: DC
  Administered 2016-02-24: 17 g via ORAL
  Filled 2016-02-23: qty 1

## 2016-02-23 NOTE — Progress Notes (Signed)
  Echocardiogram 2D Echocardiogram with Definity has been performed.  Cathie Beams 02/23/2016, 1:57 PM

## 2016-02-23 NOTE — Progress Notes (Signed)
[id number] MI education began with pt. He had eyes closed for some of the ed but he could answer questions when asked re ed. Discussed MI restrictions, purpose of brilinta, risk factors, diabetic diet. Pt has A1C of 10.6 but he does not watch diet much. I gave diabetic diet and discussed carb counting and healthy food choices.  Will follow up tomorrow for more ed and to ambulate if ok with cardiology. Pt needs to see case manager for assistance with meds and for brilinta card. Luetta Nutting RN BSN 02/23/2016 12:06 PM

## 2016-02-23 NOTE — Progress Notes (Signed)
BP 97/47; HR 95.  Metoprolol 50mg  PO was not given, per Dr. Darnelle Catalan.

## 2016-02-23 NOTE — Progress Notes (Addendum)
     SUBJECTIVE: No chest pain or dyspnea.   Tele: sinus  BP 116/76   Pulse 90   Temp 98.6 F (37 C) (Oral)   Resp (!) 32   Ht 5\' 9"  (1.753 m)   Wt 276 lb 0.3 oz (125.2 kg)   SpO2 97%   BMI 40.76 kg/m   Intake/Output Summary (Last 24 hours) at 02/23/16 3428 Last data filed at 02/23/16 0700  Gross per 24 hour  Intake           611.08 ml  Output             1075 ml  Net          -463.92 ml    PHYSICAL EXAM General: Well developed, well nourished, in no acute distress. Alert and oriented x 3.  Psych:  Good affect, responds appropriately Neck: No JVD. No masses noted.  Lungs: Clear bilaterally with no wheezes or rhonci noted.  Heart: RRR with no murmurs noted. Abdomen: Bowel sounds are present. Soft, non-tender.  Extremities: Bilateral BKA  LABS: Basic Metabolic Panel:  Recent Labs  76/81/15 0403 02/23/16 0547  NA 133* 137  K 3.9 4.0  CL 102 105  CO2 23 22  GLUCOSE 314* 172*  BUN 9 9  CREATININE 1.02 1.09  CALCIUM 8.7* 8.6*   CBC:  Recent Labs  02/22/16 0403 02/23/16 0547  WBC 6.8 11.0*  NEUTROABS 3.5  --   HGB 16.2 16.6  HCT 46.4 47.9  MCV 85.5 86.6  PLT 201 189   Cardiac Enzymes:  Recent Labs  02/22/16 0843 02/22/16 1224 02/22/16 1828  TROPONINI 3.62* 21.36* 56.86*   Current Meds: . aspirin  81 mg Oral Daily  . ezetimibe  10 mg Oral Daily  . Influenza vac split quadrivalent PF  0.5 mL Intramuscular Tomorrow-1000  . insulin aspart  0-15 Units Subcutaneous TID WC  . insulin aspart  0-5 Units Subcutaneous QHS  . insulin glargine  50 Units Subcutaneous BID  . lisinopril  5 mg Oral Daily  . metoprolol succinate  50 mg Oral BID AC  . rosuvastatin  20 mg Oral QHS  . sodium chloride flush  3 mL Intravenous Q12H  . sodium chloride flush  3 mL Intravenous Q12H  . sodium chloride flush  3 mL Intravenous Q12H  . ticagrelor  90 mg Oral BID     ASSESSMENT AND PLAN:  1. CAD/NSTEMI: Pt with known history of CAD s/p CABG. Admitted with chest  pain/NSTEMI. Cardiac cath per Dr. Eldridge Dace with occluded LIMA graft to LAD. The native LAD was also occluded at the ostium. The native LAD was opened with balloon angioplasty. Patent vein graft to several OM branches. The native RCA is occluded and the vein graft to the RCA is occluded (known from last cath in 2011).  Plans for medical management of CAD at this point. No stent was placed yesterday. Will continue ASA, Brilinta, statin, beta blocker and Ace-inh.   2. DM: Per primary team.   3. Ischemic cardiomyopathy: LVEF was 25-30% in February of this year. Will repeat echo now given acute occlusion of the anterior wall circulation. Continue coreg and lisinopril. He will need close follow up with Dr. Antoine Poche after discharge.   4. Atrial fib, paroxysmal: He is in sinus. He has not taken Xarelto for one month. Would restart Xarelto tomorrow if cath site stable in left groin.   Verne Carrow  11/17/20179:28 AM

## 2016-02-23 NOTE — Care Management (Addendum)
CM submitted benefit check for both Eliquis and Brilinta as alternate to Xarelto per request  ELIQUIS  2.5 MG   BID  30/60 TAB      COVER- YES   CO-PAY- $ 47.00   TIER- 3 DRUG   PRIOR APPROVAL- NO   ELIQUIS  5 MG BID 30/60 TAB  COVER- YES  * SAME AS ABOVE    2.BRILINTA 90 MG BID 30/30TAB   COVER- YES   CO-PAY- $ 47.00   TIER- 3 DRUG   PRIOR APPROVAL- NO   PH[facility]ACY: [facility]  MAIL-ORDER FOR 90 DAY SUPPLY $ 131.00  Attending please write CM consult once NOAC is determined

## 2016-02-23 NOTE — Progress Notes (Signed)
Patient awake and coughing.  Denies chest pain but reports nausea.  Zofran 4mg  IV given per PRN standing Cardiology orders.  Will continue to assess.  VSS

## 2016-02-23 NOTE — Progress Notes (Addendum)
Pt transferred from [facility] without Nitro gtts going; however, nitro gtts still exists in orders.  Have paged cardiology with request to D/C order if they no longer want pt to have.  Waiting for response.  Nitro orders have been discontinued, per cardiology.

## 2016-02-23 NOTE — Progress Notes (Signed)
Transferred to 3E24 via bed. Portable monitor on. No changes.

## 2016-02-23 NOTE — Progress Notes (Signed)
Progress Note    Leroy [name]  WJX:[id number] DOB: 03-22-1946  DOA: 02/22/2016 PCP: [name], MD    Brief Narrative:   Chief complaint: Follow-up chest pain  Leroy [name] is an 70 y.o. male with a PMH of CKD, atrial flutter previously anticoagulated with Xarelto but patient was not taking due to cost, DM, bilateral BKA, CVA, ischemic cardiomyopathy last EF was 25-30 % in Feb. 2017, CAD s/p CABG in 2001 in [place], last cath was 2011 had patent grafts to LAD and circumflex, graft occluded to RCA, who was admitted 02/22/16 for evaluation of chest pain. His initial troponin in the ED was 0.10, subsequent was 3.62. His EKG showed NSR with LBBB, with T wave inversion, similar to prior EKGs.  Assessment/Plan:   Principal Problem:   NSTEMI (non-ST elevated myocardial infarction) (HCC) in a patient with known CAD s/p CABG in 2000/Chest pain Placed on a heparin and NTG drip on admission. Underwent cardiac cath 02/22/16. 100% stenosis of LAD noted, s/p angioplasty. The 1st & 2nd OM branches as well as the RCA also were 100% stenosed, But patent graft to several OM branches. Continue ASA, heparin drip, NTG drip and Brilinta. Continue statin, beta blocker and ACE inhibitor. Medical management recommended per cardiology.  Active Problems:   Noncompliance Case manager to assist with medications.    Hyperlipidemia Continue Zetia and Crestor.    ATRIAL FIBRILLATION, PAROXYSMAL Has been maintaining sinus rhythm with a left bundle branch block on telemetry. Cardiology recommends resuming Xarelto 02/24/16 if Site stable in left groin.    H/O CVA Continue risk factor modification. Continue aspirin.    S/P BKA (below knee amputation) bilateral (HCC)  Ambulates w/ prosthesis.     Chronic systolic heart failure (HCC)/Ischemic cardiomyopathy EF was 25-30% in February of this year. Repeat echocardiogram done 02/23/16. Results pending. Continue metoprolol and lisinopril. Appears to be  compensated.    Diabetes mellitus type 2 with peripheral artery disease (HCC) Metformin on hold.  Currently being managed with insulin sensitive SSI and 50 units of Lantus daily.CBGs 188-328. Change SSI to moderate scale.    Essential hypertension Continue Lisinopril and metoprolol.   Family Communication/Anticipated D/C date and plan/Code Status   DVT prophylaxis: Currently on Brilinta. Code Status: Full Code.  Family Communication: No family at the bedside. Disposition Plan: Home when cleared by cardiology.   Medical Consultants:    Cardiology   Procedures:    Left heart catheterization 02/22/16  2-D echo 02/23/16  Anti-Infectives:    None  Subjective:   The patient reports that He has had some mild shortness of breath. Review of symptoms is positive for constipation and negative for chest pain, nausea, vomiting. His bowels have not moved in 2-3 days.  Objective:    Vitals:   02/23/16 0400 02/23/16 0500 02/23/16 0600 02/23/16 0700  BP: (!) 121/92 (!) 122/93 (!) 102/48 116/76  Pulse: 98 90  90  Resp: (!) 29 (!) 28 20 (!) 32  Temp: 99 F (37.2 C)     TempSrc: Oral     SpO2: 99% 100% 98% 97%  Weight:      Height:        Intake/Output Summary (Last 24 hours) at 02/23/16 0720 Last data filed at 02/23/16 0700  Gross per 24 hour  Intake           611.08 ml  Output             1575 ml  Net          -  963.92 ml   Filed Weights   02/22/16 1712  Weight: 125.2 kg (276 lb 0.3 oz)    Exam: General exam: Appears calm and comfortable.  Respiratory system:Decreased breath sounds. Respiratory effort normal. Cardiovascular system: S1 & S2 heard, RRR. No JVD,  rubs, gallops or clicks. No murmurs. Gastrointestinal system: Abdomen is nondistended, soft and nontender. No organomegaly or masses felt. Normal bowel sounds heard. Central nervous system: Alert and oriented. No focal neurological deficits. Extremities: Bilateral amputee. Skin: No rashes, lesions or  ulcers. Psychiatry: Judgement and insight appear impaired. Mood & affect flat.   Data Reviewed:   I have personally reviewed following labs and imaging studies:  Labs: Basic Metabolic Panel:  Recent Labs Lab 02/22/16 0403 02/23/16 0547  NA 133* 137  K 3.9 4.0  CL 102 105  CO2 23 22  GLUCOSE 314* 172*  BUN 9 9  CREATININE 1.02 1.09  CALCIUM 8.7* 8.6*   GFR Estimated Creatinine Clearance: 83.7 mL/min (by C-G formula based on SCr of 1.09 mg/dL). Liver Function Tests:  Recent Labs Lab 02/22/16 0403  AST 29  ALT 26  ALKPHOS 107  BILITOT 0.6  PROT 7.0  ALBUMIN 3.5   Coagulation profile  Recent Labs Lab 02/22/16 0403  INR 0.97    CBC:  Recent Labs Lab 02/22/16 0403 02/23/16 0547  WBC 6.8 11.0*  NEUTROABS 3.5  --   HGB 16.2 16.6  HCT 46.4 47.9  MCV 85.5 86.6  PLT 201 189   Cardiac Enzymes:  Recent Labs Lab 02/22/16 0403 02/22/16 0843 02/22/16 1224 02/22/16 1828  TROPONINI 0.10* 3.62* 21.36* 56.86*   CBG:  Recent Labs Lab 02/22/16 1252 02/22/16 1728 02/22/16 2153  GLUCAP 328* 223* 188*   Hgb A1c:  Recent Labs  02/22/16 0843  HGBA1C 10.6*    Microbiology Recent Results (from the past 240 hour(s))  MRSA PCR Screening     Status: None   Collection Time: 02/22/16  5:35 PM  Result Value Ref Range Status   MRSA by PCR NEGATIVE NEGATIVE Final    Comment:        The GeneXpert MRSA Assay (FDA approved for NASAL specimens only), is one component of a comprehensive MRSA colonization surveillance program. It is not intended to diagnose MRSA infection nor to guide or monitor treatment for MRSA infections.     Radiology: Dg Chest Port 1 View  Result Date: 02/22/2016 CLINICAL DATA:  70 y/o  M; shortness of breath. EXAM: PORTABLE CHEST 1 VIEW COMPARISON:  02/22/2016 chest radiograph FINDINGS: The heart size and mediastinal contours are within normal limits and stable. Both lungs are clear. The visualized skeletal structures are  unremarkable. IMPRESSION: No active disease. Electronically Signed   By: Mitzi Hansen M.D.   On: 02/22/2016 20:32   Dg Chest Portable 1 View  Result Date: 02/22/2016 CLINICAL DATA:  Chest pain tonight EXAM: PORTABLE CHEST 1 VIEW COMPARISON:  11/13/2015 FINDINGS: A single AP portable view of the chest demonstrates no focal airspace consolidation or alveolar edema. The lungs are grossly clear. There is no large effusion or pneumothorax. Cardiac and mediastinal contours appear unremarkable. IMPRESSION: No active disease. Electronically Signed   By: Ellery Plunk M.D.   On: 02/22/2016 04:51    Medications:   . aspirin  81 mg Oral Daily  . ezetimibe  10 mg Oral Daily  . Influenza vac split quadrivalent PF  0.5 mL Intramuscular Tomorrow-1000  . insulin aspart  0-5 Units Subcutaneous QHS  . insulin aspart  0-9  Units Subcutaneous TID WC  . insulin glargine  50 Units Subcutaneous BID  . lisinopril  5 mg Oral Daily  . metoprolol succinate  50 mg Oral BID AC  . rosuvastatin  20 mg Oral QHS  . sodium chloride flush  3 mL Intravenous Q12H  . sodium chloride flush  3 mL Intravenous Q12H  . sodium chloride flush  3 mL Intravenous Q12H  . ticagrelor  90 mg Oral BID   Continuous Infusions: . heparin Stopped (02/22/16 1518)  . nitroGLYCERIN Stopped (02/22/16 1404)    Medical decision making is of high complexity and this patient is at high risk of deterioration, therefore this is a level 3 visit.     LOS: 1 day   Jayvien Rowlette  [facility] Pager [phone]. If unable to reach me by pager, please call my cell phone at [phone].  *Please refer to [email or web address], password [email or web address] to get updated schedule on who will round on this patient, as hospitalists switch teams weekly. If 7PM-7AM, please contact night-coverage at [email or web address], password [email or web address] for any overnight needs.  02/23/2016, 7:20 AM

## 2016-02-23 NOTE — Progress Notes (Signed)
pt states statins give him cramps at times. Currently denies myalgia and cramps at this time.

## 2016-02-23 NOTE — Care Management Note (Addendum)
Case Management Note  Patient Details  Name: Leroy [name] MRN: [id number] Date of Birth: 03/26/1946  Subjective/Objective:    Pt admitted with CP                  Action/Plan:   PTA from home alone - per pt he is independent with 2 prostetic legs, pt states he has been a double amputee for approximately 3 years.  Pt states he still drives and denied needed HH nor DME.  CM submitted benefit check for Eliquis and Brilinita as pt was unable to afford Xarelto.  Pt is now on Brilinta and therefore CM provided free 30 day card and informed of copay $47 per month.  Pt states he uses [facility] at [facility] for Pharmacy - CM contacted pharmacy and was informed that pharmacy cant  fill med.  CM contacted [facility] on [address] and was informed that they can fill the med - Pt is in agreement to get [facility] filled at [facility] [address] - long term friend will pick pt up from hosptial   Expected Discharge Date:                  Expected Discharge Plan:  Home/Self Care  In-House Referral:     Discharge planning Services  CM Consult  Post Acute Care Choice:    Choice offered to:     DME Arranged:    DME Agency:     HH Arranged:    HH Agency:     Status of Service:  In process, will continue to follow  If discussed at Long Length of Stay Meetings, dates discussed:    Additional Comments:  Cherylann Parr, RN 02/23/2016, 3:53 PM

## 2016-02-24 LAB — CBC
HCT: 43.1 % (ref 39.0–52.0)
Hemoglobin: 14.8 g/dL (ref 13.0–17.0)
MCH: 29.7 pg (ref 26.0–34.0)
MCHC: 34.3 g/dL (ref 30.0–36.0)
MCV: 86.4 fL (ref 78.0–100.0)
PLATELETS: 189 10*3/uL (ref 150–400)
RBC: 4.99 MIL/uL (ref 4.22–5.81)
RDW: 13.8 % (ref 11.5–15.5)
WBC: 16.2 10*3/uL — AB (ref 4.0–10.5)

## 2016-02-24 LAB — BASIC METABOLIC PANEL
ANION GAP: 10 (ref 5–15)
BUN: 17 mg/dL (ref 6–20)
CALCIUM: 8.7 mg/dL — AB (ref 8.9–10.3)
CO2: 22 mmol/L (ref 22–32)
CREATININE: 1.57 mg/dL — AB (ref 0.61–1.24)
Chloride: 105 mmol/L (ref 101–111)
GFR, EST AFRICAN AMERICAN: 50 mL/min — AB (ref >60)
GFR, EST NON AFRICAN AMERICAN: 43 mL/min — AB (ref >60)
Glucose, Bld: 97 mg/dL (ref 65–99)
Potassium: 3.3 mmol/L — ABNORMAL LOW (ref 3.5–5.1)
SODIUM: 137 mmol/L (ref 135–145)

## 2016-02-24 LAB — GLUCOSE, CAPILLARY
GLUCOSE-CAPILLARY: 77 mg/dL (ref 65–99)
GLUCOSE-CAPILLARY: 148 mg/dL — AB (ref 65–99)
Glucose-Capillary: 121 mg/dL — ABNORMAL HIGH (ref 65–99)
Glucose-Capillary: 131 mg/dL — ABNORMAL HIGH (ref 65–99)

## 2016-02-24 MED ORDER — RIVAROXABAN 20 MG PO TABS
20.0000 mg | ORAL_TABLET | Freq: Every day | ORAL | Status: DC
  Administered 2016-02-24: 20 mg via ORAL
  Filled 2016-02-24: qty 1

## 2016-02-24 MED ORDER — POTASSIUM CHLORIDE CRYS ER 20 MEQ PO TBCR
60.0000 meq | EXTENDED_RELEASE_TABLET | Freq: Once | ORAL | Status: AC
  Administered 2016-02-24: 60 meq via ORAL
  Filled 2016-02-24: qty 3

## 2016-02-24 NOTE — Progress Notes (Signed)
     SUBJECTIVE: No chest pain; mild dyspnea  Tele: sinus with PVCs  BP (!) 95/55 (BP Location: Right Arm)   Pulse 94   Temp 97.7 F (36.5 C) (Oral)   Resp 18   Ht 5\' 9"  (1.753 m)   Wt 277 lb 6.4 oz (125.8 kg)   SpO2 95%   BMI 40.96 kg/m   Intake/Output Summary (Last 24 hours) at 02/24/16 1208 Last data filed at 02/24/16 1594  Gross per 24 hour  Intake              775 ml  Output              450 ml  Net              325 ml    PHYSICAL EXAM General: Well developed, well nourished, in no acute distress. Alert and oriented x 3.  Psych:  Good affect, responds appropriately Neck: Supple Lungs: CTA Heart: RRR  Abdomen: Bowel sounds are present. Soft, non-tender.  Extremities: Bilateral BKA  LABS: Basic Metabolic Panel:  Recent Labs  58/59/29 0547 02/24/16 0536  NA 137 137  K 4.0 3.3*  CL 105 105  CO2 22 22  GLUCOSE 172* 97  BUN 9 17  CREATININE 1.09 1.57*  CALCIUM 8.6* 8.7*   CBC:  Recent Labs  02/22/16 0403 02/23/16 0547 02/24/16 0536  WBC 6.8 11.0* 16.2*  NEUTROABS 3.5  --   --   HGB 16.2 16.6 14.8  HCT 46.4 47.9 43.1  MCV 85.5 86.6 86.4  PLT 201 189 189   Cardiac Enzymes:  Recent Labs  02/22/16 0843 02/22/16 1224 02/22/16 1828  TROPONINI 3.62* 21.36* 56.86*   Current Meds: . aspirin  81 mg Oral Daily  . ezetimibe  10 mg Oral Daily  . insulin aspart  0-15 Units Subcutaneous TID WC  . insulin aspart  0-5 Units Subcutaneous QHS  . insulin glargine  50 Units Subcutaneous BID  . lisinopril  5 mg Oral Daily  . metoprolol succinate  50 mg Oral BID AC  . polyethylene glycol  17 g Oral Daily  . rosuvastatin  20 mg Oral QHS  . sodium chloride flush  3 mL Intravenous Q12H  . ticagrelor  90 mg Oral BID     ASSESSMENT AND PLAN:  1. CAD/NSTEMI: Pt with known history of CAD s/p CABG. Admitted with chest pain/NSTEMI. Cardiac cath per Dr. Eldridge Dace with occluded LIMA graft to LAD. The native LAD was also occluded at the ostium. The native LAD was  opened with balloon angioplasty. Patent vein graft to several OM branches. The native RCA is occluded and the vein graft to the RCA is occluded (known from last cath in 2011).  Continue medical therapy. No stent was placed. Will continue ASA, Brilinta, statin, beta blocker and Ace-inh.   2. DM: Per primary team.   3. Ischemic cardiomyopathy: Continue coreg and lisinopril. EF severely reduced. He will need follow-up echocardiogram in approximately 3 months. May require ICD if LV function does not improve. He will need close follow up with Dr. Antoine Poche after discharge.   4. Atrial fib, paroxysmal: He is in sinus. Restart xarelto  5. Acute kidney disease-creatinine is mildly increased today. Possibly related to contrast. Will continue ACE inhibitor for now. Recheck renal function tomorrow morning. If creatinine worse we'll need to hold ACE inhibitor.  Leroy [name]  11/18/201712:08 PM

## 2016-02-24 NOTE — Progress Notes (Signed)
Patient with no c/o shortness of breath or pain during 7p-7a shift.  Patient did get up with prosthesis and walk to the bathroom, appeared to be short of breath with exertion, O2 sats maintained in mid to high 90's without oxygen.

## 2016-02-24 NOTE — Progress Notes (Signed)
Patient refused ambulation, states I just took my legs off.  Education completed re: home exercise instructions, referred to phase II cardiac rehab in [place].  Falling asleep while education was done, says he has no further questions.  Instructed to walk with nursing staff later this evening when he puts prosthetics back on.   [phone]

## 2016-02-24 NOTE — Progress Notes (Signed)
ANTICOAGULATION CONSULT NOTE  Pharmacy Consult for Xarelto Indication: atrial fibrillation  Allergies  Allergen Reactions  . Statins Other (See Comments)    Reaction:  Muscle pain   . Amlodipine Besylate Other (See Comments)    Reaction:  Muscle pain   . Cephalexin Hives    Patient Measurements: Height: 5\' 9"  (175.3 cm) Weight: 277 lb 6.4 oz (125.8 kg) IBW/kg (Calculated) : 70.7  Vital Signs: Temp: 97.7 F (36.5 C) (11/18 0506) Temp Source: Oral (11/18 0506) BP: 95/55 (11/18 1047) Pulse Rate: 94 (11/18 1047)  Labs:  Recent Labs  02/22/16 0403 02/22/16 0843 02/22/16 1224 02/22/16 1828 02/23/16 0547 02/24/16 0536  HGB 16.2  --   --   --  16.6 14.8  HCT 46.4  --   --   --  47.9 43.1  PLT 201  --   --   --  189 189  LABPROT 12.9  --   --   --   --   --   INR 0.97  --   --   --   --   --   CREATININE 1.02  --   --   --  1.09 1.57*  TROPONINI 0.10* 3.62* 21.36* 56.86*  --   --     Estimated Creatinine Clearance: 58.2 mL/min (by C-G formula based on SCr of 1.57 mg/dL (H)).   Medical History: Past Medical History:  Diagnosis Date  . Atrial flutter (HCC)   . Cataract   . Chronic kidney disease    on Lisinopril to protect kidneys d/t being on Metformin per pt  . Chronic systolic CHF (congestive heart failure) (HCC)   . Coronary artery disease    a. s/p remote CABG 2001 at [facility] b. Cath 03/2010: for med rx.;  c.  [name] myoview (1/14):  Large Inferior apical MI, very small area of peri-infarct ischemia toward the inferior apical segment. EF 19%.  Med Rx was continued.    . Diabetes mellitus 1979   takes Metformni daily  and Lantus 50units bid  . DVT (deep venous thrombosis) (HCC) 2011   legs  . H/O hiatal hernia   . H/O medication noncompliance    Due to insurance issues  . History of blood clots    in legs--this was in 2011--has been off of Coumadin 18months;takes Xarelto daily  . Hyperlipidemia   . Hypotension   . Ischemic cardiomyopathy    a. EF 40%  2010. b. Echo (2/14):  mild LVH, EF 30-35%, diff HK, Gr 1 DD, MAC, mild MR, mod LAE, PASP 39  . Joint pain   . Myocardial infarction    total of 8 heart attacks;last one in 2011  . Paroxysmal atrial fibrillation (HCC)   . Peripheral neuropathy (HCC)   . Peripheral vascular disease (HCC)    a. s/p L BKA 2014.  [name] Shortness of breath dyspnea   . Stroke [facility])      Assessment: 70 yo male with history of CAD, s/p prior CABG and AFib. Pt was on Xarelto prior to admission, this has been on hold while admitted, and was placed on a heparin gtt. Heparin gtt was dc'd following cath. He did not receive have a PCI this time. Planning to continue medical management. Pt will be at higher risk of bleeding with triple therapy, but was stable on this PTA.   Goal of Therapy:  Monitor platelets by anticoagulation protocol: Yes   Plan:  -Xarelto 20 mg po daily -Monitor s/sx bleeding  Banner Huckaba,  Darl HouseholderAlison M 02/24/2016,12:38 PM

## 2016-02-24 NOTE — Progress Notes (Signed)
Progress Note    Leroy GriffithsWilliam J [name]  AOZ:[id number] DOB: 02/17/1946  DOA: 02/22/2016 PCP: [name], MD    Brief Narrative:   Chief complaint: Follow-up chest pain  Leroy GriffithsWilliam J [name] is an 70 y.o. male with a PMH of CKD, atrial flutter previously anticoagulated with Xarelto but patient was not taking due to cost, DM, bilateral BKA, CVA, ischemic cardiomyopathy last EF was 25-30 % in Feb. 2017, CAD s/p CABG in 2001 in [place], last cath was 2011 had patent grafts to LAD and circumflex, graft occluded to RCA, who was admitted 02/22/16 for evaluation of chest pain. His initial troponin in the ED was 0.10, subsequent was 3.62. His EKG showed NSR with LBBB, with T wave inversion, similar to prior EKGs.  Assessment/Plan:   NSTEMI (non-ST elevated myocardial infarction) (HCC) in a patient with known CAD s/p CABG in 2000/Chest pain Placed on a heparin and NTG drip on admission. Underwent cardiac cath 02/22/16. 100% stenosis of LAD noted, s/p angioplasty. The 1st & 2nd OM branches as well as the RCA also were 100% stenosed, But patent graft to several OM branches. Continue ASA, heparin drip, NTG drip and Brilinta. Continue statin, beta blocker and ACE inhibitor. Medical management recommended per cardiology.    Noncompliance Case manager to assist with medications.    Hyperlipidemia Continue Zetia and Crestor.    ATRIAL FIBRILLATION, PAROXYSMAL Has been maintaining sinus rhythm with a left bundle branch block on telemetry. Cardiology recommends resuming Xarelto 02/24/16 if Site stable in left groin.  Asked Care manager to assist patient with medication as he says he was having trouble affording the medication.     H/O CVA Continue risk factor modification. Continue aspirin.    S/P BKA (below knee amputation) bilateral (HCC)  Ambulates w/ prosthesis.     Chronic systolic heart failure (HCC)/Ischemic cardiomyopathy EF was 25-30% in February of this year. Repeat echocardiogram done  02/23/16. Results pending. Continue metoprolol and lisinopril. Appears to be compensated.    Diabetes mellitus type 2 with peripheral artery disease (HCC) Metformin on hold.  Currently being managed with insulin sensitive SSI and 50 units of Lantus daily.CBGs 188-328. Change SSI to moderate scale. CBG (last 3)   Recent Labs  02/23/16 1731 02/23/16 2122 02/24/16 0644  GLUCAP 201* 237* 77     Essential hypertension Continue Lisinopril and metoprolol.   Family Communication/Anticipated D/C date and plan/Code Status   DVT prophylaxis: Currently on Brilinta. Code Status: Full Code.  Family Communication: No family at the bedside. Disposition Plan: Home when cleared by cardiology.   Medical Consultants:    Cardiology   Procedures:    Left heart catheterization 02/22/16  2-D echo 02/23/16  Anti-Infectives:    None  Subjective:   The patient says that he was having some trouble affording the xarelto.  No bleeding from cath site.   Objective:    Vitals:   02/23/16 2145 02/24/16 0506 02/24/16 0520 02/24/16 0523  BP: 99/65  (!) 120/95 118/67  Pulse: (!) 101 (!) 103 100 99  Resp: 18 18    Temp: 99.3 F (37.4 C) 97.7 F (36.5 C)    TempSrc: Oral Oral    SpO2: 100% 95%    Weight:  125.8 kg (277 lb 6.4 oz)    Height:        Intake/Output Summary (Last 24 hours) at 02/24/16 0856 Last data filed at 02/24/16 0842  Gross per 24 hour  Intake  775 ml  Output              450 ml  Net              325 ml   Filed Weights   02/22/16 1712 02/23/16 1702 02/24/16 0506  Weight: 125.2 kg (276 lb 0.3 oz) 126.2 kg (278 lb 4.8 oz) 125.8 kg (277 lb 6.4 oz)    Exam: General exam: Appears calm and comfortable.  Respiratory system:Decreased breath sounds. Respiratory effort normal. Cardiovascular system: S1 & S2 heard, RRR. No JVD,  rubs, gallops or clicks. No murmurs. Gastrointestinal system: Abdomen is nondistended, soft and nontender. No organomegaly or  masses felt. Normal bowel sounds heard. Central nervous system: Alert and oriented. No focal neurological deficits. Extremities: Bilateral amputee. Skin: No rashes, lesions or ulcers. Psychiatry: Judgement and insight appear impaired. Mood & affect flat.   Data Reviewed:   I have personally reviewed following labs and imaging studies:  Labs: Basic Metabolic Panel:  Recent Labs Lab 02/22/16 0403 02/23/16 0547 02/24/16 0536  NA 133* 137 137  K 3.9 4.0 3.3*  CL 102 105 105  CO2 23 22 22   GLUCOSE 314* 172* 97  BUN 9 9 17   CREATININE 1.02 1.09 1.57*  CALCIUM 8.7* 8.6* 8.7*   GFR Estimated Creatinine Clearance: 58.2 mL/min (by C-G formula based on SCr of 1.57 mg/dL (H)). Liver Function Tests:  Recent Labs Lab 02/22/16 0403  AST 29  ALT 26  ALKPHOS 107  BILITOT 0.6  PROT 7.0  ALBUMIN 3.5   Coagulation profile  Recent Labs Lab 02/22/16 0403  INR 0.97    CBC:  Recent Labs Lab 02/22/16 0403 02/23/16 0547 02/24/16 0536  WBC 6.8 11.0* 16.2*  NEUTROABS 3.5  --   --   HGB 16.2 16.6 14.8  HCT 46.4 47.9 43.1  MCV 85.5 86.6 86.4  PLT 201 189 189   Cardiac Enzymes:  Recent Labs Lab 02/22/16 0403 02/22/16 0843 02/22/16 1224 02/22/16 1828  TROPONINI 0.10* 3.62* 21.36* 56.86*   CBG:  Recent Labs Lab 02/23/16 0915 02/23/16 1248 02/23/16 1731 02/23/16 2122 02/24/16 0644  GLUCAP 138* 200* 201* 237* 77   Hgb A1c:  Recent Labs  02/22/16 0843  HGBA1C 10.6*    Microbiology Recent Results (from the past 240 hour(s))  MRSA PCR Screening     Status: None   Collection Time: 02/22/16  5:35 PM  Result Value Ref Range Status   MRSA by PCR NEGATIVE NEGATIVE Final    Comment:        The GeneXpert MRSA Assay (FDA approved for NASAL specimens only), is one component of a comprehensive MRSA colonization surveillance program. It is not intended to diagnose MRSA infection nor to guide or monitor treatment for MRSA infections.     Radiology: Dg  Chest Port 1 View  Result Date: 02/22/2016 CLINICAL DATA:  70 y/o  M; shortness of breath. EXAM: PORTABLE CHEST 1 VIEW COMPARISON:  02/22/2016 chest radiograph FINDINGS: The heart size and mediastinal contours are within normal limits and stable. Both lungs are clear. The visualized skeletal structures are unremarkable. IMPRESSION: No active disease. Electronically Signed   By: Mitzi Hansen M.D.   On: 02/22/2016 20:32    Medications:   . aspirin  81 mg Oral Daily  . ezetimibe  10 mg Oral Daily  . insulin aspart  0-15 Units Subcutaneous TID WC  . insulin aspart  0-5 Units Subcutaneous QHS  . insulin glargine  50 Units Subcutaneous BID  .  lisinopril  5 mg Oral Daily  . metoprolol succinate  50 mg Oral BID AC  . polyethylene glycol  17 g Oral Daily  . rosuvastatin  20 mg Oral QHS  . sodium chloride flush  3 mL Intravenous Q12H  . ticagrelor  90 mg Oral BID   Continuous Infusions:   Medical decision making is of high complexity and this patient is at high risk of deterioration, therefore this is a level 3 visit.     LOS: 2 days   Leroy [name] [phone].   *Please refer to [email or web address], password [email or web address] to get updated schedule on who will round on this patient, as hospitalists switch teams weekly. If 7PM-7AM, please contact night-coverage at [email or web address], password [email or web address] for any overnight needs.  02/24/2016, 8:56 AM

## 2016-02-25 ENCOUNTER — Other Ambulatory Visit: Payer: Self-pay | Admitting: Physician Assistant

## 2016-02-25 DIAGNOSIS — N179 Acute kidney failure, unspecified: Secondary | ICD-10-CM

## 2016-02-25 DIAGNOSIS — Z23 Encounter for immunization: Secondary | ICD-10-CM | POA: Diagnosis not present

## 2016-02-25 LAB — GLUCOSE, CAPILLARY
Glucose-Capillary: 72 mg/dL (ref 65–99)
Glucose-Capillary: 175 mg/dL — ABNORMAL HIGH (ref 65–99)

## 2016-02-25 LAB — BASIC METABOLIC PANEL
Anion gap: 10 (ref 5–15)
BUN: 20 mg/dL (ref 6–20)
CALCIUM: 8.7 mg/dL — AB (ref 8.9–10.3)
CO2: 19 mmol/L — AB (ref 22–32)
CREATININE: 1.61 mg/dL — AB (ref 0.61–1.24)
Chloride: 105 mmol/L (ref 101–111)
GFR calc Af Amer: 49 mL/min — ABNORMAL LOW (ref >60)
GFR calc non Af Amer: 42 mL/min — ABNORMAL LOW (ref >60)
GLUCOSE: 115 mg/dL — AB (ref 65–99)
Potassium: 4.9 mmol/L (ref 3.5–5.1)
Sodium: 134 mmol/L — ABNORMAL LOW (ref 135–145)

## 2016-02-25 LAB — CBC
HEMATOCRIT: 41.4 % (ref 39.0–52.0)
Hemoglobin: 14.6 g/dL (ref 13.0–17.0)
MCH: 30.3 pg (ref 26.0–34.0)
MCHC: 35.3 g/dL (ref 30.0–36.0)
MCV: 85.9 fL (ref 78.0–100.0)
Platelets: 220 10*3/uL (ref 150–400)
RBC: 4.82 MIL/uL (ref 4.22–5.81)
RDW: 14 % (ref 11.5–15.5)
WBC: 16.5 10*3/uL — ABNORMAL HIGH (ref 4.0–10.5)

## 2016-02-25 MED ORDER — CLOPIDOGREL BISULFATE 75 MG PO TABS: 75.0000 mg | ORAL_TABLET | Freq: Every day | ORAL | 0 refills | Status: DC

## 2016-02-25 MED ORDER — LOSARTAN POTASSIUM 25 MG PO TABS: 25.0000 mg | ORAL_TABLET | Freq: Every day | ORAL | 0 refills | Status: DC

## 2016-02-25 MED ORDER — LOSARTAN POTASSIUM 25 MG PO TABS: 25.0000 mg | ORAL_TABLET | Freq: Every day | ORAL | Status: DC

## 2016-02-25 MED ORDER — RIVAROXABAN 20 MG PO TABS: 20.0000 mg | ORAL_TABLET | Freq: Every day | ORAL | 0 refills | Status: DC

## 2016-02-25 MED ORDER — CLOPIDOGREL BISULFATE 75 MG PO TABS: 75.0000 mg | ORAL_TABLET | Freq: Every day | ORAL | Status: DC

## 2016-02-25 NOTE — Discharge Instructions (Signed)
Heart Disease Prevention Heart disease is a leading cause of death. There are many things you can do to help prevent heart disease. Be physically active Physical activity is good for your heart. It helps control your blood pressure, cholesterol levels, and weight. Try to be physically active every day. Ask your health care provider what activities are best for you. Be a healthy weight Extra weight can strain your heart and affect your blood pressure and cholesterol levels. Lose weight with diet and exercise if recommended by your health care provider. Eat heart-healthy foods Follow a healthy eating plan as recommended by your health care provider or dietitian. Heart-healthy foods include:  High-fiber foods. These include oat bran, oatmeal, and whole-grain breads and cereals.  Fruits and vegetables. Avoid:  Alcohol.  Fried foods.  Foods high in saturated fat. These include meats, butter, whole dairy products, shortening, and coconut or palm oil.  Salty foods. These include canned food, luncheon meat, salty snacks, and fast food. Keep your cholesterol levels under control Cholesterol is a substance that is used for many important functions. When your cholesterol levels are high, cholesterol can stick to the insides of your blood vessels, making them narrow or clog. This can lead to chest pain (angina) and a heart attack. Keep your cholesterol levels under control as recommended by your health care provider. Have your cholesterol checked at least once a year. Target cholesterol levels (in mg/dL) for most people are:  Total cholesterol below 200.  LDL cholesterol below 100.  HDL cholesterol above 40 in men and above 50 in women.  Triglycerides below 150. Keep your blood pressure under control Having high blood pressure (hypertension) puts you at risk for stroke and other forms of heart disease. Keep your blood pressure under control as recommended by your health care provider. Ask  your health care provider if you need treatment to lower your blood pressure. If you are 2418-69 years of age, have your blood pressure checked every 3-5 years. If you are 69 years of age or older, have your blood pressure checked every year. Do not use tobacco products Tobacco smoke can damage your heart and blood vessels. Do not use any tobacco products including cigarettes, chewing tobacco, or electronic cigarettes. If you need help quitting, ask your health care provider. Take medicines as directed Take medicines only as directed by your health care provider. Ask your health care provider whether you should take an aspirin every day. Taking aspirin can help reduce your risk of heart disease and stroke. Where to find more information: To find out more about heart disease, visit the American Heart Association's website at www.americanheart.org This information is not intended to replace advice given to you by your health care provider. Make sure you discuss any questions you have with your health care provider. Document Released: 11/07/2003 Document Revised: 08/23/2015 Document Reviewed: 05/19/2013 Elsevier Interactive Patient Education  2017 [employer]. Blood Glucose Monitoring, Adult Monitoring your blood sugar (glucose) helps you manage your diabetes. It also helps you and your health care provider determine how well your diabetes management plan is working. Blood glucose monitoring involves checking your blood glucose as often as directed, and keeping a record (log) of your results over time. Why should I monitor my blood glucose? Checking your blood glucose regularly can:  Help you understand how food, exercise, illnesses, and medicines affect your blood glucose.  Let you know what your blood glucose is at any time. You can quickly tell if you are having  low blood glucose (hypoglycemia) or high blood glucose (hyperglycemia).  Help you and your health care provider adjust your medicines as  needed. When should I check my blood glucose? Follow instructions from your health care provider about how often to check your blood glucose. This may depend on:  The type of diabetes you have.  How well-controlled your diabetes is.  Medicines you are taking. If you have type 1 diabetes:  Check your blood glucose at least 2 times a day.  Also check your blood glucose:  Before every insulin injection.  Before and after exercise.  Between meals.  2 hours after a meal.  Occasionally between 2:00 a.m. and 3:00 a.m., as directed.  Before potentially dangerous tasks, like driving or using heavy machinery.  At bedtime.  You may need to check your blood glucose more often, up to 6-10 times a day:  If you use an insulin pump.  If you need multiple daily injections (MDI).  If your diabetes is not well-controlled.  If you are ill.  If you have a history of severe hypoglycemia.  If you have a history of not knowing when your blood glucose is getting low (hypoglycemia unawareness). If you have type 2 diabetes:  If you take insulin or other diabetes medicines, check your blood glucose at least 2 times a day.  If you are on intensive insulin therapy, check your blood glucose at least 4 times a day. Occasionally, you may also need to check between 2:00 a.m. and 3:00 a.m., as directed.  Also check your blood glucose:  Before and after exercise.  Before potentially dangerous tasks, like driving or using heavy machinery.  You may need to check your blood glucose more often if:  Your medicine is being adjusted.  Your diabetes is not well-controlled.  You are ill. What is a blood glucose log?  A blood glucose log is a record of your blood glucose readings. It helps you and your health care provider:  Look for patterns in your blood glucose over time.  Adjust your diabetes management plan as needed.  Every time you check your blood glucose, write down your result and  notes about things that may be affecting your blood glucose, such as your diet and exercise for the day.  Most glucose meters store a record of glucose readings in the meter. Some meters allow you to download your records to a computer. How do I check my blood glucose? Follow these steps to get accurate readings of your blood glucose: Supplies needed   Blood glucose meter.  Test strips for your meter. Each meter has its own strips. You must use the strips that come with your meter.  A needle to prick your finger (lancet). Do not use lancets more than once.  A device that holds the lancet (lancing device).  A journal or log book to write down your results. Procedure  Wash your hands with soap and water.  Prick the side of your finger (not the tip) with the lancet. Use a different finger each time.  Gently rub the finger until a small drop of blood appears.  Follow instructions that come with your meter for inserting the test strip, applying blood to the strip, and using your blood glucose meter.  Write down your result and any notes. Alternative testing sites  Some meters allow you to use areas of your body other than your finger (alternative sites) to test your blood.  If you think you may have hypoglycemia,  or if you have hypoglycemia unawareness, do not use alternative sites. Use your finger instead.  Alternative sites may not be as accurate as the fingers, because blood flow is slower in these areas. This means that the result you get may be delayed, and it may be different from the result that you would get from your finger.  The most common alternative sites are:  Forearm.  Thigh.  Palm of the hand. Additional tips  Always keep your supplies with you.  If you have questions or need help, all blood glucose meters have a 24-hour hotline number that you can call. You may also contact your health care provider.  After you use a few boxes of test strips, adjust  (calibrate) your blood glucose meter by following instructions that came with your meter. This information is not intended to replace advice given to you by your health care provider. Make sure you discuss any questions you have with your health care provider. Document Released: 03/28/2003 Document Revised: 10/13/2015 Document Reviewed: 09/04/2015 Elsevier Interactive Patient Education  2017 [employer].

## 2016-02-25 NOTE — Progress Notes (Signed)
Patient had an episode of hematuria followed by amber color urine. He states that happens when he he holds his urine.Ttherfore advised not to hold his urine. Dr Arlyss Queen (on call ) was notified via text. monitering patient as denying pain or discomfort.

## 2016-02-25 NOTE — Progress Notes (Signed)
I have sent a message to our [facility] office's scheduler requesting the following - TOC appt one week and Dr Antoine Poche 8 weeks; BMET 11/22 - and our office will call the patient with this information. I put BMET order in. Also wrote on discharge instructions for patient to come to office in AM on 11/22 for this labwork, and that our office will call him with his f/u appts. Yeraldi Fidler PA-C

## 2016-02-25 NOTE — Progress Notes (Signed)
Discharge instructions reviewed with patient, questions answered, verbalized understanding.  Patients friend in for transportation home.

## 2016-02-25 NOTE — Care Management (Signed)
CM spoke with patient at the bedside. Provided patient with a 30 day free trial card for Xarelto. Encouraged patient to discuss concerns about continuing Xarelto due to the cost with his cardiologist during his follow-up appointment (per Dr. Laural Benes). CM provided patient with a print out of instructions to apply for [facility] and [facility]. Rubie Maid RN BSN CCM

## 2016-02-25 NOTE — Discharge Summary (Signed)
Physician Discharge Summary  Leroy [name] ZOX:[id number] DOB: Jan 07, 1946 DOA: 02/22/2016  PCP: [name], MD  Admit date: 02/22/2016 Discharge date: 02/25/2016  Admitted From: [facility]  Disposition:  Home  Recommendations for Outpatient Follow-up:  1. Follow up with cardiology and PCP later in week  2. Please obtain BMP/CBC later this week as scheduled thru cardiology  Discharge Condition: Stable  CODE STATUS: FULL Diet recommendation: Heart Healthy / Carb Modified   Brief/Interim Summary: HPI: Leroy [name] is a 70 y.o. male with medical history significant of CVA, atrial flutter, CHF, CAD status post CABG, DM, DVT, HLD, hypotension PVD status post bilateral BKA, presenting with right-sided chest pain. Acute onset while resting on the couch watching TV. Constant. Associated with diaphoresis. Described as a swelling pain in his chest. States that from time to time he has reflux symptoms which sometimes feels somewhat similar to his current episode. Relieved with Aspirin and nitroglycerin. Patient states that he took a lisinopril overnight when chest pain started without any relief of pain. Patient is supposed to be taking Xarelto for blood clots but has not taken it in several months due to cost. Chest pain does not radiate to neck or arm is not associated with movement or deep respirations and denies palpitations, shortness of breath, nausea, vomiting, abdominal pain, fevers, cough.  ED Course: NItro w/ relief of CP. Cardiology consulted who deferred admission to Hospitalist  NSTEMI (non-ST elevated myocardial infarction) [facility]) in a patient with known CAD s/p CABG in 2000/Chest pain Placed on a heparin and NTG drip on admission. Underwent cardiac cath 02/22/16. 100% stenosis of LAD noted, s/p angioplasty. The 1st & 2nd OM branches as well as the RCA also were 100% stenosed, But patent graft to several OM branches. Continue ASA, heparin drip, NTG drip and Brilinta. Continue statin, beta  blocker and ACE inhibitor. Medical management recommended per cardiology.    Noncompliance Case manager to assist with medications.    Hyperlipidemia Continue Zetia and Crestor.    ATRIAL FIBRILLATION, PAROXYSMAL Has been maintaining sinus rhythm with a left bundle branch block on telemetry. Cardiology recommends resuming Xarelto 02/24/16 if Site stable in left groin.  Asked Care manager to assist patient with medication as he says he was having trouble affording the medication.     H/O CVA Continue risk factor modification. Started on plavix by cardiology.      S/P BKA (below knee amputation) bilateral (HCC) Ambulates w/ prosthesis.     Chronic systolic heart failure (HCC)/Ischemic cardiomyopathy EF was 25-30% in February of this year. Repeat echocardiogram done 02/23/16. Continue metoprolol and lisinopril. Appears to be compensated.  Repeat echo in 3 months with cardiology.     Diabetes mellitus type 2 with peripheral artery disease (HCC) Metformin on hold due to renal insufficiency and not restarted at discharge.  continue lantus therapy CBG (last 3)   Recent Labs (last 2 labs)    Recent Labs  02/23/16 1731 02/23/16 2122 02/24/16 0644  GLUCAP 201* 237* 77       Essential hypertension Continue Lisinopril and metoprolol.    Family Communication/Anticipated D/C date and plan/Code Status   DVT prophylaxis: Currently on Brilinta. Code Status: Full Code.  Family Communication: No family at the bedside. Disposition Plan: Home   Discharge Diagnoses:  Principal Problem:   NSTEMI (non-ST elevated myocardial infarction) (HCC) Active Problems:   ATRIAL FIBRILLATION, PAROXYSMAL   S/P BKA (below knee amputation) bilateral (HCC)   Chronic systolic heart failure (HCC)   Diabetes mellitus  type 2 with peripheral artery disease (HCC)   Chest pain, atypical   Essential hypertension   Noncompliance   SOB (shortness of breath)  Discharge Instructions  Discharge  Instructions    Amb Referral to Cardiac Rehabilitation    Complete by:  As directed    Diagnosis:  NSTEMI   Increase activity slowly    Complete by:  As directed        Medication List    STOP taking these medications   aspirin EC 81 MG tablet   Canagliflozin-Metformin HCl 50-1000 MG Tabs   lisinopril 5 MG tablet Commonly known as:  PRINIVIL,ZESTRIL     TAKE these medications   clopidogrel 75 MG tablet Commonly known as:  PLAVIX Take 1 tablet (75 mg total) by mouth daily.   ezetimibe 10 MG tablet Commonly known as:  ZETIA Take 1 tablet (10 mg total) by mouth daily.   insulin aspart 100 UNIT/ML injection Commonly known as:  novoLOG Inject 10 Units into the skin 3 (three) times daily as needed for high blood sugar (BLOOD SUGARS GREATER THAN 200).   Insulin Glargine 100 UNIT/ML Solostar Pen Commonly known as:  LANTUS Inject 50 Units into the skin 2 (two) times daily.   Insulin Pen Needle 31G X 5 MM Misc Commonly known as:  B-D UF III MINI PEN NEEDLES Use to inject insulin as directed.   losartan 25 MG tablet Commonly known as:  COZAAR Take 1 tablet (25 mg total) by mouth daily. Start taking on:  02/26/2016   metoprolol succinate 50 MG 24 hr tablet Commonly known as:  TOPROL-XL Take 1 tablet (50 mg total) by mouth 2 (two) times daily before a meal.   nitroGLYCERIN 0.4 MG SL tablet Commonly known as:  NITROSTAT Place 1 tablet (0.4 mg total) under the tongue every 5 (five) minutes as needed for chest pain.   rivaroxaban 20 MG Tabs tablet Commonly known as:  XARELTO Take 1 tablet (20 mg total) by mouth daily at 6 PM.   rosuvastatin 20 MG tablet Commonly known as:  CRESTOR Take 1 tablet (20 mg total) by mouth at bedtime.      Follow-up Information    Verne Carrow, MD Follow up.   Specialty:  Cardiology Why:  [facility] - Please come to the [facility] OFFICE office Wednesday 11/22 for repeat labwork (come in the morning -open at 7:30). The  office will call you for your follow-up appointments. Contact information: [address] [place] [address] [phone]        [name], MD. Schedule an appointment as soon as possible for a visit in 1 week(s).   Specialty:  Internal Medicine Why:  Hospital Follow Up  Contact information: [address] [place] [address] [phone]          Allergies  Allergen Reactions  . Statins Other (See Comments)    Reaction:  Muscle pain   . Amlodipine Besylate Other (See Comments)    Reaction:  Muscle pain   . Cephalexin Hives    Procedures/Studies: Dg Chest Port 1 View  Result Date: 02/22/2016 CLINICAL DATA:  70 y/o  M; shortness of breath. EXAM: PORTABLE CHEST 1 VIEW COMPARISON:  02/22/2016 chest radiograph FINDINGS: The heart size and mediastinal contours are within normal limits and stable. Both lungs are clear. The visualized skeletal structures are unremarkable. IMPRESSION: No active disease. Electronically Signed   By: Mitzi Hansen M.D.   On: 02/22/2016 20:32  Dg Chest Portable 1 View  Result Date: 02/22/2016 CLINICAL DATA:  Chest pain tonight EXAM: PORTABLE CHEST 1 VIEW COMPARISON:  11/13/2015 FINDINGS: A single AP portable view of the chest demonstrates no focal airspace consolidation or alveolar edema. The lungs are grossly clear. There is no large effusion or pneumothorax. Cardiac and mediastinal contours appear unremarkable. IMPRESSION: No active disease. Electronically Signed   By: Ellery Plunk M.D.   On: 02/22/2016 04:51    Subjective: Pt without complaints today, cleared by cardiology to go home, follow up with them later this week in office arranged  Discharge Exam: Vitals:   02/25/16 0634 02/25/16 0944  BP: 105/64 118/78  Pulse: (!) 101 (!) 105  Resp: 18   Temp: 98.7 F (37.1 C)    Vitals:   02/24/16 2037 02/25/16 0258 02/25/16 0634 02/25/16 0944  BP: (!) 92/47 108/70 105/64 118/78  Pulse: (!)  104 95 (!) 101 (!) 105  Resp: 18 18 18    Temp: 97.4 F (36.3 C) 99.1 F (37.3 C) 98.7 F (37.1 C)   TempSrc: Oral Oral Oral   SpO2: 96% 100% 96% 100%  Weight:   127.1 kg (280 lb 1.6 oz)   Height:       General: Well developed, well nourished, in no acute distress. Alert and oriented x 3.  Psych:  Good affect, responds appropriately Neck: Supple, no JVD Lungs: CTA Bilateral  Heart: RRR  Abdomen: Bowel sounds are present. Soft, non-tender. Left groin with no hematoma and no bruit Extremities: Bilateral BKA   The results of significant diagnostics from this hospitalization (including imaging, microbiology, ancillary and laboratory) are listed below for reference.     Microbiology: Recent Results (from the past 240 hour(s))  MRSA PCR Screening     Status: None   Collection Time: 02/22/16  5:35 PM  Result Value Ref Range Status   MRSA by PCR NEGATIVE NEGATIVE Final    Comment:        The GeneXpert MRSA Assay (FDA approved for NASAL specimens only), is one component of a comprehensive MRSA colonization surveillance program. It is not intended to diagnose MRSA infection nor to guide or monitor treatment for MRSA infections.      Labs: BNP (last 3 results)  Recent Labs  10/22/15 2119 02/22/16 0403  BNP 147.3* 157.6*   Basic Metabolic Panel:  Recent Labs Lab 02/22/16 0403 02/23/16 0547 02/24/16 0536 02/25/16 0327  NA 133* 137 137 134*  K 3.9 4.0 3.3* 4.9  CL 102 105 105 105  CO2 23 22 22  19*  GLUCOSE 314* 172* 97 115*  BUN 9 9 17 20   CREATININE 1.02 1.09 1.57* 1.61*  CALCIUM 8.7* 8.6* 8.7* 8.7*   Liver Function Tests:  Recent Labs Lab 02/22/16 0403  AST 29  ALT 26  ALKPHOS 107  BILITOT 0.6  PROT 7.0  ALBUMIN 3.5   No results for input(s): LIPASE, AMYLASE in the last 168 hours. No results for input(s): AMMONIA in the last 168 hours. CBC:  Recent Labs Lab 02/22/16 0403 02/23/16 0547 02/24/16 0536 02/25/16 0327  WBC 6.8 11.0* 16.2* 16.5*   NEUTROABS 3.5  --   --   --   HGB 16.2 16.6 14.8 14.6  HCT 46.4 47.9 43.1 41.4  MCV 85.5 86.6 86.4 85.9  PLT 201 189 189 220   Cardiac Enzymes:  Recent Labs Lab 02/22/16 0403 02/22/16 0843 02/22/16 1224 02/22/16 1828  TROPONINI 0.10* 3.62* 21.36* 56.86*   BNP: Invalid input(s): POCBNP CBG:  Recent Labs Lab 02/24/16 1142 02/24/16 1640 02/24/16 2245 02/25/16 0625 02/25/16 1145  GLUCAP 121* 131* 148* 72 175*   D-Dimer No results for input(s): DDIMER in the last 72 hours. Hgb A1c No results for input(s): HGBA1C in the last 72 hours. Lipid Profile No results for input(s): CHOL, HDL, LDLCALC, TRIG, CHOLHDL, LDLDIRECT in the last 72 hours. Thyroid function studies No results for input(s): TSH, T4TOTAL, T3FREE, THYROIDAB in the last 72 hours.  Invalid input(s): FREET3 Anemia work up No results for input(s): VITAMINB12, FOLATE, FERRITIN, TIBC, IRON, RETICCTPCT in the last 72 hours. Urinalysis    Component Value Date/Time   COLORURINE YELLOW 11/13/2015 1148   APPEARANCEUR CLEAR 11/13/2015 1148   LABSPEC <=1.005 (A) 11/13/2015 1148   PHURINE 6.0 11/13/2015 1148   GLUCOSEU NEGATIVE 11/13/2015 1148   HGBUR SMALL (A) 11/13/2015 1148   BILIRUBINUR NEGATIVE 11/13/2015 1148   KETONESUR NEGATIVE 11/13/2015 1148   PROTEINUR 100 (A) 10/22/2015 2336   UROBILINOGEN 0.2 11/13/2015 1148   NITRITE NEGATIVE 11/13/2015 1148   LEUKOCYTESUR NEGATIVE 11/13/2015 1148   Sepsis Labs Invalid input(s): PROCALCITONIN,  WBC,  LACTICIDVEN Microbiology Recent Results (from the past 240 hour(s))  MRSA PCR Screening     Status: None   Collection Time: 02/22/16  5:35 PM  Result Value Ref Range Status   MRSA by PCR NEGATIVE NEGATIVE Final    Comment:        The GeneXpert MRSA Assay (FDA approved for NASAL specimens only), is one component of a comprehensive MRSA colonization surveillance program. It is not intended to diagnose MRSA infection nor to guide or monitor treatment for MRSA  infections.    Time coordinating discharge: 27 minutes  SIGNED: Standley Dakinslanford Amber Guthridge, MD  [facility] 02/25/2016, 12:11 PM Pager   If 7PM-7AM, please contact night-coverage [email or web address] Password [email or web address]

## 2016-02-25 NOTE — Progress Notes (Signed)
SUBJECTIVE: No chest pain; mild dyspnea (baseline per pt)  Tele: sinus with PVCs  BP 118/78 (BP Location: Right Arm)   Pulse (!) 105   Temp 98.7 F (37.1 C) (Oral)   Resp 18   Ht 5\' 9"  (1.753 m)   Wt 280 lb 1.6 oz (127.1 kg)   SpO2 100%   BMI 41.36 kg/m   Intake/Output Summary (Last 24 hours) at 02/25/16 1042 Last data filed at 02/25/16 0900  Gross per 24 hour  Intake              840 ml  Output              652 ml  Net              188 ml    PHYSICAL EXAM General: Well developed, well nourished, in no acute distress. Alert and oriented x 3.  Psych:  Good affect, responds appropriately Neck: Supple Lungs: CTA Heart: RRR  Abdomen: Bowel sounds are present. Soft, non-tender. Left groin with no hematoma and no bruit Extremities: Bilateral BKA  LABS: Basic Metabolic Panel:  Recent Labs  16/01/9610/18/17 0536 02/25/16 0327  NA 137 134*  K 3.3* 4.9  CL 105 105  CO2 22 19*  GLUCOSE 97 115*  BUN 17 20  CREATININE 1.57* 1.61*  CALCIUM 8.7* 8.7*   CBC:  Recent Labs  02/24/16 0536 02/25/16 0327  WBC 16.2* 16.5*  HGB 14.8 14.6  HCT 43.1 41.4  MCV 86.4 85.9  PLT 189 220   Cardiac Enzymes:  Recent Labs  02/22/16 1224 02/22/16 1828  TROPONINI 21.36* 56.86*   Current Meds: . aspirin  81 mg Oral Daily  . ezetimibe  10 mg Oral Daily  . insulin aspart  0-15 Units Subcutaneous TID WC  . insulin aspart  0-5 Units Subcutaneous QHS  . insulin glargine  50 Units Subcutaneous BID  . lisinopril  5 mg Oral Daily  . metoprolol succinate  50 mg Oral BID AC  . polyethylene glycol  17 g Oral Daily  . rivaroxaban  20 mg Oral Q supper  . rosuvastatin  20 mg Oral QHS  . sodium chloride flush  3 mL Intravenous Q12H  . ticagrelor  90 mg Oral BID     ASSESSMENT AND PLAN:  1. CAD/NSTEMI: Pt with known history of CAD s/p CABG. Admitted with chest pain/NSTEMI. Cardiac cath per Dr. Eldridge DaceVaranasi with occluded LIMA graft to LAD. The native LAD was also occluded at the ostium.  The native LAD was opened with balloon angioplasty. Patent vein graft to several OM branches. The native RCA is occluded and the vein graft to the RCA is occluded (known from last cath in 2011).  Continue medical therapy. No stent was placed. Will DC ASA and brilinta given need for xarelto and instead treat with plavix 75 mg daily; continue statin, beta blocker; change ACEI to cozaar 25 mg daily; pt states lisinopril is making him wheeze.   2. DM: Per primary team.   3. Ischemic cardiomyopathy: Continue coreg; change lisinopril to cozaar 25 mg daily. EF severely reduced. He will need follow-up echocardiogram in approximately 3 months. May require ICD if LV function does not improve. He will need close follow up with Dr. Antoine PocheHochrein after discharge.   4. Atrial fib, paroxysmal: He is in sinus. Restarted xarelto  5. Acute kidney disease-creatinine is unchange today. Probable mild contrast nephropathy. Recheck renal function Wed 11/22.  Pt can be DCed from a  cardiac standpoint and FU with TOC appt one week and Dr Antoine Poche 8 weeks; BMET 11/22.  Olga Millers  11/19/201710:42 AM

## 2016-02-27 ENCOUNTER — Telehealth: Payer: Self-pay

## 2016-02-27 NOTE — Telephone Encounter (Signed)
Transition Care Management Follow-up Telephone Call How have you been since you were released from the hospital? Pt says he's "alright"  Do you understand why you were in the hospital? Yes   Do you understand the discharge instrcutions? Yes  Items Reviewed:  Medications reviewed: Yes  Allergies reviewed: Yes  Dietary changes reviewed: Yes  Referrals reviewed: Yes   Functional Questionnaire:   Activities of Daily Living (ADLs):   Pt states he is very weak but tries to get up and move around as much as possible   Any transportation issues/concerns?: No   Any patient concerns? No   Confirmed importance and date/time of follow-up visits scheduled: Yes   Confirmed with patient if condition begins to worsen call PCP or go to the ER.  Patient was given the Call-a-Nurse line [phone]: Yes

## 2016-03-04 ENCOUNTER — Other Ambulatory Visit (INDEPENDENT_AMBULATORY_CARE_PROVIDER_SITE_OTHER): Payer: Commercial Managed Care - HMO

## 2016-03-04 ENCOUNTER — Encounter: Payer: Self-pay | Admitting: Family

## 2016-03-04 ENCOUNTER — Ambulatory Visit (INDEPENDENT_AMBULATORY_CARE_PROVIDER_SITE_OTHER): Payer: Commercial Managed Care - HMO | Admitting: Family

## 2016-03-04 VITALS — HR 82 | Temp 98.0°F | Resp 20 | Ht 69.0 in

## 2016-03-04 DIAGNOSIS — E1151 Type 2 diabetes mellitus with diabetic peripheral angiopathy without gangrene: Secondary | ICD-10-CM

## 2016-03-04 DIAGNOSIS — I5022 Chronic systolic (congestive) heart failure: Secondary | ICD-10-CM | POA: Diagnosis not present

## 2016-03-04 DIAGNOSIS — I480 Paroxysmal atrial fibrillation: Secondary | ICD-10-CM | POA: Diagnosis not present

## 2016-03-04 DIAGNOSIS — I25709 Atherosclerosis of coronary artery bypass graft(s), unspecified, with unspecified angina pectoris: Secondary | ICD-10-CM

## 2016-03-04 LAB — COMPREHENSIVE METABOLIC PANEL
ALT: 40 U/L (ref 0–53)
AST: 20 U/L (ref 0–37)
Albumin: 3.3 g/dL — ABNORMAL LOW (ref 3.5–5.2)
Alkaline Phosphatase: 168 U/L — ABNORMAL HIGH (ref 39–117)
BILIRUBIN TOTAL: 0.3 mg/dL (ref 0.2–1.2)
BUN: 16 mg/dL (ref 6–23)
CO2: 29 meq/L (ref 19–32)
CREATININE: 1.35 mg/dL (ref 0.40–1.50)
Calcium: 8.9 mg/dL (ref 8.4–10.5)
Chloride: 103 mEq/L (ref 96–112)
GFR: 67.37 mL/min (ref >60.00)
GLUCOSE: 121 mg/dL — AB (ref 70–99)
Potassium: 4.6 mEq/L (ref 3.5–5.1)
SODIUM: 138 meq/L (ref 135–145)
Total Protein: 6.8 g/dL (ref 6.0–8.3)

## 2016-03-04 LAB — CBC
HCT: 42.6 % (ref 39.0–52.0)
Hemoglobin: 14.2 g/dL (ref 13.0–17.0)
MCHC: 33.3 g/dL (ref 30.0–36.0)
MCV: 87.5 fl (ref 78.0–100.0)
PLATELETS: 284 10*3/uL (ref 150.0–400.0)
RBC: 4.88 Mil/uL (ref 4.22–5.81)
RDW: 14.3 % (ref 11.5–15.5)
WBC: 5.9 10*3/uL (ref 4.0–10.5)

## 2016-03-04 NOTE — Progress Notes (Signed)
Subjective:    Patient ID: Leroy GriffithsWilliam J Dolle, male    DOB: [birth year], 70 y.o.   MRN: [id number]  Chief Complaint  Patient presents with  . Hospitalization Follow-up    feeling weak,     HPI:  Leroy [name] is a 70 y.o. male who  has a past medical history of Atrial flutter (HCC); Cataract; Chronic kidney disease; Chronic systolic CHF (congestive heart failure) (HCC); Coronary artery disease; Diabetes mellitus (1979); DVT (deep venous thrombosis) (HCC) (2011); H/O hiatal hernia; H/O medication noncompliance; History of blood clots; Hyperlipidemia; Hypotension; Ischemic cardiomyopathy; Joint pain; Myocardial infarction; Paroxysmal atrial fibrillation (HCC); Peripheral neuropathy (HCC); Peripheral vascular disease (HCC); Shortness of breath dyspnea; and Stroke (HCC). and presents today   Shortness of breath/NSTEMI - recently evaluated in the emergency department and admitted to the hospital with right-sided chest pain starting approximately a few hours prior to presentation with associated diaphoresis. He was given 324 mg of aspirin prior to arrival with mild relief. Previously prescribed Xarelto, however was unable to take medication secondary to financial concerns. EKG showed sinus tachycardia with left bundle branch block which was similar to previous. Troponin was 0.10. The B Natriuretic peptide was 157.6. Noted to be pain free following 2 nitroglycerin sprays. He was admitted to the hospital with an NSTEMI. He was placed on heparin and nitroglycerin drip on admission and underwent a cardiac catheterization which showed 100% stenosis of the LAD. The first and second OM branches as well as RCA were also 100% stenosed. Cardiology recommended medical management. Atrial fibrillation anticoagulated with Xarelto. Chronic systolic heart failure with previous ejection fraction of 25-30% in February with repeat echocardiogram showing 10-15% ejection fraction. Appears to be well compensated. Diabetes with  metformin on hold secondary to renal insufficiency and continued on Lantus therapy. All hospital records, labs, and imaging were reviewed in detail.  Since leaving the hospital he reports that he has been feeling weak and having difficulty sleeping. No further episodes of chest pain but continues to experience shortness of breath. Indicates that he is taking his medications as prescribed. Not currently checking his blood sugars at home. [facility] and denies adverse side effects or nuisance bleeding. Continues to stay by himself and indicates he does what he is able to do for his activities of daily living.    Allergies  Allergen Reactions  . Statins Other (See Comments)    Reaction:  Muscle pain   . Amlodipine Besylate Other (See Comments)    Reaction:  Muscle pain   . Cephalexin Hives      Outpatient Medications Prior to Visit  Medication Sig Dispense Refill  . clopidogrel (PLAVIX) 75 MG tablet Take 1 tablet (75 mg total) by mouth daily. 30 tablet 0  . ezetimibe (ZETIA) 10 MG tablet Take 1 tablet (10 mg total) by mouth daily. 30 tablet 1  . insulin aspart (NOVOLOG) 100 UNIT/ML injection Inject 10 Units into the skin 3 (three) times daily as needed for high blood sugar (BLOOD SUGARS GREATER THAN 200).     . Insulin Glargine (LANTUS) 100 UNIT/ML Solostar Pen Inject 50 Units into the skin 2 (two) times daily. 15 mL 3  . Insulin Pen Needle (B-D UF III MINI PEN NEEDLES) 31G X 5 MM MISC Use to inject insulin as directed. 270 each 3  . losartan (COZAAR) 25 MG tablet Take 1 tablet (25 mg total) by mouth daily. 30 tablet 0  . metoprolol succinate (TOPROL-XL) 50 MG 24 hr  tablet Take 1 tablet (50 mg total) by mouth 2 (two) times daily before a meal. 180 tablet 3  . nitroGLYCERIN (NITROSTAT) 0.4 MG SL tablet Place 1 tablet (0.4 mg total) under the tongue every 5 (five) minutes as needed for chest pain. 30 tablet 12  . rivaroxaban (XARELTO) 20 MG TABS tablet Take 1 tablet  (20 mg total) by mouth daily at 6 PM. 30 tablet 0  . rosuvastatin (CRESTOR) 20 MG tablet Take 1 tablet (20 mg total) by mouth at bedtime. 90 tablet 3   No facility-administered medications prior to visit.       Past Surgical History:  Procedure Laterality Date  . ABDOMINAL AORTAGRAM  04-30-12  . ABDOMINAL AORTAGRAM N/A 04/30/2012   Procedure: ABDOMINAL Ronny Flurry;  Surgeon: [name], MD;  Location: [facility] CATH LAB;  Service: Cardiovascular;  Laterality: N/A;  . ADENOIDECTOMY    . AMPUTATION Left 06/03/2012   Procedure: AMPUTATION BELOW KNEE;  Surgeon: [name], MD;  Location: [facility] OR;  Service: Vascular;  Laterality: Left;  . AMPUTATION Right 04/14/2013   Procedure: AMPUTATION BELOW KNEE ;  Surgeon: [name], MD;  Location: [facility] OR;  Service: Vascular;  Laterality: Right;  . APPLICATION OF WOUND VAC Right 04/20/2014   Procedure: APPLICATION OF WOUND VAC;  Surgeon: [name], MD;  Location: [facility] OR;  Service: Vascular;  Laterality: Right;  . CARDIAC CATHETERIZATION  03/19/10  . CARDIAC CATHETERIZATION N/A 02/22/2016   Procedure: Left Heart Cath and Cors/Grafts Angiography;  Surgeon: Corky Crafts, MD;  Location: [facility] INVASIVE CV LAB;  Service: Cardiovascular;  Laterality: N/A;  . CARDIAC CATHETERIZATION N/A 02/22/2016   Procedure: Coronary Stent Intervention;  Surgeon: Corky Crafts, MD;  Location: [facility] INVASIVE CV LAB;  Service: Cardiovascular;  Laterality: N/A;  . COLONOSCOPY WITH PROPOFOL N/A 10/24/2015   Procedure: COLONOSCOPY WITH PROPOFOL;  Surgeon: Rachael Fee, MD;  Location: [facility] ENDOSCOPY;  Service: Endoscopy;  Laterality: N/A;  . CORONARY ANGIOPLASTY  2002   3 stents  . CORONARY ARTERY BYPASS GRAFT  03/2000   quadruple  . ENDARTERECTOMY FEMORAL Right 03/25/2013   Procedure: ENDARTERECTOMY FEMORAL ;  Surgeon: [name], MD;  Location: [facility] OR;  Service: Vascular;  Laterality: Right;  . EYE SURGERY Right    Catarct  . HERNIA REPAIR     Hiatal Hernia  . I&D  EXTREMITY Right 04/14/2013   Procedure: IRRIGATION AND DEBRIDEMENT OF RIGHT  GROIN & PLACEMENT OF VAC DRESSING;  Surgeon: [name], MD;  Location: [facility] OR;  Service: Vascular;  Laterality: Right;  . I&D EXTREMITY Right 04/20/2014   Procedure: DEBRIDEMENT OF RIGHT BELOW KNEE AMPUTATION STUMP;  Surgeon: [name], MD;  Location: [facility] OR;  Service: Vascular;  Laterality: Right;  . LOWER EXTREMITY ANGIOGRAM Bilateral 04/30/2012   Procedure: LOWER EXTREMITY ANGIOGRAM;  Surgeon: [name], MD;  Location: [facility] CATH LAB;  Service: Cardiovascular;  Laterality: Bilateral;  bilat lower extrem angio  . PATCH ANGIOPLASTY Right 03/25/2013   Procedure: PATCH ANGIOPLASTY USING VASCU-GUARD PERIPHERAL VASCULAR PATCH;  Surgeon: [name], MD;  Location: [facility] OR;  Service: Vascular;  Laterality: Right;  . revasculariztion  2011/2010   x 2   . TONSILLECTOMY    . WOUND DEBRIDEMENT Right 04/20/2014   s/p  rt bka  with application of wound vac      Past Medical History:  Diagnosis Date  . Atrial flutter (HCC)   . Cataract   . Chronic kidney disease  on Lisinopril to protect kidneys d/t being on Metformin per pt  . Chronic systolic CHF (congestive heart failure) (HCC)   . Coronary artery disease    a. s/p remote CABG 2001 at [facility] b. Cath 03/2010: for med rx.;  c.  [name] myoview (1/14):  Large Inferior apical MI, very small area of peri-infarct ischemia toward the inferior apical segment. EF 19%.  Med Rx was continued.    . Diabetes mellitus 1979   takes Metformni daily  and Lantus 50units bid  . DVT (deep venous thrombosis) (HCC) 2011   legs  . H/O hiatal hernia   . H/O medication noncompliance    Due to insurance issues  . History of blood clots    in legs--this was in 2011--has been off of Coumadin 54months;takes Xarelto daily  . Hyperlipidemia   . Hypotension   . Ischemic cardiomyopathy    a. EF 40% 2010. b. Echo (2/14):  mild LVH, EF 30-35%, diff HK, Gr 1 DD, MAC, mild MR, mod LAE, PASP 39  .  Joint pain   . Myocardial infarction    total of 8 heart attacks;last one in 2011  . Paroxysmal atrial fibrillation (HCC)   . Peripheral neuropathy (HCC)   . Peripheral vascular disease (HCC)    a. s/p L BKA 2014.  [name] Shortness of breath dyspnea   . Stroke [facility])       Review of Systems  Constitutional: Positive for fatigue. Negative for chills and fever.  Respiratory: Positive for shortness of breath. Negative for chest tightness and wheezing.   Cardiovascular: Negative for chest pain and palpitations.      Objective:    Pulse 82   Temp 98 F (36.7 C) (Oral)   Resp 20   Ht 5\' 9"  (1.753 m)   SpO2 93%  Nursing note and vital signs reviewed.  BP Readings from Last 3 Encounters:  02/25/16 (!) 82/29  11/13/15 120/80  10/29/15 (!) 120/45    Physical Exam  Constitutional: He is oriented to person, place, and time. He appears well-developed and well-nourished.  Non-toxic appearance. He does not have a sickly appearance. He appears ill. No distress.  Seated in a wheelchair with bilateral BKA with prosthesis in place. Answers questions appropriately.   Cardiovascular: Normal rate, normal heart sounds and intact distal pulses.  An irregularly irregular rhythm present.  Pulmonary/Chest: Effort normal and breath sounds normal.  Neurological: He is alert and oriented to person, place, and time.  Skin: Skin is warm and dry.  Psychiatric: He has a normal mood and affect. His behavior is normal. Judgment and thought content normal.       Assessment & Plan:   Problem List Items Addressed This Visit      Cardiovascular and Mediastinum   Diabetes mellitus type 2 with peripheral artery disease (HCC) - Primary (Chronic)    Most recent A1c of 10.6 indicating uncontrolled diabetes. Patient does have significant history of noncompliance with medication regimen. Continue current dosage of Lantus and NovoLog. Encouraged to monitor blood sugars at home. Obtain complete metabolic panel to  recheck kidney function to determine appropriateness for metformin.      ATRIAL FIBRILLATION, PAROXYSMAL    Atrial fibrillation noted upon exam today and maintained on Xarelto for anticoagulation. Reports he has enough of the medication. May require social work consult to help with obtaining medication. Continue to monitor with changes per cardiology.       Relevant Orders   CBC (Completed)   Comprehensive metabolic  panel (Completed)   Chronic systolic heart failure (HCC)    Worsening systolic heart failure noted on most recent echocardiogram with now 10-15% ejection fraction. Shortness of breath and fatigue are most likely related to NSTEMI and decreased ejection fraction. Recommend follow up with cardiology. Maintained on metoprolol for sudden cardiac death risk. On losartan for risk reduction. Entresto does not appear appropriate secondary to decreased blood pressure. Continue Plavix and Xarelto.       Relevant Orders   CBC (Completed)   Comprehensive metabolic panel (Completed)   Coronary artery disease involving coronary bypass graft of native heart with angina pectoris (HCC)    Coronary artery disease with multiple stenosed coronary arteries on cardiac catheterization. No current chest pain. Continue to monitor through risk factor adjustment of hyperlipidemia, hypertension and type 2 diabetes.      Relevant Orders   CBC (Completed)   Comprehensive metabolic panel (Completed)       I am having Mr. Huston maintain his ezetimibe, nitroGLYCERIN, insulin aspart, metoprolol succinate, rosuvastatin, Insulin Pen Needle, Insulin Glargine, clopidogrel, losartan, and rivaroxaban.   Follow-up: Return in about 1 month (around 04/03/2016), or if symptoms worsen or fail to improve.  Jeanine Luz, FNP

## 2016-03-04 NOTE — Assessment & Plan Note (Addendum)
Coronary artery disease with multiple stenosed coronary arteries on cardiac catheterization. No current chest pain. Continue to monitor through risk factor adjustment of hyperlipidemia, hypertension and type 2 diabetes.

## 2016-03-04 NOTE — Assessment & Plan Note (Signed)
Atrial fibrillation noted upon exam today and maintained on Xarelto for anticoagulation. Reports he has enough of the medication. May require social work consult to help with obtaining medication. Continue to monitor with changes per cardiology.

## 2016-03-04 NOTE — Assessment & Plan Note (Signed)
Worsening systolic heart failure noted on most recent echocardiogram with now 10-15% ejection fraction. Shortness of breath and fatigue are most likely related to NSTEMI and decreased ejection fraction. Recommend follow up with cardiology. Maintained on metoprolol for sudden cardiac death risk. On losartan for risk reduction. Entresto does not appear appropriate secondary to decreased blood pressure. Continue Plavix and Xarelto.

## 2016-03-04 NOTE — Assessment & Plan Note (Signed)
Most recent A1c of 10.6 indicating uncontrolled diabetes. Patient does have significant history of noncompliance with medication regimen. Continue current dosage of Lantus and NovoLog. Encouraged to monitor blood sugars at home. Obtain complete metabolic panel to recheck kidney function to determine appropriateness for metformin.

## 2016-03-04 NOTE — Patient Instructions (Addendum)
Thank you for choosing [facility].  SUMMARY AND INSTRUCTIONS:  Please call the cardiology office to schedule a follow up appointment.  Eat frequent, small meals to help with energy.   Medication:  Please continue to take your medications as prescribed.   Your prescription(s) have been submitted to your pharmacy or been printed and provided for you. Please take as directed and contact our office if you believe you are having problem(s) with the medication(s) or have any questions.  Labs:  Please stop by the lab on the lower level of the building for your blood work. Your results will be released to MyChart (or called to you) after review, usually within 72 hours after test completion. If any changes need to be made, you will be notified at that same time.  1.) The lab is open from 7:30am to 5:30 pm Monday-Friday 2.) No appointment is necessary 3.) Fasting (if needed) is 6-8 hours after food and drink; black coffee and water are okay   Follow up:  If your symptoms worsen or fail to improve, please contact our office for further instruction, or in case of emergency go directly to the emergency room at the closest medical facility.

## 2016-03-06 ENCOUNTER — Ambulatory Visit: Payer: Commercial Managed Care - HMO

## 2016-03-22 ENCOUNTER — Encounter: Payer: Self-pay | Admitting: Cardiology

## 2016-03-22 ENCOUNTER — Ambulatory Visit (INDEPENDENT_AMBULATORY_CARE_PROVIDER_SITE_OTHER): Payer: Commercial Managed Care - HMO | Admitting: Cardiology

## 2016-03-22 VITALS — BP 140/63 | HR 102 | Ht 62.0 in | Wt 277.0 lb

## 2016-03-22 DIAGNOSIS — I5022 Chronic systolic (congestive) heart failure: Secondary | ICD-10-CM | POA: Diagnosis not present

## 2016-03-22 MED ORDER — METOPROLOL SUCCINATE ER 50 MG PO TB24: 50.0000 mg | ORAL_TABLET | Freq: Two times a day (BID) | ORAL | 9 refills | Status: DC

## 2016-03-22 MED ORDER — METOPROLOL SUCCINATE ER 25 MG PO TB24: 25.0000 mg | ORAL_TABLET | Freq: Two times a day (BID) | ORAL | 9 refills | Status: DC

## 2016-03-22 NOTE — Patient Instructions (Signed)
Medication Instructions:  Your physician has recommended you make the following change in your medication:  1. Increase Metoprolol Succinate (75 mg ) twice daily.  Will have to take a (50 mg ) tablet and a (25 mg ) tablet twice daily.    Labwork: -None  Testing/Procedures: -None  Follow-Up: Your physician recommends that you keep your scheduled follow-up appointment in February with Dr. Antoine PocheHochrein at our [place] office.    Any Other Special Instructions Will Be Listed Below (If Applicable).  Robbie LisBrittainy Simmons, PA stated researched Losartan and insomnia is not one of the side affects.    If you need a refill on your cardiac medications before your next appointment, please call your pharmacy.   Low-Sodium Eating Plan Sodium raises blood pressure and causes water to be held in the body. Getting less sodium from food will help lower your blood pressure, reduce any swelling, and protect your heart, liver, and kidneys. We get sodium by adding salt (sodium chloride) to food. Most of our sodium comes from canned, boxed, and frozen foods. Restaurant foods, fast foods, and pizza are also very high in sodium. Even if you take medicine to lower your blood pressure or to reduce fluid in your body, getting less sodium from your food is important. What is my plan? Most people should limit their sodium intake to 2,300 mg a day. Your health care provider recommends that you limit your sodium intake to __________ a day. What do I need to know about this eating plan? For the low-sodium eating plan, you will follow these general guidelines:  Choose foods with a % Daily Value for sodium of less than 5% (as listed on the food label).  Use salt-free seasonings or herbs instead of table salt or sea salt.  Check with your health care provider or pharmacist before using salt substitutes.  Eat fresh foods.  Eat more vegetables and fruits.  Limit canned vegetables. If you do use them, rinse them well to  decrease the sodium.  Limit cheese to 1 oz (28 g) per day.  Eat lower-sodium products, often labeled as "lower sodium" or "no salt added."  Avoid foods that contain monosodium glutamate (MSG). MSG is sometimes added to [place] food and some canned foods.  Check food labels (Nutrition Facts labels) on foods to learn how much sodium is in one serving.  Eat more home-cooked food and less restaurant, buffet, and fast food.  When eating at a restaurant, ask that your food be prepared with less salt, or no salt if possible. How do I read food labels for sodium information? The Nutrition Facts label lists the amount of sodium in one serving of the food. If you eat more than one serving, you must multiply the listed amount of sodium by the number of servings. Food labels may also identify foods as:  Sodium free-Less than 5 mg in a serving.  Very low sodium-35 mg or less in a serving.  Low sodium-140 mg or less in a serving.  Light in sodium-50% less sodium in a serving. For example, if a food that usually has 300 mg of sodium is changed to become light in sodium, it will have 150 mg of sodium.  Reduced sodium-25% less sodium in a serving. For example, if a food that usually has 400 mg of sodium is changed to reduced sodium, it will have 300 mg of sodium. What foods can I eat? Grains  Low-sodium cereals, including oats, puffed wheat and rice, and shredded wheat cereals.  Low-sodium crackers. Unsalted rice and pasta. Lower-sodium bread. Vegetables  Frozen or fresh vegetables. Low-sodium or reduced-sodium canned vegetables. Low-sodium or reduced-sodium tomato sauce and paste. Low-sodium or reduced-sodium tomato and vegetable juices. Fruits  Fresh, frozen, and canned fruit. Fruit juice. Meat and Other Protein Products  Low-sodium canned tuna and salmon. Fresh or frozen meat, poultry, seafood, and fish. Lamb. Unsalted nuts. Dried beans, peas, and lentils without added salt. Unsalted canned beans.  Homemade soups without salt. Eggs. Dairy  Milk. Soy milk. Ricotta cheese. Low-sodium or reduced-sodium cheeses. Yogurt. Condiments  Fresh and dried herbs and spices. Salt-free seasonings. Onion and garlic powders. Low-sodium varieties of mustard and ketchup. Fresh or refrigerated horseradish. Lemon juice. Fats and Oils  Reduced-sodium salad dressings. Unsalted butter. Other  Unsalted popcorn and pretzels. The items listed above may not be a complete list of recommended foods or beverages. Contact your dietitian for more options.  What foods are not recommended? Grains  Instant hot cereals. Bread stuffing, pancake, and biscuit mixes. Croutons. Seasoned rice or pasta mixes. Noodle soup cups. Boxed or frozen macaroni and cheese. Self-rising flour. Regular salted crackers. Vegetables  Regular canned vegetables. Regular canned tomato sauce and paste. Regular tomato and vegetable juices. Frozen vegetables in sauces. Salted [place] fries. Olives. Rosita Fire. Relishes. Sauerkraut. Salsa. Meat and Other Protein Products  Salted, canned, smoked, spiced, or pickled meats, seafood, or fish. Bacon, ham, sausage, hot dogs, corned beef, chipped beef, and packaged luncheon meats. Salt pork. Jerky. Pickled herring. Anchovies, regular canned tuna, and sardines. Salted nuts. Dairy  Processed cheese and cheese spreads. Cheese curds. Blue cheese and cottage cheese. Buttermilk. Condiments  Onion and garlic salt, seasoned salt, table salt, and sea salt. Canned and packaged gravies. Worcestershire sauce. Tartar sauce. Barbecue sauce. Teriyaki sauce. Soy sauce, including reduced sodium. Steak sauce. Fish sauce. Oyster sauce. Cocktail sauce. Horseradish that you find on the shelf. Regular ketchup and mustard. Meat flavorings and tenderizers. Bouillon cubes. Hot sauce. Tabasco sauce. Marinades. Taco seasonings. Relishes. Fats and Oils  Regular salad dressings. Salted butter. Margarine. Ghee. Bacon fat. Other  Potato and  tortilla chips. Corn chips and puffs. Salted popcorn and pretzels. Canned or dried soups. Pizza. Frozen entrees and pot pies. The items listed above may not be a complete list of foods and beverages to avoid. Contact your dietitian for more information.  This information is not intended to replace advice given to you by your health care provider. Make sure you discuss any questions you have with your health care provider. Document Released: 09/14/2001 Document Revised: 08/31/2015 Document Reviewed: 01/27/2013 Elsevier Interactive Patient Education  2017 [employer].

## 2016-03-22 NOTE — Progress Notes (Signed)
03/22/2016 Leroy GriffithsWilliam J [name]   [birth year]  [id number]  Primary Physician [name], MD Primary Cardiologist: Dr. Antoine PocheHochrein   Reason [facility] Visit/CC: [facility] + Ischemic Cardiomyopathy   HPI:  Leroy [name] presents to clinic for post hospital f/u. He is followed by Dr. Eldridge DaceVaranasi. He is a 70 y/o male with known history of CAD s/p CABG. Recently admitted in November with chest pain/NSTEMI. Cardiac cath per Dr. Eldridge DaceVaranasi with occluded LIMA graft to LAD. The native LAD was also occluded at the ostium. The native LAD was opened with balloon angioplasty. Patent vein graft to several OM branches. The native RCA is occluded and the vein graft to the RCA is occluded (known from last cath in 2011). EF was severely reduced at 25%. No PCI was performed. Continued medical therapy was recommended. His antiplatelet regimen was changed. ASA and Brlinita were discontinued. Plavix was added as well as Xarelto, given PAF. He was also treated with BB and ARB given LV dysfunction, coreg + cozaar. He was initially scheduled as a TOC appt for 02/28/16 but had to reschedule the appt. He was seen by his PCP on 11/27. F/u BMP was obtained. Scr and K were both stable.   He presents to clinic today for post hospital f/u. He denies any recurrent chest pain. He has chronic weeezing that he has had for years. No significant dyspnea. His main complaint is insomnia. No symptoms of orthopnea or PND, he just can't seem to fall asleep. He reports full medication compliance. BP is midly elevated at 140/63. HR is 102 bpm.   Current Meds  Medication Sig  . clopidogrel (PLAVIX) 75 MG tablet Take 1 tablet (75 mg total) by mouth daily.  [name]. ezetimibe (ZETIA) 10 MG tablet Take 1 tablet (10 mg total) by mouth daily.  . insulin aspart (NOVOLOG) 100 UNIT/ML injection Inject 10 Units into the skin 3 (three) times daily as needed for high blood sugar (BLOOD SUGARS GREATER THAN 200).   . Insulin Glargine (LANTUS) 100 UNIT/ML Solostar Pen  Inject 50 Units into the skin 2 (two) times daily.  . Insulin Pen Needle (B-D UF III MINI PEN NEEDLES) 31G X 5 MM MISC Use to inject insulin as directed.  [name]. losartan (COZAAR) 25 MG tablet Take 1 tablet (25 mg total) by mouth daily.  . metoprolol succinate (TOPROL-XL) 50 MG 24 hr tablet Take 1 tablet (50 mg total) by mouth 2 (two) times daily before a meal.  . nitroGLYCERIN (NITROSTAT) 0.4 MG SL tablet Place 1 tablet (0.4 mg total) under the tongue every 5 (five) minutes as needed for chest pain.  . rivaroxaban (XARELTO) 20 MG TABS tablet Take 1 tablet (20 mg total) by mouth daily at 6 PM.  . [DISCONTINUED] rosuvastatin (CRESTOR) 20 MG tablet Take 1 tablet (20 mg total) by mouth at bedtime.   Allergies  Allergen Reactions  . Statins Other (See Comments)    Reaction:  Muscle pain   . Amlodipine Besylate Other (See Comments)    Reaction:  Muscle pain   . Cephalexin Hives   Past Medical History:  Diagnosis Date  . Atrial flutter (HCC)   . Cataract   . Chronic kidney disease    on Lisinopril to protect kidneys d/t being on Metformin per pt  . Chronic systolic CHF (congestive heart failure) (HCC)   . Coronary artery disease    a. s/p remote CABG 2001 at [facility] b. Cath 03/2010: for med rx.;  c.  Steffanie DunnLexiscan myoview (  1/14):  Large Inferior apical MI, very small area of peri-infarct ischemia toward the inferior apical segment. EF 19%.  Med Rx was continued.    . Diabetes mellitus 1979   takes Metformni daily  and Lantus 50units bid  . DVT (deep venous thrombosis) (HCC) 2011   legs  . H/O hiatal hernia   . H/O medication noncompliance    Due to insurance issues  . History of blood clots    in legs--this was in 2011--has been off of Coumadin 60months;takes Xarelto daily  . Hyperlipidemia   . Hypotension   . Ischemic cardiomyopathy    a. EF 40% 2010. b. Echo (2/14):  mild LVH, EF 30-35%, diff HK, Gr 1 DD, MAC, mild MR, mod LAE, PASP 39  . Joint pain   . Myocardial infarction    total of 8  heart attacks;last one in 2011  . Paroxysmal atrial fibrillation (HCC)   . Peripheral neuropathy (HCC)   . Peripheral vascular disease (HCC)    a. s/p L BKA 2014.  [name] Shortness of breath dyspnea   . Stroke [facility])    Family History  Problem Relation Age of Onset  . Heart disease Mother     before age 47  . Diabetes Mother   . Heart attack Mother   . Stroke Mother   . Heart disease Father   . Colon cancer Neg Hx   . Stomach cancer Neg Hx   . Pancreatic cancer Neg Hx   . Esophageal cancer Neg Hx    Past Surgical History:  Procedure Laterality Date  . ABDOMINAL AORTAGRAM  04-30-12  . ABDOMINAL AORTAGRAM N/A 04/30/2012   Procedure: ABDOMINAL Ronny Flurry;  Surgeon: [name], MD;  Location: [facility] CATH LAB;  Service: Cardiovascular;  Laterality: N/A;  . ADENOIDECTOMY    . AMPUTATION Left 06/03/2012   Procedure: AMPUTATION BELOW KNEE;  Surgeon: [name], MD;  Location: [facility] OR;  Service: Vascular;  Laterality: Left;  . AMPUTATION Right 04/14/2013   Procedure: AMPUTATION BELOW KNEE ;  Surgeon: [name], MD;  Location: [facility] OR;  Service: Vascular;  Laterality: Right;  . APPLICATION OF WOUND VAC Right 04/20/2014   Procedure: APPLICATION OF WOUND VAC;  Surgeon: [name], MD;  Location: [facility] OR;  Service: Vascular;  Laterality: Right;  . CARDIAC CATHETERIZATION  03/19/10  . CARDIAC CATHETERIZATION N/A 02/22/2016   Procedure: Left Heart Cath and Cors/Grafts Angiography;  Surgeon: Corky Crafts, MD;  Location: [facility] INVASIVE CV LAB;  Service: Cardiovascular;  Laterality: N/A;  . CARDIAC CATHETERIZATION N/A 02/22/2016   Procedure: Coronary Stent Intervention;  Surgeon: Corky Crafts, MD;  Location: [facility] INVASIVE CV LAB;  Service: Cardiovascular;  Laterality: N/A;  . COLONOSCOPY WITH PROPOFOL N/A 10/24/2015   Procedure: COLONOSCOPY WITH PROPOFOL;  Surgeon: Rachael Fee, MD;  Location: [facility] ENDOSCOPY;  Service: Endoscopy;  Laterality: N/A;  . CORONARY ANGIOPLASTY  2002   3 stents  .  CORONARY ARTERY BYPASS GRAFT  03/2000   quadruple  . ENDARTERECTOMY FEMORAL Right 03/25/2013   Procedure: ENDARTERECTOMY FEMORAL ;  Surgeon: [name], MD;  Location: [facility] OR;  Service: Vascular;  Laterality: Right;  . EYE SURGERY Right    Catarct  . HERNIA REPAIR     Hiatal Hernia  . I&D EXTREMITY Right 04/14/2013   Procedure: IRRIGATION AND DEBRIDEMENT OF RIGHT  GROIN & PLACEMENT OF VAC DRESSING;  Surgeon: [name], MD;  Location: [facility] OR;  Service: Vascular;  Laterality: Right;  .  I&D EXTREMITY Right 04/20/2014   Procedure: DEBRIDEMENT OF RIGHT BELOW KNEE AMPUTATION STUMP;  Surgeon: [name], MD;  Location: [facility] OR;  Service: Vascular;  Laterality: Right;  . LOWER EXTREMITY ANGIOGRAM Bilateral 04/30/2012   Procedure: LOWER EXTREMITY ANGIOGRAM;  Surgeon: [name], MD;  Location: [facility] CATH LAB;  Service: Cardiovascular;  Laterality: Bilateral;  bilat lower extrem angio  . PATCH ANGIOPLASTY Right 03/25/2013   Procedure: PATCH ANGIOPLASTY USING VASCU-GUARD PERIPHERAL VASCULAR PATCH;  Surgeon: [name], MD;  Location: [facility] OR;  Service: Vascular;  Laterality: Right;  . revasculariztion  2011/2010   x 2   . TONSILLECTOMY    . WOUND DEBRIDEMENT Right 04/20/2014   s/p  rt bka  with application of wound vac   Social History   Social History  . Marital status: Legally Separated    Spouse name: N/A  . Number of children: N/A  . Years of education: N/A   Occupational History  . Not on file.   Social History Main Topics  . Smoking status: Former Smoker    Types: Cigarettes  . Smokeless tobacco: Never Used     Comment: quit smoking 51yrs ago  . Alcohol use 0.0 oz/week     Comment: occasionally  . Drug use: No  . Sexual activity: Not Currently   Other Topics Concern  . Not on file   Social History Narrative  . No narrative on file     Review of Systems: General: negative for chills, fever, night sweats or weight changes.  Cardiovascular: negative for chest pain, dyspnea  on exertion, edema, orthopnea, palpitations, paroxysmal nocturnal dyspnea or shortness of breath Dermatological: negative for rash Respiratory: negative for cough or wheezing Urologic: negative for hematuria Abdominal: negative for nausea, vomiting, diarrhea, bright red blood per rectum, melena, or hematemesis Neurologic: negative for visual changes, syncope, or dizziness All other systems reviewed and are otherwise negative except as noted above.   Physical Exam:  Height 5\' 9"  (1.753 m), weight 277 lb (125.6 kg).  General appearance: alert, cooperative and no distress Neck: no carotid bruit and no JVD Lungs: clear to auscultation bilaterally Heart: regular rate and rhythm, S1, S2 normal, no murmur, click, rub or gallop Extremities: extremities normal, atraumatic, no cyanosis or edema Pulses: 2+ and symmetric Skin: Skin color, texture, turgor normal. No rashes or lesions Neurologic: Grossly normal  EKG not performed   ASSESSMENT AND PLAN:    1. CAD/NSTEMI: Pt with known history of CAD s/p CABG. Admitted with chest pain/NSTEMI. Cardiac cath per Dr. Eldridge Dace with occluded LIMA graft to LAD. The native LAD was also occluded at the ostium. The native LAD was opened with balloon angioplasty. Patent vein graft to several OM branches. The native RCA is occluded and the vein graft to the RCA is occluded (known from last cath in 2011).  No stent was placed. Continue medical therapy with Plavix + Xarelto, BB, statin and ARB. He is stable w/o recurrent CP. However will increase metoprolol given tachycardia and HTN.  2. DM: followed by PCP.  S/p bilateral BKAs.   3. Ischemic cardiomyopathy: Continue Coreg and Cozzar. EF severely reduced. No dyspnea, orthopnea or PND. He will need follow-up echocardiogram in approximately 3 months. May require ICD if LV function does not improve.   4. Atrial fib, paroxysmal: SR but rate is elevated in the low 100s.  Continue BB and Xarelto. Will increase  metoprolol to 75 mg BID.   5. Acute kidney disease-felt to be probable mild  contrast nephropathy. F/u BMP at PCP office 11/27 showed improved SCr from 1.61 >>1.35.   PLAN  F/u with Dr. Antoine Poche in 6-8 weeks   Robbie Lis PA-C 03/22/2016 10:43 AM

## 2016-03-23 ENCOUNTER — Emergency Department (HOSPITAL_COMMUNITY)
Admission: EM | Admit: 2016-03-23 | Discharge: 2016-03-23 | Disposition: A | Discharge status: Home / Self Care | Payer: Commercial Managed Care - HMO | Source: Home / Self Care | Attending: Emergency Medicine | Admitting: Emergency Medicine

## 2016-03-23 ENCOUNTER — Encounter: Payer: Self-pay | Admitting: Family

## 2016-03-23 ENCOUNTER — Ambulatory Visit (INDEPENDENT_AMBULATORY_CARE_PROVIDER_SITE_OTHER): Payer: Commercial Managed Care - HMO | Admitting: Family

## 2016-03-23 ENCOUNTER — Emergency Department (HOSPITAL_COMMUNITY): Payer: Commercial Managed Care - HMO

## 2016-03-23 ENCOUNTER — Encounter (HOSPITAL_COMMUNITY): Payer: Self-pay | Admitting: Emergency Medicine

## 2016-03-23 VITALS — BP 138/90 | HR 109 | Temp 97.7°F | Resp 20 | Ht 62.0 in | Wt 277.0 lb

## 2016-03-23 DIAGNOSIS — Z87891 Personal history of nicotine dependence: Secondary | ICD-10-CM | POA: Diagnosis not present

## 2016-03-23 DIAGNOSIS — W19XXXA Unspecified fall, initial encounter: Secondary | ICD-10-CM

## 2016-03-23 DIAGNOSIS — N189 Chronic kidney disease, unspecified: Secondary | ICD-10-CM | POA: Insufficient documentation

## 2016-03-23 DIAGNOSIS — J209 Acute bronchitis, unspecified: Secondary | ICD-10-CM

## 2016-03-23 DIAGNOSIS — Y929 Unspecified place or not applicable: Secondary | ICD-10-CM

## 2016-03-23 DIAGNOSIS — G4700 Insomnia, unspecified: Secondary | ICD-10-CM | POA: Diagnosis present

## 2016-03-23 DIAGNOSIS — M79604 Pain in right leg: Secondary | ICD-10-CM | POA: Diagnosis not present

## 2016-03-23 DIAGNOSIS — E1122 Type 2 diabetes mellitus with diabetic chronic kidney disease: Secondary | ICD-10-CM

## 2016-03-23 DIAGNOSIS — R2689 Other abnormalities of gait and mobility: Secondary | ICD-10-CM | POA: Diagnosis not present

## 2016-03-23 DIAGNOSIS — R062 Wheezing: Secondary | ICD-10-CM | POA: Diagnosis not present

## 2016-03-23 DIAGNOSIS — M7989 Other specified soft tissue disorders: Secondary | ICD-10-CM | POA: Diagnosis not present

## 2016-03-23 DIAGNOSIS — R531 Weakness: Secondary | ICD-10-CM | POA: Diagnosis not present

## 2016-03-23 DIAGNOSIS — M6281 Muscle weakness (generalized): Secondary | ICD-10-CM | POA: Diagnosis not present

## 2016-03-23 DIAGNOSIS — Z7902 Long term (current) use of antithrombotics/antiplatelets: Secondary | ICD-10-CM | POA: Insufficient documentation

## 2016-03-23 DIAGNOSIS — Z8673 Personal history of transient ischemic attack (TIA), and cerebral infarction without residual deficits: Secondary | ICD-10-CM

## 2016-03-23 DIAGNOSIS — I5043 Acute on chronic combined systolic (congestive) and diastolic (congestive) heart failure: Secondary | ICD-10-CM | POA: Diagnosis present

## 2016-03-23 DIAGNOSIS — M25562 Pain in left knee: Secondary | ICD-10-CM

## 2016-03-23 DIAGNOSIS — F339 Major depressive disorder, recurrent, unspecified: Secondary | ICD-10-CM | POA: Diagnosis not present

## 2016-03-23 DIAGNOSIS — I5040 Unspecified combined systolic (congestive) and diastolic (congestive) heart failure: Secondary | ICD-10-CM | POA: Diagnosis not present

## 2016-03-23 DIAGNOSIS — R030 Elevated blood-pressure reading, without diagnosis of hypertension: Secondary | ICD-10-CM | POA: Diagnosis not present

## 2016-03-23 DIAGNOSIS — I252 Old myocardial infarction: Secondary | ICD-10-CM | POA: Diagnosis not present

## 2016-03-23 DIAGNOSIS — Z89511 Acquired absence of right leg below knee: Secondary | ICD-10-CM | POA: Diagnosis not present

## 2016-03-23 DIAGNOSIS — Y999 Unspecified external cause status: Secondary | ICD-10-CM | POA: Insufficient documentation

## 2016-03-23 DIAGNOSIS — Y939 Activity, unspecified: Secondary | ICD-10-CM

## 2016-03-23 DIAGNOSIS — S50311A Abrasion of right elbow, initial encounter: Secondary | ICD-10-CM | POA: Insufficient documentation

## 2016-03-23 DIAGNOSIS — E785 Hyperlipidemia, unspecified: Secondary | ICD-10-CM | POA: Diagnosis present

## 2016-03-23 DIAGNOSIS — Z794 Long term (current) use of insulin: Secondary | ICD-10-CM

## 2016-03-23 DIAGNOSIS — W050XXA Fall from non-moving wheelchair, initial encounter: Secondary | ICD-10-CM | POA: Diagnosis present

## 2016-03-23 DIAGNOSIS — Z951 Presence of aortocoronary bypass graft: Secondary | ICD-10-CM

## 2016-03-23 DIAGNOSIS — Z955 Presence of coronary angioplasty implant and graft: Secondary | ICD-10-CM | POA: Diagnosis not present

## 2016-03-23 DIAGNOSIS — I130 Hypertensive heart and chronic kidney disease with heart failure and stage 1 through stage 4 chronic kidney disease, or unspecified chronic kidney disease: Secondary | ICD-10-CM | POA: Diagnosis present

## 2016-03-23 DIAGNOSIS — I5021 Acute systolic (congestive) heart failure: Secondary | ICD-10-CM | POA: Diagnosis not present

## 2016-03-23 DIAGNOSIS — R0602 Shortness of breath: Secondary | ICD-10-CM | POA: Diagnosis present

## 2016-03-23 DIAGNOSIS — I110 Hypertensive heart disease with heart failure: Secondary | ICD-10-CM | POA: Diagnosis not present

## 2016-03-23 DIAGNOSIS — I248 Other forms of acute ischemic heart disease: Secondary | ICD-10-CM | POA: Diagnosis present

## 2016-03-23 DIAGNOSIS — I5022 Chronic systolic (congestive) heart failure: Secondary | ICD-10-CM

## 2016-03-23 DIAGNOSIS — E1151 Type 2 diabetes mellitus with diabetic peripheral angiopathy without gangrene: Secondary | ICD-10-CM

## 2016-03-23 DIAGNOSIS — J810 Acute pulmonary edema: Secondary | ICD-10-CM | POA: Diagnosis not present

## 2016-03-23 DIAGNOSIS — M25561 Pain in right knee: Secondary | ICD-10-CM | POA: Insufficient documentation

## 2016-03-23 DIAGNOSIS — R279 Unspecified lack of coordination: Secondary | ICD-10-CM | POA: Diagnosis not present

## 2016-03-23 DIAGNOSIS — I25709 Atherosclerosis of coronary artery bypass graft(s), unspecified, with unspecified angina pectoris: Secondary | ICD-10-CM | POA: Diagnosis not present

## 2016-03-23 DIAGNOSIS — J9601 Acute respiratory failure with hypoxia: Secondary | ICD-10-CM | POA: Diagnosis present

## 2016-03-23 DIAGNOSIS — Z79899 Other long term (current) drug therapy: Secondary | ICD-10-CM

## 2016-03-23 DIAGNOSIS — Z8249 Family history of ischemic heart disease and other diseases of the circulatory system: Secondary | ICD-10-CM | POA: Diagnosis not present

## 2016-03-23 DIAGNOSIS — R259 Unspecified abnormal involuntary movements: Secondary | ICD-10-CM | POA: Diagnosis not present

## 2016-03-23 DIAGNOSIS — I6789 Other cerebrovascular disease: Secondary | ICD-10-CM | POA: Diagnosis not present

## 2016-03-23 DIAGNOSIS — S60512A Abrasion of left hand, initial encounter: Secondary | ICD-10-CM

## 2016-03-23 DIAGNOSIS — Z6841 Body Mass Index (BMI) 40.0 and over, adult: Secondary | ICD-10-CM | POA: Diagnosis not present

## 2016-03-23 DIAGNOSIS — I2510 Atherosclerotic heart disease of native coronary artery without angina pectoris: Secondary | ICD-10-CM | POA: Diagnosis present

## 2016-03-23 DIAGNOSIS — N179 Acute kidney failure, unspecified: Secondary | ICD-10-CM | POA: Diagnosis present

## 2016-03-23 DIAGNOSIS — E1142 Type 2 diabetes mellitus with diabetic polyneuropathy: Secondary | ICD-10-CM | POA: Insufficient documentation

## 2016-03-23 DIAGNOSIS — Z89512 Acquired absence of left leg below knee: Secondary | ICD-10-CM | POA: Diagnosis not present

## 2016-03-23 DIAGNOSIS — Z823 Family history of stroke: Secondary | ICD-10-CM | POA: Diagnosis not present

## 2016-03-23 DIAGNOSIS — I480 Paroxysmal atrial fibrillation: Secondary | ICD-10-CM | POA: Diagnosis present

## 2016-03-23 DIAGNOSIS — I4891 Unspecified atrial fibrillation: Secondary | ICD-10-CM | POA: Diagnosis not present

## 2016-03-23 DIAGNOSIS — Z833 Family history of diabetes mellitus: Secondary | ICD-10-CM | POA: Diagnosis not present

## 2016-03-23 DIAGNOSIS — H409 Unspecified glaucoma: Secondary | ICD-10-CM | POA: Diagnosis not present

## 2016-03-23 DIAGNOSIS — Z86718 Personal history of other venous thrombosis and embolism: Secondary | ICD-10-CM | POA: Diagnosis not present

## 2016-03-23 DIAGNOSIS — R031 Nonspecific low blood-pressure reading: Secondary | ICD-10-CM | POA: Diagnosis not present

## 2016-03-23 DIAGNOSIS — R2240 Localized swelling, mass and lump, unspecified lower limb: Secondary | ICD-10-CM | POA: Diagnosis not present

## 2016-03-23 MED ORDER — AZITHROMYCIN 250 MG PO TABS: ORAL_TABLET | ORAL | 0 refills | Status: DC

## 2016-03-23 MED ORDER — PRODIGY AUTOCODE BLOOD GLUCOSE W/DEVICE KIT: PACK | 0 refills | Status: DC

## 2016-03-23 MED ORDER — ALBUTEROL SULFATE HFA 108 (90 BASE) MCG/ACT IN AERS: 2.0000 | INHALATION_SPRAY | Freq: Four times a day (QID) | RESPIRATORY_TRACT | 0 refills | Status: AC | PRN

## 2016-03-23 MED ORDER — BENZONATATE 100 MG PO CAPS: 100.0000 mg | ORAL_CAPSULE | Freq: Three times a day (TID) | ORAL | 0 refills | Status: DC | PRN

## 2016-03-23 MED ORDER — GLUCOSE BLOOD VI STRP: ORAL_STRIP | 1 refills | Status: DC

## 2016-03-23 MED ORDER — ALBUTEROL SULFATE (2.5 MG/3ML) 0.083% IN NEBU
2.5000 mg | INHALATION_SOLUTION | Freq: Once | RESPIRATORY_TRACT | Status: AC
  Administered 2016-03-23: 2.5 mg via RESPIRATORY_TRACT

## 2016-03-23 NOTE — ED Provider Notes (Signed)
[place] DEPT Provider Note   CSN: [id number] Arrival date & time: 03/23/16  1414  By signing my name below, I, Ephriam Jenkins, attest that this documentation has been prepared under the direction and in the presence of Jeffery Deiondra Denley PA-C.  Electronically Signed: Ephriam Jenkins, ED Scribe. 03/23/16. 2:53 PM.   History   Chief Complaint Chief Complaint  Patient presents with  . Fall    fell from wheelchair  . Knee Pain  . Shortness of Breath    pt report chronic shortness of breath    HPI HPI Comments: Leroy [name] is a 70 y.o. male, with bilateral BKA and Hx of CKD, CHF, MI, who presents to the Emergency Department s/p an injury that occurred just prior to arrival. Pt states that he was coming down a ramp on his motorized wheelchair and the wheelchair tipped forward. Pt fell forward and struck both of his hands and knees on the concrete. He reports worst pain to bilateral knees and has noted superficial abrasions on both knees and hands. Pt states that has felt short of breath for the past 4 or 5 weeks and was on the way back from his PCP's office; he states that it is a side effect of a medication. Denies any LOC.   The history is provided by the patient. No language interpreter was used.    Past Medical History:  Diagnosis Date  . Atrial flutter ([place])   . Cataract   . Chronic kidney disease    on Lisinopril to protect kidneys d/t being on Metformin per pt  . Chronic systolic CHF (congestive heart failure) ([place])   . Coronary artery disease    a. s/p remote CABG 2001 at [facility] b. Cath 03/2010: for med rx.;  c.  Carlton Adam myoview (1/14):  Large Inferior apical MI, very small area of peri-infarct ischemia toward the inferior apical segment. EF 19%.  Med Rx was continued.    . Diabetes mellitus 1979   takes Metformni daily  and Lantus 50units bid  . DVT (deep venous thrombosis) ([place]) 2011   legs  . H/O hiatal hernia   . H/O medication noncompliance    Due to insurance issues   . History of blood clots    in legs--this was in 2011--has been off of Coumadin 20month;takes Xarelto daily  . Hyperlipidemia   . Hypotension   . Ischemic cardiomyopathy    a. EF 40% 2010. b. Echo (2/14):  mild LVH, EF 30-35%, diff HK, Gr 1 DD, MAC, mild MR, mod LAE, PASP 39  . Joint pain   . Myocardial infarction    total of 8 heart attacks;last one in 2011  . Paroxysmal atrial fibrillation (HCC)   . Peripheral neuropathy ([place]   . Peripheral vascular disease ([place]    a. s/p L BKA 2014.  .Marland KitchenShortness of breath dyspnea   . Stroke ([facility]     Patient Active Problem List   Diagnosis Date Noted  . Noncompliance 02/23/2016  . SOB (shortness of breath)   . NSTEMI (non-ST elevated myocardial infarction) ([place] 02/22/2016  . Chest pain, atypical 02/22/2016  . Essential hypertension 02/22/2016  . Ischemic colitis ([place] 10/25/2015  . Pain, abdominal, RLQ 10/23/2015  . Hemispheric carotid artery syndrome   . Coronary artery disease involving coronary bypass graft of native heart with angina pectoris ([place]   . PVD (peripheral vascular disease) ([place]   . 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 Diabetes mellitus type 2 with peripheral artery disease ([place]  05/22/2015  . Stroke due to embolism ([place])   . Microalbuminuria 09/29/2014  . 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 Phantom limb pain ([place]) 08/16/2013  . Pain in limb- Right stump 07/06/2013  . Atherosclerosis of native arteries of the extremities with ulceration ([place]) 06/25/2013  . S/P bilateral BKA (below knee amputation) ([place]) 04/21/2013  . Chronic systolic heart failure ([place]) 04/15/2013  . 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 S/P BKA (below knee amputation) bilateral ([place]) 09/10/2012  . SVT (supraventricular tachycardia) ([place]) 09/02/2012  . Hyperlipidemia with target LDL less than 70 01/02/2012  . ATRIAL FIBRILLATION, PAROXYSMAL 04/13/2010    Past Surgical History:  Procedure Laterality Date  .  ABDOMINAL AORTAGRAM  04-30-12  . ABDOMINAL AORTAGRAM N/A 04/30/2012   Procedure: ABDOMINAL Maxcine Ham;  Surgeon: Conrad Gorham, MD;  [facility] LAB;  Service: Cardiovascular;  Laterality: N/A;  . ADENOIDECTOMY    . AMPUTATION Left 06/03/2012   Procedure: AMPUTATION BELOW KNEE;  Surgeon: Conrad Pine River, MD;  Location: [place];  Service: Vascular;  Laterality: Left;  . AMPUTATION Right 04/14/2013   Procedure: AMPUTATION BELOW KNEE ;  Surgeon: Conrad Winona, MD;  Location: [place];  Service: Vascular;  Laterality: Right;  . APPLICATION OF WOUND VAC Right 04/20/2014   Procedure: APPLICATION OF WOUND VAC;  Surgeon: Conrad Roxie, MD;  Location: [place];  Service: Vascular;  Laterality: Right;  . CARDIAC CATHETERIZATION  03/19/10  . CARDIAC CATHETERIZATION N/A 02/22/2016   Procedure: Left Heart Cath and Cors/Grafts Angiography;  Surgeon: Jettie Booze, MD;  Location: [place] CV LAB;  Service: Cardiovascular;  Laterality: N/A;  . CARDIAC CATHETERIZATION N/A 02/22/2016   Procedure: Coronary Stent Intervention;  Surgeon: Jettie Booze, MD;  Location: [place] CV LAB;  Service: Cardiovascular;  Laterality: N/A;  . CARDIAC CATHETERIZATION    . COLONOSCOPY WITH PROPOFOL N/A 10/24/2015   Procedure: COLONOSCOPY WITH PROPOFOL;  Surgeon: Milus Banister, MD;  Location: [facility] ENDOSCOPY;  Service: Endoscopy;  Laterality: N/A;  . CORONARY ANGIOPLASTY  2002   3 stents  . CORONARY ARTERY BYPASS GRAFT  03/2000   quadruple  . ENDARTERECTOMY FEMORAL Right 03/25/2013   Procedure: ENDARTERECTOMY FEMORAL ;  Surgeon: Conrad Gerald, MD;  Location: [place];  Service: Vascular;  Laterality: Right;  . EYE SURGERY Right    Catarct  . HERNIA REPAIR     Hiatal Hernia  . I&D EXTREMITY Right 04/14/2013   Procedure: IRRIGATION AND DEBRIDEMENT OF RIGHT  GROIN & PLACEMENT OF VAC DRESSING;  Surgeon: Conrad Holmes, MD;  Location: [place];  Service: Vascular;  Laterality: Right;  . I&D EXTREMITY Right 04/20/2014   Procedure:  DEBRIDEMENT OF RIGHT BELOW KNEE AMPUTATION STUMP;  Surgeon: Conrad Whitehouse, MD;  Location: [place];  Service: Vascular;  Laterality: Right;  . LOWER EXTREMITY ANGIOGRAM Bilateral 04/30/2012   Procedure: LOWER EXTREMITY ANGIOGRAM;  Surgeon: Conrad Pierre, MD;  Location: [facility] CATH LAB;  Service: Cardiovascular;  Laterality: Bilateral;  bilat lower extrem angio  . PATCH ANGIOPLASTY Right 03/25/2013   Procedure: PATCH ANGIOPLASTY USING VASCU-GUARD PERIPHERAL VASCULAR PATCH;  Surgeon: Conrad Clarks Grove, MD;  Location: [place];  Service: Vascular;  Laterality: Right;  . revasculariztion  2011/2010   x 2   . TONSILLECTOMY    . WOUND DEBRIDEMENT Right 04/20/2014   s/p  rt bka  with application of wound vac       Home Medications    Prior to Admission  medications   Medication Sig Start Date End Date Taking? Authorizing Provider  albuterol (PROVENTIL HFA;VENTOLIN HFA) 108 (90 Base) MCG/ACT inhaler Inhale 2 puffs into the lungs every 6 (six) hours as needed for wheezing or shortness of breath. 03/23/16   Debbrah Alar, NP  azithromycin (ZITHROMAX Z-PAK) 250 MG tablet 2 tabs by mouth today, then one tab once daily for 4 more days 03/23/16   Debbrah Alar, NP  benzonatate (TESSALON) 100 MG capsule Take 1 capsule (100 mg total) by mouth 3 (three) times daily as needed. 03/23/16   Debbrah Alar, NP  Blood Glucose Monitoring Suppl (PRODIGY AUTOCODE BLOOD GLUCOSE) w/Device KIT 1 kit 03/23/16   Debbrah Alar, NP  clopidogrel (PLAVIX) 75 MG tablet Take 1 tablet (75 mg total) by mouth daily. 02/25/16   Clanford Marisa Hua, MD  ezetimibe (ZETIA) 10 MG tablet Take 1 tablet (10 mg total) by mouth daily. 05/07/13   Lavon Paganini Angiulli, PA-C  glucose blood test strip Test 3 times daily. 03/23/16   Debbrah Alar, NP  insulin aspart (NOVOLOG) 100 UNIT/ML injection Inject 10 Units into the skin 3 (three) times daily as needed for high blood sugar (BLOOD SUGARS GREATER THAN 200).     Historical Provider, MD    Insulin Glargine (LANTUS) 100 UNIT/ML Solostar Pen Inject 50 Units into the skin 2 (two) times daily. 01/05/16   Janith Lima, MD  Insulin Pen Needle (B-D UF III MINI PEN NEEDLES) 31G X 5 MM MISC Use to inject insulin as directed. 11/27/15   Janith Lima, MD  losartan (COZAAR) 25 MG tablet Take 1 tablet (25 mg total) by mouth daily. 02/26/16   Clanford Marisa Hua, MD  metoprolol succinate (TOPROL-XL) 25 MG 24 hr tablet Take 1 tablet (25 mg total) by mouth 2 (two) times daily. Take with 50 mg dose to equal (75 mg) twice daily 03/22/16   Brittainy M Simmons, PA-C  metoprolol succinate (TOPROL-XL) 50 MG 24 hr tablet Take 1 tablet (50 mg total) by mouth 2 (two) times daily. Take with or immediately following a meal. 03/22/16 06/20/16  Brittainy Erie Noe, PA-C  nitroGLYCERIN (NITROSTAT) 0.4 MG SL tablet Place 1 tablet (0.4 mg total) under the tongue every 5 (five) minutes as needed for chest pain. 05/07/13   Lavon Paganini Angiulli, PA-C  rivaroxaban (XARELTO) 20 MG TABS tablet Take 1 tablet (20 mg total) by mouth daily at 6 PM. 02/25/16   Clanford Marisa Hua, MD    Family History Family History  Problem Relation Age of Onset  . Heart disease Mother     before age 39  . Diabetes Mother   . Heart attack Mother   . Stroke Mother   . Heart disease Father   . Colon cancer Neg Hx   . Stomach cancer Neg Hx   . Pancreatic cancer Neg Hx   . Esophageal cancer Neg Hx     Social History Social History  Substance Use Topics  . Smoking status: Former Smoker    Types: Cigarettes  . Smokeless tobacco: Never Used     Comment: quit smoking 4yr ago  . Alcohol use No     Allergies   Statins; Amlodipine besylate; and Cephalexin   Review of Systems Review of Systems A complete 10 system review of systems was obtained and all systems are negative except as noted in the HPI and PMH.    Physical Exam Updated Vital Signs BP (!) 129/109 (BP Location: Left Arm)   Pulse 105  Temp 97.9 F (36.6 C)  (Oral)   Resp 22   SpO2 100%   Physical Exam  Constitutional: He is oriented to person, place, and time. He appears well-developed and well-nourished. No distress.  HENT:  Head: Normocephalic and atraumatic.  Neck: Normal range of motion.  Pulmonary/Chest: Effort normal.  Musculoskeletal: Normal range of motion. He exhibits tenderness.  TTP of bilateral anterior knees.  Neurological: He is alert and oriented to person, place, and time.  Skin: Skin is warm and dry. He is not diaphoretic.  Superficial abrasions to right elbow. Full ROM Superficial abrasion to left hand. Full ROM Abrasion to medial aspect of left knee.  Psychiatric: He has a normal mood and affect. Judgment normal.  Nursing note and vitals reviewed.    ED Treatments / Results  DIAGNOSTIC STUDIES: Oxygen Saturation is 95% on RA, adequate by my interpretation.  COORDINATION OF CARE: 2:52 PM-Discussed treatment plan with pt at bedside and pt agreed to plan.   Labs (all labs ordered are listed, but only abnormal results are displayed) Labs Reviewed - No data to display  EKG  EKG Interpretation None       Radiology Dg Knee Complete 4 Views Left  Result Date: 03/23/2016 CLINICAL DATA:  Fall out of wheelchair today. Left knee pain. Initial encounter. EXAM: LEFT KNEE - COMPLETE 4+ VIEW COMPARISON:  None. FINDINGS: No evidence of fracture, dislocation, or joint effusion. No evidence of arthropathy or other focal bone abnormality. Previous below-the-knee amputation, without evidence of osteolysis or periostitis. Prepatellar and infrapatellar soft tissue swelling noted. Peripheral vascular calcification also demonstrated. IMPRESSION: Prepatellar and infrapatellar soft tissue swelling. No evidence of acute fracture or dislocation. Below-the-knee amputation and peripheral vascular disease. Electronically Signed   By: Earle Gell M.D.   On: 03/23/2016 16:05   Dg Knee Complete 4 Views Right  Result Date:  03/23/2016 CLINICAL DATA:  [place] out of wheelchair today. Right knee pain. Initial encounter. EXAM: RIGHT KNEE - COMPLETE 4+ VIEW COMPARISON:  02/22/2014 FINDINGS: No evidence of fracture, dislocation, or joint effusion. Moderate tricompartmental osteoarthritis. Previous below-the-knee amputation is stable in appearance. No evidence of osteolysis or periostitis. Prepatellar soft tissue swelling noted. Peripheral vascular calcification also demonstrated. IMPRESSION: Prepatellar soft tissue swelling. No evidence of fracture or dislocation. Moderate tricompartmental osteoarthritis. Stable appearance of below-the-knee amputation. Electronically Signed   By: Earle Gell M.D.   On: 03/23/2016 16:03    Procedures Procedures (including critical care time)  Medications Ordered in ED Medications - No data to display   Initial Impression / Assessment and Plan / ED Course  I have reviewed the triage vital signs and the nursing notes.  Pertinent labs & imaging results that were available during my care of the patient were reviewed by me and considered in my medical decision making (see chart for details).  Clinical Course     70 year old male presents status post mechanical fall. He has superficial abrasions, no signs of fracture on x-ray. Patient currently being followed by primary care for chronic shortness of breath, no change in baseline, he appears to be no acute distress. Patient discharged home with primary care follow-up, strict return precautions. He verbalized understanding and agreement to today's plan and had no further questions or concerns at time of discharge  Final Clinical Impressions(s) / ED Diagnoses   Final diagnoses:  Fall, initial encounter  Acute pain of both knees    New Prescriptions Discharge Medication List as of 03/23/2016  4:12 PM    I personally  performed the services described in this documentation, which was scribed in my presence. The recorded information has been  reviewed and is accurate.   Okey Regal, PA-C 03/23/16 [id number]    Malvin Johns, MD 03/24/16 [phone]

## 2016-03-23 NOTE — ED Triage Notes (Signed)
[facility] here to transport pt home. Daughter at bedside. Pt stated that he knows were his motorized chair is located.  Pt was able to transfer to stretcher withl minimal assist

## 2016-03-23 NOTE — ED Triage Notes (Signed)
Per EMS- pt fell  from motorized wheelchair, while going down ramp from public bus. Denies LOC. C/o bilateral  knee and r/elbow pain. Pt is a bilateral BKA amputee

## 2016-03-23 NOTE — ED Triage Notes (Signed)
Transferred to hospital transport chair with assist. Escorted to lobby , near registrar to wait for transportation. Offer for a soda declined

## 2016-03-23 NOTE — ED Triage Notes (Signed)
PA at bedside. Pt reports chronic shortness of breath and was seen by PCP today, was given rx for inhaler

## 2016-03-23 NOTE — ED Notes (Signed)
Bed: WTR5 Expected date:  Expected time:  Means of arrival:  Comments: 70 yo fall from wheelchair, leg pain

## 2016-03-23 NOTE — ED Triage Notes (Signed)
Waiting for [facility] for transport.  

## 2016-03-23 NOTE — Progress Notes (Signed)
Pre visit review using our clinic review tool, if applicable. No additional management support is needed unless otherwise documented below in the visit note. 

## 2016-03-23 NOTE — Discharge Instructions (Signed)
Please read attached information. If you experience any new or worsening signs or symptoms please return to the emergency room for evaluation. Please follow-up with your primary care provider or specialist as discussed.  °

## 2016-03-23 NOTE — Progress Notes (Signed)
Subjective:    Patient ID: Leroy [name], male    DOB: 26-Aug-1945, 70 y.o.   MRN: [id number]  HPI  Leroy [name] is  70 yr old male who presents today with chief complaint of cough.  Reports "head and chest cold." Reports symptoms have been present for 3-4 weeks. He denies associated fever.  He does report + wheezing in his chest.  Denies ear pain, sore throat. He reports + sinus pressure.  Has been bringing up sputum.   Reports appetite is low. Has a sugar of 53 yesterday.    Review of Systems See HPI  Past Medical History:  Diagnosis Date  . Atrial flutter (HCC)   . Cataract   . Chronic kidney disease    on Lisinopril to protect kidneys d/t being on Metformin per pt  . Chronic systolic CHF (congestive heart failure) (HCC)   . Coronary artery disease    a. s/p remote CABG 2001 at [facility] b. Cath 03/2010: for med rx.;  c.  Eugenie BirksLexiscan myoview (1/14):  Large Inferior apical MI, very small area of peri-infarct ischemia toward the inferior apical segment. EF 19%.  Med Rx was continued.    . Diabetes mellitus 1979   takes Metformni daily  and Lantus 50units bid  . DVT (deep venous thrombosis) (HCC) 2011   legs  . H/O hiatal hernia   . H/O medication noncompliance    Due to insurance issues  . History of blood clots    in legs--this was in 2011--has been off of Coumadin 433months;takes Xarelto daily  . Hyperlipidemia   . Hypotension   . Ischemic cardiomyopathy    a. EF 40% 2010. b. Echo (2/14):  mild LVH, EF 30-35%, diff HK, Gr 1 DD, MAC, mild MR, mod LAE, PASP 39  . Joint pain   . Myocardial infarction    total of 8 heart attacks;last one in 2011  . Paroxysmal atrial fibrillation (HCC)   . Peripheral neuropathy (HCC)   . Peripheral vascular disease (H[facility])    a. s/p L BKA 2014.  [name]. Shortness of breath dyspnea   . Stroke [facility](HCC)      Social History   Social History  . Marital status: Legally Separated    Spouse name: N/A  . Number of children: N/A  . Years of education: N/A    Occupational History  . Not on file.   Social History Main Topics  . Smoking status: Former Smoker    Types: Cigarettes  . Smokeless tobacco: Never Used     Comment: quit smoking 3223yrs ago  . Alcohol use 0.0 oz/week     Comment: occasionally  . Drug use: No  . Sexual activity: Not Currently   Other Topics Concern  . Not on file   Social History Narrative  . No narrative on file    Past Surgical History:  Procedure Laterality Date  . ABDOMINAL AORTAGRAM  04-30-12  . ABDOMINAL AORTAGRAM N/A 04/30/2012   Procedure: ABDOMINAL Ronny FlurryAORTAGRAM;  Surgeon: Fransisco HertzBrian L Chen, MD;  [facility];  Service: Cardiovascular;  Laterality: N/A;  . ADENOIDECTOMY    . AMPUTATION Left 06/03/2012   Procedure: AMPUTATION BELOW KNEE;  Surgeon: Fransisco HertzBrian L Chen, MD;  [facility];  Service: Vascular;  Laterality: Left;  . AMPUTATION Right 04/14/2013   Procedure: AMPUTATION BELOW KNEE ;  Surgeon: Fransisco HertzBrian L Chen, MD;  [facility];  Service: Vascular;  Laterality: Right;  . APPLICATION OF WOUND VAC Right 04/20/2014  Procedure: APPLICATION OF WOUND VAC;  Surgeon: [name], MD;  Location: [facility] OR;  Service: Vascular;  Laterality: Right;  . CARDIAC CATHETERIZATION  03/19/10  . CARDIAC CATHETERIZATION N/A 02/22/2016   Procedure: Left Heart Cath and Cors/Grafts Angiography;  Surgeon: Corky Crafts, MD;  Location: [facility] INVASIVE CV LAB;  Service: Cardiovascular;  Laterality: N/A;  . CARDIAC CATHETERIZATION N/A 02/22/2016   Procedure: Coronary Stent Intervention;  Surgeon: Corky Crafts, MD;  Location: [facility] INVASIVE CV LAB;  Service: Cardiovascular;  Laterality: N/A;  . COLONOSCOPY WITH PROPOFOL N/A 10/24/2015   Procedure: COLONOSCOPY WITH PROPOFOL;  Surgeon: Rachael Fee, MD;  [facility] ENDOSCOPY;  Service: Endoscopy;  Laterality: N/A;  . CORONARY ANGIOPLASTY  2002   3 stents  . CORONARY ARTERY BYPASS GRAFT  03/2000   quadruple  . ENDARTERECTOMY FEMORAL Right 03/25/2013   Procedure:  ENDARTERECTOMY FEMORAL ;  Surgeon: [name], MD;  [facility] OR;  Service: Vascular;  Laterality: Right;  . EYE SURGERY Right    Catarct  . HERNIA REPAIR     Hiatal Hernia  . I&D EXTREMITY Right 04/14/2013   Procedure: IRRIGATION AND DEBRIDEMENT OF RIGHT  GROIN & PLACEMENT OF VAC DRESSING;  Surgeon: [name], MD;  Location: [facility] OR;  Service: Vascular;  Laterality: Right;  . I&D EXTREMITY Right 04/20/2014   Procedure: DEBRIDEMENT OF RIGHT BELOW KNEE AMPUTATION STUMP;  Surgeon: [name], MD;  Location: [facility] OR;  Service: Vascular;  Laterality: Right;  . LOWER EXTREMITY ANGIOGRAM Bilateral 04/30/2012   Procedure: LOWER EXTREMITY ANGIOGRAM;  Surgeon: [name], MD;  [facility] CATH LAB;  Service: Cardiovascular;  Laterality: Bilateral;  bilat lower extrem angio  . PATCH ANGIOPLASTY Right 03/25/2013   Procedure: PATCH ANGIOPLASTY USING VASCU-GUARD PERIPHERAL VASCULAR PATCH;  Surgeon: [name], MD;  Location: [facility] OR;  Service: Vascular;  Laterality: Right;  . revasculariztion  2011/2010   x 2   . TONSILLECTOMY    . WOUND DEBRIDEMENT Right 04/20/2014   s/p  rt bka  with application of wound vac    Family History  Problem Relation Age of Onset  . Heart disease Mother     before age 46  . Diabetes Mother   . Heart attack Mother   . Stroke Mother   . Heart disease Father   . Colon cancer Neg Hx   . Stomach cancer Neg Hx   . Pancreatic cancer Neg Hx   . Esophageal cancer Neg Hx     Allergies  Allergen Reactions  . Statins Other (See Comments)    Reaction:  Muscle pain   . Amlodipine Besylate Other (See Comments)    Reaction:  Muscle pain   . Cephalexin Hives    Current Outpatient Prescriptions on File Prior to Visit  Medication Sig Dispense Refill  . clopidogrel (PLAVIX) 75 MG tablet Take 1 tablet (75 mg total) by mouth daily. 30 tablet 0  . ezetimibe (ZETIA) 10 MG tablet Take 1 tablet (10 mg total) by mouth daily. 30 tablet 1  . insulin aspart (NOVOLOG) 100 UNIT/ML  injection Inject 10 Units into the skin 3 (three) times daily as needed for high blood sugar (BLOOD SUGARS GREATER THAN 200).     . Insulin Glargine (LANTUS) 100 UNIT/ML Solostar Pen Inject 50 Units into the skin 2 (two) times daily. 15 mL 3  . Insulin Pen Needle (B-D UF III MINI PEN NEEDLES) 31G X 5 MM MISC Use to inject insulin as  directed. 270 each 3  . losartan (COZAAR) 25 MG tablet Take 1 tablet (25 mg total) by mouth daily. 30 tablet 0  . metoprolol succinate (TOPROL-XL) 25 MG 24 hr tablet Take 1 tablet (25 mg total) by mouth 2 (two) times daily. Take with 50 mg dose to equal (75 mg) twice daily 60 tablet 9  . metoprolol succinate (TOPROL-XL) 50 MG 24 hr tablet Take 1 tablet (50 mg total) by mouth 2 (two) times daily. Take with or immediately following a meal. 60 tablet 9  . nitroGLYCERIN (NITROSTAT) 0.4 MG SL tablet Place 1 tablet (0.4 mg total) under the tongue every 5 (five) minutes as needed for chest pain. 30 tablet 12  . rivaroxaban (XARELTO) 20 MG TABS tablet Take 1 tablet (20 mg total) by mouth daily at 6 PM. 30 tablet 0   No current facility-administered medications on file prior to visit.     BP 138/90 (BP Location: Left Arm, Patient Position: Sitting, Cuff Size: Large)   Pulse (!) 109   Temp 97.7 F (36.5 C) (Oral)   Resp 20   Ht 5\' 2"  (1.575 m)   Wt 277 lb (125.6 kg)   SpO2 94%   BMI 50.66 kg/m       Objective:   Physical Exam  Constitutional: He is oriented to person, place, and time.  Chronically ill appearing male seated in motorized cart, NAD  HENT:  Head: Normocephalic and atraumatic.  Mouth/Throat: No oropharyngeal exudate, posterior oropharyngeal edema or posterior oropharyngeal erythema.  Bilateral TM's occluded by cerumen  Cardiovascular: Normal rate and regular rhythm.   No murmur heard. Pulmonary/Chest: Effort normal. No respiratory distress. He has no rales.  Soft expiratory wheeze  Musculoskeletal:  Bilateral BKA, bilateral LE wrapped in ACE  bandages Bilateral clubbing of fingernails noted.   Neurological: He is alert and oriented to person, place, and time.  Skin: Skin is warm and dry.          Assessment & Plan:  Bronchitis- patient was given albuterol neb here in the office today and noted improvement in his breathing following treatment. Will rx with zpak, tessalon prn cough, albuterol prn wheezing.  Call if symptoms worsen, if you develop fever or if symptoms are not improved in 2-3 days.   Hypoglycemia- likely due to recent decreased PO intake. Advised pt to cut his lantus down from 50 units bid to 48 units bid.  Call if further sugars <80.  Advised pt when his appetite returns to normal he may increase his lantus back up to 50 units/bid.

## 2016-03-23 NOTE — Patient Instructions (Addendum)
Begin zpak for bronchitis.   Begin tessalon as needed for cough and albuterol 2 puffs as needed for wheezing. Call if symptoms worsen, if you develop fever or if symptoms are not improved in 2-3 days.  Decrease your lantus to 48 units twice daily until your appetite returns to normal, then you can increase back to 50 units once daily. Let us know if you have recurrent blood sugars <80.

## 2016-03-23 NOTE — ED Triage Notes (Signed)
Pt stated he was coming down ramp on his motorized wheelchair and the chair tipped forward. Pt stated that he struck his r/elbow and both knees. Wheelchair landed on top of him. Pt remains alerty, oriented and appropriate. Denies LOC

## 2016-03-25 ENCOUNTER — Emergency Department (HOSPITAL_COMMUNITY): Payer: Commercial Managed Care - HMO

## 2016-03-25 ENCOUNTER — Encounter (HOSPITAL_COMMUNITY): Payer: Self-pay

## 2016-03-25 ENCOUNTER — Inpatient Hospital Stay (HOSPITAL_COMMUNITY)
Admission: EM | Admit: 2016-03-25 | Discharge: 2016-04-02 | DRG: 291 | Disposition: A | Discharge status: Skilled Nursing Facility | Payer: Commercial Managed Care - HMO | Attending: Internal Medicine | Admitting: Internal Medicine

## 2016-03-25 DIAGNOSIS — I25810 Atherosclerosis of coronary artery bypass graft(s) without angina pectoris: Secondary | ICD-10-CM | POA: Diagnosis present

## 2016-03-25 DIAGNOSIS — J209 Acute bronchitis, unspecified: Secondary | ICD-10-CM | POA: Diagnosis present

## 2016-03-25 DIAGNOSIS — Z89512 Acquired absence of left leg below knee: Secondary | ICD-10-CM

## 2016-03-25 DIAGNOSIS — Z7902 Long term (current) use of antithrombotics/antiplatelets: Secondary | ICD-10-CM

## 2016-03-25 DIAGNOSIS — Z8673 Personal history of transient ischemic attack (TIA), and cerebral infarction without residual deficits: Secondary | ICD-10-CM

## 2016-03-25 DIAGNOSIS — R0602 Shortness of breath: Secondary | ICD-10-CM | POA: Diagnosis present

## 2016-03-25 DIAGNOSIS — I252 Old myocardial infarction: Secondary | ICD-10-CM

## 2016-03-25 DIAGNOSIS — Z823 Family history of stroke: Secondary | ICD-10-CM | POA: Diagnosis not present

## 2016-03-25 DIAGNOSIS — I639 Cerebral infarction, unspecified: Secondary | ICD-10-CM | POA: Diagnosis present

## 2016-03-25 DIAGNOSIS — Z87891 Personal history of nicotine dependence: Secondary | ICD-10-CM

## 2016-03-25 DIAGNOSIS — Z794 Long term (current) use of insulin: Secondary | ICD-10-CM

## 2016-03-25 DIAGNOSIS — E1151 Type 2 diabetes mellitus with diabetic peripheral angiopathy without gangrene: Secondary | ICD-10-CM | POA: Diagnosis present

## 2016-03-25 DIAGNOSIS — I5022 Chronic systolic (congestive) heart failure: Secondary | ICD-10-CM

## 2016-03-25 DIAGNOSIS — Z8249 Family history of ischemic heart disease and other diseases of the circulatory system: Secondary | ICD-10-CM | POA: Diagnosis not present

## 2016-03-25 DIAGNOSIS — I255 Ischemic cardiomyopathy: Secondary | ICD-10-CM | POA: Diagnosis present

## 2016-03-25 DIAGNOSIS — W050XXA Fall from non-moving wheelchair, initial encounter: Secondary | ICD-10-CM | POA: Diagnosis present

## 2016-03-25 DIAGNOSIS — I5043 Acute on chronic combined systolic (congestive) and diastolic (congestive) heart failure: Secondary | ICD-10-CM | POA: Diagnosis present

## 2016-03-25 DIAGNOSIS — Z951 Presence of aortocoronary bypass graft: Secondary | ICD-10-CM

## 2016-03-25 DIAGNOSIS — I130 Hypertensive heart and chronic kidney disease with heart failure and stage 1 through stage 4 chronic kidney disease, or unspecified chronic kidney disease: Secondary | ICD-10-CM | POA: Diagnosis present

## 2016-03-25 DIAGNOSIS — Z955 Presence of coronary angioplasty implant and graft: Secondary | ICD-10-CM | POA: Diagnosis not present

## 2016-03-25 DIAGNOSIS — I248 Other forms of acute ischemic heart disease: Secondary | ICD-10-CM | POA: Diagnosis present

## 2016-03-25 DIAGNOSIS — I480 Paroxysmal atrial fibrillation: Secondary | ICD-10-CM | POA: Diagnosis present

## 2016-03-25 DIAGNOSIS — J810 Acute pulmonary edema: Secondary | ICD-10-CM | POA: Diagnosis not present

## 2016-03-25 DIAGNOSIS — E1122 Type 2 diabetes mellitus with diabetic chronic kidney disease: Secondary | ICD-10-CM | POA: Diagnosis present

## 2016-03-25 DIAGNOSIS — S80811A Abrasion, right lower leg, initial encounter: Secondary | ICD-10-CM | POA: Diagnosis present

## 2016-03-25 DIAGNOSIS — I5021 Acute systolic (congestive) heart failure: Secondary | ICD-10-CM

## 2016-03-25 DIAGNOSIS — Z7901 Long term (current) use of anticoagulants: Secondary | ICD-10-CM

## 2016-03-25 DIAGNOSIS — I25709 Atherosclerosis of coronary artery bypass graft(s), unspecified, with unspecified angina pectoris: Secondary | ICD-10-CM

## 2016-03-25 DIAGNOSIS — N189 Chronic kidney disease, unspecified: Secondary | ICD-10-CM | POA: Diagnosis present

## 2016-03-25 DIAGNOSIS — J9601 Acute respiratory failure with hypoxia: Secondary | ICD-10-CM | POA: Diagnosis present

## 2016-03-25 DIAGNOSIS — Z89511 Acquired absence of right leg below knee: Secondary | ICD-10-CM

## 2016-03-25 DIAGNOSIS — I4891 Unspecified atrial fibrillation: Secondary | ICD-10-CM | POA: Diagnosis not present

## 2016-03-25 DIAGNOSIS — E785 Hyperlipidemia, unspecified: Secondary | ICD-10-CM | POA: Diagnosis present

## 2016-03-25 DIAGNOSIS — N179 Acute kidney failure, unspecified: Secondary | ICD-10-CM

## 2016-03-25 DIAGNOSIS — I2510 Atherosclerotic heart disease of native coronary artery without angina pectoris: Secondary | ICD-10-CM | POA: Diagnosis present

## 2016-03-25 DIAGNOSIS — Z79899 Other long term (current) drug therapy: Secondary | ICD-10-CM | POA: Diagnosis not present

## 2016-03-25 DIAGNOSIS — G4733 Obstructive sleep apnea (adult) (pediatric): Secondary | ICD-10-CM

## 2016-03-25 DIAGNOSIS — Z833 Family history of diabetes mellitus: Secondary | ICD-10-CM

## 2016-03-25 DIAGNOSIS — Z86718 Personal history of other venous thrombosis and embolism: Secondary | ICD-10-CM | POA: Diagnosis not present

## 2016-03-25 DIAGNOSIS — G4700 Insomnia, unspecified: Secondary | ICD-10-CM | POA: Diagnosis present

## 2016-03-25 DIAGNOSIS — Z6841 Body Mass Index (BMI) 40.0 and over, adult: Secondary | ICD-10-CM | POA: Diagnosis not present

## 2016-03-25 DIAGNOSIS — I10 Essential (primary) hypertension: Secondary | ICD-10-CM | POA: Diagnosis present

## 2016-03-25 DIAGNOSIS — S80812A Abrasion, left lower leg, initial encounter: Secondary | ICD-10-CM | POA: Diagnosis present

## 2016-03-25 DIAGNOSIS — I509 Heart failure, unspecified: Secondary | ICD-10-CM

## 2016-03-25 LAB — TROPONIN I
TROPONIN I: 0.09 ng/mL — AB (ref <0.03)
Troponin I: 0.09 ng/mL (ref <0.03)

## 2016-03-25 LAB — BASIC METABOLIC PANEL
Anion gap: 7 (ref 5–15)
BUN: 21 mg/dL — AB (ref 6–20)
CO2: 28 mmol/L (ref 22–32)
Calcium: 8.4 mg/dL — ABNORMAL LOW (ref 8.9–10.3)
Chloride: 101 mmol/L (ref 101–111)
Creatinine, Ser: 1.46 mg/dL — ABNORMAL HIGH (ref 0.61–1.24)
GFR calc Af Amer: 55 mL/min — ABNORMAL LOW (ref >60)
GFR, EST NON AFRICAN AMERICAN: 47 mL/min — AB (ref >60)
GLUCOSE: 161 mg/dL — AB (ref 65–99)
POTASSIUM: 4.6 mmol/L (ref 3.5–5.1)
Sodium: 136 mmol/L (ref 135–145)

## 2016-03-25 LAB — CBC WITH DIFFERENTIAL/PLATELET
Basophils Absolute: 0 10*3/uL (ref 0.0–0.1)
Basophils Relative: 0 %
EOS PCT: 1 %
Eosinophils Absolute: 0.1 10*3/uL (ref 0.0–0.7)
HCT: 42.8 % (ref 39.0–52.0)
Hemoglobin: 14.1 g/dL (ref 13.0–17.0)
LYMPHS ABS: 2.5 10*3/uL (ref 0.7–4.0)
LYMPHS PCT: 39 %
MCH: 29.3 pg (ref 26.0–34.0)
MCHC: 32.9 g/dL (ref 30.0–36.0)
MCV: 89 fL (ref 78.0–100.0)
MONO ABS: 0.9 10*3/uL (ref 0.1–1.0)
MONOS PCT: 13 %
Neutro Abs: 2.9 10*3/uL (ref 1.7–7.7)
Neutrophils Relative %: 47 %
PLATELETS: 183 10*3/uL (ref 150–400)
RBC: 4.81 MIL/uL (ref 4.22–5.81)
RDW: 14.8 % (ref 11.5–15.5)
WBC: 6.3 10*3/uL (ref 4.0–10.5)

## 2016-03-25 LAB — GLUCOSE, CAPILLARY
GLUCOSE-CAPILLARY: 167 mg/dL — AB (ref 65–99)
GLUCOSE-CAPILLARY: 149 mg/dL — AB (ref 65–99)
GLUCOSE-CAPILLARY: 225 mg/dL — AB (ref 65–99)

## 2016-03-25 LAB — I-STAT TROPONIN, ED: Troponin i, poc: 0.08 ng/mL (ref 0.00–0.08)

## 2016-03-25 LAB — MRSA PCR SCREENING: MRSA by PCR: POSITIVE — AB

## 2016-03-25 LAB — BRAIN NATRIURETIC PEPTIDE: B Natriuretic Peptide: 1098.2 pg/mL — ABNORMAL HIGH (ref 0.0–100.0)

## 2016-03-25 MED ORDER — ALBUTEROL SULFATE HFA 108 (90 BASE) MCG/ACT IN AERS: 2.0000 | INHALATION_SPRAY | Freq: Four times a day (QID) | RESPIRATORY_TRACT | Status: DC | PRN

## 2016-03-25 MED ORDER — AZITHROMYCIN 250 MG PO TABS
250.0000 mg | ORAL_TABLET | Freq: Every day | ORAL | Status: AC
  Administered 2016-03-25 – 2016-03-28 (×4): 250 mg via ORAL
  Filled 2016-03-25 (×4): qty 1

## 2016-03-25 MED ORDER — SODIUM CHLORIDE 0.9% FLUSH: 3.0000 mL | INTRAVENOUS | Status: DC | PRN

## 2016-03-25 MED ORDER — ROSUVASTATIN CALCIUM 20 MG PO TABS
20.0000 mg | ORAL_TABLET | Freq: Every day | ORAL | Status: DC
  Administered 2016-03-25 – 2016-04-02 (×9): 20 mg via ORAL
  Filled 2016-03-25 (×9): qty 1

## 2016-03-25 MED ORDER — FUROSEMIDE 10 MG/ML IJ SOLN
20.0000 mg | Freq: Once | INTRAMUSCULAR | Status: DC
  Filled 2016-03-25: qty 2

## 2016-03-25 MED ORDER — FUROSEMIDE 10 MG/ML IJ SOLN: 40.0000 mg | Freq: Once | INTRAMUSCULAR | Status: DC

## 2016-03-25 MED ORDER — RIVAROXABAN 20 MG PO TABS
20.0000 mg | ORAL_TABLET | Freq: Every day | ORAL | Status: DC
  Administered 2016-03-25 – 2016-04-02 (×9): 20 mg via ORAL
  Filled 2016-03-25 (×11): qty 1

## 2016-03-25 MED ORDER — ONDANSETRON HCL 4 MG/2ML IJ SOLN: 4.0000 mg | Freq: Four times a day (QID) | INTRAMUSCULAR | Status: DC | PRN

## 2016-03-25 MED ORDER — BENZONATATE 100 MG PO CAPS
100.0000 mg | ORAL_CAPSULE | Freq: Three times a day (TID) | ORAL | Status: DC | PRN
  Administered 2016-03-28 – 2016-03-30 (×4): 100 mg via ORAL
  Filled 2016-03-25 (×4): qty 1

## 2016-03-25 MED ORDER — SODIUM CHLORIDE 0.9% FLUSH
3.0000 mL | Freq: Two times a day (BID) | INTRAVENOUS | Status: DC
  Administered 2016-03-25 – 2016-04-01 (×8): 3 mL via INTRAVENOUS

## 2016-03-25 MED ORDER — INSULIN ASPART 100 UNIT/ML ~~LOC~~ SOLN
0.0000 [IU] | Freq: Three times a day (TID) | SUBCUTANEOUS | Status: DC
  Administered 2016-03-25: 2 [IU] via SUBCUTANEOUS
  Administered 2016-03-25: 3 [IU] via SUBCUTANEOUS
  Administered 2016-03-26 (×2): 2 [IU] via SUBCUTANEOUS
  Administered 2016-03-27 (×3): 3 [IU] via SUBCUTANEOUS
  Administered 2016-03-28: 5 [IU] via SUBCUTANEOUS
  Administered 2016-03-28 (×2): 3 [IU] via SUBCUTANEOUS
  Administered 2016-03-29: 8 [IU] via SUBCUTANEOUS
  Administered 2016-03-29 – 2016-03-30 (×2): 5 [IU] via SUBCUTANEOUS
  Administered 2016-03-30: 3 [IU] via SUBCUTANEOUS
  Administered 2016-03-30 – 2016-03-31 (×2): 2 [IU] via SUBCUTANEOUS
  Administered 2016-03-31 – 2016-04-01 (×3): 5 [IU] via SUBCUTANEOUS
  Administered 2016-04-02: 2 [IU] via SUBCUTANEOUS

## 2016-03-25 MED ORDER — LOSARTAN POTASSIUM 25 MG PO TABS
25.0000 mg | ORAL_TABLET | Freq: Every day | ORAL | Status: DC
  Administered 2016-03-25 – 2016-03-26 (×2): 25 mg via ORAL
  Filled 2016-03-25 (×2): qty 1

## 2016-03-25 MED ORDER — CLOPIDOGREL BISULFATE 75 MG PO TABS
75.0000 mg | ORAL_TABLET | Freq: Every day | ORAL | Status: DC
  Administered 2016-03-25 – 2016-04-02 (×9): 75 mg via ORAL
  Filled 2016-03-25 (×9): qty 1

## 2016-03-25 MED ORDER — METOPROLOL SUCCINATE ER 25 MG PO TB24: 50.0000 mg | ORAL_TABLET | Freq: Two times a day (BID) | ORAL | Status: DC

## 2016-03-25 MED ORDER — FUROSEMIDE 40 MG PO TABS
40.0000 mg | ORAL_TABLET | Freq: Once | ORAL | Status: AC
  Administered 2016-03-25: 40 mg via ORAL
  Filled 2016-03-25: qty 1

## 2016-03-25 MED ORDER — AZITHROMYCIN 250 MG PO TABS: 250.0000 mg | ORAL_TABLET | Freq: Every day | ORAL | Status: DC

## 2016-03-25 MED ORDER — METOPROLOL SUCCINATE ER 25 MG PO TB24
75.0000 mg | ORAL_TABLET | Freq: Two times a day (BID) | ORAL | Status: DC
  Administered 2016-03-25 – 2016-03-26 (×4): 75 mg via ORAL
  Filled 2016-03-25 (×4): qty 3

## 2016-03-25 MED ORDER — SODIUM CHLORIDE 0.9 % IV SOLN
250.0000 mL | INTRAVENOUS | Status: DC | PRN
  Administered 2016-04-02: 250 mL via INTRAVENOUS

## 2016-03-25 MED ORDER — INSULIN GLARGINE 100 UNIT/ML ~~LOC~~ SOLN
10.0000 [IU] | Freq: Every day | SUBCUTANEOUS | Status: DC
  Administered 2016-03-25 – 2016-03-27 (×3): 10 [IU] via SUBCUTANEOUS
  Filled 2016-03-25 (×3): qty 0.1

## 2016-03-25 MED ORDER — ACETAMINOPHEN 325 MG PO TABS
650.0000 mg | ORAL_TABLET | ORAL | Status: DC | PRN
  Administered 2016-03-31: 650 mg via ORAL
  Filled 2016-03-25: qty 2

## 2016-03-25 MED ORDER — ALBUTEROL SULFATE (2.5 MG/3ML) 0.083% IN NEBU
2.5000 mg | INHALATION_SOLUTION | Freq: Four times a day (QID) | RESPIRATORY_TRACT | Status: DC | PRN
  Administered 2016-03-26 – 2016-03-27 (×3): 2.5 mg via RESPIRATORY_TRACT
  Filled 2016-03-25 (×3): qty 3

## 2016-03-25 MED ORDER — INSULIN ASPART 100 UNIT/ML ~~LOC~~ SOLN
0.0000 [IU] | Freq: Every day | SUBCUTANEOUS | Status: DC
  Administered 2016-03-25 – 2016-04-01 (×2): 2 [IU] via SUBCUTANEOUS

## 2016-03-25 NOTE — ED Notes (Signed)
As I write this, the [facility] is seeing pt. He is sleepy, and in no distress on his bi-pap. Dr. Wilkie AyeHorton peaks into his room and observes that pt. Is breathing much easier now than he was initially.

## 2016-03-25 NOTE — ED Notes (Addendum)
Sleep apnea noted resting in bed with eyes closed call light in reach informed Dr Wilkie Aye of sleep apnea placed pt on O2 2L [place].

## 2016-03-25 NOTE — ED Provider Notes (Signed)
[facility] DEPT Provider Note   CSN: [id number] Arrival date & time: 03/25/16  0455     History   Chief Complaint Chief Complaint  Patient presents with  . Leg Swelling    HPI Leroy [name] is a 70 y.o. male.  HPI  This is a 70 year old male with history of coronary artery disease, diabetes, atrial flutter, bilateral BKA who presents with "my legs leaking." Patient states that he was seen and evaluated Saturday night after a fall. Since that time he has noted "water on my legs."  Patient states "my sheets were wet from the water." Denies history of same. Does not take diuretics. Patient reports baseline shortness of breath.  Recent diagnosis of bronchitis. Denies fever, cough, chest pain. Denies any injury. Reports that he sustained abrasions during his fall.   Patient with recent admission and NSTEMI.  Cardiac cath with extensive disease. Medical management recommended. EF of 10-15%. Not on any diuretics.  Past Medical History:  Diagnosis Date  . Atrial flutter ([place])   . Cataract   . Chronic kidney disease    on Lisinopril to protect kidneys d/t being on Metformin per pt  . Chronic systolic CHF (congestive heart failure) ([place])   . Coronary artery disease    a. s/p remote CABG 2001 at [facility] b. Cath 03/2010: for med rx.;  c.  Carlton Adam myoview (1/14):  Large Inferior apical MI, very small area of peri-infarct ischemia toward the inferior apical segment. EF 19%.  Med Rx was continued.    . Diabetes mellitus 1979   takes Metformni daily  and Lantus 50units bid  . DVT (deep venous thrombosis) ([place]) 2011   legs  . H/O hiatal hernia   . H/O medication noncompliance    Due to insurance issues  . History of blood clots    in legs--this was in 2011--has been off of Coumadin 31month;takes Xarelto daily  . Hyperlipidemia   . Hypotension   . Ischemic cardiomyopathy    a. EF 40% 2010. b. Echo (2/14):  mild LVH, EF 30-35%, diff HK, Gr 1 DD, MAC, mild MR, mod LAE, PASP 39  . Joint  pain   . Myocardial infarction    total of 8 heart attacks;last one in 2011  . Paroxysmal atrial fibrillation (HCC)   . Peripheral neuropathy ([place]   . Peripheral vascular disease ([place]    a. s/p L BKA 2014.  .Marland KitchenShortness of breath dyspnea   . Stroke ([facility]     Patient Active Problem List   Diagnosis Date Noted  . Noncompliance 02/23/2016  . SOB (shortness of breath)   . NSTEMI (non-ST elevated myocardial infarction) ([place] 02/22/2016  . Chest pain, atypical 02/22/2016  . Essential hypertension 02/22/2016  . Ischemic colitis ([place] 10/25/2015  . Pain, abdominal, RLQ 10/23/2015  . Hemispheric carotid artery syndrome   . Coronary artery disease involving coronary bypass graft of native heart with angina pectoris ([place]   . PVD (peripheral vascular disease) (HEstill   . 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 Diabetes mellitus type 2 with peripheral artery disease ([place] 05/22/2015  . Stroke due to embolism ([place]   . Microalbuminuria 09/29/2014  . 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 Phantom limb pain ([place] 08/16/2013  . Pain in limb- Right stump 07/06/2013  . Atherosclerosis of native arteries of the extremities with ulceration ([place] 06/25/2013  . S/P bilateral BKA (below knee amputation) ([place] 04/21/2013  . Chronic systolic heart failure ([place] 04/15/2013  . Femoral  artery stenosis ([place]) 03/25/2013  . S/P BKA (below knee amputation) bilateral ([place]) 09/10/2012  . SVT (supraventricular tachycardia) ([place]) 09/02/2012  . Hyperlipidemia with target LDL less than 70 01/02/2012  . ATRIAL FIBRILLATION, PAROXYSMAL 04/13/2010    Past Surgical History:  Procedure Laterality Date  . ABDOMINAL AORTAGRAM  04-30-12  . ABDOMINAL AORTAGRAM N/A 04/30/2012   Procedure: ABDOMINAL Maxcine Ham;  Surgeon: Conrad Avery, MD;  Location[facility] C[facility] LAB;  Service: Cardiovascular;  Laterality: N/A;  . ADENOIDECTOMY    . AMPUTATION Left 06/03/2012   Procedure: AMPUTATION BELOW KNEE;  Surgeon:  Conrad Merrimac, MD;  Location: [place];  Service: Vascular;  Laterality: Left;  . AMPUTATION Right 04/14/2013   Procedure: AMPUTATION BELOW KNEE ;  Surgeon: Conrad Gerton, MD;  Location: [place];  Service: Vascular;  Laterality: Right;  . APPLICATION OF WOUND VAC Right 04/20/2014   Procedure: APPLICATION OF WOUND VAC;  Surgeon: Conrad [place], MD;  Location: [place];  Service: Vascular;  Laterality: Right;  . CARDIAC CATHETERIZATION  03/19/10  . CARDIAC CATHETERIZATION N/A 02/22/2016   Procedure: Left Heart Cath and Cors/Grafts Angiography;  Surgeon: Jettie Booze, MD;  Location: [place] CV LAB;  Service: Cardiovascular;  Laterality: N/A;  . CARDIAC CATHETERIZATION N/A 02/22/2016   Procedure: Coronary Stent Intervention;  Surgeon: Jettie Booze, MD;  Location: [place] CV LAB;  Service: Cardiovascular;  Laterality: N/A;  . CARDIAC CATHETERIZATION    . COLONOSCOPY WITH PROPOFOL N/A 10/24/2015   Procedure: COLONOSCOPY WITH PROPOFOL;  Surgeon: Milus Banister, MD;  Location: [facility] ENDOSCOPY;  Service: Endoscopy;  Laterality: N/A;  . CORONARY ANGIOPLASTY  2002   3 stents  . CORONARY ARTERY BYPASS GRAFT  03/2000   quadruple  . ENDARTERECTOMY FEMORAL Right 03/25/2013   Procedure: ENDARTERECTOMY FEMORAL ;  Surgeon: Conrad Seacliff, MD;  Location: [place];  Service: Vascular;  Laterality: Right;  . EYE SURGERY Right    Catarct  . HERNIA REPAIR     Hiatal Hernia  . I&D EXTREMITY Right 04/14/2013   Procedure: IRRIGATION AND DEBRIDEMENT OF RIGHT  GROIN & PLACEMENT OF VAC DRESSING;  Surgeon: Conrad [place], MD;  Location: [place];  Service: Vascular;  Laterality: Right;  . I&D EXTREMITY Right 04/20/2014   Procedure: DEBRIDEMENT OF RIGHT BELOW KNEE AMPUTATION STUMP;  Surgeon: Conrad Iberia, MD;  Location: [place];  Service: Vascular;  Laterality: Right;  . LOWER EXTREMITY ANGIOGRAM Bilateral 04/30/2012   Procedure: LOWER EXTREMITY ANGIOGRAM;  Surgeon: Conrad Nitro, MD;  Location[facility] C[facility] LAB;  Service:  Cardiovascular;  Laterality: Bilateral;  bilat lower extrem angio  . PATCH ANGIOPLASTY Right 03/25/2013   Procedure: PATCH ANGIOPLASTY USING VASCU-GUARD PERIPHERAL VASCULAR PATCH;  Surgeon: Conrad , MD;  Location: [place];  Service: Vascular;  Laterality: Right;  . revasculariztion  2011/2010   x 2   . TONSILLECTOMY    . WOUND DEBRIDEMENT Right 04/20/2014   s/p  rt bka  with application of wound vac       Home Medications    Prior to Admission medications   Medication Sig Start Date End Date Taking? Authorizing Provider  albuterol (PROVENTIL HFA;VENTOLIN HFA) 108 (90 Base) MCG/ACT inhaler Inhale 2 puffs into the lungs every 6 (six) hours as needed for wheezing or shortness of breath. 03/23/16  Yes Debbrah Alar, NP  azithromycin (ZITHROMAX Z-PAK) 250 MG tablet 2 tabs by mouth today, then one tab once daily for 4 more days 03/23/16  Yes Melissa  Inda Castle, NP  benzonatate (TESSALON) 100 MG capsule Take 1 capsule (100 mg total) by mouth 3 (three) times daily as needed. Patient taking differently: Take 100 mg by mouth 3 (three) times daily as needed for cough.  03/23/16  Yes Debbrah Alar, NP  Blood Glucose Monitoring Suppl (PRODIGY AUTOCODE BLOOD GLUCOSE) w/Device KIT 1 kit 03/23/16  Yes Debbrah Alar, NP  clopidogrel (PLAVIX) 75 MG tablet Take 1 tablet (75 mg total) by mouth daily. 02/25/16  Yes Clanford Marisa Hua, MD  glucose blood test strip Test 3 times daily. 03/23/16  Yes Debbrah Alar, NP  insulin aspart (NOVOLOG) 100 UNIT/ML injection Inject 10 Units into the skin 3 (three) times daily as needed for high blood sugar (BLOOD SUGARS GREATER THAN 200).    Yes Historical Provider, MD  Insulin Glargine (LANTUS) 100 UNIT/ML Solostar Pen Inject 50 Units into the skin 2 (two) times daily. 01/05/16  Yes Janith Lima, MD  Insulin Pen Needle (B-D UF III MINI PEN NEEDLES) 31G X 5 MM MISC Use to inject insulin as directed. 11/27/15  Yes Janith Lima, MD  lisinopril  (PRINIVIL,ZESTRIL) 5 MG tablet Take 5 mg by mouth daily.   Yes Historical Provider, MD  losartan (COZAAR) 25 MG tablet Take 1 tablet (25 mg total) by mouth daily. 02/26/16  Yes Clanford Marisa Hua, MD  metoprolol succinate (TOPROL-XL) 25 MG 24 hr tablet Take 1 tablet (25 mg total) by mouth 2 (two) times daily. Take with 50 mg dose to equal (75 mg) twice daily 03/22/16  Yes Brittainy Erie Noe, PA-C  metoprolol succinate (TOPROL-XL) 50 MG 24 hr tablet Take 1 tablet (50 mg total) by mouth 2 (two) times daily. Take with or immediately following a meal. 03/22/16 06/20/16 Yes Brittainy Erie Noe, PA-C  nitroGLYCERIN (NITROSTAT) 0.4 MG SL tablet Place 1 tablet (0.4 mg total) under the tongue every 5 (five) minutes as needed for chest pain. 05/07/13  Yes Daniel J Angiulli, PA-C  rivaroxaban (XARELTO) 20 MG TABS tablet Take 1 tablet (20 mg total) by mouth daily at 6 PM. 02/25/16  Yes Clanford L Johnson, MD  rosuvastatin (CRESTOR) 20 MG tablet Take 20 mg by mouth daily.   Yes Historical Provider, MD  ezetimibe (ZETIA) 10 MG tablet Take 1 tablet (10 mg total) by mouth daily. Patient not taking: Reported on 03/25/2016 05/07/13   Lavon Paganini Angiulli, PA-C    Family History Family History  Problem Relation Age of Onset  . Heart disease Mother     before age 60  . Diabetes Mother   . Heart attack Mother   . Stroke Mother   . Heart disease Father   . Colon cancer Neg Hx   . Stomach cancer Neg Hx   . Pancreatic cancer Neg Hx   . Esophageal cancer Neg Hx     Social History Social History  Substance Use Topics  . Smoking status: Former Smoker    Types: Cigarettes  . Smokeless tobacco: Never Used     Comment: quit smoking 80yr ago  . Alcohol use No     Allergies   Statins; Amlodipine besylate; and Cephalexin   Review of Systems Review of Systems  Constitutional: Negative for fever.  Respiratory: Positive for shortness of breath. Negative for cough.   Cardiovascular: Positive for leg swelling.  Negative for chest pain.  Gastrointestinal: Negative for abdominal pain, nausea and vomiting.  Skin: Positive for wound.  All other systems reviewed and are negative.    Physical Exam Updated  Vital Signs BP 101/62 (BP Location: Left Arm)   Pulse 84   Temp 97.2 F (36.2 C) (Oral)   Resp 22   Ht _0  (1.575 m)   Wt 277 lb (125.6 kg)   SpO2 100%   BMI 50.66 kg/m   Physical Exam  Constitutional: He is oriented to person, place, and time. No distress.  Chronically ill-appearing, no acute distress, overweight  HENT:  Head: Normocephalic and atraumatic.  Cardiovascular: Normal rate, regular rhythm and normal heart sounds.   Pulmonary/Chest: Effort normal. No respiratory distress. He has wheezes.  Occasional wheeze  Abdominal: Soft. Bowel sounds are normal. There is no tenderness. There is no rebound.  Musculoskeletal: He exhibits edema.  2+ lower extremity edema, weeping noted Bilateral BKA  Neurological: He is alert and oriented to person, place, and time.  Skin: Skin is warm and dry.  Superficial scattered abrasions bilateral inner thighs  Psychiatric: He has a normal mood and affect.  Nursing note and vitals reviewed.    ED Treatments / Results  Labs (all labs ordered are listed, but only abnormal results are displayed) Labs Reviewed  BASIC METABOLIC PANEL - Abnormal; Notable for the following:       Result Value   Glucose, Bld 161 (*)    BUN 21 (*)    Creatinine, Ser 1.46 (*)    Calcium 8.4 (*)    GFR calc non Af Amer 47 (*)    GFR calc Af Amer 55 (*)    All other components within normal limits  BRAIN NATRIURETIC PEPTIDE - Abnormal; Notable for the following:    B Natriuretic Peptide 1,098.2 (*)    All other components within normal limits  CBC WITH DIFFERENTIAL/PLATELET  Randolm Idol, ED    EKG  EKG Interpretation  Date/Time:  Monday March 25 2016 05:24:59 EST Ventricular Rate:  89 PR Interval:    QRS Duration: 127 QT Interval:  410 QTC  Calculation: 499 R Axis:   87 Text Interpretation:  Sinus rhythm Multiple ventricular premature complexes IVCD, consider atypical LBBB No significant change since last tracing Confirmed by Shiela Bruns  MD, Norissa Bartee ([id number]) on 03/25/2016 5:30:25 AM       Radiology Dg Chest Portable 1 View  Result Date: 03/25/2016 CLINICAL DATA:  Shortness of breath. Bilateral lower extremity edema. 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 The cardiac silhouette remains enlarged. There is new pulmonary vascular congestion with indistinctness of the pulmonary vasculature and with bilateral perihilar and bibasilar interstitial and hazy airspace opacities. Denser airspace opacity is present in the left lower lobe, and there are likely small bilateral pleural effusions. No pneumothorax is seen. No acute osseous abnormality is identified. IMPRESSION: Cardiomegaly with new small bilateral pleural effusions and bibasilar airspace opacities compatible with edema. Superimposed infection not excluded in the left lower lobe. Electronically Signed   By: Logan Bores M.D.   On: 03/25/2016 07:00   Dg Knee Complete 4 Views Left  Result Date: 03/23/2016 CLINICAL DATA:  Fall out of wheelchair today. Left knee pain. Initial encounter. EXAM: LEFT KNEE - COMPLETE 4+ VIEW COMPARISON:  None. FINDINGS: No evidence of fracture, dislocation, or joint effusion. No evidence of arthropathy or other focal bone abnormality. Previous below-the-knee amputation, without evidence of osteolysis or periostitis. Prepatellar and infrapatellar soft tissue swelling noted. Peripheral vascular calcification also demonstrated. IMPRESSION: Prepatellar and infrapatellar soft tissue swelling. No evidence of acute fracture or dislocation. Below-the-knee amputation and peripheral vascular disease.  Electronically Signed   By: Earle Gell M.D.   On: 03/23/2016 16:05   Dg Knee Complete 4 Views  Right  Result Date: 03/23/2016 CLINICAL DATA:  [place] out of wheelchair today. Right knee pain. Initial encounter. EXAM: RIGHT KNEE - COMPLETE 4+ VIEW COMPARISON:  02/22/2014 FINDINGS: No evidence of fracture, dislocation, or joint effusion. Moderate tricompartmental osteoarthritis. Previous below-the-knee amputation is stable in appearance. No evidence of osteolysis or periostitis. Prepatellar soft tissue swelling noted. Peripheral vascular calcification also demonstrated. IMPRESSION: Prepatellar soft tissue swelling. No evidence of fracture or dislocation. Moderate tricompartmental osteoarthritis. Stable appearance of below-the-knee amputation. Electronically Signed   By: Earle Gell M.D.   On: 03/23/2016 16:03    Procedures Procedures (including critical care time)  CRITICAL CARE Performed by: Merryl Hacker   Total critical care time:25 minutes  Critical care time was exclusive of separately billable procedures and treating other patients.  Critical care was necessary to treat or prevent imminent or life-threatening deterioration.  Critical care was time spent personally by me on the following activities: development of treatment plan with patient and/or surrogate as well as nursing, discussions with consultants, evaluation of patient's response to treatment, examination of patient, obtaining history from patient or surrogate, ordering and performing treatments and interventions, ordering and review of laboratory studies, ordering and review of radiographic studies, pulse oximetry and re-evaluation of patient's condition.   Angiocath insertion Performed by: Merryl Hacker  Consent: Verbal consent obtained. Risks and benefits: risks, benefits and alternatives were discussed Time out: Immediately prior to procedure a "time out" was called to verify the correct patient, procedure, equipment, support staff and site/side marked as required.  Preparation: Patient was prepped and  draped in the usual sterile fashion.  Vein Location: left basilic   Ultrasound Guided  Gauge: 20  Normal blood return and flush without difficulty Patient tolerance: Patient tolerated the procedure well with no immediate complications.     Medications Ordered in ED Medications  furosemide (LASIX) injection 40 mg (not administered)     Initial Impression / Assessment and Plan / ED Course  I have reviewed the triage vital signs and the nursing notes.  Pertinent labs & imaging results that were available during my care of the patient were reviewed by me and considered in my medical decision making (see chart for details).  Clinical Course as of Mar 25 733  Mon Mar 25, 2016  0710 Chest x-ray with new effusions and pulmonary edema suggestive of CHF. Clinically, patient wheezing. Drops O2 sats when sleeping. He wears C Pap at home. This was ordered for the patient. Given recent in STEMI and EF of 10%, will admit for initiation of diuresis.   [CH]    Clinical Course User Index [CH] Merryl Hacker, MD    Patient presents with lower extremity swelling and weeping.  For shortness of breath. Has baseline shortness of breath as well. Nontoxic. Initial vital signs reassuring. Was noted to drop his oxygen saturations was sleeping. C Pap was ordered. Suspect given his history, acute CHF exacerbation. BNP greater than 1000. Chest x-ray with bilateral pleural effusions. Patient given 40 mg IV Lasix. BP marginal without much room for aggressive measures.  Discussed with the hospitalist. They are requesting cardiology consult.  7:46 AM Discussed with Dorene Ar, NP with cardiology.  They will evaluate patient.   Final Clinical Impressions(s) / ED Diagnoses   Final diagnoses:  Acute systolic congestive heart failure (HCC)  Acute pulmonary edema ([place])    New  Prescriptions New Prescriptions   No medications on file     Merryl Hacker, MD 03/25/16 [phone]

## 2016-03-25 NOTE — Progress Notes (Signed)
CRITICAL VALUE ALERT  Critical value received: troponin 0.09  Date of notification:  03/25/16  Time of notification:  1419  Critical value read back:Yes.    Nurse who received alert:  Heloise Purpura  MD notified (1st page):  Dr Rinaldo Ratel  Time of first page:  1425  MD notified (2nd page):  Time of second page:  Responding MD:  Dr Rinaldo Ratel  Time MD responded:  1430

## 2016-03-25 NOTE — Care Management Note (Addendum)
Case Management Note  Patient Details  Name: Tina GriffithsWilliam J Snelling MRN: [id number] Date of Birth: Aug 14, 1945  Subjective/Objective:        Congestive heart failure in patient with no further revascularization options.            Action/Plan: Lives alone. Will need home heart failure screening.  Will sent referral to [facility].  Date:  March 25, 2016 Chart reviewed for concurrent status and case management needs. Will continue to follow patient progress. Discharge Planning: following for needs Expected discharge date: [id number] Marcelle SmilingRhonda Ausencio Vaden, BSN, [place]RN3, ConnecticutCCM   [phone] Expected Discharge Date:                  Expected Discharge Plan:  Home/Self Care  In-House Referral:     Discharge planning Services     Post Acute Care Choice:    Choice offered to:     DME Arranged:    DME Agency:     HH Arranged:    HH Agency:     Status of Service:  In process, will continue to follow  If discussed at Long Length of Stay Meetings, dates discussed:    Additional Comments:  Golda AcreDavis, Chong January Lynn, RN 03/25/2016, 10:10 AM

## 2016-03-25 NOTE — ED Triage Notes (Signed)
Swelling of legs bil since Saturday see here there same day for same here today for swelling in legs today no other complaints.

## 2016-03-25 NOTE — ED Notes (Signed)
Bed: WA17 Expected date:  Expected time:  Means of arrival:  Comments: EMS  Edema bilat amputee

## 2016-03-25 NOTE — Consult Note (Signed)
CONSULT NOTE  Date: 03/25/2016               Patient Name:  Leroy [name] MRN: [id number]  DOB: Apr 12, 1945 Age / Sex: 70 y.o., male        PCP: [name] Primary Cardiologist: Percival Spanish             Referring Physician: Adair Patter               Reason for Consult: Hx of CHF , CAD            History of Present Illness: Patient is a 70 y.o. male with a PMHx of CHF - EF 10% , CHF, DM who was admitted to [facility] on 03/25/2016 for evaluation of a mechanical fall out of his wheelchair.   Pt has a hx of CAD , CABG cath recently( Nov. 16, 2017)  by Dr. Irish Lack showed occluded LIMA to LAD graft.   The native LAD was angioplastied and he had a marginal result.   The prox LAD was opened but still significantly diseased and there was no flow to the distal LAD  also has occluded RCA and occluded OM1 and OM2 .   He was rolling down a wheelchair ramp 2 days and his wheelchair flipped over. Had superficial injuries.   Was see in the ER and was sent home Today the scratches in his legs started oozing fluid and he came back to the ER Cardiology was consulted for further eval because of the leg oozing  He has had lots of leg swelling . 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  He cooks for himself. Tries to avoid salt Eats some canned foods.   Breathing has been the same .   Has chronic wheezing    Medications: Outpatient medications:  (Not in a hospital admission)  Current medications: Current Facility-Administered Medications  Medication Dose Route Frequency Provider Last Rate Last Dose  . furosemide (LASIX) injection 20 mg  20 mg Intravenous Once Eber Jones, MD       Current Outpatient Prescriptions  Medication Sig Dispense Refill  . albuterol (PROVENTIL HFA;VENTOLIN HFA) 108 (90 Base) MCG/ACT inhaler Inhale 2 puffs into the lungs every 6 (six) hours as needed for wheezing or shortness of breath. 1 Inhaler 0  . azithromycin (ZITHROMAX  Z-PAK) 250 MG tablet 2 tabs by mouth today, then one tab once daily for 4 more days 6 tablet 0  . benzonatate (TESSALON) 100 MG capsule Take 1 capsule (100 mg total) by mouth 3 (three) times daily as needed. (Patient taking differently: Take 100 mg by mouth 3 (three) times daily as needed for cough. ) 20 capsule 0  . Blood Glucose Monitoring Suppl (PRODIGY AUTOCODE BLOOD GLUCOSE) w/Device KIT 1 kit 1 each 0  . clopidogrel (PLAVIX) 75 MG tablet Take 1 tablet (75 mg total) by mouth daily. 30 tablet 0  . glucose blood test strip Test 3 times daily. 100 each 1  . insulin aspart (NOVOLOG) 100 UNIT/ML injection Inject 10 Units into the skin 3 (three) times daily as needed for high blood sugar (BLOOD SUGARS GREATER THAN 200).     . Insulin Glargine (LANTUS) 100 UNIT/ML Solostar Pen Inject 50 Units into the skin 2 (two) times daily. 15 mL 3  . Insulin Pen Needle (B-D UF III MINI PEN NEEDLES) 31G X 5 MM MISC Use to inject insulin as directed. 270 each  3  . lisinopril (PRINIVIL,ZESTRIL) 5 MG tablet Take 5 mg by mouth daily.    [name] losartan (COZAAR) 25 MG tablet Take 1 tablet (25 mg total) by mouth daily. 30 tablet 0  . metoprolol succinate (TOPROL-XL) 25 MG 24 hr tablet Take 1 tablet (25 mg total) by mouth 2 (two) times daily. Take with 50 mg dose to equal (75 mg) twice daily 60 tablet 9  . metoprolol succinate (TOPROL-XL) 50 MG 24 hr tablet Take 1 tablet (50 mg total) by mouth 2 (two) times daily. Take with or immediately following a meal. 60 tablet 9  . nitroGLYCERIN (NITROSTAT) 0.4 MG SL tablet Place 1 tablet (0.4 mg total) under the tongue every 5 (five) minutes as needed for chest pain. 30 tablet 12  . rivaroxaban (XARELTO) 20 MG TABS tablet Take 1 tablet (20 mg total) by mouth daily at 6 PM. 30 tablet 0  . rosuvastatin (CRESTOR) 20 MG tablet Take 20 mg by mouth daily.    [name] ezetimibe (ZETIA) 10 MG tablet Take 1 tablet (10 mg total) by mouth daily. (Patient not taking: Reported on 03/25/2016) 30 tablet 1       Allergies  Allergen Reactions  . Statins Other (See Comments)    Reaction:  Muscle pain   . Amlodipine Besylate Other (See Comments)    Reaction:  Muscle pain   . Cephalexin Hives     Past Medical History:  Diagnosis Date  . Atrial flutter ([place])   . Cataract   . Chronic kidney disease    on Lisinopril to protect kidneys d/t being on Metformin per pt  . Chronic systolic CHF (congestive heart failure) ([place])   . Coronary artery disease    a. s/p remote CABG 2001 at [facility] b. Cath 03/2010: for med rx.;  c.  Carlton Adam myoview (1/14):  Large Inferior apical MI, very small area of peri-infarct ischemia toward the inferior apical segment. EF 19%.  Med Rx was continued.    . Diabetes mellitus 1979   takes Metformni daily  and Lantus 50units bid  . DVT (deep venous thrombosis) ([place]) 2011   legs  . H/O hiatal hernia   . H/O medication noncompliance    Due to insurance issues  . History of blood clots    in legs--this was in 2011--has been off of Coumadin 22month;takes Xarelto daily  . Hyperlipidemia   . Hypotension   . Ischemic cardiomyopathy    a. EF 40% 2010. b. Echo (2/14):  mild LVH, EF 30-35%, diff HK, Gr 1 DD, MAC, mild MR, mod LAE, PASP 39  . Joint pain   . Myocardial infarction    total of 8 heart attacks;last one in 2011  . Paroxysmal atrial fibrillation ([facility])   . Peripheral neuropathy ([place]   . [place]ipheral [place] disease [place]    a. s/p L BKA 2014.  .Marland KitchenShortness of breath dyspnea   . Stroke ([facility]     Past Surgical History:  Procedure Laterality Date  . ABDOMINAL AORTAGRAM  04-30-12  . ABDOMINAL AORTAGRAM N/A 04/30/2012   Procedure: ABDOMINAL AMaxcine Ham  Surgeon: BConrad Belleplain MD;  [facility];  Service: Cardiovascular;  Laterality: N/A;  . ADENOIDECTOMY    . AMPUTATION Left 06/03/2012   Procedure: AMPUTATION BELOW KNEE;  Surgeon: BConrad Progress MD;  [place]  Service: Vascular;  Laterality: Left;  . AMPUTATION Right 04/14/2013   Procedure: AMPUTATION  BELOW KNEE ;  Surgeon: BConrad Zuehl MD;  [facility];  Service: Vascular;  Laterality: Right;  . APPLICATION OF WOUND VAC Right 04/20/2014   Procedure: APPLICATION OF WOUND VAC;  Surgeon: Conrad [place], MD;  Location: [place];  Service: Vascular;  Laterality: Right;  . CARDIAC CATHETERIZATION  03/19/10  . CARDIAC CATHETERIZATION N/A 02/22/2016   Procedure: Left Heart Cath and Cors/Grafts Angiography;  Surgeon: Jettie Booze, MD;  Location: [place] CV LAB;  Service: Cardiovascular;  Laterality: N/A;  . CARDIAC CATHETERIZATION N/A 02/22/2016   Procedure: Coronary Stent Intervention;  Surgeon: Jettie Booze, MD;  Location: [place] CV LAB;  Service: Cardiovascular;  Laterality: N/A;  . CARDIAC CATHETERIZATION    . COLONOSCOPY WITH PROPOFOL N/A 10/24/2015   Procedure: COLONOSCOPY WITH PROPOFOL;  Surgeon: Milus Banister, MD;  Location: [facility] ENDOSCOPY;  Service: Endoscopy;  Laterality: N/A;  . CORONARY ANGIOPLASTY  2002   3 stents  . CORONARY ARTERY BYPASS GRAFT  03/2000   quadruple  . ENDARTERECTOMY FEMORAL Right 03/25/2013   Procedure: ENDARTERECTOMY FEMORAL ;  Surgeon: Conrad Bartlett, MD;  Location: [place];  Service: Vascular;  Laterality: Right;  . EYE SURGERY Right    Catarct  . HERNIA REPAIR     Hiatal Hernia  . I&D EXTREMITY Right 04/14/2013   Procedure: IRRIGATION AND DEBRIDEMENT OF RIGHT  GROIN & PLACEMENT OF VAC DRESSING;  Surgeon: Conrad [place], MD;  Location: [place];  Service: Vascular;  Laterality: Right;  . I&D EXTREMITY Right 04/20/2014   Procedure: DEBRIDEMENT OF RIGHT BELOW KNEE AMPUTATION STUMP;  Surgeon: Conrad Gaffney, MD;  Location: [place];  Service: Vascular;  Laterality: Right;  . LOWER EXTREMITY ANGIOGRAM Bilateral 04/30/2012   Procedure: LOWER EXTREMITY ANGIOGRAM;  Surgeon: Conrad Gallaway, MD;  Location[facility] CATH LAB;  Service: Cardiovascular;  Laterality: Bilateral;  bilat lower extrem angio  . PATCH ANGIOPLASTY Right 03/25/2013   Procedure: PATCH ANGIOPLASTY USING  VASCU-GUARD PERIPHERAL VASCULAR PATCH;  Surgeon: Conrad McMechen, MD;  Location: [place];  Service: Vascular;  Laterality: Right;  . revasculariztion  2011/2010   x 2   . TONSILLECTOMY    . WOUND DEBRIDEMENT Right 04/20/2014   s/p  rt bka  with application of wound vac    Family History  Problem Relation Age of Onset  . Heart disease Mother     before age 76  . Diabetes Mother   . Heart attack Mother   . Stroke Mother   . Heart disease Father   . Colon cancer Neg Hx   . Stomach cancer Neg Hx   . Pancreatic cancer Neg Hx   . Esophageal cancer Neg Hx     Social History:  reports that he has quit smoking. His smoking use included Cigarettes. He has never used smokeless tobacco. He reports that he does not drink alcohol or use drugs.   Review of Systems: Constitutional:  denies fever, chills, diaphoresis, appetite change and fatigue.  HEENT: denies photophobia, eye pain, redness, hearing loss, ear pain, congestion, sore throat, rhinorrhea, sneezing, neck pain, neck stiffness and tinnitus.  Respiratory: denies SOB, DOE, cough, chest tightness, and wheezing.  Cardiovascular: denies chest pain, palpitations and leg swelling.  Gastrointestinal: denies nausea, vomiting, abdominal pain, diarrhea, constipation, blood in stool.  Genitourinary: denies dysuria, urgency, frequency, hematuria, flank pain and difficulty urinating.  Musculoskeletal: denies  myalgias, back pain, joint swelling, arthralgias and gait problem.   Skin: admits to abrasions from his fall,   slightl oozing from abrasions   Neurological: denies dizziness,  seizures, syncope, weakness, light-headedness, numbness and headaches.   Hematological: denies adenopathy, easy bruising, personal or family bleeding history.  Psychiatric/ Behavioral: denies suicidal ideation, mood changes, confusion, nervousness, sleep disturbance and agitation.    Physical Exam: BP 112/91 (BP Location: Left Arm)   Pulse 94   Temp 97.2 F (36.2 C)  (Oral)   Resp 20   Ht _0  (1.575 m)   Wt 125.6 kg (277 lb)   SpO2 95%   BMI 50.66 kg/m   Wt Readings from Last 3 Encounters:  03/25/16 125.6 kg (277 lb)  03/23/16 125.6 kg (277 lb)  03/22/16 125.6 kg (277 lb)    General: Obese black male.   In NAD   Head: Normocephalic, atraumatic, sclera anicteric,   Neck: Supple. Negative for carotid bruits. No JVD   Lungs:  Few wheezes bilaterally   Heart: RRR with S1 S2. No murmurs, rubs, or gallops .  Abdomen/ GI :  Morbidly obese   MSK: S/p bilateral BKA, trace edema bilaterally .   Multiple abrasions on legs with oozing from the abrasions   Extremities: S/p BKA bilaterally   Neurologic:  CN are grossly intact,  No obvious motor or sensory defect.  Alert and oriented X 3. Moves all extremities spontaneously.  Psych: Responds to questions appropriately with a normal affect.     Lab results: Basic Metabolic Panel:  Recent Labs Lab 03/25/16 0605  NA 136  K 4.6  CL 101  CO2 28  GLUCOSE 161*  BUN 21*  CREATININE 1.46*  CALCIUM 8.4*    Liver Function Tests: No results for input(s): AST, ALT, ALKPHOS, BILITOT, PROT, ALBUMIN in the last 168 hours. No results for input(s): LIPASE, AMYLASE in the last 168 hours. No results for input(s): AMMONIA in the last 168 hours.  CBC:  Recent Labs Lab 03/25/16 0605  WBC 6.3  NEUTROABS 2.9  HGB 14.1  HCT 42.8  MCV 89.0  PLT 183    Cardiac Enzymes: No results for input(s): CKTOTAL, CKMB, CKMBINDEX, TROPONINI in the last 168 hours.  BNP: Invalid input(s): POCBNP  CBG: No results for input(s): GLUCAP in the last 168 hours.  Coagulation Studies: No results for input(s): LABPROT, INR in the last 72 hours.   Other results:  Personal review of EKG shows :  -  NSR , IVCD    Imaging: Dg Chest Portable 1 View  Result Date: 03/25/2016 CLINICAL DATA:  Shortness of breath. Bilateral lower extremity edema. EXAM: PORTABLE CHEST 1 VIEW COMPARISON:  02/22/2016 FINDINGS: Sequelae of  prior CABG are again identified. The cardiac silhouette remains enlarged. There is new pulmonary vascular congestion with indistinctness of the pulmonary vasculature and with bilateral perihilar and bibasilar interstitial and hazy airspace opacities. Denser airspace opacity is present in the left lower lobe, and there are likely small bilateral pleural effusions. No pneumothorax is seen. No acute osseous abnormality is identified. IMPRESSION: Cardiomegaly with new small bilateral pleural effusions and bibasilar airspace opacities compatible with edema. Superimposed infection not excluded in the left lower lobe. Electronically Signed   By: Logan Bores M.D.   On: 03/25/2016 07:00   Dg Knee Complete 4 Views Left  Result Date: 03/23/2016 CLINICAL DATA:  Fall out of wheelchair today. Left knee pain. Initial encounter. EXAM: LEFT KNEE - COMPLETE 4+ VIEW COMPARISON:  None. FINDINGS: No evidence of fracture, dislocation, or joint effusion. No evidence of arthropathy or other focal bone abnormality. Previous below-the-knee amputation, without evidence of osteolysis or periostitis. Prepatellar and infrapatellar soft  tissue swelling noted. Peripheral vascular calcification also demonstrated. IMPRESSION: Prepatellar and infrapatellar soft tissue swelling. No evidence of acute fracture or dislocation. Below-the-knee amputation and peripheral vascular disease. Electronically Signed   By: Earle Gell M.D.   On: 03/23/2016 16:05   Dg Knee Complete 4 Views Right  Result Date: 03/23/2016 CLINICAL DATA:  [place] out of wheelchair today. Right knee pain. Initial encounter. EXAM: RIGHT KNEE - COMPLETE 4+ VIEW COMPARISON:  02/22/2014 FINDINGS: No evidence of fracture, dislocation, or joint effusion. Moderate tricompartmental osteoarthritis. Previous below-the-knee amputation is stable in appearance. No evidence of osteolysis or periostitis. Prepatellar soft tissue swelling noted. Peripheral vascular calcification also  demonstrated. IMPRESSION: Prepatellar soft tissue swelling. No evidence of fracture or dislocation. Moderate tricompartmental osteoarthritis. Stable appearance of below-the-knee amputation. Electronically Signed   By: Earle Gell M.D.   On: 03/23/2016 16:03         Assessment & Plan:   1. Chronic systolic congestive heart failure: The ejection fraction is between 10 and 15%. He has severe coronary artery disease and is not a candidate for further revascularization. He had PTCA last month with a very marginal result.  Fortunately he's not having any significant angina.  We will add Lasix.   he's receiving IV Lasix in the ER. At this point I would continue with his other medications. The plan is to recheck an echo cardiogram in several months to see if his LV function has improved.  Continue losartan and metoprolol.  2. Coronary artery disease-status post coronary artery bypass grafting: He had PTCA of his left anterior descending artery in November. The result was mediocre at best. He has very few options to improve his coronary flow at this time.       Thayer Headings, Brooke Bonito., MD, [facility] 03/25/2016, 8:30 AM Office - [phone] Pager [phone]

## 2016-03-25 NOTE — ED Notes (Signed)
We will obtain a stepdown bed shortly.

## 2016-03-25 NOTE — H&P (Addendum)
History and Physical    Leroy [name] GQB:[id number] DOB: 06-Nov-1945 DOA: 03/25/2016  PCP: [name], MD   Patient coming from: Home  Chief Complaint: Edema, shortness of breath  HPI: Leroy [name] is a 71 y.o. male with medical history significant of systolic CHF (last Echo 1 month prior showed EF of 10-15%), recent NSTEMI, atrial fibrillation, CAD with bypass graft, bilateral BKA who presents to the ED with bilateral knee swelling, skin weeping and wheezing.  Patient was previously admitted in November with NSTEMI and during that hospitalization underwent a Cardiac cath per Dr. Irish Lack with occluded LIMA graft to LAD. The native LAD was also occluded at the ostium. The native LAD was opened with balloon angioplasty. Patent vein graft to several OM branches. The native RCA is occluded and the vein graft to the RCA is occluded (known from last cath in 2011).  Since that admission patient has been seen by cardiology as well as by his PCP.  He was seen at the cardiologist office on 12/15 and was instructed to increase his metoprolol given his tachycardia and HTN.   Two days ago patient presented first to his PCP for evaluation of cough and wheezing (wheezing though is chronic for him and he has had it for years).  He was given a prescription for a Zpack, albuterol and tessalon perles.  Later that day he presented to the ED for a fall, knee pain and shortness of breath. He was evaluated and xrays of knees bilaterally showed no fracture.   Patient states after being evaluated in the ED 2 days ago he noticed the skin on the inner part of his thighs was starting to open and ooze.  He mentions that the drainage has not been terrible but enough that he has noticed.  He is still having a cough that is occasionally productive. He denies shortness of breath, chest pain, fever, diarrhea, and weight gain.  He says he weighs himself daily however he could not tell me what his weight is.  He is having wheezing  but as previously mentioned this is chronic for him. Of note, patient weight at discharge on 11/19 was 127.1kg and today his weight is entered as 125.6kg.  ED Course: patient seen by EDP.  BNP of 1098.2, Chest xray of Cardiomegaly with new small bilateral pleural effusions and bibasilar airspace opacities compatible with edema. Superimposed infection not excluded in the left lower lobe. IV Lasix of 32m IV x 1 given. Cardiology consulted by EDP- will see patient.  Creatinine of 1.46 (baseline appears to be 1.05 last month prior to hospitalization for catheterization).   Review of Systems: As per HPI otherwise 10 point review of systems negative.    Past Medical History:  Diagnosis Date  . [place]   . Cataract   . Chronic kidney disease    on Lisinopril to protect kidneys d/t being on Metformin per pt  . Chronic systolic CHF (congestive heart failure) ([place]   . Coronary artery disease    a. s/p remote CABG 2001 at [facility] b. Cath 03/2010: for med rx.;  c.  [place] Adam[place]oview (1/14):  Large Inferior apical MI, very small area of peri-infarct ischemia toward the inferior apical segment. EF 19%.  Med Rx was continued.    . Diabetes mellitus 1979   takes Metformni daily  and Lantus 50units bid  . DVT (deep venous thrombosis) ([place] 2011   legs  . H/O hiatal hernia   . H/O medication noncompliance  Due to insurance issues  . History of blood clots    in legs--this was in 2011--has been off of Coumadin 48month;takes Xarelto daily  . Hyperlipidemia   . Hypotension   . Ischemic cardiomyopathy    a. EF 40% 2010. b. Echo (2/14):  mild LVH, EF 30-35%, diff HK, Gr 1 DD, MAC, mild MR, mod LAE, PASP 39  . Joint pain   . Myocardial infarction    total of 8 heart attacks;last one in 2011  . Paroxysmal atrial fibrillation (HCC)   . Peripheral neuropathy ([place]   . Peripheral vascular disease ([place]    a. s/p L BKA 2014.  .Marland KitchenShortness of breath dyspnea   . Stroke ([facility]     Past Surgical  History:  Procedure Laterality Date  . ABDOMINAL AORTAGRAM  04-30-12  . ABDOMINAL AORTAGRAM N/A 04/30/2012   Procedure: ABDOMINAL AMaxcine Ham  Surgeon: BConrad [place] MD;  [facility];  Service: Cardiovascular;  Laterality: N/A;  . ADENOIDECTOMY    . AMPUTATION Left 06/03/2012   Procedure: AMPUTATION BELOW KNEE;  Surgeon: BConrad [place] MD;  [place]  Service: Vascular;  Laterality: Left;  . AMPUTATION Right 04/14/2013   Procedure: AMPUTATION BELOW KNEE ;  Surgeon: BConrad Quinlan MD;  [place]  Service: Vascular;  Laterality: Right;  . APPLICATION OF WOUND VAC Right 04/20/2014   Procedure: APPLICATION OF WOUND VAC;  Surgeon: BConrad [place] MD;  [place]  Service: Vascular;  Laterality: Right;  . CARDIAC CATHETERIZATION  03/19/10  . CARDIAC CATHETERIZATION N/A 02/22/2016   Procedure: Left Heart Cath and Cors/Grafts Angiography;  Surgeon: JJettie Booze MD;  [place];  Service: Cardiovascular;  Laterality: N/A;  . CARDIAC CATHETERIZATION N/A 02/22/2016   Procedure: Coronary Stent Intervention;  Surgeon: JJettie Booze MD;  [place];  Service: Cardiovascular;  Laterality: N/A;  . CARDIAC CATHETERIZATION    . COLONOSCOPY WITH PROPOFOL N/A 10/24/2015   Procedure: COLONOSCOPY WITH PROPOFOL;  Surgeon: DMilus Banister MD;  [facility];  Service: Endoscopy;  Laterality: N/A;  . CORONARY ANGIOPLASTY  2002   3 stents  . CORONARY ARTERY BYPASS GRAFT  03/2000   quadruple  . ENDARTERECTOMY FEMORAL Right 03/25/2013   Procedure: ENDARTERECTOMY FEMORAL ;  Surgeon: BConrad [place] MD;  [place]  Service: Vascular;  Laterality: Right;  . EYE SURGERY Right    Catarct  . HERNIA REPAIR     Hiatal Hernia  . I&D EXTREMITY Right 04/14/2013   Procedure: IRRIGATION AND DEBRIDEMENT OF RIGHT  GROIN & PLACEMENT OF VAC DRESSING;  Surgeon: BConrad [place] MD;  [place]  Service: Vascular;  Laterality: Right;  . I&D  EXTREMITY Right 04/20/2014   Procedure: DEBRIDEMENT OF RIGHT BELOW KNEE AMPUTATION STUMP;  Surgeon: BConrad Sandia MD;  [place]  Service: Vascular;  Laterality: Right;  . LOWER EXTREMITY ANGIOGRAM Bilateral 04/30/2012   Procedure: LOWER EXTREMITY ANGIOGRAM;  Surgeon: BConrad Morrill MD;  [facility];  Service: Cardiovascular;  Laterality: Bilateral;  bilat lower extrem angio  . PATCH ANGIOPLASTY Right 03/25/2013   Procedure: PATCH ANGIOPLASTY USING VASCU-GUARD PERIPHERAL VASCULAR PATCH;  Surgeon: BConrad [place] MD;  [place]  Service: Vascular;  Laterality: Right;  . revasculariztion  2011/2010   x 2   . TONSILLECTOMY    . WOUND DEBRIDEMENT Right 04/20/2014   s/p  rt bka  with application of wound  vac     reports that he has quit smoking. His smoking use included Cigarettes. He has never used smokeless tobacco. He reports that he does not drink alcohol or use drugs. Quit smoking 35 years ago. Lives alone in house- independent for ADL's.  Allergies  Allergen Reactions  . Statins Other (See Comments)    Reaction:  Muscle pain   . Amlodipine Besylate Other (See Comments)    Reaction:  Muscle pain   . Cephalexin Hives    Family History  Problem Relation Age of Onset  . Heart disease Mother     before age 53  . Diabetes Mother   . Heart attack Mother   . Stroke Mother   . Heart disease Father   . Colon cancer Neg Hx   . Stomach cancer Neg Hx   . Pancreatic cancer Neg Hx   . Esophageal cancer Neg Hx      Prior to Admission medications   Medication Sig Start Date End Date Taking? Authorizing Provider  albuterol (PROVENTIL HFA;VENTOLIN HFA) 108 (90 Base) MCG/ACT inhaler Inhale 2 puffs into the lungs every 6 (six) hours as needed for wheezing or shortness of breath. 03/23/16  Yes Debbrah Alar, NP  azithromycin (ZITHROMAX Z-PAK) 250 MG tablet 2 tabs by mouth today, then one tab once daily for 4 more days 03/23/16  Yes Debbrah Alar, NP  benzonatate  (TESSALON) 100 MG capsule Take 1 capsule (100 mg total) by mouth 3 (three) times daily as needed. Patient taking differently: Take 100 mg by mouth 3 (three) times daily as needed for cough.  03/23/16  Yes Debbrah Alar, NP  Blood Glucose Monitoring Suppl (PRODIGY AUTOCODE BLOOD GLUCOSE) w/Device KIT 1 kit 03/23/16  Yes Debbrah Alar, NP  clopidogrel (PLAVIX) 75 MG tablet Take 1 tablet (75 mg total) by mouth daily. 02/25/16  Yes Clanford Marisa Hua, MD  glucose blood test strip Test 3 times daily. 03/23/16  Yes Debbrah Alar, NP  insulin aspart (NOVOLOG) 100 UNIT/ML injection Inject 10 Units into the skin 3 (three) times daily as needed for high blood sugar (BLOOD SUGARS GREATER THAN 200).    Yes Historical Provider, MD  Insulin Glargine (LANTUS) 100 UNIT/ML Solostar Pen Inject 50 Units into the skin 2 (two) times daily. 01/05/16  Yes Janith Lima, MD  Insulin Pen Needle (B-D UF III MINI PEN NEEDLES) 31G X 5 MM MISC Use to inject insulin as directed. 11/27/15  Yes Janith Lima, MD  lisinopril (PRINIVIL,ZESTRIL) 5 MG tablet Take 5 mg by mouth daily.   Yes Historical Provider, MD  losartan (COZAAR) 25 MG tablet Take 1 tablet (25 mg total) by mouth daily. 02/26/16  Yes Clanford Marisa Hua, MD  metoprolol succinate (TOPROL-XL) 25 MG 24 hr tablet Take 1 tablet (25 mg total) by mouth 2 (two) times daily. Take with 50 mg dose to equal (75 mg) twice daily 03/22/16  Yes Brittainy Erie Noe, PA-C  metoprolol succinate (TOPROL-XL) 50 MG 24 hr tablet Take 1 tablet (50 mg total) by mouth 2 (two) times daily. Take with or immediately following a meal. 03/22/16 06/20/16 Yes Brittainy Erie Noe, PA-C  nitroGLYCERIN (NITROSTAT) 0.4 MG SL tablet Place 1 tablet (0.4 mg total) under the tongue every 5 (five) minutes as needed for chest pain. 05/07/13  Yes Daniel J Angiulli, PA-C  rivaroxaban (XARELTO) 20 MG TABS tablet Take 1 tablet (20 mg total) by mouth daily at 6 PM. 02/25/16  Yes Clanford Marisa Hua, MD    rosuvastatin (  CRESTOR) 20 MG tablet Take 20 mg by mouth daily.   Yes Historical Provider, MD  ezetimibe (ZETIA) 10 MG tablet Take 1 tablet (10 mg total) by mouth daily. Patient not taking: Reported on 03/25/2016 05/07/13   Cathlyn Parsons, PA-C    Physical Exam: Vitals:   03/25/16 7847 03/25/16 0703 03/25/16 0732 03/25/16 0802  BP: 101/90 101/62  112/91  Pulse: 90 84  94  Resp: _0 Temp:  97.2 F (36.2 C)    TempSrc:  Oral    SpO2: 100% 94% 100% 95%  Weight:      Height:          Constitutional: NAD, calm, comfortable Vitals:   03/25/16 0608 03/25/16 0703 03/25/16 0732 03/25/16 0802  BP: 101/90 101/62  112/91  Pulse: 90 84  94  Resp: _1 Temp:  97.2 F (36.2 C)    TempSrc:  Oral    SpO2: 100% 94% 100% 95%  Weight:      Height:       Eyes: PERRL, lids and conjunctivae normal ENMT: Mucous membranes are moist. Could not examine Posterior pharynx secondary to patient wearing CPAP mask. Dentition appears normal  Neck: normal, supple, no masses, no thyromegaly Respiratory: rhonchi and rales throughout.  Prolonged expiratory phase, no increased working of breathing, patient wearing CPAP  Cardiovascular: Regular rate and rhythm, no murmurs / rubs / gallops. No carotid bruits. Edema of BKA site bilaterally with appreciable weeping  Abdomen: obese, no tenderness, no masses palpated. No hepatosplenomegaly. Bowel sounds positive.  Musculoskeletal: no clubbing / cyanosis. No joint deformity upper extremities. Bilateral lower extremity BKA's.  Good ROM, no contractures. Normal muscle tone.  Skin: small open skin tears on the interior thighs bilaterally with drainage noted Neurologic: CN 2-12 grossly intact. Sensation intact, DTR normal.  Psychiatric: Normal judgment and insight. Alert and oriented x 3. Normal mood.    Labs on Admission: I have personally reviewed following labs and imaging studies  CBC:  Recent Labs Lab 03/25/16 0605  WBC 6.3  NEUTROABS 2.9   HGB 14.1  HCT 42.8  MCV 89.0  PLT 841   Basic Metabolic Panel:  Recent Labs Lab 03/25/16 0605  NA 136  K 4.6  CL 101  CO2 28  GLUCOSE 161*  BUN 21*  CREATININE 1.46*  CALCIUM 8.4*   GFR: Estimated Creatinine Clearance: 56.1 mL/min (by C-G formula based on SCr of 1.46 mg/dL (H)). Liver Function Tests: No results for input(s): AST, ALT, ALKPHOS, BILITOT, PROT, ALBUMIN in the last 168 hours. No results for input(s): LIPASE, AMYLASE in the last 168 hours. No results for input(s): AMMONIA in the last 168 hours. Coagulation Profile: No results for input(s): INR, PROTIME in the last 168 hours. Cardiac Enzymes: No results for input(s): CKTOTAL, CKMB, CKMBINDEX, TROPONINI in the last 168 hours. BNP (last 3 results) No results for input(s): PROBNP in the last 8760 hours. HbA1C: No results for input(s): HGBA1C in the last 72 hours. CBG: No results for input(s): GLUCAP in the last 168 hours. Lipid Profile: No results for input(s): CHOL, HDL, LDLCALC, TRIG, CHOLHDL, LDLDIRECT in the last 72 hours. Thyroid Function Tests: No results for input(s): TSH, T4TOTAL, FREET4, T3FREE, THYROIDAB in the last 72 hours. Anemia Panel: No results for input(s): VITAMINB12, FOLATE, FERRITIN, TIBC, IRON, RETICCTPCT in the last 72 hours. Urine analysis:    Component Value Date/Time   COLORURINE YELLOW 11/13/2015 [place] 11/13/2015 1148  LABSPEC <=1.005 (A) 11/13/2015 1148   PHURINE 6.0 11/13/2015 1148   GLUCOSEU NEGATIVE 11/13/2015 1148   HGBUR SMALL (A) 11/13/2015 1148   BILIRUBINUR NEGATIVE 11/13/2015 1148   KETONESUR NEGATIVE 11/13/2015 1148   PROTEINUR 100 (A) 10/22/2015 2336   UROBILINOGEN 0.2 11/13/2015 1148   NITRITE NEGATIVE 11/13/2015 1148   LEUKOCYTESUR NEGATIVE 11/13/2015 1148   Sepsis Labs: !!!!!!!!!!!!!!!!!!!!!!!!!!!!!!!!!!!!!!!!!!!! _0 (procalcitonin:4,lacticidven:4) )No results found for this or any previous visit (from the past 240 hour(s)).    Radiological Exams on Admission: Dg Chest Portable 1 View  Result Date: 03/25/2016 CLINICAL DATA:  Shortness of breath. Bilateral lower extremity edema. 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 The cardiac silhouette remains enlarged. There is new pulmonary vascular congestion with indistinctness of the pulmonary vasculature and with bilateral perihilar and bibasilar interstitial and hazy airspace opacities. Denser airspace opacity is present in the left lower lobe, and there are likely small bilateral pleural effusions. No pneumothorax is seen. No acute osseous abnormality is identified. IMPRESSION: Cardiomegaly with new small bilateral pleural effusions and bibasilar airspace opacities compatible with edema. Superimposed infection not excluded in the left lower lobe. Electronically Signed   By: Logan Bores M.D.   On: 03/25/2016 07:00   Dg Knee Complete 4 Views Left  Result Date: 03/23/2016 CLINICAL DATA:  Fall out of wheelchair today. Left knee pain. Initial encounter. EXAM: LEFT KNEE - COMPLETE 4+ VIEW COMPARISON:  None. FINDINGS: No evidence of fracture, dislocation, or joint effusion. No evidence of arthropathy or other focal bone abnormality. Previous below-the-knee amputation, without evidence of osteolysis or periostitis. Prepatellar and infrapatellar soft tissue swelling noted. Peripheral vascular calcification also demonstrated. IMPRESSION: Prepatellar and infrapatellar soft tissue swelling. No evidence of acute fracture or dislocation. Below-the-knee amputation and peripheral vascular disease. Electronically Signed   By: Earle Gell M.D.   On: 03/23/2016 16:05   Dg Knee Complete 4 Views Right  Result Date: 03/23/2016 CLINICAL DATA:  [place] out of wheelchair today. Right knee pain. Initial encounter. EXAM: RIGHT KNEE - COMPLETE 4+ VIEW COMPARISON:  02/22/2014 FINDINGS: No evidence of fracture, dislocation, or joint effusion.  Moderate tricompartmental osteoarthritis. Previous below-the-knee amputation is stable in appearance. No evidence of osteolysis or periostitis. Prepatellar soft tissue swelling noted. Peripheral vascular calcification also demonstrated. IMPRESSION: Prepatellar soft tissue swelling. No evidence of fracture or dislocation. Moderate tricompartmental osteoarthritis. Stable appearance of below-the-knee amputation. Electronically Signed   By: Earle Gell M.D.   On: 03/23/2016 16:03    EKG: Independently reviewed. Shows NSR with numerous PVC  Assessment/Plan Principal Problem:   Chronic systolic heart failure (HCC) Active Problems:   ATRIAL FIBRILLATION, PAROXYSMAL   S/P BKA (below knee amputation) bilateral (HCC)   S/P bilateral BKA (below knee amputation) ([place])   Diabetes mellitus type 2 with peripheral artery disease ([place])   Coronary artery disease involving coronary bypass graft of native heart with angina pectoris (HCC)   Acute CHF (congestive heart failure) ([place])    Chronic systolic heart Failure - last EF of 10-15% on Echo in November 2017 - Cardiology consulted - IV Lasix x 1 given in ED - Heart Failure pathway - will continue beta blocker and hold ACE given elevated creatinine - recheck BMP in am - strict I/O's as well as daily weights - monitor respiratory status - appreciate cardiology input  Atrial fibrillation - continue metoprolol - continue Xarelto and Plavix - telemetry  Diabetes Mellitus Type II with PAD - patient's med rec states  he last took his lantus last week - glucose today of 161 - home dose of lantus is 50units BID - will restart at 10units qhs and SSI - per note from PCP patient states his blood sugars were running in the 50s at home  CAD involving bypass graft - continue plavix - trend troponins - troponin of 0.08 today  Bronchitis - will continue azithromycin, tessalon and albuterol as needed  OSA - continue CPAP at night (and as  needed)  Bilateral BKA with recent fall - consult PT/OT  AKI - creatinine from prior to last month was 1.0 - today at 1.4 - will repeat BMP in am    DVT prophylaxis: lovenox  Code Status:  Full Code  Family Communication:  No family bedside Disposition Plan: Admit to stepdown as inpatient as patient will likely require more than 2 midnights to diurese and establish new medication routine Consults called: Cardiology called by EDP  Admission status: Inpatient- telemetry    Loretha Stapler MD [facility] Pager [phone]  If 7PM-7AM, please contact night-coverage [email or web address] Password [facility]  03/25/2016, 8:47 AM

## 2016-03-25 NOTE — ED Notes (Signed)
As he was being examined by the P.A., he took the bipap off. I have had R.T. Come and evaluate him, and th edecision has been made to allow him to go without bipap unless and until he goes to sleep.

## 2016-03-25 NOTE — Progress Notes (Signed)
IV Nurse attempted to place IV but was unsuccessful.  Was told she would notify MD that no IV was able to be placed.    Heloise PurpuraSusan Meera Vasco RN

## 2016-03-26 ENCOUNTER — Telehealth: Payer: Self-pay | Admitting: *Deleted

## 2016-03-26 DIAGNOSIS — J810 Acute pulmonary edema: Secondary | ICD-10-CM

## 2016-03-26 DIAGNOSIS — I5043 Acute on chronic combined systolic (congestive) and diastolic (congestive) heart failure: Secondary | ICD-10-CM

## 2016-03-26 DIAGNOSIS — N179 Acute kidney failure, unspecified: Secondary | ICD-10-CM

## 2016-03-26 LAB — GLUCOSE, CAPILLARY
GLUCOSE-CAPILLARY: 137 mg/dL — AB (ref 65–99)
GLUCOSE-CAPILLARY: 136 mg/dL — AB (ref 65–99)
Glucose-Capillary: 95 mg/dL (ref 65–99)
Glucose-Capillary: 190 mg/dL — ABNORMAL HIGH (ref 65–99)

## 2016-03-26 LAB — BASIC METABOLIC PANEL
ANION GAP: 8 (ref 5–15)
BUN: 24 mg/dL — ABNORMAL HIGH (ref 6–20)
CALCIUM: 8.4 mg/dL — AB (ref 8.9–10.3)
CO2: 24 mmol/L (ref 22–32)
Chloride: 104 mmol/L (ref 101–111)
Creatinine, Ser: 1.46 mg/dL — ABNORMAL HIGH (ref 0.61–1.24)
GFR, EST AFRICAN AMERICAN: 55 mL/min — AB (ref >60)
GFR, EST NON AFRICAN AMERICAN: 47 mL/min — AB (ref >60)
GLUCOSE: 146 mg/dL — AB (ref 65–99)
POTASSIUM: 4.7 mmol/L (ref 3.5–5.1)
Sodium: 136 mmol/L (ref 135–145)

## 2016-03-26 LAB — TROPONIN I: Troponin I: 0.08 ng/mL (ref <0.03)

## 2016-03-26 MED ORDER — FUROSEMIDE 40 MG PO TABS
80.0000 mg | ORAL_TABLET | Freq: Two times a day (BID) | ORAL | Status: DC
  Administered 2016-03-26 – 2016-03-27 (×2): 80 mg via ORAL
  Filled 2016-03-26 (×2): qty 2

## 2016-03-26 MED ORDER — MUPIROCIN 2 % EX OINT
1.0000 "application " | TOPICAL_OINTMENT | Freq: Two times a day (BID) | CUTANEOUS | Status: AC
  Administered 2016-03-26 – 2016-03-30 (×10): 1 via NASAL
  Filled 2016-03-26 (×3): qty 22

## 2016-03-26 MED ORDER — ACCU-CHEK SOFTCLIX LANCETS MISC: 3 refills | Status: DC

## 2016-03-26 MED ORDER — GLUCOSE BLOOD VI STRP: ORAL_STRIP | 1 refills | Status: DC

## 2016-03-26 MED ORDER — CHLORHEXIDINE GLUCONATE CLOTH 2 % EX PADS
6.0000 | MEDICATED_PAD | Freq: Every day | CUTANEOUS | Status: AC
  Administered 2016-03-26 – 2016-03-30 (×4): 6 via TOPICAL

## 2016-03-26 MED ORDER — ACCU-CHEK AVIVA PLUS W/DEVICE KIT: PACK | 0 refills | Status: DC

## 2016-03-26 NOTE — Telephone Encounter (Signed)
Received fax from [facility] that prodigy glucometer and test strips are not covered under pt's insurance and to call [phone] to determine which meter is covered then send new Rxs. Was unable to get representative on phone. Did internet search and it appears that accuchek aviva plus are on formulary. Rxs sent.

## 2016-03-26 NOTE — Progress Notes (Signed)
OT Cancellation Note  Patient Details Name: Leroy GriffithsWilliam J Voorhees MRN: [id number] DOB: [birth year]   Cancelled Treatment:    Reason Eval/Treat Not Completed: Fatigue/lethargy limiting ability to participate. Will attempt tomorrow.   [facility] CenterWARD,HILLARY  Meeyah Ovitt, OT/L  [phone] 03/26/2016 03/26/2016, 1:22 PM

## 2016-03-26 NOTE — Progress Notes (Signed)
PROGRESS NOTE                                                                                                                                                                                                             Patient Demographics:    Leroy GlasgowWilliam [name], is a 70 y.o. male, DOB - 02-Sep-1945, ZOX:[id number]  Admit date - 03/25/2016   Admitting Physician Filbert SchilderAlexandria U Kadolph, MD  Outpatient Primary MD for the patient is [name], MD  LOS - 1  Outpatient Specialists: Dr Eldridge Dacevaranasi  Chief Complaint  Patient presents with  . Leg Swelling       Brief Narrative   70 year old male with history of severe cardiomyopathy (EF of 10-15%), recent NSTEMI with cardiac cath, A. fib, CAD with history of CABG, bilateral BKA presented to the ED with bilateral knee swelling with skin weeping from his right inner thigh and wheezing. Patient was hospitalized one month back for NSTEMI and underwent cardiac cath showing occluded LIMA graft to LAD, occluded native LAD at the ostium which was opened with balloon angioplasty. Native RCA as well as the vein graft to RCA was occluded. Patient seen recently in the office as a hospital follow-up and increased his metoprolol dose due to hypertension and tachycardia. Patient was seen by his PCP 2 days prior to admission for cough and wheezing and was given a persistent for a Z-Pak, albuterol and Tessalon Perles. He then presented to the ED later that day with a fall with pain in his legs and shortness of breath. X-rays were negative for any injury. Since his ED visit 2 days back he noticed oozing from his inner thighs. Patient denied any chest pain, fever, chills, dysuria or diarrhea. In the ED he was found to be wheezing, labs showing acute kidney injury (creatinine of 1.46), BNP of 1100, chest x-ray showing cardiomegaly with new small bilateral pleural effusion and bibasilar airspace opacities. Patient  given a dose of IV Lasix and admitted to stepdown unit. Cardiology consulted.    Subjective:   Patient stable overnight. Reports his breathing to be unchanged.   Assessment  & Plan :    Principal Problem: Acute on Chronic systolic heart failure (HCC) Will resume by mouth Lasix 80 mg twice a day (currently having issue with IV line). Switch to IV Lasix once IV line obtained. Monitor  strict I/O and daily weight. Continue albuterol nebs. Hold losartan given acute kidney injury. Continue metoprolol and statin. Transfer to telemetry.  Active Problems: Acute kidney injury Possibly hypoperfusion with CHF. Monitor with Lasix. Hold ARB for today.  Elevated troponins No anginal symptoms. Suspect demand ischemia with acute CHF. Monitor on telemetry.  Paroxysmal A. fib Rate controlled. Continue metoprolol and Xarelto.  Diabetes mellitus type 2 with peripheral artery disease (HCC) Status post bilateral BKA. Continue Lantus with sliding scale coverage.    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  ? Acute bronchitis On Z-Pak.        Code Status : full code  Family Communication  : none at bedside  Disposition Plan  : home possibly in next 24-48 hrs if breathing improved.  Barriers For Discharge : active symptoms  Consults  :  cardiology  Procedures  : none  DVT Prophylaxis  :  xarelto  Lab Results  Component Value Date   PLT 183 03/25/2016    Antibiotics  :    Anti-infectives    Start     Dose/Rate Route Frequency Ordered Stop   03/25/16 1100  azithromycin (ZITHROMAX) tablet 250 mg  Status:  Discontinued     250 mg Oral Daily 03/25/16 0954 03/25/16 1018   03/25/16 1100  azithromycin (ZITHROMAX) tablet 250 mg     250 mg Oral Daily 03/25/16 1018 03/29/16 0959        Objective:   Vitals:   03/26/16 0600 03/26/16 0800 03/26/16 0900 03/26/16 0956  BP: (!) 119/95   (!) 105/53   Pulse: 80  83   Resp: (!) 26  (!) 34   Temp:  97.8 F (36.6 C)    TempSrc:  Oral    SpO2: 99%  94% 92%  Weight:      Height:        Wt Readings from Last 3 Encounters:  03/26/16 130.4 kg (287 lb 7.7 oz)  03/23/16 125.6 kg (277 lb)  03/22/16 125.6 kg (277 lb)     Intake/Output Summary (Last 24 hours) at 03/26/16 1033 Last data filed at 03/26/16 1000  Gross per 24 hour  Intake              240 ml  Output              500 ml  Net             -260 ml     Physical Exam  ZOX:WRUEAVW male lying in bed, tachypneic, has audible wheezing  HEENT:  moist mucosa, supple neck Chest: Diminished breath sounds over bilateral lung bases, diffuse wheezing bilaterally  CVS:  S1 and S2 irregular, no murmurs or gallop  GI: soft, NT, ND, BS+ Musculoskeletal: Bilateral BKA, pitting edema with oozing from in the thighs      Data Review:    CBC  Recent Labs Lab 03/25/16 0605  WBC 6.3  HGB 14.1  HCT 42.8  PLT 183  MCV 89.0  MCH 29.3  MCHC 32.9  RDW 14.8  LYMPHSABS 2.5  MONOABS 0.9  EOSABS 0.1  BASOSABS 0.0    Chemistries   Recent Labs Lab 03/25/16 0605 03/26/16 0041  NA 136 136  K 4.6 4.7  CL 101 104  CO2 28 24  GLUCOSE 161* 146*  BUN 21* 24*  CREATININE 1.46* 1.46*  CALCIUM 8.4* 8.4*   ------------------------------------------------------------------------------------------------------------------ No results for input(s):  CHOL, HDL, LDLCALC, TRIG, CHOLHDL, LDLDIRECT in the last 72 hours.  Lab Results  Component Value Date   HGBA1C 10.6 (H) 02/22/2016   ------------------------------------------------------------------------------------------------------------------ No results for input(s): TSH, T4TOTAL, T3FREE, THYROIDAB in the last 72 hours.  Invalid input(s): FREET3 ------------------------------------------------------------------------------------------------------------------ No results for input(s): VITAMINB12, FOLATE, FERRITIN, TIBC,  IRON, RETICCTPCT in the last 72 hours.  Coagulation profile No results for input(s): INR, PROTIME in the last 168 hours.  No results for input(s): DDIMER in the last 72 hours.  Cardiac Enzymes  Recent Labs Lab 03/25/16 1320 03/25/16 1821 03/26/16 0041  TROPONINI 0.09* 0.09* 0.08*   ------------------------------------------------------------------------------------------------------------------    Component Value Date/Time   BNP 1,098.2 (H) 03/25/2016 1610    Inpatient Medications  Scheduled Meds: . azithromycin  250 mg Oral Daily  . Chlorhexidine Gluconate Cloth  6 each Topical Q0600  . clopidogrel  75 mg Oral Daily  . furosemide  20 mg Intravenous Once  . furosemide  80 mg Oral BID  . insulin aspart  0-15 Units Subcutaneous TID WC  . insulin aspart  0-5 Units Subcutaneous QHS  . insulin glargine  10 Units Subcutaneous QHS  . losartan  25 mg Oral Daily  . metoprolol succinate  75 mg Oral BID  . mupirocin ointment  1 application Nasal BID  . rivaroxaban  20 mg Oral Q supper  . rosuvastatin  20 mg Oral Daily  . sodium chloride flush  3 mL Intravenous Q12H   Continuous Infusions: PRN Meds:.sodium chloride, acetaminophen, albuterol, benzonatate, ondansetron (ZOFRAN) IV, sodium chloride flush  Micro Results Recent Results (from the past 240 hour(s))  MRSA PCR Screening     Status: Abnormal   Collection Time: 03/25/16  9:27 AM  Result Value Ref Range Status   MRSA by PCR POSITIVE (A) NEGATIVE Final    Comment:        The GeneXpert MRSA Assay (FDA approved for NASAL specimens only), is one component of a comprehensive MRSA colonization surveillance program. It is not intended to diagnose MRSA infection nor to guide or monitor treatment for MRSA infections. RESULT CALLED TO, READ BACK BY AND VERIFIED WITH: MAYES,S @ 1656 ON [id number] BY POTEAT,S     Radiology Reports Dg Chest Portable 1 View  Result Date: 03/25/2016 CLINICAL DATA:  Shortness of breath.  Bilateral lower extremity edema. 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 The cardiac silhouette remains enlarged. There is new pulmonary vascular congestion with indistinctness of the pulmonary vasculature and with bilateral perihilar and bibasilar interstitial and hazy airspace opacities. Denser airspace opacity is present in the left lower lobe, and there are likely small bilateral pleural effusions. No pneumothorax is seen. No acute osseous abnormality is identified. IMPRESSION: Cardiomegaly with new small bilateral pleural effusions and bibasilar airspace opacities compatible with edema. Superimposed infection not excluded in the left lower lobe. Electronically Signed   By: Sebastian Ache M.D.   On: 03/25/2016 07:00   Dg Knee Complete 4 Views Left  Result Date: 03/23/2016 CLINICAL DATA:  Fall out of wheelchair today. Left knee pain. Initial encounter. EXAM: LEFT KNEE - COMPLETE 4+ VIEW COMPARISON:  None. FINDINGS: No evidence of fracture, dislocation, or joint effusion. No evidence of arthropathy or other focal bone abnormality. Previous below-the-knee amputation, without evidence of osteolysis or periostitis. Prepatellar and infrapatellar soft tissue swelling noted. Peripheral vascular calcification also demonstrated. IMPRESSION: Prepatellar and infrapatellar soft tissue swelling. No evidence of acute fracture or dislocation. Below-the-knee amputation and  peripheral vascular disease. Electronically Signed   By: Myles Rosenthal M.D.   On: 03/23/2016 16:05   Dg Knee Complete 4 Views Right  Result Date: 03/23/2016 CLINICAL DATA:  Larey Seat out of wheelchair today. Right knee pain. Initial encounter. EXAM: RIGHT KNEE - COMPLETE 4+ VIEW COMPARISON:  02/22/2014 FINDINGS: No evidence of fracture, dislocation, or joint effusion. Moderate tricompartmental osteoarthritis. Previous below-the-knee amputation is stable in appearance. No evidence of osteolysis  or periostitis. Prepatellar soft tissue swelling noted. Peripheral vascular calcification also demonstrated. IMPRESSION: Prepatellar soft tissue swelling. No evidence of fracture or dislocation. Moderate tricompartmental osteoarthritis. Stable appearance of below-the-knee amputation. Electronically Signed   By: Myles Rosenthal M.D.   On: 03/23/2016 16:03    Time Spent in minutes  25   Eddie North M.D on 03/26/2016 at 10:33 AM  Between 7am to 7pm - Pager - [phone]  After 7pm go to [email or web address] - password [facility]  [facility] -  Office  [phone]

## 2016-03-26 NOTE — Evaluation (Signed)
Physical Therapy Evaluation Patient Details Name: Leroy GriffithsWilliam J [name] MRN: [id number] DOB: 1946/02/20 Today's Date: 03/26/2016   History of Present Illness  Leroy GriffithsWilliam J [name] is a 70 y.o. male with medical history significant of systolic CHF (last Echo 1 month prior showed EF of 10-15%), recent NSTEMI, atrial fibrillation, CAD with bypass graft, bilateral BKA who presents to the ED 12/18 with bilateral knee swelling, skin weeping and wheezing. Patient was seen in ED 12/6 after power chair ran off ramp and chair fell on the patient. He was sent home.  Patient was previously admitted in November with NSTEMI and during that hospitalization underwent a Cardiac cath   Clinical Impression  The patient was lethargic at times, dozing off. The patient was minimally participatory today and  Did not sit at the bed edge. Pt admitted with above diagnosis. Pt currently with functional limitations due to the deficits listed below (see PT Problem List).  Pt will benefit from skilled PT to increase their independence and safety with mobility to allow discharge to the venue listed below.       Follow Up Recommendations SNF;Supervision/Assistance - 24 hour    Equipment Recommendations  None recommended by PT    Recommendations for Other Services       Precautions / Restrictions Precautions Precautions: Fall Restrictions Weight Bearing Restrictions: No      Mobility  Bed Mobility Overal bed mobility: Needs Assistance             General bed mobility comments: patient used rails to sit upright o=in bed for a few seconds. Noted wheezing.  Transfers                 General transfer comment: patient was not willing to participate  in bed mobility, even with much encouragement. Patient asks'Who is paying for this?"  Ambulation/Gait                Stairs            Wheelchair Mobility    Modified Rankin (Stroke Patients Only)       Balance Overall balance assessment: Needs  assistance;History of Falls     Sitting balance - Comments: Sat in long sitting with UE support                                     Pertinent Vitals/Pain Pain Assessment: No/denies pain    Home Living Family/patient expects to be discharged to:: Private residence Living Arrangements: Alone Available Help at Discharge: Available PRN/intermittently;Friend(s) Type of Home: House Home Access: Ramped entrance     Home Layout: One level Home Equipment: Walker - 2 wheels;Shower seat;Wheelchair - power      Prior Function           Comments: Uses bilateral prostheses,electric w/c and walker to get around, independent in transfers,reports that his prosthetics do not fit due to edema since saturday.     Hand Dominance        Extremity/Trunk Assessment   Upper Extremity Assessment Upper Extremity Assessment: Defer to OT evaluation    Lower Extremity Assessment Lower Extremity Assessment: RLE deficits/detail;LLE deficits/detail RLE Deficits / Details: significant edema of residual limb is noted, he moves the legs about in the bed. LLE Deficits / Details: same as right    Cervical / Trunk Assessment Cervical / Trunk Assessment: Normal  Communication      Cognition Arousal/Alertness: Lethargic Behavior  During Therapy: WFL for tasks assessed/performed Overall Cognitive Status: Within Functional Limits for tasks assessed                 General Comments: the patient noted to doze and had  periods of apnea.     General Comments      Exercises     Assessment/Plan    PT Assessment Patient needs continued PT services  PT Problem List Decreased strength;Decreased range of motion;Decreased activity tolerance;Decreased balance;Decreased mobility;Decreased knowledge of precautions;Cardiopulmonary status limiting activity;Decreased safety awareness;Decreased knowledge of use of DME;Obesity          PT Treatment Interventions DME  instruction;Functional mobility training;Therapeutic activities;Therapeutic exercise;Patient/family education    PT Goals (Current goals can be found in the Care Plan section)  Acute Rehab PT Goals Patient Stated Goal: did not state PT Goal Formulation: With patient Time For Goal Achievement: 04/09/16 Potential to Achieve Goals: Fair    Frequency Min 3X/week   Barriers to discharge Decreased caregiver support      Co-evaluation               End of Session   Activity Tolerance: Patient limited by lethargy Patient left: in bed;with call bell/phone within reach Nurse Communication: Mobility status;Need for lift equipment         Time: 1157-2620 PT Time Calculation (min) (ACUTE ONLY): 14 min   Charges:   PT Evaluation $PT Eval Low Complexity: 1 Procedure     PT G Codes:        Rada Hay 03/26/2016, 11:25 AM Blanchard Kelch PT [phone]

## 2016-03-26 NOTE — Progress Notes (Signed)
Patient Name: Tina GriffithsWilliam J Potash Date of Encounter: 03/26/2016  [facility]   Hospital Problem List     Principal Problem:   Chronic systolic heart failure (HCC) Active Problems:   ATRIAL FIBRILLATION, PAROXYSMAL   S/P BKA (below knee amputation) bilateral (HCC)   S/P bilateral BKA (below knee amputation) (HCC)   Diabetes mellitus type 2 with peripheral artery disease (HCC)   Coronary artery disease involving coronary bypass graft of native heart with angina pectoris (HCC)   Acute CHF (congestive heart failure) (HCC)     Subjective     Inpatient Medications    Scheduled Meds: . azithromycin  250 mg Oral Daily  . Chlorhexidine Gluconate Cloth  6 each Topical Q0600  . clopidogrel  75 mg Oral Daily  . furosemide  20 mg Intravenous Once  . furosemide  80 mg Oral BID  . insulin aspart  0-15 Units Subcutaneous TID WC  . insulin aspart  0-5 Units Subcutaneous QHS  . insulin glargine  10 Units Subcutaneous QHS  . losartan  25 mg Oral Daily  . metoprolol succinate  75 mg Oral BID  . mupirocin ointment  1 application Nasal BID  . rivaroxaban  20 mg Oral Q supper  . rosuvastatin  20 mg Oral Daily  . sodium chloride flush  3 mL Intravenous Q12H   Continuous Infusions:  PRN Meds: sodium chloride, acetaminophen, albuterol, benzonatate, ondansetron (ZOFRAN) IV, sodium chloride flush   Vital Signs    Vitals:   03/26/16 0600 03/26/16 0800 03/26/16 0900 03/26/16 0956  BP: (!) 119/95  (!) 105/53   Pulse: 80  83   Resp: (!) 26  (!) 34   Temp:  97.8 F (36.6 C)    TempSrc:  Oral    SpO2: 99%  94% 92%  Weight:      Height:        Intake/Output Summary (Last 24 hours) at 03/26/16 1149 Last data filed at 03/26/16 1000  Gross per 24 hour  Intake              240 ml  Output              500 ml  Net             -260 ml   Filed Weights   03/25/16 0457 03/25/16 0954 03/26/16 0500  Weight: 125.6 kg (277 lb) 130.8 kg (288 lb 5.8 oz) 130.4 kg (287 lb 7.7 oz)      Physical Exam    GEN: Well nourished, well developed, in no acute distress.  HEENT: Grossly normal.  Neck: Supple, no JVD, carotid bruits, or masses. Cardiac: RRR, no murmurs, rubs, or gallops. No clubbing, cyanosis, edema.  Radials/DP/PT 2+ and equal bilaterally.  Respiratory:  Respirations regular and unlabored, clear to auscultation bilaterally. GI: Soft, nontender, nondistended, BS + x 4. MS: s/p bilat BKAs  Skin: warm and dry, no rash. Neuro:  Strength and sensation are intact. Psych: AAOx3.  Normal affect.  Labs    CBC  Recent Labs  03/25/16 0605  WBC 6.3  NEUTROABS 2.9  HGB 14.1  HCT 42.8  MCV 89.0  PLT 183   Basic Metabolic Panel  Recent Labs  03/25/16 0605 03/26/16 0041  NA 136 136  K 4.6 4.7  CL 101 104  CO2 28 24  GLUCOSE 161* 146*  BUN 21* 24*  CREATININE 1.46* 1.46*  CALCIUM 8.4* 8.4*   Liver Function Tests No results for input(s): AST,  ALT, ALKPHOS, BILITOT, PROT, ALBUMIN in the last 72 hours. No results for input(s): LIPASE, AMYLASE in the last 72 hours. Cardiac Enzymes  Recent Labs  03/25/16 1320 03/25/16 1821 03/26/16 0041  TROPONINI 0.09* 0.09* 0.08*   BNP Invalid input(s): POCBNP D-Dimer No results for input(s): DDIMER in the last 72 hours. Hemoglobin A1C No results for input(s): HGBA1C in the last 72 hours. Fasting Lipid Panel No results for input(s): CHOL, HDL, LDLCALC, TRIG, CHOLHDL, LDLDIRECT in the last 72 hours. Thyroid Function Tests No results for input(s): TSH, T4TOTAL, T3FREE, THYROIDAB in the last 72 hours.  Invalid input(s): FREET3  Telemetry      NSR - Personally Reviewed  ECG      NSR - Personally Reviewed  Radiology    Dg Chest Portable 1 View  Result Date: 03/25/2016 CLINICAL DATA:  Shortness of breath. Bilateral lower extremity edema. 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 The cardiac silhouette remains enlarged. There is new  pulmonary vascular congestion with indistinctness of the pulmonary vasculature and with bilateral perihilar and bibasilar interstitial and hazy airspace opacities. Denser airspace opacity is present in the left lower lobe, and there are likely small bilateral pleural effusions. No pneumothorax is seen. No acute osseous abnormality is identified. IMPRESSION: Cardiomegaly with new small bilateral pleural effusions and bibasilar airspace opacities compatible with edema. Superimposed infection not excluded in the left lower lobe. Electronically Signed   By: Sebastian Ache M.D.   On: 03/25/2016 07:00    Cardiac Studies    Patient Profile       Assessment & Plan    . Chronic systolic congestive heart failure: The ejection fraction is between 10 and 15%. He has severe coronary artery disease and is not a candidate for further revascularization. He had PTCA last month with a very marginal result.  Fortunately he's not having any significant angina.  He has diuresed some. He still has mild edema in legs.  Continue losartan and metoprolol.   2. Coronary artery disease-status post coronary artery bypass grafting: He had PTCA of his left anterior descending artery in November. The result was mediocre at best. He has very few options to improve his coronary flow at this time.  No new recs. Will sign off.    He has orders to go to tele Follow up with Dr. Antoine Poche.   Signed, Kristeen Miss, MD  03/26/2016, 11:49 AM

## 2016-03-27 DIAGNOSIS — I4891 Unspecified atrial fibrillation: Secondary | ICD-10-CM

## 2016-03-27 LAB — GLUCOSE, CAPILLARY
GLUCOSE-CAPILLARY: 153 mg/dL — AB (ref 65–99)
GLUCOSE-CAPILLARY: 195 mg/dL — AB (ref 65–99)
Glucose-Capillary: 161 mg/dL — ABNORMAL HIGH (ref 65–99)
Glucose-Capillary: 158 mg/dL — ABNORMAL HIGH (ref 65–99)

## 2016-03-27 LAB — BASIC METABOLIC PANEL
Anion gap: 9 (ref 5–15)
BUN: 27 mg/dL — ABNORMAL HIGH (ref 6–20)
CALCIUM: 8.2 mg/dL — AB (ref 8.9–10.3)
CO2: 23 mmol/L (ref 22–32)
CREATININE: 1.62 mg/dL — AB (ref 0.61–1.24)
Chloride: 103 mmol/L (ref 101–111)
GFR, EST AFRICAN AMERICAN: 48 mL/min — AB (ref >60)
GFR, EST NON AFRICAN AMERICAN: 42 mL/min — AB (ref >60)
Glucose, Bld: 199 mg/dL — ABNORMAL HIGH (ref 65–99)
Potassium: 4.2 mmol/L (ref 3.5–5.1)
SODIUM: 135 mmol/L (ref 135–145)

## 2016-03-27 MED ORDER — ALBUTEROL SULFATE (2.5 MG/3ML) 0.083% IN NEBU
2.5000 mg | INHALATION_SOLUTION | Freq: Three times a day (TID) | RESPIRATORY_TRACT | Status: DC
  Administered 2016-03-28: 2.5 mg via RESPIRATORY_TRACT
  Filled 2016-03-27: qty 3

## 2016-03-27 MED ORDER — SODIUM CHLORIDE 0.9% FLUSH
10.0000 mL | INTRAVENOUS | Status: DC | PRN
  Administered 2016-03-30 – 2016-03-31 (×2): 20 mL
  Administered 2016-04-02 (×2): 10 mL
  Filled 2016-03-27 (×4): qty 40

## 2016-03-27 MED ORDER — ALBUTEROL SULFATE (2.5 MG/3ML) 0.083% IN NEBU
2.5000 mg | INHALATION_SOLUTION | Freq: Four times a day (QID) | RESPIRATORY_TRACT | Status: DC
  Administered 2016-03-27: 2.5 mg via RESPIRATORY_TRACT
  Filled 2016-03-27: qty 3

## 2016-03-27 MED ORDER — SODIUM CHLORIDE 0.9% FLUSH
10.0000 mL | Freq: Two times a day (BID) | INTRAVENOUS | Status: DC
  Administered 2016-03-27 – 2016-04-01 (×4): 10 mL

## 2016-03-27 MED ORDER — METOPROLOL SUCCINATE ER 25 MG PO TB24
25.0000 mg | ORAL_TABLET | Freq: Two times a day (BID) | ORAL | Status: DC
  Administered 2016-03-27 – 2016-03-28 (×4): 25 mg via ORAL
  Filled 2016-03-27 (×5): qty 1

## 2016-03-27 MED ORDER — POTASSIUM CHLORIDE CRYS ER 20 MEQ PO TBCR
20.0000 meq | EXTENDED_RELEASE_TABLET | Freq: Every day | ORAL | Status: DC
  Administered 2016-03-28 – 2016-04-01 (×5): 20 meq via ORAL
  Filled 2016-03-27 (×5): qty 1

## 2016-03-27 MED ORDER — FUROSEMIDE 10 MG/ML IJ SOLN
40.0000 mg | Freq: Two times a day (BID) | INTRAMUSCULAR | Status: DC
  Administered 2016-03-27: 40 mg via INTRAVENOUS
  Filled 2016-03-27: qty 4

## 2016-03-27 MED ORDER — FUROSEMIDE 10 MG/ML IJ SOLN: 40.0000 mg | Freq: Two times a day (BID) | INTRAMUSCULAR | Status: DC

## 2016-03-27 NOTE — Progress Notes (Signed)
2 unsuccessful attempts by the IV team  in starting an IV, this shift. Day shift RN & IV team too were unable to get IV access. Pt.currently does not have IV access.  Pt. also refuses to wear CPAP.  SaO2 fluctuating b/w 88-92 on RA.

## 2016-03-27 NOTE — Progress Notes (Signed)
PROGRESS NOTE  Leroy [name]:[id number] DOB: 1945-07-13 DOA: 03/25/2016 PCP: [name], MD  Brief History:  70 year old male with history of severe cardiomyopathy (EF of 10-15%), recent NSTEMI with cardiac cath, A. fib, CAD with history of CABG, bilateral BKA presented to the ED with bilateral knee swelling with skin weeping from his right inner thigh and wheezing. Patient was hospitalized one month back for NSTEMI and underwent cardiac cath showing occluded LIMA graft to LAD, occluded native LAD at the ostium which was opened with balloon angioplasty. Native RCA as well as the vein graft to RCA was occluded. Patient seen recently in the office as a hospital follow-up and increased his metoprolol dose due to hypertension and tachycardia. Patient was seen by his PCP 2 days prior to admission for cough and wheezing and was given a persistent for a Z-Pak, albuterol and Tessalon Perles. He then presented to the ED later that day with a fall with pain in his legs and shortness of breath. X-rays were negative for any injury. There was difficulty obtaining IV access. Therefore, the patient was initially treated with oral furosemide. Cardiology was consulted, and PICC line was requested.  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  -Switch to IV Lasix once IV line obtained. Monitor strict I/O and daily weight. -Continue albuterol nebs.  -Hold losartan given acute kidney injury. -Decreased metoprolol dose due to soft/low blood pressures -03/22/2016 office weight 277 pounds -03/25/2016 admission weight 288 pounds -Place PICC line -Reconsult cardiology--concerned with patient's soft and low blood pressures and remains fluid overloaded may need milrinone  Acute respiratory failure with hypoxia -Secondary to decompensated CHF -Continue bronchodilators--patient has a degree  of bronchospasm -Presently stable on 3 L  Acute kidney injury Possibly hypoperfusion with CHF. Monitor with Lasix -Holding ARB -Suspect cardiorenal syndrome -Baseline creatinine 1.0-1.3  Elevated troponins No anginal symptoms. Suspect demand ischemia with acute CHF. Monitor on telemetry.  Paroxysmal A. fib Rate controlled. Continue metoprolol and Xarelto. -CHADSVASc =5  Diabetes mellitus type 2 with peripheral artery disease (HCC) Status post bilateral BKA. Continue Lantus with sliding scale coverage.    Coronary artery disease involving coronary bypass graft of native heart with angina pectoris (HCC) -Continue Plavix, beta blocker and statin.  ? Acute bronchitis On azithromycin     Disposition Plan:  Not stable for d/c; remain in stepdown Family Communication:   Family at bedside  Consultants:  Cardiology  Code Status:  FULL  DVT Prophylaxis: Xarelto   Procedures: As Listed in Progress Note Above  Antibiotics: None    Subjective: Patient still complains of shortness of breath, but states that it is a little bit better. Denies any fevers, chills, chest pain, nausea, vomiting, diarrhea, abdominal pain.  Objective: Vitals:   03/27/16 0100 03/27/16 0439 03/27/16 0500 03/27/16 0630  BP: 122/62  100/64 (!) 98/55  Pulse: 83   79  Resp: (!) 26   (!) 34  Temp:  99 F (37.2 C)    TempSrc:  Oral    SpO2: 91%   (!) 89%  Weight:   131.2 kg (289 lb 3.9 oz)   Height:        Intake/Output Summary (Last 24 hours) at 03/27/16 0756 Last data filed at 03/27/16 0500  Gross per 24 hour  Intake  760 ml  Output              650 ml  Net              110 ml   Weight change: 0.4 kg (14.1 oz) Exam:   General:  Pt is alert, follows commands appropriately, not in acute distress  HEENT: No icterus, No thrush, No neck mass, [place]/AT  Cardiovascular: RRR, S1/S2, no rubs, no gallops  Respiratory: Bibasilar crackles. Bibasilar wheeze.  Abdomen: Soft/+BS,  non tender, non distended, no guarding  Extremities: 2+ LE edema, No lymphangitis, No petechiae, No rashes, no synovitis   Data Reviewed: I have personally reviewed following labs and imaging studies Basic Metabolic Panel:  Recent Labs Lab 03/25/16 0605 03/26/16 0041 03/27/16 0350  NA 136 136 135  K 4.6 4.7 4.2  CL 101 104 103  CO2 GLUCOSE 161* 146* 199*  BUN 21* 24* 27*  CREATININE 1.46* 1.46* 1.62*  CALCIUM 8.4* 8.4* 8.2*   Liver Function Tests: No results for input(s): AST, ALT, ALKPHOS, BILITOT, PROT, ALBUMIN in the last 168 hours. No results for input(s): LIPASE, AMYLASE in the last 168 hours. No results for input(s): AMMONIA in the last 168 hours. Coagulation Profile: No results for input(s): INR, PROTIME in the last 168 hours. CBC:  Recent Labs Lab 03/25/16 0605  WBC 6.3  NEUTROABS 2.9  HGB 14.1  HCT 42.8  MCV 89.0  PLT 183   Cardiac Enzymes:  Recent Labs Lab 03/25/16 1320 03/25/16 1821 03/26/16 0041  TROPONINI 0.09* 0.09* 0.08*   BNP: Invalid input(s): POCBNP CBG:  Recent Labs Lab 03/25/16 2117 03/26/16 0741 03/26/16 1200 03/26/16 1534 03/26/16 2055  GLUCAP 225* 137* 136* 95 190*   HbA1C: No results for input(s): HGBA1C in the last 72 hours. Urine analysis:    Component Value Date/Time   COLORURINE YELLOW 11/13/2015 1148   APPEARANCEUR CLEAR 11/13/2015 1148   LABSPEC <=1.005 (A) 11/13/2015 1148   PHURINE 6.0 11/13/2015 1148   GLUCOSEU NEGATIVE 11/13/2015 1148   HGBUR SMALL (A) 11/13/2015 1148   BILIRUBINUR NEGATIVE 11/13/2015 1148   KETONESUR NEGATIVE 11/13/2015 1148   PROTEINUR 100 (A) 10/22/2015 2336   UROBILINOGEN 0.2 11/13/2015 1148   NITRITE NEGATIVE 11/13/2015 1148   LEUKOCYTESUR NEGATIVE 11/13/2015 1148   Sepsis Labs: (procalcitonin:4,lacticidven:4) ) Recent Results (from the past 240 hour(s))  MRSA PCR Screening     Status: Abnormal   Collection Time: 03/25/16  9:27 AM  Result Value Ref Range  Status   MRSA by PCR POSITIVE (A) NEGATIVE Final    Comment:        The GeneXpert MRSA Assay (FDA approved for NASAL specimens only), is one component of a comprehensive MRSA colonization surveillance program. It is not intended to diagnose MRSA infection nor to guide or monitor treatment for MRSA infections. RESULT CALLED TO, READ BACK BY AND VERIFIED WITH: MAYES,S @ 1656 ON [id number] BY POTEAT,S      Scheduled Meds: . azithromycin  250 mg Oral Daily  . Chlorhexidine Gluconate Cloth  6 each Topical Q0600  . clopidogrel  75 mg Oral Daily  . furosemide  20 mg Intravenous Once  . furosemide  80 mg Oral BID  . insulin aspart  0-15 Units Subcutaneous TID WC  . insulin aspart  0-5 Units Subcutaneous QHS  . insulin glargine  10 Units Subcutaneous QHS  . metoprolol succinate  75 mg Oral BID  . mupirocin ointment  1 application Nasal BID  .  rivaroxaban  20 mg Oral Q supper  . rosuvastatin  20 mg Oral Daily  . sodium chloride flush  3 mL Intravenous Q12H   Continuous Infusions:  Procedures/Studies: Dg Chest Portable 1 View  Result Date: 03/25/2016 CLINICAL DATA:  Shortness of breath. Bilateral lower extremity edema. 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 The cardiac silhouette remains enlarged. There is new pulmonary vascular congestion with indistinctness of the pulmonary vasculature and with bilateral perihilar and bibasilar interstitial and hazy airspace opacities. Denser airspace opacity is present in the left lower lobe, and there are likely small bilateral pleural effusions. No pneumothorax is seen. No acute osseous abnormality is identified. IMPRESSION: Cardiomegaly with new small bilateral pleural effusions and bibasilar airspace opacities compatible with edema. Superimposed infection not excluded in the left lower lobe. Electronically Signed   By: Sebastian Ache M.D.   On: 03/25/2016 07:00   Dg Knee Complete 4  Views Left  Result Date: 03/23/2016 CLINICAL DATA:  Fall out of wheelchair today. Left knee pain. Initial encounter. EXAM: LEFT KNEE - COMPLETE 4+ VIEW COMPARISON:  None. FINDINGS: No evidence of fracture, dislocation, or joint effusion. No evidence of arthropathy or other focal bone abnormality. Previous below-the-knee amputation, without evidence of osteolysis or periostitis. Prepatellar and infrapatellar soft tissue swelling noted. Peripheral vascular calcification also demonstrated. IMPRESSION: Prepatellar and infrapatellar soft tissue swelling. No evidence of acute fracture or dislocation. Below-the-knee amputation and peripheral vascular disease. Electronically Signed   By: Myles Rosenthal M.D.   On: 03/23/2016 16:05   Dg Knee Complete 4 Views Right  Result Date: 03/23/2016 CLINICAL DATA:  Larey Seat out of wheelchair today. Right knee pain. Initial encounter. EXAM: RIGHT KNEE - COMPLETE 4+ VIEW COMPARISON:  02/22/2014 FINDINGS: No evidence of fracture, dislocation, or joint effusion. Moderate tricompartmental osteoarthritis. Previous below-the-knee amputation is stable in appearance. No evidence of osteolysis or periostitis. Prepatellar soft tissue swelling noted. Peripheral vascular calcification also demonstrated. IMPRESSION: Prepatellar soft tissue swelling. No evidence of fracture or dislocation. Moderate tricompartmental osteoarthritis. Stable appearance of below-the-knee amputation. Electronically Signed   By: Myles Rosenthal M.D.   On: 03/23/2016 16:03    Brynnley Dayrit, DO  [facility] Pager [phone]  If 7PM-7AM, please contact night-coverage [email or web address] Password [email or web address] 03/27/2016, 7:56 AM   LOS: 2 days

## 2016-03-27 NOTE — Progress Notes (Signed)
Patient Name: Leroy [name] Date of Encounter: 03/27/2016  Primary Cardiologist:  [facility] Problem List     Principal Problem:   Chronic systolic heart failure (HCC) Active Problems:   ATRIAL FIBRILLATION, PAROXYSMAL   S/P BKA (below knee amputation) bilateral (HCC)   S/P bilateral BKA (below knee amputation) (HCC)   Diabetes mellitus type 2 with peripheral artery disease (HCC)   Coronary artery disease involving coronary bypass graft of native heart with angina pectoris (HCC)   Acute CHF (congestive heart failure) (HCC)   Acute on chronic combined systolic and diastolic heart failure (HCC)   Acute pulmonary edema (HCC)   Acute kidney injury (HCC)     Subjective   70 y.o. male with a PMHx of CHF - EF 10% , CHF, DM who was admitted to [facility] on 03/25/2016 for evaluation of a mechanical fall out of his wheelchair.   Pt has a hx of CAD , CABG cath recently( Nov. 16, 2017)  by Dr. Eldridge Dace showed occluded LIMA to LAD graft.   The native LAD was angioplastied and he had a marginal result.   The prox LAD was opened but still significantly diseased and there was no flow to the distal LAD  also has occluded RCA and occluded OM1 and OM2   Inpatient Medications    Scheduled Meds: . azithromycin  250 mg Oral Daily  . Chlorhexidine Gluconate Cloth  6 each Topical Q0600  . clopidogrel  75 mg Oral Daily  . furosemide  40 mg Intravenous BID  . insulin aspart  0-15 Units Subcutaneous TID WC  . insulin aspart  0-5 Units Subcutaneous QHS  . insulin glargine  10 Units Subcutaneous QHS  . metoprolol succinate  25 mg Oral BID  . mupirocin ointment  1 application Nasal BID  . rivaroxaban  20 mg Oral Q supper  . rosuvastatin  20 mg Oral Daily  . sodium chloride flush  10-40 mL Intracatheter Q12H  . sodium chloride flush  3 mL Intravenous Q12H   Continuous Infusions:  PRN Meds: sodium chloride, acetaminophen, albuterol, benzonatate, ondansetron (ZOFRAN) IV, sodium chloride  flush, sodium chloride flush   Vital Signs    Vitals:   03/27/16 1000 03/27/16 1100 03/27/16 1200 03/27/16 1232  BP: 108/86   131/78  Pulse: 95 85 (!) 59 87  Resp: 12 (!) 24 16 13   Temp:      TempSrc:      SpO2: 98% 98% 92% 99%  Weight:      Height:        Intake/Output Summary (Last 24 hours) at 03/27/16 1320 Last data filed at 03/27/16 1247  Gross per 24 hour  Intake              320 ml  Output              900 ml  Net             -580 ml   Filed Weights   03/25/16 0954 03/26/16 0500 03/27/16 0500  Weight: 288 lb 5.8 oz (130.8 kg) 287 lb 7.7 oz (130.4 kg) 289 lb 3.9 oz (131.2 kg)   BP 131/78 (BP Location: Left Arm)   Pulse 87   Temp 98.5 F (36.9 C) (Oral)   Resp 13   Ht 5\' 2"  (1.575 m)   Wt 289 lb 3.9 oz (131.2 kg)   SpO2 99%   BMI 52.90 kg/m   Physical Exam    GEN: obese  male, NAD   HEENT: Grossly normal.  Neck: Supple, no JVD, carotid bruits, or masses. Cardiac: RRR, no murmurs, rubs, or gallops. No clubbing, cyanosis, edema.  Radials/DP/PT 2+ and equal bilaterally.  Respiratory:  Few wheezes bilaterally .  GI: Soft, nontender, nondistended, BS + x 4. No significant stump edema  MS: s/p bilat BKAs  Skin: warm and dry, no rash. Neuro:  Strength and sensation are intact. Psych: AAOx3.  Normal affect.  Labs    CBC  Recent Labs  03/25/16 0605  WBC 6.3  NEUTROABS 2.9  HGB 14.1  HCT 42.8  MCV 89.0  PLT 183   Basic Metabolic Panel  Recent Labs  03/26/16 0041 03/27/16 0350  NA 136 135  K 4.7 4.2  CL 104 103  CO2 24 23  GLUCOSE 146* 199*  BUN 24* 27*  CREATININE 1.46* 1.62*  CALCIUM 8.4* 8.2*   Liver Function Tests No results for input(s): AST, ALT, ALKPHOS, BILITOT, PROT, ALBUMIN in the last 72 hours. No results for input(s): LIPASE, AMYLASE in the last 72 hours. Cardiac Enzymes  Recent Labs  03/25/16 1320 03/25/16 1821 03/26/16 0041  TROPONINI 0.09* 0.09* 0.08*   BNP Invalid input(s): POCBNP D-Dimer No results for input(s):  DDIMER in the last 72 hours. Hemoglobin A1C No results for input(s): HGBA1C in the last 72 hours. Fasting Lipid Panel No results for input(s): CHOL, HDL, LDLCALC, TRIG, CHOLHDL, LDLDIRECT in the last 72 hours. Thyroid Function Tests No results for input(s): TSH, T4TOTAL, T3FREE, THYROIDAB in the last 72 hours.  Invalid input(s): FREET3  Telemetry      NSR - Personally Reviewed  ECG      NSR - Personally Reviewed  Radiology    No results found.  Cardiac Studies    Patient Profile       Assessment & Plan    . Chronic systolic congestive heart failure: The ejection fraction is between 10 and 15%. He has severe coronary artery disease and is not a candidate for further revascularization. He had PTCA last month with a very marginal result.  Fortunately he's not having any significant angina.  He has diuresed some. The edema in his legs appears to be better  Stumps  are still large   Continue losartan and metoprolol. He now has a PICC line.   IV lasix has been started  2. Coronary artery disease-status post coronary artery bypass grafting: He had PTCA of his left anterior descending artery in November. The result was mediocre at best. He has very few options to improve his coronary flow at this time.  3. Wheezing:   Changed nebs to scheduled Q6 hr.   Signed, Kristeen MissPhilip Nahser, MD  03/27/2016, 1:20 PM

## 2016-03-27 NOTE — Progress Notes (Signed)
Peripherally Inserted Central Catheter/Midline Placement  The IV Nurse has discussed with the patient and/or persons authorized to consent for the patient, the purpose of this procedure and the potential benefits and risks involved with this procedure.  The benefits include less needle sticks, lab draws from the catheter, and the patient may be discharged home with the catheter. Risks include, but not limited to, infection, bleeding, blood clot (thrombus formation), and puncture of an artery; nerve damage and irregular heartbeat and possibility to perform a PICC exchange if needed/ordered by physician.  Alternatives to this procedure were also discussed.  Bard Power PICC patient education guide, fact sheet on infection prevention and patient information card has been provided to patient /or left at bedside.    PICC/Midline Placement Documentation        Leroy [name], Leroy [name] 03/27/2016, 11:41 AM

## 2016-03-27 NOTE — Progress Notes (Signed)
Pt agreable to wear Auto BIPAP at this time.  RT to monitor and assess as needed.

## 2016-03-27 NOTE — Progress Notes (Signed)
Pt is refusing to wear CPAP at this time.  Education provided but Pt is refusing to utilize at this time.  Pt said he will notify RT if he changes his mind, RN aware.  RT to monitor and assess as needed.

## 2016-03-28 LAB — BASIC METABOLIC PANEL
Anion gap: 7 (ref 5–15)
BUN: 30 mg/dL — AB (ref 6–20)
CALCIUM: 8.2 mg/dL — AB (ref 8.9–10.3)
CO2: 27 mmol/L (ref 22–32)
CREATININE: 1.62 mg/dL — AB (ref 0.61–1.24)
Chloride: 102 mmol/L (ref 101–111)
GFR, EST AFRICAN AMERICAN: 48 mL/min — AB (ref >60)
GFR, EST NON AFRICAN AMERICAN: 42 mL/min — AB (ref >60)
Glucose, Bld: 137 mg/dL — ABNORMAL HIGH (ref 65–99)
Potassium: 4.1 mmol/L (ref 3.5–5.1)
SODIUM: 136 mmol/L (ref 135–145)

## 2016-03-28 LAB — GLUCOSE, CAPILLARY
GLUCOSE-CAPILLARY: 187 mg/dL — AB (ref 65–99)
GLUCOSE-CAPILLARY: 225 mg/dL — AB (ref 65–99)
Glucose-Capillary: 178 mg/dL — ABNORMAL HIGH (ref 65–99)
Glucose-Capillary: 194 mg/dL — ABNORMAL HIGH (ref 65–99)

## 2016-03-28 MED ORDER — INSULIN GLARGINE 100 UNIT/ML ~~LOC~~ SOLN
15.0000 [IU] | Freq: Every day | SUBCUTANEOUS | Status: DC
  Administered 2016-03-28: 15 [IU] via SUBCUTANEOUS
  Filled 2016-03-28: qty 0.15

## 2016-03-28 MED ORDER — IPRATROPIUM-ALBUTEROL 0.5-2.5 (3) MG/3ML IN SOLN
3.0000 mL | Freq: Three times a day (TID) | RESPIRATORY_TRACT | Status: DC
Start: 2016-03-28 — End: 2016-03-29
  Administered 2016-03-28 – 2016-03-29 (×4): 3 mL via RESPIRATORY_TRACT
  Filled 2016-03-28 (×5): qty 3

## 2016-03-28 MED ORDER — FUROSEMIDE 10 MG/ML IJ SOLN
60.0000 mg | Freq: Two times a day (BID) | INTRAMUSCULAR | Status: DC
  Administered 2016-03-28 – 2016-03-30 (×5): 60 mg via INTRAVENOUS
  Filled 2016-03-28 (×5): qty 6

## 2016-03-28 NOTE — Clinical Social Work Note (Signed)
Clinical Social Work Assessment  Patient Details  Name: Leroy [name] MRN: [id number] Date of Birth: 1945/08/19  Date of referral:  03/28/16               Reason for consult:  Facility Placement                Permission sought to share information with:  Oceanographer granted to share information::  Yes, Verbal Permission Granted  Name::        Agency::     Relationship::     Contact Information:     Housing/Transportation Living arrangements for the past 2 months:  Single Family Home Source of Information:  Patient, Adult Children, Other (Comment Required) (patient's caretaker, [name] at bedside) Patient Interpreter Needed:  None Criminal Activity/Legal Involvement Pertinent to Current Situation/Hospitalization:  No - Comment as needed Significant Relationships:  Friend, Adult Children Lives with:  Self Do you feel safe going back to the place where you live?  No Need for family participation in patient care:  Yes (Comment)  Care giving concerns:  CSW reviewed PT evaluation recommended SNF.    Social Worker assessment / plan:  CSW spoke with patient, caretaker at bedside - [name] & step-daughter, [name] via phone re: discharge planning.   Employment status:  Retired Database administrator PT Recommendations:  Skilled Nursing Facility Information / Referral to community resources:  Skilled Nursing Facility  Patient/Family's Response to care:  Patient was hesitant to agree to SNF, though understands the need to go as he would be home alone. CSW sent information out to [facility] - daughter expressed interest in [facility] or [facility] - awaiting bed offers.   Patient/Family's Understanding of and Emotional Response to Diagnosis, Current Treatment, and Prognosis:  Patient's BP increased during conversation about going to rehab.   Emotional Assessment Appearance:  Appears stated  age Attitude/Demeanor/Rapport:    Affect (typically observed):    Orientation:  Oriented to Self, Oriented to Place, Oriented to  Time, Oriented to Situation Alcohol / Substance use:    Psych involvement (Current and /or in the community):     Discharge Needs  Concerns to be addressed:    Readmission within the last 30 days:    Current discharge risk:    Barriers to Discharge:      Arlyss Repress, LCSW 03/28/2016, 1:14 PM

## 2016-03-28 NOTE — Progress Notes (Signed)
Physical Therapy Treatment Patient Details Name: Leroy [name] MRN: [id number] DOB: 1945-06-28 Today's Date: 03/28/2016    History of Present Illness Leroy [name] is a 70 y.o. male with medical history significant of systolic CHF (last Echo 1 month prior showed EF of 10-15%), recent NSTEMI, atrial fibrillation, CAD with bypass graft, bilateral BKA who presents to the ED 12/18 with bilateral knee swelling, skin weeping and wheezing. Patient was seen in ED 12/6 after power chair ran off ramp and chair fell on the patient. He was sent home.  Patient was previously admitted in November with NSTEMI and during that hospitalization underwent a Cardiac cath     PT Comments    The patient states that he is not ready for moving, that he wants  The swelling taken care of. At times he was garbled speech and unable to fully express himself.  He  Is noted to be very SOB even when speaking. Am concerned for mobility/ transfer efforts to a chair will increase WOB. Prosthetics would not fit at this time as well as there are open areas  Around the residual limb. Continue as patient able.   Follow Up Recommendations  SNF;Supervision/Assistance - 24 hour     Equipment Recommendations  None recommended by PT    Recommendations for Other Services       Precautions / Restrictions Precautions Precautions: Fall Precaution Comments: SOB Restrictions Weight Bearing Restrictions: No    Mobility  Bed Mobility Overal bed mobility: Needs Assistance             General bed mobility comments: patient used rails to sit upright in bed for a few seconds. Noted wheezing. DOE, 4/4 even when talking.  Transfers                 General transfer comment: the patient was not  ready for mobility at this time  Ambulation/Gait                 Stairs            Wheelchair Mobility    Modified Rankin (Stroke Patients Only)       Balance       Sitting balance - Comments: Sat in  long sitting with UE support very briefly                            Cognition Arousal/Alertness: Awake/alert Behavior During Therapy: Restless;Agitated                   General Comments: the patient was not completing sentences, appeared to have some difficulty with word finding. He was somewhat agitated with therapy efforts to work on mobility. He was focused on getting rid of the swelling.     Exercises      General Comments        Pertinent Vitals/Pain Pain Assessment: No/denies pain    Home Living                      Prior Function            PT Goals (current goals can now be found in the care plan section) Progress towards PT goals: Progressing toward goals    Frequency    Min 3X/week      PT Plan Current plan remains appropriate    Co-evaluation             End  of Session   Activity Tolerance: Patient limited by fatigue;Treatment limited secondary to medical complications (Comment) Patient left: in bed;with call bell/phone within reach;with bed alarm set     Time: 1420-1440 PT Time Calculation (min) (ACUTE ONLY): 20 min  Charges:  $Therapeutic Activity: 8-22 mins                    G Codes:      Leroy [name], Leroy [name] 03/28/2016, 2:11 PM Leroy KelchKaren Layn [name] PT [phone]

## 2016-03-28 NOTE — Clinical Social Work Note (Signed)
Patient off the floor. MSW attempted to contact patient's dtr, Lelon Mast in regards to discharge planning- phone is not a working number.   MSW remains available as needed.   Derenda Fennel, MSW [phone] 03/28/2016 11:05 AM

## 2016-03-28 NOTE — Progress Notes (Signed)
Pt. refuses to wear [place] or CPAP, inspite of repeated attempts to educate him by RN and RT about the need for Oxygen. His SaO2 dips down to the 80's when he is asleep.

## 2016-03-28 NOTE — Clinical Social Work Placement (Signed)
   CLINICAL SOCIAL WORK PLACEMENT  NOTE  Date:  03/28/2016  Patient Details  Name: Leroy [name] MRN: [id number] Date of Birth: 26-Dec-1945  Clinical Social Work is seeking post-discharge placement for this patient at the Skilled  Nursing Facility level of care (*CSW will initial, date and re-position this form in  chart as items are completed):  Yes   Patient/family provided with [place] Clinical Social Work Department's list of facilities offering this level of care within the geographic area requested by the patient (or if unable, by the patient's family).  Yes   Patient/family informed of their freedom to choose among providers that offer the needed level of care, that participate in Medicare, Medicaid or managed care program needed by the patient, have an available bed and are willing to accept the patient.  Yes   Patient/family informed of [facility]'s ownership interest in [facility] and [facility], as well as of the fact that they are under no obligation to receive care at these facilities.  PASRR submitted to EDS on 03/28/16     PASRR number received on 03/28/16     Existing PASRR number confirmed on       FL2 transmitted to all facilities in geographic area requested by pt/family on 03/28/16     FL2 transmitted to all facilities within larger geographic area on       Patient informed that his/her managed care company has contracts with or will negotiate with certain facilities, including the following:            Patient/family informed of bed offers received.  Patient chooses bed at       Physician recommends and patient chooses bed at      Patient to be transferred to   on  .  Patient to be transferred to facility by       Patient family notified on   of transfer.  Name of family member notified:        PHYSICIAN       Additional Comment:    _______________________________________________ Arlyss Repress, LCSW 03/28/2016, 1:17 PM

## 2016-03-28 NOTE — Progress Notes (Signed)
PROGRESS NOTE  Leroy [name] IRJ:[id number] DOB: 1945-05-22 DOA: 03/25/2016 PCP: [name], MD  Brief History:  70 year old male with history of severe cardiomyopathy (EF of 10-15%), recent NSTEMIwith cardiac cath (02/22/16), A. fib, CAD with history of CABG, bilateral BKA presented to the ED with bilateral knee swelling with skin weeping from his inner thighs.  and wheezing. He was rolling down a wheelchair ramp 2 days prior to admit and his wheelchair flipped over suffering superficial skin abrasions on his stumps. Patient was hospitalized November 2017 for NSTEMI and underwent cardiac cath showing occluded LIMA graft to LAD, occluded native LAD at the ostium which was opened with balloon angioplasty. Native RCA as well as the vein graft to RCA was occluded. Patient seen recently in the office as a hospital follow-up and increased his metoprolol dose due to hypertension and tachycardia. Patient was seen by his PCP 2 days prior to admission for cough and wheezing and was given a persistent for a Z-Pak, albuterol and Tessalon Perles. He then presented to the ED later that day with a fall with pain in his legs and shortness of breath. X-rays were negative for any injury. There was difficulty obtaining IV access. Therefore, the patient was initially treated with oral furosemide. Cardiology was consulted, and PICC line was requested.  Cardiology states pt is not candidate for any further intervention nor milrinone and has signed off.  Assessment/Plan: Acute onChronic systolic and diastolic heart failure (HCC) -30/16--WFUX place--resumed IV lasix -meager urine output in past 24 hours, although RN estimates pt had ~500 cc urine incontinence out his condom cath -increase IV lasix to 60 mg bid -Continue duonebs -Hold losartan given acute kidney injury. -Decreased metoprolol dose due to soft/low blood pressures -03/22/2016 office weight 277 pounds -03/25/2016 admission weight 288  pounds -Appreciate cardiology--pt is not candidate for any further intervention nor milrinone and has signed off.  Acute respiratory failure with hypoxia -Secondary to decompensated CHF -Continue bronchodilators--patient has a degree of bronchospasm -Presently stable on 2 L  Acute kidney injury Possibly hypoperfusion with CHF. Monitor with Lasix -Holding ARB -Suspect cardiorenal syndrome -Baseline creatinine 1.0-1.3 -will have to tolerate slight worsen renal function for improved respiratory status  Elevated troponins No anginal symptoms. Suspect demand ischemia with acute CHF. Monitor on telemetry.  Paroxysmal A. fib Rate controlled. Continue metoprolol and Xarelto. -CHADSVASc =5  Diabetes mellitus type 2 with peripheral artery disease (HCC) Status post bilateral BKA.  -increase Lantus to 15 units at hs -continue Novolog SSI -02/22/16--A1c--10.6  Coronary artery disease involving coronary bypass graft of native heart with angina pectoris (HCC) -Continue Plavix, beta blocker and statin. -02/22/16 Cath--occluded LIMA to LAD graft, PTCA of native LAD with marginal results; prox LAD was opened but still significantly diseased and there was no flow to the distal LAD  also has occluded RCA and occluded OM1 and OM2   Acute bronchitis -azithromycin D#4 of 5     Disposition Plan:  transfer to tele Family Communication:   No Family at bedside--Total time spent 35 minutes.  Greater than 50% spent face to face counseling and coordinating care.  Consultants:  Cardiology  Code Status:  FULL  DVT Prophylaxis: Xarelto   Procedures: As Listed in Progress Note Above  Antibiotics: Azithromycin 12/18>>>   Subjective: Patient is breathing better today.Patient denies fevers, chills, headache, chest pain, dyspnea, nausea, vomiting, diarrhea, abdominal pain, dysuria, hematuria, hematochezia, and melena.   Objective: Vitals:   03/28/16 0302  03/28/16 0439  03/28/16 0700 03/28/16 0800  BP:      Pulse:   91   Resp:   10   Temp: 98.2 F (36.8 C)   98.6 F (37 C)  TempSrc: Oral   Oral  SpO2:   95%   Weight:  132 kg (291 lb 0.1 oz)    Height:        Intake/Output Summary (Last 24 hours) at 03/28/16 0944 Last data filed at 03/28/16 0715  Gross per 24 hour  Intake              230 ml  Output              455 ml  Net             -225 ml   Weight change: 0.8 kg (1 lb 12.2 oz) Exam:   General:  Pt is alert, follows commands appropriately, not in acute distress  HEENT: No icterus, No thrush, No neck mass, [place]/AT  Cardiovascular: RRR, S1/S2, no rubs, no gallops  Respiratory: bibasilar rales.  Mild bibasilar wheezing  Abdomen: Soft/+BS, non tender, non distended, no guarding  Extremities: 1+ LE edema, No lymphangitis, No petechiae, No rashes, no synovitis;  Bilateral BKA stumps without erythema or purulent drainage   Data Reviewed: I have personally reviewed following labs and imaging studies Basic Metabolic Panel:  Recent Labs Lab 03/25/16 0605 03/26/16 0041 03/27/16 0350 03/28/16 0500  NA 136 136 135 136  K 4.6 4.7 4.2 4.1  CL 101 104 103 102  CO2 28 24 23 27   GLUCOSE 161* 146* 199* 137*  BUN 21* 24* 27* 30*  CREATININE 1.46* 1.46* 1.62* 1.62*  CALCIUM 8.4* 8.4* 8.2* 8.2*   Liver Function Tests: No results for input(s): AST, ALT, ALKPHOS, BILITOT, PROT, ALBUMIN in the last 168 hours. No results for input(s): LIPASE, AMYLASE in the last 168 hours. No results for input(s): AMMONIA in the last 168 hours. Coagulation Profile: No results for input(s): INR, PROTIME in the last 168 hours. CBC:  Recent Labs Lab 03/25/16 0605  WBC 6.3  NEUTROABS 2.9  HGB 14.1  HCT 42.8  MCV 89.0  PLT 183   Cardiac Enzymes:  Recent Labs Lab 03/25/16 1320 03/25/16 1821 03/26/16 0041  TROPONINI 0.09* 0.09* 0.08*   BNP: Invalid input(s): POCBNP CBG:  Recent Labs Lab 03/27/16 0756 03/27/16 1219 03/27/16 1554  03/27/16 2132 03/28/16 0814  GLUCAP 153* 161* 195* 158* 187*   HbA1C: No results for input(s): HGBA1C in the last 72 hours. Urine analysis:    Component Value Date/Time   COLORURINE YELLOW 11/13/2015 1148   APPEARANCEUR CLEAR 11/13/2015 1148   LABSPEC <=1.005 (A) 11/13/2015 1148   PHURINE 6.0 11/13/2015 1148   GLUCOSEU NEGATIVE 11/13/2015 1148   HGBUR SMALL (A) 11/13/2015 1148   BILIRUBINUR NEGATIVE 11/13/2015 1148   KETONESUR NEGATIVE 11/13/2015 1148   PROTEINUR 100 (A) 10/22/2015 2336   UROBILINOGEN 0.2 11/13/2015 1148   NITRITE NEGATIVE 11/13/2015 1148   LEUKOCYTESUR NEGATIVE 11/13/2015 1148   Sepsis Labs: @LABRCNTIP (procalcitonin:4,lacticidven:4) ) Recent Results (from the past 240 hour(s))  MRSA PCR Screening     Status: Abnormal   Collection Time: 03/25/16  9:27 AM  Result Value Ref Range Status   MRSA by PCR POSITIVE (A) NEGATIVE Final    Comment:        The GeneXpert MRSA Assay (FDA approved for NASAL specimens only), is one component of a comprehensive MRSA colonization surveillance program. It is not intended  to diagnose MRSA infection nor to guide or monitor treatment for MRSA infections. RESULT CALLED TO, READ BACK BY AND VERIFIED WITH: MAYES,S @ 1656 ON [id number] BY POTEAT,S      Scheduled Meds: . Chlorhexidine Gluconate Cloth  6 each Topical Q0600  . clopidogrel  75 mg Oral Daily  . furosemide  60 mg Intravenous BID  . insulin aspart  0-15 Units Subcutaneous TID WC  . insulin aspart  0-5 Units Subcutaneous QHS  . insulin glargine  15 Units Subcutaneous QHS  . ipratropium-albuterol  3 mL Nebulization Q8H  . metoprolol succinate  25 mg Oral BID  . mupirocin ointment  1 application Nasal BID  . potassium chloride  20 mEq Oral Daily  . rivaroxaban  20 mg Oral Q supper  . rosuvastatin  20 mg Oral Daily  . sodium chloride flush  10-40 mL Intracatheter Q12H  . sodium chloride flush  3 mL Intravenous Q12H   Continuous  Infusions:  Procedures/Studies: Dg Chest Portable 1 View  Result Date: 03/25/2016 CLINICAL DATA:  Shortness of breath. Bilateral lower extremity edema. 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 The cardiac silhouette remains enlarged. There is new pulmonary vascular congestion with indistinctness of the pulmonary vasculature and with bilateral perihilar and bibasilar interstitial and hazy airspace opacities. Denser airspace opacity is present in the left lower lobe, and there are likely small bilateral pleural effusions. No pneumothorax is seen. No acute osseous abnormality is identified. IMPRESSION: Cardiomegaly with new small bilateral pleural effusions and bibasilar airspace opacities compatible with edema. Superimposed infection not excluded in the left lower lobe. Electronically Signed   By: Sebastian Ache M.D.   On: 03/25/2016 07:00   Dg Knee Complete 4 Views Left  Result Date: 03/23/2016 CLINICAL DATA:  Fall out of wheelchair today. Left knee pain. Initial encounter. EXAM: LEFT KNEE - COMPLETE 4+ VIEW COMPARISON:  None. FINDINGS: No evidence of fracture, dislocation, or joint effusion. No evidence of arthropathy or other focal bone abnormality. Previous below-the-knee amputation, without evidence of osteolysis or periostitis. Prepatellar and infrapatellar soft tissue swelling noted. Peripheral vascular calcification also demonstrated. IMPRESSION: Prepatellar and infrapatellar soft tissue swelling. No evidence of acute fracture or dislocation. Below-the-knee amputation and peripheral vascular disease. Electronically Signed   By: Myles Rosenthal M.D.   On: 03/23/2016 16:05   Dg Knee Complete 4 Views Right  Result Date: 03/23/2016 CLINICAL DATA:  Larey Seat out of wheelchair today. Right knee pain. Initial encounter. EXAM: RIGHT KNEE - COMPLETE 4+ VIEW COMPARISON:  02/22/2014 FINDINGS: No evidence of fracture, dislocation, or joint effusion.  Moderate tricompartmental osteoarthritis. Previous below-the-knee amputation is stable in appearance. No evidence of osteolysis or periostitis. Prepatellar soft tissue swelling noted. Peripheral vascular calcification also demonstrated. IMPRESSION: Prepatellar soft tissue swelling. No evidence of fracture or dislocation. Moderate tricompartmental osteoarthritis. Stable appearance of below-the-knee amputation. Electronically Signed   By: Myles Rosenthal M.D.   On: 03/23/2016 16:03    Sabrinna Yearwood, DO  [facility] Pager [phone]  If 7PM-7AM, please contact night-coverage [email or web address] Password [email or web address] 03/28/2016, 9:44 AM   LOS: 3 days

## 2016-03-28 NOTE — Progress Notes (Signed)
OT Cancellation Note  Patient Details Name: Leroy [name] MRN: [id number] DOB: 1945/09/20   Cancelled Treatment:    Reason Eval/Treat Not Completed: Other (comment).  Pt did not feel up to OT evaluation today.  He feels like some of his edema needs to be managed first.  Assured him that OT would work within his tolerance and that we just want to help keep his endurance up. Will check back tomorrow as schedule permits.  Arlys Scatena 03/28/2016, 1:31 PM  Marica Otter, OTR/L [phone] 03/28/2016

## 2016-03-28 NOTE — NC FL2 (Signed)
[place] MEDICAID FL2 LEVEL OF CARE SCREENING TOOL     IDENTIFICATION  Patient Name: Leroy [name] Birthdate: 05-Dec-1945 Sex: male Admission Date (Current Location): 03/25/2016  [facility] and [place] Number:  Producer, television/film/video and Address:  [facility],  [address], [place] [address]      Provider Number: [id number]  Attending Physician Name and Address:  Catarina Hartshorn, MD  Relative Name and Phone Number:       Current Level of Care: Hospital Recommended Level of Care: Skilled Nursing Facility Prior Approval Number:    Date Approved/Denied:   PASRR Number:   [id number]  Discharge Plan: SNF    Current Diagnoses: Patient Active Problem List   Diagnosis Date Noted  . Acute on chronic combined systolic and diastolic heart failure (HCC)   . Acute pulmonary edema (HCC)   . Acute kidney injury (HCC)   . Acute CHF (congestive heart failure) (HCC) 03/25/2016  . Noncompliance 02/23/2016  . SOB (shortness of breath)   . NSTEMI (non-ST elevated myocardial infarction) (HCC) 02/22/2016  . Chest pain, atypical 02/22/2016  . Essential hypertension 02/22/2016  . Ischemic colitis (HCC) 10/25/2015  . Pain, abdominal, RLQ 10/23/2015  . Hemispheric carotid artery syndrome   . Coronary artery disease involving coronary bypass graft of native heart with angina pectoris (HCC)   . PVD (peripheral vascular disease) (HCC)   . 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 Diabetes mellitus type 2 with peripheral artery disease (HCC) 05/22/2015  . 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 Microalbuminuria 09/29/2014  . 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 Phantom limb pain (HCC) 08/16/2013  . Pain in limb- Right stump 07/06/2013  . Atherosclerosis of native arteries of the extremities with ulceration (HCC) 06/25/2013  . S/P bilateral BKA (below knee amputation) (HCC) 04/21/2013  . Chronic systolic heart failure (HCC) 04/15/2013  . Femoral artery stenosis  (HCC) 03/25/2013  . S/P BKA (below knee amputation) bilateral (HCC) 09/10/2012  . SVT (supraventricular tachycardia) (HCC) 09/02/2012  . Hyperlipidemia with target LDL less than 70 01/02/2012  . ATRIAL FIBRILLATION, PAROXYSMAL 04/13/2010    Orientation RESPIRATION BLADDER Height & Weight     Self, Time, Situation, Place  Normal Continent Weight: 291 lb 0.1 oz (132 kg) Height:  5\' 2"  (157.5 cm)  BEHAVIORAL SYMPTOMS/MOOD NEUROLOGICAL BOWEL NUTRITION STATUS      Continent Diet (Carb Modified/Heart Healthy)  AMBULATORY STATUS COMMUNICATION OF NEEDS Skin   Extensive Assist Verbally Normal                       Personal Care Assistance Level of Assistance  Bathing, Dressing Bathing Assistance: Limited assistance   Dressing Assistance: Limited assistance     Functional Limitations Info             SPECIAL CARE FACTORS FREQUENCY  PT (By licensed PT), OT (By licensed OT)     PT Frequency: 5 OT Frequency: 5            Contractures      Additional Factors Info  Code Status, Allergies Code Status Info: (P) Fullcode Allergies Info: (P) Statins, Amlodipine Besylate, Cephalexin           Current Medications (03/28/2016):  This is the current hospital active medication list Current Facility-Administered Medications  Medication Dose Route Frequency Provider Last Rate Last Dose  . 0.9 %  sodium chloride infusion  250 mL Intravenous PRN Filbert Schilder, MD      .  acetaminophen (TYLENOL) tablet 650 mg  650 mg Oral Q4H PRN Filbert SchilderAlexandria U Kadolph, MD      . benzonatate (TESSALON) capsule 100 mg  100 mg Oral TID PRN Filbert SchilderAlexandria U Kadolph, MD      . Chlorhexidine Gluconate Cloth 2 % PADS 6 each  6 each Topical Q0600 Nishant Dhungel, MD   6 each at 03/28/16 0600  . clopidogrel (PLAVIX) tablet 75 mg  75 mg Oral Daily Filbert SchilderAlexandria U Kadolph, MD   75 mg at 03/28/16 0905  . furosemide (LASIX) injection 60 mg  60 mg Intravenous BID Catarina Hartshornavid Tat, MD   60 mg at 03/28/16 0906  .  insulin aspart (novoLOG) injection 0-15 Units  0-15 Units Subcutaneous TID WC Filbert SchilderAlexandria U Kadolph, MD   5 Units at 03/28/16 1220  . insulin aspart (novoLOG) injection 0-5 Units  0-5 Units Subcutaneous QHS Filbert SchilderAlexandria U Kadolph, MD   2 Units at 03/25/16 2206  . insulin glargine (LANTUS) injection 15 Units  15 Units Subcutaneous QHS David Tat, MD      . ipratropium-albuterol (DUONEB) 0.5-2.5 (3) MG/3ML nebulizer solution 3 mL  3 mL Nebulization Q8H David Tat, MD      . metoprolol succinate (TOPROL-XL) 24 hr tablet 25 mg  25 mg Oral BID Catarina Hartshornavid Tat, MD   25 mg at 03/28/16 0905  . mupirocin ointment (BACTROBAN) 2 % 1 application  1 application Nasal BID Eddie NorthNishant Dhungel, MD   1 application at 03/28/16 0905  . ondansetron (ZOFRAN) injection 4 mg  4 mg Intravenous Q6H PRN Filbert SchilderAlexandria U Kadolph, MD      . potassium chloride SA (K-DUR,KLOR-CON) CR tablet 20 mEq  20 mEq Oral Daily Catarina Hartshornavid Tat, MD   20 mEq at 03/28/16 0905  . rivaroxaban (XARELTO) tablet 20 mg  20 mg Oral Q supper Filbert SchilderAlexandria U Kadolph, MD   20 mg at 03/27/16 1649  . rosuvastatin (CRESTOR) tablet 20 mg  20 mg Oral Daily Filbert SchilderAlexandria U Kadolph, MD   20 mg at 03/28/16 1000  . sodium chloride flush (NS) 0.9 % injection 10-40 mL  10-40 mL Intracatheter Q12H Catarina Hartshornavid Tat, MD   10 mL at 03/28/16 1000  . sodium chloride flush (NS) 0.9 % injection 10-40 mL  10-40 mL Intracatheter PRN Catarina Hartshornavid Tat, MD      . sodium chloride flush (NS) 0.9 % injection 3 mL  3 mL Intravenous Q12H Filbert SchilderAlexandria U Kadolph, MD   3 mL at 03/27/16 2200  . sodium chloride flush (NS) 0.9 % injection 3 mL  3 mL Intravenous PRN Filbert SchilderAlexandria U Kadolph, MD         Discharge Medications: Please see discharge summary for a list of discharge medications.  Relevant Imaging Results:  Relevant Lab Results:   Additional Information:  SSN: [id number]  Arlyss RepressHarrison, Allante Beane F, LCSW

## 2016-03-29 DIAGNOSIS — I10 Essential (primary) hypertension: Secondary | ICD-10-CM

## 2016-03-29 LAB — BASIC METABOLIC PANEL
Anion gap: 6 (ref 5–15)
BUN: 28 mg/dL — AB (ref 6–20)
CALCIUM: 8.4 mg/dL — AB (ref 8.9–10.3)
CO2: 28 mmol/L (ref 22–32)
CREATININE: 1.59 mg/dL — AB (ref 0.61–1.24)
Chloride: 101 mmol/L (ref 101–111)
GFR calc non Af Amer: 43 mL/min — ABNORMAL LOW (ref >60)
GFR, EST AFRICAN AMERICAN: 49 mL/min — AB (ref >60)
Glucose, Bld: 197 mg/dL — ABNORMAL HIGH (ref 65–99)
Potassium: 3.9 mmol/L (ref 3.5–5.1)
SODIUM: 135 mmol/L (ref 135–145)

## 2016-03-29 LAB — GLUCOSE, CAPILLARY
GLUCOSE-CAPILLARY: 210 mg/dL — AB (ref 65–99)
GLUCOSE-CAPILLARY: 257 mg/dL — AB (ref 65–99)
GLUCOSE-CAPILLARY: 167 mg/dL — AB (ref 65–99)
Glucose-Capillary: 88 mg/dL (ref 65–99)

## 2016-03-29 MED ORDER — HALOPERIDOL LACTATE 5 MG/ML IJ SOLN
1.0000 mg | Freq: Once | INTRAMUSCULAR | Status: AC
  Administered 2016-03-29: 1 mg via INTRAVENOUS
  Filled 2016-03-29: qty 1

## 2016-03-29 MED ORDER — METOPROLOL SUCCINATE ER 25 MG PO TB24
25.0000 mg | ORAL_TABLET | Freq: Two times a day (BID) | ORAL | Status: DC
  Administered 2016-03-29 – 2016-04-02 (×8): 25 mg via ORAL
  Filled 2016-03-29 (×8): qty 1

## 2016-03-29 MED ORDER — IPRATROPIUM-ALBUTEROL 0.5-2.5 (3) MG/3ML IN SOLN
3.0000 mL | Freq: Three times a day (TID) | RESPIRATORY_TRACT | Status: DC
  Administered 2016-03-30 – 2016-03-31 (×3): 3 mL via RESPIRATORY_TRACT
  Filled 2016-03-29 (×4): qty 3

## 2016-03-29 MED ORDER — INSULIN GLARGINE 100 UNIT/ML ~~LOC~~ SOLN
25.0000 [IU] | Freq: Every day | SUBCUTANEOUS | Status: DC
  Administered 2016-03-29 – 2016-04-01 (×4): 25 [IU] via SUBCUTANEOUS
  Filled 2016-03-29 (×5): qty 0.25

## 2016-03-29 MED ORDER — METOPROLOL SUCCINATE ER 25 MG PO TB24
12.5000 mg | ORAL_TABLET | Freq: Two times a day (BID) | ORAL | Status: DC
  Administered 2016-03-29: 12.5 mg via ORAL

## 2016-03-29 MED ORDER — INSULIN ASPART 100 UNIT/ML ~~LOC~~ SOLN
3.0000 [IU] | Freq: Three times a day (TID) | SUBCUTANEOUS | Status: DC
  Administered 2016-03-29 – 2016-04-02 (×13): 3 [IU] via SUBCUTANEOUS

## 2016-03-29 MED ORDER — METOPROLOL SUCCINATE ER 25 MG PO TB24
12.5000 mg | ORAL_TABLET | Freq: Once | ORAL | Status: AC
  Administered 2016-03-29: 12.5 mg via ORAL
  Filled 2016-03-29: qty 1

## 2016-03-29 MED ORDER — GUAIFENESIN-DM 100-10 MG/5ML PO SYRP
15.0000 mL | ORAL_SOLUTION | ORAL | Status: DC | PRN
  Administered 2016-03-29 – 2016-04-01 (×5): 15 mL via ORAL
  Filled 2016-03-29 (×5): qty 20

## 2016-03-29 NOTE — Care Management Important Message (Signed)
Important Message  Patient Details IM Letter given to Cookie/Case Manager to present to Patient Name: Leroy [name] MRN: [id number] Date of Birth: July 27, 1945   Medicare Important Message Given:  Yes    Caren Macadam 03/29/2016, 10:35 AM

## 2016-03-29 NOTE — Progress Notes (Signed)
PROGRESS NOTE    Leroy [name]  ZOX:[id number] DOB: 1946-02-13 DOA: 03/25/2016 PCP: [name], MD    Brief Narrative:  70 year old male with history of severe cardiomyopathy (EF of 10-15%), recent NSTEMIwith cardiac cath (02/22/16), A. fib, CAD with history of CABG, bilateral BKA presented to the ED with bilateral knee swelling with skin weeping from his inner thighs.  and wheezing. He was rolling down a wheelchair ramp 2 days prior to admit and his wheelchair flipped over suffering superficial skin abrasions on his stumps. Patient was hospitalized November 2017 for NSTEMI and underwent cardiac cath showing occluded LIMA graft to LAD, occluded native LAD at the ostium which was opened with balloon angioplasty. Native RCA as well as the vein graft to RCA was occluded. Patient seen recently in the office as a hospital follow-up and increased his metoprolol dose due to hypertension and tachycardia. Patient was seen by his PCP 2 days prior to admission for cough and wheezing and was given a persistent for a Z-Pak, albuterol and Tessalon Perles. He then presented to the ED later that day with a fall with pain in his legs and shortness of breath. X-rays were negative for any injury.There was difficulty obtaining IV access. Therefore, the patient was initially treated with oral furosemide. Cardiology was consulted, and PICC line was requested.  Cardiology states pt is not candidate for any further intervention nor milrinone and has signed off.   Assessment & Plan:   Principal Problem:   Acute on chronic combined systolic and diastolic heart failure (HCC) Active Problems:   ATRIAL FIBRILLATION, PAROXYSMAL   S/P BKA (below knee amputation) bilateral (HCC)   Chronic systolic heart failure (HCC)   S/P bilateral BKA (below knee amputation) (HCC)   Stroke (cerebrum) (HCC)   Diabetes mellitus type 2 with peripheral artery disease (HCC)   Coronary artery disease involving coronary bypass graft of  native heart with angina pectoris (HCC)   Essential hypertension   Acute CHF (congestive heart failure) (HCC)   Acute pulmonary edema (HCC)   Acute kidney injury (HCC)  Acute onChronic systolic and diastolic heart failure (HCC) -81/19--JYNW place--resumed IV lasix -Good urine output over the past 24 hours with a urine output of 2.4 L after increased dose of IV Lasix. Patient is -2.795 L during this hospitalization. -Continue IV lasix 60 mg bid -Continue duonebs -Hold losartan given acute kidney injury. -Decreased metoprolol dose due to soft/low blood pressures -03/22/2016 office weight 277 pounds -03/25/2016 admission weight 288 pounds - 03/29/2016 current weight 283 pounds. -Appreciate cardiology--pt is not candidate for any further intervention nor milrinone and has signed off.  Acute respiratory failure with hypoxia -Secondary to decompensated CHF -Continue bronchodilators--patient has a degree of bronchospasm -Presently stable on 2 L -Patient hasn't placed on IV Lasix with good diuresis with a urine output of 2.4 L over the past 24 hours. Patient is currently -2.795 L during this hospitalization. Continue metoprolol. Follow.  Acute kidney injury Possibly hypoperfusion with CHF. Monitor with Lasix -Holding ARB -Suspect cardiorenal syndrome -Baseline creatinine 1.0-1.3 -will have to tolerate slight worsen renal function for improved respiratory status  Elevated troponins No anginal symptoms. Suspect demand ischemia with acute CHF. Monitor on telemetry.  Paroxysmal A. fib Rate controlled. Continue metoprolol for rate control and Xarelto for anticoagulation. -CHADSVASc =5  Diabetes mellitus type 2 with peripheral artery disease (HCC) Status post bilateral BKA.  -increase Lantus to 25 units at hs -continue Novolog SSI - Place on meal coverage insulin 3 units with meals -  02/22/16--A1c--10.6  Coronary artery disease involving coronary bypass graft of native heart  with angina pectoris (HCC) -Continue Plavix, beta blocker and statin. -02/22/16 Cath--occluded LIMA to LAD graft, PTCA of native LAD with marginal results; prox LAD was opened but still significantly diseased and there was no flow to the distal LAD  also has occluded RCA and occluded OM1 and OM2   Acute bronchitis -azithromycin D#5 of 5     DVT prophylaxis: xarelto Code Status: Full Family Communication: Updated patient. No family at bedside. Disposition Plan: SNF when medically stable with improvement in respiratory status and weight back to baseline.   Consultants:   Cardiology: Dr.Nahser 03/25/2016  Procedures:   PICC 03/27/2016  Chest x-ray 03/25/2016  Antimicrobials:   Azithromycin 03/25/2016>>>> 03/28/2016     Subjective: Patient states some improvement with shortness of breath. No chest pain.  Objective: Vitals:   03/28/16 2036 03/29/16 0645 03/29/16 0724 03/29/16 0907  BP: 131/62 100/88  (!) 153/86  Pulse: 96 99  97  Resp:  (!) 26    Temp: 98 F (36.7 C) 97.8 F (36.6 C)    TempSrc: Oral Oral    SpO2: 98% 94% 97%   Weight:  128.5 kg (283 lb 6.4 oz)    Height:        Intake/Output Summary (Last 24 hours) at 03/29/16 1047 Last data filed at 03/29/16 0700  Gross per 24 hour  Intake              600 ml  Output             2400 ml  Net            -1800 ml   Filed Weights   03/27/16 0500 03/28/16 0439 03/29/16 0645  Weight: 131.2 kg (289 lb 3.9 oz) 132 kg (291 lb 0.1 oz) 128.5 kg (283 lb 6.4 oz)    Examination:  General exam: Appears calm and comfortable  Respiratory system: Clear to auscultationAnterior lung fields. Cardiovascular system: S1 & S2 heard, RRR. No JVD, murmurs, rubs, gallops or clicks. No pedal edema. Gastrointestinal system: Abdomen is nondistended, soft and nontender. No organomegaly or masses felt. Normal bowel sounds heard. Central nervous system: Alert and oriented. No focal neurological deficits. Extremities: Bilateral  BKA's. Skin: No rashes, lesions or ulcers Psychiatry: Judgement and insight appear normal. Mood & affect appropriate.     Data Reviewed: I have personally reviewed following labs and imaging studies  CBC:  Recent Labs Lab 03/25/16 0605  WBC 6.3  NEUTROABS 2.9  HGB 14.1  HCT 42.8  MCV 89.0  PLT 183   Basic Metabolic Panel:  Recent Labs Lab 03/25/16 0605 03/26/16 0041 03/27/16 0350 03/28/16 0500 03/29/16 0417  NA 136 136 135 136 135  K 4.6 4.7 4.2 4.1 3.9  CL 101 104 103 102 101  CO2 28 24 23 27 28   GLUCOSE 161* 146* 199* 137* 197*  BUN 21* 24* 27* 30* 28*  CREATININE 1.46* 1.46* 1.62* 1.62* 1.59*  CALCIUM 8.4* 8.4* 8.2* 8.2* 8.4*   GFR: Estimated Creatinine Clearance: 52.2 mL/min (by C-G formula based on SCr of 1.59 mg/dL (H)). Liver Function Tests: No results for input(s): AST, ALT, ALKPHOS, BILITOT, PROT, ALBUMIN in the last 168 hours. No results for input(s): LIPASE, AMYLASE in the last 168 hours. No results for input(s): AMMONIA in the last 168 hours. Coagulation Profile: No results for input(s): INR, PROTIME in the last 168 hours. Cardiac Enzymes:  Recent Labs Lab 03/25/16 1320 03/25/16  1821 03/26/16 0041  TROPONINI 0.09* 0.09* 0.08*   BNP (last 3 results) No results for input(s): PROBNP in the last 8760 hours. HbA1C: No results for input(s): HGBA1C in the last 72 hours. CBG:  Recent Labs Lab 03/28/16 0814 03/28/16 1138 03/28/16 1646 03/28/16 2033 03/29/16 0802  GLUCAP 187* 225* 178* 194* 210*   Lipid Profile: No results for input(s): CHOL, HDL, LDLCALC, TRIG, CHOLHDL, LDLDIRECT in the last 72 hours. Thyroid Function Tests: No results for input(s): TSH, T4TOTAL, FREET4, T3FREE, THYROIDAB in the last 72 hours. Anemia Panel: No results for input(s): VITAMINB12, FOLATE, FERRITIN, TIBC, IRON, RETICCTPCT in the last 72 hours. Sepsis Labs: No results for input(s): PROCALCITON, LATICACIDVEN in the last 168 hours.  Recent Results (from the  past 240 hour(s))  MRSA PCR Screening     Status: Abnormal   Collection Time: 03/25/16  9:27 AM  Result Value Ref Range Status   MRSA by PCR POSITIVE (A) NEGATIVE Final    Comment:        The GeneXpert MRSA Assay (FDA approved for NASAL specimens only), is one component of a comprehensive MRSA colonization surveillance program. It is not intended to diagnose MRSA infection nor to guide or monitor treatment for MRSA infections. RESULT CALLED TO, READ BACK BY AND VERIFIED WITH: MAYES,S @ 1656 ON [id number] BY POTEAT,S          Radiology Studies: No results found.      Scheduled Meds: . Chlorhexidine Gluconate Cloth  6 each Topical Q0600  . clopidogrel  75 mg Oral Daily  . furosemide  60 mg Intravenous BID  . insulin aspart  0-15 Units Subcutaneous TID WC  . insulin aspart  0-5 Units Subcutaneous QHS  . insulin aspart  3 Units Subcutaneous TID WC  . insulin glargine  25 Units Subcutaneous QHS  . ipratropium-albuterol  3 mL Nebulization Q8H  . metoprolol succinate  12.5 mg Oral Once  . metoprolol succinate  25 mg Oral BID  . mupirocin ointment  1 application Nasal BID  . potassium chloride  20 mEq Oral Daily  . rivaroxaban  20 mg Oral Q supper  . rosuvastatin  20 mg Oral Daily  . sodium chloride flush  10-40 mL Intracatheter Q12H  . sodium chloride flush  3 mL Intravenous Q12H   Continuous Infusions:   LOS: 4 days    Time spent: 40 minutes    THOMPSON,DANIEL, MD [facility] Pager [phone]  If 7PM-7AM, please contact night-coverage [email or web address] Password [facility] 03/29/2016, 10:47 AM

## 2016-03-29 NOTE — Progress Notes (Signed)
Patient was having difficulty sleeping, attempting to get off of bed, very agitated. NP on call made aware and new order given. Patient sleep for few minutes, wake up, remove condom cath, remove all tele lead and wanted to get out bed. Denies any pain and would not relate any problem to staff. Patient was also wheezing, refuses neb tx and after speaking to his  God son and with much encouragement from staff, patient agrees to receive  neb tx. Resp. made aware. VS stable.

## 2016-03-29 NOTE — Evaluation (Signed)
Occupational Therapy Evaluation Patient Details Name: Leroy [name] MRN: [id number] DOB: November 17, 1945 Today's Date: 03/29/2016    History of Present Illness Leroy [name] is a 70 y.o. male with medical history significant of systolic CHF (last Echo 1 month prior showed EF of 10-15%), recent NSTEMI, atrial fibrillation, CAD with bypass graft, bilateral BKA who presents to the ED 12/18 with bilateral knee swelling, skin weeping and wheezing. Patient was seen in ED 12/6 after power chair ran off ramp and chair fell on the patient. He was sent home.  Patient was previously admitted in November with NSTEMI and during that hospitalization underwent a Cardiac cath    Clinical Impression   Pt was admitted for the above. He will benefit from continued OT in acute and snf. Pt has LE edema and prostheses do not fit.  He was mod I with adls prior to admission.  Goals are for supervision to min A    Follow Up Recommendations  SNF    Equipment Recommendations  None recommended by OT    Recommendations for Other Services       Precautions / Restrictions Precautions Precautions: Fall Restrictions Weight Bearing Restrictions: No      Mobility Bed Mobility Overal bed mobility: Needs Assistance Bed Mobility: Supine to Sit;Sit to Supine     Supine to sit: Min guard Sit to supine: Min guard   General bed mobility comments: for safety:  used rails  Transfers                 General transfer comment: deferred. pt usually gets up with prostheses.  Have amputee sling for maximove available    Balance       Sitting balance - Comments: min guard for safety.  Pt tends to flex trunk forward                                    ADL Overall ADL's : Needs assistance/impaired     Grooming: Wash/dry face;Sitting;Min guard   Upper Body Bathing: Min guard;Sitting   Lower Body Bathing: Moderate assistance;Sitting/lateral leans;Bed level   Upper Body Dressing : Min  guard;Sitting   Lower Body Dressing: Moderate assistance;Bed level                 General ADL Comments: sat eobx 20 minutes and performed some bathing and applied lotion     Vision     Perception     Praxis      Pertinent Vitals/Pain Pain Assessment: No/denies pain     Hand Dominance Right   Extremity/Trunk Assessment Upper Extremity Assessment Upper Extremity Assessment: Overall WFL for tasks assessed (grossly4+/5)           Communication Communication Communication: No difficulties   Cognition Arousal/Alertness: Awake/alert Behavior During Therapy: WFL for tasks assessed/performed Overall Cognitive Status: Within Functional Limits for tasks assessed                     General Comments       Exercises       Shoulder Instructions      Home Living Family/patient expects to be discharged to:: Private residence Living Arrangements: Alone                 Bathroom Shower/Tub: Tub/shower unit Shower/tub characteristics: Curtain Firefighter: Handicapped height Bathroom Accessibility: Yes   Home Equipment: Environmental consultant - 2 wheels;Shower seat;Wheelchair -  power          Prior Functioning/Environment Level of Independence: Independent with assistive device(s)        Comments: Uses bilateral prostheses,electric w/c and walker to get around, independent in transfers,reports that his prosthetics do not fit due to edema since saturday.        OT Problem List: Decreased strength;Decreased activity tolerance;Impaired balance (sitting and/or standing);Cardiopulmonary status limiting activity   OT Treatment/Interventions: Self-care/ADL training;DME and/or AE instruction;Energy conservation;Patient/family education;Balance training;Therapeutic activities    OT Goals(Current goals can be found in the care plan section) Acute Rehab OT Goals Patient Stated Goal: none stated; agreeable to therapy OT Goal Formulation: With patient Time For Goal  Achievement: 04/12/16 Potential to Achieve Goals: Good ADL Goals Pt Will Perform Lower Body Bathing: with supervision;with set-up;bed level Pt Will Transfer to Toilet: with min assist;bedside commode;stand pivot transfer (if bil prosthetics fit (due to edema)) Additional ADL Goal #1: pt will sit eob x 15 minutes and perform UB adls with supervision  OT Frequency: Min 2X/week   Barriers to D/C:            Co-evaluation PT/OT/SLP Co-Evaluation/Treatment: Yes Reason for Co-Treatment: For patient/therapist safety PT goals addressed during session: Mobility/safety with mobility;Balance OT goals addressed during session: ADL's and self-care      End of Session    Activity Tolerance: Patient tolerated treatment well Patient left: in bed;with call bell/phone within reach;with bed alarm set   Time: 1335-1400 OT Time Calculation (min): 25 min Charges:  OT General Charges $OT Visit: 1 Procedure OT Evaluation $OT Eval Low Complexity: 1 Procedure G-Codes:    Anely Spiewak 03/29/2016, 2:26 PM Marica OtterMaryellen Aislee Landgren, OTR/L [phone] 03/29/2016

## 2016-03-29 NOTE — Progress Notes (Signed)
Physical Therapy Treatment Patient Details Name: Leroy [name] MRN: [id number] DOB: 05/15/1945 Today's Date: 03/29/2016    History of Present Illness Leroy [name] is a 70 y.o. male with medical history significant of systolic CHF (last Echo 1 month prior showed EF of 10-15%), recent NSTEMI, atrial fibrillation, CAD with bypass graft, bilateral BKA who presents to the ED 12/18 with bilateral knee swelling, skin weeping and wheezing. Patient was seen in ED 12/6 after power chair ran off ramp and chair fell on the patient. He was sent home.  Patient was previously admitted in November with NSTEMI and during that hospitalization underwent a Cardiac cath     PT Comments    The patient is much better this visit in regards to his MS. He was able to mobilize to the bed edge and sat x 20". PT recommends that the patient  Is assisted to recliner via special amputee sling with maximove lift. The patient's Sats were 100% but noted with dyspnea 4/4 at times when moving. Continue PT while in acute care.  Follow Up Recommendations  SNF;Supervision/Assistance - 24 hour     Equipment Recommendations  None recommended by PT    Recommendations for Other Services       Precautions / Restrictions Precautions Precautions: Fall Precaution Comments: SOB Restrictions Weight Bearing Restrictions: No    Mobility  Bed Mobility Overal bed mobility: Needs Assistance Bed Mobility: Supine to Sit;Sit to Supine     Supine to sit: Min guard Sit to supine: Min guard   General bed mobility comments: for safety:  used rails  Transfers                 General transfer comment: deferred. pt usually gets up with prostheses.  Have amputee sling for maximove available  Ambulation/Gait                 Stairs            Wheelchair Mobility    Modified Rankin (Stroke Patients Only)       Balance       Sitting balance - Comments: min guard for safety.  Pt tends to flex trunk  forward, holds with 1 UE onto back of chair or rail. sat x 20 minutes.                            Cognition Arousal/Alertness: Awake/alert Behavior During Therapy: WFL for tasks assessed/performed Overall Cognitive Status: Within Functional Limits for tasks assessed                 General Comments: the patient is coherent and able to communucate without ny problems  this visit, less restless than previous visit.    Exercises      General Comments        Pertinent Vitals/Pain Pain Assessment: No/denies pain    Home Living Family/patient expects to be discharged to:: Private residence Living Arrangements: Alone           Home Equipment: Dan HumphreysWalker - 2 wheels;Shower seat;Wheelchair - power      Prior Function Level of Independence: Independent with assistive device(s)      Comments: Uses bilateral prostheses,electric w/c and walker to get around, independent in transfers,reports that his prosthetics do not fit due to edema since saturday.   PT Goals (current goals can now be found in the care plan section) Acute Rehab PT Goals Patient Stated Goal: none stated; agreeable  to therapy Progress towards PT goals: Progressing toward goals    Frequency    Min 3X/week      PT Plan Current plan remains appropriate    Co-evaluation PT/OT/SLP Co-Evaluation/Treatment: Yes Reason for Co-Treatment: For patient/therapist safety PT goals addressed during session: Mobility/safety with mobility OT goals addressed during session: ADL's and self-care     End of Session   Activity Tolerance: Patient tolerated treatment well Patient left: in bed;with call bell/phone within reach;with bed alarm set     Time: 5329-9242 PT Time Calculation (min) (ACUTE ONLY): 26 min  Charges:  $Therapeutic Activity: 8-22 mins                    G Codes:      Rada Hay 03/29/2016, 2:35 PM Blanchard Kelch PT [phone]

## 2016-03-29 NOTE — Progress Notes (Signed)
Inpatient Diabetes Program Recommendations  AACE/ADA: New Consensus Statement on Inpatient Glycemic Control (2015)  Target Ranges:  Prepandial:   less than 140 mg/dL      Peak postprandial:   less than 180 mg/dL (1-2 hours)      Critically ill patients:  140 - 180 mg/dL   Results for Leroy [name], Leroy [name] (MRN [id number]) as of 03/29/2016 08:08  Ref. Range 03/28/2016 08:14 03/28/2016 11:38 03/28/2016 16:46 03/28/2016 20:33  Glucose-Capillary Latest Ref Range: 65 - 99 mg/dL 409187 (H) 811225 (H) 914178 (H) 194 (H)   Results for Leroy [name], Leroy [name] (MRN [id number]) as of 03/29/2016 08:08  Ref. Range 03/29/2016 08:02  Glucose-Capillary Latest Ref Range: 65 - 99 mg/dL 086210 (H)    Home DM Meds: Lantus 50 units BID       Novolog 10 units TID  Current Insulin Orders: Lantus 15 units QHS      Novolog Moderate Correction Scale/ SSI (0-15 units) TID AC + HS      MD- Please consider the following in-hospital insulin adjustments:  1. Increase Lantus to 25 units QHS (25% total home dose)  2. Start low dose Novolog Meal Coverage: Novolog 3 units TID with meals (hold if pt eats <50% of meal)     --Will follow patient during hospitalization--  Ambrose FinlandJeannine Johnston Roshun Klingensmith RN, MSN, CDE Diabetes Coordinator Inpatient Glycemic Control Team Team Pager: [phone] (8a-5p)

## 2016-03-29 NOTE — Clinical Social Work Placement (Signed)
Patient has a bed at [facility]. CSW has completed FL2 & will continue to follow and assist with discharge when ready.    Lincoln Maxin, LCSW [facility] Clinical Social Worker cell #: [phone]     CLINICAL SOCIAL WORK PLACEMENT  NOTE  Date:  03/29/2016  Patient Details  Name: Leroy [name] MRN: [id number] Date of Birth: [birth year]  Clinical Social Work is seeking post-discharge placement for this patient at the Skilled  Nursing Facility level of care (*CSW will initial, date and re-position this form in  chart as items are completed):  Yes   Patient/family provided with [facility] Clinical Social Work Department's list of facilities offering this level of care within the geographic area requested by the patient (or if unable, by the patient's family).  Yes   Patient/family informed of their freedom to choose among providers that offer the needed level of care, that participate in Medicare, Medicaid or managed care program needed by the patient, have an available bed and are willing to accept the patient.  Yes   Patient/family informed of [facility] ownership interest in [facility] and [facility], as well as of the fact that they are under no obligation to receive care at these facilities.  PASRR submitted to EDS on 03/28/16     PASRR number received on 03/28/16     Existing PASRR number confirmed on       FL2 transmitted to all facilities in geographic area requested by pt/family on 03/28/16     FL2 transmitted to all facilities within larger geographic area on       Patient informed that his/her managed care company has contracts with or will negotiate with certain facilities, including the following:        Yes   Patient/family informed of bed offers received.  Patient chooses bed at [facility]     Physician recommends and patient chooses bed at      Patient to be transferred to [facility] on   .  Patient to be transferred to facility by       Patient family notified on   of transfer.  Name of family member notified:        PHYSICIAN       Additional Comment:    _______________________________________________ Arlyss Repress, LCSW 03/29/2016, 9:47 AM

## 2016-03-30 LAB — BASIC METABOLIC PANEL
Anion gap: 9 (ref 5–15)
BUN: 26 mg/dL — AB (ref 6–20)
CALCIUM: 8.4 mg/dL — AB (ref 8.9–10.3)
CO2: 27 mmol/L (ref 22–32)
CREATININE: 1.43 mg/dL — AB (ref 0.61–1.24)
Chloride: 102 mmol/L (ref 101–111)
GFR calc Af Amer: 56 mL/min — ABNORMAL LOW (ref >60)
GFR calc non Af Amer: 48 mL/min — ABNORMAL LOW (ref >60)
GLUCOSE: 158 mg/dL — AB (ref 65–99)
Potassium: 3.8 mmol/L (ref 3.5–5.1)
Sodium: 138 mmol/L (ref 135–145)

## 2016-03-30 LAB — GLUCOSE, CAPILLARY
GLUCOSE-CAPILLARY: 153 mg/dL — AB (ref 65–99)
Glucose-Capillary: 126 mg/dL — ABNORMAL HIGH (ref 65–99)
Glucose-Capillary: 240 mg/dL — ABNORMAL HIGH (ref 65–99)
Glucose-Capillary: 149 mg/dL — ABNORMAL HIGH (ref 65–99)

## 2016-03-30 MED ORDER — FUROSEMIDE 10 MG/ML IJ SOLN
20.0000 mg | Freq: Once | INTRAMUSCULAR | Status: AC
  Administered 2016-03-30: 20 mg via INTRAVENOUS
  Filled 2016-03-30: qty 2

## 2016-03-30 MED ORDER — FUROSEMIDE 10 MG/ML IJ SOLN
80.0000 mg | Freq: Two times a day (BID) | INTRAMUSCULAR | Status: DC
  Administered 2016-03-30 – 2016-04-02 (×6): 80 mg via INTRAVENOUS
  Filled 2016-03-30 (×6): qty 8

## 2016-03-30 MED ORDER — LIP MEDEX EX OINT
TOPICAL_OINTMENT | CUTANEOUS | Status: DC | PRN
  Filled 2016-03-30: qty 7

## 2016-03-30 NOTE — Progress Notes (Signed)
PROGRESS NOTE    NOAL KRATZER  VHQ:[id number] DOB: 1945/08/20 DOA: 03/25/2016 PCP: [name], MD    Brief Narrative:  70 year old male with history of severe cardiomyopathy (EF of 10-15%), recent NSTEMIwith cardiac cath (02/22/16), A. fib, CAD with history of CABG, bilateral BKA presented to the ED with bilateral knee swelling with skin weeping from his inner thighs.  and wheezing. He was rolling down a wheelchair ramp 2 days prior to admit and his wheelchair flipped over suffering superficial skin abrasions on his stumps. Patient was hospitalized November 2017 for NSTEMI and underwent cardiac cath showing occluded LIMA graft to LAD, occluded native LAD at the ostium which was opened with balloon angioplasty. Native RCA as well as the vein graft to RCA was occluded. Patient seen recently in the office as a hospital follow-up and increased his metoprolol dose due to hypertension and tachycardia. Patient was seen by his PCP 2 days prior to admission for cough and wheezing and was given a persistent for a Z-Pak, albuterol and Tessalon Perles. He then presented to the ED later that day with a fall with pain in his legs and shortness of breath. X-rays were negative for any injury.There was difficulty obtaining IV access. Therefore, the patient was initially treated with oral furosemide. Cardiology was consulted, and PICC line was requested.  Cardiology states pt is not candidate for any further intervention nor milrinone and has signed off.   Assessment & Plan:   Principal Problem:   Acute on chronic combined systolic and diastolic heart failure (HCC) Active Problems:   ATRIAL FIBRILLATION, PAROXYSMAL   S/P BKA (below knee amputation) bilateral (HCC)   Chronic systolic heart failure (HCC)   S/P bilateral BKA (below knee amputation) (HCC)   Stroke (cerebrum) (HCC)   Diabetes mellitus type 2 with peripheral artery disease (HCC)   Coronary artery disease involving coronary bypass graft of  native heart with angina pectoris (HCC)   Essential hypertension   Acute CHF (congestive heart failure) (HCC)   Acute pulmonary edema (HCC)   Acute kidney injury (HCC)  Acute onChronic systolic and diastolic heart failure (HCC) -41/32--GMWN place--resumed IV lasix -Good urine output over the past 24 hours with a urine output of 1.675 L after increased dose of IV Lasix. Patient is -3.495 L during this hospitalization. -Increase IV lasix 80 mg bid -Continue duonebs -Hold losartan given acute kidney injury. -Decreased metoprolol dose due to soft/low blood pressures -03/22/2016 office weight 277 pounds -03/25/2016 admission weight 288 pounds - 03/29/2016 current weight 283 pounds. - 03/30/2016 weight 281 pounds -Appreciate cardiology--pt is not candidate for any further intervention nor milrinone and has signed off.  Acute respiratory failure with hypoxia -Secondary to decompensated CHF -Continue bronchodilators--patient has a degree of bronchospasm -Presently stable on 2 L -Patient has been placed on IV Lasix with good diuresis with a urine output of 1.675 L over the past 24 hours. Patient is currently -3.495 L during this hospitalization. Increase Lasix to 80 mg IV every 12 hours. Continue metoprolol. Follow.  Acute kidney injury Possibly hypoperfusion with CHF. Monitor with Lasix -Holding ARB -Suspect cardiorenal syndrome -Baseline creatinine 1.0-1.3 -will have to tolerate slight worsen renal function for improved respiratory status  Elevated troponins No anginal symptoms. Suspect demand ischemia with acute CHF. Monitor on telemetry.  Paroxysmal A. fib Rate controlled. Continue metoprolol for rate control and Xarelto for anticoagulation. -CHADSVASc =5  Diabetes mellitus type 2 with peripheral artery disease (HCC) Status post bilateral BKA.  -increased Lantus to 25 units  at hs -continue Novolog SSI - Continue meal coverage insulin 3 units with  meals -02/22/16--A1c--10.6  Coronary artery disease involving coronary bypass graft of native heart with angina pectoris (HCC) -Continue Plavix, beta blocker and statin. -02/22/16 Cath--occluded LIMA to LAD graft, PTCA of native LAD with marginal results; prox LAD was opened but still significantly diseased and there was no flow to the distal LAD  also has occluded RCA and occluded OM1 and OM2   Acute bronchitis - s/p 5 days of azithromycin.     DVT prophylaxis: xarelto Code Status: Full Family Communication: Updated patient. No family at bedside. Disposition Plan: SNF when medically stable with improvement in respiratory status and weight back to baseline.   Consultants:   Cardiology: Dr.Nahser 03/25/2016  Procedures:   PICC 03/27/2016  Chest x-ray 03/25/2016  Antimicrobials:   Azithromycin 03/25/2016>>>> 03/28/2016     Subjective: Patient states improvement with shortness of breath. No chest pain.  Objective: Vitals:   03/29/16 2217 03/30/16 0408 03/30/16 0646 03/30/16 0822  BP: (!) 120/93 122/84    Pulse: 98 97    Resp: 18 18    Temp: 99 F (37.2 C) 98.2 F (36.8 C)    TempSrc: Oral Oral    SpO2: 96% 96%  96%  Weight:   127.8 kg (281 lb 11.2 oz)   Height:        Intake/Output Summary (Last 24 hours) at 03/30/16 1251 Last data filed at 03/30/16 1000  Gross per 24 hour  Intake              960 ml  Output             1975 ml  Net            -1015 ml   Filed Weights   03/28/16 0439 03/29/16 0645 03/30/16 0646  Weight: 132 kg (291 lb 0.1 oz) 128.5 kg (283 lb 6.4 oz) 127.8 kg (281 lb 11.2 oz)    Examination:  General exam: Appears calm and comfortable  Respiratory system: Bibasilar crackles. Cardiovascular system: S1 & S2 heard, RRR. No JVD, murmurs, rubs, gallops or clicks. No pedal edema. Gastrointestinal system: Abdomen is nondistended, soft and nontender. No organomegaly or masses felt. Normal bowel sounds heard. Central nervous system:  Alert and oriented. No focal neurological deficits. Extremities: Bilateral BKA's. Skin: No rashes, lesions or ulcers Psychiatry: Judgement and insight appear normal. Mood & affect appropriate.     Data Reviewed: I have personally reviewed following labs and imaging studies  CBC:  Recent Labs Lab 03/25/16 0605  WBC 6.3  NEUTROABS 2.9  HGB 14.1  HCT 42.8  MCV 89.0  PLT 183   Basic Metabolic Panel:  Recent Labs Lab 03/26/16 0041 03/27/16 0350 03/28/16 0500 03/29/16 0417 03/30/16 0421  NA 136 135 136 135 138  K 4.7 4.2 4.1 3.9 3.8  CL 104 103 102 101 102  CO2 24 23 27 28 27   GLUCOSE 146* 199* 137* 197* 158*  BUN 24* 27* 30* 28* 26*  CREATININE 1.46* 1.62* 1.62* 1.59* 1.43*  CALCIUM 8.4* 8.2* 8.2* 8.4* 8.4*   GFR: Estimated Creatinine Clearance: 57.9 mL/min (by C-G formula based on SCr of 1.43 mg/dL (H)). Liver Function Tests: No results for input(s): AST, ALT, ALKPHOS, BILITOT, PROT, ALBUMIN in the last 168 hours. No results for input(s): LIPASE, AMYLASE in the last 168 hours. No results for input(s): AMMONIA in the last 168 hours. Coagulation Profile: No results for input(s): INR, PROTIME in the last  168 hours. Cardiac Enzymes:  Recent Labs Lab 03/25/16 1320 03/25/16 1821 03/26/16 0041  TROPONINI 0.09* 0.09* 0.08*   BNP (last 3 results) No results for input(s): PROBNP in the last 8760 hours. HbA1C: No results for input(s): HGBA1C in the last 72 hours. CBG:  Recent Labs Lab 03/29/16 1156 03/29/16 1708 03/29/16 2212 03/30/16 0730 03/30/16 1152  GLUCAP 257* 88 167* 126* 240*   Lipid Profile: No results for input(s): CHOL, HDL, LDLCALC, TRIG, CHOLHDL, LDLDIRECT in the last 72 hours. Thyroid Function Tests: No results for input(s): TSH, T4TOTAL, FREET4, T3FREE, THYROIDAB in the last 72 hours. Anemia Panel: No results for input(s): VITAMINB12, FOLATE, FERRITIN, TIBC, IRON, RETICCTPCT in the last 72 hours. Sepsis Labs: No results for input(s):  PROCALCITON, LATICACIDVEN in the last 168 hours.  Recent Results (from the past 240 hour(s))  MRSA PCR Screening     Status: Abnormal   Collection Time: 03/25/16  9:27 AM  Result Value Ref Range Status   MRSA by PCR POSITIVE (A) NEGATIVE Final    Comment:        The GeneXpert MRSA Assay (FDA approved for NASAL specimens only), is one component of a comprehensive MRSA colonization surveillance program. It is not intended to diagnose MRSA infection nor to guide or monitor treatment for MRSA infections. RESULT CALLED TO, READ BACK BY AND VERIFIED WITH: MAYES,S @ 1656 ON [id number] BY POTEAT,S          Radiology Studies: No results found.      Scheduled Meds: . Chlorhexidine Gluconate Cloth  6 each Topical Q0600  . clopidogrel  75 mg Oral Daily  . furosemide  60 mg Intravenous BID  . insulin aspart  0-15 Units Subcutaneous TID WC  . insulin aspart  0-5 Units Subcutaneous QHS  . insulin aspart  3 Units Subcutaneous TID WC  . insulin glargine  25 Units Subcutaneous QHS  . ipratropium-albuterol  3 mL Nebulization TID  . metoprolol succinate  25 mg Oral BID  . mupirocin ointment  1 application Nasal BID  . potassium chloride  20 mEq Oral Daily  . rivaroxaban  20 mg Oral Q supper  . rosuvastatin  20 mg Oral Daily  . sodium chloride flush  10-40 mL Intracatheter Q12H  . sodium chloride flush  3 mL Intravenous Q12H   Continuous Infusions:   LOS: 5 days    Time spent: 40 minutes    Nakiah Osgood, MD [facility] Pager [phone]  If 7PM-7AM, please contact night-coverage [email or web address] Password [email or web address] 03/30/2016, 12:51 PM

## 2016-03-30 NOTE — Progress Notes (Signed)
Report to Paula RN to assume care of patient at this time.  

## 2016-03-31 LAB — BASIC METABOLIC PANEL
Anion gap: 7 (ref 5–15)
BUN: 21 mg/dL — AB (ref 6–20)
CALCIUM: 8.4 mg/dL — AB (ref 8.9–10.3)
CHLORIDE: 101 mmol/L (ref 101–111)
CO2: 30 mmol/L (ref 22–32)
CREATININE: 1.31 mg/dL — AB (ref 0.61–1.24)
GFR, EST NON AFRICAN AMERICAN: 54 mL/min — AB (ref >60)
Glucose, Bld: 108 mg/dL — ABNORMAL HIGH (ref 65–99)
Potassium: 3.7 mmol/L (ref 3.5–5.1)
SODIUM: 138 mmol/L (ref 135–145)

## 2016-03-31 LAB — GLUCOSE, CAPILLARY
GLUCOSE-CAPILLARY: 99 mg/dL (ref 65–99)
Glucose-Capillary: 232 mg/dL — ABNORMAL HIGH (ref 65–99)
Glucose-Capillary: 123 mg/dL — ABNORMAL HIGH (ref 65–99)
Glucose-Capillary: 140 mg/dL — ABNORMAL HIGH (ref 65–99)

## 2016-03-31 MED ORDER — IPRATROPIUM-ALBUTEROL 0.5-2.5 (3) MG/3ML IN SOLN
3.0000 mL | Freq: Two times a day (BID) | RESPIRATORY_TRACT | Status: DC
  Administered 2016-03-31 – 2016-04-02 (×4): 3 mL via RESPIRATORY_TRACT
  Filled 2016-03-31 (×4): qty 3

## 2016-03-31 MED ORDER — ZOLPIDEM TARTRATE 5 MG PO TABS
5.0000 mg | ORAL_TABLET | Freq: Every evening | ORAL | Status: DC | PRN
  Administered 2016-03-31: 5 mg via ORAL
  Filled 2016-03-31: qty 1

## 2016-03-31 NOTE — Progress Notes (Signed)
PROGRESS NOTE    Leroy [name]  WUJ:[id number] DOB: 1945-07-14 DOA: 03/25/2016 PCP: [name], MD    Brief Narrative:  70 year old male with history of severe cardiomyopathy (EF of 10-15%), recent NSTEMIwith cardiac cath (02/22/16), A. fib, CAD with history of CABG, bilateral BKA presented to the ED with bilateral knee swelling with skin weeping from his inner thighs.  and wheezing. He was rolling down a wheelchair ramp 2 days prior to admit and his wheelchair flipped over suffering superficial skin abrasions on his stumps. Patient was hospitalized November 2017 for NSTEMI and underwent cardiac cath showing occluded LIMA graft to LAD, occluded native LAD at the ostium which was opened with balloon angioplasty. Native RCA as well as the vein graft to RCA was occluded. Patient seen recently in the office as a hospital follow-up and increased his metoprolol dose due to hypertension and tachycardia. Patient was seen by his PCP 2 days prior to admission for cough and wheezing and was given a persistent for a Z-Pak, albuterol and Tessalon Perles. He then presented to the ED later that day with a fall with pain in his legs and shortness of breath. X-rays were negative for any injury.There was difficulty obtaining IV access. Therefore, the patient was initially treated with oral furosemide. Cardiology was consulted, and PICC line was requested.  Cardiology states pt is not candidate for any further intervention nor milrinone and has signed off.   Assessment & Plan:   Principal Problem:   Acute on chronic combined systolic and diastolic heart failure (HCC) Active Problems:   ATRIAL FIBRILLATION, PAROXYSMAL   S/P BKA (below knee amputation) bilateral (HCC)   Chronic systolic heart failure (HCC)   S/P bilateral BKA (below knee amputation) (HCC)   Stroke (cerebrum) (HCC)   Diabetes mellitus type 2 with peripheral artery disease (HCC)   Coronary artery disease involving coronary bypass graft of  native heart with angina pectoris (HCC)   Essential hypertension   Acute CHF (congestive heart failure) (HCC)   Acute pulmonary edema (HCC)   Acute kidney injury (HCC)  Acute onChronic systolic and diastolic heart failure (HCC) -95/62--ZHYQ place--resumed IV lasix -Good urine output over the past 24 hours with a urine output of 2.650 L after increased dose of IV Lasix. Patient is - 5.425 L during this hospitalization. -Increased IV lasix 80 mg bid -Continue duonebs -Hold losartan given acute kidney injury. -Decreased metoprolol dose due to soft/low blood pressures -03/22/2016 office weight 277 pounds -03/25/2016 admission weight 288 pounds - 03/29/2016 current weight 283 pounds. - 03/30/2016 weight 281 pounds -03/31/2016 weight of 279 pounds -Appreciate cardiology--pt is not candidate for any further intervention nor milrinone and has signed off.  Acute respiratory failure with hypoxia -Secondary to decompensated CHF -Continue bronchodilators--patient has a degree of bronchospasm -Presently stable on 2 L -Patient has been placed on IV Lasix with good diuresis with a urine output of 2.650 L over the past 24 hours. Patient is currently -5.425 L during this hospitalization. Increased Lasix to 80 mg IV every 12 hours. Continue metoprolol. Follow.  Acute kidney injury Possibly hypoperfusion with CHF. Monitor with Lasix -Holding ARB -Suspect cardiorenal syndrome -Baseline creatinine 1.0-1.3 -Renal function improving with diuresis. Follow. -will have to tolerate slight worsen renal function for improved respiratory status  Elevated troponins No anginal symptoms. Suspect demand ischemia with acute CHF. Monitor on telemetry.  Paroxysmal A. fib Rate controlled. Continue metoprolol for rate control and Xarelto for anticoagulation. -CHADSVASc =5  Diabetes mellitus type 2 with peripheral artery  disease (HCC) Status post bilateral BKA.  -increased Lantus to 25 units at  hs -continue Novolog SSI - Continue meal coverage insulin 3 units with meals -02/22/16--A1c--10.6  Coronary artery disease involving coronary bypass graft of native heart with angina pectoris (HCC) -Continue Plavix, beta blocker and statin. -02/22/16 Cath--occluded LIMA to LAD graft, PTCA of native LAD with marginal results; prox LAD was opened but still significantly diseased and there was no flow to the distal LAD  also has occluded RCA and occluded OM1 and OM2   Acute bronchitis - s/p 5 days of azithromycin.     DVT prophylaxis: xarelto Code Status: Full Family Communication: Updated patient. No family at bedside. Disposition Plan: SNF when medically stable with improvement in respiratory status and weight back to baseline.   Consultants:   Cardiology: Dr.Nahser 03/25/2016  Procedures:   PICC 03/27/2016  Chest x-ray 03/25/2016  Antimicrobials:   Azithromycin 03/25/2016>>>> 03/28/2016     Subjective: Patient states shortness of breath improving. No chest pain. Patient complaining of insomnia.   Objective: Vitals:   03/31/16 0627 03/31/16 0827 03/31/16 1020 03/31/16 1402  BP: (!) 117/98  130/80 136/79  Pulse: 100  (!) 101 99  Resp: 18   18  Temp: 98.1 F (36.7 C)   98.9 F (37.2 C)  TempSrc: Oral   Oral  SpO2: 98% 100%  98%  Weight:      Height:        Intake/Output Summary (Last 24 hours) at 03/31/16 1954 Last data filed at 03/31/16 0600  Gross per 24 hour  Intake                0 ml  Output             1300 ml  Net            -1300 ml   Filed Weights   03/29/16 0645 03/30/16 0646 03/31/16 0500  Weight: 128.5 kg (283 lb 6.4 oz) 127.8 kg (281 lb 11.2 oz) 127 kg (279 lb 14.4 oz)    Examination:  General exam: Appears calm and comfortable  Respiratory system: Bibasilar crackles improving. Cardiovascular system: S1 & S2 heard, RRR. No JVD, murmurs, rubs, gallops or clicks. No pedal edema. Gastrointestinal system: Abdomen is nondistended,  soft and nontender. No organomegaly or masses felt. Normal bowel sounds heard. Central nervous system: Alert and oriented. No focal neurological deficits. Extremities: Bilateral BKA's. Skin: No rashes, lesions or ulcers Psychiatry: Judgement and insight appear normal. Mood & affect appropriate.     Data Reviewed: I have personally reviewed following labs and imaging studies  CBC:  Recent Labs Lab 03/25/16 0605  WBC 6.3  NEUTROABS 2.9  HGB 14.1  HCT 42.8  MCV 89.0  PLT 183   Basic Metabolic Panel:  Recent Labs Lab 03/27/16 0350 03/28/16 0500 03/29/16 0417 03/30/16 0421 03/31/16 0517  NA 135 136 135 138 138  K 4.2 4.1 3.9 3.8 3.7  CL 103 102 101 102 101  CO2 23 27 28 27 30   GLUCOSE 199* 137* 197* 158* 108*  BUN 27* 30* 28* 26* 21*  CREATININE 1.62* 1.62* 1.59* 1.43* 1.31*  CALCIUM 8.2* 8.2* 8.4* 8.4* 8.4*   GFR: Estimated Creatinine Clearance: 62.9 mL/min (by C-G formula based on SCr of 1.31 mg/dL (H)). Liver Function Tests: No results for input(s): AST, ALT, ALKPHOS, BILITOT, PROT, ALBUMIN in the last 168 hours. No results for input(s): LIPASE, AMYLASE in the last 168 hours. No results for input(s): AMMONIA in  the last 168 hours. Coagulation Profile: No results for input(s): INR, PROTIME in the last 168 hours. Cardiac Enzymes:  Recent Labs Lab 03/25/16 1320 03/25/16 1821 03/26/16 0041  TROPONINI 0.09* 0.09* 0.08*   BNP (last 3 results) No results for input(s): PROBNP in the last 8760 hours. HbA1C: No results for input(s): HGBA1C in the last 72 hours. CBG:  Recent Labs Lab 03/30/16 1639 03/30/16 2154 03/31/16 0741 03/31/16 1138 03/31/16 1639  GLUCAP 153* 149* 99 232* 123*   Lipid Profile: No results for input(s): CHOL, HDL, LDLCALC, TRIG, CHOLHDL, LDLDIRECT in the last 72 hours. Thyroid Function Tests: No results for input(s): TSH, T4TOTAL, FREET4, T3FREE, THYROIDAB in the last 72 hours. Anemia Panel: No results for input(s): VITAMINB12,  FOLATE, FERRITIN, TIBC, IRON, RETICCTPCT in the last 72 hours. Sepsis Labs: No results for input(s): PROCALCITON, LATICACIDVEN in the last 168 hours.  Recent Results (from the past 240 hour(s))  MRSA PCR Screening     Status: Abnormal   Collection Time: 03/25/16  9:27 AM  Result Value Ref Range Status   MRSA by PCR POSITIVE (A) NEGATIVE Final    Comment:        The GeneXpert MRSA Assay (FDA approved for NASAL specimens only), is one component of a comprehensive MRSA colonization surveillance program. It is not intended to diagnose MRSA infection nor to guide or monitor treatment for MRSA infections. RESULT CALLED TO, READ BACK BY AND VERIFIED WITH: MAYES,S @ 1656 ON [id number] BY POTEAT,S          Radiology Studies: No results found.      Scheduled Meds: . clopidogrel  75 mg Oral Daily  . furosemide  80 mg Intravenous BID  . insulin aspart  0-15 Units Subcutaneous TID WC  . insulin aspart  0-5 Units Subcutaneous QHS  . insulin aspart  3 Units Subcutaneous TID WC  . insulin glargine  25 Units Subcutaneous QHS  . ipratropium-albuterol  3 mL Nebulization BID  . metoprolol succinate  25 mg Oral BID  . potassium chloride  20 mEq Oral Daily  . rivaroxaban  20 mg Oral Q supper  . rosuvastatin  20 mg Oral Daily  . sodium chloride flush  10-40 mL Intracatheter Q12H  . sodium chloride flush  3 mL Intravenous Q12H   Continuous Infusions:   LOS: 6 days    Time spent: 40 minutes    Leroy Kitamura, MD [facility] Pager [phone]  If 7PM-7AM, please contact night-coverage [email or web address] Password [email or web address] 03/31/2016, 7:54 PM

## 2016-03-31 NOTE — Progress Notes (Signed)
Checked with patient x3 this shift. With each visit, he has declines nocturnal CPAP. Equipment remains at the bedside. RT will continue to follow.

## 2016-03-31 NOTE — Progress Notes (Signed)
Pt without complaints through out today. Cont with plan of care

## 2016-04-01 LAB — BASIC METABOLIC PANEL
Anion gap: 7 (ref 5–15)
BUN: 19 mg/dL (ref 6–20)
CHLORIDE: 104 mmol/L (ref 101–111)
CO2: 30 mmol/L (ref 22–32)
CREATININE: 1.26 mg/dL — AB (ref 0.61–1.24)
Calcium: 8.6 mg/dL — ABNORMAL LOW (ref 8.9–10.3)
GFR calc non Af Amer: 57 mL/min — ABNORMAL LOW (ref >60)
Glucose, Bld: 83 mg/dL (ref 65–99)
POTASSIUM: 3.5 mmol/L (ref 3.5–5.1)
SODIUM: 141 mmol/L (ref 135–145)

## 2016-04-01 LAB — GLUCOSE, CAPILLARY
GLUCOSE-CAPILLARY: 114 mg/dL — AB (ref 65–99)
GLUCOSE-CAPILLARY: 230 mg/dL — AB (ref 65–99)
GLUCOSE-CAPILLARY: 216 mg/dL — AB (ref 65–99)
Glucose-Capillary: 206 mg/dL — ABNORMAL HIGH (ref 65–99)

## 2016-04-01 MED ORDER — POTASSIUM CHLORIDE CRYS ER 20 MEQ PO TBCR
40.0000 meq | EXTENDED_RELEASE_TABLET | Freq: Once | ORAL | Status: AC
  Administered 2016-04-01: 40 meq via ORAL
  Filled 2016-04-01: qty 2

## 2016-04-01 NOTE — Progress Notes (Signed)
PROGRESS NOTE    Leroy GriffithsWilliam J [name]  ZOX:[id number] DOB: 12/11/1945 DOA: 03/25/2016 PCP: [name], MD    Brief Narrative:  70 year old male with history of severe cardiomyopathy (EF of 10-15%), recent NSTEMIwith cardiac cath (02/22/16), A. fib, CAD with history of CABG, bilateral BKA presented to the ED with bilateral knee swelling with skin weeping from his inner thighs.  and wheezing. He was rolling down a wheelchair ramp 2 days prior to admit and his wheelchair flipped over suffering superficial skin abrasions on his stumps. Patient was hospitalized November 2017 for NSTEMI and underwent cardiac cath showing occluded LIMA graft to LAD, occluded native LAD at the ostium which was opened with balloon angioplasty. Native RCA as well as the vein graft to RCA was occluded. Patient seen recently in the office as a hospital follow-up and increased his metoprolol dose due to hypertension and tachycardia. Patient was seen by his PCP 2 days prior to admission for cough and wheezing and was given a persistent for a Z-Pak, albuterol and Tessalon Perles. He then presented to the ED later that day with a fall with pain in his legs and shortness of breath. X-rays were negative for any injury.There was difficulty obtaining IV access. Therefore, the patient was initially treated with oral furosemide. Cardiology was consulted, and PICC line was requested.  Cardiology states pt is not candidate for any further intervention nor milrinone and has signed off.   Assessment & Plan:   Principal Problem:   Acute on chronic combined systolic and diastolic heart failure (HCC) Active Problems:   ATRIAL FIBRILLATION, PAROXYSMAL   S/P BKA (below knee amputation) bilateral (HCC)   Chronic systolic heart failure (HCC)   S/P bilateral BKA (below knee amputation) (HCC)   Stroke (cerebrum) (HCC)   Diabetes mellitus type 2 with peripheral artery disease (HCC)   Coronary artery disease involving coronary bypass graft of  native heart with angina pectoris (HCC)   Essential hypertension   Acute CHF (congestive heart failure) (HCC)   Acute pulmonary edema (HCC)   Acute kidney injury (HCC)  Acute onChronic systolic and diastolic heart failure (HCC) -81/19--JYNW12/20--PICC place--resumed IV lasix -Good urine output over the past 24 hours with a urine output of 1.8 L after increased dose of IV Lasix. Patient is - 6.745 L during this hospitalization. -Increased IV lasix 80 mg bid -Continue duonebs -Hold losartan given acute kidney injury. -Decreased metoprolol dose due to soft/low blood pressures -03/22/2016 office weight 277 pounds -03/25/2016 admission weight 288 pounds - 03/29/2016 current weight 283 pounds. - 03/30/2016 weight 281 pounds -03/31/2016 weight of 279 pounds -04/01/2016 278 pounds. -Appreciate cardiology--pt is not candidate for any further intervention nor milrinone and has signed off.  Acute respiratory failure with hypoxia -Secondary to decompensated CHF -Continue bronchodilators--patient has a degree of bronchospasm -Presently stable on 2 L -Patient has been placed on IV Lasix with good diuresis with a urine output of 1.8 L over the past 24 hours. Patient is currently -6.745 L during this hospitalization. Increased Lasix to 80 mg IV every 12 hours. Continue metoprolol. Follow.  Acute kidney injury Possibly hypoperfusion with CHF. Monitor with Lasix -Holding ARB -Suspect cardiorenal syndrome -Baseline creatinine 1.0-1.3 -Renal function improving with diuresis. Follow. -May have to tolerate slight worsen renal function for improved respiratory status  Elevated troponins No anginal symptoms. Suspect demand ischemia with acute CHF. Monitor on telemetry.  Paroxysmal A. fib Rate controlled. Continue metoprolol for rate control and Xarelto for anticoagulation. -CHADSVASc =5  Diabetes mellitus type 2  with peripheral artery disease (HCC) Status post bilateral BKA.  -increased Lantus to 25  units at hs -continue Novolog SSI - Continue meal coverage insulin 3 units with meals -02/22/16--A1c--10.6  Coronary artery disease involving coronary bypass graft of native heart with angina pectoris (HCC)/ischemic cardiomyopathy -Continue Plavix, beta blocker and statin. -02/22/16 Cath--occluded LIMA to LAD graft, PTCA of native LAD with marginal results; prox LAD was opened but still significantly diseased and there was no flow to the distal LAD  also has occluded RCA and occluded OM1 and OM2. Outpatient follow-up with cardiology. Per cardiology if repeat echo to 3 months shows no improvement with LV function patient may require ICD placement.  Acute bronchitis - s/p 5 days of azithromycin.     DVT prophylaxis: xarelto Code Status: Full Family Communication: Updated patient. No family at bedside. Disposition Plan: SNF when medically stable with improvement in respiratory status and weight back to baseline.   Consultants:   Cardiology: Dr.Nahser 03/25/2016  Procedures:   PICC 03/27/2016  Chest x-ray 03/25/2016  Antimicrobials:   Azithromycin 03/25/2016>>>> 03/28/2016     Subjective: Patient sitting up in bed. States shortness of breath has improved. No chest pain. Patient states slept well last night better than he has in the past 3 months.   Objective: Vitals:   03/31/16 2115 03/31/16 2143 04/01/16 0019 04/01/16 0508  BP:  110/83  134/67  Pulse:  96  76  Resp:  16  18  Temp:  99.3 F (37.4 C) 98.8 F (37.1 C) 98 F (36.7 C)  TempSrc:  Oral Axillary Oral  SpO2: 93% 100%  100%  Weight:    126.1 kg (278 lb)  Height:        Intake/Output Summary (Last 24 hours) at 04/01/16 1300 Last data filed at 04/01/16 1000  Gross per 24 hour  Intake              240 ml  Output             1800 ml  Net            -1560 ml   Filed Weights   03/30/16 0646 03/31/16 0500 04/01/16 0508  Weight: 127.8 kg (281 lb 11.2 oz) 127 kg (279 lb 14.4 oz) 126.1 kg (278 lb)     Examination:  General exam: Appears calm and comfortable  Respiratory system: Bibasilar crackles improving. Cardiovascular system: S1 & S2 heard, RRR. No JVD, murmurs, rubs, gallops or clicks. No pedal edema. Gastrointestinal system: Abdomen is nondistended, soft and nontender. No organomegaly or masses felt. Normal bowel sounds heard. Central nervous system: Alert and oriented. No focal neurological deficits. Extremities: Bilateral BKA's. Skin: No rashes, lesions or ulcers Psychiatry: Judgement and insight appear normal. Mood & affect appropriate.     Data Reviewed: I have personally reviewed following labs and imaging studies  CBC: No results for input(s): WBC, NEUTROABS, HGB, HCT, MCV, PLT in the last 168 hours. Basic Metabolic Panel:  Recent Labs Lab 03/28/16 0500 03/29/16 0417 03/30/16 0421 03/31/16 0517 04/01/16 0406  NA 136 135 138 138 141  K 4.1 3.9 3.8 3.7 3.5  CL 102 101 102 101 104  CO2 27 28 27 30 30   GLUCOSE 137* 197* 158* 108* 83  BUN 30* 28* 26* 21* 19  CREATININE 1.62* 1.59* 1.43* 1.31* 1.26*  CALCIUM 8.2* 8.4* 8.4* 8.4* 8.6*   GFR: Estimated Creatinine Clearance: 65.1 mL/min (by C-G formula based on SCr of 1.26 mg/dL (H)). Liver Function Tests: No  results for input(s): AST, ALT, ALKPHOS, BILITOT, PROT, ALBUMIN in the last 168 hours. No results for input(s): LIPASE, AMYLASE in the last 168 hours. No results for input(s): AMMONIA in the last 168 hours. Coagulation Profile: No results for input(s): INR, PROTIME in the last 168 hours. Cardiac Enzymes:  Recent Labs Lab 03/25/16 1320 03/25/16 1821 03/26/16 0041  TROPONINI 0.09* 0.09* 0.08*   BNP (last 3 results) No results for input(s): PROBNP in the last 8760 hours. HbA1C: No results for input(s): HGBA1C in the last 72 hours. CBG:  Recent Labs Lab 03/31/16 1138 03/31/16 1639 03/31/16 2208 04/01/16 0732 04/01/16 1150  GLUCAP 232* 123* 140* 114* 230*   Lipid Profile: No results for  input(s): CHOL, HDL, LDLCALC, TRIG, CHOLHDL, LDLDIRECT in the last 72 hours. Thyroid Function Tests: No results for input(s): TSH, T4TOTAL, FREET4, T3FREE, THYROIDAB in the last 72 hours. Anemia Panel: No results for input(s): VITAMINB12, FOLATE, FERRITIN, TIBC, IRON, RETICCTPCT in the last 72 hours. Sepsis Labs: No results for input(s): PROCALCITON, LATICACIDVEN in the last 168 hours.  Recent Results (from the past 240 hour(s))  MRSA PCR Screening     Status: Abnormal   Collection Time: 03/25/16  9:27 AM  Result Value Ref Range Status   MRSA by PCR POSITIVE (A) NEGATIVE Final    Comment:        The GeneXpert MRSA Assay (FDA approved for NASAL specimens only), is one component of a comprehensive MRSA colonization surveillance program. It is not intended to diagnose MRSA infection nor to guide or monitor treatment for MRSA infections. RESULT CALLED TO, READ BACK BY AND VERIFIED WITH: MAYES,S @ 1656 ON [id number] BY POTEAT,S          Radiology Studies: No results found.      Scheduled Meds: . clopidogrel  75 mg Oral Daily  . furosemide  80 mg Intravenous BID  . insulin aspart  0-15 Units Subcutaneous TID WC  . insulin aspart  0-5 Units Subcutaneous QHS  . insulin aspart  3 Units Subcutaneous TID WC  . insulin glargine  25 Units Subcutaneous QHS  . ipratropium-albuterol  3 mL Nebulization BID  . metoprolol succinate  25 mg Oral BID  . potassium chloride  20 mEq Oral Daily  . rivaroxaban  20 mg Oral Q supper  . rosuvastatin  20 mg Oral Daily  . sodium chloride flush  10-40 mL Intracatheter Q12H  . sodium chloride flush  3 mL Intravenous Q12H   Continuous Infusions:   LOS: 7 days    Time spent: 40 minutes    Lucianna Ostlund, MD [facility] Pager [phone]  If 7PM-7AM, please contact night-coverage [email or web address] Password [email or web address] 04/01/2016, 1:00 PM

## 2016-04-02 ENCOUNTER — Telehealth: Payer: Self-pay | Admitting: Cardiology

## 2016-04-02 DIAGNOSIS — R279 Unspecified lack of coordination: Secondary | ICD-10-CM | POA: Diagnosis not present

## 2016-04-02 DIAGNOSIS — R062 Wheezing: Secondary | ICD-10-CM | POA: Diagnosis not present

## 2016-04-02 DIAGNOSIS — I25709 Atherosclerosis of coronary artery bypass graft(s), unspecified, with unspecified angina pectoris: Secondary | ICD-10-CM | POA: Diagnosis not present

## 2016-04-02 DIAGNOSIS — I4891 Unspecified atrial fibrillation: Secondary | ICD-10-CM | POA: Diagnosis not present

## 2016-04-02 DIAGNOSIS — R609 Edema, unspecified: Secondary | ICD-10-CM | POA: Diagnosis not present

## 2016-04-02 DIAGNOSIS — I5043 Acute on chronic combined systolic (congestive) and diastolic (congestive) heart failure: Secondary | ICD-10-CM | POA: Diagnosis not present

## 2016-04-02 DIAGNOSIS — F339 Major depressive disorder, recurrent, unspecified: Secondary | ICD-10-CM | POA: Diagnosis not present

## 2016-04-02 DIAGNOSIS — I5022 Chronic systolic (congestive) heart failure: Secondary | ICD-10-CM | POA: Diagnosis not present

## 2016-04-02 DIAGNOSIS — E119 Type 2 diabetes mellitus without complications: Secondary | ICD-10-CM | POA: Diagnosis not present

## 2016-04-02 DIAGNOSIS — E1151 Type 2 diabetes mellitus with diabetic peripheral angiopathy without gangrene: Secondary | ICD-10-CM | POA: Diagnosis not present

## 2016-04-02 DIAGNOSIS — E1152 Type 2 diabetes mellitus with diabetic peripheral angiopathy with gangrene: Secondary | ICD-10-CM | POA: Diagnosis not present

## 2016-04-02 DIAGNOSIS — J40 Bronchitis, not specified as acute or chronic: Secondary | ICD-10-CM | POA: Diagnosis not present

## 2016-04-02 DIAGNOSIS — I480 Paroxysmal atrial fibrillation: Secondary | ICD-10-CM | POA: Diagnosis not present

## 2016-04-02 DIAGNOSIS — N179 Acute kidney failure, unspecified: Secondary | ICD-10-CM | POA: Diagnosis not present

## 2016-04-02 DIAGNOSIS — M6281 Muscle weakness (generalized): Secondary | ICD-10-CM | POA: Diagnosis not present

## 2016-04-02 DIAGNOSIS — W19XXXA Unspecified fall, initial encounter: Secondary | ICD-10-CM | POA: Diagnosis not present

## 2016-04-02 DIAGNOSIS — I739 Peripheral vascular disease, unspecified: Secondary | ICD-10-CM | POA: Diagnosis not present

## 2016-04-02 DIAGNOSIS — I2510 Atherosclerotic heart disease of native coronary artery without angina pectoris: Secondary | ICD-10-CM | POA: Diagnosis not present

## 2016-04-02 DIAGNOSIS — R2689 Other abnormalities of gait and mobility: Secondary | ICD-10-CM | POA: Diagnosis not present

## 2016-04-02 DIAGNOSIS — I6789 Other cerebrovascular disease: Secondary | ICD-10-CM | POA: Diagnosis not present

## 2016-04-02 DIAGNOSIS — I5040 Unspecified combined systolic (congestive) and diastolic (congestive) heart failure: Secondary | ICD-10-CM | POA: Diagnosis not present

## 2016-04-02 DIAGNOSIS — R5381 Other malaise: Secondary | ICD-10-CM | POA: Diagnosis not present

## 2016-04-02 DIAGNOSIS — M79606 Pain in leg, unspecified: Secondary | ICD-10-CM | POA: Diagnosis not present

## 2016-04-02 DIAGNOSIS — I25718 Atherosclerosis of autologous vein coronary artery bypass graft(s) with other forms of angina pectoris: Secondary | ICD-10-CM | POA: Diagnosis not present

## 2016-04-02 DIAGNOSIS — J9601 Acute respiratory failure with hypoxia: Secondary | ICD-10-CM | POA: Diagnosis not present

## 2016-04-02 DIAGNOSIS — L97119 Non-pressure chronic ulcer of right thigh with unspecified severity: Secondary | ICD-10-CM | POA: Diagnosis not present

## 2016-04-02 DIAGNOSIS — H409 Unspecified glaucoma: Secondary | ICD-10-CM | POA: Diagnosis not present

## 2016-04-02 DIAGNOSIS — R531 Weakness: Secondary | ICD-10-CM | POA: Diagnosis not present

## 2016-04-02 LAB — CBC WITH DIFFERENTIAL/PLATELET
Basophils Absolute: 0 10*3/uL (ref 0.0–0.1)
Basophils Relative: 0 %
EOS ABS: 0.2 10*3/uL (ref 0.0–0.7)
EOS PCT: 3 %
HCT: 43.9 % (ref 39.0–52.0)
HEMOGLOBIN: 14.4 g/dL (ref 13.0–17.0)
LYMPHS ABS: 2.6 10*3/uL (ref 0.7–4.0)
LYMPHS PCT: 34 %
MCH: 29.3 pg (ref 26.0–34.0)
MCHC: 32.8 g/dL (ref 30.0–36.0)
MCV: 89.2 fL (ref 78.0–100.0)
MONOS PCT: 13 %
Monocytes Absolute: 1 10*3/uL (ref 0.1–1.0)
Neutro Abs: 3.8 10*3/uL (ref 1.7–7.7)
Neutrophils Relative %: 50 %
Platelets: 219 10*3/uL (ref 150–400)
RBC: 4.92 MIL/uL (ref 4.22–5.81)
RDW: 14.6 % (ref 11.5–15.5)
WBC: 7.5 10*3/uL (ref 4.0–10.5)

## 2016-04-02 LAB — GLUCOSE, CAPILLARY
GLUCOSE-CAPILLARY: 78 mg/dL (ref 65–99)
GLUCOSE-CAPILLARY: 130 mg/dL — AB (ref 65–99)

## 2016-04-02 LAB — BASIC METABOLIC PANEL
Anion gap: 9 (ref 5–15)
BUN: 17 mg/dL (ref 6–20)
CHLORIDE: 101 mmol/L (ref 101–111)
CO2: 29 mmol/L (ref 22–32)
Calcium: 8.6 mg/dL — ABNORMAL LOW (ref 8.9–10.3)
Creatinine, Ser: 1.35 mg/dL — ABNORMAL HIGH (ref 0.61–1.24)
GFR calc Af Amer: >60 mL/min (ref >60)
GFR calc non Af Amer: 52 mL/min — ABNORMAL LOW (ref >60)
Glucose, Bld: 71 mg/dL (ref 65–99)
POTASSIUM: 3.7 mmol/L (ref 3.5–5.1)
Sodium: 139 mmol/L (ref 135–145)

## 2016-04-02 MED ORDER — FUROSEMIDE 80 MG PO TABS: 80.0000 mg | ORAL_TABLET | Freq: Two times a day (BID) | ORAL | 3 refills | Status: DC

## 2016-04-02 MED ORDER — POTASSIUM CHLORIDE CRYS ER 20 MEQ PO TBCR
40.0000 meq | EXTENDED_RELEASE_TABLET | Freq: Every day | ORAL | Status: DC
  Administered 2016-04-02: 40 meq via ORAL
  Filled 2016-04-02: qty 2

## 2016-04-02 MED ORDER — METOPROLOL SUCCINATE ER 25 MG PO TB24: 25.0000 mg | ORAL_TABLET | Freq: Two times a day (BID) | ORAL | 0 refills | Status: DC

## 2016-04-02 MED ORDER — INSULIN GLARGINE 100 UNIT/ML SOLOSTAR PEN: 25.0000 [IU] | PEN_INJECTOR | Freq: Every day | SUBCUTANEOUS | 3 refills | Status: DC

## 2016-04-02 MED ORDER — FUROSEMIDE 40 MG PO TABS
80.0000 mg | ORAL_TABLET | Freq: Two times a day (BID) | ORAL | Status: DC
  Administered 2016-04-02: 80 mg via ORAL
  Filled 2016-04-02: qty 2

## 2016-04-02 MED ORDER — POTASSIUM CHLORIDE CRYS ER 20 MEQ PO TBCR: 40.0000 meq | EXTENDED_RELEASE_TABLET | Freq: Every day | ORAL | 0 refills | Status: DC

## 2016-04-02 MED ORDER — INSULIN ASPART 100 UNIT/ML ~~LOC~~ SOLN: 3.0000 [IU] | Freq: Three times a day (TID) | SUBCUTANEOUS | 0 refills | Status: DC

## 2016-04-02 MED ORDER — INSULIN ASPART 100 UNIT/ML ~~LOC~~ SOLN: 0.0000 [IU] | Freq: Three times a day (TID) | SUBCUTANEOUS | 11 refills | Status: DC

## 2016-04-02 NOTE — Care Management Important Message (Signed)
Important Message  Patient Details IM Letter given to Sarah/Case Manager to present to Patient Name: Leroy GriffithsWilliam J [name] MRN: [id number] Date of Birth: [birth year]   Medicare Important Message Given:  Yes    Caren MacadamFuller, Avriana Joo 04/02/2016, 10:59 AMImportant Message  Patient Details  Name: Leroy GriffithsWilliam J [name] MRN: [id number] Date of Birth: [birth year]   Medicare Important Message Given:  Yes    Caren MacadamFuller, Brycin Kille 04/02/2016, 10:59 AM

## 2016-04-02 NOTE — Discharge Summary (Signed)
Physician Discharge Summary  CLAIR ALFIERI QBH:[id number] DOB: 1945/05/02 DOA: 03/25/2016  PCP: [name], MD  Admit date: 03/25/2016 Discharge date: 04/02/2016  Time spent: 65 minutes  Recommendations for Outpatient Follow-up:  1. Follow-up with M.D. at skilled nursing facility. Patient will need a basic metabolic profile done to follow-up on electrolytes and renal function in 1 week. 2. Follow-up Kerin Ransom, [place] cardiology on 04/11/2016.   Discharge Diagnoses:  Principal Problem:   Acute on chronic combined systolic and diastolic heart failure (HCC) Active Problems:   ATRIAL FIBRILLATION, PAROXYSMAL   S/P BKA (below knee amputation) bilateral (HCC)   Chronic systolic heart failure (HCC)   S/P bilateral BKA (below knee amputation) ([place])   Stroke (cerebrum) (HCC)   Diabetes mellitus type 2 with peripheral artery disease ([place])   Coronary artery disease involving coronary bypass graft of native heart with angina pectoris [facility])   Essential hypertension   Acute CHF (congestive heart failure) ([place])   Acute pulmonary edema ([place])   Acute kidney injury ([place])   Discharge Condition: Stable and improved  Diet recommendation: Heart healthy  Filed Weights   03/31/16 0500 04/01/16 0508 04/02/16 0553  Weight: 127 kg (279 lb 14.4 oz) 126.1 kg (278 lb) 126.1 kg (278 lb 1.6 oz)    History of present illness:  Per Dr Ninfa Meeker is a 70 y.o. male with medical history significant of systolic CHF (last Echo 1 month prior showed EF of 10-15%), recent NSTEMI, atrial fibrillation, CAD with bypass graft, bilateral BKA who presented to the ED with bilateral knee swelling, skin weeping and wheezing.  Patient was previously admitted in November with NSTEMI and during that hospitalization underwent a Cardiac cath per Dr. Irish Lack with occluded LIMA graft to LAD. The native LAD was also occluded at the ostium. The native LAD was opened with balloon angioplasty. Patent vein graft to several  OM branches. The native RCA is occluded and the vein graft to the RCA is occluded (known from last cath in 2011).  Since that admission patient has been seen by cardiology as well as by his PCP.  He was seen at the cardiologist office on 12/15 and was instructed to increase his metoprolol given his tachycardia and HTN.   Two days ago patient presented first to his PCP for evaluation of cough and wheezing (wheezing though is chronic for him and he has had it for years).  He was given a prescription for a Zpack, albuterol and tessalon perles.  Later that day he presented to the ED for a fall, knee pain and shortness of breath. He was evaluated and xrays of knees bilaterally showed no fracture.   Patient stated after being evaluated in the ED 2 days ago he noticed the skin on the inner part of his thighs was starting to open and ooze.  He mentions that the drainage has not been terrible but enough that he has noticed.  He is still having a cough that is occasionally productive. He denies shortness of breath, chest pain, fever, diarrhea, and weight gain.  He says he weighs himself daily however he could not tell me what his weight is.  He is having wheezing but as previously mentioned this is chronic for him. Of note, patient weight at discharge on 11/19 was 127.1kg and today his weight is entered as 125.6kg.  ED Course: patient seen by EDP.  BNP of 1098.2, Chest xray of Cardiomegaly with new small bilateral pleural effusions and bibasilar airspace opacities compatible  with edema. Superimposed infection not excluded in the left lower lobe. IV Lasix of 59m IV x 1 given. Cardiology consulted by EDP- will see patient.  Creatinine of 1.46 (baseline appears to be 1.05 last month prior to hospitalization for catheterization).    Hospital Course:  Acute onChronic systolic and diastolic heart failure ([place] -Patient was admitted with acute respiratory failure with hypoxia felt to be secondary to acute on chronic  systolic and diastolic heart failure. Cardiology was consulted and followed the patient during the hospitalization. Patient was initially placed on oral Lasix however no significant improvement. -12/20--PICC placed--resumed IV lasix at 60 mg twice daily which was subsequently increased to 80 mg IV twice daily.  -Patient improved clinically during the hospitalization and diuresed well. -Patient was -10.056 L during the hospitalization with good urine output. -Patient's beta blocker dose was adjusted/decreased due to soft low blood pressures and to allow for diuresis. Patient's ARB discontinued secondary to acute kidney injury. -03/22/2016 office weight 277 pounds -03/25/2016 admission weight 288 pounds - 03/29/2016 current weight 283 pounds. - 03/30/2016 weight 281 pounds -03/31/2016 weight of 279 pounds -04/01/2016 278 pounds. 04/02/2016 278 pounds -Patient was seen in consultation by cardiology who felt patient was not a candidate for any further intervention non-nail neuro and signed off. -Patient improved clinically such that by day of discharge was satting 97% on room air. -Patient will be discharged to a skilled nursing facility on Lasix 80 mg twice daily as well as a lower dose of metoprolol. Patient's therapy has been discontinued. -Outpatient follow-up with cardiology.  Acute respiratory failure with hypoxia -Secondary to decompensated CHF -Patient was placed on IV Lasix as well as bronchodilators due to concern for bronchospasm and bronchitis. Patient received 5 day course of azithromycin. PICC line was placed and patient was placed on IV Lasix with good diuresis. Patient's oxygenation improved. Lasix was increased to 80 mg twice daily and patient was -10.056 L during the hospitalization. Patient's weight also improved and patient was down to 278 pounds. Patient improved clinically since that by day of discharge patient was sent in 97% on room air.  -Patient with discharged to a skilled  nursing facility on his oral diuretics. Patient will follow-up with cardiology as outpatient.   Acute kidney injury On admission patient was noted to have acute kidney injury with a creatinine went up as high as 1.62 felt to be likely secondary to hypoperfusion with CHF. -Patient's ARB was discontinue -Baseline creatinine 1.0-1.3 -Patient was placed on IV Lasix via PICC line as there was no significant improvement with his symptoms on oral Lasix. IV Lasix dose was adjusted and increased and beta blocker dose decreased. Patient was diuresed with 80 mg of IV Lasix twice daily with good urine output and improvement in renal function such that by day of discharge patient's creatinine was down to 1.35. Patient was subsequently switched to oral diuretics. Outpatient follow-up. -May have to tolerate slight worsen renal function for improved respiratory status  Elevated troponins No anginal symptoms. Suspect demand ischemia with acute CHF. Monitor on telemetry.  Paroxysmal A. fib Rate controlled on metoprolol during the hospitalization. Patient was maintained on home regimen of Xarelto for anticoagulation. -CHADSVASc =5  Diabetes mellitus type 2 with peripheral artery disease ([place] Status post bilateral BKA.  - patient's Lantus dose was adjusted during the hospitalization and patient's CBGs were controlled on Lantus 25 units daily at bedtime. Patient was also maintained on a sliding scale insulin as well as 3 units meal coverage insulin. -  02/22/16--A1c--10.6  Coronary artery disease involving coronary bypass graft of native heart with angina pectoris (HCC)/ischemic cardiomyopathy -Continued on Plavix, beta blocker and statin. -02/22/16 Cath--occluded LIMA to LAD graft, PTCA of native LAD with marginal results; prox LAD was opened but still significantly diseased and there was no flow to the distal LAD  also has occluded RCA and occluded OM1 and OM2. Outpatient follow-up with cardiology. Per  cardiology if repeat echo to 3 months shows no improvement with LV function patient may require ICD placement.  Acute bronchitis - s/p 5 days of azithromycin.   Procedures:  PICC 03/27/2016  Chest x-ray 03/25/2016  Consultations:  Cardiology: Dr.Nahser 03/25/2016   Discharge Exam: Vitals:   04/02/16 0553 04/02/16 1357  BP: (!) 96/52 (!) 112/50  Pulse: 87 96  Resp: 18 (!) 22  Temp: 98.3 F (36.8 C) 98.6 F (37 C)    General: NAD Cardiovascular: RRR Respiratory: CTAB  Discharge Instructions   Discharge Instructions    Diet - low sodium heart healthy    Complete by:  As directed    Increase activity slowly    Complete by:  As directed      Current Discharge Medication List    START taking these medications   Details  furosemide (LASIX) 80 MG tablet Take 1 tablet (80 mg total) by mouth 2 (two) times daily. Qty: 60 tablet, Refills: 3    potassium chloride SA (K-DUR,KLOR-CON) 20 MEQ tablet Take 2 tablets (40 mEq total) by mouth daily. Qty: 60 tablet, Refills: 0      CONTINUE these medications which have CHANGED   Details  !! insulin aspart (NOVOLOG) 100 UNIT/ML injection Inject 0-15 Units into the skin 3 (three) times daily with meals. Qty: 10 mL, Refills: 11    !! insulin aspart (NOVOLOG) 100 UNIT/ML injection Inject 3 Units into the skin 3 (three) times daily with meals. Qty: 10 mL, Refills: 0    Insulin Glargine (LANTUS) 100 UNIT/ML Solostar Pen Inject 25 Units into the skin daily at 10 pm. Qty: 15 mL, Refills: 3    metoprolol succinate (TOPROL-XL) 25 MG 24 hr tablet Take 1 tablet (25 mg total) by mouth 2 (two) times daily. Qty: 60 tablet, Refills: 0     !! - Potential duplicate medications found. Please discuss with provider.    CONTINUE these medications which have NOT CHANGED   Details  albuterol (PROVENTIL HFA;VENTOLIN HFA) 108 (90 Base) MCG/ACT inhaler Inhale 2 puffs into the lungs every 6 (six) hours as needed for wheezing or shortness of  breath. Qty: 1 Inhaler, Refills: 0    benzonatate (TESSALON) 100 MG capsule Take 1 capsule (100 mg total) by mouth 3 (three) times daily as needed. Qty: 20 capsule, Refills: 0    clopidogrel (PLAVIX) 75 MG tablet Take 1 tablet (75 mg total) by mouth daily. Qty: 30 tablet, Refills: 0    Insulin Pen Needle (B-D UF III MINI PEN NEEDLES) 31G X 5 MM MISC Use to inject insulin as directed. Qty: 270 each, Refills: 3    nitroGLYCERIN (NITROSTAT) 0.4 MG SL tablet Place 1 tablet (0.4 mg total) under the tongue every 5 (five) minutes as needed for chest pain. Qty: 30 tablet, Refills: 12    rivaroxaban (XARELTO) 20 MG TABS tablet Take 1 tablet (20 mg total) by mouth daily at 6 PM. Qty: 30 tablet, Refills: 0   Associated Diagnoses: Paroxysmal atrial fibrillation (HCC)    rosuvastatin (CRESTOR) 20 MG tablet Take 20 mg by mouth daily.  ACCU-CHEK SOFTCLIX LANCETS lancets Use as instructed to check blood sugar three times daily.  DX  E11.51 Qty: 100 each, Refills: 3    Blood Glucose Monitoring Suppl (ACCU-CHEK AVIVA PLUS) w/Device KIT Use to check blood sugar three times daily.  DX E11.51 Qty: 1 kit, Refills: 0    ezetimibe (ZETIA) 10 MG tablet Take 1 tablet (10 mg total) by mouth daily. Qty: 30 tablet, Refills: 1    glucose blood (ACCU-CHEK AVIVA PLUS) test strip Use as instructed to check blood sugar three times daily. DX  E11.51 Qty: 300 each, Refills: 1      STOP taking these medications     azithromycin (ZITHROMAX Z-PAK) 250 MG tablet      losartan (COZAAR) 25 MG tablet        Allergies  Allergen Reactions  . Statins Other (See Comments)    Reaction:  Muscle pain   . Amlodipine Besylate Other (See Comments)    Reaction:  Muscle pain   . Cephalexin Hives    Contact information for follow-up providers    Kerin Ransom, PA-C Follow up on 04/11/2016.   Specialties:  Cardiology, Radiology Why:  See provider at 9:30 am, please arrive 15 minutes early for paperwork. Contact  information: [address] [place] [address] [phone]            Contact information for after-discharge care    [facility] Follow up.   Specialty:  [place] information: [address] [place] [id number]                   The results of significant diagnostics from this hospitalization (including imaging, microbiology, ancillary and laboratory) are listed below for reference.    Significant Diagnostic Studies: Dg Chest Portable 1 View  Result Date: 03/25/2016 CLINICAL DATA:  Shortness of breath. Bilateral lower extremity edema. 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 The cardiac silhouette remains enlarged. There is new pulmonary vascular congestion with indistinctness of the pulmonary vasculature and with bilateral perihilar and bibasilar interstitial and hazy airspace opacities. Denser airspace opacity is present in the left lower lobe, and there are likely small bilateral pleural effusions. No pneumothorax is seen. No acute osseous abnormality is identified. IMPRESSION: Cardiomegaly with new small bilateral pleural effusions and bibasilar airspace opacities compatible with edema. Superimposed infection not excluded in the left lower lobe. Electronically Signed   By: Logan Bores M.D.   On: 03/25/2016 07:00   Dg Knee Complete 4 Views Left  Result Date: 03/23/2016 CLINICAL DATA:  Fall out of wheelchair today. Left knee pain. Initial encounter. EXAM: LEFT KNEE - COMPLETE 4+ VIEW COMPARISON:  None. FINDINGS: No evidence of fracture, dislocation, or joint effusion. No evidence of arthropathy or other focal bone abnormality. Previous below-the-knee amputation, without evidence of osteolysis or periostitis. Prepatellar and infrapatellar soft tissue swelling noted. Peripheral vascular calcification also  demonstrated. IMPRESSION: Prepatellar and infrapatellar soft tissue swelling. No evidence of acute fracture or dislocation. Below-the-knee amputation and peripheral vascular disease. Electronically Signed   By: Earle Gell M.D.   On: 03/23/2016 16:05   Dg Knee Complete 4 Views Right  Result Date: 03/23/2016 CLINICAL DATA:  [place] [address] out of wheelchair today. Right knee pain. Initial encounter. EXAM: RIGHT KNEE - COMPLETE 4+ VIEW COMPARISON:  02/22/2014 FINDINGS: No evidence of fracture, dislocation, or joint effusion. Moderate tricompartmental osteoarthritis. Previous below-the-knee  amputation is stable in appearance. No evidence of osteolysis or periostitis. Prepatellar soft tissue swelling noted. Peripheral vascular calcification also demonstrated. IMPRESSION: Prepatellar soft tissue swelling. No evidence of fracture or dislocation. Moderate tricompartmental osteoarthritis. Stable appearance of below-the-knee amputation. Electronically Signed   By: Earle Gell M.D.   On: 03/23/2016 16:03    Microbiology: Recent Results (from the past 240 hour(s))  MRSA PCR Screening     Status: Abnormal   Collection Time: 03/25/16  9:27 AM  Result Value Ref Range Status   MRSA by PCR POSITIVE (A) NEGATIVE Final    Comment:        The GeneXpert MRSA Assay (FDA approved for NASAL specimens only), is one component of a comprehensive MRSA colonization surveillance program. It is not intended to diagnose MRSA infection nor to guide or monitor treatment for MRSA infections. RESULT CALLED TO, READ BACK BY AND VERIFIED WITH: MAYES,S @ 9169 ON [id number] BY POTEAT,S      Labs: Basic Metabolic Panel:  Recent Labs Lab 03/29/16 0417 03/30/16 0421 03/31/16 0517 04/01/16 0406 04/02/16 0432  NA 135 138 138 141 139  K 3.9 3.8 3.7 3.5 3.7  CL 101 102 101 104 101  CO2 28 27 30 30 29   GLUCOSE 197* 158* 108* 83 71  BUN 28* 26* 21* 19 17  CREATININE 1.59* 1.43* 1.31* 1.26* 1.35*  CALCIUM 8.4* 8.4* 8.4* 8.6* 8.6*    Liver Function Tests: No results for input(s): AST, ALT, ALKPHOS, BILITOT, PROT, ALBUMIN in the last 168 hours. No results for input(s): LIPASE, AMYLASE in the last 168 hours. No results for input(s): AMMONIA in the last 168 hours. CBC:  Recent Labs Lab 04/02/16 0432  WBC 7.5  NEUTROABS 3.8  HGB 14.4  HCT 43.9  MCV 89.2  PLT 219   Cardiac Enzymes: No results for input(s): CKTOTAL, CKMB, CKMBINDEX, TROPONINI in the last 168 hours. BNP: BNP (last 3 results)  Recent Labs  10/22/15 2119 02/22/16 0403 03/25/16 0605  BNP 147.3* 157.6* 1,098.2*    ProBNP (last 3 results) No results for input(s): PROBNP in the last 8760 hours.  CBG:  Recent Labs Lab 04/01/16 1150 04/01/16 1648 04/01/16 1949 04/02/16 0735 04/02/16 1205  GLUCAP 230* 216* 206* 78 130*       Signed:  THOMPSON,DANIEL MD.  [facility] 04/02/2016, 4:13 PM

## 2016-04-02 NOTE — Clinical Social Work Placement (Signed)
[facility]. Patient & step-daughter, Leroy [name] made aware. Discharge packet given to RN, Tess. [facility] called for transport.     Lincoln MaxinKelly Kyheem Bathgate, LCSW [facility] Clinical Social Worker cell #: [phone]    CLINICAL SOCIAL WORK PLACEMENT  NOTE  Date:  04/02/2016  Patient Details  Name: Leroy [name] MRN: [id number] Date of Birth: October 28, 1945  Clinical Social Work is seeking post-discharge placement for this patient at the Skilled  Nursing Facility level of care (*CSW will initial, date and re-position this form in  chart as items are completed):  Yes   Patient/family provided with [facility] Clinical Social Work Department's list of facilities offering this level of care within the geographic area requested by the patient (or if unable, by the patient's family).  Yes   Patient/family informed of their freedom to choose among providers that offer the needed level of care, that participate in Medicare, Medicaid or managed care program needed by the patient, have an available bed and are willing to accept the patient.  Yes   Patient/family informed of [facility]'s ownership interest in [facility] and [facility], as well as of the fact that they are under no obligation to receive care at these facilities.  PASRR submitted to EDS on 03/28/16     PASRR number received on 03/28/16     Existing PASRR number confirmed on       FL2 transmitted to all facilities in geographic area requested by pt/family on 03/28/16     FL2 transmitted to all facilities within larger geographic area on       Patient informed that his/her managed care company has contracts with or will negotiate with certain facilities, including the following:        Yes   Patient/family informed of bed offers received.  [facility]     Physician recommends and patient chooses bed at      Patient to be  [facility] on 04/02/16.  Patient to be transferred to facility by [facility]     Patient family notified on 04/02/16 of transfer.  Name of family member notified:  patient's step-daughter, [facility]     PHYSICIAN       Additional Comment:    _______________________________________________ Arlyss RepressHarrison, Jamale Spangler F, LCSW 04/02/2016, 4:20 PM

## 2016-04-02 NOTE — Telephone Encounter (Signed)
New message       TCM appt on 04-11-16 with Corine ShelterLuke Kilroy per Bjorn Loserhonda

## 2016-04-03 ENCOUNTER — Telehealth: Payer: Self-pay | Admitting: *Deleted

## 2016-04-03 DIAGNOSIS — I5043 Acute on chronic combined systolic (congestive) and diastolic (congestive) heart failure: Secondary | ICD-10-CM | POA: Diagnosis not present

## 2016-04-03 DIAGNOSIS — I480 Paroxysmal atrial fibrillation: Secondary | ICD-10-CM | POA: Diagnosis not present

## 2016-04-03 DIAGNOSIS — I25718 Atherosclerosis of autologous vein coronary artery bypass graft(s) with other forms of angina pectoris: Secondary | ICD-10-CM | POA: Diagnosis not present

## 2016-04-03 DIAGNOSIS — E1152 Type 2 diabetes mellitus with diabetic peripheral angiopathy with gangrene: Secondary | ICD-10-CM | POA: Diagnosis not present

## 2016-04-03 NOTE — Telephone Encounter (Signed)
1. Pt was on TCM list admitted for Acute on chronic combined systolic and diastolic heart failure. Pt was D/C 04/02/16 to Follow-up with M.D. at skilled nursing facility. Patient will need a basic metabolic profile done to follow-up on electrolytes and renal function in 1 week, and will f/u w/ cardiologist PA [facility]...Raechel Chute

## 2016-04-03 NOTE — Telephone Encounter (Signed)
lmtcb for TCM

## 2016-04-04 NOTE — Telephone Encounter (Signed)
TOC call attempt #2 ([facility])-unable to reach, continues to ring without answer.

## 2016-04-05 NOTE — Telephone Encounter (Signed)
Patient Sports administrator) contacted regarding discharge from [facility] on 04/02/16.   Understands to follow up with provider Sherre Poot on 04/12/15 at 9:30 AM at [facility] location.  Understands discharge instructions? yes  Understands medications and regiment? yes  Understands to bring all medications to this visit? yes

## 2016-04-09 DIAGNOSIS — M6281 Muscle weakness (generalized): Secondary | ICD-10-CM | POA: Diagnosis not present

## 2016-04-09 DIAGNOSIS — I5040 Unspecified combined systolic (congestive) and diastolic (congestive) heart failure: Secondary | ICD-10-CM | POA: Diagnosis not present

## 2016-04-09 DIAGNOSIS — W19XXXA Unspecified fall, initial encounter: Secondary | ICD-10-CM | POA: Diagnosis not present

## 2016-04-09 DIAGNOSIS — R609 Edema, unspecified: Secondary | ICD-10-CM | POA: Diagnosis not present

## 2016-04-09 DIAGNOSIS — R2689 Other abnormalities of gait and mobility: Secondary | ICD-10-CM | POA: Diagnosis not present

## 2016-04-09 DIAGNOSIS — R5381 Other malaise: Secondary | ICD-10-CM | POA: Diagnosis not present

## 2016-04-09 DIAGNOSIS — R062 Wheezing: Secondary | ICD-10-CM | POA: Diagnosis not present

## 2016-04-09 DIAGNOSIS — M79606 Pain in leg, unspecified: Secondary | ICD-10-CM | POA: Diagnosis not present

## 2016-04-11 ENCOUNTER — Ambulatory Visit: Payer: Commercial Managed Care - HMO | Admitting: Cardiology

## 2016-04-18 DIAGNOSIS — L97119 Non-pressure chronic ulcer of right thigh with unspecified severity: Secondary | ICD-10-CM | POA: Diagnosis not present

## 2016-04-19 DIAGNOSIS — I2510 Atherosclerotic heart disease of native coronary artery without angina pectoris: Secondary | ICD-10-CM | POA: Diagnosis not present

## 2016-04-19 DIAGNOSIS — I480 Paroxysmal atrial fibrillation: Secondary | ICD-10-CM | POA: Diagnosis not present

## 2016-04-19 DIAGNOSIS — I739 Peripheral vascular disease, unspecified: Secondary | ICD-10-CM | POA: Diagnosis not present

## 2016-04-19 DIAGNOSIS — I5040 Unspecified combined systolic (congestive) and diastolic (congestive) heart failure: Secondary | ICD-10-CM | POA: Diagnosis not present

## 2016-04-22 DIAGNOSIS — E1122 Type 2 diabetes mellitus with diabetic chronic kidney disease: Secondary | ICD-10-CM | POA: Diagnosis not present

## 2016-04-22 DIAGNOSIS — E1142 Type 2 diabetes mellitus with diabetic polyneuropathy: Secondary | ICD-10-CM | POA: Diagnosis not present

## 2016-04-22 DIAGNOSIS — M79605 Pain in left leg: Secondary | ICD-10-CM | POA: Diagnosis not present

## 2016-04-22 DIAGNOSIS — I25729 Atherosclerosis of autologous artery coronary artery bypass graft(s) with unspecified angina pectoris: Secondary | ICD-10-CM | POA: Diagnosis not present

## 2016-04-22 DIAGNOSIS — M79604 Pain in right leg: Secondary | ICD-10-CM | POA: Diagnosis not present

## 2016-04-22 DIAGNOSIS — I130 Hypertensive heart and chronic kidney disease with heart failure and stage 1 through stage 4 chronic kidney disease, or unspecified chronic kidney disease: Secondary | ICD-10-CM | POA: Diagnosis not present

## 2016-04-22 DIAGNOSIS — N189 Chronic kidney disease, unspecified: Secondary | ICD-10-CM | POA: Diagnosis not present

## 2016-04-22 DIAGNOSIS — I480 Paroxysmal atrial fibrillation: Secondary | ICD-10-CM | POA: Diagnosis not present

## 2016-04-22 DIAGNOSIS — I5043 Acute on chronic combined systolic (congestive) and diastolic (congestive) heart failure: Secondary | ICD-10-CM | POA: Diagnosis not present

## 2016-04-23 ENCOUNTER — Telehealth: Payer: Self-pay | Admitting: Internal Medicine

## 2016-04-23 DIAGNOSIS — M79604 Pain in right leg: Secondary | ICD-10-CM | POA: Diagnosis not present

## 2016-04-23 DIAGNOSIS — I25729 Atherosclerosis of autologous artery coronary artery bypass graft(s) with unspecified angina pectoris: Secondary | ICD-10-CM | POA: Diagnosis not present

## 2016-04-23 DIAGNOSIS — E1142 Type 2 diabetes mellitus with diabetic polyneuropathy: Secondary | ICD-10-CM | POA: Diagnosis not present

## 2016-04-23 DIAGNOSIS — E1151 Type 2 diabetes mellitus with diabetic peripheral angiopathy without gangrene: Secondary | ICD-10-CM

## 2016-04-23 DIAGNOSIS — I255 Ischemic cardiomyopathy: Secondary | ICD-10-CM

## 2016-04-23 DIAGNOSIS — I480 Paroxysmal atrial fibrillation: Secondary | ICD-10-CM | POA: Diagnosis not present

## 2016-04-23 DIAGNOSIS — I5043 Acute on chronic combined systolic (congestive) and diastolic (congestive) heart failure: Secondary | ICD-10-CM | POA: Diagnosis not present

## 2016-04-23 DIAGNOSIS — N189 Chronic kidney disease, unspecified: Secondary | ICD-10-CM | POA: Diagnosis not present

## 2016-04-23 DIAGNOSIS — Z89512 Acquired absence of left leg below knee: Principal | ICD-10-CM

## 2016-04-23 DIAGNOSIS — Z89511 Acquired absence of right leg below knee: Secondary | ICD-10-CM

## 2016-04-23 DIAGNOSIS — I130 Hypertensive heart and chronic kidney disease with heart failure and stage 1 through stage 4 chronic kidney disease, or unspecified chronic kidney disease: Secondary | ICD-10-CM | POA: Diagnosis not present

## 2016-04-23 DIAGNOSIS — M79605 Pain in left leg: Secondary | ICD-10-CM | POA: Diagnosis not present

## 2016-04-23 DIAGNOSIS — E1122 Type 2 diabetes mellitus with diabetic chronic kidney disease: Secondary | ICD-10-CM | POA: Diagnosis not present

## 2016-04-23 NOTE — Telephone Encounter (Signed)
Gave verbal okay for OT to [facility] as requested.   Orders for bed printed for PCP to sign.   Faxed back to [facility].

## 2016-04-23 NOTE — Telephone Encounter (Signed)
Requesting verbal for OT for two times a week for three weeks and then one times a week for one week.  Patient needs script for drop arm bed side commode, hospital bed and sliding board.  Needs faxed to [facility] at [phone].

## 2016-04-26 ENCOUNTER — Telehealth: Payer: Self-pay | Admitting: Internal Medicine

## 2016-04-26 NOTE — Telephone Encounter (Signed)
Arther , PT from [facility], request verbal order to start evaluation on 04/27/16 (delay due to weather). Please advise.

## 2016-04-26 NOTE — Telephone Encounter (Signed)
Verbal okay given.  

## 2016-04-27 MED ORDER — ACCU-CHEK SOFTCLIX LANCETS MISC: 3 refills | Status: DC

## 2016-04-27 MED ORDER — METOPROLOL SUCCINATE ER 25 MG PO TB24: 25.0000 mg | ORAL_TABLET | Freq: Two times a day (BID) | ORAL | 1 refills | Status: DC

## 2016-04-27 MED ORDER — ACCU-CHEK AVIVA PLUS W/DEVICE KIT: PACK | 0 refills | Status: DC

## 2016-04-27 MED ORDER — CLOPIDOGREL BISULFATE 75 MG PO TABS: 75.0000 mg | ORAL_TABLET | Freq: Every day | ORAL | 1 refills | Status: AC

## 2016-04-27 NOTE — Telephone Encounter (Signed)
Faxed to [facility] again.

## 2016-04-30 ENCOUNTER — Encounter: Payer: Self-pay | Admitting: Internal Medicine

## 2016-04-30 ENCOUNTER — Ambulatory Visit (INDEPENDENT_AMBULATORY_CARE_PROVIDER_SITE_OTHER): Payer: Medicare HMO | Admitting: Internal Medicine

## 2016-04-30 VITALS — BP 130/80 | HR 75 | Temp 97.5°F | Resp 20 | Wt >= 6400 oz

## 2016-04-30 DIAGNOSIS — N182 Chronic kidney disease, stage 2 (mild): Secondary | ICD-10-CM | POA: Diagnosis not present

## 2016-04-30 DIAGNOSIS — M79605 Pain in left leg: Secondary | ICD-10-CM | POA: Diagnosis not present

## 2016-04-30 DIAGNOSIS — J42 Unspecified chronic bronchitis: Secondary | ICD-10-CM | POA: Diagnosis not present

## 2016-04-30 DIAGNOSIS — Z89512 Acquired absence of left leg below knee: Secondary | ICD-10-CM

## 2016-04-30 DIAGNOSIS — E1122 Type 2 diabetes mellitus with diabetic chronic kidney disease: Secondary | ICD-10-CM | POA: Diagnosis not present

## 2016-04-30 DIAGNOSIS — N189 Chronic kidney disease, unspecified: Secondary | ICD-10-CM | POA: Diagnosis not present

## 2016-04-30 DIAGNOSIS — E1151 Type 2 diabetes mellitus with diabetic peripheral angiopathy without gangrene: Secondary | ICD-10-CM | POA: Diagnosis not present

## 2016-04-30 DIAGNOSIS — I25729 Atherosclerosis of autologous artery coronary artery bypass graft(s) with unspecified angina pectoris: Secondary | ICD-10-CM | POA: Diagnosis not present

## 2016-04-30 DIAGNOSIS — E1142 Type 2 diabetes mellitus with diabetic polyneuropathy: Secondary | ICD-10-CM | POA: Diagnosis not present

## 2016-04-30 DIAGNOSIS — Z89511 Acquired absence of right leg below knee: Secondary | ICD-10-CM

## 2016-04-30 DIAGNOSIS — I5043 Acute on chronic combined systolic (congestive) and diastolic (congestive) heart failure: Secondary | ICD-10-CM | POA: Diagnosis not present

## 2016-04-30 DIAGNOSIS — I10 Essential (primary) hypertension: Secondary | ICD-10-CM

## 2016-04-30 DIAGNOSIS — I480 Paroxysmal atrial fibrillation: Secondary | ICD-10-CM | POA: Diagnosis not present

## 2016-04-30 DIAGNOSIS — M79604 Pain in right leg: Secondary | ICD-10-CM | POA: Diagnosis not present

## 2016-04-30 DIAGNOSIS — I130 Hypertensive heart and chronic kidney disease with heart failure and stage 1 through stage 4 chronic kidney disease, or unspecified chronic kidney disease: Secondary | ICD-10-CM | POA: Diagnosis not present

## 2016-04-30 LAB — POCT GLUCOSE (DEVICE FOR HOME USE): GLUCOSE FASTING, POC: 141 mg/dL — AB (ref 70–99)

## 2016-04-30 MED ORDER — INDACATEROL-GLYCOPYRROLATE 27.5-15.6 MCG IN CAPS: 1.0000 | ORAL_CAPSULE | Freq: Two times a day (BID) | RESPIRATORY_TRACT | 11 refills | Status: DC

## 2016-04-30 NOTE — Progress Notes (Signed)
Subjective:  Patient ID: Leroy [name], male    DOB: [birth year]  Age: 71 y.o. MRN: [id number]  CC: Hypertension and Cough   HPI Leroy [name] presents for a blood pressure check and concerns about a chronic cough. He has COPD and has been using an albuterol inhaler but has not gotten much symptom relief. He complains of a mild, mostly nonproductive cough but when it is productive it's productive of clear phlegm with wheezing and shortness of breath. He tells me his blood pressure has been well controlled and he has had no recent episodes of chest pain, palpitations, edema, or fatigue.  Outpatient Medications Prior to Visit  Medication Sig Dispense Refill  . ACCU-CHEK SOFTCLIX LANCETS lancets Use as instructed to check blood sugar three times daily.  DX  E11.51 100 each 3  . albuterol (PROVENTIL HFA;VENTOLIN HFA) 108 (90 Base) MCG/ACT inhaler Inhale 2 puffs into the lungs every 6 (six) hours as needed for wheezing or shortness of breath. 1 Inhaler 0  . Blood Glucose Monitoring Suppl (ACCU-CHEK AVIVA PLUS) w/Device KIT Use to check blood sugar three times daily.  DX E11.51 1 kit 0  . clopidogrel (PLAVIX) 75 MG tablet Take 1 tablet (75 mg total) by mouth daily. 90 tablet 1  . ezetimibe (ZETIA) 10 MG tablet Take 1 tablet (10 mg total) by mouth daily. 30 tablet 1  . furosemide (LASIX) 80 MG tablet Take 1 tablet (80 mg total) by mouth 2 (two) times daily. 60 tablet 3  . glucose blood (ACCU-CHEK AVIVA PLUS) test strip Use as instructed to check blood sugar three times daily. DX  E11.51 300 each 1  . insulin aspart (NOVOLOG) 100 UNIT/ML injection Inject 0-15 Units into the skin 3 (three) times daily with meals. 10 mL 11  . insulin aspart (NOVOLOG) 100 UNIT/ML injection Inject 3 Units into the skin 3 (three) times daily with meals. 10 mL 0  . Insulin Glargine (LANTUS) 100 UNIT/ML Solostar Pen Inject 25 Units into the skin daily at 10 pm. 15 mL 3  . Insulin Pen Needle (B-D UF III MINI PEN NEEDLES)  31G X 5 MM MISC Use to inject insulin as directed. 270 each 3  . metoprolol succinate (TOPROL-XL) 25 MG 24 hr tablet Take 1 tablet (25 mg total) by mouth 2 (two) times daily. 180 tablet 1  . nitroGLYCERIN (NITROSTAT) 0.4 MG SL tablet Place 1 tablet (0.4 mg total) under the tongue every 5 (five) minutes as needed for chest pain. 30 tablet 12  . potassium chloride SA (K-DUR,KLOR-CON) 20 MEQ tablet Take 2 tablets (40 mEq total) by mouth daily. 60 tablet 0  . rivaroxaban (XARELTO) 20 MG TABS tablet Take 1 tablet (20 mg total) by mouth daily at 6 PM. 30 tablet 0  . benzonatate (TESSALON) 100 MG capsule Take 1 capsule (100 mg total) by mouth 3 (three) times daily as needed. (Patient taking differently: Take 100 mg by mouth 3 (three) times daily as needed for cough. ) 20 capsule 0  . rosuvastatin (CRESTOR) 20 MG tablet Take 20 mg by mouth daily.     No facility-administered medications prior to visit.     ROS Review of Systems  Constitutional: Negative for appetite change, chills, diaphoresis, fatigue, fever and unexpected weight change.  HENT: Negative.   Eyes: Negative for visual disturbance.  Respiratory: Positive for cough, shortness of breath and wheezing. Negative for choking, chest tightness and stridor.   Cardiovascular: Negative for chest pain, palpitations and leg swelling.  Gastrointestinal: Negative for abdominal pain, constipation, diarrhea, nausea and vomiting.  Endocrine: Negative.   Genitourinary: Negative.  Negative for difficulty urinating.  Musculoskeletal: Negative for back pain and myalgias.  Skin: Negative.  Negative for color change.  Neurological: Negative for dizziness and light-headedness.  Hematological: Negative.  Negative for adenopathy. Does not bruise/bleed easily.  Psychiatric/Behavioral: Negative.     Objective:  BP 130/80   Pulse 75   Temp 97.5 F (36.4 C) (Oral)   Resp 20   Wt (!) 447 lb (202.8 kg)   SpO2 (!) 85% Comment: Patient given O2 in office. 3 L  continuous  BMI 81.76 kg/m   BP Readings from Last 3 Encounters:  04/30/16 130/80  04/02/16 (!) 112/50  03/23/16 (!) 129/109    Wt Readings from Last 3 Encounters:  04/30/16 (!) 447 lb (202.8 kg)  04/02/16 278 lb 1.6 oz (126.1 kg)  03/23/16 277 lb (125.6 kg)    Physical Exam  Constitutional: He is oriented to person, place, and time. No distress.  HENT:  Mouth/Throat: Oropharynx is clear and moist. No oropharyngeal exudate.  Eyes: Conjunctivae are normal. Right eye exhibits no discharge. Left eye exhibits no discharge. No scleral icterus.  Neck: Normal range of motion. Neck supple. No JVD present. No tracheal deviation present. No thyromegaly present.  Cardiovascular: Normal rate, regular rhythm, normal heart sounds and intact distal pulses.  Exam reveals no gallop and no friction rub.   No murmur heard. Pulmonary/Chest: Effort normal. No accessory muscle usage or stridor. No respiratory distress. He has no decreased breath sounds. He has no wheezes. He has rhonchi in the right middle field, the right lower field, the left middle field and the left lower field. He has no rales. He exhibits no tenderness.  He has diffuse rhonchi but good air movement  Abdominal: Soft. Bowel sounds are normal. He exhibits no distension and no mass. There is no tenderness. There is no rebound and no guarding.  Musculoskeletal: Normal range of motion. He exhibits no edema, tenderness or deformity.  The stumps over both lower extremities shows some scab, granulation tissue, and scars. There is no exudate, foul odor, tenderness, erythema, induration, or fluctuance.  Lymphadenopathy:    He has no cervical adenopathy.  Neurological: He is oriented to person, place, and time.  Skin: Skin is warm and dry. No rash noted. He is not diaphoretic. No erythema. No pallor.  Vitals reviewed.   Lab Results  Component Value Date   WBC 7.5 04/02/2016   HGB 14.4 04/02/2016   HCT 43.9 04/02/2016   PLT 219  04/02/2016   GLUCOSE 71 04/02/2016   CHOL 209 (H) 05/23/2015   TRIG 108 05/23/2015   HDL 45 05/23/2015   LDLDIRECT 209.0 09/26/2014   LDLCALC 142 (H) 05/23/2015   ALT 40 03/04/2016   AST 20 03/04/2016   NA 139 04/02/2016   K 3.7 04/02/2016   CL 101 04/02/2016   CREATININE 1.35 (H) 04/02/2016   BUN 17 04/02/2016   CO2 29 04/02/2016   TSH 1.08 01/28/2014   INR 0.97 02/22/2016   HGBA1C 10.6 (H) 02/22/2016   MICROALBUR 9.6 (H) 11/13/2015    Dg Chest Portable 1 View  Result Date: 03/25/2016 CLINICAL DATA:  Shortness of breath. Bilateral lower extremity edema. 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 The cardiac silhouette remains enlarged. There is new pulmonary vascular congestion with indistinctness of the pulmonary vasculature and with bilateral perihilar and bibasilar  interstitial and hazy airspace opacities. Denser airspace opacity is present in the left lower lobe, and there are likely small bilateral pleural effusions. No pneumothorax is seen. No acute osseous abnormality is identified. IMPRESSION: Cardiomegaly with new small bilateral pleural effusions and bibasilar airspace opacities compatible with edema. Superimposed infection not excluded in the left lower lobe. Electronically Signed   By: Logan Bores M.D.   On: 03/25/2016 07:00    Assessment & Plan:   Elward was seen today for hypertension and cough.  Diagnoses and all orders for this visit:  Chronic renal disease, stage 2, mildly decreased glomerular filtration rate (GFR) between 60-89 mL/min/1.73 square meter- his renal function has been stable, he agrees to avoid nephrotoxic agents, will attempt to maintain good blood pressure and blood sugar control.  Essential hypertension- his blood pressure is adequately well controlled.  Diabetes mellitus type 2 with peripheral artery disease (HCC) -     POCT Glucose (Device for Home Use)  Chronic bronchitis,  unspecified chronic bronchitis type ([place])- I have asked him to start using a LAMA/LABA combination. I gave him a sample of Utibron and showed him how to use it. He demonstrated proficiency with its use. -     Indacaterol-Glycopyrrolate (UTIBRON NEOHALER) 27.5-15.6 MCG CAPS; Place 1 puff into inhaler and inhale 2 (two) times daily.   I have discontinued Mr. Tuch's benzonatate. I am also having him start on Indacaterol-Glycopyrrolate. Additionally, I am having him maintain his ezetimibe, nitroGLYCERIN, Insulin Pen Needle, rivaroxaban, albuterol, rosuvastatin, glucose blood, furosemide, insulin aspart, Insulin Glargine, potassium chloride SA, insulin aspart, ACCU-CHEK SOFTCLIX LANCETS, clopidogrel, ACCU-CHEK AVIVA PLUS, and metoprolol succinate.  Meds ordered this encounter  Medications  . Indacaterol-Glycopyrrolate (UTIBRON NEOHALER) 27.5-15.6 MCG CAPS    Sig: Place 1 puff into inhaler and inhale 2 (two) times daily.    Dispense:  60 capsule    Refill:  11     Follow-up: Return in about 2 months (around 06/28/2016).  [name], MD

## 2016-04-30 NOTE — Patient Instructions (Signed)
Chronic Obstructive Pulmonary Disease Chronic obstructive pulmonary disease (COPD) is a common lung condition in which airflow from the lungs is limited. COPD is a general term that can be used to describe many different lung problems that limit airflow, including both chronic bronchitis and emphysema. If you have COPD, your lung function will probably never return to normal, but there are measures you can take to improve lung function and make yourself feel better. What are the causes?  Smoking (common).  Exposure to secondhand smoke.  Genetic problems.  Chronic inflammatory lung diseases or recurrent infections. What are the signs or symptoms?  Shortness of breath, especially with physical activity.  Deep, persistent (chronic) cough with a large amount of thick mucus.  Wheezing.  Rapid breaths (tachypnea).  Gray or bluish discoloration (cyanosis) of the skin, especially in your fingers, toes, or lips.  Fatigue.  Weight loss.  Frequent infections or episodes when breathing symptoms become much worse (exacerbations).  Chest tightness. How is this diagnosed? Your health care provider will take a medical history and perform a physical examination to diagnose COPD. Additional tests for COPD may include:  Lung (pulmonary) function tests.  Chest X-ray.  CT scan.  Blood tests. How is this treated? Treatment for COPD may include:  Inhaler and nebulizer medicines. These help manage the symptoms of COPD and make your breathing more comfortable.  Supplemental oxygen. Supplemental oxygen is only helpful if you have a low oxygen level in your blood.  Exercise and physical activity. These are beneficial for nearly all people with COPD.  Lung surgery or transplant.  Nutrition therapy to gain weight, if you are underweight.  Pulmonary rehabilitation. This may involve working with a team of health care providers and specialists, such as respiratory, occupational, and physical  therapists. Follow these instructions at home:  Take all medicines (inhaled or pills) as directed by your health care provider.  Avoid over-the-counter medicines or cough syrups that dry up your airway (such as antihistamines) and slow down the elimination of secretions unless instructed otherwise by your health care provider.  If you are a smoker, the most important thing that you can do is stop smoking. Continuing to smoke will cause further lung damage and breathing trouble. Ask your health care provider for help with quitting smoking. He or she can direct you to community resources or hospitals that provide support.  Avoid exposure to irritants such as smoke, chemicals, and fumes that aggravate your breathing.  Use oxygen therapy and pulmonary rehabilitation if directed by your health care provider. If you require home oxygen therapy, ask your health care provider whether you should purchase a pulse oximeter to measure your oxygen level at home.  Avoid contact with individuals who have a contagious illness.  Avoid extreme temperature and humidity changes.  Eat healthy foods. Eating smaller, more frequent meals and resting before meals may help you maintain your strength.  Stay active, but balance activity with periods of rest. Exercise and physical activity will help you maintain your ability to do things you want to do.  Preventing infection and hospitalization is very important when you have COPD. Make sure to receive all the vaccines your health care provider recommends, especially the pneumococcal and influenza vaccines. Ask your health care provider whether you need a pneumonia vaccine.  Learn and use relaxation techniques to manage stress.  Learn and use controlled breathing techniques as directed by your health care provider. Controlled breathing techniques include: 1. Pursed lip breathing. Start by breathing in (inhaling)   through your nose for 1 second. Then, purse your lips as  if you were going to whistle and breathe out (exhale) through the pursed lips for 2 seconds. 2. Diaphragmatic breathing. Start by putting one hand on your abdomen just above your waist. Inhale slowly through your nose. The hand on your abdomen should move out. Then purse your lips and exhale slowly. You should be able to feel the hand on your abdomen moving in as you exhale.  Learn and use controlled coughing to clear mucus from your lungs. Controlled coughing is a series of short, progressive coughs. The steps of controlled coughing are: 1. Lean your head slightly forward. 2. Breathe in deeply using diaphragmatic breathing. 3. Try to hold your breath for 3 seconds. 4. Keep your mouth slightly open while coughing twice. 5. Spit any mucus out into a tissue. 6. Rest and repeat the steps once or twice as needed. Contact a health care provider if:  You are coughing up more mucus than usual.  There is a change in the color or thickness of your mucus.  Your breathing is more labored than usual.  Your breathing is faster than usual. Get help right away if:  You have shortness of breath while you are resting.  You have shortness of breath that prevents you from:  Being able to talk.  Performing your usual physical activities.  You have chest pain lasting longer than 5 minutes.  Your skin color is more cyanotic than usual.  You measure low oxygen saturations for longer than 5 minutes with a pulse oximeter. This information is not intended to replace advice given to you by your health care provider. Make sure you discuss any questions you have with your health care provider. Document Released: 01/02/2005 Document Revised: 08/31/2015 Document Reviewed: 11/19/2012 Elsevier Interactive Patient Education  2017 [employer]  

## 2016-04-30 NOTE — Progress Notes (Addendum)
Pre visit review using our clinic review tool, if applicable. No additional management support is needed unless otherwise documented below in the visit note. 

## 2016-05-01 NOTE — Addendum Note (Signed)
Addended by: Radford Pax M on: 05/01/2016 12:24 PM   Modules accepted: Orders

## 2016-05-02 ENCOUNTER — Other Ambulatory Visit: Payer: Self-pay | Admitting: Internal Medicine

## 2016-05-02 ENCOUNTER — Telehealth: Payer: Self-pay

## 2016-05-02 DIAGNOSIS — I25729 Atherosclerosis of autologous artery coronary artery bypass graft(s) with unspecified angina pectoris: Secondary | ICD-10-CM | POA: Diagnosis not present

## 2016-05-02 DIAGNOSIS — M79605 Pain in left leg: Secondary | ICD-10-CM | POA: Diagnosis not present

## 2016-05-02 DIAGNOSIS — M79604 Pain in right leg: Secondary | ICD-10-CM | POA: Diagnosis not present

## 2016-05-02 DIAGNOSIS — I130 Hypertensive heart and chronic kidney disease with heart failure and stage 1 through stage 4 chronic kidney disease, or unspecified chronic kidney disease: Secondary | ICD-10-CM | POA: Diagnosis not present

## 2016-05-02 DIAGNOSIS — E1122 Type 2 diabetes mellitus with diabetic chronic kidney disease: Secondary | ICD-10-CM | POA: Diagnosis not present

## 2016-05-02 DIAGNOSIS — N189 Chronic kidney disease, unspecified: Secondary | ICD-10-CM | POA: Diagnosis not present

## 2016-05-02 DIAGNOSIS — I5043 Acute on chronic combined systolic (congestive) and diastolic (congestive) heart failure: Secondary | ICD-10-CM | POA: Diagnosis not present

## 2016-05-02 DIAGNOSIS — I480 Paroxysmal atrial fibrillation: Secondary | ICD-10-CM | POA: Diagnosis not present

## 2016-05-02 DIAGNOSIS — E1142 Type 2 diabetes mellitus with diabetic polyneuropathy: Secondary | ICD-10-CM | POA: Diagnosis not present

## 2016-05-02 MED ORDER — MUPIROCIN CALCIUM 2 % EX CREA: 1.0000 "application " | TOPICAL_CREAM | Freq: Two times a day (BID) | CUTANEOUS | 3 refills | Status: DC

## 2016-05-02 MED ORDER — ALCOHOL SWABS PADS: MEDICATED_PAD | 1 refills | Status: DC

## 2016-05-02 MED ORDER — GLUCOSE BLOOD VI STRP: ORAL_STRIP | 1 refills | Status: DC

## 2016-05-02 NOTE — Telephone Encounter (Signed)
RX sent

## 2016-05-02 NOTE — Addendum Note (Signed)
Addended by: Radford Pax M on: 05/02/2016 03:02 PM   Modules accepted: Orders

## 2016-05-02 NOTE — Telephone Encounter (Signed)
Please advise patient called in and stated that PCP prescribed him a cream for his legs when the wounds busted open and became infected. This has happened again and he is needing this to be sent in again

## 2016-05-03 NOTE — Telephone Encounter (Signed)
Pt informed rx was sent.  

## 2016-05-10 ENCOUNTER — Telehealth: Payer: Self-pay | Admitting: Emergency Medicine

## 2016-05-10 DIAGNOSIS — I480 Paroxysmal atrial fibrillation: Secondary | ICD-10-CM | POA: Diagnosis not present

## 2016-05-10 DIAGNOSIS — M79605 Pain in left leg: Secondary | ICD-10-CM | POA: Diagnosis not present

## 2016-05-10 DIAGNOSIS — M79604 Pain in right leg: Secondary | ICD-10-CM | POA: Diagnosis not present

## 2016-05-10 DIAGNOSIS — I130 Hypertensive heart and chronic kidney disease with heart failure and stage 1 through stage 4 chronic kidney disease, or unspecified chronic kidney disease: Secondary | ICD-10-CM | POA: Diagnosis not present

## 2016-05-10 DIAGNOSIS — I5043 Acute on chronic combined systolic (congestive) and diastolic (congestive) heart failure: Secondary | ICD-10-CM | POA: Diagnosis not present

## 2016-05-10 DIAGNOSIS — N189 Chronic kidney disease, unspecified: Secondary | ICD-10-CM | POA: Diagnosis not present

## 2016-05-10 DIAGNOSIS — I25729 Atherosclerosis of autologous artery coronary artery bypass graft(s) with unspecified angina pectoris: Secondary | ICD-10-CM | POA: Diagnosis not present

## 2016-05-10 DIAGNOSIS — E1122 Type 2 diabetes mellitus with diabetic chronic kidney disease: Secondary | ICD-10-CM | POA: Diagnosis not present

## 2016-05-10 DIAGNOSIS — E1142 Type 2 diabetes mellitus with diabetic polyneuropathy: Secondary | ICD-10-CM | POA: Diagnosis not present

## 2016-05-13 ENCOUNTER — Telehealth: Payer: Self-pay | Admitting: Internal Medicine

## 2016-05-13 NOTE — Telephone Encounter (Signed)
Did OT assessment.  Ordered sliding board and bedside commode.  Wanted to check on status of script.  Requesting verbal order for social worker and PT assessment.

## 2016-05-13 NOTE — Telephone Encounter (Signed)
error 

## 2016-05-14 DIAGNOSIS — M79604 Pain in right leg: Secondary | ICD-10-CM | POA: Diagnosis not present

## 2016-05-14 DIAGNOSIS — E1122 Type 2 diabetes mellitus with diabetic chronic kidney disease: Secondary | ICD-10-CM | POA: Diagnosis not present

## 2016-05-14 DIAGNOSIS — I130 Hypertensive heart and chronic kidney disease with heart failure and stage 1 through stage 4 chronic kidney disease, or unspecified chronic kidney disease: Secondary | ICD-10-CM | POA: Diagnosis not present

## 2016-05-14 DIAGNOSIS — N189 Chronic kidney disease, unspecified: Secondary | ICD-10-CM | POA: Diagnosis not present

## 2016-05-14 DIAGNOSIS — E1142 Type 2 diabetes mellitus with diabetic polyneuropathy: Secondary | ICD-10-CM | POA: Diagnosis not present

## 2016-05-14 DIAGNOSIS — I5043 Acute on chronic combined systolic (congestive) and diastolic (congestive) heart failure: Secondary | ICD-10-CM | POA: Diagnosis not present

## 2016-05-14 DIAGNOSIS — M79605 Pain in left leg: Secondary | ICD-10-CM | POA: Diagnosis not present

## 2016-05-14 DIAGNOSIS — I25729 Atherosclerosis of autologous artery coronary artery bypass graft(s) with unspecified angina pectoris: Secondary | ICD-10-CM | POA: Diagnosis not present

## 2016-05-14 DIAGNOSIS — I480 Paroxysmal atrial fibrillation: Secondary | ICD-10-CM | POA: Diagnosis not present

## 2016-05-14 NOTE — Telephone Encounter (Signed)
refaxed rx for DME.  Verbal okay PT and Social Work.   Radavan stated that patients condition is not good and that he went there today and he is covered in urine and feces. Radavan asked if we could place into a facility. I informed that we were not able to place but will sign forms for patient if he is placed into a facility.

## 2016-05-15 NOTE — Progress Notes (Deleted)
Cardiology Office Note   Date:  05/15/2016   ID:  Leroy [name], Leroy [name] 1945-07-14, MRN [id number]  PCP:  [name], MD  Cardiologist:   Minus Breeding, MD  Referring:  ***  No chief complaint on file.     History of Present Illness: Leroy [name] is a 71 y.o. male who presents for follow up of  known history of CAD s/p CABG.  He was previously seen by Dr. Irish Lack.  He was admitted in November with chest pain/NSTEMI. Cardiac cath demonstrated RCA occluded.  Proximal LAD 100%, OM1 100%, OM2 100%, SVG to RCA occluded, SVT to OM1 patent, SVG y graft to OM2 occluded, LIMA  To LAD patent with occluded at the insertion.  He had POBA of the LAD.    He has PAF and has been treated with Xarelto and Plavix.  ***  Past Medical History:  Diagnosis Date  . Atrial flutter ([place])   . Cataract   . Chronic kidney disease    on Lisinopril to protect kidneys d/t being on Metformin per pt  . Chronic systolic CHF (congestive heart failure) ([place])   . Coronary artery disease    a. s/p remote CABG 2001 at [facility] b. Cath 03/2010: for med rx.;  c.  Carlton Adam myoview (1/14):  Large Inferior apical MI, very small area of peri-infarct ischemia toward the inferior apical segment. EF 19%.  Med Rx was continued.    . Diabetes mellitus 1979   takes Metformni daily  and Lantus 50units bid  . DVT (deep venous thrombosis) ([place]) 2011   legs  . H/O hiatal hernia   . H/O medication noncompliance    Due to insurance issues  . History of blood clots    in legs--this was in 2011--has been off of Coumadin 86month;takes Xarelto daily  . Hyperlipidemia   . Hypotension   . Ischemic cardiomyopathy    a. EF 40% 2010. b. Echo (2/14):  mild LVH, EF 30-35%, diff HK, Gr 1 DD, MAC, mild MR, mod LAE, PASP 39  . Joint pain   . Myocardial infarction    total of 8 heart attacks;last one in 2011  . Paroxysmal atrial fibrillation (HCC)   . Peripheral neuropathy ([place]   . Peripheral vascular disease ([place]    a. s/p L BKA 2014.   .Marland KitchenShortness of breath dyspnea   . Stroke ([facility]     Past Surgical History:  Procedure Laterality Date  . ABDOMINAL AORTAGRAM  04-30-12  . ABDOMINAL AORTAGRAM N/A 04/30/2012   Procedure: ABDOMINAL AMaxcine Ham  Surgeon: BConrad Holden MD;  [facility];  Service: Cardiovascular;  Laterality: N/A;  . ADENOIDECTOMY    . AMPUTATION Left 06/03/2012   Procedure: AMPUTATION BELOW KNEE;  Surgeon: BConrad Oliver MD;  [place]  Service: Vascular;  Laterality: Left;  . AMPUTATION Right 04/14/2013   Procedure: AMPUTATION BELOW KNEE ;  Surgeon: BConrad [place] MD;  [place]  Service: Vascular;  Laterality: Right;  . APPLICATION OF WOUND VAC Right 04/20/2014   Procedure: APPLICATION OF WOUND VAC;  Surgeon: BConrad  MD;  [place]  Service: Vascular;  Laterality: Right;  . CARDIAC CATHETERIZATION  03/19/10  . CARDIAC CATHETERIZATION N/A 02/22/2016   Procedure: Left Heart Cath and Cors/Grafts Angiography;  Surgeon: JJettie Booze MD;  [place] [facility];  Service: Cardiovascular;  Laterality: N/A;  . CARDIAC CATHETERIZATION N/A 02/22/2016   Procedure: Coronary Stent Intervention;  Surgeon:  Jettie Booze, MD;  Location: [place] CV LAB;  Service: Cardiovascular;  Laterality: N/A;  . CARDIAC CATHETERIZATION    . COLONOSCOPY WITH PROPOFOL N/A 10/24/2015   Procedure: COLONOSCOPY WITH PROPOFOL;  Surgeon: Milus Banister, MD;  [facility];  Service: Endoscopy;  Laterality: N/A;  . CORONARY ANGIOPLASTY  2002   3 stents  . CORONARY ARTERY BYPASS GRAFT  03/2000   quadruple  . ENDARTERECTOMY FEMORAL Right 03/25/2013   Procedure: ENDARTERECTOMY FEMORAL ;  Surgeon: Conrad Major, MD;  Location: [place];  Service: Vascular;  Laterality: Right;  . EYE SURGERY Right    Catarct  . HERNIA REPAIR     Hiatal Hernia  . I&D EXTREMITY Right 04/14/2013   Procedure: IRRIGATION AND DEBRIDEMENT OF RIGHT  GROIN & PLACEMENT OF VAC DRESSING;  Surgeon: Conrad [employer], MD;   Location: [place];  Service: Vascular;  Laterality: Right;  . I&D EXTREMITY Right 04/20/2014   Procedure: DEBRIDEMENT OF RIGHT BELOW KNEE AMPUTATION STUMP;  Surgeon: Conrad Hoonah-Angoon, MD;  Location: [place];  Service: Vascular;  Laterality: Right;  . LOWER EXTREMITY ANGIOGRAM Bilateral 04/30/2012   Procedure: LOWER EXTREMITY ANGIOGRAM;  Surgeon: Conrad Carlisle, MD;  Location: [facility] CATH LAB;  Service: Cardiovascular;  Laterality: Bilateral;  bilat lower extrem angio  . PATCH ANGIOPLASTY Right 03/25/2013   Procedure: PATCH ANGIOPLASTY USING VASCU-GUARD PERIPHERAL VASCULAR PATCH;  Surgeon: Conrad [place], MD;  Location: [place];  Service: Vascular;  Laterality: Right;  . revasculariztion  2011/2010   x 2   . TONSILLECTOMY    . WOUND DEBRIDEMENT Right 04/20/2014   s/p  rt bka  with application of wound vac     Current Outpatient Prescriptions  Medication Sig Dispense Refill  . ACCU-CHEK SOFTCLIX LANCETS lancets Use as instructed to check blood sugar three times daily.  DX  E11.51 100 each 3  . albuterol (PROVENTIL HFA;VENTOLIN HFA) 108 (90 Base) MCG/ACT inhaler Inhale 2 puffs into the lungs every 6 (six) hours as needed for wheezing or shortness of breath. 1 Inhaler 0  . Alcohol Swabs PADS Dispense swab to be used as directed 300 each 1  . Blood Glucose Monitoring Suppl (ACCU-CHEK AVIVA PLUS) w/Device KIT Use to check blood sugar three times daily.  DX E11.51 1 kit 0  . clopidogrel (PLAVIX) 75 MG tablet Take 1 tablet (75 mg total) by mouth daily. 90 tablet 1  . ezetimibe (ZETIA) 10 MG tablet Take 1 tablet (10 mg total) by mouth daily. 30 tablet 1  . furosemide (LASIX) 80 MG tablet Take 1 tablet (80 mg total) by mouth 2 (two) times daily. 60 tablet 3  . glucose blood (ACCU-CHEK AVIVA PLUS) test strip Use as instructed to check blood sugar three times daily. DX  E11.51 300 each 1  . Indacaterol-Glycopyrrolate (UTIBRON NEOHALER) 27.5-15.6 MCG CAPS Place 1 puff into inhaler and inhale 2 (two) times daily. 60 capsule  11  . insulin aspart (NOVOLOG) 100 UNIT/ML injection Inject 0-15 Units into the skin 3 (three) times daily with meals. 10 mL 11  . insulin aspart (NOVOLOG) 100 UNIT/ML injection Inject 3 Units into the skin 3 (three) times daily with meals. 10 mL 0  . Insulin Glargine (LANTUS) 100 UNIT/ML Solostar Pen Inject 25 Units into the skin daily at 10 pm. 15 mL 3  . Insulin Pen Needle (B-D UF III MINI PEN NEEDLES) 31G X 5 MM MISC Use to inject insulin as directed. 270 each 3  . metoprolol  succinate (TOPROL-XL) 25 MG 24 hr tablet Take 1 tablet (25 mg total) by mouth 2 (two) times daily. 180 tablet 1  . mupirocin cream (BACTROBAN) 2 % Apply 1 application topically 2 (two) times daily. 30 g 3  . nitroGLYCERIN (NITROSTAT) 0.4 MG SL tablet Place 1 tablet (0.4 mg total) under the tongue every 5 (five) minutes as needed for chest pain. 30 tablet 12  . potassium chloride SA (K-DUR,KLOR-CON) 20 MEQ tablet Take 2 tablets (40 mEq total) by mouth daily. 60 tablet 0  . rivaroxaban (XARELTO) 20 MG TABS tablet Take 1 tablet (20 mg total) by mouth daily at 6 PM. 30 tablet 0  . rosuvastatin (CRESTOR) 20 MG tablet Take 20 mg by mouth daily.     No current facility-administered medications for this visit.     Allergies:   Statins; Amlodipine besylate; and Cephalexin    ROS:  Please see the history of present illness.   Otherwise, review of systems are positive for {NONE DEFAULTED:18576::"none"}.   All other systems are reviewed and negative.    PHYSICAL EXAM: VS:  There were no vitals taken for this visit. , BMI There is no height or weight on file to calculate BMI. GENERAL:  Well appearing NECK:  No jugular venous distention, waveform within normal limits, carotid upstroke brisk and symmetric, no bruits, no thyromegaly LUNGS:  Clear to auscultation bilaterally BACK:  No CVA tenderness CHEST:  Unremarkable HEART:  PMI not displaced or sustained,S1 and S2 within normal limits, no S3, no S4, no clicks, no rubs, ***  murmurs ABD:  Flat, positive bowel sounds normal in frequency in pitch, no bruits, no rebound, no guarding, no midline pulsatile mass, no hepatomegaly, no splenomegaly EXT:  2 plus pulses throughout, no edema, no cyanosis no clubbing   EKG:  EKG {ACTION; IS/IS XKP:[id number]} ordered today. The ekg ordered today demonstrates ***   Recent Labs: 10/24/2015: Magnesium 1.9 03/04/2016: ALT 40 03/25/2016: B Natriuretic Peptide 1,098.2 04/02/2016: BUN 17; Creatinine, Ser 1.35; Hemoglobin 14.4; Platelets 219; Potassium 3.7; Sodium 139    Lipid Panel    Component Value Date/Time   CHOL 209 (H) 05/23/2015 0528   TRIG 108 05/23/2015 0528   HDL 45 05/23/2015 0528   CHOLHDL 4.6 05/23/2015 0528   VLDL 22 05/23/2015 0528   LDLCALC 142 (H) 05/23/2015 0528   LDLDIRECT 209.0 09/26/2014 0951      Wt Readings from Last 3 Encounters:  04/30/16 (!) 447 lb (202.8 kg)  04/02/16 278 lb 1.6 oz (126.1 kg)  03/23/16 277 lb (125.6 kg)      Other studies Reviewed: Additional studies/ records that were reviewed today include: ***. Review of the above records demonstrates:  Please see elsewhere in the note.  ***   ASSESSMENT AND PLAN:  CAD/NSTEMI:   ***  DM:   Followed by PCP.    Ischemic cardiomyopathy:  ***  May require ICD if LV function does not improve.   Atrial fib, paroxysmal: SR but rate is elevated in the low 100s.  Continue BB and Xarelto. His beta blocker was increased at the last visit as he was in sinus tachycardia.  ***   Acute kidney disease-   ***    Current medicines are reviewed at length with the patient today.  The patient {ACTIONS; HAS/DOES NOT HAVE:19233} concerns regarding medicines.  The following changes have been made:  {PLAN; NO CHANGE:13088:s}  Labs/ tests ordered today include: *** No orders of the defined types were placed in this encounter.  Disposition:   FU with ***    Signed, Minus Breeding, MD  05/15/2016 1:09 PM    [facility]  [facility]

## 2016-05-16 ENCOUNTER — Ambulatory Visit: Payer: Medicare HMO | Admitting: Cardiology

## 2016-05-16 DIAGNOSIS — M79604 Pain in right leg: Secondary | ICD-10-CM | POA: Diagnosis not present

## 2016-05-16 DIAGNOSIS — E1122 Type 2 diabetes mellitus with diabetic chronic kidney disease: Secondary | ICD-10-CM | POA: Diagnosis not present

## 2016-05-16 DIAGNOSIS — I25729 Atherosclerosis of autologous artery coronary artery bypass graft(s) with unspecified angina pectoris: Secondary | ICD-10-CM | POA: Diagnosis not present

## 2016-05-16 DIAGNOSIS — M79605 Pain in left leg: Secondary | ICD-10-CM | POA: Diagnosis not present

## 2016-05-16 DIAGNOSIS — I480 Paroxysmal atrial fibrillation: Secondary | ICD-10-CM | POA: Diagnosis not present

## 2016-05-16 DIAGNOSIS — N189 Chronic kidney disease, unspecified: Secondary | ICD-10-CM | POA: Diagnosis not present

## 2016-05-16 DIAGNOSIS — I130 Hypertensive heart and chronic kidney disease with heart failure and stage 1 through stage 4 chronic kidney disease, or unspecified chronic kidney disease: Secondary | ICD-10-CM | POA: Diagnosis not present

## 2016-05-16 DIAGNOSIS — I5043 Acute on chronic combined systolic (congestive) and diastolic (congestive) heart failure: Secondary | ICD-10-CM | POA: Diagnosis not present

## 2016-05-16 DIAGNOSIS — E1142 Type 2 diabetes mellitus with diabetic polyneuropathy: Secondary | ICD-10-CM | POA: Diagnosis not present

## 2016-05-17 DIAGNOSIS — I5043 Acute on chronic combined systolic (congestive) and diastolic (congestive) heart failure: Secondary | ICD-10-CM | POA: Diagnosis not present

## 2016-05-17 DIAGNOSIS — E1122 Type 2 diabetes mellitus with diabetic chronic kidney disease: Secondary | ICD-10-CM | POA: Diagnosis not present

## 2016-05-17 DIAGNOSIS — M79604 Pain in right leg: Secondary | ICD-10-CM | POA: Diagnosis not present

## 2016-05-17 DIAGNOSIS — I480 Paroxysmal atrial fibrillation: Secondary | ICD-10-CM | POA: Diagnosis not present

## 2016-05-17 DIAGNOSIS — M79605 Pain in left leg: Secondary | ICD-10-CM | POA: Diagnosis not present

## 2016-05-17 DIAGNOSIS — E1142 Type 2 diabetes mellitus with diabetic polyneuropathy: Secondary | ICD-10-CM | POA: Diagnosis not present

## 2016-05-17 DIAGNOSIS — N189 Chronic kidney disease, unspecified: Secondary | ICD-10-CM | POA: Diagnosis not present

## 2016-05-17 DIAGNOSIS — I130 Hypertensive heart and chronic kidney disease with heart failure and stage 1 through stage 4 chronic kidney disease, or unspecified chronic kidney disease: Secondary | ICD-10-CM | POA: Diagnosis not present

## 2016-05-17 DIAGNOSIS — I25729 Atherosclerosis of autologous artery coronary artery bypass graft(s) with unspecified angina pectoris: Secondary | ICD-10-CM | POA: Diagnosis not present

## 2016-05-18 DIAGNOSIS — E1142 Type 2 diabetes mellitus with diabetic polyneuropathy: Secondary | ICD-10-CM | POA: Diagnosis not present

## 2016-05-18 DIAGNOSIS — I480 Paroxysmal atrial fibrillation: Secondary | ICD-10-CM | POA: Diagnosis not present

## 2016-05-18 DIAGNOSIS — I130 Hypertensive heart and chronic kidney disease with heart failure and stage 1 through stage 4 chronic kidney disease, or unspecified chronic kidney disease: Secondary | ICD-10-CM | POA: Diagnosis not present

## 2016-05-18 DIAGNOSIS — E1122 Type 2 diabetes mellitus with diabetic chronic kidney disease: Secondary | ICD-10-CM | POA: Diagnosis not present

## 2016-05-18 DIAGNOSIS — I25729 Atherosclerosis of autologous artery coronary artery bypass graft(s) with unspecified angina pectoris: Secondary | ICD-10-CM | POA: Diagnosis not present

## 2016-05-18 DIAGNOSIS — N189 Chronic kidney disease, unspecified: Secondary | ICD-10-CM | POA: Diagnosis not present

## 2016-05-18 DIAGNOSIS — I5043 Acute on chronic combined systolic (congestive) and diastolic (congestive) heart failure: Secondary | ICD-10-CM | POA: Diagnosis not present

## 2016-05-18 DIAGNOSIS — M79605 Pain in left leg: Secondary | ICD-10-CM | POA: Diagnosis not present

## 2016-05-18 DIAGNOSIS — M79604 Pain in right leg: Secondary | ICD-10-CM | POA: Diagnosis not present

## 2016-05-20 ENCOUNTER — Telehealth: Payer: Self-pay | Admitting: Pharmacist Clinician (PhC)/ Clinical Pharmacy Specialist

## 2016-05-20 NOTE — Telephone Encounter (Signed)
LMOM for patient - saw Dr. Antoine Poche last week, he would be a potentially good candidate for the ORION-10 study.  Asked if pt would please call back to see if there was any interest on his part.

## 2016-05-21 DIAGNOSIS — J9601 Acute respiratory failure with hypoxia: Secondary | ICD-10-CM | POA: Diagnosis not present

## 2016-05-21 DIAGNOSIS — J40 Bronchitis, not specified as acute or chronic: Secondary | ICD-10-CM | POA: Diagnosis not present

## 2016-05-21 DIAGNOSIS — E1122 Type 2 diabetes mellitus with diabetic chronic kidney disease: Secondary | ICD-10-CM | POA: Diagnosis not present

## 2016-05-21 DIAGNOSIS — I5043 Acute on chronic combined systolic (congestive) and diastolic (congestive) heart failure: Secondary | ICD-10-CM | POA: Diagnosis not present

## 2016-05-21 DIAGNOSIS — I130 Hypertensive heart and chronic kidney disease with heart failure and stage 1 through stage 4 chronic kidney disease, or unspecified chronic kidney disease: Secondary | ICD-10-CM | POA: Diagnosis not present

## 2016-05-21 DIAGNOSIS — I2510 Atherosclerotic heart disease of native coronary artery without angina pectoris: Secondary | ICD-10-CM | POA: Diagnosis not present

## 2016-05-21 DIAGNOSIS — I5040 Unspecified combined systolic (congestive) and diastolic (congestive) heart failure: Secondary | ICD-10-CM | POA: Diagnosis not present

## 2016-05-21 DIAGNOSIS — E119 Type 2 diabetes mellitus without complications: Secondary | ICD-10-CM | POA: Diagnosis not present

## 2016-05-22 DIAGNOSIS — I480 Paroxysmal atrial fibrillation: Secondary | ICD-10-CM | POA: Diagnosis not present

## 2016-05-22 DIAGNOSIS — M79604 Pain in right leg: Secondary | ICD-10-CM | POA: Diagnosis not present

## 2016-05-22 DIAGNOSIS — I25729 Atherosclerosis of autologous artery coronary artery bypass graft(s) with unspecified angina pectoris: Secondary | ICD-10-CM | POA: Diagnosis not present

## 2016-05-22 DIAGNOSIS — I5043 Acute on chronic combined systolic (congestive) and diastolic (congestive) heart failure: Secondary | ICD-10-CM | POA: Diagnosis not present

## 2016-05-22 DIAGNOSIS — N189 Chronic kidney disease, unspecified: Secondary | ICD-10-CM | POA: Diagnosis not present

## 2016-05-22 DIAGNOSIS — E1122 Type 2 diabetes mellitus with diabetic chronic kidney disease: Secondary | ICD-10-CM | POA: Diagnosis not present

## 2016-05-22 DIAGNOSIS — I130 Hypertensive heart and chronic kidney disease with heart failure and stage 1 through stage 4 chronic kidney disease, or unspecified chronic kidney disease: Secondary | ICD-10-CM | POA: Diagnosis not present

## 2016-05-22 DIAGNOSIS — M79605 Pain in left leg: Secondary | ICD-10-CM | POA: Diagnosis not present

## 2016-05-22 DIAGNOSIS — E1142 Type 2 diabetes mellitus with diabetic polyneuropathy: Secondary | ICD-10-CM | POA: Diagnosis not present

## 2016-05-24 DIAGNOSIS — M79604 Pain in right leg: Secondary | ICD-10-CM | POA: Diagnosis not present

## 2016-05-24 DIAGNOSIS — I25729 Atherosclerosis of autologous artery coronary artery bypass graft(s) with unspecified angina pectoris: Secondary | ICD-10-CM | POA: Diagnosis not present

## 2016-05-24 DIAGNOSIS — M79605 Pain in left leg: Secondary | ICD-10-CM | POA: Diagnosis not present

## 2016-05-24 DIAGNOSIS — E1122 Type 2 diabetes mellitus with diabetic chronic kidney disease: Secondary | ICD-10-CM | POA: Diagnosis not present

## 2016-05-24 DIAGNOSIS — I130 Hypertensive heart and chronic kidney disease with heart failure and stage 1 through stage 4 chronic kidney disease, or unspecified chronic kidney disease: Secondary | ICD-10-CM | POA: Diagnosis not present

## 2016-05-24 DIAGNOSIS — I480 Paroxysmal atrial fibrillation: Secondary | ICD-10-CM | POA: Diagnosis not present

## 2016-05-24 DIAGNOSIS — N189 Chronic kidney disease, unspecified: Secondary | ICD-10-CM | POA: Diagnosis not present

## 2016-05-24 DIAGNOSIS — I5043 Acute on chronic combined systolic (congestive) and diastolic (congestive) heart failure: Secondary | ICD-10-CM | POA: Diagnosis not present

## 2016-05-24 DIAGNOSIS — E1142 Type 2 diabetes mellitus with diabetic polyneuropathy: Secondary | ICD-10-CM | POA: Diagnosis not present

## 2016-05-28 ENCOUNTER — Telehealth: Payer: Self-pay | Admitting: Internal Medicine

## 2016-05-28 ENCOUNTER — Encounter (HOSPITAL_COMMUNITY): Payer: Self-pay | Admitting: Internal Medicine

## 2016-05-28 DIAGNOSIS — I130 Hypertensive heart and chronic kidney disease with heart failure and stage 1 through stage 4 chronic kidney disease, or unspecified chronic kidney disease: Secondary | ICD-10-CM | POA: Diagnosis not present

## 2016-05-28 DIAGNOSIS — E1122 Type 2 diabetes mellitus with diabetic chronic kidney disease: Secondary | ICD-10-CM | POA: Diagnosis not present

## 2016-05-28 DIAGNOSIS — M79605 Pain in left leg: Secondary | ICD-10-CM | POA: Diagnosis not present

## 2016-05-28 DIAGNOSIS — M79604 Pain in right leg: Secondary | ICD-10-CM | POA: Diagnosis not present

## 2016-05-28 DIAGNOSIS — N189 Chronic kidney disease, unspecified: Secondary | ICD-10-CM | POA: Diagnosis not present

## 2016-05-28 DIAGNOSIS — I480 Paroxysmal atrial fibrillation: Secondary | ICD-10-CM | POA: Diagnosis not present

## 2016-05-28 DIAGNOSIS — E1142 Type 2 diabetes mellitus with diabetic polyneuropathy: Secondary | ICD-10-CM | POA: Diagnosis not present

## 2016-05-28 DIAGNOSIS — I5043 Acute on chronic combined systolic (congestive) and diastolic (congestive) heart failure: Secondary | ICD-10-CM | POA: Diagnosis not present

## 2016-05-28 DIAGNOSIS — I25729 Atherosclerosis of autologous artery coronary artery bypass graft(s) with unspecified angina pectoris: Secondary | ICD-10-CM | POA: Diagnosis not present

## 2016-05-28 NOTE — Telephone Encounter (Signed)
Requesting PT verbal once a week for two weeks.

## 2016-05-28 NOTE — Telephone Encounter (Signed)
Called Leroy [name] and gave verbal okay for: Twice a week PT for Three weeks.   Forwarding to pcp as fyi

## 2016-05-28 NOTE — Progress Notes (Signed)
Mailed patient letter with information about Cardiac Rehab program. MW °

## 2016-05-29 DIAGNOSIS — Z899 Acquired absence of limb, unspecified: Secondary | ICD-10-CM | POA: Diagnosis not present

## 2016-05-29 DIAGNOSIS — I4891 Unspecified atrial fibrillation: Secondary | ICD-10-CM | POA: Diagnosis not present

## 2016-05-29 DIAGNOSIS — E1151 Type 2 diabetes mellitus with diabetic peripheral angiopathy without gangrene: Secondary | ICD-10-CM | POA: Diagnosis not present

## 2016-05-30 ENCOUNTER — Encounter (HOSPITAL_COMMUNITY): Payer: Self-pay | Admitting: Emergency Medicine

## 2016-05-30 ENCOUNTER — Inpatient Hospital Stay (HOSPITAL_COMMUNITY): Payer: Medicare HMO

## 2016-05-30 ENCOUNTER — Emergency Department (HOSPITAL_COMMUNITY): Payer: Medicare HMO

## 2016-05-30 ENCOUNTER — Inpatient Hospital Stay (HOSPITAL_COMMUNITY)
Admission: EM | Admit: 2016-05-30 | Discharge: 2016-06-11 | DRG: 091 | Disposition: A | Discharge status: Skilled Nursing Facility | Payer: Medicare HMO | Attending: Internal Medicine | Admitting: Internal Medicine

## 2016-05-30 DIAGNOSIS — N183 Chronic kidney disease, stage 3 (moderate): Secondary | ICD-10-CM | POA: Diagnosis not present

## 2016-05-30 DIAGNOSIS — R4182 Altered mental status, unspecified: Secondary | ICD-10-CM | POA: Diagnosis present

## 2016-05-30 DIAGNOSIS — J9602 Acute respiratory failure with hypercapnia: Secondary | ICD-10-CM | POA: Diagnosis not present

## 2016-05-30 DIAGNOSIS — Z955 Presence of coronary angioplasty implant and graft: Secondary | ICD-10-CM

## 2016-05-30 DIAGNOSIS — Z794 Long term (current) use of insulin: Secondary | ICD-10-CM | POA: Diagnosis not present

## 2016-05-30 DIAGNOSIS — E1142 Type 2 diabetes mellitus with diabetic polyneuropathy: Secondary | ICD-10-CM | POA: Diagnosis present

## 2016-05-30 DIAGNOSIS — E876 Hypokalemia: Secondary | ICD-10-CM | POA: Diagnosis not present

## 2016-05-30 DIAGNOSIS — E1122 Type 2 diabetes mellitus with diabetic chronic kidney disease: Secondary | ICD-10-CM | POA: Diagnosis present

## 2016-05-30 DIAGNOSIS — I5043 Acute on chronic combined systolic (congestive) and diastolic (congestive) heart failure: Secondary | ICD-10-CM | POA: Diagnosis not present

## 2016-05-30 DIAGNOSIS — G4733 Obstructive sleep apnea (adult) (pediatric): Secondary | ICD-10-CM | POA: Diagnosis present

## 2016-05-30 DIAGNOSIS — I4892 Unspecified atrial flutter: Secondary | ICD-10-CM | POA: Diagnosis present

## 2016-05-30 DIAGNOSIS — R0602 Shortness of breath: Secondary | ICD-10-CM

## 2016-05-30 DIAGNOSIS — I7025 Atherosclerosis of native arteries of other extremities with ulceration: Secondary | ICD-10-CM | POA: Diagnosis not present

## 2016-05-30 DIAGNOSIS — G9340 Encephalopathy, unspecified: Secondary | ICD-10-CM | POA: Diagnosis not present

## 2016-05-30 DIAGNOSIS — I110 Hypertensive heart disease with heart failure: Secondary | ICD-10-CM | POA: Diagnosis not present

## 2016-05-30 DIAGNOSIS — E669 Obesity, unspecified: Secondary | ICD-10-CM | POA: Diagnosis present

## 2016-05-30 DIAGNOSIS — Z89611 Acquired absence of right leg above knee: Secondary | ICD-10-CM | POA: Diagnosis not present

## 2016-05-30 DIAGNOSIS — I480 Paroxysmal atrial fibrillation: Secondary | ICD-10-CM | POA: Diagnosis not present

## 2016-05-30 DIAGNOSIS — R1310 Dysphagia, unspecified: Secondary | ICD-10-CM | POA: Diagnosis present

## 2016-05-30 DIAGNOSIS — I10 Essential (primary) hypertension: Secondary | ICD-10-CM | POA: Diagnosis not present

## 2016-05-30 DIAGNOSIS — I5022 Chronic systolic (congestive) heart failure: Secondary | ICD-10-CM | POA: Diagnosis not present

## 2016-05-30 DIAGNOSIS — R7989 Other specified abnormal findings of blood chemistry: Secondary | ICD-10-CM

## 2016-05-30 DIAGNOSIS — J9601 Acute respiratory failure with hypoxia: Secondary | ICD-10-CM | POA: Diagnosis not present

## 2016-05-30 DIAGNOSIS — N179 Acute kidney failure, unspecified: Secondary | ICD-10-CM | POA: Diagnosis present

## 2016-05-30 DIAGNOSIS — I272 Pulmonary hypertension, unspecified: Secondary | ICD-10-CM | POA: Diagnosis present

## 2016-05-30 DIAGNOSIS — J9600 Acute respiratory failure, unspecified whether with hypoxia or hypercapnia: Secondary | ICD-10-CM | POA: Diagnosis not present

## 2016-05-30 DIAGNOSIS — I482 Chronic atrial fibrillation: Secondary | ICD-10-CM | POA: Diagnosis not present

## 2016-05-30 DIAGNOSIS — T501X5A Adverse effect of loop [high-ceiling] diuretics, initial encounter: Secondary | ICD-10-CM | POA: Diagnosis not present

## 2016-05-30 DIAGNOSIS — I5023 Acute on chronic systolic (congestive) heart failure: Secondary | ICD-10-CM | POA: Diagnosis present

## 2016-05-30 DIAGNOSIS — E11649 Type 2 diabetes mellitus with hypoglycemia without coma: Secondary | ICD-10-CM | POA: Diagnosis not present

## 2016-05-30 DIAGNOSIS — I2510 Atherosclerotic heart disease of native coronary artery without angina pectoris: Secondary | ICD-10-CM | POA: Diagnosis not present

## 2016-05-30 DIAGNOSIS — N182 Chronic kidney disease, stage 2 (mild): Secondary | ICD-10-CM | POA: Diagnosis present

## 2016-05-30 DIAGNOSIS — T40605A Adverse effect of unspecified narcotics, initial encounter: Secondary | ICD-10-CM | POA: Diagnosis present

## 2016-05-30 DIAGNOSIS — I509 Heart failure, unspecified: Secondary | ICD-10-CM

## 2016-05-30 DIAGNOSIS — Z6833 Body mass index (BMI) 33.0-33.9, adult: Secondary | ICD-10-CM

## 2016-05-30 DIAGNOSIS — E785 Hyperlipidemia, unspecified: Secondary | ICD-10-CM | POA: Diagnosis present

## 2016-05-30 DIAGNOSIS — R278 Other lack of coordination: Secondary | ICD-10-CM | POA: Diagnosis not present

## 2016-05-30 DIAGNOSIS — E119 Type 2 diabetes mellitus without complications: Secondary | ICD-10-CM | POA: Diagnosis not present

## 2016-05-30 DIAGNOSIS — I340 Nonrheumatic mitral (valve) insufficiency: Secondary | ICD-10-CM | POA: Diagnosis present

## 2016-05-30 DIAGNOSIS — G92 Toxic encephalopathy: Secondary | ICD-10-CM | POA: Diagnosis not present

## 2016-05-30 DIAGNOSIS — E1151 Type 2 diabetes mellitus with diabetic peripheral angiopathy without gangrene: Secondary | ICD-10-CM | POA: Diagnosis not present

## 2016-05-30 DIAGNOSIS — Z87891 Personal history of nicotine dependence: Secondary | ICD-10-CM

## 2016-05-30 DIAGNOSIS — I25810 Atherosclerosis of coronary artery bypass graft(s) without angina pectoris: Secondary | ICD-10-CM | POA: Diagnosis not present

## 2016-05-30 DIAGNOSIS — R400 Somnolence: Secondary | ICD-10-CM | POA: Diagnosis not present

## 2016-05-30 DIAGNOSIS — I255 Ischemic cardiomyopathy: Secondary | ICD-10-CM

## 2016-05-30 DIAGNOSIS — I130 Hypertensive heart and chronic kidney disease with heart failure and stage 1 through stage 4 chronic kidney disease, or unspecified chronic kidney disease: Secondary | ICD-10-CM | POA: Diagnosis present

## 2016-05-30 DIAGNOSIS — R293 Abnormal posture: Secondary | ICD-10-CM | POA: Diagnosis not present

## 2016-05-30 DIAGNOSIS — Z7901 Long term (current) use of anticoagulants: Secondary | ICD-10-CM

## 2016-05-30 DIAGNOSIS — Z89512 Acquired absence of left leg below knee: Secondary | ICD-10-CM | POA: Diagnosis not present

## 2016-05-30 DIAGNOSIS — I252 Old myocardial infarction: Secondary | ICD-10-CM

## 2016-05-30 DIAGNOSIS — Z79899 Other long term (current) drug therapy: Secondary | ICD-10-CM | POA: Diagnosis not present

## 2016-05-30 DIAGNOSIS — R1311 Dysphagia, oral phase: Secondary | ICD-10-CM | POA: Diagnosis not present

## 2016-05-30 DIAGNOSIS — Z951 Presence of aortocoronary bypass graft: Secondary | ICD-10-CM | POA: Diagnosis not present

## 2016-05-30 DIAGNOSIS — R05 Cough: Secondary | ICD-10-CM | POA: Diagnosis not present

## 2016-05-30 DIAGNOSIS — E1121 Type 2 diabetes mellitus with diabetic nephropathy: Secondary | ICD-10-CM | POA: Diagnosis present

## 2016-05-30 DIAGNOSIS — I959 Hypotension, unspecified: Secondary | ICD-10-CM | POA: Diagnosis not present

## 2016-05-30 DIAGNOSIS — Z89511 Acquired absence of right leg below knee: Secondary | ICD-10-CM

## 2016-05-30 DIAGNOSIS — Z86718 Personal history of other venous thrombosis and embolism: Secondary | ICD-10-CM

## 2016-05-30 DIAGNOSIS — E872 Acidosis: Secondary | ICD-10-CM | POA: Diagnosis present

## 2016-05-30 DIAGNOSIS — Z8673 Personal history of transient ischemic attack (TIA), and cerebral infarction without residual deficits: Secondary | ICD-10-CM

## 2016-05-30 DIAGNOSIS — Z7902 Long term (current) use of antithrombotics/antiplatelets: Secondary | ICD-10-CM

## 2016-05-30 DIAGNOSIS — R531 Weakness: Secondary | ICD-10-CM | POA: Diagnosis not present

## 2016-05-30 DIAGNOSIS — R945 Abnormal results of liver function studies: Secondary | ICD-10-CM

## 2016-05-30 HISTORY — DX: Acute respiratory failure with hypercapnia: J96.02

## 2016-05-30 HISTORY — DX: Acute respiratory failure with hypoxia: J96.01

## 2016-05-30 LAB — BRAIN NATRIURETIC PEPTIDE: B NATRIURETIC PEPTIDE 5: 1357.3 pg/mL — AB (ref 0.0–100.0)

## 2016-05-30 LAB — CBC WITH DIFFERENTIAL/PLATELET
BASOS PCT: 0 %
Basophils Absolute: 0 10*3/uL (ref 0.0–0.1)
Eosinophils Absolute: 0.1 10*3/uL (ref 0.0–0.7)
Eosinophils Relative: 2 %
HEMATOCRIT: 44 % (ref 39.0–52.0)
HEMOGLOBIN: 13.7 g/dL (ref 13.0–17.0)
Lymphocytes Relative: 36 %
Lymphs Abs: 2.2 10*3/uL (ref 0.7–4.0)
MCH: 28.3 pg (ref 26.0–34.0)
MCHC: 31.1 g/dL (ref 30.0–36.0)
MCV: 90.9 fL (ref 78.0–100.0)
MONOS PCT: 12 %
Monocytes Absolute: 0.7 10*3/uL (ref 0.1–1.0)
NEUTROS ABS: 3.2 10*3/uL (ref 1.7–7.7)
NEUTROS PCT: 50 %
Platelets: 183 10*3/uL (ref 150–400)
RBC: 4.84 MIL/uL (ref 4.22–5.81)
RDW: 16.3 % — ABNORMAL HIGH (ref 11.5–15.5)
WBC: 6.3 10*3/uL (ref 4.0–10.5)

## 2016-05-30 LAB — CBG MONITORING, ED
GLUCOSE-CAPILLARY: 36 mg/dL — AB (ref 65–99)
GLUCOSE-CAPILLARY: 92 mg/dL (ref 65–99)
Glucose-Capillary: 74 mg/dL (ref 65–99)

## 2016-05-30 LAB — I-STAT ARTERIAL BLOOD GAS, ED
ACID-BASE EXCESS: 1 mmol/L (ref 0.0–2.0)
Acid-Base Excess: 3 mmol/L — ABNORMAL HIGH (ref 0.0–2.0)
BICARBONATE: 27.6 mmol/L (ref 20.0–28.0)
Bicarbonate: 29.7 mmol/L — ABNORMAL HIGH (ref 20.0–28.0)
O2 SAT: 99 %
O2 Saturation: 100 %
PCO2 ART: 54.9 mmHg — AB (ref 32.0–48.0)
PO2 ART: 528 mmHg — AB (ref 83.0–108.0)
TCO2: 31 mmol/L (ref 0–100)
TCO2: 29 mmol/L (ref 0–100)
pCO2 arterial: 48.9 mmHg — ABNORMAL HIGH (ref 32.0–48.0)
pH, Arterial: 7.341 — ABNORMAL LOW (ref 7.350–7.450)
pH, Arterial: 7.36 (ref 7.350–7.450)
pO2, Arterial: 139 mmHg — ABNORMAL HIGH (ref 83.0–108.0)

## 2016-05-30 LAB — COMPREHENSIVE METABOLIC PANEL
ALBUMIN: 2.8 g/dL — AB (ref 3.5–5.0)
ALT: 44 U/L (ref 17–63)
AST: 49 U/L — ABNORMAL HIGH (ref 15–41)
Alkaline Phosphatase: 265 U/L — ABNORMAL HIGH (ref 38–126)
Anion gap: 8 (ref 5–15)
BUN: 12 mg/dL (ref 6–20)
CO2: 27 mmol/L (ref 22–32)
Calcium: 8.6 mg/dL — ABNORMAL LOW (ref 8.9–10.3)
Chloride: 106 mmol/L (ref 101–111)
Creatinine, Ser: 1.77 mg/dL — ABNORMAL HIGH (ref 0.61–1.24)
GFR calc Af Amer: 43 mL/min — ABNORMAL LOW (ref >60)
GFR calc non Af Amer: 37 mL/min — ABNORMAL LOW (ref >60)
GLUCOSE: 62 mg/dL — AB (ref 65–99)
POTASSIUM: 4.3 mmol/L (ref 3.5–5.1)
SODIUM: 141 mmol/L (ref 135–145)
TOTAL PROTEIN: 6.4 g/dL — AB (ref 6.5–8.1)
Total Bilirubin: 0.5 mg/dL (ref 0.3–1.2)

## 2016-05-30 LAB — URINALYSIS, ROUTINE W REFLEX MICROSCOPIC
Bilirubin Urine: NEGATIVE
GLUCOSE, UA: NEGATIVE mg/dL
HGB URINE DIPSTICK: NEGATIVE
Ketones, ur: NEGATIVE mg/dL
LEUKOCYTES UA: NEGATIVE
NITRITE: NEGATIVE
PH: 5 (ref 5.0–8.0)
Protein, ur: 100 mg/dL — AB
SPECIFIC GRAVITY, URINE: 1.014 (ref 1.005–1.030)

## 2016-05-30 LAB — I-STAT TROPONIN, ED: Troponin i, poc: 0.03 ng/mL (ref 0.00–0.08)

## 2016-05-30 LAB — I-STAT CG4 LACTIC ACID, ED: Lactic Acid, Venous: 1.57 mmol/L (ref 0.5–1.9)

## 2016-05-30 LAB — RAPID URINE DRUG SCREEN, HOSP PERFORMED
Amphetamines: NOT DETECTED
BENZODIAZEPINES: NOT DETECTED
Barbiturates: NOT DETECTED
Cocaine: NOT DETECTED
OPIATES: NOT DETECTED
Tetrahydrocannabinol: NOT DETECTED

## 2016-05-30 LAB — GLUCOSE, CAPILLARY
GLUCOSE-CAPILLARY: 84 mg/dL (ref 65–99)
Glucose-Capillary: 125 mg/dL — ABNORMAL HIGH (ref 65–99)
Glucose-Capillary: 66 mg/dL (ref 65–99)

## 2016-05-30 LAB — PROTIME-INR
INR: 1.25
Prothrombin Time: 15.8 seconds — ABNORMAL HIGH (ref 11.4–15.2)

## 2016-05-30 LAB — MRSA PCR SCREENING: MRSA by PCR: POSITIVE — AB

## 2016-05-30 MED ORDER — CHLORHEXIDINE GLUCONATE CLOTH 2 % EX PADS
6.0000 | MEDICATED_PAD | Freq: Every day | CUTANEOUS | Status: AC
  Administered 2016-05-31 – 2016-06-03 (×4): 6 via TOPICAL

## 2016-05-30 MED ORDER — HEPARIN SODIUM (PORCINE) 5000 UNIT/ML IJ SOLN
5000.0000 [IU] | Freq: Three times a day (TID) | INTRAMUSCULAR | Status: DC
  Administered 2016-05-30 – 2016-05-31 (×2): 5000 [IU] via SUBCUTANEOUS
  Filled 2016-05-30 (×2): qty 1

## 2016-05-30 MED ORDER — ASPIRIN 81 MG PO CHEW
324.0000 mg | CHEWABLE_TABLET | ORAL | Status: DC
Start: 2016-05-30 — End: 2016-05-31

## 2016-05-30 MED ORDER — ASPIRIN 300 MG RE SUPP: 300.0000 mg | RECTAL | Status: DC

## 2016-05-30 MED ORDER — SODIUM CHLORIDE 0.9 % IV SOLN: 250.0000 mL | INTRAVENOUS | Status: DC | PRN

## 2016-05-30 MED ORDER — DEXTROSE 50 % IV SOLN
INTRAVENOUS | Status: AC
  Filled 2016-05-30: qty 50

## 2016-05-30 MED ORDER — FUROSEMIDE 10 MG/ML IJ SOLN: 40.0000 mg | Freq: Once | INTRAMUSCULAR | Status: DC

## 2016-05-30 MED ORDER — SODIUM CHLORIDE 0.9 % IV SOLN: INTRAVENOUS | Status: DC

## 2016-05-30 MED ORDER — NALOXONE HCL 2 MG/2ML IJ SOSY
2.0000 mg | PREFILLED_SYRINGE | Freq: Once | INTRAMUSCULAR | Status: AC
  Administered 2016-05-30: 2 mg via INTRAVENOUS
  Filled 2016-05-30: qty 2

## 2016-05-30 MED ORDER — MUPIROCIN 2 % EX OINT
1.0000 "application " | TOPICAL_OINTMENT | Freq: Two times a day (BID) | CUTANEOUS | Status: AC
  Administered 2016-05-30 – 2016-06-04 (×10): 1 via NASAL
  Filled 2016-05-30 (×2): qty 22

## 2016-05-30 MED ORDER — NALOXONE HCL 2 MG/2ML IJ SOSY
1.0000 mg/h | PREFILLED_SYRINGE | INTRAVENOUS | Status: DC
  Administered 2016-05-30 – 2016-06-01 (×9): 1 mg/h via INTRAVENOUS
  Filled 2016-05-30 (×11): qty 4

## 2016-05-30 MED ORDER — DEXTROSE 50 % IV SOLN
25.0000 mL | Freq: Once | INTRAVENOUS | Status: AC
  Administered 2016-05-30: 25 mL via INTRAVENOUS

## 2016-05-30 MED ORDER — PANTOPRAZOLE SODIUM 40 MG IV SOLR
40.0000 mg | Freq: Every day | INTRAVENOUS | Status: DC
  Administered 2016-05-30 – 2016-05-31 (×2): 40 mg via INTRAVENOUS
  Filled 2016-05-30 (×2): qty 40

## 2016-05-30 NOTE — Procedures (Signed)
Arterial Catheter Insertion Procedure Note Leroy [name] [id number] 06-Oct-1945  Procedure: Insertion of Arterial Catheter  Indications: Blood pressure monitoring  Procedure Details Consent: Unable to obtain consent because of altered level of consciousness. Time Out: Verified patient identification, verified procedure, site/side was marked, verified correct patient position, special equipment/implants available, medications/allergies/relevent history reviewed, required imaging and test results available.  Performed  Maximum sterile technique was used including antiseptics. Skin prep: Chlorhexidine; local anesthetic administered 20 gauge catheter was inserted into left femoral artery using the Seldinger technique.  Evaluation Blood flow poor; BP tracing unable to place . Complications: No apparent complications.   Shelby Mattocks 05/30/2016

## 2016-05-30 NOTE — Progress Notes (Signed)
ABG results given to CCM.  No new RT orders currently.

## 2016-05-30 NOTE — ED Notes (Signed)
A Line insertion attempted x's 2 without success by Anders Simmonds, PA   Pt tolerated well.

## 2016-05-30 NOTE — Progress Notes (Signed)
Pt transported from ED Trauma B to CT to 3M07 on ventilator. Pt stable throughout with no complications. VS within normal limits. RT will continue to monitor.

## 2016-05-30 NOTE — H&P (Signed)
PULMONARY / CRITICAL CARE MEDICINE   Name: Leroy [name] MRN: [id number] DOB: Dec 15, 1945    ADMISSION DATE:  05/30/2016 CONSULTATION DATE:  2/22  REFERRING MD:  tegeler   CHIEF COMPLAINT:  Acute encephalopathy   HISTORY OF PRESENT ILLNESS:   This is a 71 year old male w/ extensive cardiac history including ICM w EF 10%. Found by roommate unresponsive on 2/22. EMS called. He was initially placed on NIPPV, abg obtained showed acute hypercarbic respiratory acidosis. His CXR showed pulmonary edema. His BP was labile. PCCM asked to admit for acute encephalopathy. On PCCM arrival pt administered narcan w/ almost immediate improvement in MS. Will admit to the ICU w/ working dx of narcotic induced encephalopathy and hypercarbia.   PAST MEDICAL HISTORY :  He  has a past medical history of Atrial flutter ([place]); Cataract; Chronic kidney disease; Chronic systolic CHF (congestive heart failure) ([place]); Coronary artery disease; Diabetes mellitus (1979); DVT (deep venous thrombosis) ([place]) (2011); H/O hiatal hernia; H/O medication noncompliance; History of blood clots; Hyperlipidemia; Hypotension; Ischemic cardiomyopathy; Joint pain; Myocardial infarction; Paroxysmal atrial fibrillation ([place]); Peripheral neuropathy ([place]); Peripheral vascular disease ([place]); Shortness of breath dyspnea; and Stroke ([place]).  PAST SURGICAL HISTORY: He  has a past surgical history that includes revasculariztion (2011/2010); Tonsillectomy; Cardiac catheterization (03/19/10); Abdominal aortagram (04-30-12); Adenoidectomy; Amputation (Left, 06/03/2012); Coronary angioplasty (2002); Endarterectomy femoral (Right, 03/25/2013); Patch angioplasty (Right, 03/25/2013); Amputation (Right, 04/14/2013); I&D extremity (Right, 04/14/2013); abdominal aortagram (N/A, 04/30/2012); lower extremity angiogram (Bilateral, 04/30/2012); Eye surgery (Right); Coronary artery bypass graft (03/2000); Hernia repair; Wound debridement (Right, 04/20/2014); I&D extremity  (Right, 04/20/2014); Application if wound vac (Right, 04/20/2014); Colonoscopy with propofol (N/A, 10/24/2015); Cardiac catheterization (N/A, 02/22/2016); Cardiac catheterization (N/A, 02/22/2016); and Cardiac catheterization.  Allergies  Allergen Reactions  . Statins Other (See Comments)    Reaction:  Muscle pain   . Amlodipine Besylate Other (See Comments)    Reaction:  Muscle pain   . Cephalexin Hives    No current facility-administered medications on file prior to encounter.    Current Outpatient Prescriptions on File Prior to Encounter  Medication Sig  . ACCU-CHEK SOFTCLIX LANCETS lancets Use as instructed to check blood sugar three times daily.  DX  E11.51  . albuterol (PROVENTIL HFA;VENTOLIN HFA) 108 (90 Base) MCG/ACT inhaler Inhale 2 puffs into the lungs every 6 (six) hours as needed for wheezing or shortness of breath.  . Alcohol Swabs PADS Dispense swab to be used as directed  . Blood Glucose Monitoring Suppl (ACCU-CHEK AVIVA PLUS) w/Device KIT Use to check blood sugar three times daily.  DX E11.51  . clopidogrel (PLAVIX) 75 MG tablet Take 1 tablet (75 mg total) by mouth daily.  [name] ezetimibe (ZETIA) 10 MG tablet Take 1 tablet (10 mg total) by mouth daily.  . furosemide (LASIX) 80 MG tablet Take 1 tablet (80 mg total) by mouth 2 (two) times daily.  [name] glucose blood (ACCU-CHEK AVIVA PLUS) test strip Use as instructed to check blood sugar three times daily. DX  E11.51  . Indacaterol-Glycopyrrolate (UTIBRON NEOHALER) 27.5-15.6 MCG CAPS Place 1 puff into inhaler and inhale 2 (two) times daily.  . insulin aspart (NOVOLOG) 100 UNIT/ML injection Inject 0-15 Units into the skin 3 (three) times daily with meals.  . insulin aspart (NOVOLOG) 100 UNIT/ML injection Inject 3 Units into the skin 3 (three) times daily with meals.  . Insulin Glargine (LANTUS) 100 UNIT/ML Solostar Pen Inject 25 Units into the skin daily at 10 pm.  . Insulin Pen Needle (B-D  UF III MINI PEN NEEDLES) 31G X 5 MM MISC Use to  inject insulin as directed.  . metoprolol succinate (TOPROL-XL) 25 MG 24 hr tablet Take 1 tablet (25 mg total) by mouth 2 (two) times daily.  . mupirocin cream (BACTROBAN) 2 % Apply 1 application topically 2 (two) times daily.  . nitroGLYCERIN (NITROSTAT) 0.4 MG SL tablet Place 1 tablet (0.4 mg total) under the tongue every 5 (five) minutes as needed for chest pain.  . potassium chloride SA (K-DUR,KLOR-CON) 20 MEQ tablet Take 2 tablets (40 mEq total) by mouth daily.  . rivaroxaban (XARELTO) 20 MG TABS tablet Take 1 tablet (20 mg total) by mouth daily at 6 PM.  . rosuvastatin (CRESTOR) 20 MG tablet Take 20 mg by mouth daily.    FAMILY HISTORY:  His indicated that his mother is deceased. He indicated that his father is deceased. He indicated that the status of his neg hx is unknown.    SOCIAL HISTORY: He  reports that he has quit smoking. His smoking use included Cigarettes. He has never used smokeless tobacco. He reports that he does not drink alcohol or use drugs.  REVIEW OF SYSTEMS:   Unable   SUBJECTIVE:  Difficult to arouse   VITAL SIGNS: BP (!) 103/53 (BP Location: Left Arm)   Pulse 79   Resp 10   Wt (!) 350 lb (158.8 kg)   SpO2 90%   BMI 64.02 kg/m   HEMODYNAMICS:    VENTILATOR SETTINGS: Vent Mode: BIPAP;PCV FiO2 (%):  [100 %] 100 % Set Rate:  [15 bmp] 15 bmp PEEP:  [5 cmH20] 5 cmH20  INTAKE / OUTPUT: No intake/output data recorded.  PHYSICAL EXAMINATION: General:  MOAAM, slow to wake up, but wakes up more rapidly after narcan  Neuro:  Lethargic, moves UEs to command. Now verbal  HEENT:  Neck is large. Pupils are pin point PERRL Cardiovascular:  Distant RRR Lungs:  Clear and w/out accessory use  Abdomen:  Obese + bowel sounds  Musculoskeletal:  LE AKA dry extremities and warm Skin:  Warm and dry   LABS:  BMET  Recent Labs Lab 05/30/16 1325  NA 141  K 4.3  CL 106  CO2 27  BUN 12  CREATININE 1.77*  GLUCOSE 62*    Electrolytes  Recent Labs Lab  05/30/16 1325  CALCIUM 8.6*    CBC  Recent Labs Lab 05/30/16 1325  WBC 6.3  HGB 13.7  HCT 44.0  PLT 183    Coag's  Recent Labs Lab 05/30/16 1325  INR 1.25    Sepsis Markers  Recent Labs Lab 05/30/16 1351  LATICACIDVEN 1.57    ABG  Recent Labs Lab 05/30/16 1340  PHART 7.341*  PCO2ART 54.9*  PO2ART 528.0*    Liver Enzymes  Recent Labs Lab 05/30/16 1325  AST 49*  ALT 44  ALKPHOS 265*  BILITOT 0.5  ALBUMIN 2.8*    Cardiac Enzymes No results for input(s): TROPONINI, PROBNP in the last 168 hours.  Glucose  Recent Labs Lab 05/30/16 1300 05/30/16 1302 05/30/16 1502  GLUCAP 36* 74 92    Imaging Dg Chest Portable 1 View  Result Date: 05/30/2016 CLINICAL DATA:  Short of breath EXAM: PORTABLE CHEST 1 VIEW COMPARISON:  03/25/2016 FINDINGS: Postop CABG. Cardiac enlargement. Vascular congestion with mild edema. Small bilateral effusions bibasilar atelectasis. IMPRESSION: Congestive heart failure with mild edema and small bilateral pleural effusions Electronically Signed   By: Franchot Gallo M.D.   On: 05/30/2016 13:32  STUDIES:  UDS 2/22>>>  CULTURES: UC 2/22>>>  ANTIBIOTICS:   SIGNIFICANT EVENTS:   LINES/TUBES:   DISCUSSION: Acute encephalopathy. Favor narcotic induced hypercarbia as seems to be responding to narcan.  Initial plan: narcan gtt, repeat ABG, decide on intubation, CT head and UDS. Will need ICU admit  -I was not able to get aline  ASSESSMENT / PLAN:  PULMONARY A: Acute hypercarbic respiratory failure  Pulmonary edema vs aspiration  P:   Cont BIPAP for now & repeat ABG following aline placement  Low threshold for intubation Lasix if BP will allow  CARDIOVASCULAR A:  Acute Systolic ICM w/ EF 09-81% grade I diastolic dysfxn  H/o CAD s/p CABG H/o PAF P:  Holding PO meds Will need diuresis when BP will allow Daily assessment as to when to resume lopressor Holding xarelto for now until CT head obtained and MS  will allow him to take it   RENAL A:   Acute on chronic renal failure  P:   MAP goal > 65 Diuresis when BP will allow   GASTROINTESTINAL A:   Obesity  P:   PPI NPO until MS improves  HEMATOLOGIC A:   No acute  P:  Trend cbc  heparin  INFECTIOUS A:   R/o infection  P:   Pan culture   ENDOCRINE A:   DM P:   Trend cbgs ssi  NEUROLOGIC A:   Acute encephalopathy etiology not clear. ? Drugs ? Hypercarbia ? Acute neurologic event  P:   RASS goal: 0 Narcan gtt CT head UDS   FAMILY  - Updates: pending   - Inter-disciplinary family meet or Palliative Care meeting due by: 2/28     Erick Colace ACNP-BC [place] Pager # [phone] OR # [phone] if no answer  05/30/2016, 3:45 PM  STAFF NOTE: I, Merrie Roof, MD FACP have personally reviewed patient's available data, including medical history, events of note, physical examination and test results as part of my evaluation. I have discussed with resident/NP and other care providers such as pharmacist, RN and RRT. In addition, I personally evaluated patient and elicited key findings of: lethargic but now becoming more arousals clearly after narcan, crackles bases, NIMV on face, obese, bilateral BKA with chronic ulcerations, known sys CHF, pcxrc/w pulm edema, chf exacerbation, BNP high, ABG reviewed, no need intubation now but will need repeat abg, will need CT head, place a line and assess BP which has been variable and unclear if has HTN component for chf, maintain NIMV for now, given clinical progress after narcan wil start infusion, get tox screen - wonder if has narcotic component to neurostatus, no evidence infection at this stage, to icu The patient is critically ill with multiple organ systems failure and requires high complexity decision making for assessment and support, frequent evaluation and titration of therapies, application of advanced monitoring technologies and extensive  interpretation of multiple databases.   Critical Care Time devoted to patient care services described in this note is40 Minutes. This time reflects time of care of this signee: Merrie Roof, MD FACP. This critical care time does not reflect procedure time, or teaching time or supervisory time of PA/NP/Med student/Med Resident etc but could involve care discussion time. Rest per NP/medical resident whose note is outlined above and that I agree with   Lavon Paganini. Titus Mould, MD, [place] Pgr: [facility] 05/30/2016 4:42 PM

## 2016-05-30 NOTE — ED Triage Notes (Signed)
Pt to ED via [facility] from home with roommate c/o altered mental status. On arrival-- pt obtunded, will answer questions when his name is called, but does not respond to pain-- - pt has no IV access- difficult stick-- pt has O2 on at 4l/m/[place]-- difficulty obtaining pulse ox due to cold extremeties. Bilateral amputations below knee.  Pt was found in bed, incontinent of large amount of urine-- friend states he has been laying in bed since last night.

## 2016-05-30 NOTE — ED Notes (Signed)
Resp tech called to transport pt to CT and 3M07

## 2016-05-30 NOTE — ED Notes (Signed)
Pt remains obtunded, on bipap-- does respond to voice, but does not stay awake

## 2016-05-30 NOTE — ED Provider Notes (Signed)
[facility] DEPT Provider Note   CSN: [id number] Arrival date & time: 05/30/16  1250     History   Chief Complaint Chief Complaint  Patient presents with  . Altered Mental Status  AMS   HPI [name] is a 71 y.o. male with a past medical history significant for CAD  status post CABG, stroke, hypertension, DVT, atrial flutter on Xarelto and Plavix, CHF, diabetes who presents with altered mental status, cough, chills, and shortness of breath. According to family, patient was normal last night. They report that this morning, he was acting very sleepy and had a cough. Patient was acting short of breath and breathing quickly. They called EMS for evaluation. Patient is somnolent but awakes with loud yelling. Patient denies chest pain, palpitations. Patient does report shortness of breath and cough. He denies abdominal pain, nausea, or vomiting.  He denies fevers but does report some chills. He is somnolent and will only wake up for short sentences. He denies any pains.   HPI  Past Medical History:  Diagnosis Date  . Atrial flutter ([place])   . Cataract   . Chronic kidney disease    on Lisinopril to protect kidneys d/t being on Metformin per pt  . Chronic systolic CHF (congestive heart failure) ([place])   . Coronary artery disease    a. s/p remote CABG 2001 at [facility] b. Cath 03/2010: for med rx.;  c.  [facility] Adam myoview (1/14):  Large Inferior apical MI, very small area of peri-infarct ischemia toward the inferior apical segment. EF 19%.  Med Rx was continued.    . Diabetes mellitus 1979   takes Metformni daily  and Lantus 50units bid  . DVT (deep venous thrombosis) ([place]) 2011   legs  . H/O hiatal hernia   . H/O medication noncompliance    Due to insurance issues  . History of blood clots    in legs--this was in 2011--has been off of Coumadin 56month;takes Xarelto daily  . Hyperlipidemia   . Hypotension   . Ischemic cardiomyopathy    a. EF 40% 2010. b. Echo (2/14):  mild LVH, EF  30-35%, diff HK, Gr 1 DD, MAC, mild MR, mod LAE, PASP 39  . Joint pain   . Myocardial infarction    total of 8 heart attacks;last one in 2011  . Paroxysmal atrial fibrillation (HCC)   . Peripheral neuropathy ([place]   . Peripheral vascular disease ([place]    a. s/p L BKA 2014.  .Marland KitchenShortness of breath dyspnea   . Stroke ([facility]     Patient Active Problem List   Diagnosis Date Noted  . Chronic renal disease, stage 2, mildly decreased glomerular filtration rate (GFR) between 60-89 mL/min/1.73 square meter 04/30/2016  . Chronic bronchitis ([place] 04/30/2016  . Noncompliance 02/23/2016  . NSTEMI (non-ST elevated myocardial infarction) ([place] 02/22/2016  . Essential hypertension 02/22/2016  . Ischemic colitis ([place] 10/25/2015  . Hemispheric carotid artery syndrome   . Coronary artery disease involving coronary bypass graft of native heart with angina pectoris ([place]   . PVD (peripheral vascular disease) ([place]   . Stroke (cerebrum) ([place] 05/22/2015  . Diabetes mellitus type 2 with peripheral artery disease ([place] 05/22/2015  . Stroke due to embolism ([place]   . Microalbuminuria 09/29/2014  . 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 Phantom limb pain ([place] 08/16/2013  . Pain in limb- Right stump 07/06/2013  . Atherosclerosis of native arteries of the extremities with ulceration ([place] 06/25/2013  . S/P bilateral  BKA (below knee amputation) ([place]) 04/21/2013  . Chronic systolic heart failure ([place]) 04/15/2013  . Femoral artery stenosis ([place]) 03/25/2013  . S/P BKA (below knee amputation) bilateral ([place]) 09/10/2012  . SVT (supraventricular tachycardia) ([place]) 09/02/2012  . Hyperlipidemia with target LDL less than 70 01/02/2012  . ATRIAL FIBRILLATION, PAROXYSMAL 04/13/2010    Past Surgical History:  Procedure Laterality Date  . ABDOMINAL AORTAGRAM  04-30-12  . ABDOMINAL AORTAGRAM N/A 04/30/2012   Procedure: ABDOMINAL Maxcine Ham;  Surgeon: Conrad Lomira, MD;  Location: [facility] CATH LAB;   Service: Cardiovascular;  Laterality: N/A;  . ADENOIDECTOMY    . AMPUTATION Left 06/03/2012   Procedure: AMPUTATION BELOW KNEE;  Surgeon: Conrad Hamlin, MD;  Location: [facility];  Service: Vascular;  Laterality: Left;  . AMPUTATION Right 04/14/2013   Procedure: AMPUTATION BELOW KNEE ;  Surgeon: Conrad Swartzville, MD;  Location: [facility];  Service: Vascular;  Laterality: Right;  . APPLICATION OF WOUND VAC Right 04/20/2014   Procedure: APPLICATION OF WOUND VAC;  Surgeon: Conrad [place], MD;  Location: [facility];  Service: Vascular;  Laterality: Right;  . CARDIAC CATHETERIZATION  03/19/10  . CARDIAC CATHETERIZATION N/A 02/22/2016   Procedure: Left Heart Cath and Cors/Grafts Angiography;  Surgeon: Jettie Booze, MD;  Location: [facility] CV LAB;  Service: Cardiovascular;  Laterality: N/A;  . CARDIAC CATHETERIZATION N/A 02/22/2016   Procedure: Coronary Stent Intervention;  Surgeon: Jettie Booze, MD;  Location: [place] CV LAB;  Service: Cardiovascular;  Laterality: N/A;  . CARDIAC CATHETERIZATION    . COLONOSCOPY WITH PROPOFOL N/A 10/24/2015   Procedure: COLONOSCOPY WITH PROPOFOL;  Surgeon: Milus Banister, MD;  Location: [facility] ENDOSCOPY;  Service: Endoscopy;  Laterality: N/A;  . CORONARY ANGIOPLASTY  2002   3 stents  . CORONARY ARTERY BYPASS GRAFT  03/2000   quadruple  . ENDARTERECTOMY FEMORAL Right 03/25/2013   Procedure: ENDARTERECTOMY FEMORAL ;  Surgeon: Conrad [place], MD;  Location: [place];  Service: Vascular;  Laterality: Right;  . EYE SURGERY Right    Catarct  . HERNIA REPAIR     Hiatal Hernia  . I&D EXTREMITY Right 04/14/2013   Procedure: IRRIGATION AND DEBRIDEMENT OF RIGHT  GROIN & PLACEMENT OF VAC DRESSING;  Surgeon: Conrad [place], MD;  Location: [facility];  Service: Vascular;  Laterality: Right;  . I&D EXTREMITY Right 04/20/2014   Procedure: DEBRIDEMENT OF RIGHT BELOW KNEE AMPUTATION STUMP;  Surgeon: Conrad [place]wood, MD;  Location: [place];  Service: Vascular;  Laterality: Right;  . LOWER EXTREMITY  ANGIOGRAM Bilateral 04/30/2012   Procedure: LOWER EXTREMITY ANGIOGRAM;  Surgeon: Conrad Wadsworth, MD;  Location: [facility] CATH LAB;  Service: Cardiovascular;  Laterality: Bilateral;  bilat lower extrem angio  . PATCH ANGIOPLASTY Right 03/25/2013   Procedure: PATCH ANGIOPLASTY USING VASCU-GUARD PERIPHERAL VASCULAR PATCH;  Surgeon: Conrad [place], MD;  Location: [facility];  Service: Vascular;  Laterality: Right;  . revasculariztion  2011/2010   x 2   . TONSILLECTOMY    . WOUND DEBRIDEMENT Right 04/20/2014   s/p  rt bka  with application of wound vac       Home Medications    Prior to Admission medications   Medication Sig Start Date End Date Taking? Authorizing Provider  ACCU-CHEK SOFTCLIX LANCETS lancets Use as instructed to check blood sugar three times daily.  DX  E11.51 04/27/16   Janith Lima, MD  albuterol (PROVENTIL HFA;VENTOLIN HFA) 108 (90 Base) MCG/ACT inhaler Inhale 2 puffs into the lungs  every 6 (six) hours as needed for wheezing or shortness of breath. 03/23/16   Debbrah Alar, NP  Alcohol Swabs PADS Dispense swab to be used as directed 05/02/16   Janith Lima, MD  Blood Glucose Monitoring Suppl (ACCU-CHEK AVIVA PLUS) w/Device KIT Use to check blood sugar three times daily.  DX E11.51 04/27/16   Janith Lima, MD  clopidogrel (PLAVIX) 75 MG tablet Take 1 tablet (75 mg total) by mouth daily. 04/27/16   Janith Lima, MD  ezetimibe (ZETIA) 10 MG tablet Take 1 tablet (10 mg total) by mouth daily. 05/07/13   Lavon Paganini Angiulli, PA-C  furosemide (LASIX) 80 MG tablet Take 1 tablet (80 mg total) by mouth 2 (two) times daily. 04/02/16   Eugenie Filler, MD  glucose blood (ACCU-CHEK AVIVA PLUS) test strip Use as instructed to check blood sugar three times daily. DX  E11.51 05/02/16   Janith Lima, MD  Indacaterol-Glycopyrrolate (UTIBRON NEOHALER) 27.5-15.6 MCG CAPS Place 1 puff into inhaler and inhale 2 (two) times daily. 04/30/16   Janith Lima, MD  insulin aspart (NOVOLOG) 100 UNIT/ML  injection Inject 0-15 Units into the skin 3 (three) times daily with meals. 04/02/16   Eugenie Filler, MD  insulin aspart (NOVOLOG) 100 UNIT/ML injection Inject 3 Units into the skin 3 (three) times daily with meals. 04/02/16   Eugenie Filler, MD  Insulin Glargine (LANTUS) 100 UNIT/ML Solostar Pen Inject 25 Units into the skin daily at 10 pm. 04/02/16   Eugenie Filler, MD  Insulin Pen Needle (B-D UF III MINI PEN NEEDLES) 31G X 5 MM MISC Use to inject insulin as directed. 11/27/15   Janith Lima, MD  metoprolol succinate (TOPROL-XL) 25 MG 24 hr tablet Take 1 tablet (25 mg total) by mouth 2 (two) times daily. 04/27/16   Janith Lima, MD  mupirocin cream (BACTROBAN) 2 % Apply 1 application topically 2 (two) times daily. 05/02/16   Janith Lima, MD  nitroGLYCERIN (NITROSTAT) 0.4 MG SL tablet Place 1 tablet (0.4 mg total) under the tongue every 5 (five) minutes as needed for chest pain. 05/07/13   Lavon Paganini Angiulli, PA-C  potassium chloride SA (K-DUR,KLOR-CON) 20 MEQ tablet Take 2 tablets (40 mEq total) by mouth daily. 04/03/16   Eugenie Filler, MD  rivaroxaban (XARELTO) 20 MG TABS tablet Take 1 tablet (20 mg total) by mouth daily at 6 PM. 02/25/16   Clanford Marisa Hua, MD  rosuvastatin (CRESTOR) 20 MG tablet Take 20 mg by mouth daily.    Historical Provider, MD    Family History Family History  Problem Relation Age of Onset  . Heart disease Mother     before age 35  . Diabetes Mother   . Heart attack Mother   . Stroke Mother   . Heart disease Father   . Colon cancer Neg Hx   . Stomach cancer Neg Hx   . Pancreatic cancer Neg Hx   . Esophageal cancer Neg Hx     Social History Social History  Substance Use Topics  . Smoking status: Former Smoker    Types: Cigarettes  . Smokeless tobacco: Never Used     Comment: quit smoking 27yr ago  . Alcohol use No     Allergies   Statins; Amlodipine besylate; and Cephalexin   Review of Systems Review of Systems  Unable to  perform ROS: Severe respiratory distress  Constitutional: Positive for chills. Negative for diaphoresis, fatigue and fever.  HENT:  Negative for congestion and rhinorrhea.   Eyes: Negative for visual disturbance.  Respiratory: Positive for cough, shortness of breath and wheezing. Negative for chest tightness and stridor.   Cardiovascular: Negative for chest pain, palpitations and leg swelling.  Gastrointestinal: Negative for abdominal pain, constipation, diarrhea, nausea and vomiting.  Genitourinary: Negative for flank pain.  Musculoskeletal: Negative for back pain, neck pain and neck stiffness.  Skin: Negative for rash.  Neurological: Negative for headaches.  Psychiatric/Behavioral: Negative for agitation.  All other systems reviewed and are negative.    Physical Exam Updated Vital Signs BP (!) 127/51   Pulse 68   Temp (!) 96.2 F (35.7 C) (Axillary)   Resp 20   Wt (!) 350 lb (158.8 kg)   SpO2 98%   BMI 64.02 kg/m   Physical Exam  Constitutional: He is oriented to person, place, and time. He appears well-developed and well-nourished. He appears lethargic. He is easily aroused. He appears ill. He appears distressed. Face mask in place.  HENT:  Head: Normocephalic and atraumatic.  Right Ear: External ear normal.  Left Ear: External ear normal.  Nose: Nose normal.  Mouth/Throat: Oropharynx is clear and moist. No oropharyngeal exudate.  Eyes: Conjunctivae and EOM are normal. Pupils are equal, round, and reactive to light.  Neck: Normal range of motion. Neck supple.  Cardiovascular: Normal rate, regular rhythm and intact distal pulses.   No murmur heard. Pulmonary/Chest: Accessory muscle usage present. No stridor. Tachypnea noted. He is in respiratory distress. He has wheezes. He has rhonchi. He has rales. He exhibits no tenderness.  Abdominal: Soft. There is no tenderness. There is no rebound and no guarding.  Musculoskeletal: He exhibits no edema or tenderness.  Neurological:  He is oriented to person, place, and time and easily aroused. He appears lethargic. He displays normal reflexes. No cranial nerve deficit or sensory deficit. He exhibits normal muscle tone. Coordination normal.  Skin: Skin is warm. Capillary refill takes less than 2 seconds. No rash noted. He is not diaphoretic. No erythema.     ED Treatments / Results  Labs (all labs ordered are listed, but only abnormal results are displayed) Labs Reviewed  MRSA PCR SCREENING - Abnormal; Notable for the following:       Result Value   MRSA by PCR POSITIVE (*)    All other components within normal limits  COMPREHENSIVE METABOLIC PANEL - Abnormal; Notable for the following:    Glucose, Bld 62 (*)    Creatinine, Ser 1.77 (*)    Calcium 8.6 (*)    Total Protein 6.4 (*)    Albumin 2.8 (*)    AST 49 (*)    Alkaline Phosphatase 265 (*)    GFR calc non Af Amer 37 (*)    GFR calc Af Amer 43 (*)    All other components within normal limits  CBC WITH DIFFERENTIAL/PLATELET - Abnormal; Notable for the following:    RDW 16.3 (*)    All other components within normal limits  PROTIME-INR - Abnormal; Notable for the following:    Prothrombin Time 15.8 (*)    All other components within normal limits  URINALYSIS, ROUTINE W REFLEX MICROSCOPIC - Abnormal; Notable for the following:    APPearance HAZY (*)    Protein, ur 100 (*)    Bacteria, UA RARE (*)    Squamous Epithelial / LPF 0-5 (*)    All other components within normal limits  BRAIN NATRIURETIC PEPTIDE - Abnormal; Notable for the following:  B Natriuretic Peptide 1,357.3 (*)    All other components within normal limits  TROPONIN I - Abnormal; Notable for the following:    Troponin I 0.04 (*)    All other components within normal limits  CBC - Abnormal; Notable for the following:    Hemoglobin 12.6 (*)    RDW 16.0 (*)    All other components within normal limits  BASIC METABOLIC PANEL - Abnormal; Notable for the following:    Glucose, Bld 62 (*)      Creatinine, Ser 1.52 (*)    Calcium 8.4 (*)    GFR calc non Af Amer 45 (*)    GFR calc Af Amer 52 (*)    All other components within normal limits  GLUCOSE, CAPILLARY - Abnormal; Notable for the following:    Glucose-Capillary 125 (*)    All other components within normal limits  GLUCOSE, CAPILLARY - Abnormal; Notable for the following:    Glucose-Capillary 56 (*)    All other components within normal limits  CBG MONITORING, ED - Abnormal; Notable for the following:    Glucose-Capillary 36 (*)    All other components within normal limits  I-STAT ARTERIAL BLOOD GAS, ED - Abnormal; Notable for the following:    pH, Arterial 7.341 (*)    pCO2 arterial 54.9 (*)    pO2, Arterial 528.0 (*)    Bicarbonate 29.7 (*)    Acid-Base Excess 3.0 (*)    All other components within normal limits  I-STAT ARTERIAL BLOOD GAS, ED - Abnormal; Notable for the following:    pCO2 arterial 48.9 (*)    pO2, Arterial 139.0 (*)    All other components within normal limits  POCT I-STAT 3, ART BLOOD GAS (G3+) - Abnormal; Notable for the following:    pH, Arterial 7.347 (*)    pO2, Arterial 136.0 (*)    All other components within normal limits  CULTURE, BLOOD (ROUTINE X 2)  CULTURE, BLOOD (ROUTINE X 2)  RAPID URINE DRUG SCREEN, HOSP PERFORMED  PROCALCITONIN  MAGNESIUM  PHOSPHORUS  GLUCOSE, CAPILLARY  GLUCOSE, CAPILLARY  GLUCOSE, CAPILLARY  GLUCOSE, CAPILLARY  CBG MONITORING, ED  I-STAT CG4 LACTIC ACID, ED  I-STAT TROPOININ, ED  CBG MONITORING, ED  I-STAT CG4 LACTIC ACID, ED    EKG  EKG Interpretation  Date/Time:  Thursday May 30 2016 12:54:43 EST Ventricular Rate:  67 PR Interval:    QRS Duration: 128 QT Interval:  515 QTC Calculation: 544 R Axis:   106 Text Interpretation:  Right and left arm electrode reversal, interpretation assumes no reversal Sinus rhythm Nonspecific intraventricular conduction delay Probable anterior infarct, age indeterminate Lateral leads are also involved NO  STEMI Confirmed by TEGELER MD, CHRISTOPHER [id number]) on 05/30/2016 7:56:13 PM       Radiology Ct Head Wo Contrast  Result Date: 05/30/2016 CLINICAL DATA:  Altered mental status. EXAM: CT HEAD WITHOUT CONTRAST TECHNIQUE: Contiguous axial images were obtained from the base of the skull through the vertex without intravenous contrast. COMPARISON:  Brain MRI 05/23/2015 FINDINGS: Brain: No acute intracranial hemorrhage. No focal mass lesion. No CT evidence of acute infarction. No midline shift or mass effect. No hydrocephalus. Basilar cisterns are patent. Remote infarction LEFT cerebellum. Generalized cortical atrophy. Periventricular white matter hypodensities. Vascular: No hyperdense vessel or unexpected calcification. Skull: Normal. Negative for fracture or focal lesion. Sinuses/Orbits: Paranasal sinuses and mastoid air cells are clear. Orbits are clear. Other: Subcutaneous lesion LEFT occipital region unchanged (Image 7, series 3). IMPRESSION: 1. No  acute intracranial findings.  \ 2. White matter microvascular disease and atrophy. 3. Remote LEFT cerebellar infarction. Electronically Signed   By: Suzy Bouchard M.D.   On: 05/30/2016 19:03   Dg Chest Portable 1 View  Result Date: 05/30/2016 CLINICAL DATA:  Short of breath EXAM: PORTABLE CHEST 1 VIEW COMPARISON:  03/25/2016 FINDINGS: Postop CABG. Cardiac enlargement. Vascular congestion with mild edema. Small bilateral effusions bibasilar atelectasis. IMPRESSION: Congestive heart failure with mild edema and small bilateral pleural effusions Electronically Signed   By: Franchot Gallo M.D.   On: 05/30/2016 13:32    Procedures Procedures (including critical care time)  CRITICAL CARE Performed by: Gwenyth Allegra Tegeler Total critical care time: 60 minutes Critical care time was exclusive of separately billable procedures and treating other patients. Critical care was necessary to treat or prevent imminent or life-threatening deterioration. Critical  care was time spent personally by me on the following activities: development of treatment plan with patient and/or surrogate as well as nursing, discussions with consultants, evaluation of patient's response to treatment, examination of patient, obtaining history from patient or surrogate, ordering and performing treatments and interventions, ordering and review of laboratory studies, ordering and review of radiographic studies, pulse oximetry and re-evaluation of patient's condition.  EMERGENCY DEPARTMENT  US GUIDANCE EXAM Emergency Ultrasound:  US Guidance for Needle Guidance  INDICATIONS: Difficult vascular access Linear probe used in real-time to visualize location of needle entry through skin.   PERFORMED BY: Myself IMAGES ARCHIVED?: Yes LIMITATIONS: edema and obesity VIEWS USED: Transverse INTERPRETATION: Needle visualized within vein, Right arm and Needle gauge 20    Medications Ordered in ED Medications  naloxone (NARCAN) 4 mg in dextrose 5 % 250 mL infusion (not administered)  naloxone (NARCAN) injection 2 mg (2 mg Intravenous Given 05/30/16 1549)     Initial Impression / Assessment and Plan / ED Course  I have reviewed the triage vital signs and the nursing notes.  Pertinent labs & imaging results that were available during my care of the patient were reviewed by me and considered in my medical decision making (see chart for details).  Clinical Course as of May 30 1614  Thu May 30, 2016  1346 NEUT#: 3.2 [CT]    Clinical Course User Index [CT] Gwenyth Allegra Tegeler, MD    Leroy [name] is a 71 y.o. male with a past medical history significant for CAD  status post CABG, stroke, hypertension, DVT, atrial flutter on Xarelto and Plavix, CHF, diabetes who presents with altered mental status, cough, chills, and shortness of breath.  History and exam are seen above.  On arrival, patient is in respiratory distress. When patient is healed up, he will wake up and answer  questions. He denies taking any medications and denies taking pain medications. Patient's initial pupils were 3 mm and reactive bilaterally, not pinpoint. Patient's lungs had wheezing, rhonchi, and rales. She has bilateral below-the-knee amputations. Patient appears edematous all over his body. Patient has no abdominal tenderness or chest tenderness. Unable to follow all commands and had no focal neurologic deficits on my exam.  Given patient's ability to wake up when being spoken with, patient felt appropriate to attempt BiPAP.   Suspect fluid overload as cause of symptoms in the exam. Patient did have weak bilateral radial pulses however his blood pressure on the cuff showed hypertension.  Patient received breathing treatment with improvement in wheezing. X-ray showed concern for CHF.  EKG did not show evidence of STEMI and the patient denied any chest  pain.  Laboratory testing showed a BNP of 1300. Suspect this correlates with fluid overload. Patient also found to have acute kidney injury. No evidence of acute infection. Initial troponin negative.  Remained on BiPAP. Due to continued respiratory knee, there was consult for management recommendations. Due to weak pulses and inconsistent measuring of blood pressure, the decided to hold off on administering nitroglycerin.  Patient admitted to critical care service for further management.   Final Clinical Impressions(s) / ED Diagnoses   Final diagnoses:  Altered mental status, unspecified altered mental status type  Shortness of breath      Clinical Impression: 1. Altered mental status, unspecified altered mental status type   2. Shortness of breath     Disposition: Admit to Critical care service    Courtney Paris, MD 05/31/16 [phone]

## 2016-05-30 NOTE — ED Notes (Signed)
When applying condom cath to pt after telling pt what we were doing pt preceded to grab tech at the neck.  [employer] and security at bedside.

## 2016-05-30 NOTE — ED Notes (Signed)
Pt more alert after Narcan 2mg . IV given.  Pt answering questions.  St's he does take pain meds.but doesn't know the name of it.

## 2016-05-31 ENCOUNTER — Encounter (HOSPITAL_COMMUNITY): Payer: Self-pay | Admitting: Internal Medicine

## 2016-05-31 ENCOUNTER — Inpatient Hospital Stay (HOSPITAL_COMMUNITY): Payer: Medicare HMO

## 2016-05-31 DIAGNOSIS — J9601 Acute respiratory failure with hypoxia: Secondary | ICD-10-CM

## 2016-05-31 DIAGNOSIS — J9602 Acute respiratory failure with hypercapnia: Secondary | ICD-10-CM

## 2016-05-31 HISTORY — DX: Acute respiratory failure with hypoxia: J96.01

## 2016-05-31 LAB — POCT I-STAT 3, ART BLOOD GAS (G3+)
Acid-Base Excess: 3 mmol/L — ABNORMAL HIGH (ref 0.0–2.0)
Bicarbonate: 26.1 mmol/L (ref 20.0–28.0)
Bicarbonate: 27.6 mmol/L (ref 20.0–28.0)
O2 SAT: 99 %
O2 Saturation: 98 %
PCO2 ART: 47.4 mmHg (ref 32.0–48.0)
PH ART: 7.347 — AB (ref 7.350–7.450)
Patient temperature: 97.7
Patient temperature: 97.4
TCO2: 28 mmol/L (ref 0–100)
TCO2: 29 mmol/L (ref 0–100)
pCO2 arterial: 38 mmHg (ref 32.0–48.0)
pH, Arterial: 7.467 — ABNORMAL HIGH (ref 7.350–7.450)
pO2, Arterial: 136 mmHg — ABNORMAL HIGH (ref 83.0–108.0)
pO2, Arterial: 99 mmHg (ref 83.0–108.0)

## 2016-05-31 LAB — BASIC METABOLIC PANEL
Anion gap: 5 (ref 5–15)
BUN: 10 mg/dL (ref 6–20)
CHLORIDE: 108 mmol/L (ref 101–111)
CO2: 28 mmol/L (ref 22–32)
CREATININE: 1.52 mg/dL — AB (ref 0.61–1.24)
Calcium: 8.4 mg/dL — ABNORMAL LOW (ref 8.9–10.3)
GFR calc non Af Amer: 45 mL/min — ABNORMAL LOW (ref >60)
GFR, EST AFRICAN AMERICAN: 52 mL/min — AB (ref >60)
Glucose, Bld: 62 mg/dL — ABNORMAL LOW (ref 65–99)
POTASSIUM: 4.3 mmol/L (ref 3.5–5.1)
SODIUM: 141 mmol/L (ref 135–145)

## 2016-05-31 LAB — GLUCOSE, CAPILLARY
GLUCOSE-CAPILLARY: 100 mg/dL — AB (ref 65–99)
GLUCOSE-CAPILLARY: 119 mg/dL — AB (ref 65–99)
GLUCOSE-CAPILLARY: 56 mg/dL — AB (ref 65–99)
GLUCOSE-CAPILLARY: 82 mg/dL (ref 65–99)
GLUCOSE-CAPILLARY: 91 mg/dL (ref 65–99)
Glucose-Capillary: 72 mg/dL (ref 65–99)
Glucose-Capillary: 53 mg/dL — ABNORMAL LOW (ref 65–99)
Glucose-Capillary: 71 mg/dL (ref 65–99)

## 2016-05-31 LAB — PROCALCITONIN

## 2016-05-31 LAB — CBC
HEMATOCRIT: 40.9 % (ref 39.0–52.0)
HEMOGLOBIN: 12.6 g/dL — AB (ref 13.0–17.0)
MCH: 27.8 pg (ref 26.0–34.0)
MCHC: 30.8 g/dL (ref 30.0–36.0)
MCV: 90.1 fL (ref 78.0–100.0)
Platelets: 163 10*3/uL (ref 150–400)
RBC: 4.54 MIL/uL (ref 4.22–5.81)
RDW: 16 % — ABNORMAL HIGH (ref 11.5–15.5)
WBC: 5.9 10*3/uL (ref 4.0–10.5)

## 2016-05-31 LAB — TROPONIN I: Troponin I: 0.04 ng/mL (ref <0.03)

## 2016-05-31 LAB — BASIC METABOLIC PANEL WITH GFR
Anion gap: 7 (ref 5–15)
BUN: 9 mg/dL (ref 6–20)
CO2: 27 mmol/L (ref 22–32)
Calcium: 8.5 mg/dL — ABNORMAL LOW (ref 8.9–10.3)
Chloride: 109 mmol/L (ref 101–111)
Creatinine, Ser: 1.57 mg/dL — ABNORMAL HIGH (ref 0.61–1.24)
GFR calc Af Amer: 50 mL/min — ABNORMAL LOW
GFR calc non Af Amer: 43 mL/min — ABNORMAL LOW
Glucose, Bld: 62 mg/dL — ABNORMAL LOW (ref 65–99)
Potassium: 4.5 mmol/L (ref 3.5–5.1)
Sodium: 143 mmol/L (ref 135–145)

## 2016-05-31 LAB — PHOSPHORUS: PHOSPHORUS: 3.5 mg/dL (ref 2.5–4.6)

## 2016-05-31 LAB — MAGNESIUM: MAGNESIUM: 2 mg/dL (ref 1.7–2.4)

## 2016-05-31 LAB — APTT: aPTT: >200 s (ref 24–36)

## 2016-05-31 LAB — HEPARIN LEVEL (UNFRACTIONATED): Heparin Unfractionated: 0.99 IU/mL — ABNORMAL HIGH (ref 0.30–0.70)

## 2016-05-31 MED ORDER — ORAL CARE MOUTH RINSE
15.0000 mL | Freq: Two times a day (BID) | OROMUCOSAL | Status: DC
Start: 2016-05-31 — End: 2016-06-01
  Administered 2016-05-31 (×2): 15 mL via OROMUCOSAL

## 2016-05-31 MED ORDER — CHLORHEXIDINE GLUCONATE 0.12 % MT SOLN
15.0000 mL | Freq: Two times a day (BID) | OROMUCOSAL | Status: DC
  Administered 2016-05-31 – 2016-06-01 (×2): 15 mL via OROMUCOSAL
  Filled 2016-05-31: qty 15

## 2016-05-31 MED ORDER — DEXTROSE 5 % IV SOLN
INTRAVENOUS | Status: DC
  Administered 2016-05-31 – 2016-06-01 (×2): via INTRAVENOUS

## 2016-05-31 MED ORDER — HEPARIN (PORCINE) IN NACL 100-0.45 UNIT/ML-% IJ SOLN
1150.0000 [IU]/h | INTRAMUSCULAR | Status: DC
  Administered 2016-05-31: 1700 [IU]/h via INTRAVENOUS
  Administered 2016-06-01: 1450 [IU]/h via INTRAVENOUS
  Filled 2016-05-31 (×3): qty 250

## 2016-05-31 MED ORDER — IPRATROPIUM-ALBUTEROL 0.5-2.5 (3) MG/3ML IN SOLN
3.0000 mL | Freq: Four times a day (QID) | RESPIRATORY_TRACT | Status: DC
  Administered 2016-05-31 – 2016-06-02 (×9): 3 mL via RESPIRATORY_TRACT
  Filled 2016-05-31 (×9): qty 3

## 2016-05-31 MED ORDER — FUROSEMIDE 10 MG/ML IJ SOLN
80.0000 mg | Freq: Three times a day (TID) | INTRAMUSCULAR | Status: DC
  Administered 2016-05-31 – 2016-06-03 (×10): 80 mg via INTRAVENOUS
  Filled 2016-05-31 (×10): qty 8

## 2016-05-31 MED ORDER — HEPARIN BOLUS VIA INFUSION
2000.0000 [IU] | Freq: Once | INTRAVENOUS | Status: AC
  Administered 2016-05-31: 2000 [IU] via INTRAVENOUS
  Filled 2016-05-31: qty 2000

## 2016-05-31 MED ORDER — DEXTROSE 50 % IV SOLN
INTRAVENOUS | Status: AC
  Administered 2016-05-31: 09:00:00
  Filled 2016-05-31: qty 50

## 2016-05-31 NOTE — Progress Notes (Signed)
Hypoglycemic Event  CBG: 66  Treatment: 25 ML of d 50  Symptoms: none  Follow-up CBG: Time:0000 CBG Result:125  Possible Reasons for Event: Pt has been NPO since admission    Leroy [name]

## 2016-05-31 NOTE — Assessment & Plan Note (Addendum)
Improved with narcan gtt. Resolved. Not clear what happened because family deny opioid and UDS negative but narcan helped. HAs classsic OSA hx  Plan Dc narcan Monitor Do CPAP QHS Needs opd sleep eval

## 2016-05-31 NOTE — Progress Notes (Signed)
ANTICOAGULATION CONSULT NOTE - Initial Consult  Pharmacy Consult for heparin Indication: atrial fibrillation  Allergies  Allergen Reactions  . Statins Other (See Comments)    Reaction:  Muscle pain   . Amlodipine Besylate Other (See Comments)    Reaction:  Muscle pain   . Cephalexin Hives    Patient Measurements: Height: _0  (157.5 cm) Weight: 288 lb 12.8 oz (131 kg) IBW/kg (Calculated) : 54.6  Vital Signs: Temp: 97.4 F (36.3 C) (02/23 1200) Temp Source: Oral (02/23 1200) BP: 106/91 (02/23 1200) Pulse Rate: 71 (02/23 1200)  Labs:  Recent Labs  05/30/16 1325 05/31/16 0610  HGB 13.7 12.6*  HCT 44.0 40.9  PLT 183 163  LABPROT 15.8*  --   INR 1.25  --   CREATININE 1.77* 1.52*  TROPONINI  --  0.04*    Estimated Creatinine Clearance: 55.3 mL/min (by C-G formula based on SCr of 1.52 mg/dL (H)).   Medical History: Past Medical History:  Diagnosis Date  . Acute respiratory failure with hypoxia and hypercapnia ([place]) 05/31/2016  . Atrial flutter ([place])   . Cataract   . Chronic kidney disease    on Lisinopril to protect kidneys d/t being on Metformin per pt  . Chronic systolic CHF (congestive heart failure) ([place])   . Coronary artery disease    a. s/p remote CABG 2001 at [facility] b. Cath 03/2010: for med rx.;  c.  [facility] Adam myoview (1/14):  Large Inferior apical MI, very small area of peri-infarct ischemia toward the inferior apical segment. EF 19%.  Med Rx was continued.    . Diabetes mellitus 1979   takes Metformni daily  and Lantus 50units bid  . DVT (deep venous thrombosis) ([place]) 2011   legs  . H/O hiatal hernia   . H/O medication noncompliance    Due to insurance issues  . History of blood clots    in legs--this was in 2011--has been off of Coumadin 44month;takes Xarelto daily  . Hyperlipidemia   . Hypotension   . Ischemic cardiomyopathy    a. EF 40% 2010. b. Echo (2/14):  mild LVH, EF 30-35%, diff HK, Gr 1 DD, MAC, mild MR, mod LAE, PASP 39  . Joint pain   .  Myocardial infarction    total of 8 heart attacks;last one in 2011  . Paroxysmal atrial fibrillation ([facility])   . [place]ipheral [place] ([place]   . Peripheral vascular disease ([place]    a. s/p L BKA 2014.  .Marland KitchenShortness of breath dyspnea   . Stroke ([facility]     Medications:  Infusions:  . sodium chloride    . dextrose 50 mL/hr at 05/31/16 1212  . heparin    . naLOXone ([facility] adult infusion for OVERDOSE 1 mg/hr (05/31/16 1200)    Assessment: 70 yom to start IV heparin for afib. He is prescribed xarelto but is not taking. CBC is WNL. Pt had a dose of subcutaneous heparin this morning.   Goal of Therapy:  Heparin level 0.3-0.7 units/ml Monitor platelets by anticoagulation protocol: Yes   Plan:  Heparin bolus 2000 units IV x 1 - conservative bolus due to recent SQ heparin Heparin gtt 1700 units/hr - has bene previously therapeutic on this regimen Check an 8 hr heparin level and aPTT Daily heparin level, aPTT and CBC  Legacy Carrender, RRande Lawman2/23/2018,12:44 PM

## 2016-05-31 NOTE — Progress Notes (Signed)
Pt taken off bipap and placed on 6L [place] with humidity. Pt with bilateral I/e wheezes and crackles in bases. Duonebs orders and given. RN aware. RT will continue to monitor

## 2016-05-31 NOTE — Progress Notes (Signed)
ANTICOAGULATION CONSULT NOTE - Initial Consult  Pharmacy Consult for heparin Indication: atrial fibrillation  Allergies  Allergen Reactions  . Statins Other (See Comments)    Reaction:  Muscle pain   . Amlodipine Besylate Other (See Comments)    Reaction:  Muscle pain   . Cephalexin Hives    Patient Measurements: Height: 5\' 2"  (157.5 cm) Weight: 288 lb 12.8 oz (131 kg) IBW/kg (Calculated) : 54.6  Heparin dosing wt: 87kg  Vital Signs: Temp: 97.8 F (36.6 C) (02/23 1945) Temp Source: Axillary (02/23 1945) BP: 110/39 (02/23 1900) Pulse Rate: 75 (02/23 1900)  Labs:  Recent Labs  05/30/16 1325 05/31/16 0610 05/31/16 1633 05/31/16 2036  HGB 13.7 12.6*  --   --   HCT 44.0 40.9  --   --   PLT 183 163  --   --   APTT  --   --   --  >200*  LABPROT 15.8*  --   --   --   INR 1.25  --   --   --   HEPARINUNFRC  --   --   --  0.99*  CREATININE 1.77* 1.52* 1.57*  --   TROPONINI  --  0.04*  --   --     Estimated Creatinine Clearance: 53.5 mL/min (by C-G formula based on SCr of 1.57 mg/dL (H)).    Medications:  Infusions:  . sodium chloride    . dextrose 75 mL/hr at 05/31/16 1900  . heparin 1,700 Units/hr (05/31/16 1900)  . naLOXone [facility]) adult infusion for OVERDOSE 1 mg/hr (05/31/16 1900)    Assessment: 70 yom to start IV heparin for afib. He is prescribed xarelto but is not taking. CBC is WNL. Pt had a dose of subcutaneous heparin this morning.  -PT/INR = 15.8/1.25 (would anticipate INR elevation if he was compliant with xarelto)  -Heparin level= 0.99, aPTT >200  The heparin level is likely more reliable than the aPTT  Goal of Therapy:  Heparin level 0.3-0.7 units/ml Monitor platelets by anticoagulation protocol: Yes   Plan:   -Hold heparin for 30 minutes then decrease infusion to 1450 units/hr -Heparin level in 6 hours and daily wth CBC daily  Harland German, Pharm D 05/31/2016 10:04 PM

## 2016-05-31 NOTE — Consult Note (Addendum)
WOC Nurse wound consult note Reason for Consult:  Consult requested for BLE. Pt had surgeries previously for amputations; stumps are healed.   Wound type: Left leg above stump with dry eschar; no odor, drainage, or fluctuance.  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 Calciphylaxis  can only be diagnosed by biopsy: if desired, then please consult surgical team to perform. It is appropriate to leave dry stable eschar intact, no topical treatment is indicated at this time. If further input is desired, please consult vascular service, who has followed patient in the past. Pt is obtunded and unable to answer questions or discuss plan of care at this time. Please re-consult if further assistance is needed.  Thank-you,  Cammie Mcgee MSN, RN, CWOCN, [place], CNS [phone]

## 2016-05-31 NOTE — Progress Notes (Signed)
PULMONARY / CRITICAL CARE MEDICINE   Name: Leroy [name] MRN: [id number] DOB: 1945-05-17    ADMISSION DATE:  05/30/2016 CONSULTATION DATE:  05/30/2016  REFERRING MD:  Tegeler, EDP  CHIEF COMPLAINT:  Acute Encephalopathy   Brief: 71 year old male w/ extensive cardiac history including ICM w EF 10%. Found by roommate unresponsive on 2/22. EMS called. He was initially placed on NIPPV, abg obtained showed acute hypercarbic respiratory acidosis. His CXR showed pulmonary edema. His BP was labile. PCCM asked to admit for acute encephalopathy. On PCCM arrival pt administered narcan w/ almost immediate improvement in MS. Will admit to the ICU w/ working dx of narcotic induced encephalopathy and hypercarbia  SUBJECTIVE:  Off Bipap on 6L [place]. Narcan off.   VITAL SIGNS: BP 101/68   Pulse 70   Temp 97.9 F (36.6 C) (Oral)   Resp (!) 26   Wt 131 kg (288 lb 12.8 oz)   SpO2 100%   BMI 52.82 kg/m   HEMODYNAMICS:    VENTILATOR SETTINGS: Vent Mode: BIPAP FiO2 (%):  [50 %-100 %] 50 % Set Rate:  [15 bmp] 15 bmp PEEP:  [5 cmH20] 5 cmH20 Plateau Pressure:  [12 cmH20] 12 cmH20  INTAKE / OUTPUT: I/O last 3 completed shifts: In: 900 [I.V.:900] Out: -   PHYSICAL EXAMINATION: General:  Adult male, no distress  Neuro:  Obtunded, but arousable, follows commands  HEENT:  Normocephalic  Cardiovascular:  Regular Rate, no MRG, NI S1/S2 Lungs:  Crackles noted throughout Abdomen:  Obese, active bowel sounds  Musculoskeletal:  No acute  Skin:  Warm, dry, intact   LABS:  BMET  Recent Labs Lab 05/30/16 1325 05/31/16 0610  NA 141 141  K 4.3 4.3  CL 106 108  CO2 27 28  BUN 12 10  CREATININE 1.77* 1.52*  GLUCOSE 62* 62*    Electrolytes  Recent Labs Lab 05/30/16 1325 05/31/16 0610  CALCIUM 8.6* 8.4*  MG  --  2.0  PHOS  --  3.5    CBC  Recent Labs Lab 05/30/16 1325 05/31/16 0610  WBC 6.3 5.9  HGB 13.7 12.6*  HCT 44.0 40.9  PLT 183 163    Coag's  Recent Labs Lab  05/30/16 1325  INR 1.25    Sepsis Markers  Recent Labs Lab 05/30/16 1351 05/31/16 0610  LATICACIDVEN 1.57  --   PROCALCITON  --  <0.10    ABG  Recent Labs Lab 05/30/16 1340 05/30/16 1646 05/31/16 0256  PHART 7.341* 7.360 7.347*  PCO2ART 54.9* 48.9* 47.4  PO2ART 528.0* 139.0* 136.0*    Liver Enzymes  Recent Labs Lab 05/30/16 1325  AST 49*  ALT 44  ALKPHOS 265*  BILITOT 0.5  ALBUMIN 2.8*    Cardiac Enzymes  Recent Labs Lab 05/31/16 0610  TROPONINI 0.04*    Glucose  Recent Labs Lab 05/30/16 1956 05/30/16 2324 05/31/16 0002 05/31/16 0331 05/31/16 0743 05/31/16 0914  GLUCAP 84 66 125* 82 56* 91    Imaging Ct Head Wo Contrast  Result Date: 05/30/2016 CLINICAL DATA:  Altered mental status. EXAM: CT HEAD WITHOUT CONTRAST TECHNIQUE: Contiguous axial images were obtained from the base of the skull through the vertex without intravenous contrast. COMPARISON:  Brain MRI 05/23/2015 FINDINGS: Brain: No acute intracranial hemorrhage. No focal mass lesion. No CT evidence of acute infarction. No midline shift or mass effect. No hydrocephalus. Basilar cisterns are patent. Remote infarction LEFT cerebellum. Generalized cortical atrophy. Periventricular white matter hypodensities. Vascular: No hyperdense vessel or unexpected calcification. Skull:  Normal. Negative for fracture or focal lesion. Sinuses/Orbits: Paranasal sinuses and mastoid air cells are clear. Orbits are clear. Other: Subcutaneous lesion LEFT occipital region unchanged (Image 7, series 3). IMPRESSION: 1. No acute intracranial findings.  \ 2. White matter microvascular disease and atrophy. 3. Remote LEFT cerebellar infarction. Electronically Signed   By: Genevive Bi M.D.   On: 06/17/16 19:03   Dg Chest Port 1 View  Result Date: 05/31/2016 CLINICAL DATA:  Cough and wheezing EXAM: PORTABLE CHEST 1 VIEW COMPARISON:  June 17, 2016 FINDINGS: There is persistent cardiomegaly with pulmonary venous  hypertension. Currently there is no appreciable edema or consolidation. No pleural effusions evident. No adenopathy. Patient is status post median sternotomy. No bone lesions. IMPRESSION: Pulmonary vascular congestion without frank edema or consolidation. Stable cardiac enlargement. Electronically Signed   By: Bretta Bang III M.D.   On: 05/31/2016 07:32   Dg Chest Portable 1 View  Result Date: 17-Jun-2016 CLINICAL DATA:  Short of breath EXAM: PORTABLE CHEST 1 VIEW COMPARISON:  03/25/2016 FINDINGS: Postop CABG. Cardiac enlargement. Vascular congestion with mild edema. Small bilateral effusions bibasilar atelectasis. IMPRESSION: Congestive heart failure with mild edema and small bilateral pleural effusions Electronically Signed   By: Marlan Palau M.D.   On: Jun 17, 2016 13:32     STUDIES:  UDS Jun 17, 2022 > Neg CT Head June 17, 2022 > No acute, remote left cerebellar infarction   CULTURES: MRSA By PCR 17-Jun-2022 > Pos Blood 06/17/22 >>   ANTIBIOTICS: None.   SIGNIFICANT EVENTS: 06/17/2022 > Presents to ED AMS   LINES/TUBES: None.   ASSESSMENT / PLAN:  PULMONARY A: At risk for intubation  Acute Hypercarbic/Hypoxic Respiratory Failure  Pulmonary Edema  OSA P:   ABG now  Low Threshold for intubation  Continue BIPAP Maintain Oxygenation >92  CARDIOVASCULAR A:  Acute on Chronic Systolic HF (EF 10-15, G1DD) H/O CAD s/p CABG, PAF P:  Cardiac Monitoring   Heparin Gtt (for home xarleto)  Maintain MAP >65   RENAL A:   CKD (Baseline Crt 1.5) P:   Diuresis > Lasix 80 TID  Trend BMP Replace electrolytes as needed   GASTROINTESTINAL A:   Dysphagia  P:   NPO PPI  HEMATOLOGIC A:   Anticoagulation secondary to Afib  P:  Trend CBC Heparin per Pharmacy   INFECTIOUS A:   No issues  P:   Trend WBC and Fever Curve  Follow culture data   ENDOCRINE A:   Hypoglycemia  P:   D5W @ 50 ml/hr Glucose Q4H  NEUROLOGIC A:   Acute Encephalopathy unclear etiology  -CT Head Negative, UDS  negative  P:   Monitor  RASS goal: 0    FAMILY  - Updates: no family at bedside   - Inter-disciplinary family meet or Palliative Care meeting due by:  3/1    Jovita Kussmaul, AG-ACNP  Pulmonary & Critical Care  Pgr: [phone]  PCCM Pgr: [phone]

## 2016-05-31 NOTE — Progress Notes (Signed)
RT attempted to put pt on bipap per MD order. Bipap not picking up pt breathing (very shallow) and bipap just auto-triggering with high leak and ultimately going into stand-by. Ventilator switched out to ensure it was pt and not machine. New Ventilator, new circuit, new filter, and new mask and problem continued. 2nd and 3rd RTs at bedside to troubleshoot. No problems found with machine or equipment, all in agreement it was due to pt's very shallow breathing. Pt taken off bipap, Dr Marchelle Gearing made aware, pt remains on [place] in no distress, no increased WOB, VS within normal limits. RN at bedside and aware. RT will continue to closely monitor pt.

## 2016-05-31 NOTE — Progress Notes (Signed)
eLink Physician-Brief Progress Note Patient Name: ANTAWON MAYHUGH DOB: December 09, 1945 MRN: [id number]   Date of Service  05/31/2016  HPI/Events of Note  Several episodes of hypoglycemia today. Currently on D5W at 50 mL/hour. Patient awake and requests diet.   eICU Interventions  Will order: 1. Increase D5W IV infusion to 75 mL/hour.  2. NPO except ice chips and sips with meds.      Intervention Category Major Interventions: Other:  Lenell Antu 05/31/2016, 6:57 PM

## 2016-05-31 NOTE — Assessment & Plan Note (Addendum)
Improved. Did not tolerate bippa. Daughter giving classic OSA symptoms  Plan - o2 for pulse ox > 88% - auto cpap qhs - duoneb as before (in [place] at home, unclear if he needs it; needs sorting out)

## 2016-05-31 NOTE — Progress Notes (Signed)
Pt is now awake, staring at me, scratching chin and flinging head around. He will not speak to me or follow my commands, which is a change from being drowsy, but able to say his name and "Leroy [name]" and squeeze my hands.

## 2016-05-31 NOTE — Progress Notes (Signed)
[place] PCCM STAFF MD NOTE   STAFF NOTE: I, Dr Lavinia Sharps have personally reviewed patient's available data, including medical history, events of note, physical examination and test results as part of my evaluation. I have discussed with resident/NP and other care providers such as pharmacist, RN and RRT.  In addition,  I personally evaluated patient and elicited key findings of   S: Off bipap on 6L [place]. Not intubated. Very sleepy but arousable per RN . Hx noted that he had immediate improvement in ER with narcan in terms of mental status. Currently narcan ran out 30 min ago but RN does not think that made him more sleepy.   Ef 10% in nov 2017  O: Morbidly obese Very sleepy Edema + Shallow breathing + [place] class 4   Recent Labs Lab 05/30/16 1340 05/30/16 1646 05/31/16 0256  PHART 7.341* 7.360 7.347*  PCO2ART 54.9* 48.9* 47.4  PO2ART 528.0* 139.0* 136.0*  HCO3 29.7* 27.6 26.1  TCO2 31 29 28   O2SAT 100.0 99.0 99.0     Recent Labs Lab 05/31/16 0610  TROPONINI 0.04*     Recent Labs Lab 05/30/16 1325 05/31/16 0610  NA 141 141  K 4.3 4.3  CL 106 108  CO2 27 28  GLUCOSE 62* 62*  BUN 12 10  CREATININE 1.77* 1.52*  CALCIUM 8.6* 8.4*  MG  --  2.0  PHOS  --  3.5    Recent Labs Lab 05/31/16 0610  PROCALCITON <0.10   UDS negativ  cxr- obese, someedema -  personally visualized   A: acute hypercarbic and hypoxemic resp failure with acute encephalopathy (obtundation) in seting of morbid obesity, Dilated systolic car  -ddx: acute pulm edma with osa possible non compliance  Very drowsy curently and might need intubation  P: continue narcan tioll can be resolved if he is took opiouids (UDS negative) Restart bipap - cotnutuous bipap Check ABG for intubation risk Aggressiv lasix  No family at bedside   .  Rest per NP/medical resident whose note is outlined above and that I agree with  The patient is critically ill with multiple organ systems failure and  requires high complexity decision making for assessment and support, frequent evaluation and titration of therapies, application of advanced monitoring technologies and extensive interpretation of multiple databases.   Critical Care Time devoted to patient care services described in this note is  30  Minutes. This time reflects time of care of this signee Dr Kalman Shan. This critical care time does not reflect procedure time, or teaching time or supervisory time of PA/NP/Med student/Med Resident etc but could involve care discussion time    Dr. Kalman Shan, M.D., [facility].C.P Pulmonary and Critical Care Medicine Staff Physician [facility] [place] Pulmonary and Critical Care Pager: [phone], If no answer or between  15:00h - 7:00h: call [phone]  05/31/2016 11:41 AM

## 2016-05-31 NOTE — Care Management Note (Signed)
Case Management Note  Patient Details  Name: JRUE JARRIEL MRN: [id number] Date of Birth: 1945-08-13  Subjective/Objective:   Pt admitted on 05/30/16 with acute encephalopathy, hypercarbic respiratory acidosis.  PTA, pt resided at home with roommate.  He is active with [facility] for [facility], PT, OT and [facility] aide, and they are aware of his hospital admission.                  Action/Plan: Will follow for discharge planning as pt progresses.  Pt will need PT/OT consults when able to tolerate therapies.    Expected Discharge Date:                  Expected Discharge Plan:  Home w Home Health Services  In-House Referral:     Discharge planning Services  CM Consult  Post Acute Care Choice:    Choice offered to:     DME Arranged:    DME Agency:     HH Arranged:    HH Agency:     Status of Service:  In process, will continue to follow  If discussed at Long Length of Stay Meetings, dates discussed:    Additional Comments:  Quintella Baton, RN, BSN  Trauma/Neuro ICU Case Manager [phone]

## 2016-06-01 DIAGNOSIS — Z79899 Other long term (current) drug therapy: Secondary | ICD-10-CM

## 2016-06-01 DIAGNOSIS — J9602 Acute respiratory failure with hypercapnia: Secondary | ICD-10-CM

## 2016-06-01 DIAGNOSIS — R400 Somnolence: Secondary | ICD-10-CM

## 2016-06-01 LAB — GLUCOSE, CAPILLARY
GLUCOSE-CAPILLARY: 138 mg/dL — AB (ref 65–99)
GLUCOSE-CAPILLARY: 200 mg/dL — AB (ref 65–99)
Glucose-Capillary: 145 mg/dL — ABNORMAL HIGH (ref 65–99)
Glucose-Capillary: 116 mg/dL — ABNORMAL HIGH (ref 65–99)
Glucose-Capillary: 170 mg/dL — ABNORMAL HIGH (ref 65–99)

## 2016-06-01 LAB — BASIC METABOLIC PANEL
Anion gap: 10 (ref 5–15)
BUN: 9 mg/dL (ref 6–20)
CHLORIDE: 103 mmol/L (ref 101–111)
CO2: 28 mmol/L (ref 22–32)
Calcium: 8.4 mg/dL — ABNORMAL LOW (ref 8.9–10.3)
Creatinine, Ser: 1.57 mg/dL — ABNORMAL HIGH (ref 0.61–1.24)
GFR calc Af Amer: 50 mL/min — ABNORMAL LOW (ref >60)
GFR calc non Af Amer: 43 mL/min — ABNORMAL LOW (ref >60)
Glucose, Bld: 107 mg/dL — ABNORMAL HIGH (ref 65–99)
POTASSIUM: 3.9 mmol/L (ref 3.5–5.1)
SODIUM: 141 mmol/L (ref 135–145)

## 2016-06-01 LAB — CBC
HEMATOCRIT: 38.3 % — AB (ref 39.0–52.0)
Hemoglobin: 11.6 g/dL — ABNORMAL LOW (ref 13.0–17.0)
MCH: 27.4 pg (ref 26.0–34.0)
MCHC: 30.3 g/dL (ref 30.0–36.0)
MCV: 90.3 fL (ref 78.0–100.0)
Platelets: 162 10*3/uL (ref 150–400)
RBC: 4.24 MIL/uL (ref 4.22–5.81)
RDW: 15.9 % — AB (ref 11.5–15.5)
WBC: 5.7 10*3/uL (ref 4.0–10.5)

## 2016-06-01 LAB — POCT I-STAT 3, ART BLOOD GAS (G3+)
Acid-Base Excess: 5 mmol/L — ABNORMAL HIGH (ref 0.0–2.0)
Acid-Base Excess: 7 mmol/L — ABNORMAL HIGH (ref 0.0–2.0)
Bicarbonate: 28.6 mmol/L — ABNORMAL HIGH (ref 20.0–28.0)
Bicarbonate: 30.1 mmol/L — ABNORMAL HIGH (ref 20.0–28.0)
O2 SAT: 86 %
O2 SAT: 97 %
PCO2 ART: 38.2 mmHg (ref 32.0–48.0)
Patient temperature: 98.1
TCO2: 30 mmol/L (ref 0–100)
TCO2: 31 mmol/L (ref 0–100)
pCO2 arterial: 38.8 mmHg (ref 32.0–48.0)
pH, Arterial: 7.475 — ABNORMAL HIGH (ref 7.350–7.450)
pH, Arterial: 7.504 — ABNORMAL HIGH (ref 7.350–7.450)
pO2, Arterial: 47 mmHg — ABNORMAL LOW (ref 83.0–108.0)
pO2, Arterial: 79 mmHg — ABNORMAL LOW (ref 83.0–108.0)

## 2016-06-01 LAB — HEPARIN LEVEL (UNFRACTIONATED): HEPARIN UNFRACTIONATED: 1.16 [IU]/mL — AB (ref 0.30–0.70)

## 2016-06-01 LAB — TROPONIN I: TROPONIN I: 0.05 ng/mL — AB (ref <0.03)

## 2016-06-01 LAB — MAGNESIUM: Magnesium: 1.7 mg/dL (ref 1.7–2.4)

## 2016-06-01 LAB — PHOSPHORUS: Phosphorus: 3.3 mg/dL (ref 2.5–4.6)

## 2016-06-01 MED ORDER — PANTOPRAZOLE SODIUM 40 MG PO TBEC
40.0000 mg | DELAYED_RELEASE_TABLET | Freq: Every day | ORAL | Status: DC
  Administered 2016-06-01 – 2016-06-11 (×11): 40 mg via ORAL
  Filled 2016-06-01 (×11): qty 1

## 2016-06-01 MED ORDER — CLOPIDOGREL BISULFATE 75 MG PO TABS
75.0000 mg | ORAL_TABLET | Freq: Every day | ORAL | Status: DC
  Administered 2016-06-01 – 2016-06-11 (×11): 75 mg via ORAL
  Filled 2016-06-01 (×11): qty 1

## 2016-06-01 MED ORDER — FUROSEMIDE 80 MG PO TABS
80.0000 mg | ORAL_TABLET | Freq: Two times a day (BID) | ORAL | Status: DC
  Administered 2016-06-01: 80 mg via ORAL
  Filled 2016-06-01 (×2): qty 1

## 2016-06-01 MED ORDER — MAGNESIUM SULFATE 2 GM/50ML IV SOLN
2.0000 g | Freq: Once | INTRAVENOUS | Status: AC
  Administered 2016-06-01: 2 g via INTRAVENOUS
  Filled 2016-06-01: qty 50

## 2016-06-01 MED ORDER — RIVAROXABAN 20 MG PO TABS
20.0000 mg | ORAL_TABLET | Freq: Every day | ORAL | Status: DC
  Administered 2016-06-01 – 2016-06-11 (×11): 20 mg via ORAL
  Filled 2016-06-01 (×11): qty 1

## 2016-06-01 MED ORDER — INDACATEROL-GLYCOPYRROLATE 27.5-15.6 MCG IN CAPS: 1.0000 | ORAL_CAPSULE | Freq: Two times a day (BID) | RESPIRATORY_TRACT | Status: DC

## 2016-06-01 NOTE — Assessment & Plan Note (Signed)
Clinically volume overlloaded chronically  Plan Lasix daily

## 2016-06-01 NOTE — Progress Notes (Signed)
RT retrieved abg sample from pt. First sample was a mixed sample. RT utilized the doppler and got an accurate sample. Pt. Alert and oriented at this time. Pt. Still refuses to wear the bipap. Tolerating [place] at this time.

## 2016-06-01 NOTE — Assessment & Plan Note (Addendum)
ssi Daughter wants insulni education at [employer]

## 2016-06-01 NOTE — Assessment & Plan Note (Signed)
Stable  Plan Dc iv heparin gtt  Resume home xarelto

## 2016-06-01 NOTE — Progress Notes (Addendum)
ANTICOAGULATION CONSULT NOTE - Follow up Consult  Pharmacy Consult for heparin Indication: atrial fibrillation  Allergies  Allergen Reactions  . Statins Other (See Comments)    Reaction:  Muscle pain   . Amlodipine Besylate Other (See Comments)    Reaction:  Muscle pain   . Cephalexin Hives    Patient Measurements: Height: 5\' 2"  (157.5 cm) Weight: 281 lb 15.5 oz (127.9 kg) IBW/kg (Calculated) : 54.6  Heparin dosing wt: 87kg  Vital Signs: Temp: 98.2 F (36.8 C) (02/24 0420) Temp Source: Oral (02/24 0420) BP: 95/62 (02/24 0600) Pulse Rate: 73 (02/24 0600)  Labs:  Recent Labs  05/30/16 1325 05/31/16 0610 05/31/16 1633 05/31/16 2036 06/01/16 0623  HGB 13.7 12.6*  --   --  11.6*  HCT 44.0 40.9  --   --  38.3*  PLT 183 163  --   --  162  APTT  --   --   --  >200*  --   LABPROT 15.8*  --   --   --   --   INR 1.25  --   --   --   --   HEPARINUNFRC  --   --   --  0.99*  --   CREATININE 1.77* 1.52* 1.57*  --   --   TROPONINI  --  0.04*  --   --   --     Estimated Creatinine Clearance: 52.7 mL/min (by C-G formula based on SCr of 1.57 mg/dL (H)).    Medications:  Infusions:  . sodium chloride    . dextrose 75 mL/hr at 06/01/16 0400  . heparin 1,450 Units/hr (06/01/16 0436)  . naLOXone [facility]) adult infusion for OVERDOSE 1 mg/hr (06/01/16 0244)    Assessment: 70 yom to start IV heparin for afib. He is prescribed xarelto but is not taking. -2/22 PT/INR = 15.8/1.25 (would anticipate INR elevation if he was compliant with xarelto)  -Heparin level was 0.99 and aPTT >200 yesterday (heparin level is likely more reliable than the aPTT), after holding heparin and reducing dose overnight, heparin level is still supratherapeutic. Patient only has peripheral lines so lab is drawing. Spoke to nurse who did not report any issues.   Goal of Therapy:  Heparin level 0.3-0.7 units/ml Monitor platelets by anticoagulation protocol: Yes   Plan:   -Hold heparin for 1 hour then  decrease infusion to 1150 units/hr -Heparin level in 6 hours and daily wth CBC daily  Sherron Monday, PharmD Clinical Pharmacy Resident Pager: [phone] 06/01/16 7:43 AM    Addendum -Transitioning pt from heparin to Xarelto -Can start Xarelto at the same time heparin infusion is discontinued -Xarelto 20 mg po daily   Baldemar Friday  06/01/2016 2:04 PM

## 2016-06-01 NOTE — Assessment & Plan Note (Addendum)
Chronically volume overloaded  Plan Daily lasix Slowly introduce other home meds when in floor

## 2016-06-01 NOTE — Progress Notes (Signed)
Pt refusing cpap at this time. PT is on nasal cannula and tolerating well. RT will leave cpap on floor in case pt changes mind during the night.

## 2016-06-01 NOTE — Progress Notes (Signed)
eLink Physician-Brief Progress Note Patient Name: Leroy [name] DOB: November 22, 1945 MRN: [id number]   Date of Service  06/01/2016  HPI/Events of Note  Transfer to telemetry if patient awake and alert off Narcan at 3 PM per Dr. Marchelle Gearing. Bedside nurse states that patient fulfills Dr. Jane Canary criteria.   eICU Interventions  Will transfer to Telemetry bed.      Intervention Category Minor Interventions: Routine modifications to care plan (e.g. PRN medications for pain, fever)  Sommer,Steven Eugene 06/01/2016, 3:20 PM

## 2016-06-01 NOTE — Assessment & Plan Note (Signed)
Baseline bilateral AKA  Plan monitor

## 2016-06-01 NOTE — Assessment & Plan Note (Signed)
Pharmacy consult .06/01/2016 to work with daughter who brought several meds in a bag

## 2016-06-01 NOTE — Progress Notes (Signed)
Pt. Is refusing to wear the bipap. RN is aware. Pt. Has been tolerating well on the [place]. RT to monitor.

## 2016-06-01 NOTE — Progress Notes (Addendum)
PULMONARY / CRITICAL CARE MEDICINE   Name: Leroy [name] MRN: [id number] DOB: 01-14-1946    ADMISSION DATE:  06/18/2016 CONSULTATION DATE:  18-Jun-2016  REFERRING MD:  Tegeler, EDP  CHIEF COMPLAINT:  Acute Encephalopathy   Brief: 71 year old male w/ extensive cardiac history including ICM w EF 10%. Found by roommate unresponsive on 06/18/22. EMS called. He was initially placed on NIPPV, abg obtained showed acute hypercarbic respiratory acidosis. His CXR showed pulmonary edema. His BP was labile. PCCM asked to admit for acute encephalopathy. On PCCM arrival pt administered narcan w/ almost immediate improvement in MS. Will admit to the ICU w/ working dx of narcotic induced encephalopathy and hypercarbia   has a past medical history of Acute respiratory failure with hypoxia and hypercapnia (HCC) (05/31/2016); Atrial flutter (HCC); Cataract; Chronic kidney disease; Chronic systolic CHF (congestive heart failure) (HCC); Coronary artery disease; Diabetes mellitus (1979); DVT (deep venous thrombosis) (HCC) (2011); H/O hiatal hernia; H/O medication noncompliance; History of blood clots; Hyperlipidemia; Hypotension; Ischemic cardiomyopathy; Joint pain; Myocardial infarction; Paroxysmal atrial fibrillation (HCC); Peripheral neuropathy (HCC); Peripheral vascular disease (HCC); Shortness of breath dyspnea; and Stroke (HCC).   has a past surgical history that includes revasculariztion (2011/2010); Tonsillectomy; Cardiac catheterization (03/19/10); Abdominal aortagram (04-30-12); Adenoidectomy; Amputation (Left, 06/03/2012); Coronary angioplasty (2002); Endarterectomy femoral (Right, 03/25/2013); Patch angioplasty (Right, 03/25/2013); Amputation (Right, 04/14/2013); I&D extremity (Right, 04/14/2013); abdominal aortagram (N/A, 04/30/2012); lower extremity angiogram (Bilateral, 04/30/2012); Eye surgery (Right); Coronary artery bypass graft (03/2000); Hernia repair; Wound debridement (Right, 04/20/2014); I&D extremity (Right,  04/20/2014); Application if wound vac (Right, 04/20/2014); Colonoscopy with propofol (N/A, 10/24/2015); Cardiac catheterization (N/A, 02/22/2016); Cardiac catheterization (N/A, 02/22/2016); and Cardiac catheterization.   STUDIES:  UDS 2022/06/18 > Neg CT Head 2022/06/18 > No acute, remote left cerebellar infarction   CULTURES: MRSA By PCR 06-18-22 > Pos Blood 18-Jun-2022 >>   ANTIBIOTICS: None.   SIGNIFICANT EVENTS: 06-18-2022 > Presents to ED AMS   LINES/TUBES: None.    EVENTS  2.23 Off Bipap on 6L [place].    SUBJECTIVE/OVERNIGHT/INTERVAL HX 06/01/16 - daughter denies he took opioids. Daughter worried about polypharmacy. Daughter wants insulin education. He is not on CPAP at home and daughter says he sleeps easily at home during day time. Curently still on narcan gtt and fully awake and per daughter at baseline  SUBJECTIVE/OVERNIGHT/INTERVAL HX  VITAL SIGNS: BP (!) 111/58   Pulse 76   Temp 97.5 F (36.4 C) (Oral)   Resp (!) 25   Ht 5\' 2"  (1.575 m)   Wt 127.9 kg (281 lb 15.5 oz)   SpO2 92%   BMI 51.57 kg/m   HEMODYNAMICS:    VENTILATOR SETTINGS:    INTAKE / OUTPUT: I/O last 3 completed shifts: In: 3401.7 [I.V.:3401.7] Out: 6425 [Urine:6425]  PHYSICAL EXAMINATION:   General Appearance:    Looks criticall ill OBESE - yes  Head:    Normocephalic, without obvious abnormality, atraumatic  Eyes:    PERRL - yes, conjunctiva/corneas - clear      Ears:    Normal external ear canals, both ears  Nose:   NG tube - no  Throat:  ETT TUBE - no , OG tube - no and mallampatti clas  Neck:   Supple,  No enlargement/tenderness/nodules     Lungs:     Clear to auscultation bilaterally but overall diminsihed  Chest wall:    No deformity  Heart:    S1 and S2 normal, no murmur, CVP - no.  Pressors - no  Abdomen:  Soft, no masses, no organomegaly  Genitalia:    Not done  Rectal:   not done  Extremities:   Extremities- bilatral amputee lowers     Skin:   Intact in exposed areas . Sacral area - not known  but there is skin excoriatin on lowers     Neurologic:   Sedation - narcan gtt -> RASS - +1 . Moves all 4s - sitting. CAM-ICU - neg for delirium . Orientation - yes        LABS: PULMONARY  Recent Labs Lab 05/30/16 1646 05/31/16 0256 05/31/16 1151 06/01/16 0257 06/01/16 0314  PHART 7.360 7.347* 7.467* 7.475* 7.504*  PCO2ART 48.9* 47.4 38.0 38.8 38.2  PO2ART 139.0* 136.0* 99.0 47.0* 79.0*  HCO3 27.6 26.1 27.6 28.6* 30.1*  TCO2 29 28 29 30 31   O2SAT 99.0 99.0 98.0 86.0 97.0    CBC  Recent Labs Lab 05/30/16 1325 05/31/16 0610 06/01/16 0623  HGB 13.7 12.6* 11.6*  HCT 44.0 40.9 38.3*  WBC 6.3 5.9 5.7  PLT 183 163 162    COAGULATION  Recent Labs Lab 05/30/16 1325  INR 1.25    CARDIAC   Recent Labs Lab 05/31/16 0610 06/01/16 0623  TROPONINI 0.04* 0.05*   No results for input(s): PROBNP in the last 168 hours.   CHEMISTRY  Recent Labs Lab 05/30/16 1325 05/31/16 0610 05/31/16 1633 06/01/16 0623  NA 141 141 143 141  K 4.3 4.3 4.5 3.9  CL 106 108 109 103  CO2 27 28 27 28   GLUCOSE 62* 62* 62* 107*  BUN 12 10 9 9   CREATININE 1.77* 1.52* 1.57* 1.57*  CALCIUM 8.6* 8.4* 8.5* 8.4*  MG  --  2.0  --  1.7  PHOS  --  3.5  --  3.3   Estimated Creatinine Clearance: 52.7 mL/min (by C-G formula based on SCr of 1.57 mg/dL (H)).   LIVER  Recent Labs Lab 05/30/16 1325  AST 49*  ALT 44  ALKPHOS 265*  BILITOT 0.5  PROT 6.4*  ALBUMIN 2.8*  INR 1.25     INFECTIOUS  Recent Labs Lab 05/30/16 1351 05/31/16 0610  LATICACIDVEN 1.57  --   PROCALCITON  --  <0.10     ENDOCRINE CBG (last 3)   Recent Labs  06/01/16 0423 06/01/16 0755 06/01/16 1128  GLUCAP 145* 116* 138*         IMAGING x48h  - image(s) personally visualized  -   highlighted in bold Ct Head Wo Contrast  Result Date: 05/30/2016 CLINICAL DATA:  Altered mental status. EXAM: CT HEAD WITHOUT CONTRAST TECHNIQUE: Contiguous axial images were obtained from the base of the skull  through the vertex without intravenous contrast. COMPARISON:  Brain MRI 05/23/2015 FINDINGS: Brain: No acute intracranial hemorrhage. No focal mass lesion. No CT evidence of acute infarction. No midline shift or mass effect. No hydrocephalus. Basilar cisterns are patent. Remote infarction LEFT cerebellum. Generalized cortical atrophy. Periventricular white matter hypodensities. Vascular: No hyperdense vessel or unexpected calcification. Skull: Normal. Negative for fracture or focal lesion. Sinuses/Orbits: Paranasal sinuses and mastoid air cells are clear. Orbits are clear. Other: Subcutaneous lesion LEFT occipital region unchanged (Image 7, series 3). IMPRESSION: 1. No acute intracranial findings.  \ 2. White matter microvascular disease and atrophy. 3. Remote LEFT cerebellar infarction. Electronically Signed   By: Genevive Bi M.D.   On: 05/30/2016 19:03   Dg Chest Port 1 View  Result Date: 05/31/2016 CLINICAL DATA:  Cough and wheezing EXAM: PORTABLE CHEST 1 VIEW COMPARISON:  May 30, 2016 FINDINGS: There is persistent cardiomegaly with pulmonary venous hypertension. Currently there is no appreciable edema or consolidation. No pleural effusions evident. No adenopathy. Patient is status post median sternotomy. No bone lesions. IMPRESSION: Pulmonary vascular congestion without frank edema or consolidation. Stable cardiac enlargement. Electronically Signed   By: Bretta Bang III M.D.   On: 05/31/2016 07:32   Dg Chest Portable 1 View  Result Date: 05/30/2016 CLINICAL DATA:  Short of breath EXAM: PORTABLE CHEST 1 VIEW COMPARISON:  03/25/2016 FINDINGS: Postop CABG. Cardiac enlargement. Vascular congestion with mild edema. Small bilateral effusions bibasilar atelectasis. IMPRESSION: Congestive heart failure with mild edema and small bilateral pleural effusions Electronically Signed   By: Marlan Palau M.D.   On: 05/30/2016 13:32   ASSESSMENT / PLAN:  ASSESSMENT and PLAN  Acute respiratory  failure with hypoxia and hypercapnia (HCC) Improved. Did not tolerate bippa. Daughter giving classic OSA symptoms  Plan - o2 for pulse ox > 88% - auto cpap qhs - duoneb as before (in [place] at home, unclear if he needs it; needs sorting out)  Altered mental status Improved with narcan gtt. Resolved. Not clear what happened because family deny opioid and UDS negative but narcan helped. 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  Plan monitor  ATRIAL FIBRILLATION, PAROXYSMAL Stable  Plan Dc iv heparin gtt  Resume home xarelto  Chronic renal disease, stage 2, mildly decreased glomerular filtration rate (GFR) between 60-89 mL/min/1.73 square meter Clinically volume overlloaded chronically  Plan Lasix daily  Chronic systolic heart failure (HCC) Chronically volume overloaded  Plan Daily lasix Slowly introduce other home meds when in floor    Diabetes mellitus type 2 with peripheral artery disease (HCC) ssi Daughter wants insulni education at [employer]  Polypharmacy Pharmacy consult .06/01/2016 to work with daughter who brought several meds in a bag      FAMILY  - Updates: 06/01/2016 --> daughter updated at bedside and patient  - Inter-disciplinary family meet or Palliative Care meeting due by:  DAy 7. Current LOS is LOS 2 days  CODE STATUS    Code Status Orders        Start     Ordered   05/30/16 1643  Full code  Continuous     05/30/16 1645    Code Status History    Date Active Date Inactive Code Status Order ID Comments User Context   03/25/2016  9:54 AM 04/02/2016  9:00 PM Full Code [id number]  Filbert Schilder, MD Inpatient   02/22/2016  8:48 AM 02/22/2016  8:48 AM Full Code [id number]  Ozella Rocks, MD ED   02/22/2016  8:48 AM 02/25/2016  6:44 PM Full Code [id number]  Ozella Rocks, MD ED   10/23/2015  3:05 AM  10/29/2015  2:12 PM Full Code [id number]  Hillary Bow, DO ED   05/22/2015  9:24 PM 05/24/2015  6:25 PM Full Code [id number]  Eduard Clos, MD Inpatient   04/20/2014  2:22 PM 04/22/2014  9:42 PM Full Code [id number]  Raymond Gurney, PA-C Inpatient   04/21/2013  6:50 PM 05/07/2013  5:50 PM Full Code [id number]  Charlton Amor, PA-C Inpatient   04/12/2013  7:53 PM 04/21/2013  6:50 PM Full Code [id number]  [name], NP Inpatient   03/25/2013  2:59 PM 03/28/2013  1:47 PM Full Code [id number]  Kara Mead  Narda Amber, PA-C Inpatient   06/03/2012  4:37 PM 06/11/2012  7:07 PM Full Code [id number]  [name], MD Inpatient        DISPO  Transfer to tele > 3pm if doing well off narcan gtt. To TRH and PCCM off d/w Dr Joseph Art of [facility]   Dr. Kalman Shan, M.D., [facility].C.P Pulmonary and Critical Care Medicine Staff Physician [facility] Pager: [phone], If no answer or between  15:00h - 7:00h: call [phone]  06/01/2016 1:10 PM

## 2016-06-02 DIAGNOSIS — Z79899 Other long term (current) drug therapy: Secondary | ICD-10-CM

## 2016-06-02 DIAGNOSIS — E119 Type 2 diabetes mellitus without complications: Secondary | ICD-10-CM

## 2016-06-02 DIAGNOSIS — I5022 Chronic systolic (congestive) heart failure: Secondary | ICD-10-CM

## 2016-06-02 DIAGNOSIS — N182 Chronic kidney disease, stage 2 (mild): Secondary | ICD-10-CM

## 2016-06-02 DIAGNOSIS — Z89512 Acquired absence of left leg below knee: Secondary | ICD-10-CM

## 2016-06-02 DIAGNOSIS — Z89611 Acquired absence of right leg above knee: Secondary | ICD-10-CM

## 2016-06-02 LAB — CBC
HCT: 38.5 % — ABNORMAL LOW (ref 39.0–52.0)
Hemoglobin: 11.9 g/dL — ABNORMAL LOW (ref 13.0–17.0)
MCH: 27.6 pg (ref 26.0–34.0)
MCHC: 30.9 g/dL (ref 30.0–36.0)
MCV: 89.3 fL (ref 78.0–100.0)
PLATELETS: 180 10*3/uL (ref 150–400)
RBC: 4.31 MIL/uL (ref 4.22–5.81)
RDW: 15.7 % — AB (ref 11.5–15.5)
WBC: 6.9 10*3/uL (ref 4.0–10.5)

## 2016-06-02 LAB — BASIC METABOLIC PANEL
Anion gap: 9 (ref 5–15)
BUN: 12 mg/dL (ref 6–20)
CALCIUM: 8.4 mg/dL — AB (ref 8.9–10.3)
CO2: 35 mmol/L — ABNORMAL HIGH (ref 22–32)
CREATININE: 1.86 mg/dL — AB (ref 0.61–1.24)
Chloride: 96 mmol/L — ABNORMAL LOW (ref 101–111)
GFR calc Af Amer: 41 mL/min — ABNORMAL LOW (ref >60)
GFR, EST NON AFRICAN AMERICAN: 35 mL/min — AB (ref >60)
GLUCOSE: 158 mg/dL — AB (ref 65–99)
Potassium: 4.6 mmol/L (ref 3.5–5.1)
SODIUM: 140 mmol/L (ref 135–145)

## 2016-06-02 LAB — GLUCOSE, CAPILLARY
GLUCOSE-CAPILLARY: 158 mg/dL — AB (ref 65–99)
GLUCOSE-CAPILLARY: 171 mg/dL — AB (ref 65–99)
GLUCOSE-CAPILLARY: 194 mg/dL — AB (ref 65–99)
GLUCOSE-CAPILLARY: 235 mg/dL — AB (ref 65–99)
Glucose-Capillary: 145 mg/dL — ABNORMAL HIGH (ref 65–99)
Glucose-Capillary: 221 mg/dL — ABNORMAL HIGH (ref 65–99)

## 2016-06-02 LAB — HEPATIC FUNCTION PANEL
ALBUMIN: 2.5 g/dL — AB (ref 3.5–5.0)
ALK PHOS: 195 U/L — AB (ref 38–126)
ALT: 29 U/L (ref 17–63)
AST: 44 U/L — ABNORMAL HIGH (ref 15–41)
BILIRUBIN INDIRECT: 0.7 mg/dL (ref 0.3–0.9)
Bilirubin, Direct: 0.5 mg/dL (ref 0.1–0.5)
TOTAL PROTEIN: 5.7 g/dL — AB (ref 6.5–8.1)
Total Bilirubin: 1.2 mg/dL (ref 0.3–1.2)

## 2016-06-02 LAB — BLOOD GAS, ARTERIAL
Acid-Base Excess: 12 mmol/L — ABNORMAL HIGH (ref 0.0–2.0)
BICARBONATE: 36.4 mmol/L — AB (ref 20.0–28.0)
Drawn by: 24486
FIO2: 21
O2 Saturation: 92.2 %
PCO2 ART: 50.6 mmHg — AB (ref 32.0–48.0)
PH ART: 7.47 — AB (ref 7.350–7.450)
PO2 ART: 66.9 mmHg — AB (ref 83.0–108.0)
Patient temperature: 98.6

## 2016-06-02 LAB — PHOSPHORUS: PHOSPHORUS: 3.4 mg/dL (ref 2.5–4.6)

## 2016-06-02 LAB — MAGNESIUM: Magnesium: 1.9 mg/dL (ref 1.7–2.4)

## 2016-06-02 MED ORDER — INSULIN ASPART 100 UNIT/ML ~~LOC~~ SOLN
0.0000 [IU] | Freq: Three times a day (TID) | SUBCUTANEOUS | Status: DC
  Administered 2016-06-03: 3 [IU] via SUBCUTANEOUS
  Administered 2016-06-03: 5 [IU] via SUBCUTANEOUS
  Administered 2016-06-04 – 2016-06-05 (×5): 3 [IU] via SUBCUTANEOUS
  Administered 2016-06-05: 8 [IU] via SUBCUTANEOUS
  Administered 2016-06-06: 11 [IU] via SUBCUTANEOUS
  Administered 2016-06-06: 3 [IU] via SUBCUTANEOUS
  Administered 2016-06-06: 2 [IU] via SUBCUTANEOUS
  Administered 2016-06-07 – 2016-06-08 (×5): 3 [IU] via SUBCUTANEOUS
  Administered 2016-06-08: 2 [IU] via SUBCUTANEOUS
  Administered 2016-06-09: 3 [IU] via SUBCUTANEOUS
  Administered 2016-06-09 – 2016-06-10 (×2): 5 [IU] via SUBCUTANEOUS
  Administered 2016-06-10: 3 [IU] via SUBCUTANEOUS
  Administered 2016-06-11: 11 [IU] via SUBCUTANEOUS
  Administered 2016-06-11: 5 [IU] via SUBCUTANEOUS

## 2016-06-02 MED ORDER — IPRATROPIUM-ALBUTEROL 0.5-2.5 (3) MG/3ML IN SOLN: 3.0000 mL | RESPIRATORY_TRACT | Status: DC | PRN

## 2016-06-02 MED ORDER — IPRATROPIUM-ALBUTEROL 0.5-2.5 (3) MG/3ML IN SOLN
3.0000 mL | Freq: Three times a day (TID) | RESPIRATORY_TRACT | Status: DC
  Administered 2016-06-02: 3 mL via RESPIRATORY_TRACT
  Filled 2016-06-02: qty 3

## 2016-06-02 MED ORDER — METOPROLOL TARTRATE 12.5 MG HALF TABLET
12.5000 mg | ORAL_TABLET | Freq: Two times a day (BID) | ORAL | Status: DC
  Administered 2016-06-02 – 2016-06-03 (×2): 12.5 mg via ORAL
  Filled 2016-06-02 (×2): qty 1

## 2016-06-02 MED ORDER — INSULIN ASPART 100 UNIT/ML ~~LOC~~ SOLN: 0.0000 [IU] | Freq: Three times a day (TID) | SUBCUTANEOUS | Status: DC

## 2016-06-02 NOTE — Progress Notes (Signed)
PULMONARY / CRITICAL CARE MEDICINE   Name: Leroy [name] MRN: [id number] DOB: Aug 24, 1945    ADMISSION DATE:  06-10-16 CONSULTATION DATE:  06-10-16  REFERRING MD:  Tegeler, EDP  CHIEF COMPLAINT:  Acute Encephalopathy   Brief: 71 year old male w/ extensive cardiac history including ICM w EF 10%. Found by roommate unresponsive on 06-10-2022. EMS called. He was initially placed on NIPPV, abg obtained showed acute hypercarbic respiratory acidosis. His CXR showed pulmonary edema. His BP was labile. PCCM asked to admit for acute encephalopathy. On PCCM arrival pt administered narcan w/ almost immediate improvement in MS. Will admit to the ICU w/ working dx of narcotic induced encephalopathy and hypercarbia    STUDIES:  UDS 06-10-22 > Neg CT Head 06-10-22 > No acute, remote left cerebellar infarction   CULTURES: MRSA By PCR 06-10-22 > Pos Blood Jun 10, 2022 >>   ANTIBIOTICS: None.   SIGNIFICANT EVENTS: 06-10-22 > Presents to ED AMS   LINES/TUBES: None.    EVENTS - declined cpap hs 2/24    SUBJECTIVE/OVERNIGHT/INTERVAL HX Comfortable at 30 degrees hob but variably described as lethargic by nursing on 1lpm Nasal 02   VITAL SIGNS: BP 133/64   Pulse 100   Temp 98 F (36.7 C) (Oral)   Resp (!) 24   Ht 5\' 9"  (1.753 m)   Wt 284 lb 1.6 oz (128.9 kg)   SpO2 98%   BMI 41.95 kg/m   HEMODYNAMICS:    VENTILATOR SETTINGS:    INTAKE / OUTPUT: I/O last 3 completed shifts: In: 3029.8 [P.O.:560; I.V.:2419.8; IV Piggyback:50] Out: 8825 [Urine:8825]  PHYSICAL EXAMINATION  Obese bm nad  Wt Readings from Last 3 Encounters:  06/02/16 284 lb 1.6 oz (128.9 kg)  04/30/16 (!) 447 lb (202.8 kg)  04/02/16 278 lb 1.6 oz (126.1 kg)    Vital signs reviewed  Pt  A bit sleepy appearing approp nad @ 30 degrees hob  No jvd Oropharynx clear Neck supple Lungs with late  bilateral scattered exp rhonchi, prominent pseudowheeze  RRR no s3 or or sign murmur Abd obese with excursion limited in supine position   Extr wam with no edema or clubbing noted Neuro  No motor deficits   LABS: PULMONARY  Recent Labs Lab 06-10-2016 1646 05/31/16 0256 05/31/16 1151 06/01/16 0257 06/01/16 0314 06/02/16 1250  PHART 7.360 7.347* 7.467* 7.475* 7.504* 7.470*  PCO2ART 48.9* 47.4 38.0 38.8 38.2 50.6*  PO2ART 139.0* 136.0* 99.0 47.0* 79.0* 66.9*  HCO3 27.6 26.1 27.6 28.6* 30.1* 36.4*  TCO2 29 28 29 30 31   --   O2SAT 99.0 99.0 98.0 86.0 97.0 92.2    CBC  Recent Labs Lab 05/31/16 0610 06/01/16 0623 06/02/16 0522  HGB 12.6* 11.6* 11.9*  HCT 40.9 38.3* 38.5*  WBC 5.9 5.7 6.9  PLT 163 162 180    COAGULATION  Recent Labs Lab June 10, 2016 1325  INR 1.25    CARDIAC    Recent Labs Lab 05/31/16 0610 06/01/16 0623  TROPONINI 0.04* 0.05*   No results for input(s): PROBNP in the last 168 hours.   CHEMISTRY  Recent Labs Lab 06-10-2016 1325 05/31/16 0610 05/31/16 1633 06/01/16 0623 06/02/16 0522  NA 141 141 143 141 140  K 4.3 4.3 4.5 3.9 4.6  CL 106 108 109 103 96*  CO2 27 28 27 28  35*  GLUCOSE 62* 62* 62* 107* 158*  BUN 12 10 9 9 12   CREATININE 1.77* 1.52* 1.57* 1.57* 1.86*  CALCIUM 8.6* 8.4* 8.5* 8.4* 8.4*  MG  --  2.0  --  1.7 1.9  PHOS  --  3.5  --  3.3 3.4   Estimated Creatinine Clearance: 49.8 mL/min (by C-G formula based on SCr of 1.86 mg/dL (H)).   LIVER  Recent Labs Lab 05/30/16 1325 06/02/16 0522  AST 49* 44*  ALT 44 29  ALKPHOS 265* 195*  BILITOT 0.5 1.2  PROT 6.4* 5.7*  ALBUMIN 2.8* 2.5*  INR 1.25  --      INFECTIOUS  Recent Labs Lab 05/30/16 1351 05/31/16 0610  LATICACIDVEN 1.57  --   PROCALCITON  --  <0.10     ENDOCRINE CBG (last 3)   Recent Labs  06/02/16 0614 06/02/16 0746 06/02/16 1215  GLUCAP 158* 145* 235*     Lab Results  Component Value Date   TSH 1.08 01/28/2014       IMAGING x48h  - image(s) personally visualized  -   highlighted in bold No results found. ASSESSMENT / PLAN:  ASSESSMENT and PLAN  Acute respiratory  failure with hypoxia and hypercapnia (HCC) Improved. Did not tolerate bipap and refused cpap     Plan - o2 for pulse ox > 88% - check TSH 2/25 >>> - duoneb as before (in [place] at home, unclear if he needs it; needs sorting out)  Altered mental status Improved with narcan gtt. Resolved. Not clear what happened because family deny opioid and UDS negative but narcan helped. 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  Plan monitor  ATRIAL FIBRILLATION, PAROXYSMAL Stable  Plan Continue  home xarelto  Chronic renal disease, stage 2, mildly decreased glomerular filtration rate (GFR) between 60-89 mL/min/1.73 square meter Clinically volume overloaded chronically  Plan Lasix daily  Chronic systolic heart failure (HCC) Chronically volume overloaded  Plan Daily lasix Slowly introduce other home meds when in floor    Diabetes mellitus type 2 with peripheral artery disease (HCC) ssi Daughter wants insulni education at [employer]  Polypharmacy Pharmacy consult .06/01/2016 to work with daughter who brought several meds in a bag   Sandrea Hughs, MD Pulmonary and Critical Care Medicine [facility] Cell [phone] After 5:30 PM or weekends, use Beeper [phone]

## 2016-06-02 NOTE — Progress Notes (Signed)
PROGRESS NOTE  Leroy [name] XFQ:[id number] DOB: May 04, 1945 DOA: 05/30/2016 PCP: [name], MD  HPI/Recap of past 24 hours:  Drowsy, with slight confusion, report he is at [facility] long and it is 2017 Report breathing is not at baseline yet No fever, does has intermittent congested cough,   Assessment/Plan: Active Problems:   ATRIAL FIBRILLATION, PAROXYSMAL   Chronic systolic heart failure (HCC)   Atherosclerosis of native arteries of the extremities with ulceration (HCC)   Diabetes mellitus type 2 with peripheral artery disease (HCC)   Chronic renal disease, stage 2, mildly decreased glomerular filtration rate (GFR) between 60-89 mL/min/1.73 square meter   Altered mental status   Acute respiratory failure with hypoxia and hypercapnia (HCC)   Polypharmacy  Acute encephalopathy:  He was found by room mate unresponsice on 2/22, he was brought to the hospital by EMS, abg showed acute hypercarbic respiratory acidosis, he was admitted to icu , put on bipap Unclear etiology Per pccm "On PCCM arrival pt administered narcan w/ almost immediate improvement in MS. " "Favor narcotic induced hypercarbia as seems to be responding to narcan" but urine tox negative for opioids.  Continue bipap qhs   Acute respiratory failure with hypoxia and hypercapnia  No previous diagnosis of copd but is a former smoker Body mass index is 41.95 kg/m. Nebs, bipap  Acute on Chronic systolic heart failure  Better on lasix  Paroxysmal afib: on bb and xarelto  CAD s/p CABG on plavix, xarelto, h/o statin allergy Denies chest pain  Insulin dependent dm2, a1c pending, continue insulin  CKDIII, monitor cr, renal dosing meds  S/p left kba and right aka  Polypharmacy Pharmacy consult .06/01/2016 to work with daughter who brought several meds in a bag  Code Status: full  Family Communication: patient   Disposition Plan: pending   Consultants:  icu admit on 2/22  TRH pick up on  2/25  Procedures:  bipap  Antibiotics:  none   Objective: BP 133/64   Pulse 100   Temp 98 F (36.7 C) (Oral)   Resp (!) 24   Ht 5\' 9"  (1.753 m)   Wt 128.9 kg (284 lb 1.6 oz)   SpO2 98%   BMI 41.95 kg/m   Intake/Output Summary (Last 24 hours) at 06/02/16 1800 Last data filed at 06/02/16 1349  Gross per 24 hour  Intake              600 ml  Output             5925 ml  Net            -5325 ml   Filed Weights   06/01/16 0438 06/01/16 1740 06/02/16 0548  Weight: 127.9 kg (281 lb 15.5 oz) 128.6 kg (283 lb 9.6 oz) 128.9 kg (284 lb 1.6 oz)    Exam:   General:  Drowsy, oriented to person only, follow commands  Cardiovascular: RRR  Respiratory: diminished , no wheezing, no rales, no rhonchi  Abdomen: Soft/ND/NT, positive BS  Musculoskeletal: left bka, right aka  Neuro:  Drowsy, oriented to person only, follow commands  Data Reviewed: Basic Metabolic Panel:  Recent Labs Lab 05/30/16 1325 05/31/16 0610 05/31/16 1633 06/01/16 0623 06/02/16 0522  NA 141 141 143 141 140  K 4.3 4.3 4.5 3.9 4.6  CL 106 108 109 103 96*  CO2 27 28 27 28  35*  GLUCOSE 62* 62* 62* 107* 158*  BUN 12 10 9 9 12   CREATININE 1.77* 1.52* 1.57* 1.57* 1.86*  CALCIUM 8.6* 8.4* 8.5* 8.4* 8.4*  MG  --  2.0  --  1.7 1.9  PHOS  --  3.5  --  3.3 3.4   Liver Function Tests:  Recent Labs Lab 05/30/16 1325 06/02/16 0522  AST 49* 44*  ALT 44 29  ALKPHOS 265* 195*  BILITOT 0.5 1.2  PROT 6.4* 5.7*  ALBUMIN 2.8* 2.5*   No results for input(s): LIPASE, AMYLASE in the last 168 hours. No results for input(s): AMMONIA in the last 168 hours. CBC:  Recent Labs Lab 05/30/16 1325 05/31/16 0610 06/01/16 0623 06/02/16 0522  WBC 6.3 5.9 5.7 6.9  NEUTROABS 3.2  --   --   --   HGB 13.7 12.6* 11.6* 11.9*  HCT 44.0 40.9 38.3* 38.5*  MCV 90.9 90.1 90.3 89.3  PLT 183 163 162 180   Cardiac Enzymes:    Recent Labs Lab 05/31/16 0610 06/01/16 0623  TROPONINI 0.04* 0.05*   BNP (last 3  results)  Recent Labs  02/22/16 0403 03/25/16 0605 05/30/16 1325  BNP 157.6* 1,098.2* 1,357.3*    ProBNP (last 3 results) No results for input(s): PROBNP in the last 8760 hours.  CBG:  Recent Labs Lab 06/02/16 0057 06/02/16 0614 06/02/16 0746 06/02/16 1215 06/02/16 1627  GLUCAP 171* 158* 145* 235* 221*    Recent Results (from the past 240 hour(s))  Culture, blood (Routine x 2)     Status: None (Preliminary result)   Collection Time: 05/30/16  1:05 PM  Result Value Ref Range Status   Specimen Description BLOOD RIGHT ANTECUBITAL  Final   Special Requests IN PEDIATRIC BOTTLE  4CC  Final   Culture NO GROWTH 3 DAYS  Final   Report Status PENDING  Incomplete  MRSA PCR Screening     Status: Abnormal   Collection Time: 05/30/16  6:59 PM  Result Value Ref Range Status   MRSA by PCR POSITIVE (A) NEGATIVE Final    Comment:        The GeneXpert MRSA Assay (FDA approved for NASAL specimens only), is one component of a comprehensive MRSA colonization surveillance program. It is not intended to diagnose MRSA infection nor to guide or monitor treatment for MRSA infections. RESULT CALLED TO, READ BACK BY AND VERIFIED WITH: T.LILLEY,RN AT 2100 BY L.PITT 05/30/16   Culture, blood (Routine x 2)     Status: None (Preliminary result)   Collection Time: 05/30/16  7:29 PM  Result Value Ref Range Status   Specimen Description BLOOD LEFT HAND  Final   Special Requests IN PEDIATRIC BOTTLE 1CC  Final   Culture NO GROWTH 3 DAYS  Final   Report Status PENDING  Incomplete     Studies: No results found.  Scheduled Meds: . Chlorhexidine Gluconate Cloth  6 each Topical Q0600  . clopidogrel  75 mg Oral Daily  . furosemide  80 mg Intravenous Q8H  . insulin aspart  0-15 Units Subcutaneous TID WC  . ipratropium-albuterol  3 mL Nebulization Q8H  . metoprolol tartrate  12.5 mg Oral BID  . mupirocin ointment  1 application Nasal BID  . pantoprazole  40 mg Oral Q1200  . rivaroxaban  20 mg  Oral Q supper    Continuous Infusions:   Time spent:  Catlyn Shipton MD, PhD  [facility] Pager [phone]. If 7PM-7AM, please contact night-coverage at [email or web address], password [facility] 06/02/2016, 6:00 PM  LOS: 3 days

## 2016-06-02 NOTE — Progress Notes (Signed)
Patient lethargic but arousable to verbal stimuli. MD, Charge Nurse, RT and RRT notified. Charge nurse and RT at bedside at this time. ABG gas ordered. O2 99% room air.  Patient refused CPAP last night. RT to initiate CPAP.  Will continue to monitor frequently.

## 2016-06-02 NOTE — Progress Notes (Signed)
Patient refuses CPAP therapy for tonight. 

## 2016-06-02 NOTE — Progress Notes (Signed)
Patient alert and oriented and wide awake eating lunch..Patient O2 at 99%. CPAP at bedside Awaiting for ABG results.Will continue to monitor frequently.

## 2016-06-02 NOTE — Progress Notes (Signed)
RT note: RT called to patient room stat for a CPAP an ABG. RT obtained ABG. Patient oriented to time and place. RT set up CPAP. Patient awake at this time RN requested that patient not be placed  on CPAP at this time.

## 2016-06-03 ENCOUNTER — Other Ambulatory Visit: Payer: Self-pay | Admitting: Internal Medicine

## 2016-06-03 ENCOUNTER — Inpatient Hospital Stay (HOSPITAL_COMMUNITY): Payer: Medicare HMO

## 2016-06-03 DIAGNOSIS — Z794 Long term (current) use of insulin: Secondary | ICD-10-CM

## 2016-06-03 LAB — GLUCOSE, CAPILLARY
GLUCOSE-CAPILLARY: 241 mg/dL — AB (ref 65–99)
Glucose-Capillary: 169 mg/dL — ABNORMAL HIGH (ref 65–99)
Glucose-Capillary: 165 mg/dL — ABNORMAL HIGH (ref 65–99)
Glucose-Capillary: 164 mg/dL — ABNORMAL HIGH (ref 65–99)

## 2016-06-03 LAB — CBC
HEMATOCRIT: 42.2 % (ref 39.0–52.0)
Hemoglobin: 13 g/dL (ref 13.0–17.0)
MCH: 27.7 pg (ref 26.0–34.0)
MCHC: 30.8 g/dL (ref 30.0–36.0)
MCV: 89.8 fL (ref 78.0–100.0)
Platelets: 178 10*3/uL (ref 150–400)
RBC: 4.7 MIL/uL (ref 4.22–5.81)
RDW: 16 % — AB (ref 11.5–15.5)
WBC: 6.1 10*3/uL (ref 4.0–10.5)

## 2016-06-03 LAB — BASIC METABOLIC PANEL
ANION GAP: 10 (ref 5–15)
BUN: 11 mg/dL (ref 6–20)
CO2: 36 mmol/L — ABNORMAL HIGH (ref 22–32)
Calcium: 8.5 mg/dL — ABNORMAL LOW (ref 8.9–10.3)
Chloride: 94 mmol/L — ABNORMAL LOW (ref 101–111)
Creatinine, Ser: 1.63 mg/dL — ABNORMAL HIGH (ref 0.61–1.24)
GFR calc Af Amer: 48 mL/min — ABNORMAL LOW (ref >60)
GFR, EST NON AFRICAN AMERICAN: 41 mL/min — AB (ref >60)
Glucose, Bld: 200 mg/dL — ABNORMAL HIGH (ref 65–99)
POTASSIUM: 3.3 mmol/L — AB (ref 3.5–5.1)
SODIUM: 140 mmol/L (ref 135–145)

## 2016-06-03 LAB — PHOSPHORUS: PHOSPHORUS: 3.1 mg/dL (ref 2.5–4.6)

## 2016-06-03 LAB — TSH: TSH: 1.582 u[IU]/mL (ref 0.350–4.500)

## 2016-06-03 LAB — MAGNESIUM: Magnesium: 1.7 mg/dL (ref 1.7–2.4)

## 2016-06-03 MED ORDER — CARVEDILOL 3.125 MG PO TABS
3.1250 mg | ORAL_TABLET | Freq: Two times a day (BID) | ORAL | Status: DC
  Administered 2016-06-03 – 2016-06-11 (×15): 3.125 mg via ORAL
  Filled 2016-06-03 (×18): qty 1

## 2016-06-03 MED ORDER — FUROSEMIDE 10 MG/ML IJ SOLN
80.0000 mg | Freq: Two times a day (BID) | INTRAMUSCULAR | Status: DC
  Administered 2016-06-04 – 2016-06-05 (×3): 80 mg via INTRAVENOUS
  Filled 2016-06-03 (×3): qty 8

## 2016-06-03 MED ORDER — POTASSIUM CHLORIDE CRYS ER 20 MEQ PO TBCR
40.0000 meq | EXTENDED_RELEASE_TABLET | Freq: Once | ORAL | Status: AC
  Administered 2016-06-03: 40 meq via ORAL
  Filled 2016-06-03: qty 2

## 2016-06-03 NOTE — Discharge Instructions (Signed)

## 2016-06-03 NOTE — Progress Notes (Signed)
PROGRESS NOTE  Leroy [name] YNW:[id number] DOB: October 08, 1945 DOA: 05/30/2016 PCP: [name], MD  HPI/Recap of past 24 hours:  Drowsy, less confused, today he is oriented x3, this is an improvement compare to yesterday He is off oxygen at rest, denies sob, no cough, no chest pain, report bilateral stump/legs are less swollen No fever,   Assessment/Plan: Active Problems:   ATRIAL FIBRILLATION, PAROXYSMAL   Chronic systolic heart failure (HCC)   Atherosclerosis of native arteries of the extremities with ulceration (HCC)   Diabetes mellitus type 2 with peripheral artery disease (HCC)   Chronic renal disease, stage 2, mildly decreased glomerular filtration rate (GFR) between 60-89 mL/min/1.73 square meter   Altered mental status   Acute respiratory failure with hypoxia and hypercapnia (HCC)   Polypharmacy  Acute encephalopathy? unresponsiveness:  He was found by room mate unresponsiveness on 2/22, he was brought to the hospital by EMS, abg showed acute hypercarbic respiratory acidosis, he was admitted to icu , put on bipap CT head did show remote infarct, but no acute findings Per pccm "On PCCM arrival pt administered narcan w/ almost immediate improvement in MS. " "Favor narcotic induced hypercarbia as seems to be responding to narcan" but urine tox negative for opioids.  Continue bipap qhs I have talked to patient 's main care giver ms Ellery Plunk who report patient has been drowsy and with slight confusion for the last months or two, I recommend outpatient sleep study. I have review patient's chart, he also has advanced chf with very low EF, so arrhythmia could also contribute to the unresponsiveness on 2/22, he has been in sinus rhythm while in the hospital this time  will call cardiology in am.   Acute respiratory failure with hypoxia and hypercapnia  No previous diagnosis of copd but is a former smoker Body mass index is 37.24 kg/m. Nebs, bipap  Acute on Chronic  systolic heart failure  Most recent EF 10-15% in 02/2016, per outpatient cardiology note, plan to repeat echo in three months and consider ICD placement if ef remain lowe Will repeat echocardiogram, call cardiology in am He was treated with iv lasix 80mg  TID, good urine output, no off o2 at rest, leg/stumps less edematous, weight down significantly, cr stable, change iv lasix to 80mg  bid on 2/26 Change lopressor to low dose coreg on 2/26, low bp and elevated cr prohibit acei /arbs for now  Paroxysmal afib:  Sinus rhythm this hospitalization change lopressor to coreg continue xarelto  CAD s/p CABG on plavix, xarelto, h/o statin allergy , but crestor listed on home med list Denies chest pain, chronic mild elevated troponin , at baseline  Insulin dependent dm2, a1c pending, continue insulin  CKDIII, monitor cr, renal dosing meds  S/p left kba and right aka, family report patient has not been able to wear his prosthesis due to stump edema  Code Status: full  Family Communication: patient and daughter [name] over the phone with patient's permission  Disposition Plan: likely will need SNF, daughter [name] agrees   Consultants:  icu admit on 2/22  TRH pick up on 2/25  cardiology  Procedures:  bipap  Antibiotics:  none   Objective: BP (!) 95/57 (BP Location: Left Arm)   Pulse 95   Temp 98.4 F (36.9 C) (Oral)   Resp 18   Ht 5\' 9"  (1.753 m)   Wt 114.4 kg (252 lb 3.2 oz) Comment: rn notifed of weight difference  SpO2 99%   BMI 37.24 kg/m  Intake/Output Summary (Last 24 hours) at 06/03/16 1024 Last data filed at 06/03/16 0955  Gross per 24 hour  Intake             1060 ml  Output             8925 ml  Net            -7865 ml   Filed Weights   06/01/16 1740 06/02/16 0548 06/03/16 0438  Weight: 128.6 kg (283 lb 9.6 oz) 128.9 kg (284 lb 1.6 oz) 114.4 kg (252 lb 3.2 oz)    Exam:   General:  Drowsy, but better oriented today  Cardiovascular:  RRR  Respiratory: diminished , no wheezing, no rales, no rhonchi  Abdomen: Soft/ND/NT, positive BS  Musculoskeletal: left bka, right aka, stump edema has much improved per patient  Neuro:  Drowsy, oriented to person only, follow commands  Data Reviewed: Basic Metabolic Panel:  Recent Labs Lab 05/31/16 0610 05/31/16 1633 06/01/16 0623 06/02/16 0522 06/03/16 0453  NA 141 143 141 140 140  K 4.3 4.5 3.9 4.6 3.3*  CL 108 109 103 96* 94*  CO2 28 27 28  35* 36*  GLUCOSE 62* 62* 107* 158* 200*  BUN 10 9 9 12 11   CREATININE 1.52* 1.57* 1.57* 1.86* 1.63*  CALCIUM 8.4* 8.5* 8.4* 8.4* 8.5*  MG 2.0  --  1.7 1.9 1.7  PHOS 3.5  --  3.3 3.4 3.1   Liver Function Tests:  Recent Labs Lab 05/30/16 1325 06/02/16 0522  AST 49* 44*  ALT 44 29  ALKPHOS 265* 195*  BILITOT 0.5 1.2  PROT 6.4* 5.7*  ALBUMIN 2.8* 2.5*   No results for input(s): LIPASE, AMYLASE in the last 168 hours. No results for input(s): AMMONIA in the last 168 hours. CBC:  Recent Labs Lab 05/30/16 1325 05/31/16 0610 06/01/16 0623 06/02/16 0522 06/03/16 0453  WBC 6.3 5.9 5.7 6.9 6.1  NEUTROABS 3.2  --   --   --   --   HGB 13.7 12.6* 11.6* 11.9* 13.0  HCT 44.0 40.9 38.3* 38.5* 42.2  MCV 90.9 90.1 90.3 89.3 89.8  PLT 183 163 162 180 178   Cardiac Enzymes:    Recent Labs Lab 05/31/16 0610 06/01/16 0623  TROPONINI 0.04* 0.05*   BNP (last 3 results)  Recent Labs  02/22/16 0403 03/25/16 0605 05/30/16 1325  BNP 157.6* 1,098.2* 1,357.3*    ProBNP (last 3 results) No results for input(s): PROBNP in the last 8760 hours.  CBG:  Recent Labs Lab 06/02/16 0746 06/02/16 1215 06/02/16 1627 06/02/16 2138 06/03/16 0727  GLUCAP 145* 235* 221* 194* 169*    Recent Results (from the past 240 hour(s))  Culture, blood (Routine x 2)     Status: None (Preliminary result)   Collection Time: 05/30/16  1:05 PM  Result Value Ref Range Status   Specimen Description BLOOD RIGHT ANTECUBITAL  Final   Special  Requests IN PEDIATRIC BOTTLE  4CC  Final   Culture NO GROWTH 3 DAYS  Final   Report Status PENDING  Incomplete  MRSA PCR Screening     Status: Abnormal   Collection Time: 05/30/16  6:59 PM  Result Value Ref Range Status   MRSA by PCR POSITIVE (A) NEGATIVE Final    Comment:        The GeneXpert MRSA Assay (FDA approved for NASAL specimens only), is one component of a comprehensive MRSA colonization surveillance program. It is not intended to diagnose MRSA infection nor to  guide or monitor treatment for MRSA infections. RESULT CALLED TO, READ BACK BY AND VERIFIED WITH: T.LILLEY,RN AT 2100 BY L.PITT 05/30/16   Culture, blood (Routine x 2)     Status: None (Preliminary result)   Collection Time: 05/30/16  7:29 PM  Result Value Ref Range Status   Specimen Description BLOOD LEFT HAND  Final   Special Requests IN PEDIATRIC BOTTLE 1CC  Final   Culture NO GROWTH 3 DAYS  Final   Report Status PENDING  Incomplete     Studies: No results found.  Scheduled Meds: . Chlorhexidine Gluconate Cloth  6 each Topical Q0600  . clopidogrel  75 mg Oral Daily  . furosemide  80 mg Intravenous Q8H  . insulin aspart  0-15 Units Subcutaneous TID WC  . metoprolol tartrate  12.5 mg Oral BID  . mupirocin ointment  1 application Nasal BID  . pantoprazole  40 mg Oral Q1200  . potassium chloride  40 mEq Oral Once  . rivaroxaban  20 mg Oral Q supper    Continuous Infusions:   Time spent:  Abbie Berling MD, PhD  [facility] Pager [phone]. If 7PM-7AM, please contact night-coverage at [email or web address], password [email or web address] 06/03/2016, 10:24 AM  LOS: 4 days

## 2016-06-03 NOTE — Progress Notes (Signed)
Inpatient Diabetes Program Recommendations  AACE/ADA: New Consensus Statement on Inpatient Glycemic Control (2015)  Target Ranges:  Prepandial:   less than 140 mg/dL      Peak postprandial:   less than 180 mg/dL (1-2 hours)      Critically ill patients:  140 - 180 mg/dL   Lab Results  Component Value Date   GLUCAP 169 (H) 06/03/2016   HGBA1C 10.6 (H) 02/22/2016    Review of Glycemic Control  Results for LOXLEY, MACLENNAN (MRN [id number]) as of 06/03/2016 07:50  Ref. Range 06/02/2016 12:15 06/02/2016 16:27 06/02/2016 21:38 06/03/2016 07:27  Glucose-Capillary Latest Ref Range: 65 - 99 mg/dL 423 (H) 953 (H) 202 (H) 169 (H)   Post-prandial blood sugar high.  Inpatient Diabetes Program Recommendations:     Consider addition of Novolog 3 units tidwc.  Thank you. Ailene Ards, RD, LDN, CDE Inpatient Diabetes Coordinator [phone]

## 2016-06-04 ENCOUNTER — Inpatient Hospital Stay (HOSPITAL_COMMUNITY): Payer: Medicare HMO

## 2016-06-04 DIAGNOSIS — I482 Chronic atrial fibrillation: Secondary | ICD-10-CM

## 2016-06-04 DIAGNOSIS — I25810 Atherosclerosis of coronary artery bypass graft(s) without angina pectoris: Secondary | ICD-10-CM

## 2016-06-04 DIAGNOSIS — I5023 Acute on chronic systolic (congestive) heart failure: Secondary | ICD-10-CM

## 2016-06-04 DIAGNOSIS — I255 Ischemic cardiomyopathy: Secondary | ICD-10-CM

## 2016-06-04 LAB — CULTURE, BLOOD (ROUTINE X 2)
CULTURE: NO GROWTH
Culture: NO GROWTH

## 2016-06-04 LAB — HEPATIC FUNCTION PANEL
ALT: 29 U/L (ref 17–63)
AST: 32 U/L (ref 15–41)
Albumin: 2.7 g/dL — ABNORMAL LOW (ref 3.5–5.0)
Alkaline Phosphatase: 180 U/L — ABNORMAL HIGH (ref 38–126)
BILIRUBIN DIRECT: 0.3 mg/dL (ref 0.1–0.5)
BILIRUBIN INDIRECT: 1.2 mg/dL — AB (ref 0.3–0.9)
Total Bilirubin: 1.5 mg/dL — ABNORMAL HIGH (ref 0.3–1.2)
Total Protein: 6.5 g/dL (ref 6.5–8.1)

## 2016-06-04 LAB — HEMOGLOBIN A1C
HEMOGLOBIN A1C: 8 % — AB (ref 4.8–5.6)
MEAN PLASMA GLUCOSE: 183 mg/dL

## 2016-06-04 LAB — GLUCOSE, CAPILLARY
GLUCOSE-CAPILLARY: 160 mg/dL — AB (ref 65–99)
GLUCOSE-CAPILLARY: 195 mg/dL — AB (ref 65–99)
Glucose-Capillary: 174 mg/dL — ABNORMAL HIGH (ref 65–99)
Glucose-Capillary: 196 mg/dL — ABNORMAL HIGH (ref 65–99)

## 2016-06-04 LAB — BASIC METABOLIC PANEL
Anion gap: 11 (ref 5–15)
BUN: 11 mg/dL (ref 6–20)
CALCIUM: 8.5 mg/dL — AB (ref 8.9–10.3)
CO2: 32 mmol/L (ref 22–32)
CREATININE: 1.56 mg/dL — AB (ref 0.61–1.24)
Chloride: 94 mmol/L — ABNORMAL LOW (ref 101–111)
GFR calc non Af Amer: 44 mL/min — ABNORMAL LOW (ref >60)
GFR, EST AFRICAN AMERICAN: 51 mL/min — AB (ref >60)
Glucose, Bld: 156 mg/dL — ABNORMAL HIGH (ref 65–99)
Potassium: 3.9 mmol/L (ref 3.5–5.1)
Sodium: 137 mmol/L (ref 135–145)

## 2016-06-04 LAB — CBC
HCT: 42.7 % (ref 39.0–52.0)
HEMOGLOBIN: 13.5 g/dL (ref 13.0–17.0)
MCH: 28 pg (ref 26.0–34.0)
MCHC: 31.6 g/dL (ref 30.0–36.0)
MCV: 88.6 fL (ref 78.0–100.0)
Platelets: 194 10*3/uL (ref 150–400)
RBC: 4.82 MIL/uL (ref 4.22–5.81)
RDW: 15.7 % — AB (ref 11.5–15.5)
WBC: 6.8 10*3/uL (ref 4.0–10.5)

## 2016-06-04 LAB — HIV ANTIBODY (ROUTINE TESTING W REFLEX): HIV SCREEN 4TH GENERATION: NONREACTIVE

## 2016-06-04 LAB — RPR: RPR: NONREACTIVE

## 2016-06-04 MED ORDER — ISOSORBIDE MONONITRATE ER 30 MG PO TB24
15.0000 mg | ORAL_TABLET | Freq: Every day | ORAL | Status: DC
  Administered 2016-06-05 – 2016-06-07 (×3): 15 mg via ORAL
  Filled 2016-06-04 (×3): qty 1

## 2016-06-04 MED ORDER — HYDRALAZINE HCL 25 MG PO TABS
12.5000 mg | ORAL_TABLET | Freq: Three times a day (TID) | ORAL | Status: DC
  Administered 2016-06-04 – 2016-06-05 (×2): 12.5 mg via ORAL
  Filled 2016-06-04 (×5): qty 1

## 2016-06-04 NOTE — Consult Note (Signed)
Cardiology Consult    Patient ID: Leroy [name] MRN: [id number], DOB/AGE: 1945-11-26   Admit date: 05/30/2016 Date of Consult: 06/04/2016  Primary Physician: [name], MD Reason for Consult: CHF and syncope Primary Cardiologist: Dr. Percival Spanish Requesting Provider: Dr. Erlinda Hong   History of Present Illness    Leroy [name] is a 71 y.o. male with past medical history significant for CKD, atrail flutter previously anticoagualted with Xarelto but not taking due to cost, DM, bilateral BKA, ischemic cardiomyopathy with EF 10-15% per echo in 02/2016, CAD s/p CABG in 2001 in [place], NSTEMI 02/2016 who was found unresponsive by roommate. ABG showed acute hypercarbic respiratory acidosis. CXR showed pulmonary edema. He was admitted with acute encephalopathy. He was given Narcan with immediate response although his UDS was negative for narcotics.   He had a NSTEMI in 02/2016 and a cath showed occluded LIMA graft to LAD, the native LAD was also occluded at the ostium. The native LAD was opened with balloon angioplasty. Patent vein graft to several OM branches. The native RCA is occluded and the vein graft to the RCA is occluded (known from last cath in 2011). At that time his EF was found to be decreased to 10-15%, from EF 25-30% in 05/2015.  He has been aggressively diuresed with lasix 80 mg IV TID and was on BiPap therapy for a time. His weight is down over 40 pounds and he has diuresed over 20 liters. The patient's mental status has improved and he is aware of his circumstances.   At present he denies that he takes a diuretic at home. He also is unaware that he had so much edema although he was unable to wear his prostheses due to leg edema. At his last office visit on 03/22/2016 his weight was 277 lbs. On admission his 05/30/2016 his weight was 288 and is down to 241 today.     Past Medical History   Past Medical History:  Diagnosis Date  . Acute respiratory failure with hypoxia and  hypercapnia ([place]) 05/31/2016  . Atrial flutter ([place])   . Cataract   . Chronic kidney disease    on Lisinopril to protect kidneys d/t being on Metformin per pt  . Chronic systolic CHF (congestive heart failure) ([place])   . Coronary artery disease    a. s/p remote CABG 2001 at [facility] b. Cath 03/2010: for med rx.;  c.  Carlton Adam myoview (1/14):  Large Inferior apical MI, very small area of peri-infarct ischemia toward the inferior apical segment. EF 19%.  Med Rx was continued.    . Diabetes mellitus 1979   takes Metformni daily  and Lantus 50units bid  . DVT (deep venous thrombosis) ([place]) 2011   legs  . H/O hiatal hernia   . H/O medication noncompliance    Due to insurance issues  . History of blood clots    in legs--this was in 2011--has been off of Coumadin 56month;takes Xarelto daily  . Hyperlipidemia   . Hypotension   . Ischemic cardiomyopathy    a. EF 40% 2010. b. Echo (2/14):  mild LVH, EF 30-35%, diff HK, Gr 1 DD, MAC, mild MR, mod LAE, PASP 39  . Joint pain   . Myocardial infarction    total of 8 heart attacks;last one in 2011  . Paroxysmal atrial fibrillation (H[facility])   . Peripheral neuropathy ([place]   . Peripheral vascular disease ([place]    a. s/p L BKA 2014.  .Marland KitchenShortness of breath dyspnea   .  Stroke [facility])     Past Surgical History:  Procedure Laterality Date  . ABDOMINAL AORTAGRAM  04-30-12  . ABDOMINAL AORTAGRAM N/A 04/30/2012   Procedure: ABDOMINAL Maxcine Ham;  Surgeon: Conrad [place], MD;  Location: [facility] CATH LAB;  Service: Cardiovascular;  Laterality: N/A;  . ADENOIDECTOMY    . AMPUTATION Left 06/03/2012   Procedure: AMPUTATION BELOW KNEE;  Surgeon: Conrad Dike, MD;  Location: [place];  Service: Vascular;  Laterality: Left;  . AMPUTATION Right 04/14/2013   Procedure: AMPUTATION BELOW KNEE ;  Surgeon: Conrad Leake, MD;  Location: [place];  Service: Vascular;  Laterality: Right;  . APPLICATION OF WOUND VAC Right 04/20/2014   Procedure: APPLICATION OF WOUND VAC;  Surgeon: Conrad [place], MD;   Location: [place];  Service: Vascular;  Laterality: Right;  . CARDIAC CATHETERIZATION  03/19/10  . CARDIAC CATHETERIZATION N/A 02/22/2016   Procedure: Left Heart Cath and Cors/Grafts Angiography;  Surgeon: Jettie Booze, MD;  Location: [place] CV LAB;  Service: Cardiovascular;  Laterality: N/A;  . CARDIAC CATHETERIZATION N/A 02/22/2016   Procedure: Coronary Stent Intervention;  Surgeon: Jettie Booze, MD;  Location: [place] CV LAB;  Service: Cardiovascular;  Laterality: N/A;  . CARDIAC CATHETERIZATION    . COLONOSCOPY WITH PROPOFOL N/A 10/24/2015   Procedure: COLONOSCOPY WITH PROPOFOL;  Surgeon: Milus Banister, MD;  Location: [facility] ENDOSCOPY;  Service: Endoscopy;  Laterality: N/A;  . CORONARY ANGIOPLASTY  2002   3 stents  . CORONARY ARTERY BYPASS GRAFT  03/2000   quadruple  . ENDARTERECTOMY FEMORAL Right 03/25/2013   Procedure: ENDARTERECTOMY FEMORAL ;  Surgeon: Conrad [place]ee, MD;  Location: [place];  Service: Vascular;  Laterality: Right;  . EYE SURGERY Right    Catarct  . HERNIA REPAIR     Hiatal Hernia  . I&D EXTREMITY Right 04/14/2013   Procedure: IRRIGATION AND DEBRIDEMENT OF RIGHT  GROIN & PLACEMENT OF VAC DRESSING;  Surgeon: Conrad [place], MD;  Location: [place];  Service: Vascular;  Laterality: Right;  . I&D EXTREMITY Right 04/20/2014   Procedure: DEBRIDEMENT OF RIGHT BELOW KNEE AMPUTATION STUMP;  Surgeon: Conrad [place], MD;  Location: [place];  Service: Vascular;  Laterality: Right;  . LOWER EXTREMITY ANGIOGRAM Bilateral 04/30/2012   Procedure: LOWER EXTREMITY ANGIOGRAM;  Surgeon: Conrad , MD;  Location: [facility] CATH LAB;  Service: Cardiovascular;  Laterality: Bilateral;  bilat lower extrem angio  . PATCH ANGIOPLASTY Right 03/25/2013   Procedure: PATCH ANGIOPLASTY USING VASCU-GUARD PERIPHERAL VASCULAR PATCH;  Surgeon: Conrad , MD;  Location: [place];  Service: Vascular;  Laterality: Right;  . revasculariztion  2011/2010   x 2   . TONSILLECTOMY    . WOUND DEBRIDEMENT Right  04/20/2014   s/p  rt bka  with application of wound vac     Allergies  Allergies  Allergen Reactions  . Statins Other (See Comments)    Reaction:  Muscle pain   . Amlodipine Besylate Other (See Comments)    Reaction:  Muscle pain   . Cephalexin Hives    Inpatient Medications    . carvedilol  3.125 mg Oral BID WC  . Chlorhexidine Gluconate Cloth  6 each Topical Q0600  . clopidogrel  75 mg Oral Daily  . furosemide  80 mg Intravenous BID  . insulin aspart  0-15 Units Subcutaneous TID WC  . pantoprazole  40 mg Oral Q1200  . rivaroxaban  20 mg Oral Q supper    Family History  Family History  Problem Relation Age of Onset  . Heart disease Mother     before age 95  . Diabetes Mother   . Heart attack Mother   . Stroke Mother   . Heart disease Father   . Colon cancer Neg Hx   . Stomach cancer Neg Hx   . Pancreatic cancer Neg Hx   . Esophageal cancer Neg Hx     Social History    Social History   Social History  . Marital status: Legally Separated    Spouse name: N/A  . Number of children: N/A  . Years of education: N/A   Occupational History  . Not on file.   Social History Main Topics  . Smoking status: Former Smoker    Types: Cigarettes  . Smokeless tobacco: Never Used     Comment: quit smoking 50yr ago  . Alcohol use No  . Drug use: No  . Sexual activity: Not Currently   Other Topics Concern  . Not on file   Social History Narrative  . No narrative on file     Review of Systems   General:  No chills, fever, night sweats or weight changes.  Cardiovascular:  No chest pain, dyspnea on exertion, edema, orthopnea, palpitations, paroxysmal nocturnal dyspnea. Dermatological: No rash, lesions/masses Respiratory: No cough, dyspnea Urologic: No hematuria, dysuria Abdominal:   No nausea, vomiting, diarrhea, bright red blood per rectum, melena, or hematemesis Neurologic:  No visual changes, wkns, changes in mental status. All other systems reviewed and  are otherwise negative except as noted above.  Physical Exam   Blood pressure (!) 113/94, pulse (!) 102, temperature 98.5 F (36.9 C), temperature source Oral, resp. rate 18, height _0  (1.753 m), weight 241 lb 14.4 oz (109.7 kg), SpO2 100 %.  General: Pleasant, NAD Psych: Normal affect. Neuro: Alert and oriented X 3. Moves all extremities spontaneously. HEENT: Normal  Neck: Supple without bruits or JVD. Lungs:  Resp regular and unlabored, CTA. Heart: Irregularly irregular rhythm,   no murmurs. Abdomen: Soft, non-tender, non-distended, BS + x 4.  Extremities: Bilateral BKA. Trace bilateral stump edema.   Labs    Troponin (Point of Care Test) No results for input(s): TROPIPOC in the last 72 hours. No results for input(s): CKTOTAL, CKMB, TROPONINI in the last 72 hours. Lab Results  Component Value Date   WBC 6.8 06/04/2016   HGB 13.5 06/04/2016   HCT 42.7 06/04/2016   MCV 88.6 06/04/2016   PLT 194 06/04/2016    Recent Labs Lab 06/04/16 0508  NA 137  K 3.9  CL 94*  CO2 32  BUN 11  CREATININE 1.56*  CALCIUM 8.5*  PROT 6.5  BILITOT 1.5*  ALKPHOS 180*  ALT 29  AST 32  GLUCOSE 156*   Lab Results  Component Value Date   CHOL 209 (H) 05/23/2015   HDL 45 05/23/2015   LDLCALC 142 (H) 05/23/2015   TRIG 108 05/23/2015   No results found for: [facility]  Radiology Studies    Ct Head Wo Contrast  Result Date: 05/30/2016 CLINICAL DATA:  Altered mental status. EXAM: CT HEAD WITHOUT CONTRAST TECHNIQUE: Contiguous axial images were obtained from the base of the skull through the vertex without intravenous contrast. COMPARISON:  Brain MRI 05/23/2015 FINDINGS: Brain: No acute intracranial hemorrhage. No focal mass lesion. No CT evidence of acute infarction. No midline shift or mass effect. No hydrocephalus. Basilar cisterns are patent. Remote infarction LEFT cerebellum. Generalized cortical atrophy. Periventricular white matter  hypodensities. Vascular: No hyperdense vessel or  unexpected calcification. Skull: Normal. Negative for fracture or focal lesion. Sinuses/Orbits: Paranasal sinuses and mastoid air cells are clear. Orbits are clear. Other: Subcutaneous lesion LEFT occipital region unchanged (Image 7, series 3). IMPRESSION: 1. No acute intracranial findings.  \ 2. White matter microvascular disease and atrophy. 3. Remote LEFT cerebellar infarction. Electronically Signed   By: Suzy Bouchard M.D.   On: 05/30/2016 19:03   Dg Chest Port 1 View  Result Date: 06/03/2016 CLINICAL DATA:  Atrial fibrillation and CHF. EXAM: PORTABLE CHEST 1 VIEW COMPARISON:  Chest x-rays dated 05/31/2016, 05/30/2016 and 03/25/2016. FINDINGS: Stable cardiomegaly. Median sternotomy wires appear grossly intact and stable in alignment. Lungs are presently clear. No pleural effusion or pneumothorax seen. No acute or suspicious osseous finding. IMPRESSION: Stable cardiomegaly. No active disease. No evidence of pneumonia. No evidence of active CHF at this time. Electronically Signed   By: Franki Cabot M.D.   On: 06/03/2016 18:19   Dg Chest Port 1 View  Result Date: 05/31/2016 CLINICAL DATA:  Cough and wheezing EXAM: PORTABLE CHEST 1 VIEW COMPARISON:  May 30, 2016 FINDINGS: There is persistent cardiomegaly with pulmonary venous hypertension. Currently there is no appreciable edema or consolidation. No pleural effusions evident. No adenopathy. Patient is status post median sternotomy. No bone lesions. IMPRESSION: Pulmonary vascular congestion without frank edema or consolidation. Stable cardiac enlargement. Electronically Signed   By: Lowella Grip III M.D.   On: 05/31/2016 07:32   Dg Chest Portable 1 View  Result Date: 05/30/2016 CLINICAL DATA:  Short of breath EXAM: PORTABLE CHEST 1 VIEW COMPARISON:  03/25/2016 FINDINGS: Postop CABG. Cardiac enlargement. Vascular congestion with mild edema. Small bilateral effusions bibasilar atelectasis. IMPRESSION: Congestive heart failure with mild edema  and small bilateral pleural effusions Electronically Signed   By: Franchot Gallo M.D.   On: 05/30/2016 13:32   US Abdomen Limited Ruq  Result Date: 06/04/2016 CLINICAL DATA:  71 year old male with elevated LFTs. Initial encounter. EXAM: US ABDOMEN LIMITED - RIGHT UPPER QUADRANT COMPARISON:  10/22/2015 CT. FINDINGS: Gallbladder: No gallstones. Minimal sludge. No gallbladder wall thickening or pericholecystic fluid. Patient not tender over this region during scanning per ultrasound technologist. Common bile duct: Diameter: 3.6 mm. Liver: No focal lesion identified. Within normal limits in parenchymal echogenicity. Right-sided pleural effusion. IMPRESSION: Minimal gallbladder sludge.  Gallbladder otherwise unremarkable. Right-sided pleural effusion. Electronically Signed   By: Genia Del M.D.   On: 06/04/2016 09:44    EKG & Cardiac Imaging   EKG: sinus rhythm 67 bpm. Poor r wave progression  Echocardiogram: Pending  Assessment & Plan    Acute on Chronic systolic heart failure -EF in 02/2016 was 10-15%. Plan was to repeat echo in 3 months and if no improvement in LV function would consider ICD placement (EF 25-30% in 05/2015) -Metoprolol has been switched to coreg. No ACE-I/ARB due to low BP and elevated SCr -Was diuresed with lasix 80 mg TID, now backed off to BID. Wt down from 288 lbs to 241 lbs. I&O net negative 20L since admission.  -Breathing is much better. He is able to lie flat to sleep and edema is greatly improved.  -Will recheck echo and consider EP consult for ICD is indicated -Pt denied taking diuretics prior to admission. It is unclear whether he knows what he takes or he is noncompliant. He will need to be on diuretic and should weigh daily.  Syncope -With severe diastolic dysfunction this could have been related to arrhythmia, but no  arrhythmias noted since admission. He has maintained sinus rhythm with PVC's. This is more likely related to his heart failure, volume overload and  respiratory failure.  Respiratory failure -Pt hypoxic and hypercapneic on admission. Improved with Narcan. Now diuresed over 40 pounds of fluid. Breathing and edema better.   CAD -S/P CABG 2001. NSTEMI and occluded LIMA to LAD- PTCA to LAD in 02/2016 with only modest improvement. He has known Occlusion of the native RCA and occlusion of the SVG to RCA. -Medical therapy includes Plavix, beta blocker, statin -Troponins 0.04, 0.05. Seems to be chronically mildly elevated (0.09 in 03/2016) -No chest pain at time of presentation or since then -Continue beta blocker, Plavix, statin. ARB is on hold due to renal function.    Paroxysmal atrial fibrillation -Maintaining sinus rhythm on metoprolol -CHA2DS2-VASc Score is 6 (CHF, age, stroke (2), DM, Vascular disease). He takes Xarelto for stroke risk reduction  CKD stage 2 -SCr 1.56 today. This is around his baseline. -Avoid nephrotoxic agents. Is off his ARB  OSA -Pt has been told that he has sleep apnea, but he has refused CPAP. I discussed the benefits of treating sleep apnea for heart health, but he currently does not want to pursue this.   DM -Followed by IM. S/P bilat BKA  Signed, Daune Perch, NP-C 06/04/2016, 12:25 PM Pager: [phone]   Patient seen and examined. Agree with assessment and plan.  Joung is a 71 year old African-American male who has a history of CAD and underwent CBG revascularization surgery in 2001 in [place].  He suffered an MI in November 2017.  Catheterization showed an occluded LIMA graft was LAD and native LAD occlusion for which she underwent PTCA of his native vessel.  He had a patent vein graft to several marginal branches.  He also had occluded native RCA and vein graft, which had supplied the RCA.  He has reduced LV function with an EF of 10-15% of ischemic etiology.  He has chronic kidney disease, a history of atrial flutter, previously anticoagulated with Xarelto, diabetes mellitus, and is status post  bilateral BKA's.  He was found by roommate unresponsive on [place] 22 and was felt to have acute hypercarbic respiratory acidosis.  A chest x-ray revealed pulmonary edema.  He developed acute encephalopathy.  He had improvement with Narcan.  His last echo in November 2017 showed an EF of 10-15%.  Since his admission, he has been aggressively diuresed with negative urine output of 20 L since admission and weight loss of 47 pounds.  Agree with switching metoprolol to carvedilol with his markedly reduced LV function.  Consider the addition of nitrates/hydralazine as BP allows with his chronic kidney disease (bilateral amputation with creatinine that had increased to 1.86 and improved to 1.56).  Continue anticoagulation with his markedly elevated cha2ds2vasc score of 6. LDL is significantly increased in this patient with CAD and diabetes and would add rosuvastatin 40 mg daily.  Recommend follow-up echo Doppler study to see if there has been any improvement in LV function.   Troy Sine, MD, [facility] 06/04/2016 5:34 PM

## 2016-06-04 NOTE — Clinical Social Work Note (Signed)
CSW called patient's step-daughter and gave bed offers. She will make decision tonight and either call and leave CSW a voicemail for tomorrow morning or CSW will call her in the morning.  Charlynn Court, CSW [phone]

## 2016-06-04 NOTE — Procedures (Signed)
ELECTROENCEPHALOGRAM REPORT  Date of Study: 06/04/2016  Patient's Name: Leroy [name] MRN: [id number] Date of Birth: 04/22/1945  Referring Provider: Dr. Albertine Grates  Clinical History: This is a 71 year old man with altered mental status  Medications: carvedilol (COREG) tablet 3.125 mg  clopidogrel (PLAVIX) tablet 75 mg  furosemide (LASIX) injection 80 mg  insulin aspart (novoLOG) injection 0-15 Units  ipratropium-albuterol (DUONEB) 0.5-2.5 (3) MG/3ML nebulizer solution 3 mL  pantoprazole (PROTONIX) EC tablet 40 mg  rivaroxaban (XARELTO) tablet 20 mg   Technical Summary: A multichannel digital EEG recording measured by the international 10-20 system with electrodes applied with paste and impedances below 5000 ohms performed as portable with EKG monitoring in an awake and drowsy patient.  Hyperventilation and photic stimulation were not performed.  The digital EEG was referentially recorded, reformatted, and digitally filtered in a variety of bipolar and referential montages for optimal display.   Description: The patient is awake and drowsy during the recording.  During maximal wakefulness, there is a symmetric, medium voltage 7 Hz posterior dominant rhythm that attenuates with eye opening. This is admixed with a small amount of diffuse 4-5 Hz theta and 2-3 Hz delta slowing of the waking background.  During drowsiness, there is an increase in theta and delta slowing of the background. Deeper stages of sleep were not seen. Hyperventilation and photic stimulation were not performed. There is EKG artifact seen in the study. There were no epileptiform discharges or electrographic seizures seen.    EKG lead showed sinus tachycardia.  Impression: This awake and drowsy EEG is abnormal due to mild to moderate diffuse slowing of the waking background.  Clinical Correlation of the above findings indicates diffuse cerebral dysfunction that is non-specific in etiology and can be seen with  hypoxic/ischemic injury, toxic/metabolic encephalopathies, neurodegenerative disorders, or medication effect.  The absence of epileptiform discharges does not rule out a clinical diagnosis of epilepsy.  Clinical correlation is advised.   Patrcia Dolly, M.D.

## 2016-06-04 NOTE — Consult Note (Signed)
   [facility] Inpatient Consult   06/04/2016  Leroy [name] 1946-03-14 [id number]  .Patient was assessed for [facility] for community services. This is the patient's 3rd hospitalization within the past 6 months.  Patient was previously active with [facility] but the community nurse was unable to have or maintain contact with the patient.  Met with patient at bedside regarding his contact information.  Patient endorses Dr. [name] as his primary care provider.  Patient states his cell phone number is [phone] and he has a voicemail but he does not answer calls he does not recognize.  He states his daughter is his primary person who helps him make decision.  Patient is being consider for some rehab at discharge at a skilled facility.  Patient states he uses [facility] for his pharmacy needs.  He denies transportation issues that would cause him to miss medical appointments.  Chart review reveals per PT note the patient admitted with with Respiratory Failure, HF, and AMS.  pt with hx of DM, HF, CAD, CABG, CKD, Bil BKAs, DVT, MI, Neuropathy, PVD, and CVA.    Patient was given a brochure to share with his daughter he states, "I am not sure about everything yet but, I know I will need help when I get home."  Will follow for disposition decision and determine if there are any [facility] Care Management needs.    For additional questions or referrals please contact:  Natividad Brood, RN BSN [facility] Liaison  [phone] business mobile phone Toll free office [phone]

## 2016-06-04 NOTE — Evaluation (Signed)
Physical Therapy Evaluation Patient Details Name: Leroy [name] MRN: [id number] DOB: [birth year] Today's Date: 06/04/2016   History of Present Illness  pt presents with Respiratory Failure, HF, and AMS.  pt with hx of DM, HF, CAD, CABG, CKD, Bil BKAs, DVT, MI, Neuropathy, PVD, and CVA.    Clinical Impression  Pt agreeable to OOB to chair, but indicates feeling weak and fatigued.  Pt generally weak and deconditioned limiting his ability to perform his transfers to/from a chair.  Pt does follow directions well and was eager to perform UE exercises after getting to chair.  Pt demonstrated W/C push-ups and Bil UE ROM exercises.  Feel pt may need increased care as opposed to returning to home.  Pt would benefit from SNF level of care at D/C.  Will continue to follow.      Follow Up Recommendations SNF    Equipment Recommendations  None recommended by PT    Recommendations for Other Services       Precautions / Restrictions Precautions Precautions: Fall Precaution Comments: pt indicates he has prosthetic LEs, but that he can't wear them due to wounds on Bil residual limbs.   Restrictions Weight Bearing Restrictions: No      Mobility  Bed Mobility Overal bed mobility: Needs Assistance Bed Mobility: Supine to Sit     Supine to sit: Min assist     General bed mobility comments: pt comes to long sitting in bed with heavy use of single bed rail and A for steadying only.    Transfers Overall transfer level: Needs assistance   Transfers: Lateral/Scoot Transfers          Lateral/Scoot Transfers: Min assist General transfer comment: Drop arm recliner placed on pt's R side with pillow on seat to make it more level with bed.  pt moves slowly and needs cues for positioning, but is able to scoot from bed to recliner with only MinA.  pt very effortful and indicates feeling weaker than normal.    Ambulation/Gait                Stairs            Wheelchair Mobility     Modified Rankin (Stroke Patients Only)       Balance Overall balance assessment: Needs assistance Sitting-balance support: No upper extremity supported;Feet supported Sitting balance-Leahy Scale: Fair Sitting balance - Comments: pt occasionally needs a single UE support during dynamic balance.                                       Pertinent Vitals/Pain Pain Assessment: No/denies pain    Home Living Family/patient expects to be discharged to:: Skilled nursing facility                 Additional Comments: Noted in MD notes that daughter is unsure she can continue to care for pt at home.      Prior Function Level of Independence: Needs assistance   Gait / Transfers Assistance Needed: Only transfers to/from W/C.  ADL's / Homemaking Assistance Needed: Needs A with hard to reach ADLs and family performs homemaking tasks.          Hand Dominance   Dominant Hand: Right    Extremity/Trunk Assessment   Upper Extremity Assessment Upper Extremity Assessment: Defer to OT evaluation    Lower Extremity Assessment Lower Extremity Assessment: Generalized weakness;RLE deficits/detail;LLE deficits/detail RLE  Deficits / Details: Old BKA.  Sensation intact, ROM functional, Strength grossly 4-/5.  pt with necrotic areas on residual limb.   LLE Deficits / Details: Old BKA.  Sensation intact, ROM functional, Strength grossly 4-/5.  Necrotic areas on residual limbs more so on L than R LE.      Cervical / Trunk Assessment Cervical / Trunk Assessment: Kyphotic  Communication   Communication: No difficulties  Cognition Arousal/Alertness: Awake/alert Behavior During Therapy: WFL for tasks assessed/performed Overall Cognitive Status: Within Functional Limits for tasks assessed                      General Comments      Exercises     Assessment/Plan    PT Assessment Patient needs continued PT services  PT Problem List Decreased strength;Decreased  activity tolerance;Decreased balance;Decreased mobility;Decreased knowledge of use of DME;Obesity;Decreased skin integrity       PT Treatment Interventions DME instruction;Functional mobility training;Therapeutic activities;Therapeutic exercise;Balance training;Patient/family education    PT Goals (Current goals can be found in the Care Plan section)  Acute Rehab PT Goals Patient Stated Goal: Get my strength back PT Goal Formulation: With patient Time For Goal Achievement: 06/18/16 Potential to Achieve Goals: Good    Frequency Min 2X/week   Barriers to discharge Decreased caregiver support      Co-evaluation               End of Session   Activity Tolerance: Patient limited by fatigue Patient left: in chair;with call bell/phone within reach;with chair alarm set;with nursing/sitter in room Nurse Communication: Mobility status PT Visit Diagnosis: Muscle weakness (generalized) (M62.81)         Time: 1155-2080 PT Time Calculation (min) (ACUTE ONLY): 30 min   Charges:   PT Evaluation $PT Eval Moderate Complexity: 1 Procedure PT Treatments $Therapeutic Activity: 8-22 mins   PT G CodesSunny Schlein, PT  [phone] 06/04/2016, 11:25 AM

## 2016-06-04 NOTE — Progress Notes (Signed)
EEG Completed; Results Pending  

## 2016-06-04 NOTE — Progress Notes (Signed)
PROGRESS NOTE  Leroy [name]:[id number] DOB: [birth year] DOA: 05/30/2016 PCP: [name], MD  HPI/Recap of past 24 hours:  He is much improved today, he is fully alert, sitting in chair, on room air, edema has much improved No fever, no cough, no chest pain   Assessment/Plan: Active Problems:   ATRIAL FIBRILLATION, PAROXYSMAL   Chronic systolic heart failure (HCC)   Atherosclerosis of native arteries of the extremities with ulceration (HCC)   Diabetes mellitus type 2 with peripheral artery disease (HCC)   Chronic renal disease, stage 2, mildly decreased glomerular filtration rate (GFR) between 60-89 mL/min/1.73 square meter   Altered mental status   Acute respiratory failure with hypoxia and hypercapnia (HCC)   Polypharmacy   Ischemic cardiomyopathy  Acute encephalopathy? unresponsiveness:  He was found by room mate unresponsiveness on 2/22, he was brought to the hospital by EMS, abg showed acute hypercarbic respiratory acidosis, he was admitted to icu , put on bipap CT head did show remote infarct, but no acute findings. EEG no seizure spikes. He also has hypoglycemia on presentation. Per pccm "On PCCM arrival pt administered narcan w/ almost immediate improvement in MS. " "Favor narcotic induced hypercarbia as seems to be responding to narcan" but urine tox negative for opioids.  Continue bipap qhs I have talked to patient 's main care giver ms Ellery Plunk who report patient has been drowsy and with slight confusion for the last month or two, I recommend outpatient sleep study. I have review patient's chart, he also has advanced chf with very low EF, so arrhythmia could also contribute to the unresponsiveness on 2/22, he has been in sinus rhythm while in the hospital this time  repeat echocardiogram pending, cardiology consulted. While he has been very lethargic the first few days in the hospital, he has improved, fully alert  on 2/27.   Acute respiratory failure with  hypoxia and hypercapnia  No previous diagnosis of copd but is a former smoker, no wheezing on exam Body mass index is 35.72 kg/m. Nebs, bipap qhs  Acute on Chronic systolic heart failure  Most recent EF 10-15% in 02/2016, per outpatient cardiology note, plan to repeat echo in three months and consider ICD placement if ef remain lowe repeat echocardiogram, cardiology consulted He was treated with iv lasix 80mg  TID, good urine output, no off o2 at rest, leg/stumps less edematous, weight down significantly, cr stable, change iv lasix to 80mg  bid on 2/26 Change lopressor to low dose coreg on 2/26, low bp and elevated cr prohibit acei /arbs for now  Paroxysmal afib:  Sinus rhythm this hospitalization change lopressor to coreg continue xarelto  CAD s/p CABG on plavix, xarelto, h/o statin allergy , but crestor listed on home med list Denies chest pain, chronic mild elevated troponin , at baseline  Insulin dependent dm2,  He presented with hypoglycemia a1c 8, continue insulin, hypoglycemia protocol  CKDIII, monitor cr, renal dosing meds  S/p left kba and right aka, family report patient has not been able to wear his prosthesis due to stump edema prior to hospitalization Stump edema has much improved with diuresis  Code Status: full  Family Communication: patient and daughter Leroy [name] over the phone with patient's permission  Disposition Plan: likely will need SNF, daughter Leroy [name] agrees   Consultants:  icu admit on 2/22  TRH pick up on 2/25  cardiology  Procedures:  bipap  Antibiotics:  none   Objective: BP 96/69 (BP Location: Left Arm)   Pulse 74  Temp 98.2 F (36.8 C) (Oral)   Resp 18   Ht 5\' 9"  (1.753 m)   Wt 109.7 kg (241 lb 14.4 oz) Comment: bed  SpO2 98%   BMI 35.72 kg/m   Intake/Output Summary (Last 24 hours) at 06/04/16 2256 Last data filed at 06/04/16 2241  Gross per 24 hour  Intake              240 ml  Output             3445 ml  Net             -3205 ml   Filed Weights   06/02/16 0548 06/03/16 0438 06/04/16 0447  Weight: 128.9 kg (284 lb 1.6 oz) 114.4 kg (252 lb 3.2 oz) 109.7 kg (241 lb 14.4 oz)    Exam:   General:  Drowsy, but better oriented today  Cardiovascular: RRR  Respiratory: diminished , no wheezing, no rales, no rhonchi  Abdomen: Soft/ND/NT, positive BS  Musculoskeletal: left bka, right aka, stump edema has much improved per patient  Neuro:  Drowsy, oriented to person only, follow commands  Data Reviewed: Basic Metabolic Panel:  Recent Labs Lab 05/31/16 0610 05/31/16 1633 06/01/16 0623 06/02/16 0522 06/03/16 0453 06/04/16 0508  NA 141 143 141 140 140 137  K 4.3 4.5 3.9 4.6 3.3* 3.9  CL 108 109 103 96* 94* 94*  CO2 28 27 28  35* 36* 32  GLUCOSE 62* 62* 107* 158* 200* 156*  BUN 10 9 9 12 11 11   CREATININE 1.52* 1.57* 1.57* 1.86* 1.63* 1.56*  CALCIUM 8.4* 8.5* 8.4* 8.4* 8.5* 8.5*  MG 2.0  --  1.7 1.9 1.7  --   PHOS 3.5  --  3.3 3.4 3.1  --    Liver Function Tests:  Recent Labs Lab 05/30/16 1325 06/02/16 0522 06/04/16 0508  AST 49* 44* 32  ALT 44 29 29  ALKPHOS 265* 195* 180*  BILITOT 0.5 1.2 1.5*  PROT 6.4* 5.7* 6.5  ALBUMIN 2.8* 2.5* 2.7*   No results for input(s): LIPASE, AMYLASE in the last 168 hours. No results for input(s): AMMONIA in the last 168 hours. CBC:  Recent Labs Lab 05/30/16 1325 05/31/16 0610 06/01/16 1610 06/02/16 0522 06/03/16 0453 06/04/16 0508  WBC 6.3 5.9 5.7 6.9 6.1 6.8  NEUTROABS 3.2  --   --   --   --   --   HGB 13.7 12.6* 11.6* 11.9* 13.0 13.5  HCT 44.0 40.9 38.3* 38.5* 42.2 42.7  MCV 90.9 90.1 90.3 89.3 89.8 88.6  PLT 183 163 162 180 178 194   Cardiac Enzymes:    Recent Labs Lab 05/31/16 0610 06/01/16 0623  TROPONINI 0.04* 0.05*   BNP (last 3 results)  Recent Labs  02/22/16 0403 03/25/16 0605 05/30/16 1325  BNP 157.6* 1,098.2* 1,357.3*    ProBNP (last 3 results) No results for input(s): PROBNP in the last 8760  hours.  CBG:  Recent Labs Lab 06/03/16 2141 06/04/16 0737 06/04/16 1141 06/04/16 1659 06/04/16 2145  GLUCAP 164* 160* 174* 195* 196*    Recent Results (from the past 240 hour(s))  Culture, blood (Routine x 2)     Status: None   Collection Time: 05/30/16  1:05 PM  Result Value Ref Range Status   Specimen Description BLOOD RIGHT ANTECUBITAL  Final   Special Requests IN PEDIATRIC BOTTLE  4CC  Final   Culture NO GROWTH 5 DAYS  Final   Report Status 06/04/2016 FINAL  Final  MRSA  PCR Screening     Status: Abnormal   Collection Time: 05/30/16  6:59 PM  Result Value Ref Range Status   MRSA by PCR POSITIVE (A) NEGATIVE Final    Comment:        The GeneXpert MRSA Assay (FDA approved for NASAL specimens only), is one component of a comprehensive MRSA colonization surveillance program. It is not intended to diagnose MRSA infection nor to guide or monitor treatment for MRSA infections. RESULT CALLED TO, READ BACK BY AND VERIFIED WITH: T.LILLEY,RN AT 2100 BY L.PITT 05/30/16   Culture, blood (Routine x 2)     Status: None   Collection Time: 05/30/16  7:29 PM  Result Value Ref Range Status   Specimen Description BLOOD LEFT HAND  Final   Special Requests IN PEDIATRIC BOTTLE 1CC  Final   Culture NO GROWTH 5 DAYS  Final   Report Status 06/04/2016 FINAL  Final     Studies: US Abdomen Limited Ruq  Result Date: 06/04/2016 CLINICAL DATA:  71 year old male with elevated LFTs. Initial encounter. EXAM: US ABDOMEN LIMITED - RIGHT UPPER QUADRANT COMPARISON:  10/22/2015 CT. FINDINGS: Gallbladder: No gallstones. Minimal sludge. No gallbladder wall thickening or pericholecystic fluid. Patient not tender over this region during scanning per ultrasound technologist. Common bile duct: Diameter: 3.6 mm. Liver: No focal lesion identified. Within normal limits in parenchymal echogenicity. Right-sided pleural effusion. IMPRESSION: Minimal gallbladder sludge.  Gallbladder otherwise unremarkable.  Right-sided pleural effusion. Electronically Signed   By: Lacy Duverney M.D.   On: 06/04/2016 09:44    Scheduled Meds: . carvedilol  3.125 mg Oral BID WC  . Chlorhexidine Gluconate Cloth  6 each Topical Q0600  . clopidogrel  75 mg Oral Daily  . furosemide  80 mg Intravenous BID  . hydrALAZINE  12.5 mg Oral Q8H  . insulin aspart  0-15 Units Subcutaneous TID WC  . [START ON 06/05/2016] isosorbide mononitrate  15 mg Oral Daily  . pantoprazole  40 mg Oral Q1200  . rivaroxaban  20 mg Oral Q supper    Continuous Infusions:   Time spent:  Mylinda Brook MD, PhD  [facility] Pager [phone]. If 7PM-7AM, please contact night-coverage at [email or web address], password [facility] 06/04/2016, 10:56 PM  LOS: 5 days

## 2016-06-04 NOTE — Clinical Social Work Note (Signed)
CSW met with patient to discuss SNF. He stated he was not sure if he wanted to do that or not but gave CSW permission to contact his daughter. CSW left voicemail with contact information.  [place], [place]

## 2016-06-04 NOTE — Progress Notes (Signed)
OT Cancellation Note  Patient Details Name: Leroy [name] MRN: [id number] DOB: 12-10-1945   Cancelled Treatment:    Reason Eval/Treat Not Completed: Patient at procedure or test/ unavailable  Will check back tomorrow 2/28 to proceed with eval.  Samuel Rittenhouse OTR/L 06/04/2016, 3:29 PM

## 2016-06-04 NOTE — Clinical Social Work Placement (Signed)
   CLINICAL SOCIAL WORK PLACEMENT  NOTE  Date:  06/04/2016  Patient Details  Name: Leroy [name] MRN: [id number] Date of Birth: Mar 30, 1946  Clinical Social Work is seeking post-discharge placement for this patient at the Skilled  Nursing Facility level of care (*CSW will initial, date and re-position this form in  chart as items are completed):  Yes   Patient/family provided with [facility] Clinical Social Work Department's list of facilities offering this level of care within the geographic area requested by the patient (or if unable, by the patient's family).  Yes   Patient/family informed of their freedom to choose among providers that offer the needed level of care, that participate in Medicare, Medicaid or managed care program needed by the patient, have an available bed and are willing to accept the patient.  Yes   Patient/family informed of [facility]'s ownership interest in [facility] and [facility], as well as of the fact that they are under no obligation to receive care at these facilities.  PASRR submitted to EDS on 06/04/16     PASRR number received on       Existing PASRR number confirmed on 06/04/16     FL2 transmitted to all facilities in geographic area requested by pt/family on 06/04/16     FL2 transmitted to all facilities within larger geographic area on       Patient informed that his/her managed care company has contracts with or will negotiate with certain facilities, including the following:            Patient/family informed of bed offers received.  Patient chooses bed at       Physician recommends and patient chooses bed at      Patient to be transferred to   on  .  Patient to be transferred to facility by       Patient family notified on   of transfer.  Name of family member notified:        PHYSICIAN Please sign FL2     Additional Comment:    _______________________________________________ Margarito Liner, LCSW 06/04/2016,  2:14 PM

## 2016-06-04 NOTE — Clinical Social Work Note (Signed)
Clinical Social Work Assessment  Patient Details  Name: Leroy [name] MRN: [id number] Date of Birth: [birth year]  Date of referral:  06/04/16               Reason for consult:  Facility Placement, Discharge Planning                Permission sought to share information with:  Facility Sport and exercise psychologist, Family Supports Permission granted to share information::  Yes, Verbal Permission Granted  Name::     Leroy [name]  Agency::  SNF's  Relationship::  Arts development officer Information:  [phone]  Housing/Transportation Living arrangements for the past 2 months:  Single Family Home Source of Information:  Patient, Medical Team, Adult Children Patient Interpreter Needed:  None Criminal Activity/Legal Involvement Pertinent to Current Situation/Hospitalization:  No - Comment as needed Significant Relationships:  Adult Children, Siblings Lives with:  Adult Children Do you feel safe going back to the place where you live?  Yes Need for family participation in patient care:  Yes (Comment)  Care giving concerns:  PT recommending SNF once medically stable for discharge.   Social Worker assessment / plan:  CSW met with patient. No supports at bedside. Patient not fully oriented. CSW introduced role and explained that PT recommendations would be discussed. Patient said he did not know if he was interested in SNF or not. Patient gave permission for CSW to call his step-daughter, Leroy [name]. CSW called and spoke with Ms. Caswell. She is agreeable to SNF. She does not have a preference but did tell CSW where she does not want him to go. No further concerns. CSW encouraged patient and his step-daughter to contact CSW as needed. CSW will continue to follow patient and his family for support and facilitate discharge to SNF once medically stable.  Employment status:  Retired Nurse, adult PT Recommendations:  [place] / Referral to  community resources:  [place]  Patient/Family's Response to care:  Patient not fully oriented. Patient's step-daughter agreeable to SNF placement. Patient's family supportive and involved in patient's care. Patient's step-daughter appreciated social work intervention.  Patient/Family's Understanding of and Emotional Response to Diagnosis, Current Treatment, and Prognosis:  Patient not fully oriented. Patient's step-daughter appears to have a good understanding of the reason for admission. Patient and his step-daughter appear happy with hospital care.  Emotional Assessment Appearance:  Appears stated age Attitude/Demeanor/Rapport:  Other (Pleasant) Affect (typically observed):  Accepting, Appropriate, Calm, Pleasant Orientation:  Oriented to Self, Oriented to Place, Oriented to  Time, Oriented to Situation Alcohol / Substance use:  Never Used Psych involvement (Current and /or in the community):  No (Comment)  Discharge Needs  Concerns to be addressed:  Care Coordination Readmission within the last 30 days:  No Current discharge risk:  Cognitively Impaired, Dependent with Mobility Barriers to Discharge:  Ship broker, Continued Medical Work up   Candie Chroman, LCSW 06/04/2016, 2:09 PM

## 2016-06-04 NOTE — NC FL2 (Signed)
[place] MEDICAID FL2 LEVEL OF CARE SCREENING TOOL     IDENTIFICATION  Patient Name: Leroy [name] Birthdate: 17-Feb-1946 Sex: male Admission Date (Current Location): 05/30/2016  [facility] and [place] Number:  Producer, television/film/video and Address:  [facility], [address], [place], [place] [address]      Provider Number: [id number]  Attending Physician Name and Address:  Albertine Grates, MD  Relative Name and Phone Number:       Current Level of Care: Hospital Recommended Level of Care: Skilled Nursing Facility Prior Approval Number:    Date Approved/Denied:   PASRR Number: [id number]  Discharge Plan: SNF    Current Diagnoses: Patient Active Problem List   Diagnosis Date Noted  . Polypharmacy 06/01/2016  . Acute respiratory failure with hypoxia and hypercapnia (HCC) 05/31/2016  . Altered mental status   . Chronic renal disease, stage 2, mildly decreased glomerular filtration rate (GFR) between 60-89 mL/min/1.73 square meter 04/30/2016  . Chronic bronchitis (HCC) 04/30/2016  . Noncompliance 02/23/2016  . Shortness of breath   . NSTEMI (non-ST elevated myocardial infarction) (HCC) 02/22/2016  . Essential hypertension 02/22/2016  . Ischemic colitis (HCC) 10/25/2015  . Hemispheric carotid artery syndrome   . Coronary artery disease involving coronary bypass graft of native heart with angina pectoris (HCC)   . PVD (peripheral vascular disease) (HCC)   . 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 Diabetes mellitus type 2 with peripheral artery disease (HCC) 05/22/2015  . 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 Microalbuminuria 09/29/2014  . 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 Phantom limb pain (HCC) 08/16/2013  . Pain in limb- Right stump 07/06/2013  . Atherosclerosis of native arteries of the extremities with ulceration (HCC) 06/25/2013  . S/P bilateral BKA (below knee amputation) (HCC) 04/21/2013  . Chronic systolic heart failure  (HCC) 30/86/5784  . Femoral artery stenosis (HCC) 03/25/2013  . S/P BKA (below knee amputation) bilateral (HCC) 09/10/2012  . SVT (supraventricular tachycardia) (HCC) 09/02/2012  . Hyperlipidemia with target LDL less than 70 01/02/2012  . ATRIAL FIBRILLATION, PAROXYSMAL 04/13/2010    Orientation RESPIRATION BLADDER Height & Weight     Self, Time, Place  Normal Incontinent, External catheter Weight: 241 lb 14.4 oz (109.7 kg) (bed) Height:  5\' 9"  (175.3 cm)  BEHAVIORAL SYMPTOMS/MOOD NEUROLOGICAL BOWEL NUTRITION STATUS   (None)  (History of stroke) Continent Diet (Heart healthy/carb modified)  AMBULATORY STATUS COMMUNICATION OF NEEDS Skin   Total Care Verbally Other (Comment) (Cracking. Dry skin/scabs on bilateral stumps. Nonhealing wound on right stump.)                       Personal Care Assistance Level of Assistance  Bathing, Feeding, Dressing Bathing Assistance: Limited assistance Feeding assistance: Independent Dressing Assistance: Maximum assistance     Functional Limitations Info  Sight, Hearing, Speech Sight Info: Adequate Hearing Info: Adequate Speech Info: Adequate    SPECIAL CARE FACTORS FREQUENCY  PT (By licensed PT), Blood pressure, OT (By licensed OT)     PT Frequency: 5 x week OT Frequency: 5 x week            Contractures Contractures Info: Not present    Additional Factors Info  Code Status, Allergies, Isolation Precautions Code Status Info: Full Allergies Info: Statin, Amlodipine Besylate, Cephalexin     Isolation Precautions Info: Contact: MRSA     Current Medications (06/04/2016):  This is the current hospital active medication list Current  Facility-Administered Medications  Medication Dose Route Frequency Provider Last Rate Last Dose  . 0.9 %  sodium chloride infusion  250 mL Intravenous PRN Simonne Martinet, NP      . carvedilol (COREG) tablet 3.125 mg  3.125 mg Oral BID WC Albertine Grates, MD   3.125 mg at 06/04/16 0729  . Chlorhexidine  Gluconate Cloth 2 % PADS 6 each  6 each Topical Q0600 Nelda Bucks, MD   6 each at 06/03/16 [phone]  . clopidogrel (PLAVIX) tablet 75 mg  75 mg Oral Daily Kalman Shan, MD   75 mg at 06/04/16 1028  . furosemide (LASIX) injection 80 mg  80 mg Intravenous BID Albertine Grates, MD   80 mg at 06/04/16 1027  . insulin aspart (novoLOG) injection 0-15 Units  0-15 Units Subcutaneous TID WC Albertine Grates, MD   3 Units at 06/04/16 1224  . ipratropium-albuterol (DUONEB) 0.5-2.5 (3) MG/3ML nebulizer solution 3 mL  3 mL Nebulization Q4H PRN Albertine Grates, MD      . pantoprazole (PROTONIX) EC tablet 40 mg  40 mg Oral Q1200 Kalman Shan, MD   40 mg at 06/04/16 1028  . rivaroxaban (XARELTO) tablet 20 mg  20 mg Oral Q supper Darl Householder Masters, RPH   20 mg at 06/03/16 1631     Discharge Medications: Please see discharge summary for a list of discharge medications.  Relevant Imaging Results:  Relevant Lab Results:   Additional Information SS#: [id number]  Margarito Liner, LCSW

## 2016-06-04 NOTE — Procedures (Signed)
Patient refused CPAP for the night  

## 2016-06-05 ENCOUNTER — Inpatient Hospital Stay (HOSPITAL_COMMUNITY): Payer: Medicare HMO

## 2016-06-05 DIAGNOSIS — I10 Essential (primary) hypertension: Secondary | ICD-10-CM

## 2016-06-05 DIAGNOSIS — N183 Chronic kidney disease, stage 3 (moderate): Secondary | ICD-10-CM

## 2016-06-05 DIAGNOSIS — I509 Heart failure, unspecified: Secondary | ICD-10-CM

## 2016-06-05 LAB — CBC
HCT: 41.7 % (ref 39.0–52.0)
HEMOGLOBIN: 13.3 g/dL (ref 13.0–17.0)
MCH: 28.1 pg (ref 26.0–34.0)
MCHC: 31.9 g/dL (ref 30.0–36.0)
MCV: 88 fL (ref 78.0–100.0)
PLATELETS: 185 10*3/uL (ref 150–400)
RBC: 4.74 MIL/uL (ref 4.22–5.81)
RDW: 15.8 % — ABNORMAL HIGH (ref 11.5–15.5)
WBC: 7.7 10*3/uL (ref 4.0–10.5)

## 2016-06-05 LAB — GLUCOSE, CAPILLARY
GLUCOSE-CAPILLARY: 159 mg/dL — AB (ref 65–99)
GLUCOSE-CAPILLARY: 159 mg/dL — AB (ref 65–99)
GLUCOSE-CAPILLARY: 215 mg/dL — AB (ref 65–99)
Glucose-Capillary: 260 mg/dL — ABNORMAL HIGH (ref 65–99)

## 2016-06-05 LAB — BRAIN NATRIURETIC PEPTIDE: B Natriuretic Peptide: 619.3 pg/mL — ABNORMAL HIGH (ref 0.0–100.0)

## 2016-06-05 LAB — HEPATITIS PANEL, ACUTE
HCV Ab: <0.1 s/co ratio (ref 0.0–0.9)
HEP A IGM: NEGATIVE
HEP B C IGM: NEGATIVE
Hepatitis B Surface Ag: NEGATIVE

## 2016-06-05 LAB — BASIC METABOLIC PANEL
Anion gap: 10 (ref 5–15)
BUN: 13 mg/dL (ref 6–20)
CO2: 35 mmol/L — ABNORMAL HIGH (ref 22–32)
Calcium: 8.9 mg/dL (ref 8.9–10.3)
Chloride: 92 mmol/L — ABNORMAL LOW (ref 101–111)
Creatinine, Ser: 1.55 mg/dL — ABNORMAL HIGH (ref 0.61–1.24)
GFR calc Af Amer: 51 mL/min — ABNORMAL LOW (ref >60)
GFR, EST NON AFRICAN AMERICAN: 44 mL/min — AB (ref >60)
Glucose, Bld: 168 mg/dL — ABNORMAL HIGH (ref 65–99)
POTASSIUM: 3.3 mmol/L — AB (ref 3.5–5.1)
SODIUM: 137 mmol/L (ref 135–145)

## 2016-06-05 LAB — ECHOCARDIOGRAM COMPLETE
Height: 69 in
WEIGHTICAEL: 3731.2 [oz_av]

## 2016-06-05 MED ORDER — FUROSEMIDE 80 MG PO TABS
80.0000 mg | ORAL_TABLET | Freq: Every day | ORAL | Status: DC
  Administered 2016-06-06 – 2016-06-07 (×2): 80 mg via ORAL
  Filled 2016-06-05 (×2): qty 1

## 2016-06-05 MED ORDER — POTASSIUM CHLORIDE CRYS ER 20 MEQ PO TBCR
40.0000 meq | EXTENDED_RELEASE_TABLET | Freq: Once | ORAL | Status: AC
  Administered 2016-06-05: 40 meq via ORAL
  Filled 2016-06-05: qty 2

## 2016-06-05 MED ORDER — PERFLUTREN LIPID MICROSPHERE
1.0000 mL | INTRAVENOUS | Status: AC | PRN
  Administered 2016-06-05: 2 mL via INTRAVENOUS
  Filled 2016-06-05: qty 10

## 2016-06-05 MED ORDER — INSULIN GLARGINE 100 UNIT/ML ~~LOC~~ SOLN
20.0000 [IU] | Freq: Every day | SUBCUTANEOUS | Status: DC
  Administered 2016-06-05: 20 [IU] via SUBCUTANEOUS
  Filled 2016-06-05: qty 0.2

## 2016-06-05 MED ORDER — FUROSEMIDE 80 MG PO TABS: 80.0000 mg | ORAL_TABLET | Freq: Two times a day (BID) | ORAL | Status: DC

## 2016-06-05 NOTE — Progress Notes (Signed)
Pt. With BP of 88/35. BP meds scheduled. On call NP for [facility] made aware. RN will continue to monitor pt. For changes in condition. Mileigh Tilley, Cheryll Dessert

## 2016-06-05 NOTE — Progress Notes (Addendum)
Progress Note  Patient Name: Leroy [name] Date of Encounter: 06/05/2016  Primary Cardiologist: Dr Antoine Poche  Subjective   Pt sleepy this morning. Does not want to wake up and talk to me. He is lying at about 15 degrees with easy breathing.   Inpatient Medications    Scheduled Meds: . carvedilol  3.125 mg Oral BID WC  . clopidogrel  75 mg Oral Daily  . furosemide  80 mg Intravenous BID  . hydrALAZINE  12.5 mg Oral Q8H  . insulin aspart  0-15 Units Subcutaneous TID WC  . isosorbide mononitrate  15 mg Oral Daily  . pantoprazole  40 mg Oral Q1200  . rivaroxaban  20 mg Oral Q supper   Continuous Infusions:  PRN Meds: sodium chloride, ipratropium-albuterol   Vital Signs    Vitals:   06/04/16 0447 06/04/16 1225 06/04/16 2045 06/05/16 0534  BP: (!) 113/94 110/82 96/69 (!) 88/35  Pulse: (!) 102 98 74 97  Resp: 18 18 18 18   Temp: 98.5 F (36.9 C) 98 F (36.7 C) 98.2 F (36.8 C) 98.5 F (36.9 C)  TempSrc: Oral Oral Oral Oral  SpO2: 100% 100% 98% 95%  Weight: 241 lb 14.4 oz (109.7 kg)   233 lb 3.2 oz (105.8 kg)  Height:        Intake/Output Summary (Last 24 hours) at 06/05/16 1008 Last data filed at 06/05/16 1003  Gross per 24 hour  Intake              895 ml  Output             2970 ml  Net            -2075 ml   Filed Weights   06/03/16 0438 06/04/16 0447 06/05/16 0534  Weight: 252 lb 3.2 oz (114.4 kg) 241 lb 14.4 oz (109.7 kg) 233 lb 3.2 oz (105.8 kg)    Telemetry    Sinus rhythm/sinus tach with rates int he 90's-low 100's - Personally Reviewed  ECG    No new tracings.   Physical Exam   GEN: No acute distress.   Neck: No JVD Cardiac: RRR, no murmurs, rubs, or gallops.  Respiratory: Clear to auscultation bilaterally. GI: Soft, nontender, non-distended  MS: No edema; Bilateral BKA Neuro:  Nonfocal  Psych: Normal affect   Labs    Chemistry Recent Labs Lab 05/30/16 1325  06/02/16 0522 06/03/16 0453 06/04/16 0508 06/05/16 0309  NA 141  < >  140 140 137 137  K 4.3  < > 4.6 3.3* 3.9 3.3*  CL 106  < > 96* 94* 94* 92*  CO2 27  < > 35* 36* 32 35*  GLUCOSE 62*  < > 158* 200* 156* 168*  BUN 12  < > 12 11 11 13   CREATININE 1.77*  < > 1.86* 1.63* 1.56* 1.55*  CALCIUM 8.6*  < > 8.4* 8.5* 8.5* 8.9  PROT 6.4*  --  5.7*  --  6.5  --   ALBUMIN 2.8*  --  2.5*  --  2.7*  --   AST 49*  --  44*  --  32  --   ALT 44  --  29  --  29  --   ALKPHOS 265*  --  195*  --  180*  --   BILITOT 0.5  --  1.2  --  1.5*  --   GFRNONAA 37*  < > 35* 41* 44* 44*  GFRAA 43*  < >  41* 48* 51* 51*  ANIONGAP 8  < > 9 10 11 10   < > = values in this interval not displayed.   Hematology Recent Labs Lab 06/03/16 0453 06/04/16 0508 06/05/16 0309  WBC 6.1 6.8 7.7  RBC 4.70 4.82 4.74  HGB 13.0 13.5 13.3  HCT 42.2 42.7 41.7  MCV 89.8 88.6 88.0  MCH 27.7 28.0 28.1  MCHC 30.8 31.6 31.9  RDW 16.0* 15.7* 15.8*  PLT 178 194 185    Cardiac Enzymes Recent Labs Lab 05/31/16 0610 06/01/16 0623  TROPONINI 0.04* 0.05*    Recent Labs Lab 05/30/16 1349  TROPIPOC 0.03     BNP Recent Labs Lab 05/30/16 1325  BNP 1,357.3*     DDimer No results for input(s): DDIMER in the last 168 hours.   Radiology    Dg Chest Port 1 View  Result Date: 06/03/2016 CLINICAL DATA:  Atrial fibrillation and CHF. EXAM: PORTABLE CHEST 1 VIEW COMPARISON:  Chest x-rays dated 05/31/2016, 05/30/2016 and 03/25/2016. FINDINGS: Stable cardiomegaly. Median sternotomy wires appear grossly intact and stable in alignment. Lungs are presently clear. No pleural effusion or pneumothorax seen. No acute or suspicious osseous finding. IMPRESSION: Stable cardiomegaly. No active disease. No evidence of pneumonia. No evidence of active CHF at this time. Electronically Signed   By: Bary Richard M.D.   On: 06/03/2016 18:19   US Abdomen Limited Ruq  Result Date: 06/04/2016 CLINICAL DATA:  71 year old male with elevated LFTs. Initial encounter. EXAM: US ABDOMEN LIMITED - RIGHT UPPER QUADRANT  COMPARISON:  10/22/2015 CT. FINDINGS: Gallbladder: No gallstones. Minimal sludge. No gallbladder wall thickening or pericholecystic fluid. Patient not tender over this region during scanning per ultrasound technologist. Common bile duct: Diameter: 3.6 mm. Liver: No focal lesion identified. Within normal limits in parenchymal echogenicity. Right-sided pleural effusion. IMPRESSION: Minimal gallbladder sludge.  Gallbladder otherwise unremarkable. Right-sided pleural effusion. Electronically Signed   By: Lacy Duverney M.D.   On: 06/04/2016 09:44    Cardiac Studies   Echo pending.   Patient Profile     71 y.o. male  with past medical history significant for CKD, atrial flutter anticoagualted with Xarelto, DM, bilateral BKA, ischemic cardiomyopathy with EF 10-15% per echo in 02/2016, CAD s/p CABG in 2001 in [place], NSTEMI 02/2016 who was found unresponsive by roommate. ABG showed acute hypercarbic respiratory acidosis. CXR showed pulmonary edema. He was admitted with acute encephalopathy.  Assessment & Plan    Acute on Chronic systolic heart failure -EF in 81/1914 was 10-15%. Plan was to repeat echo in 3 months and if no improvement in LV function would consider ICD placement (EF 25-30% in 05/2015) Currently awaiting Echo results.  -Metoprolol has been switched to coreg 3.125. No ACE-I/ARB due to low BP and elevated SCr -Was diuresed with lasix 80 mg TID, backed off to BID. Wt down from 288 lbs to 233 lbs. I&O net negative 21L since admission. Transition to oral dosing and reduce to once daliy. -Pt hypotensive. Will cut back on diuresis and BP may improve to allow for toleration of the hydralazine, isosorbide and coreg for treatment of heart failure.  -Breathing is much better. He is able to lie flat to sleep and edema is greatly improved.  -Will recheck echo and consider EP consult for ICD if indicated -Pt denied taking diuretics prior to admission. It is unclear whether he knows what he takes or if  he is noncompliant. He will need to be on diuretic and should weigh daily. He says that he  lives alone and daughter comes in to check on him.  Syncope -With severe diastolic dysfunction this could have been related to arrhythmia, but no arrhythmias noted since admission. He has maintained sinus rhythm with PVC's. This is more likely related to his heart failure, volume overload and respiratory failure.  Respiratory failure -Pt hypoxic and hypercapneic on admission. Improved with Narcan. Now diuresed over 40 pounds of fluid. Breathing and edema better.   CAD -S/P CABG 2001. NSTEMI and occluded LIMA to LAD- PTCA to LAD in 02/2016 with only modest improvement. He has known Occlusion of the native RCA and occlusion of the SVG to RCA. -Medical therapy includes Plavix, beta blocker, statin -Troponins 0.04, 0.05. Seems to be chronically mildly elevated (0.09 in 03/2016) -No chest pain at time of presentation or since then -Continue beta blocker, Plavix, statin. ARB is on hold due to renal function and hypotension.    Paroxysmal atrial fibrillation -Maintaining sinus rhythm on beta blocker -CHA2DS2-VASc Score is 6 (CHF, age, stroke (2), DM, Vascular disease). He takes Xarelto for stroke risk reduction  CKD stage 2 -SCr 1.55 today. This is around his baseline. -Avoid nephrotoxic agents. Is off his ARB. Also BP is low.   OSA -Pt has been told that he has sleep apnea, but he has refused CPAP. I discussed the benefits of treating sleep apnea for heart health, but he currently does not want to pursue this.   DM -Followed by IM. S/P bilat BKA   Signed, Berton Bon, NP  06/05/2016, 10:08 AM    Patient seen and examined. Agree with assessment and plan. Significant diuresis since admission with 55 lb weight loss. Will decrease lasix from iv 80 mg bid to oral 80 mg daily.  Low dose nitrates/hydralazine added with significant ischemic cardiomyopathy EF 10 - 15%. F/U echo is pending.  Re-check BNP.   Lennette Bihari, MD, [facility] 06/05/2016 11:27 AM

## 2016-06-05 NOTE — Clinical Social Work Note (Addendum)
CSW called patient's step-daughter, [name]. She has chosen [facility]. CSW is awaiting call back from facility. Per MD yesterday, patient should be stable to discharge today. CSW will fax clinicals to [facility] once response is received from SNF.  Charlynn Court, CSW [phone]  10:12 am CSW faxed clinicals to [facility] CSW left confidential voicemail for [employer] logistics/support/administrative officer at [facility] to make her aware. Per MD in progression this morning, she is consulting cardiology and he may or may not discharge today depending on their findings. Per admissions coordinator at SNF, the authorization is good for 48 hours. CSW updated patient's stepdaughter. She does not get off work until 3:30 pm and can do paperwork at the facility after that if he does discharge today.  Charlynn Court, CSW [phone]  11:35 am CSW received confirmation from [facility] that they received clinicals and would review. CSW faxed OT evaluation as well.  Charlynn Court, CSW [phone]

## 2016-06-05 NOTE — Progress Notes (Signed)
BP 87/52; HR 91; held scheduled hydralazine 25mg , and MD has been notified.  Pt is asymptomatic.

## 2016-06-05 NOTE — Progress Notes (Addendum)
Patient ID: Leroy [name], male   DOB: May 28, 1945, 71 y.o.   MRN: [id number]  PROGRESS NOTE    KAIRON PELOSO  LTJ:[id number] DOB: [birth year] DOA: 05/30/2016  PCP: [name], MD   Brief Narrative:  71 year old male with extensive cardiac history including 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 Patient also has bilateral BKA.  Patient presented to ED 05/30/2016 with altered mental status, found by his roommate unresponsive, EMS was called and patient was placed on NIPPV. Patient was seen by critical care on the admission, Narcan was administered with almost immediate improvement in mental status. Altered mental status thought to be secondary to narcotic-induced encephalopathy and hypercarbia. Patient was seen by cardiology in consultation for acute on chronic systolic heart failure. He has been aggressively diuresed.   Assessment & Plan:   Acute encephalopathy, likely drug-induced - Thought to be secondary to narcotics this patient's mental status improved almost immediately with Narcan - CT head showed remote infarct but no acute findings - EEG did not show seizure activity; there was diffuse slowing but nonspecific  - Mental status stable  Acute on chronic systolic heart failure / acute respiratory failure with hypoxia and hypercapnia - Ejection fraction in November 2017 was 10-15% - Echo on this admission is pending - Patient was diuresed with Lasix 80 mg 3 times daily with significant weight down so currently Lasix is 80 mg daily - Continue daily weight and strict intake and output - Appreciate cardiology following and their recommendations  CAD status post CABG 2001 / NSTEMI and  occluded LIMA to LAD- PTCA to LAD in 02/2016  - Cardiology following  - Troponin 0.04, 0.05, seems to be chronically mildly elevated, 0.09 in December 2017 - Continue beta blocker, Plavix, statin - ARB on hold due to renal insufficiency   Paroxysmal atrial fibrillation - CHADS vasc score 6 - Continue anticoagulation with xarelto and Plavix - Continue rate controlled with Coreg  Chronic kidney disease stage III - Baseline creatinine 1.7 in January 2014 - Creatinine within baseline range  Hypokalemia - Due to Lasix - Supplemented  Essential hypertension - Continue carvedilol, hydralazine, isosorbide  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 - Continue sliding scale insulin - Resume patient's Lantus at bedtime, instead of 25 units will order 20 units and will see what CBGs are in a 24 hours - Last CBG 260   DVT prophylaxis: On xarelto  Code Status: full code  Family Communication: At the bedside person by name Selena Batten but did not want to identify how she related to the patient Disposition Plan: To skilled nursing facility once cleared by cardiology   Consultants:   Cardio   TRH as primary 06/03/2016   Procedures:   ECHO - pending   EEG 05/15/2016 - mild to moderate diffuse slowing of the waking background but nonspecific in etiology  Antimicrobials:   None    Subjective: No overnight events.   Objective: Vitals:   06/04/16 2045 06/05/16 0534 06/05/16 1201 06/05/16 1251  BP: 96/69 (!) 88/35 (!) 115/36 (!) 87/52  Pulse: 74 97 91 91  Resp: 18 18 18    Temp: 98.2 F (36.8 C) 98.5 F (36.9 C) 98  F (36.7 C)   TempSrc: Oral Oral Oral   SpO2: 98% 95% 95%   Weight:  105.8 kg (233 lb 3.2 oz)    Height:        Intake/Output Summary (Last 24 hours) at 06/05/16 1321 Last data filed at 06/05/16 1300  Gross per 24 hour  Intake              655 ml  Output             3020 ml  Net            -2365 ml   Filed Weights   06/03/16 0438  06/04/16 0447 06/05/16 0534  Weight: 114.4 kg (252 lb 3.2 oz) 109.7 kg (241 lb 14.4 oz) 105.8 kg (233 lb 3.2 oz)    Examination:  General exam: Appears calm and comfortable  Respiratory system: Clear to auscultation. Respiratory effort normal. Cardiovascular system: S1 & S2 heard, rate controlled  Gastrointestinal system: Abdomen is nondistended, soft and nontender. No organomegaly or masses felt. Normal bowel sounds heard. Central nervous system: No focal neurological deficits. Extremities: bilateral BKA Skin: No rashes, lesions or ulcers Psychiatry: Mood & affect appropriate.   Data Reviewed: I have personally reviewed following labs and imaging studies  CBC:  Recent Labs Lab 05/30/16 1325  06/01/16 0623 06/02/16 0522 06/03/16 0453 06/04/16 0508 06/05/16 0309  WBC 6.3  < > 5.7 6.9 6.1 6.8 7.7  NEUTROABS 3.2  --   --   --   --   --   --   HGB 13.7  < > 11.6* 11.9* 13.0 13.5 13.3  HCT 44.0  < > 38.3* 38.5* 42.2 42.7 41.7  MCV 90.9  < > 90.3 89.3 89.8 88.6 88.0  PLT 183  < > 162 180 178 194 185  < > = values in this interval not displayed. Basic Metabolic Panel:  Recent Labs Lab 05/31/16 0610  06/01/16 4098 06/02/16 0522 06/03/16 0453 06/04/16 0508 06/05/16 0309  NA 141  < > 141 140 140 137 137  K 4.3  < > 3.9 4.6 3.3* 3.9 3.3*  CL 108  < > 103 96* 94* 94* 92*  CO2 28  < > 28 35* 36* 32 35*  GLUCOSE 62*  < > 107* 158* 200* 156* 168*  BUN 10  < > 9 12 11 11 13   CREATININE 1.52*  < > 1.57* 1.86* 1.63* 1.56* 1.55*  CALCIUM 8.4*  < > 8.4* 8.4* 8.5* 8.5* 8.9  MG 2.0  --  1.7 1.9 1.7  --   --   PHOS 3.5  --  3.3 3.4 3.1  --   --   < > = values in this interval not displayed. GFR: Estimated Creatinine Clearance: 53.9 mL/min (by C-G formula based on SCr of 1.55 mg/dL (H)). Liver Function Tests:  Recent Labs Lab 05/30/16 1325 06/02/16 0522 06/04/16 0508  AST 49* 44* 32  ALT 44 29 29  ALKPHOS 265* 195* 180*  BILITOT 0.5 1.2 1.5*  PROT 6.4* 5.7* 6.5  ALBUMIN  2.8* 2.5* 2.7*   No results for input(s): LIPASE, AMYLASE in the last 168 hours. No results for input(s): AMMONIA in the last 168 hours. Coagulation Profile:  Recent Labs Lab 05/30/16 1325  INR 1.25   Cardiac Enzymes:  Recent Labs Lab 05/31/16 0610 06/01/16 0623  TROPONINI 0.04* 0.05*   BNP (last 3 results) No results for input(s): PROBNP in the last 8760 hours. HbA1C:  Recent Labs  06/03/16 0453  HGBA1C 8.0*   CBG:  Recent Labs Lab 06/04/16 1141 06/04/16 1659 06/04/16 2145 06/05/16 0739 06/05/16 1136  GLUCAP 174* 195* 196* 159* 260*   Lipid Profile: No results for input(s): CHOL, HDL, LDLCALC, TRIG, CHOLHDL, LDLDIRECT in the last 72 hours. Thyroid Function Tests:  Recent Labs  06/03/16 0453  TSH 1.582   Anemia Panel: No results for input(s): VITAMINB12, FOLATE, FERRITIN, TIBC, IRON, RETICCTPCT in the last 72 hours. Urine analysis:    Component Value Date/Time   COLORURINE YELLOW 05/30/2016 2142   APPEARANCEUR HAZY (A) 05/30/2016 2142   LABSPEC 1.014 05/30/2016 2142   PHURINE 5.0 05/30/2016 2142   GLUCOSEU NEGATIVE 05/30/2016 2142   GLUCOSEU NEGATIVE 11/13/2015 1148   HGBUR NEGATIVE 05/30/2016 2142   BILIRUBINUR NEGATIVE 05/30/2016 2142   KETONESUR NEGATIVE 05/30/2016 2142   PROTEINUR 100 (A) 05/30/2016 2142   UROBILINOGEN 0.2 11/13/2015 1148   NITRITE NEGATIVE 05/30/2016 2142   LEUKOCYTESUR NEGATIVE 05/30/2016 2142   Sepsis Labs: @LABRCNTIP (procalcitonin:4,lacticidven:4)   Culture, blood (Routine x 2)     Status: None   Collection Time: 05/30/16  1:05 PM  Result Value Ref Range Status   Specimen Description BLOOD RIGHT ANTECUBITAL  Final   Special Requests IN PEDIATRIC BOTTLE  4CC  Final   Culture NO GROWTH 5 DAYS  Final   Report Status 06/04/2016 FINAL  Final  MRSA PCR Screening     Status: Abnormal   Collection Time: 05/30/16  6:59 PM  Result Value Ref Range Status   MRSA by PCR POSITIVE (A) NEGATIVE Final  Culture, blood (Routine x  2)     Status: None   Collection Time: 05/30/16  7:29 PM  Result Value Ref Range Status   Specimen Description BLOOD LEFT HAND  Final   Special Requests IN PEDIATRIC BOTTLE 1CC  Final   Culture NO GROWTH 5 DAYS  Final   Report Status 06/04/2016 FINAL  Final      Radiology Studies: Dg Chest Port 1 View Result Date: 06/03/2016 Stable cardiomegaly. No active disease. No evidence of pneumonia. No evidence of active CHF at this time.    US Abdomen Limited Ruq Result Date: 06/04/2016 Minimal gallbladder sludge.  Gallbladder otherwise unremarkable. Right-sided pleural effusion.     Scheduled Meds: . carvedilol  3.125 mg Oral BID WC  . clopidogrel  75 mg Oral Daily  . [START ON 06/06/2016] furosemide  80 mg Oral Daily  . hydrALAZINE  12.5 mg Oral Q8H  . insulin aspart  0-15 Units Subcutaneous TID WC  . isosorbide mononitrate  15 mg Oral Daily  . pantoprazole  40 mg Oral Q1200  . rivaroxaban  20 mg Oral Q supper   Continuous Infusions:   LOS: 6 days    Time spent: 25 minutes  Greater than 50% of the time spent on counseling and coordinating the care.   Manson Passey, MD [facility] Pager [phone]  If 7PM-7AM, please contact night-coverage [email or web address] Password [email or web address] 06/05/2016, 1:21 PM

## 2016-06-05 NOTE — Evaluation (Signed)
Occupational Therapy Evaluation Patient Details Name: Leroy [name] MRN: [id number] DOB: 28-Jan-1946 Today's Date: 06/05/2016    History of Present Illness pt presents with Respiratory Failure, HF, and AMS.  pt with hx of DM, HF, CAD, CABG, CKD, Bil BKAs, DVT, MI, Neuropathy, PVD, and CVA.     Clinical Impression   Pt with decline in function and safety with ADLs and ADL mobility with decreased strength, balance, activity tolerance and cognition. Pt reported different answers with repeatd questions for OT for clarification of home assist info and equipment. Pt requires min A with bed mobility to sit EOB. Pt sat EOB ~ 10 minutes for simulated ADL tasks, required mod A for LB ADLs and min guard A for UB ADLs. Pt declined lateral scoot transfers. Pt and his daughter planing for him to d/c to [facility] for rehab before returning home within the next 1-2 days. No further acute OT indicated at this time, will defer further OT intervention to SNF    Follow Up Recommendations  SNF;Supervision/Assistance - 24 hour    Equipment Recommendations  None recommended by OT;Other (comment) (TBD at next venue of care)    Recommendations for Other Services       Precautions / Restrictions Precautions Precautions: Fall Precaution Comments: pt indicates he has prosthetic LEs, but that he can't wear them due to wounds on Bil residual limbs.   Restrictions Weight Bearing Restrictions: No      Mobility Bed Mobility Overal bed mobility: Needs Assistance Bed Mobility: Supine to Sit;Sit to Supine     Supine to sit: Min assist Sit to supine: Min assist   General bed mobility comments: pt use bed rails, min A to elevate trunk  Transfers Overall transfer level: Needs assistance               General transfer comment: pt declined lateral scoot transfer to recliner. Per PT note, pt transfes min A using latral scoot technique    Balance   Sitting-balance support: No upper extremity  supported Sitting balance-Leahy Scale: Fair                                      ADL Overall ADL's : Needs assistance/impaired     Grooming: Wash/dry hands;Wash/dry face;Min guard;Sitting   Upper Body Bathing: Min guard;Sitting   Lower Body Bathing: Moderate assistance;Sitting/lateral leans   Upper Body Dressing : Min guard;Sitting   Lower Body Dressing: Moderate assistance;Sitting/lateral leans     Toilet Transfer Details (indicate cue type and reason): pt declined                 Vision   Vision Assessment?: No apparent visual deficits                Pertinent Vitals/Pain Pain Assessment: No/denies pain     Hand Dominance Right   Extremity/Trunk Assessment Upper Extremity Assessment Upper Extremity Assessment: Generalized weakness   Lower Extremity Assessment Lower Extremity Assessment: Defer to PT evaluation   Cervical / Trunk Assessment Cervical / Trunk Assessment: Kyphotic   Communication Communication Communication: No difficulties   Cognition Arousal/Alertness: Awake/alert Behavior During Therapy: WFL for tasks assessed/performed Overall Cognitive Status: No family/caregiver present to determine baseline cognitive functioning                 General Comments: pt reported differrent amounts of time that "caregiver" spends at home when asked x 3  for clarification   General Comments   pt pleasant and cooperative                 Home Living Family/patient expects to be discharged to:: Skilled nursing facility Living Arrangements: Other (Comment) (with roommate)                               Additional Comments: Noted in MD notes that daughter is unsure she can continue to care for pt at home.        Prior Functioning/Environment Level of Independence: Needs assistance  Gait / Transfers Assistance Needed: Only transfers to/from W/C. ADL's / Homemaking Assistance Needed: Needs A with hard to reach  ADLs and family performs homemaking tasks.              OT Problem List: Decreased strength;Decreased activity tolerance;Decreased cognition;Impaired balance (sitting and/or standing);Decreased knowledge of use of DME or AE;Obesity      OT Treatment/Interventions:      OT Goals(Current goals can be found in the care plan section) Acute Rehab OT Goals Patient Stated Goal: Get my strength back OT Goal Formulation: With patient  OT Frequency:     Barriers to D/C: Decreased caregiver support                        End of Session    Activity Tolerance: Patient limited by fatigue Patient left: in bed;with call bell/phone within reach  OT Visit Diagnosis: Muscle weakness (generalized) (M62.81);Other symptoms and signs involving cognitive function                ADL either performed or assessed with clinical judgement  Time: 0911-0936 OT Time Calculation (min): 25 min Charges:  OT General Charges $OT Visit: 1 Procedure OT Evaluation $OT Eval Moderate Complexity: 1 Procedure OT Treatments $Therapeutic Activity: 8-22 mins G-Codes: OT G-codes **NOT FOR INPATIENT CLASS** Functional Assessment Tool Used: AM-PAC 6 Clicks Daily Activity     Galen Manila 06/05/2016, 10:36 AM

## 2016-06-05 NOTE — Progress Notes (Signed)
Holding hydralazine due to soft blood pressure  Manson Passey [facility] [phone]

## 2016-06-05 NOTE — Progress Notes (Signed)
  Echocardiogram 2D Echocardiogram with Definity has been performed.  Cathie Beams 06/05/2016, 3:33 PM

## 2016-06-06 DIAGNOSIS — I7025 Atherosclerosis of native arteries of other extremities with ulceration: Secondary | ICD-10-CM

## 2016-06-06 DIAGNOSIS — E876 Hypokalemia: Secondary | ICD-10-CM

## 2016-06-06 LAB — GLUCOSE, CAPILLARY
GLUCOSE-CAPILLARY: 164 mg/dL — AB (ref 65–99)
GLUCOSE-CAPILLARY: 168 mg/dL — AB (ref 65–99)
Glucose-Capillary: 305 mg/dL — ABNORMAL HIGH (ref 65–99)
Glucose-Capillary: 141 mg/dL — ABNORMAL HIGH (ref 65–99)

## 2016-06-06 LAB — BASIC METABOLIC PANEL
Anion gap: 8 (ref 5–15)
BUN: 13 mg/dL (ref 6–20)
CHLORIDE: 96 mmol/L — AB (ref 101–111)
CO2: 34 mmol/L — AB (ref 22–32)
CREATININE: 1.6 mg/dL — AB (ref 0.61–1.24)
Calcium: 8.7 mg/dL — ABNORMAL LOW (ref 8.9–10.3)
GFR calc Af Amer: 49 mL/min — ABNORMAL LOW (ref >60)
GFR calc non Af Amer: 42 mL/min — ABNORMAL LOW (ref >60)
GLUCOSE: 162 mg/dL — AB (ref 65–99)
Potassium: 3.6 mmol/L (ref 3.5–5.1)
SODIUM: 138 mmol/L (ref 135–145)

## 2016-06-06 MED ORDER — INSULIN ASPART 100 UNIT/ML ~~LOC~~ SOLN
3.0000 [IU] | Freq: Three times a day (TID) | SUBCUTANEOUS | Status: DC
  Administered 2016-06-06 – 2016-06-11 (×14): 3 [IU] via SUBCUTANEOUS

## 2016-06-06 MED ORDER — INSULIN GLARGINE 100 UNIT/ML ~~LOC~~ SOLN
25.0000 [IU] | Freq: Every day | SUBCUTANEOUS | Status: DC
  Administered 2016-06-06 – 2016-06-10 (×5): 25 [IU] via SUBCUTANEOUS
  Filled 2016-06-06 (×6): qty 0.25

## 2016-06-06 MED ORDER — HYDRALAZINE HCL 25 MG PO TABS
25.0000 mg | ORAL_TABLET | Freq: Three times a day (TID) | ORAL | Status: DC
  Administered 2016-06-06 – 2016-06-07 (×3): 25 mg via ORAL
  Filled 2016-06-06 (×3): qty 1

## 2016-06-06 NOTE — Progress Notes (Signed)
Pt. BP 102/51. Scheduled BP meds ordered. On call NP, Kirtland Bouchard Schorr, for [facility] made aware via text page. RN will continue to monitor pt. For changes in condition. Silus Lanzo, Clydie Braun Cherrell]

## 2016-06-06 NOTE — Progress Notes (Signed)
Pt. Refused CPAP during the night. Resting well in bed. No distress noted.

## 2016-06-06 NOTE — Progress Notes (Addendum)
Patient ID: Leroy [name], male   DOB: 05/24/1945, 71 y.o.   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 Patient also has bilateral BKA.  Patient presented to ED 05/30/2016 with altered mental status, found by his roommate unresponsive, EMS was called and patient was placed on NIPPV. Patient was seen by critical care on the admission, Narcan was administered with almost immediate improvement in mental status. Altered mental status thought to be secondary to narcotic-induced encephalopathy and hypercarbia. Patient was seen by cardiology in consultation for acute on chronic systolic heart failure.    Assessment & Plan:   Acute encephalopathy, likely drug-induced - Thought to be secondary to narcotics this patient's mental status improved almost immediately with Narcan - CT head showed remote infarct but no acute findings - EEG did not show seizure activity; there was diffuse slowing but nonspecific - Stable metal status   Acute on chronic systolic heart failure / acute respiratory failure with hypoxia and hypercapnia - Ejection fraction in November 2017 was 10-15% - Echo on this admission with EF 10% - Patient was diuresed with Lasix 80 mg 3 times daily with significant weight down so currently Lasix is 80 mg daily - Cardio increased hydralazine to 25 mg Q 8 hours and tomorrow they will increase nitrates; ultimately pt needs to have bidil dose increased per cardio  - Continue daily weight and strict  intake and output - Appreciate cardiology following   CAD status post CABG 2001 / NSTEMI and occluded LIMA to LAD- PTCA to LAD in 02/2016  - Cardiology following  - Troponin 0.04, 0.05, seems to be chronically mildly elevated, 0.09 in December 2017 - Continue beta blocker, Plavix, statin - ARB on hold due to renal insufficiency   Paroxysmal atrial fibrillation - CHADS vasc score 6 - Continue anticoagulation with xarelto and Plavix - Rate controlled with Coreg  Chronic kidney disease stage III - Baseline creatinine 1.7 in January 2014 - Creatinine within baseline range  Hypokalemia - Due to Lasix - Supplemented - Follow up BMP in am  Essential hypertension - Continue carvedilol, hydralazine, isosorbide  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   Objective: Vitals:   06/05/16 1815 06/05/16 2008 06/06/16 0624 06/06/16 1213  BP: 128/89 110/79 (!) 102/51 104/62  Pulse: 94  94 82  Resp:  20 20 20   Temp:  98.6 F (  37 C) 98 F (36.7 C) 97.8 F (36.6 C)  TempSrc:  Oral Oral Oral  SpO2:  96% 94% 94%  Weight:   108.5 kg (239 lb 4.8 oz)   Height:        Intake/Output Summary (Last 24 hours) at 06/06/16 1406 Last data filed at 06/06/16 1141  Gross per 24 hour  Intake             1060 ml  Output             1375 ml  Net             -315 ml   Filed Weights    06/04/16 0447 06/05/16 0534 06/06/16 0624  Weight: 109.7 kg (241 lb 14.4 oz) 105.8 kg (233 lb 3.2 oz) 108.5 kg (239 lb 4.8 oz)    Examination:  General exam: Appears calm and comfortable, no distress  Respiratory system: No wheezing, no rhonchi  Cardiovascular system: S1 & S2 heard, rate controlled  Gastrointestinal system: (+) BS, non tender  Central nervous system: Nonfocal  Extremities: bilateral BKA Skin: warm, dry  Psychiatry: Normal mood and behavior   Data Reviewed: I have personally reviewed following labs and imaging studies  CBC:  Recent Labs Lab 06/01/16 0623 06/02/16 0522 06/03/16 0453 06/04/16 0508 06/05/16 0309  WBC 5.7 6.9 6.1 6.8 7.7  HGB 11.6* 11.9* 13.0 13.5 13.3  HCT 38.3* 38.5* 42.2 42.7 41.7  MCV 90.3 89.3 89.8 88.6 88.0  PLT 162 180 178 194 185   Basic Metabolic Panel:  Recent Labs Lab 05/31/16 0610  06/01/16 0623 06/02/16 0522 06/03/16 0453 06/04/16 0508 06/05/16 0309 06/06/16 0414  NA 141  < > 141 140 140 137 137 138  K 4.3  < > 3.9 4.6 3.3* 3.9 3.3* 3.6  CL 108  < > 103 96* 94* 94* 92* 96*  CO2 28  < > 28 35* 36* 32 35* 34*  GLUCOSE 62*  < > 107* 158* 200* 156* 168* 162*  BUN 10  < > 9 12 11 11 13 13   CREATININE 1.52*  < > 1.57* 1.86* 1.63* 1.56* 1.55* 1.60*  CALCIUM 8.4*  < > 8.4* 8.4* 8.5* 8.5* 8.9 8.7*  MG 2.0  --  1.7 1.9 1.7  --   --   --   PHOS 3.5  --  3.3 3.4 3.1  --   --   --   < > = values in this interval not displayed. GFR: Estimated Creatinine Clearance: 52.9 mL/min (by C-G formula based on SCr of 1.6 mg/dL (H)). Liver Function Tests:  Recent Labs Lab 06/02/16 0522 06/04/16 0508  AST 44* 32  ALT 29 29  ALKPHOS 195* 180*  BILITOT 1.2 1.5*  PROT 5.7* 6.5  ALBUMIN 2.5* 2.7*   No results for input(s): LIPASE, AMYLASE in the last 168 hours. No results for input(s): AMMONIA in the last 168 hours. Coagulation Profile: No results for input(s): INR, PROTIME in the last 168 hours. Cardiac Enzymes:  Recent Labs Lab  05/31/16 0610 06/01/16 0623  TROPONINI 0.04* 0.05*   BNP (last 3 results) No results for input(s): PROBNP in the last 8760 hours. HbA1C: No results for input(s): HGBA1C in the last 72 hours. CBG:  Recent Labs Lab 06/05/16 1136 06/05/16 1613 06/05/16 2105 06/06/16 0741 06/06/16 1113  GLUCAP 260* 159* 215* 164* 305*   Lipid Profile: No results for input(s): CHOL, HDL, LDLCALC, TRIG, CHOLHDL, LDLDIRECT in the last 72 hours. Thyroid Function Tests: No  results for input(s): TSH, T4TOTAL, FREET4, T3FREE, THYROIDAB in the last 72 hours. Anemia Panel: No results for input(s): VITAMINB12, FOLATE, FERRITIN, TIBC, IRON, RETICCTPCT in the last 72 hours. Urine analysis:    Component Value Date/Time   COLORURINE YELLOW 05/30/2016 2142   APPEARANCEUR HAZY (A) 05/30/2016 2142   LABSPEC 1.014 05/30/2016 2142   PHURINE 5.0 05/30/2016 2142   GLUCOSEU NEGATIVE 05/30/2016 2142   GLUCOSEU NEGATIVE 11/13/2015 1148   HGBUR NEGATIVE 05/30/2016 2142   BILIRUBINUR NEGATIVE 05/30/2016 2142   KETONESUR NEGATIVE 05/30/2016 2142   PROTEINUR 100 (A) 05/30/2016 2142   UROBILINOGEN 0.2 11/13/2015 1148   NITRITE NEGATIVE 05/30/2016 2142   LEUKOCYTESUR NEGATIVE 05/30/2016 2142   Sepsis Labs: @LABRCNTIP (procalcitonin:4,lacticidven:4)   Culture, blood (Routine x 2)     Status: None   Collection Time: 05/30/16  1:05 PM  Result Value Ref Range Status   Specimen Description BLOOD RIGHT ANTECUBITAL  Final   Special Requests IN PEDIATRIC BOTTLE  4CC  Final   Culture NO GROWTH 5 DAYS  Final   Report Status 06/04/2016 FINAL  Final  MRSA PCR Screening     Status: Abnormal   Collection Time: 05/30/16  6:59 PM  Result Value Ref Range Status   MRSA by PCR POSITIVE (A) NEGATIVE Final  Culture, blood (Routine x 2)     Status: None   Collection Time: 05/30/16  7:29 PM  Result Value Ref Range Status   Specimen Description BLOOD LEFT HAND  Final   Special Requests IN PEDIATRIC BOTTLE 1CC  Final   Culture NO  GROWTH 5 DAYS  Final   Report Status 06/04/2016 FINAL  Final      Radiology Studies: Dg Chest Port 1 View Result Date: 06/03/2016 Stable cardiomegaly. No active disease. No evidence of pneumonia. No evidence of active CHF at this time.    US Abdomen Limited Ruq Result Date: 06/04/2016 Minimal gallbladder sludge.  Gallbladder otherwise unremarkable. Right-sided pleural effusion.     Scheduled Meds: . carvedilol  3.125 mg Oral BID WC  . clopidogrel  75 mg Oral Daily  . furosemide  80 mg Oral Daily  . hydrALAZINE  25 mg Oral Q8H  . insulin aspart  0-15 Units Subcutaneous TID WC  . insulin glargine  20 Units Subcutaneous Q2200  . isosorbide mononitrate  15 mg Oral Daily  . pantoprazole  40 mg Oral Q1200  . rivaroxaban  20 mg Oral Q supper   Continuous Infusions:   LOS: 7 days    Time spent: 25 minutes  Greater than 50% of the time spent on counseling and coordinating the care.   Manson Passey, MD [facility] Pager [phone]  If 7PM-7AM, please contact night-coverage [email or web address] Password [email or web address] 06/06/2016, 2:06 PM

## 2016-06-06 NOTE — Consult Note (Signed)
Cardiology Consult    Patient ID: Leroy [name] MRN: [id number], DOB/AGE: 1945/07/17   Admit date: 05/30/2016 Date of Consult: 06/06/2016  Primary Physician: [name], MD Reason for Consult: CHF and syncope Primary Cardiologist: Dr. Percival Spanish Requesting Provider: Dr. Erlinda Hong   History of Present Illness    Leroy [name] is a 71 y.o. male with past medical history significant for CKD, atrail flutter previously anticoagualted with Xarelto but not taking due to cost, DM, bilateral BKA, ischemic cardiomyopathy with EF 10-15% per echo in 02/2016, CAD s/p CABG in 2001 in [place], NSTEMI 02/2016 who was found unresponsive by roommate. ABG showed acute hypercarbic respiratory acidosis. CXR showed pulmonary edema. He was admitted with acute encephalopathy. He was given Narcan with immediate response although his UDS was negative for narcotics.   He had a NSTEMI in 02/2016 and a cath showed occluded LIMA graft to LAD, the native LAD was also occluded at the ostium. The native LAD was opened with balloon angioplasty. Patent vein graft to several OM branches. The native RCA is occluded and the vein graft to the RCA is occluded (known from last cath in 2011). At that time his EF was found to be decreased to 10-15%, from EF 25-30% in 05/2015.  He has been aggressively diuresed with lasix 80 mg IV TID and was on BiPap therapy for a time. His weight is down over 40 pounds and he has diuresed over 20 liters. The patient's mental status has improved and he is aware of his circumstances.   At present he denies that he takes a diuretic at home. He also is unaware that he had so much edema although he was unable to wear his prostheses due to leg edema. At his last office visit on 03/22/2016 his weight was 277 lbs. On admission his 05/30/2016 his weight was 288 and is down to 239 today (up 6 lbs from yesterday).     Past Medical History   Past Medical History:  Diagnosis Date  . Acute respiratory failure  with hypoxia and hypercapnia ([place]) 05/31/2016  . Atrial flutter ([place])   . Cataract   . Chronic kidney disease    on Lisinopril to protect kidneys d/t being on Metformin per pt  . Chronic systolic CHF (congestive heart failure) ([place])   . Coronary artery disease    a. s/p remote CABG 2001 at [facility] b. Cath 03/2010: for med rx.;  c.  Carlton Adam myoview (1/14):  Large Inferior apical MI, very small area of peri-infarct ischemia toward the inferior apical segment. EF 19%.  Med Rx was continued.    . Diabetes mellitus 1979   takes Metformni daily  and Lantus 50units bid  . DVT (deep venous thrombosis) ([place]) 2011   legs  . H/O hiatal hernia   . H/O medication noncompliance    Due to insurance issues  . History of blood clots    in legs--this was in 2011--has been off of Coumadin 38month;takes Xarelto daily  . Hyperlipidemia   . Hypotension   . Ischemic cardiomyopathy    a. EF 40% 2010. b. Echo (2/14):  mild LVH, EF 30-35%, diff HK, Gr 1 DD, MAC, mild MR, mod LAE, PASP 39  . Joint pain   . Myocardial infarction    total of 8 heart attacks;last one in 2011  . Paroxysmal atrial fibrillation ([facility])   . Peripheral neuropathy ([place]   . Peripheral vascular disease ([place]    a. s/p L BKA 2014.  .[name]  Shortness of breath dyspnea   . Stroke [facility])     Past Surgical History:  Procedure Laterality Date  . ABDOMINAL AORTAGRAM  04-30-12  . ABDOMINAL AORTAGRAM N/A 04/30/2012   Procedure: ABDOMINAL Maxcine Ham;  Surgeon: Conrad [place] Oak, MD;  Location: [facility] CATH LAB;  Service: Cardiovascular;  Laterality: N/A;  . ADENOIDECTOMY    . AMPUTATION Left 06/03/2012   Procedure: AMPUTATION BELOW KNEE;  Surgeon: Conrad Battle Creek, MD;  Location: [place];  Service: Vascular;  Laterality: Left;  . AMPUTATION Right 04/14/2013   Procedure: AMPUTATION BELOW KNEE ;  Surgeon: Conrad [place] Victory, MD;  Location: [place];  Service: Vascular;  Laterality: Right;  . APPLICATION OF WOUND VAC Right 04/20/2014   Procedure: APPLICATION OF WOUND VAC;  Surgeon:  Conrad Portage Creek, MD;  Location: [place];  Service: Vascular;  Laterality: Right;  . CARDIAC CATHETERIZATION  03/19/10  . CARDIAC CATHETERIZATION N/A 02/22/2016   Procedure: Left Heart Cath and Cors/Grafts Angiography;  Surgeon: Jettie Booze, MD;  Location: [place] CV LAB;  Service: Cardiovascular;  Laterality: N/A;  . CARDIAC CATHETERIZATION N/A 02/22/2016   Procedure: Coronary Stent Intervention;  Surgeon: Jettie Booze, MD;  Location: [place] CV LAB;  Service: Cardiovascular;  Laterality: N/A;  . CARDIAC CATHETERIZATION    . COLONOSCOPY WITH PROPOFOL N/A 10/24/2015   Procedure: COLONOSCOPY WITH PROPOFOL;  Surgeon: Milus Banister, MD;  Location: [facility] ENDOSCOPY;  Service: Endoscopy;  Laterality: N/A;  . CORONARY ANGIOPLASTY  2002   3 stents  . CORONARY ARTERY BYPASS GRAFT  03/2000   quadruple  . ENDARTERECTOMY FEMORAL Right 03/25/2013   Procedure: ENDARTERECTOMY FEMORAL ;  Surgeon: Conrad Waldo, MD;  Location: [place];  Service: Vascular;  Laterality: Right;  . EYE SURGERY Right    Catarct  . HERNIA REPAIR     Hiatal Hernia  . I&D EXTREMITY Right 04/14/2013   Procedure: IRRIGATION AND DEBRIDEMENT OF RIGHT  GROIN & PLACEMENT OF VAC DRESSING;  Surgeon: Conrad Ossian, MD;  Location: [place];  Service: Vascular;  Laterality: Right;  . I&D EXTREMITY Right 04/20/2014   Procedure: DEBRIDEMENT OF RIGHT BELOW KNEE AMPUTATION STUMP;  Surgeon: Conrad Wheaton, MD;  Location: [place];  Service: Vascular;  Laterality: Right;  . LOWER EXTREMITY ANGIOGRAM Bilateral 04/30/2012   Procedure: LOWER EXTREMITY ANGIOGRAM;  Surgeon: Conrad [place], MD;  Location: [facility] CATH LAB;  Service: Cardiovascular;  Laterality: Bilateral;  bilat lower extrem angio  . PATCH ANGIOPLASTY Right 03/25/2013   Procedure: PATCH ANGIOPLASTY USING VASCU-GUARD PERIPHERAL VASCULAR PATCH;  Surgeon: Conrad Aud[place] Park, MD;  Location: [place];  Service: Vascular;  Laterality: Right;  . revasculariztion  2011/2010   x 2   . TONSILLECTOMY    . WOUND  DEBRIDEMENT Right 04/20/2014   s/p  rt bka  with application of wound vac     Allergies  Allergies  Allergen Reactions  . Statins Other (See Comments)    Reaction:  Muscle pain   . Amlodipine Besylate Other (See Comments)    Reaction:  Muscle pain   . Cephalexin Hives    Inpatient Medications    . carvedilol  3.125 mg Oral BID WC  . clopidogrel  75 mg Oral Daily  . furosemide  80 mg Oral Daily  . hydrALAZINE  12.5 mg Oral Q8H  . insulin aspart  0-15 Units Subcutaneous TID WC  . insulin glargine  20 Units Subcutaneous Q2200  . isosorbide mononitrate  15 mg Oral Daily  .  pantoprazole  40 mg Oral Q1200  . rivaroxaban  20 mg Oral Q supper    Family History    Family History  Problem Relation Age of Onset  . Heart disease Mother     before age 17  . Diabetes Mother   . Heart attack Mother   . Stroke Mother   . Heart disease Father   . Colon cancer Neg Hx   . Stomach cancer Neg Hx   . Pancreatic cancer Neg Hx   . Esophageal cancer Neg Hx     Social History    Social History   Social History  . Marital status: Legally Separated    Spouse name: N/A  . Number of children: N/A  . Years of education: N/A   Occupational History  . Not on file.   Social History Main Topics  . Smoking status: Former Smoker    Types: Cigarettes  . Smokeless tobacco: Never Used     Comment: quit smoking 42yr ago  . Alcohol use No  . Drug use: No  . Sexual activity: Not Currently   Other Topics Concern  . Not on file   Social History Narrative  . No narrative on file     Review of Systems   General:  No chills, fever, night sweats or weight changes.  Cardiovascular:  No chest pain, dyspnea on exertion, edema, orthopnea, palpitations, paroxysmal nocturnal dyspnea. Dermatological: No rash, lesions/masses Respiratory: No cough, dyspnea Urologic: No hematuria, dysuria Abdominal:   No nausea, vomiting, diarrhea, bright red blood per rectum, melena, or  hematemesis Neurologic:  No visual changes, wkns, changes in mental status. All other systems reviewed and are otherwise negative except as noted above.  Physical Exam   Blood pressure 104/62, pulse 82, temperature 97.8 F (36.6 C), temperature source Oral, resp. rate 20, height _0  (1.753 m), weight 239 lb 4.8 oz (108.5 kg), SpO2 94 %.   General: Pleasant, NAD, poor dentition Psych: Normal affect. Neuro: Alert and oriented X 3. Moves all extremities spontaneously. HEENT: Normal  Neck: Supple without bruits or JVD. Lungs:  Resp regular and unlabored, decreased breath sounds Heart:  rhythm,   no murmurs. Abdomen: Soft, non-tender, non-distended, BS + x 4.  Extremities: Bilateral BKA. Trace bilateral stump edema.   Labs    Troponin (Point of Care Test) No results for input(s): TROPIPOC in the last 72 hours. No results for input(s): CKTOTAL, CKMB, TROPONINI in the last 72 hours. Lab Results  Component Value Date   WBC 7.7 06/05/2016   HGB 13.3 06/05/2016   HCT 41.7 06/05/2016   MCV 88.0 06/05/2016   PLT 185 06/05/2016    Recent Labs Lab 06/04/16 0508  06/06/16 0414  NA 137  < > 138  K 3.9  < > 3.6  CL 94*  < > 96*  CO2 32  < > 34*  BUN 11  < > 13  CREATININE 1.56*  < > 1.60*  CALCIUM 8.5*  < > 8.7*  PROT 6.5  --   --   BILITOT 1.5*  --   --   ALKPHOS 180*  --   --   ALT 29  --   --   AST 32  --   --   GLUCOSE 156*  < > 162*  < > = values in this interval not displayed. Lab Results  Component Value Date   CHOL 209 (H) 05/23/2015   HDL 45 05/23/2015   LDLCALC 142 (H) 05/23/2015  TRIG 108 05/23/2015   No results found for: [facility]   Radiology Studies    Ct Head Wo Contrast  Result Date: 05/30/2016 CLINICAL DATA:  Altered mental status. EXAM: CT HEAD WITHOUT CONTRAST TECHNIQUE: Contiguous axial images were obtained from the base of the skull through the vertex without intravenous contrast. COMPARISON:  Brain MRI 05/23/2015 FINDINGS: Brain: No acute  intracranial hemorrhage. No focal mass lesion. No CT evidence of acute infarction. No midline shift or mass effect. No hydrocephalus. Basilar cisterns are patent. Remote infarction LEFT cerebellum. Generalized cortical atrophy. Periventricular white matter hypodensities. Vascular: No hyperdense vessel or unexpected calcification. Skull: Normal. Negative for fracture or focal lesion. Sinuses/Orbits: Paranasal sinuses and mastoid air cells are clear. Orbits are clear. Other: Subcutaneous lesion LEFT occipital region unchanged (Image 7, series 3). IMPRESSION: 1. No acute intracranial findings.  \ 2. White matter microvascular disease and atrophy. 3. Remote LEFT cerebellar infarction. Electronically Signed   By: Suzy Bouchard M.D.   On: 05/30/2016 19:03   Dg Chest Port 1 View  Result Date: 06/03/2016 CLINICAL DATA:  Atrial fibrillation and CHF. EXAM: PORTABLE CHEST 1 VIEW COMPARISON:  Chest x-rays dated 05/31/2016, 05/30/2016 and 03/25/2016. FINDINGS: Stable cardiomegaly. Median sternotomy wires appear grossly intact and stable in alignment. Lungs are presently clear. No pleural effusion or pneumothorax seen. No acute or suspicious osseous finding. IMPRESSION: Stable cardiomegaly. No active disease. No evidence of pneumonia. No evidence of active CHF at this time. Electronically Signed   By: Franki Cabot M.D.   On: 06/03/2016 18:19   Dg Chest Port 1 View  Result Date: 05/31/2016 CLINICAL DATA:  Cough and wheezing EXAM: PORTABLE CHEST 1 VIEW COMPARISON:  May 30, 2016 FINDINGS: There is persistent cardiomegaly with pulmonary venous hypertension. Currently there is no appreciable edema or consolidation. No pleural effusions evident. No adenopathy. Patient is status post median sternotomy. No bone lesions. IMPRESSION: Pulmonary vascular congestion without frank edema or consolidation. Stable cardiac enlargement. Electronically Signed   By: Lowella Grip III M.D.   On: 05/31/2016 07:32   Dg Chest  Portable 1 View  Result Date: 05/30/2016 CLINICAL DATA:  Short of breath EXAM: PORTABLE CHEST 1 VIEW COMPARISON:  03/25/2016 FINDINGS: Postop CABG. Cardiac enlargement. Vascular congestion with mild edema. Small bilateral effusions bibasilar atelectasis. IMPRESSION: Congestive heart failure with mild edema and small bilateral pleural effusions Electronically Signed   By: Franchot Gallo M.D.   On: 05/30/2016 13:32   US Abdomen Limited Ruq  Result Date: 06/04/2016 CLINICAL DATA:  71 year old male with elevated LFTs. Initial encounter. EXAM: US ABDOMEN LIMITED - RIGHT UPPER QUADRANT COMPARISON:  10/22/2015 CT. FINDINGS: Gallbladder: No gallstones. Minimal sludge. No gallbladder wall thickening or pericholecystic fluid. Patient not tender over this region during scanning per ultrasound technologist. Common bile duct: Diameter: 3.6 mm. Liver: No focal lesion identified. Within normal limits in parenchymal echogenicity. Right-sided pleural effusion. IMPRESSION: Minimal gallbladder sludge.  Gallbladder otherwise unremarkable. Right-sided pleural effusion. Electronically Signed   By: Genia Del M.D.   On: 06/04/2016 09:44    EKG & Cardiac Imaging   EKG: sinus rhythm 67 bpm. Poor r wave progression  Telemetry: NSR, PVCs-personally reviewed  Echocardiogram:  06/05/16 Study Conclusions  - Left ventricle: The cavity size was mildly dilated. Wall   thickness was normal. The estimated ejection fraction was 10%.   Diffuse hypokinesis. No apical thrombus with Definity contrast.   The study is not technically sufficient to allow evaluation of LV   diastolic function. - Mitral valve: Mildly  thickened leaflets . There was mild   regurgitation. - Left atrium: Severely dilated. - Right ventricle: The cavity size was moderately dilated.   Moderately reduced systolic function. - Right atrium: The atrium was normal in size. - Tricuspid valve: There was moderate regurgitation. - Pulmonary arteries: PA peak  pressure: 68 mm Hg (S). - Inferior vena cava: The vessel was normal in size. The   respirophasic diameter changes were in the normal range (>= 50%),   consistent with normal central venous pressure.  Impressions:  - Compared to a prior study in 01/2016, there has been no   significant change.   Assessment & Plan    Acute on Chronic systolic heart failure -EF in 02/2016 was 10-15%-no change this adm.  -Metoprolol has been switched to coreg. No ACE-I/ARB due to low BP and elevated SCr -Was diuresed with lasix 80 mg TID, now on Lasix 80 mg PO daily. Wt down from 288 lbs to 239 lbs. I&O net negative 22L since admission.  -Breathing is much better. He is able to lie flat to sleep and edema is greatly improved.  -Will consider EP consult for ICD is indicated-may want to ask Dr Percival Spanish about this-? Is he a candidate with history of compliance issues. -Pt denied taking diuretics prior to admission. It is unclear whether he knows what he takes or he is noncompliant. He will need to be on diuretic and should weigh daily.  Syncope -With severe diastolic dysfunction this could have been related to arrhythmia, but no arrhythmias noted since admission and he responded to Narcan in the ED. He has maintained sinus rhythm with PVC's.   Respiratory failure -Pt hypoxic and hypercapneic on admission. Improved with Narcan. Now diuresed over 50 pounds of fluid. Breathing and edema better.   CAD -S/P CABG 2001. NSTEMI and occluded LIMA to LAD- PTCA to LAD in 02/2016 with only modest improvement. He has known Occlusion of the native RCA and occlusion of the SVG to RCA. -Medical therapy includes Plavix, beta blocker, statin -Troponins 0.04, 0.05. Seems to be chronically mildly elevated (0.09 in 03/2016) -No chest pain at time of presentation or since then -Continue beta blocker, Plavix, statin. ARB is on hold due to renal function.   Paroxysmal atrial fibrillation -Maintaining sinus rhythm on  metoprolol -CHA2DS2-VASc Score is 6 (CHF, age, stroke (2), DM, Vascular disease). He takes Xarelto for stroke risk reduction  CKD stage 3 -SCr 1.56 today. This is around his baseline. -Avoid nephrotoxic agents. He is off his ARB  OSA -Pt has been told that he has sleep apnea, but he has refused CPAP. I discussed the benefits of treating sleep apnea for heart health, but he currently does not want to pursue this.   DM -Followed by IM. S/P bilat BKA  Plan:  B/P better today on decreased diuretic. He appears to be close his baseline.   Angelena Form, PA-C 06/06/2016, 12:55 PM Pager: [phone]  Patient seen and examined. Agree with assessment and plan. Breathing better; now on reduced diuretic with addition of nitrates/hydralazine. EF 10% on echo yesterday. Cr 1.60 today. Will increase hydralazine to 25 mg q 8 hrs today and tomorrow increase nitrates; ultimately plan to increase to Bidil dosing    Troy Sine, MD, [facility] 06/06/2016 1:17 PM

## 2016-06-07 DIAGNOSIS — I5043 Acute on chronic combined systolic (congestive) and diastolic (congestive) heart failure: Secondary | ICD-10-CM

## 2016-06-07 DIAGNOSIS — I480 Paroxysmal atrial fibrillation: Secondary | ICD-10-CM

## 2016-06-07 LAB — GLUCOSE, CAPILLARY
GLUCOSE-CAPILLARY: 179 mg/dL — AB (ref 65–99)
GLUCOSE-CAPILLARY: 178 mg/dL — AB (ref 65–99)
GLUCOSE-CAPILLARY: 184 mg/dL — AB (ref 65–99)
Glucose-Capillary: 175 mg/dL — ABNORMAL HIGH (ref 65–99)

## 2016-06-07 LAB — BASIC METABOLIC PANEL
Anion gap: 12 (ref 5–15)
BUN: 14 mg/dL (ref 6–20)
CHLORIDE: 92 mmol/L — AB (ref 101–111)
CO2: 30 mmol/L (ref 22–32)
Calcium: 8.6 mg/dL — ABNORMAL LOW (ref 8.9–10.3)
Creatinine, Ser: 1.64 mg/dL — ABNORMAL HIGH (ref 0.61–1.24)
GFR calc non Af Amer: 41 mL/min — ABNORMAL LOW (ref >60)
GFR, EST AFRICAN AMERICAN: 48 mL/min — AB (ref >60)
Glucose, Bld: 295 mg/dL — ABNORMAL HIGH (ref 65–99)
POTASSIUM: 3.9 mmol/L (ref 3.5–5.1)
SODIUM: 134 mmol/L — AB (ref 135–145)

## 2016-06-07 LAB — CBC
HCT: 39.6 % (ref 39.0–52.0)
HEMOGLOBIN: 12.5 g/dL — AB (ref 13.0–17.0)
MCH: 27.5 pg (ref 26.0–34.0)
MCHC: 31.6 g/dL (ref 30.0–36.0)
MCV: 87.2 fL (ref 78.0–100.0)
Platelets: 189 10*3/uL (ref 150–400)
RBC: 4.54 MIL/uL (ref 4.22–5.81)
RDW: 15.8 % — ABNORMAL HIGH (ref 11.5–15.5)
WBC: 8.2 10*3/uL (ref 4.0–10.5)

## 2016-06-07 LAB — BRAIN NATRIURETIC PEPTIDE: B NATRIURETIC PEPTIDE 5: 457.2 pg/mL — AB (ref 0.0–100.0)

## 2016-06-07 MED ORDER — HYDRALAZINE HCL 25 MG PO TABS
37.5000 mg | ORAL_TABLET | Freq: Three times a day (TID) | ORAL | Status: DC
  Administered 2016-06-07 – 2016-06-09 (×8): 37.5 mg via ORAL
  Administered 2016-06-10: 3.75 mg via ORAL
  Administered 2016-06-10 – 2016-06-11 (×3): 37.5 mg via ORAL
  Filled 2016-06-07 (×13): qty 2

## 2016-06-07 MED ORDER — FUROSEMIDE 40 MG PO TABS
40.0000 mg | ORAL_TABLET | Freq: Every day | ORAL | Status: DC
  Administered 2016-06-08 – 2016-06-11 (×4): 40 mg via ORAL
  Filled 2016-06-07 (×4): qty 1

## 2016-06-07 MED ORDER — ISOSORBIDE MONONITRATE ER 30 MG PO TB24
30.0000 mg | ORAL_TABLET | Freq: Every day | ORAL | Status: DC
  Administered 2016-06-08 – 2016-06-11 (×4): 30 mg via ORAL
  Filled 2016-06-07 (×4): qty 1

## 2016-06-07 NOTE — Progress Notes (Signed)
Pt pulled off Saline lock despite instructions not to remove IV. Will notify IV team to reinsert IV saline lock Pt hard stick

## 2016-06-07 NOTE — Progress Notes (Addendum)
Patient ID: Leroy [name], male   DOB: June 09, 1945, 71 y.o.   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 Patient also has bilateral BKA.  Patient presented to ED 05/30/2016 with altered mental status, found by his roommate unresponsive, EMS was called and patient was placed on NIPPV. Patient was seen by critical care on the admission, Narcan was administered with almost immediate improvement in mental status. Altered mental status thought to be secondary to narcotic-induced encephalopathy and hypercarbia. Patient was seen by cardiology in consultation for acute on chronic systolic heart failure.    Assessment & Plan:   Acute encephalopathy, likely drug-induced - Thought to be secondary to narcotics this patient's mental status improved almost immediately with Narcan - CT head showed remote infarct but no acute findings - EEG did not show seizure activity; there was diffuse slowing but nonspecific   Acute on chronic systolic heart failure / acute respiratory failure with hypoxia and hypercapnia - Ejection fraction in November 2017 was 10-15% - Echo on this admission with EF 10% - Patient was diuresed with Lasix 80 mg 3 times daily with significant weight down so currently Lasix is 80 mg daily - Cardio decreased lasix further to 40 mg a day this am - Increased hydralazine to 37.5 mg every 8 hours and increased Imdur to 30 mg a day  - Continue daily weight and strict intake and output - Appreciate  cardiology following   CAD status post CABG 2001 / NSTEMI and occluded LIMA to LAD- PTCA to LAD in 02/2016  - Troponin 0.04, 0.05, seems to be chronically mildly elevated, 0.09 in December 2017 - Continue beta blocker, Plavix, statin - ARB on hold due to renal insufficiency   Paroxysmal atrial fibrillation - CHADS vasc score 6 - Continue anticoagulation with xarelto and Plavix - Rate controlled with Coreg  Chronic kidney disease stage III - Baseline creatinine 1.7 in January 2014 - Creatinine within baseline range  Hypokalemia - Due to Lasix - Supplemented  Essential hypertension - Continue carvedilol, hydralazine, isosorbide  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   Objective: Vitals:   06/06/16 2022 06/07/16 0523 06/07/16 0800 06/07/16 1100  BP: (!) 133/44 (!) 122/42 (!) 135/103 106/88  Pulse: 95 97 93 83  Resp: 18 18  18   Temp: 98.6 F (37 C) 98.1 F (36.7 C)  98 F (36.7 C)  TempSrc: Oral Oral  Oral  SpO2: 96% 98% (!) 82% 100%  Weight:  234 lb 3.2 oz (106.2 kg)    Height:        Intake/Output Summary (Last 24 hours) at 06/07/16 1231 Last data filed at 06/07/16 0946  Gross per 24 hour  Intake              840 ml  Output             2640 ml  Net            -1800 ml   Filed Weights   06/05/16 0534 06/06/16 0624 06/07/16 0523  Weight: 233 lb 3.2 oz (105.8 kg) 239 lb 4.8 oz (108.5 kg)  234 lb 3.2 oz (106.2 kg)    Examination:  General exam: , no distress  Respiratory system: No wheezing, no rhonchi  Cardiovascular system: S1 & S2 heard, rate controlled  Gastrointestinal system: (+) BS, non tender, non distended  Central nervous system: Nonfocal  Extremities: bilateral BKA Skin: warm, dry, no lesions  Psychiatry: Normal mood and behavior, no agitation or restlessness   Data Reviewed: I have personally reviewed following labs and imaging studies  CBC:  Recent Labs Lab 06/02/16 0522 06/03/16 0453 06/04/16 0508 06/05/16 0309 06/07/16 0357  WBC 6.9 6.1 6.8 7.7 8.2  HGB 11.9* 13.0 13.5 13.3 12.5*  HCT 38.5* 42.2 42.7 41.7 39.6  MCV 89.3 89.8 88.6 88.0 87.2  PLT 180 178 194 185 189   Basic Metabolic Panel:  Recent Labs Lab 06/01/16 0623 06/02/16 0522 06/03/16 0453 06/04/16 0508 06/05/16 0309 06/06/16 0414 06/07/16 0357  NA 141 140 140 137 137 138 134*  K 3.9 4.6 3.3* 3.9 3.3* 3.6 3.9  CL 103 96* 94* 94* 92* 96* 92*  CO2 28 35* 36* 32 35* 34* 30  GLUCOSE 107* 158* 200* 156* 168* 162* 295*  BUN 9 12 11 11 13 13 14   CREATININE 1.57* 1.86* 1.63* 1.56* 1.55* 1.60* 1.64*  CALCIUM 8.4* 8.4* 8.5* 8.5* 8.9 8.7* 8.6*  MG 1.7 1.9 1.7  --   --   --   --   PHOS 3.3 3.4 3.1  --   --   --   --    GFR: Estimated Creatinine Clearance: 51 mL/min (by C-G formula based on SCr of 1.64 mg/dL (H)). Liver Function Tests:  Recent Labs Lab 06/02/16 0522 06/04/16 0508  AST 44* 32  ALT 29 29  ALKPHOS 195* 180*  BILITOT 1.2 1.5*  PROT 5.7* 6.5  ALBUMIN 2.5* 2.7*   No results for input(s): LIPASE, AMYLASE in the last 168 hours. No results for input(s): AMMONIA in the last 168 hours. Coagulation Profile: No results for input(s): INR, PROTIME in the last 168 hours. Cardiac Enzymes:  Recent Labs Lab 06/01/16 0623  TROPONINI 0.05*   BNP (last 3 results) No results for input(s): PROBNP in the last 8760 hours. HbA1C: No results for input(s): HGBA1C in the  last 72 hours. CBG:  Recent Labs Lab 06/06/16 1113 06/06/16 1642 06/06/16 2119 06/07/16 0814 06/07/16 1127  GLUCAP 305* 141* 168* 179* 178*   Lipid Profile: No results for input(s): CHOL, HDL, LDLCALC, TRIG, CHOLHDL, LDLDIRECT in the last 72 hours. Thyroid Function Tests: No results for input(s): TSH, T4TOTAL, FREET4, T3FREE, THYROIDAB in the last 72 hours. Anemia Panel: No results for input(s): VITAMINB12, FOLATE, FERRITIN, TIBC, IRON, RETICCTPCT in the last 72 hours. Urine analysis:    Component Value Date/Time   COLORURINE YELLOW 05/30/2016 2142   APPEARANCEUR HAZY (A)  05/30/2016 2142   LABSPEC 1.014 05/30/2016 2142   PHURINE 5.0 05/30/2016 2142   GLUCOSEU NEGATIVE 05/30/2016 2142   GLUCOSEU NEGATIVE 11/13/2015 1148   HGBUR NEGATIVE 05/30/2016 2142   BILIRUBINUR NEGATIVE 05/30/2016 2142   KETONESUR NEGATIVE 05/30/2016 2142   PROTEINUR 100 (A) 05/30/2016 2142   UROBILINOGEN 0.2 11/13/2015 1148   NITRITE NEGATIVE 05/30/2016 2142   LEUKOCYTESUR NEGATIVE 05/30/2016 2142   Sepsis Labs: @LABRCNTIP (procalcitonin:4,lacticidven:4)   Culture, blood (Routine x 2)     Status: None   Collection Time: 05/30/16  1:05 PM  Result Value Ref Range Status   Specimen Description BLOOD RIGHT ANTECUBITAL  Final   Special Requests IN PEDIATRIC BOTTLE  4CC  Final   Culture NO GROWTH 5 DAYS  Final   Report Status 06/04/2016 FINAL  Final  MRSA PCR Screening     Status: Abnormal   Collection Time: 05/30/16  6:59 PM  Result Value Ref Range Status   MRSA by PCR POSITIVE (A) NEGATIVE Final  Culture, blood (Routine x 2)     Status: None   Collection Time: 05/30/16  7:29 PM  Result Value Ref Range Status   Specimen Description BLOOD LEFT HAND  Final   Special Requests IN PEDIATRIC BOTTLE 1CC  Final   Culture NO GROWTH 5 DAYS  Final   Report Status 06/04/2016 FINAL  Final      Radiology Studies: Dg Chest Port 1 View Result Date: 06/03/2016 Stable cardiomegaly. No active disease. No  evidence of pneumonia. No evidence of active CHF at this time.    US Abdomen Limited Ruq Result Date: 06/04/2016 Minimal gallbladder sludge.  Gallbladder otherwise unremarkable. Right-sided pleural effusion.     Scheduled Meds: . carvedilol  3.125 mg Oral BID WC  . clopidogrel  75 mg Oral Daily  . furosemide  80 mg Oral Daily  . hydrALAZINE  25 mg Oral Q8H  . insulin aspart  0-15 Units Subcutaneous TID WC  . insulin aspart  3 Units Subcutaneous TID WC  . insulin glargine  25 Units Subcutaneous Q2200  . isosorbide mononitrate  15 mg Oral Daily  . pantoprazole  40 mg Oral Q1200  . rivaroxaban  20 mg Oral Q supper   Continuous Infusions:   LOS: 8 days    Time spent: 15 minutes  Greater than 50% of the time spent on counseling and coordinating the care.   Manson Passey, MD [facility] Pager [phone]  If 7PM-7AM, please contact night-coverage [email or web address] Password [facility] 06/07/2016, 12:31 PM

## 2016-06-07 NOTE — Progress Notes (Signed)
Physical Therapy Treatment Patient Details Name: Leroy [name] MRN: [id number] DOB: September 18, 1945 Today's Date: 06/07/2016    History of Present Illness pt presents with Respiratory Failure, HF, and AMS.  pt with hx of DM, HF, CAD, CABG, CKD, Bil BKAs, DVT, MI, Neuropathy, PVD, and CVA.      PT Comments    Pt needing repeated encouragement throughout session to participate with mobility. Initially pt providing minimal assistance but eventually willing to transfer from bed to chair using lateral scoot technique. Pt demonstrates good UE strength in bed and in chair (lifting hips up from chair for positioning and pulling self up in bed) but functionally not utilizing observed strength for transfer. PT continuing to recommend SNF following acute stay.    Follow Up Recommendations  SNF     Equipment Recommendations  None recommended by PT    Recommendations for Other Services       Precautions / Restrictions Precautions Precautions: Fall Restrictions Weight Bearing Restrictions: No    Mobility  Bed Mobility Overal bed mobility: Needs Assistance Bed Mobility: Supine to Sit     Supine to sit: Mod assist     General bed mobility comments: Mod assist to assist to long sitting in bed X2. Pt then able to use UEs to pull self up in bed. Heavy encouragment needed for participation  Transfers Overall transfer level: Needs assistance Equipment used: None Transfers: Lateral/Scoot Transfers          Lateral/Scoot Transfers: Mod assist General transfer comment: Drop arm recliner used for lateral scoot transfer, pillow placed for level return. Assist provided with bed bad to move hips. Heavy encouragement needed for participation.   Ambulation/Gait                 Stairs            Wheelchair Mobility    Modified Rankin (Stroke Patients Only)       Balance Overall balance assessment: Needs assistance Sitting-balance support: No upper extremity  supported Sitting balance-Leahy Scale: Good Sitting balance - Comments: good stability when sitting in bed.                             Cognition Arousal/Alertness:  (variable lethargic/alert ) Behavior During Therapy: Flat affect Overall Cognitive Status: No family/caregiver present to determine baseline cognitive functioning                 General Comments: Pt slow to respond to questions.    Exercises      General Comments General comments (skin integrity, edema, etc.): Repeated and consistent encouragement needed for pt to participate.       Pertinent Vitals/Pain Pain Assessment: No/denies pain    Home Living                      Prior Function            PT Goals (current goals can now be found in the care plan section) Acute Rehab PT Goals Patient Stated Goal: none expressed PT Goal Formulation: With patient Time For Goal Achievement: 06/18/16 Potential to Achieve Goals: Good Progress towards PT goals:  (Decreased mobility during current session. )    Frequency    Min 2X/week      PT Plan Current plan remains appropriate    Co-evaluation             End of Session Equipment Utilized During  Treatment: Oxygen Activity Tolerance: Patient tolerated treatment well Patient left: in chair;with call bell/phone within reach;with chair alarm set Nurse Communication: Mobility status (discussed lateral scoot technique) PT Visit Diagnosis: Muscle weakness (generalized) (M62.81)     Time: 6269-4854 PT Time Calculation (min) (ACUTE ONLY): 37 min  Charges:  $Therapeutic Activity: 23-37 mins                    G Codes:       Christiane Ha, PT, CSCS Pager [phone] Office [phone]  06/07/2016, 3:36 PM

## 2016-06-07 NOTE — Progress Notes (Signed)
Progress Note  Patient Name: Leroy [name] Date of Encounter: 06/07/2016  Primary Cardiologist: Dr Antoine Poche  Subjective   Pt has no chest pain. He is feeling weak and was unable to stand on his prosthetics yesterday. He feels that his breathing is near back to normal. He is still requiring supplemental oxygen and says that he has oxygen at home wears it sometimes.   Inpatient Medications    Scheduled Meds: . carvedilol  3.125 mg Oral BID WC  . clopidogrel  75 mg Oral Daily  . furosemide  80 mg Oral Daily  . hydrALAZINE  25 mg Oral Q8H  . insulin aspart  0-15 Units Subcutaneous TID WC  . insulin aspart  3 Units Subcutaneous TID WC  . insulin glargine  25 Units Subcutaneous Q2200  . isosorbide mononitrate  15 mg Oral Daily  . pantoprazole  40 mg Oral Q1200  . rivaroxaban  20 mg Oral Q supper   Continuous Infusions:  PRN Meds: sodium chloride, ipratropium-albuterol   Vital Signs    Vitals:   06/06/16 1213 06/06/16 1700 06/06/16 2022 06/07/16 0523  BP: 104/62 114/78 (!) 133/44 (!) 122/42  Pulse: 82 99 95 97  Resp: 20 (!) 21 18 18   Temp: 97.8 F (36.6 C) 98.6 F (37 C) 98.6 F (37 C) 98.1 F (36.7 C)  TempSrc: Oral Oral Oral Oral  SpO2: 94% 95% 96% 98%  Weight:    234 lb 3.2 oz (106.2 kg)  Height:        Intake/Output Summary (Last 24 hours) at 06/07/16 0826 Last data filed at 06/07/16 0600  Gross per 24 hour  Intake             1200 ml  Output             2640 ml  Net            -1440 ml   Filed Weights   06/05/16 0534 06/06/16 0624 06/07/16 0523  Weight: 233 lb 3.2 oz (105.8 kg) 239 lb 4.8 oz (108.5 kg) 234 lb 3.2 oz (106.2 kg)    Telemetry   Sinus rhythm/sinus tach with rates in the 90's-low 100's, PVC's - Personally Reviewed  ECG    No new tracings.   Physical Exam   GEN: No acute distress.   Neck: No JVD Cardiac: RRR, no murmurs, rubs, or gallops.  Respiratory: Clear to auscultation bilaterally. GI: Soft, nontender, non-distended  MS: No  edema; Bilateral BKA Neuro:  Nonfocal  Psych: Normal affect   Labs    Chemistry Recent Labs Lab 06/02/16 0522  06/04/16 0508 06/05/16 0309 06/06/16 0414 06/07/16 0357  NA 140  < > 137 137 138 134*  K 4.6  < > 3.9 3.3* 3.6 3.9  CL 96*  < > 94* 92* 96* 92*  CO2 35*  < > 32 35* 34* 30  GLUCOSE 158*  < > 156* 168* 162* 295*  BUN 12  < > 11 13 13 14   CREATININE 1.86*  < > 1.56* 1.55* 1.60* 1.64*  CALCIUM 8.4*  < > 8.5* 8.9 8.7* 8.6*  PROT 5.7*  --  6.5  --   --   --   ALBUMIN 2.5*  --  2.7*  --   --   --   AST 44*  --  32  --   --   --   ALT 29  --  29  --   --   --   [facility]  195*  --  180*  --   --   --   BILITOT 1.2  --  1.5*  --   --   --   GFRNONAA 35*  < > 44* 44* 42* 41*  GFRAA 41*  < > 51* 51* 49* 48*  ANIONGAP 9  < > 11 10 8 12   < > = values in this interval not displayed.   Hematology  Recent Labs Lab 06/04/16 0508 06/05/16 0309 06/07/16 0357  WBC 6.8 7.7 8.2  RBC 4.82 4.74 4.54  HGB 13.5 13.3 12.5*  HCT 42.7 41.7 39.6  MCV 88.6 88.0 87.2  MCH 28.0 28.1 27.5  MCHC 31.6 31.9 31.6  RDW 15.7* 15.8* 15.8*  PLT 194 185 189    Cardiac Enzymes  Recent Labs Lab 06/01/16 0623  TROPONINI 0.05*   No results for input(s): TROPIPOC in the last 168 hours.   BNP  Recent Labs Lab 06/05/16 1154 06/07/16 0357  BNP 619.3* 457.2*     DDimer No results for input(s): DDIMER in the last 168 hours.   Radiology    No results found.  Cardiac Studies   Echo 06/05/16 Study Conclusions  - Left ventricle: The cavity size was mildly dilated. Wall   thickness was normal. The estimated ejection fraction was 10%.   Diffuse hypokinesis. No apical thrombus with Definity contrast.   The study is not technically sufficient to allow evaluation of LV   diastolic function. - Mitral valve: Mildly thickened leaflets . There was mild   regurgitation. - Left atrium: Severely dilated. - Right ventricle: The cavity size was moderately dilated.   Moderately reduced  systolic function. - Right atrium: The atrium was normal in size. - Tricuspid valve: There was moderate regurgitation. - Pulmonary arteries: PA peak pressure: 68 mm Hg (S). - Inferior vena cava: The vessel was normal in size. The   respirophasic diameter changes were in the normal range (>= 50%),   consistent with normal central venous pressure.  Impressions:  - Compared to a prior study in 01/2016, there has been no   significant change.  Patient Profile     71 y.o. male  with past medical history significant for CKD, atrial flutter anticoagualted with Xarelto, DM, bilateral BKA, ischemic cardiomyopathy with EF 10-15% per echo in 02/2016, CAD s/p CABG in 2001 in [place], NSTEMI 02/2016 who was found unresponsive by roommate. ABG showed acute hypercarbic respiratory acidosis. CXR showed pulmonary edema. He was admitted with acute encephalopathy.  Assessment & Plan    Acute on Chronic systolic heart failure -EF in 16/1096 was 10-15%-no change this adm.  -Metoprolol has been switched to coreg. No ACE-I/ARB due to low BP and elevated SCr -Was diuresed with lasix 80 mg TID, now on Lasix 80 mg PO daily. Wt down from 288 lbs to 234 lbs. I&O net negative 24L since admission. Continues to diureses well with oral dosing (negative 1.4L) -BNP on admission 1357, 3/2 457 -Breathing is much better. He is able to lie flat to sleep and edema is greatly improved.  -BP was low on IV diuretic. Diuretic changed to oral dosing with improved blood pressure.  -Pt denied taking diuretics prior to admission. It is unclear whether he knows what he takes or he is noncompliant. He will need to be on diuretic out patient and should weigh daily. -Repeat Echo shows EF 10% and diffuse hypokinesis -Pt appears to be back to his baseline.  -BP is better now. May be able to titrate  carvedilol. HR is around 100.  Syncope -With severe diastolic dysfunction this could have been related to arrhythmia, but no arrhythmias  noted since admission and he responded to Narcan in the ED. He has maintained sinus rhythm with PVC's.   Respiratory failure -Pt hypoxic and hypercapneic on admission. Improved with Narcan. Now diuresed over 50 pounds of fluid. Breathing and edema better.   CAD -S/P CABG 2001. NSTEMI and occluded LIMA to LAD- PTCA to LAD in 02/2016 with only modest improvement. He has known Occlusion of the native RCA and occlusion of the SVG to RCA. -Medical therapy includes Plavix, beta blocker, statin -Troponins 0.04, 0.05. Seems to be chronically mildly elevated (0.09 in 03/2016) -No chest pain at time of presentation or since then -Continue beta blocker, Plavix, statin. ARB is on hold due to renal function.   Paroxysmal atrial fibrillation -Maintaining sinus rhythm on metoprolol -CHA2DS2-VASc Score is 6 (CHF, age, stroke (2), DM, Vascular disease). He takes Xarelto for stroke risk reduction  CKD stage 3 -SCr 1.64 today. This is up a little over the last few days. With his tremendous diuresis he may be getting a little dehydrated, although BUN is normal. He may be able to take a lower dose of lasix if he will be consistent and take every day.  -Avoid nephrotoxic agents. He is off his ARB  OSA -Pt has been told that he has sleep apnea, but he has refused CPAP. I discussed the benefits of treating sleep apnea for heart health, but he currently does not want to pursue this.   DM -Followed by IM. S/P bilat BKA  Berton Bon, AGNP-C 3/2/20188:28 AM Pager: [phone]   Patient seen and examined. Agree with assessment and plan. I/O -[phone] since admission. BNP improved to 457 today, Will decrease lasix to 40 mg; further titrate imdur to 30 mg and hydralazine to 37.5 mg q 8 hrs. F/u renal fxn.    Lennette Bihari, MD, [facility] 06/07/2016 11:45 AM

## 2016-06-08 LAB — BASIC METABOLIC PANEL
ANION GAP: 9 (ref 5–15)
BUN: 14 mg/dL (ref 6–20)
CALCIUM: 8.9 mg/dL (ref 8.9–10.3)
CO2: 33 mmol/L — ABNORMAL HIGH (ref 22–32)
Chloride: 95 mmol/L — ABNORMAL LOW (ref 101–111)
Creatinine, Ser: 1.36 mg/dL — ABNORMAL HIGH (ref 0.61–1.24)
GFR calc Af Amer: 60 mL/min — ABNORMAL LOW (ref >60)
GFR, EST NON AFRICAN AMERICAN: 52 mL/min — AB (ref >60)
GLUCOSE: 131 mg/dL — AB (ref 65–99)
Potassium: 3.2 mmol/L — ABNORMAL LOW (ref 3.5–5.1)
Sodium: 137 mmol/L (ref 135–145)

## 2016-06-08 LAB — CBC
HCT: 40.1 % (ref 39.0–52.0)
Hemoglobin: 12.6 g/dL — ABNORMAL LOW (ref 13.0–17.0)
MCH: 27.5 pg (ref 26.0–34.0)
MCHC: 31.4 g/dL (ref 30.0–36.0)
MCV: 87.6 fL (ref 78.0–100.0)
PLATELETS: 189 10*3/uL (ref 150–400)
RBC: 4.58 MIL/uL (ref 4.22–5.81)
RDW: 15.9 % — AB (ref 11.5–15.5)
WBC: 7.1 10*3/uL (ref 4.0–10.5)

## 2016-06-08 LAB — GLUCOSE, CAPILLARY
GLUCOSE-CAPILLARY: 165 mg/dL — AB (ref 65–99)
Glucose-Capillary: 122 mg/dL — ABNORMAL HIGH (ref 65–99)
Glucose-Capillary: 161 mg/dL — ABNORMAL HIGH (ref 65–99)
Glucose-Capillary: 122 mg/dL — ABNORMAL HIGH (ref 65–99)

## 2016-06-08 MED ORDER — POTASSIUM CHLORIDE CRYS ER 20 MEQ PO TBCR
40.0000 meq | EXTENDED_RELEASE_TABLET | Freq: Once | ORAL | Status: AC
  Administered 2016-06-08: 40 meq via ORAL
  Filled 2016-06-08: qty 2

## 2016-06-08 MED ORDER — DOCUSATE SODIUM 100 MG PO CAPS
100.0000 mg | ORAL_CAPSULE | Freq: Two times a day (BID) | ORAL | Status: DC
  Administered 2016-06-08 – 2016-06-11 (×6): 100 mg via ORAL
  Filled 2016-06-08 (×7): qty 1

## 2016-06-08 NOTE — Progress Notes (Signed)
Progress Note  Patient Name: Leroy [name] Date of Encounter: 06/08/2016  Primary Cardiologist: Dr Antoine Poche  Subjective  71 yo, hx of Isch cardiomyopathy. Was found down by roommate and admitted on Feb. 22.  Has a history of atrial flutter. Has a history of coronary artery disease and is status post coronary artery bypass graft in 2001 while living in [place].  Hx of systolic chf in the past - EF has been 30-35%  Echo 2/28 shows EF 10%.   Mild MR ,moderate - severe pulmonary HTN with estimated pulmonary artery pressure of 68 mmHg.  Pt has no chest pain. He is feeling weak and was unable to stand on his prosthetics yesterday. He feels that his breathing is near back to normal. He is still requiring supplemental oxygen and says that he has oxygen at home wears it sometimes.   Inpatient Medications    Scheduled Meds: . carvedilol  3.125 mg Oral BID WC  . clopidogrel  75 mg Oral Daily  . docusate sodium  100 mg Oral BID  . furosemide  40 mg Oral Daily  . hydrALAZINE  37.5 mg Oral Q8H  . insulin aspart  0-15 Units Subcutaneous TID WC  . insulin aspart  3 Units Subcutaneous TID WC  . insulin glargine  25 Units Subcutaneous Q2200  . isosorbide mononitrate  30 mg Oral Daily  . pantoprazole  40 mg Oral Q1200  . rivaroxaban  20 mg Oral Q supper   Continuous Infusions:  PRN Meds: sodium chloride, ipratropium-albuterol   Vital Signs    Vitals:   06/07/16 2011 06/07/16 2047 06/08/16 0417 06/08/16 0653  BP: (!) 98/53 (!) 111/53 130/86 115/76  Pulse: 99 94 99 90  Resp: 18  18   Temp: 98.6 F (37 C)  98.2 F (36.8 C)   TempSrc: Oral  Oral   SpO2: 96%  96%   Weight:   234 lb 3.2 oz (106.2 kg)   Height:        Intake/Output Summary (Last 24 hours) at 06/08/16 1231 Last data filed at 06/07/16 2235  Gross per 24 hour  Intake                0 ml  Output              900 ml  Net             -900 ml   Filed Weights   06/06/16 0624 06/07/16 0523 06/08/16 0417  Weight: 239  lb 4.8 oz (108.5 kg) 234 lb 3.2 oz (106.2 kg) 234 lb 3.2 oz (106.2 kg)    Telemetry   Sinus rhythm/sinus tach with rates in the 90's-low 100's, PVC's - Personally Reviewed  ECG    No new tracings.   Physical Exam   GEN: No acute distress.   Neck: No JVD Cardiac: RRR, no murmurs, rubs, or gallops.  Respiratory: Clear to auscultation bilaterally. GI: Soft, nontender, non-distended  MS: No edema; Bilateral BKA Neuro:  Nonfocal  Psych: Normal affect   Labs    Chemistry Recent Labs Lab 06/02/16 0522  06/04/16 0508  06/06/16 0414 06/07/16 0357 06/08/16 0606  NA 140  < > 137  < > 138 134* 137  K 4.6  < > 3.9  < > 3.6 3.9 3.2*  CL 96*  < > 94*  < > 96* 92* 95*  CO2 35*  < > 32  < > 34* 30 33*  GLUCOSE 158*  < >  156*  < > 162* 295* 131*  BUN 12  < > 11  < > 13 14 14   CREATININE 1.86*  < > 1.56*  < > 1.60* 1.64* 1.36*  CALCIUM 8.4*  < > 8.5*  < > 8.7* 8.6* 8.9  PROT 5.7*  --  6.5  --   --   --   --   ALBUMIN 2.5*  --  2.7*  --   --   --   --   AST 44*  --  32  --   --   --   --   ALT 29  --  29  --   --   --   --   ALKPHOS 195*  --  180*  --   --   --   --   BILITOT 1.2  --  1.5*  --   --   --   --   GFRNONAA 35*  < > 44*  < > 42* 41* 52*  GFRAA 41*  < > 51*  < > 49* 48* 60*  ANIONGAP 9  < > 11  < > 8 12 9   < > = values in this interval not displayed.   Hematology  Recent Labs Lab 06/05/16 0309 06/07/16 0357 06/08/16 0606  WBC 7.7 8.2 7.1  RBC 4.74 4.54 4.58  HGB 13.3 12.5* 12.6*  HCT 41.7 39.6 40.1  MCV 88.0 87.2 87.6  MCH 28.1 27.5 27.5  MCHC 31.9 31.6 31.4  RDW 15.8* 15.8* 15.9*  PLT 185 189 189    Cardiac Enzymes No results for input(s): TROPONINI in the last 168 hours. No results for input(s): TROPIPOC in the last 168 hours.   BNP  Recent Labs Lab 06/05/16 1154 06/07/16 0357  BNP 619.3* 457.2*     DDimer No results for input(s): DDIMER in the last 168 hours.   Radiology    No results found.  Cardiac Studies   Echo 06/05/16 Study  Conclusions  - Left ventricle: The cavity size was mildly dilated. Wall   thickness was normal. The estimated ejection fraction was 10%.   Diffuse hypokinesis. No apical thrombus with Definity contrast.   The study is not technically sufficient to allow evaluation of LV   diastolic function. - Mitral valve: Mildly thickened leaflets . There was mild   regurgitation. - Left atrium: Severely dilated. - Right ventricle: The cavity size was moderately dilated.   Moderately reduced systolic function. - Right atrium: The atrium was normal in size. - Tricuspid valve: There was moderate regurgitation. - Pulmonary arteries: PA peak pressure: 68 mm Hg (S). - Inferior vena cava: The vessel was normal in size. The   respirophasic diameter changes were in the normal range (>= 50%),   consistent with normal central venous pressure.  Impressions:  - Compared to a prior study in 01/2016, there has been no   significant change.  Patient Profile     71 y.o. male  with past medical history significant for CKD, atrial flutter anticoagualted with Xarelto, DM, bilateral BKA, ischemic cardiomyopathy with EF 10-15% per echo in 02/2016, CAD s/p CABG in 2001 in [place], NSTEMI 02/2016 who was found unresponsive by roommate. ABG showed acute hypercarbic respiratory acidosis. CXR showed pulmonary edema. He was admitted with acute encephalopathy.  Assessment & Plan    Acute on Chronic systolic heart failure -EF in 78/2956 was 10-15%-no change this adm.- Although his ejection fraction has fallen over the past year,  -Metoprolol has been  switched to coreg. No ACE-I/ARB due to low BP and elevated SCr -Was diuresed with lasix 80 mg TID, now on Lasix 80 mg PO daily. Wt down from 288 lbs to 234 lbs. I&O net negative 25L since admission. Continues to diureses well with oral dosing   -BNP on admission 1357, 3/2 457 -Breathing is much better. He is able to lie flat to sleep and edema is greatly improved.  -BP was  low on IV diuretic. Diuretic changed to oral dosing with improved blood pressure.  -Pt denied taking diuretics prior to admission. It is unclear whether he knows what he takes or he is noncompliant. He will need to be on diuretic out patient and should weigh daily. -Repeat Echo shows EF 10% and diffuse hypokinesis -Pt appears to be back to his baseline.  -BP is better now. May be able to titrate carvedilol. HR is around 100.  Syncope -With severe diastolic dysfunction this could have been related to arrhythmia, but no arrhythmias noted since admission and he responded to Narcan in the ED. He has maintained sinus rhythm with PVC's.   Respiratory failure -Pt hypoxic and hypercapneic on admission. Improved with Narcan. Now diuresed over 50 pounds of fluid. Breathing and edema better.   CAD -S/P CABG 2001. NSTEMI and occluded LIMA to LAD- PTCA to LAD in 02/2016 with only modest improvement. He has known Occlusion of the native RCA and occlusion of the SVG to RCA. -Medical therapy includes Plavix, beta blocker, statin -Troponins 0.04, 0.05. Seems to be chronically mildly elevated (0.09 in 03/2016) -No chest pain at time of presentation or since then -Continue beta blocker, Plavix, statin. ARB is on hold due to renal function.   Paroxysmal atrial fibrillation -Maintaining sinus rhythm on metoprolol -CHA2DS2-VASc Score is 6 (CHF, age, stroke (2), DM, Vascular disease). He takes Xarelto for stroke risk reduction  CKD stage 3 -SCr 1.36 today.   Seems a bit better   OSA -Pt has been told that he has sleep apnea, but he has refused CPAP. I discussed the benefits of treating sleep apnea for heart health, but he currently does not want to pursue this.   DM -Followed by IM. S/P bilat BKA    Kristeen Miss, MD  06/08/2016 12:57 PM    [facility] [address] [place],  [address] [place], [place]  [address] Pager [phone] Phone: [phone]; Fax: [phone]

## 2016-06-08 NOTE — Progress Notes (Addendum)
Patient ID: Leroy [name], male   DOB: 10-10-1945, 71 y.o.   MRN: [id number]  PROGRESS NOTE    VALDIS BEVILL  WUJ:[id number] DOB: 1945/04/18 DOA: 05/30/2016  PCP: [name], MD   Brief Narrative:  71 year old male with extensive cardiac history including 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 Patient also has bilateral BKA.  Patient presented to ED 05/30/2016 with altered mental status, found by his roommate unresponsive, EMS was called and patient was placed on NIPPV. Patient was seen by critical care on the admission, Narcan was administered with almost immediate improvement in mental status. Altered mental status thought to be secondary to narcotic-induced encephalopathy and hypercarbia. Patient was seen by cardiology in consultation for acute on chronic systolic heart failure.    Assessment & Plan:   Acute encephalopathy, likely drug-induced - Thought to be secondary to narcotics this patient's mental status improved almost immediately with Narcan - CT head showed remote infarct but no acute findings - EEG did not show seizure activity; there was diffuse slowing but nonspecific  - Stable  Acute on chronic systolic heart failure / acute respiratory failure with hypoxia and hypercapnia - Echo on this admission with EF 10% - Patient was diuresed with Lasix 80 mg 3 times daily and gradually decreased to 40 mg daily this am - Continue hydralazine 37.5 mg PO Q 8 hours and Imdur 30 mg daily  - Continue daily weight and strict intake and output - Appreciate cardiology following   CAD status post CABG 2001 / NSTEMI and occluded LIMA to LAD- PTCA to LAD in 02/2016  - Troponin 0.04, 0.05,  seems to be chronically mildly elevated, 0.09 in December 2017 - Continue beta blocker, Plavix, statin - ARB on hold due to renal insufficiency   Paroxysmal atrial fibrillation - CHADS vasc score 6 - Continue anticoagulation with xarelto and Plavix - Rate controlled with Coreg  Chronic kidney disease stage III - Baseline creatinine 1.7 in January 2014 - Creatinine within baseline range  Hypokalemia - Due to Lasix - Supplemented  Essential hypertension - Continue carvedilol, hydralazine, isosorbide  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   Objective: Vitals:   06/07/16 2011 06/07/16 2047 06/08/16 0417 06/08/16 0653  BP: (!) 98/53 (!) 111/53 130/86 115/76  Pulse: 99 94 99 90  Resp: 18  18   Temp: 98.6 F (37 C)  98.2 F (36.8 C)   TempSrc: Oral  Oral   SpO2: 96%  96%   Weight:   106.2 kg (234 lb 3.2 oz)   Height:  Intake/Output Summary (Last 24 hours) at 06/08/16 1337 Last data filed at 06/07/16 2235  Gross per 24 hour  Intake                0 ml  Output              900 ml  Net             -900 ml   Filed Weights   06/06/16 0624 06/07/16 0523 06/08/16 0417  Weight: 108.5 kg (239 lb 4.8 oz) 106.2 kg (234 lb 3.2 oz) 106.2 kg (234 lb 3.2 oz)    Examination:  General exam: , no distress, calm and comfortable  Respiratory system: Bilateral air entry,  no wheezing  Cardiovascular system: S1 & S2 heard, rate controlled  Gastrointestinal system: (+) BS, non tender Central nervous system: No focal deficits  Extremities: bilateral BKA Skin: skin is warm and dry  Psychiatry: Normal mood and behavior  Data Reviewed: I have personally reviewed following labs and imaging studies  CBC:  Recent Labs Lab 06/03/16 0453 06/04/16 0508 06/05/16 0309 06/07/16 0357 06/08/16 0606  WBC 6.1 6.8 7.7 8.2 7.1  HGB 13.0 13.5 13.3 12.5* 12.6*  HCT 42.2 42.7 41.7 39.6 40.1  MCV 89.8 88.6 88.0 87.2 87.6  PLT 178 194 185 189 189   Basic Metabolic Panel:  Recent Labs Lab 06/02/16 0522 06/03/16 0453 06/04/16 0508 06/05/16 0309 06/06/16 0414 06/07/16 0357 06/08/16 0606  NA 140 140 137 137 138 134* 137  K 4.6 3.3* 3.9 3.3* 3.6 3.9 3.2*  CL 96* 94* 94* 92* 96* 92* 95*  CO2 35* 36* 32 35* 34* 30 33*  GLUCOSE 158* 200* 156* 168* 162* 295* 131*  BUN 12 11 11 13 13 14 14   CREATININE 1.86* 1.63* 1.56* 1.55* 1.60* 1.64* 1.36*  CALCIUM 8.4* 8.5* 8.5* 8.9 8.7* 8.6* 8.9  MG 1.9 1.7  --   --   --   --   --   PHOS 3.4 3.1  --   --   --   --   --    GFR: Estimated Creatinine Clearance: 61.6 mL/min (by C-G formula based on SCr of 1.36 mg/dL (H)). Liver Function Tests:  Recent Labs Lab 06/02/16 0522 06/04/16 0508  AST 44* 32  ALT 29 29  ALKPHOS 195* 180*  BILITOT 1.2 1.5*  PROT 5.7* 6.5  ALBUMIN 2.5* 2.7*   No results for input(s): LIPASE, AMYLASE in the last 168 hours. No results for input(s): AMMONIA in the last 168 hours. Coagulation Profile: No results for input(s): INR, PROTIME in the last 168 hours. Cardiac Enzymes: No results for input(s): CKTOTAL, CKMB, CKMBINDEX, TROPONINI in the last 168 hours. BNP (last 3 results) No results for input(s): PROBNP in the last 8760 hours. HbA1C: No results for input(s): HGBA1C in the last 72 hours. CBG:  Recent Labs Lab 06/07/16 1127 06/07/16 1624 06/07/16 2233 06/08/16 0729 06/08/16 1213    GLUCAP 178* 175* 184* 122* 161*   Lipid Profile: No results for input(s): CHOL, HDL, LDLCALC, TRIG, CHOLHDL, LDLDIRECT in the last 72 hours. Thyroid Function Tests: No results for input(s): TSH, T4TOTAL, FREET4, T3FREE, THYROIDAB in the last 72 hours. Anemia Panel: No results for input(s): VITAMINB12, FOLATE, FERRITIN, TIBC, IRON, RETICCTPCT in the last 72 hours. Urine analysis:    Component Value Date/Time   COLORURINE YELLOW 05/30/2016 2142   APPEARANCEUR HAZY (A) 05/30/2016 2142   LABSPEC 1.014 05/30/2016 2142   PHURINE 5.0 05/30/2016 2142  GLUCOSEU NEGATIVE 05/30/2016 2142   GLUCOSEU NEGATIVE 11/13/2015 1148   HGBUR NEGATIVE 05/30/2016 2142   BILIRUBINUR NEGATIVE 05/30/2016 2142   KETONESUR NEGATIVE 05/30/2016 2142   PROTEINUR 100 (A) 05/30/2016 2142   UROBILINOGEN 0.2 11/13/2015 1148   NITRITE NEGATIVE 05/30/2016 2142   LEUKOCYTESUR NEGATIVE 05/30/2016 2142   Sepsis Labs: @LABRCNTIP (procalcitonin:4,lacticidven:4)   Culture, blood (Routine x 2)     Status: None   Collection Time: 05/30/16  1:05 PM  Result Value Ref Range Status   Specimen Description BLOOD RIGHT ANTECUBITAL  Final   Special Requests IN PEDIATRIC BOTTLE  4CC  Final   Culture NO GROWTH 5 DAYS  Final   Report Status 06/04/2016 FINAL  Final  MRSA PCR Screening     Status: Abnormal   Collection Time: 05/30/16  6:59 PM  Result Value Ref Range Status   MRSA by PCR POSITIVE (A) NEGATIVE Final  Culture, blood (Routine x 2)     Status: None   Collection Time: 05/30/16  7:29 PM  Result Value Ref Range Status   Specimen Description BLOOD LEFT HAND  Final   Special Requests IN PEDIATRIC BOTTLE 1CC  Final   Culture NO GROWTH 5 DAYS  Final   Report Status 06/04/2016 FINAL  Final      Radiology Studies: Dg Chest Port 1 View Result Date: 06/03/2016 Stable cardiomegaly. No active disease. No evidence of pneumonia. No evidence of active CHF at this time.    US Abdomen Limited Ruq Result Date:  06/04/2016 Minimal gallbladder sludge.  Gallbladder otherwise unremarkable. Right-sided pleural effusion.     Scheduled Meds: . carvedilol  3.125 mg Oral BID WC  . clopidogrel  75 mg Oral Daily  . docusate sodium  100 mg Oral BID  . furosemide  40 mg Oral Daily  . hydrALAZINE  37.5 mg Oral Q8H  . insulin aspart  0-15 Units Subcutaneous TID WC  . insulin aspart  3 Units Subcutaneous TID WC  . insulin glargine  25 Units Subcutaneous Q2200  . isosorbide mononitrate  30 mg Oral Daily  . pantoprazole  40 mg Oral Q1200  . potassium chloride  40 mEq Oral Once  . rivaroxaban  20 mg Oral Q supper   Continuous Infusions:   LOS: 9 days    Time spent: 15 minutes  Greater than 50% of the time spent on counseling and coordinating the care.   Manson Passey, MD [facility] Pager [phone]  If 7PM-7AM, please contact night-coverage [email or web address] Password [email or web address] 06/08/2016, 1:37 PM

## 2016-06-09 LAB — BASIC METABOLIC PANEL
Anion gap: 8 (ref 5–15)
BUN: 14 mg/dL (ref 6–20)
CHLORIDE: 97 mmol/L — AB (ref 101–111)
CO2: 30 mmol/L (ref 22–32)
Calcium: 9 mg/dL (ref 8.9–10.3)
Creatinine, Ser: 1.49 mg/dL — ABNORMAL HIGH (ref 0.61–1.24)
GFR calc Af Amer: 53 mL/min — ABNORMAL LOW (ref >60)
GFR calc non Af Amer: 46 mL/min — ABNORMAL LOW (ref >60)
GLUCOSE: 137 mg/dL — AB (ref 65–99)
Potassium: 3.5 mmol/L (ref 3.5–5.1)
SODIUM: 135 mmol/L (ref 135–145)

## 2016-06-09 LAB — GLUCOSE, CAPILLARY
Glucose-Capillary: 116 mg/dL — ABNORMAL HIGH (ref 65–99)
Glucose-Capillary: 224 mg/dL — ABNORMAL HIGH (ref 65–99)
Glucose-Capillary: 134 mg/dL — ABNORMAL HIGH (ref 65–99)

## 2016-06-09 LAB — CBC
HEMATOCRIT: 40.5 % (ref 39.0–52.0)
Hemoglobin: 12.8 g/dL — ABNORMAL LOW (ref 13.0–17.0)
MCH: 27.6 pg (ref 26.0–34.0)
MCHC: 31.6 g/dL (ref 30.0–36.0)
MCV: 87.3 fL (ref 78.0–100.0)
Platelets: 205 10*3/uL (ref 150–400)
RBC: 4.64 MIL/uL (ref 4.22–5.81)
RDW: 16 % — ABNORMAL HIGH (ref 11.5–15.5)
WBC: 6.9 10*3/uL (ref 4.0–10.5)

## 2016-06-09 NOTE — Progress Notes (Signed)
Progress Note  Patient Name: Leroy [name] Date of Encounter: 06/09/2016  Primary Cardiologist: Dr Antoine Poche  Subjective  71 yo, hx of Isch cardiomyopathy. Was found down by roommate and admitted on Feb. 22.  Has a history of atrial flutter. Has a history of coronary artery disease and is status post coronary artery bypass graft in 2001 while living in [place].  Hx of systolic chf in the past - EF has been 30-35%  Echo 2/28 shows EF 10%.   Mild MR ,moderate - severe pulmonary HTN with estimated pulmonary artery pressure of 68 mmHg.  Pt has no chest pain. He is feeling weak and was unable to stand on his prosthetics yesterday. He feels that his breathing is near back to normal. He is still requiring supplemental oxygen and says that he has oxygen at home wears it sometimes.   Inpatient Medications    Scheduled Meds: . carvedilol  3.125 mg Oral BID WC  . clopidogrel  75 mg Oral Daily  . docusate sodium  100 mg Oral BID  . furosemide  40 mg Oral Daily  . hydrALAZINE  37.5 mg Oral Q8H  . insulin aspart  0-15 Units Subcutaneous TID WC  . insulin aspart  3 Units Subcutaneous TID WC  . insulin glargine  25 Units Subcutaneous Q2200  . isosorbide mononitrate  30 mg Oral Daily  . pantoprazole  40 mg Oral Q1200  . rivaroxaban  20 mg Oral Q supper   Continuous Infusions:  PRN Meds: sodium chloride, ipratropium-albuterol   Vital Signs    Vitals:   06/08/16 0653 06/08/16 1300 06/08/16 2057 06/09/16 0350  BP: 115/76 102/63 (!) 130/56 (!) 90/57  Pulse: 90 91 (!) 103 99  Resp:  18 20 18   Temp:  97.7 F (36.5 C) 98.2 F (36.8 C) 98.1 F (36.7 C)  TempSrc:  Oral Oral Oral  SpO2:  98% 98% 100%  Weight:    234 lb 4.8 oz (106.3 kg)  Height:        Intake/Output Summary (Last 24 hours) at 06/09/16 1117 Last data filed at 06/09/16 0900  Gross per 24 hour  Intake             1320 ml  Output              600 ml  Net              720 ml   Filed Weights   06/07/16 0523 06/08/16  0417 06/09/16 0350  Weight: 234 lb 3.2 oz (106.2 kg) 234 lb 3.2 oz (106.2 kg) 234 lb 4.8 oz (106.3 kg)    Telemetry   Sinus rhythm/sinus tach with rates in the 90's-low 100's, PVC's - Personally Reviewed  ECG    No new tracings.   Physical Exam   GEN: No acute distress.   Neck: No JVD Cardiac: RRR, no murmurs, rubs, or gallops.  Respiratory: Clear to auscultation bilaterally. GI: Soft, nontender, non-distended  MS: No edema; Bilateral BKA Neuro:  Nonfocal  Psych: Normal affect   Labs    Chemistry Recent Labs Lab 06/04/16 0508  06/07/16 0357 06/08/16 0606 06/09/16 0449  NA 137  < > 134* 137 135  K 3.9  < > 3.9 3.2* 3.5  CL 94*  < > 92* 95* 97*  CO2 32  < > 30 33* 30  GLUCOSE 156*  < > 295* 131* 137*  BUN 11  < > 14 14 14   CREATININE 1.56*  < >  1.64* 1.36* 1.49*  CALCIUM 8.5*  < > 8.6* 8.9 9.0  PROT 6.5  --   --   --   --   ALBUMIN 2.7*  --   --   --   --   AST 32  --   --   --   --   ALT 29  --   --   --   --   ALKPHOS 180*  --   --   --   --   BILITOT 1.5*  --   --   --   --   GFRNONAA 44*  < > 41* 52* 46*  GFRAA 51*  < > 48* 60* 53*  ANIONGAP 11  < > 12 9 8   < > = values in this interval not displayed.   Hematology  Recent Labs Lab 06/07/16 0357 06/08/16 0606 06/09/16 0449  WBC 8.2 7.1 6.9  RBC 4.54 4.58 4.64  HGB 12.5* 12.6* 12.8*  HCT 39.6 40.1 40.5  MCV 87.2 87.6 87.3  MCH 27.5 27.5 27.6  MCHC 31.6 31.4 31.6  RDW 15.8* 15.9* 16.0*  PLT 189 189 205    Cardiac Enzymes No results for input(s): TROPONINI in the last 168 hours. No results for input(s): TROPIPOC in the last 168 hours.   BNP  Recent Labs Lab 06/05/16 1154 06/07/16 0357  BNP 619.3* 457.2*     DDimer No results for input(s): DDIMER in the last 168 hours.   Radiology    No results found.  Cardiac Studies   Echo 06/05/16 Study Conclusions  - Left ventricle: The cavity size was mildly dilated. Wall   thickness was normal. The estimated ejection fraction was 10%.    Diffuse hypokinesis. No apical thrombus with Definity contrast.   The study is not technically sufficient to allow evaluation of LV   diastolic function. - Mitral valve: Mildly thickened leaflets . There was mild   regurgitation. - Left atrium: Severely dilated. - Right ventricle: The cavity size was moderately dilated.   Moderately reduced systolic function. - Right atrium: The atrium was normal in size. - Tricuspid valve: There was moderate regurgitation. - Pulmonary arteries: PA peak pressure: 68 mm Hg (S). - Inferior vena cava: The vessel was normal in size. The   respirophasic diameter changes were in the normal range (>= 50%),   consistent with normal central venous pressure.  Impressions:  - Compared to a prior study in 01/2016, there has been no   significant change.  Patient Profile     71 y.o. male  with past medical history significant for CKD, atrial flutter anticoagualted with Xarelto, DM, bilateral BKA, ischemic cardiomyopathy with EF 10-15% per echo in 02/2016, CAD s/p CABG in 2001 in [place], NSTEMI 02/2016 who was found unresponsive by roommate. ABG showed acute hypercarbic respiratory acidosis. CXR showed pulmonary edema. He was admitted with acute encephalopathy.  Assessment & Plan    Acute on Chronic systolic heart failure -EF in 16/1096 was 10-15%-no change this adm.- Although his ejection fraction has fallen over the past year,  -Metoprolol has been switched to coreg. No ACE-I/ARB due to low BP and elevated SCr -Was diuresed with lasix 80 mg TID, now on Lasix 80 mg PO daily. Wt down from 288 lbs to 234 lbs. I&O net negative 25L since admission. Continues to diureses well with oral dosing   I spoke with Dr. Shirlee Latch yesterday    His EF is low but he is not a candidate for LVAD.  In addition, no role for Milrinone infusion since we have been able to diurese him and symptomatically he is feeling better.    -BNP on admission 1357, 3/2 457 -Breathing is much  better. He is able to lie flat to sleep and edema is greatly improved.  -BP was low on IV diuretic. Diuretic changed to oral dosing with improved blood pressure.  -Pt denied taking diuretics prior to admission. It is unclear whether he knows what he takes or he is noncompliant. He will need to be on diuretic out patient and should weigh daily. -Repeat Echo shows EF 10% and diffuse hypokinesis -Pt appears to be back to his baseline.  -BP is better now. May be able to titrate carvedilol. HR is around 100.  Syncope -With severe diastolic dysfunction this could have been related to arrhythmia, but no arrhythmias noted since admission and he responded to Narcan in the ED. He has maintained sinus rhythm with PVC's.  Will continue to follow   Respiratory failure -Pt hypoxic and hypercapneic on admission. Improved with Narcan. Now diuresed over 50 pounds of fluid. Breathing and edema better.   CAD -S/P CABG 2001. NSTEMI and occluded LIMA to LAD- PTCA to LAD in 02/2016 with only modest improvement. He has known Occlusion of the native RCA and occlusion of the SVG to RCA. -Medical therapy includes Plavix, beta blocker, statin -Troponins 0.04, 0.05. Seems to be chronically mildly elevated (0.09 in 03/2016) -No chest pain at time of presentation or since then -Continue beta blocker, Plavix, statin. ARB is on hold due to renal function.   Paroxysmal atrial fibrillation -Maintaining sinus rhythm on metoprolol -CHA2DS2-VASc Score is 6 (CHF, age, stroke (2), DM, Vascular disease). He takes Xarelto for stroke risk reduction  CKD stage 3 -SCr 1.36 today.   Seems a bit better   OSA -Pt has been told that he has sleep apnea, but he has refused CPAP. I discussed the benefits of treating sleep apnea for heart health, but he currently does not want to pursue this.       Kristeen Miss, MD  06/09/2016 11:17 AM    [facility] [address] [place],  [address] [place], [place]   [address] Pager [phone] Phone: [phone]; Fax: [phone]

## 2016-06-09 NOTE — Progress Notes (Signed)
Patient ID: NICKOLES GREGORI, male   DOB: 1945/12/17, 71 y.o.   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 Patient also has bilateral BKA.  Patient presented to ED 05/30/2016 with altered mental status, found by his roommate unresponsive, EMS was called and patient was placed on NIPPV. Patient was seen by critical care on the admission, Narcan was administered with almost immediate improvement in mental status. Altered mental status thought to be secondary to narcotic-induced encephalopathy and hypercarbia. Patient was seen by cardiology in consultation for acute on chronic systolic heart failure.    Assessment & Plan:   Acute encephalopathy, likely drug-induced - Thought to be secondary to narcotics this patient's mental status improved almost immediately with Narcan - CT head showed remote infarct but no acute findings - EEG did not show seizure activity; there was diffuse slowing but nonspecific   Acute on chronic systolic heart failure / acute respiratory failure with hypoxia and hypercapnia - Echo on this admission with EF 10% - Patient was diuresed with Lasix 80 mg 3 times daily and gradually decreased to 40 mg daily  - Continue hydralazine 37.5 mg PO Q 8 hours and Imdur 30 mg daily  - Weight down 288 lbs to 234 lbs - Appreciate cardiology following   CAD status post CABG 2001 / NSTEMI and occluded LIMA to LAD- PTCA to LAD in 02/2016  - Troponin 0.04, 0.05, seems to be chronically mildly  elevated, 0.09 in December 2017 - Continue beta blocker, Plavix, statin - ARB on hold due to renal insufficiency   Paroxysmal atrial fibrillation - CHADS vasc score 6 - Continue anticoagulation with xarelto and Plavix - Rate controlled with Coreg 3.125 mg BID  Chronic kidney disease stage III - Baseline creatinine 1.7 in January 2014 - Creatinine within baseline range  Hypokalemia - Due to Lasix - Supplemented  Essential hypertension - Continue carvedilol, hydralazine, isosorbide  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   Objective: Vitals:   06/08/16 1300 06/08/16 2057 06/09/16 0350 06/09/16 1227  BP: 102/63 (!) 130/56 (!) 90/57 119/80  Pulse: 91 (!) 103 99 94  Resp: 18 20 18 18   Temp: 97.7 F (36.5 C) 98.2 F (36.8 C) 98.1 F (36.7 C) 97.8 F (36.6 C)  TempSrc: Oral Oral Oral Oral  SpO2: 98% 98% 100% 98%  Weight:   106.3 kg (234 lb 4.8 oz)   Height:  Intake/Output Summary (Last 24 hours) at 06/09/16 1314 Last data filed at 06/09/16 1100  Gross per 24 hour  Intake             1080 ml  Output              801 ml  Net              279 ml   Filed Weights   06/07/16 0523 06/08/16 0417 06/09/16 0350  Weight: 106.2 kg (234 lb 3.2 oz) 106.2 kg (234 lb 3.2 oz) 106.3 kg (234 lb 4.8 oz)    Examination:  General exam: , no distress Respiratory system: Bilateral air entry, no wheezing    Cardiovascular system: S1 & S2 heard, rate controlled  Gastrointestinal system: (+) BS, no distention, no tenderness  Central nervous system: Nonfocal  Extremities: bilateral BKA Skin: no rash Psychiatry: Normal mood and behavior, not agitated   Data Reviewed: I have personally reviewed following labs and imaging studies  CBC:  Recent Labs Lab 06/04/16 0508 06/05/16 0309 06/07/16 0357 06/08/16 0606 06/09/16 0449  WBC 6.8 7.7 8.2 7.1 6.9  HGB 13.5 13.3 12.5* 12.6* 12.8*  HCT 42.7 41.7 39.6 40.1 40.5  MCV 88.6 88.0 87.2 87.6 87.3  PLT 194 185 189 189 205   Basic Metabolic Panel:  Recent Labs Lab 06/03/16 0453  06/05/16 0309 06/06/16 0414 06/07/16 0357 06/08/16 0606 06/09/16 0449  NA 140  < > 137 138 134* 137 135  K 3.3*  < > 3.3* 3.6 3.9 3.2* 3.5  CL 94*  < > 92* 96* 92* 95* 97*  CO2 36*  < > 35* 34* 30 33* 30  GLUCOSE 200*  < > 168* 162* 295* 131* 137*  BUN 11  < > 13 13 14 14 14   CREATININE 1.63*  < > 1.55* 1.60* 1.64* 1.36* 1.49*  CALCIUM 8.5*  < > 8.9 8.7* 8.6* 8.9 9.0  MG 1.7  --   --   --   --   --   --   PHOS 3.1  --   --   --   --   --   --   < > = values in this interval not displayed. GFR: Estimated Creatinine Clearance: 56.2 mL/min (by C-G formula based on SCr of 1.49 mg/dL (H)). Liver Function Tests:  Recent Labs Lab 06/04/16 0508  AST 32  ALT 29  ALKPHOS 180*  BILITOT 1.5*  PROT 6.5  ALBUMIN 2.7*   No results for input(s): LIPASE, AMYLASE in the last 168 hours. No results for input(s): AMMONIA in the last 168 hours. Coagulation Profile: No results for input(s): INR, PROTIME in the last 168 hours. Cardiac Enzymes: No results for input(s): CKTOTAL, CKMB, CKMBINDEX, TROPONINI in the last 168 hours. BNP (last 3 results) No results for input(s): PROBNP in the last 8760 hours. HbA1C: No results for input(s): HGBA1C in the last 72 hours. CBG:  Recent Labs Lab 06/08/16 1213 06/08/16 1601 06/08/16 2056 06/09/16 0751 06/09/16 1151   GLUCAP 161* 165* 122* 116* 224*   Lipid Profile: No results for input(s): CHOL, HDL, LDLCALC, TRIG, CHOLHDL, LDLDIRECT in the last 72 hours. Thyroid Function Tests: No results for input(s): TSH, T4TOTAL, FREET4, T3FREE, THYROIDAB in the last 72 hours. Anemia Panel: No results for input(s): VITAMINB12, FOLATE, FERRITIN, TIBC, IRON, RETICCTPCT in the last 72 hours. Urine analysis:    Component Value Date/Time   COLORURINE YELLOW 05/30/2016 2142   APPEARANCEUR HAZY (A) 05/30/2016 2142  LABSPEC 1.014 05/30/2016 2142   PHURINE 5.0 05/30/2016 2142   GLUCOSEU NEGATIVE 05/30/2016 2142   GLUCOSEU NEGATIVE 11/13/2015 1148   HGBUR NEGATIVE 05/30/2016 2142   BILIRUBINUR NEGATIVE 05/30/2016 2142   KETONESUR NEGATIVE 05/30/2016 2142   PROTEINUR 100 (A) 05/30/2016 2142   UROBILINOGEN 0.2 11/13/2015 1148   NITRITE NEGATIVE 05/30/2016 2142   LEUKOCYTESUR NEGATIVE 05/30/2016 2142   Sepsis Labs: @LABRCNTIP (procalcitonin:4,lacticidven:4)   Culture, blood (Routine x 2)     Status: None   Collection Time: 05/30/16  1:05 PM  Result Value Ref Range Status   Specimen Description BLOOD RIGHT ANTECUBITAL  Final   Special Requests IN PEDIATRIC BOTTLE  4CC  Final   Culture NO GROWTH 5 DAYS  Final   Report Status 06/04/2016 FINAL  Final  MRSA PCR Screening     Status: Abnormal   Collection Time: 05/30/16  6:59 PM  Result Value Ref Range Status   MRSA by PCR POSITIVE (A) NEGATIVE Final  Culture, blood (Routine x 2)     Status: None   Collection Time: 05/30/16  7:29 PM  Result Value Ref Range Status   Specimen Description BLOOD LEFT HAND  Final   Special Requests IN PEDIATRIC BOTTLE 1CC  Final   Culture NO GROWTH 5 DAYS  Final   Report Status 06/04/2016 FINAL  Final      Radiology Studies: Dg Chest Port 1 View Result Date: 06/03/2016 Stable cardiomegaly. No active disease. No evidence of pneumonia. No evidence of active CHF at this time.    US Abdomen Limited Ruq Result Date:  06/04/2016 Minimal gallbladder sludge.  Gallbladder otherwise unremarkable. Right-sided pleural effusion.     Scheduled Meds: . carvedilol  3.125 mg Oral BID WC  . clopidogrel  75 mg Oral Daily  . docusate sodium  100 mg Oral BID  . furosemide  40 mg Oral Daily  . hydrALAZINE  37.5 mg Oral Q8H  . insulin aspart  0-15 Units Subcutaneous TID WC  . insulin aspart  3 Units Subcutaneous TID WC  . insulin glargine  25 Units Subcutaneous Q2200  . isosorbide mononitrate  30 mg Oral Daily  . pantoprazole  40 mg Oral Q1200  . rivaroxaban  20 mg Oral Q supper   Continuous Infusions:   LOS: 10 days    Time spent: 15 minutes  Greater than 50% of the time spent on counseling and coordinating the care.   Manson Passey, MD [facility] Pager [phone]  If 7PM-7AM, please contact night-coverage [email or web address] Password [email or web address] 06/09/2016, 1:14 PM

## 2016-06-09 NOTE — Progress Notes (Signed)
Pt is alert times 4, vitals stable,  ST on monitor, no any other specific complaints at this point, will continue to monitor the patient.

## 2016-06-10 LAB — GLUCOSE, CAPILLARY
GLUCOSE-CAPILLARY: 104 mg/dL — AB (ref 65–99)
Glucose-Capillary: 178 mg/dL — ABNORMAL HIGH (ref 65–99)
Glucose-Capillary: 233 mg/dL — ABNORMAL HIGH (ref 65–99)
Glucose-Capillary: 154 mg/dL — ABNORMAL HIGH (ref 65–99)
Glucose-Capillary: 157 mg/dL — ABNORMAL HIGH (ref 65–99)

## 2016-06-10 MED ORDER — INSULIN GLARGINE 100 UNIT/ML ~~LOC~~ SOLN: 25.0000 [IU] | Freq: Every day | SUBCUTANEOUS | 11 refills | Status: DC

## 2016-06-10 MED ORDER — HYDRALAZINE HCL 25 MG PO TABS: 37.5000 mg | ORAL_TABLET | Freq: Three times a day (TID) | ORAL | 0 refills | Status: AC

## 2016-06-10 MED ORDER — INSULIN ASPART 100 UNIT/ML ~~LOC~~ SOLN: 0.0000 [IU] | Freq: Three times a day (TID) | SUBCUTANEOUS | 11 refills | Status: AC

## 2016-06-10 MED ORDER — INSULIN ASPART 100 UNIT/ML ~~LOC~~ SOLN: 3.0000 [IU] | Freq: Three times a day (TID) | SUBCUTANEOUS | 11 refills | Status: DC

## 2016-06-10 MED ORDER — FUROSEMIDE 40 MG PO TABS: 40.0000 mg | ORAL_TABLET | Freq: Every day | ORAL | 0 refills | Status: AC

## 2016-06-10 MED ORDER — CARVEDILOL 3.125 MG PO TABS: 3.1250 mg | ORAL_TABLET | Freq: Two times a day (BID) | ORAL | 0 refills | Status: AC

## 2016-06-10 MED ORDER — RIVAROXABAN 20 MG PO TABS: 20.0000 mg | ORAL_TABLET | Freq: Every day | ORAL | 0 refills | Status: AC

## 2016-06-10 MED ORDER — IPRATROPIUM-ALBUTEROL 0.5-2.5 (3) MG/3ML IN SOLN: 3.0000 mL | RESPIRATORY_TRACT | 0 refills | Status: DC | PRN

## 2016-06-10 MED ORDER — POTASSIUM CHLORIDE ER 10 MEQ PO TBCR: 10.0000 meq | EXTENDED_RELEASE_TABLET | Freq: Every day | ORAL | 0 refills | Status: AC

## 2016-06-10 MED ORDER — DOCUSATE SODIUM 100 MG PO CAPS: 100.0000 mg | ORAL_CAPSULE | Freq: Two times a day (BID) | ORAL | 0 refills | Status: AC

## 2016-06-10 MED ORDER — ISOSORBIDE MONONITRATE ER 30 MG PO TB24: 30.0000 mg | ORAL_TABLET | Freq: Every day | ORAL | 0 refills | Status: AC

## 2016-06-10 MED ORDER — PANTOPRAZOLE SODIUM 40 MG PO TBEC: 40.0000 mg | DELAYED_RELEASE_TABLET | Freq: Every day | ORAL | 0 refills | Status: AC

## 2016-06-10 NOTE — Progress Notes (Signed)
PT Cancellation Note  Patient Details Name: Leroy [name] MRN: [id number] DOB: [birth year]   Cancelled Treatment:    Reason Eval/Treat Not Completed: Pain limiting ability to participate. Pt in obvious pain upon entry with squirming in bed and constant rubbing of L residual limb.  Pt declined PT services. Pt states he doesn't want pain medicine though.  Spoke with nursing.  Will check back as schedule permits.   Nabria Nevin LUBECK 06/10/2016, 12:56 PM

## 2016-06-10 NOTE — Progress Notes (Signed)
Patient refuses the use of CPAP 

## 2016-06-10 NOTE — Progress Notes (Signed)
Patient unable to dc to SNF until PT re-evaluates for insurance submission. [facility] is unable to accept patient on log. Will follow up tomorrow.  Leroy [name] LCSWA [phone]

## 2016-06-10 NOTE — Discharge Summary (Signed)
Physician Discharge Summary  Leroy [name] ZOX:[id number] DOB: 1946-02-24 DOA: 05/30/2016  PCP: [name], MD  Admit date: 05/30/2016 Discharge date: 06/10/2016  Recommendations for Outpatient Follow-up:  Continue isosorbide, hydralazine, coreg as prescribed Continue xarelto 20 mg with supper. Continue to monitor kidney function (Creatinine) as pt on lasix, can be per SNF protocol  Discharge Diagnoses:  Active Problems:   Paroxysmal atrial fibrillation (HCC)   Chronic systolic heart failure (HCC)   Atherosclerosis of native arteries of the extremities with ulceration (HCC)   Diabetes mellitus type 2 with peripheral artery disease (HCC)   Chronic renal disease, stage 2, mildly decreased glomerular filtration rate (GFR) between 60-89 mL/min/1.73 square meter   Altered mental status   Acute respiratory failure with hypoxia and hypercapnia (HCC)   Polypharmacy   Ischemic cardiomyopathy   Hypokalemia    Discharge Condition: stable   Diet recommendation: as tolerated   History of present illness:  71 year old male with extensive cardiac history including 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 Patient also has bilateral BKA.  Patient presented to ED 05/30/2016 with altered mental status, found by his roommate unresponsive, EMS was called and patient was placed on NIPPV. Patient was seen by critical care on the admission, Narcan was administered with almost immediate improvement in mental status. Altered mental status thought to be secondary to narcotic-induced encephalopathy and hypercarbia. Patient was seen by cardiology in consultation for acute on chronic systolic heart failure.    Hospital Course:  Acute encephalopathy, likely drug-induced - Thought to be secondary to narcotics this patient's mental status improved almost immediately with Narcan - CT head showed remote infarct but no acute findings - EEG did not show seizure activity; there was diffuse slowing but nonspecific   Acute on chronic systolic heart failure / acute respiratory failure with hypoxia and hypercapnia - Echo on this admission with EF 10% - Patient was diuresed with Lasix 80 mg 3 times daily and gradually decreased to 40 mg daily  - Continue hydralazine 37.5 mg PO Q 8 hours and Imdur 30 mg daily  - Per cardio, okay for discharge from their standpoint   CAD status post CABG 2001 / NSTEMI and occluded LIMA to LAD- PTCA to LAD in 02/2016  - Troponin 0.04, 0.05, seems to be chronically mildly elevated, 0.09 in December 2017 - Continue beta blocker, Plavix, statin - ARB on hold due to renal insufficiency   Paroxysmal atrial fibrillation - CHADS vasc score 6 - Continue anticoagulation with xarelto and Plavix - Rate controlled with Coreg 3.125 mg BID  Chronic kidney disease stage III - Baseline creatinine 1.7 in January 2014 - Creatinine within baseline range  Hypokalemia - Due to Lasix - Supplemented  Essential hypertension - Continue carvedilol, hydralazine, isosorbide  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 - Continue Lantus to 25 units at bedtime and novolog 3 units TID - Continue sliding scale insulin  DVT prophylaxis: On xarelto  Code Status: full code  Family Communication: No family at the bedside this am    Consultants:   Cardio   TRH as primary 06/03/2016   Procedures:   ECHO - pending   EEG 05/15/2016 - mild to  moderate diffuse slowing of the waking background but nonspecific in etiology  Antimicrobials:   None    Signed:  Manson Passey, MD  [facility] 06/10/2016, 3:32 PM  Pager #: [phone]  Time  spent in minutes: less than 30 minutes    Discharge Exam: Vitals:   06/10/16 0509 06/10/16 1400  BP: 123/87 (!) 105/92  Pulse: 71 98  Resp: 20 18  Temp: 97.8 F (36.6 C) 98.2 F (36.8 C)   Vitals:   06/09/16 1227 06/09/16 2105 06/10/16 0509 06/10/16 1400  BP: 119/80 (!) 105/91 123/87 (!) 105/92  Pulse: 94 78 71 98  Resp: 18  20 18   Temp: 97.8 F (36.6 C) 98.2 F (36.8 C) 97.8 F (36.6 C) 98.2 F (36.8 C)  TempSrc: Oral Oral Oral Oral  SpO2: 98% 96% 95% 100%  Weight:   106.4 kg (234 lb 8 oz)   Height:        General: Pt is alert, follows commands appropriately, not in acute distress Cardiovascular: Regular rate and rhythm, S1/S2 (+) Respiratory:no wheezing, no crackles, no rhonchi Abdominal: Soft, non tender, non distended, bowel sounds +, no guarding Extremities: bilateral BKA Neuro: Grossly nonfocal  Discharge Instructions  Discharge Instructions    Call MD for:  persistant nausea and vomiting    Complete by:  As directed    Call MD for:  severe uncontrolled pain    Complete by:  As directed    Call MD for:  temperature >100.4    Complete by:  As directed    Diet - low sodium heart healthy    Complete by:  As directed    Discharge instructions    Complete by:  As directed    Continue isosorbide, hydralazine, coreg as prescribed Continue xarelto 20 mg with supper. Continue to monitor kidney function (Creatinine) as pt on lasix, can be per SNF protocol.   Increase activity slowly    Complete by:  As directed      Allergies as of 06/10/2016      Reactions   Statins Other (See Comments)   Reaction:  Muscle pain    Amlodipine Besylate Other (See Comments)   Reaction:  Muscle pain    Cephalexin Hives      Medication List    STOP taking these medications   ezetimibe 10 MG tablet Commonly known as:  ZETIA   Indacaterol-Glycopyrrolate 27.5-15.6 MCG Caps Commonly known as:  UTIBRON NEOHALER   Insulin Glargine 100 UNIT/ML Solostar Pen Commonly known as:   LANTUS Replaced by:  insulin glargine 100 UNIT/ML injection   lisinopril 5 MG tablet Commonly known as:  PRINIVIL,ZESTRIL   losartan 25 MG tablet Commonly known as:  COZAAR   metoprolol succinate 50 MG 24 hr tablet Commonly known as:  TOPROL-XL   mupirocin cream 2 % Commonly known as:  BACTROBAN   nitroGLYCERIN 0.4 MG SL tablet Commonly known as:  NITROSTAT   potassium chloride SA 20 MEQ tablet Commonly known as:  K-DUR,KLOR-CON     TAKE these medications   albuterol 108 (90 Base) MCG/ACT inhaler Commonly known as:  PROVENTIL HFA;VENTOLIN HFA Inhale 2 puffs into the lungs every 6 (six) hours as needed for wheezing or shortness of breath.   benzonatate 100 MG capsule Commonly known as:  TESSALON Take 100 mg by mouth 3 (three) times daily as needed for cough.   carvedilol 3.125 MG tablet Commonly known as:  COREG Take 1 tablet (3.125 mg total) by mouth 2 (two) times daily  with a meal.   clopidogrel 75 MG tablet Commonly known as:  PLAVIX Take 1 tablet (75 mg total) by mouth daily.   docusate sodium 100 MG capsule Commonly known as:  COLACE Take 1 capsule (100 mg total) by mouth 2 (two) times daily.   furosemide 40 MG tablet Commonly known as:  LASIX Take 1 tablet (40 mg total) by mouth daily. Start taking on:  06/11/2016 What changed:  medication strength  how much to take  when to take this   hydrALAZINE 25 MG tablet Commonly known as:  APRESOLINE Take 1.5 tablets (37.5 mg total) by mouth every 8 (eight) hours.   insulin aspart 100 UNIT/ML injection Commonly known as:  novoLOG Inject 0-15 Units into the skin 3 (three) times daily with meals.   insulin aspart 100 UNIT/ML injection Commonly known as:  novoLOG Inject 3 Units into the skin 3 (three) times daily with meals.   insulin glargine 100 UNIT/ML injection Commonly known as:  LANTUS Inject 0.25 mLs (25 Units total) into the skin daily at 10 pm. Replaces:  Insulin Glargine 100 UNIT/ML Solostar  Pen   ipratropium-albuterol 0.5-2.5 (3) MG/3ML Soln Commonly known as:  DUONEB Take 3 mLs by nebulization every 4 (four) hours as needed.   isosorbide mononitrate 30 MG 24 hr tablet Commonly known as:  IMDUR Take 1 tablet (30 mg total) by mouth daily. Start taking on:  06/11/2016   pantoprazole 40 MG tablet Commonly known as:  PROTONIX Take 1 tablet (40 mg total) by mouth daily at 12 noon. Start taking on:  06/11/2016   potassium chloride 10 MEQ tablet Commonly known as:  K-DUR Take 1 tablet (10 mEq total) by mouth daily.   rivaroxaban 20 MG Tabs tablet Commonly known as:  XARELTO Take 1 tablet (20 mg total) by mouth daily with supper. What changed:  when to take this   rosuvastatin 20 MG tablet Commonly known as:  CRESTOR Take 20 mg by mouth daily.       Contact information for follow-up providers    [name], MD. Schedule an appointment as soon as possible for a visit in 1 week(s).   Specialty:  Internal Medicine Contact information: [address] [place] [address] [phone]            Contact information for after-discharge care    Destination    [facility] Follow up.   Specialty:  Skilled Nursing Facility Contact information: [address] [place] [address] [phone]                   The results of significant diagnostics from this hospitalization (including imaging, microbiology, ancillary and laboratory) are listed below for reference.    Significant Diagnostic Studies: Ct Head Wo Contrast  Result Date: 05/30/2016 CLINICAL DATA:  Altered mental status. EXAM: CT HEAD WITHOUT CONTRAST TECHNIQUE: Contiguous axial images were obtained from the base of the skull through the vertex without intravenous contrast. COMPARISON:  Brain MRI 05/23/2015 FINDINGS: Brain: No acute intracranial hemorrhage. No focal mass lesion. No CT evidence of acute infarction. No midline shift or mass  effect. No hydrocephalus. Basilar cisterns are patent. Remote infarction LEFT cerebellum. Generalized cortical atrophy. Periventricular white matter hypodensities. Vascular: No hyperdense vessel or unexpected calcification. Skull: Normal. Negative for fracture or focal lesion. Sinuses/Orbits: Paranasal sinuses and mastoid air cells are clear. Orbits are clear. Other: Subcutaneous lesion LEFT occipital region unchanged (Image 7, series 3). IMPRESSION: 1. No acute  intracranial findings.  \ 2. White matter microvascular disease and atrophy. 3. Remote LEFT cerebellar infarction. Electronically Signed   By: Genevive Bi M.D.   On: 05/30/2016 19:03   Dg Chest Port 1 View  Result Date: 06/03/2016 CLINICAL DATA:  Atrial fibrillation and CHF. EXAM: PORTABLE CHEST 1 VIEW COMPARISON:  Chest x-rays dated 05/31/2016, 05/30/2016 and 03/25/2016. FINDINGS: Stable cardiomegaly. Median sternotomy wires appear grossly intact and stable in alignment. Lungs are presently clear. No pleural effusion or pneumothorax seen. No acute or suspicious osseous finding. IMPRESSION: Stable cardiomegaly. No active disease. No evidence of pneumonia. No evidence of active CHF at this time. Electronically Signed   By: Bary Richard M.D.   On: 06/03/2016 18:19   Dg Chest Port 1 View  Result Date: 05/31/2016 CLINICAL DATA:  Cough and wheezing EXAM: PORTABLE CHEST 1 VIEW COMPARISON:  May 30, 2016 FINDINGS: There is persistent cardiomegaly with pulmonary venous hypertension. Currently there is no appreciable edema or consolidation. No pleural effusions evident. No adenopathy. Patient is status post median sternotomy. No bone lesions. IMPRESSION: Pulmonary vascular congestion without frank edema or consolidation. Stable cardiac enlargement. Electronically Signed   By: Bretta Bang III M.D.   On: 05/31/2016 07:32   Dg Chest Portable 1 View  Result Date: 05/30/2016 CLINICAL DATA:  Short of breath EXAM: PORTABLE CHEST 1 VIEW  COMPARISON:  03/25/2016 FINDINGS: Postop CABG. Cardiac enlargement. Vascular congestion with mild edema. Small bilateral effusions bibasilar atelectasis. IMPRESSION: Congestive heart failure with mild edema and small bilateral pleural effusions Electronically Signed   By: Marlan Palau M.D.   On: 05/30/2016 13:32   US Abdomen Limited Ruq  Result Date: 06/04/2016 CLINICAL DATA:  71 year old male with elevated LFTs. Initial encounter. EXAM: US ABDOMEN LIMITED - RIGHT UPPER QUADRANT COMPARISON:  10/22/2015 CT. FINDINGS: Gallbladder: No gallstones. Minimal sludge. No gallbladder wall thickening or pericholecystic fluid. Patient not tender over this region during scanning per ultrasound technologist. Common bile duct: Diameter: 3.6 mm. Liver: No focal lesion identified. Within normal limits in parenchymal echogenicity. Right-sided pleural effusion. IMPRESSION: Minimal gallbladder sludge.  Gallbladder otherwise unremarkable. Right-sided pleural effusion. Electronically Signed   By: Lacy Duverney M.D.   On: 06/04/2016 09:44    Microbiology: No results found for this or any previous visit (from the past 240 hour(s)).   Labs: Basic Metabolic Panel:  Recent Labs Lab 06/05/16 0309 06/06/16 0414 06/07/16 0357 06/08/16 0606 06/09/16 0449  NA 137 138 134* 137 135  K 3.3* 3.6 3.9 3.2* 3.5  CL 92* 96* 92* 95* 97*  CO2 35* 34* 30 33* 30  GLUCOSE 168* 162* 295* 131* 137*  BUN 13 13 14 14 14   CREATININE 1.55* 1.60* 1.64* 1.36* 1.49*  CALCIUM 8.9 8.7* 8.6* 8.9 9.0   Liver Function Tests:  Recent Labs Lab 06/04/16 0508  AST 32  ALT 29  ALKPHOS 180*  BILITOT 1.5*  PROT 6.5  ALBUMIN 2.7*   No results for input(s): LIPASE, AMYLASE in the last 168 hours. No results for input(s): AMMONIA in the last 168 hours. CBC:  Recent Labs Lab 06/04/16 0508 06/05/16 0309 06/07/16 0357 06/08/16 0606 06/09/16 0449  WBC 6.8 7.7 8.2 7.1 6.9  HGB 13.5 13.3 12.5* 12.6* 12.8*  HCT 42.7 41.7 39.6 40.1 40.5   MCV 88.6 88.0 87.2 87.6 87.3  PLT 194 185 189 189 205   Cardiac Enzymes: No results for input(s): CKTOTAL, CKMB, CKMBINDEX, TROPONINI in the last 168 hours. BNP: BNP (last 3 results)  Recent Labs  05/30/16 1325 06/05/16 1154 06/07/16 0357  BNP 1,357.3* 619.3* 457.2*    ProBNP (last 3 results) No results for input(s): PROBNP in the last 8760 hours.  CBG:  Recent Labs Lab 06/09/16 1151 06/09/16 1656 06/09/16 2057 06/10/16 0734 06/10/16 1146  GLUCAP 224* 178* 134* 104* 233*

## 2016-06-10 NOTE — Progress Notes (Signed)
Pt. BP 87/55 HR 94. BP med scheduled for the night. On call for [facility] paged to make aware. Pt. Alert and stable. RN will continue to monitor. Lehi Phifer, Cheryll Dessert

## 2016-06-10 NOTE — Progress Notes (Signed)
Progress Note  Patient Name: Leroy [name] Date of Encounter: 06/10/2016  Primary Cardiologist: Dr. Antoine Poche   Subjective   Breathing well. No supplemental O2 requirements.   Inpatient Medications    Scheduled Meds: . carvedilol  3.125 mg Oral BID WC  . clopidogrel  75 mg Oral Daily  . docusate sodium  100 mg Oral BID  . furosemide  40 mg Oral Daily  . hydrALAZINE  37.5 mg Oral Q8H  . insulin aspart  0-15 Units Subcutaneous TID WC  . insulin aspart  3 Units Subcutaneous TID WC  . insulin glargine  25 Units Subcutaneous Q2200  . isosorbide mononitrate  30 mg Oral Daily  . pantoprazole  40 mg Oral Q1200  . rivaroxaban  20 mg Oral Q supper   Continuous Infusions:  PRN Meds: sodium chloride, ipratropium-albuterol   Vital Signs    Vitals:   06/09/16 1227 06/09/16 2105 06/10/16 0509 06/10/16 1400  BP: 119/80 (!) 105/91 123/87 (!) 105/92  Pulse: 94 78 71 98  Resp: 18  20 18   Temp: 97.8 F (36.6 C) 98.2 F (36.8 C) 97.8 F (36.6 C) 98.2 F (36.8 C)  TempSrc: Oral Oral Oral Oral  SpO2: 98% 96% 95% 100%  Weight:   234 lb 8 oz (106.4 kg)   Height:        Intake/Output Summary (Last 24 hours) at 06/10/16 1449 Last data filed at 06/10/16 1401  Gross per 24 hour  Intake             1040 ml  Output              800 ml  Net              240 ml   Filed Weights   06/08/16 0417 06/09/16 0350 06/10/16 0509  Weight: 234 lb 3.2 oz (106.2 kg) 234 lb 4.8 oz (106.3 kg) 234 lb 8 oz (106.4 kg)    Telemetry    NSR - Personally Reviewed  Physical Exam   GEN: No acute distress. Moderately obese  Neck: No JVD Cardiac: RRR, no murmurs, rubs, or gallops.  Respiratory: Clear to auscultation bilaterally. GI: Soft, nontender, non-distended  MS: s/p bilateral BKAs  Neuro:  Nonfocal  Psych: Normal affect   Labs    Chemistry Recent Labs Lab 06/04/16 0508  06/07/16 0357 06/08/16 0606 06/09/16 0449  NA 137  < > 134* 137 135  K 3.9  < > 3.9 3.2* 3.5  CL 94*  < > 92*  95* 97*  CO2 32  < > 30 33* 30  GLUCOSE 156*  < > 295* 131* 137*  BUN 11  < > 14 14 14   CREATININE 1.56*  < > 1.64* 1.36* 1.49*  CALCIUM 8.5*  < > 8.6* 8.9 9.0  PROT 6.5  --   --   --   --   ALBUMIN 2.7*  --   --   --   --   AST 32  --   --   --   --   ALT 29  --   --   --   --   ALKPHOS 180*  --   --   --   --   BILITOT 1.5*  --   --   --   --   GFRNONAA 44*  < > 41* 52* 46*  GFRAA 51*  < > 48* 60* 53*  ANIONGAP 11  < > 12 9 8   < > =  values in this interval not displayed.   Hematology Recent Labs Lab 06/07/16 0357 06/08/16 0606 06/09/16 0449  WBC 8.2 7.1 6.9  RBC 4.54 4.58 4.64  HGB 12.5* 12.6* 12.8*  HCT 39.6 40.1 40.5  MCV 87.2 87.6 87.3  MCH 27.5 27.5 27.6  MCHC 31.6 31.4 31.6  RDW 15.8* 15.9* 16.0*  PLT 189 189 205    Cardiac EnzymesNo results for input(s): TROPONINI in the last 168 hours. No results for input(s): TROPIPOC in the last 168 hours.   BNP Recent Labs Lab 06/05/16 1154 06/07/16 0357  BNP 619.3* 457.2*     DDimer No results for input(s): DDIMER in the last 168 hours.   Radiology    No results found.  Cardiac Studies   2D Echo 07/03/16 Study Conclusions  - Left ventricle: The cavity size was mildly dilated. Wall   thickness was normal. The estimated ejection fraction was 10%.   Diffuse hypokinesis. No apical thrombus with Definity contrast.   The study is not technically sufficient to allow evaluation of LV   diastolic function. - Mitral valve: Mildly thickened leaflets . There was mild   regurgitation. - Left atrium: Severely dilated. - Right ventricle: The cavity size was moderately dilated.   Moderately reduced systolic function. - Right atrium: The atrium was normal in size. - Tricuspid valve: There was moderate regurgitation. - Pulmonary arteries: PA peak pressure: 68 mm Hg (S). - Inferior vena cava: The vessel was normal in size. The   respirophasic diameter changes were in the normal range (>= 50%),   consistent with normal  central venous pressure.  Impressions:  - Compared to a prior study in 01/2016, there has been no   significant change.  Patient Profile     71 y.o. male  with past medical history significant for CKD, atrial flutter anticoagualted with Xarelto, DM, bilateral BKA, ischemic cardiomyopathy with EF 10-15% per echo in 02/2016, CAD s/p CABG in 2001 in [place], NSTEMI 02/2016 who was found unresponsive by roommate. ABG showed acute hypercarbic respiratory acidosis. CXR showed pulmonary edema. He was admitted with acute encephalopathy.  Assessment & Plan    1. Acute on Chronic systolic heart failure -EF in 81/1914 was 10-15%-no change this adm.- Although his ejection fraction has fallen over the past year,  -Metoprolol has been switched to coreg. No ACE-I/ARB due to low BP and elevated SCr -Was diuresed with lasix 80 mg TID, now on Lasix 80 mg PO daily. Wt down from 288 lbs to 234lbs. I&O net negative 25L since admission. Continues to diureses well with oral dosing  - Per Dr. Shirlee Latch, he is not a LVAD candidate. In addition, no role for Milrinone infusion since we have been able to diurese him and symptomatically he is feeling better.  2. Syncope -With severe diastolic dysfunction this could have been related to arrhythmia, but no arrhythmias noted since admission and he responded to Narcan in the ED. He has maintained sinus rhythm with PVC's.    3. Respiratory failure -Pt hypoxic and hypercapneic on admission. Improved with Narcan. Now diuresed over 50 pounds of fluid. Breathing and edema better.   4. CAD -S/P CABG 2001. NSTEMI and occluded LIMA to LAD- PTCA to LAD in 02/2016 with only modest improvement. He has known Occlusion of the native RCA and occlusion of the SVG to RCA. -Medical therapy includes Plavix, beta blocker, statin -Troponins 0.04, 0.05. Seems to be chronically mildly elevated (0.09 in 03/2016) -No chest pain at time of presentation or since  then -Continue beta blocker,  Plavix, statin. ARB is on hold due to renal function.   5. Paroxysmal atrial fibrillation -Maintaining sinus rhythm on metoprolol -CHA2DS2-VASc Score is 6 (CHF, age, stroke (2), DM, Vascular disease). He takes Xarelto for stroke risk reduction  Dispo: plan is for d/c to SNF. He appears stable from a cardiac standpoint.    Signed, Robbie Lis, PA-C  06/10/2016, 2:49 PM    Personally seen and examined. Agree with above. Ready for DC to SNF Stable CAD. Narcan responsive syncope. Not LVAD candidate.  Stage D HF Poor prognosis.  Alert, lungs clear, obese, Chronic edema  Donato Schultz, MD

## 2016-06-11 DIAGNOSIS — J40 Bronchitis, not specified as acute or chronic: Secondary | ICD-10-CM | POA: Diagnosis not present

## 2016-06-11 DIAGNOSIS — I70203 Unspecified atherosclerosis of native arteries of extremities, bilateral legs: Secondary | ICD-10-CM | POA: Diagnosis not present

## 2016-06-11 DIAGNOSIS — N189 Chronic kidney disease, unspecified: Secondary | ICD-10-CM | POA: Diagnosis not present

## 2016-06-11 DIAGNOSIS — R0600 Dyspnea, unspecified: Secondary | ICD-10-CM | POA: Diagnosis not present

## 2016-06-11 DIAGNOSIS — Z955 Presence of coronary angioplasty implant and graft: Secondary | ICD-10-CM | POA: Diagnosis not present

## 2016-06-11 DIAGNOSIS — I255 Ischemic cardiomyopathy: Secondary | ICD-10-CM | POA: Diagnosis not present

## 2016-06-11 DIAGNOSIS — Z899 Acquired absence of limb, unspecified: Secondary | ICD-10-CM | POA: Diagnosis not present

## 2016-06-11 DIAGNOSIS — Z87891 Personal history of nicotine dependence: Secondary | ICD-10-CM | POA: Diagnosis not present

## 2016-06-11 DIAGNOSIS — R278 Other lack of coordination: Secondary | ICD-10-CM | POA: Diagnosis not present

## 2016-06-11 DIAGNOSIS — R293 Abnormal posture: Secondary | ICD-10-CM | POA: Diagnosis not present

## 2016-06-11 DIAGNOSIS — T8789 Other complications of amputation stump: Secondary | ICD-10-CM | POA: Diagnosis not present

## 2016-06-11 DIAGNOSIS — R05 Cough: Secondary | ICD-10-CM | POA: Diagnosis not present

## 2016-06-11 DIAGNOSIS — I739 Peripheral vascular disease, unspecified: Secondary | ICD-10-CM | POA: Diagnosis not present

## 2016-06-11 DIAGNOSIS — E11622 Type 2 diabetes mellitus with other skin ulcer: Secondary | ICD-10-CM | POA: Diagnosis not present

## 2016-06-11 DIAGNOSIS — I5022 Chronic systolic (congestive) heart failure: Secondary | ICD-10-CM | POA: Diagnosis not present

## 2016-06-11 DIAGNOSIS — Z8673 Personal history of transient ischemic attack (TIA), and cerebral infarction without residual deficits: Secondary | ICD-10-CM | POA: Diagnosis not present

## 2016-06-11 DIAGNOSIS — I5043 Acute on chronic combined systolic (congestive) and diastolic (congestive) heart failure: Secondary | ICD-10-CM | POA: Diagnosis not present

## 2016-06-11 DIAGNOSIS — E785 Hyperlipidemia, unspecified: Secondary | ICD-10-CM | POA: Diagnosis not present

## 2016-06-11 DIAGNOSIS — J9601 Acute respiratory failure with hypoxia: Secondary | ICD-10-CM | POA: Diagnosis not present

## 2016-06-11 DIAGNOSIS — I130 Hypertensive heart and chronic kidney disease with heart failure and stage 1 through stage 4 chronic kidney disease, or unspecified chronic kidney disease: Secondary | ICD-10-CM | POA: Diagnosis not present

## 2016-06-11 DIAGNOSIS — L97812 Non-pressure chronic ulcer of other part of right lower leg with fat layer exposed: Secondary | ICD-10-CM | POA: Diagnosis not present

## 2016-06-11 DIAGNOSIS — L97822 Non-pressure chronic ulcer of other part of left lower leg with fat layer exposed: Secondary | ICD-10-CM | POA: Diagnosis not present

## 2016-06-11 DIAGNOSIS — Z89512 Acquired absence of left leg below knee: Secondary | ICD-10-CM | POA: Diagnosis not present

## 2016-06-11 DIAGNOSIS — Z951 Presence of aortocoronary bypass graft: Secondary | ICD-10-CM | POA: Diagnosis not present

## 2016-06-11 DIAGNOSIS — I7025 Atherosclerosis of native arteries of other extremities with ulceration: Secondary | ICD-10-CM | POA: Diagnosis not present

## 2016-06-11 DIAGNOSIS — R531 Weakness: Secondary | ICD-10-CM | POA: Diagnosis not present

## 2016-06-11 DIAGNOSIS — E119 Type 2 diabetes mellitus without complications: Secondary | ICD-10-CM | POA: Diagnosis not present

## 2016-06-11 DIAGNOSIS — J9600 Acute respiratory failure, unspecified whether with hypoxia or hypercapnia: Secondary | ICD-10-CM | POA: Diagnosis not present

## 2016-06-11 DIAGNOSIS — L97222 Non-pressure chronic ulcer of left calf with fat layer exposed: Secondary | ICD-10-CM | POA: Diagnosis not present

## 2016-06-11 DIAGNOSIS — I2510 Atherosclerotic heart disease of native coronary artery without angina pectoris: Secondary | ICD-10-CM | POA: Diagnosis not present

## 2016-06-11 DIAGNOSIS — N183 Chronic kidney disease, stage 3 (moderate): Secondary | ICD-10-CM | POA: Diagnosis not present

## 2016-06-11 DIAGNOSIS — L97828 Non-pressure chronic ulcer of other part of left lower leg with other specified severity: Secondary | ICD-10-CM | POA: Diagnosis not present

## 2016-06-11 DIAGNOSIS — I4891 Unspecified atrial fibrillation: Secondary | ICD-10-CM | POA: Diagnosis not present

## 2016-06-11 DIAGNOSIS — I5040 Unspecified combined systolic (congestive) and diastolic (congestive) heart failure: Secondary | ICD-10-CM | POA: Diagnosis not present

## 2016-06-11 DIAGNOSIS — I998 Other disorder of circulatory system: Secondary | ICD-10-CM | POA: Diagnosis not present

## 2016-06-11 DIAGNOSIS — I10 Essential (primary) hypertension: Secondary | ICD-10-CM | POA: Diagnosis not present

## 2016-06-11 DIAGNOSIS — Z9114 Patient's other noncompliance with medication regimen: Secondary | ICD-10-CM | POA: Diagnosis not present

## 2016-06-11 DIAGNOSIS — L97818 Non-pressure chronic ulcer of other part of right lower leg with other specified severity: Secondary | ICD-10-CM | POA: Diagnosis not present

## 2016-06-11 DIAGNOSIS — I70209 Unspecified atherosclerosis of native arteries of extremities, unspecified extremity: Secondary | ICD-10-CM | POA: Diagnosis not present

## 2016-06-11 DIAGNOSIS — E1142 Type 2 diabetes mellitus with diabetic polyneuropathy: Secondary | ICD-10-CM | POA: Diagnosis not present

## 2016-06-11 DIAGNOSIS — I4892 Unspecified atrial flutter: Secondary | ICD-10-CM | POA: Diagnosis not present

## 2016-06-11 DIAGNOSIS — R1311 Dysphagia, oral phase: Secondary | ICD-10-CM | POA: Diagnosis not present

## 2016-06-11 DIAGNOSIS — K449 Diaphragmatic hernia without obstruction or gangrene: Secondary | ICD-10-CM | POA: Diagnosis not present

## 2016-06-11 DIAGNOSIS — Z89511 Acquired absence of right leg below knee: Secondary | ICD-10-CM | POA: Diagnosis not present

## 2016-06-11 DIAGNOSIS — E1151 Type 2 diabetes mellitus with diabetic peripheral angiopathy without gangrene: Secondary | ICD-10-CM | POA: Diagnosis not present

## 2016-06-11 DIAGNOSIS — N182 Chronic kidney disease, stage 2 (mild): Secondary | ICD-10-CM | POA: Diagnosis not present

## 2016-06-11 DIAGNOSIS — X58XXXA Exposure to other specified factors, initial encounter: Secondary | ICD-10-CM | POA: Diagnosis not present

## 2016-06-11 DIAGNOSIS — Z86718 Personal history of other venous thrombosis and embolism: Secondary | ICD-10-CM | POA: Diagnosis not present

## 2016-06-11 DIAGNOSIS — E1121 Type 2 diabetes mellitus with diabetic nephropathy: Secondary | ICD-10-CM | POA: Diagnosis not present

## 2016-06-11 DIAGNOSIS — I480 Paroxysmal atrial fibrillation: Secondary | ICD-10-CM | POA: Diagnosis not present

## 2016-06-11 DIAGNOSIS — I252 Old myocardial infarction: Secondary | ICD-10-CM | POA: Diagnosis not present

## 2016-06-11 DIAGNOSIS — I509 Heart failure, unspecified: Secondary | ICD-10-CM | POA: Diagnosis not present

## 2016-06-11 DIAGNOSIS — R0602 Shortness of breath: Secondary | ICD-10-CM | POA: Diagnosis not present

## 2016-06-11 DIAGNOSIS — E1122 Type 2 diabetes mellitus with diabetic chronic kidney disease: Secondary | ICD-10-CM | POA: Diagnosis not present

## 2016-06-11 DIAGNOSIS — I5023 Acute on chronic systolic (congestive) heart failure: Secondary | ICD-10-CM | POA: Diagnosis not present

## 2016-06-11 LAB — GLUCOSE, CAPILLARY
GLUCOSE-CAPILLARY: 216 mg/dL — AB (ref 65–99)
Glucose-Capillary: 110 mg/dL — ABNORMAL HIGH (ref 65–99)
Glucose-Capillary: 323 mg/dL — ABNORMAL HIGH (ref 65–99)

## 2016-06-11 NOTE — Progress Notes (Signed)
BP stable this am Pt medically stable for discharge to SNF today Please refer to discharge summary completed 06/10/2016 No changes in meds since 06/10/2016  Manson Passey [facility] [phone]

## 2016-06-11 NOTE — Clinical Social Work Placement (Signed)
   CLINICAL SOCIAL WORK PLACEMENT  NOTE  Date:  06/11/2016  Patient Details  Name: Leroy [name] MRN: [id number] Date of Birth: 22-Feb-1946  Clinical Social Work is seeking post-discharge placement for this patient at the Skilled  Nursing Facility level of care (*CSW will initial, date and re-position this form in  chart as items are completed):  Yes   Patient/family provided with [place] Clinical Social Work Department's list of facilities offering this level of care within the geographic area requested by the patient (or if unable, by the patient's family).  Yes   Patient/family informed of their freedom to choose among providers that offer the needed level of care, that participate in Medicare, Medicaid or managed care program needed by the patient, have an available bed and are willing to accept the patient.  Yes   Patient/family informed of [place]'s ownership interest in [facility] and [facility], as well as of the fact that they are under no obligation to receive care at these facilities.  PASRR submitted to EDS on 06/04/16     PASRR number received on       Existing PASRR number confirmed on 06/04/16     FL2 transmitted to all facilities in geographic area requested by pt/family on 06/04/16     FL2 transmitted to all facilities within larger geographic area on       Patient informed that his/her managed care company has contracts with or will negotiate with certain facilities, including the following:        Yes   Patient/family informed of bed offers received.  Patient chooses bed at [facility]     Physician recommends and patient chooses bed at      Patient to be transferred to [facility] on 06/11/16.  Patient to be transferred to facility by [facility]     Patient family notified on 06/11/16 of transfer.  Name of family member notified:  Ellery Plunk     PHYSICIAN Please prepare prescriptions     Additional  Comment:    _______________________________________________ Margarito Liner, LCSW 06/11/2016, 5:24 PM

## 2016-06-11 NOTE — Progress Notes (Signed)
Physical Therapy Treatment Patient Details Name: Leroy [name] MRN: [id number] DOB: 03/03/1946 Today's Date: 06/11/2016    History of Present Illness pt presents with Respiratory Failure, HF, and AMS.  pt with hx of DM, HF, CAD, CABG, CKD, Bil BKAs, DVT, MI, Neuropathy, PVD, and CVA.      PT Comments    Pt able to perform transfer from bed to chair using lateral scoot technique with min guard assistance. Pt agreeable to PT session and reports that he wants to be able to walk again. Recommending D/C to SNF following acute stay.    Follow Up Recommendations  SNF     Equipment Recommendations  None recommended by PT    Recommendations for Other Services       Precautions / Restrictions Precautions Precautions: Fall Restrictions Weight Bearing Restrictions: No    Mobility  Bed Mobility Overal bed mobility: Needs Assistance Bed Mobility: Supine to Sit     Supine to sit: Supervision     General bed mobility comments: Pt using bilateral rails to come to long sitting  Transfers Overall transfer level: Needs assistance Equipment used: None Transfers: Lateral/Scoot Transfers          Lateral/Scoot Transfers: Min guard General transfer comment: Using drop arm recliner for lateral scoot. Pt taking rest break between scoot attempts.   Ambulation/Gait                 Stairs            Wheelchair Mobility    Modified Rankin (Stroke Patients Only)       Balance Overall balance assessment: Needs assistance Sitting-balance support: No upper extremity supported Sitting balance-Leahy Scale: Fair                              Cognition Arousal/Alertness: Awake/alert Behavior During Therapy: Flat affect Overall Cognitive Status: No family/caregiver present to determine baseline cognitive functioning                      Exercises      General Comments General comments (skin integrity, edema, etc.): Chair press up X2       Pertinent Vitals/Pain Pain Assessment: Faces Faces Pain Scale: Hurts little more Pain Location: all over Pain Descriptors / Indicators: Aching Pain Intervention(s): Limited activity within patient's tolerance;Monitored during session    Home Living                      Prior Function            PT Goals (current goals can now be found in the care plan section) Acute Rehab PT Goals Patient Stated Goal: be able to walk PT Goal Formulation: With patient Time For Goal Achievement: 06/18/16 Potential to Achieve Goals: Good Progress towards PT goals: Progressing toward goals    Frequency    Min 2X/week      PT Plan Current plan remains appropriate    Co-evaluation             End of Session Equipment Utilized During Treatment: Oxygen Activity Tolerance: Patient tolerated treatment well Patient left: in chair;with call bell/phone within reach;with chair alarm set Nurse Communication: Mobility status PT Visit Diagnosis: Muscle weakness (generalized) (M62.81)     Time: 1747-1595 PT Time Calculation (min) (ACUTE ONLY): 17 min  Charges:  $Therapeutic Activity: 8-22 mins  G Codes:       Christiane Ha, PT, CSCS Pager [phone] Office [phone]  06/11/2016, 9:55 AM

## 2016-06-11 NOTE — Clinical Social Work Note (Addendum)
CSW faxed clinicals to [facility] for review.  Charlynn Court, CSW [phone]  11:31 am Patient now fully oriented as opposed to when he was initially worked up for SNF. Patient is agreeable to SNF placement at [facility], stating "Whatever [name] wants." CSW called and left patient's stepdaughter a voicemail. Will discuss her going to facility to do paperwork upon call back. She told CSW she gets off work everyday at 3:30 last week.  Charlynn Court, CSW [phone]  11:52 am Patient's step-daughter called back. She initially stated that she could not go to the facility to do the patient's paperwork today because she has to pick up her grandchildren but CSW explained that he cannot discharge there until it is complete. Patient's step-daughter will make other arrangements and call CSW back. She stated that 4:00 pm should be fine.  Charlynn Court, CSW [phone]  1:44 pm CSW got a phone call from [facility] confirming that they received clinicals and they had been forwarded to a nurse for review.  Charlynn Court, CSW [phone]  4:03 pm Representative at [facility] said we should have authorization in about 20 minutes.  Charlynn Court, CSW [phone]  4:33 pm Insurance authorization obtained: [id number]. SNF notified.  Charlynn Court, CSW [phone]

## 2016-06-11 NOTE — Progress Notes (Signed)
Called [facility] at [phone], and gave receiving nurse, Lupita Leash, report.   Pt is in no acute distress, IV removed without complication, cardiac monitoring removed and CCMD notified.  [facility] will be transporting.

## 2016-06-11 NOTE — Progress Notes (Signed)
Physical Therapy Treatment Patient Details Name: SADAO WEYER MRN: [id number] DOB: 05-Aug-1945 Today's Date: 06/11/2016    History of Present Illness pt presents with Respiratory Failure, HF, and AMS.  pt with hx of DM, HF, CAD, CABG, CKD, Bil BKAs, DVT, MI, Neuropathy, PVD, and CVA.      PT Comments    Pt up in chair upon arrival and requesting return to bed. Pt needed increased cues for technique to return to bed including sequence and safety. PT noting that the pt is slow to respond to cues but able to follow commands. Continuing to recommend SNF following acute stay. Pt left in care of nursing following session.    Follow Up Recommendations  SNF     Equipment Recommendations  None recommended by PT    Recommendations for Other Services       Precautions / Restrictions Precautions Precautions: Fall Restrictions Weight Bearing Restrictions: No    Mobility  Bed Mobility Overal bed mobility: Needs Assistance Bed Mobility: Rolling Rolling: Min assist         General bed mobility comments: using rail to assist  Transfers Overall transfer level: Needs assistance Equipment used: None Transfers: Lateral/Scoot Transfers          Lateral/Scoot Transfers: Min guard General transfer comment: Pt needing cues for sequenct to return to bed from chair with drop arm chair. Pt initially reaching across toward bed rail. Cues provided for pt to press into chair/bed for lateral scoot. Half way across the pt reached for opposite bed rail and rolled across onto his stomach onto the bed. Discussed safety considerations regarding this technique.   Ambulation/Gait                 Stairs            Wheelchair Mobility    Modified Rankin (Stroke Patients Only)       Balance Overall balance assessment: Needs assistance Sitting-balance support: No upper extremity supported;Single extremity supported Sitting balance-Leahy Scale: Fair                              Cognition Arousal/Alertness: Awake/alert Behavior During Therapy: Flat affect Overall Cognitive Status: No family/caregiver present to determine baseline cognitive functioning                 General Comments: Pt with delayed response to commands, decreased safety awareness.     Exercises      General Comments        Pertinent Vitals/Pain Pain Assessment: No/denies pain    Home Living                      Prior Function            PT Goals (current goals can now be found in the care plan section) Acute Rehab PT Goals Patient Stated Goal: get to bed.  PT Goal Formulation: With patient Time For Goal Achievement: 06/18/16 Potential to Achieve Goals: Good Progress towards PT goals: Progressing toward goals    Frequency    Min 2X/week      PT Plan Current plan remains appropriate    Co-evaluation             End of Session Equipment Utilized During Treatment: Oxygen Activity Tolerance: Patient tolerated treatment well Patient left: in bed;with call bell/phone within reach (with nursing) Nurse Communication: Mobility status PT Visit Diagnosis: Muscle weakness (generalized) (M62.81)  Time: 1610-9604 PT Time Calculation (min) (ACUTE ONLY): 17 min  Charges:  $Therapeutic Activity: 8-22 mins                    G Codes:       Christiane Ha, PT, CSCS Pager [phone] Office [phone]  06/11/2016, 4:23 PM

## 2016-06-12 DIAGNOSIS — N183 Chronic kidney disease, stage 3 (moderate): Secondary | ICD-10-CM | POA: Diagnosis not present

## 2016-06-12 DIAGNOSIS — I5023 Acute on chronic systolic (congestive) heart failure: Secondary | ICD-10-CM | POA: Diagnosis not present

## 2016-06-12 DIAGNOSIS — I4891 Unspecified atrial fibrillation: Secondary | ICD-10-CM | POA: Diagnosis not present

## 2016-06-12 DIAGNOSIS — I509 Heart failure, unspecified: Secondary | ICD-10-CM | POA: Diagnosis not present

## 2016-06-12 DIAGNOSIS — I2510 Atherosclerotic heart disease of native coronary artery without angina pectoris: Secondary | ICD-10-CM | POA: Diagnosis not present

## 2016-06-12 DIAGNOSIS — Z89512 Acquired absence of left leg below knee: Secondary | ICD-10-CM | POA: Diagnosis not present

## 2016-06-12 DIAGNOSIS — I255 Ischemic cardiomyopathy: Secondary | ICD-10-CM | POA: Diagnosis not present

## 2016-06-12 DIAGNOSIS — I480 Paroxysmal atrial fibrillation: Secondary | ICD-10-CM | POA: Diagnosis not present

## 2016-06-12 DIAGNOSIS — E119 Type 2 diabetes mellitus without complications: Secondary | ICD-10-CM | POA: Diagnosis not present

## 2016-06-12 DIAGNOSIS — I739 Peripheral vascular disease, unspecified: Secondary | ICD-10-CM | POA: Diagnosis not present

## 2016-06-13 DIAGNOSIS — I4891 Unspecified atrial fibrillation: Secondary | ICD-10-CM | POA: Diagnosis not present

## 2016-06-13 DIAGNOSIS — N183 Chronic kidney disease, stage 3 (moderate): Secondary | ICD-10-CM | POA: Diagnosis not present

## 2016-06-13 DIAGNOSIS — I2510 Atherosclerotic heart disease of native coronary artery without angina pectoris: Secondary | ICD-10-CM | POA: Diagnosis not present

## 2016-06-13 DIAGNOSIS — I5023 Acute on chronic systolic (congestive) heart failure: Secondary | ICD-10-CM | POA: Diagnosis not present

## 2016-06-17 ENCOUNTER — Inpatient Hospital Stay: Payer: Medicare HMO | Admitting: Internal Medicine

## 2016-06-19 ENCOUNTER — Ambulatory Visit: Payer: Medicare HMO | Admitting: Internal Medicine

## 2016-06-20 ENCOUNTER — Other Ambulatory Visit: Payer: Self-pay | Admitting: *Deleted

## 2016-06-20 NOTE — Patient Outreach (Signed)
[facility]) Care Management  06/20/2016  Leroy [name] 09/08/1945 [id number]   Met with patient at facility. Patient lying in bed, he states he is doing ok, he is going to wound care center tomorrow to look at wounds on legs.  Patient states he is familiar with [facility] services.  He states his daughter, [name] is his representative.  He states he will review the [facility] program brochure with daughter. RNCM left Contact information.  Plan RNCM will follow up at next facility visit.  Royetta Crochet. Laymond Purser, RN, BSN, [place] [phone]) Business Cell  [phone]) Toll Free Office

## 2016-06-21 ENCOUNTER — Encounter (HOSPITAL_BASED_OUTPATIENT_CLINIC_OR_DEPARTMENT_OTHER): Payer: Medicare HMO | Attending: Internal Medicine

## 2016-06-21 ENCOUNTER — Encounter (HOSPITAL_BASED_OUTPATIENT_CLINIC_OR_DEPARTMENT_OTHER): Payer: Self-pay

## 2016-06-21 ENCOUNTER — Encounter: Payer: Self-pay | Admitting: Vascular Surgery

## 2016-06-21 ENCOUNTER — Ambulatory Visit (INDEPENDENT_AMBULATORY_CARE_PROVIDER_SITE_OTHER): Payer: Medicare HMO | Admitting: Vascular Surgery

## 2016-06-21 VITALS — BP 98/52 | HR 98 | Temp 97.5°F | Resp 24 | Wt 234.0 lb

## 2016-06-21 DIAGNOSIS — L97828 Non-pressure chronic ulcer of other part of left lower leg with other specified severity: Secondary | ICD-10-CM | POA: Insufficient documentation

## 2016-06-21 DIAGNOSIS — I2510 Atherosclerotic heart disease of native coronary artery without angina pectoris: Secondary | ICD-10-CM | POA: Insufficient documentation

## 2016-06-21 DIAGNOSIS — Z951 Presence of aortocoronary bypass graft: Secondary | ICD-10-CM | POA: Insufficient documentation

## 2016-06-21 DIAGNOSIS — I480 Paroxysmal atrial fibrillation: Secondary | ICD-10-CM | POA: Insufficient documentation

## 2016-06-21 DIAGNOSIS — E1151 Type 2 diabetes mellitus with diabetic peripheral angiopathy without gangrene: Secondary | ICD-10-CM | POA: Diagnosis not present

## 2016-06-21 DIAGNOSIS — L97222 Non-pressure chronic ulcer of left calf with fat layer exposed: Secondary | ICD-10-CM | POA: Diagnosis not present

## 2016-06-21 DIAGNOSIS — Z89511 Acquired absence of right leg below knee: Secondary | ICD-10-CM | POA: Insufficient documentation

## 2016-06-21 DIAGNOSIS — I7025 Atherosclerosis of native arteries of other extremities with ulceration: Secondary | ICD-10-CM

## 2016-06-21 DIAGNOSIS — I252 Old myocardial infarction: Secondary | ICD-10-CM | POA: Insufficient documentation

## 2016-06-21 DIAGNOSIS — E1122 Type 2 diabetes mellitus with diabetic chronic kidney disease: Secondary | ICD-10-CM | POA: Diagnosis not present

## 2016-06-21 DIAGNOSIS — L97818 Non-pressure chronic ulcer of other part of right lower leg with other specified severity: Secondary | ICD-10-CM | POA: Diagnosis not present

## 2016-06-21 DIAGNOSIS — E11622 Type 2 diabetes mellitus with other skin ulcer: Secondary | ICD-10-CM | POA: Insufficient documentation

## 2016-06-21 DIAGNOSIS — Z87891 Personal history of nicotine dependence: Secondary | ICD-10-CM | POA: Insufficient documentation

## 2016-06-21 DIAGNOSIS — N182 Chronic kidney disease, stage 2 (mild): Secondary | ICD-10-CM | POA: Insufficient documentation

## 2016-06-21 DIAGNOSIS — Z89512 Acquired absence of left leg below knee: Secondary | ICD-10-CM | POA: Diagnosis not present

## 2016-06-21 DIAGNOSIS — L97812 Non-pressure chronic ulcer of other part of right lower leg with fat layer exposed: Secondary | ICD-10-CM | POA: Diagnosis not present

## 2016-06-21 DIAGNOSIS — I5022 Chronic systolic (congestive) heart failure: Secondary | ICD-10-CM | POA: Diagnosis not present

## 2016-06-21 DIAGNOSIS — L97822 Non-pressure chronic ulcer of other part of left lower leg with fat layer exposed: Secondary | ICD-10-CM | POA: Diagnosis not present

## 2016-06-21 DIAGNOSIS — Z86718 Personal history of other venous thrombosis and embolism: Secondary | ICD-10-CM | POA: Insufficient documentation

## 2016-06-21 NOTE — Progress Notes (Signed)
Established Critical Limb Ischemia Patient  History of Present Illness  Leroy [name] is a 71 y.o. (24-Aug-1945) male  who presents with chief complaint: wounds in both BKA stumps.  The patient is being seen by wound care.  The wound care physician thinks this patient has compromised inflow to both BKA.  The patient current moves in a wheelchair and does not use his prosthesis.  He is not exactly certain of chronology of his disease.  The patient's PMH, PSH, SH, FamHx, Med, and Allergies are unchanged from 09/02/14  Current Outpatient Prescriptions  Medication Sig Dispense Refill  . albuterol (PROVENTIL HFA;VENTOLIN HFA) 108 (90 Base) MCG/ACT inhaler Inhale 2 puffs into the lungs every 6 (six) hours as needed for wheezing or shortness of breath. 1 Inhaler 0  . benzonatate (TESSALON) 100 MG capsule Take 100 mg by mouth 3 (three) times daily as needed for cough.    . carvedilol (COREG) 3.125 MG tablet Take 1 tablet (3.125 mg total) by mouth 2 (two) times daily with a meal. 60 tablet 0  . clopidogrel (PLAVIX) 75 MG tablet Take 1 tablet (75 mg total) by mouth daily. 90 tablet 1  . docusate sodium (COLACE) 100 MG capsule Take 1 capsule (100 mg total) by mouth 2 (two) times daily. 10 capsule 0  . furosemide (LASIX) 40 MG tablet Take 1 tablet (40 mg total) by mouth daily. 30 tablet 0  . hydrALAZINE (APRESOLINE) 25 MG tablet Take 1.5 tablets (37.5 mg total) by mouth every 8 (eight) hours. 90 tablet 0  . insulin aspart (NOVOLOG) 100 UNIT/ML injection Inject 0-15 Units into the skin 3 (three) times daily with meals. 10 mL 11  . insulin aspart (NOVOLOG) 100 UNIT/ML injection Inject 3 Units into the skin 3 (three) times daily with meals. 10 mL 11  . insulin glargine (LANTUS) 100 UNIT/ML injection Inject 0.25 mLs (25 Units total) into the skin daily at 10 pm. 10 mL 11  . ipratropium-albuterol (DUONEB) 0.5-2.5 (3) MG/3ML SOLN Take 3 mLs by nebulization every 4 (four) hours as needed. 360 mL 0  .  isosorbide mononitrate (IMDUR) 30 MG 24 hr tablet Take 1 tablet (30 mg total) by mouth daily. 30 tablet 0  . pantoprazole (PROTONIX) 40 MG tablet Take 1 tablet (40 mg total) by mouth daily at 12 noon. 30 tablet 0  . potassium chloride (K-DUR) 10 MEQ tablet Take 1 tablet (10 mEq total) by mouth daily. 30 tablet 0  . rivaroxaban (XARELTO) 20 MG TABS tablet Take 1 tablet (20 mg total) by mouth daily with supper. 30 tablet 0  . rosuvastatin (CRESTOR) 20 MG tablet Take 20 mg by mouth daily.     No current facility-administered medications for this visit.    On ROS today: drainage from both BKA, no fever or chills  Physical Examination  Vitals:   06/21/16 1444  BP: (!) 98/52  Pulse: 98  Resp: (!) 24  Temp: 97.5 F (36.4 C)  TempSrc: Oral  SpO2: 93%  Weight: 234 lb (106.1 kg)    General: somewhat confused, WDWN  Eyes: PERRL, EOMI  Pulmonary: Sym exp, good air movt, CTAB, no rales, rhonchi, & wheezing  Cardiac: RRR, Nl S1, S2, no Murmurs, rubs or gallops  Vascular: Vessel Right Left  Radial Palpable Palpable  Brachial Palpable Palpable  Carotid Palpable, without bruit Palpable, without bruit  Aorta Not palpable N/A  Femoral Not Palpable due to position Not Palpable due to position  Popliteal Not palpable Not palpable  PT BKA BKA  DP BKA BKA   Gastrointestinal: soft, NTND, -G/R, - HSM, - masses, - CVAT B  Musculoskeletal: M/S 5/5 throughout BUE, able to move both BKA spontaneous, new dressings on both BKA, visible tissue is cold to touch  Neurologic: Pain and light touch intact in extremities , Motor exam as listed above   Medical Decision Making  Leroy [name] is a 71 y.o. ([birth year]) male who presents with: ischemic B BKA   Pt probably needs B AKA.  Would get an aortoiliac duplex and B arterial duplex to determine if he has intact profunda flow and intact aortoiliac flow.  If pt has intact aortoiliac and profunda flow, he should be able to heal B  AKA.  Otherwise, a hip disarticulation on the side missing profunda or iliac flow would be needed.  In this last scenario, comfort measures might be necessary as hip disarticulations carry a nearly 100% mortality rate.  Thank you for allowing [place] to participate in this patient's care.   [name], MD, FACS [facility] Office: [phone] Pager: [phone]  06/21/2016, 4:55 PM

## 2016-06-24 DIAGNOSIS — I998 Other disorder of circulatory system: Secondary | ICD-10-CM | POA: Diagnosis not present

## 2016-06-24 DIAGNOSIS — I2510 Atherosclerotic heart disease of native coronary artery without angina pectoris: Secondary | ICD-10-CM | POA: Diagnosis not present

## 2016-06-24 DIAGNOSIS — I5023 Acute on chronic systolic (congestive) heart failure: Secondary | ICD-10-CM | POA: Diagnosis not present

## 2016-06-24 DIAGNOSIS — E1121 Type 2 diabetes mellitus with diabetic nephropathy: Secondary | ICD-10-CM | POA: Diagnosis not present

## 2016-06-24 NOTE — Addendum Note (Signed)
Addended by: Burton Apley A on: 06/24/2016 10:28 AM   Modules accepted: Orders

## 2016-06-24 NOTE — Progress Notes (Signed)
Established Critical Limb Ischemia Patient  History of Present Illness  Leroy [name] is a 71 y.o. 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  The patient is being seen by wound care.  The wound care physician thinks this patient has compromised inflow to both BKA. The patient current moves in a wheelchair and does not use his prosthesis.  He is not exactly certain of chronology of his disease.  This pt returns from his recent appt for his vascular studies.   Past Medical History:  Diagnosis Date  . Acute respiratory failure with hypoxia and hypercapnia ([place]) 05/31/2016  . Atrial flutter ([place])   . Cataract   . Chronic kidney disease    on Lisinopril to protect kidneys d/t being on Metformin per pt  . Chronic systolic CHF (congestive heart failure) ([place])   . Coronary artery disease    a. s/p remote CABG 2001 at [facility] b. Cath 03/2010: for med rx.;  c.  Carlton Adam myoview (1/14):  Large Inferior apical MI, very small area of peri-infarct ischemia toward the inferior apical segment. EF 19%.  Med Rx was continued.    . Diabetes mellitus 1979   takes Metformni daily  and Lantus 50units bid  . DVT (deep venous thrombosis) ([place]) 2011   legs  . H/O hiatal hernia   . H/O medication noncompliance    Due to insurance issues  . History of blood clots    in legs--this was in 2011--has been off of Coumadin 21month;takes Xarelto daily  . Hyperlipidemia   . Hypotension   . Ischemic cardiomyopathy    a. EF 40% 2010. b. Echo (2/14):  mild LVH, EF 30-35%, diff HK, Gr 1 DD, MAC, mild MR, mod LAE, PASP 39  . Joint pain   . Myocardial infarction    total of 8 heart attacks;last one in 2011  . Paroxysmal atrial fibrillation (H[facility])   . Peripheral neuropathy ([place]   . Peripheral vascular disease ([place]    a. s/p L BKA 2014.  .Marland KitchenShortness of breath dyspnea   . Stroke ([facility]     Past Surgical History:  Procedure Laterality Date  . ABDOMINAL AORTAGRAM  04-30-12   . ABDOMINAL AORTAGRAM N/A 04/30/2012   Procedure: ABDOMINAL AMaxcine Ham  Surgeon: BConrad Lake Sherwood MD;  [facility];  Service: Cardiovascular;  Laterality: N/A;  . ADENOIDECTOMY    . AMPUTATION Left 06/03/2012   Procedure: AMPUTATION BELOW KNEE;  Surgeon: BConrad Yaphank MD;  [place]  Service: Vascular;  Laterality: Left;  . AMPUTATION Right 04/14/2013   Procedure: AMPUTATION BELOW KNEE ;  Surgeon: BConrad Onancock MD;  [place]  Service: Vascular;  Laterality: Right;  . APPLICATION OF WOUND VAC Right 04/20/2014   Procedure: APPLICATION OF WOUND VAC;  Surgeon: BConrad [place] Mount MD;  [place]  Service: Vascular;  Laterality: Right;  . CARDIAC CATHETERIZATION  03/19/10  . CARDIAC CATHETERIZATION N/A 02/22/2016   Procedure: Left Heart Cath and Cors/Grafts Angiography;  Surgeon: JJettie Booze MD;  [facility];  Service: Cardiovascular;  Laterality: N/A;  . CARDIAC CATHETERIZATION N/A 02/22/2016   Procedure: Coronary Stent Intervention;  Surgeon: JJettie Booze MD;  [facility];  Service: Cardiovascular;  Laterality: N/A;  . CARDIAC CATHETERIZATION    . COLONOSCOPY WITH PROPOFOL N/A 10/24/2015   Procedure: COLONOSCOPY WITH PROPOFOL;  Surgeon: DMilus Banister MD;  [facility];  Service:  Endoscopy;  Laterality: N/A;  . CORONARY ANGIOPLASTY  2002   3 stents  . CORONARY ARTERY BYPASS GRAFT  03/2000   quadruple  . ENDARTERECTOMY FEMORAL Right 03/25/2013   Procedure: ENDARTERECTOMY FEMORAL ;  Surgeon: Conrad Liberty, MD;  Location: [place];  Service: Vascular;  Laterality: Right;  . EYE SURGERY Right    Catarct  . HERNIA REPAIR     Hiatal Hernia  . I&D EXTREMITY Right 04/14/2013   Procedure: IRRIGATION AND DEBRIDEMENT OF RIGHT  GROIN & PLACEMENT OF VAC DRESSING;  Surgeon: Conrad Muscoda, MD;  Location: [place];  Service: Vascular;  Laterality: Right;  . I&D EXTREMITY Right 04/20/2014   Procedure: DEBRIDEMENT OF RIGHT BELOW KNEE  AMPUTATION STUMP;  Surgeon: Conrad San Miguel, MD;  Location: [place];  Service: Vascular;  Laterality: Right;  . LOWER EXTREMITY ANGIOGRAM Bilateral 04/30/2012   Procedure: LOWER EXTREMITY ANGIOGRAM;  Surgeon: Conrad [place], MD;  Location: [facility] CATH LAB;  Service: Cardiovascular;  Laterality: Bilateral;  bilat lower extrem angio  . PATCH ANGIOPLASTY Right 03/25/2013   Procedure: PATCH ANGIOPLASTY USING VASCU-GUARD PERIPHERAL VASCULAR PATCH;  Surgeon: Conrad Anchorage, MD;  Location: [place];  Service: Vascular;  Laterality: Right;  . revasculariztion  2011/2010   x 2   . TONSILLECTOMY    . WOUND DEBRIDEMENT Right 04/20/2014   s/p  rt bka  with application of wound vac    Social History   Social History  . Marital status: Legally Separated    Spouse name: N/A  . Number of children: N/A  . Years of education: N/A   Occupational History  . Not on file.   Social History Main Topics  . Smoking status: Former Smoker    Types: Cigarettes  . Smokeless tobacco: Never Used     Comment: quit smoking 54yr ago  . Alcohol use No  . Drug use: No  . Sexual activity: Not Currently   Other Topics Concern  . Not on file   Social History Narrative  . No narrative on file    Family History  Problem Relation Age of Onset  . Heart disease Mother     before age 70 . Diabetes Mother   . Heart attack Mother   . Stroke Mother   . Heart disease Father   . Colon cancer Neg Hx   . Stomach cancer Neg Hx   . Pancreatic cancer Neg Hx   . Esophageal cancer Neg Hx      Current Outpatient Prescriptions  Medication Sig Dispense Refill  . albuterol (PROVENTIL HFA;VENTOLIN HFA) 108 (90 Base) MCG/ACT inhaler Inhale 2 puffs into the lungs every 6 (six) hours as needed for wheezing or shortness of breath. 1 Inhaler 0  . benzonatate (TESSALON) 100 MG capsule Take 100 mg by mouth 3 (three) times daily as needed for cough.    . carvedilol (COREG) 3.125 MG tablet Take 1 tablet (3.125 mg total) by mouth 2 (two) times  daily with a meal. 60 tablet 0  . clopidogrel (PLAVIX) 75 MG tablet Take 1 tablet (75 mg total) by mouth daily. 90 tablet 1  . docusate sodium (COLACE) 100 MG capsule Take 1 capsule (100 mg total) by mouth 2 (two) times daily. 10 capsule 0  . furosemide (LASIX) 40 MG tablet Take 1 tablet (40 mg total) by mouth daily. 30 tablet 0  . hydrALAZINE (APRESOLINE) 25 MG tablet Take 1.5 tablets (37.5 mg total) by mouth every 8 (eight) hours. 90 tablet  0  . insulin aspart (NOVOLOG) 100 UNIT/ML injection Inject 0-15 Units into the skin 3 (three) times daily with meals. 10 mL 11  . insulin aspart (NOVOLOG) 100 UNIT/ML injection Inject 3 Units into the skin 3 (three) times daily with meals. 10 mL 11  . insulin glargine (LANTUS) 100 UNIT/ML injection Inject 0.25 mLs (25 Units total) into the skin daily at 10 pm. 10 mL 11  . ipratropium-albuterol (DUONEB) 0.5-2.5 (3) MG/3ML SOLN Take 3 mLs by nebulization every 4 (four) hours as needed. 360 mL 0  . isosorbide mononitrate (IMDUR) 30 MG 24 hr tablet Take 1 tablet (30 mg total) by mouth daily. 30 tablet 0  . pantoprazole (PROTONIX) 40 MG tablet Take 1 tablet (40 mg total) by mouth daily at 12 noon. 30 tablet 0  . potassium chloride (K-DUR) 10 MEQ tablet Take 1 tablet (10 mEq total) by mouth daily. 30 tablet 0  . rosuvastatin (CRESTOR) 20 MG tablet Take 20 mg by mouth daily.    . rivaroxaban (XARELTO) 20 MG TABS tablet Take 1 tablet (20 mg total) by mouth daily with supper. (Patient not taking: Reported on 06/28/2016) 30 tablet 0   No current facility-administered medications for this visit.      Allergies  Allergen Reactions  . Statins Other (See Comments)    Reaction:  Muscle pain   . Amlodipine Besylate Other (See Comments)    Reaction:  Muscle pain   . Cephalexin Hives     REVIEW OF SYSTEMS:  (Positives checked otherwise negative)  CARDIOVASCULAR:   _0  chest pain,  _1  chest pressure,  _2  palpitations,  _3  shortness of breath when laying flat,    _4  shortness of breath with exertion,   _5  pain in feet when walking,  _6  pain in feet when laying flat, _7  history of blood clot in veins (DVT),  _8  history of phlebitis,  _9  swelling in legs,  _10  varicose veins  PULMONARY:   _11  productive cough,  _12  asthma,  _13  wheezing  NEUROLOGIC:   _14  weakness in arms or legs,  _15  numbness in arms or legs,  _16  difficulty speaking or slurred speech,  _17  temporary loss of vision in one eye,  _18  dizziness  HEMATOLOGIC:   _19  bleeding problems,  _20  problems with blood clotting too easily  MUSCULOSKEL:   _21  joint pain, _22  joint swelling  GASTROINTEST:   _23  vomiting blood,  _24  blood in stool     GENITOURINARY:   _25  burning with urination,  _26  blood in urine  PSYCHIATRIC:   _27  history of major depression  INTEGUMENTARY:   _28  rashes,  _29  ulcers  CONSTITUTIONAL:   _30  fever,  _31  chills   Physical Examination  Vitals:   06/28/16 0843  Pulse: (!) 104  Resp: 20  Temp: 97.4 F (36.3 C)  TempSrc: Oral  SpO2: 97%  Weight: 233 lb (105.7 kg)  Height: _32  (1.753 m)   BP could not be measured on any limb  General: alert, WDWN  Eyes: PERRL, EOMI  Pulmonary: Sym exp, good air movt, CTAB, no rales, rhonchi, & wheezing  Cardiac: RRR, Nl S1, S2, no Murmurs, rubs or gallops  Vascular: Vessel Right Left  Radial Palpable Palpable  Brachial Palpable Palpable  Carotid Palpable, without bruit Palpable, without bruit  Aorta Not palpable  N/A  Femoral Not Palpable due to position Not Palpable due to position  Popliteal Not palpable Not palpable  PT BKA BKA  DP BKA BKA   Gastrointestinal: soft, NTND, -G/R, - HSM, - masses, - CVAT B  Musculoskeletal: M/S 5/5 throughout BUE, able to move both BKA spontaneous, new dressings on both BKA, visible tissue is cold to touch  Neurologic: Pain and light touch intact in extremities , Motor exam as listed above  BLE arterial duplex  (06/25/16):  Sub-optimal study  R: mono flow in CFA , PFA, and SFA  L: mono flow in L CFA with hemodynamically significant stenosis in CFA and PFA, occluded SFA  Inflow disease suspected   Medical Decision Making  YOSHIMI SARR is a 71 y.o. (1945-08-04) male who presents with: ischemic B BKA   We discussed his options.  Pt elected proceeding with aortogram, bilateral iliac angiogram, possible intervention. I discussed with the patient the nature of angiographic procedures, especially the limited patencies of any endovascular intervention.   The patient is aware of that the risks of an angiographic procedure include but are not limited to: bleeding, infection, access site complications, renal failure, embolization, rupture of vessel, dissection, arteriovenous fistula, possible need for emergent surgical intervention, possible need for surgical procedures to treat the patient's pathology, anaphylactic reaction to contrast, and stroke and death.   The patient is aware of the risks and agrees to proceed.  He scheduled for this coming Monday 26 MAR 18  Thank you for allowing [place] to participate in this patient's care.   Adele Barthel, MD, FACS [facility] [place] Office: [phone] Pager: [phone]

## 2016-06-24 NOTE — Addendum Note (Signed)
Addended by: Burton Apley A on: 06/24/2016 09:17 AM   Modules accepted: Orders

## 2016-06-25 ENCOUNTER — Ambulatory Visit (HOSPITAL_COMMUNITY)
Admission: RE | Admit: 2016-06-25 | Discharge: 2016-06-25 | Disposition: A | Discharge status: Home / Self Care | Payer: Medicare HMO | Source: Ambulatory Visit | Attending: Vascular Surgery | Admitting: Vascular Surgery

## 2016-06-25 DIAGNOSIS — I7025 Atherosclerosis of native arteries of other extremities with ulceration: Secondary | ICD-10-CM

## 2016-06-25 DIAGNOSIS — I70203 Unspecified atherosclerosis of native arteries of extremities, bilateral legs: Secondary | ICD-10-CM | POA: Diagnosis not present

## 2016-06-27 ENCOUNTER — Other Ambulatory Visit: Payer: Self-pay | Admitting: *Deleted

## 2016-06-27 NOTE — Patient Outreach (Signed)
[facility]) Care Management  06/27/2016  Leroy [name] 1945-06-15 [id number]   Attempted to meet with patient x2 unable to meet with him.  Spoke with SW regarding patient. She states patient will be discharging next week.  He will have ongoing Home care from [facility] home care.  Patient will return to Independent living apartment.   Patient has [facility] brochure for reference, given to him at last facility visit to review with his daughter. Will sign off. Alben Spittle. Albertha Ghee, RN, BSN, CCM  Post Acute Chartered loss adjuster [phone]) Business Cell  [phone]) Toll Free Office

## 2016-06-28 ENCOUNTER — Encounter: Payer: Self-pay | Admitting: Vascular Surgery

## 2016-06-28 ENCOUNTER — Ambulatory Visit (HOSPITAL_COMMUNITY)
Admission: RE | Admit: 2016-06-28 | Discharge: 2016-06-28 | Disposition: A | Discharge status: Home / Self Care | Payer: Medicare HMO | Source: Ambulatory Visit | Attending: Vascular Surgery | Admitting: Vascular Surgery

## 2016-06-28 ENCOUNTER — Ambulatory Visit (INDEPENDENT_AMBULATORY_CARE_PROVIDER_SITE_OTHER): Payer: Medicare HMO | Admitting: Vascular Surgery

## 2016-06-28 ENCOUNTER — Encounter (HOSPITAL_COMMUNITY): Payer: Self-pay

## 2016-06-28 ENCOUNTER — Other Ambulatory Visit: Payer: Self-pay

## 2016-06-28 VITALS — HR 104 | Temp 97.4°F | Resp 20 | Ht 69.0 in | Wt 233.0 lb

## 2016-06-28 DIAGNOSIS — I7025 Atherosclerosis of native arteries of other extremities with ulceration: Secondary | ICD-10-CM

## 2016-06-28 DIAGNOSIS — Z89511 Acquired absence of right leg below knee: Secondary | ICD-10-CM | POA: Diagnosis not present

## 2016-06-28 DIAGNOSIS — Z89512 Acquired absence of left leg below knee: Secondary | ICD-10-CM

## 2016-07-01 ENCOUNTER — Encounter (HOSPITAL_COMMUNITY)
Admission: RE | Disposition: A | Discharge status: Skilled Nursing Facility | Payer: Self-pay | Source: Ambulatory Visit | Attending: Vascular Surgery

## 2016-07-01 ENCOUNTER — Observation Stay (HOSPITAL_COMMUNITY): Payer: Medicare HMO

## 2016-07-01 ENCOUNTER — Observation Stay (HOSPITAL_COMMUNITY)
Admission: RE | Admit: 2016-07-01 | Discharge: 2016-07-02 | Disposition: A | Discharge status: Skilled Nursing Facility | Payer: Medicare HMO | Source: Ambulatory Visit | Attending: Vascular Surgery | Admitting: Vascular Surgery

## 2016-07-01 DIAGNOSIS — I5022 Chronic systolic (congestive) heart failure: Secondary | ICD-10-CM | POA: Diagnosis not present

## 2016-07-01 DIAGNOSIS — E785 Hyperlipidemia, unspecified: Secondary | ICD-10-CM | POA: Insufficient documentation

## 2016-07-01 DIAGNOSIS — I130 Hypertensive heart and chronic kidney disease with heart failure and stage 1 through stage 4 chronic kidney disease, or unspecified chronic kidney disease: Secondary | ICD-10-CM | POA: Diagnosis not present

## 2016-07-01 DIAGNOSIS — Z7901 Long term (current) use of anticoagulants: Secondary | ICD-10-CM | POA: Insufficient documentation

## 2016-07-01 DIAGNOSIS — Z89511 Acquired absence of right leg below knee: Secondary | ICD-10-CM

## 2016-07-01 DIAGNOSIS — I2510 Atherosclerotic heart disease of native coronary artery without angina pectoris: Secondary | ICD-10-CM | POA: Insufficient documentation

## 2016-07-01 DIAGNOSIS — Z89512 Acquired absence of left leg below knee: Secondary | ICD-10-CM | POA: Insufficient documentation

## 2016-07-01 DIAGNOSIS — N189 Chronic kidney disease, unspecified: Secondary | ICD-10-CM | POA: Insufficient documentation

## 2016-07-01 DIAGNOSIS — Z833 Family history of diabetes mellitus: Secondary | ICD-10-CM | POA: Insufficient documentation

## 2016-07-01 DIAGNOSIS — I998 Other disorder of circulatory system: Secondary | ICD-10-CM | POA: Insufficient documentation

## 2016-07-01 DIAGNOSIS — E1151 Type 2 diabetes mellitus with diabetic peripheral angiopathy without gangrene: Secondary | ICD-10-CM | POA: Insufficient documentation

## 2016-07-01 DIAGNOSIS — Z951 Presence of aortocoronary bypass graft: Secondary | ICD-10-CM | POA: Insufficient documentation

## 2016-07-01 DIAGNOSIS — Z79899 Other long term (current) drug therapy: Secondary | ICD-10-CM | POA: Insufficient documentation

## 2016-07-01 DIAGNOSIS — R0602 Shortness of breath: Secondary | ICD-10-CM | POA: Diagnosis not present

## 2016-07-01 DIAGNOSIS — T8789 Other complications of amputation stump: Secondary | ICD-10-CM | POA: Diagnosis not present

## 2016-07-01 DIAGNOSIS — K449 Diaphragmatic hernia without obstruction or gangrene: Secondary | ICD-10-CM | POA: Insufficient documentation

## 2016-07-01 DIAGNOSIS — E1142 Type 2 diabetes mellitus with diabetic polyneuropathy: Secondary | ICD-10-CM | POA: Insufficient documentation

## 2016-07-01 DIAGNOSIS — Z86718 Personal history of other venous thrombosis and embolism: Secondary | ICD-10-CM | POA: Insufficient documentation

## 2016-07-01 DIAGNOSIS — I4892 Unspecified atrial flutter: Secondary | ICD-10-CM | POA: Insufficient documentation

## 2016-07-01 DIAGNOSIS — E1122 Type 2 diabetes mellitus with diabetic chronic kidney disease: Secondary | ICD-10-CM | POA: Diagnosis not present

## 2016-07-01 DIAGNOSIS — Z955 Presence of coronary angioplasty implant and graft: Secondary | ICD-10-CM | POA: Insufficient documentation

## 2016-07-01 DIAGNOSIS — X58XXXA Exposure to other specified factors, initial encounter: Secondary | ICD-10-CM | POA: Insufficient documentation

## 2016-07-01 DIAGNOSIS — Z87891 Personal history of nicotine dependence: Secondary | ICD-10-CM | POA: Insufficient documentation

## 2016-07-01 DIAGNOSIS — I255 Ischemic cardiomyopathy: Secondary | ICD-10-CM | POA: Insufficient documentation

## 2016-07-01 DIAGNOSIS — Z9114 Patient's other noncompliance with medication regimen: Secondary | ICD-10-CM | POA: Insufficient documentation

## 2016-07-01 DIAGNOSIS — I70209 Unspecified atherosclerosis of native arteries of extremities, unspecified extremity: Secondary | ICD-10-CM | POA: Insufficient documentation

## 2016-07-01 DIAGNOSIS — I252 Old myocardial infarction: Secondary | ICD-10-CM | POA: Insufficient documentation

## 2016-07-01 DIAGNOSIS — Z8673 Personal history of transient ischemic attack (TIA), and cerebral infarction without residual deficits: Secondary | ICD-10-CM | POA: Insufficient documentation

## 2016-07-01 DIAGNOSIS — Z8249 Family history of ischemic heart disease and other diseases of the circulatory system: Secondary | ICD-10-CM | POA: Insufficient documentation

## 2016-07-01 DIAGNOSIS — Z823 Family history of stroke: Secondary | ICD-10-CM | POA: Insufficient documentation

## 2016-07-01 DIAGNOSIS — Z888 Allergy status to other drugs, medicaments and biological substances status: Secondary | ICD-10-CM | POA: Insufficient documentation

## 2016-07-01 DIAGNOSIS — R0600 Dyspnea, unspecified: Secondary | ICD-10-CM | POA: Insufficient documentation

## 2016-07-01 DIAGNOSIS — Z794 Long term (current) use of insulin: Secondary | ICD-10-CM | POA: Insufficient documentation

## 2016-07-01 DIAGNOSIS — I480 Paroxysmal atrial fibrillation: Secondary | ICD-10-CM | POA: Insufficient documentation

## 2016-07-01 HISTORY — PX: ABDOMINAL AORTOGRAM W/LOWER EXTREMITY: CATH118223

## 2016-07-01 LAB — GLUCOSE, CAPILLARY
GLUCOSE-CAPILLARY: 69 mg/dL (ref 65–99)
Glucose-Capillary: 101 mg/dL — ABNORMAL HIGH (ref 65–99)
Glucose-Capillary: 109 mg/dL — ABNORMAL HIGH (ref 65–99)
Glucose-Capillary: 192 mg/dL — ABNORMAL HIGH (ref 65–99)
Glucose-Capillary: 232 mg/dL — ABNORMAL HIGH (ref 65–99)

## 2016-07-01 LAB — POCT I-STAT, CHEM 8
BUN: 28 mg/dL — AB (ref 6–20)
Calcium, Ion: 1.25 mmol/L (ref 1.15–1.40)
Chloride: 102 mmol/L (ref 101–111)
Creatinine, Ser: 1.3 mg/dL — ABNORMAL HIGH (ref 0.61–1.24)
Glucose, Bld: 59 mg/dL — ABNORMAL LOW (ref 65–99)
HCT: 35 % — ABNORMAL LOW (ref 39.0–52.0)
Hemoglobin: 11.9 g/dL — ABNORMAL LOW (ref 13.0–17.0)
Potassium: 4.5 mmol/L (ref 3.5–5.1)
SODIUM: 140 mmol/L (ref 135–145)
TCO2: 26 mmol/L (ref 0–100)

## 2016-07-01 SURGERY — ABDOMINAL AORTOGRAM W/LOWER EXTREMITY
Anesthesia: LOCAL

## 2016-07-01 MED ORDER — IPRATROPIUM-ALBUTEROL 0.5-2.5 (3) MG/3ML IN SOLN
3.0000 mL | Freq: Four times a day (QID) | RESPIRATORY_TRACT | Status: DC | PRN
  Administered 2016-07-01: 3 mL via RESPIRATORY_TRACT

## 2016-07-01 MED ORDER — ALBUTEROL SULFATE (2.5 MG/3ML) 0.083% IN NEBU
2.5000 mg | INHALATION_SOLUTION | Freq: Four times a day (QID) | RESPIRATORY_TRACT | Status: DC
  Administered 2016-07-01 – 2016-07-02 (×2): 2.5 mg via RESPIRATORY_TRACT
  Filled 2016-07-01 (×3): qty 3

## 2016-07-01 MED ORDER — HEPARIN (PORCINE) IN NACL 2-0.9 UNIT/ML-% IJ SOLN
INTRAMUSCULAR | Status: DC | PRN
  Administered 2016-07-01: 1000 mL

## 2016-07-01 MED ORDER — METOPROLOL TARTRATE 5 MG/5ML IV SOLN: 2.0000 mg | INTRAVENOUS | Status: DC | PRN

## 2016-07-01 MED ORDER — ACETAMINOPHEN 325 MG RE SUPP: 325.0000 mg | RECTAL | Status: DC | PRN

## 2016-07-01 MED ORDER — ISOSORBIDE MONONITRATE ER 30 MG PO TB24
30.0000 mg | ORAL_TABLET | Freq: Every day | ORAL | Status: DC
Start: 2016-07-01 — End: 2016-07-02
  Administered 2016-07-02: 30 mg via ORAL
  Filled 2016-07-01: qty 1

## 2016-07-01 MED ORDER — LIDOCAINE HCL (PF) 1 % IJ SOLN
INTRAMUSCULAR | Status: DC | PRN
  Administered 2016-07-01: 20 mL via SUBCUTANEOUS

## 2016-07-01 MED ORDER — HEPARIN (PORCINE) IN NACL 2-0.9 UNIT/ML-% IJ SOLN
INTRAMUSCULAR | Status: AC
  Filled 2016-07-01: qty 1000

## 2016-07-01 MED ORDER — MIDAZOLAM HCL 2 MG/2ML IJ SOLN
INTRAMUSCULAR | Status: DC | PRN
  Administered 2016-07-01: 0.5 mg via INTRAVENOUS

## 2016-07-01 MED ORDER — DEXTROSE 50 % IV SOLN
INTRAVENOUS | Status: AC
  Administered 2016-07-01: 25 mL
  Filled 2016-07-01: qty 50

## 2016-07-01 MED ORDER — LIDOCAINE HCL (PF) 1 % IJ SOLN
INTRAMUSCULAR | Status: AC
  Filled 2016-07-01: qty 30

## 2016-07-01 MED ORDER — DEXTROSE 50 % IV SOLN
25.0000 mL | Freq: Once | INTRAVENOUS | Status: AC
  Administered 2016-07-01: 25 mL via INTRAVENOUS

## 2016-07-01 MED ORDER — ONDANSETRON HCL 4 MG/2ML IJ SOLN: 4.0000 mg | Freq: Four times a day (QID) | INTRAMUSCULAR | Status: DC | PRN

## 2016-07-01 MED ORDER — OCUVITE-LUTEIN PO CAPS
1.0000 | ORAL_CAPSULE | Freq: Every day | ORAL | Status: DC
  Filled 2016-07-01 (×2): qty 1

## 2016-07-01 MED ORDER — MORPHINE SULFATE (PF) 4 MG/ML IV SOLN: 2.0000 mg | INTRAVENOUS | Status: DC | PRN

## 2016-07-01 MED ORDER — ROSUVASTATIN CALCIUM 20 MG PO TABS
20.0000 mg | ORAL_TABLET | Freq: Every day | ORAL | Status: DC
  Administered 2016-07-01 – 2016-07-02 (×2): 20 mg via ORAL
  Filled 2016-07-01 (×2): qty 1
  Filled 2016-07-01: qty 2

## 2016-07-01 MED ORDER — IODIXANOL 320 MG/ML IV SOLN
INTRAVENOUS | Status: DC | PRN
  Administered 2016-07-01: 105 mL

## 2016-07-01 MED ORDER — SODIUM CHLORIDE 0.9 % IV SOLN
INTRAVENOUS | Status: DC
  Administered 2016-07-01: 11:00:00 via INTRAVENOUS

## 2016-07-01 MED ORDER — OXYCODONE-ACETAMINOPHEN 5-325 MG PO TABS: 1.0000 | ORAL_TABLET | Freq: Four times a day (QID) | ORAL | Status: DC | PRN

## 2016-07-01 MED ORDER — POLYETHYLENE GLYCOL 3350 17 G PO PACK: 17.0000 g | PACK | Freq: Every day | ORAL | Status: DC | PRN

## 2016-07-01 MED ORDER — SODIUM CHLORIDE 0.9% FLUSH: 3.0000 mL | INTRAVENOUS | Status: DC | PRN

## 2016-07-01 MED ORDER — POTASSIUM CHLORIDE CRYS ER 20 MEQ PO TBCR: 20.0000 meq | EXTENDED_RELEASE_TABLET | Freq: Once | ORAL | Status: DC | PRN

## 2016-07-01 MED ORDER — PHENOL 1.4 % MT LIQD: 1.0000 | OROMUCOSAL | Status: DC | PRN

## 2016-07-01 MED ORDER — RIVAROXABAN 20 MG PO TABS
20.0000 mg | ORAL_TABLET | Freq: Every day | ORAL | Status: DC
  Administered 2016-07-02: 20 mg via ORAL
  Filled 2016-07-01: qty 1

## 2016-07-01 MED ORDER — VITAMIN C 500 MG PO TABS
500.0000 mg | ORAL_TABLET | Freq: Two times a day (BID) | ORAL | Status: DC
  Administered 2016-07-01 – 2016-07-02 (×2): 500 mg via ORAL
  Filled 2016-07-01 (×2): qty 1

## 2016-07-01 MED ORDER — BENZONATATE 100 MG PO CAPS: 100.0000 mg | ORAL_CAPSULE | Freq: Three times a day (TID) | ORAL | Status: DC | PRN

## 2016-07-01 MED ORDER — INSULIN DETEMIR 100 UNIT/ML ~~LOC~~ SOLN
25.0000 [IU] | Freq: Every day | SUBCUTANEOUS | Status: DC
  Administered 2016-07-01: 25 [IU] via SUBCUTANEOUS
  Filled 2016-07-01 (×3): qty 0.25

## 2016-07-01 MED ORDER — OXYCODONE-ACETAMINOPHEN 5-325 MG PO TABS: 1.0000 | ORAL_TABLET | ORAL | Status: DC | PRN

## 2016-07-01 MED ORDER — POTASSIUM CHLORIDE ER 10 MEQ PO TBCR
10.0000 meq | EXTENDED_RELEASE_TABLET | Freq: Every day | ORAL | Status: DC
  Administered 2016-07-01 – 2016-07-02 (×2): 10 meq via ORAL
  Filled 2016-07-01 (×3): qty 1

## 2016-07-01 MED ORDER — BISACODYL 10 MG RE SUPP: 10.0000 mg | Freq: Every day | RECTAL | Status: DC | PRN

## 2016-07-01 MED ORDER — INSULIN ASPART 100 UNIT/ML ~~LOC~~ SOLN
3.0000 [IU] | Freq: Three times a day (TID) | SUBCUTANEOUS | Status: DC
  Administered 2016-07-02 (×2): 3 [IU] via SUBCUTANEOUS

## 2016-07-01 MED ORDER — FENTANYL CITRATE (PF) 100 MCG/2ML IJ SOLN
INTRAMUSCULAR | Status: AC
  Filled 2016-07-01: qty 2

## 2016-07-01 MED ORDER — ACETAMINOPHEN 325 MG PO TABS: 325.0000 mg | ORAL_TABLET | ORAL | Status: DC | PRN

## 2016-07-01 MED ORDER — SODIUM CHLORIDE 0.9 % IV SOLN: 1.0000 mL/kg/h | INTRAVENOUS | Status: DC

## 2016-07-01 MED ORDER — PRO-STAT SUGAR FREE PO LIQD
30.0000 mL | Freq: Two times a day (BID) | ORAL | Status: DC
  Administered 2016-07-02: 30 mL via ORAL
  Filled 2016-07-01 (×2): qty 30

## 2016-07-01 MED ORDER — HYDRALAZINE HCL 25 MG PO TABS
37.5000 mg | ORAL_TABLET | Freq: Three times a day (TID) | ORAL | Status: DC
  Administered 2016-07-01 – 2016-07-02 (×3): 37.5 mg via ORAL
  Filled 2016-07-01 (×3): qty 2

## 2016-07-01 MED ORDER — IPRATROPIUM-ALBUTEROL 0.5-2.5 (3) MG/3ML IN SOLN
3.0000 mL | RESPIRATORY_TRACT | Status: DC | PRN
  Filled 2016-07-01: qty 3

## 2016-07-01 MED ORDER — INSULIN ASPART 100 UNIT/ML ~~LOC~~ SOLN
0.0000 [IU] | Freq: Three times a day (TID) | SUBCUTANEOUS | Status: DC
  Administered 2016-07-02: 2 [IU] via SUBCUTANEOUS

## 2016-07-01 MED ORDER — FENTANYL CITRATE (PF) 100 MCG/2ML IJ SOLN
INTRAMUSCULAR | Status: DC | PRN
  Administered 2016-07-01: 25 ug via INTRAVENOUS

## 2016-07-01 MED ORDER — SODIUM CHLORIDE 0.9% FLUSH
3.0000 mL | Freq: Two times a day (BID) | INTRAVENOUS | Status: DC
  Administered 2016-07-01: 3 mL via INTRAVENOUS

## 2016-07-01 MED ORDER — FUROSEMIDE 40 MG PO TABS
40.0000 mg | ORAL_TABLET | Freq: Every day | ORAL | Status: DC
  Administered 2016-07-01 – 2016-07-02 (×2): 40 mg via ORAL
  Filled 2016-07-01 (×2): qty 1

## 2016-07-01 MED ORDER — SODIUM CHLORIDE 0.9 % IV SOLN: 250.0000 mL | INTRAVENOUS | Status: DC | PRN

## 2016-07-01 MED ORDER — LABETALOL HCL 5 MG/ML IV SOLN: 10.0000 mg | INTRAVENOUS | Status: DC | PRN

## 2016-07-01 MED ORDER — ZINC SULFATE 220 (50 ZN) MG PO CAPS
220.0000 mg | ORAL_CAPSULE | Freq: Every day | ORAL | Status: DC
  Administered 2016-07-01 – 2016-07-02 (×2): 220 mg via ORAL
  Filled 2016-07-01 (×2): qty 1

## 2016-07-01 MED ORDER — ALUM & MAG HYDROXIDE-SIMETH 200-200-20 MG/5ML PO SUSP: 15.0000 mL | ORAL | Status: DC | PRN

## 2016-07-01 MED ORDER — PANTOPRAZOLE SODIUM 40 MG PO TBEC
40.0000 mg | DELAYED_RELEASE_TABLET | Freq: Every day | ORAL | Status: DC
  Administered 2016-07-02 (×3): 40 mg via ORAL

## 2016-07-01 MED ORDER — CARVEDILOL 3.125 MG PO TABS
3.1250 mg | ORAL_TABLET | Freq: Two times a day (BID) | ORAL | Status: DC
  Administered 2016-07-02 (×2): 3.125 mg via ORAL
  Filled 2016-07-01 (×2): qty 1

## 2016-07-01 MED ORDER — CLOPIDOGREL BISULFATE 75 MG PO TABS
75.0000 mg | ORAL_TABLET | Freq: Every day | ORAL | Status: DC
  Administered 2016-07-02: 75 mg via ORAL
  Filled 2016-07-01: qty 1

## 2016-07-01 MED ORDER — PANTOPRAZOLE SODIUM 40 MG PO TBEC
40.0000 mg | DELAYED_RELEASE_TABLET | Freq: Every day | ORAL | Status: DC
  Filled 2016-07-01 (×2): qty 1

## 2016-07-01 MED ORDER — HYDRALAZINE HCL 20 MG/ML IJ SOLN: 5.0000 mg | INTRAMUSCULAR | Status: DC | PRN

## 2016-07-01 MED ORDER — DOCUSATE SODIUM 100 MG PO CAPS
100.0000 mg | ORAL_CAPSULE | Freq: Two times a day (BID) | ORAL | Status: DC
  Administered 2016-07-02: 100 mg via ORAL
  Filled 2016-07-01 (×2): qty 1

## 2016-07-01 MED ORDER — GUAIFENESIN-DM 100-10 MG/5ML PO SYRP
15.0000 mL | ORAL_SOLUTION | ORAL | Status: DC | PRN
Start: 2016-07-01 — End: 2016-07-02

## 2016-07-01 MED ORDER — ALBUTEROL SULFATE HFA 108 (90 BASE) MCG/ACT IN AERS: 2.0000 | INHALATION_SPRAY | Freq: Four times a day (QID) | RESPIRATORY_TRACT | Status: DC | PRN

## 2016-07-01 MED ORDER — MIDAZOLAM HCL 2 MG/2ML IJ SOLN
INTRAMUSCULAR | Status: AC
  Filled 2016-07-01: qty 2

## 2016-07-01 MED ORDER — IPRATROPIUM-ALBUTEROL 0.5-2.5 (3) MG/3ML IN SOLN
3.0000 mL | Freq: Four times a day (QID) | RESPIRATORY_TRACT | Status: DC
Start: 2016-07-01 — End: 2016-07-01

## 2016-07-01 SURGICAL SUPPLY — 15 items
BAG SNAP BAND KOVER 36X36 (MISCELLANEOUS) ×1 IMPLANT
CATH CROSS OVER TEMPO 5F (CATHETERS) ×1 IMPLANT
CATH OMNI FLUSH 5F 65CM (CATHETERS) ×1 IMPLANT
CATH SOFT-VU 4F 65 STRAIGHT (CATHETERS) IMPLANT
CATH SOFT-VU STRAIGHT 4F 65CM (CATHETERS) ×2
KIT MICROINTRODUCER STIFF 5F (SHEATH) ×1 IMPLANT
KIT PV (KITS) ×2 IMPLANT
SHEATH PINNACLE 5F 10CM (SHEATH) ×1 IMPLANT
SHEATH PINNACLE 6F 10CM (SHEATH) ×1 IMPLANT
SYR MEDRAD MARK V 150ML (SYRINGE) ×2 IMPLANT
TRANSDUCER W/STOPCOCK (MISCELLANEOUS) ×2 IMPLANT
TRAY PV CATH (CUSTOM PROCEDURE TRAY) ×2 IMPLANT
WIRE BENTSON .035X145CM (WIRE) ×1 IMPLANT
WIRE MINI STICK MAX (SHEATH) ×1 IMPLANT
WIRE TORQFLEX AUST .018X40CM (WIRE) ×1 IMPLANT

## 2016-07-01 NOTE — Interval H&P Note (Signed)
History and Physical Interval Note:  07/01/2016 10:01 AM  Leroy [name]  has presented today for surgery, with the diagnosis of pvd with bilateral BKA wounds  The various methods of treatment have been discussed with the patient and family. After consideration of risks, benefits and other options for treatment, the patient has consented to  Procedure(s): Abdominal Aortogram w/Lower Extremity (N/A) as a surgical intervention .  The patient's history has been reviewed, patient examined, no change in status, stable for surgery.  I have reviewed the patient's chart and labs.  Questions were answered to the patient's satisfaction.     [name]

## 2016-07-01 NOTE — Progress Notes (Signed)
Called by Shawna Orleans is short stay to see pt.  He is audibly wheezing and short of breath.  Will admit for 23 hour observation.  PCXR ordered as well as nebs and home lasix restarted.  Check labs in am.  Doreatha Massed, [facility] 07/01/2016 5:22 PM

## 2016-07-01 NOTE — Progress Notes (Signed)
Report called and transferred to 2-W-23 via bed

## 2016-07-01 NOTE — Progress Notes (Signed)
Per Lelon Mast Rhyne,PA iv fluids stopped

## 2016-07-01 NOTE — Op Note (Signed)
OPERATIVE NOTE   PROCEDURE: 1.  left common femoral artery cannulation under ultrasound guidance 2.  Placement of catheter in aorta 3.  Aortogram 4.  Conscious sedation for 28 minutes 5.  Second order arterial selection 6.  Right leg runoff via catheter 7.  Left leg runoff via sheath  PRE-OPERATIVE DIAGNOSIS: ischemic bilateral below-knee amputation stumps  POST-OPERATIVE DIAGNOSIS: same as above   SURGEON: [name], MD  ANESTHESIA: conscious sedation  ESTIMATED BLOOD LOSS: 30 cc  CONTRAST: 105 cc  FINDING(S):  Aorta: patent   Right Left  RA patent Patent, 50% stenosis in proximal 1/3  CIA Patent with <30% stenosis orificial Patent with <30% stenosis in proximal 1/3  EIA patent patent  IIA Patent but multiple serial stenoses >75% Occluded  CFA Patent with proximal stenosis >90% Patent  SFA Patent but diffusely diseased with serial stenoses >90% Occluded proximally, reconstitutes distally via profunda collaterals  PFA Patent, subtotal occlusion proximal at take off Patent, limited collateral  Pop Patent but diffusely diseased Patent but diffusely disease  Trif Occluded, BKA Occluded, BKA  AT Occluded, BKA Occluded, BKA  Pero Occluded, BKA Occluded, BKA  PT Occluded, BKA Occluded, BKA   SPECIMEN(S):  none  INDICATIONS:   Leroy [name] is a 71 y.o. male who presents with ischemic bilateral below-knee amputation stumps.  The patient presents for: aortogram, bilateral iliac artery angiogram, possible itnervention.  I discussed with the patient the nature of angiographic procedures, especially the limited patencies of any endovascular intervention.  The patient is aware of that the risks of an angiographic procedure include but are not limited to: bleeding, infection, access site complications, renal failure, embolization, rupture of vessel, dissection, possible need for emergent surgical intervention, possible need for surgical procedures to treat the patient's  pathology, and stroke and death.  The patient is aware of the risks and agrees to proceed.  DESCRIPTION: After full informed consent was obtained from the patient, the patient was brought back to the angiography suite.  The patient was placed supine upon the angiography table and connected to cardiopulmonary monitoring equipment.  The patient was then given conscious sedation, the amounts of which are documented in the patient's chart.  A circulating radiologic technician maintained continuous monitoring of the patient's cardiopulmonary status.  Additionally, the control room radiologic technician provided backup monitoring throughout the procedure.  The patient was prepped and drape in the standard fashion for an angiographic procedure.  At this point, attention was turned to the left groin.  Under ultrasound guidance, the subcutaneous tissue surrounding the left common femoral artery was anesthesized with 1% lidocaine with epinephrine.  The artery was then cannulated with a micropuncture needle with some difficulty due to anterior wall calcification.  The microwire was advanced into the iliac arterial system.  The needle was exchanged for a microsheath, which was loaded into the common femoral artery over the wire.  The microwire was exchanged for a Bentson wire which was advanced into the aorta.  The microsheath was then exchanged for a 5-Fr sheath but the sheath would not advance, so I exchanged it for 6-Fr dilator which did pass into the left common femoral artery.  I exchanged the dilator for the 5-Fr sheath with dilator.  The dilator was removed.  The Omniflush catheter was then loaded over the wire up to the level of L1.  The catheter was connected to the power injector circuit.  After de-airring and de-clotting the circuit, a power injector aortogram was completed.  The  findings are listed above.  I pulled the catheter down to the distal aorta.  I did a LAO pelvic injection which demonstrated  calcification in the iliac arterial system without frank hemodynamically significant stenosis.  Using a Bentson wire and Crossover catheter, the right common iliac artery was selected.  The catheter and wire were advanced into the external iliac artery.  The right leg was imaged in stations.  The findings are listed above.    I pulled out the catheter.  The sheath was aspirated.  No clots were present and the sheath was reloaded with heparinized saline.  I connected the left sheath to the power injector circuit.  The left leg was imaged in stations. The findings are listed above.  The sheath was aspirated.  No clots were present and the sheath was reloaded with heparinized saline.     Based on the images, I would recommend: bilateral above-knee amputations and right common femoral artery endarterectomy with bovine patch angioplasty and extended profundaplasty.  The patient was not able to carry on a conversation so I will need to discuss his options with him once he is more alert.   COMPLICATIONS: none  CONDITION: stable   [name], MD, [facility] [place] Office: [phone] Pager: [phone]  07/01/2016, 1:04 PM

## 2016-07-01 NOTE — Progress Notes (Signed)
Client c/o shortness of breath with getting on the bedpan and states wears O2 at home and O2 started at 2L/min and [employer] notified and in to see client and will admit overnight

## 2016-07-01 NOTE — Progress Notes (Signed)
Site area: Right groin a 5 french arterial sheath was remove by Kristian Covey RN  Site Prior to Removal:  Level 0  Pressure Applied For 15 MINUTES    Bedrest Beginning at 1335p  Manual:   Yes.    Patient Status During Pull:  stable  Post Pull Groin Site:  Level 0  Post Pull Instructions Given:  Yes.    Post Pull Pulses Present:  Yes.    Dressing Applied:  Yes.    Comments:  VS remain stable during sheath pull.  D50 25cc given for BS of 69.  Will recheck

## 2016-07-01 NOTE — H&P (View-Only) (Signed)
Established Critical Limb Ischemia Patient  History of Present Illness  Leroy [name] is a 71 y.o. 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  The patient is being seen by wound care.  The wound care physician thinks this patient has compromised inflow to both BKA. The patient current moves in a wheelchair and does not use his prosthesis.  He is not exactly certain of chronology of his disease.  This pt returns from his recent appt for his vascular studies.   Past Medical History:  Diagnosis Date  . Acute respiratory failure with hypoxia and hypercapnia ([place]) 05/31/2016  . Atrial flutter ([place])   . Cataract   . Chronic kidney disease    on Lisinopril to protect kidneys d/t being on Metformin per pt  . Chronic systolic CHF (congestive heart failure) ([place])   . Coronary artery disease    a. s/p remote CABG 2001 at [facility] b. Cath 03/2010: for med rx.;  c.  Carlton Adam myoview (1/14):  Large Inferior apical MI, very small area of peri-infarct ischemia toward the inferior apical segment. EF 19%.  Med Rx was continued.    . Diabetes mellitus 1979   takes Metformni daily  and Lantus 50units bid  . DVT (deep venous thrombosis) ([place]) 2011   legs  . H/O hiatal hernia   . H/O medication noncompliance    Due to insurance issues  . History of blood clots    in legs--this was in 2011--has been off of Coumadin 29month;takes Xarelto daily  . Hyperlipidemia   . Hypotension   . Ischemic cardiomyopathy    a. EF 40% 2010. b. Echo (2/14):  mild LVH, EF 30-35%, diff HK, Gr 1 DD, MAC, mild MR, mod LAE, PASP 39  . Joint pain   . Myocardial infarction    total of 8 heart attacks;last one in 2011  . Paroxysmal atrial fibrillation (H[facility])   . Peripheral neuropathy ([place]   . Peripheral vascular disease ([place]    a. s/p L BKA 2014.  .Marland KitchenShortness of breath dyspnea   . Stroke ([facility]     Past Surgical History:  Procedure Laterality Date  . ABDOMINAL AORTAGRAM  04-30-12   . ABDOMINAL AORTAGRAM N/A 04/30/2012   Procedure: ABDOMINAL AMaxcine Ham  Surgeon: BConrad Petersburg MD;  [facility];  Service: Cardiovascular;  Laterality: N/A;  . ADENOIDECTOMY    . AMPUTATION Left 06/03/2012   Procedure: AMPUTATION BELOW KNEE;  Surgeon: BConrad [place]town MD;  [place] [facility]  Service: Vascular;  Laterality: Left;  . AMPUTATION Right 04/14/2013   Procedure: AMPUTATION BELOW KNEE ;  Surgeon: BConrad Jessie MD;  [place] [facility]  Service: Vascular;  Laterality: Right;  . APPLICATION OF WOUND VAC Right 04/20/2014   Procedure: APPLICATION OF WOUND VAC;  Surgeon: BConrad Cactus MD;  [facility]  Service: Vascular;  Laterality: Right;  . CARDIAC CATHETERIZATION  03/19/10  . CARDIAC CATHETERIZATION N/A 02/22/2016   Procedure: Left Heart Cath and Cors/Grafts Angiography;  Surgeon: JJettie Booze MD;  [facility];  Service: Cardiovascular;  Laterality: N/A;  . CARDIAC CATHETERIZATION N/A 02/22/2016   Procedure: Coronary Stent Intervention;  Surgeon: JJettie Booze MD;  [facility];  Service: Cardiovascular;  Laterality: N/A;  . CARDIAC CATHETERIZATION    . COLONOSCOPY WITH PROPOFOL N/A 10/24/2015   Procedure: COLONOSCOPY WITH PROPOFOL;  Surgeon: DMilus Banister MD;  [facility];  Service:  Endoscopy;  Laterality: N/A;  . CORONARY ANGIOPLASTY  2002   3 stents  . CORONARY ARTERY BYPASS GRAFT  03/2000   quadruple  . ENDARTERECTOMY FEMORAL Right 03/25/2013   Procedure: ENDARTERECTOMY FEMORAL ;  Surgeon: Conrad Liberty, MD;  Location: [place];  Service: Vascular;  Laterality: Right;  . EYE SURGERY Right    Catarct  . HERNIA REPAIR     Hiatal Hernia  . I&D EXTREMITY Right 04/14/2013   Procedure: IRRIGATION AND DEBRIDEMENT OF RIGHT  GROIN & PLACEMENT OF VAC DRESSING;  Surgeon: Conrad Muscoda, MD;  Location: [place];  Service: Vascular;  Laterality: Right;  . I&D EXTREMITY Right 04/20/2014   Procedure: DEBRIDEMENT OF RIGHT BELOW KNEE  AMPUTATION STUMP;  Surgeon: Conrad San Miguel, MD;  Location: [place];  Service: Vascular;  Laterality: Right;  . LOWER EXTREMITY ANGIOGRAM Bilateral 04/30/2012   Procedure: LOWER EXTREMITY ANGIOGRAM;  Surgeon: Conrad [place], MD;  Location: [facility] CATH LAB;  Service: Cardiovascular;  Laterality: Bilateral;  bilat lower extrem angio  . PATCH ANGIOPLASTY Right 03/25/2013   Procedure: PATCH ANGIOPLASTY USING VASCU-GUARD PERIPHERAL VASCULAR PATCH;  Surgeon: Conrad Anch[place], MD;  Location: [place];  Service: Vascular;  Laterality: Right;  . revasculariztion  2011/2010   x 2   . TONSILLECTOMY    . WOUND DEBRIDEMENT Right 04/20/2014   s/p  rt bka  with application of wound vac    Social History   Social History  . Marital status: Legally Separated    Spouse name: N/A  . Number of children: N/A  . Years of education: N/A   Occupational History  . Not on file.   Social History Main Topics  . Smoking status: Former Smoker    Types: Cigarettes  . Smokeless tobacco: Never Used     Comment: quit smoking 54yr ago  . Alcohol use No  . Drug use: No  . Sexual activity: Not Currently   Other Topics Concern  . Not on file   Social History Narrative  . No narrative on file    Family History  Problem Relation Age of Onset  . Heart disease Mother     before age 70 . Diabetes Mother   . Heart attack Mother   . Stroke Mother   . Heart disease Father   . Colon cancer Neg Hx   . Stomach cancer Neg Hx   . Pancreatic cancer Neg Hx   . Esophageal cancer Neg Hx      Current Outpatient Prescriptions  Medication Sig Dispense Refill  . albuterol (PROVENTIL HFA;VENTOLIN HFA) 108 (90 Base) MCG/ACT inhaler Inhale 2 puffs into the lungs every 6 (six) hours as needed for wheezing or shortness of breath. 1 Inhaler 0  . benzonatate (TESSALON) 100 MG capsule Take 100 mg by mouth 3 (three) times daily as needed for cough.    . carvedilol (COREG) 3.125 MG tablet Take 1 tablet (3.125 mg total) by mouth 2 (two) times  daily with a meal. 60 tablet 0  . clopidogrel (PLAVIX) 75 MG tablet Take 1 tablet (75 mg total) by mouth daily. 90 tablet 1  . docusate sodium (COLACE) 100 MG capsule Take 1 capsule (100 mg total) by mouth 2 (two) times daily. 10 capsule 0  . furosemide (LASIX) 40 MG tablet Take 1 tablet (40 mg total) by mouth daily. 30 tablet 0  . hydrALAZINE (APRESOLINE) 25 MG tablet Take 1.5 tablets (37.5 mg total) by mouth every 8 (eight) hours. 90 tablet  0  . insulin aspart (NOVOLOG) 100 UNIT/ML injection Inject 0-15 Units into the skin 3 (three) times daily with meals. 10 mL 11  . insulin aspart (NOVOLOG) 100 UNIT/ML injection Inject 3 Units into the skin 3 (three) times daily with meals. 10 mL 11  . insulin glargine (LANTUS) 100 UNIT/ML injection Inject 0.25 mLs (25 Units total) into the skin daily at 10 pm. 10 mL 11  . ipratropium-albuterol (DUONEB) 0.5-2.5 (3) MG/3ML SOLN Take 3 mLs by nebulization every 4 (four) hours as needed. 360 mL 0  . isosorbide mononitrate (IMDUR) 30 MG 24 hr tablet Take 1 tablet (30 mg total) by mouth daily. 30 tablet 0  . pantoprazole (PROTONIX) 40 MG tablet Take 1 tablet (40 mg total) by mouth daily at 12 noon. 30 tablet 0  . potassium chloride (K-DUR) 10 MEQ tablet Take 1 tablet (10 mEq total) by mouth daily. 30 tablet 0  . rosuvastatin (CRESTOR) 20 MG tablet Take 20 mg by mouth daily.    . rivaroxaban (XARELTO) 20 MG TABS tablet Take 1 tablet (20 mg total) by mouth daily with supper. (Patient not taking: Reported on 06/28/2016) 30 tablet 0   No current facility-administered medications for this visit.      Allergies  Allergen Reactions  . Statins Other (See Comments)    Reaction:  Muscle pain   . Amlodipine Besylate Other (See Comments)    Reaction:  Muscle pain   . Cephalexin Hives     REVIEW OF SYSTEMS:  (Positives checked otherwise negative)  CARDIOVASCULAR:   _0  chest pain,  _1  chest pressure,  _2  palpitations,  _3  shortness of breath when laying flat,    _4  shortness of breath with exertion,   _5  pain in feet when walking,  _6  pain in feet when laying flat, _7  history of blood clot in veins (DVT),  _8  history of phlebitis,  _9  swelling in legs,  _10  varicose veins  PULMONARY:   _11  productive cough,  _12  asthma,  _13  wheezing  NEUROLOGIC:   _14  weakness in arms or legs,  _15  numbness in arms or legs,  _16  difficulty speaking or slurred speech,  _17  temporary loss of vision in one eye,  _18  dizziness  HEMATOLOGIC:   _19  bleeding problems,  _20  problems with blood clotting too easily  MUSCULOSKEL:   _21  joint pain, _22  joint swelling  GASTROINTEST:   _23  vomiting blood,  _24  blood in stool     GENITOURINARY:   _25  burning with urination,  _26  blood in urine  PSYCHIATRIC:   _27  history of major depression  INTEGUMENTARY:   _28  rashes,  _29  ulcers  CONSTITUTIONAL:   _30  fever,  _31  chills   Physical Examination  Vitals:   06/28/16 0843  Pulse: (!) 104  Resp: 20  Temp: 97.4 F (36.3 C)  TempSrc: Oral  SpO2: 97%  Weight: 233 lb (105.7 kg)  Height: _32  (1.753 m)   BP could not be measured on any limb  General: alert, WDWN  Eyes: PERRL, EOMI  Pulmonary: Sym exp, good air movt, CTAB, no rales, rhonchi, & wheezing  Cardiac: RRR, Nl S1, S2, no Murmurs, rubs or gallops  Vascular: Vessel Right Left  Radial Palpable Palpable  Brachial Palpable Palpable  Carotid Palpable, without bruit Palpable, without bruit  Aorta Not palpable  N/A  Femoral Not Palpable due to position Not Palpable due to position  Popliteal Not palpable Not palpable  PT BKA BKA  DP BKA BKA   Gastrointestinal: soft, NTND, -G/R, - HSM, - masses, - CVAT B  Musculoskeletal: M/S 5/5 throughout BUE, able to move both BKA spontaneous, new dressings on both BKA, visible tissue is cold to touch  Neurologic: Pain and light touch intact in extremities , Motor exam as listed above  BLE arterial duplex  (06/25/16):  Sub-optimal study  R: mono flow in CFA , PFA, and SFA  L: mono flow in L CFA with hemodynamically significant stenosis in CFA and PFA, occluded SFA  Inflow disease suspected   Medical Decision Making  Leroy [name] is a 71 y.o. ([birth year]) male who presents with: ischemic B BKA   We discussed his options.  Pt elected proceeding with aortogram, bilateral iliac angiogram, possible intervention. I discussed with the patient the nature of angiographic procedures, especially the limited patencies of any endovascular intervention.   The patient is aware of that the risks of an angiographic procedure include but are not limited to: bleeding, infection, access site complications, renal failure, embolization, rupture of vessel, dissection, arteriovenous fistula, possible need for emergent surgical intervention, possible need for surgical procedures to treat the patient's pathology, anaphylactic reaction to contrast, and stroke and death.   The patient is aware of the risks and agrees to proceed.  He scheduled for this coming Monday 26 MAR 18  Thank you for allowing [place] to participate in this patient's care.   Adele Barthel, MD, FACS [facility] Office: [phone] Pager: [phone]

## 2016-07-01 NOTE — Progress Notes (Signed)
I-STAT Glucose result was 59.  1/2 amp D50 pushed at 1050am.  Repeat CBG at 1105am was 101.  Will continue to monitor

## 2016-07-02 ENCOUNTER — Telehealth: Payer: Self-pay | Admitting: Vascular Surgery

## 2016-07-02 ENCOUNTER — Telehealth: Payer: Self-pay | Admitting: Internal Medicine

## 2016-07-02 ENCOUNTER — Encounter (HOSPITAL_COMMUNITY): Payer: Self-pay | Admitting: Vascular Surgery

## 2016-07-02 DIAGNOSIS — E1151 Type 2 diabetes mellitus with diabetic peripheral angiopathy without gangrene: Secondary | ICD-10-CM | POA: Diagnosis not present

## 2016-07-02 DIAGNOSIS — I998 Other disorder of circulatory system: Secondary | ICD-10-CM | POA: Diagnosis not present

## 2016-07-02 DIAGNOSIS — G894 Chronic pain syndrome: Secondary | ICD-10-CM | POA: Diagnosis not present

## 2016-07-02 DIAGNOSIS — T8789 Other complications of amputation stump: Secondary | ICD-10-CM | POA: Diagnosis not present

## 2016-07-02 DIAGNOSIS — I96 Gangrene, not elsewhere classified: Secondary | ICD-10-CM | POA: Diagnosis not present

## 2016-07-02 DIAGNOSIS — I5022 Chronic systolic (congestive) heart failure: Secondary | ICD-10-CM | POA: Diagnosis not present

## 2016-07-02 DIAGNOSIS — E1142 Type 2 diabetes mellitus with diabetic polyneuropathy: Secondary | ICD-10-CM | POA: Diagnosis not present

## 2016-07-02 DIAGNOSIS — I130 Hypertensive heart and chronic kidney disease with heart failure and stage 1 through stage 4 chronic kidney disease, or unspecified chronic kidney disease: Secondary | ICD-10-CM | POA: Diagnosis not present

## 2016-07-02 DIAGNOSIS — E1122 Type 2 diabetes mellitus with diabetic chronic kidney disease: Secondary | ICD-10-CM | POA: Diagnosis not present

## 2016-07-02 DIAGNOSIS — I2510 Atherosclerotic heart disease of native coronary artery without angina pectoris: Secondary | ICD-10-CM | POA: Diagnosis not present

## 2016-07-02 DIAGNOSIS — N189 Chronic kidney disease, unspecified: Secondary | ICD-10-CM | POA: Diagnosis not present

## 2016-07-02 DIAGNOSIS — R531 Weakness: Secondary | ICD-10-CM | POA: Diagnosis not present

## 2016-07-02 LAB — GLUCOSE, CAPILLARY
GLUCOSE-CAPILLARY: 125 mg/dL — AB (ref 65–99)
Glucose-Capillary: 100 mg/dL — ABNORMAL HIGH (ref 65–99)
Glucose-Capillary: 110 mg/dL — ABNORMAL HIGH (ref 65–99)

## 2016-07-02 LAB — BASIC METABOLIC PANEL
ANION GAP: 13 (ref 5–15)
BUN: 25 mg/dL — ABNORMAL HIGH (ref 6–20)
CALCIUM: 8.9 mg/dL (ref 8.9–10.3)
CO2: 20 mmol/L — ABNORMAL LOW (ref 22–32)
Chloride: 103 mmol/L (ref 101–111)
Creatinine, Ser: 1.34 mg/dL — ABNORMAL HIGH (ref 0.61–1.24)
GFR calc Af Amer: >60 mL/min (ref >60)
GFR, EST NON AFRICAN AMERICAN: 52 mL/min — AB (ref >60)
GLUCOSE: 113 mg/dL — AB (ref 65–99)
Potassium: 4.6 mmol/L (ref 3.5–5.1)
SODIUM: 136 mmol/L (ref 135–145)

## 2016-07-02 LAB — CBC
HCT: 36.8 % — ABNORMAL LOW (ref 39.0–52.0)
Hemoglobin: 11.6 g/dL — ABNORMAL LOW (ref 13.0–17.0)
MCH: 27.8 pg (ref 26.0–34.0)
MCHC: 31.5 g/dL (ref 30.0–36.0)
MCV: 88.2 fL (ref 78.0–100.0)
PLATELETS: 181 10*3/uL (ref 150–400)
RBC: 4.17 MIL/uL — AB (ref 4.22–5.81)
RDW: 16.5 % — AB (ref 11.5–15.5)
WBC: 6.7 10*3/uL (ref 4.0–10.5)

## 2016-07-02 MED ORDER — ADULT MULTIVITAMIN W/MINERALS CH: 1.0000 | ORAL_TABLET | Freq: Every day | ORAL | Status: DC

## 2016-07-02 MED ORDER — THIAMINE HCL 100 MG/ML IJ SOLN: 100.0000 mg | Freq: Every day | INTRAMUSCULAR | Status: DC

## 2016-07-02 MED ORDER — LORAZEPAM 2 MG/ML IJ SOLN: 1.0000 mg | Freq: Four times a day (QID) | INTRAMUSCULAR | Status: DC | PRN

## 2016-07-02 MED ORDER — PROSIGHT PO TABS
1.0000 | ORAL_TABLET | Freq: Every day | ORAL | Status: DC
  Administered 2016-07-02: 1 via ORAL
  Filled 2016-07-02: qty 1

## 2016-07-02 MED ORDER — VITAMIN B-1 100 MG PO TABS: 100.0000 mg | ORAL_TABLET | Freq: Every day | ORAL | Status: DC

## 2016-07-02 MED ORDER — FOLIC ACID 1 MG PO TABS: 1.0000 mg | ORAL_TABLET | Freq: Every day | ORAL | Status: DC

## 2016-07-02 MED ORDER — LORAZEPAM 1 MG PO TABS: 1.0000 mg | ORAL_TABLET | Freq: Four times a day (QID) | ORAL | Status: DC | PRN

## 2016-07-02 NOTE — Telephone Encounter (Signed)
Called requesting verbal orders to start care tommorow with [facility].

## 2016-07-02 NOTE — Discharge Instructions (Signed)
Femoral Site Care Refer to this sheet in the next few weeks. These instructions provide you with information about caring for yourself after your procedure. Your health care provider may also give you more specific instructions. Your treatment has been planned according to current medical practices, but problems sometimes occur. Call your health care provider if you have any problems or questions after your procedure. What can I expect after the procedure? After your procedure, it is typical to have the following:  Bruising at the site that usually fades within 1-2 weeks.  Blood collecting in the tissue (hematoma) that may be painful to the touch. It should usually decrease in size and tenderness within 1-2 weeks. Follow these instructions at home:  Take medicines only as directed by your health care provider.  You may shower 24-48 hours after the procedure or as directed by your health care provider. Remove the bandage (dressing) and gently wash the site with plain soap and water. Pat the area dry with a clean towel. Do not rub the site, because this may cause bleeding.  Do not take baths, swim, or use a hot tub until your health care provider approves.  Check your insertion site every day for redness, swelling, or drainage.  Do not apply powder or lotion to the site.  Limit use of stairs to twice a day for the first 2-3 days or as directed by your health care provider.  Do not squat for the first 2-3 days or as directed by your health care provider.  Do not lift over 10 lb (4.5 kg) for 5 days after your procedure or as directed by your health care provider.  Ask your health care provider when it is okay to:  Return to work or school.  Resume usual physical activities or sports.  Resume sexual activity.  Do not drive home if you are discharged the same day as the procedure. Have someone else drive you.  You may drive 24 hours after the procedure unless otherwise instructed by  your health care provider.  Do not operate machinery or power tools for 24 hours after the procedure or as directed by your health care provider.  If your procedure was done as an outpatient procedure, which means that you went home the same day as your procedure, a responsible adult should be with you for the first 24 hours after you arrive home.  Keep all follow-up visits as directed by your health care provider. This is important. Contact a health care provider if:  You have a fever.  You have chills.  You have increased bleeding from the site. Hold pressure on the site. Get help right away if:  You have unusual pain at the site.  You have redness, warmth, or swelling at the site.  You have drainage (other than a small amount of blood on the dressing) from the site.  The site is bleeding, and the bleeding does not stop after 30 minutes of holding steady pressure on the site.  Your leg or foot becomes pale, cool, tingly, or numb. This information is not intended to replace advice given to you by your health care provider. Make sure you discuss any questions you have with your health care provider. Document Released: 11/26/2013 Document Revised: 08/31/2015 Document Reviewed: 10/12/2013 Elsevier Interactive Patient Education  2017 [employer].  Information on my medicine - XARELTO (rivaroxaban)  This medication education was reviewed with me or my healthcare representative as part of my discharge preparation.  WHY WAS XARELTO PRESCRIBED FOR YOU? Xarelto was prescribed to prevent blood clots that may form in the veins of your legs (deep vein thrombosis) or in your lungs (pulmonary embolism) and to reduce the risk of them occurring again.  What do you need to know about Xarelto? The starting dose is one 20 mg tablet taken ONCE A DAY with your evening meal.   DO NOT stop taking Xarelto without talking to the health care provider who prescribed the medication.  Refill your  prescription for 20 mg tablets before you run out.  After discharge, you should have regular check-up appointments with your healthcare provider that is prescribing your Xarelto.  In the future your dose may need to be changed if your kidney function changes by a significant amount.  What do you do if you miss a dose?  If you are taking Xarelto ONCE DAILY and you miss a dose, take it as soon as you remember on the same day then continue your regularly scheduled once daily regimen the next day. Do not take two doses of Xarelto at the same time.   Important Safety Information Xarelto is a blood thinner medicine that can cause bleeding. You should call your healthcare provider right away if you experience any of the following: ? Bleeding from an injury or your nose that does not stop. ? Unusual colored urine (red or dark brown) or unusual colored stools (red or black). ? Unusual bruising for unknown reasons. ? A serious fall or if you hit your head (even if there is no bleeding).  Some medicines may interact with Xarelto and might increase your risk of bleeding while on Xarelto. To help avoid this, consult your healthcare provider or pharmacist prior to using any new prescription or non-prescription medications, including herbals, vitamins, non-steroidal anti-inflammatory drugs (NSAIDs) and supplements.  This website has more information on Xarelto: VisitDestination.com.br.

## 2016-07-02 NOTE — Clinical Social Work Note (Signed)
Clinical Social Work Assessment  Patient Details  Name: Leroy [name] MRN: [id number] Date of Birth: Nov 05, 1945  Date of referral:  07/02/16               Reason for consult:  Discharge Planning                Permission sought to share information with:  Facility Art therapist granted to share information::     Name::        Agency::  [facility]  Relationship::     Contact Information:     Housing/Transportation Living arrangements for the past 2 months:  [place] of Information:  Patient, Medical Team, Facility Patient Interpreter Needed:  None Criminal Activity/Legal Involvement Pertinent to Current Situation/Hospitalization:  No - Comment as needed Significant Relationships:  Adult Children Lives with:  Adult Children, Facility Resident Do you feel safe going back to the place where you live?  Yes Need for family participation in patient care:  Yes (Comment)  Care giving concerns:  Patient is a rehab resident at [facility].   Social Worker assessment / plan:  CSW met with patient. No supports at bedside. CSW introduced role and explained that discharge planning would be discussed. Patient agreeable to return to [facility]. His wheelchair is with him. CSW asked how he would get it back to facility and he said he came here by [facility] and wants to return that way. Patient is on 1 L oxygen. CSW will have to get approval from supervisor. [facility] admissions coordinator has faxed clinicals to [facility] for insurance authorization. No further concerns. CSW encouraged patient to contact CSW as needed. CSW will continue to follow patient for support and facilitate discharge back to SNF today.  Employment status:  Retired Nurse, adult PT Recommendations:  Not assessed at this time Information / Referral to community resources:  [place]  Patient/Family's  Response to care:  Patient agreeable to return to SNF. Patient's step-daughter supportive and involved in patient's care. Patient appreciated social work intervention.  Patient/Family's Understanding of and Emotional Response to Diagnosis, Current Treatment, and Prognosis:  Patient appears to have a good understanding of the reason for admission and understands that he still needs rehab prior to returning home. Patient has a flat affect so emotional response to hospital care is difficult to assess.  Emotional Assessment Appearance:  Appears stated age Attitude/Demeanor/Rapport:  Other (Flat) Affect (typically observed):  Accepting, Appropriate, Calm, Pleasant Orientation:  Oriented to Self, Oriented to Place, Oriented to  Time, Oriented to Situation Alcohol / Substance use:  Never Used Psych involvement (Current and /or in the community):  No (Comment)  Discharge Needs  Concerns to be addressed:  Care Coordination Readmission within the last 30 days:  No Current discharge risk:  Dependent with Mobility Barriers to Discharge:  [place], LCSW 07/02/2016, 11:13 AM

## 2016-07-02 NOTE — NC FL2 (Signed)
[place] MEDICAID FL2 LEVEL OF CARE SCREENING TOOL     IDENTIFICATION  Patient Name: Leroy [name] Birthdate: [birth year] Sex: male Admission Date (Current Location): 07/01/2016  [facility] [place] and [place] Number:  Producer, television/film/video and Address:  [facility] [place], [address] [place], [place] [address]      Provider Number: [id number]  Attending Physician Name and Address:  [name], MD  Relative Name and Phone Number:       Current Level of Care: Hospital Recommended Level of Care: Skilled Nursing Facility Prior Approval Number:    Date Approved/Denied:   PASRR Number: [id number]  Discharge Plan: SNF    Current Diagnoses: Patient Active Problem List   Diagnosis Date Noted  . Hypokalemia   . Ischemic cardiomyopathy   . Polypharmacy 06/01/2016  . Acute respiratory failure with hypoxia and hypercapnia (HCC) 05/31/2016  . Altered mental status   . Chronic renal disease, stage 2, mildly decreased glomerular filtration rate (GFR) between 60-89 mL/min/1.73 square meter 04/30/2016  . Chronic bronchitis (HCC) 04/30/2016  . Acute on chronic combined systolic and diastolic congestive heart failure (HCC)   . Noncompliance 02/23/2016  . Shortness of breath   . NSTEMI (non-ST elevated myocardial infarction) (HCC) 02/22/2016  . Essential hypertension 02/22/2016  . Ischemic colitis (HCC) 10/25/2015  . Hemispheric carotid artery syndrome   . Coronary artery disease involving coronary bypass graft of native heart without angina pectoris   . PVD (peripheral vascular disease) (HCC)   . 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 Diabetes mellitus type 2 with peripheral artery disease (HCC) 05/22/2015  . 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 Microalbuminuria 09/29/2014  . 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 Phantom limb pain (HCC) 08/16/2013  . Pain in limb- Right stump 07/06/2013  . Atherosclerosis of native arteries of the extremities  with ulceration (HCC) 06/25/2013  . S/P bilateral BKA (below knee amputation) (HCC) 04/21/2013  . Chronic systolic heart failure (HCC) 04/15/2013  . Femoral artery stenosis (HCC) 03/25/2013  . S/P BKA (below knee amputation) bilateral (HCC) 09/10/2012  . SVT (supraventricular tachycardia) (HCC) 09/02/2012  . Hyperlipidemia with target LDL less than 70 01/02/2012  . Paroxysmal atrial fibrillation (HCC) 04/13/2010    Orientation RESPIRATION BLADDER Height & Weight     Self, Time, Situation, Place  O2 (Nasal Canula 1 L) Continent Weight: 248 lb (112.5 kg) Height:     BEHAVIORAL SYMPTOMS/MOOD NEUROLOGICAL BOWEL NUTRITION STATUS   (None)  (History of stroke) Incontinent Diet (Heart healthy/carb modified)  AMBULATORY STATUS COMMUNICATION OF NEEDS Skin   Total Care Verbally Other (Comment) (Cracking. Dry skin/scabs on bilateral stumps.)                       Personal Care Assistance Level of Assistance  Bathing, Dressing, Feeding Bathing Assistance: Limited assistance Feeding assistance: Limited assistance Dressing Assistance: Limited assistance     Functional Limitations Info  Sight, Hearing, Speech Sight Info: Adequate Hearing Info: Adequate Speech Info: Adequate    SPECIAL CARE FACTORS FREQUENCY  PT (By licensed PT), OT (By licensed OT), Blood pressure                    Contractures Contractures Info: Not present    Additional Factors Info  Code Status, Allergies, Isolation Precautions Code Status Info: Full Allergies Info: Statins, Amlodipine Besylate, Cephalexin     Isolation Precautions Info: Contact: MRSA     Current  Medications (07/02/2016):  This is the current hospital active medication list Current Facility-Administered Medications  Medication Dose Route Frequency Provider Last Rate Last Dose  . 0.9 %  sodium chloride infusion  1 mL/kg/hr Intravenous Continuous [name], MD   Stopped at 07/01/16 1722  . 0.9 %  sodium chloride infusion  250 mL  Intravenous PRN Ames Coupe Rhyne, PA-C      . acetaminophen (TYLENOL) tablet 325-650 mg  325-650 mg Oral Q4H PRN [name] J Rhyne, PA-C       Or  . acetaminophen (TYLENOL) suppository 325-650 mg  325-650 mg Rectal Q4H PRN [name] J Rhyne, PA-C      . albuterol (PROVENTIL) (2.5 MG/3ML) 0.083% nebulizer solution 2.5 mg  2.5 mg Nebulization Q6H [name] J Rhyne, PA-C   2.5 mg at 07/02/16 0817  . alum & mag hydroxide-simeth (MAALOX/MYLANTA) 200-200-20 MG/5ML suspension 15-30 mL  15-30 mL Oral Q2H PRN [name] J Rhyne, PA-C      . benzonatate (TESSALON) capsule 100 mg  100 mg Oral TID PRN Ames Coupe Rhyne, PA-C      . bisacodyl (DULCOLAX) suppository 10 mg  10 mg Rectal Daily PRN [name] J Rhyne, PA-C      . carvedilol (COREG) tablet 3.125 mg  3.125 mg Oral BID WC [name] J Rhyne, PA-C   3.125 mg at 07/02/16 0756  . clopidogrel (PLAVIX) tablet 75 mg  75 mg Oral Daily Ames Coupe Rhyne, PA-C   75 mg at 07/02/16 0948  . docusate sodium (COLACE) capsule 100 mg  100 mg Oral BID Ames Coupe Rhyne, PA-C   100 mg at 07/02/16 0947  . feeding supplement (PRO-STAT SUGAR FREE 64) liquid 30 mL  30 mL Oral BID Ames Coupe Rhyne, PA-C   30 mL at 07/02/16 0947  . furosemide (LASIX) tablet 40 mg  40 mg Oral Daily [name] J Rhyne, PA-C   40 mg at 07/02/16 0947  . guaiFENesin-dextromethorphan (ROBITUSSIN DM) 100-10 MG/5ML syrup 15 mL  15 mL Oral Q4H PRN [name] J Rhyne, PA-C      . hydrALAZINE (APRESOLINE) injection 5 mg  5 mg Intravenous Q20 Min PRN [name] J Rhyne, PA-C      . hydrALAZINE (APRESOLINE) tablet 37.5 mg  37.5 mg Oral Q8H [name] J Rhyne, PA-C   37.5 mg at 07/02/16 0706  . insulin aspart (novoLOG) injection 0-15 Units  0-15 Units Subcutaneous TID WC [name] J Rhyne, PA-C      . insulin aspart (novoLOG) injection 3 Units  3 Units Subcutaneous TID WC Ames Coupe Rhyne, PA-C   3 Units at 07/02/16 0756  . insulin detemir (LEVEMIR) injection 25 Units  25 Units Subcutaneous Q2200 Dara Lords, PA-C   25  Units at 07/01/16 2202  . ipratropium-albuterol (DUONEB) 0.5-2.5 (3) MG/3ML nebulizer solution 3 mL  3 mL Nebulization Q6H PRN Ames Coupe Rhyne, PA-C   3 mL at 07/01/16 1733  . ipratropium-albuterol (DUONEB) 0.5-2.5 (3) MG/3ML nebulizer solution 3 mL  3 mL Nebulization Q4H PRN [name] J Rhyne, PA-C      . isosorbide mononitrate (IMDUR) 24 hr tablet 30 mg  30 mg Oral Daily [name] J Rhyne, PA-C   30 mg at 07/02/16 0947  . labetalol (NORMODYNE,TRANDATE) injection 10 mg  10 mg Intravenous Q10 min PRN [name] J Rhyne, PA-C      . metoprolol (LOPRESSOR) injection 2-5 mg  2-5 mg Intravenous Q2H PRN [name] J Rhyne, PA-C      . morphine 4 MG/ML injection 2  mg  2 mg Intravenous Q3H PRN [name], MD      . multivitamin (PROSIGHT) tablet 1 tablet  1 tablet Oral Daily [name], MD   1 tablet at 07/02/16 [phone]  . ondansetron (ZOFRAN) injection 4 mg  4 mg Intravenous Q6H PRN [name] J Rhyne, PA-C      . oxyCODONE-acetaminophen (PERCOCET/ROXICET) 5-325 MG per tablet 1-2 tablet  1-2 tablet Oral Q6H PRN [name], MD      . pantoprazole (PROTONIX) EC tablet 40 mg  40 mg Oral Daily [name] J Rhyne, PA-C      . pantoprazole (PROTONIX) EC tablet 40 mg  40 mg Oral Q1200 [name] J Rhyne, PA-C   40 mg at 07/02/16 1000  . phenol (CHLORASEPTIC) mouth spray 1 spray  1 spray Mouth/Throat PRN [name] J Rhyne, PA-C      . polyethylene glycol (MIRALAX / GLYCOLAX) packet 17 g  17 g Oral Daily PRN [name] J Rhyne, PA-C      . potassium chloride (K-DUR) CR tablet 10 mEq  10 mEq Oral Daily Ames Coupe Rhyne, PA-C   10 mEq at 07/02/16 0947  . potassium chloride SA (K-DUR,KLOR-CON) CR tablet 20-40 mEq  20-40 mEq Oral Once PRN Ames Coupe Rhyne, PA-C      . rivaroxaban (XARELTO) tablet 20 mg  20 mg Oral Q supper [name] J Rhyne, PA-C      . rosuvastatin (CRESTOR) tablet 20 mg  20 mg Oral Daily [name] J Rhyne, PA-C   20 mg at 07/02/16 0948  . sodium chloride flush (NS) 0.9 % injection 3 mL  3 mL Intravenous Q12H  Ames Coupe Rhyne, PA-C   3 mL at 07/01/16 2119  . sodium chloride flush (NS) 0.9 % injection 3 mL  3 mL Intravenous PRN [name] J Rhyne, PA-C      . vitamin C (ASCORBIC ACID) tablet 500 mg  500 mg Oral BID Ames Coupe Rhyne, PA-C   500 mg at 07/02/16 0948  . zinc sulfate capsule 220 mg  220 mg Oral Daily Ames Coupe Rhyne, PA-C   220 mg at 07/02/16 9604     Discharge Medications: Please see discharge summary for a list of discharge medications.  Relevant Imaging Results:  Relevant Lab Results:   Additional Information SS#: [id number]  Margarito Liner, LCSW

## 2016-07-02 NOTE — Care Management Note (Signed)
Case Management Note Donn Pierini RN, BSN Unit 2W-Case Manager [phone]  Patient Details  Name: Leroy [name] MRN: [id number] Date of Birth: Mar 27, 1946  Subjective/Objective:   Pt presented s/p abdominal aortogram for wounds to both BKA stumps                  Action/Plan: PTA pt was at [facility]- CSW following for return to SNF-   Expected Discharge Date:  07/02/16               Expected Discharge Plan:  Skilled Nursing Facility  In-House Referral:  Clinical Social Work  Discharge planning Services  CM Consult  Post Acute Care Choice:  NA Choice offered to:  NA  DME Arranged:    DME Agency:     HH Arranged:    HH Agency:     Status of Service:  Completed, signed off  If discussed at [employer], dates discussed:    Additional Comments:  Darrold Span, RN 07/02/2016, 10:36 AM

## 2016-07-02 NOTE — Telephone Encounter (Signed)
yes

## 2016-07-02 NOTE — Clinical Social Work Note (Addendum)
Clinicals have been sent to medical director at [facility] for review. It is possible that we will have an answer by 5:00 today. [facility] is agreeable to 5 day LOG. CSW has left Building services engineer of social work. Awaiting call back to discuss.  Charlynn Court, CSW [phone]  4:29 pm LOG approved.  Charlynn Court, CSW [phone]

## 2016-07-02 NOTE — Discharge Summary (Signed)
Patient ID:  Leroy [name] MRN: [id number] DOB/AGE: 1945-08-19 71 y.o.  Admit date: 07/01/2016 Discharge date: 07/02/2016 Date of Surgery: 07/01/2016 Surgeon: Surgeon(s): Conrad Stryker, MD  Admission Diagnosis: pvd with bilateral BKA wounds  Discharge Diagnoses:  pvd with bilateral BKA wounds  Secondary Diagnoses: Past Medical History:  Diagnosis Date  . Acute respiratory failure with hypoxia and hypercapnia ([place]) 05/31/2016  . Atrial flutter ([place])   . Cataract   . Chronic kidney disease    on Lisinopril to protect kidneys d/t being on Metformin per pt  . Chronic systolic CHF (congestive heart failure) ([place])   . Coronary artery disease    a. s/p remote CABG 2001 at [facility] b. Cath 03/2010: for med rx.;  c.  Carlton Adam myoview (1/14):  Large Inferior apical MI, very small area of peri-infarct ischemia toward the inferior apical segment. EF 19%.  Med Rx was continued.    . Diabetes mellitus 1979   takes Metformni daily  and Lantus 50units bid  . DVT (deep venous thrombosis) ([place]) 2011   legs  . H/O hiatal hernia   . H/O medication noncompliance    Due to insurance issues  . History of blood clots    in legs--this was in 2011--has been off of Coumadin 70month;takes Xarelto daily  . Hyperlipidemia   . Hypotension   . Ischemic cardiomyopathy    a. EF 40% 2010. b. Echo (2/14):  mild LVH, EF 30-35%, diff HK, Gr 1 DD, MAC, mild MR, mod LAE, PASP 39  . Joint pain   . Myocardial infarction    total of 8 heart attacks;last one in 2011  . Paroxysmal atrial fibrillation (HCC)   . [place]ipheral [place] ([place]   . Peripheral vascular disease ([place]    a. s/p L BKA 2014.  .Marland KitchenShortness of breath dyspnea   . Stroke ([facility]     Procedure(s): Abdominal Aortogram w/Lower Extremity  Discharged Condition: fair  HPI: Leroy CHIZMARis a 71y.o. (1August 15, 1947 male who presents with chief complaint: wounds in both BKA stumps. The patient is being seen by wound care. The wound care physician  thinks this patient has compromised inflow to both BKA. The patient current moves in a wheelchair and does not use his prosthesis. He is not exactly certain of chronology of his disease.  This pt returns from his recent appt for his vascular studies.   Hospital Course:  Leroy THUNEis a 71y.o. male is S/P  Procedure(s): Abdominal Aortogram w/Lower Extremity  Leroy CALANDRAis a 71y.o. ([birth year] male who presents with chief complaint: wounds in both BKA stumps. The patient is being seen by wound care. The wound care physician thinks this patient has compromised inflow to both BKA. The patient current moves in a wheelchair and does not use his prosthesis. He is not exactly certain of chronology of his disease.  This pt returns from his recent appt for his vascular studies.  Chest x ray post op  IMPRESSION: Minimal enlargement of cardiac silhouette and bibasilar atelectasis. Stable for discharge return to SNF   Significant Diagnostic Studies: CBC Lab Results  Component Value Date   WBC 6.7 07/02/2016   HGB 11.6 (L) 07/02/2016   HCT 36.8 (L) 07/02/2016   MCV 88.2 07/02/2016   PLT 181 07/02/2016    BMET    Component Value Date/Time   NA 136 07/02/2016 0509   K 4.6 07/02/2016 0509   CL 103 07/02/2016 0509  CO2 20 (L) 07/02/2016 0509   GLUCOSE 113 (H) 07/02/2016 0509   BUN 25 (H) 07/02/2016 0509   CREATININE 1.34 (H) 07/02/2016 0509   CALCIUM 8.9 07/02/2016 0509   GFRNONAA 52 (L) 07/02/2016 0509   GFRAA >60 07/02/2016 0509   COAG Lab Results  Component Value Date   INR 1.25 05/30/2016   INR 0.97 02/22/2016   INR 1.01 05/22/2015     Disposition:  Discharge to :Skilled nursing facility Discharge Instructions    Call MD for:  redness, tenderness, or signs of infection (pain, swelling, bleeding, redness, odor or green/yellow discharge around incision site)    Complete by:  As directed    Call MD for:  severe or increased pain, loss or decreased feeling  in  affected limb(s)    Complete by:  As directed    Call MD for:  temperature >100.5    Complete by:  As directed    Discharge instructions    Complete by:  As directed    May shower in 24 hours.  Remove left groin dressing prior to showering   Increase activity slowly    Complete by:  As directed    Walk with assistance use walker or cane as needed   Resume previous diet    Complete by:  As directed      Allergies as of 07/02/2016      Reactions   Statins Other (See Comments)   Reaction:  Muscle pain    Amlodipine Besylate Other (See Comments)   Reaction:  Muscle pain    Cephalexin Hives      Medication List    STOP taking these medications   insulin glargine 100 UNIT/ML injection Commonly known as:  LANTUS   ipratropium-albuterol 0.5-2.5 (3) MG/3ML Soln Commonly known as:  DUONEB     TAKE these medications   albuterol 108 (90 Base) MCG/ACT inhaler Commonly known as:  PROVENTIL HFA;VENTOLIN HFA Inhale 2 puffs into the lungs every 6 (six) hours as needed for wheezing or shortness of breath.   benzonatate 100 MG capsule Commonly known as:  TESSALON Take 100 mg by mouth 3 (three) times daily as needed for cough.   carvedilol 3.125 MG tablet Commonly known as:  COREG Take 1 tablet (3.125 mg total) by mouth 2 (two) times daily with a meal.   clopidogrel 75 MG tablet Commonly known as:  PLAVIX Take 1 tablet (75 mg total) by mouth daily.   DECUBI-VITE Caps Take 1 capsule by mouth daily.   docusate sodium 100 MG capsule Commonly known as:  COLACE Take 1 capsule (100 mg total) by mouth 2 (two) times daily.   feeding supplement (PRO-STAT SUGAR FREE 64) Liqd Take 30 mLs by mouth 2 (two) times daily.   furosemide 40 MG tablet Commonly known as:  LASIX Take 1 tablet (40 mg total) by mouth daily.   hydrALAZINE 25 MG tablet Commonly known as:  APRESOLINE Take 1.5 tablets (37.5 mg total) by mouth every 8 (eight) hours.   insulin aspart 100 UNIT/ML injection Commonly  known as:  novoLOG Inject 0-15 Units into the skin 3 (three) times daily with meals. What changed:  how much to take  additional instructions   insulin aspart 100 UNIT/ML injection Commonly known as:  novoLOG Inject 3 Units into the skin 3 (three) times daily with meals. What changed:  Another medication with the same name was changed. Make sure you understand how and when to take each.   isosorbide mononitrate 30  MG 24 hr tablet Commonly known as:  IMDUR Take 1 tablet (30 mg total) by mouth daily.   levalbuterol 0.63 MG/3ML nebulizer solution Commonly known as:  XOPENEX Take 0.63 mg by nebulization every 6 (six) hours as needed for wheezing or shortness of breath.   LEVEMIR FLEXTOUCH 100 UNIT/ML Pen Generic drug:  Insulin Detemir Inject 25 Units into the skin daily at 10 pm.   pantoprazole 40 MG tablet Commonly known as:  PROTONIX Take 1 tablet (40 mg total) by mouth daily at 12 noon.   potassium chloride 10 MEQ tablet Commonly known as:  K-DUR Take 1 tablet (10 mEq total) by mouth daily.   rivaroxaban 20 MG Tabs tablet Commonly known as:  XARELTO Take 1 tablet (20 mg total) by mouth daily with supper.   rosuvastatin 20 MG tablet Commonly known as:  CRESTOR Take 20 mg by mouth daily.   vitamin C 500 MG tablet Commonly known as:  ASCORBIC ACID Take 500 mg by mouth 2 (two) times daily.   zinc sulfate 220 (50 Zn) MG capsule Take 220 mg by mouth daily.      Verbal and written Discharge instructions given to the patient. Wound care per Discharge AVS Follow-up Information    Adele Barthel, MD Follow up in 2 week(s).   Specialties:  Vascular Surgery, Cardiology Why:  office will call for appointment Contact information: [place] [address] [phone]           Signed: Laurence Slate [facility] 07/02/2016, 8:21 AM  Addendum  This patient had a purely diagnostic angiogram to try to determine any salvage options for his B BKA wounds.  His  procedure was uneventful and the patient had no complications.  He has some respiratory difficulty post-procedure, likely due to over-sedation from pain medications.  The patient can be discharged back to his assisted care facility.  I have discussed with the patient proceeding with a redo right femoral endarterectomy with extended profundaplasy and bilateral above-knee amputations.  The patient is consider his options at this point.  He will follow up in my office in 2 weeks with a family member as I have some concerns that his cognitive ability has deteriorated.  Adele Barthel, MD, FACS [facility] Office: [phone] Pager: [phone]  07/02/2016, 1:25 PM

## 2016-07-02 NOTE — Clinical Social Work Note (Signed)
CSW facilitated patient discharge including contacting patient family and facility to confirm patient discharge plans. Clinical information faxed to facility and family agreeable with plan. CSW arranged ambulance transport via [facility] to [facility]. RN to call report prior to discharge [phone] Room 218).  CSW will sign off for now as social work intervention is no longer needed. Please consult [place] again if new needs arise.  Charlynn Court, CSW [phone]

## 2016-07-02 NOTE — Telephone Encounter (Signed)
spoke to [facility] pts step daughter about appt and family of POA needing to be present for appt per [facility] req. 07/01/16 bg  4/13 OV

## 2016-07-02 NOTE — Progress Notes (Addendum)
[facility]  Subjective  - Patient and Dr. Imogene Burn discussed surgical options at bedside.   Objective 104/87 (!) 117 97.4 F (36.3 C) (Oral) 18 93%  Intake/Output Summary (Last 24 hours) at 07/02/16 0814 Last data filed at 07/02/16 0135  Gross per 24 hour  Intake              200 ml  Output              700 ml  Net             -500 ml    Left groin stick site soft without hematoma.  Assessment/Planning: POD # 1 Angiogram  Dr. Nicky Pugh note: "Based on the images, I would recommend: bilateral above-knee amputations and right common femoral artery endarterectomy with bovine patch angioplasty and extended profundaplasty.  The patient was not able to carry on a conversation so I will need to discuss his options with him once he is more alert." F/U in 2 weeks with Dr. Imogene Burn in the office.    [facility] 07/02/2016 8:14 AM --  Addendum  I have independently interviewed and examined the patient, and I agree with the physician assistant's findings.  No complications from L CFA cannulation.  Suspect pt's resp distress yesterday was oversedation from pain medications +/- conscious sedation.  Pt still is undecided in regards to his surgical options.  I don't think either BKA stump is likely to heal given the limited flow visualized.  Additionally, the R CFA and PFA will require endarterectomy and patching to maintain the inflow to the R residual leg.  [name], MD, FACS [facility] [place] Office: [phone] Pager: [phone]  07/02/2016, 8:26 AM  -- Laboratory Lab Results:  Recent Labs  07/01/16 1036 07/02/16 0509  WBC  --  6.7  HGB 11.9* 11.6*  HCT 35.0* 36.8*  PLT  --  181   BMET  Recent Labs  07/01/16 1036 07/02/16 0509  NA 140 136  K 4.5 4.6  CL 102 103  CO2  --  20*  GLUCOSE 59* 113*  BUN 28* 25*  CREATININE 1.30* 1.34*  CALCIUM  --  8.9    COAG Lab Results  Component Value Date   INR 1.25 05/30/2016   INR 0.97 02/22/2016   INR 1.01 05/22/2015   No results found for: PTT

## 2016-07-02 NOTE — Telephone Encounter (Signed)
-----   Message from Sharee Pimple, RN sent at 07/01/2016  3:57 PM EDT ----- Regarding: Schedule next office day per [facility], Needs family or POA to attend this OV    ----- Message ----- From: [name], MD Sent: 07/01/2016   1:18 PM To: [address]  Leroy [name] [id number] February 24, 1946  PROCEDURE: 1.  left common femoral artery cannulation under ultrasound guidance 2.  Placement of catheter in aorta 3.  Aortogram 4.  Conscious sedation for 28 minutes 5.  Second order arterial selection 6.  Right leg runoff via catheter 7.  Left leg runoff via sheath  Follow-up: need to see him my next clinic

## 2016-07-03 ENCOUNTER — Encounter: Payer: Self-pay | Admitting: Cardiology

## 2016-07-03 ENCOUNTER — Ambulatory Visit (INDEPENDENT_AMBULATORY_CARE_PROVIDER_SITE_OTHER): Payer: Medicare HMO | Admitting: Cardiology

## 2016-07-03 VITALS — Ht 69.0 in | Wt 248.0 lb

## 2016-07-03 DIAGNOSIS — I998 Other disorder of circulatory system: Secondary | ICD-10-CM | POA: Diagnosis not present

## 2016-07-03 DIAGNOSIS — I2510 Atherosclerotic heart disease of native coronary artery without angina pectoris: Secondary | ICD-10-CM | POA: Diagnosis not present

## 2016-07-03 DIAGNOSIS — I5022 Chronic systolic (congestive) heart failure: Secondary | ICD-10-CM

## 2016-07-03 DIAGNOSIS — E1121 Type 2 diabetes mellitus with diabetic nephropathy: Secondary | ICD-10-CM | POA: Diagnosis not present

## 2016-07-03 DIAGNOSIS — I5023 Acute on chronic systolic (congestive) heart failure: Secondary | ICD-10-CM | POA: Diagnosis not present

## 2016-07-03 NOTE — Patient Instructions (Addendum)
Medication Instructions:   START TAKING AN  EXTRA  LASIX AND POTASSIUM TABLET AS NEEDED FOR WIEGHT GAIN OF 3 LBS IN 24 HOURS 5LBS IN A WEEK.   If you need a refill on your cardiac medications before your next appointment, please call your pharmacy.  Labwork: NONE ORDERED  TODAY    Testing/Procedures: NONE ORDERED  TODAY    Follow-Up: 2 to 3  MONTHS WITH DR [facility]   Any Other Special Instructions Will Be Listed Below (If Applicable).

## 2016-07-03 NOTE — Telephone Encounter (Signed)
Called back and pt will actually be going into a nursing facility.

## 2016-07-03 NOTE — Progress Notes (Signed)
07/03/2016 Leroy [name]   1945-08-21  [id number]  Primary Physician [name], MD Primary Cardiologist: Dr. Percival Spanish   Reason for Visit/CC: [facility] f/u for acute on chronic systolic CHF  HPI:  71 y.o.malewith past medical history significant for CKD, atrial flutter anticoagualted with Xarelto, DM, bilateral BKA, ischemic cardiomyopathy with EF 10-15% per echo in 02/2016, CAD s/p CABG in 2001 in [place], and NSTEMI 02/2016. He is wheel chair bound.   He recently required admission to [facility], after he was found unresponsive by his roommate at home. He was sent to the ED. ABG showed acute hypercarbic respiratory acidosis. CXR showed pulmonary edema. He was admitted with acute encephalopathy. This was felt to be likely drug induced, given he responded to Narcan, almost immediately. He was admited by IM. CT of head showed remote infarct but no acute findings. EEG did not show seizure activity. Cardiology was consulted for management of his acute CHF. He was diuresed with IV Lasix. Weight improved from 288>>234 lb. I/O net negative 25+ L. He was transitioned back to oral lasix. Metoprolol was switched to Coreg. No ACE/ARB given low BP and elevated SCr. His 2D echo showed further reduction in LVEF down to 10%, however Dr. Aundra Dubin was consulted. It was determined he is not a  Candidate for advanced HF therapies, including LVAD. Once medically stable, he was discharged to a SNF.   Today in f/u he reports that he has done ok. He denies any dyspnea. No chest pain. They have been checking his weight at SNF with a lift. He reports slight increase in his weight since discharge. Unfortunately, we do not have a records of his weights at SNF and we are unable to weigh today. His lung exam is clear. VSS.     No outpatient prescriptions have been marked as taking for the 07/03/16 encounter (Office Visit) with Consuelo Pandy, PA-C.   Allergies  Allergen Reactions  . Statins Other (See Comments)    Reaction:  Muscle pain   . Amlodipine Besylate Other (See Comments)    Reaction:  Muscle pain   . Cephalexin Hives   Past Medical History:  Diagnosis Date  . Acute respiratory failure with hypoxia and hypercapnia ([place]) 05/31/2016  . Atrial flutter ([place])   . Cataract   . Chronic kidney disease    on Lisinopril to protect kidneys d/t being on Metformin per pt  . Chronic systolic CHF (congestive heart failure) ([place])   . Coronary artery disease    a. s/p remote CABG 2001 at [facility] b. Cath 03/2010: for med rx.;  c.  Carlton Adam myoview (1/14):  Large Inferior apical MI, very small area of peri-infarct ischemia toward the inferior apical segment. EF 19%.  Med Rx was continued.    . Diabetes mellitus 1979   takes Metformni daily  and Lantus 50units bid  . DVT (deep venous thrombosis) ([place]) 2011   legs  . H/O hiatal hernia   . H/O medication noncompliance    Due to insurance issues  . History of blood clots    in legs--this was in 2011--has been off of Coumadin 27month;takes Xarelto daily  . Hyperlipidemia   . Hypotension   . Ischemic cardiomyopathy    a. EF 40% 2010. b. Echo (2/14):  mild LVH, EF 30-35%, diff HK, Gr 1 DD, MAC, mild MR, mod LAE, PASP 39  . Joint pain   . Myocardial infarction    total of 8 heart attacks;last one in 2011  .  Paroxysmal atrial fibrillation (HCC)   . Peripheral neuropathy ([place])   . Peripheral vascular disease ([place])    a. s/p L BKA 2014.  [name] Shortness of breath dyspnea   . Stroke [facility])    Family History  Problem Relation Age of Onset  . Heart disease Mother     before age 23  . Diabetes Mother   . Heart attack Mother   . Stroke Mother   . Heart disease Father   . Colon cancer Neg Hx   . Stomach cancer Neg Hx   . Pancreatic cancer Neg Hx   . Esophageal cancer Neg Hx    Past Surgical History:  Procedure Laterality Date  . ABDOMINAL AORTAGRAM  04-30-12  . ABDOMINAL AORTAGRAM N/A 04/30/2012   Procedure: ABDOMINAL Maxcine Ham;  Surgeon: Conrad [place], MD;   Location: [facility] CATH LAB;  Service: Cardiovascular;  Laterality: N/A;  . ABDOMINAL AORTOGRAM W/LOWER EXTREMITY N/A 07/01/2016   Procedure: Abdominal Aortogram w/Lower Extremity;  Surgeon: Conrad [place], MD;  Location: [place] CV LAB;  Service: Cardiovascular;  Laterality: N/A;  . ADENOIDECTOMY    . AMPUTATION Left 06/03/2012   Procedure: AMPUTATION BELOW KNEE;  Surgeon: Conrad Tornado, MD;  Location: [place];  Service: Vascular;  Laterality: Left;  . AMPUTATION Right 04/14/2013   Procedure: AMPUTATION BELOW KNEE ;  Surgeon: Conrad [place], MD;  Location: [place];  Service: Vascular;  Laterality: Right;  . APPLICATION OF WOUND VAC Right 04/20/2014   Procedure: APPLICATION OF WOUND VAC;  Surgeon: Conrad [place], MD;  Location: [place];  Service: Vascular;  Laterality: Right;  . CARDIAC CATHETERIZATION  03/19/10  . CARDIAC CATHETERIZATION N/A 02/22/2016   Procedure: Left Heart Cath and Cors/Grafts Angiography;  Surgeon: Jettie Booze, MD;  Location: [place] CV LAB;  Service: Cardiovascular;  Laterality: N/A;  . CARDIAC CATHETERIZATION N/A 02/22/2016   Procedure: Coronary Stent Intervention;  Surgeon: Jettie Booze, MD;  Location: [place] CV LAB;  Service: Cardiovascular;  Laterality: N/A;  . CARDIAC CATHETERIZATION    . COLONOSCOPY WITH PROPOFOL N/A 10/24/2015   Procedure: COLONOSCOPY WITH PROPOFOL;  Surgeon: Milus Banister, MD;  Location: [facility] ENDOSCOPY;  Service: Endoscopy;  Laterality: N/A;  . CORONARY ANGIOPLASTY  2002   3 stents  . CORONARY ARTERY BYPASS GRAFT  03/2000   quadruple  . ENDARTERECTOMY FEMORAL Right 03/25/2013   Procedure: ENDARTERECTOMY FEMORAL ;  Surgeon: Conrad Vance, MD;  Location: [place];  Service: Vascular;  Laterality: Right;  . EYE SURGERY Right    Catarct  . HERNIA REPAIR     Hiatal Hernia  . I&D EXTREMITY Right 04/14/2013   Procedure: IRRIGATION AND DEBRIDEMENT OF RIGHT  GROIN & PLACEMENT OF VAC DRESSING;  Surgeon: Conrad [place], MD;  Location: [place];  Service:  Vascular;  Laterality: Right;  . I&D EXTREMITY Right 04/20/2014   Procedure: DEBRIDEMENT OF RIGHT BELOW KNEE AMPUTATION STUMP;  Surgeon: Conrad [place], MD;  Location: [place];  Service: Vascular;  Laterality: Right;  . LOWER EXTREMITY ANGIOGRAM Bilateral 04/30/2012   Procedure: LOWER EXTREMITY ANGIOGRAM;  Surgeon: Conrad [place], MD;  Location: [facility] CATH LAB;  Service: Cardiovascular;  Laterality: Bilateral;  bilat lower extrem angio  . PATCH ANGIOPLASTY Right 03/25/2013   Procedure: PATCH ANGIOPLASTY USING VASCU-GUARD PERIPHERAL VASCULAR PATCH;  Surgeon: Conrad [place], MD;  Location: [place];  Service: Vascular;  Laterality: Right;  . revasculariztion  2011/2010   x 2   . TONSILLECTOMY    .  WOUND DEBRIDEMENT Right 04/20/2014   s/p  rt bka  with application of wound vac   Social History   Social History  . Marital status: Legally Separated    Spouse name: N/A  . Number of children: N/A  . Years of education: N/A   Occupational History  . Not on file.   Social History Main Topics  . Smoking status: Former Smoker    Types: Cigarettes  . Smokeless tobacco: Never Used     Comment: quit smoking 64yr ago  . Alcohol use No  . Drug use: No  . Sexual activity: Not Currently   Other Topics Concern  . Not on file   Social History Narrative  . No narrative on file     Review of Systems: General: negative for chills, fever, night sweats or weight changes.  Cardiovascular: negative for chest pain, dyspnea on exertion, edema, orthopnea, palpitations, paroxysmal nocturnal dyspnea or shortness of breath Dermatological: negative for rash Respiratory: negative for cough or wheezing Urologic: negative for hematuria Abdominal: negative for nausea, vomiting, diarrhea, bright red blood per rectum, melena, or hematemesis Neurologic: negative for visual changes, syncope, or dizziness All other systems reviewed and are otherwise negative except as noted above.   Physical Exam:  Height 5' 9"  (1.753 m),  weight 248 lb (112.5 kg).   GEN:No acute distress. Moderately obese  Neck:No JVD Cardiac:RRR, no murmurs, rubs, or gallops.  Respiratory:Clear to auscultation bilaterally. GZO:XWRU nontender, non-distended  MS:s/p bilateral BKAs  Neuro:Nonfocal  Psych: Normal affect   EKG not performed   ASSESSMENT AND PLAN:   1. Acute on Chronic systolic heart failure -EF now 10%, down from 15%. Although his ejection fraction has fallen over the past year, Per Dr. MAundra Dubin he is not a LVAD candidate -Metoprolol has been switched to coreg. No ACE-I/ARB due to low BP and elevated SCr - Continue Lasix for volume control. SNF was advised to continue to check daily weights and to give extra lasix tablet if >3lb weight gain in 24 hr or > 5 lb weight gain in 1 week.  - low sodium diet   2. CAD -S/P CABG 2001. NSTEMI and occluded LIMA to LAD- PTCA to LAD in 02/2016 with only modest improvement. He has known Occlusion of the native RCA and occlusion of the SVG to RCA.  - he denies angina - Continue medical therapy: Plavix, beta blocker, statin - No ARB/ACE give renal dysfunction  3. Paroxysmal atrial fibrillation -Maintaining sinus rhythm on beta blocker -CHA2DS2-VASc Score is 6 (CHF, age, stroke (2), DM, Vascular disease). He takes Xarelto for stroke risk reduction  4. PVD: s/p bilateral BKAs. Followed by Dr. CBridgett Larsson He may require redo right femoral endarterectomy with extended profundaplasy and bilateral above-knee amputations. He has f/u with Dr. CBridgett Larsson4/13 to further discuss.   5. Recent Acute encephalopathy, likely drug-induced [facility]  [facility] head showed remote infarct but no acute findings - EEG did not show seizure activity - he is now at SNF, meds monitored   PLAN  [facility]ival Spanish [facility]   Brittainy Simmons PA-C 07/03/2016 1:40 PM

## 2016-07-04 ENCOUNTER — Other Ambulatory Visit: Payer: Self-pay | Admitting: *Deleted

## 2016-07-04 NOTE — Telephone Encounter (Signed)
Pt on TCM call. Admitted for pvd with bilateral BKA wounds. DC'ed on 07/02/2016. Pt was dc to home but is now in SNF.

## 2016-07-04 NOTE — Patient Outreach (Signed)
[facility]) Care Management  07/04/2016  [name] 12/16/1945 [id number]   Met with Lowella Fairy, SW at facility. She reports patient now has Medicaid pending and will be LTC at facility. She reports patient is unable to return to his apartment.   Plan to sign off at this time. Requested SW consult RNCM again if discharge plans changed,she agrees.  Royetta Crochet. Laymond Purser, RN, BSN, [place] [phone]) Business Cell  [phone]) Toll Free Office

## 2016-07-10 DIAGNOSIS — I10 Essential (primary) hypertension: Secondary | ICD-10-CM | POA: Diagnosis not present

## 2016-07-10 DIAGNOSIS — F1910 Other psychoactive substance abuse, uncomplicated: Secondary | ICD-10-CM | POA: Diagnosis not present

## 2016-07-10 DIAGNOSIS — N183 Chronic kidney disease, stage 3 (moderate): Secondary | ICD-10-CM | POA: Diagnosis not present

## 2016-07-10 DIAGNOSIS — I255 Ischemic cardiomyopathy: Secondary | ICD-10-CM | POA: Diagnosis not present

## 2016-07-10 DIAGNOSIS — I480 Paroxysmal atrial fibrillation: Secondary | ICD-10-CM | POA: Diagnosis not present

## 2016-07-10 DIAGNOSIS — I5022 Chronic systolic (congestive) heart failure: Secondary | ICD-10-CM | POA: Diagnosis not present

## 2016-07-10 DIAGNOSIS — D649 Anemia, unspecified: Secondary | ICD-10-CM | POA: Diagnosis not present

## 2016-07-10 DIAGNOSIS — I739 Peripheral vascular disease, unspecified: Secondary | ICD-10-CM | POA: Diagnosis not present

## 2016-07-11 ENCOUNTER — Encounter: Payer: Self-pay | Admitting: Vascular Surgery

## 2016-07-16 NOTE — Progress Notes (Signed)
Established Critical Limb Ischemia Patient  History of Present Illness  Leroy [name] is a 71 y.o. (05-Aug-1945) male who presents with chief complaint: B BKA stump ischemia.  The patient has B BKA wounds.  The patient notes symptoms have not progressed.  The patient's treatment regimen currently included: maximal medical management and wound care.  Pt thinks R BKA wounds are healing.  L BKA wounds are having problems.  Pt saw his Cardiologist on 07/03/16 as follow-up to a recent admission to the hospital.  EF of 10% noted.  The patient recently had a B limited leg runoff to evaluate perfusion to his stumps  Past Medical History:  Diagnosis Date  . Acute respiratory failure with hypoxia and hypercapnia ([place]) 05/31/2016  . Atrial flutter ([place])   . Cataract   . Chronic kidney disease    on Lisinopril to protect kidneys d/t being on Metformin per pt  . Chronic systolic CHF (congestive heart failure) ([place])   . Coronary artery disease    a. s/p remote CABG 2001 at [facility] b. Cath 03/2010: for med rx.;  c.  Carlton Adam myoview (1/14):  Large Inferior apical MI, very small area of peri-infarct ischemia toward the inferior apical segment. EF 19%.  Med Rx was continued.    . Diabetes mellitus 1979   takes Metformni daily  and Lantus 50units bid  . DVT (deep venous thrombosis) ([place]) 2011   legs  . H/O hiatal hernia   . H/O medication noncompliance    Due to insurance issues  . History of blood clots    in legs--this was in 2011--has been off of Coumadin 7month;takes Xarelto daily  . Hyperlipidemia   . Hypotension   . Ischemic cardiomyopathy    a. EF 40% 2010. b. Echo (2/14):  mild LVH, EF 30-35%, diff HK, Gr 1 DD, MAC, mild MR, mod LAE, PASP 39  . Joint pain   . Myocardial infarction    total of 8 heart attacks;last one in 2011  . Paroxysmal atrial fibrillation (HCC)   . Peripheral neuropathy ([place]   . Peripheral vascular disease ([place]    a. s/p L BKA 2014.  .Marland KitchenShortness of breath dyspnea     . Stroke ([facility]     Past Surgical History:  Procedure Laterality Date  . ABDOMINAL AORTAGRAM  04-30-12  . ABDOMINAL AORTAGRAM N/A 04/30/2012   Procedure: ABDOMINAL AMaxcine Ham  Surgeon: BConrad [place] MD;  [facility];  Service: Cardiovascular;  Laterality: N/A;  . ABDOMINAL AORTOGRAM W/LOWER EXTREMITY N/A 07/01/2016   Procedure: Abdominal Aortogram w/Lower Extremity;  Surgeon: BConrad [place] MD;  [place];  Service: Cardiovascular;  Laterality: N/A;  . ADENOIDECTOMY    . AMPUTATION Left 06/03/2012   Procedure: AMPUTATION BELOW KNEE;  Surgeon: BConrad [place] MD;  [place]  Service: Vascular;  Laterality: Left;  . AMPUTATION Right 04/14/2013   Procedure: AMPUTATION BELOW KNEE ;  Surgeon: BConrad [place] MD;  [place]  Service: Vascular;  Laterality: Right;  . APPLICATION OF WOUND VAC Right 04/20/2014   Procedure: APPLICATION OF WOUND VAC;  Surgeon: BConrad [place] MD;  [place]  Service: Vascular;  Laterality: Right;  . CARDIAC CATHETERIZATION  03/19/10  . CARDIAC CATHETERIZATION N/A 02/22/2016   Procedure: Left Heart Cath and Cors/Grafts Angiography;  Surgeon: JJettie Booze MD;  [place];  Service: Cardiovascular;  Laterality: N/A;  . CARDIAC CATHETERIZATION N/A 02/22/2016  Procedure: Coronary Stent Intervention;  Surgeon: Jettie Booze, MD;  Location: [place] CV LAB;  Service: Cardiovascular;  Laterality: N/A;  . CARDIAC CATHETERIZATION    . COLONOSCOPY WITH PROPOFOL N/A 10/24/2015   Procedure: COLONOSCOPY WITH PROPOFOL;  Surgeon: Milus Banister, MD;  [facility];  Service: Endoscopy;  Laterality: N/A;  . CORONARY ANGIOPLASTY  2002   3 stents  . CORONARY ARTERY BYPASS GRAFT  03/2000   quadruple  . ENDARTERECTOMY FEMORAL Right 03/25/2013   Procedure: ENDARTERECTOMY FEMORAL ;  Surgeon: Conrad [place], MD;  Location: [place];  Service: Vascular;  Laterality: Right;  . EYE SURGERY Right    Catarct  .  HERNIA REPAIR     Hiatal Hernia  . I&D EXTREMITY Right 04/14/2013   Procedure: IRRIGATION AND DEBRIDEMENT OF RIGHT  GROIN & PLACEMENT OF VAC DRESSING;  Surgeon: Conrad [place], MD;  Location: [place];  Service: Vascular;  Laterality: Right;  . I&D EXTREMITY Right 04/20/2014   Procedure: DEBRIDEMENT OF RIGHT BELOW KNEE AMPUTATION STUMP;  Surgeon: Conrad [place], MD;  Location: [place];  Service: Vascular;  Laterality: Right;  . LOWER EXTREMITY ANGIOGRAM Bilateral 04/30/2012   Procedure: LOWER EXTREMITY ANGIOGRAM;  Surgeon: Conrad [place], MD;  Location: [facility] CATH LAB;  Service: Cardiovascular;  Laterality: Bilateral;  bilat lower extrem angio  . PATCH ANGIOPLASTY Right 03/25/2013   Procedure: PATCH ANGIOPLASTY USING VASCU-GUARD PERIPHERAL VASCULAR PATCH;  Surgeon: Conrad [place], MD;  Location: [place];  Service: Vascular;  Laterality: Right;  . revasculariztion  2011/2010   x 2   . TONSILLECTOMY    . WOUND DEBRIDEMENT Right 04/20/2014   s/p  rt bka  with application of wound vac    Social History   Social History  . Marital status: Legally Separated    Spouse name: N/A  . Number of children: N/A  . Years of education: N/A   Occupational History  . Not on file.   Social History Main Topics  . Smoking status: Former Smoker    Types: Cigarettes  . Smokeless tobacco: Never Used     Comment: quit smoking 7yr ago  . Alcohol use No  . Drug use: No  . Sexual activity: Not Currently   Other Topics Concern  . Not on file   Social History Narrative  . No narrative on file    Family History  Problem Relation Age of Onset  . Heart disease Mother     before age 70 . Diabetes Mother   . Heart attack Mother   . Stroke Mother   . Heart disease Father   . Colon cancer Neg Hx   . Stomach cancer Neg Hx   . Pancreatic cancer Neg Hx   . Esophageal cancer Neg Hx     Current Outpatient Prescriptions  Medication Sig Dispense Refill  . albuterol (PROVENTIL HFA;VENTOLIN HFA) 108 (90 Base) MCG/ACT  inhaler Inhale 2 puffs into the lungs every 6 (six) hours as needed for wheezing or shortness of breath. 1 Inhaler 0  . Amino Acids-Protein Hydrolys (FEEDING SUPPLEMENT, PRO-STAT SUGAR FREE 64,) LIQD Take 30 mLs by mouth 2 (two) times daily.    . benzonatate (TESSALON) 100 MG capsule Take 100 mg by mouth 3 (three) times daily as needed for cough.    . carvedilol (COREG) 3.125 MG tablet Take 1 tablet (3.125 mg total) by mouth 2 (two) times daily with a meal. 60 tablet 0  . clopidogrel (PLAVIX) 75 MG tablet  Take 1 tablet (75 mg total) by mouth daily. 90 tablet 1  . docusate sodium (COLACE) 100 MG capsule Take 1 capsule (100 mg total) by mouth 2 (two) times daily. 10 capsule 0  . furosemide (LASIX) 40 MG tablet Take 1 tablet (40 mg total) by mouth daily. 30 tablet 0  . hydrALAZINE (APRESOLINE) 25 MG tablet Take 1.5 tablets (37.5 mg total) by mouth every 8 (eight) hours. 90 tablet 0  . insulin aspart (NOVOLOG) 100 UNIT/ML injection Inject 0-15 Units into the skin 3 (three) times daily with meals. (Patient taking differently: Inject 2-11 Units into the skin 3 (three) times daily with meals. Per sliding scale: 101-150 = 2 units, 151-200 = 3 units, 201-250 = 5 units, 251-300 = 7 units, 301-350 = 9 units, >350 = 11 units, call MD for BS > 400 or < 60) 10 mL 11  . Insulin Detemir (LEVEMIR FLEXTOUCH) 100 UNIT/ML Pen Inject 25 Units into the skin daily at 10 pm.    . isosorbide mononitrate (IMDUR) 30 MG 24 hr tablet Take 1 tablet (30 mg total) by mouth daily. 30 tablet 0  . levalbuterol (XOPENEX) 0.63 MG/3ML nebulizer solution Take 0.63 mg by nebulization every 6 (six) hours as needed for wheezing or shortness of breath.    . Multiple Vitamins-Minerals (DECUBI-VITE) CAPS Take 1 capsule by mouth daily.    . pantoprazole (PROTONIX) 40 MG tablet Take 1 tablet (40 mg total) by mouth daily at 12 noon. 30 tablet 0  . potassium chloride (K-DUR) 10 MEQ tablet Take 1 tablet (10 mEq total) by mouth daily. 30 tablet 0  .  rivaroxaban (XARELTO) 20 MG TABS tablet Take 1 tablet (20 mg total) by mouth daily with supper. 30 tablet 0  . rosuvastatin (CRESTOR) 20 MG tablet Take 20 mg by mouth daily.    . vitamin C (ASCORBIC ACID) 500 MG tablet Take 500 mg by mouth 2 (two) times daily.     No current facility-administered medications for this visit.      Allergies  Allergen Reactions  . Statins Other (See Comments)    Reaction:  Muscle pain   . Amlodipine Besylate Other (See Comments)    Reaction:  Muscle pain   . Cephalexin Hives     REVIEW OF SYSTEMS:  (Positives checked otherwise negative)  CARDIOVASCULAR:   _0  chest pain,  _1  chest pressure,  _2  palpitations,  _3  shortness of breath when laying flat,  _4  shortness of breath with exertion,   _5  pain in feet when walking,  _6  pain in feet when laying flat, _7  history of blood clot in veins (DVT),  _8  history of phlebitis,  _9  swelling in legs,  _10  varicose veins  PULMONARY:   _11  productive cough,  _12  asthma,  _13  wheezing  NEUROLOGIC:   _14  weakness in arms or legs,  _15  numbness in arms or legs,  _16  difficulty speaking or slurred speech,  _17  temporary loss of vision in one eye,  _18  dizziness  HEMATOLOGIC:   _19  bleeding problems,  _20  problems with blood clotting too easily  MUSCULOSKEL:   _21  joint pain, _22  joint swelling  GASTROINTEST:   _23  vomiting blood,  _24  blood in stool     GENITOURINARY:   _25  burning with urination,  _26  blood in urine  PSYCHIATRIC:   _27  history of  major depression  INTEGUMENTARY:   _0  rashes,  _1  ulcers  CONSTITUTIONAL:   _2  fever,  _3  chills   Physical Examination  Vitals:   07/19/16 1110  Pulse: (!) 114  Resp: (!) 24  Temp: 98.2 F (36.8 C)  SpO2: 97%  Weight: 265 lb (120.2 kg)  Height: _4  (1.753 m)   Body mass index is 39.13 kg/m.  General: Alert, O x 3, WD,NAD  Pulmonary: Sym exp, good B air movt,rales on BLL  Cardiac: RRR, Nl S1, S2, no  Murmurs, No rubs, No S3,S4  Vascular: Vessel Right Left  Radial Not palpable Not palpable  Brachial Not palpable Not palpable  Carotid Palpable, No Bruit Palpable, No Bruit  Aorta Not palpable N/A  Femoral Faintly palpable Palpable  Popliteal Not palpable Not palpable  PT BKA BKA  DP BKA BKA   Gastrointestinal: soft, non-distended, non-tender to palpation, No guarding or rebound, no HSM, no masses, no CVAT B, No palpable prominent aortic pulse,    Musculoskeletal: B BKA: both bandaged, wounds not evaluated per pt's wishes  Neurologic: Pain and light touch intact in extremities except decreased in B BKA stumps, Motor exam as listed above   Medical Decision Making  BAZIL DHANANI is a 72 y.o. male who presents with: critical limb ischemia s/p B BKA, CAD (EF10%)   Unfortunately at this point, I don't know that this patient would survive any further surgery given the deteriorating cardiac function.  We discuss managing the B BKA wounds with wound care to avoid having to do general anesthesia for an AKA.  Unfortunately the R CFA will likely need intervention to avoid a hip dysarticulation.  I offered the patient a R femoral artery intervention, likely a covered stent placement in the R CFA and DCB angioplasty of the proximal peroneal artery.  I expect this to have limited patency but given his EF 10%, I think it is the better of some poor choices. I discussed with the patient the nature of angiographic procedures, especially the limited patencies of any endovascular intervention.   The patient is aware of that the risks of an angiographic procedure include but are not limited to: bleeding, infection, access site complications, renal failure, embolization, rupture of vessel, dissection, arteriovenous fistula, possible need for emergent surgical intervention, possible need for surgical procedures to treat the patient's pathology, anaphylactic reaction to contrast, and stroke and death.     The patient is aware of the risks and agrees to proceed.  Thank you for allowing [place] to participate in this patient's care.   Adele Barthel, MD, FACS [facility] [place] Office: [phone] Pager: [phone]

## 2016-07-19 ENCOUNTER — Ambulatory Visit (INDEPENDENT_AMBULATORY_CARE_PROVIDER_SITE_OTHER): Payer: Medicare HMO | Admitting: Vascular Surgery

## 2016-07-19 ENCOUNTER — Other Ambulatory Visit: Payer: Self-pay

## 2016-07-19 ENCOUNTER — Encounter: Payer: Self-pay | Admitting: Vascular Surgery

## 2016-07-19 VITALS — HR 114 | Temp 98.2°F | Resp 24 | Ht 69.0 in | Wt 265.0 lb

## 2016-07-19 DIAGNOSIS — I255 Ischemic cardiomyopathy: Secondary | ICD-10-CM | POA: Diagnosis not present

## 2016-07-19 DIAGNOSIS — I7025 Atherosclerosis of native arteries of other extremities with ulceration: Secondary | ICD-10-CM | POA: Diagnosis not present

## 2016-07-19 DIAGNOSIS — I998 Other disorder of circulatory system: Secondary | ICD-10-CM | POA: Diagnosis not present

## 2016-07-19 DIAGNOSIS — Z89511 Acquired absence of right leg below knee: Secondary | ICD-10-CM

## 2016-07-19 DIAGNOSIS — Z89512 Acquired absence of left leg below knee: Secondary | ICD-10-CM | POA: Diagnosis not present

## 2016-07-19 DIAGNOSIS — I5023 Acute on chronic systolic (congestive) heart failure: Secondary | ICD-10-CM | POA: Diagnosis not present

## 2016-07-19 DIAGNOSIS — N183 Chronic kidney disease, stage 3 (moderate): Secondary | ICD-10-CM | POA: Diagnosis not present

## 2016-07-25 ENCOUNTER — Ambulatory Visit (HOSPITAL_COMMUNITY)
Admission: RE | Admit: 2016-07-25 | Discharge: 2016-07-25 | Disposition: A | Discharge status: Home / Self Care | Payer: Medicare HMO | Source: Ambulatory Visit | Attending: Vascular Surgery | Admitting: Vascular Surgery

## 2016-07-25 ENCOUNTER — Telehealth: Payer: Self-pay | Admitting: Vascular Surgery

## 2016-07-25 ENCOUNTER — Encounter (HOSPITAL_COMMUNITY): Payer: Self-pay | Admitting: *Deleted

## 2016-07-25 ENCOUNTER — Encounter (HOSPITAL_COMMUNITY)
Admission: RE | Disposition: A | Discharge status: Home / Self Care | Payer: Self-pay | Source: Ambulatory Visit | Attending: Vascular Surgery

## 2016-07-25 DIAGNOSIS — Z86718 Personal history of other venous thrombosis and embolism: Secondary | ICD-10-CM | POA: Insufficient documentation

## 2016-07-25 DIAGNOSIS — I2510 Atherosclerotic heart disease of native coronary artery without angina pectoris: Secondary | ICD-10-CM | POA: Insufficient documentation

## 2016-07-25 DIAGNOSIS — E785 Hyperlipidemia, unspecified: Secondary | ICD-10-CM | POA: Insufficient documentation

## 2016-07-25 DIAGNOSIS — E1151 Type 2 diabetes mellitus with diabetic peripheral angiopathy without gangrene: Secondary | ICD-10-CM | POA: Diagnosis not present

## 2016-07-25 DIAGNOSIS — Z794 Long term (current) use of insulin: Secondary | ICD-10-CM | POA: Insufficient documentation

## 2016-07-25 DIAGNOSIS — I4892 Unspecified atrial flutter: Secondary | ICD-10-CM | POA: Diagnosis not present

## 2016-07-25 DIAGNOSIS — Z89611 Acquired absence of right leg above knee: Secondary | ICD-10-CM | POA: Insufficient documentation

## 2016-07-25 DIAGNOSIS — Z7902 Long term (current) use of antithrombotics/antiplatelets: Secondary | ICD-10-CM | POA: Insufficient documentation

## 2016-07-25 DIAGNOSIS — L97119 Non-pressure chronic ulcer of right thigh with unspecified severity: Secondary | ICD-10-CM | POA: Insufficient documentation

## 2016-07-25 DIAGNOSIS — Z955 Presence of coronary angioplasty implant and graft: Secondary | ICD-10-CM | POA: Insufficient documentation

## 2016-07-25 DIAGNOSIS — N189 Chronic kidney disease, unspecified: Secondary | ICD-10-CM | POA: Insufficient documentation

## 2016-07-25 DIAGNOSIS — Z89512 Acquired absence of left leg below knee: Secondary | ICD-10-CM

## 2016-07-25 DIAGNOSIS — Z87891 Personal history of nicotine dependence: Secondary | ICD-10-CM | POA: Diagnosis not present

## 2016-07-25 DIAGNOSIS — Z7901 Long term (current) use of anticoagulants: Secondary | ICD-10-CM | POA: Insufficient documentation

## 2016-07-25 DIAGNOSIS — I70231 Atherosclerosis of native arteries of right leg with ulceration of thigh: Secondary | ICD-10-CM | POA: Diagnosis not present

## 2016-07-25 DIAGNOSIS — I252 Old myocardial infarction: Secondary | ICD-10-CM | POA: Insufficient documentation

## 2016-07-25 DIAGNOSIS — I130 Hypertensive heart and chronic kidney disease with heart failure and stage 1 through stage 4 chronic kidney disease, or unspecified chronic kidney disease: Secondary | ICD-10-CM | POA: Diagnosis not present

## 2016-07-25 DIAGNOSIS — E1122 Type 2 diabetes mellitus with diabetic chronic kidney disease: Secondary | ICD-10-CM | POA: Diagnosis not present

## 2016-07-25 DIAGNOSIS — I70209 Unspecified atherosclerosis of native arteries of extremities, unspecified extremity: Secondary | ICD-10-CM | POA: Diagnosis present

## 2016-07-25 DIAGNOSIS — Z951 Presence of aortocoronary bypass graft: Secondary | ICD-10-CM | POA: Insufficient documentation

## 2016-07-25 DIAGNOSIS — Z8249 Family history of ischemic heart disease and other diseases of the circulatory system: Secondary | ICD-10-CM | POA: Diagnosis not present

## 2016-07-25 DIAGNOSIS — Z8673 Personal history of transient ischemic attack (TIA), and cerebral infarction without residual deficits: Secondary | ICD-10-CM | POA: Diagnosis not present

## 2016-07-25 DIAGNOSIS — I70401 Unspecified atherosclerosis of autologous vein bypass graft(s) of the extremities, right leg: Secondary | ICD-10-CM | POA: Diagnosis not present

## 2016-07-25 DIAGNOSIS — I255 Ischemic cardiomyopathy: Secondary | ICD-10-CM | POA: Insufficient documentation

## 2016-07-25 DIAGNOSIS — I5022 Chronic systolic (congestive) heart failure: Secondary | ICD-10-CM | POA: Insufficient documentation

## 2016-07-25 DIAGNOSIS — E1142 Type 2 diabetes mellitus with diabetic polyneuropathy: Secondary | ICD-10-CM | POA: Insufficient documentation

## 2016-07-25 DIAGNOSIS — Z89612 Acquired absence of left leg above knee: Secondary | ICD-10-CM | POA: Insufficient documentation

## 2016-07-25 DIAGNOSIS — Z89511 Acquired absence of right leg below knee: Secondary | ICD-10-CM

## 2016-07-25 HISTORY — PX: LOWER EXTREMITY ANGIOGRAPHY: CATH118251

## 2016-07-25 HISTORY — PX: PERIPHERAL VASCULAR BALLOON ANGIOPLASTY: CATH118281

## 2016-07-25 HISTORY — PX: PERIPHERAL VASCULAR INTERVENTION: CATH118257

## 2016-07-25 LAB — POCT I-STAT, CHEM 8
BUN: 20 mg/dL (ref 6–20)
CREATININE: 1.4 mg/dL — AB (ref 0.61–1.24)
Calcium, Ion: 1.03 mmol/L — ABNORMAL LOW (ref 1.15–1.40)
Chloride: 105 mmol/L (ref 101–111)
GLUCOSE: 122 mg/dL — AB (ref 65–99)
HCT: 35 % — ABNORMAL LOW (ref 39.0–52.0)
Hemoglobin: 11.9 g/dL — ABNORMAL LOW (ref 13.0–17.0)
POTASSIUM: 4.3 mmol/L (ref 3.5–5.1)
Sodium: 136 mmol/L (ref 135–145)
TCO2: 24 mmol/L (ref 0–100)

## 2016-07-25 LAB — POCT ACTIVATED CLOTTING TIME
ACTIVATED CLOTTING TIME: 252 s
ACTIVATED CLOTTING TIME: 268 s
ACTIVATED CLOTTING TIME: 202 s
ACTIVATED CLOTTING TIME: 186 s
Activated Clotting Time: 191 seconds

## 2016-07-25 LAB — GLUCOSE, CAPILLARY
Glucose-Capillary: 122 mg/dL — ABNORMAL HIGH (ref 65–99)
Glucose-Capillary: 101 mg/dL — ABNORMAL HIGH (ref 65–99)

## 2016-07-25 SURGERY — LOWER EXTREMITY ANGIOGRAPHY
Anesthesia: LOCAL

## 2016-07-25 MED ORDER — HEPARIN SODIUM (PORCINE) 1000 UNIT/ML IJ SOLN
INTRAMUSCULAR | Status: AC
  Filled 2016-07-25: qty 2

## 2016-07-25 MED ORDER — IODIXANOL 320 MG/ML IV SOLN
INTRAVENOUS | Status: DC | PRN
  Administered 2016-07-25: 30 mL via INTRA_ARTERIAL

## 2016-07-25 MED ORDER — LIDOCAINE HCL (PF) 1 % IJ SOLN
INTRAMUSCULAR | Status: DC | PRN
  Administered 2016-07-25: 15 mL

## 2016-07-25 MED ORDER — HEPARIN SODIUM (PORCINE) 1000 UNIT/ML IJ SOLN
INTRAMUSCULAR | Status: DC | PRN
  Administered 2016-07-25: 12000 [IU] via INTRAVENOUS

## 2016-07-25 MED ORDER — MIDAZOLAM HCL 2 MG/2ML IJ SOLN
INTRAMUSCULAR | Status: AC
  Filled 2016-07-25: qty 2

## 2016-07-25 MED ORDER — ACETAMINOPHEN 325 MG PO TABS: 650.0000 mg | ORAL_TABLET | ORAL | Status: DC | PRN

## 2016-07-25 MED ORDER — SODIUM CHLORIDE 0.9 % IV SOLN: 250.0000 mL | INTRAVENOUS | Status: DC | PRN

## 2016-07-25 MED ORDER — SODIUM CHLORIDE 0.9% FLUSH: 3.0000 mL | Freq: Two times a day (BID) | INTRAVENOUS | Status: DC

## 2016-07-25 MED ORDER — FENTANYL CITRATE (PF) 100 MCG/2ML IJ SOLN
INTRAMUSCULAR | Status: DC | PRN
  Administered 2016-07-25: 25 ug via INTRAVENOUS

## 2016-07-25 MED ORDER — SODIUM CHLORIDE 0.9% FLUSH: 3.0000 mL | INTRAVENOUS | Status: DC | PRN

## 2016-07-25 MED ORDER — SODIUM CHLORIDE 0.9 % IV SOLN: INTRAVENOUS | Status: DC

## 2016-07-25 MED ORDER — MIDAZOLAM HCL 2 MG/2ML IJ SOLN
INTRAMUSCULAR | Status: DC | PRN
  Administered 2016-07-25: 0.5 mg via INTRAVENOUS

## 2016-07-25 MED ORDER — FENTANYL CITRATE (PF) 100 MCG/2ML IJ SOLN
INTRAMUSCULAR | Status: AC
  Filled 2016-07-25: qty 2

## 2016-07-25 MED ORDER — LIDOCAINE HCL 1 % IJ SOLN
INTRAMUSCULAR | Status: AC
  Filled 2016-07-25: qty 20

## 2016-07-25 MED ORDER — HEPARIN (PORCINE) IN NACL 2-0.9 UNIT/ML-% IJ SOLN
INTRAMUSCULAR | Status: AC
Start: 2016-07-25 — End: 2016-07-25
  Filled 2016-07-25: qty 1000

## 2016-07-25 SURGICAL SUPPLY — 30 items
BAG SNAP BAND KOVER 36X36 (MISCELLANEOUS) ×1 IMPLANT
BALLN ARMADA 18 4.0X40X150 (BALLOONS) ×3
BALLN ARMADA 2X40X150 (BALLOONS) ×3
BALLN LUTONIX DCB 5X40X130 (BALLOONS) ×3
BALLOON ARMADA 18 4.0X40X150 (BALLOONS) IMPLANT
BALLOON ARMADA 2X40X150 (BALLOONS) IMPLANT
BALLOON LUTONIX DCB 5X40X130 (BALLOONS) IMPLANT
CATH NAVICROSS ANGLED 90CM (MICROCATHETER) ×1 IMPLANT
CATH OMNI FLUSH 5F 65CM (CATHETERS) ×1 IMPLANT
CATH STRAIGHT 5FR 65CM (CATHETERS) ×1 IMPLANT
DEVICE TORQUE .025-.038 (MISCELLANEOUS) ×1 IMPLANT
DILATOR VESSEL 38 20CM 7FR (INTRODUCER) ×1 IMPLANT
GUIDEWIRE ANGLED .035X260CM (WIRE) ×1 IMPLANT
KIT ENCORE 26 ADVANTAGE (KITS) ×1 IMPLANT
KIT MICROINTRODUCER STIFF 5F (SHEATH) ×1 IMPLANT
KIT PV (KITS) ×3 IMPLANT
SHEATH FLEX ANSEL ANG 6F 45CM (SHEATH) ×1 IMPLANT
SHEATH PINNACLE 5F 10CM (SHEATH) ×1 IMPLANT
SHEATH PINNACLE 6F 10CM (SHEATH) ×1 IMPLANT
SHEATH PINNACLE 8F 10CM (SHEATH) ×1 IMPLANT
SHEATH PINNACLE MP 6F 45CM (SHEATH) ×1 IMPLANT
STENT ICAST 6X22X120 (Permanent Stent) ×1 IMPLANT
TAPE VIPERTRACK RADIOPAQ (MISCELLANEOUS) IMPLANT
TAPE VIPERTRACK RADIOPAQUE (MISCELLANEOUS) ×3
TRANSDUCER W/STOPCOCK (MISCELLANEOUS) ×3 IMPLANT
TRAY PV CATH (CUSTOM PROCEDURE TRAY) ×3 IMPLANT
WIRE AMPLATZ SS-J .035X180CM (WIRE) ×1 IMPLANT
WIRE BENTSON .035X145CM (WIRE) ×1 IMPLANT
WIRE G V18X300CM (WIRE) ×1 IMPLANT
WIRE ROSEN-J .035X180CM (WIRE) ×1 IMPLANT

## 2016-07-25 NOTE — Interval H&P Note (Signed)
   History and Physical Update  The patient was interviewed and re-examined.  The patient's previous History and Physical has been reviewed and is unchanged from my consult except for: interval angiogram and interval cardiology evaluation.  Due to poor cardiac function, I recommended try to intervene on the right common femoral artery stenosis with a stent rather than pursue a repeat surgical endarterectomy and patch angioplasty.  I suspect his poor cardiac function would put him at high surgical risk.   The patient is scheduled for R leg runoff with intervention.  I discussed with the patient the nature of angiographic procedures, especially the limited patencies of any endovascular intervention.    The patient is aware of that the risks of an angiographic procedure include but are not limited to: bleeding, infection, access site complications, renal failure, embolization, rupture of vessel, dissection, arteriovenous fistula, possible need for emergent surgical intervention, possible need for surgical procedures to treat the patient's pathology, anaphylactic reaction to contrast, and stroke and death.    The patient is aware of the risks and agrees to proceed.  [name], MD, FACS [facility] [place] Office: [phone] Pager: [phone]  07/25/2016, 6:43 AM

## 2016-07-25 NOTE — Progress Notes (Signed)
Spoke with staff at [facility] to verify medication"s date and time taken.  CBG prior to leaving their facility 139

## 2016-07-25 NOTE — H&P (View-Only) (Signed)
Established Critical Limb Ischemia Patient  History of Present Illness  Leroy [name] is a 71 y.o. 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  The patient is being seen by wound care.  The wound care physician thinks this patient has compromised inflow to both BKA. The patient current moves in a wheelchair and does not use his prosthesis.  He is not exactly certain of chronology of his disease.  This pt returns from his recent appt for his vascular studies.   Past Medical History:  Diagnosis Date  . Acute respiratory failure with hypoxia and hypercapnia ([place]) 05/31/2016  . Atrial flutter ([place])   . Cataract   . Chronic kidney disease    on Lisinopril to protect kidneys d/t being on Metformin per pt  . Chronic systolic CHF (congestive heart failure) ([place])   . Coronary artery disease    a. s/p remote CABG 2001 at [facility] b. Cath 03/2010: for med rx.;  c.  Carlton Adam myoview (1/14):  Large Inferior apical MI, very small area of peri-infarct ischemia toward the inferior apical segment. EF 19%.  Med Rx was continued.    . Diabetes mellitus 1979   takes Metformni daily  and Lantus 50units bid  . DVT (deep venous thrombosis) ([place]) 2011   legs  . H/O hiatal hernia   . H/O medication noncompliance    Due to insurance issues  . History of blood clots    in legs--this was in 2011--has been off of Coumadin 29month;takes Xarelto daily  . Hyperlipidemia   . Hypotension   . Ischemic cardiomyopathy    a. EF 40% 2010. b. Echo (2/14):  mild LVH, EF 30-35%, diff HK, Gr 1 DD, MAC, mild MR, mod LAE, PASP 39  . Joint pain   . Myocardial infarction    total of 8 heart attacks;last one in 2011  . Paroxysmal atrial fibrillation (H[facility])   . Peripheral neuropathy ([place]   . Peripheral vascular disease ([place]    a. s/p L BKA 2014.  .Marland KitchenShortness of breath dyspnea   . Stroke ([facility]     Past Surgical History:  Procedure Laterality Date  . ABDOMINAL AORTAGRAM  04-30-12   . ABDOMINAL AORTAGRAM N/A 04/30/2012   Procedure: ABDOMINAL AMaxcine Ham  Surgeon: BConrad Petersburg MD;  [facility];  Service: Cardiovascular;  Laterality: N/A;  . ADENOIDECTOMY    . AMPUTATION Left 06/03/2012   Procedure: AMPUTATION BELOW KNEE;  Surgeon: BConrad [place]town MD;  [place] [facility]  Service: Vascular;  Laterality: Left;  . AMPUTATION Right 04/14/2013   Procedure: AMPUTATION BELOW KNEE ;  Surgeon: BConrad Jessie MD;  [place] [facility]  Service: Vascular;  Laterality: Right;  . APPLICATION OF WOUND VAC Right 04/20/2014   Procedure: APPLICATION OF WOUND VAC;  Surgeon: BConrad Cactus MD;  [facility]  Service: Vascular;  Laterality: Right;  . CARDIAC CATHETERIZATION  03/19/10  . CARDIAC CATHETERIZATION N/A 02/22/2016   Procedure: Left Heart Cath and Cors/Grafts Angiography;  Surgeon: JJettie Booze MD;  [facility];  Service: Cardiovascular;  Laterality: N/A;  . CARDIAC CATHETERIZATION N/A 02/22/2016   Procedure: Coronary Stent Intervention;  Surgeon: JJettie Booze MD;  [facility];  Service: Cardiovascular;  Laterality: N/A;  . CARDIAC CATHETERIZATION    . COLONOSCOPY WITH PROPOFOL N/A 10/24/2015   Procedure: COLONOSCOPY WITH PROPOFOL;  Surgeon: DMilus Banister MD;  [facility];  Service:  Endoscopy;  Laterality: N/A;  . CORONARY ANGIOPLASTY  2002   3 stents  . CORONARY ARTERY BYPASS GRAFT  03/2000   quadruple  . ENDARTERECTOMY FEMORAL Right 03/25/2013   Procedure: ENDARTERECTOMY FEMORAL ;  Surgeon: Conrad Liberty, MD;  Location: [place];  Service: Vascular;  Laterality: Right;  . EYE SURGERY Right    Catarct  . HERNIA REPAIR     Hiatal Hernia  . I&D EXTREMITY Right 04/14/2013   Procedure: IRRIGATION AND DEBRIDEMENT OF RIGHT  GROIN & PLACEMENT OF VAC DRESSING;  Surgeon: Conrad Muscoda, MD;  Location: [place];  Service: Vascular;  Laterality: Right;  . I&D EXTREMITY Right 04/20/2014   Procedure: DEBRIDEMENT OF RIGHT BELOW KNEE  AMPUTATION STUMP;  Surgeon: Conrad San Miguel, MD;  Location: [place];  Service: Vascular;  Laterality: Right;  . LOWER EXTREMITY ANGIOGRAM Bilateral 04/30/2012   Procedure: LOWER EXTREMITY ANGIOGRAM;  Surgeon: Conrad [place], MD;  Location: [facility] CATH LAB;  Service: Cardiovascular;  Laterality: Bilateral;  bilat lower extrem angio  . PATCH ANGIOPLASTY Right 03/25/2013   Procedure: PATCH ANGIOPLASTY USING VASCU-GUARD PERIPHERAL VASCULAR PATCH;  Surgeon: Conrad Anch[place], MD;  Location: [place];  Service: Vascular;  Laterality: Right;  . revasculariztion  2011/2010   x 2   . TONSILLECTOMY    . WOUND DEBRIDEMENT Right 04/20/2014   s/p  rt bka  with application of wound vac    Social History   Social History  . Marital status: Legally Separated    Spouse name: N/A  . Number of children: N/A  . Years of education: N/A   Occupational History  . Not on file.   Social History Main Topics  . Smoking status: Former Smoker    Types: Cigarettes  . Smokeless tobacco: Never Used     Comment: quit smoking 54yr ago  . Alcohol use No  . Drug use: No  . Sexual activity: Not Currently   Other Topics Concern  . Not on file   Social History Narrative  . No narrative on file    Family History  Problem Relation Age of Onset  . Heart disease Mother     before age 70 . Diabetes Mother   . Heart attack Mother   . Stroke Mother   . Heart disease Father   . Colon cancer Neg Hx   . Stomach cancer Neg Hx   . Pancreatic cancer Neg Hx   . Esophageal cancer Neg Hx      Current Outpatient Prescriptions  Medication Sig Dispense Refill  . albuterol (PROVENTIL HFA;VENTOLIN HFA) 108 (90 Base) MCG/ACT inhaler Inhale 2 puffs into the lungs every 6 (six) hours as needed for wheezing or shortness of breath. 1 Inhaler 0  . benzonatate (TESSALON) 100 MG capsule Take 100 mg by mouth 3 (three) times daily as needed for cough.    . carvedilol (COREG) 3.125 MG tablet Take 1 tablet (3.125 mg total) by mouth 2 (two) times  daily with a meal. 60 tablet 0  . clopidogrel (PLAVIX) 75 MG tablet Take 1 tablet (75 mg total) by mouth daily. 90 tablet 1  . docusate sodium (COLACE) 100 MG capsule Take 1 capsule (100 mg total) by mouth 2 (two) times daily. 10 capsule 0  . furosemide (LASIX) 40 MG tablet Take 1 tablet (40 mg total) by mouth daily. 30 tablet 0  . hydrALAZINE (APRESOLINE) 25 MG tablet Take 1.5 tablets (37.5 mg total) by mouth every 8 (eight) hours. 90 tablet  0  . insulin aspart (NOVOLOG) 100 UNIT/ML injection Inject 0-15 Units into the skin 3 (three) times daily with meals. 10 mL 11  . insulin aspart (NOVOLOG) 100 UNIT/ML injection Inject 3 Units into the skin 3 (three) times daily with meals. 10 mL 11  . insulin glargine (LANTUS) 100 UNIT/ML injection Inject 0.25 mLs (25 Units total) into the skin daily at 10 pm. 10 mL 11  . ipratropium-albuterol (DUONEB) 0.5-2.5 (3) MG/3ML SOLN Take 3 mLs by nebulization every 4 (four) hours as needed. 360 mL 0  . isosorbide mononitrate (IMDUR) 30 MG 24 hr tablet Take 1 tablet (30 mg total) by mouth daily. 30 tablet 0  . pantoprazole (PROTONIX) 40 MG tablet Take 1 tablet (40 mg total) by mouth daily at 12 noon. 30 tablet 0  . potassium chloride (K-DUR) 10 MEQ tablet Take 1 tablet (10 mEq total) by mouth daily. 30 tablet 0  . rosuvastatin (CRESTOR) 20 MG tablet Take 20 mg by mouth daily.    . rivaroxaban (XARELTO) 20 MG TABS tablet Take 1 tablet (20 mg total) by mouth daily with supper. (Patient not taking: Reported on 06/28/2016) 30 tablet 0   No current facility-administered medications for this visit.      Allergies  Allergen Reactions  . Statins Other (See Comments)    Reaction:  Muscle pain   . Amlodipine Besylate Other (See Comments)    Reaction:  Muscle pain   . Cephalexin Hives     REVIEW OF SYSTEMS:  (Positives checked otherwise negative)  CARDIOVASCULAR:   _0  chest pain,  _1  chest pressure,  _2  palpitations,  _3  shortness of breath when laying flat,    _4  shortness of breath with exertion,   _5  pain in feet when walking,  _6  pain in feet when laying flat, _7  history of blood clot in veins (DVT),  _8  history of phlebitis,  _9  swelling in legs,  _10  varicose veins  PULMONARY:   _11  productive cough,  _12  asthma,  _13  wheezing  NEUROLOGIC:   _14  weakness in arms or legs,  _15  numbness in arms or legs,  _16  difficulty speaking or slurred speech,  _17  temporary loss of vision in one eye,  _18  dizziness  HEMATOLOGIC:   _19  bleeding problems,  _20  problems with blood clotting too easily  MUSCULOSKEL:   _21  joint pain, _22  joint swelling  GASTROINTEST:   _23  vomiting blood,  _24  blood in stool     GENITOURINARY:   _25  burning with urination,  _26  blood in urine  PSYCHIATRIC:   _27  history of major depression  INTEGUMENTARY:   _28  rashes,  _29  ulcers  CONSTITUTIONAL:   _30  fever,  _31  chills   Physical Examination  Vitals:   06/28/16 0843  Pulse: (!) 104  Resp: 20  Temp: 97.4 F (36.3 C)  TempSrc: Oral  SpO2: 97%  Weight: 233 lb (105.7 kg)  Height: _32  (1.753 m)   BP could not be measured on any limb  General: alert, WDWN  Eyes: PERRL, EOMI  Pulmonary: Sym exp, good air movt, CTAB, no rales, rhonchi, & wheezing  Cardiac: RRR, Nl S1, S2, no Murmurs, rubs or gallops  Vascular: Vessel Right Left  Radial Palpable Palpable  Brachial Palpable Palpable  Carotid Palpable, without bruit Palpable, without bruit  Aorta Not palpable  N/A  Femoral Not Palpable due to position Not Palpable due to position  Popliteal Not palpable Not palpable  PT BKA BKA  DP BKA BKA   Gastrointestinal: soft, NTND, -G/R, - HSM, - masses, - CVAT B  Musculoskeletal: M/S 5/5 throughout BUE, able to move both BKA spontaneous, new dressings on both BKA, visible tissue is cold to touch  Neurologic: Pain and light touch intact in extremities , Motor exam as listed above  BLE arterial duplex  (06/25/16):  Sub-optimal study  R: mono flow in CFA , PFA, and SFA  L: mono flow in L CFA with hemodynamically significant stenosis in CFA and PFA, occluded SFA  Inflow disease suspected   Medical Decision Making  Leroy [name] is a 71 y.o. ([birth year]) male who presents with: ischemic B BKA   We discussed his options.  Pt elected proceeding with aortogram, bilateral iliac angiogram, possible intervention. I discussed with the patient the nature of angiographic procedures, especially the limited patencies of any endovascular intervention.   The patient is aware of that the risks of an angiographic procedure include but are not limited to: bleeding, infection, access site complications, renal failure, embolization, rupture of vessel, dissection, arteriovenous fistula, possible need for emergent surgical intervention, possible need for surgical procedures to treat the patient's pathology, anaphylactic reaction to contrast, and stroke and death.   The patient is aware of the risks and agrees to proceed.  He scheduled for this coming Monday 26 MAR 18  Thank you for allowing [place] to participate in this patient's care.   Adele Barthel, MD, FACS [facility] Office: [phone] Pager: [phone]

## 2016-07-25 NOTE — Progress Notes (Signed)
81fr long sheath was removed from the patient's left femoral artery using manual pressure.  Pressure was held for 25 minutes without complications.  Groin site was soft and unremarkable.  Tegaderm was applied to the site and patient tolerated well.  Post vital signs: BP 110/55, O2 SATS 98%, HR 111.  Post care instructions given to patient upon completion.

## 2016-07-25 NOTE — Telephone Encounter (Signed)
-----   Message from Sharee Pimple, RN sent at 07/25/2016  9:55 AM EDT ----- Regarding: 4-6 weeks   ----- Message ----- From: [name], MD Sent: 07/25/2016   9:46 AM To: [address]  Leroy [name] [id number] 1945/12/26   PROCEDURE: 1.  Left common femoral artery cannulation under ultrasound guidance 2.  Placement of catheter in aorta 3.  Third order arterial selection 4.  Limited right leg runoff via sheath 5.  Angioplasty and stenting right distal common femoral artery (iCAST 6 mm x 22 mm) 6.  Bland angioplasty of right profunda femoral artery x 2 (2 mm x 40 mm, 4 mm x 40 mm) 7.  Drug coated angioplasty of right profunda femoral artery (5 mm x 40 mm) 8.  Conscious sedation for 78 minutes  Follow-up: 4-6 weeks

## 2016-07-25 NOTE — Progress Notes (Addendum)
Fluids are ordered @100  ml/hr, pt has a new full bag hanging on arrival to [facility] and is running @10  cc/hr.  Dr Imogene Burn is paged to confirm fluid rate / EF 10%. 1400 Dr Imogene Burn said to stop IV fluids/

## 2016-07-25 NOTE — Telephone Encounter (Signed)
LVM on home # for pt to call and confirm appt, lttr mailed for appt 6/1

## 2016-07-25 NOTE — Op Note (Signed)
OPERATIVE NOTE   PROCEDURE: 1.  Left common femoral artery cannulation under ultrasound guidance 2.  Placement of catheter in aorta 3.  Third order arterial selection 4.  Limited right leg runoff via sheath 5.  Angioplasty and stenting right distal common femoral artery (iCAST 6 mm x 22 mm) 6.  Bland angioplasty of right profunda femoral artery x 2 (2 mm x 40 mm, 4 mm x 40 mm) 7.  Drug coated angioplasty of right profunda femoral artery (5 mm x 40 mm) 8.  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 male who presents with bilateral above-knee amputation ulcers.  His prior angiography demonstrated sub-total occlusions in right common femoral artery and profunda femoral artery.  The patient was sent to preoperative Cardiology risk stratification where his EF was found to be only 10% making him high risk for a redo right iliofemoral endarterectomy and patch angioplasty. The patient presents for: attempt at endovascular therapy of the sub-total occlusion in right common femoral artery and profunda femoral artery.  I discussed with the patient the nature of angiographic procedures, especially the limited patencies of any endovascular intervention.  The patient is aware of that the risks of an angiographic procedure include but are not limited to: bleeding, infection, access site complications, renal failure, embolization, rupture of vessel, dissection, possible  need for emergent surgical intervention, possible need for surgical procedures to treat the patient's pathology, and stroke and death.  The patient is aware of the risks and agrees to proceed.  DESCRIPTION: After full informed consent was obtained from the patient, the patient was brought back to the angiography suite.  The patient was placed supine upon the angiography table and connected to cardiopulmonary monitoring equipment.  The patient was then given conscious sedation, the amounts of which are documented in the patient's chart.  A circulating radiologic technician maintained continuous monitoring of the patient's cardiopulmonary status.  Additionally, the control room radiologic technician provided backup monitoring throughout the procedure.  The patient was prepped and drape in the standard fashion for an angiographic procedure.  At this point, attention was turned to the left groin.  Under ultrasound guidance, the subcutaneous tissue surrounding the left common femoral artery was anesthesized with 1% lidocaine with epinephrine.  The artery was then cannulated with a micropuncture needle.  The microwire was advanced into the iliac arterial system.  The needle was exchanged for a microsheath, which was loaded into the common femoral artery over the wire.  The microwire was exchanged for a Bentson wire which was advanced into the aorta.  The microsheath was then exchanged for a 5-Fr sheath which was loaded into the common femoral artery.  The dilator and microwire were exchanged for an Amplatz wire which was loaded into the aorta.  Despite easy dilation with the 5-Fr dilator, the sheath would not pass, so I had to predilate the tract with a 6-Fr dilator.  I was finally able to get a 5-Fr sheath up the left common femoral artery.  Using a Bentson wire and Omniflush catheter, the right common iliac artery was selected.  The wire was advanced into the external iliac artery but the catheter would not  track.  I exchanged the catheter for a straight catheter, which did advance into the external iliac artery.  I exchanged the wire for a Rosen wire which I lodged into the common femoral artery.  I tried to exchange the sheath for a 6-Fr Destination and Ansel sheath but neither would advance into the artery.  I had to reloaded the 5-Fr sheath and then exchange the wire would with the straighter catheter for the Amplatz wire.  I then predilated the left groin tract with a 7-Fr and 8-Fr dilators.  This finally allowed me to advance the 6-Fr Destination sheath into the right external iliac artery.  The patient was given 12000 units of Heparin intravenously, which was a therapeutic bolus.   I placed the straight catheter over the wire and exchanged it for a long Glidewire.  Using this I was able to navigate past the distal right common femoral artery sub-total occlusion.  I did hand injections to image the location and size of the stenosis and arteries.  Based on the measurements, I selected a iCAST 6 mm x 22 mm.   I centered the stent on the stenosis and deployed the stent at 8 atm for 1 minute.  The balloon was deflated and removed.  Completion injection demonstrated resolution of the stenosis.  At this point, I loaded an angled Navicross catheter over the wire.  Using this combination, I navigated with some difficulty into the main channel of the profunda femoral artery.  I exchanged the wire for a V18.  I did a hand injection to demonstrate the location and size of the arteries surrounding the sub-total occlusion of the profunda femoral artery.  I selected first a 2 mm 40 mm balloon.  This was centered on the stenosis and inflated to 8 atm for 1 minute.  I then deflated the balloon and exchanged it for a 4 mm x 40 mm balloon.  This was inflated for 2 minutes at 8 atm.  I deflated the balloon and removed it.  Completion angiogram demonstrates no dissection.  I elected to treat this stenosis with a 5 mm x 40 mm  Lutonix drug coat balloon to try to get additional patency given this patient's poor cardiac status.  The balloon was inflated at 6 atm for 3 minutes, followed by 2 atm for 2 minutes.  The balloon was deflated and removed.  I did a final completion angiogram which demonstrated <30% residual stenosis in the profunda femoral artery orifice.    At this point, I pulled the wire out and pulled the sheath back into the left external iliac artery.  The sheath was aspirated.  No clots were present and the sheath was reloaded with heparinized saline.  The sheath will be removed once anticoagulation has reversed.   COMPLICATIONS: none  CONDITION: stable   Adele Barthel, MD, [facility] Office: [phone] Pager: [phone]  07/25/2016, 9:28 AM

## 2016-07-25 NOTE — Discharge Instructions (Signed)
Restart xarelto tomorrow 4/20  Angiogram, Care After This sheet gives you information about how to care for yourself after your procedure. Your health care provider may also give you more specific instructions. If you have problems or questions, contact your health care provider. What can I expect after the procedure? After the procedure, it is common to have bruising and tenderness at the catheter insertion area. Follow these instructions at home: Insertion site care   Follow instructions from your health care provider about how to take care of your insertion site. Make sure you:  Wash your hands with soap and water before you change your bandage (dressing). If soap and water are not available, use hand sanitizer.  Change your dressing as told by your health care provider.  Leave stitches (sutures), skin glue, or adhesive strips in place. These skin closures may need to stay in place for 2 weeks or longer. If adhesive strip edges start to loosen and curl up, you may trim the loose edges. Do not remove adhesive strips completely unless your health care provider tells you to do that.  Do not take baths, swim, or use a hot tub until your health care provider approves.  You may shower 24-48 hours after the procedure or as told by your health care provider.  Gently wash the site with plain soap and water.  Pat the area dry with a clean towel.  Do not rub the site. This may cause bleeding.  Do not apply powder or lotion to the site. Keep the site clean and dry.  Check your insertion site every day for signs of infection. Check for:  Redness, swelling, or pain.  Fluid or blood.  Warmth.  Pus or a bad smell. Activity   Rest as told by your health care provider, usually for 1-2 days.  Do not lift anything that is heavier than 10 lbs. (4.5 kg) or as told by your health care provider.  Do not drive for 24 hours if you were given a medicine to help you relax (sedative).  Do not  drive or use heavy machinery while taking prescription pain medicine. General instructions   Return to your normal activities as told by your health care provider, usually in about a week. Ask your health care provider what activities are safe for you.  If the catheter site starts bleeding, lie flat and put pressure on the site. If the bleeding does not stop, get help right away. This is a medical emergency.  Drink enough fluid to keep your urine clear or pale yellow. This helps flush the contrast dye from your body.  Take over-the-counter and prescription medicines only as told by your health care provider.  Keep all follow-up visits as told by your health care provider. This is important. Contact a health care provider if:  You have a fever or chills.  You have redness, swelling, or pain around your insertion site.  You have fluid or blood coming from your insertion site.  The insertion site feels warm to the touch.  You have pus or a bad smell coming from your insertion site.  You have bruising around the insertion site.  You notice blood collecting in the tissue around the catheter site (hematoma). The hematoma may be painful to the touch. Get help right away if:  You have severe pain at the catheter insertion area.  The catheter insertion area swells very fast.  The catheter insertion area is bleeding, and the bleeding does not stop when  you hold steady pressure on the area.  The area near or just beyond the catheter insertion site becomes pale, cool, tingly, or numb. These symptoms may represent a serious problem that is an emergency. Do not wait to see if the symptoms will go away. Get medical help right away. Call your local emergency services (911 in the [place] Do not drive yourself to the hospital. Summary  After the procedure, it is common to have bruising and tenderness at the catheter insertion area.  After the procedure, it is important to rest and drink plenty  of fluids.  Do not take baths, swim, or use a hot tub until your health care provider says it is okay to do so. You may shower 24-48 hours after the procedure or as told by your health care provider.  If the catheter site starts bleeding, lie flat and put pressure on the site. If the bleeding does not stop, get help right away. This is a medical emergency. This information is not intended to replace advice given to you by your health care provider. Make sure you discuss any questions you have with your health care provider. Document Released: 10/11/2004 Document Revised: 02/28/2016 Document Reviewed: 02/28/2016 Elsevier Interactive Patient Education  2017 [employer].

## 2016-07-26 ENCOUNTER — Encounter (HOSPITAL_COMMUNITY): Payer: Self-pay | Admitting: Vascular Surgery

## 2016-07-27 ENCOUNTER — Emergency Department (HOSPITAL_BASED_OUTPATIENT_CLINIC_OR_DEPARTMENT_OTHER)
Admit: 2016-07-27 | Discharge: 2016-07-27 | Disposition: A | Discharge status: Home / Self Care | Payer: Medicare HMO | Attending: Emergency Medicine | Admitting: Emergency Medicine

## 2016-07-27 ENCOUNTER — Emergency Department (HOSPITAL_COMMUNITY): Payer: Medicare HMO

## 2016-07-27 ENCOUNTER — Emergency Department (HOSPITAL_COMMUNITY)
Admission: EM | Admit: 2016-07-27 | Discharge: 2016-07-27 | Disposition: A | Discharge status: Home / Self Care | Payer: Medicare HMO | Attending: Emergency Medicine | Admitting: Emergency Medicine

## 2016-07-27 ENCOUNTER — Encounter (HOSPITAL_COMMUNITY): Payer: Self-pay | Admitting: Emergency Medicine

## 2016-07-27 DIAGNOSIS — N509 Disorder of male genital organs, unspecified: Secondary | ICD-10-CM | POA: Diagnosis not present

## 2016-07-27 DIAGNOSIS — Z7901 Long term (current) use of anticoagulants: Secondary | ICD-10-CM | POA: Insufficient documentation

## 2016-07-27 DIAGNOSIS — R1909 Other intra-abdominal and pelvic swelling, mass and lump: Secondary | ICD-10-CM

## 2016-07-27 DIAGNOSIS — I252 Old myocardial infarction: Secondary | ICD-10-CM | POA: Diagnosis not present

## 2016-07-27 DIAGNOSIS — Z87891 Personal history of nicotine dependence: Secondary | ICD-10-CM | POA: Insufficient documentation

## 2016-07-27 DIAGNOSIS — G8918 Other acute postprocedural pain: Secondary | ICD-10-CM

## 2016-07-27 DIAGNOSIS — I2510 Atherosclerotic heart disease of native coronary artery without angina pectoris: Secondary | ICD-10-CM | POA: Insufficient documentation

## 2016-07-27 DIAGNOSIS — M7989 Other specified soft tissue disorders: Secondary | ICD-10-CM | POA: Diagnosis not present

## 2016-07-27 DIAGNOSIS — Z794 Long term (current) use of insulin: Secondary | ICD-10-CM | POA: Insufficient documentation

## 2016-07-27 DIAGNOSIS — N189 Chronic kidney disease, unspecified: Secondary | ICD-10-CM | POA: Insufficient documentation

## 2016-07-27 DIAGNOSIS — Z951 Presence of aortocoronary bypass graft: Secondary | ICD-10-CM | POA: Insufficient documentation

## 2016-07-27 DIAGNOSIS — E1140 Type 2 diabetes mellitus with diabetic neuropathy, unspecified: Secondary | ICD-10-CM | POA: Insufficient documentation

## 2016-07-27 DIAGNOSIS — M79609 Pain in unspecified limb: Secondary | ICD-10-CM

## 2016-07-27 DIAGNOSIS — N4889 Other specified disorders of penis: Secondary | ICD-10-CM | POA: Insufficient documentation

## 2016-07-27 DIAGNOSIS — I5022 Chronic systolic (congestive) heart failure: Secondary | ICD-10-CM | POA: Diagnosis not present

## 2016-07-27 DIAGNOSIS — Z9889 Other specified postprocedural states: Secondary | ICD-10-CM

## 2016-07-27 DIAGNOSIS — N5089 Other specified disorders of the male genital organs: Secondary | ICD-10-CM

## 2016-07-27 DIAGNOSIS — I130 Hypertensive heart and chronic kidney disease with heart failure and stage 1 through stage 4 chronic kidney disease, or unspecified chronic kidney disease: Secondary | ICD-10-CM | POA: Diagnosis not present

## 2016-07-27 DIAGNOSIS — Z8673 Personal history of transient ischemic attack (TIA), and cerebral infarction without residual deficits: Secondary | ICD-10-CM | POA: Insufficient documentation

## 2016-07-27 DIAGNOSIS — N5082 Scrotal pain: Secondary | ICD-10-CM

## 2016-07-27 DIAGNOSIS — N433 Hydrocele, unspecified: Secondary | ICD-10-CM | POA: Diagnosis not present

## 2016-07-27 DIAGNOSIS — R1032 Left lower quadrant pain: Secondary | ICD-10-CM | POA: Diagnosis not present

## 2016-07-27 DIAGNOSIS — N50819 Testicular pain, unspecified: Secondary | ICD-10-CM | POA: Diagnosis present

## 2016-07-27 DIAGNOSIS — R609 Edema, unspecified: Secondary | ICD-10-CM | POA: Diagnosis not present

## 2016-07-27 DIAGNOSIS — R102 Pelvic and perineal pain: Secondary | ICD-10-CM | POA: Diagnosis not present

## 2016-07-27 LAB — CBC WITH DIFFERENTIAL/PLATELET
BASOS ABS: 0 10*3/uL (ref 0.0–0.1)
Basophils Relative: 0 %
EOS ABS: 0.1 10*3/uL (ref 0.0–0.7)
EOS PCT: 2 %
HCT: 33.2 % — ABNORMAL LOW (ref 39.0–52.0)
Hemoglobin: 10.8 g/dL — ABNORMAL LOW (ref 13.0–17.0)
LYMPHS PCT: 27 %
Lymphs Abs: 1.6 10*3/uL (ref 0.7–4.0)
MCH: 28.7 pg (ref 26.0–34.0)
MCHC: 32.5 g/dL (ref 30.0–36.0)
MCV: 88.3 fL (ref 78.0–100.0)
Monocytes Absolute: 0.9 10*3/uL (ref 0.1–1.0)
Monocytes Relative: 16 %
Neutro Abs: 3.3 10*3/uL (ref 1.7–7.7)
Neutrophils Relative %: 55 %
PLATELETS: 153 10*3/uL (ref 150–400)
RBC: 3.76 MIL/uL — AB (ref 4.22–5.81)
RDW: 17.3 % — AB (ref 11.5–15.5)
WBC: 5.9 10*3/uL (ref 4.0–10.5)

## 2016-07-27 LAB — I-STAT CHEM 8, ED
BUN: 15 mg/dL (ref 6–20)
CALCIUM ION: 1.09 mmol/L — AB (ref 1.15–1.40)
Chloride: 106 mmol/L (ref 101–111)
Creatinine, Ser: 1.3 mg/dL — ABNORMAL HIGH (ref 0.61–1.24)
GLUCOSE: 103 mg/dL — AB (ref 65–99)
HCT: 31 % — ABNORMAL LOW (ref 39.0–52.0)
HEMOGLOBIN: 10.5 g/dL — AB (ref 13.0–17.0)
POTASSIUM: 3.9 mmol/L (ref 3.5–5.1)
SODIUM: 138 mmol/L (ref 135–145)
TCO2: 24 mmol/L (ref 0–100)

## 2016-07-27 MED ORDER — IOPAMIDOL (ISOVUE-300) INJECTION 61%
INTRAVENOUS | Status: AC
  Filled 2016-07-27: qty 150

## 2016-07-27 MED ORDER — CLINDAMYCIN PHOSPHATE 600 MG/50ML IV SOLN
600.0000 mg | Freq: Once | INTRAVENOUS | Status: AC
  Administered 2016-07-27: 600 mg via INTRAVENOUS
  Filled 2016-07-27: qty 50

## 2016-07-27 NOTE — ED Provider Notes (Signed)
Care assumed from Dr. Nicanor Alcon at shift change. Patient with history of peripheral vascular disease status post bilateral AKA with recent revascularization procedures to both stumps. He presents today with complaints of swelling in the stumps and scrotum. This began in the absence of any injury or trauma. He was initially evaluated by Dr. Nicanor Alcon and underwent vascular studies of both legs. This showed no DVT and no evidence for arterial occlusion. This was discussed with Dr. Randie Heinz from vascular surgery who does not feel as though this is a vascular issue.  He then underwent ultrasound of the scrotum which revealed no evidence for torsion and no other obvious abnormality with the exception of scrotal edema. Upon reexamining his scrotum, there is no warmth, crepitus, or findings that are concerning for Fournier's gangrene. He has no fever, no white count and is nontoxic appearing.  At this point, I do not have a good explanation for the swelling in his scrotum, however do feel as though I have ruled out life-threatening and emergent pathology. My plan is to increase his diuretic and give this situation a few days to hopefully resolve. He is to return as needed if his symptoms worsen or change.   Geoffery Lyons, MD 07/27/16 1318

## 2016-07-27 NOTE — ED Notes (Signed)
[facility] notified of need for transportation back to [facility] nursing facility

## 2016-07-27 NOTE — Consult Note (Signed)
Hospital Consult    Reason for Consult:  Scrotal and leg swelling Referring Physician:  ED MRN #:  [id number]  History of Present Illness: This is a 71 y.o. male recently underwent stenting of right common femoral artery with profunda angioplasty for tight stenosis in a patient with bilateral amputations. He initially complained of pain in his bilateral residual limbs yesterday and was noted to have significant edema of his bilateral limbs, penis, and scrotum. He has never had this before. Denies associated pain at this time and also has not had constitutional symptoms. States that he is able to urinate despite significant edema.   Past Medical History:  Diagnosis Date  . Acute respiratory failure with hypoxia and hypercapnia ([place]) 05/31/2016  . Atrial flutter ([place])   . Cataract   . Chronic kidney disease    on Lisinopril to protect kidneys d/t being on Metformin per pt  . Chronic systolic CHF (congestive heart failure) ([place])   . Coronary artery disease    a. s/p remote CABG 2001 at [facility] b. Cath 03/2010: for med rx.;  c.  Carlton Adam myoview (1/14):  Large Inferior apical MI, very small area of peri-infarct ischemia toward the inferior apical segment. EF 19%.  Med Rx was continued.    . Diabetes mellitus 1979   takes Metformni daily  and Lantus 50units bid  . DVT (deep venous thrombosis) ([place]) 2011   legs  . H/O hiatal hernia   . H/O medication noncompliance    Due to insurance issues  . History of blood clots    in legs--this was in 2011--has been off of Coumadin 53month;takes Xarelto daily  . Hyperlipidemia   . Hypotension   . Ischemic cardiomyopathy    a. EF 40% 2010. b. Echo (2/14):  mild LVH, EF 30-35%, diff HK, Gr 1 DD, MAC, mild MR, mod LAE, PASP 39  . Joint pain   . [place]ocardial infarction ([place]    total of 8 heart attacks;last one in 2011  . Paroxysmal atrial fibrillation ([facility])   . Peripheral neuropathy   . Peripheral vascular disease ([place]    a. s/p L BKA 2014.  .[name] Shortness of breath dyspnea   . Stroke ([facility]     Past Surgical History:  Procedure Laterality Date  . ABDOMINAL AORTAGRAM  04-30-12  . ABDOMINAL AORTAGRAM N/A 04/30/2012   Procedure: ABDOMINAL AMaxcine Ham  Surgeon: BConrad Brimfield MD;  [facility];  Service: Cardiovascular;  Laterality: N/A;  . ABDOMINAL AORTOGRAM W/LOWER EXTREMITY N/A 07/01/2016   Procedure: Abdominal Aortogram w/Lower Extremity;  Surgeon: BConrad [place] MD;  [facility];  Service: Cardiovascular;  Laterality: N/A;  . ADENOIDECTOMY    . AMPUTATION Left 06/03/2012   Procedure: AMPUTATION BELOW KNEE;  Surgeon: BConrad [place] MD;  [place] [facility] [place]  Service: Vascular;  Laterality: Left;  . AMPUTATION Right 04/14/2013   Procedure: AMPUTATION BELOW KNEE ;  Surgeon: BConrad [place] MD;  [place]  Service: Vascular;  Laterality: Right;  . APPLICATION OF WOUND VAC Right 04/20/2014   Procedure: APPLICATION OF WOUND VAC;  Surgeon: BConrad [place] MD;  [place]  Service: Vascular;  Laterality: Right;  . CARDIAC CATHETERIZATION  03/19/10  . CARDIAC CATHETERIZATION N/A 02/22/2016   Procedure: Left Heart Cath and Cors/Grafts Angiography;  Surgeon: JJettie Booze MD;  [facility];  Service: Cardiovascular;  Laterality: N/A;  . CARDIAC CATHETERIZATION N/A 02/22/2016   Procedure: Coronary Stent Intervention;  Surgeon: Jettie Booze, MD;  Location: [place] CV LAB;  Service: Cardiovascular;  Laterality: N/A;  . CARDIAC CATHETERIZATION    . COLONOSCOPY WITH PROPOFOL N/A 10/24/2015   Procedure: COLONOSCOPY WITH PROPOFOL;  Surgeon: Milus Banister, MD;  [facility];  Service: Endoscopy;  Laterality: N/A;  . CORONARY ANGIOPLASTY  2002   3 stents  . CORONARY ARTERY BYPASS GRAFT  03/2000   quadruple  . ENDARTERECTOMY FEMORAL Right 03/25/2013   Procedure: ENDARTERECTOMY FEMORAL ;  Surgeon: Conrad [place], MD;  Location: [place];  Service: Vascular;  Laterality: Right;  . EYE  SURGERY Right    Catarct  . HERNIA REPAIR     Hiatal Hernia  . I&D EXTREMITY Right 04/14/2013   Procedure: IRRIGATION AND DEBRIDEMENT OF RIGHT  GROIN & PLACEMENT OF VAC DRESSING;  Surgeon: Conrad Morrison, MD;  Location: [place];  Service: Vascular;  Laterality: Right;  . I&D EXTREMITY Right 04/20/2014   Procedure: DEBRIDEMENT OF RIGHT BELOW KNEE AMPUTATION STUMP;  Surgeon: Conrad [place], MD;  Location: [place];  Service: Vascular;  Laterality: Right;  . LOWER EXTREMITY ANGIOGRAM Bilateral 04/30/2012   Procedure: LOWER EXTREMITY ANGIOGRAM;  Surgeon: Conrad Braddock, MD;  [facility] CATH LAB;  Service: Cardiovascular;  Laterality: Bilateral;  bilat lower extrem angio  . LOWER EXTREMITY ANGIOGRAPHY N/A 07/25/2016   Procedure: Lower Extremity Angiography;  Surgeon: Conrad [place], MD;  Location: [place] CV LAB;  Service: Cardiovascular;  Laterality: N/A;  . PATCH ANGIOPLASTY Right 03/25/2013   Procedure: PATCH ANGIOPLASTY USING VASCU-GUARD PERIPHERAL VASCULAR PATCH;  Surgeon: Conrad [place], MD;  Location: [place];  Service: Vascular;  Laterality: Right;  . PERIPHERAL VASCULAR BALLOON ANGIOPLASTY  07/25/2016   Procedure: Peripheral Vascular Balloon Angioplasty;  Surgeon: Conrad [place], MD;  Location: [place] CV LAB;  Service: Cardiovascular;;  . PERIPHERAL VASCULAR INTERVENTION  07/25/2016   Procedure: Peripheral Vascular Intervention;  Surgeon: Conrad Paden, MD;  Location: [place] CV LAB;  Service: Cardiovascular;;  . revasculariztion  2011/2010   x 2   . TONSILLECTOMY    . WOUND DEBRIDEMENT Right 04/20/2014   s/p  rt bka  with application of wound vac    Allergies  Allergen Reactions  . Statins Other (See Comments)    Reaction:  Muscle pain   . Amlodipine Besylate Other (See Comments)    Reaction:  Muscle pain   . Cephalexin Hives    Prior to Admission medications   Medication Sig Start Date End Date Taking? Authorizing Provider  albuterol (PROVENTIL HFA;VENTOLIN HFA) 108 (90 Base) MCG/ACT  inhaler Inhale 2 puffs into the lungs every 6 (six) hours as needed for wheezing or shortness of breath. 03/23/16  Yes Debbrah Alar, NP  benzonatate (TESSALON) 100 MG capsule Take 100 mg by mouth 3 (three) times daily as needed for cough.   Yes Historical Provider, MD  carvedilol (COREG) 3.125 MG tablet Take 1 tablet (3.125 mg total) by mouth 2 (two) times daily with a meal. 06/10/16  Yes Robbie Lis, MD  clopidogrel (PLAVIX) 75 MG tablet Take 1 tablet (75 mg total) by mouth daily. 04/27/16  Yes Janith Lima, MD  docusate sodium (COLACE) 100 MG capsule Take 1 capsule (100 mg total) by mouth 2 (two) times daily. 06/10/16  Yes Robbie Lis, MD  furosemide (LASIX) 40 MG tablet Take 1 tablet (40 mg total) by mouth daily. Patient taking differently: Take 40 mg by mouth daily. May take an  additional 17m daily as needed for weight gain of 3lbs in 24 hours or 5lbs in a week 06/11/16  Yes ARobbie Lis MD  hydrALAZINE (APRESOLINE) 25 MG tablet Take 1.5 tablets (37.5 mg total) by mouth every 8 (eight) hours. 06/10/16  Yes ARobbie Lis MD  insulin aspart (NOVOLOG) 100 UNIT/ML injection Inject 0-15 Units into the skin 3 (three) times daily with meals. Patient taking differently: Inject 3-15 Units into the skin 3 (three) times daily with meals. Per sliding scale: 101-150 = 3 units, 151-200 = 4 units, 201-250 = 7 units, 251-300 = 9 units, 301-350 = 12 units, >350 = 15 units, call MD for BS > 400 or < 60 06/10/16  Yes ARobbie Lis MD  isosorbide mononitrate (IMDUR) 30 MG 24 hr tablet Take 1 tablet (30 mg total) by mouth daily. 06/11/16  Yes ARobbie Lis MD  levalbuterol ([place] 0.63 MG/3ML nebulizer solution Take 0.63 mg by nebulization every 6 (six) hours as needed for wheezing or shortness of breath.   Yes Historical Provider, MD  Multiple Vitamins-Minerals (DECUBI-VITE) CAPS Take 1 capsule by mouth daily.   Yes Historical Provider, MD  pantoprazole (PROTONIX) 40 MG tablet Take 1 tablet (40 mg total) by  mouth daily at 12 noon. 06/11/16  Yes ARobbie Lis MD  potassium chloride (K-DUR) 10 MEQ tablet Take 1 tablet (10 mEq total) by mouth daily. Patient taking differently: Take 10 mEq by mouth daily. May take an additional 141m daily as needed for weight gain of 3lbs in 24 hours or 5lbs in a week 06/10/16  Yes AlRobbie LisMD  rivaroxaban (XARELTO) 20 MG TABS tablet Take 1 tablet (20 mg total) by mouth daily with supper. 06/10/16  Yes AlRobbie LisMD  rosuvastatin (CRESTOR) 20 MG tablet Take 20 mg by mouth daily.   Yes Historical Provider, MD  vitamin C (ASCORBIC ACID) 500 MG tablet Take 500 mg by mouth 2 (two) times daily.   Yes Historical Provider, MD  zinc sulfate 220 (50 Zn) MG capsule Take 220 mg by mouth daily.   Yes Historical Provider, MD    Social History   Social History  . Marital status: Legally Separated    Spouse name: N/A  . Number of children: N/A  . Years of education: N/A   Occupational History  . Not on file.   Social History Main Topics  . Smoking status: Former Smoker    Types: Cigarettes  . Smokeless tobacco: Never Used     Comment: quit smoking [id number]  . Alcohol use No  . Drug use: No  . Sexual activity: Not Currently   Other Topics Concern  . Not on file   Social History Narrative  . No narrative on file    Family History  Problem Relation Age of Onset  . Heart disease Mother     before age 70 2 Diabetes Mother   . Heart attack Mother   . Stroke Mother   . Heart disease Father   . Colon cancer Neg Hx   . Stomach cancer Neg Hx   . Pancreatic cancer Neg Hx   . Esophageal cancer Neg Hx     ROS:  Cardiovascular: []  chest pain/pressure []  palpitations [x]  SOB lying flat []  DOE []  pain in legs while walking []  pain in legs at rest []  pain in legs at night [x]  non-healing ulcers []  hx of DVT [x]  swelling in legs  Pulmonary: []  productive cough []   asthma/wheezing []  home O2  Neurologic: []  weakness in []  arms []  legs []  numbness  in []  arms []  legs []  hx of CVA []  mini stroke [] difficulty speaking or slurred speech []  temporary loss of vision in one eye []  dizziness  Hematologic: []  hx of cancer []  bleeding problems []  problems with blood clotting easily  Endocrine:   []  diabetes []  thyroid disease  GI []  vomiting blood []  blood in stool  GU: []  CKD/renal failure []  HD--[]  M/W/F or []  T/T/S []  burning with urination [x]  scrotal/penile edema  Psychiatric: []  anxiety []  depression  Musculoskeletal: []  arthritis []  joint pain  Integumentary: []  rashes [x]  ulcers  Constitutional: []  fever []  chills   Physical Examination  Vitals:   07/27/16 1100 07/27/16 1227  BP: 105/71 98/82  Pulse: (!) 109 (!) 111  Resp:  17  Temp:     There is no height or weight on file to calculate BMI.  General:  WDWN in NAD HENT: WNL, normocephalic Pulmonary: labored breathing at rest Cardiac: rrr Abdomen:  soft, NT/ND, no masses but protuberant Extremities:bilateral bka, has ulcers on right knee and left stump site that are clean with fibrinous exudate Musculoskeletal: no muscle wasting or atrophy  Neurologic: A&O X 3;  ; SENSATION: normal; MOTOR FUNCTION:  moving all extremities equally. Speech is fluent/normal Psychiatric:appropriate mood and affect  CBC    Component Value Date/Time   WBC 5.9 07/27/2016 0633   RBC 3.76 (L) 07/27/2016 0633   HGB 10.5 (L) 07/27/2016 0644   HCT 31.0 (L) 07/27/2016 0644   PLT 153 07/27/2016 0633   MCV 88.3 07/27/2016 0633   MCH 28.7 07/27/2016 0633   MCHC 32.5 07/27/2016 0633   RDW 17.3 (H) 07/27/2016 0633   LYMPHSABS 1.6 07/27/2016 0633   MONOABS 0.9 07/27/2016 0633   EOSABS 0.1 07/27/2016 0633   BASOSABS 0.0 07/27/2016 0633    BMET    Component Value Date/Time   NA 138 07/27/2016 0644   K 3.9 07/27/2016 0644   CL 106 07/27/2016 0644   CO2 20 (L) 07/02/2016 0509   GLUCOSE 103 (H) 07/27/2016 0644   BUN 15 07/27/2016 0644   CREATININE 1.30 (H) 07/27/2016  0644   CALCIUM 8.9 07/02/2016 0509   GFRNONAA 52 (L) 07/02/2016 0509   GFRAA >60 07/02/2016 0509    COAGS: Lab Results  Component Value Date   INR 1.25 05/30/2016   INR 0.97 02/22/2016   INR 1.01 05/22/2015     Non-Invasive Vascular Imaging:    Summary:  - No evidence of deep vein thrombosis involving the visualized   veins of the right lower extremity. - No evidence of deep vein thrombosis involving the visualized   veins of the left lower extremity. - Moderate amount of free fluid seen in right lower quadrant, no   fluid noted in left lower quadrant. Extremely difficult exam due   to limitations of ultrasound. Suboptimal penetration due to   patient body habitus, arterial shadowing and scaring. Best images   possible were obtained, no evidence of DVT in the visualized   veins in the groin area, femoral and popliteal veins were not   adequately insonated bilaterally. Bilateral CFA&'s appear to be   patent above the saphenofemoral junction, distal arterial   evaluation is supoptimal.  ASSESSMENT/PLAN: This is a 71 y.o. male s/p right common femoral stenting with profunda pta. He now is in ED with significant edema of his scrotum, penis, and residual limbs that is non painful. This  does not appear to be systemic from allergy or medication reaction given lack of upper extremity or facial swelling. DVT study is negative with flow in veins in both extremities although extremely limited. Patient had refused ct scan with contrast earlier because he did not want another iv. No vascular source of problem without large dvt and would recommend elevation of his dependent areas and compression where possible.   Antwione Picotte C. Donzetta Matters, MD [facility] Office: [phone] Pager: [phone]

## 2016-07-27 NOTE — ED Notes (Signed)
Patient refused to have a 20 PiV put in for CT angiogram. MD notified.

## 2016-07-27 NOTE — ED Triage Notes (Signed)
Patient is complaining of swollen scrotum due to stents placed yesterday. Patient will not take his pain medications.

## 2016-07-27 NOTE — ED Notes (Signed)
Patient has pulses in both groins.

## 2016-07-27 NOTE — Discharge Instructions (Signed)
Increase your Lasix to 120 mg twice daily.  Follow-up with your primary Dr. in the next 3-4 days for a recheck.  Return to the emergency department in the meantime if you develop high fever, increased pain, inability to urinate, or other new and concerning symptoms.

## 2016-07-27 NOTE — ED Provider Notes (Signed)
[place] DEPT Provider Note   CSN: [id number] Arrival date & time: 07/27/16  0345     History   Chief Complaint Chief Complaint  Patient presents with  . Groin Swelling    HPI Leroy [name] is a 71 y.o. male.  The history is provided by the patient.  Testicle Pain  This is a new problem. The current episode started yesterday. The problem occurs constantly. The problem has not changed since onset.Pertinent negatives include no chest pain, no abdominal pain, no headaches and no shortness of breath. Nothing aggravates the symptoms. Nothing relieves the symptoms. He has tried nothing for the symptoms. The treatment provided no relief.  Is having testicle and penile pain and swelling following stent placement in his femoral done by Dr. Bridgett Larsson.  No SOB.  No CP.   No f/c/r.   Past Medical History:  Diagnosis Date  . Acute respiratory failure with hypoxia and hypercapnia ([place]) 05/31/2016  . Atrial flutter ([place])   . Cataract   . Chronic kidney disease    on Lisinopril to protect kidneys d/t being on Metformin per pt  . Chronic systolic CHF (congestive heart failure) ([place])   . Coronary artery disease    a. s/p remote CABG 2001 at [facility] b. Cath 03/2010: for med rx.;  c.  Carlton Adam myoview (1/14):  Large Inferior apical MI, very small area of peri-infarct ischemia toward the inferior apical segment. EF 19%.  Med Rx was continued.    . Diabetes mellitus 1979   takes Metformni daily  and Lantus 50units bid  . DVT (deep venous thrombosis) ([place]) 2011   legs  . H/O hiatal hernia   . H/O medication noncompliance    Due to insurance issues  . History of blood clots    in legs--this was in 2011--has been off of Coumadin 43month;takes Xarelto daily  . Hyperlipidemia   . Hypotension   . Ischemic cardiomyopathy    a. EF 40% 2010. b. Echo (2/14):  mild LVH, EF 30-35%, diff HK, Gr 1 DD, MAC, mild MR, mod LAE, PASP 39  . Joint pain   . Myocardial infarction ([facility]    total of 8 heart  attacks;last one in 2011  . Paroxysmal atrial fibrillation (H[facility])   . Peripheral neuropathy   . Peripheral vascular disease ([facility]    a. s/p L BKA 2014.  .Marland KitchenShortness of breath dyspnea   . Stroke ([facility]     Patient Active Problem List   Diagnosis Date Noted  . Hypokalemia   . Ischemic cardiomyopathy   . Polypharmacy 06/01/2016  . Acute respiratory failure with hypoxia and hypercapnia ([facility] 05/31/2016  . Altered mental status   . Chronic renal disease, stage 2, mildly decreased glomerular filtration rate (GFR) between 60-89 mL/min/1.73 square meter 04/30/2016  . Chronic bronchitis ([facility] 04/30/2016  . Acute on chronic combined systolic and diastolic congestive heart failure ([facility]   . Noncompliance 02/23/2016  . Shortness of breath   . NSTEMI (non-ST elevated myocardial infarction) ([facility] 02/22/2016  . Essential hypertension 02/22/2016  . Ischemic colitis ([facility] 10/25/2015  . Hemispheric carotid artery syndrome   . Coronary artery disease involving coronary bypass graft of native heart without angina pectoris   . PVD (peripheral vascular disease) ([facility]   . Stroke (cerebrum) ([facility] 05/22/2015  . Diabetes mellitus type 2 with peripheral artery disease ([facility] 05/22/2015  . Stroke due to embolism ([facility]   . Microalbuminuria 09/29/2014  . 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  Phantom limb pain ([place]) 08/16/2013  . Pain in limb- Right stump 07/06/2013  . Atherosclerosis of native arteries of the extremities with ulceration ([place]) 06/25/2013  . S/P bilateral BKA (below knee amputation) ([place]) 04/21/2013  . Chronic systolic heart failure ([place]) 04/15/2013  . Femoral artery stenosis ([place]) 03/25/2013  . S/P BKA (below knee amputation) bilateral ([place]) 09/10/2012  . SVT (supraventricular tachycardia) ([place]) 09/02/2012  . Hyperlipidemia with target LDL less than 70 01/02/2012  . Paroxysmal atrial fibrillation ([place]) 04/13/2010    Past Surgical History:  Procedure Laterality  Date  . ABDOMINAL AORTAGRAM  04-30-12  . ABDOMINAL AORTAGRAM N/A 04/30/2012   Procedure: ABDOMINAL Maxcine Ham;  Surgeon: Conrad [place], MD;  Location: [facility] CATH LAB;  Service: Cardiovascular;  Laterality: N/A;  . ABDOMINAL AORTOGRAM W/LOWER EXTREMITY N/A 07/01/2016   Procedure: Abdominal Aortogram w/Lower Extremity;  Surgeon: Conrad [place], MD;  Location: [place] CV LAB;  Service: Cardiovascular;  Laterality: N/A;  . ADENOIDECTOMY    . AMPUTATION Left 06/03/2012   Procedure: AMPUTATION BELOW KNEE;  Surgeon: Conrad [place], MD;  Location: [facility];  Service: Vascular;  Laterality: Left;  . AMPUTATION Right 04/14/2013   Procedure: AMPUTATION BELOW KNEE ;  Surgeon: Conrad [place], MD;  Location: [facility];  Service: Vascular;  Laterality: Right;  . APPLICATION OF WOUND VAC Right 04/20/2014   Procedure: APPLICATION OF WOUND VAC;  Surgeon: Conrad Jette, MD;  Location: [place];  Service: Vascular;  Laterality: Right;  . CARDIAC CATHETERIZATION  03/19/10  . CARDIAC CATHETERIZATION N/A 02/22/2016   Procedure: Left Heart Cath and Cors/Grafts Angiography;  Surgeon: Jettie Booze, MD;  Location: [place] CV LAB;  Service: Cardiovascular;  Laterality: N/A;  . CARDIAC CATHETERIZATION N/A 02/22/2016   Procedure: Coronary Stent Intervention;  Surgeon: Jettie Booze, MD;  Location: [place] CV LAB;  Service: Cardiovascular;  Laterality: N/A;  . CARDIAC CATHETERIZATION    . COLONOSCOPY WITH PROPOFOL N/A 10/24/2015   Procedure: COLONOSCOPY WITH PROPOFOL;  Surgeon: Milus Banister, MD;  Location: [facility] ENDOS[facility];  Service: Endoscopy;  Laterality: N/A;  . CORONARY ANGIOPLASTY  2002   3 stents  . CORONARY ARTERY BYPASS GRAFT  03/2000   quadruple  . ENDARTERECTOMY FEMORAL Right 03/25/2013   Procedure: ENDARTERECTOMY FEMORAL ;  Surgeon: Conrad [place], MD;  Location: [facility];  Service: Vascular;  Laterality: Right;  . EYE SURGERY Right    Catarct  . HERNIA REPAIR     Hiatal Hernia  . I&D EXTREMITY Right 04/14/2013    Procedure: IRRIGATION AND DEBRIDEMENT OF RIGHT  GROIN & PLACEMENT OF VAC DRESSING;  Surgeon: Conrad [place], MD;  Location: [facility];  Service: Vascular;  Laterality: Right;  . I&D EXTREMITY Right 04/20/2014   Procedure: DEBRIDEMENT OF RIGHT BELOW KNEE AMPUTATION STUMP;  Surgeon: Conrad Valdese, MD;  Location: [facility];  Service: Vascular;  Laterality: Right;  . LOWER EXTREMITY ANGIOGRAM Bilateral 04/30/2012   Procedure: LOWER EXTREMITY ANGIOGRAM;  Surgeon: Conrad Bufalo, MD;  Location[facility] CATH LAB;  Service: Cardiovascular;  Laterality: Bilateral;  bilat lower extrem angio  . LOWER EXTREMITY ANGIOGRAPHY N/A 07/25/2016   Procedure: Lower Extremity Angiography;  Surgeon: Conrad [place], MD;  Location: [place] CV LAB;  Service: Cardiovascular;  Laterality: N/A;  . PATCH ANGIOPLASTY Right 03/25/2013   Procedure: PATCH ANGIOPLASTY USING VASCU-GUARD PERIPHERAL VASCULAR PATCH;  Surgeon: Conrad , MD;  Location: [facility];  Service: Vascular;  Laterality: Right;  . PERIPHERAL VASCULAR BALLOON ANGIOPLASTY  07/25/2016   Procedure: Peripheral Vascular Balloon Angioplasty;  Surgeon: Conrad Robinson, MD;  Location: [facility] CV LAB;  Service: Cardiovascular;;  . PERIPHERAL VASCULAR INTERVENTION  07/25/2016   Procedure: Peripheral Vascular Intervention;  Surgeon: Conrad [place], MD;  Location: [facility] CV LAB;  Service: Cardiovascular;;  . revasculariztion  2011/2010   x 2   . TONSILLECTOMY    . WOUND DEBRIDEMENT Right 04/20/2014   s/p  rt bka  with application of wound vac       Home Medications    Prior to Admission medications   Medication Sig Start Date End Date Taking? Authorizing Provider  albuterol (PROVENTIL HFA;VENTOLIN HFA) 108 (90 Base) MCG/ACT inhaler Inhale 2 puffs into the lungs every 6 (six) hours as needed for wheezing or shortness of breath. 03/23/16   Debbrah Alar, NP  benzonatate (TESSALON) 100 MG capsule Take 100 mg by mouth 3 (three) times daily as needed for cough.    Historical  Provider, MD  carvedilol (COREG) 3.125 MG tablet Take 1 tablet (3.125 mg total) by mouth 2 (two) times daily with a meal. 06/10/16   Robbie Lis, MD  clopidogrel (PLAVIX) 75 MG tablet Take 1 tablet (75 mg total) by mouth daily. 04/27/16   Janith Lima, MD  docusate sodium (COLACE) 100 MG capsule Take 1 capsule (100 mg total) by mouth 2 (two) times daily. 06/10/16   Robbie Lis, MD  furosemide (LASIX) 40 MG tablet Take 1 tablet (40 mg total) by mouth daily. Patient taking differently: Take 40 mg by mouth daily. May take an additional 25m daily as needed for weight gain of 3lbs in 24 hours or 5lbs in a week 06/11/16   ARobbie Lis MD  hydrALAZINE (APRESOLINE) 25 MG tablet Take 1.5 tablets (37.5 mg total) by mouth every 8 (eight) hours. 06/10/16   ARobbie Lis MD  insulin aspart (NOVOLOG) 100 UNIT/ML injection Inject 0-15 Units into the skin 3 (three) times daily with meals. Patient taking differently: Inject 3-15 Units into the skin 3 (three) times daily with meals. Per sliding scale: 101-150 = 3 units, 151-200 = 4 units, 201-250 = 7 units, 251-300 = 9 units, 301-350 = 12 units, >350 = 15 units, call MD for BS > 400 or < 60 06/10/16   ARobbie Lis MD  isosorbide mononitrate (IMDUR) 30 MG 24 hr tablet Take 1 tablet (30 mg total) by mouth daily. 06/11/16   ARobbie Lis MD  levalbuterol ([place] 0.63 MG/3ML nebulizer solution Take 0.63 mg by nebulization every 6 (six) hours as needed for wheezing or shortness of breath.    Historical Provider, MD  Multiple Vitamins-Minerals (DECUBI-VITE) CAPS Take 1 capsule by mouth daily.    Historical Provider, MD  pantoprazole (PROTONIX) 40 MG tablet Take 1 tablet (40 mg total) by mouth daily at 12 noon. 06/11/16   ARobbie Lis MD  potassium chloride (K-DUR) 10 MEQ tablet Take 1 tablet (10 mEq total) by mouth daily. Patient taking differently: Take 10 mEq by mouth daily. May take an additional 165m daily as needed for weight gain of 3lbs in 24 hours or 5lbs in a week  06/10/16   AlRobbie LisMD  rivaroxaban (XARELTO) 20 MG TABS tablet Take 1 tablet (20 mg total) by mouth daily with supper. 06/10/16   AlRobbie LisMD  rosuvastatin (CRESTOR) 20 MG tablet Take 20 mg by mouth daily.    Historical Provider, MD  vitamin C (ASCORBIC ACID) 500  MG tablet Take 500 mg by mouth 2 (two) times daily.    Historical Provider, MD  zinc sulfate 220 (50 Zn) MG capsule Take 220 mg by mouth daily.    Historical Provider, MD    Family History Family History  Problem Relation Age of Onset  . Heart disease Mother     before age 21  . Diabetes Mother   . Heart attack Mother   . Stroke Mother   . Heart disease Father   . Colon cancer Neg Hx   . Stomach cancer Neg Hx   . Pancreatic cancer Neg Hx   . Esophageal cancer Neg Hx     Social History Social History  Substance Use Topics  . Smoking status: Former Smoker    Types: Cigarettes  . Smokeless tobacco: Never Used     Comment: quit smoking 63yr ago  . Alcohol use No     Allergies   Statins; Amlodipine besylate; and Cephalexin   Review of Systems Review of Systems  Constitutional: Negative for fever.  Respiratory: Negative for shortness of breath.   Cardiovascular: Negative for chest pain.  Gastrointestinal: Negative for abdominal pain.  Genitourinary: Positive for penile swelling, scrotal swelling and testicular pain. Negative for dysuria.  Musculoskeletal: Positive for arthralgias.  Neurological: Negative for headaches.  All other systems reviewed and are negative.    Physical Exam Updated Vital Signs BP (!) 131/101   Pulse (!) 115   Temp 98.2 F (36.8 C) (Oral)   Resp 19   SpO2 97%   Physical Exam  Constitutional: He is oriented to person, place, and time. He appears well-developed and well-nourished. No distress.  HENT:  Head: Normocephalic and atraumatic.  Nose: Nose normal.  Mouth/Throat: No oropharyngeal exudate.  Eyes: Conjunctivae and EOM are normal. Pupils are equal, round, and  reactive to light.  Neck: Normal range of motion. Neck supple.  Cardiovascular: Normal rate, regular rhythm, normal heart sounds and intact distal pulses.   Pulmonary/Chest: Effort normal and breath sounds normal. He has no wheezes. He has no rales.  Abdominal: Soft. Bowel sounds are normal. He exhibits no mass. There is no tenderness. There is no guarding.  Musculoskeletal: Normal range of motion.  Neurological: He is alert and oriented to person, place, and time.  Skin: Skin is warm and dry. Capillary refill takes less than 2 seconds.  Psychiatric: He has a normal mood and affect.     ED Treatments / Results   Vitals:   07/27/16 0350 07/27/16 0724  BP: (!) 137/108 (!) 131/101  Pulse: (!) 106 (!) 115  Resp: 19   Temp: 98.2 F (36.8 C)     Labs (all labs ordered are listed, but only abnormal results are displayed) Results for orders placed or performed during the hospital encounter of 07/27/16  I-Stat Chem 8, ED  Result Value Ref Range   Sodium 138 135 - 145 mmol/L   Potassium 3.9 3.5 - 5.1 mmol/L   Chloride 106 101 - 111 mmol/L   BUN 15 6 - 20 mg/dL   Creatinine, Ser 1.30 (H) 0.61 - 1.24 mg/dL   Glucose, Bld 103 (H) 65 - 99 mg/dL   Calcium, Ion 1.09 (L) 1.15 - 1.40 mmol/L   TCO2 24 0 - 100 mmol/L   Hemoglobin 10.5 (L) 13.0 - 17.0 g/dL   HCT 31.0 (L) 39.0 - 52.0 %   Dg Chest 2 View  Result Date: 07/27/2016 CLINICAL DATA:  Patient had stents placed yesterday and now complains  of swollen scrotum. History of hypertension, coronary artery disease, diabetes, myocardial infarct, ex-smoker. EXAM: CHEST  2 VIEW COMPARISON:  07/01/2016 FINDINGS: Postoperative changes in the mediastinum. Mild cardiac enlargement with normal pulmonary vascularity. No focal consolidation or airspace disease in the lungs. No pneumothorax. Mild blunting of costophrenic angles with fluid in the fissures may indicate small effusions. IMPRESSION: Possible small bilateral pleural effusions. No evidence of  active pulmonary disease. Electronically Signed   By: Lucienne Capers M.D.   On: 07/27/2016 06:00   Dg Chest Port 1 View  Result Date: 07/01/2016 CLINICAL DATA:  Postop abdominal aortogram with BILATERAL lower extremity imaging, audible wheezing, shortness of breath, moderate distress, history CHF, MI, atrial fibrillation, diabetes mellitus, atrial flutter hypertension EXAM: PORTABLE CHEST 1 VIEW COMPARISON:  Portable exam 1812 hours compared to 06/03/2016 FINDINGS: Enlargement of cardiac silhouette post CABG. Mediastinal contours and pulmonary vascularity normal. Minimal bibasilar atelectasis. Lungs otherwise clear. No infiltrate, pleural effusion or pneumothorax. IMPRESSION: Minimal enlargement of cardiac silhouette and bibasilar atelectasis. Electronically Signed   By: Lavonia Dana M.D.   On: 07/01/2016 18:26      Procedures Procedures (including critical care time)  Medications Ordered in ED Medications  iopamidol (ISOVUE-300) 61 % injection (not administered)    Seen by Dr. Donzetta Matters obtain B duplex and he will read it.      Final Clinical Impressions(s) / ED Diagnoses   Final diagnoses:  Post-operative state  Scrotal swelling Signed out to Dr. Stark Jock pending duplex of BLE.      Veatrice Kells, MD 07/27/16 [phone]

## 2016-07-27 NOTE — Progress Notes (Signed)
*  PRELIMINARY RESULTS* Vascular Ultrasound Bilateral lower extremity venous duplex has been completed.  Preliminary findings: Moderate amount of free fluid seen in right lower quadrant, no fluid noted in left lower quadrant.  Extremely difficult exam due to limitations of ultrasound.  Suboptimal penetration due to patient body habitus, arterial shadowing and scaring.  Best images possible were obtained, no evidence of DVT in the visualized veins in the groin area bilaterally, femoral and popliteal veins were not adequately insonated.  Bilateral CFA's appear to be patent above the saphenofemoral junction, distal arterial evaluation is supoptimal.  Preliminary results given to nurse @ 8:50  Chauncey Fischer 07/27/2016, 8:52 AM

## 2016-07-27 NOTE — ED Notes (Signed)
Bed: WA17 Expected date:  Expected time:  Means of arrival:  Comments: EMS 71 yo male /recent stent/swollen testicles

## 2016-07-27 NOTE — ED Notes (Signed)
Attempted x 2 to call report to [facility] , but no answer. Will give PTAR  Discharge instructions and relay that nursing home not contacted.

## 2016-07-29 DIAGNOSIS — N183 Chronic kidney disease, stage 3 (moderate): Secondary | ICD-10-CM | POA: Diagnosis not present

## 2016-07-29 DIAGNOSIS — N5089 Other specified disorders of the male genital organs: Secondary | ICD-10-CM | POA: Diagnosis not present

## 2016-07-29 DIAGNOSIS — I739 Peripheral vascular disease, unspecified: Secondary | ICD-10-CM | POA: Diagnosis not present

## 2016-07-29 DIAGNOSIS — I5023 Acute on chronic systolic (congestive) heart failure: Secondary | ICD-10-CM | POA: Diagnosis not present

## 2016-08-06 DIAGNOSIS — 419620001 Death: Secondary | SNOMED CT | POA: Diagnosis not present

## 2016-08-06 DEATH — deceased

## 2016-08-28 ENCOUNTER — Encounter: Payer: Self-pay | Admitting: Vascular Surgery

## 2016-09-03 NOTE — Progress Notes (Deleted)
Postoperative Visit   History of Present Illness   Leroy [name] is a 71 y.o. male who presents for postoperative follow-up from procedure on R proximal CFA PTA+S, DCB PTA R PFA, (07/25/16).  The patient's wounds are *** healed.  The patient notes *** resolution of lower extremity symptoms.  The patient is *** able to complete their activities of daily living.  The patient's current symptoms are: ***.  Past Medical History, Past Surgical History, Social History, Family History, Medications, Allergies, and Review of Systems are unchanged from previous evaluation on 07/25/16.  Current Outpatient Prescriptions  Medication Sig Dispense Refill  . albuterol (PROVENTIL HFA;VENTOLIN HFA) 108 (90 Base) MCG/ACT inhaler Inhale 2 puffs into the lungs every 6 (six) hours as needed for wheezing or shortness of breath. 1 Inhaler 0  . benzonatate (TESSALON) 100 MG capsule Take 100 mg by mouth 3 (three) times daily as needed for cough.    . carvedilol (COREG) 3.125 MG tablet Take 1 tablet (3.125 mg total) by mouth 2 (two) times daily with a meal. 60 tablet 0  . clopidogrel (PLAVIX) 75 MG tablet Take 1 tablet (75 mg total) by mouth daily. 90 tablet 1  . docusate sodium (COLACE) 100 MG capsule Take 1 capsule (100 mg total) by mouth 2 (two) times daily. 10 capsule 0  . furosemide (LASIX) 40 MG tablet Take 1 tablet (40 mg total) by mouth daily. (Patient taking differently: Take 40 mg by mouth daily. May take an additional 40mg  daily as needed for weight gain of 3lbs in 24 hours or 5lbs in a week) 30 tablet 0  . hydrALAZINE (APRESOLINE) 25 MG tablet Take 1.5 tablets (37.5 mg total) by mouth every 8 (eight) hours. 90 tablet 0  . insulin aspart (NOVOLOG) 100 UNIT/ML injection Inject 0-15 Units into the skin 3 (three) times daily with meals. (Patient taking differently: Inject 3-15 Units into the skin 3 (three) times daily with meals. Per sliding scale: 101-150 = 3 units, 151-200 = 4 units, 201-250 = 7 units, 251-300  = 9 units, 301-350 = 12 units, >350 = 15 units, call MD for BS > 400 or < 60) 10 mL 11  . isosorbide mononitrate (IMDUR) 30 MG 24 hr tablet Take 1 tablet (30 mg total) by mouth daily. 30 tablet 0  . levalbuterol (XOPENEX) 0.63 MG/3ML nebulizer solution Take 0.63 mg by nebulization every 6 (six) hours as needed for wheezing or shortness of breath.    . Multiple Vitamins-Minerals (DECUBI-VITE) CAPS Take 1 capsule by mouth daily.    . pantoprazole (PROTONIX) 40 MG tablet Take 1 tablet (40 mg total) by mouth daily at 12 noon. 30 tablet 0  . potassium chloride (K-DUR) 10 MEQ tablet Take 1 tablet (10 mEq total) by mouth daily. (Patient taking differently: Take 10 mEq by mouth daily. May take an additional daily as needed for weight gain of 3lbs in 24 hours or 5lbs in a week) 30 tablet 0  . rivaroxaban (XARELTO) 20 MG TABS tablet Take 1 tablet (20 mg total) by mouth daily with supper. 30 tablet 0  . rosuvastatin (CRESTOR) 20 MG tablet Take 20 mg by mouth daily.    . vitamin C (ASCORBIC ACID) 500 MG tablet Take 500 mg by mouth 2 (two) times daily.    [name] zinc sulfate 220 (50 Zn) MG capsule Take 220 mg by mouth daily.     No current facility-administered medications for this visit.     ROS: ***,***  For VQI Use Only   PRE-ADM LIVING: {VQI Pre-admission Living:20973}  AMB STATUS: {VQI Ambulatory Status:20974}   Physical Examination   There were no vitals filed for this visit.  There is no height or weight on file to calculate BMI.  General {LOC:19197::"Somulent","Alert"}, {Orientation:19197::"Confused","O x 3"}, {Weight:19197::"Obese","Cachectic","WD"}, {General state of health:19197::"Ill appearing","NAD"}  Pulmonary {Chest wall:19197::"Asx chest movement","Sym exp"}, {Air movt:19197::"Decreased *** air movt","good B air movt"}, {BS:19197::"rales on ***","rhonchi on ***","wheezing on ***","CTA B"}  Cardiac {Rhythm:19197::"Irregularly, irregular rate and rhythm","RRR, Nl S1, S2"},  {Murmur:19197::"Murmur present: ***","no Murmurs"}, {Rubs:19197::"Rub present: ***","No rubs"}, {Gallop:19197::"Gallop present: ***","No S3,S4"}  Vascular Vessel Right Left  Radial {Palpable:19197::"Not palpable","Faintly palpable","Palpable"} {Palpable:19197::"Not palpable","Faintly palpable","Palpable"}  Brachial {Palpable:19197::"Not palpable","Faintly palpable","Palpable"} {Palpable:19197::"Not palpable","Faintly palpable","Palpable"}  Carotid Palpable, {Bruit:19197::"Bruit present","No Bruit"} Palpable, {Bruit:19197::"Bruit present","No Bruit"}  Aorta Not palpable N/A  Femoral {Palpable:19197::"Not palpable","Faintly palpable","Palpable"} {Palpable:19197::"Not palpable","Faintly palpable","Palpable"}  Popliteal {Palpable:19197::"Prominently palpable","Not palpable"} {Palpable:19197::"Prominently palpable","Not palpable"}  PT {Palpable:19197::"Not palpable","Faintly palpable","Palpable"} {Palpable:19197::"Not palpable","Faintly palpable","Palpable"}  DP {Palpable:19197::"Not palpable","Faintly palpable","Palpable"} {Palpable:19197::"Not palpable","Faintly palpable","Palpable"}    Gastrointestinal soft, {Distension:19197::"distended","non-distended"}, {TTP:19197::"TTP in *** quadrant","appropriate tenderness to palpation","non-tender to palpation"}, {Guarding:19197::"Guarding and rebound in *** quadrant","No guarding or rebound"}, {HSM:19197::"HSM present","no HSM"}, {Masses:19197::"Mass present: ***","no masses"}, {Flank:19197::"CVAT on ***","Flank bruit present on ***","no CVAT B"}, {AAA:19197::"Palpable prominent aortic pulse","No palpable prominent aortic pulse"}, {Surgical incision:19197::"Surg incisions: ***","Surgical incisions well healed"," "}  Musculoskeletal M/S 5/5 throughout {MS:19197::"except ***"," "}, Extremities without ischemic changes {MS:19197::"except ***"," "}, {Edema:19197::"Edema present: ***","No edema present"},  Neurologic  Pain and light touch intact in  extremities{CN:19197::" except for decreased sensation in ***"," "}, Motor exam as listed above    Medical Decision Making   Leroy [name] is a 71 y.o. male who presents s/p R CFA PTA+S, DCB R PFA, s/p L BKA, R iliofem EA w/ BPA, R BKA  Based on his angiographic findings, this patient needs: ***. I discussed in depth with the patient the nature of atherosclerosis, and emphasized the importance of maximal medical management including strict control of blood pressure, blood glucose, and lipid levels, obtaining regular exercise, and cessation of smoking.  The patient is aware that without maximal medical management the underlying atherosclerotic disease process will progress, limiting the benefit of any interventions. The patient is currently on a statin: Crestor.  The patient is currently on an anti-platelet: Plavix.  Thank you for allowing [place] to participate in this patient's care.   [name], MD, FACS [facility] Office: [phone] Pager: [phone]

## 2016-09-06 ENCOUNTER — Encounter: Payer: Medicare HMO | Admitting: Vascular Surgery

## 2016-09-23 NOTE — Progress Notes (Deleted)
Cardiology Office Note   Date:  09/23/2016   ID:  Owenn, Rothermel 1945/12/13, MRN [id number]  PCP:  Janith Lima, MD  Cardiologist:   Minus Breeding, MD  Referring:  ***  No chief complaint on file.     History of Present Illness: Leroy [name] is a 71 y.o. male who presents for follow up of atrial flutter anticoagualted with Xarelto, and ischemic cardiomyopathy with EF 10-15% per echo in 02/2016, CAD s/p CABG in 2001 in [place], and NSTEMI 02/2016.  He has had multiple admissions for treatment of AMS and dyspnea and respiratory failure.  His "dry" weight seems to be about 234 lbs.  He has been seen by the Advanced Heart Failure Service but is not thought to be a candidate for advanced therapies.  He was discharged to a nursing home.  ***     Past Medical History:  Diagnosis Date  . Acute respiratory failure with hypoxia and hypercapnia ([place]) 05/31/2016  . Atrial flutter ([place])   . Cataract   . Chronic kidney disease    on Lisinopril to protect kidneys d/t being on Metformin per pt  . Chronic systolic CHF (congestive heart failure) ([place])   . Coronary artery disease    a. s/p remote CABG 2001 at [facility] b. Cath 03/2010: for med rx.;  c.  Carlton Adam myoview (1/14):  Large Inferior apical MI, very small area of peri-infarct ischemia toward the inferior apical segment. EF 19%.  Med Rx was continued.    . Diabetes mellitus 1979   takes Metformni daily  and Lantus 50units bid  . DVT (deep venous thrombosis) ([place]) 2011   legs  . H/O hiatal hernia   . H/O medication noncompliance    Due to insurance issues  . History of blood clots    in legs--this was in 2011--has been off of Coumadin 42month;takes Xarelto daily  . Hyperlipidemia   . Hypotension   . Ischemic cardiomyopathy    a. EF 40% 2010. b. Echo (2/14):  mild LVH, EF 30-35%, diff HK, Gr 1 DD, MAC, mild MR, mod LAE, PASP 39  . Joint pain   . [place]ocardial [place]    total of 8 heart attacks;last one in 2011  .  Paroxysmal atrial fibrillation ([facility])   . Peripheral neuropathy   . [place]ipheral vascular disease ([place]    a. s/p L BKA 2014.  .Marland KitchenShortness of breath dyspnea   . Stroke ([facility]     Past Surgical History:  Procedure Laterality Date  . ABDOMINAL AORTAGRAM  04-30-12  . ABDOMINAL AORTAGRAM N/A 04/30/2012   Procedure: ABDOMINAL AMaxcine Ham  Surgeon: BConrad Boyceville MD;  [facility];  Service: Cardiovascular;  Laterality: N/A;  . ABDOMINAL AORTOGRAM W/LOWER EXTREMITY N/A 07/01/2016   Procedure: Abdominal Aortogram w/Lower Extremity;  Surgeon: BConrad [place] MD;  [facility];  Service: Cardiovascular;  Laterality: N/A;  . ADENOIDECTOMY    . AMPUTATION Left 06/03/2012   Procedure: AMPUTATION BELOW KNEE;  Surgeon: BConrad Dickey MD;  [place] [facility]  Service: Vascular;  Laterality: Left;  . AMPUTATION Right 04/14/2013   Procedure: AMPUTATION BELOW KNEE ;  Surgeon: BConrad Kermit MD;  [place] [facility]  Service: Vascular;  Laterality: Right;  . APPLICATION OF WOUND VAC Right 04/20/2014   Procedure: APPLICATION OF WOUND VAC;  Surgeon: BConrad Irwinton MD;  [place] [facility]  Service: Vascular;  Laterality: Right;  . CARDIAC CATHETERIZATION  03/19/10  .  CARDIAC CATHETERIZATION N/A 02/22/2016   Procedure: Left Heart Cath and Cors/Grafts Angiography;  Surgeon: Jettie Booze, MD;  Location: [place] CV LAB;  Service: Cardiovascular;  Laterality: N/A;  . CARDIAC CATHETERIZATION N/A 02/22/2016   Procedure: Coronary Stent Intervention;  Surgeon: Jettie Booze, MD;  Location: [place] CV LAB;  Service: Cardiovascular;  Laterality: N/A;  . CARDIAC CATHETERIZATION    . COLONOSCOPY WITH PROPOFOL N/A 10/24/2015   Procedure: COLONOSCOPY WITH PROPOFOL;  Surgeon: Milus Banister, MD;  Location: [facility];  Service: Endoscopy;  Laterality: N/A;  . CORONARY ANGIOPLASTY  2002   3 stents  . CORONARY ARTERY BYPASS GRAFT  03/2000   quadruple  . ENDARTERECTOMY FEMORAL Right 03/25/2013    Procedure: ENDARTERECTOMY FEMORAL ;  Surgeon: Conrad [place], MD;  Location: [place];  Service: Vascular;  Laterality: Right;  . EYE SURGERY Right    Catarct  . HERNIA REPAIR     Hiatal Hernia  . I&D EXTREMITY Right 04/14/2013   Procedure: IRRIGATION AND DEBRIDEMENT OF RIGHT  GROIN & PLACEMENT OF VAC DRESSING;  Surgeon: Conrad Union, MD;  Location: [place];  Service: Vascular;  Laterality: Right;  . I&D EXTREMITY Right 04/20/2014   Procedure: DEBRIDEMENT OF RIGHT BELOW KNEE AMPUTATION STUMP;  Surgeon: Conrad Jim Hogg, MD;  Location: [place];  Service: Vascular;  Laterality: Right;  . LOWER EXTREMITY ANGIOGRAM Bilateral 04/30/2012   Procedure: LOWER EXTREMITY ANGIOGRAM;  Surgeon: Conrad La Honda, MD;  [facility] C[facility];  Service: Cardiovascular;  Laterality: Bilateral;  bilat lower extrem angio  . LOWER EXTREMITY ANGIOGRAPHY N/A 07/25/2016   Procedure: Lower Extremity Angiography;  Surgeon: Conrad [place], MD;  Location: [place] CV LAB;  Service: Cardiovascular;  Laterality: N/A;  . PATCH ANGIOPLASTY Right 03/25/2013   Procedure: PATCH ANGIOPLASTY USING VASCU-GUARD PERIPHERAL VASCULAR PATCH;  Surgeon: Conrad Wade, MD;  Location: [place];  Service: Vascular;  Laterality: Right;  . PERIPHERAL VASCULAR BALLOON ANGIOPLASTY  07/25/2016   Procedure: Peripheral Vascular Balloon Angioplasty;  Surgeon: Conrad Hubbardston, MD;  Location: [place] CV LAB;  Service: Cardiovascular;;  . PERIPHERAL VASCULAR INTERVENTION  07/25/2016   Procedure: Peripheral Vascular Intervention;  Surgeon: Conrad Seaside, MD;  Location: [place] CV LAB;  Service: Cardiovascular;;  . revasculariztion  2011/2010   x 2   . TONSILLECTOMY    . WOUND DEBRIDEMENT Right 04/20/2014   s/p  rt bka  with application of wound vac     Current Outpatient Prescriptions  Medication Sig Dispense Refill  . albuterol (PROVENTIL HFA;VENTOLIN HFA) 108 (90 Base) MCG/ACT inhaler Inhale 2 puffs into the lungs every 6 (six) hours as needed for wheezing or  shortness of breath. 1 Inhaler 0  . benzonatate (TESSALON) 100 MG capsule Take 100 mg by mouth 3 (three) times daily as needed for cough.    . carvedilol (COREG) 3.125 MG tablet Take 1 tablet (3.125 mg total) by mouth 2 (two) times daily with a meal. 60 tablet 0  . clopidogrel (PLAVIX) 75 MG tablet Take 1 tablet (75 mg total) by mouth daily. 90 tablet 1  . docusate sodium (COLACE) 100 MG capsule Take 1 capsule (100 mg total) by mouth 2 (two) times daily. 10 capsule 0  . furosemide (LASIX) 40 MG tablet Take 1 tablet (40 mg total) by mouth daily. (Patient taking differently: Take 40 mg by mouth daily. May take an additional 12m daily as needed for weight gain of 3lbs in 24 hours or 5lbs  in a week) 30 tablet 0  . hydrALAZINE (APRESOLINE) 25 MG tablet Take 1.5 tablets (37.5 mg total) by mouth every 8 (eight) hours. 90 tablet 0  . insulin aspart (NOVOLOG) 100 UNIT/ML injection Inject 0-15 Units into the skin 3 (three) times daily with meals. (Patient taking differently: Inject 3-15 Units into the skin 3 (three) times daily with meals. Per sliding scale: 101-150 = 3 units, 151-200 = 4 units, 201-250 = 7 units, 251-300 = 9 units, 301-350 = 12 units, >350 = 15 units, call MD for BS > 400 or < 60) 10 mL 11  . isosorbide mononitrate (IMDUR) 30 MG 24 hr tablet Take 1 tablet (30 mg total) by mouth daily. 30 tablet 0  . levalbuterol (XOPENEX) 0.63 MG/3ML nebulizer solution Take 0.63 mg by nebulization every 6 (six) hours as needed for wheezing or shortness of breath.    . Multiple Vitamins-Minerals (DECUBI-VITE) CAPS Take 1 capsule by mouth daily.    . pantoprazole (PROTONIX) 40 MG tablet Take 1 tablet (40 mg total) by mouth daily at 12 noon. 30 tablet 0  . potassium chloride (K-DUR) 10 MEQ tablet Take 1 tablet (10 mEq total) by mouth daily. (Patient taking differently: Take 10 mEq by mouth daily. May take an additional 43mq daily as needed for weight gain of 3lbs in 24 hours or 5lbs in a week) 30 tablet 0  .  rivaroxaban (XARELTO) 20 MG TABS tablet Take 1 tablet (20 mg total) by mouth daily with supper. 30 tablet 0  . rosuvastatin (CRESTOR) 20 MG tablet Take 20 mg by mouth daily.    . vitamin C (ASCORBIC ACID) 500 MG tablet Take 500 mg by mouth 2 (two) times daily.    .Marland Kitchenzinc sulfate 220 (50 Zn) MG capsule Take 220 mg by mouth daily.     No current facility-administered medications for this visit.     Allergies:   Statins; Amlodipine besylate; and Cephalexin    ROS:  Please see the history of present illness.   Otherwise, review of systems are positive for {NONE DEFAULTED:18576::"none"}.   All other systems are reviewed and negative.    PHYSICAL EXAM: VS:  There were no vitals taken for this visit. , BMI There is no height or weight on file to calculate BMI. GENERAL:  Well appearing HEENT:  Pupils equal round and reactive, fundi not visualized, oral mucosa unremarkable NECK:  No jugular venous distention, waveform within normal limits, carotid upstroke brisk and symmetric, no bruits, no thyromegaly LYMPHATICS:  No cervical, inguinal adenopathy LUNGS:  Clear to auscultation bilaterally BACK:  No CVA tenderness CHEST:  Unremarkable HEART:  PMI not displaced or sustained,S1 and S2 within normal limits, no S3, no S4, no clicks, no rubs, *** murmurs ABD:  Flat, positive bowel sounds normal in frequency in pitch, no bruits, no rebound, no guarding, no midline pulsatile mass, no hepatomegaly, no splenomegaly EXT:  2 plus pulses throughout, no edema, no cyanosis no clubbing SKIN:  No rashes no nodules NEURO:  Cranial nerves II through XII grossly intact, motor grossly intact throughout PSYCH:  Cognitively intact, oriented to person place and time    EKG:  EKG {ACTION; IS/IS NFBP:[id number]}ordered today. The ekg ordered today demonstrates ***   Recent Labs: 06/03/2016: Magnesium 1.7; TSH 1.582 06/04/2016: ALT 29 06/07/2016: B Natriuretic Peptide 457.2 07/27/2016: BUN 15; Creatinine, Ser 1.30;  Hemoglobin 10.5; Platelets 153; Potassium 3.9; Sodium 138    Lipid Panel    Component Value Date/Time   CHOL  209 (H) 05/23/2015 0528   TRIG 108 05/23/2015 0528   HDL 45 05/23/2015 0528   CHOLHDL 4.6 05/23/2015 0528   VLDL 22 05/23/2015 0528   LDLCALC 142 (H) 05/23/2015 0528   LDLDIRECT 209.0 09/26/2014 0951      Wt Readings from Last 3 Encounters:  07/25/16 262 lb (118.8 kg)  07/19/16 265 lb (120.2 kg)  07/03/16 248 lb (112.5 kg)      Other studies Reviewed: Additional studies/ records that were reviewed today include: ***. Review of the above records demonstrates:  Please see elsewhere in the note.  ***   ASSESSMENT AND PLAN:   Acute on Chronic systolic heart failure -EF now 10%, down from 15%. Although his ejection fraction has fallen over the past year, Per Dr. Aundra Dubin, he is not a LVAD candidate -Metoprolol has been switched to coreg. No ACE-I/ARB due to low BP and elevated SCr - Continue Lasix for volume control. SNF was advised to continue to check daily weights and to give extra lasix tablet if >3lb weight gain in 24 hr or > 5 lb weight gain in 1 week.  - low sodium diet   CAD -S/P CABG 2001. NSTEMI and occluded LIMA to LAD- PTCA to LAD in 02/2016 with only modest improvement. He has known Occlusion of the native RCA and occlusion of the SVG to RCA.  - he denies angina - Continue medical therapy: Plavix, beta blocker, statin - No ARB/ACE give renal dysfunction  Paroxysmal atrial fibrillation -Maintaining sinus rhythm on beta blocker -CHA2DS2-VASc Score is 6 (CHF, age, stroke (2), DM, Vascular disease). He takes Xarelto for stroke risk reduction  PVD: s/p bilateral BKAs. Followed by Dr. Bridgett Larsson. He may require redo right femoral endarterectomy with extended profundaplasy and bilateral above-knee amputations. He has f/u with Dr. Bridgett Larsson 4/13 to further discuss.   Recent Acute encephalopathy, likely drug-induced -- [facility] at [facility] with Narcan.  CT head showed remote  infarct but no acute findings - EEG did not show seizure activity - he is now at SNF, meds monitored    Current medicines are reviewed at length with the patient today.  The patient {ACTIONS; HAS/DOES NOT HAVE:19233} concerns regarding medicines.  The following changes have been made:  {PLAN; NO CHANGE:13088:s}  Labs/ tests ordered today include: *** No orders of the defined types were placed in this encounter.    Disposition:   FU with ***    Signed, Minus Breeding, MD  09/23/2016 7:31 AM    [facility]

## 2016-09-24 ENCOUNTER — Ambulatory Visit: Payer: Medicare HMO | Admitting: Cardiology
